# Patient Record
Sex: Female | Born: 1950 | Race: White | Hispanic: No | Marital: Single | Smoking: Never smoker
Health system: Southern US, Community
[De-identification: ages and names within clinical notes are randomized; demographics above are authoritative.]

## PROBLEM LIST (undated history)

## (undated) DIAGNOSIS — I519 Heart disease, unspecified: Secondary | ICD-10-CM

## (undated) DIAGNOSIS — E1122 Type 2 diabetes mellitus with diabetic chronic kidney disease: Secondary | ICD-10-CM

## (undated) DIAGNOSIS — H35039 Hypertensive retinopathy, unspecified eye: Secondary | ICD-10-CM

## (undated) DIAGNOSIS — E1140 Type 2 diabetes mellitus with diabetic neuropathy, unspecified: Secondary | ICD-10-CM

## (undated) DIAGNOSIS — E119 Type 2 diabetes mellitus without complications: Secondary | ICD-10-CM

## (undated) DIAGNOSIS — J45909 Unspecified asthma, uncomplicated: Secondary | ICD-10-CM

## (undated) DIAGNOSIS — I10 Essential (primary) hypertension: Secondary | ICD-10-CM

## (undated) DIAGNOSIS — M1990 Unspecified osteoarthritis, unspecified site: Secondary | ICD-10-CM

## (undated) DIAGNOSIS — N2889 Other specified disorders of kidney and ureter: Secondary | ICD-10-CM

## (undated) DIAGNOSIS — L97519 Non-pressure chronic ulcer of other part of right foot with unspecified severity: Secondary | ICD-10-CM

## (undated) DIAGNOSIS — E785 Hyperlipidemia, unspecified: Secondary | ICD-10-CM

## (undated) DIAGNOSIS — E1165 Type 2 diabetes mellitus with hyperglycemia: Secondary | ICD-10-CM

## (undated) DIAGNOSIS — L97509 Non-pressure chronic ulcer of other part of unspecified foot with unspecified severity: Secondary | ICD-10-CM

## (undated) DIAGNOSIS — E1129 Type 2 diabetes mellitus with other diabetic kidney complication: Secondary | ICD-10-CM

## (undated) DIAGNOSIS — E11319 Type 2 diabetes mellitus with unspecified diabetic retinopathy without macular edema: Secondary | ICD-10-CM

## (undated) DIAGNOSIS — G709 Myoneural disorder, unspecified: Secondary | ICD-10-CM

## (undated) DIAGNOSIS — IMO0002 Reserved for concepts with insufficient information to code with codable children: Secondary | ICD-10-CM

## (undated) DIAGNOSIS — I472 Ventricular tachycardia: Secondary | ICD-10-CM

## (undated) DIAGNOSIS — E1121 Type 2 diabetes mellitus with diabetic nephropathy: Secondary | ICD-10-CM

## (undated) DIAGNOSIS — K219 Gastro-esophageal reflux disease without esophagitis: Secondary | ICD-10-CM

## (undated) DIAGNOSIS — J189 Pneumonia, unspecified organism: Secondary | ICD-10-CM

## (undated) DIAGNOSIS — E11621 Type 2 diabetes mellitus with foot ulcer: Secondary | ICD-10-CM

## (undated) DIAGNOSIS — N183 Chronic kidney disease, stage 3 (moderate): Secondary | ICD-10-CM

## (undated) DIAGNOSIS — C801 Malignant (primary) neoplasm, unspecified: Secondary | ICD-10-CM

## (undated) DIAGNOSIS — D631 Anemia in chronic kidney disease: Secondary | ICD-10-CM

## (undated) DIAGNOSIS — Z992 Dependence on renal dialysis: Secondary | ICD-10-CM

## (undated) DIAGNOSIS — H269 Unspecified cataract: Secondary | ICD-10-CM

## (undated) DIAGNOSIS — N189 Chronic kidney disease, unspecified: Secondary | ICD-10-CM

## (undated) DIAGNOSIS — D649 Anemia, unspecified: Secondary | ICD-10-CM

## (undated) DIAGNOSIS — R809 Proteinuria, unspecified: Secondary | ICD-10-CM

## (undated) DIAGNOSIS — I2510 Atherosclerotic heart disease of native coronary artery without angina pectoris: Secondary | ICD-10-CM

## (undated) DIAGNOSIS — N289 Disorder of kidney and ureter, unspecified: Secondary | ICD-10-CM

## (undated) DIAGNOSIS — E133299 Other specified diabetes mellitus with mild nonproliferative diabetic retinopathy without macular edema, unspecified eye: Secondary | ICD-10-CM

## (undated) DIAGNOSIS — Z5189 Encounter for other specified aftercare: Secondary | ICD-10-CM

## (undated) DIAGNOSIS — F329 Major depressive disorder, single episode, unspecified: Secondary | ICD-10-CM

## (undated) DIAGNOSIS — C649 Malignant neoplasm of unspecified kidney, except renal pelvis: Secondary | ICD-10-CM

## (undated) DIAGNOSIS — F32A Depression, unspecified: Secondary | ICD-10-CM

## (undated) DIAGNOSIS — N186 End stage renal disease: Secondary | ICD-10-CM

## (undated) HISTORY — DX: End stage renal disease: N18.6

## (undated) HISTORY — DX: Type 2 diabetes mellitus with foot ulcer: E11.621

## (undated) HISTORY — DX: Type 2 diabetes mellitus with diabetic neuropathy, unspecified: E11.40

## (undated) HISTORY — DX: Type 2 diabetes mellitus with unspecified diabetic retinopathy without macular edema: E11.319

## (undated) HISTORY — DX: Anemia in chronic kidney disease: D63.1

## (undated) HISTORY — DX: Reserved for concepts with insufficient information to code with codable children: IMO0002

## (undated) HISTORY — DX: Chronic kidney disease, unspecified: N18.9

## (undated) HISTORY — PX: TOE AMPUTATION: SHX809

## (undated) HISTORY — DX: Hyperlipidemia, unspecified: E78.5

## (undated) HISTORY — PX: EYE SURGERY: SHX253

## (undated) HISTORY — DX: Type 2 diabetes mellitus with other diabetic kidney complication: E11.29

## (undated) HISTORY — DX: End stage renal disease: Z99.2

## (undated) HISTORY — DX: Chronic kidney disease, stage 3 (moderate): N18.3

## (undated) HISTORY — DX: Encounter for other specified aftercare: Z51.89

## (undated) HISTORY — DX: Atherosclerotic heart disease of native coronary artery without angina pectoris: I25.10

## (undated) HISTORY — DX: Essential (primary) hypertension: I10

## (undated) HISTORY — DX: Type 2 diabetes mellitus with hyperglycemia: E11.21

## (undated) HISTORY — DX: Malignant neoplasm of unspecified kidney, except renal pelvis: C64.9

## (undated) HISTORY — DX: Unspecified cataract: H26.9

## (undated) HISTORY — DX: Type 2 diabetes mellitus with hyperglycemia: E11.65

## (undated) HISTORY — DX: Hypertensive retinopathy, unspecified eye: H35.039

## (undated) HISTORY — DX: Malignant (primary) neoplasm, unspecified: C80.1

## (undated) HISTORY — DX: Type 2 diabetes mellitus with foot ulcer: L97.519

## (undated) HISTORY — DX: Non-pressure chronic ulcer of other part of unspecified foot with unspecified severity: L97.509

## (undated) HISTORY — PX: CATARACT EXTRACTION: SUR2

## (undated) HISTORY — DX: Other specified diabetes mellitus with mild nonproliferative diabetic retinopathy without macular edema, unspecified eye: E13.3299

## (undated) HISTORY — DX: Ventricular tachycardia: I47.2

## (undated) HISTORY — DX: Heart disease, unspecified: I51.9

## (undated) HISTORY — DX: Other specified disorders of kidney and ureter: N28.89

## (undated) HISTORY — DX: Proteinuria, unspecified: R80.9

## (undated) HISTORY — DX: Type 2 diabetes mellitus with diabetic chronic kidney disease: E11.22

---

## 1998-10-28 ENCOUNTER — Emergency Department (HOSPITAL_COMMUNITY): Admission: EM | Admit: 1998-10-28 | Discharge: 1998-10-28 | Payer: Self-pay | Admitting: Emergency Medicine

## 1998-10-28 ENCOUNTER — Encounter: Payer: Self-pay | Admitting: *Deleted

## 2000-06-24 ENCOUNTER — Encounter: Admission: RE | Admit: 2000-06-24 | Discharge: 2000-09-22 | Payer: Self-pay | Admitting: Family Medicine

## 2001-03-03 ENCOUNTER — Encounter (INDEPENDENT_AMBULATORY_CARE_PROVIDER_SITE_OTHER): Payer: Self-pay | Admitting: Specialist

## 2001-03-03 ENCOUNTER — Inpatient Hospital Stay (HOSPITAL_COMMUNITY): Admit: 2001-03-03 | Discharge: 2001-03-07 | Payer: Self-pay | Admitting: Family Medicine

## 2001-05-19 ENCOUNTER — Encounter (INDEPENDENT_AMBULATORY_CARE_PROVIDER_SITE_OTHER): Payer: Self-pay

## 2001-05-19 ENCOUNTER — Ambulatory Visit (HOSPITAL_COMMUNITY): Admission: RE | Admit: 2001-05-19 | Discharge: 2001-05-19 | Payer: Self-pay | Admitting: General Surgery

## 2001-07-23 ENCOUNTER — Encounter: Admission: RE | Admit: 2001-07-23 | Discharge: 2001-10-05 | Payer: Self-pay | Admitting: Internal Medicine

## 2001-10-14 ENCOUNTER — Encounter: Admission: RE | Admit: 2001-10-14 | Discharge: 2001-10-23 | Payer: Self-pay | Admitting: Internal Medicine

## 2001-11-04 ENCOUNTER — Encounter (HOSPITAL_BASED_OUTPATIENT_CLINIC_OR_DEPARTMENT_OTHER): Admission: RE | Admit: 2001-11-04 | Discharge: 2002-02-02 | Payer: Self-pay | Admitting: Orthopedic Surgery

## 2002-02-03 ENCOUNTER — Encounter (HOSPITAL_BASED_OUTPATIENT_CLINIC_OR_DEPARTMENT_OTHER): Admission: RE | Admit: 2002-02-03 | Discharge: 2002-02-05 | Payer: Self-pay | Admitting: Internal Medicine

## 2005-10-15 ENCOUNTER — Emergency Department (HOSPITAL_COMMUNITY): Admission: EM | Admit: 2005-10-15 | Discharge: 2005-10-15 | Payer: Self-pay | Admitting: Emergency Medicine

## 2006-12-12 ENCOUNTER — Inpatient Hospital Stay (HOSPITAL_COMMUNITY): Admission: EM | Admit: 2006-12-12 | Discharge: 2006-12-17 | Payer: Self-pay | Admitting: Emergency Medicine

## 2010-12-04 NOTE — H&P (Signed)
NAMEELLANORA, TANIS                 ACCOUNT NO.:  [id number]   MEDICAL RECORD NO.:  [id number]          PATIENT TYPE:  EMS   LOCATION:  MAJO                         FACILITY:  [facility]   PHYSICIAN:  Corinna L. Conley Canal, MDDATE OF BIRTH:  [birth year]   DATE OF ADMISSION:  12/12/2006  DATE OF DISCHARGE:                              HISTORY & PHYSICAL   Please note this is a STAT H&P, not a discharge summary.   CHIEF COMPLAINT:  Right foot redness and swelling.   HISTORY OF PRESENT ILLNESS:  Ms. Sollazzo is a pleasant 60 year old  diabetic patient of Dr. Harlan Stains, who also sees Dr. Blenda Mounts for a  foot ulcer.  She has had a right foot plantar ulcer for the past three  years.  She saw Dr. Blenda Mounts in his office two days ago.  X-rays were done  and a deep culture was taken.  I have a call into [facility] for the  identification and sensitivity.  They are to call me back with the  result.  The patient noticed that her entire right foot became more  swollen.  It also started involving her leg.  She has also had chills.  Her sugars have been higher than usual.  Apparently, she called Dr.  Erasmo Downer office and was told to present to the emergency room for IV  antibiotics.  I discussed the case with Dr. Blenda Mounts and he does not  recall that she had a fracture on x-ray that was taken in the office but  he does not have her chart in front of him currently.   PAST MEDICAL HISTORY:  1. Type 2 diabetes.  2. Asthma.  3. Obesity.  4. 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   MEDICATIONS:  1. Actos 30 mg a day.  2. Amaryl dose unknown.  3. Metformin 1,000 mg p.o. b.i.d.  4. Keflex and Cipro for three days.  5. She takes two inhalers one is a maintenance inhaler.  One is a      rescue inhaler but she does not know their names.   ALLERGIES:  PENICILLIN.   FAMILY HISTORY:  Her mother had diabetes.  Her father had heart disease.  Her brother has skin cancer.   And her other brother has a tumor behind  his eye.   SOCIAL HISTORY:  The patient does not smoke.  She drinks occasionally.  No drug abuse.  She is single and lives alone.  She is a Air traffic controller and  delivers newspapers.   PAST SURGICAL HISTORY:  As above.   REVIEW OF SYSTEMS:  As above otherwise negative.   PHYSICAL EXAMINATION:  VITAL SIGNS:  Her temperature is 98, blood  pressure 125/96, pulse 107, respiratory rate 18, oxygen saturation 93%  on room air.  GENERAL:  The patient is an obese, white female in no acute distress.  She is currently in the hallway and therefore, it is difficult to get a  full examination on her.  HEENT:  Normocephalic atraumatic.  Pupils are equal, round, and  reactive  to light.  She has moist mucous membranes.  NECK:  Supple.  She has a thick neck.  She has no carotid bruits.  No  thyromegaly.  No lymphadenopathy.  LUNGS:  Clear to auscultation bilaterally without wheezes, rhonchi, or  rales.  CARDIOVASCULAR:  Regular rate and rhythm without murmurs, gallops, or  rubs.  ABDOMEN:  Obese, soft, nontender.  GU:  Deferred.  RECTAL:  Deferred.  EXTREMITIES:  Her right foot has an ulcer at the plantar surface just  proximal to the great toe which appears clean and looks to go down to  bone.  There is no drainage.  She has swelling and redness and warmth  involving her entire foot as well as her distal leg.  There is also  erythema that tracks up medially to near the groin.  SKIN:  See above.  No rash.  PSYCHIATRIC:  Normal affect.  NEUROLOGIC:  Alert and oriented.  Cranial nerve and sensory motor exams  are intact.   LABORATORY:  CBC is unremarkable.  Basic metabolic panel is significant  for a glucose of 264, otherwise unremarkable.  X-ray of the right foot,  wet reading shows an acute fracture of the great toe at the proximal  phalanx extending to the articular surface.  Official report is pending.   ASSESSMENT/PLAN:  1. Right diabetic foot ulcer  with fracture of the great toe and      cellulitis of the foot and leg.  The patient is penicillin allergic      but will need broad spectrum antibiotics including coverage of      MRSA.  I will start vancomycin, ciprofloxacin, and Flagyl.  I have      discussed the case with Dr. Blenda Mounts who agrees to consult this      weekend.  I will get an MRI.  I suspect she has osteomyelitis.  She      will need to be strict non-weightbearing to that foot and it will      need to be elevated.  I will await a call back from [facility]      with respect to the wound culture that was done in Dr. Erasmo Downer      office.  2. Type 2 diabetes.  I will continue Actos and Metformin.  She does      not know her Amaryl dose.  I will start with 4 mg.  I will also      check a hemoglobin A1c.  3. Asthma.  She does not recall her inhaler but I will give Advair and      albuterol.  4. Obesity.  5. She will also get deep vein thrombosis prophylaxis.      Corinna L. Conley Canal, MD  Electronically Signed     CLS/MEDQ  D:  12/12/2006  T:  12/12/2006  Job:  [id number]   cc:   Emeline General. Dema Severin, M.D.  Harriet Masson, D.P.M.

## 2010-12-04 NOTE — Op Note (Signed)
NAME:  [name], [name] NO.:  [id number]   MEDICAL RECORD NO.:  [id number]          PATIENT TYPE:  INP   LOCATION:  6706                         FACILITY:  [facility]   PHYSICIAN:  Harriet Masson, D.P.M. DATE OF BIRTH:  March 08, 1951   DATE OF PROCEDURE:  12/16/2006  DATE OF DISCHARGE:                               OPERATIVE REPORT   SURGEON:  Harriet Masson, D.P.M.   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   PROCEDURE PERFORMED:  Plan for amputation, right great toe at the level  of the metatarsophalangeal joint.   INDICATIONS FOR PROCEDURE:  The patient has had a greater than 3-year  history of non resolving or recalcitrant ulceration of her right great  toe.  Most recently has had a flare-up with cellulitis extending up to  the foot and leg.  Has responded to the antibiotic; however, continues  to have severe edema and erythema with local calor to the right hallux  still being present.  There is still open ulceration approximately a  centimeter in diameter on the plantar aspect of the hallux under the IP  joint.  X-rays have confirmed pathologic fracturing of the IP joint, and  no history of trauma noted.  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  Surgery to proceed as scheduled.   ANESTHESIA:  Managed anesthesia care with local anesthetic and ankle  block provided by anesthesia.   HEMOSTASIS:  Use of right ankle tourniquet 250 mm Hg.   FINDINGS AND DESCRIPTION OF PROCEDURE:  The patient was brought into the  operating room and placed on the table in the supine  position.  IV  sedation was established.  A local anesthetic block had been given in  preop.  The foot was prepped and draped in the usual aseptic manner.  The ankle tourniquet was placed above the level of the malleoli and  padded well to prevent contusion.  The foot was exsanguinated via an  Esmarch wrap and the ankle tourniquet was inflated to 250 mm Hg.  The  following procedure was then carried down.  Again, the foot was  thoroughly prepped.   AMPUTATION OF THE FIRST TOE, RIGHT FOOT:  Attention was now directed to  the dorsomedial aspect of the right foot overlying the first MTP joint.  It showed no plantar ulceration.  The hallux was identified.  At this  time utilizing a tennis racquet circumferential incision, some of the  incisions were made from medial to lateral across the dorsum and across  the plantar aspect of the base of the hallux just proximal to the  ulcerative site.  At this time the incisions were deepened via sharp and  blunt dissection to the level of the underlying capsular structures and  neurovascular  bundles which were identified, incised and ligated  utilizing Bovie for cautery.  At this time a capsular incision was made  over the dorsal and medial aspects of the first MTP joint.  The joint  was entered and disarticulated and at this time the entire toe with  ulcer left intact was disarticulated and submitted in formalin for  pathology analysis.  The articular surface of the first metatarsal was  identified at this time.  Utilizing prior instrumentation, the articular  surface was resected perpendicular to its shaft.  All the rough edges  were smoothed and rongeured.  Should note the sesamoids and plantar  capsule were left intact and noted not to be excessively hypertrophied.   At this time the site was cultured, both aerobic and anaerobic cultures  of the operative site were carried out and to be compared with  preoperative culturing.  At this time the site  was thoroughly lavaged  utilizing the pulse lavage system.  Capsular tissues were then  reapproximated overlying the distal stump of the first metatarsal  utilizing 3-0 Vicryl and the skin reapproximated with 3-0 nylon in a  simple interrupted fashion.  Closure was done over a quarter-inch  Penrose drain which did exit medially along the incision area.  At this  time the site was thoroughly cleansed with Betadine.  A saline-soaked  sponge and a dry sterile compressive dressing were applied to the right  foot with the Penrose drain intact.  The right ankle tourniquet was  deflated with immediate return of perfusion to all the remaining lesser  digits.   The patient was returned from the operating room to recovery in  satisfactory condition and discharged back to her 6th floor on the  service of Dr. Dillard Essex for medical management.  The patient will  hopefully be able to be discharged with either IV or p.o. antibiotics  within the next day or two if recovery proceeds as expected.  Again, the  patient is stable, returning to recovery following the successful  surgery, amputation of the right hallux.           ______________________________  Harriet Masson, D.P.M.     RS/MEDQ  D:  12/16/2006  T:  12/16/2006  Job:  [id number]

## 2010-12-07 NOTE — Op Note (Signed)
[facility]  Patient:    [name], [name]                     MRN: [id number] Proc. Date: 03/04/01 Adm. Date:  [id number] Disc. Date: [id number] Attending:  Gelene Mink                           Operative Report  PREOPERATIVE DIAGNOSES:  Diabetes and gangrene of left great toe.  SURGICAL PROCEDURE:  Transphalangeal amputation of left great toe.  SURGEON:  Darene Lamer. Hoxworth, M.D.  ANESTHESIA:  General.  BRIEF HISTORY:  [name] is a 60 year old diabetic female, who presents with a several week history of ulceration on her left great toe and then foul-smelling drainage, redness, and swelling of her foot.  Examination revealed ulceration of essentially the entire pad of the left great toe with wet, gangrenous changes of proximal pad, extending down to the bone. Amputation of the toe with leaving the wound open has been recommended and accepted.  The nature of the procedure, its indications, and risks of nonhealing and further amputation have been discussed and understood with the patient and her family.  She is brought to the operating room for this procedure.  DESCRIPTION OF PROCEDURE:  The patient was brought to the operating room, placed in the supine position on the operating table, and general endotracheal anesthesia was induced.  The left foot was sterilely prepped and draped.  She was already on broad-spectrum antibiotics.  A fishmouth type of incision was made proximal to all of the gangrenous skin changes which was essentially the IP joint, and dissection was carried down sharply to the bone in all directions.  The distal phalanx was initially removed at the joint line and the specimen removed.  The soft tissue was sharply elevated up off the proximal phalanx which was then divided at about its mid portion with the bone cutter.  There were some further necrotic changes of the soft tissue beneath viable skin, extending along the  tendon sheath which was sharply debrided back to apparently viable tissue at approximately the MP joint.  The skin appeared healthy at this point.  The wound was copiously irrigated with warm saline. It was then packed open with moist saline gauze and dry sterile dressing applied.  Sponge, needle, and instrument counts were correct.  The patient was taken to recovery room in good condition. DD:  03/04/01 TD:  03/04/01 Job: [id number]

## 2010-12-07 NOTE — Consult Note (Signed)
[facility]  Patient:    [name], [name] Visit Number: [id number] MRN: [id number]          Service Type: FTC Location: FOOT Attending Physician:  Danny Lawless Dictated by:   Orlando Penner London Pepper, M.D. Proc. Date: 07/23/01 Admit Date:  07/23/2001   CC:         Marland Kitchen T. Hoxworth, M.D.  Emeline General Dema Severin, M.D., [facility]   Consultation Report  HISTORY:  This 60 year old white female is referred by Dr. Marland Kitchen T. Hoxworth for assistance with the management of a right foot ulcer.  The patient has had type 2 diabetes for a number of years and has had numerous foot problems related to that.  Although she is not aware of any time of active disease, there are deformities in both feet that suggest she has had some hindfoot Charcot-type process bilaterally.  In addition, she developed an ulceration and eventual osteomyelitis of the hallux on the left side and underwent amputation of the hallux and the distal phalanx of the second toe of that foot in August of this year.  Since November of this year, she had developed an ulcerated callus on the plantar aspect of the first metatarsal head of the right foot and this has not healed, despite topical treatments and also some oral antibiotics.  Recently, over the proceeding weekend while wearing some nondiabetic shoes, she developed a blister in the area of the arch of the right foot which has since unroofed itself.  She presents here now for management of these various problems.  PAST MEDICAL HISTORY:  Past medical history includes hypercholesterolemia, asthma, hypertension as well as those problems mentioned in the present illness.  ALLERGIES:  She is allergic to PENICILLIN.  REGULAR MEDICATIONS: 1. Glucophage XR 500 mg b.i.d. 2. Glucotrol XL 5 mg daily. 3. Lotensin 10 mg daily. 4. Lasix 20 mg daily. 5. Niacin uncertain dose, one daily. 6. Multivitamin one  daily.  EXAMINATION:  EXTREMITIES:  Examination today is limited to the distal lower extremities. As previously indicated, both feet show medial overriding in the talonavicular areas suggesting some previous hindfoot Charcot process, although there is no apparent activity to that at this time.  The feet are deformed by virtue of surgical amputation of the hallux and distal phalanx of the second toe of the left foot, now well-healed; in addition, there is some clawing of the toes. The feet are mildly edematous, more so on the right than on the left.  Pedal pulses are adequate.  Skin temperatures are normal and essentially symmetrical with exception of increased temperature on the right foot adjacent to the plantar ulcer and also to the superficial blistered area in the instep area. Both heels are involved by diffuse callus formation and there is a significant callus on the tip of the clawed left third toe as well.  Monofilament testing shows that she lacks protective sensation in the right foot and has marginally protective sensation on the left.  DISPOSITION: 1. The patient is given general instruction regarding foot care by video    instruction with nurse and physician reinforcement.  She seems relatively    naive about the significance of foot problems in the face of her diabetes    and does not appear eager to learn. 2. The callus on the tip of the left third toe is sharply pared away and in    fact, an underlying ulcer is revealed which measures 2 x 3 x 1 mm after  debridement. 3. The primary ulcer underlying the first metatarsal head on the right foot is    widely debrided, resulting in an ulcer 5.0 x 8.0 x 0.5 mm after    debridement. 4. The blistered first degree ulceration in the instep area of the right foot    does not require any immediate attention from the point of view of    debridement. 5. The heels bilaterally are dremeled by the nurse to deal with the diffuse     callus formation there. 6. Neosporin and a soft dressing are used to the ulcer on the tip of the left    third toe.  The ulcer and blistered areas on the right foot are both    treated with Bactroban, then covered with Allevyn pads and the right lower    extremity is placed into a total contact cast. 7. It is discussed with the patient that once we can get these lesions healed,    she will need custom-molded shoes bilaterally. 8. Followup visit will be to this clinic in six days. Dictated by:   Orlando Penner London Pepper, M.D. Attending Physician:  Danny Lawless DD:  07/23/01 TD:  07/23/01 Job: [id number] [id number]

## 2010-12-07 NOTE — Discharge Summary (Signed)
[name], [name]                 ACCOUNT NO.:  [id number]   MEDICAL RECORD NO.:  [id number]          PATIENT TYPE:  INP   LOCATION:  6706                         FACILITY:  [facility]   PHYSICIAN:  Sheila Oats, M.D.DATE OF BIRTH:  17-Apr-1951   DATE OF ADMISSION:  12/12/2006  DATE OF DISCHARGE:  12/17/2006                               DISCHARGE SUMMARY   DISCHARGE DIAGNOSES:  1. Diabetic foot ulcer with fractures, cellulitis, and ? osteomyelitis      -- status post amputation of right great toe at the metatarsal      phalangeal joint.  2. Diabetes mellitus.  3. History of asthma.  4. Obesity.  5. History of left great toe amputation and partial amputation of the      left second toe.   CONSULTATION:  Podiatry -- Harriet Masson, D.P.M.   PROCEDURES AND STUDIES:  1. MRI of the right foot -- fractures of the proximal and distal      phalanges of the great toe with fractures extending into the      interphalangeal joint and within the interphalangeal joint      effusion.  The diffuse edema signaled an enhancement within the      phalanges could be due to fractures or due to osteomyelitis,      surrounding soft tissue edema noted.  Extensive hallucis longus      tenosynovitis and old metatarsal fracture is noted.  2. Amputation of the right great toe at the metatarsal phalangeal      joint per Harriet Masson, D.P.M.   BRIEF HISTORY:  The patient is a 60 year old diabetic female who  presented with complaints of right foot, redness and swelling.  She  reported that she had had a right foot plantar ulcer for the past 3  years; and last saw Dr. Blenda Mounts in his office, 2 days prior to her  admission.  She reports that at that time, she had x-rays done and  cultures done as well.  The patient stated that she noticed that her  entire right foot became more swollen, and began to involve her leg as  well.  Also, she developed chills, and her sugars had been higher than  normal.  She  called Dr. Erasmo Downer office and was told to go to the ER for  IV antibiotics.   Please see the admission history and physical dictated on Dec 12, 2006  by Dr. Conley Canal for the full details of the admission physical, as well  as the laboratory data.   HOSPITAL COURSE BY PROBLEM LIST:  Problem #1:  RIGHT DIABETIC FOOT ULCER  WITH CELLULITIS AND PROBABLE OSTEOMYELITIS.  Upon admission, the patient  was started on empiric antibiotics including coverage for MRSA.  Dr.  Blenda Mounts was consulted and he saw the patient.  An MRI was also obtained  and the results are as stated above.  The patient was afebrile on  antibiotics in the hospital; and the erythema and swelling were  improving.  Dr. Blenda Mounts saw the patient and recommended amputation of the  right great toe; and after the  discussion with the patient, she agreed  to this procedure; and it was done on Dec 16, 2006.  The patient  tolerated the procedure well.  Cultures obtained at the office on Dec 10, 2006, showed Staphylococcus aureus that was sensitive to all the  antibiotics, except penicillin.  The IV antibiotics were discontinued;  and the patient discharged home on Keflex.  She was to follow up with  Dr. Blenda Mounts and also her primary care physician, Dr. Harlan Stains.   Problem #2:  DIABETES MELLITUS.  The patient had a hemoglobin A1c done;  and it was 10.5.  She was on a diabetic diet and also on Amaryl and  Actos as well as sliding-scale coverage and her Glucophage during her  hospital stay.  She is to continue this with close monitoring of her  blood sugars upon discharge.   Problem #3:  HISTORY OF ASTHMA.  Remained stable throughout the hospital  stay.  She was maintained on her bronchodilators.   DISCHARGE MEDICATIONS:  Patient to continue her Keflex, Actos, Amaryl,  and metformin, as previously, as well as her bronchodilators.   FOLLOWUP CARE:  1. Dr. Blenda Mounts as scheduled.  2. Dr. Harlan Stains in 2-4 weeks.   DISCHARGE  CONDITION:  Improved and stable.   DISCHARGE DIET:  Diabetic diet.   SPECIAL INSTRUCTIONS:  Nonweightbearing to the right foot.      Sheila Oats, M.D.  Electronically Signed     ACV/MEDQ  D:  03/05/2007  T:  03/05/2007  Job:  [id number]   cc:   [facility] General. Dema Severin, M.D.  Harriet Masson, D.P.M.

## 2010-12-07 NOTE — Discharge Summary (Signed)
[facility]  Patient:    [name], [name] Visit Number: [id number] MRN: [id number]          Service Type: MED Location: [phone] Attending Physician:  Joette Catching T Adm. Date:  [id number] Disc. Date: [id number]   CC:         [place] T. Hoxworth, M.D.   Discharge Summary  DISCHARGE DIAGNOSES: 1. Left great toe ulceration leading to ray amputation. 2. Type 2 diabetes mellitus, uncontrolled. 3. Allergy to penicillin. 4. Early osteomyelitis of the left great toe secondary to #1.  DISCHARGE MEDICATIONS: 1. Flagyl 500 mg one t.i.d. for a minimum of 14 days. 2. Tequin 400 mg one a day for a minimum of 14 days. 3. Glucophage 1000 mg one tablet b.i.d. 4. Glucotrol XL 10 mg q.d. 5. Lasix 20 mg q.d. 6. Lotensin 10 mg q.d. 7. Darvocet-N one every four hours as needed for pain.  FOLLOWUP:  The patient will follow up with Dr. Excell Seltzer in his office on Thursday or Friday following discharge.  [facility] will call the patient for followup in the office for determination of ultimate antibiotic course to be coordinated with the patients surgical team.  CONSULTATION:  Dr. Excell Seltzer, general surgical service.  PROCEDURE:  Amputation of left great toe on March 04, 2001, per Dr. Excell Seltzer.  HISTORY OF PRESENT ILLNESS:  [name] is a pleasant, 60 year old female with type 2 diabetes who presented to the hospital on the date of admission with ulceration of her left great toe.  She had been following with a podiatrist who had been aggressively debriding the ulcer and treating her with p.o. Keflex, but she continued to note marked swelling about the joint with redness of the lower part of her leg and first and second toes.  When she began to develop fevers and chills, she presented to the ER for further evaluation.  X-rays in the emergency room revealed evidence of early osteomyelitis of the left great toe and  she was therefore admitted for evaluation.  HOSPITAL COURSE:  #1 - LEFT GREAT TOE DIABETIC ULCER WITH OSTEOMYELITIS:  The patient was admitted to the hospital for treatment of early osteomyelitis. Due to her penicillin allergy, she was treated initially with IV clindamycin as well as IV Tequin.  General surgery was consulted.  Wound cultures were obtained which were positive for pansensitive Serratia marcescens.  The patient tolerated her IV antibiotics without difficulty.  Due to the extent of the infection as well as the early osteomyelitis, surgery service felt that it was most appropriate to proceed with amputation of the toe.  This was accomplished without difficulty on March 04, 2001.  Arterial duplex studies from July 2002, were obtained from [facility] [address] which revealed ABIs of 1.1 at the right posterior tibial measurement and 1.0 at the dorsalis pedis on the right.  Digital pressures on the right were measured at 76 mmHg with an index of 0.59. ABIs on the left were 1.1 at the posterior tibial and 1.0 at dorsalis pedis with digital pressure on the left at 90 mmHg with an index of 0.70. This was deemed to be adequate profusion for appropriate healing.  As a result, no further vascular studies were felt to be necessary.  In the postoperative phase, the patient was transitioned to p.o. antibiotics after informal consultation with the infectious disease doctor on call.  The patient tolerated p.o. antibiotics without difficulty.  Her postoperative period was without significant complication.  She rapidly began to notice decreased edema, erythema and complete resolution of fevers after amputation of her toe. The patient was discharged home on March 07, 2001, to complete a minimum of two weeks of p.o. Flagyl and Tequin therapy for her diabetic ulcer and early osteomyelitis.  Extension of antibiotic course will be based upon wound evaluation and progression of healing.  The patient is to  follow up in the short term with Dr. Excell Seltzer with wound reevaluation.  If he feels that it is necessary to continue her antibiotics, it is recommended that these two antibiotics continue up to completion of possibly two months as needed.  If the patients wound is healing well, however, and the patient is having no systemic symptoms then two weeks could very well be adequate therapy.  The patient will also follow up with her primary care physician at [facility] for reevaluation of diabetes and to also help in determination of extent of antibiotic course.  #2 - DIABETES MELLITUS:  During hospitalization, the patients diabetes was well-controlled on Glucotrol and Glucophage alone.  No titration of her antidiabetic medications was necessary.  The patient was counseled extensively as to the relation to strict blood sugar control and appropriate wound healing.  She was instructed that she should check her blood sugars at least twice a day for the four to three days immediately following her discharge and that should her blood sugars continually run greater than 150, she should contact her primary care physician for adjustment in diabetic medications. The patient was initiated on an ACE inhibitor for renal protective effect during this hospitalization.  She tolerated Altace well, but was titrated with Lotensin by the time of discharge for financial reasons.  She is initiated on a low dose of Lotensin at 10 mg q.d. and it would be desirable to titrate this upward as the patient is able to tolerate it.  #3 - WOUND CARE:  Arrangements were made through case management for the patient to have home health assistance in dressing changes every day.  This will be continued until such time the surgical team feels it is appropriate to discontinue this. DD:  03/07/01 TD:  03/09/01 Job: [id number]

## 2010-12-07 NOTE — Op Note (Signed)
[facility]  Patient:    [name], [name] Visit Number: [id number] MRN: [id number]          Service Type: DSU Location: DAY Attending Physician:  Excell Seltzer [place] Proc. Date: 05/19/01 Admit Date:  05/19/2001                             Operative Report  PREOPERATIVE DIAGNOSIS:  Osteomyelitis left second toe.  PREOPERATIVE DIAGNOSIS:  Osteomyelitis left second toe.  SURGICAL PROCEDURE:  Interphalangeal amputation left second toe.  SURGEON:  Darene Lamer. Hoxworth, M.D.  ANESTHESIA:  Laryngeal mask.  BRIEF HISTORY:  [name] is a 60 year old white female with diabetes, who presents with osteomyelitis and worsening drainage and tissue destruction, infection of the distal phalanx of the left second great toe.  Distal toe amputation has been recommended and accepted.  The nature of the procedure, indications, risks of bleeding, infection, and nonhealing were discussed and understood.  She is now brought to the operating room for this procedure.  DESCRIPTION OF OPERATION:  The patient brought to the operating room and placed in the supine position on the operating table.  Laryngeal mask general anesthesia was induced.  She had been given antibiotics intravenously.  The left foot was sterilely prepped and draped.  A fishmouth incision at the IP joint was made and dissection carried down through the soft tissue with cautery and the toe divided at the IP joint and the specimen removed. Elevator was used to expose the proximal phalanx, and the phalangeal head was removed with the bone cutter.  The edges were rounded and smoothed with a rongeur and file.  The tissue appeared quite healthy with good blood supply. The wound was irrigated and then closed with interrupted 4-0 nylon.  Sponge, needle, and instrument counts correct.  The patient taken to recovery in good condition. Attending Physician:  Marlan Palau DD:  05/19/01 TD:   05/20/01 Job: [id number]

## 2012-11-28 ENCOUNTER — Encounter (HOSPITAL_COMMUNITY): Payer: Self-pay | Admitting: *Deleted

## 2012-11-28 ENCOUNTER — Emergency Department (HOSPITAL_COMMUNITY)
Admission: EM | Admit: 2012-11-28 | Discharge: 2012-11-29 | Disposition: A | Discharge status: Home / Self Care | Payer: Self-pay | Attending: Emergency Medicine | Admitting: Emergency Medicine

## 2012-11-28 ENCOUNTER — Emergency Department (HOSPITAL_COMMUNITY): Payer: Self-pay

## 2012-11-28 DIAGNOSIS — J40 Bronchitis, not specified as acute or chronic: Secondary | ICD-10-CM

## 2012-11-28 DIAGNOSIS — R062 Wheezing: Secondary | ICD-10-CM | POA: Insufficient documentation

## 2012-11-28 DIAGNOSIS — E119 Type 2 diabetes mellitus without complications: Secondary | ICD-10-CM | POA: Insufficient documentation

## 2012-11-28 DIAGNOSIS — Z79899 Other long term (current) drug therapy: Secondary | ICD-10-CM | POA: Insufficient documentation

## 2012-11-28 DIAGNOSIS — Z880 Allergy status to penicillin: Secondary | ICD-10-CM | POA: Insufficient documentation

## 2012-11-28 DIAGNOSIS — Z794 Long term (current) use of insulin: Secondary | ICD-10-CM | POA: Insufficient documentation

## 2012-11-28 DIAGNOSIS — J209 Acute bronchitis, unspecified: Secondary | ICD-10-CM | POA: Insufficient documentation

## 2012-11-28 DIAGNOSIS — J45909 Unspecified asthma, uncomplicated: Secondary | ICD-10-CM | POA: Insufficient documentation

## 2012-11-28 DIAGNOSIS — I10 Essential (primary) hypertension: Secondary | ICD-10-CM | POA: Insufficient documentation

## 2012-11-28 HISTORY — DX: Type 2 diabetes mellitus without complications: E11.9

## 2012-11-28 HISTORY — DX: Essential (primary) hypertension: I10

## 2012-11-28 HISTORY — DX: Unspecified asthma, uncomplicated: J45.909

## 2012-11-28 LAB — CBC WITH DIFFERENTIAL/PLATELET
Basophils Absolute: 0 10*3/uL (ref 0.0–0.1)
Basophils Relative: 0 % (ref 0–1)
Eosinophils Absolute: 0.2 10*3/uL (ref 0.0–0.7)
Eosinophils Relative: 3 % (ref 0–5)
HCT: 37.2 % (ref 36.0–46.0)
Hemoglobin: 12.7 g/dL (ref 12.0–15.0)
Lymphocytes Relative: 22 % (ref 12–46)
Lymphs Abs: 1.5 10*3/uL (ref 0.7–4.0)
MCH: 31 pg (ref 26.0–34.0)
MCHC: 34.1 g/dL (ref 30.0–36.0)
MCV: 90.7 fL (ref 78.0–100.0)
Monocytes Absolute: 0.3 10*3/uL (ref 0.1–1.0)
Monocytes Relative: 5 % (ref 3–12)
Neutro Abs: 4.8 10*3/uL (ref 1.7–7.7)
Neutrophils Relative %: 71 % (ref 43–77)
Platelets: 183 10*3/uL (ref 150–400)
RBC: 4.1 MIL/uL (ref 3.87–5.11)
RDW: 13.2 % (ref 11.5–15.5)
WBC: 6.9 10*3/uL (ref 4.0–10.5)

## 2012-11-28 LAB — COMPREHENSIVE METABOLIC PANEL
ALT: 12 U/L (ref 0–35)
AST: 9 U/L (ref 0–37)
Albumin: 3.8 g/dL (ref 3.5–5.2)
Alkaline Phosphatase: 84 U/L (ref 39–117)
BUN: 30 mg/dL — ABNORMAL HIGH (ref 6–23)
CO2: 26 mEq/L (ref 19–32)
Calcium: 9.1 mg/dL (ref 8.4–10.5)
Chloride: 107 mEq/L (ref 96–112)
Creatinine, Ser: 1.46 mg/dL — ABNORMAL HIGH (ref 0.50–1.10)
GFR calc Af Amer: 43 mL/min — ABNORMAL LOW (ref >90)
GFR calc non Af Amer: 37 mL/min — ABNORMAL LOW (ref >90)
Glucose, Bld: 246 mg/dL — ABNORMAL HIGH (ref 70–99)
Potassium: 4.2 mEq/L (ref 3.5–5.1)
Sodium: 145 mEq/L (ref 135–145)
Total Bilirubin: 0.2 mg/dL — ABNORMAL LOW (ref 0.3–1.2)
Total Protein: 7.1 g/dL (ref 6.0–8.3)

## 2012-11-28 LAB — POCT I-STAT TROPONIN I: Troponin i, poc: 0 ng/mL (ref 0.00–0.08)

## 2012-11-28 MED ORDER — ALBUTEROL SULFATE (5 MG/ML) 0.5% IN NEBU
5.0000 mg | INHALATION_SOLUTION | Freq: Once | RESPIRATORY_TRACT | Status: AC
  Administered 2012-11-28 – 2012-11-29 (×2): 5 mg via RESPIRATORY_TRACT

## 2012-11-28 NOTE — ED Notes (Signed)
Intermittent sob for x 1. Been taking Proventil inhaler "but it seems to not be helping." Ran out of Proventil today. Audible wheezing. Productive cough - yellowish/brown. No fevers, chills.

## 2012-11-29 MED ORDER — IPRATROPIUM BROMIDE 0.02 % IN SOLN
RESPIRATORY_TRACT | Status: AC
  Administered 2012-11-29: 0.5 mg via RESPIRATORY_TRACT
  Filled 2012-11-29: qty 2.5

## 2012-11-29 MED ORDER — ALBUTEROL SULFATE (5 MG/ML) 0.5% IN NEBU
INHALATION_SOLUTION | RESPIRATORY_TRACT | Status: AC
  Administered 2012-11-29: 5 mg via RESPIRATORY_TRACT
  Filled 2012-11-29: qty 1

## 2012-11-29 MED ORDER — ALBUTEROL SULFATE HFA 108 (90 BASE) MCG/ACT IN AERS: 1.0000 | INHALATION_SPRAY | Freq: Four times a day (QID) | RESPIRATORY_TRACT | Status: DC | PRN

## 2012-11-29 MED ORDER — ALBUTEROL SULFATE HFA 108 (90 BASE) MCG/ACT IN AERS
2.0000 | INHALATION_SPRAY | Freq: Four times a day (QID) | RESPIRATORY_TRACT | Status: DC
  Administered 2012-11-29: 2 via RESPIRATORY_TRACT
  Filled 2012-11-29: qty 6.7

## 2012-11-29 MED ORDER — PREDNISONE 10 MG PO TABS: 40.0000 mg | ORAL_TABLET | Freq: Every day | ORAL | Status: DC

## 2012-11-29 MED ORDER — PREDNISONE 20 MG PO TABS
60.0000 mg | ORAL_TABLET | Freq: Once | ORAL | Status: AC
  Administered 2012-11-29: 60 mg via ORAL
  Filled 2012-11-29: qty 3

## 2012-11-29 MED ORDER — ALBUTEROL SULFATE (5 MG/ML) 0.5% IN NEBU: 5.0000 mg | INHALATION_SOLUTION | Freq: Once | RESPIRATORY_TRACT | Status: DC

## 2012-11-29 MED ORDER — IPRATROPIUM BROMIDE 0.02 % IN SOLN
0.5000 mg | Freq: Once | RESPIRATORY_TRACT | Status: AC
  Administered 2012-11-29: 0.5 mg via RESPIRATORY_TRACT

## 2012-11-29 NOTE — ED Notes (Signed)
The patient is AOx4 and comfortable with her discharge instructions. 

## 2012-11-29 NOTE — ED Provider Notes (Signed)
History     CSN: [id number]  Arrival date & time 11/28/12  2237   First MD Initiated Contact with Patient 11/28/12 2347      Chief Complaint  Patient presents with  . Shortness of Breath    (Consider location/radiation/quality/duration/timing/severity/associated sxs/prior treatment) Patient is a 62 y.o. female presenting with shortness of breath. The history is provided by the patient.  Shortness of Breath Associated symptoms: wheezing   Associated symptoms: no abdominal pain, no chest pain, no fever, no headaches and no rash    Patient with intermittent shortness of breath for about a month. Gotten worse here in the past few days. Patient been using her Proventil inhaler quite frequently and then ran out today breathing got significantly worse had audible wheezing in the waiting room productive cough yellowish brown no fever no chills. Patient's oxygen saturation out in triage was 88% on room air. Patient does not use oxygen at home.  Past Medical History  Diagnosis Date  . Asthma   . Hypertension   . Diabetes mellitus without complication     Past Surgical History  Procedure Laterality Date  . Toe amputation      No family history on file.  History  Substance Use Topics  . Smoking status: Never Smoker   . Smokeless tobacco: Not on file  . Alcohol Use: No    OB History   Grav Para Term Preterm Abortions TAB SAB Ect Mult Living                  Review of Systems  Constitutional: Negative for fever.  HENT: Negative for congestion.   Eyes: Negative for visual disturbance.  Respiratory: Positive for shortness of breath and wheezing.   Cardiovascular: Negative for chest pain.  Gastrointestinal: Negative for abdominal pain.  Genitourinary: Negative for dysuria.  Musculoskeletal: Negative for back pain.  Skin: Negative for rash.  Neurological: Negative for headaches.  Hematological: Does not bruise/bleed easily.  Psychiatric/Behavioral: Negative for confusion.     Allergies  Penicillins  Home Medications   Current Outpatient Rx  Name  Route  Sig  Dispense  Refill  . glimepiride (AMARYL) 4 MG tablet   Oral   Take 4 mg by mouth 2 (two) times daily before a meal.         . insulin NPH-regular (NOVOLIN 70/30) (70-30) 100 UNIT/ML injection   Subcutaneous   Inject 50 Units into the skin 2 (two) times daily with a meal.         . lisinopril (PRINIVIL,ZESTRIL) 5 MG tablet   Oral   Take 5 mg by mouth daily.         . metFORMIN (GLUCOPHAGE) 1000 MG tablet   Oral   Take 1,000 mg by mouth 2 (two) times daily with a meal.         . STUDY MEDICATION   Subcutaneous   Inject 18 Units into the skin daily. Victoza versus placebo study         . albuterol (PROVENTIL HFA;VENTOLIN HFA) 108 (90 BASE) MCG/ACT inhaler   Inhalation   Inhale 1-2 puffs into the lungs every 6 (six) hours as needed for wheezing.   1 Inhaler   2   . predniSONE (DELTASONE) 10 MG tablet   Oral   Take 4 tablets (40 mg total) by mouth daily.   20 tablet   0     BP 208/105  Pulse 97  Temp(Src) 98.5 F (36.9 C) (Oral)  Resp 22  Ht 5\' 7"  (1.702 m)  Wt 250 lb (113.399 kg)  BMI 39.15 kg/m2  SpO2 94%  Physical Exam  Nursing note and vitals reviewed. Constitutional: She is oriented to person, place, and time. She appears well-developed and well-nourished. No distress.  HENT:  Head: Normocephalic and atraumatic.  Mouth/Throat: Oropharynx is clear and moist.  Eyes: Conjunctivae and EOM are normal. Pupils are equal, round, and reactive to light.  Neck: Normal range of motion. Neck supple.  Cardiovascular: Normal rate, regular rhythm and normal heart sounds.   No murmur heard. Pulmonary/Chest: Effort normal. She has wheezes.  Abdominal: Soft. Bowel sounds are normal. There is no tenderness.  Neurological: She is alert and oriented to person, place, and time. No cranial nerve deficit. She exhibits normal muscle tone. Coordination normal.  Skin: Skin is warm.  No rash noted.    ED Course  Procedures (including critical care time)  Labs Reviewed  COMPREHENSIVE METABOLIC PANEL - Abnormal; Notable for the following:    Glucose, Bld 246 (*)    BUN 30 (*)    Creatinine, Ser 1.46 (*)    Total Bilirubin 0.2 (*)    GFR calc non Af Amer 37 (*)    GFR calc Af Amer 43 (*)    All other components within normal limits  CBC WITH DIFFERENTIAL  POCT I-STAT TROPONIN I   Dg Chest 2 View  11/28/2012  *RADIOLOGY REPORT*  Clinical Data: Intermittent shortness of breath.  CHEST - 2 VIEW  Comparison: None.  Findings: Lungs are clear.  Heart size is normal.  No pneumothorax or pleural effusion.  IMPRESSION: No acute disease.   Original Report Authenticated By: Orlean Patten, M.D.      1. Bronchitis   2. Asthma       MDM  Patient's the breathing much improved in the emergency department after having albuterol nebulizer x2 patient also received IV Medrol 125 mg. Patient was in the some respiratory distress and low oxygen saturations upon arrival that improved significantly after the first nebulizer patient is still having some expiratory wheezing after the second nebulizer inside Medrol patient even feels better still has some bilateral expiratory wheezing but overall marked improved oxygen saturation on room air are now of 90-93%. Patient was satting 88% out in the waiting room prior treatment.  Chest x-ray was negative for pneumonia pulmonary edema or pneumothorax.        Mervin Kung, MD 11/29/12 [phone]

## 2013-05-05 ENCOUNTER — Ambulatory Visit (INDEPENDENT_AMBULATORY_CARE_PROVIDER_SITE_OTHER): Payer: Self-pay

## 2013-05-05 VITALS — BP 164/92 | HR 86 | Temp 97.6°F | Resp 12 | Ht 67.0 in | Wt 250.0 lb

## 2013-05-05 DIAGNOSIS — E1149 Type 2 diabetes mellitus with other diabetic neurological complication: Secondary | ICD-10-CM

## 2013-05-05 DIAGNOSIS — E1140 Type 2 diabetes mellitus with diabetic neuropathy, unspecified: Secondary | ICD-10-CM

## 2013-05-05 DIAGNOSIS — E1142 Type 2 diabetes mellitus with diabetic polyneuropathy: Secondary | ICD-10-CM

## 2013-05-05 DIAGNOSIS — L97509 Non-pressure chronic ulcer of other part of unspecified foot with unspecified severity: Secondary | ICD-10-CM

## 2013-05-05 MED ORDER — SILVER SULFADIAZINE 1 % EX CREA: TOPICAL_CREAM | Freq: Every day | CUTANEOUS | Status: DC

## 2013-05-05 NOTE — Progress Notes (Signed)
  Subjective:    Patient ID: [name], female    DOB: [birth year], 62 y.o.   MRN: [id number]  HPI Comments: ''THE RT FOOT IS DOING OK, BUT HAVE A LITTLE DRAINIGE''     Review of Systems  Cardiovascular: Positive for leg swelling.  Neurological: Positive for numbness.  All other systems reviewed and are negative.       Objective:   Physical Exam  Constitutional: She appears well-developed and well-nourished.  Cardiovascular:  Pulses:      Dorsalis pedis pulses are 2+ on the right side, and 2+ on the left side.       Posterior tibial pulses are 0 on the right side, and 1+ on the left side.  Capillary refill timed 3-4 seconds all digits bilateral. skin temperature warm. There is +1 posterior edema right ankle assessment in the left mild varicosities noted bilateral.  Musculoskeletal:  Promontory changes of both feet noted bilateral with Charcot-type changes of the right ankle more significant than left. Right great toe has been amputated. The left hallux and second digit had also been amputated. Remaining digits have semirigid digital contractures begin with significant arthritic changes of both feet. Ambulates and crocs most the time.  Neurological: She is alert. She has normal strength.  Grossly diminished epicritic and proprioceptive sensations bilateral in her feet also and numbness in her hands. DTRs not elicited  Skin: Skin is warm and dry. No cyanosis. Nails show no clubbing.  Skin color and pigment are normal bilateral. There is absent hair growth. There is no active ulcer sub-first MTP right foot. 1.5 x 1.0 cm with 0.5 cm there is a good pink granular base to this ulceration. They're surrounding macerated hyperkeratoses noted. No purulent discharge or drainage no ascending cellulitis or lymphangitis. No malodor. No increased temperature to  Psychiatric: Her behavior is normal.          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 The ulcer is decreased in size since last visit. Appears to be stable no active infection at this time. Has completed her antibiotic regimen. The ulcer is debrided of necrotic and hyperkeratotic tissue. Silvadene and gauze dressing applied. New prescription for Silvadene is as prescribed. followup in 2-3 weeks for ulcer care. Next  Harriet Masson DPM

## 2013-05-05 NOTE — Patient Instructions (Signed)

## 2013-05-14 ENCOUNTER — Other Ambulatory Visit: Payer: Self-pay

## 2013-05-14 ENCOUNTER — Telehealth: Payer: Self-pay | Admitting: *Deleted

## 2013-05-14 DIAGNOSIS — L02619 Cutaneous abscess of unspecified foot: Secondary | ICD-10-CM

## 2013-05-14 MED ORDER — CLINDAMYCIN HCL 150 MG PO CAPS: 150.0000 mg | ORAL_CAPSULE | Freq: Three times a day (TID) | ORAL | Status: DC

## 2013-05-14 NOTE — Telephone Encounter (Signed)
Dr Blenda Mounts ordered Clindamycin electronically sent to [facility].  Informed pt of orders to [facility] and pt states she price checks and that medication cheaper at [facility].  Cancelled Clindamycin to [facility] and reordered with [facility] electronically.

## 2013-05-14 NOTE — Telephone Encounter (Signed)
Pt complains of murky drainage from foot, request antibiotic.

## 2013-05-25 ENCOUNTER — Ambulatory Visit (INDEPENDENT_AMBULATORY_CARE_PROVIDER_SITE_OTHER): Payer: Self-pay

## 2013-05-25 VITALS — BP 165/95 | HR 89 | Temp 98.8°F | Resp 24 | Ht 67.0 in | Wt 250.0 lb

## 2013-05-25 DIAGNOSIS — L97509 Non-pressure chronic ulcer of other part of unspecified foot with unspecified severity: Secondary | ICD-10-CM

## 2013-05-25 DIAGNOSIS — E1140 Type 2 diabetes mellitus with diabetic neuropathy, unspecified: Secondary | ICD-10-CM

## 2013-05-25 DIAGNOSIS — E1149 Type 2 diabetes mellitus with other diabetic neurological complication: Secondary | ICD-10-CM

## 2013-05-25 DIAGNOSIS — L02619 Cutaneous abscess of unspecified foot: Secondary | ICD-10-CM

## 2013-05-25 DIAGNOSIS — E1142 Type 2 diabetes mellitus with diabetic polyneuropathy: Secondary | ICD-10-CM

## 2013-05-25 MED ORDER — CLINDAMYCIN HCL 150 MG PO CAPS: 150.0000 mg | ORAL_CAPSULE | Freq: Three times a day (TID) | ORAL | Status: DC

## 2013-05-25 NOTE — Progress Notes (Signed)
  Subjective:    Patient ID: [name], female    DOB: 03/20/1951, 62 y.o.   MRN: [id number] "It's not as good as it was previous time.  It hurt the last time after I left here.  Don't know if it was trimmed too much or what."   HPI there was some initial brown drainage is now gone back to a pink yellow drainage. Patient had some increased pain in the last week. That has since improved there is some slight increased temperature in the foot as well.    Review of Systems different at this     Objective:   Physical Exam No changes in history or medications neurovascular status unchanged. Good pink granular base to the ulcer however they're surrounding hemorrhagic keratoses and macerated tissue the ulcer measures 1.5 x 1.2 cm overall diameter with a half centimeter depth there is some slight warmth to the right foot as compared to the left no ascending cellulitis or lymphangitis is noted. No malodor is noted. Patient indicates at the start improvement she started on the clindamycin she is down to the last couple of pills.       Assessment & Plan:  Stable and improving ulceration with localized cellulitis. At this time a new prescription for clindamycin is dispensed will continue clindamycin for another 10 days. Recheck in 2 weeks for long-term followup. The ulcer is debrided down to subcutaneous tissue level Iodosorb and gauze dressing are applied at this time patient will continue with Silvadene and gauze dressings at home. Patient did demonstrate a sample of medication she got from her brother patient has a santyl ointment enzymatic topical. I suggest she may keep it for future use however does not needed at this time. Followup in 2 weeks as scheduled continue with local wound care Silvadene and gauze dressings  Harriet Masson DPM

## 2013-05-25 NOTE — Patient Instructions (Signed)

## 2013-06-09 ENCOUNTER — Ambulatory Visit: Payer: Self-pay

## 2013-06-22 ENCOUNTER — Ambulatory Visit (INDEPENDENT_AMBULATORY_CARE_PROVIDER_SITE_OTHER): Payer: Self-pay

## 2013-06-22 VITALS — BP 160/96 | HR 97 | Temp 99.1°F | Resp 12

## 2013-06-22 DIAGNOSIS — E1140 Type 2 diabetes mellitus with diabetic neuropathy, unspecified: Secondary | ICD-10-CM

## 2013-06-22 DIAGNOSIS — E1142 Type 2 diabetes mellitus with diabetic polyneuropathy: Secondary | ICD-10-CM

## 2013-06-22 DIAGNOSIS — L97509 Non-pressure chronic ulcer of other part of unspecified foot with unspecified severity: Secondary | ICD-10-CM

## 2013-06-22 DIAGNOSIS — E1149 Type 2 diabetes mellitus with other diabetic neurological complication: Secondary | ICD-10-CM

## 2013-06-22 HISTORY — DX: Type 2 diabetes mellitus with diabetic neuropathy, unspecified: E11.40

## 2013-06-22 HISTORY — DX: Non-pressure chronic ulcer of other part of unspecified foot with unspecified severity: L97.509

## 2013-06-22 NOTE — Progress Notes (Signed)
   Subjective:    Patient ID: [name], female    DOB: 28-Jan-1951, 62 y.o.   MRN: [id number]  HPI Comments: '' THE RT FOOT IS STILL HAVE DRAINIGE''     Review of Systems no changes     Objective:   Physical Exam Neurovascular status is intact as follows patient does have DP +2/4 PT pulse one over 4+ one edema noted bilateral. Epicritic and proprioceptive sensations grossly diminished on Semmes Weinstein testing. There appears to be pink base to it necrotic hemorrhage a keratotic ulceration the ulcer is out 1.5 x 1.4 cm overall diameter with about half centimeter depth. No ascending cellulitis lymphangitis. Patient has been on clindamycin although missed a couple weeks has not resume using clindamycin again. No malodor noted no ascending cellulitis no fever. Left foot is otherwise unremarkable at this time no new changes noted.       Assessment & Plan:  Assessment this time is diabetes with ulceration sub-first metatarsal area right foot patient advised to continue with the clindamycin at this time the ulcer debrided down to pinpoint bleeding to subcutaneous tissue level I does or gauze dressing are applied and will continue with Silvadene and gauze dressings at home daily advised to contact us is any change difficulties. Reappointed 3 weeks for followup and ulcer check  Harriet Masson DPM

## 2013-06-22 NOTE — Patient Instructions (Signed)
Instructions for Wound Care  The most important step to healing a foot wound is to reduce the pressure on your foot - it is extremely important to stay off your foot as much as possible and wear the shoe/boot as instructed.  Cleanse your foot with saline wash or warm soapy water (dial antibacterial soap or similar).  Blot dry.  Apply prescribed medication to your wound and cover with gauze and a bandage.  May hold bandage in place with Coban (self sticky wrap), Ace bandage or tape.  You may find dressing supplies at your local [employer], [employer], drug store or medical supply store.  Your prescribed topical medication is :  Silvadene Cream (twice daily) apply to affected area ulcer once daily with dressing change  [facility] [facility] [employer] is a mail order medical supply company that we use to provide some of our would care products.  If we use their service of you, you will receive the product by mail.  If you have not received the medication in 3 business days, please call our office.  If you notice any foul odor, increase in pain, pus, increased swelling, red streaks or generalized redness occurring in your foot or leg-Call our office immediately to be seen.  This may be a sign of a limb or life threatening infection that will need prompt attention.  Harriet Masson, [place]  [phone] [facility]  Diabetes and Exercise Exercising regularly is important. It is not just about losing weight. It has many health benefits, such as:  Improving your overall fitness, flexibility, and endurance.  Increasing your bone density.  Helping with weight control.  Decreasing your body fat.  Increasing your muscle strength.  Reducing stress and tension.  Improving your overall health. People with diabetes who exercise gain additional benefits because exercise:  Reduces appetite.  Improves the body's use of blood sugar (glucose).  Helps lower or control  blood glucose.  Decreases blood pressure.  Helps control blood lipids (such as cholesterol and triglycerides).  Improves the body's use of the hormone insulin by:  Increasing the body's insulin sensitivity.  Reducing the body's insulin needs.  Decreases the risk for heart disease because exercising:  Lowers cholesterol and triglycerides levels.  Increases the levels of good cholesterol (such as high-density lipoproteins [HDL]) in the body.  Lowers blood glucose levels. YOUR ACTIVITY PLAN  Choose an activity that you enjoy and set realistic goals. Your health care provider or diabetes educator can help you make an activity plan that works for you. You can break activities into 2 or 3 sessions throughout the day. Doing so is as good as one long session. Exercise ideas include:  Taking the dog for a walk.  Taking the stairs instead of the elevator.  Dancing to your favorite song.  Doing your favorite exercise with a friend. RECOMMENDATIONS FOR EXERCISING WITH TYPE 1 OR TYPE 2 DIABETES   Check your blood glucose before exercising. If blood glucose levels are greater than 240 mg/dL, check for urine ketones. Do not exercise if ketones are present.  Avoid injecting insulin into areas of the body that are going to be exercised. For example, avoid injecting insulin into:  The arms when playing tennis.  The legs when jogging.  Keep a record of:  Food intake before and after you exercise.  Expected peak times of insulin action.  Blood glucose levels before and after you exercise.  The type and amount of exercise you have done.  Review your records with your health care provider. Your health care provider will help you to develop guidelines for adjusting food intake and insulin amounts before and after exercising.  If you take insulin or oral hypoglycemic agents, watch for signs and symptoms of hypoglycemia. They  include:  Dizziness.  Shaking.  Sweating.  Chills.  Confusion.  Drink plenty of water while you exercise to prevent dehydration or heat stroke. Body water is lost during exercise and must be replaced.  Talk to your health care provider before starting an exercise program to make sure it is safe for you. Remember, almost any type of activity is better than none. Document Released: 09/28/2003 Document Revised: 03/10/2013 Document Reviewed: 12/15/2012 [facility] Patient Information 2014 [place].

## 2013-07-13 ENCOUNTER — Ambulatory Visit: Payer: Self-pay

## 2013-07-13 VITALS — BP 124/76 | HR 87 | Temp 98.7°F | Resp 12

## 2013-07-13 DIAGNOSIS — E1140 Type 2 diabetes mellitus with diabetic neuropathy, unspecified: Secondary | ICD-10-CM

## 2013-07-13 DIAGNOSIS — L97509 Non-pressure chronic ulcer of other part of unspecified foot with unspecified severity: Secondary | ICD-10-CM

## 2013-07-13 DIAGNOSIS — E1149 Type 2 diabetes mellitus with other diabetic neurological complication: Secondary | ICD-10-CM

## 2013-07-13 DIAGNOSIS — E1142 Type 2 diabetes mellitus with diabetic polyneuropathy: Secondary | ICD-10-CM

## 2013-07-13 MED ORDER — SILVER SULFADIAZINE 1 % EX CREA: TOPICAL_CREAM | Freq: Every day | CUTANEOUS | Status: DC

## 2013-07-13 NOTE — Patient Instructions (Signed)

## 2013-07-13 NOTE — Progress Notes (Signed)
   Subjective:    Patient ID: [name], female    DOB: Aug 21, 1950, 63 y.o.   MRN: [id number]  HPI Comments: ''RT FOOT STILL LOOKS THE SAME AND STILL DRAINING.''     Review of Systems no changes     Objective:   Physical Exam Vascular status is intact pedal pulses palpable DP postal for PT one over 4 bilateral there is some slight edema the right foot there is still ulcer proximally 1.8 x 1.5 cm overall size with hemorrhage a keratoses and maceration of the periphery moderate serous drainage is noted some increased range occurred this past week. 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 Knee Silvadene prescription is forward to pharmacy at this time. Followup in 2-3 weeks for continued ulcer care and ulcer check. Patient contact us immediately if there is any increase in symptoms fever chills or cellulitis for increased drainage were to occur.  Harriet Masson DPM

## 2013-07-27 ENCOUNTER — Ambulatory Visit: Payer: Self-pay

## 2013-08-03 ENCOUNTER — Ambulatory Visit: Payer: Self-pay

## 2013-08-17 ENCOUNTER — Ambulatory Visit (INDEPENDENT_AMBULATORY_CARE_PROVIDER_SITE_OTHER): Payer: Self-pay

## 2013-08-17 VITALS — BP 166/97 | HR 89 | Resp 16

## 2013-08-17 DIAGNOSIS — E1142 Type 2 diabetes mellitus with diabetic polyneuropathy: Secondary | ICD-10-CM

## 2013-08-17 DIAGNOSIS — E1149 Type 2 diabetes mellitus with other diabetic neurological complication: Secondary | ICD-10-CM

## 2013-08-17 DIAGNOSIS — L97509 Non-pressure chronic ulcer of other part of unspecified foot with unspecified severity: Secondary | ICD-10-CM

## 2013-08-17 DIAGNOSIS — E1140 Type 2 diabetes mellitus with diabetic neuropathy, unspecified: Secondary | ICD-10-CM

## 2013-08-17 MED ORDER — SILVER SULFADIAZINE 1 % EX CREA: TOPICAL_CREAM | Freq: Every day | CUTANEOUS | Status: DC

## 2013-08-17 NOTE — Patient Instructions (Signed)
ANTIBACTERIAL SOAP INSTRUCTIONS  THE DAY AFTER PROCEDURE  Please follow the instructions your doctor has marked.   Shower as usual. Before getting out, place a drop of antibacterial liquid soap (Dial) on a wet, clean washcloth.  Gently wipe washcloth over affected area.  Afterward, rinse the area with warm water.  Blot the area dry with a soft cloth and cover with antibiotic ointment (neosporin, polysporin, bacitracin) and band aid or gauze and tape  Place 3-4 drops of antibacterial liquid soap in a quart of warm tap water.  Submerge foot into water for 20 minutes.  If bandage was applied after your procedure, leave on to allow for easy lift off, then remove and continue with soak for the remaining time.  Next, blot area dry with a soft cloth and cover with a bandage.  Apply other medications as directed by your doctor, such as cortisporin otic solution (eardrops) or neosporin antibiotic ointment  Apply Silvadene cream and gauze dressing as instructed daily

## 2013-08-17 NOTE — Progress Notes (Signed)
   Subjective:    Patient ID: [name], female    DOB: 29-Mar-1951, 63 y.o.   MRN: [id number]  HPI Comments: "It looks the same, maybe a little bigger"     Review of Systems no new changes or findings noted patient did have a little or cold over the last couple weeks and is delayed her visit     Objective:   Physical Exam Neurovascular status is intact and unchanged pedal pulses palpable DP postal for PT plus one over 4 bilateral there continues to be ulceration of 1.5 x 1.2 cm sub-first MTP area right foot. There is hemorrhage and macerated keratoses around the periphery. No malodor no ascending cellulitis or lymphangitis is noted no secondary infection noted there is a pink granular base the central portion of the ulcer. Patient has profound neuropathy with absence of epicritic and proprioceptive sensations bilateral feet.       Assessment & Plan:  Diabetic neuropathic ulcer right foot sub-first MTP area stable does any changes posse somewhat larger. The ulcer site is debrided Silvadene gauze dressing applied patient lost her last prescription for Silvadene and use of the prescription is forwarded to the [facility] at this time with 2 refills. Recheck in 2-3 weeks for further followup and continued palliative care in the future as needed the ulcer is debrided down to subcutaneous tissue level at this time.  Harriet Masson DPM

## 2013-09-02 ENCOUNTER — Telehealth: Payer: Self-pay | Admitting: *Deleted

## 2013-09-02 DIAGNOSIS — L03119 Cellulitis of unspecified part of limb: Principal | ICD-10-CM

## 2013-09-02 DIAGNOSIS — L02619 Cutaneous abscess of unspecified foot: Secondary | ICD-10-CM

## 2013-09-02 MED ORDER — CLINDAMYCIN HCL 150 MG PO CAPS: 150.0000 mg | ORAL_CAPSULE | Freq: Three times a day (TID) | ORAL | Status: DC

## 2013-09-02 NOTE — Telephone Encounter (Signed)
Pt states the right foot is getting infected again, Dr Blenda Mounts said he'd call in an antibiotic.  Dr Noralyn Pick Clindamycin as ordered before.  Pt states call to [facility] Electronically.

## 2013-09-03 ENCOUNTER — Other Ambulatory Visit: Payer: Self-pay | Admitting: *Deleted

## 2013-09-03 DIAGNOSIS — L02619 Cutaneous abscess of unspecified foot: Secondary | ICD-10-CM

## 2013-09-03 DIAGNOSIS — L03119 Cellulitis of unspecified part of limb: Principal | ICD-10-CM

## 2013-09-03 MED ORDER — CLINDAMYCIN HCL 150 MG PO CAPS: 150.0000 mg | ORAL_CAPSULE | Freq: Three times a day (TID) | ORAL | Status: DC

## 2013-09-14 ENCOUNTER — Ambulatory Visit: Payer: Self-pay

## 2013-09-21 ENCOUNTER — Ambulatory Visit (INDEPENDENT_AMBULATORY_CARE_PROVIDER_SITE_OTHER): Payer: Self-pay

## 2013-09-21 VITALS — BP 164/79 | HR 92 | Resp 16

## 2013-09-21 DIAGNOSIS — E1142 Type 2 diabetes mellitus with diabetic polyneuropathy: Secondary | ICD-10-CM

## 2013-09-21 DIAGNOSIS — E1149 Type 2 diabetes mellitus with other diabetic neurological complication: Secondary | ICD-10-CM

## 2013-09-21 DIAGNOSIS — E1140 Type 2 diabetes mellitus with diabetic neuropathy, unspecified: Secondary | ICD-10-CM

## 2013-09-21 DIAGNOSIS — L97509 Non-pressure chronic ulcer of other part of unspecified foot with unspecified severity: Secondary | ICD-10-CM

## 2013-09-21 NOTE — Progress Notes (Signed)
   Subjective:    Patient ID: [name], female    DOB: 06-Mar-1951, 63 y.o.   MRN: [id number]  HPI Comments: "Its the usual, still there"  Follow up sub 1st MPJ right     Review of Systems no new changes or findings     Objective:   Physical Exam Neurovascular status is intact with pedal pulses palpable DP +2/4 PT plus one over 4 bilateral Refill timed 3-4 seconds or so by 1.5 x 1.4 cm ulceration sub-first MTP area right foot with macerated and hemorrhage keratoses were in the periphery this is debrided and then treated with lumicain and Silvadene and gauze dressing maintain Silvadene and gauze dressing we'll switch from soap and water to Epson salts or Betadine soaked help dry the area as there is increased maceration recently. Patient still taking antibiotic that was called in for her the other week advised to complete her antibiotic regimen there is no ascending cellulitis lymphangitis no increased temperature no fever chills.       Assessment & Plan:  Continued ulceration sub-1 right secondary to plantar flexed metatarsal diabetic neuropathy. The ulcer appears stable although somewhat macerated with continuous Epson salts and Silvadene dressings recheck in 3 weeks for followup  Harriet Masson DPM

## 2013-09-21 NOTE — Patient Instructions (Signed)
Betadine Soak Instructions  Purchase an 8 oz. bottle of BETADINE solution (Povidone)  THE DAY AFTER THE PROCEDURE  Place 1 tablespoon of betadine solution in a quart of warm tap water.  Submerge your foot or feet with outer bandage intact for the initial soak; this will allow the bandage to become moist and wet for easy lift off.  Once you remove your bandage, continue to soak in the solution for 20 minutes.  This soak should be done twice a day.  Next, remove your foot or feet from solution, blot dry the affected area and cover.  You may use a band aid large enough to cover the area or use gauze and tape.  Apply other medications to the area as directed by the doctor such as cortisporin otic solution (ear drops) or neosporin.  IF YOUR SKIN BECOMES IRRITATED WHILE USING THESE INSTRUCTIONS, IT IS OKAY TO SWITCH TO EPSOM SALTS AND WATER OR WHITE VINEGAR AND WATER.  Consider Betadine or Epsom salts soaks to dry the area more thoroughly do this for couple days and may resume soap and water cleansing as alternative

## 2013-10-12 ENCOUNTER — Ambulatory Visit: Payer: Self-pay

## 2013-10-20 ENCOUNTER — Ambulatory Visit: Payer: Self-pay

## 2013-11-01 ENCOUNTER — Ambulatory Visit (INDEPENDENT_AMBULATORY_CARE_PROVIDER_SITE_OTHER): Payer: Self-pay

## 2013-11-01 VITALS — BP 129/84 | HR 93 | Resp 18

## 2013-11-01 DIAGNOSIS — L03119 Cellulitis of unspecified part of limb: Secondary | ICD-10-CM

## 2013-11-01 DIAGNOSIS — E1142 Type 2 diabetes mellitus with diabetic polyneuropathy: Secondary | ICD-10-CM

## 2013-11-01 DIAGNOSIS — E1149 Type 2 diabetes mellitus with other diabetic neurological complication: Secondary | ICD-10-CM

## 2013-11-01 DIAGNOSIS — E1140 Type 2 diabetes mellitus with diabetic neuropathy, unspecified: Secondary | ICD-10-CM

## 2013-11-01 DIAGNOSIS — L02619 Cutaneous abscess of unspecified foot: Secondary | ICD-10-CM

## 2013-11-01 DIAGNOSIS — L97509 Non-pressure chronic ulcer of other part of unspecified foot with unspecified severity: Secondary | ICD-10-CM

## 2013-11-01 MED ORDER — CLINDAMYCIN HCL 150 MG PO CAPS: 150.0000 mg | ORAL_CAPSULE | Freq: Three times a day (TID) | ORAL | Status: DC

## 2013-11-01 NOTE — Patient Instructions (Signed)

## 2013-11-01 NOTE — Progress Notes (Signed)
   Subjective:    Patient ID: [name], female    DOB: [birth year], 63 y.o.   MRN: [id number]  HPI It does look better on my right foot and I did soak it in epsom salt     Review of Systems no new systemic changes or findings noted      Objective:   Physical Exam Neurovascular status intact DP postal for PT plus one over 4 bilateral no changes patient is status post amputation right great toe continues have ulceration of 1.5 x 2.0 cm sub-first right are some increased maceration and malodor and discharge drainage and last week or 2 patient is been doing Epson salt soaks with soap and water and Silvadene cream. Patient indications not on her feet a lot however may be having some sign of infection due to malodor notice at this time on palpation or some slight warmth to the right forefoot no deep sinus tract is identified does not probe down to bone or capsule there is a good pink granular base with hemorrhagic and macerated keratoses on the periphery this 2 cm ulcer.       Assessment & Plan:  Assessment diabetes with peripheral neuropathy and angiopathy patient does have ulceration with possible apply cellulitis right first MTP area. At this time the ulcer is debrided down to subcutaneous tissue level Iodosorb and gauze dressing are applied and will continue with Silvadene gauze dressing at home do Epson salt soaks warm soapy water soaks as instructed read issued a prescription for clindamycin 100 mg 3 times a day recheck in 2 weeks for followup contact us in changes or exacerbations were to occur  Harriet Masson DPM

## 2013-11-16 ENCOUNTER — Ambulatory Visit: Payer: Self-pay

## 2013-11-16 ENCOUNTER — Ambulatory Visit (INDEPENDENT_AMBULATORY_CARE_PROVIDER_SITE_OTHER): Payer: Self-pay

## 2013-11-16 VITALS — BP 176/104 | HR 97 | Resp 16

## 2013-11-16 DIAGNOSIS — E1140 Type 2 diabetes mellitus with diabetic neuropathy, unspecified: Secondary | ICD-10-CM

## 2013-11-16 DIAGNOSIS — E1142 Type 2 diabetes mellitus with diabetic polyneuropathy: Secondary | ICD-10-CM

## 2013-11-16 DIAGNOSIS — E1149 Type 2 diabetes mellitus with other diabetic neurological complication: Secondary | ICD-10-CM

## 2013-11-16 DIAGNOSIS — L97509 Non-pressure chronic ulcer of other part of unspecified foot with unspecified severity: Secondary | ICD-10-CM

## 2013-11-16 NOTE — Progress Notes (Signed)
   Subjective:    Patient ID: [name], female    DOB: Feb 24, 1951, 63 y.o.   MRN: [id number]  HPI Comments: "Its still there"  Foot Ulcer - Follow up sub 1st MPJ right foot     Review of Systems no systemic changes or findings noted     Objective:   Physical Exam Neurovascular status is intact pedal pulses DP +2/4 bilateral PT one over 4 bilateral no other changes in status post amputation both great toes over the ulcer site sub-1 right than on the metatarsal area continues to be drainage with mild maceration of the periphery is measured 1.5 x 2.3 cm in diameter down to subcutaneous tissue level serous drainage there is maceration and odor is improved patient still in her antibiotic regimen of clindamycin just a refill and will continue his clindamycin as instructed twice daily or 3 times daily. Patient will continue with changes and Silvadene gauze dressing daily       Assessment & Plan:  Assessment diabetes with peripheral neuropathy ulceration sub-1 right ulcer is debrided down to subcutaneous tissue level Iodosorb and gauze dressings applied and will maintain Silvadene and gauze dressings at home maintain antibiotic regimen refill just obtained recheck in 2 weeks for followup may be candidate for alternative dressings such as program are some skin substitute possibility or collagen dressing to help stimulate the healing process. Continue offloading is much as possible. Should note patient also indicates that she's got caught in the rain in her foot and hasn't gone away if you times since she was last seen this is certainly aggravating the situation patient advised to continue daily every protect as much as possible next  Harriet Masson DPM

## 2013-11-16 NOTE — Patient Instructions (Signed)
ANTIBACTERIAL SOAP INSTRUCTIONS  THE DAY AFTER PROCEDURE  Please follow the instructions your doctor has marked.   Shower as usual. Before getting out, place a drop of antibacterial liquid soap (Dial) on a wet, clean washcloth.  Gently wipe washcloth over affected area.  Afterward, rinse the area with warm water.  Blot the area dry with a soft cloth and cover with antibiotic ointment (neosporin, polysporin, bacitracin) and band aid or gauze and tape  Place 3-4 drops of antibacterial liquid soap in a quart of warm tap water.  Submerge foot into water for 20 minutes.  If bandage was applied after your procedure, leave on to allow for easy lift off, then remove and continue with soak for the remaining time.  Next, blot area dry with a soft cloth and cover with a bandage.  Apply other medications as directed by your doctor, such as cortisporin otic solution (eardrops) or neosporin antibiotic ointment  Cleanse daily maintain Silvadene and gauze dressing as instructed

## 2013-12-03 ENCOUNTER — Ambulatory Visit (INDEPENDENT_AMBULATORY_CARE_PROVIDER_SITE_OTHER): Payer: Self-pay

## 2013-12-03 VITALS — BP 150/89 | HR 84 | Resp 18

## 2013-12-03 DIAGNOSIS — L03119 Cellulitis of unspecified part of limb: Secondary | ICD-10-CM

## 2013-12-03 DIAGNOSIS — E1140 Type 2 diabetes mellitus with diabetic neuropathy, unspecified: Secondary | ICD-10-CM

## 2013-12-03 DIAGNOSIS — L97509 Non-pressure chronic ulcer of other part of unspecified foot with unspecified severity: Secondary | ICD-10-CM

## 2013-12-03 DIAGNOSIS — L02619 Cutaneous abscess of unspecified foot: Secondary | ICD-10-CM

## 2013-12-03 DIAGNOSIS — E1142 Type 2 diabetes mellitus with diabetic polyneuropathy: Secondary | ICD-10-CM

## 2013-12-03 DIAGNOSIS — E1149 Type 2 diabetes mellitus with other diabetic neurological complication: Secondary | ICD-10-CM

## 2013-12-03 MED ORDER — CLINDAMYCIN HCL 150 MG PO CAPS: 150.0000 mg | ORAL_CAPSULE | Freq: Four times a day (QID) | ORAL | Status: DC

## 2013-12-03 NOTE — Patient Instructions (Signed)
ANTIBACTERIAL SOAP INSTRUCTIONS  THE DAY AFTER PROCEDURE  Please follow the instructions your doctor has marked.   Shower as usual. Before getting out, place a drop of antibacterial liquid soap (Dial) on a wet, clean washcloth.  Gently wipe washcloth over affected area.  Afterward, rinse the area with warm water.  Blot the area dry with a soft cloth and cover with antibiotic ointment (neosporin, polysporin, bacitracin) and band aid or gauze and tape  Place 3-4 drops of antibacterial liquid soap in a quart of warm tap water.  Submerge foot into water for 20 minutes.  If bandage was applied after your procedure, leave on to allow for easy lift off, then remove and continue with soak for the remaining time.  Next, blot area dry with a soft cloth and cover with a bandage.  Apply other medications as directed by your doctor, such as cortisporin otic solution (eardrops) or neosporin antibiotic ointment  After washing and drying thoroughly apply Silvadene and gauze dressing as instructed

## 2013-12-03 NOTE — Progress Notes (Signed)
   Subjective:    Patient ID: [name], female    DOB: Mar 13, 1951, 63 y.o.   MRN: [id number]  HPI Comments: Same ole same ole, as long as i keep the bandage off it seems ok to me      Review of Systems no new systemic changes or findings noted     Objective:   Physical Exam Neurovascular status is intact and unchanged DP +2/4 PT plus one over 4 patient profound neuropathy there's been Willis drainage however exam the ulcer was still is about 2.4 x 1.6 cm nail has a central area of blistering down to the capsule with exposure the first metatarsal bone or capsule. The series debrided and a deep culture is taken down to the capsule or bone no definitive bone exposure is noted however that doesn't wound is to deeper and central portion of the ulcer with some necrotic tissue being debrided away at this time. Again patient profoundly neuropathic pedis pulse run out of her antibiotic needs refill at this time and will maintain her antibiotic and possibly just 2 months based on culture results if needed patient is been soaking in Epson salts and applying some Silvadene also sometimes using some Betadine.       Assessment & Plan:  Assessment this time is neuropathic diabetic ulceration sub-first metatarsal right patient's ulcer is blistered deeper to bone and first MTP joint capsule the cultures the capsule taken at this time will maintain antibiotic regimen recheck in 2 weeks for followup monitor for any fever chills or any exacerbations no ascending psoas lymphangitis is noted at this time. Recheck in 2 weeks for followup ulcers debrided aggressively at this time Iodosorb and gauze dressing is applied will maintain Silvadene gauze dressing. You Epson salts her Betadine soaks at home  [name] DPM

## 2013-12-06 LAB — WOUND CULTURE
Gram Stain: NONE SEEN
Gram Stain: NONE SEEN

## 2013-12-17 ENCOUNTER — Ambulatory Visit (INDEPENDENT_AMBULATORY_CARE_PROVIDER_SITE_OTHER): Payer: Self-pay

## 2013-12-17 VITALS — Temp 98.8°F | Resp 17 | Ht 68.0 in

## 2013-12-17 DIAGNOSIS — E1140 Type 2 diabetes mellitus with diabetic neuropathy, unspecified: Secondary | ICD-10-CM

## 2013-12-17 DIAGNOSIS — E1149 Type 2 diabetes mellitus with other diabetic neurological complication: Secondary | ICD-10-CM

## 2013-12-17 DIAGNOSIS — E1142 Type 2 diabetes mellitus with diabetic polyneuropathy: Secondary | ICD-10-CM

## 2013-12-17 DIAGNOSIS — L97509 Non-pressure chronic ulcer of other part of unspecified foot with unspecified severity: Secondary | ICD-10-CM

## 2013-12-17 NOTE — Patient Instructions (Signed)
ANTIBACTERIAL SOAP INSTRUCTIONS  THE DAY AFTER PROCEDURE  Please follow the instructions your doctor has marked.   Shower as usual. Before getting out, place a drop of antibacterial liquid soap (Dial) on a wet, clean washcloth.  Gently wipe washcloth over affected area.  Afterward, rinse the area with warm water.  Blot the area dry with a soft cloth and cover with antibiotic ointment (neosporin, polysporin, bacitracin) and band aid or gauze and tape  Place 3-4 drops of antibacterial liquid soap in a quart of warm tap water.  Submerge foot into water for 20 minutes.  If bandage was applied after your procedure, leave on to allow for easy lift off, then remove and continue with soak for the remaining time.  Next, blot area dry with a soft cloth and cover with a bandage.  Apply other medications as directed by your doctor, such as cortisporin otic solution (eardrops) or neosporin antibiotic ointment  Apply Silvadene and gauze dressing daily after washing soap and water thoroughly and drying maintain Silvadene and also as much as possible.

## 2013-12-17 NOTE — Progress Notes (Signed)
   Subjective:    Patient ID: [name], female    DOB: 08/26/1950, 63 y.o.   MRN: [id number]  HPI Comments: Pt presents for follow up of diabetic ulcer on right 1st MPJ.  Pt states had no drainage until today.     Review of Systems no new findings or systemic changes noted     Objective:   Physical Exam Neurovascular status is intact and unchanged DP +2/4 PT plus one over 4 bilateral capillary refill time 3 seconds dislocated the ulcer is almost: Then incised about 1.5 x 1.4 cm overall diameter section shrunk significantly was doing well however today his draining a little more than usual slight macerated hemorrhage a keratosis around the periphery. There's been no malodor no loosening side effects we increased her clindamycin to 4 times a day for 3 times a day seems to be doing better however there is also small blistered distal tuft of the amputated set hallux on the left foot which is also cleansed with all cleansed Silvadene and Band-Aid dressing is applied may have bumped are lacerated that toe in slight maceration or blister distal tuft of left hallux as well as ulcer right hallux been debrided and dressed with Silvadene and gauze dressing reappointed in 3-3 weeks       Assessment & Plan:  Assessment diabetes with peripheral neuropathy ulceration the ulcerations improve: Down in size detaining a bike regimen as indicated the ulcer is going on in size however still has been 3 keratosis from the periphery which is debrided followup in 2-3 weeks may be candidate for other alternative treatments consider possibly epi-fix if available. Again discussed alternatives a wound VAC would be a great alternative however without insurance patient cannot afford that modality.  Harriet Masson DPM

## 2014-01-05 ENCOUNTER — Ambulatory Visit: Payer: Self-pay

## 2014-01-18 ENCOUNTER — Ambulatory Visit (INDEPENDENT_AMBULATORY_CARE_PROVIDER_SITE_OTHER): Payer: Self-pay

## 2014-01-18 VITALS — BP 170/94 | HR 80 | Resp 12

## 2014-01-18 DIAGNOSIS — E1140 Type 2 diabetes mellitus with diabetic neuropathy, unspecified: Secondary | ICD-10-CM

## 2014-01-18 DIAGNOSIS — E1149 Type 2 diabetes mellitus with other diabetic neurological complication: Secondary | ICD-10-CM

## 2014-01-18 DIAGNOSIS — E1142 Type 2 diabetes mellitus with diabetic polyneuropathy: Secondary | ICD-10-CM

## 2014-01-18 DIAGNOSIS — L03119 Cellulitis of unspecified part of limb: Secondary | ICD-10-CM

## 2014-01-18 DIAGNOSIS — L97509 Non-pressure chronic ulcer of other part of unspecified foot with unspecified severity: Secondary | ICD-10-CM

## 2014-01-18 DIAGNOSIS — L02619 Cutaneous abscess of unspecified foot: Secondary | ICD-10-CM

## 2014-01-18 MED ORDER — CLINDAMYCIN HCL 150 MG PO CAPS: 150.0000 mg | ORAL_CAPSULE | Freq: Four times a day (QID) | ORAL | Status: DC

## 2014-01-18 NOTE — Progress Notes (Signed)
   Subjective:    Patient ID: [name], female    DOB: November 06, 1950, 63 y.o.   MRN: [id number]  HPI ''RT FOOT IS LOOKING A LITTLE BETTER.''   Review of Systems no new findings or systemic changes noted     Objective:   Physical Exam Neurovascular status is unchanged patient had amputated toe continues to have ulceration proximally 1.8 x 1.6 cm sub-first MTP area right foot. There is concern about hemorrhagic and macerated keratoses in nearly 4 weeks as patient had is debrided some increased drainage the last couple of days noted to no ascending cellulitis lymphangitis local erythema noted no fever chills noted no significant malodor noted however patient indicates she is out of her antibiotic and requesting a refill at this time. I concur that she would benefit from maintain its been a minus in for another 10 days . Remainder of exam unremarkable he did discuss the possible use of a total contact cast patient read about this in a magazine. I advised is a possibility however she would not be able to drive or do the activities that she is doing now since his her right leg and needs to be immobilized. Patient declined that option since she needs to maintain activities and take care of her animals.       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

## 2014-01-18 NOTE — Patient Instructions (Signed)

## 2014-02-01 ENCOUNTER — Ambulatory Visit: Payer: Self-pay

## 2014-02-15 ENCOUNTER — Ambulatory Visit (INDEPENDENT_AMBULATORY_CARE_PROVIDER_SITE_OTHER): Payer: Self-pay

## 2014-02-15 VITALS — BP 167/96 | HR 86 | Resp 12

## 2014-02-15 DIAGNOSIS — E1140 Type 2 diabetes mellitus with diabetic neuropathy, unspecified: Secondary | ICD-10-CM

## 2014-02-15 DIAGNOSIS — L97509 Non-pressure chronic ulcer of other part of unspecified foot with unspecified severity: Secondary | ICD-10-CM

## 2014-02-15 DIAGNOSIS — E1142 Type 2 diabetes mellitus with diabetic polyneuropathy: Secondary | ICD-10-CM

## 2014-02-15 DIAGNOSIS — E1149 Type 2 diabetes mellitus with other diabetic neurological complication: Secondary | ICD-10-CM

## 2014-02-15 NOTE — Patient Instructions (Signed)
ANTIBACTERIAL SOAP INSTRUCTIONS  THE DAY AFTER PROCEDURE  Please follow the instructions your doctor has marked.   Shower as usual. Before getting out, place a drop of antibacterial liquid soap (Dial) on a wet, clean washcloth.  Gently wipe washcloth over affected area.  Afterward, rinse the area with warm water.  Blot the area dry with a soft cloth and cover with antibiotic ointment (neosporin, polysporin, bacitracin) and band aid or gauze and tape  Place 3-4 drops of antibacterial liquid soap in a quart of warm tap water.  Submerge foot into water for 20 minutes.  If bandage was applied after your procedure, leave on to allow for easy lift off, then remove and continue with soak for the remaining time.  Next, blot area dry with a soft cloth and cover with a bandage.  Apply other medications as directed by your doctor, such as cortisporin otic solution (eardrops) or neosporin antibiotic ointment  After soaking drying thoroughly applying Silvadene and gauze dressing as instructed

## 2014-02-15 NOTE — Progress Notes (Signed)
   Subjective:    Patient ID: [name], female    DOB: [birth year], 63 y.o.   MRN: [id number]  HPI ''RT FOOT HAVE A LITTLE DRAINIGE AND STILL LOOK THE SAME.''   Review of Systems no new findings or systemic changes noted patient did have a slight interruption in her antibiotic however is been off it for a for 5 days will resume starting today.     Objective:   Physical Exam Neurovascular status is intact and unchanged pedal pulses palpable DP postal for PT one over 4 bilateral Refill time 3 seconds patient is status post amputation right hallux there is ulcer sub-first metatarsal head area on the right foot still measures about 1.8 x 1.5 cm with hemorrhagic and macerated keratoses around the periphery is some dried blood or discharge drainage noted within her dressings to no odor no lymphangitis no increased temperature no localized cellulitis is noted does not probe down to bone or capsule. Patient does have profound neuropathy hardness being up on her foot more recently he shouldn't also boarding dogs and working again stressed to offload her foot as much as possible       Assessment & Plan:  Assessment this time is diabetic neuropathic ulceration sub-first metatarsal right foot the ulcer is debrided down to subcutaneous tissue level sub-Iodosorb and gauze dressing are applied and will resume Silvadene and gauze dressings daily patient was also resume her clindamycin antibiotic the was discontinued she failed to pick up to prescription both because of cost and inability to get there has her car is no longer working. We'll reevaluate in 2-3 weeks for further followup and 18 daily dressing changes as instructed not as much as possible  Harriet Masson DPM

## 2014-03-01 ENCOUNTER — Ambulatory Visit: Payer: Self-pay | Admitting: Podiatry

## 2014-03-02 ENCOUNTER — Ambulatory Visit: Payer: Self-pay

## 2014-03-02 ENCOUNTER — Ambulatory Visit (INDEPENDENT_AMBULATORY_CARE_PROVIDER_SITE_OTHER): Payer: Self-pay

## 2014-03-02 VITALS — BP 184/92 | HR 86 | Resp 16

## 2014-03-02 DIAGNOSIS — L97509 Non-pressure chronic ulcer of other part of unspecified foot with unspecified severity: Secondary | ICD-10-CM

## 2014-03-02 DIAGNOSIS — E1140 Type 2 diabetes mellitus with diabetic neuropathy, unspecified: Secondary | ICD-10-CM

## 2014-03-02 DIAGNOSIS — E1149 Type 2 diabetes mellitus with other diabetic neurological complication: Secondary | ICD-10-CM

## 2014-03-02 DIAGNOSIS — E1142 Type 2 diabetes mellitus with diabetic polyneuropathy: Secondary | ICD-10-CM

## 2014-03-02 NOTE — Patient Instructions (Signed)
ANTIBACTERIAL SOAP INSTRUCTIONS  THE DAY AFTER PROCEDURE  Please follow the instructions your doctor has marked.   Shower as usual. Before getting out, place a drop of antibacterial liquid soap (Dial) on a wet, clean washcloth.  Gently wipe washcloth over affected area.  Afterward, rinse the area with warm water.  Blot the area dry with a soft cloth and cover with antibiotic ointment (neosporin, polysporin, bacitracin) and band aid or gauze and tape  Place 3-4 drops of antibacterial liquid soap in a quart of warm tap water.  Submerge foot into water for 20 minutes.  If bandage was applied after your procedure, leave on to allow for easy lift off, then remove and continue with soak for the remaining time.  Next, blot area dry with a soft cloth and cover with a bandage.  Apply other medications as directed by your doctor, such as cortisporin otic solution (eardrops) or neosporin antibiotic ointment  Wash and dry thoroughly apply Silvadene and gauze dressing as instructed

## 2014-03-02 NOTE — Progress Notes (Signed)
   Subjective:    Patient ID: [name], female    DOB: 02-Jun-1951, 63 y.o.   MRN: [id number]  HPI Comments: "It hasn't been draining as much"  Foot Ulcer - Follow up sub 1st MPJ right       Review of Systems no new findings or systemic changes noted    Objective:   Physical Exam Neurovascular status intact and unchanged pedal pulses are palpable DP and PT plus one over 4 bilateral edema is going down slightly there is minimal drainage and swelling the ulcer is down to a 1.3 x 1.5 cm with some hemorrhage a keratosis on the periphery middle maceration at this time no odor no lymphangitis no ascending cellulitis has one more week of antibiotic clindamycin advised to continue until gone again ulcer nonhealing are slow to heal patient has been off her foot more this week and it shows the foot appears to be better and less angry      Assessment & Plan:  Assessment this time diabetic neuropathic ulcer sub-first metatarsal right foot ulcer debrided down to subcutaneous tissue level Silvadene and gauze dressing reapplied continue with Silvadene gauze dressing daily recheck in 2-3 weeks for further followup advised to contact us with any changes or difficulties occur in the interim. Continue all antibiotics until completed  Harriet Masson DPM

## 2014-03-15 ENCOUNTER — Ambulatory Visit: Payer: Self-pay

## 2014-03-16 ENCOUNTER — Ambulatory Visit: Payer: Self-pay

## 2014-03-18 ENCOUNTER — Ambulatory Visit (INDEPENDENT_AMBULATORY_CARE_PROVIDER_SITE_OTHER): Payer: Self-pay

## 2014-03-18 VITALS — BP 173/92 | HR 86 | Resp 12

## 2014-03-18 DIAGNOSIS — L97509 Non-pressure chronic ulcer of other part of unspecified foot with unspecified severity: Secondary | ICD-10-CM

## 2014-03-18 DIAGNOSIS — E1140 Type 2 diabetes mellitus with diabetic neuropathy, unspecified: Secondary | ICD-10-CM

## 2014-03-18 DIAGNOSIS — E1142 Type 2 diabetes mellitus with diabetic polyneuropathy: Secondary | ICD-10-CM

## 2014-03-18 DIAGNOSIS — E1149 Type 2 diabetes mellitus with other diabetic neurological complication: Secondary | ICD-10-CM

## 2014-03-18 NOTE — Progress Notes (Signed)
   Subjective:    Patient ID: [name], female    DOB: 07-08-1951, 63 y.o.   MRN: [id number]  HPI ''rt foot have a little drainige.''   Review of Systems no new findings or systemic changes noted     Objective:   Physical Exam Neurovascular status intact and unchanged pedal pulses palpable DP and PT one over 4 mild edema noted ulcer still approximately 1.3 x 1.5 1.6 cm with about 2-3 mm depth mild hemorrhage a keratosis around the periphery slight maceration. There's been some bleeding dried blood or serous completing within the dressing no purulence no malodor no ascending cellulitis or lymphangitis no secondary infections again status post amputation of the right hallux the diabetes complications.       Assessment & Plan:  Assessment diabetes with neuropathic ulceration sub-first right the ulcer is debrided Silvadene gauze dressing reapplied recheck in 2-3 weeks for further followup continue offloading as much as possible moderate activity for possible  Harriet Masson DPM

## 2014-03-18 NOTE — Patient Instructions (Signed)
ANTIBACTERIAL SOAP INSTRUCTIONS  THE DAY AFTER PROCEDURE  Please follow the instructions your doctor has marked.   Shower as usual. Before getting out, place a drop of antibacterial liquid soap (Dial) on a wet, clean washcloth.  Gently wipe washcloth over affected area.  Afterward, rinse the area with warm water.  Blot the area dry with a soft cloth and cover with antibiotic ointment (neosporin, polysporin, bacitracin) and band aid or gauze and tape  Place 3-4 drops of antibacterial liquid soap in a quart of warm tap water.  Submerge foot into water for 20 minutes.  If bandage was applied after your procedure, leave on to allow for easy lift off, then remove and continue with soak for the remaining time.  Next, blot area dry with a soft cloth and cover with a bandage.  Apply other medications as directed by your doctor, such as cortisporin otic solution (eardrops) or neosporin antibiotic ointment  Wash and dry the foot ulcer site thoroughly apply Silvadene and gauze dressing daily

## 2014-04-05 ENCOUNTER — Ambulatory Visit: Payer: Self-pay

## 2014-04-12 ENCOUNTER — Ambulatory Visit (INDEPENDENT_AMBULATORY_CARE_PROVIDER_SITE_OTHER): Payer: Self-pay

## 2014-04-12 VITALS — BP 154/86 | HR 92 | Resp 12

## 2014-04-12 DIAGNOSIS — E1140 Type 2 diabetes mellitus with diabetic neuropathy, unspecified: Secondary | ICD-10-CM

## 2014-04-12 DIAGNOSIS — E1149 Type 2 diabetes mellitus with other diabetic neurological complication: Secondary | ICD-10-CM

## 2014-04-12 DIAGNOSIS — L97509 Non-pressure chronic ulcer of other part of unspecified foot with unspecified severity: Secondary | ICD-10-CM

## 2014-04-12 DIAGNOSIS — E1142 Type 2 diabetes mellitus with diabetic polyneuropathy: Secondary | ICD-10-CM

## 2014-04-12 NOTE — Progress Notes (Signed)
   Subjective:    Patient ID: [name], female    DOB: [birth year], 63 y.o.   MRN: [id number]  HPI patient presents for diabetic ulcer care followup    Review of Systems no new findings or systemic changes still ulcerated mild drainage is noted no secondary infection noted     Objective:   Physical Exam Neurovascular status is intact and unchanged pedal pulses DP +2 PT plus one over 4 capillary refill timed 3-4 seconds epicritic sensations grossly absent bilateral status post amputation right great toe still has a 1.5 x 1.2 cm ulceration sub-first MTP area right there is mild serous drainage no purulence some surrounding hemorrhagic and macerated keratoses which is debrided away this time. Left foot is otherwise unremarkable rigid digital contractures are identified no other open wounds no secondary infections no ascending cellulitis or lymphangitis noted       Assessment & Plan:  Assessment diabetic neuropathic ulcer sub-first right ulcer is debrided down to subcutaneous tissue level Silvadene and gauze dressing reapplied continue to offload and do daily dressing changes as instructed reappointed 2 weeks for further follow contact us in changes or exacerbations maintain Silvadene dressing change has not been about for about a month soak for no signs of infection or recurrence of infection noted next  Harriet Masson DPM

## 2014-04-12 NOTE — Patient Instructions (Signed)
ANTIBACTERIAL SOAP INSTRUCTIONS  THE DAY AFTER PROCEDURE  Please follow the instructions your doctor has marked.   Shower as usual. Before getting out, place a drop of antibacterial liquid soap (Dial) on a wet, clean washcloth.  Gently wipe washcloth over affected area.  Afterward, rinse the area with warm water.  Blot the area dry with a soft cloth and cover with antibiotic ointment (neosporin, polysporin, bacitracin) and band aid or gauze and tape  Place 3-4 drops of antibacterial liquid soap in a quart of warm tap water.  Submerge foot into water for 20 minutes.  If bandage was applied after your procedure, leave on to allow for easy lift off, then remove and continue with soak for the remaining time.  Next, blot area dry with a soft cloth and cover with a bandage.  Apply other medications as directed by your doctor, such as cortisporin otic solution (eardrops) or neosporin antibiotic ointment  Continue with daily cleansing soap and water dried thoroughly apply Silvadene and gauze

## 2014-04-26 ENCOUNTER — Ambulatory Visit (INDEPENDENT_AMBULATORY_CARE_PROVIDER_SITE_OTHER): Payer: Self-pay

## 2014-04-26 VITALS — BP 169/89 | HR 82 | Temp 98.3°F | Resp 15

## 2014-04-26 DIAGNOSIS — L97512 Non-pressure chronic ulcer of other part of right foot with fat layer exposed: Secondary | ICD-10-CM

## 2014-04-26 DIAGNOSIS — E1140 Type 2 diabetes mellitus with diabetic neuropathy, unspecified: Secondary | ICD-10-CM

## 2014-04-26 NOTE — Progress Notes (Signed)
   Subjective:    Patient ID: [name], female    DOB: 01-23-1951, 63 y.o.   MRN: [id number]  HPI Comments: Pt states she feels the right 1st MPJ is doing well.     Review of Systems no new findings or systemic changes noted     Objective:   Physical Exam Neurovascular status is unchanged pedal pulses DP +2 PT plus one over 4 bilateral capillary refill time 4 seconds. The ulcer appears stable mild serous bloody drainage is noted no purulence no malodor noted no ascending cellulitis noted the ulcer sub-1 right is still proxy 1.2 x 1.3 cm slightly decreased in size to the 5 mm or half centimeter in depth. Pink granular base with hemorrhage a keratoses around the periphery was some maceration around the periphery. No other changes noted history of previous dictation of toes diabetes complications with neuropathy and angiopathy.       Assessment & Plan:  Assessment diabetes with history peripheral neuropathy ulcer sub-1 right is debrided down to subcutaneous tissue level Silvadene and gauze dressing was reapplied recheck in 3 weeks for followup continued palliative care continue off as much as possible patient is been on her feet more other times advised to contact me change or exacerbations occur at  Harriet Masson DPM

## 2014-04-26 NOTE — Patient Instructions (Signed)
ANTIBACTERIAL SOAP INSTRUCTIONS  THE DAY AFTER PROCEDURE  Please follow the instructions your doctor has marked.   Shower as usual. Before getting out, place a drop of antibacterial liquid soap (Dial) on a wet, clean washcloth.  Gently wipe washcloth over affected area.  Afterward, rinse the area with warm water.  Blot the area dry with a soft cloth and cover with antibiotic ointment (neosporin, polysporin, bacitracin) and band aid or gauze and tape  Place 3-4 drops of antibacterial liquid soap in a quart of warm tap water.  Submerge foot into water for 20 minutes.  If bandage was applied after your procedure, leave on to allow for easy lift off, then remove and continue with soak for the remaining time.  Next, blot area dry with a soft cloth and cover with a bandage.  Apply other medications as directed by your doctor, such as cortisporin otic solution (eardrops) or neosporin antibiotic ointment  Continue with daily cleansing of wound an ulcer site apply Silvadene and gauze dressing as instructed

## 2014-05-17 ENCOUNTER — Ambulatory Visit (INDEPENDENT_AMBULATORY_CARE_PROVIDER_SITE_OTHER): Payer: Self-pay

## 2014-05-17 VITALS — BP 177/99 | HR 88 | Temp 98.5°F | Resp 15

## 2014-05-17 DIAGNOSIS — E1140 Type 2 diabetes mellitus with diabetic neuropathy, unspecified: Secondary | ICD-10-CM

## 2014-05-17 DIAGNOSIS — L97512 Non-pressure chronic ulcer of other part of right foot with fat layer exposed: Secondary | ICD-10-CM

## 2014-05-17 NOTE — Progress Notes (Signed)
   Subjective:    Patient ID: [name], female    DOB: [birth year], 63 y.o.   MRN: [id number]  HPI Comments: Pt states she has seen a little improvement in the ulcer on the right 1st MPJ plantar area.     Review of Systems no new findings or systemic changes noted     Objective:   Physical Exam Neurovascular status unchanged DP +2 PT welder for swelling gone down considerably on the right foot ulcers down to 1 cm in diameter down from 1.2 x 1.3 reduced in size about 3-4 mm in depth there is no purulence mild bloody serous drainage is noted no ascending psoas or lymphangitis noted changes history of appendectomy dictation right great toe history of diabetes with neuropathy       Assessment & Plan:  Assessment diabetic neuropathic ulcer sub-1 right debrided down to subcutaneous little tissue level sodium dressing applied and recheck in 2-3 weeks for follow-up continue to offload as much as possible infection noted at this time next  Harriet Masson DPM

## 2014-06-07 ENCOUNTER — Ambulatory Visit: Payer: Self-pay

## 2014-06-14 ENCOUNTER — Ambulatory Visit (INDEPENDENT_AMBULATORY_CARE_PROVIDER_SITE_OTHER): Payer: Self-pay

## 2014-06-14 VITALS — BP 177/104 | HR 80 | Resp 12

## 2014-06-14 DIAGNOSIS — L97512 Non-pressure chronic ulcer of other part of right foot with fat layer exposed: Secondary | ICD-10-CM

## 2014-06-14 DIAGNOSIS — E1140 Type 2 diabetes mellitus with diabetic neuropathy, unspecified: Secondary | ICD-10-CM

## 2014-06-14 NOTE — Patient Instructions (Signed)
ANTIBACTERIAL SOAP INSTRUCTIONS  THE DAY AFTER PROCEDURE  Please follow the instructions your doctor has marked.   Shower as usual. Before getting out, place a drop of antibacterial liquid soap (Dial) on a wet, clean washcloth.  Gently wipe washcloth over affected area.  Afterward, rinse the area with warm water.  Blot the area dry with a soft cloth and cover with antibiotic ointment (neosporin, polysporin, bacitracin) and band aid or gauze and tape  Place 3-4 drops of antibacterial liquid soap in a quart of warm tap water.  Submerge foot into water for 20 minutes.  If bandage was applied after your procedure, leave on to allow for easy lift off, then remove and continue with soak for the remaining time.  Next, blot area dry with a soft cloth and cover with a bandage.  Apply other medications as directed by your doctor, such as cortisporin otic solution (eardrops) or neosporin antibiotic ointment  Cleanse wound daily apply Silvadene and gauze dressing as instructed

## 2014-06-14 NOTE — Progress Notes (Signed)
   Subjective:    Patient ID: [name], female    DOB: [birth year], 63 y.o.   MRN: [id number]  HPI ''RT FOOT HAVE A LITTLE DRAINIGE AND THE SKIN IS GETTING THICKER.''   Review of Systems no new findings or systemic changes noted     Objective:   Physical Exam Neurovascular status is intact and unchanged DP +2 PT plus one over 4 capillary fill time 3 seconds there is ulcer about 1.0 x 1.3 cm diameter ulcer down to subcutaneous tissue and fat level. There is no purulence mild serous bloody drainage is noted no malodor noted. Remainder the same unremarkable rectus foot history of previous amputation right great toe does have diabetes from neuropathy and angiopathy history.       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

## 2014-07-05 ENCOUNTER — Ambulatory Visit (INDEPENDENT_AMBULATORY_CARE_PROVIDER_SITE_OTHER): Payer: Self-pay

## 2014-07-05 VITALS — BP 126/74 | HR 105 | Resp 18

## 2014-07-05 DIAGNOSIS — L97512 Non-pressure chronic ulcer of other part of right foot with fat layer exposed: Secondary | ICD-10-CM

## 2014-07-05 DIAGNOSIS — E1140 Type 2 diabetes mellitus with diabetic neuropathy, unspecified: Secondary | ICD-10-CM

## 2014-07-05 NOTE — Patient Instructions (Signed)
ANTIBACTERIAL SOAP INSTRUCTIONS  THE DAY AFTER PROCEDURE  Please follow the instructions your doctor has marked.   Shower as usual. Before getting out, place a drop of antibacterial liquid soap (Dial) on a wet, clean washcloth.  Gently wipe washcloth over affected area.  Afterward, rinse the area with warm water.  Blot the area dry with a soft cloth and cover with antibiotic ointment (neosporin, polysporin, bacitracin) and band aid or gauze and tape  Place 3-4 drops of antibacterial liquid soap in a quart of warm tap water.  Submerge foot into water for 20 minutes.  If bandage was applied after your procedure, leave on to allow for easy lift off, then remove and continue with soak for the remaining time.  Next, blot area dry with a soft cloth and cover with a bandage.  Apply other medications as directed by your doctor, such as cortisporin otic solution (eardrops) or neosporin antibiotic ointment  Cleanse the foot daily dry and then apply Silvadene and gauze dressing as instructed

## 2014-07-05 NOTE — Progress Notes (Signed)
   Subjective:    Patient ID: [name], female    DOB: 01-25-1951, 63 y.o.   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       Assessment & Plan:  Assessment this time his diabetes with history of ulceration due to profound neuropathy the ulcer is debrided down to subcutaneous tissue level Silvadene and gauze dressing reapplied continue with daily dressing changes and Silvadene and gauze dressings recheck in 3 weeks for further follow-up contact us immediately if there's any exacerbations or changes  Harriet Masson DPM

## 2014-07-26 ENCOUNTER — Ambulatory Visit (INDEPENDENT_AMBULATORY_CARE_PROVIDER_SITE_OTHER): Payer: Self-pay

## 2014-07-26 VITALS — BP 150/88 | HR 86 | Resp 12

## 2014-07-26 DIAGNOSIS — E1140 Type 2 diabetes mellitus with diabetic neuropathy, unspecified: Secondary | ICD-10-CM

## 2014-07-26 DIAGNOSIS — L97512 Non-pressure chronic ulcer of other part of right foot with fat layer exposed: Secondary | ICD-10-CM

## 2014-07-26 MED ORDER — SILVER SULFADIAZINE 1 % EX CREA: TOPICAL_CREAM | Freq: Every day | CUTANEOUS | Status: DC

## 2014-07-26 NOTE — Addendum Note (Signed)
Addended by: Clovis Riley E on: 07/26/2014 12:27 PM   Modules accepted: Orders

## 2014-07-26 NOTE — Progress Notes (Signed)
   Subjective:    Patient ID: [name], female    DOB: [birth year], 64 y.o.   MRN: [id number]  HPI  ''RT FOOT HAVE A LITTLE DRAINIGE BUT LOOKING MUCH BETTER.''  Review of Systems no new findings or systemic changes     Objective:   Physical Exam neurovascular status is intact pedal pulses DP +2 and PT plus one over 4 bilateral patient is status post amputation right toe ulcer sub-first right proximally 1 cm x 0.8 cm in overall diameter down to subcutaneous tissue level there was no mild serous drainage is noted no malodor no ascending cellulitis or lymphangitis noted.      Assessment & Plan:  Assessment resolving ulcer sub-first right stable at this time the ulcers debrided down to subcutaneous tissue level Silvadene and gauze dressing applied recheck in 3 weeks for follow-up  Harriet Masson DPM

## 2014-08-16 ENCOUNTER — Ambulatory Visit (INDEPENDENT_AMBULATORY_CARE_PROVIDER_SITE_OTHER): Payer: Self-pay

## 2014-08-16 VITALS — BP 151/74 | HR 79 | Resp 18

## 2014-08-16 DIAGNOSIS — L97512 Non-pressure chronic ulcer of other part of right foot with fat layer exposed: Secondary | ICD-10-CM

## 2014-08-16 DIAGNOSIS — E1140 Type 2 diabetes mellitus with diabetic neuropathy, unspecified: Secondary | ICD-10-CM

## 2014-08-16 NOTE — Progress Notes (Signed)
   Subjective:    Patient ID: [name], female    DOB: [birth year], 64 y.o.   MRN: [id number]  HPI THE PLACE ON THE BALL OF MY RIGHT FOOT IS ABOUT THE SAME AND HAS NO ODOR TO IT AND LOOKS SMALLER    Review of Systems no new findings or systemic changes noted     Objective:   Physical Exam Neurovascular status unchanged DP +2 PT 1 over 4 bilateral the ulcer is still 2 proxy 1 cm x 1.2 cm she stinging smaller no additional shrinking however this is after debridement there was full smaller prior to debridement however there is hemorrhage a keratosis around the periphery female maceration no malodor mild bloody serous drainage is noted. No ascending size lymphangitis noted       Assessment & Plan:  Assessment continued chronic diabetic neuropathic ulcer sub-first metatarsal right foot the ulcer is debrided down to subcutaneous tissue level Silvadene and gauze dressing applied continue to offload as much as possible recheck in 2-3 weeks for follow-up do daily or twice daily dressing changes as instructed next  Harriet Masson DPM

## 2014-09-06 ENCOUNTER — Ambulatory Visit (INDEPENDENT_AMBULATORY_CARE_PROVIDER_SITE_OTHER): Payer: Self-pay

## 2014-09-06 VITALS — BP 170/84 | HR 49 | Temp 98.4°F | Resp 14

## 2014-09-06 DIAGNOSIS — E1140 Type 2 diabetes mellitus with diabetic neuropathy, unspecified: Secondary | ICD-10-CM

## 2014-09-06 DIAGNOSIS — L97512 Non-pressure chronic ulcer of other part of right foot with fat layer exposed: Secondary | ICD-10-CM

## 2014-09-06 NOTE — Patient Instructions (Signed)
ANTIBACTERIAL SOAP INSTRUCTIONS  THE DAY AFTER PROCEDURE  Please follow the instructions your doctor has marked.   Shower as usual. Before getting out, place a drop of antibacterial liquid soap (Dial) on a wet, clean washcloth.  Gently wipe washcloth over affected area.  Afterward, rinse the area with warm water.  Blot the area dry with a soft cloth and cover with antibiotic ointment (neosporin, polysporin, bacitracin) and band aid or gauze and tape  Place 3-4 drops of antibacterial liquid soap in a quart of warm tap water.  Submerge foot into water for 20 minutes.  If bandage was applied after your procedure, leave on to allow for easy lift off, then remove and continue with soak for the remaining time.  Next, blot area dry with a soft cloth and cover with a bandage.  Apply other medications as directed by your doctor, such as cortisporin otic solution (eardrops) or neosporin antibiotic ointment  Cleanse wound daily and redressed with Silvadene and gauze dressing as instructed

## 2014-09-06 NOTE — Progress Notes (Signed)
   Subjective:    Patient ID: [name], female    DOB: 09-19-1950, 64 y.o.   MRN: [id number]  HPI Comments: Pt states she has had some improvement, but still draining with malodor.     Review of Systems no new findings or systemic changes noted     Objective:   Physical Exam  Neurovascular status is intact and unchanged DP +2 PT 1 over 4 bilateral the ulcer is approximately 1 cm in diameter overall with about a half centimeter depth there is some hemorrhage a keratosis surrounding the periphery the appears to be shrinking around the periphery getting smaller this is small study consisted decrease in size. Patient is been decreasing amount of walking or standing she's been doing and walking her dogs etc. No other wounds no malodor no discharge drainage mild bloody drainage reported by the patient      Assessment & Plan:  Assessment diabetic neuropathic chronic ulceration sub-first right. The ulcers debrided Iodosorb and gauze dressing applied however will maintain Silvadene and gauze dressings daily with daily cleansing and with soap and water reappointed 3 weeks for long-term follow-up continue to offload when possible  Harriet Masson DPM

## 2014-09-27 ENCOUNTER — Ambulatory Visit (INDEPENDENT_AMBULATORY_CARE_PROVIDER_SITE_OTHER): Payer: Self-pay

## 2014-09-27 VITALS — BP 158/91 | HR 84 | Resp 12

## 2014-09-27 DIAGNOSIS — L89891 Pressure ulcer of other site, stage 1: Secondary | ICD-10-CM

## 2014-09-27 DIAGNOSIS — L97512 Non-pressure chronic ulcer of other part of right foot with fat layer exposed: Secondary | ICD-10-CM

## 2014-09-27 DIAGNOSIS — E1140 Type 2 diabetes mellitus with diabetic neuropathy, unspecified: Secondary | ICD-10-CM

## 2014-09-27 NOTE — Progress Notes (Signed)
   Subjective:    Patient ID: [name], female    DOB: Sep 13, 1950, 64 y.o.   MRN: [id number]  HPI  ''RT FOOT STILL HAVE A LITTLE DRAINAGE BUT DOING MUCH BETTER.''  Review of Systems no new findings or systemic changes noted     Objective:   Physical Exam Neurovascular status is intact pedal pulses DP +2 PT 1 over 4 there is still about 1 x 1.3 cm ulceration sub-first MTP area right there is increasing hemorrhage a keratoses and patient admits she has not been taking her insulin only taking the pills and she has not been watching her diet her sugars gone up a little bit recently. 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 Reappointed 3 weeks for further follow-up the ulcers debrided down to subcutaneous tissue level Silvadene and gauze dressing reapplied at this time continue to offload as much as possible patient is encouraged to a better job watching her diabetes her her sugars and take medications as instructed. Recheck in 3 weeks  Harriet Masson DPM

## 2014-10-18 ENCOUNTER — Ambulatory Visit: Payer: Self-pay

## 2014-11-11 ENCOUNTER — Telehealth: Payer: Self-pay | Admitting: *Deleted

## 2014-11-11 NOTE — Telephone Encounter (Signed)
Dr. Blenda Mounts called stating he wanted to make certain that pt continued her foot care with one of the other doctors.  Left message encouraging pt to call with questions and concern and to keep up with her appts.

## 2014-12-20 ENCOUNTER — Ambulatory Visit (INDEPENDENT_AMBULATORY_CARE_PROVIDER_SITE_OTHER): Payer: Self-pay | Admitting: Podiatry

## 2014-12-20 ENCOUNTER — Encounter: Payer: Self-pay | Admitting: Podiatry

## 2014-12-20 VITALS — BP 161/77 | HR 96 | Temp 99.0°F | Resp 16

## 2014-12-20 DIAGNOSIS — E1140 Type 2 diabetes mellitus with diabetic neuropathy, unspecified: Secondary | ICD-10-CM

## 2014-12-20 DIAGNOSIS — E11621 Type 2 diabetes mellitus with foot ulcer: Secondary | ICD-10-CM | POA: Insufficient documentation

## 2014-12-20 DIAGNOSIS — L97512 Non-pressure chronic ulcer of other part of right foot with fat layer exposed: Secondary | ICD-10-CM

## 2014-12-20 DIAGNOSIS — L89891 Pressure ulcer of other site, stage 1: Secondary | ICD-10-CM

## 2014-12-20 DIAGNOSIS — L97519 Non-pressure chronic ulcer of other part of right foot with unspecified severity: Principal | ICD-10-CM

## 2014-12-20 NOTE — Progress Notes (Signed)
This patient presents to the office with diabetic ulcer under her big toe right foot.  She says her ulcer is developing thickened area around the edge of the ulcer.  She says the ulcer has increased in size but she  believes there is no infection at this time.  She has been bandaging and soaking her foot at home but she knew she needed to be evaluated for her ulcer.  Objective  Amputation of first and second digits B/L.   Remaining digits are all contracted.  Her diabetic ulcer has a hyperkeratotic edge around her 10 x 15 mm. Ulcer.  No redness or swelling or drainage or pus noted from her ulcer site.  Assessment  Diabetic   ulcer right foot.  2. Diabetes with neuropathy.  Plan.  Debridement of ulcer right foot revealing a clean granular base without evidence of infection.  Told her we need to off weight bear so we get the ulcer to close.  She has orthowedge shoe at home to wear.  She was told to soak her foot and rebandage.  RTC 2 weeks for reevaluation.

## 2015-01-05 ENCOUNTER — Ambulatory Visit: Payer: Self-pay | Admitting: Podiatry

## 2015-01-19 ENCOUNTER — Ambulatory Visit: Payer: Self-pay | Admitting: Podiatry

## 2015-02-07 ENCOUNTER — Ambulatory Visit (INDEPENDENT_AMBULATORY_CARE_PROVIDER_SITE_OTHER): Payer: Self-pay | Admitting: Podiatry

## 2015-02-07 ENCOUNTER — Encounter: Payer: Self-pay | Admitting: Podiatry

## 2015-02-07 VITALS — BP 137/79 | HR 90 | Temp 98.8°F | Resp 16

## 2015-02-07 DIAGNOSIS — L97519 Non-pressure chronic ulcer of other part of right foot with unspecified severity: Secondary | ICD-10-CM

## 2015-02-07 DIAGNOSIS — E11621 Type 2 diabetes mellitus with foot ulcer: Secondary | ICD-10-CM

## 2015-02-07 DIAGNOSIS — E1140 Type 2 diabetes mellitus with diabetic neuropathy, unspecified: Secondary | ICD-10-CM

## 2015-02-07 NOTE — Progress Notes (Signed)
Subjective:     Patient ID: [name], female   DOB: 05-Apr-1951, 64 y.o.   MRN: [id number]  HPIpatient presents with diabetic ulcer under ball of right foot.  She says she has been unable to follow up for her ulcer.  She says the ulcer has developed and reoccured .  She has no drainage from the ulcer.  She desires to continue with treatment to help heal her ulcer.   Review of Systems     Objective:   Physical Exam GENERAL APPEARANCE: Alert, conversant. Appropriately groomed. No acute distress.  VASCULAR: Pedal pulses palpable at 2/4 DP and PT bilateral.  Capillary refill time is immediate to all digits,  Proximal to distal cooling it warm to warm.  Digital hair growth is present bilateral  NEUROLOGIC: Diminished LOPS.    MUSCULOSKELETAL: acceptable muscle strength, tone and stability bilateral.  Intrinsic muscluature intact bilateral.  Rectus appearance of foot and digits noted bilateral.   DERMATOLOGIC: skin color, texture, and turgor are within normal limits.    Digital nails are asymptomatic. No drainage noted. ULCER  There is 10 x 15 mm diabetic ulcer with necrotic ring of tissue.  No redness or swelling noted. No malodor or drainage noted.  Granulation tissue at base of ulcer. No infection noted.     Assessment:     Diabetic Ulcer right foot     Plan:     Debride necrotic tissue down to fat.  Neosporin/DSD  Home instructions given.  RTR 2 weeks.

## 2015-02-28 ENCOUNTER — Encounter: Payer: Self-pay | Admitting: Podiatry

## 2015-02-28 ENCOUNTER — Ambulatory Visit (INDEPENDENT_AMBULATORY_CARE_PROVIDER_SITE_OTHER): Payer: Self-pay | Admitting: Podiatry

## 2015-02-28 VITALS — BP 140/65 | HR 42 | Temp 99.1°F | Resp 14

## 2015-02-28 DIAGNOSIS — L89891 Pressure ulcer of other site, stage 1: Secondary | ICD-10-CM

## 2015-02-28 DIAGNOSIS — E11621 Type 2 diabetes mellitus with foot ulcer: Secondary | ICD-10-CM

## 2015-02-28 DIAGNOSIS — L97519 Non-pressure chronic ulcer of other part of right foot with unspecified severity: Principal | ICD-10-CM

## 2015-02-28 NOTE — Progress Notes (Signed)
Subjective:     Patient ID: [name], female   DOB: 1951-02-24, 64 y.o.   MRN: [id number]  HPIpatient presents with diabetic ulcer under ball of right foot.  .  She says the ulcer has developed and reoccured . She says she occasionally soaks her foot but always rebandages her foot.  She has no drainage from the ulcer.  She desires to continue with treatment to help heal her ulcer.   Review of Systems     Objective:   Physical Exam GENERAL APPEARANCE: Alert, conversant. Appropriately groomed. No acute distress.  VASCULAR: Pedal pulses palpable at 2/4 DP and PT bilateral.  Capillary refill time is immediate to all digits,  Proximal to distal cooling it warm to warm.  Digital hair growth is present bilateral  NEUROLOGIC: Diminished LOPS.    MUSCULOSKELETAL: acceptable muscle strength, tone and stability bilateral.  Intrinsic muscluature intact bilateral.  Rectus appearance of foot and digits noted bilateral.   DERMATOLOGIC: skin color, texture, and turgor are within normal limits.    Digital nails are asymptomatic. No drainage noted. ULCER  There is 10 x 15 mm diabetic ulcer with necrotic ring of tissue.  No redness or swelling noted. No malodor or drainage noted.  Granulation tissue at base of ulcer. No infection noted.     Assessment:     Diabetic Ulcer right foot     Plan:     Debride necrotic tissue down to fat.  Neosporin/DSD  Home instructions given.  RTR 2 weeks.

## 2015-03-13 ENCOUNTER — Telehealth: Payer: Self-pay | Admitting: *Deleted

## 2015-03-13 NOTE — Telephone Encounter (Addendum)
Fax request for Silver Sulfa 1 % Cream.  Reviewed pt's last OV 02/28/2015, Neosporin/DSD ordered by Dr. Prudence Davidson.  Left message informing pt of Dr. Burnell Blanks last orders.

## 2015-03-17 ENCOUNTER — Telehealth: Payer: Self-pay | Admitting: *Deleted

## 2015-03-17 MED ORDER — SILVER SULFADIAZINE 1 % EX CREA: 1.0000 "application " | TOPICAL_CREAM | Freq: Every day | CUTANEOUS | Status: DC

## 2015-03-17 NOTE — Telephone Encounter (Signed)
Dr. Denyse Dago refill of generic Silvadene Cream with 2 refills.

## 2015-03-21 ENCOUNTER — Ambulatory Visit: Payer: Self-pay | Admitting: Podiatry

## 2015-03-30 ENCOUNTER — Ambulatory Visit: Payer: Self-pay | Admitting: Podiatry

## 2015-04-04 ENCOUNTER — Encounter: Payer: Self-pay | Admitting: Podiatry

## 2015-04-04 ENCOUNTER — Ambulatory Visit (INDEPENDENT_AMBULATORY_CARE_PROVIDER_SITE_OTHER): Payer: Self-pay | Admitting: Podiatry

## 2015-04-04 VITALS — BP 155/79 | HR 46 | Temp 99.4°F | Resp 14

## 2015-04-04 DIAGNOSIS — L97519 Non-pressure chronic ulcer of other part of right foot with unspecified severity: Principal | ICD-10-CM

## 2015-04-04 DIAGNOSIS — E1140 Type 2 diabetes mellitus with diabetic neuropathy, unspecified: Secondary | ICD-10-CM

## 2015-04-04 DIAGNOSIS — L89891 Pressure ulcer of other site, stage 1: Secondary | ICD-10-CM

## 2015-04-04 DIAGNOSIS — E11621 Type 2 diabetes mellitus with foot ulcer: Secondary | ICD-10-CM

## 2015-04-04 MED ORDER — SILVER SULFADIAZINE 1 % EX CREA: 1.0000 "application " | TOPICAL_CREAM | Freq: Every day | CUTANEOUS | Status: DC

## 2015-04-04 NOTE — Progress Notes (Signed)
Subjective:     Patient ID: [name], female   DOB: 1951/06/19, 64 y.o.   MRN: [id number]  HPIpatient presents with diabetic ulcer under ball of right foot.  .  She says the ulcer has reopened after looking good this Saturday. . She says she occasionally soaks her foot but always rebandages her foot.  She has no drainage from the ulcer.  She desires to continue with treatment to help heal her ulcer.   Review of Systems     Objective:   Physical Exam GENERAL APPEARANCE: Alert, conversant. Appropriately groomed. No acute distress.  VASCULAR: Pedal pulses palpable at 2/4 DP and PT bilateral.  Capillary refill time is immediate to all digits,  Proximal to distal cooling it warm to warm.  Digital hair growth is present bilateral  NEUROLOGIC: Diminished LOPS.    MUSCULOSKELETAL: acceptable muscle strength, tone and stability bilateral.  Intrinsic muscluature intact bilateral.  Rectus appearance of foot and digits noted bilateral.   DERMATOLOGIC: skin color, texture, and turgor are within normal limits.    Digital nails are asymptomatic. No drainage noted. ULCER  There is 10 x 15 mm diabetic ulcer with necrotic ring of tissue.  No redness or swelling noted. No malodor or drainage noted.  Granulation tissue at base of ulcer. No infection noted.     Assessment:     Diabetic Ulcer right foot     Plan:     Debride necrotic tissue down to fat.  Neosporin/DSD  Home instructions given.  RTR 2 weeks. Prescribed silvedene.

## 2015-04-25 ENCOUNTER — Encounter: Payer: Self-pay | Admitting: Podiatry

## 2015-04-25 ENCOUNTER — Ambulatory Visit (INDEPENDENT_AMBULATORY_CARE_PROVIDER_SITE_OTHER): Payer: Self-pay | Admitting: Podiatry

## 2015-04-25 DIAGNOSIS — E11621 Type 2 diabetes mellitus with foot ulcer: Secondary | ICD-10-CM

## 2015-04-25 DIAGNOSIS — L97519 Non-pressure chronic ulcer of other part of right foot with unspecified severity: Secondary | ICD-10-CM

## 2015-04-25 DIAGNOSIS — E1140 Type 2 diabetes mellitus with diabetic neuropathy, unspecified: Secondary | ICD-10-CM

## 2015-04-25 NOTE — Progress Notes (Signed)
Subjective:     Patient ID: [name], female   DOB: [birth year], 64 y.o.   MRN: [id number]  HPIpatient presents with diabetic ulcer under ball of right foot.  .  She says the ulcer has reopened after looking good this Saturday. . She says she occasionally soaks her foot but always rebandages her foot.  She has no drainage from the ulcer.  She desires to continue with treatment to help heal her ulcer.   Review of Systems     Objective:   Physical Exam GENERAL APPEARANCE: Alert, conversant. Appropriately groomed. No acute distress.  VASCULAR: Pedal pulses palpable at 2/4 DP and PT bilateral.  Capillary refill time is immediate to all digits,  Proximal to distal cooling it warm to warm.  Digital hair growth is present bilateral  NEUROLOGIC: Diminished LOPS.    MUSCULOSKELETAL: acceptable muscle strength, tone and stability bilateral.  Intrinsic muscluature intact bilateral.  Rectus appearance of foot and digits noted bilateral.   DERMATOLOGIC: skin color, texture, and turgor are within normal limits.    Digital nails are asymptomatic. No drainage noted. ULCER  There is 10 x 15 mm diabetic ulcer with necrotic ring of tissue.  No redness or swelling noted. No malodor or drainage noted.  Granulation tissue at base of ulcer. No infection noted.     Assessment:     Diabetic Ulcer right foot     Plan:     Debride necrotic tissue down to fat.  Neosporin/DSD  Home instructions given.  RTR 4 weeks.

## 2015-05-23 ENCOUNTER — Ambulatory Visit (INDEPENDENT_AMBULATORY_CARE_PROVIDER_SITE_OTHER): Payer: Self-pay | Admitting: Podiatry

## 2015-05-23 ENCOUNTER — Encounter: Payer: Self-pay | Admitting: Podiatry

## 2015-05-23 VITALS — BP 148/76 | HR 76 | Resp 45

## 2015-05-23 DIAGNOSIS — L89891 Pressure ulcer of other site, stage 1: Secondary | ICD-10-CM

## 2015-05-23 DIAGNOSIS — E1140 Type 2 diabetes mellitus with diabetic neuropathy, unspecified: Secondary | ICD-10-CM

## 2015-05-23 DIAGNOSIS — L97519 Non-pressure chronic ulcer of other part of right foot with unspecified severity: Principal | ICD-10-CM

## 2015-05-23 DIAGNOSIS — E11621 Type 2 diabetes mellitus with foot ulcer: Secondary | ICD-10-CM

## 2015-05-23 NOTE — Progress Notes (Signed)
Subjective:     Patient ID: [name], female   DOB: 11-May-1951, 64 y.o.   MRN: [id number]  HPIpatient presents with diabetic ulcer under ball of right foot.  .  She says the ulcer was doing well and decreasing in size.  She then says the ulcer worsened this weekend. . . She says she occasionally soaks her foot but always rebandages her foot.  She has no drainage from the ulcer.  She desires to continue with treatment to help heal her ulcer.   Review of Systems     Objective:   Physical Exam GENERAL APPEARANCE: Alert, conversant. Appropriately groomed. No acute distress.  VASCULAR: Pedal pulses palpable at 2/4 DP and PT bilateral.  Capillary refill time is immediate to all digits,  Proximal to distal cooling it warm to warm.  Digital hair growth is present bilateral  NEUROLOGIC: Diminished LOPS.    MUSCULOSKELETAL: acceptable muscle strength, tone and stability bilateral.  Intrinsic muscluature intact bilateral.  Rectus appearance of foot and digits noted bilateral.   DERMATOLOGIC: skin color, texture, and turgor are within normal limits.    Digital nails are asymptomatic. No drainage noted. ULCER  There is 10 x 1o mm diabetic ulcer with necrotic ring of tissue.  No redness or swelling noted. No malodor or drainage noted.  Granulation tissue at base of ulcer. No infection noted.     Assessment:     Diabetic Ulcer right foot     Plan:     Debride necrotic tissue down to fat.  Neosporin/DSD  Home instructions given.  RTR 2 weeks. Prescribed silvedene.

## 2015-06-20 ENCOUNTER — Encounter: Payer: Self-pay | Admitting: Podiatry

## 2015-06-20 ENCOUNTER — Ambulatory Visit (INDEPENDENT_AMBULATORY_CARE_PROVIDER_SITE_OTHER): Payer: Self-pay | Admitting: Podiatry

## 2015-06-20 VITALS — BP 164/101 | HR 85 | Resp 14

## 2015-06-20 DIAGNOSIS — L89891 Pressure ulcer of other site, stage 1: Secondary | ICD-10-CM

## 2015-06-20 DIAGNOSIS — L97519 Non-pressure chronic ulcer of other part of right foot with unspecified severity: Principal | ICD-10-CM

## 2015-06-20 DIAGNOSIS — E11621 Type 2 diabetes mellitus with foot ulcer: Secondary | ICD-10-CM

## 2015-06-20 DIAGNOSIS — E1140 Type 2 diabetes mellitus with diabetic neuropathy, unspecified: Secondary | ICD-10-CM

## 2015-06-20 NOTE — Progress Notes (Signed)
Subjective:     Patient ID: [name], female   DOB: 1951/05/19, 64 y.o.   MRN: [id number]  HPIpatient presents with diabetic ulcer under ball of right foot.  .  She says the ulcer was doing well and decreasing in size.  She then says the ulcer worsened this weekend. . . She says she occasionally soaks her foot but always rebandages her foot.  She has no drainage from the ulcer.  She desires to continue with treatment to help heal her ulcer.   Review of Systems     Objective:   Physical Exam GENERAL APPEARANCE: Alert, conversant. Appropriately groomed. No acute distress.  VASCULAR: Pedal pulses palpable at 2/4 DP and PT bilateral.  Capillary refill time is immediate to all digits,  Proximal to distal cooling it warm to warm.  Digital hair growth is present bilateral  NEUROLOGIC: Diminished LOPS.    MUSCULOSKELETAL: acceptable muscle strength, tone and stability bilateral.  Intrinsic muscluature intact bilateral.  Rectus appearance of foot and digits noted bilateral.   DERMATOLOGIC: skin color, texture, and turgor are within normal limits.    Digital nails are asymptomatic. No drainage noted. ULCER  There is 10 x 20  mm diabetic ulcer with necrotic ring of tissue.  No redness or swelling noted. No malodor or drainage noted.  Granulation tissue at base of ulcer. No infection noted.     Assessment:     Diabetic Ulcer right foot     Plan:     Debride necrotic tissue down to fat.  Neosporin/DSD  Home instructions given.  RTR 2 week.

## 2015-07-11 ENCOUNTER — Encounter: Payer: Self-pay | Admitting: Podiatry

## 2015-07-11 ENCOUNTER — Ambulatory Visit: Payer: Self-pay | Admitting: Podiatry

## 2015-07-11 ENCOUNTER — Ambulatory Visit (INDEPENDENT_AMBULATORY_CARE_PROVIDER_SITE_OTHER): Payer: Self-pay | Admitting: Podiatry

## 2015-07-11 VITALS — Temp 97.6°F

## 2015-07-11 DIAGNOSIS — L97519 Non-pressure chronic ulcer of other part of right foot with unspecified severity: Principal | ICD-10-CM

## 2015-07-11 DIAGNOSIS — E11621 Type 2 diabetes mellitus with foot ulcer: Secondary | ICD-10-CM

## 2015-07-11 DIAGNOSIS — E1140 Type 2 diabetes mellitus with diabetic neuropathy, unspecified: Secondary | ICD-10-CM

## 2015-07-11 DIAGNOSIS — L89891 Pressure ulcer of other site, stage 1: Secondary | ICD-10-CM

## 2015-07-11 NOTE — Progress Notes (Signed)
Subjective:     Patient ID: [name], female   DOB: [birth year], 64 y.o.   MRN: [id number]  HPIpatient presents with diabetic ulcer under ball of right foot.  .  She says the ulcer was doing well and decreasing in size.  She then says the ulcer worsened this weekend. . . She says she occasionally soaks her foot but always rebandages her foot.  She has no drainage from the ulcer.  She desires to continue with treatment to help heal her ulcer.   Review of Systems     Objective:   Physical Exam GENERAL APPEARANCE: Alert, conversant. Appropriately groomed. No acute distress.  VASCULAR: Pedal pulses palpable at 2/4 DP and PT bilateral.  Capillary refill time is immediate to all digits,  Proximal to distal cooling it warm to warm.  Digital hair growth is present bilateral  NEUROLOGIC: Diminished LOPS.    MUSCULOSKELETAL: acceptable muscle strength, tone and stability bilateral.  Intrinsic muscluature intact bilateral.  Rectus appearance of foot and digits noted bilateral.   DERMATOLOGIC: skin color, texture, and turgor are within normal limits.    Digital nails are asymptomatic. No drainage noted. ULCER  There is 10 x 15 mm. mm diabetic ulcer with necrotic ring of tissue.  No redness or swelling noted. No malodor or drainage noted.  Granulation tissue at base of ulcer. No infection noted.     Assessment:     Diabetic Ulcer right foot     Plan:     Debride necrotic tissue down to fat.  Neosporin/DSD  Home instructions given.  RTC 2 week. The ulcer has decreased in size with no necrotic tissue around the ulcer. Pleased with improvement.  If this condition worsens or becomes very painful, the patient was told to contact this office or go to the Emergency Department at the hospital.  Gardiner Barefoot DPM

## 2015-08-11 ENCOUNTER — Ambulatory Visit: Payer: Self-pay | Admitting: Podiatry

## 2015-08-15 ENCOUNTER — Telehealth: Payer: Self-pay | Admitting: *Deleted

## 2015-08-15 MED ORDER — SILVER SULFADIAZINE 1 % EX CREA: 1.0000 "application " | TOPICAL_CREAM | Freq: Every day | CUTANEOUS | Status: DC

## 2015-08-15 NOTE — Telephone Encounter (Addendum)
Pt request refill of Silvadene cream.  Dr. Prudence Davidson ordered refill +2 additional.  Informed pt.

## 2015-08-25 ENCOUNTER — Ambulatory Visit: Payer: Self-pay | Admitting: Podiatry

## 2015-09-08 ENCOUNTER — Ambulatory Visit (INDEPENDENT_AMBULATORY_CARE_PROVIDER_SITE_OTHER): Payer: Self-pay | Admitting: Podiatry

## 2015-09-08 ENCOUNTER — Encounter: Payer: Self-pay | Admitting: Podiatry

## 2015-09-08 VITALS — BP 192/103 | HR 83 | Resp 14

## 2015-09-08 DIAGNOSIS — L89891 Pressure ulcer of other site, stage 1: Secondary | ICD-10-CM

## 2015-09-08 DIAGNOSIS — L97519 Non-pressure chronic ulcer of other part of right foot with unspecified severity: Principal | ICD-10-CM

## 2015-09-08 DIAGNOSIS — E11621 Type 2 diabetes mellitus with foot ulcer: Secondary | ICD-10-CM

## 2015-09-08 DIAGNOSIS — E1140 Type 2 diabetes mellitus with diabetic neuropathy, unspecified: Secondary | ICD-10-CM

## 2015-09-08 NOTE — Progress Notes (Signed)
Subjective:     Patient ID: [name], female   DOB: 20-Oct-1950, 65 y.o.   MRN: [id number]  HPIpatient presents with diabetic ulcer under ball of right foot.  .  She says the ulcer was doing well and decreasing in size.  She then says the ulcer worsened this weekend. . . She say she has not done a good job on her ulcer and believes it has increased in size.Marland Kitchen  She has no drainage from the ulcer.  She desires to continue with treatment to help heal her ulcer.   Review of Systems     Objective:   Physical Exam GENERAL APPEARANCE: Alert, conversant. Appropriately groomed. No acute distress.  VASCULAR: Pedal pulses palpable at 2/4 DP and PT bilateral.  Capillary refill time is immediate to all digits,  Proximal to distal cooling it warm to warm.  Digital hair growth is present bilateral  NEUROLOGIC: Diminished LOPS.    MUSCULOSKELETAL: acceptable muscle strength, tone and stability bilateral.  Intrinsic muscluature intact bilateral.  Rectus appearance of foot and digits noted bilateral.   DERMATOLOGIC: skin color, texture, and turgor are within normal limits.    Digital nails are asymptomatic. No drainage noted. ULCER  There is 20 mm x 15 mm. mm diabetic ulcer with necrotic ring of tissue.  No redness or swelling noted. No malodor or drainage noted.  Granulation tissue at base of ulcer. No infection noted.     Assessment:     Diabetic Ulcer right foot     Plan:     Debride necrotic tissue down to fat.  Neosporin/DSD  Home instructions given.  RTC 2 week. The ulcer has decreased in size with no necrotic tissue around the ulcer.   Dispensed helix 3 to add for treating ulcer at home.  Gardiner Barefoot DPM

## 2015-09-22 ENCOUNTER — Ambulatory Visit (INDEPENDENT_AMBULATORY_CARE_PROVIDER_SITE_OTHER): Payer: Self-pay | Admitting: Podiatry

## 2015-09-22 ENCOUNTER — Encounter: Payer: Self-pay | Admitting: Podiatry

## 2015-09-22 VITALS — BP 169/98 | HR 96 | Resp 16

## 2015-09-22 DIAGNOSIS — L89891 Pressure ulcer of other site, stage 1: Secondary | ICD-10-CM

## 2015-09-22 DIAGNOSIS — E1140 Type 2 diabetes mellitus with diabetic neuropathy, unspecified: Secondary | ICD-10-CM

## 2015-09-22 DIAGNOSIS — L97519 Non-pressure chronic ulcer of other part of right foot with unspecified severity: Principal | ICD-10-CM

## 2015-09-22 DIAGNOSIS — L97512 Non-pressure chronic ulcer of other part of right foot with fat layer exposed: Secondary | ICD-10-CM

## 2015-09-22 DIAGNOSIS — E11621 Type 2 diabetes mellitus with foot ulcer: Secondary | ICD-10-CM

## 2015-09-22 NOTE — Progress Notes (Signed)
Subjective:     Patient ID: [name], female   DOB: 1951/05/13, 65 y.o.   MRN: [id number]  HPIpatient presents with diabetic ulcer under ball of right foot.  .  She says the ulcer was doing well and decreasing in size.  Marland Kitchen  She has minimal  drainage from the ulcer.  She desires to continue with treatment to help heal her ulcer.   Review of Systems     Objective:   Physical Exam GENERAL APPEARANCE: Alert, conversant. Appropriately groomed. No acute distress.  VASCULAR: Pedal pulses palpable at 2/4 DP and PT bilateral.  Capillary refill time is immediate to all digits,  Proximal to distal cooling it warm to warm.  Digital hair growth is present bilateral  NEUROLOGIC: Diminished LOPS.    MUSCULOSKELETAL: acceptable muscle strength, tone and stability bilateral.  Intrinsic muscluature intact bilateral.  Rectus appearance of foot and digits noted bilateral.   DERMATOLOGIC: skin color, texture, and turgor are within normal limits.    Digital nails are asymptomatic. No drainage noted. ULCER  There is 15 x 10 mm. mm. mm diabetic ulcer with necrotic ring of tissue.  No redness or swelling noted. No malodor or drainage noted.  Granulation tissue at base of ulcer. No infection noted.     Assessment:     Diabetic Ulcer right foot     Plan:     Debride necrotic tissue down to fat.  Neosporin/DSD  Home instructions given.  RTC 3 week. The ulcer has decreased in size with no necrotic tissue around the ulcer.    Silvadene/DSD.  Gardiner Barefoot DPM

## 2015-10-13 ENCOUNTER — Ambulatory Visit: Payer: Self-pay | Admitting: Podiatry

## 2015-10-31 ENCOUNTER — Encounter: Payer: Self-pay | Admitting: Podiatry

## 2015-10-31 ENCOUNTER — Ambulatory Visit (INDEPENDENT_AMBULATORY_CARE_PROVIDER_SITE_OTHER): Payer: Medicare Other | Admitting: Podiatry

## 2015-10-31 DIAGNOSIS — L97519 Non-pressure chronic ulcer of other part of right foot with unspecified severity: Secondary | ICD-10-CM | POA: Diagnosis not present

## 2015-10-31 DIAGNOSIS — E1140 Type 2 diabetes mellitus with diabetic neuropathy, unspecified: Secondary | ICD-10-CM

## 2015-10-31 DIAGNOSIS — E11621 Type 2 diabetes mellitus with foot ulcer: Secondary | ICD-10-CM | POA: Diagnosis not present

## 2015-10-31 NOTE — Progress Notes (Signed)
Subjective:     Patient ID: [name], female   DOB: 02-07-1951, 65 y.o.   MRN: [id number]  HPIpatient presents with diabetic ulcer under ball of right foot.  .  She says the ulcer was doing well and decreasing in size.  Marland Kitchen  She has drainage from the ulcer on the bandage that she wore to the office.  She desires to continue with treatment to help heal her ulcer.   Review of Systems     Objective:   Physical Exam GENERAL APPEARANCE: Alert, conversant. Appropriately groomed. No acute distress.  VASCULAR: Pedal pulses palpable at 2/4 DP and PT bilateral.  Capillary refill time is immediate to all digits,  Proximal to distal cooling it warm to warm.  Digital hair growth is present bilateral  NEUROLOGIC: Diminished LOPS.    MUSCULOSKELETAL: acceptable muscle strength, tone and stability bilateral.  Intrinsic muscluature intact bilateral.  Rectus appearance of foot and digits noted bilateral.   DERMATOLOGIC: skin color, texture, and turgor are within normal limits.    Digital nails are asymptomatic. No drainage noted. ULCER  There is 15 x 10 mm. mm. mm diabetic ulcer with necrotic ring of tissue.  No redness or swelling noted. No malodor or drainage noted.  Granulation tissue at base of ulcer. No infection noted.     Assessment:     Diabetic Ulcer right foot     Plan:     Debride necrotic tissue down to fat.  Neosporin/DSD  Home instructions given.  RTC 3 weeks. The ulcer has ceased improving at this time.  I am concerned the ulcer is not healing due to the bone under her ulcer or her first metatarsal.  I recommended she be examined by Dr. Jacqualyn Posey who might make recommendations on future treatment.  Gardiner Barefoot DPM

## 2015-11-24 ENCOUNTER — Encounter: Payer: Self-pay | Admitting: Podiatry

## 2015-11-24 ENCOUNTER — Ambulatory Visit (INDEPENDENT_AMBULATORY_CARE_PROVIDER_SITE_OTHER): Payer: Medicare Other | Admitting: Podiatry

## 2015-11-24 VITALS — BP 156/86 | HR 84 | Resp 14

## 2015-11-24 DIAGNOSIS — L89891 Pressure ulcer of other site, stage 1: Secondary | ICD-10-CM | POA: Diagnosis not present

## 2015-11-24 DIAGNOSIS — E1140 Type 2 diabetes mellitus with diabetic neuropathy, unspecified: Secondary | ICD-10-CM

## 2015-11-24 DIAGNOSIS — L97519 Non-pressure chronic ulcer of other part of right foot with unspecified severity: Principal | ICD-10-CM

## 2015-11-24 DIAGNOSIS — E11621 Type 2 diabetes mellitus with foot ulcer: Secondary | ICD-10-CM

## 2015-11-24 NOTE — Progress Notes (Signed)
Subjective:     Patient ID: [name], female   DOB: Dec 17, 1950, 65 y.o.   MRN: [id number]  HPIpatient presents with diabetic ulcer under ball of right foot.  .  She says the ulcer was doing well and decreasing in size.  .  .  She desires to continue with treatment to help heal her ulcer. She says she soaks and bandages her foot as described.   Review of Systems     Objective:   Physical Exam GENERAL APPEARANCE: Alert, conversant. Appropriately groomed. No acute distress.  VASCULAR: Pedal pulses palpable at 2/4 DP and PT bilateral.  Capillary refill time is immediate to all digits,  Proximal to distal cooling it warm to warm.  Digital hair growth is present bilateral  NEUROLOGIC: Diminished LOPS.    MUSCULOSKELETAL: acceptable muscle strength, tone and stability bilateral.  Intrinsic muscluature intact bilateral.  Rectus appearance of foot and digits noted bilateral.   DERMATOLOGIC: skin color, texture, and turgor are within normal limits.    Digital nails are asymptomatic. No drainage noted. ULCER  There is 10 x 10 mm.  diabetic ulcer with necrotic ring of tissue.  No redness or swelling noted. No malodor or drainage noted.  Granulation tissue at base of ulcer. No infection noted.     Assessment:     Diabetic Ulcer right foot     Plan:     Debride necrotic tissue down to fat.  Neosporin/DSD  Home instructions given.  RTC 3 weeks. The ulcer has ceased improving at this time.  I am pleased to see improvement.  Gardiner Barefoot DPM

## 2015-11-24 NOTE — Addendum Note (Signed)
Addended by: Ezzard Flax, Earnie Bechard L on: 11/24/2015 12:49 PM   Modules accepted: Medications

## 2015-12-11 ENCOUNTER — Encounter: Payer: Self-pay | Admitting: Family Medicine

## 2015-12-11 ENCOUNTER — Ambulatory Visit (INDEPENDENT_AMBULATORY_CARE_PROVIDER_SITE_OTHER): Payer: Medicare Other | Admitting: Family Medicine

## 2015-12-11 VITALS — BP 170/100 | HR 86 | Temp 98.2°F | Resp 18 | Wt 251.0 lb

## 2015-12-11 DIAGNOSIS — I10 Essential (primary) hypertension: Secondary | ICD-10-CM | POA: Diagnosis not present

## 2015-12-11 DIAGNOSIS — L97509 Non-pressure chronic ulcer of other part of unspecified foot with unspecified severity: Secondary | ICD-10-CM | POA: Diagnosis not present

## 2015-12-11 DIAGNOSIS — Z91199 Patient's noncompliance with other medical treatment and regimen due to unspecified reason: Secondary | ICD-10-CM

## 2015-12-11 DIAGNOSIS — Z7189 Other specified counseling: Secondary | ICD-10-CM

## 2015-12-11 DIAGNOSIS — Z9119 Patient's noncompliance with other medical treatment and regimen: Secondary | ICD-10-CM | POA: Diagnosis not present

## 2015-12-11 DIAGNOSIS — E11621 Type 2 diabetes mellitus with foot ulcer: Secondary | ICD-10-CM

## 2015-12-11 DIAGNOSIS — Z1159 Encounter for screening for other viral diseases: Secondary | ICD-10-CM

## 2015-12-11 DIAGNOSIS — Z7689 Persons encountering health services in other specified circumstances: Secondary | ICD-10-CM

## 2015-12-11 DIAGNOSIS — J4540 Moderate persistent asthma, uncomplicated: Secondary | ICD-10-CM

## 2015-12-11 MED ORDER — LOSARTAN POTASSIUM-HCTZ 100-25 MG PO TABS: 1.0000 | ORAL_TABLET | Freq: Every day | ORAL | Status: DC

## 2015-12-11 MED ORDER — FLUTICASONE FUROATE-VILANTEROL 100-25 MCG/INH IN AEPB: 1.0000 | INHALATION_SPRAY | Freq: Every day | RESPIRATORY_TRACT | Status: DC

## 2015-12-11 NOTE — Progress Notes (Signed)
Subjective:    Patient ID: [name], female    DOB: 1950-12-29, 65 y.o.   MRN: [id number]  HPI Patient is here today to establish care. She jokingly says that "she will be my worst nightmare."  Her sister is my patient. Patient has a long-standing history of insulin-dependent diabetes mellitus type 2. She is poorly compliant with medical therapy due to a combination of factors. In the past she has not had health insurance and therefore she has been unable to afford many of her medications. At the present time she is not taking many of her medications. At present she is taking metformin 1000 mg by mouth twice a day and Novolin 70/3070 units twice a day. However she states that more than half the time, she forgets to take the second dose of insulin in the afternoons. 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 They're performing debridements of necrotic tissue and bandaging the foot and plan to follow-up with her in 3 weeks. Her blood sugars, when checked, are all over the map and typically higher than 250. She also reports polyuria and some blurry vision. She denies any polydipsia. She denies any chest pain shortness of breath or dyspnea on exertion. She also has a long-standing history of hypertension. She's currently not taking any blood pressure medication. She is overdue for hepatitis C screening. She is due for her pneumonia vaccine. She's had Pneumovax 23 in the past per her report. She has not had Prevnar 13. She is long overdue for a colonoscopy, Pap smear, and mammogram. She refuses all these today and states that there is no way she will consent to them. She is also due for a bone density that again declines this as well. Past Medical History  Diagnosis Date  . Asthma   . Hypertension   . Diabetes mellitus without complication [facility])    Past Surgical History  Procedure Laterality Date  . Toe  amputation     Current Outpatient Prescriptions on File Prior to Visit  Medication Sig Dispense Refill  . insulin NPH-regular (NOVOLIN 70/30) (70-30) 100 UNIT/ML injection Inject 70 Units into the skin 2 (two) times daily with a meal.     . metFORMIN (GLUCOPHAGE) 1000 MG tablet Take 1,000 mg by mouth 2 (two) times daily with a meal.     No current facility-administered medications on file prior to visit.   Allergies  Allergen Reactions  . Penicillins Other (See Comments)    From childhood   Social History   Social History  . Marital Status: Single    Spouse Name: N/A  . Number of Children: N/A  . Years of Education: N/A   Occupational History  . Not on file.   Social History Main Topics  . Smoking status: Never Smoker   . Smokeless tobacco: Not on file  . Alcohol Use: Yes     Comment: once in a blue moon  . Drug Use: No  . Sexual Activity: Not on file   Other Topics Concern  . Not on file   Social History Narrative   Family History  Problem Relation Age of Onset  . Heart disease Mother   . Diabetes Sister       Review of Systems  All other systems reviewed and are negative.      Objective:   Physical Exam  Constitutional: She appears well-developed and well-nourished.  Neck: Neck supple. No JVD present. No thyromegaly present.  Cardiovascular: Normal rate, regular rhythm and normal heart sounds.  Exam reveals no gallop and no friction rub.   No murmur heard. Pulmonary/Chest: Effort normal and breath sounds normal. No respiratory distress. She has no wheezes. She has no rales.  Abdominal: Soft. Bowel sounds are normal. She exhibits no distension.  Musculoskeletal: She exhibits no edema.  Vitals reviewed.         Assessment & Plan:  Type 2 diabetes mellitus with foot ulcer, unspecified long term insulin use status ([place]) - Plan: COMPLETE METABOLIC PANEL WITH GFR, CBC with Differential/Platelet, Hemoglobin A1c, Lipid panel, Microalbumin, urine  Benign  essential HTN - Plan: losartan-hydrochlorothiazide (HYZAAR) 100-25 MG tablet  Need for hepatitis C screening test - Plan: Hepatitis C Ab Reflex HCV RNA, QUANT  Non compliance with medical treatment  Establishing care with new doctor, encounter for  Exam was limited today is more than 30 minutes was spent in discussion of her medical problems and making medical recommendations. First, I emphasized to the patient, that she will not be a nightmare for me. I also emphasized that I am here to help her. At first we need to focus on getting glycemic control. The patient drinks Pepsi and eats a very high carbohydrate diet. I recommended drastic reductions in her daily intake of carbs to less than 45 g per meal, discontinuation of all sodas and processed sugar and junk food. I recommended that she more vegetables and try to start exercising on a regular basis. I will check a hemoglobin A1c. If, as anticipated, her A1c is greater than 10, I will recommend switching Novolin 70/30 to [place]. Longer half-life would help due to her noncompliance. I would uptitrate her basal insulin until her fasting blood sugars are less than 130. I would then turned my attention to her postprandial sugars. Her blood pressures extremely high today, start Hyzaar 100/25 one by mouth daily. Check a urine microalbumin. Check a fasting lipid panel. Screen the patient for hepatitis C. She received Prevnar 13. Follow-up in 2 weeks to review her fasting blood sugars and two-hour postprandial sugars and recheck her blood pressure. Recommend a mammogram, colonoscopy, and Pap smear but she refused.

## 2015-12-12 ENCOUNTER — Encounter: Payer: Self-pay | Admitting: Family Medicine

## 2015-12-12 LAB — CBC WITH DIFFERENTIAL/PLATELET
Basophils Absolute: 0 cells/uL (ref 0–200)
Basophils Relative: 0 %
Eosinophils Absolute: 138 cells/uL (ref 15–500)
Eosinophils Relative: 2 %
HCT: 35.3 % (ref 35.0–45.0)
Hemoglobin: 11.9 g/dL — ABNORMAL LOW (ref 12.0–15.0)
Lymphocytes Relative: 25 %
Lymphs Abs: 1725 cells/uL (ref 850–3900)
MCH: 30.4 pg (ref 27.0–33.0)
MCHC: 33.7 g/dL (ref 32.0–36.0)
MCV: 90.3 fL (ref 80.0–100.0)
MPV: 9.7 fL (ref 7.5–12.5)
Monocytes Absolute: 414 cells/uL (ref 200–950)
Monocytes Relative: 6 %
Neutro Abs: 4623 cells/uL (ref 1500–7800)
Neutrophils Relative %: 67 %
Platelets: 180 10*3/uL (ref 140–400)
RBC: 3.91 MIL/uL (ref 3.80–5.10)
RDW: 14.1 % (ref 11.0–15.0)
WBC: 6.9 10*3/uL (ref 3.8–10.8)

## 2015-12-12 LAB — COMPLETE METABOLIC PANEL WITH GFR
ALT: 9 U/L (ref 6–29)
AST: 10 U/L (ref 10–35)
Albumin: 3.9 g/dL (ref 3.6–5.1)
Alkaline Phosphatase: 85 U/L (ref 33–130)
BUN: 30 mg/dL — ABNORMAL HIGH (ref 7–25)
CO2: 25 mmol/L (ref 20–31)
Calcium: 9.2 mg/dL (ref 8.6–10.4)
Chloride: 104 mmol/L (ref 98–110)
Creat: 1.53 mg/dL — ABNORMAL HIGH (ref 0.50–0.99)
GFR, Est African American: 41 mL/min — ABNORMAL LOW (ref >=60)
GFR, Est Non African American: 35 mL/min — ABNORMAL LOW (ref >=60)
Glucose, Bld: 121 mg/dL — ABNORMAL HIGH (ref 70–99)
Potassium: 4.4 mmol/L (ref 3.5–5.3)
Sodium: 141 mmol/L (ref 135–146)
Total Bilirubin: 0.4 mg/dL (ref 0.2–1.2)
Total Protein: 6.4 g/dL (ref 6.1–8.1)

## 2015-12-12 LAB — HEMOGLOBIN A1C
Hgb A1c MFr Bld: 9.8 % — ABNORMAL HIGH (ref <5.7)
Mean Plasma Glucose: 235 mg/dL

## 2015-12-12 LAB — LIPID PANEL
Cholesterol: 244 mg/dL — ABNORMAL HIGH (ref 125–200)
HDL: 42 mg/dL — ABNORMAL LOW (ref >=46)
LDL Cholesterol: 135 mg/dL — ABNORMAL HIGH (ref <130)
Total CHOL/HDL Ratio: 5.8 Ratio — ABNORMAL HIGH (ref <=5.0)
Triglycerides: 336 mg/dL — ABNORMAL HIGH (ref <150)
VLDL: 67 mg/dL — ABNORMAL HIGH (ref <30)

## 2015-12-12 LAB — HEPATITIS C ANTIBODY: HCV Ab: NEGATIVE

## 2015-12-15 ENCOUNTER — Encounter: Payer: Self-pay | Admitting: Podiatry

## 2015-12-15 ENCOUNTER — Ambulatory Visit (INDEPENDENT_AMBULATORY_CARE_PROVIDER_SITE_OTHER): Payer: Medicare Other | Admitting: Podiatry

## 2015-12-15 VITALS — BP 139/79 | HR 91 | Resp 14

## 2015-12-15 DIAGNOSIS — E1140 Type 2 diabetes mellitus with diabetic neuropathy, unspecified: Secondary | ICD-10-CM | POA: Diagnosis not present

## 2015-12-15 DIAGNOSIS — L97519 Non-pressure chronic ulcer of other part of right foot with unspecified severity: Secondary | ICD-10-CM | POA: Diagnosis not present

## 2015-12-15 DIAGNOSIS — E11621 Type 2 diabetes mellitus with foot ulcer: Secondary | ICD-10-CM | POA: Diagnosis not present

## 2015-12-15 NOTE — Progress Notes (Signed)
Subjective:     Patient ID: [name], female   DOB: [birth year], 65 y.o.   MRN: [id number]  HPIpatient presents with diabetic ulcer under ball of right foot.  .  She says the ulcer was doing well and decreasing in size.  .  .  She desires to continue with treatment to help heal her ulcer. She says she soaks and bandages her foot as described.   Review of Systems     Objective:   Physical Exam GENERAL APPEARANCE: Alert, conversant. Appropriately groomed. No acute distress.  VASCULAR: Pedal pulses palpable at 2/4 DP and PT bilateral.  Capillary refill time is immediate to all digits,  Proximal to distal cooling it warm to warm.  Digital hair growth is present bilateral  NEUROLOGIC: Diminished LOPS.    MUSCULOSKELETAL: acceptable muscle strength, tone and stability bilateral.  Intrinsic muscluature intact bilateral.  Rectus appearance of foot and digits noted bilateral.   DERMATOLOGIC: skin color, texture, and turgor are within normal limits.    Digital nails are asymptomatic. No drainage noted. ULCER  There is 12  x 10 mm.  diabetic ulcer with necrotic ring of tissue.  No redness or swelling noted. No malodor or drainage noted.  Granulation tissue at base of ulcer. No infection noted.     Assessment:     Diabetic Ulcer right foot     Plan:     Debride necrotic tissue down to fat.  Neosporin/DSD  Home instructions given.  RTC 3 weeks. The ulcer limited improvement since her last visit.Arne Cleveland DPM

## 2015-12-28 ENCOUNTER — Ambulatory Visit (INDEPENDENT_AMBULATORY_CARE_PROVIDER_SITE_OTHER): Payer: Medicare Other | Admitting: Family Medicine

## 2015-12-28 ENCOUNTER — Encounter: Payer: Self-pay | Admitting: Family Medicine

## 2015-12-28 VITALS — BP 160/90 | HR 86 | Temp 98.2°F | Resp 15 | Wt 250.0 lb

## 2015-12-28 DIAGNOSIS — L97509 Non-pressure chronic ulcer of other part of unspecified foot with unspecified severity: Secondary | ICD-10-CM | POA: Diagnosis not present

## 2015-12-28 DIAGNOSIS — E11621 Type 2 diabetes mellitus with foot ulcer: Secondary | ICD-10-CM | POA: Diagnosis not present

## 2015-12-28 DIAGNOSIS — Z9119 Patient's noncompliance with other medical treatment and regimen: Secondary | ICD-10-CM

## 2015-12-28 DIAGNOSIS — N183 Chronic kidney disease, stage 3 (moderate): Secondary | ICD-10-CM | POA: Diagnosis not present

## 2015-12-28 DIAGNOSIS — I10 Essential (primary) hypertension: Secondary | ICD-10-CM | POA: Diagnosis not present

## 2015-12-28 DIAGNOSIS — Z91199 Patient's noncompliance with other medical treatment and regimen due to unspecified reason: Secondary | ICD-10-CM

## 2015-12-28 MED ORDER — METFORMIN HCL 1000 MG PO TABS: 1000.0000 mg | ORAL_TABLET | Freq: Two times a day (BID) | ORAL | Status: DC

## 2015-12-28 MED ORDER — AMLODIPINE BESYLATE 10 MG PO TABS: 10.0000 mg | ORAL_TABLET | Freq: Every day | ORAL | Status: DC

## 2015-12-28 NOTE — Progress Notes (Signed)
Subjective:    Patient ID: [name], female    DOB: March 30, 1951, 65 y.o.   MRN: [id number]  HPI 12/11/15 Patient is here today to establish care. She jokingly says that "she will be my worst nightmare."  Her sister is my patient. Patient has a long-standing history of insulin-dependent diabetes mellitus type 2. She is poorly compliant with medical therapy due to a combination of factors. In the past she has not had health insurance and therefore she has been unable to afford many of her medications. At the present time she is not taking many of her medications. At present she is taking metformin 1000 mg by mouth twice a day and Novolin 70/30 70 units twice a day. However she states that more than half the time, she forgets to take the second dose of insulin in the afternoons. 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 They're performing debridements of necrotic tissue and bandaging the foot and plan to follow-up with her in 3 weeks. Her blood sugars, when checked, are all over the map and typically higher than 250. She also reports polyuria and some blurry vision. She denies any polydipsia. She denies any chest pain shortness of breath or dyspnea on exertion. She also has a long-standing history of hypertension. She's currently not taking any blood pressure medication. She is overdue for hepatitis C screening. She is due for her pneumonia vaccine. She's had Pneumovax 23 in the past per her report. She has not had Prevnar 13. She is long overdue for a colonoscopy, Pap smear, and mammogram. She refuses all these today and states that there is no way she will consent to them. She is also due for a bone density that again declines this as well.  At that time, my plan was: Exam was limited today as more than 30 minutes was spent in discussion of her medical problems and making medical recommendations. First, I emphasized  to the patient, that she will not be a nightmare for me. I also emphasized that I am here to help her. At first we need to focus on getting glycemic control. The patient drinks Pepsi and eats a very high carbohydrate diet. I recommended drastic reductions in her daily intake of carbs to less than 45 g per meal, discontinuation of all sodas and processed sugar and junk food. I recommended that she eat more vegetables and try to start exercising on a regular basis. I will check a hemoglobin A1c. If, as anticipated, her A1c is greater than 10, I will recommend switching Novolin 70/30 to [place]. Longer half-life would help due to her noncompliance. I would uptitrate her basal insulin until her fasting blood sugars are less than 130. I would then turn my attention to her postprandial sugars. Her blood pressure is extremely high today, start Hyzaar 100/25 one by mouth daily. Check a urine microalbumin. Check a fasting lipid panel. Screen the patient for hepatitis C. She received Prevnar 13. Follow-up in 2 weeks to review her fasting blood sugars and two-hour postprandial sugars and recheck her blood pressure. Recommend a mammogram, colonoscopy, and Pap smear but she refused.  12/28/15 Office Visit on 12/11/2015  Component Date Value Ref Range Status  . Sodium 12/11/2015 141  135 - 146 mmol/L Final  . Potassium 12/11/2015 4.4  3.5 - 5.3 mmol/L Final  . Chloride 12/11/2015 104  98 - 110 mmol/L Final  .  CO2 12/11/2015 25  20 - 31 mmol/L Final  . Glucose, Bld 12/11/2015 121* 70 - 99 mg/dL Final  . BUN 12/11/2015 30* 7 - 25 mg/dL Final  . Creat 12/11/2015 1.53* 0.50 - 0.99 mg/dL Final  . Total Bilirubin 12/11/2015 0.4  0.2 - 1.2 mg/dL Final  . Alkaline Phosphatase 12/11/2015 85  33 - 130 U/L Final  . AST 12/11/2015 10  10 - 35 U/L Final  . ALT 12/11/2015 9  6 - 29 U/L Final  . Total Protein 12/11/2015 6.4  6.1 - 8.1 g/dL Final  . Albumin 12/11/2015 3.9  3.6 - 5.1 g/dL Final  . Calcium 12/11/2015 9.2  8.6 -  10.4 mg/dL Final  . GFR, Est African American 12/11/2015 41* >=60 mL/min Final  . GFR, Est Non African American 12/11/2015 35* >=60 mL/min Final   Comment:   The estimated GFR is a calculation valid for adults (>=29 years old) that uses the CKD-EPI algorithm to adjust for age and sex. It is   not to be used for children, pregnant women, hospitalized patients,    patients on dialysis, or with rapidly changing kidney function. According to the NKDEP, eGFR >89 is normal, 60-89 shows mild impairment, 30-59 shows moderate impairment, 15-29 shows severe impairment and <15 is ESRD.     . WBC 12/11/2015 6.9  3.8 - 10.8 K/uL Final  . RBC 12/11/2015 3.91  3.80 - 5.10 MIL/uL Final  . Hemoglobin 12/11/2015 11.9* 12.0 - 15.0 g/dL Final  . HCT 12/11/2015 35.3  35.0 - 45.0 % Final  . MCV 12/11/2015 90.3  80.0 - 100.0 fL Final  . MCH 12/11/2015 30.4  27.0 - 33.0 pg Final  . MCHC 12/11/2015 33.7  32.0 - 36.0 g/dL Final  . RDW 12/11/2015 14.1  11.0 - 15.0 % Final  . Platelets 12/11/2015 180  140 - 400 K/uL Final  . MPV 12/11/2015 9.7  7.5 - 12.5 fL Final  . Neutro Abs 12/11/2015 4623  1500 - 7800 cells/uL Final  . Lymphs Abs 12/11/2015 1725  850 - 3900 cells/uL Final  . Monocytes Absolute 12/11/2015 414  200 - 950 cells/uL Final  . Eosinophils Absolute 12/11/2015 138  15 - 500 cells/uL Final  . Basophils Absolute 12/11/2015 0  0 - 200 cells/uL Final  . Neutrophils Relative % 12/11/2015 67   Final  . Lymphocytes Relative 12/11/2015 25   Final  . Monocytes Relative 12/11/2015 6   Final  . Eosinophils Relative 12/11/2015 2   Final  . Basophils Relative 12/11/2015 0   Final  . Smear Review 12/11/2015 Criteria for review not met   Final   ** Please note change in unit of measure and reference range(s). **  . Hgb A1c MFr Bld 12/11/2015 9.8* <5.7 % Final   Comment:   For someone without known diabetes, a hemoglobin A1c value of 6.5% or greater indicates that they may have diabetes and this should  be confirmed with a follow-up test.   For someone with known diabetes, a value <7% indicates that their diabetes is well controlled and a value greater than or equal to 7% indicates suboptimal control. A1c targets should be individualized based on duration of diabetes, age, comorbid conditions, and other considerations.   Currently, no consensus exists for use of hemoglobin A1c for diagnosis of diabetes for children.     . Mean Plasma Glucose 12/11/2015 235   Final  . Cholesterol 12/11/2015 244* 125 - 200 mg/dL Final  . Triglycerides  12/11/2015 336* <150 mg/dL Final  . HDL 12/11/2015 42* >=46 mg/dL Final  . Total CHOL/HDL Ratio 12/11/2015 5.8* <=5.0 Ratio Final  . VLDL 12/11/2015 67* <30 mg/dL Final  . LDL Cholesterol 12/11/2015 135* <130 mg/dL Final   Comment:   Total Cholesterol/HDL Ratio:CHD Risk                        Coronary Heart Disease Risk Table                                        Men       Women          1/2 Average Risk              3.4        3.3              Average Risk              5.0        4.4           2X Average Risk              9.6        7.1           3X Average Risk             23.4       11.0 Use the calculated Patient Ratio above and the CHD Risk table  to determine the patient's CHD Risk.   Marland Kitchen HCV Ab 12/11/2015 NEGATIVE  NEGATIVE Final   As anticipated, labs confirm that sugars were out of control. I recommended switching her 70/30 to [place] 120 units daily and then rechecking here today. Labs also revealed mild kidney disease as well as dyslipidemia. I recommended getting her blood pressures under control prior to addressing her cholesterol further. She is here today for follow-up.  She has not yet started [place].   Past Medical History  Diagnosis Date  . Asthma   . Hypertension   . Diabetes mellitus without complication [facility])    Past Surgical History  Procedure Laterality Date  . Toe amputation     Current Outpatient Prescriptions on File  Prior to Visit  Medication Sig Dispense Refill  . fluticasone furoate-vilanterol (BREO ELLIPTA) 100-25 MCG/INH AEPB Inhale 1 puff into the lungs daily. 1 each 11  . gabapentin (NEURONTIN) 300 MG capsule Take 300 mg by mouth at bedtime.    . insulin NPH-regular (NOVOLIN 70/30) (70-30) 100 UNIT/ML injection Inject 70 Units into the skin 2 (two) times daily with a meal.     . losartan-hydrochlorothiazide (HYZAAR) 100-25 MG tablet Take 1 tablet by mouth daily. 30 tablet 3  . metFORMIN (GLUCOPHAGE) 1000 MG tablet Take 1,000 mg by mouth 2 (two) times daily with a meal.     No current facility-administered medications on file prior to visit.   Allergies  Allergen Reactions  . Penicillins Other (See Comments)    From childhood   Social History   Social History  . Marital Status: Single    Spouse Name: N/A  . Number of Children: N/A  . Years of Education: N/A   Occupational History  . Not on file.   Social History Main Topics  . Smoking status: Never Smoker   . Smokeless tobacco: Not on file  . Alcohol Use:  Yes     Comment: once in a blue moon  . Drug Use: No  . Sexual Activity: Not on file   Other Topics Concern  . Not on file   Social History Narrative   Family History  Problem Relation Age of Onset  . Heart disease Mother   . Diabetes Sister       Review of Systems  All other systems reviewed and are negative.      Objective:   Physical Exam  Constitutional: She appears well-developed and well-nourished.  Neck: Neck supple. No JVD present. No thyromegaly present.  Cardiovascular: Normal rate, regular rhythm and normal heart sounds.  Exam reveals no gallop and no friction rub.   No murmur heard. Pulmonary/Chest: Effort normal and breath sounds normal. No respiratory distress. She has no wheezes. She has no rales.  Abdominal: Soft. Bowel sounds are normal. She exhibits no distension.  Musculoskeletal: She exhibits no edema.  Vitals reviewed.           Assessment & Plan:  Benign essential HTN - Plan: amLODipine (NORVASC) 10 MG tablet  Non compliance with medical treatment  Type 2 diabetes mellitus with foot ulcer, unspecified long term insulin use status (HCC)  CKD (chronic kidney disease), stage 3 (moderate)  Discontinue 70/30. Begin Triseba 120 units once daily. Recheck fasting blood sugars and two-hour postprandial sugars in 2 weeks. Add amlodipine 10 mg by mouth daily to her medications to better control her blood pressure. Kidney disease is borderline. We may need to discontinue metformin in the future should her creatinine worsened. However the present time I will continue the medication with close clinical monitoring

## 2015-12-29 ENCOUNTER — Encounter: Payer: Self-pay | Admitting: Family Medicine

## 2015-12-29 LAB — MICROALBUMIN, URINE: Microalb, Ur: 62.2 mg/dL

## 2016-01-05 ENCOUNTER — Ambulatory Visit (INDEPENDENT_AMBULATORY_CARE_PROVIDER_SITE_OTHER): Payer: Medicare Other | Admitting: Podiatry

## 2016-01-05 ENCOUNTER — Encounter: Payer: Self-pay | Admitting: Podiatry

## 2016-01-05 VITALS — BP 144/77 | HR 74 | Resp 16

## 2016-01-05 DIAGNOSIS — L89891 Pressure ulcer of other site, stage 1: Secondary | ICD-10-CM | POA: Diagnosis not present

## 2016-01-05 DIAGNOSIS — E1140 Type 2 diabetes mellitus with diabetic neuropathy, unspecified: Secondary | ICD-10-CM

## 2016-01-05 DIAGNOSIS — L97519 Non-pressure chronic ulcer of other part of right foot with unspecified severity: Principal | ICD-10-CM

## 2016-01-05 DIAGNOSIS — E11621 Type 2 diabetes mellitus with foot ulcer: Secondary | ICD-10-CM

## 2016-01-05 NOTE — Progress Notes (Signed)
Subjective:     Patient ID: [name], female   DOB: 01-04-1951, 65 y.o.   MRN: [id number]  HPIpatient presents with diabetic ulcer under ball of right foot.  .  She says the ulcer was doing well and decreasing in size.  .  .  She desires to continue with treatment to help heal her ulcer. She says she soaks and bandages her foot as described. She desires to continue with new topical which her sister makes.  Review of Systems     Objective:   Physical Exam GENERAL APPEARANCE: Alert, conversant. Appropriately groomed. No acute distress.  VASCULAR: Pedal pulses palpable at 2/4 DP and PT bilateral.  Capillary refill time is immediate to all digits,  Proximal to distal cooling it warm to warm.  Digital hair growth is present bilateral  NEUROLOGIC: Diminished LOPS.    MUSCULOSKELETAL: acceptable muscle strength, tone and stability bilateral.  Intrinsic muscluature intact bilateral.  Rectus appearance of foot and digits noted bilateral.   DERMATOLOGIC: skin color, texture, and turgor are within normal limits.    Digital nails are asymptomatic. No drainage noted. ULCER  There is 12  x 10 mm.  diabetic ulcer with absence  necrotic ring of tissue.  No redness or swelling noted. No malodor or drainage noted.  Granulation tissue at base of ulcer. No infection noted.     Assessment:     Diabetic Ulcer right foot     Plan:     Debride necrotic tissue down to fat.  Neosporin/DSD  Home instructions given.  RTC 3 weeks. The ulcer is looking better.  The callus ring is minimal and granulation tissue in base has thickened.  I  Gardiner Barefoot DPM

## 2016-01-19 ENCOUNTER — Encounter: Payer: Self-pay | Admitting: Podiatry

## 2016-01-19 ENCOUNTER — Ambulatory Visit (INDEPENDENT_AMBULATORY_CARE_PROVIDER_SITE_OTHER): Payer: Medicare Other | Admitting: Podiatry

## 2016-01-19 VITALS — BP 151/85 | HR 88 | Resp 16

## 2016-01-19 DIAGNOSIS — L89891 Pressure ulcer of other site, stage 1: Secondary | ICD-10-CM | POA: Diagnosis not present

## 2016-01-19 DIAGNOSIS — E11621 Type 2 diabetes mellitus with foot ulcer: Secondary | ICD-10-CM | POA: Diagnosis not present

## 2016-01-19 DIAGNOSIS — E1140 Type 2 diabetes mellitus with diabetic neuropathy, unspecified: Secondary | ICD-10-CM

## 2016-01-19 DIAGNOSIS — L97519 Non-pressure chronic ulcer of other part of right foot with unspecified severity: Principal | ICD-10-CM

## 2016-01-19 NOTE — Progress Notes (Signed)
Subjective:     Patient ID: [name], female   DOB: 1950/07/25, 65 y.o.   MRN: [id number]  HPIpatient presents with diabetic ulcer under ball of right foot.  .  She says the ulcer was doing well and decreasing in size.  .  .  She desires to continue with treatment to help heal her ulcer. She says she soaks and bandages her foot as described.   Review of Systems     Objective:   Physical Exam GENERAL APPEARANCE: Alert, conversant. Appropriately groomed. No acute distress.  VASCULAR: Pedal pulses palpable at 2/4 DP and PT bilateral.  Capillary refill time is immediate to all digits,  Proximal to distal cooling it warm to warm.  Digital hair growth is present bilateral  NEUROLOGIC: Diminished LOPS.    MUSCULOSKELETAL: acceptable muscle strength, tone and stability bilateral.  Intrinsic muscluature intact bilateral.  Rectus appearance of foot and digits noted bilateral.   DERMATOLOGIC: skin color, texture, and turgor are within normal limits.    Digital nails are asymptomatic. No drainage noted. ULCER  There is 9 x 10 mm.mm.  diabetic ulcer with absence  necrotic ring of tissue.  No redness or swelling noted. No malodor or drainage noted.  Granulation tissue at base of ulcer. No infection noted.     Assessment:     Diabetic Ulcer right foot     Plan:     Debride necrotic tissue down to fat.  Neosporin/DSD  Home instructions given.  RTC 3 weeks. The ulcer is looking better.  The callus ring is minimal and granulation tissue in base has thickened.  I  Gardiner Barefoot DPM

## 2016-02-06 ENCOUNTER — Encounter: Payer: Self-pay | Admitting: Podiatry

## 2016-02-06 ENCOUNTER — Ambulatory Visit (INDEPENDENT_AMBULATORY_CARE_PROVIDER_SITE_OTHER): Payer: Medicare Other | Admitting: Podiatry

## 2016-02-06 VITALS — BP 130/69 | HR 84 | Resp 16

## 2016-02-06 DIAGNOSIS — E1140 Type 2 diabetes mellitus with diabetic neuropathy, unspecified: Secondary | ICD-10-CM

## 2016-02-06 DIAGNOSIS — L89891 Pressure ulcer of other site, stage 1: Secondary | ICD-10-CM

## 2016-02-06 DIAGNOSIS — L97519 Non-pressure chronic ulcer of other part of right foot with unspecified severity: Principal | ICD-10-CM

## 2016-02-06 DIAGNOSIS — E11621 Type 2 diabetes mellitus with foot ulcer: Secondary | ICD-10-CM

## 2016-02-06 NOTE — Progress Notes (Signed)
Subjective:     Patient ID: [name], female   DOB: 05-14-1951, 65 y.o.   MRN: [id number]  HPIpatient presents with diabetic ulcer under ball of right foot.  .  She says the ulcer was doing well and decreasing in size.  .  .  She desires to continue with treatment to help heal her ulcer. She says she soaks and bandages her foot as described.   Review of Systems     Objective:   Physical Exam GENERAL APPEARANCE: Alert, conversant. Appropriately groomed. No acute distress.  VASCULAR: Pedal pulses palpable at 2/4 DP and PT bilateral.  Capillary refill time is immediate to all digits,  Proximal to distal cooling it warm to warm.  Digital hair growth is present bilateral  NEUROLOGIC: Diminished LOPS.    MUSCULOSKELETAL: acceptable muscle strength, tone and stability bilateral.  Intrinsic muscluature intact bilateral.  Rectus appearance of foot and digits noted bilateral.   DERMATOLOGIC: skin color, texture, and turgor are within normal limits.    Digital nails are asymptomatic. No drainage noted. ULCER  There is 5 mm. X 5 mm.  diabetic ulcer with absence  necrotic ring of tissue.  No redness or swelling noted. No malodor or drainage noted.  Granulation tissue at base of ulcer. No infection noted.     Assessment:     Diabetic Ulcer right foot     Plan:     Debride necrotic tissue down to fat.  Neosporin/DSD  Home instructions given.  RTC 3 weeks. The ulcer is looking better.  The callus ring is minimal and granulation tissue in base has thickened.  I  Gardiner Barefoot DPM

## 2016-02-26 ENCOUNTER — Ambulatory Visit (INDEPENDENT_AMBULATORY_CARE_PROVIDER_SITE_OTHER): Payer: Medicare Other | Admitting: Family Medicine

## 2016-02-26 ENCOUNTER — Encounter: Payer: Self-pay | Admitting: Family Medicine

## 2016-02-26 VITALS — BP 138/80 | HR 82 | Temp 98.2°F | Resp 18 | Ht 67.0 in | Wt 256.0 lb

## 2016-02-26 DIAGNOSIS — Z794 Long term (current) use of insulin: Secondary | ICD-10-CM | POA: Diagnosis not present

## 2016-02-26 DIAGNOSIS — E119 Type 2 diabetes mellitus without complications: Secondary | ICD-10-CM

## 2016-02-26 DIAGNOSIS — Z9119 Patient's noncompliance with other medical treatment and regimen: Secondary | ICD-10-CM | POA: Diagnosis not present

## 2016-02-26 DIAGNOSIS — N183 Chronic kidney disease, stage 3 (moderate): Secondary | ICD-10-CM | POA: Diagnosis not present

## 2016-02-26 DIAGNOSIS — I10 Essential (primary) hypertension: Secondary | ICD-10-CM | POA: Diagnosis not present

## 2016-02-26 DIAGNOSIS — Z91199 Patient's noncompliance with other medical treatment and regimen due to unspecified reason: Secondary | ICD-10-CM

## 2016-02-26 LAB — COMPLETE METABOLIC PANEL WITH GFR
ALT: 8 U/L (ref 6–29)
AST: 9 U/L — ABNORMAL LOW (ref 10–35)
Albumin: 3.9 g/dL (ref 3.6–5.1)
Alkaline Phosphatase: 72 U/L (ref 33–130)
BUN: 28 mg/dL — ABNORMAL HIGH (ref 7–25)
CO2: 23 mmol/L (ref 20–31)
Calcium: 8.9 mg/dL (ref 8.6–10.4)
Chloride: 107 mmol/L (ref 98–110)
Creat: 1.48 mg/dL — ABNORMAL HIGH (ref 0.50–0.99)
GFR, Est African American: 43 mL/min — ABNORMAL LOW (ref >=60)
GFR, Est Non African American: 37 mL/min — ABNORMAL LOW (ref >=60)
Glucose, Bld: 149 mg/dL — ABNORMAL HIGH (ref 70–99)
Potassium: 4.5 mmol/L (ref 3.5–5.3)
Sodium: 142 mmol/L (ref 135–146)
Total Bilirubin: 0.4 mg/dL (ref 0.2–1.2)
Total Protein: 6.4 g/dL (ref 6.1–8.1)

## 2016-02-26 LAB — CBC WITH DIFFERENTIAL/PLATELET
Basophils Absolute: 66 cells/uL (ref 0–200)
Basophils Relative: 1 %
Eosinophils Absolute: 132 cells/uL (ref 15–500)
Eosinophils Relative: 2 %
HCT: 34.6 % — ABNORMAL LOW (ref 35.0–45.0)
Hemoglobin: 11.5 g/dL — ABNORMAL LOW (ref 12.0–15.0)
Lymphocytes Relative: 21 %
Lymphs Abs: 1386 cells/uL (ref 850–3900)
MCH: 30.6 pg (ref 27.0–33.0)
MCHC: 33.2 g/dL (ref 32.0–36.0)
MCV: 92 fL (ref 80.0–100.0)
MPV: 10 fL (ref 7.5–12.5)
Monocytes Absolute: 396 cells/uL (ref 200–950)
Monocytes Relative: 6 %
Neutro Abs: 4620 cells/uL (ref 1500–7800)
Neutrophils Relative %: 70 %
Platelets: 195 10*3/uL (ref 140–400)
RBC: 3.76 MIL/uL — ABNORMAL LOW (ref 3.80–5.10)
RDW: 13.6 % (ref 11.0–15.0)
WBC: 6.6 10*3/uL (ref 3.8–10.8)

## 2016-02-26 LAB — LIPID PANEL
Cholesterol: 212 mg/dL — ABNORMAL HIGH (ref 125–200)
HDL: 41 mg/dL — ABNORMAL LOW (ref >=46)
LDL Cholesterol: 131 mg/dL — ABNORMAL HIGH (ref <130)
Total CHOL/HDL Ratio: 5.2 Ratio — ABNORMAL HIGH (ref <=5.0)
Triglycerides: 201 mg/dL — ABNORMAL HIGH (ref <150)
VLDL: 40 mg/dL — ABNORMAL HIGH (ref <30)

## 2016-02-26 NOTE — Progress Notes (Signed)
Subjective:    Patient ID: [name], female    DOB: 1951/02/05, 64 y.o.   MRN: [id number]  HPI5/22/17 Patient is here today to establish care. She jokingly says that "she will be my worst nightmare."  Her sister is my patient. Patient has a long-standing history of insulin-dependent diabetes mellitus type 2. She is poorly compliant with medical therapy due to a combination of factors. In the past she has not had health insurance and therefore she has been unable to afford many of her medications. At the present time she is not taking many of her medications. At present she is taking metformin 1000 mg by mouth twice a day and Novolin 70/30 70 units twice a day. However she states that more than half the time, she forgets to take the second dose of insulin in the afternoons. 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 They're performing debridements of necrotic tissue and bandaging the foot and plan to follow-up with her in 3 weeks. Her blood sugars, when checked, are all over the map and typically higher than 250. She also reports polyuria and some blurry vision. She denies any polydipsia. She denies any chest pain shortness of breath or dyspnea on exertion. She also has a long-standing history of hypertension. She's currently not taking any blood pressure medication. She is overdue for hepatitis C screening. She is due for her pneumonia vaccine. She's had Pneumovax 23 in the past per her report. She has not had Prevnar 13. She is long overdue for a colonoscopy, Pap smear, and mammogram. She refuses all these today and states that there is no way she will consent to them. She is also due for a bone density that again declines this as well.  At that time, my plan was: Exam was limited today as more than 30 minutes was spent in discussion of her medical problems and making medical recommendations. First, I emphasized  to the patient, that she will not be a nightmare for me. I also emphasized that I am here to help her. At first we need to focus on getting glycemic control. The patient drinks Pepsi and eats a very high carbohydrate diet. I recommended drastic reductions in her daily intake of carbs to less than 45 g per meal, discontinuation of all sodas and processed sugar and junk food. I recommended that she eat more vegetables and try to start exercising on a regular basis. I will check a hemoglobin A1c. If, as anticipated, her A1c is greater than 10, I will recommend switching Novolin 70/30 to [place]. Longer half-life would help due to her noncompliance. I would uptitrate her basal insulin until her fasting blood sugars are less than 130. I would then turn my attention to her postprandial sugars. Her blood pressure is extremely high today, start Hyzaar 100/25 one by mouth daily. Check a urine microalbumin. Check a fasting lipid panel. Screen the patient for hepatitis C. She received Prevnar 13. Follow-up in 2 weeks to review her fasting blood sugars and two-hour postprandial sugars and recheck her blood pressure. Recommend a mammogram, colonoscopy, and Pap smear but she refused.  12/28/15 As anticipated, labs confirm that sugars were out of control. I recommended switching her 70/30 to [place] 120 units daily and then rechecking here today. Labs also revealed mild kidney disease as well as dyslipidemia. I recommended getting her blood pressures under control prior to  addressing her cholesterol further. She is here today for follow-up.  She has not yet started [place].  At that time, my plan was: Discontinue 70/30. Begin Triseba 120 units once daily. Recheck fasting blood sugars and two-hour postprandial sugars in 2 weeks. Add amlodipine 10 mg by mouth daily to her medications to better control her blood pressure. Kidney disease is borderline. We may need to discontinue metformin in the future should her creatinine  worsened. However the present time I will continue the medication with close clinical monitoring  02/26/16 I have not heard from her since.  We have received no sugar readings with which to titrate triseba. Patient states that [place] didn't work.  [name] as were too high on it. Therefore she went back to taking Lantus 70 units twice a day. Her blood pressure however is better.  Past Medical History:  Diagnosis Date  . Asthma   . CKD stage 3 due to type 2 diabetes mellitus ([place])   . Hypertension   . Microalbuminuria due to type 2 diabetes mellitus ([place])   . 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 TOE AMPUTATION     Current Outpatient Prescriptions on File Prior to Visit  Medication Sig Dispense Refill  . amLODipine (NORVASC) 10 MG tablet Take 1 tablet (10 mg total) by mouth daily. 90 tablet 3  . fluticasone furoate-vilanterol (BREO ELLIPTA) 100-25 MCG/INH AEPB Inhale 1 puff into the lungs daily. 1 each 11  . gabapentin (NEURONTIN) 300 MG capsule Take 300 mg by mouth at bedtime.    . insulin NPH-regular (NOVOLIN 70/30) (70-30) 100 UNIT/ML injection Inject 70 Units into the skin 2 (two) times daily with a meal.     . losartan-hydrochlorothiazide (HYZAAR) 100-25 MG tablet Take 1 tablet by mouth daily. 30 tablet 3  . metFORMIN (GLUCOPHAGE) 1000 MG tablet Take 1 tablet (1,000 mg total) by mouth 2 (two) times daily with a meal. Reported on 12/28/2015 180 tablet 3   No current facility-administered medications on file prior to visit.    Allergies  Allergen Reactions  . Penicillins Other (See Comments)    From childhood   Social History   Social History  . Marital status: Single    Spouse name: N/A  . Number of children: N/A  . Years of education: N/A   Occupational History  . Not on file.   Social History Main Topics  . Smoking status: Never Smoker  . Smokeless tobacco: Never Used  . Alcohol use 0.0  oz/week     Comment: once in a blue moon  . Drug use: No  . Sexual activity: Not Currently    Partners: Male   Other Topics Concern  . Not on file   Social History Narrative  . No narrative on file   Family History  Problem Relation Age of Onset  . Heart disease Mother   . Diabetes Sister       Review of Systems  All other systems reviewed and are negative.      Objective:   Physical Exam  Constitutional: She appears well-developed and well-nourished.  Neck: Neck supple. No JVD present. No thyromegaly present.  Cardiovascular: Normal rate, regular rhythm and normal heart sounds.  Exam reveals no gallop and no friction rub.   No murmur heard. Pulmonary/Chest: Effort normal and breath sounds normal. No respiratory distress. She has no wheezes. She has no rales.  Abdominal: Soft. Bowel sounds are  normal. She exhibits no distension.  Musculoskeletal: She exhibits no edema.  Vitals reviewed.         Assessment & Plan:  Non compliance with medical treatment  Benign essential HTN  CKD (chronic kidney disease), stage 3 (moderate)  Diabetes mellitus, type II, insulin dependent ([place]) - Plan: CBC with Differential/Platelet, COMPLETE METABOLIC PANEL WITH GFR, Hemoglobin A1c, Lipid panel, Microalbumin, urine  I had a 15 minute discussion with the patient discussing her noncompliance. I explained to the patient that we underdosed her insulin and she was supposed to call me back with sugar values so that we can titrate it safely. The reason it did not help with her blood sugars because the dose was ineffective into low. Therefore I recommended we discontinue Lantus and replace it with Triseba 150 units daily.  She will call me with fasting blood sugars and two-hour postprandial sugars in one week so that we can titrate further I strongly encouraged the patient become compliant to prevent future complications. 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

## 2016-02-27 ENCOUNTER — Encounter: Payer: Self-pay | Admitting: Family Medicine

## 2016-02-27 LAB — HEMOGLOBIN A1C
Hgb A1c MFr Bld: 8.9 % — ABNORMAL HIGH (ref <5.7)
Mean Plasma Glucose: 209 mg/dL

## 2016-02-27 LAB — MICROALBUMIN, URINE: Microalb, Ur: 114.4 mg/dL

## 2016-02-28 ENCOUNTER — Telehealth: Payer: Self-pay | Admitting: *Deleted

## 2016-02-28 ENCOUNTER — Other Ambulatory Visit: Payer: Self-pay | Admitting: Family Medicine

## 2016-02-28 MED ORDER — PRAVASTATIN SODIUM 40 MG PO TABS: 40.0000 mg | ORAL_TABLET | Freq: Every day | ORAL | 3 refills | Status: DC

## 2016-02-28 MED ORDER — INSULIN DEGLUDEC 100 UNIT/ML ~~LOC~~ SOPN: 150.0000 [IU] | PEN_INJECTOR | Freq: Every day | SUBCUTANEOUS | 2 refills | Status: DC

## 2016-02-28 NOTE — Telephone Encounter (Signed)
Received request from pharmacy for [place] on [place].   PA submitted.   Dx: E11.9.  Received PA determination.   PA approved.

## 2016-03-01 ENCOUNTER — Telehealth: Payer: Self-pay | Admitting: Family Medicine

## 2016-03-01 ENCOUNTER — Encounter: Payer: Self-pay | Admitting: Podiatry

## 2016-03-01 ENCOUNTER — Ambulatory Visit (INDEPENDENT_AMBULATORY_CARE_PROVIDER_SITE_OTHER): Payer: Medicare Other | Admitting: Podiatry

## 2016-03-01 VITALS — BP 137/78 | HR 83 | Resp 16

## 2016-03-01 DIAGNOSIS — L97519 Non-pressure chronic ulcer of other part of right foot with unspecified severity: Principal | ICD-10-CM

## 2016-03-01 DIAGNOSIS — E11621 Type 2 diabetes mellitus with foot ulcer: Secondary | ICD-10-CM | POA: Diagnosis not present

## 2016-03-01 DIAGNOSIS — L89891 Pressure ulcer of other site, stage 1: Secondary | ICD-10-CM

## 2016-03-01 DIAGNOSIS — E1140 Type 2 diabetes mellitus with diabetic neuropathy, unspecified: Secondary | ICD-10-CM

## 2016-03-01 NOTE — Telephone Encounter (Signed)
Samples up front and pt's sister aware.

## 2016-03-01 NOTE — Progress Notes (Signed)
Subjective:     Patient ID: [name], female   DOB: [birth year], 65 y.o.   MRN: [id number]  HPIpatient presents with diabetic ulcer under ball of right foot.  .  She says the ulcer was doing well and decreasing in size.  .  .  She desires to continue with treatment to help heal her ulcer. She says she soaks and bandages her foot as described.   Review of Systems     Objective:   Physical Exam GENERAL APPEARANCE: Alert, conversant. Appropriately groomed. No acute distress.  VASCULAR: Pedal pulses palpable at 2/4 DP and PT bilateral.  Capillary refill time is immediate to all digits,  Proximal to distal cooling it warm to warm.  Digital hair growth is present bilateral  NEUROLOGIC: Diminished LOPS.    MUSCULOSKELETAL: acceptable muscle strength, tone and stability bilateral.  Intrinsic muscluature intact bilateral.  Rectus appearance of foot and digits noted bilateral.   DERMATOLOGIC: skin color, texture, and turgor are within normal limits.    Digital nails are asymptomatic. No drainage noted. ULCER  There is 5 mm. X 5 mm.  diabetic ulcer with absence  necrotic ring of tissue.  No redness or swelling noted. No malodor or drainage noted.  Granulation tissue at base of ulcer. No infection noted.     Assessment:     Diabetic Ulcer right foot     Plan:     Debride necrotic tissue down to fat.  Neosporin/DSD  Home instructions given.  RTC 3 weeks. The ulcer is looking better.  The callus ring is minimal and granulation tissue in base has thickened.  Dispense dispersion padding.I  Gardiner Barefoot DPM

## 2016-03-01 NOTE — Telephone Encounter (Signed)
Called and spoke to pharm and she will call insurance to see why they will not cover until the 17th. Informed them that this was a new medication and was not sent anywhere else.

## 2016-03-01 NOTE — Telephone Encounter (Signed)
Sister is calling patient is at another doctor's office and that is why she is calling. Would like to know if we have more samples of Insulin Degludec (TRESIBA FLEXTOUCH) 100 UNIT/ML SOPN She states that when her sister went to pick up prescription was told that it couldn't be filled until 03/07/16. Her Blood Sugar this morning was 127  [name] CB# [phone]

## 2016-03-26 ENCOUNTER — Encounter: Payer: Self-pay | Admitting: Podiatry

## 2016-03-26 ENCOUNTER — Ambulatory Visit (INDEPENDENT_AMBULATORY_CARE_PROVIDER_SITE_OTHER): Payer: Medicare Other | Admitting: Podiatry

## 2016-03-26 VITALS — BP 139/84 | HR 93 | Resp 14

## 2016-03-26 DIAGNOSIS — E11621 Type 2 diabetes mellitus with foot ulcer: Secondary | ICD-10-CM

## 2016-03-26 DIAGNOSIS — L89891 Pressure ulcer of other site, stage 1: Secondary | ICD-10-CM | POA: Diagnosis not present

## 2016-03-26 DIAGNOSIS — L97519 Non-pressure chronic ulcer of other part of right foot with unspecified severity: Principal | ICD-10-CM

## 2016-03-26 DIAGNOSIS — E1140 Type 2 diabetes mellitus with diabetic neuropathy, unspecified: Secondary | ICD-10-CM

## 2016-03-26 MED ORDER — SILVER SULFADIAZINE 1 % EX CREA: 1.0000 "application " | TOPICAL_CREAM | Freq: Every day | CUTANEOUS | 0 refills | Status: DC

## 2016-03-26 NOTE — Progress Notes (Signed)
Subjective:     Patient ID: [name], female   DOB: Sep 04, 1950, 65 y.o.   MRN: [id number]  HPIpatient presents with diabetic ulcer under ball of right foot.  .  She says the ulcer was doing well and decreasing in size.  .  .  She desires to continue with treatment to help heal her ulcer. She says she soaks and bandages her foot as described.   Review of Systems     Objective:   Physical Exam GENERAL APPEARANCE: Alert, conversant. Appropriately groomed. No acute distress.  VASCULAR: Pedal pulses palpable at 2/4 DP and PT bilateral.  Capillary refill time is immediate to all digits,  Proximal to distal cooling it warm to warm.  Digital hair growth is present bilateral  NEUROLOGIC: Diminished LOPS.    MUSCULOSKELETAL: acceptable muscle strength, tone and stability bilateral.  Intrinsic muscluature intact bilateral.  Rectus appearance of foot and digits noted bilateral.   DERMATOLOGIC: skin color, texture, and turgor are within normal limits.    Digital nails are asymptomatic. No drainage noted. ULCER  There is 5 mm. X 5 mm.  diabetic ulcer with absence  necrotic ring of tissue.  No redness or swelling noted. No malodor or drainage noted.  Granulation tissue at base of ulcer. No infection noted.     Assessment:     Diabetic Ulcer right foot     Plan:     Debride necrotic tissue down to fat.  Neosporin/DSD  Home instructions given.  RTC 3 weeks. The ulcer is looking better.  The callus ring is minimal and granulation tissue in base has thickened.    Gardiner Barefoot DPM

## 2016-03-26 NOTE — Addendum Note (Signed)
Addended by: Roney Jaffe on: 03/26/2016 03:10 PM   Modules accepted: Orders

## 2016-04-16 ENCOUNTER — Ambulatory Visit (INDEPENDENT_AMBULATORY_CARE_PROVIDER_SITE_OTHER): Payer: Medicare Other | Admitting: Podiatry

## 2016-04-16 ENCOUNTER — Encounter: Payer: Self-pay | Admitting: Podiatry

## 2016-04-16 VITALS — BP 164/94 | HR 90 | Resp 14

## 2016-04-16 DIAGNOSIS — E11621 Type 2 diabetes mellitus with foot ulcer: Secondary | ICD-10-CM

## 2016-04-16 DIAGNOSIS — L89891 Pressure ulcer of other site, stage 1: Secondary | ICD-10-CM

## 2016-04-16 DIAGNOSIS — E1140 Type 2 diabetes mellitus with diabetic neuropathy, unspecified: Secondary | ICD-10-CM

## 2016-04-16 DIAGNOSIS — L97519 Non-pressure chronic ulcer of other part of right foot with unspecified severity: Principal | ICD-10-CM

## 2016-04-16 NOTE — Progress Notes (Signed)
Subjective:     Patient ID: [name], female   DOB: [birth year], 65 y.o.   MRN: [id number]  HPIpatient presents with diabetic ulcer under ball of right foot.  .  She says the ulcer was doing well and decreasing in size.  .  .  . She says she soaks and bandages her foot as described.   Review of Systems     Objective:   Physical Exam GENERAL APPEARANCE: Alert, conversant. Appropriately groomed. No acute distress.  VASCULAR: Pedal pulses palpable at 2/4 DP and PT bilateral.  Capillary refill time is immediate to all digits,  Proximal to distal cooling it warm to warm.  Digital hair growth is present bilateral  NEUROLOGIC: Diminished LOPS.    MUSCULOSKELETAL: acceptable muscle strength, tone and stability bilateral.  Intrinsic muscluature intact bilateral.  Rectus appearance of foot and digits noted bilateral.   DERMATOLOGIC: skin color, texture, and turgor are within normal limits.    Digital nails are asymptomatic. No drainage noted. ULCER  There is 5 mm. X 5 mm.  diabetic ulcer with absence  necrotic ring of tissue.  No redness or swelling noted. No malodor or drainage noted.  Granulation tissue at base of ulcer. No infection noted.     Assessment:     Diabetic Ulcer right foot     Plan:     Debride necrotic tissue down to fat.  Neosporin/DSD  Home instructions given.  RTC 4  weeks. The ulcer is looking better.  The callus ring is minimal .    Gardiner Barefoot DPM

## 2016-04-23 ENCOUNTER — Other Ambulatory Visit: Payer: Self-pay | Admitting: Family Medicine

## 2016-04-23 DIAGNOSIS — I10 Essential (primary) hypertension: Secondary | ICD-10-CM

## 2016-04-29 ENCOUNTER — Other Ambulatory Visit: Payer: Self-pay | Admitting: Family Medicine

## 2016-05-17 ENCOUNTER — Ambulatory Visit (INDEPENDENT_AMBULATORY_CARE_PROVIDER_SITE_OTHER): Payer: Medicare Other | Admitting: Podiatry

## 2016-05-17 ENCOUNTER — Encounter: Payer: Self-pay | Admitting: Podiatry

## 2016-05-17 VITALS — BP 132/79 | HR 79 | Resp 14

## 2016-05-17 DIAGNOSIS — L97512 Non-pressure chronic ulcer of other part of right foot with fat layer exposed: Secondary | ICD-10-CM

## 2016-05-17 DIAGNOSIS — E1140 Type 2 diabetes mellitus with diabetic neuropathy, unspecified: Secondary | ICD-10-CM

## 2016-05-17 DIAGNOSIS — L89891 Pressure ulcer of other site, stage 1: Secondary | ICD-10-CM

## 2016-05-17 NOTE — Progress Notes (Signed)
Subjective:     Patient ID: [name], female   DOB: 1951/03/29, 65 y.o.   MRN: [id number]  HPIpatient presents with diabetic ulcer under ball of right foot.  .  She says the ulcer was doing well and decreasing in size.  .  .  . She says she soaks and bandages her foot as described.   Review of Systems     Objective:   Physical Exam GENERAL APPEARANCE: Alert, conversant. Appropriately groomed. No acute distress.  VASCULAR: Pedal pulses palpable at 2/4 DP and PT bilateral.  Capillary refill time is immediate to all digits,  Proximal to distal cooling it warm to warm.  Digital hair growth is present bilateral  NEUROLOGIC: Diminished LOPS.    MUSCULOSKELETAL: acceptable muscle strength, tone and stability bilateral.  Intrinsic muscluature intact bilateral.  Rectus appearance of foot and digits noted bilateral.   DERMATOLOGIC: skin color, texture, and turgor are within normal limits.    Digital nails are asymptomatic. No drainage noted. ULCER  There is 3 mm. X 3 mm.  diabetic ulcer with absence  necrotic ring of tissue.  No redness or swelling noted. No malodor or drainage noted.  Granulation tissue at base of ulcer. No infection noted.     Assessment:     Diabetic Ulcer right foot     Plan:     Debride necrotic tissue down to fat.  Neosporin/DSD  Home instructions given.  RTC 4  weeks. The ulcer is looking better.  The callus ring is minimal .    Gardiner Barefoot DPM

## 2016-06-10 ENCOUNTER — Ambulatory Visit (INDEPENDENT_AMBULATORY_CARE_PROVIDER_SITE_OTHER): Payer: Medicare Other | Admitting: Family Medicine

## 2016-06-10 ENCOUNTER — Encounter: Payer: Self-pay | Admitting: Family Medicine

## 2016-06-10 VITALS — BP 144/82 | HR 72 | Temp 98.0°F | Resp 18 | Ht 67.0 in | Wt 260.0 lb

## 2016-06-10 DIAGNOSIS — E119 Type 2 diabetes mellitus without complications: Secondary | ICD-10-CM | POA: Diagnosis not present

## 2016-06-10 DIAGNOSIS — I10 Essential (primary) hypertension: Secondary | ICD-10-CM

## 2016-06-10 DIAGNOSIS — Z794 Long term (current) use of insulin: Secondary | ICD-10-CM

## 2016-06-10 LAB — COMPLETE METABOLIC PANEL WITH GFR
ALT: 9 U/L (ref 6–29)
AST: 9 U/L — ABNORMAL LOW (ref 10–35)
Albumin: 3.9 g/dL (ref 3.6–5.1)
Alkaline Phosphatase: 77 U/L (ref 33–130)
BUN: 41 mg/dL — ABNORMAL HIGH (ref 7–25)
CO2: 26 mmol/L (ref 20–31)
Calcium: 9 mg/dL (ref 8.6–10.4)
Chloride: 107 mmol/L (ref 98–110)
Creat: 1.8 mg/dL — ABNORMAL HIGH (ref 0.50–0.99)
GFR, Est African American: 34 mL/min — ABNORMAL LOW (ref >=60)
GFR, Est Non African American: 29 mL/min — ABNORMAL LOW (ref >=60)
Glucose, Bld: 160 mg/dL — ABNORMAL HIGH (ref 70–99)
Potassium: 4.8 mmol/L (ref 3.5–5.3)
Sodium: 141 mmol/L (ref 135–146)
Total Bilirubin: 0.4 mg/dL (ref 0.2–1.2)
Total Protein: 6.5 g/dL (ref 6.1–8.1)

## 2016-06-10 LAB — LIPID PANEL
Cholesterol: 177 mg/dL (ref <200)
HDL: 39 mg/dL — ABNORMAL LOW (ref >50)
LDL Cholesterol: 98 mg/dL (ref <100)
Total CHOL/HDL Ratio: 4.5 Ratio (ref <5.0)
Triglycerides: 200 mg/dL — ABNORMAL HIGH (ref <150)
VLDL: 40 mg/dL — ABNORMAL HIGH (ref <30)

## 2016-06-10 LAB — CBC WITH DIFFERENTIAL/PLATELET
Basophils Absolute: 0 cells/uL (ref 0–200)
Basophils Relative: 0 %
Eosinophils Absolute: 308 cells/uL (ref 15–500)
Eosinophils Relative: 4 %
HCT: 33.8 % — ABNORMAL LOW (ref 35.0–45.0)
Hemoglobin: 11.4 g/dL — ABNORMAL LOW (ref 12.0–15.0)
Lymphocytes Relative: 20 %
Lymphs Abs: 1540 cells/uL (ref 850–3900)
MCH: 30.6 pg (ref 27.0–33.0)
MCHC: 33.7 g/dL (ref 32.0–36.0)
MCV: 90.6 fL (ref 80.0–100.0)
MPV: 9.8 fL (ref 7.5–12.5)
Monocytes Absolute: 308 cells/uL (ref 200–950)
Monocytes Relative: 4 %
Neutro Abs: 5544 cells/uL (ref 1500–7800)
Neutrophils Relative %: 72 %
Platelets: 197 10*3/uL (ref 140–400)
RBC: 3.73 MIL/uL — ABNORMAL LOW (ref 3.80–5.10)
RDW: 13.5 % (ref 11.0–15.0)
WBC: 7.7 10*3/uL (ref 3.8–10.8)

## 2016-06-10 MED ORDER — ALBUTEROL SULFATE HFA 108 (90 BASE) MCG/ACT IN AERS: 2.0000 | INHALATION_SPRAY | Freq: Four times a day (QID) | RESPIRATORY_TRACT | 4 refills | Status: DC | PRN

## 2016-06-10 NOTE — Progress Notes (Signed)
Subjective:    Patient ID: [name], female    DOB: 1951/02/05, 65 y.o.   MRN: [id number]  HPI5/22/17 Patient is here today to establish care. She jokingly says that "she will be my worst nightmare."  Her sister is my patient. Patient has a long-standing history of insulin-dependent diabetes mellitus type 2. She is poorly compliant with medical therapy due to a combination of factors. In the past she has not had health insurance and therefore she has been unable to afford many of her medications. At the present time she is not taking many of her medications. At present she is taking metformin 1000 mg by mouth twice a day and Novolin 70/30 70 units twice a day. However she states that more than half the time, she forgets to take the second dose of insulin in the afternoons. 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 They're performing debridements of necrotic tissue and bandaging the foot and plan to follow-up with her in 3 weeks. Her blood sugars, when checked, are all over the map and typically higher than 250. She also reports polyuria and some blurry vision. She denies any polydipsia. She denies any chest pain shortness of breath or dyspnea on exertion. She also has a long-standing history of hypertension. She's currently not taking any blood pressure medication. She is overdue for hepatitis C screening. She is due for her pneumonia vaccine. She's had Pneumovax 23 in the past per her report. She has not had Prevnar 13. She is long overdue for a colonoscopy, Pap smear, and mammogram. She refuses all these today and states that there is no way she will consent to them. She is also due for a bone density that again declines this as well.  At that time, my plan was: Exam was limited today as more than 30 minutes was spent in discussion of her medical problems and making medical recommendations. First, I emphasized  to the patient, that she will not be a nightmare for me. I also emphasized that I am here to help her. At first we need to focus on getting glycemic control. The patient drinks Pepsi and eats a very high carbohydrate diet. I recommended drastic reductions in her daily intake of carbs to less than 45 g per meal, discontinuation of all sodas and processed sugar and junk food. I recommended that she eat more vegetables and try to start exercising on a regular basis. I will check a hemoglobin A1c. If, as anticipated, her A1c is greater than 10, I will recommend switching Novolin 70/30 to [place]. Longer half-life would help due to her noncompliance. I would uptitrate her basal insulin until her fasting blood sugars are less than 130. I would then turn my attention to her postprandial sugars. Her blood pressure is extremely high today, start Hyzaar 100/25 one by mouth daily. Check a urine microalbumin. Check a fasting lipid panel. Screen the patient for hepatitis C. She received Prevnar 13. Follow-up in 2 weeks to review her fasting blood sugars and two-hour postprandial sugars and recheck her blood pressure. Recommend a mammogram, colonoscopy, and Pap smear but she refused.  12/28/15 As anticipated, labs confirm that sugars were out of control. I recommended switching her 70/30 to [place] 120 units daily and then rechecking here today. Labs also revealed mild kidney disease as well as dyslipidemia. I recommended getting her blood pressures under control prior to  addressing her cholesterol further. She is here today for follow-up.  She has not yet started [place].  At that time, my plan was: Discontinue 70/30. Begin Triseba 120 units once daily. Recheck fasting blood sugars and two-hour postprandial sugars in 2 weeks. Add amlodipine 10 mg by mouth daily to her medications to better control her blood pressure. Kidney disease is borderline. We may need to discontinue metformin in the future should her creatinine  worsened. However the present time I will continue the medication with close clinical monitoring  02/26/16 I have not heard from her since.  We have received no sugar readings with which to titrate triseba. Patient states that [place] didn't work.  Luiz Ochoa as were too high on it. Therefore she went back to taking Lantus 70 units twice a day. Her blood pressure however is better. At that time, my plan was: I had a 15 minute discussion with the patient discussing her noncompliance. I explained to the patient that we underdosed her insulin and she was supposed to call me back with sugar values so that we can titrate it safely. The reason it did not help with her blood sugars because the dose was ineffective into low. Therefore I recommended we discontinue Lantus and replace it with Triseba 150 units daily.  She will call me with fasting blood sugars and two-hour postprandial sugars in one week so that we can titrate further I strongly encouraged the patient become compliant to prevent future complications. 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  06/10/16 Since I last saw the patient, she has made a concerted effort to change her diet. She is now drinking less soft drinks. She is also trying to avoid doughnuts cakes and cookies although she admits that she still does indulge. She states that her fasting blood sugars are typically 150-200. She is not checking her sugars later in the day. She does report 6 month history of polyuria and increased urinary frequency. She denies dry mouth or thirst or blurry vision. She reports urge incontinence. She also gets the sudden urge to urinate and frequently cannot make it to the bathroom in time. She denies any chest pain shortness of breath or dyspnea on exertion. Past Medical History:  Diagnosis Date  . Asthma   . CKD stage 3 due to type 2 diabetes mellitus ([place])   . HLD (hyperlipidemia)   .  Hypertension   . Microalbuminuria due to type 2 diabetes mellitus ([place])   . 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 TOE AMPUTATION     Current Outpatient Prescriptions on File Prior to Visit  Medication Sig Dispense Refill  . amLODipine (NORVASC) 10 MG tablet Take 1 tablet (10 mg total) by mouth daily. 90 tablet 3  . fluticasone furoate-vilanterol (BREO ELLIPTA) 100-25 MCG/INH AEPB Inhale 1 puff into the lungs daily. 1 each 11  . gabapentin (NEURONTIN) 300 MG capsule Take 300 mg by mouth at bedtime.    Marland Kitchen losartan-hydrochlorothiazide (HYZAAR) 100-25 MG tablet TAKE ONE TABLET BY MOUTH ONCE DAILY 30 tablet 3  . metFORMIN (GLUCOPHAGE) 1000 MG tablet Take 1 tablet (1,000 mg total) by mouth 2 (two) times daily with a meal. Reported on 12/28/2015 180 tablet 3  . pravastatin (PRAVACHOL) 40 MG tablet Take 1 tablet (40 mg total) by mouth daily. 90 tablet 3  . silver sulfADIAZINE (SILVADENE)  1 % cream Apply 1 application topically daily. 50 g 0  . TRESIBA FLEXTOUCH 100 UNIT/ML SOPN FlexTouch Pen INJECT 150 UNITS INTO THE SKIN DAILY 15 pen 2  . insulin glargine (LANTUS) 100 UNIT/ML injection Inject 70 Units into the skin 2 (two) times daily.    . insulin NPH-regular (NOVOLIN 70/30) (70-30) 100 UNIT/ML injection Inject 70 Units into the skin 2 (two) times daily with a meal.      No current facility-administered medications on file prior to visit.    Allergies  Allergen Reactions  . Penicillins Other (See Comments)    From childhood   Social History   Social History  . Marital status: Single    Spouse name: N/A  . Number of children: N/A  . Years of education: N/A   Occupational History  . Not on file.   Social History Main Topics  . Smoking status: Never Smoker  . Smokeless tobacco: Never Used  . Alcohol use 0.0 oz/week     Comment: once in a blue moon  . Drug use: No  . Sexual activity: Not Currently     Partners: Male   Other Topics Concern  . Not on file   Social History Narrative  . No narrative on file   Family History  Problem Relation Age of Onset  . Heart disease Mother   . Diabetes Sister       Review of Systems  All other systems reviewed and are negative.      Objective:   Physical Exam  Constitutional: She appears well-developed and well-nourished.  Neck: Neck supple. No JVD present. No thyromegaly present.  Cardiovascular: Normal rate, regular rhythm and normal heart sounds.  Exam reveals no gallop and no friction rub.   No murmur heard. Pulmonary/Chest: Effort normal and breath sounds normal. No respiratory distress. She has no wheezes. She has no rales.  Abdominal: Soft. Bowel sounds are normal. She exhibits no distension.  Musculoskeletal: She exhibits no edema.  Vitals reviewed.         Assessment & Plan:  Diabetes mellitus, type II, insulin dependent ([place]) - Plan: CBC with Differential/Platelet, COMPLETE METABOLIC PANEL WITH GFR, Lipid panel, Hemoglobin A1c, Microalbumin, urine  Benign essential HTN  I am provided the patient for trying to check her diet. I will check a hemoglobin A1c. Goal hemoglobin A1c will be less than 7. The patient is already on 150 units of insulin a day. She is noncompliant with mealtime insulin. I would consider putting her on an insulin sensitizer such as Actos if A1c is greater than 7. I will also check a fasting lipid panel. Goal LDL cholesterol is less than 100. Blood pressure is  borderline but acceptable. Patient will try Myrbetriq 25 mg a day for overactive bladder

## 2016-06-11 LAB — HEMOGLOBIN A1C
Hgb A1c MFr Bld: 8.2 % — ABNORMAL HIGH (ref <5.7)
Mean Plasma Glucose: 189 mg/dL

## 2016-06-11 LAB — MICROALBUMIN, URINE: Microalb, Ur: 92 mg/dL

## 2016-06-17 ENCOUNTER — Other Ambulatory Visit: Payer: Self-pay | Admitting: Family Medicine

## 2016-06-20 ENCOUNTER — Other Ambulatory Visit: Payer: Self-pay | Admitting: Family Medicine

## 2016-06-21 ENCOUNTER — Ambulatory Visit (INDEPENDENT_AMBULATORY_CARE_PROVIDER_SITE_OTHER): Payer: Medicare Other | Admitting: Podiatry

## 2016-06-21 ENCOUNTER — Other Ambulatory Visit: Payer: Self-pay | Admitting: Family Medicine

## 2016-06-21 ENCOUNTER — Encounter: Payer: Self-pay | Admitting: Podiatry

## 2016-06-21 VITALS — BP 134/74 | HR 85

## 2016-06-21 DIAGNOSIS — L97512 Non-pressure chronic ulcer of other part of right foot with fat layer exposed: Secondary | ICD-10-CM

## 2016-06-21 DIAGNOSIS — E1140 Type 2 diabetes mellitus with diabetic neuropathy, unspecified: Secondary | ICD-10-CM | POA: Diagnosis not present

## 2016-06-21 MED ORDER — LIRAGLUTIDE 18 MG/3ML ~~LOC~~ SOPN: PEN_INJECTOR | SUBCUTANEOUS | 3 refills | Status: DC

## 2016-06-21 NOTE — Progress Notes (Signed)
Subjective:     Patient ID: [name], female   DOB: 1950/12/19, 65 y.o.   MRN: [id number]  HPIpatient presents with diabetic ulcer under ball of right foot.  .  She says the ulcer was doing well and decreasing in size.  .  .  . She says she soaks and bandages her foot as described.   Review of Systems     Objective:   Physical Exam GENERAL APPEARANCE: Alert, conversant. Appropriately groomed. No acute distress.  VASCULAR: Pedal pulses palpable at 2/4 DP and PT bilateral.  Capillary refill time is immediate to all digits,  Proximal to distal cooling it warm to warm.  Digital hair growth is present bilateral  NEUROLOGIC: Diminished LOPS.    MUSCULOSKELETAL: acceptable muscle strength, tone and stability bilateral.  Intrinsic muscluature intact bilateral.  Rectus appearance of foot and digits noted bilateral.   DERMATOLOGIC: skin color, texture, and turgor are within normal limits.    Digital nails are asymptomatic. No drainage noted. ULCER  There is pinhole opening   diabetic ulcer with absence  necrotic ring of tissue.  No redness or swelling noted. No malodor or drainage noted. No infection noted.     Assessment:     Diabetic Ulcer right foot     Plan:     Debride necrotic tissue   Neosporin/DSD  Home instructions given.  RTC 4  weeks. The ulcer is looking better.  The ulcer is closing nicely at this visit.    Gardiner Barefoot DPM

## 2016-06-26 ENCOUNTER — Telehealth: Payer: Self-pay | Admitting: Family Medicine

## 2016-06-26 NOTE — Telephone Encounter (Signed)
Submitted PA through [email or web address]   [facility] is reviewing your PA request. Typically an electronic response will be received within 72 hours.

## 2016-06-27 MED ORDER — LIRAGLUTIDE 18 MG/3ML ~~LOC~~ SOPN: PEN_INJECTOR | SUBCUTANEOUS | 3 refills | Status: DC

## 2016-06-27 NOTE — Telephone Encounter (Signed)
This is the response from PA on [email or web address] - Pharmacy aware.  This medication is on your plan's list of covered drugs. Prior authorization is not required at this time. If your pharmacy has questions regarding the processing of your prescription, please have them call the [facility] pharmacy help desk at ([phone]. For additional information, the member can contact Member Services by calling the number on the back of their ID Card.

## 2016-08-21 ENCOUNTER — Other Ambulatory Visit: Payer: Self-pay | Admitting: Family Medicine

## 2016-09-16 ENCOUNTER — Ambulatory Visit: Payer: Medicare Other | Admitting: Family Medicine

## 2016-09-24 ENCOUNTER — Ambulatory Visit (INDEPENDENT_AMBULATORY_CARE_PROVIDER_SITE_OTHER): Payer: Medicare Other | Admitting: Family Medicine

## 2016-09-24 ENCOUNTER — Encounter: Payer: Self-pay | Admitting: Family Medicine

## 2016-09-24 VITALS — BP 110/68 | HR 60 | Temp 98.0°F | Resp 18 | Ht 67.0 in | Wt 256.0 lb

## 2016-09-24 DIAGNOSIS — E7800 Pure hypercholesterolemia, unspecified: Secondary | ICD-10-CM

## 2016-09-24 DIAGNOSIS — Z9119 Patient's noncompliance with other medical treatment and regimen: Secondary | ICD-10-CM | POA: Diagnosis not present

## 2016-09-24 DIAGNOSIS — I10 Essential (primary) hypertension: Secondary | ICD-10-CM

## 2016-09-24 DIAGNOSIS — Z794 Long term (current) use of insulin: Secondary | ICD-10-CM

## 2016-09-24 DIAGNOSIS — E119 Type 2 diabetes mellitus without complications: Secondary | ICD-10-CM

## 2016-09-24 DIAGNOSIS — Z91199 Patient's noncompliance with other medical treatment and regimen due to unspecified reason: Secondary | ICD-10-CM

## 2016-09-24 LAB — COMPLETE METABOLIC PANEL WITH GFR
ALT: 8 U/L (ref 6–29)
AST: 9 U/L — ABNORMAL LOW (ref 10–35)
Albumin: 3.9 g/dL (ref 3.6–5.1)
Alkaline Phosphatase: 96 U/L (ref 33–130)
BUN: 37 mg/dL — ABNORMAL HIGH (ref 7–25)
CO2: 24 mmol/L (ref 20–31)
Calcium: 9 mg/dL (ref 8.6–10.4)
Chloride: 109 mmol/L (ref 98–110)
Creat: 2.12 mg/dL — ABNORMAL HIGH (ref 0.50–0.99)
GFR, Est African American: 28 mL/min — ABNORMAL LOW (ref >=60)
GFR, Est Non African American: 24 mL/min — ABNORMAL LOW (ref >=60)
Glucose, Bld: 83 mg/dL (ref 70–99)
Potassium: 4.3 mmol/L (ref 3.5–5.3)
Sodium: 144 mmol/L (ref 135–146)
Total Bilirubin: 0.4 mg/dL (ref 0.2–1.2)
Total Protein: 6.6 g/dL (ref 6.1–8.1)

## 2016-09-24 LAB — LIPID PANEL
Cholesterol: 168 mg/dL (ref <200)
HDL: 43 mg/dL — ABNORMAL LOW (ref >50)
LDL Cholesterol: 93 mg/dL (ref <100)
Total CHOL/HDL Ratio: 3.9 Ratio (ref <5.0)
Triglycerides: 158 mg/dL — ABNORMAL HIGH (ref <150)
VLDL: 32 mg/dL — ABNORMAL HIGH (ref <30)

## 2016-09-24 LAB — CBC WITH DIFFERENTIAL/PLATELET
Basophils Absolute: 0 cells/uL (ref 0–200)
Basophils Relative: 0 %
Eosinophils Absolute: 144 cells/uL (ref 15–500)
Eosinophils Relative: 2 %
HCT: 34.1 % — ABNORMAL LOW (ref 35.0–45.0)
Hemoglobin: 11.2 g/dL — ABNORMAL LOW (ref 12.0–15.0)
Lymphocytes Relative: 22 %
Lymphs Abs: 1584 cells/uL (ref 850–3900)
MCH: 29.6 pg (ref 27.0–33.0)
MCHC: 32.8 g/dL (ref 32.0–36.0)
MCV: 90 fL (ref 80.0–100.0)
MPV: 10.1 fL (ref 7.5–12.5)
Monocytes Absolute: 360 cells/uL (ref 200–950)
Monocytes Relative: 5 %
Neutro Abs: 5112 cells/uL (ref 1500–7800)
Neutrophils Relative %: 71 %
Platelets: 212 10*3/uL (ref 140–400)
RBC: 3.79 MIL/uL — ABNORMAL LOW (ref 3.80–5.10)
RDW: 13.8 % (ref 11.0–15.0)
WBC: 7.2 10*3/uL (ref 3.8–10.8)

## 2016-09-24 NOTE — Progress Notes (Signed)
Subjective:    Patient ID: [name], female    DOB: Mar 03, 1951, 66 y.o.   MRN: [id number]  Medication Refill   12/11/15 Patient is here today to establish care. She jokingly says that "she will be my worst nightmare."  Her sister is my patient. Patient has a long-standing history of insulin-dependent diabetes mellitus type 2. She is poorly compliant with medical therapy due to a combination of factors. In the past she has not had health insurance and therefore she has been unable to afford many of her medications. At the present time she is not taking many of her medications. At present she is taking metformin 1000 mg by mouth twice a day and Novolin 70/30 70 units twice a day. However she states that more than half the time, she forgets to take the second dose of insulin in the afternoons. 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 They're performing debridements of necrotic tissue and bandaging the foot and plan to follow-up with her in 3 weeks. Her blood sugars, when checked, are all over the map and typically higher than 250. She also reports polyuria and some blurry vision. She denies any polydipsia. She denies any chest pain shortness of breath or dyspnea on exertion. She also has a long-standing history of hypertension. She's currently not taking any blood pressure medication. She is overdue for hepatitis C screening. She is due for her pneumonia vaccine. She's had Pneumovax 23 in the past per her report. She has not had Prevnar 13. She is long overdue for a colonoscopy, Pap smear, and mammogram. She refuses all these today and states that there is no way she will consent to them. She is also due for a bone density that again declines this as well.  At that time, my plan was: Exam was limited today as more than 30 minutes was spent in discussion of her medical problems and making medical recommendations.  First, I emphasized to the patient, that she will not be a nightmare for me. I also emphasized that I am here to help her. At first we need to focus on getting glycemic control. The patient drinks Pepsi and eats a very high carbohydrate diet. I recommended drastic reductions in her daily intake of carbs to less than 45 g per meal, discontinuation of all sodas and processed sugar and junk food. I recommended that she eat more vegetables and try to start exercising on a regular basis. I will check a hemoglobin A1c. If, as anticipated, her A1c is greater than 10, I will recommend switching Novolin 70/30 to [place]. Longer half-life would help due to her noncompliance. I would uptitrate her basal insulin until her fasting blood sugars are less than 130. I would then turn my attention to her postprandial sugars. Her blood pressure is extremely high today, start Hyzaar 100/25 one by mouth daily. Check a urine microalbumin. Check a fasting lipid panel. Screen the patient for hepatitis C. She received Prevnar 13. Follow-up in 2 weeks to review her fasting blood sugars and two-hour postprandial sugars and recheck her blood pressure. Recommend a mammogram, colonoscopy, and Pap smear but she refused.  12/28/15 As anticipated, labs confirm that sugars were out of control. I recommended switching her 70/30 to [place] 120 units daily and then rechecking here today. Labs also revealed mild kidney disease as well as dyslipidemia. I recommended getting her blood pressures  under control prior to addressing her cholesterol further. She is here today for follow-up.  She has not yet started [place].  At that time, my plan was: Discontinue 70/30. Begin Triseba 120 units once daily. Recheck fasting blood sugars and two-hour postprandial sugars in 2 weeks. Add amlodipine 10 mg by mouth daily to her medications to better control her blood pressure. Kidney disease is borderline. We may need to discontinue metformin in the future should her  creatinine worsened. However the present time I will continue the medication with close clinical monitoring  02/26/16 I have not heard from her since.  We have received no sugar readings with which to titrate triseba. Patient states that [place] didn't work.  Luiz Ochoa as were too high on it. Therefore she went back to taking Lantus 70 units twice a day. Her blood pressure however is better. At that time, my plan was: I had a 15 minute discussion with the patient discussing her noncompliance. I explained to the patient that we underdosed her insulin and she was supposed to call me back with sugar values so that we can titrate it safely. The reason it did not help with her blood sugars because the dose was ineffective into low. Therefore I recommended we discontinue Lantus and replace it with Triseba 150 units daily.  She will call me with fasting blood sugars and two-hour postprandial sugars in one week so that we can titrate further I strongly encouraged the patient become compliant to prevent future complications. 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  06/10/16 Since I last saw the patient, she has made a concerted effort to change her diet. She is now drinking less soft drinks. She is also trying to avoid doughnuts cakes and cookies although she admits that she still does indulge. She states that her fasting blood sugars are typically 150-200. She is not checking her sugars later in the day. She does report 6 month history of polyuria and increased urinary frequency. She denies dry mouth or thirst or blurry vision. She reports urge incontinence. She also gets the sudden urge to urinate and frequently cannot make it to the bathroom in time. She denies any chest pain shortness of breath or dyspnea on exertion.  AT that time, my plan was: I am provided the patient for trying to check her diet. I will check a hemoglobin A1c. Goal  hemoglobin A1c will be less than 7. The patient is already on 150 units of insulin a day. She is noncompliant with mealtime insulin. I would consider putting her on an insulin sensitizer such as Actos if A1c is greater than 7. I will also check a fasting lipid panel. Goal LDL cholesterol is less than 100. Blood pressure is  borderline but acceptable. Patient will try Myrbetriq 25 mg a day for overactive bladder  09/24/16 Ultimately, the patient started Zeb Comfort is now on 135 units a day. She's also taking Victoza 1.2 mg a day in addition to her metformin. On this regimen, her fasting blood sugars between 101 130 5 in the morning which is outstanding for this individual. She seldom status hypoglycemia. She is not checking her sugar later in the day. Her blood pressure today is excellent 110/68. She denies any chest pain shortness of breath or dyspnea on exertion. Recommended annual diabetic eye exam. Diabetic foot exam is significant for severe neuropathy. She is missing the great toe on her right foot.  She is missing the great toe and second toe on her left foot. She has pre-ulcerative callus on the plantar aspect of the right first MTP joint. Past Medical History:  Diagnosis Date  . Asthma   . CKD stage 3 due to type 2 diabetes mellitus ([place])   . HLD (hyperlipidemia)   . Hypertension   . Microalbuminuria due to type 2 diabetes mellitus ([place])   . 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 TOE AMPUTATION     Current Outpatient Prescriptions on File Prior to Visit  Medication Sig Dispense Refill  . albuterol (PROVENTIL HFA;VENTOLIN HFA) 108 (90 Base) MCG/ACT inhaler Inhale 2 puffs into the lungs every 6 (six) hours as needed for wheezing or shortness of breath. 1 Inhaler 4  . amLODipine (NORVASC) 10 MG tablet Take 1 tablet (10 mg total) by mouth daily. 90 tablet 3  . fluticasone furoate-vilanterol (BREO ELLIPTA) 100-25 MCG/INH  AEPB Inhale 1 puff into the lungs daily. 1 each 11  . gabapentin (NEURONTIN) 300 MG capsule Take 300 mg by mouth at bedtime.    . liraglutide 18 MG/3ML SOPN 0.6 mg sq qd x 1 week then 1.2mg  sq qd 6 pen 3  . losartan-hydrochlorothiazide (HYZAAR) 100-25 MG tablet TAKE ONE TABLET BY MOUTH ONCE DAILY 30 tablet 3  . metFORMIN (GLUCOPHAGE) 1000 MG tablet Take 1 tablet (1,000 mg total) by mouth 2 (two) times daily with a meal. Reported on 12/28/2015 180 tablet 3  . pravastatin (PRAVACHOL) 40 MG tablet Take 1 tablet (40 mg total) by mouth daily. 90 tablet 3  . TRESIBA FLEXTOUCH 100 UNIT/ML SOPN FlexTouch Pen INJECT 150 UNITS SUBCUTANEOUSLY ONCE DAILY (Patient taking differently: INJECT 135 UNITS SUBCUTANEOUSLY ONCE DAILY) 15 pen 2   No current facility-administered medications on file prior to visit.    Allergies  Allergen Reactions  . Penicillins Other (See Comments)    From childhood   Social History   Social History  . Marital status: Single    Spouse name: N/A  . Number of children: N/A  . Years of education: N/A   Occupational History  . Not on file.   Social History Main Topics  . Smoking status: Never Smoker  . Smokeless tobacco: Never Used  . Alcohol use 0.0 oz/week     Comment: once in a blue moon  . Drug use: No  . Sexual activity: Not Currently    Partners: Male   Other Topics Concern  . Not on file   Social History Narrative  . No narrative on file   Family History  Problem Relation Age of Onset  . Heart disease Mother   . Diabetes Sister       Review of Systems  All other systems reviewed and are negative.      Objective:   Physical Exam  Constitutional: She appears well-developed and well-nourished.  Neck: Neck supple. No JVD present. No thyromegaly present.  Cardiovascular: Normal rate, regular rhythm and normal heart sounds.  Exam reveals no gallop and no friction rub.   No murmur heard. Pulmonary/Chest: Effort normal and breath sounds normal. No  respiratory distress. She has no wheezes. She has no rales.  Abdominal: Soft. Bowel sounds are normal. She exhibits no distension.  Musculoskeletal: She exhibits no edema.  Vitals reviewed.         Assessment & Plan:  Controlled type 2 diabetes mellitus without complication, with long-term current use of  insulin ([place]) - Plan: CBC with Differential/Platelet, COMPLETE METABOLIC PANEL WITH GFR, Lipid panel, Microalbumin, urine, Hemoglobin A1c  Benign essential HTN  Non compliance with medical treatment HLD  Blood pressure is excellent. We will make no changes in her blood pressure medication. I will check a hemoglobin A1c however her fasting blood sugar suggest excellent control her blood sugar. Continue to strongly encourage exercise and weight loss. She is extremely sedentary and we must remedied this to improve her life expectancy. Diabetic foot exam is up-to-date. Recommended annual diabetic eye exam. We will also check a fasting lipid panel. Goal LDL cholesterol is less than 100. We'll also check a urine microalbumin.  9 any myalgias or right upper quadrant pain on her pravastatin. She states that she is compliant with therapy.

## 2016-09-25 LAB — HEMOGLOBIN A1C
Hgb A1c MFr Bld: 7.8 % — ABNORMAL HIGH (ref <5.7)
Mean Plasma Glucose: 177 mg/dL

## 2016-09-25 LAB — MICROALBUMIN, URINE: Microalb, Ur: 64.5 mg/dL

## 2016-09-30 ENCOUNTER — Telehealth: Payer: Self-pay | Admitting: Family Medicine

## 2016-09-30 MED ORDER — LINAGLIPTIN 5 MG PO TABS: 5.0000 mg | ORAL_TABLET | Freq: Every day | ORAL | 2 refills | Status: DC

## 2016-09-30 NOTE — Telephone Encounter (Signed)
-----   Message from [name], MD sent at 09/26/2016  7:19 AM EST ----- Renal fcn is worse.  Please consult nephrology.  Stop metformin and replace with Tradjenta 5 mg poqday.

## 2016-09-30 NOTE — Telephone Encounter (Signed)
Pt aware of lab results and provider recommendations.  New Rx to pharmacy

## 2016-10-12 ENCOUNTER — Other Ambulatory Visit: Payer: Self-pay | Admitting: Family Medicine

## 2016-10-12 DIAGNOSIS — I10 Essential (primary) hypertension: Secondary | ICD-10-CM

## 2016-10-18 ENCOUNTER — Other Ambulatory Visit: Payer: Self-pay | Admitting: Family Medicine

## 2016-10-22 ENCOUNTER — Other Ambulatory Visit: Payer: Self-pay | Admitting: Family Medicine

## 2016-10-22 DIAGNOSIS — N289 Disorder of kidney and ureter, unspecified: Secondary | ICD-10-CM

## 2016-10-22 DIAGNOSIS — Z841 Family history of disorders of kidney and ureter: Secondary | ICD-10-CM

## 2016-11-07 LAB — HM DIABETES EYE EXAM

## 2016-11-18 ENCOUNTER — Other Ambulatory Visit: Payer: Self-pay | Admitting: Podiatry

## 2016-11-22 DIAGNOSIS — E1122 Type 2 diabetes mellitus with diabetic chronic kidney disease: Secondary | ICD-10-CM | POA: Diagnosis not present

## 2016-11-22 DIAGNOSIS — N2581 Secondary hyperparathyroidism of renal origin: Secondary | ICD-10-CM | POA: Diagnosis not present

## 2016-11-22 DIAGNOSIS — N183 Chronic kidney disease, stage 3 (moderate): Secondary | ICD-10-CM | POA: Diagnosis not present

## 2016-11-22 DIAGNOSIS — D631 Anemia in chronic kidney disease: Secondary | ICD-10-CM | POA: Diagnosis not present

## 2016-11-28 ENCOUNTER — Other Ambulatory Visit: Payer: Self-pay | Admitting: Family Medicine

## 2016-11-28 DIAGNOSIS — N183 Chronic kidney disease, stage 3 unspecified: Secondary | ICD-10-CM

## 2016-12-04 ENCOUNTER — Ambulatory Visit
Admission: RE | Admit: 2016-12-04 | Discharge: 2016-12-04 | Disposition: A | Discharge status: Home / Self Care | Payer: Medicare Other | Source: Ambulatory Visit | Attending: Family Medicine | Admitting: Family Medicine

## 2016-12-04 DIAGNOSIS — N183 Chronic kidney disease, stage 3 unspecified: Secondary | ICD-10-CM

## 2016-12-04 DIAGNOSIS — N200 Calculus of kidney: Secondary | ICD-10-CM | POA: Diagnosis not present

## 2016-12-04 DIAGNOSIS — N2889 Other specified disorders of kidney and ureter: Secondary | ICD-10-CM | POA: Diagnosis not present

## 2016-12-11 ENCOUNTER — Other Ambulatory Visit: Payer: Self-pay | Admitting: Nephrology

## 2016-12-11 DIAGNOSIS — N183 Chronic kidney disease, stage 3 unspecified: Secondary | ICD-10-CM

## 2016-12-13 ENCOUNTER — Encounter: Payer: Self-pay | Admitting: Family Medicine

## 2016-12-20 DIAGNOSIS — D49511 Neoplasm of unspecified behavior of right kidney: Secondary | ICD-10-CM | POA: Diagnosis not present

## 2016-12-24 ENCOUNTER — Other Ambulatory Visit: Payer: Self-pay | Admitting: Urology

## 2016-12-24 DIAGNOSIS — D49511 Neoplasm of unspecified behavior of right kidney: Secondary | ICD-10-CM

## 2016-12-30 ENCOUNTER — Ambulatory Visit (INDEPENDENT_AMBULATORY_CARE_PROVIDER_SITE_OTHER): Payer: Medicare Other | Admitting: Family Medicine

## 2016-12-30 VITALS — BP 138/64 | HR 78 | Temp 98.0°F | Resp 20 | Ht 67.0 in | Wt 255.0 lb

## 2016-12-30 DIAGNOSIS — Z23 Encounter for immunization: Secondary | ICD-10-CM | POA: Diagnosis not present

## 2016-12-30 DIAGNOSIS — E119 Type 2 diabetes mellitus without complications: Secondary | ICD-10-CM

## 2016-12-30 DIAGNOSIS — Z9119 Patient's noncompliance with other medical treatment and regimen: Secondary | ICD-10-CM

## 2016-12-30 DIAGNOSIS — I10 Essential (primary) hypertension: Secondary | ICD-10-CM

## 2016-12-30 DIAGNOSIS — Z794 Long term (current) use of insulin: Secondary | ICD-10-CM

## 2016-12-30 DIAGNOSIS — Z91199 Patient's noncompliance with other medical treatment and regimen due to unspecified reason: Secondary | ICD-10-CM

## 2016-12-30 LAB — COMPLETE METABOLIC PANEL WITH GFR
ALT: 9 U/L (ref 6–29)
AST: 10 U/L (ref 10–35)
Albumin: 4.1 g/dL (ref 3.6–5.1)
Alkaline Phosphatase: 98 U/L (ref 33–130)
BUN: 34 mg/dL — ABNORMAL HIGH (ref 7–25)
CO2: 23 mmol/L (ref 20–31)
Calcium: 8.7 mg/dL (ref 8.6–10.4)
Chloride: 106 mmol/L (ref 98–110)
Creat: 2.12 mg/dL — ABNORMAL HIGH (ref 0.50–0.99)
GFR, Est African American: 27 mL/min — ABNORMAL LOW (ref >=60)
GFR, Est Non African American: 24 mL/min — ABNORMAL LOW (ref >=60)
Glucose, Bld: 111 mg/dL — ABNORMAL HIGH (ref 70–99)
Potassium: 4.2 mmol/L (ref 3.5–5.3)
Sodium: 143 mmol/L (ref 135–146)
Total Bilirubin: 0.4 mg/dL (ref 0.2–1.2)
Total Protein: 7.1 g/dL (ref 6.1–8.1)

## 2016-12-30 LAB — LIPID PANEL
Cholesterol: 174 mg/dL (ref <200)
HDL: 35 mg/dL — ABNORMAL LOW (ref >50)
LDL Cholesterol: 97 mg/dL (ref <100)
Total CHOL/HDL Ratio: 5 Ratio — ABNORMAL HIGH (ref <5.0)
Triglycerides: 210 mg/dL — ABNORMAL HIGH (ref <150)
VLDL: 42 mg/dL — ABNORMAL HIGH (ref <30)

## 2016-12-30 MED ORDER — GABAPENTIN 300 MG PO CAPS: 300.0000 mg | ORAL_CAPSULE | Freq: Every day | ORAL | 2 refills | Status: DC

## 2016-12-30 NOTE — Progress Notes (Signed)
Subjective:    Patient ID: [name], female    DOB: [birth year], 66 y.o.   MRN: [id number]  Medication Refill   12/11/15 Patient is here today to establish care. She jokingly says that "she will be my worst nightmare."  Her sister is my patient. Patient has a long-standing history of insulin-dependent diabetes mellitus type 2. She is poorly compliant with medical therapy due to a combination of factors. In the past she has not had health insurance and therefore she has been unable to afford many of her medications. At the present time she is not taking many of her medications. At present she is taking metformin 1000 mg by mouth twice a day and Novolin 70/30 70 units twice a day. However she states that more than half the time, she forgets to take the second dose of insulin in the afternoons. 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 They're performing debridements of necrotic tissue and bandaging the foot and plan to follow-up with her in 3 weeks. Her blood sugars, when checked, are all over the map and typically higher than 250. She also reports polyuria and some blurry vision. She denies any polydipsia. She denies any chest pain shortness of breath or dyspnea on exertion. She also has a long-standing history of hypertension. She's currently not taking any blood pressure medication. She is overdue for hepatitis C screening. She is due for her pneumonia vaccine. She's had Pneumovax 23 in the past per her report. She has not had Prevnar 13. She is long overdue for a colonoscopy, Pap smear, and mammogram. She refuses all these today and states that there is no way she will consent to them. She is also due for a bone density that again declines this as well.  At that time, my plan was: Exam was limited today as more than 30 minutes was spent in discussion of her medical problems and making medical recommendations.  First, I emphasized to the patient, that she will not be a nightmare for me. I also emphasized that I am here to help her. At first we need to focus on getting glycemic control. The patient drinks Pepsi and eats a very high carbohydrate diet. I recommended drastic reductions in her daily intake of carbs to less than 45 g per meal, discontinuation of all sodas and processed sugar and junk food. I recommended that she eat more vegetables and try to start exercising on a regular basis. I will check a hemoglobin A1c. If, as anticipated, her A1c is greater than 10, I will recommend switching Novolin 70/30 to [place]. Longer half-life would help due to her noncompliance. I would uptitrate her basal insulin until her fasting blood sugars are less than 130. I would then turn my attention to her postprandial sugars. Her blood pressure is extremely high today, start Hyzaar 100/25 one by mouth daily. Check a urine microalbumin. Check a fasting lipid panel. Screen the patient for hepatitis C. She received Prevnar 13. Follow-up in 2 weeks to review her fasting blood sugars and two-hour postprandial sugars and recheck her blood pressure. Recommend a mammogram, colonoscopy, and Pap smear but she refused.  12/28/15 As anticipated, labs confirm that sugars were out of control. I recommended switching her 70/30 to [place] 120 units daily and then rechecking here today. Labs also revealed mild kidney disease as well as dyslipidemia. I recommended getting her blood pressures  under control prior to addressing her cholesterol further. She is here today for follow-up.  She has not yet started [place].  At that time, my plan was: Discontinue 70/30. Begin Triseba 120 units once daily. Recheck fasting blood sugars and two-hour postprandial sugars in 2 weeks. Add amlodipine 10 mg by mouth daily to her medications to better control her blood pressure. Kidney disease is borderline. We may need to discontinue metformin in the future should her  creatinine worsened. However the present time I will continue the medication with close clinical monitoring  02/26/16 I have not heard from her since.  We have received no sugar readings with which to titrate triseba. Patient states that [place] didn't work.  Luiz Ochoa as were too high on it. Therefore she went back to taking Lantus 70 units twice a day. Her blood pressure however is better. At that time, my plan was: I had a 15 minute discussion with the patient discussing her noncompliance. I explained to the patient that we underdosed her insulin and she was supposed to call me back with sugar values so that we can titrate it safely. The reason it did not help with her blood sugars because the dose was ineffective into low. Therefore I recommended we discontinue Lantus and replace it with Triseba 150 units daily.  She will call me with fasting blood sugars and two-hour postprandial sugars in one week so that we can titrate further I strongly encouraged the patient become compliant to prevent future complications. 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  06/10/16 Since I last saw the patient, she has made a concerted effort to change her diet. She is now drinking less soft drinks. She is also trying to avoid doughnuts cakes and cookies although she admits that she still does indulge. She states that her fasting blood sugars are typically 150-200. She is not checking her sugars later in the day. She does report 6 month history of polyuria and increased urinary frequency. She denies dry mouth or thirst or blurry vision. She reports urge incontinence. She also gets the sudden urge to urinate and frequently cannot make it to the bathroom in time. She denies any chest pain shortness of breath or dyspnea on exertion.  AT that time, my plan was: I am provided the patient for trying to check her diet. I will check a hemoglobin A1c. Goal  hemoglobin A1c will be less than 7. The patient is already on 150 units of insulin a day. She is noncompliant with mealtime insulin. I would consider putting her on an insulin sensitizer such as Actos if A1c is greater than 7. I will also check a fasting lipid panel. Goal LDL cholesterol is less than 100. Blood pressure is  borderline but acceptable. Patient will try Myrbetriq 25 mg a day for overactive bladder  09/24/16 Ultimately, the patient started Zeb Comfort is now on 135 units a day. She's also taking Victoza 1.2 mg a day in addition to her metformin. On this regimen, her fasting blood sugars between 101 130 5 in the morning which is outstanding for this individual. She seldom status hypoglycemia. She is not checking her sugar later in the day. Her blood pressure today is excellent 110/68. She denies any chest pain shortness of breath or dyspnea on exertion. Recommended annual diabetic eye exam. Diabetic foot exam is significant for severe neuropathy. She is missing the great toe on her right foot.  She is missing the great toe and second toe on her left foot. She has pre-ulcerative callus on the plantar aspect of the right first MTP joint.  At that time, my plan was:  Blood pressure is excellent. We will make no changes in her blood pressure medication. I will check a hemoglobin A1c however her fasting blood sugar suggest excellent control her blood sugar. Continue to strongly encourage exercise and weight loss. She is extremely sedentary and we must remedied this to improve her life expectancy. Diabetic foot exam is up-to-date. Recommended annual diabetic eye exam. We will also check a fasting lipid panel. Goal LDL cholesterol is less than 100. We'll also check a urine microalbumin.  She denies any myalgias or right upper quadrant pain on her pravastatin. She states that she is compliant with therapy.  12/30/16 Since the last time I saw the patient, she was found to have a mass on her right kidney. She is  scheduled to have an MRI later this month and follow-up with the urologist to determine if his cancerous. Obviously this has her worried. She has never had the pneumonia shot. I explained the patient that if there is the chance that she is going require hospitalization and surgery, she should really reconsider receiving this vaccine to reduce her likelihood of acquiring pneumonia postoperatively. Grudgingly, the patient consents. Her sugars have been elevated recently. Frequently she will go all day long without eating. She will then have hypoglycemic episodes. She will treat these with orange juice and peanut butter. She will also wake to take her medicine and then the following day her blood sugars will be high. I spent 15 minutes with the patient explaining the need for her to be consistent with her medications as well as her diet. She denies any myalgias or right upper quadrant pain. She is not exercising. She denies any chest pain and shortness of breath. Past Medical History:  Diagnosis Date  . Asthma   . CKD stage 3 due to type 2 diabetes mellitus ([place])   . HLD (hyperlipidemia)   . Hypertension   . Microalbuminuria due to type 2 diabetes mellitus ([place])   . 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 TOE AMPUTATION     Current Outpatient Prescriptions on File Prior to Visit  Medication Sig Dispense Refill  . albuterol (PROVENTIL HFA;VENTOLIN HFA) 108 (90 Base) MCG/ACT inhaler Inhale 2 puffs into the lungs every 6 (six) hours as needed for wheezing or shortness of breath. 1 Inhaler 4  . amLODipine (NORVASC) 10 MG tablet Take 1 tablet (10 mg total) by mouth daily. 90 tablet 3  . fluticasone furoate-vilanterol (BREO ELLIPTA) 100-25 MCG/INH AEPB Inhale 1 puff into the lungs daily. 1 each 11  . linagliptin (TRADJENTA) 5 MG TABS tablet Take 1 tablet (5 mg total) by mouth daily. 30 tablet 2  . liraglutide 18 MG/3ML SOPN 0.6  mg sq qd x 1 week then 1.2mg  sq qd 6 pen 3  . losartan-hydrochlorothiazide (HYZAAR) 100-25 MG tablet TAKE ONE TABLET BY MOUTH ONCE DAILY 90 tablet 3  . pravastatin (PRAVACHOL) 40 MG tablet Take 1 tablet (40 mg total) by mouth daily. 90 tablet 3  . SSD 1 % cream APPLY ONE APPLICATION TOPICALLY DAILY 50 g 0  . TRESIBA FLEXTOUCH 100 UNIT/ML SOPN FlexTouch Pen INJECT 150 UNITS SUBCUTANEOUSLY ONCE DAILY 15 pen 2  . vitamin A 10000 UNIT capsule Take 10,000 Units by  mouth daily.     No current facility-administered medications on file prior to visit.    Allergies  Allergen Reactions  . Penicillins Other (See Comments)    From childhood   Social History   Social History  . Marital status: Single    Spouse name: N/A  . Number of children: N/A  . Years of education: N/A   Occupational History  . Not on file.   Social History Main Topics  . Smoking status: Never Smoker  . Smokeless tobacco: Never Used  . Alcohol use 0.0 oz/week     Comment: once in a blue moon  . Drug use: No  . Sexual activity: Not Currently    Partners: Male   Other Topics Concern  . Not on file   Social History Narrative  . No narrative on file   Family History  Problem Relation Age of Onset  . Heart disease Mother   . Diabetes Sister       Review of Systems  All other systems reviewed and are negative.      Objective:   Physical Exam  Constitutional: She appears well-developed and well-nourished.  Neck: Neck supple. No JVD present. No thyromegaly present.  Cardiovascular: Normal rate, regular rhythm and normal heart sounds.  Exam reveals no gallop and no friction rub.   No murmur heard. Pulmonary/Chest: Effort normal and breath sounds normal. No respiratory distress. She has no wheezes. She has no rales.  Abdominal: Soft. Bowel sounds are normal. She exhibits no distension.  Musculoskeletal: She exhibits no edema.  Vitals reviewed.         Assessment & Plan:  Non compliance with medical  treatment  Diabetes mellitus, type II, insulin dependent ([facility]) - Plan: COMPLETE METABOLIC PANEL WITH GFR, Hemoglobin A1c, Microalbumin, urine, Lipid panel  Benign essential HTN  Need for prophylactic vaccination against Streptococcus pneumoniae (pneumococcus) - Plan: Pneumococcal polysaccharide vaccine 23-valent greater than or equal to 2yo subcutaneous/IM Patient received Pneumovax 23 today in clinic. Her blood pressure is acceptable. If she is going require partial nephrectomy, I would want to decrease or eliminate her use of hydrochlorothiazide to avoid nephrotoxic agents given the fact that she is already on losartan. She will call me back when she hears more from the urologist about her treatment options. Check a fasting lipid panel. Goal LDL cholesterol is less than 100. Also check hemoglobin A1c. Goal hemoglobin A1c is less than 7. I cautioned the patient about noncompliance. She must be consistent with her diet and not skip meals to avoid the risk of hypoglycemia.

## 2016-12-31 ENCOUNTER — Other Ambulatory Visit: Payer: Self-pay | Admitting: Family Medicine

## 2016-12-31 LAB — HEMOGLOBIN A1C
Hgb A1c MFr Bld: 7.8 % — ABNORMAL HIGH (ref <5.7)
Mean Plasma Glucose: 177 mg/dL

## 2016-12-31 LAB — MICROALBUMIN, URINE: Microalb, Ur: 47.9 mg/dL

## 2016-12-31 MED ORDER — LOSARTAN POTASSIUM 100 MG PO TABS: 100.0000 mg | ORAL_TABLET | Freq: Every day | ORAL | 3 refills | Status: DC

## 2017-01-01 ENCOUNTER — Ambulatory Visit (HOSPITAL_COMMUNITY)
Admission: RE | Admit: 2017-01-01 | Discharge: 2017-01-01 | Disposition: A | Discharge status: Home / Self Care | Payer: Medicare Other | Source: Ambulatory Visit | Attending: Urology | Admitting: Urology

## 2017-01-01 DIAGNOSIS — D49511 Neoplasm of unspecified behavior of right kidney: Secondary | ICD-10-CM | POA: Diagnosis present

## 2017-01-01 DIAGNOSIS — N2889 Other specified disorders of kidney and ureter: Secondary | ICD-10-CM | POA: Insufficient documentation

## 2017-01-01 DIAGNOSIS — K760 Fatty (change of) liver, not elsewhere classified: Secondary | ICD-10-CM | POA: Diagnosis not present

## 2017-01-01 DIAGNOSIS — E279 Disorder of adrenal gland, unspecified: Secondary | ICD-10-CM | POA: Diagnosis not present

## 2017-01-03 DIAGNOSIS — N183 Chronic kidney disease, stage 3 (moderate): Secondary | ICD-10-CM | POA: Diagnosis not present

## 2017-01-03 DIAGNOSIS — N2581 Secondary hyperparathyroidism of renal origin: Secondary | ICD-10-CM | POA: Diagnosis not present

## 2017-01-03 DIAGNOSIS — E1122 Type 2 diabetes mellitus with diabetic chronic kidney disease: Secondary | ICD-10-CM | POA: Diagnosis not present

## 2017-01-03 DIAGNOSIS — D631 Anemia in chronic kidney disease: Secondary | ICD-10-CM | POA: Diagnosis not present

## 2017-01-05 ENCOUNTER — Other Ambulatory Visit: Payer: Self-pay | Admitting: Family Medicine

## 2017-01-06 DIAGNOSIS — D49511 Neoplasm of unspecified behavior of right kidney: Secondary | ICD-10-CM | POA: Diagnosis not present

## 2017-01-06 DIAGNOSIS — Z0389 Encounter for observation for other suspected diseases and conditions ruled out: Secondary | ICD-10-CM | POA: Diagnosis not present

## 2017-01-10 ENCOUNTER — Encounter: Payer: Self-pay | Admitting: Family Medicine

## 2017-01-13 ENCOUNTER — Other Ambulatory Visit: Payer: Self-pay | Admitting: Urology

## 2017-01-20 ENCOUNTER — Ambulatory Visit (INDEPENDENT_AMBULATORY_CARE_PROVIDER_SITE_OTHER): Payer: Medicare Other | Admitting: Family Medicine

## 2017-01-20 ENCOUNTER — Encounter: Payer: Self-pay | Admitting: Family Medicine

## 2017-01-20 VITALS — BP 130/76 | HR 74 | Temp 98.9°F | Resp 20 | Ht 67.0 in | Wt 256.0 lb

## 2017-01-20 DIAGNOSIS — C641 Malignant neoplasm of right kidney, except renal pelvis: Secondary | ICD-10-CM | POA: Diagnosis not present

## 2017-01-20 NOTE — Progress Notes (Signed)
Subjective:    Patient ID: [name], female    DOB: 28-Dec-1950, 66 y.o.   MRN: [id number]  Medication Refill   12/11/15 Patient is here today to establish care. She jokingly says that "she will be my worst nightmare."  Her sister is my patient. Patient has a long-standing history of insulin-dependent diabetes mellitus type 2. She is poorly compliant with medical therapy due to a combination of factors. In the past she has not had health insurance and therefore she has been unable to afford many of her medications. At the present time she is not taking many of her medications. At present she is taking metformin 1000 mg by mouth twice a day and Novolin 70/30 70 units twice a day. However she states that more than half the time, she forgets to take the second dose of insulin in the afternoons. 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 They're performing debridements of necrotic tissue and bandaging the foot and plan to follow-up with her in 3 weeks. Her blood sugars, when checked, are all over the map and typically higher than 250. She also reports polyuria and some blurry vision. She denies any polydipsia. She denies any chest pain shortness of breath or dyspnea on exertion. She also has a long-standing history of hypertension. She's currently not taking any blood pressure medication. She is overdue for hepatitis C screening. She is due for her pneumonia vaccine. She's had Pneumovax 23 in the past per her report. She has not had Prevnar 13. She is long overdue for a colonoscopy, Pap smear, and mammogram. She refuses all these today and states that there is no way she will consent to them. She is also due for a bone density that again declines this as well.  At that time, my plan was: Exam was limited today as more than 30 minutes was spent in discussion of her medical problems and making medical recommendations.  First, I emphasized to the patient, that she will not be a nightmare for me. I also emphasized that I am here to help her. At first we need to focus on getting glycemic control. The patient drinks Pepsi and eats a very high carbohydrate diet. I recommended drastic reductions in her daily intake of carbs to less than 45 g per meal, discontinuation of all sodas and processed sugar and junk food. I recommended that she eat more vegetables and try to start exercising on a regular basis. I will check a hemoglobin A1c. If, as anticipated, her A1c is greater than 10, I will recommend switching Novolin 70/30 to [place]. Longer half-life would help due to her noncompliance. I would uptitrate her basal insulin until her fasting blood sugars are less than 130. I would then turn my attention to her postprandial sugars. Her blood pressure is extremely high today, start Hyzaar 100/25 one by mouth daily. Check a urine microalbumin. Check a fasting lipid panel. Screen the patient for hepatitis C. She received Prevnar 13. Follow-up in 2 weeks to review her fasting blood sugars and two-hour postprandial sugars and recheck her blood pressure. Recommend a mammogram, colonoscopy, and Pap smear but she refused.  12/28/15 As anticipated, labs confirm that sugars were out of control. I recommended switching her 70/30 to [place] 120 units daily and then rechecking here today. Labs also revealed mild kidney disease as well as dyslipidemia. I recommended getting her blood pressures  under control prior to addressing her cholesterol further. She is here today for follow-up.  She has not yet started [place].  At that time, my plan was: Discontinue 70/30. Begin Triseba 120 units once daily. Recheck fasting blood sugars and two-hour postprandial sugars in 2 weeks. Add amlodipine 10 mg by mouth daily to her medications to better control her blood pressure. Kidney disease is borderline. We may need to discontinue metformin in the future should her  creatinine worsened. However the present time I will continue the medication with close clinical monitoring  02/26/16 I have not heard from her since.  We have received no sugar readings with which to titrate triseba. Patient states that [place] didn't work.  Luiz Ochoa as were too high on it. Therefore she went back to taking Lantus 70 units twice a day. Her blood pressure however is better. At that time, my plan was: I had a 15 minute discussion with the patient discussing her noncompliance. I explained to the patient that we underdosed her insulin and she was supposed to call me back with sugar values so that we can titrate it safely. The reason it did not help with her blood sugars because the dose was ineffective into low. Therefore I recommended we discontinue Lantus and replace it with Triseba 150 units daily.  She will call me with fasting blood sugars and two-hour postprandial sugars in one week so that we can titrate further I strongly encouraged the patient become compliant to prevent future complications. 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  06/10/16 Since I last saw the patient, she has made a concerted effort to change her diet. She is now drinking less soft drinks. She is also trying to avoid doughnuts cakes and cookies although she admits that she still does indulge. She states that her fasting blood sugars are typically 150-200. She is not checking her sugars later in the day. She does report 6 month history of polyuria and increased urinary frequency. She denies dry mouth or thirst or blurry vision. She reports urge incontinence. She also gets the sudden urge to urinate and frequently cannot make it to the bathroom in time. She denies any chest pain shortness of breath or dyspnea on exertion.  AT that time, my plan was: I am provided the patient for trying to check her diet. I will check a hemoglobin A1c. Goal  hemoglobin A1c will be less than 7. The patient is already on 150 units of insulin a day. She is noncompliant with mealtime insulin. I would consider putting her on an insulin sensitizer such as Actos if A1c is greater than 7. I will also check a fasting lipid panel. Goal LDL cholesterol is less than 100. Blood pressure is  borderline but acceptable. Patient will try Myrbetriq 25 mg a day for overactive bladder  09/24/16 Ultimately, the patient started Zeb Comfort is now on 135 units a day. She's also taking Victoza 1.2 mg a day in addition to her metformin. On this regimen, her fasting blood sugars between 101 130 5 in the morning which is outstanding for this individual. She seldom status hypoglycemia. She is not checking her sugar later in the day. Her blood pressure today is excellent 110/68. She denies any chest pain shortness of breath or dyspnea on exertion. Recommended annual diabetic eye exam. Diabetic foot exam is significant for severe neuropathy. She is missing the great toe on her right foot.  She is missing the great toe and second toe on her left foot. She has pre-ulcerative callus on the plantar aspect of the right first MTP joint.  At that time, my plan was:  Blood pressure is excellent. We will make no changes in her blood pressure medication. I will check a hemoglobin A1c however her fasting blood sugar suggest excellent control her blood sugar. Continue to strongly encourage exercise and weight loss. She is extremely sedentary and we must remedied this to improve her life expectancy. Diabetic foot exam is up-to-date. Recommended annual diabetic eye exam. We will also check a fasting lipid panel. Goal LDL cholesterol is less than 100. We'll also check a urine microalbumin.  She denies any myalgias or right upper quadrant pain on her pravastatin. She states that she is compliant with therapy.  12/30/16 Since the last time I saw the patient, she was found to have a mass on her right kidney. She is  scheduled to have an MRI later this month and follow-up with the urologist to determine if his cancerous. Obviously this has her worried. She has never had the pneumonia shot. I explained the patient that if there is the chance that she is going require hospitalization and surgery, she should really reconsider receiving this vaccine to reduce her likelihood of acquiring pneumonia postoperatively. Grudgingly, the patient consents. Her sugars have been elevated recently. Frequently she will go all day long without eating. She will then have hypoglycemic episodes. She will treat these with orange juice and peanut butter. She will also wake to take her medicine and then the following day her blood sugars will be high. I spent 15 minutes with the patient explaining the need for her to be consistent with her medications as well as her diet. She denies any myalgias or right upper quadrant pain. She is not exercising. She denies any chest pain and shortness of breath.  AT that time, my plan was: Patient received Pneumovax 23 today in clinic. Her blood pressure is acceptable. If she is going require partial nephrectomy, I would want to decrease or eliminate her use of hydrochlorothiazide to avoid nephrotoxic agents given the fact that she is already on losartan. She will call me back when she hears more from the urologist about her treatment options. Check a fasting lipid panel. Goal LDL cholesterol is less than 100. Also check hemoglobin A1c. Goal hemoglobin A1c is less than 7. I cautioned the patient about noncompliance. She must be consistent with her diet and not skip meals to avoid the risk of hypoglycemia.   01/20/17 Patient recently had an MRI that confirmed a mass on her right kidney appears to be renal cell carcinoma. Her urologist has recommended a right total nephrectomy. However there was also the coincidental finding of an approximately 3 cm mass on her left adrenal gland. Metastasis cannot be ruled out. They  recommended removal of the adrenal gland and biopsy of this lesion. She is here today to discuss further. Since these findings, we have discontinued hydrochlorothiazide and started her on amlodipine. Her blood pressure today is much better controlled. Most recent lab work as listed below: Office Visit on 12/30/2016  Component Date Value Ref Range Status  . Sodium 12/30/2016 143  135 - 146 mmol/L Final  . Potassium 12/30/2016 4.2  3.5 - 5.3 mmol/L Final  . Chloride 12/30/2016 106  98 - 110 mmol/L Final  . CO2 12/30/2016 23  20 - 31 mmol/L Final  . Glucose, Bld 12/30/2016 111* 70 -  99 mg/dL Final  . BUN 12/30/2016 34* 7 - 25 mg/dL Final  . Creat 12/30/2016 2.12* 0.50 - 0.99 mg/dL Final   Comment:   For patients > or = 66 years of age: The upper reference limit for Creatinine is approximately 13% higher for people identified as African-American.     . Total Bilirubin 12/30/2016 0.4  0.2 - 1.2 mg/dL Final  . Alkaline Phosphatase 12/30/2016 98  33 - 130 U/L Final  . AST 12/30/2016 10  10 - 35 U/L Final  . ALT 12/30/2016 9  6 - 29 U/L Final  . Total Protein 12/30/2016 7.1  6.1 - 8.1 g/dL Final  . Albumin 12/30/2016 4.1  3.6 - 5.1 g/dL Final  . Calcium 12/30/2016 8.7  8.6 - 10.4 mg/dL Final  . GFR, Est African American 12/30/2016 27* >=60 mL/min Final  . GFR, Est Non African American 12/30/2016 24* >=60 mL/min Final  . Hgb A1c MFr Bld 12/30/2016 7.8* <5.7 % Final   Comment:   For someone without known diabetes, a hemoglobin A1c value of 6.5% or greater indicates that they may have diabetes and this should be confirmed with a follow-up test.   For someone with known diabetes, a value <7% indicates that their diabetes is well controlled and a value greater than or equal to 7% indicates suboptimal control. A1c targets should be individualized based on duration of diabetes, age, comorbid conditions, and other considerations.   Currently, no consensus exists for use of hemoglobin A1c for  diagnosis of diabetes for children.     . Mean Plasma Glucose 12/30/2016 177  mg/dL Final  . Microalb, Ur 12/30/2016 47.9  Not estab mg/dL Final   Comment: Result confirmed by automatic dilution. Result repeated and verified. The ADA has defined abnormalities in albumin excretion as follows:           Category           Result                            (mcg/mg creatinine)                 Normal:    <30       Microalbuminuria:    30 - 299   Clinical albuminuria:    > or = 300   The ADA recommends that at least two of three specimens collected within a 3 - 6 month period be abnormal before considering a patient to be within a diagnostic category.     . Cholesterol 12/30/2016 174  <200 mg/dL Final  . Triglycerides 12/30/2016 210* <150 mg/dL Final  . HDL 12/30/2016 35* >50 mg/dL Final  . Total CHOL/HDL Ratio 12/30/2016 5.0* <5.0 Ratio Final  . VLDL 12/30/2016 42* <30 mg/dL Final  . LDL Cholesterol 12/30/2016 97  <100 mg/dL Final    Past Medical History:  Diagnosis Date  . Asthma   . CKD stage 3 due to type 2 diabetes mellitus ([place])   . HLD (hyperlipidemia)   . Hypertension   . Microalbuminuria due to type 2 diabetes mellitus ([place])   . Renal cell cancer ([place])   . 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 TOE AMPUTATION     Current Outpatient Prescriptions on File Prior to Visit  Medication Sig Dispense Refill  . albuterol (PROVENTIL HFA;VENTOLIN HFA) 108 (90 Base) MCG/ACT inhaler  Inhale 2 puffs into the lungs every 6 (six) hours as needed for wheezing or shortness of breath. 1 Inhaler 4  . amLODipine (NORVASC) 10 MG tablet Take 1 tablet (10 mg total) by mouth daily. 90 tablet 3  . fluticasone furoate-vilanterol (BREO ELLIPTA) 100-25 MCG/INH AEPB Inhale 1 puff into the lungs daily. 1 each 11  . gabapentin (NEURONTIN) 300 MG capsule Take 1 capsule (300 mg total) by mouth at bedtime. 30 capsule 2  .  liraglutide 18 MG/3ML SOPN 0.6 mg sq qd x 1 week then 1.2mg  sq qd 6 pen 3  . losartan (COZAAR) 100 MG tablet Take 1 tablet (100 mg total) by mouth daily. DC Hyzaar 90 tablet 3  . pravastatin (PRAVACHOL) 40 MG tablet Take 1 tablet (40 mg total) by mouth daily. 90 tablet 3  . SSD 1 % cream APPLY ONE APPLICATION TOPICALLY DAILY 50 g 0  . TRADJENTA 5 MG TABS tablet TAKE 1 TABLET BY MOUTH ONCE DAILY 30 tablet 2  . TRESIBA FLEXTOUCH 100 UNIT/ML SOPN FlexTouch Pen INJECT 150 UNITS SUBCUTANEOUSLY ONCE DAILY 15 pen 2  . vitamin A 10000 UNIT capsule Take 10,000 Units by mouth daily.     No current facility-administered medications on file prior to visit.    Allergies  Allergen Reactions  . Penicillins Other (See Comments)    From childhood   Social History   Social History  . Marital status: Single    Spouse name: N/A  . Number of children: N/A  . Years of education: N/A   Occupational History  . Not on file.   Social History Main Topics  . Smoking status: Never Smoker  . Smokeless tobacco: Never Used  . Alcohol use 0.0 oz/week     Comment: once in a blue moon  . Drug use: No  . Sexual activity: Not Currently    Partners: Male   Other Topics Concern  . Not on file   Social History Narrative  . No narrative on file   Family History  Problem Relation Age of Onset  . Heart disease Mother   . Diabetes Sister       Review of Systems  All other systems reviewed and are negative.      Objective:   Physical Exam  Constitutional: She appears well-developed and well-nourished.  Neck: Neck supple. No JVD present. No thyromegaly present.  Cardiovascular: Normal rate, regular rhythm and normal heart sounds.  Exam reveals no gallop and no friction rub.   No murmur heard. Pulmonary/Chest: Effort normal and breath sounds normal. No respiratory distress. She has no wheezes. She has no rales.  Abdominal: Soft. Bowel sounds are normal. She exhibits no distension.  Musculoskeletal:  She exhibits no edema.  Vitals reviewed.         Assessment & Plan:  Renal cell cancer, right ([place]) I recommended the patient proceed with her surgery. Without surgery, the cancer on her right kidney was certainly prove fatal. The question is the mass on the adrenal gland and whether this represents metastasis. There are no this would be to biopsy this mass which can be biopsied simultaneously as the tumor removed from the right kidney. Patient has also been removed from all nephrotoxic medications.  Her renal function is stage IV. She will likely require dialysis afterwards. There are no dosage adjustments necessary regarding trulicity and tradjenta for renal impairment.  If she does require dialysis after surgery, we may need to discontinue these medications and up titrate her insulin  recheck the patient after discharge from the hospital

## 2017-02-25 ENCOUNTER — Other Ambulatory Visit: Payer: Self-pay | Admitting: Family Medicine

## 2017-02-25 DIAGNOSIS — J4540 Moderate persistent asthma, uncomplicated: Secondary | ICD-10-CM

## 2017-02-28 NOTE — Progress Notes (Signed)
Please look at consent order - reason states right renal mass and left adrenal mass but procedure reads right radical nephrectomy then robotic adrenalectomy.   Preop on 8/14.

## 2017-02-28 NOTE — Patient Instructions (Signed)
[name]  02/28/2017   Your procedure is scheduled on: 03/10/17   Report to [facility] Main  Entrance Take [place]  elevators to 3rd floor to  [place] at   [phone] AM.     Call this number if you have problems the morning of surgery [phone]    Remember: ONLY 1 PERSON MAY GO WITH YOU TO SHORT STAY TO GET  READY MORNING OF [place].  Do not eat food or drink liquids :After Midnight.     Take these medicines the morning of surgery with A SIP OF WATER:  DO NOT TAKE ANY DIABETIC MEDICATIONS DAY OF YOUR SURGERY                               You may not have any metal on your body including hair pins and              piercings  Do not wear jewelry, make-up, lotions, powders or perfumes, deodorant             Do not wear nail polish.  Do not shave  48 hours prior to surgery.                Do not bring valuables to the hospital. [place].  Contacts, dentures or bridgework may not be worn into surgery.  Leave suitcase in the car. After surgery it may be brought to your room.                     Please read over the following fact sheets you were given: _____________________________________________________________________             [facility] - Preparing for Surgery Before surgery, you can play an important role.  Because skin is not sterile, your skin needs to be as free of germs as possible.  You can reduce the number of germs on your skin by washing with CHG (chlorahexidine gluconate) soap before surgery.  CHG is an antiseptic cleaner which kills germs and bonds with the skin to continue killing germs even after washing. Please DO NOT use if you have an allergy to CHG or antibacterial soaps.  If your skin becomes reddened/irritated stop using the CHG and inform your nurse when you arrive at Short Stay. Do not shave (including legs and underarms) for at least 48 hours prior to the first CHG  shower.  You may shave your face/neck. Please follow these instructions carefully:  1.  Shower with CHG Soap the night before surgery and the  morning of Surgery.  2.  If you choose to wash your hair, wash your hair first as usual with your  normal  shampoo.  3.  After you shampoo, rinse your hair and body thoroughly to remove the  shampoo.                           4.  Use CHG as you would any other liquid soap.  You can apply chg directly  to the skin and wash                       Gently  with a scrungie or clean washcloth.  5.  Apply the CHG Soap to your body ONLY FROM THE NECK DOWN.   Do not use on face/ open                           Wound or open sores. Avoid contact with eyes, ears mouth and genitals (private parts).                       Wash face,  Genitals (private parts) with your normal soap.             6.  Wash thoroughly, paying special attention to the area where your surgery  will be performed.  7.  Thoroughly rinse your body with warm water from the neck down.  8.  DO NOT shower/wash with your normal soap after using and rinsing off  the CHG Soap.                9.  Pat yourself dry with a clean towel.            10.  Wear clean pajamas.            11.  Place clean sheets on your bed the night of your first shower and do not  sleep with pets. Day of Surgery : Do not apply any lotions/deodorants the morning of surgery.  Please wear clean clothes to the hospital/surgery center.  FAILURE TO FOLLOW THESE INSTRUCTIONS MAY RESULT IN THE CANCELLATION OF YOUR SURGERY PATIENT SIGNATURE_________________________________  NURSE SIGNATURE__________________________________  ________________________________________________________________________  WHAT IS A BLOOD TRANSFUSION? Blood Transfusion Information  A transfusion is the replacement of blood or some of its parts. Blood is made up of multiple cells which provide different functions.  Red blood cells carry oxygen and are used for  blood loss replacement.  White blood cells fight against infection.  Platelets control bleeding.  Plasma helps clot blood.  Other blood products are available for specialized needs, such as hemophilia or other clotting disorders. BEFORE THE TRANSFUSION  Who gives blood for transfusions?   Healthy volunteers who are fully evaluated to make sure their blood is safe. This is blood bank blood. Transfusion therapy is the safest it has ever been in the practice of medicine. Before blood is taken from a donor, a complete history is taken to make sure that person has no history of diseases nor engages in risky social behavior (examples are intravenous drug use or sexual activity with multiple partners). The donor's travel history is screened to minimize risk of transmitting infections, such as malaria. The donated blood is tested for signs of infectious diseases, such as HIV and hepatitis. The blood is then tested to be sure it is compatible with you in order to minimize the chance of a transfusion reaction. If you or a relative donates blood, this is often done in anticipation of surgery and is not appropriate for emergency situations. It takes many days to process the donated blood. RISKS AND COMPLICATIONS Although transfusion therapy is very safe and saves many lives, the main dangers of transfusion include:   Getting an infectious disease.  Developing a transfusion reaction. This is an allergic reaction to something in the blood you were given. Every precaution is taken to prevent this. The decision to have a blood transfusion has been considered carefully by your caregiver before blood is given. Blood is not given unless the benefits outweigh the  risks. AFTER THE TRANSFUSION  Right after receiving a blood transfusion, you will usually feel much better and more energetic. This is especially true if your red blood cells have gotten low (anemic). The transfusion raises the level of the red blood  cells which carry oxygen, and this usually causes an energy increase.  The nurse administering the transfusion will monitor you carefully for complications. HOME CARE INSTRUCTIONS  No special instructions are needed after a transfusion. You may find your energy is better. Speak with your caregiver about any limitations on activity for underlying diseases you may have. SEEK MEDICAL CARE IF:   Your condition is not improving after your transfusion.  You develop redness or irritation at the intravenous (IV) site. SEEK IMMEDIATE MEDICAL CARE IF:  Any of the following symptoms occur over the next 12 hours:  Shaking chills.  You have a temperature by mouth above 102 F (38.9 C), not controlled by medicine.  Chest, back, or muscle pain.  People around you feel you are not acting correctly or are confused.  Shortness of breath or difficulty breathing.  Dizziness and fainting.  You get a rash or develop hives.  You have a decrease in urine output.  Your urine turns a dark color or changes to pink, red, or brown. Any of the following symptoms occur over the next 10 days:  You have a temperature by mouth above 102 F (38.9 C), not controlled by medicine.  Shortness of breath.  Weakness after normal activity.  The white part of the eye turns yellow (jaundice).  You have a decrease in the amount of urine or are urinating less often.  Your urine turns a dark color or changes to pink, red, or brown. Document Released: 07/05/2000 Document Revised: 09/30/2011 Document Reviewed: 02/22/2008 [facility] Patient Information 2014 [place], [place].  _______________________________________________________________________

## 2017-03-04 ENCOUNTER — Encounter (INDEPENDENT_AMBULATORY_CARE_PROVIDER_SITE_OTHER): Payer: Self-pay

## 2017-03-04 ENCOUNTER — Other Ambulatory Visit: Payer: Self-pay | Admitting: Urology

## 2017-03-04 ENCOUNTER — Other Ambulatory Visit: Payer: Self-pay | Admitting: Family Medicine

## 2017-03-04 ENCOUNTER — Encounter (HOSPITAL_COMMUNITY): Payer: Self-pay

## 2017-03-04 ENCOUNTER — Encounter (HOSPITAL_COMMUNITY)
Admission: RE | Admit: 2017-03-04 | Discharge: 2017-03-04 | Disposition: A | Discharge status: Home / Self Care | Payer: Medicare Other | Source: Ambulatory Visit | Attending: Urology | Admitting: Urology

## 2017-03-04 DIAGNOSIS — Z01812 Encounter for preprocedural laboratory examination: Secondary | ICD-10-CM | POA: Insufficient documentation

## 2017-03-04 DIAGNOSIS — Z01810 Encounter for preprocedural cardiovascular examination: Secondary | ICD-10-CM | POA: Insufficient documentation

## 2017-03-04 HISTORY — DX: Pneumonia, unspecified organism: J18.9

## 2017-03-04 HISTORY — DX: Unspecified osteoarthritis, unspecified site: M19.90

## 2017-03-04 HISTORY — DX: Myoneural disorder, unspecified: G70.9

## 2017-03-04 HISTORY — DX: Gastro-esophageal reflux disease without esophagitis: K21.9

## 2017-03-04 LAB — URINALYSIS, COMPLETE (UACMP) WITH MICROSCOPIC
Bilirubin Urine: NEGATIVE
Glucose, UA: NEGATIVE mg/dL
Ketones, ur: NEGATIVE mg/dL
Nitrite: NEGATIVE
Protein, ur: 100 mg/dL — AB
Specific Gravity, Urine: 1.013 (ref 1.005–1.030)
pH: 6 (ref 5.0–8.0)

## 2017-03-04 LAB — CBC
HCT: 34.7 % — ABNORMAL LOW (ref 36.0–46.0)
Hemoglobin: 11.3 g/dL — ABNORMAL LOW (ref 12.0–15.0)
MCH: 29.4 pg (ref 26.0–34.0)
MCHC: 32.6 g/dL (ref 30.0–36.0)
MCV: 90.1 fL (ref 78.0–100.0)
Platelets: 182 10*3/uL (ref 150–400)
RBC: 3.85 MIL/uL — ABNORMAL LOW (ref 3.87–5.11)
RDW: 13.2 % (ref 11.5–15.5)
WBC: 8 10*3/uL (ref 4.0–10.5)

## 2017-03-04 LAB — BASIC METABOLIC PANEL
Anion gap: 10 (ref 5–15)
BUN: 30 mg/dL — ABNORMAL HIGH (ref 6–20)
CO2: 25 mmol/L (ref 22–32)
Calcium: 8.8 mg/dL — ABNORMAL LOW (ref 8.9–10.3)
Chloride: 108 mmol/L (ref 101–111)
Creatinine, Ser: 1.99 mg/dL — ABNORMAL HIGH (ref 0.44–1.00)
GFR calc Af Amer: 29 mL/min — ABNORMAL LOW (ref >60)
GFR calc non Af Amer: 25 mL/min — ABNORMAL LOW (ref >60)
Glucose, Bld: 147 mg/dL — ABNORMAL HIGH (ref 65–99)
Potassium: 4.5 mmol/L (ref 3.5–5.1)
Sodium: 143 mmol/L (ref 135–145)

## 2017-03-04 LAB — ABO/RH: ABO/RH(D): O POS

## 2017-03-04 LAB — GLUCOSE, CAPILLARY: Glucose-Capillary: 154 mg/dL — ABNORMAL HIGH (ref 65–99)

## 2017-03-04 NOTE — Progress Notes (Signed)
Spoke with [employer] at [employer] and she will correct consent form.

## 2017-03-04 NOTE — Progress Notes (Signed)
BMP and U/A done 03/04/17 faxed via epic to dr Pilar Jarvis.

## 2017-03-05 LAB — URINE CULTURE

## 2017-03-05 LAB — HEMOGLOBIN A1C
Hgb A1c MFr Bld: 8.3 % — ABNORMAL HIGH (ref 4.8–5.6)
Mean Plasma Glucose: 192 mg/dL

## 2017-03-05 NOTE — Progress Notes (Signed)
Urinei culture done on 03/04/17 routed via epic to Dr Pilar Jarvis on 03/05/17 at 1420pm.

## 2017-03-05 NOTE — Progress Notes (Signed)
Final EKG done 03/04/17 on chart and in epic and on chart ekg from 2014 which is also in epic.  HGBA1C done 03/04/17 routed via epic to Dr Pilar Jarvis.

## 2017-03-07 ENCOUNTER — Other Ambulatory Visit: Payer: Self-pay | Admitting: Urology

## 2017-03-07 NOTE — Progress Notes (Signed)
Called and LVMM for Coni Mabe at [facility] requesting new consent form order to be placed in EPIC.

## 2017-03-09 NOTE — Anesthesia Preprocedure Evaluation (Addendum)
Anesthesia Evaluation  Patient identified by MRN, date of birth, ID band Patient awake    Reviewed: Allergy & Precautions, NPO status , Patient's Chart, lab work & pertinent test results  History of Anesthesia Complications Negative for: history of anesthetic complications  Airway Mallampati: III  TM Distance: >3 FB Neck ROM: Full    Dental  (+) Missing, Dental Advisory Given   Pulmonary asthma ,    Pulmonary exam normal        Cardiovascular hypertension, Normal cardiovascular exam     Neuro/Psych negative neurological ROS  negative psych ROS   GI/Hepatic negative GI ROS, Neg liver ROS,   Endo/Other  negative endocrine ROSdiabetesMorbid obesity  Renal/GU Renal InsufficiencyRenal disease     Musculoskeletal negative musculoskeletal ROS (+)   Abdominal   Peds  Hematology negative hematology ROS (+)   Anesthesia Other Findings Day of surgery medications reviewed with the patient.  Reproductive/Obstetrics                            Anesthesia Physical Anesthesia Plan  ASA: III  Anesthesia Plan: General   Post-op Pain Management:    Induction: Intravenous  PONV Risk Score and Plan: 4 or greater and Ondansetron, Dexamethasone, Scopolamine patch - Pre-op and Diphenhydramine  Airway Management Planned: Oral ETT  Additional Equipment:   Intra-op Plan:   Post-operative Plan: Extubation in OR  Informed Consent: I have reviewed the patients History and Physical, chart, labs and discussed the procedure including the risks, benefits and alternatives for the proposed anesthesia with the patient or authorized representative who has indicated his/her understanding and acceptance.   Dental advisory given  Plan Discussed with: CRNA, Anesthesiologist and Surgeon  Anesthesia Plan Comments:        Anesthesia Quick Evaluation

## 2017-03-09 NOTE — H&P (Signed)
CC: I have a renal mass.  HPI: [name] is a 66 year-old female established patient who is here for a renal mass.  The problem is on the right side. She has had no symptoms. She had the following x-rays done: Renal Ultrasound and MRI Scan. She has not had kidney surgery.   She has not seen blood in her urine. She does not have a good appetite. She is not having pain in new locations. She has not recently had unwanted weight loss.   The patient has CKD stage III with a creatinine of approximately 2 and a GFR of approximately 30 who was found to have a 6.7 cm right lower pole mass on routine ultrasound performed by her nephrologist due to her chronic kidney disease. Her renal disease as a result of her diabetes. Other complications were diabetes including peripheral neuropathy and toe amputation   She does have a family history of renal failure requiring dialysis in her uncle.   She underwent an MRI of her abdomen without contrast which showed a 9.7 cm renal mass highly suspicious for RCC. It did appear encapsulated and Gerota's fascia did not follow-up right vein. This does not appear amenable to primary nephrectomy due to his size. She also had a large 3.2 cm left adrenal mass that is concerning for a possible left adrenal metastasis.     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   Tobacco Use Assessment Completed: Used Tobacco in last 30 days? Has never  drank.  Drinks 2 caffeinated drinks per day. 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    REVIEW OF SYSTEMS:    GU Review Female:   Patient reports frequent urination, get up at night to urinate, and leakage of urine. Patient denies hard to postpone urination, burning /pain with urination, stream starts and stops, trouble starting your stream, have to strain to urinate, and being pregnant.  Gastrointestinal (Upper):   Patient denies nausea, vomiting, and indigestion/ heartburn.  Gastrointestinal (Lower):   Patient reports diarrhea and constipation.   Constitutional:   Patient reports night sweats and fatigue. Patient denies fever and weight loss.  Skin:   Patient denies skin rash/ lesion and itching.  Eyes:   Patient reports blurred vision. Patient denies double vision.  Ears/ Nose/ Throat:   Patient denies sore throat and sinus problems.  Hematologic/Lymphatic:   Patient denies swollen glands and easy bruising.  Cardiovascular:   Patient reports leg swelling. Patient denies chest pains.  Respiratory:   Patient reports shortness of breath. Patient denies cough.  Endocrine:   Patient denies excessive thirst.  Musculoskeletal:   Patient reports back pain and joint pain.   Neurological:   Patient denies headaches and dizziness.  Psychologic:   Patient denies depression and anxiety.   VITAL SIGNS: None   MULTI-SYSTEM PHYSICAL EXAMINATION:    Constitutional: Well-nourished. No physical deformities. Normally developed. Good grooming.  Neck: Neck symmetrical, not swollen. Normal tracheal position.  Respiratory:  No labored breathing, no use of accessory muscles.   Cardiovascular: Normal temperature, normal extremity pulses, no swelling, no varicosities.  Skin: No paleness, no jaundice, no cyanosis. No lesion, no ulcer, no rash.  Gastrointestinal: No mass, no tenderness, no rigidity, non obese abdomen.  Eyes: Normal conjunctivae. Normal eyelids.  Ears, Nose, Mouth, and Throat: Left ear no scars,  no lesions, no masses. Right ear no scars, no lesions, no masses. Nose no scars, no lesions, no masses. Normal hearing. Normal lips.  Musculoskeletal: Normal gait and station of head and neck.     PAST DATA REVIEWED:  Source Of History:  Patient  X-Ray Review: MRI Abdomen: Reviewed Films. Reviewed Report. Discussed With Patient.     PROCEDURES:         C.T. Chest w/o Contrast - 71250               Urinalysis w/Scope Dipstick Dipstick Cont'd Micro  Color: Yellow Bilirubin: Neg WBC/hpf: 40 - 60/hpf  Appearance: Cloudy Ketones: Neg RBC/hpf: 10 - 20/hpf  Specific Gravity: 1.015 Blood: Trace Bacteria: Many (>50/hpf)  pH: 5.5 Protein: 3+ Cystals: NS (Not Seen)  Glucose: Neg Urobilinogen: 0.2 Casts: Hyaline    Nitrites: Neg Trichomonas: Not Present    Leukocyte Esterase: 1+ Mucous: Not Present      Epithelial Cells: NS (Not Seen)      Yeast: NS (Not Seen)      Sperm: Not Present    ASSESSMENT:      ICD-10 Details  1 GU:   Right renal neoplasm - D49.511 Stable  2   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 Chest Without Contrast  Return Visit/Planned Activity: Next Available Appointment - C.T. Chest  Procedure: Unspecified Date - Laparoscopy Adrenalectomy - [phone], left  Procedure: Unspecified Date - Radical nephrectomy (laparoscopic) - 44315, right          Document Letter(s):  Created for [employer]. Dennard Schaumann, MD   Created for Patient: Clinical Summary   Created for Sherril Croon, MD    The patient and I talked at length about treatment options of kidney cancer. Etiologies of kidney cancer were discussed with the patient.   Treatment options were discussed with the patient including radical nephrectomy, partial nephrectomy, radical nephroureterectomy, and ablative procedures. The possible need for postoperative chemotherapy, radiation therapy, and immunotherapy were discussed. The pathological  grades of kidney cancer, stages of kidney cancer including possible local nodal spread, local extension, renal vein involvement, and IVC involvement were discussed as well. The risks, benefits, and some of the possible complications of all of the above treatment options were discussed with the patient as well.   We discussed the possible future recurrence of kidney cancer. Alternative treatment options were discussed with the patient in detail. All questions were answered.   The patient gave informed consent to proceed with a laparoscopic radical nephrectomy for treatment of the clinically localized kidney cancer. They understands that the surgery may need to be done through and open incision if the laparoscopic approach is not possible.        Notes:  Plan for right robotic nephrectomy/left robotic adrenalectomy.

## 2017-03-10 ENCOUNTER — Encounter (HOSPITAL_COMMUNITY)
Admission: RE | Disposition: A | Discharge status: Skilled Nursing Facility | Payer: Self-pay | Source: Ambulatory Visit | Attending: Urology

## 2017-03-10 ENCOUNTER — Encounter (HOSPITAL_COMMUNITY): Payer: Self-pay | Admitting: Anesthesiology

## 2017-03-10 ENCOUNTER — Inpatient Hospital Stay (HOSPITAL_COMMUNITY): Payer: Medicare Other | Admitting: Anesthesiology

## 2017-03-10 ENCOUNTER — Inpatient Hospital Stay (HOSPITAL_COMMUNITY)
Admission: RE | Admit: 2017-03-10 | Discharge: 2017-03-14 | DRG: 657 | Disposition: A | Discharge status: Skilled Nursing Facility | Payer: Medicare Other | Source: Ambulatory Visit | Attending: Urology | Admitting: Urology

## 2017-03-10 DIAGNOSIS — E1122 Type 2 diabetes mellitus with diabetic chronic kidney disease: Secondary | ICD-10-CM | POA: Diagnosis present

## 2017-03-10 DIAGNOSIS — M6281 Muscle weakness (generalized): Secondary | ICD-10-CM | POA: Diagnosis not present

## 2017-03-10 DIAGNOSIS — C641 Malignant neoplasm of right kidney, except renal pelvis: Secondary | ICD-10-CM | POA: Diagnosis not present

## 2017-03-10 DIAGNOSIS — E119 Type 2 diabetes mellitus without complications: Secondary | ICD-10-CM | POA: Diagnosis not present

## 2017-03-10 DIAGNOSIS — C7972 Secondary malignant neoplasm of left adrenal gland: Secondary | ICD-10-CM | POA: Diagnosis not present

## 2017-03-10 DIAGNOSIS — Z7984 Long term (current) use of oral hypoglycemic drugs: Secondary | ICD-10-CM | POA: Diagnosis not present

## 2017-03-10 DIAGNOSIS — Z880 Allergy status to penicillin: Secondary | ICD-10-CM

## 2017-03-10 DIAGNOSIS — E1340 Other specified diabetes mellitus with diabetic neuropathy, unspecified: Secondary | ICD-10-CM | POA: Diagnosis not present

## 2017-03-10 DIAGNOSIS — Z89429 Acquired absence of other toe(s), unspecified side: Secondary | ICD-10-CM | POA: Diagnosis not present

## 2017-03-10 DIAGNOSIS — I129 Hypertensive chronic kidney disease with stage 1 through stage 4 chronic kidney disease, or unspecified chronic kidney disease: Secondary | ICD-10-CM | POA: Diagnosis present

## 2017-03-10 DIAGNOSIS — R262 Difficulty in walking, not elsewhere classified: Secondary | ICD-10-CM | POA: Diagnosis not present

## 2017-03-10 DIAGNOSIS — Z6841 Body Mass Index (BMI) 40.0 and over, adult: Secondary | ICD-10-CM | POA: Diagnosis not present

## 2017-03-10 DIAGNOSIS — Z8249 Family history of ischemic heart disease and other diseases of the circulatory system: Secondary | ICD-10-CM

## 2017-03-10 DIAGNOSIS — J45909 Unspecified asthma, uncomplicated: Secondary | ICD-10-CM | POA: Diagnosis present

## 2017-03-10 DIAGNOSIS — Z842 Family history of other diseases of the genitourinary system: Secondary | ICD-10-CM

## 2017-03-10 DIAGNOSIS — E7800 Pure hypercholesterolemia, unspecified: Secondary | ICD-10-CM | POA: Diagnosis not present

## 2017-03-10 DIAGNOSIS — N183 Chronic kidney disease, stage 3 (moderate): Secondary | ICD-10-CM | POA: Diagnosis present

## 2017-03-10 DIAGNOSIS — C7490 Malignant neoplasm of unspecified part of unspecified adrenal gland: Secondary | ICD-10-CM | POA: Diagnosis not present

## 2017-03-10 DIAGNOSIS — E278 Other specified disorders of adrenal gland: Secondary | ICD-10-CM | POA: Diagnosis not present

## 2017-03-10 DIAGNOSIS — N2889 Other specified disorders of kidney and ureter: Secondary | ICD-10-CM

## 2017-03-10 DIAGNOSIS — E785 Hyperlipidemia, unspecified: Secondary | ICD-10-CM | POA: Diagnosis not present

## 2017-03-10 DIAGNOSIS — E1142 Type 2 diabetes mellitus with diabetic polyneuropathy: Secondary | ICD-10-CM | POA: Diagnosis present

## 2017-03-10 DIAGNOSIS — I10 Essential (primary) hypertension: Secondary | ICD-10-CM | POA: Diagnosis not present

## 2017-03-10 DIAGNOSIS — Z8631 Personal history of diabetic foot ulcer: Secondary | ICD-10-CM | POA: Diagnosis not present

## 2017-03-10 DIAGNOSIS — C649 Malignant neoplasm of unspecified kidney, except renal pelvis: Secondary | ICD-10-CM | POA: Diagnosis not present

## 2017-03-10 HISTORY — PX: ROBOTIC ASSITED PARTIAL NEPHRECTOMY: SHX6087

## 2017-03-10 HISTORY — PX: ROBOTIC ADRENALECTOMY: SHX6407

## 2017-03-10 HISTORY — DX: Other specified disorders of kidney and ureter: N28.89

## 2017-03-10 LAB — TYPE AND SCREEN
ABO/RH(D): O POS
Antibody Screen: NEGATIVE

## 2017-03-10 LAB — GLUCOSE, CAPILLARY
Glucose-Capillary: 155 mg/dL — ABNORMAL HIGH (ref 65–99)
Glucose-Capillary: 157 mg/dL — ABNORMAL HIGH (ref 65–99)
Glucose-Capillary: 267 mg/dL — ABNORMAL HIGH (ref 65–99)
Glucose-Capillary: 267 mg/dL — ABNORMAL HIGH (ref 65–99)
Glucose-Capillary: 283 mg/dL — ABNORMAL HIGH (ref 65–99)
Glucose-Capillary: 286 mg/dL — ABNORMAL HIGH (ref 65–99)
Glucose-Capillary: 297 mg/dL — ABNORMAL HIGH (ref 65–99)
Glucose-Capillary: 318 mg/dL — ABNORMAL HIGH (ref 65–99)

## 2017-03-10 SURGERY — ADRENALECTOMY, ROBOT-ASSISTED
Anesthesia: General | Laterality: Right

## 2017-03-10 MED ORDER — BUPIVACAINE-EPINEPHRINE (PF) 0.25% -1:200000 IJ SOLN
INTRAMUSCULAR | Status: AC
  Filled 2017-03-10: qty 30

## 2017-03-10 MED ORDER — FENTANYL CITRATE (PF) 100 MCG/2ML IJ SOLN
INTRAMUSCULAR | Status: DC | PRN
  Administered 2017-03-10 (×3): 50 ug via INTRAVENOUS
  Administered 2017-03-10: 100 ug via INTRAVENOUS

## 2017-03-10 MED ORDER — SODIUM CHLORIDE 0.9 % IJ SOLN
INTRAMUSCULAR | Status: AC
  Filled 2017-03-10: qty 50

## 2017-03-10 MED ORDER — SODIUM CHLORIDE 0.9 % IV SOLN
INTRAVENOUS | Status: DC
  Administered 2017-03-11 – 2017-03-12 (×3): via INTRAVENOUS

## 2017-03-10 MED ORDER — EPHEDRINE SULFATE-NACL 50-0.9 MG/10ML-% IV SOSY
PREFILLED_SYRINGE | INTRAVENOUS | Status: DC | PRN
  Administered 2017-03-10 (×2): 5 mg via INTRAVENOUS

## 2017-03-10 MED ORDER — PROPOFOL 10 MG/ML IV BOLUS
INTRAVENOUS | Status: AC
  Filled 2017-03-10: qty 40

## 2017-03-10 MED ORDER — LOSARTAN POTASSIUM 50 MG PO TABS
100.0000 mg | ORAL_TABLET | Freq: Every day | ORAL | Status: DC
  Administered 2017-03-10: 100 mg via ORAL
  Filled 2017-03-10 (×2): qty 2

## 2017-03-10 MED ORDER — LACTATED RINGERS IV SOLN: INTRAVENOUS | Status: DC | PRN

## 2017-03-10 MED ORDER — ALBUMIN HUMAN 5 % IV SOLN
INTRAVENOUS | Status: DC | PRN
  Administered 2017-03-10: 11:00:00 via INTRAVENOUS

## 2017-03-10 MED ORDER — CLINDAMYCIN PHOSPHATE 900 MG/50ML IV SOLN
900.0000 mg | INTRAVENOUS | Status: AC
  Administered 2017-03-10: 900 mg via INTRAVENOUS

## 2017-03-10 MED ORDER — CLINDAMYCIN PHOSPHATE 900 MG/50ML IV SOLN
900.0000 mg | Freq: Four times a day (QID) | INTRAVENOUS | Status: DC
  Administered 2017-03-10: 900 mg via INTRAVENOUS
  Filled 2017-03-10 (×2): qty 50

## 2017-03-10 MED ORDER — SUCCINYLCHOLINE CHLORIDE 200 MG/10ML IV SOSY
PREFILLED_SYRINGE | INTRAVENOUS | Status: AC
Start: 2017-03-10 — End: 2017-03-10
  Filled 2017-03-10: qty 10

## 2017-03-10 MED ORDER — HEPARIN SODIUM (PORCINE) 5000 UNIT/ML IJ SOLN
5000.0000 [IU] | Freq: Three times a day (TID) | INTRAMUSCULAR | Status: DC
  Administered 2017-03-10 – 2017-03-14 (×11): 5000 [IU] via SUBCUTANEOUS
  Filled 2017-03-10 (×12): qty 1

## 2017-03-10 MED ORDER — FENTANYL CITRATE (PF) 250 MCG/5ML IJ SOLN
INTRAMUSCULAR | Status: AC
  Filled 2017-03-10: qty 5

## 2017-03-10 MED ORDER — ALBUMIN HUMAN 5 % IV SOLN
INTRAVENOUS | Status: AC
  Filled 2017-03-10: qty 500

## 2017-03-10 MED ORDER — INSULIN ASPART 100 UNIT/ML ~~LOC~~ SOLN
12.0000 [IU] | Freq: Once | SUBCUTANEOUS | Status: AC
  Administered 2017-03-10: 12 [IU] via SUBCUTANEOUS

## 2017-03-10 MED ORDER — ONDANSETRON HCL 4 MG/2ML IJ SOLN
INTRAMUSCULAR | Status: AC
  Filled 2017-03-10: qty 2

## 2017-03-10 MED ORDER — SODIUM CHLORIDE 0.9 % IV SOLN
INTRAVENOUS | Status: DC | PRN
  Administered 2017-03-10: 07:00:00 via INTRAVENOUS

## 2017-03-10 MED ORDER — LIDOCAINE 2% (20 MG/ML) 5 ML SYRINGE
INTRAMUSCULAR | Status: AC
  Filled 2017-03-10: qty 5

## 2017-03-10 MED ORDER — MIDAZOLAM HCL 2 MG/2ML IJ SOLN
INTRAMUSCULAR | Status: AC
  Filled 2017-03-10: qty 2

## 2017-03-10 MED ORDER — PROPOFOL 10 MG/ML IV BOLUS
INTRAVENOUS | Status: DC | PRN
  Administered 2017-03-10: 170 mg via INTRAVENOUS

## 2017-03-10 MED ORDER — EPHEDRINE 5 MG/ML INJ
INTRAVENOUS | Status: AC
  Filled 2017-03-10: qty 10

## 2017-03-10 MED ORDER — SUGAMMADEX SODIUM 500 MG/5ML IV SOLN
INTRAVENOUS | Status: AC
  Filled 2017-03-10: qty 5

## 2017-03-10 MED ORDER — ROCURONIUM BROMIDE 50 MG/5ML IV SOSY
PREFILLED_SYRINGE | INTRAVENOUS | Status: AC
  Filled 2017-03-10: qty 5

## 2017-03-10 MED ORDER — MORPHINE SULFATE (PF) 2 MG/ML IV SOLN
2.0000 mg | INTRAVENOUS | Status: DC | PRN
  Administered 2017-03-10: 4 mg via INTRAVENOUS
  Filled 2017-03-10: qty 2

## 2017-03-10 MED ORDER — PRAVASTATIN SODIUM 40 MG PO TABS
40.0000 mg | ORAL_TABLET | Freq: Every evening | ORAL | Status: DC
  Administered 2017-03-10 – 2017-03-13 (×4): 40 mg via ORAL
  Filled 2017-03-10 (×4): qty 1

## 2017-03-10 MED ORDER — FLUTICASONE FUROATE-VILANTEROL 100-25 MCG/INH IN AEPB
1.0000 | INHALATION_SPRAY | Freq: Every day | RESPIRATORY_TRACT | Status: DC
  Administered 2017-03-11 – 2017-03-14 (×4): 1 via RESPIRATORY_TRACT
  Filled 2017-03-10: qty 28

## 2017-03-10 MED ORDER — ROCURONIUM BROMIDE 50 MG/5ML IV SOSY
PREFILLED_SYRINGE | INTRAVENOUS | Status: AC
  Filled 2017-03-10: qty 10

## 2017-03-10 MED ORDER — HYDROMORPHONE HCL-NACL 0.5-0.9 MG/ML-% IV SOSY: 0.2500 mg | PREFILLED_SYRINGE | INTRAVENOUS | Status: DC | PRN

## 2017-03-10 MED ORDER — ALBUTEROL SULFATE (2.5 MG/3ML) 0.083% IN NEBU: 2.5000 mg | INHALATION_SOLUTION | Freq: Four times a day (QID) | RESPIRATORY_TRACT | Status: DC | PRN

## 2017-03-10 MED ORDER — BUPIVACAINE LIPOSOME 1.3 % IJ SUSP
20.0000 mL | Freq: Once | INTRAMUSCULAR | Status: AC
Start: 2017-03-10 — End: 2017-03-10
  Administered 2017-03-10: 20 mL
  Filled 2017-03-10: qty 20

## 2017-03-10 MED ORDER — ORAL CARE MOUTH RINSE
15.0000 mL | Freq: Two times a day (BID) | OROMUCOSAL | Status: DC
  Administered 2017-03-11 – 2017-03-13 (×2): 15 mL via OROMUCOSAL

## 2017-03-10 MED ORDER — LIDOCAINE 2% (20 MG/ML) 5 ML SYRINGE
INTRAMUSCULAR | Status: DC | PRN
  Administered 2017-03-10: 80 mg via INTRAVENOUS

## 2017-03-10 MED ORDER — ROCURONIUM BROMIDE 10 MG/ML (PF) SYRINGE
PREFILLED_SYRINGE | INTRAVENOUS | Status: DC | PRN
  Administered 2017-03-10: 20 mg via INTRAVENOUS
  Administered 2017-03-10: 15 mg via INTRAVENOUS
  Administered 2017-03-10 (×2): 20 mg via INTRAVENOUS
  Administered 2017-03-10: 30 mg via INTRAVENOUS
  Administered 2017-03-10: 20 mg via INTRAVENOUS
  Administered 2017-03-10: 50 mg via INTRAVENOUS
  Administered 2017-03-10: 10 mg via INTRAVENOUS

## 2017-03-10 MED ORDER — MIDAZOLAM HCL 5 MG/5ML IJ SOLN
INTRAMUSCULAR | Status: DC | PRN
  Administered 2017-03-10: 1 mg via INTRAVENOUS

## 2017-03-10 MED ORDER — HYDROMORPHONE HCL-NACL 0.5-0.9 MG/ML-% IV SOSY
PREFILLED_SYRINGE | INTRAVENOUS | Status: AC
  Filled 2017-03-10: qty 3

## 2017-03-10 MED ORDER — BUPIVACAINE HCL (PF) 0.25 % IJ SOLN
INTRAMUSCULAR | Status: AC
  Filled 2017-03-10: qty 30

## 2017-03-10 MED ORDER — INSULIN ASPART 100 UNIT/ML ~~LOC~~ SOLN
0.0000 [IU] | SUBCUTANEOUS | Status: DC
  Administered 2017-03-10: 2 [IU] via SUBCUTANEOUS
  Administered 2017-03-10: 12 [IU] via SUBCUTANEOUS
  Administered 2017-03-10: 2 [IU] via SUBCUTANEOUS
  Administered 2017-03-11: 4 [IU] via SUBCUTANEOUS
  Administered 2017-03-11: 2 [IU] via SUBCUTANEOUS
  Administered 2017-03-11: 8 [IU] via SUBCUTANEOUS
  Administered 2017-03-11 – 2017-03-12 (×3): 2 [IU] via SUBCUTANEOUS
  Administered 2017-03-12: 4 [IU] via SUBCUTANEOUS
  Administered 2017-03-12: 2 [IU] via SUBCUTANEOUS
  Administered 2017-03-12: 8 [IU] via SUBCUTANEOUS
  Administered 2017-03-12: 4 [IU] via SUBCUTANEOUS
  Administered 2017-03-13 (×3): 2 [IU] via SUBCUTANEOUS
  Administered 2017-03-13 – 2017-03-14 (×2): 4 [IU] via SUBCUTANEOUS
  Administered 2017-03-14 (×2): 2 [IU] via SUBCUTANEOUS
  Administered 2017-03-14: 4 [IU] via SUBCUTANEOUS

## 2017-03-10 MED ORDER — LACTATED RINGERS IR SOLN
Status: DC | PRN
  Administered 2017-03-10: 1

## 2017-03-10 MED ORDER — AMLODIPINE BESYLATE 10 MG PO TABS
10.0000 mg | ORAL_TABLET | Freq: Every day | ORAL | Status: DC
  Administered 2017-03-10 – 2017-03-14 (×5): 10 mg via ORAL
  Filled 2017-03-10: qty 1
  Filled 2017-03-10: qty 2
  Filled 2017-03-10 (×3): qty 1

## 2017-03-10 MED ORDER — ALBUTEROL SULFATE HFA 108 (90 BASE) MCG/ACT IN AERS: 2.0000 | INHALATION_SPRAY | Freq: Four times a day (QID) | RESPIRATORY_TRACT | Status: DC | PRN

## 2017-03-10 MED ORDER — ONDANSETRON HCL 4 MG/2ML IJ SOLN
4.0000 mg | INTRAMUSCULAR | Status: DC | PRN
Start: 2017-03-10 — End: 2017-03-14

## 2017-03-10 MED ORDER — SUGAMMADEX SODIUM 500 MG/5ML IV SOLN
INTRAVENOUS | Status: DC | PRN
  Administered 2017-03-10: 400 mg via INTRAVENOUS

## 2017-03-10 MED ORDER — HYDROCODONE-ACETAMINOPHEN 5-325 MG PO TABS
1.0000 | ORAL_TABLET | ORAL | Status: DC | PRN
  Administered 2017-03-11 – 2017-03-14 (×5): 2 via ORAL
  Filled 2017-03-10 (×5): qty 2

## 2017-03-10 MED ORDER — CLINDAMYCIN PHOSPHATE 900 MG/50ML IV SOLN
INTRAVENOUS | Status: AC
  Filled 2017-03-10: qty 50

## 2017-03-10 MED ORDER — CLINDAMYCIN PHOSPHATE 900 MG/50ML IV SOLN
900.0000 mg | Freq: Four times a day (QID) | INTRAVENOUS | Status: AC
  Administered 2017-03-10 – 2017-03-11 (×2): 900 mg via INTRAVENOUS
  Filled 2017-03-10: qty 50

## 2017-03-10 MED ORDER — STERILE WATER FOR IRRIGATION IR SOLN
Status: DC | PRN
  Administered 2017-03-10: 1000 mL

## 2017-03-10 MED ORDER — ACETAMINOPHEN 10 MG/ML IV SOLN
INTRAVENOUS | Status: DC | PRN
  Administered 2017-03-10: 1000 mg via INTRAVENOUS

## 2017-03-10 MED ORDER — SUGAMMADEX SODIUM 200 MG/2ML IV SOLN
INTRAVENOUS | Status: AC
  Filled 2017-03-10: qty 4

## 2017-03-10 MED ORDER — DEXAMETHASONE SODIUM PHOSPHATE 10 MG/ML IJ SOLN
INTRAMUSCULAR | Status: AC
  Filled 2017-03-10: qty 1

## 2017-03-10 MED ORDER — ACETAMINOPHEN 10 MG/ML IV SOLN
INTRAVENOUS | Status: AC
  Filled 2017-03-10: qty 100

## 2017-03-10 MED ORDER — SCOPOLAMINE 1 MG/3DAYS TD PT72
MEDICATED_PATCH | TRANSDERMAL | Status: AC
  Filled 2017-03-10: qty 1

## 2017-03-10 MED ORDER — SCOPOLAMINE 1 MG/3DAYS TD PT72
1.0000 | MEDICATED_PATCH | TRANSDERMAL | Status: DC
  Administered 2017-03-10: 1 via TRANSDERMAL

## 2017-03-10 MED ORDER — DEXAMETHASONE SODIUM PHOSPHATE 10 MG/ML IJ SOLN
INTRAMUSCULAR | Status: DC | PRN
  Administered 2017-03-10: 4 mg via INTRAVENOUS

## 2017-03-10 MED ORDER — ONDANSETRON HCL 4 MG/2ML IJ SOLN
INTRAMUSCULAR | Status: DC | PRN
  Administered 2017-03-10: 4 mg via INTRAVENOUS

## 2017-03-10 MED ORDER — PROMETHAZINE HCL 25 MG/ML IJ SOLN: 6.2500 mg | INTRAMUSCULAR | Status: DC | PRN

## 2017-03-10 MED ORDER — DOCUSATE SODIUM 100 MG PO CAPS
100.0000 mg | ORAL_CAPSULE | Freq: Two times a day (BID) | ORAL | Status: DC
  Administered 2017-03-10 – 2017-03-14 (×8): 100 mg via ORAL
  Filled 2017-03-10 (×8): qty 1

## 2017-03-10 MED ORDER — GABAPENTIN 300 MG PO CAPS
300.0000 mg | ORAL_CAPSULE | Freq: Every day | ORAL | Status: DC
  Administered 2017-03-10 – 2017-03-13 (×4): 300 mg via ORAL
  Filled 2017-03-10 (×4): qty 1

## 2017-03-10 MED ORDER — SUCCINYLCHOLINE CHLORIDE 200 MG/10ML IV SOSY
PREFILLED_SYRINGE | INTRAVENOUS | Status: DC | PRN
  Administered 2017-03-10: 140 mg via INTRAVENOUS

## 2017-03-10 MED ORDER — SODIUM CHLORIDE 0.9 % IV SOLN
INTRAVENOUS | Status: DC | PRN
  Administered 2017-03-10 (×2): via INTRAVENOUS

## 2017-03-10 SURGICAL SUPPLY — 76 items
ADH SKN CLS APL DERMABOND .7 (GAUZE/BANDAGES/DRESSINGS) ×2
AGENT HMST KT MTR STRL THRMB (HEMOSTASIS) ×4
AGENT HMST MTR 8 SURGIFLO (HEMOSTASIS)
APL ESCP 34 STRL LF DISP (HEMOSTASIS) ×2
APPLICATOR SURGIFLO ENDO (HEMOSTASIS) ×3 IMPLANT
BAG LAPAROSCOPIC 12 15 PORT 16 (BASKET) IMPLANT
BAG RETRIEVAL 12/15 (BASKET) ×3
BAG SPEC RTRVL LRG 6X4 10 (ENDOMECHANICALS) ×2
CHLORAPREP W/TINT 26ML (MISCELLANEOUS) ×3 IMPLANT
CLIP LIGATING HEMO LOK XL GOLD (MISCELLANEOUS) ×2 IMPLANT
CLIP VESOLOCK LG 6/CT PURPLE (CLIP) ×3 IMPLANT
CLIP VESOLOCK MED LG 6/CT (CLIP) ×6 IMPLANT
CLIP VESOLOCK XL 6/CT (CLIP) IMPLANT
COVER SURGICAL LIGHT HANDLE (MISCELLANEOUS) ×3 IMPLANT
COVER TIP SHEARS 8 DVNC (MISCELLANEOUS) ×2 IMPLANT
COVER TIP SHEARS 8MM DA VINCI (MISCELLANEOUS) ×2
DECANTER SPIKE VIAL GLASS SM (MISCELLANEOUS) ×3 IMPLANT
DERMABOND ADVANCED (GAUZE/BANDAGES/DRESSINGS) ×1
DERMABOND ADVANCED .7 DNX12 (GAUZE/BANDAGES/DRESSINGS) ×2 IMPLANT
DRAIN CHANNEL 15F RND FF 3/16 (WOUND CARE) ×3 IMPLANT
DRAPE ARM DVNC X/XI (DISPOSABLE) ×8 IMPLANT
DRAPE COLUMN DVNC XI (DISPOSABLE) ×2 IMPLANT
DRAPE DA VINCI XI ARM (DISPOSABLE) ×4
DRAPE DA VINCI XI COLUMN (DISPOSABLE) ×1
DRAPE INCISE IOBAN 66X45 STRL (DRAPES) ×3 IMPLANT
DRAPE LAPAROSCOPIC ABDOMINAL (DRAPES) ×3 IMPLANT
DRAPE SHEET LG 3/4 BI-LAMINATE (DRAPES) ×4 IMPLANT
DRAPE UTILITY XL STRL (DRAPES) ×1 IMPLANT
ELECT PENCIL ROCKER SW 15FT (MISCELLANEOUS) ×3 IMPLANT
ELECT REM PT RETURN 15FT ADLT (MISCELLANEOUS) ×6 IMPLANT
EVACUATOR SILICONE 100CC (DRAIN) ×3 IMPLANT
GLOVE BIO SURGEON STRL SZ 6.5 (GLOVE) ×3 IMPLANT
GLOVE BIOGEL M 7.0 STRL (GLOVE) ×6 IMPLANT
GLOVE BIOGEL PI IND STRL 7.5 (GLOVE) ×2 IMPLANT
GLOVE BIOGEL PI INDICATOR 7.5 (GLOVE) ×1
GOWN STRL REUS W/TWL LRG LVL3 (GOWN DISPOSABLE) ×9 IMPLANT
IRRIG SUCT STRYKERFLOW 2 WTIP (MISCELLANEOUS)
IRRIGATION SUCT STRKRFLW 2 WTP (MISCELLANEOUS) IMPLANT
KIT BASIN OR (CUSTOM PROCEDURE TRAY) ×3 IMPLANT
LOOP VESSEL MAXI BLUE (MISCELLANEOUS) IMPLANT
MARKER SKIN DUAL TIP RULER LAB (MISCELLANEOUS) ×3 IMPLANT
NDL INSUFFLATION 14GA 120MM (NEEDLE) IMPLANT
NEEDLE INSUFFLATION 14GA 120MM (NEEDLE) IMPLANT
NS IRRIG 1000ML POUR BTL (IV SOLUTION) ×3 IMPLANT
PORT ACCESS TROCAR AIRSEAL 12 (TROCAR) IMPLANT
PORT ACCESS TROCAR AIRSEAL 5M (TROCAR)
POSITIONER SURGICAL ARM (MISCELLANEOUS) ×6 IMPLANT
POUCH SPECIMEN RETRIEVAL 10MM (ENDOMECHANICALS) ×3 IMPLANT
RELOAD STAPLE 60 2.6 WHT THN (STAPLE) IMPLANT
RELOAD STAPLER WHITE 60MM (STAPLE) ×14 IMPLANT
SEAL CANN UNIV 5-8 DVNC XI (MISCELLANEOUS) ×8 IMPLANT
SEAL XI 5MM-8MM UNIVERSAL (MISCELLANEOUS) ×4
SET TRI-LUMEN FLTR TB AIRSEAL (TUBING) IMPLANT
SOLUTION ELECTROLUBE (MISCELLANEOUS) ×3 IMPLANT
SPOGE SURGIFLO 8M (HEMOSTASIS)
SPONGE LAP 4X18 X RAY DECT (DISPOSABLE) ×1 IMPLANT
SPONGE SURGIFLO 8M (HEMOSTASIS) IMPLANT
STAPLER ECHELON LONG 60 440 (INSTRUMENTS) ×1 IMPLANT
STAPLER RELOAD WHITE 60MM (STAPLE) ×21
SURGIFLO W/THROMBIN 8M KIT (HEMOSTASIS) ×4 IMPLANT
SUT ETHILON 3 0 PS 1 (SUTURE) ×3 IMPLANT
SUT MNCRL AB 4-0 PS2 18 (SUTURE) ×8 IMPLANT
SUT PDS AB 0 CT1 36 (SUTURE) ×3 IMPLANT
SUT V-LOC BARB 180 2/0GR6 GS22 (SUTURE) ×3
SUT VICRYL 0 UR6 27IN ABS (SUTURE) ×1 IMPLANT
SUT VLOC BARB 180 ABS3/0GR12 (SUTURE)
SUTURE V-LC BRB 180 2/0GR6GS22 (SUTURE) ×2 IMPLANT
SUTURE VLOC BRB 180 ABS3/0GR12 (SUTURE) IMPLANT
TAPE STRIPS DRAPE STRL (GAUZE/BANDAGES/DRESSINGS) IMPLANT
TOWEL OR 17X26 10 PK STRL BLUE (TOWEL DISPOSABLE) ×6 IMPLANT
TOWEL OR NON WOVEN STRL DISP B (DISPOSABLE) ×3 IMPLANT
TRAY FOLEY W/METER SILVER 16FR (SET/KITS/TRAYS/PACK) ×3 IMPLANT
TRAY LAPAROSCOPIC (CUSTOM PROCEDURE TRAY) ×3 IMPLANT
TROCAR BLADELESS OPT 5 100 (ENDOMECHANICALS) IMPLANT
TROCAR XCEL 12X100 BLDLESS (ENDOMECHANICALS) ×1 IMPLANT
WATER STERILE IRR 1000ML POUR (IV SOLUTION) ×3 IMPLANT

## 2017-03-10 NOTE — Interval H&P Note (Signed)
History and Physical Interval Note:  03/10/2017 7:22 AM  [name]  has presented today for surgery, with the diagnosis of RIGHT RENAL MASS, LEFT ADRENAL MASS  The various methods of treatment have been discussed with the patient and family. After consideration of risks, benefits and other options for treatment, the patient has consented to  Procedure(s): XI ROBOTIC ADRENALECTOMY (Left) XI ROBOTIC ASSITED RADICAL NEPHRECTOMY (Right) as a surgical intervention .  The patient's history has been reviewed, patient examined, no change in status, stable for surgery.  I have reviewed the patient's chart and labs.  Questions were answered to the patient's satisfaction.     Nickie Retort

## 2017-03-10 NOTE — Anesthesia Postprocedure Evaluation (Signed)
Anesthesia Post Note  Patient: [name]  Procedure(s) Performed: Procedure(s) (LRB): XI ROBOTIC ADRENALECTOMY (Left) XI ROBOTIC ASSITED RADICAL NEPHRECTOMY (Right)     Patient location during evaluation: PACU Anesthesia Type: General Level of consciousness: sedated Pain management: pain level controlled Vital Signs Assessment: post-procedure vital signs reviewed and stable Respiratory status: spontaneous breathing and respiratory function stable Cardiovascular status: stable Anesthetic complications: no    Last Vitals:  Vitals:   03/10/17 1400 03/10/17 1430  BP: 134/71 140/70  Pulse: 75 75  Resp: 18 18  Temp: 36.4 C 36.7 C  SpO2: 98% 100%    Last Pain:  Vitals:   03/10/17 1436  TempSrc:   PainSc: Asleep                 Haeleigh Streiff DANIEL

## 2017-03-10 NOTE — Transfer of Care (Signed)
Immediate Anesthesia Transfer of Care Note  Patient: [name]  Procedure(s) Performed: Procedure(s): XI ROBOTIC ADRENALECTOMY (Left) XI ROBOTIC ASSITED RADICAL NEPHRECTOMY (Right)  Patient Location: PACU  Anesthesia Type:General  Level of Consciousness:  sedated, patient cooperative and responds to stimulation  Airway & Oxygen Therapy:Patient Spontanous Breathing and Patient connected to face mask oxgen  Post-op Assessment:  Report given to PACU RN and Post -op Vital signs reviewed and stable  Post vital signs:  Reviewed and stable  Last Vitals:  Vitals:   03/10/17 0541  BP: (!) 179/84  Pulse: 93  Resp: 18  Temp: 36.9 C  SpO2: 68%    Complications: No apparent anesthesia complications

## 2017-03-10 NOTE — Anesthesia Procedure Notes (Signed)
Procedure Name: Intubation Date/Time: 03/10/2017 7:54 AM Performed by: Darleth Eustache, Virgel Gess Pre-anesthesia Checklist: Patient identified, Emergency Drugs available, Suction available, Patient being monitored and Timeout performed Patient Re-evaluated:Patient Re-evaluated prior to induction Oxygen Delivery Method: Circle system utilized Preoxygenation: Pre-oxygenation with 100% oxygen Induction Type: IV induction Ventilation: Mask ventilation without difficulty Laryngoscope Size: Mac and 4 Grade View: Grade I Tube type: Subglottic suction tube Tube size: 7.5 mm Number of attempts: 1 Airway Equipment and Method: Stylet Placement Confirmation: ETT inserted through vocal cords under direct vision,  positive ETCO2,  CO2 detector and breath sounds checked- equal and bilateral Secured at: 21 cm Tube secured with: Tape Dental Injury: Teeth and Oropharynx as per pre-operative assessment

## 2017-03-10 NOTE — Op Note (Signed)
Date of procedure: 03/10/17  Preoperative diagnosis:  1. Right renal mass 2. Left adrenal mass   Postoperative diagnosis:  1. same   Procedure: 1. Right robotic radical nephrectomy with sparing of right adrenal gland 2. Left robotic adrenalectomy  Surgeon: [name], MD  Assistant: Alexis Frock, MD  Anesthesia: General  Complications: None  Intraoperative findings: The patient underwent an unremarkable right radical nephrectomy with sparing of the right adrenal gland and an uneventful left robotic nephrectomy.  EBL: 200 cc  Specimens: Right kidney, left adrenal gland sent separately to pathology  Drains:  14 French Foley catheter  Disposition: Stable to the postanesthesia care unit  Indication for procedure: The patient is a 66 y.o. female with history of chronic kidney disease and a 10 cm right renal mass andfor similar left adrenal mass concerning for an isolated metastasis presents today for robotic right nephrectomy with left adrenalectomy. She is aware that this procedure may result in her requiring dialysis due to her baseline GFR ranging from 25-30.  After reviewing the management options for treatment, the patient elected to proceed with the above surgical procedure(s). We have discussed the potential benefits and risks of the procedure, side effects of the proposed treatment, the likelihood of the patient achieving the goals of the procedure, and any potential problems that might occur during the procedure or recuperation. Informed consent has been obtained.  Description of procedure: The patient was met in the preoperative area. All risks, benefits, and indications of the procedure were described in great detail. The patient consented to the procedure. Preoperative antibiotics were given. The patient was taken to the operative theater. General anesthesia was induced per the anesthesia service. A Foley catheter was placed.The patient was then placed in the left  lateral decubitus (right side up) position and prepped with care taken to pad all pressure points. She was then preppedand draped in the usual sterile fashion. A preoperative timeout was called.   Veress needle was then placed lateral to midline. The drop test was then performed which was normal. The abdomen was then insufflated to 15 mmH20. An 8 mm robotic port was then placed lateral to the rectus muscle. The abdomen was then inspected laparoscopically insuring no damage from port placement and Veress needle placement. 3 more 8 mm robotic ports were then placed along with 212 mm assistant ports in the standard locations for a right robotic nephrectomy. The robot was then docked. The right colon was then mobilized along the white line of [place] until the kidney was encountered. The liver was retracted through a new 5 mm port. The retroperitoneal space was then dissected into the ureter was encountered. It was clipped both proximally and medially. It was then incised. The ureter was then followed proximally to identify the hilum. There was an accessory right renal vein which was noted and clipped with wet clips and incised. The main renal vein was then encountered. Posterior to the main renal vein to separate renal arteries were identified. The renal arteries were separated from surrounding tissue. What clip was placed proximally and then each right renal artery was taken separately with a white load vascular stapler. The right renal vein was then also stapled. Further dissection revealed no further vessels. Another staple load was then used 2 separate the upper pole from the right adrenal gland. This preserved the right adrenal gland. At this point, remaining fibrofatty attachments to the kidney were dissected superiorly and laterally into the kidney was completely freed. It was then placed  in a specimen bag. FloSeal was then placed in the dissection bed. Hemostasis was excellent. The specimen bag was then passed  through the skin and secured near midline for retrieval later in the procedure. The robot was then undocked. The two12 mm ports were closed over with the aid of a [place] with an 0 Vicryl. The remaining incisions were reapproximated with subcuticular closure with a 4-0 Monocryl. Glue was placed on the skin incisions. The string from the specimen bag was then secured to the skin with a Tegaderm.  The patient was then undrapped. She was then transferred to the right lateral decubitus position (left side up). Pressure points were then again adequately padded. She was then prepped and draped in the usual sterile fashion. Access was gained to the abdomen in similar fashion as on the contralateral side. The ports are placed identical fashion as on the contralateral side except for place and being a Personal assistant. The robotic and was docked. The left colon again was mobilized along the white line of [place] a similar fashion as the contralateral side until the kidney was visualized. This point dissection took place around the superior aspect of the kidney until it contralateral thought to be the adrenal vein originating from the left renal vein. This was confirmed by further dissection. Posterior this the aorta was identified as well. Medial vasculature and periadrenal fat was ligated with bipolar instrument and incised. Once we assured that the adrenal gland was isolated and anatomy was confirmed after finding the aforementioned landmarks the left adrenal vein was clipped twice proximally and once distally. It was incised. The adrenal gland was carefully dissected off the aorta ligating any small feeding vessels as needed with the bipolar instrument and incising. Attention was then turned to dissecting along the superior aspect the right kidney to develop the plane between the right kidney and the left adrenal gland. Bipolar his mentation was used as needed for quite relation. Eventually the left adrenal gland was  isolated to a pedicle which was taken with a white vascular load stapler. At this point the left adrenal gland was free and placed in a specimen bag. The left kidney appeared healthy and pink with good blood flow. Hemostasis was excellent. Surgical was placed in the surgical bed. The robot was then undocked. The 212 mm ports were then connected resulting approximate 5 cm paramedian vertical incision. Both the right kidney specimen bag and left adrenal specimen bag and extracted through this incision. The fascia was then closed with interrupted figure-of-eight 0 PDS suture. Subcuticular closure of the large incision took place with 3-0 Vicryl. The skin incisions were then closed with 4-0 Monocryl subcuticularly. Glue was placed in incisions. However, before closing the incisions Exparel was placed both in the fascial muscle layers as well as around the skin edges. The patient was then awoken from anesthesia and transferred in stable condition to the postanesthesia care unit.  Dr. Tresa Moore provided necessary bedside assistance throughout the procedure well and was under about a council. Without his assistance, the procedure would not have been possible.  Plan: The patient will be admitted for routine postoperative care. We will need to keep a close eye on her renal function urine output is she may necessitate dialysis due to her low preoperative GFR. We will also have to monitor for signs and symptoms of adrenal crisis though this is not expected as her right adrenal gland was not resected during this procedure.  [name], M.D.

## 2017-03-11 LAB — BASIC METABOLIC PANEL
Anion gap: 9 (ref 5–15)
BUN: 45 mg/dL — ABNORMAL HIGH (ref 6–20)
CO2: 20 mmol/L — ABNORMAL LOW (ref 22–32)
Calcium: 7.4 mg/dL — ABNORMAL LOW (ref 8.9–10.3)
Chloride: 112 mmol/L — ABNORMAL HIGH (ref 101–111)
Creatinine, Ser: 3.14 mg/dL — ABNORMAL HIGH (ref 0.44–1.00)
GFR calc Af Amer: 17 mL/min — ABNORMAL LOW (ref >60)
GFR calc non Af Amer: 14 mL/min — ABNORMAL LOW (ref >60)
Glucose, Bld: 144 mg/dL — ABNORMAL HIGH (ref 65–99)
Potassium: 5.6 mmol/L — ABNORMAL HIGH (ref 3.5–5.1)
Sodium: 141 mmol/L (ref 135–145)

## 2017-03-11 LAB — GLUCOSE, CAPILLARY
Glucose-Capillary: 137 mg/dL — ABNORMAL HIGH (ref 65–99)
Glucose-Capillary: 144 mg/dL — ABNORMAL HIGH (ref 65–99)
Glucose-Capillary: 155 mg/dL — ABNORMAL HIGH (ref 65–99)
Glucose-Capillary: 171 mg/dL — ABNORMAL HIGH (ref 65–99)
Glucose-Capillary: 194 mg/dL — ABNORMAL HIGH (ref 65–99)
Glucose-Capillary: 226 mg/dL — ABNORMAL HIGH (ref 65–99)

## 2017-03-11 LAB — CBC
HCT: 25.9 % — ABNORMAL LOW (ref 36.0–46.0)
Hemoglobin: 8.6 g/dL — ABNORMAL LOW (ref 12.0–15.0)
MCH: 30.4 pg (ref 26.0–34.0)
MCHC: 33.2 g/dL (ref 30.0–36.0)
MCV: 91.5 fL (ref 78.0–100.0)
Platelets: 148 10*3/uL — ABNORMAL LOW (ref 150–400)
RBC: 2.83 MIL/uL — ABNORMAL LOW (ref 3.87–5.11)
RDW: 14.1 % (ref 11.5–15.5)
WBC: 7.3 10*3/uL (ref 4.0–10.5)

## 2017-03-11 MED ORDER — ACETAMINOPHEN 325 MG PO TABS
650.0000 mg | ORAL_TABLET | ORAL | Status: DC | PRN
  Administered 2017-03-11 (×2): 650 mg via ORAL
  Filled 2017-03-11 (×3): qty 2

## 2017-03-11 MED ORDER — SODIUM CHLORIDE 0.9 % IV BOLUS (SEPSIS)
500.0000 mL | Freq: Once | INTRAVENOUS | Status: AC
  Administered 2017-03-11: 500 mL via INTRAVENOUS

## 2017-03-11 NOTE — Progress Notes (Signed)
No events overnight Pain controlled No n/v/f/c/flatus/BM Cr. 3.1, K 5.6, 880 UOP  Vitals:   03/10/17 1430 03/10/17 2030 03/11/17 0408 03/11/17 0722  BP: 140/70 137/68 119/60   Pulse: 75 82 85   Resp: 18 16 18    Temp: 98 F (36.7 C) 97.6 F (36.4 C) 99 F (37.2 C)   TempSrc:  Oral Oral   SpO2: 100% 99% 93% 92%  Weight:      Height:       I/O last 3 completed shifts: In: 2913.8 [P.O.:120; I.V.:2443.8; IV Piggyback:350] Out: 1085 [Urine:835; Blood:250] No intake/output data recorded.   NAD Soft approp t, mild-mod D. Inc c/d/i with mild bruising Foley clear yellow  CBC    Component Value Date/Time   WBC 7.3 03/11/2017 0459   RBC 2.83 (L) 03/11/2017 0459   HGB 8.6 (L) 03/11/2017 0459   HCT 25.9 (L) 03/11/2017 0459   PLT 148 (L) 03/11/2017 0459   MCV 91.5 03/11/2017 0459   MCH 30.4 03/11/2017 0459   MCHC 33.2 03/11/2017 0459   RDW 14.1 03/11/2017 0459   LYMPHSABS 1,584 09/24/2016 1205   MONOABS 360 09/24/2016 1205   EOSABS 144 09/24/2016 1205   BASOSABS 0 09/24/2016 1205   BMP Latest Ref Rng & Units 03/11/2017 03/04/2017 12/30/2016  Glucose 65 - 99 mg/dL 144(H) 147(H) 111(H)  BUN 6 - 20 mg/dL 45(H) 30(H) 34(H)  Creatinine 0.44 - 1.00 mg/dL 3.14(H) 1.99(H) 2.12(H)  Sodium 135 - 145 mmol/L 141 143 143  Potassium 3.5 - 5.1 mmol/L 5.6(H) 4.5 4.2  Chloride 101 - 111 mmol/L 112(H) 108 106  CO2 22 - 32 mmol/L 20(L) 25 23  Calcium 8.9 - 10.3 mg/dL 7.4(L) 8.8(L) 8.7   POD 1 robo R nx, L adrenalectomy -adv diet to fulls -monitor renal function/UOP closely. Bolus 500 cc now. Recheck labs in AM. -continue foley for now for accurate UOP -dicussed importance of ambulation with patient -pain control -hopefully home tomorrow pending renal status

## 2017-03-11 NOTE — Progress Notes (Signed)
Inpatient Diabetes Program Recommendations  AACE/ADA: New Consensus Statement on Inpatient Glycemic Control (2015)  Target Ranges:  Prepandial:   less than 140 mg/dL      Peak postprandial:   less than 180 mg/dL (1-2 hours)      Critically ill patients:  140 - 180 mg/dL   Lab Results  Component Value Date   GLUCAP 137 (H) 03/11/2017   HGBA1C 8.3 (H) 03/04/2017    Review of Glycemic ControlResults for KIRBIE, STODGHILL (MRN [id number]) as of 03/11/2017 10:33  Ref. Range 03/10/2017 16:11 03/10/2017 20:24 03/10/2017 23:45 03/11/2017 04:06 03/11/2017 07:52  Glucose-Capillary Latest Ref Range: 65 - 99 mg/dL 267 (H) 155 (H) 157 (H) 144 (H) 137 (H)   Diabetes history: Type 2  Outpatient Diabetes medications: Tresiba 150 units daily- note that patient took [place] 75 units yesterday morning prior to surgery, Victoza 1.2 mg daily  Current orders for Inpatient glycemic control:  Novolog TCTS scale q 4 hours  Inpatient Diabetes Program Recommendations:   Note that patient was on large dose of Tresiba at home prior to surgery.  While in the hospital, consider adding Lantus 25 units daily.  Also consider changing Novolog correction scale to moderate q 4 hours.   Thanks, Adah Perl, RN, BC-ADM Inpatient Diabetes Coordinator Pager [phone] (8a-5p)

## 2017-03-11 NOTE — Care Management Note (Signed)
Case Management Note  Patient Details  Name: [name] MRN: [id number] Date of Birth: [birth year]  Subjective/Objective: 66 y/o f admitted w/R renal mass. POD#1 R radical nephrectomy. From home.                   Action/Plan:d/c plan home.   Expected Discharge Date:                  Expected Discharge Plan:  Home/Self Care  In-House Referral:     Discharge planning Services  CM Consult  Post Acute Care Choice:    Choice offered to:     DME Arranged:    DME Agency:     HH Arranged:    HH Agency:     Status of Service:  In process, will continue to follow  If discussed at Long Length of Stay Meetings, dates discussed:    Additional Comments:  Dessa Phi, RN 03/11/2017, 12:51 PM

## 2017-03-11 NOTE — Progress Notes (Signed)
When discussing ambulation with patient and informing her she needs to walk tonight, she stated "I just don't think I can, I just finally got some pain relief." Explained importance of early ambulation, but patient still refused. Instructed her to let me know if she changes her mind. Will continue to encourage patient to ambulate.

## 2017-03-12 LAB — CBC
HCT: 26.9 % — ABNORMAL LOW (ref 36.0–46.0)
Hemoglobin: 8.6 g/dL — ABNORMAL LOW (ref 12.0–15.0)
MCH: 29.1 pg (ref 26.0–34.0)
MCHC: 32 g/dL (ref 30.0–36.0)
MCV: 90.9 fL (ref 78.0–100.0)
Platelets: 147 10*3/uL — ABNORMAL LOW (ref 150–400)
RBC: 2.96 MIL/uL — ABNORMAL LOW (ref 3.87–5.11)
RDW: 13.7 % (ref 11.5–15.5)
WBC: 9.3 10*3/uL (ref 4.0–10.5)

## 2017-03-12 LAB — GLUCOSE, CAPILLARY
Glucose-Capillary: 112 mg/dL — ABNORMAL HIGH (ref 65–99)
Glucose-Capillary: 116 mg/dL — ABNORMAL HIGH (ref 65–99)
Glucose-Capillary: 153 mg/dL — ABNORMAL HIGH (ref 65–99)
Glucose-Capillary: 159 mg/dL — ABNORMAL HIGH (ref 65–99)
Glucose-Capillary: 164 mg/dL — ABNORMAL HIGH (ref 65–99)
Glucose-Capillary: 211 mg/dL — ABNORMAL HIGH (ref 65–99)

## 2017-03-12 LAB — BASIC METABOLIC PANEL
Anion gap: 9 (ref 5–15)
BUN: 45 mg/dL — ABNORMAL HIGH (ref 6–20)
CO2: 19 mmol/L — ABNORMAL LOW (ref 22–32)
Calcium: 7.7 mg/dL — ABNORMAL LOW (ref 8.9–10.3)
Chloride: 110 mmol/L (ref 101–111)
Creatinine, Ser: 3.61 mg/dL — ABNORMAL HIGH (ref 0.44–1.00)
GFR calc Af Amer: 14 mL/min — ABNORMAL LOW (ref >60)
GFR calc non Af Amer: 12 mL/min — ABNORMAL LOW (ref >60)
Glucose, Bld: 110 mg/dL — ABNORMAL HIGH (ref 65–99)
Potassium: 5.5 mmol/L — ABNORMAL HIGH (ref 3.5–5.1)
Sodium: 138 mmol/L (ref 135–145)

## 2017-03-12 NOTE — Evaluation (Signed)
Physical Therapy Evaluation Patient Details Name: [name] MRN: [id number] DOB: Aug 23, 1950 Today's Date: 03/12/2017   History of Present Illness  65 y.o. female admitted with renal mass, s/p R nephrectomy and L adrenalectomy on 03/10/2017. PMH: DM, HTN, OA, asthma.  Clinical Impression  Pt admitted with above diagnosis. Pt currently with functional limitations due to the deficits listed below (see PT Problem List). Max assist for transfers, pt ambulated 20' with RW, distance limited by dizziness. SNF recommended.  Pt will benefit from skilled PT to increase their independence and safety with mobility to allow discharge to the venue listed below.       Follow Up Recommendations SNF;Supervision/Assistance - 24 hour    Equipment Recommendations  Rolling walker with 5" wheels;3in1 (PT)    Recommendations for Other Services       Precautions / Restrictions Precautions Precautions: Fall Restrictions Weight Bearing Restrictions: No      Mobility  Bed Mobility               General bed mobility comments: up in recliner, RN stated bed to chair transfer took very long time, was very labored  Transfers Overall transfer level: Needs assistance Equipment used: Rolling walker (2 wheeled) Transfers: Sit to/from Stand Sit to Stand: Max assist         General transfer comment: Max A to rise, took 3 attempts, VCs to use momentum, VCs hand placement  Ambulation/Gait Ambulation/Gait assistance: Min assist Ambulation Distance (Feet): 20 Feet Assistive device: Rolling walker (2 wheeled) Gait Pattern/deviations: Step-to pattern;Decreased step length - right;Decreased step length - left;Trunk flexed;Shuffle   Gait velocity interpretation: Below normal speed for age/gender General Gait Details: distance limited by dizziness while walking (room spinning, stated this isn't baseline), VCs for proximity to [employer]            Wheelchair Mobility    Modified Rankin (Stroke  Patients Only)       Balance Overall balance assessment: Needs assistance Sitting-balance support: Bilateral upper extremity supported;Feet supported Sitting balance-Leahy Scale: Fair       Standing balance-Leahy Scale: Poor                               Pertinent Vitals/Pain Pain Assessment: 0-10 Pain Score: 3  Pain Location: surgical site Pain Descriptors / Indicators: Sore Pain Intervention(s): Limited activity within patient's tolerance;Monitored during session;Premedicated before session    Home Living Family/patient expects to be discharged to:: Private residence Living Arrangements: Alone Available Help at Discharge:  (none)   Home Access: Level entry     Home Layout: One level Home Equipment: None      Prior Function Level of Independence: Independent         Comments: furniture walked prior to admission, used scooter at grocery store, no family available to help     Hand Dominance        Extremity/Trunk Assessment   Upper Extremity Assessment Upper Extremity Assessment: Generalized weakness    Lower Extremity Assessment Lower Extremity Assessment: Generalized weakness (B knee ext -4/5)    Cervical / Trunk Assessment Cervical / Trunk Assessment: Kyphotic  Communication   Communication: No difficulties  Cognition Arousal/Alertness: Awake/alert Behavior During Therapy: WFL for tasks assessed/performed Overall Cognitive Status: No family/caregiver present to determine baseline cognitive functioning  General Comments: disoriented to time      General Comments      Exercises     Assessment/Plan    PT Assessment Patient needs continued PT services  PT Problem List Decreased strength;Decreased mobility;Decreased balance;Pain;Decreased activity tolerance       PT Treatment Interventions DME instruction;Gait training;Functional mobility training;Balance training;Therapeutic  exercise;Therapeutic activities;Patient/family education    PT Goals (Current goals can be found in the Care Plan section)  Acute Rehab PT Goals Patient Stated Goal: get stronger PT Goal Formulation: With patient Time For Goal Achievement: 03/26/17 Potential to Achieve Goals: Fair    Frequency Min 3X/week   Barriers to discharge Decreased caregiver support      Co-evaluation               AM-PAC PT "6 Clicks" Daily Activity  Outcome Measure Difficulty turning over in bed (including adjusting bedclothes, sheets and blankets)?: Unable Difficulty moving from lying on back to sitting on the side of the bed? : Unable Difficulty sitting down on and standing up from a chair with arms (e.g., wheelchair, bedside commode, etc,.)?: Unable Help needed moving to and from a bed to chair (including a wheelchair)?: A Lot Help needed walking in hospital room?: A Lot Help needed climbing 3-5 steps with a railing? : Total 6 Click Score: 8    End of Session Equipment Utilized During Treatment: Gait belt Activity Tolerance: Patient limited by fatigue;Treatment limited secondary to medical complications (Comment) (dizziness) Patient left: in chair;with call bell/phone within reach;with chair alarm set Nurse Communication: Mobility status PT Visit Diagnosis: Unsteadiness on feet (R26.81);Difficulty in walking, not elsewhere classified (R26.2);Dizziness and giddiness (R42)    Time: 3825-0539 PT Time Calculation (min) (ACUTE ONLY): 18 min   Charges:   PT Evaluation $PT Eval Moderate Complexity: 1 Mod     PT G Codes:       Philomena Doheny 03/12/2017, 11:24 AM [phone]

## 2017-03-12 NOTE — Clinical Social Work Note (Signed)
Clinical Social Work Assessment  Patient Details  Name: [name] MRN: [id number] Date of Birth: 1950-12-20  Date of referral:  03/12/17               Reason for consult:  Discharge Planning, Facility Placement                Permission sought to share information with:  Chartered certified accountant granted to share information::  Yes, Verbal Permission Granted  Name::        Agency::     Relationship::     Contact Information:     Housing/Transportation Living arrangements for the past 2 months:  Single Family Home Source of Information:  Patient, Adult Children Patient Interpreter Needed:  None Criminal Activity/Legal Involvement Pertinent to Current Situation/Hospitalization:  No - Comment as needed Significant Relationships:  Adult Children Lives with:  Self Do you feel safe going back to the place where you live?   (SNF) Need for family participation in patient care:  Yes (Comment)  Care giving concerns:  Pt needs more assistance than is available at home following hospital dc.   Social Worker assessment / plan: Pt hospitalized from home on 03/10/17 with  right renal neoplasm and adrenal mass. PT has recommended SNF at dc. CSW met with pt / spoke with son, Holland Kotter [phone] ( with pt's permission ) to review recommendations. Pt / son are in agreement with [facility] at dc. SNF search has been initiated and bed offers pending. CSW will continue to follow to assist with dc planning needs.  Employment status:  Retired Nurse, adult PT Recommendations:  [place] / Referral to community resources:  [place]  Patient/Family's Response to care:  Pt / son are in agreement with SNF placement.  Patient/Family's Understanding of and Emotional Response to Diagnosis, Current Treatment, and Prognosis:  Pt reports that she feels weak / tired. " I think I need rehab. " Support / reassurance  provided.  Emotional Assessment Appearance:  Appears stated age Attitude/Demeanor/Rapport:  Other (cooperative) Affect (typically observed):  Appropriate, Calm Orientation:  Oriented to Self, Oriented to Place, Oriented to  Time, Oriented to Situation Alcohol / Substance use:  Not Applicable Psych involvement (Current and /or in the community):  No (Comment)  Discharge Needs  Concerns to be addressed:  Discharge Planning Concerns Readmission within the last 30 days:  No Current discharge risk:  None Barriers to Discharge:  No Barriers Identified   Loraine Maple  [phone] 03/12/2017, 3:34 PM

## 2017-03-12 NOTE — Clinical Social Work Placement (Signed)
   CLINICAL SOCIAL WORK PLACEMENT  NOTE  Date:  03/12/2017  Patient Details  Name: [name] MRN: [id number] Date of Birth: [birth year]  Clinical Social Work is seeking post-discharge placement for this patient at the [place] level of care (*CSW will initial, date and re-position this form in  chart as items are completed):  Yes   Patient/family provided with [facility] Work Department's list of facilities offering this level of care within the geographic area requested by the patient (or if unable, by the patient's family).  Yes   Patient/family informed of their freedom to choose among providers that offer the needed level of care, that participate in Medicare, Medicaid or managed care program needed by the patient, have an available bed and are willing to accept the patient.  Yes   Patient/family informed of [facility]'s ownership interest in [facility] and [facility], as well as of the fact that they are under no obligation to receive care at these facilities.  PASRR submitted to EDS on 03/05/17     PASRR number received on 03/12/17     Existing PASRR number confirmed on       FL2 transmitted to all facilities in geographic area requested by pt/family on 03/12/17     FL2 transmitted to all facilities within larger geographic area on       Patient informed that his/her managed care company has contracts with or will negotiate with certain facilities, including the following:            Patient/family informed of bed offers received.  Patient chooses bed at       Physician recommends and patient chooses bed at      Patient to be transferred to   on  .  Patient to be transferred to facility by       Patient family notified on   of transfer.  Name of family member notified:        PHYSICIAN       Additional Comment:    _______________________________________________ Luretha Rued, [place] 03/12/2017, 3:46  PM

## 2017-03-12 NOTE — Progress Notes (Addendum)
Low grade fever overnight Mild nausea overnight Pain controlled No n/f/c/flatus/BM Cr. 3.6, K 5.5, 550 UOP  Patient was in chair yesterday. Patient continues to refuse ambulation.  Vitals:   03/11/17 2009 03/11/17 2223 03/11/17 2345 03/12/17 0532  BP: (!) 122/47   (!) 157/67  Pulse: 94   88  Resp: 18   19  Temp: (!) 100.9 F (38.3 C) 100 F (37.8 C) 98.2 F (36.8 C) (!) 97.5 F (36.4 C)  TempSrc: Oral  Axillary Axillary  SpO2: 92%   95%  Weight:      Height:       I/O last 3 completed shifts: In: 4548.8 [P.O.:1080; I.V.:3118.8; IV Piggyback:350] Out: 1505 [Urine:1035; Blood:250] Total I/O In: 6979 [P.O.:60; I.V.:1125] Out: 325 [Urine:325]   NAD Warm to touch Soft approp t, mild-mod D. Inc c/d/i with mild bruising Foley clear yellow  CBC    Component Value Date/Time   WBC 7.3 03/11/2017 0459   RBC 2.83 (L) 03/11/2017 0459   HGB 8.6 (L) 03/11/2017 0459   HCT 25.9 (L) 03/11/2017 0459   PLT 148 (L) 03/11/2017 0459   MCV 91.5 03/11/2017 0459   MCH 30.4 03/11/2017 0459   MCHC 33.2 03/11/2017 0459   RDW 14.1 03/11/2017 0459   LYMPHSABS 1,584 09/24/2016 1205   MONOABS 360 09/24/2016 1205   EOSABS 144 09/24/2016 1205   BASOSABS 0 09/24/2016 1205   BMP Latest Ref Rng & Units 03/12/2017 03/11/2017 03/04/2017  Glucose 65 - 99 mg/dL 110(H) 144(H) 147(H)  BUN 6 - 20 mg/dL 45(H) 45(H) 30(H)  Creatinine 0.44 - 1.00 mg/dL 3.61(H) 3.14(H) 1.99(H)  Sodium 135 - 145 mmol/L 138 141 143  Potassium 3.5 - 5.1 mmol/L 5.5(H) 5.6(H) 4.5  Chloride 101 - 111 mmol/L 110 112(H) 108  CO2 22 - 32 mmol/L 19(L) 20(L) 25  Calcium 8.9 - 10.3 mg/dL 7.7(L) 7.4(L) 8.8(L)   POD 2 robo R nx, L adrenalectomy. Low grade fever overnight. Low uop with slight rise in Cr. K is stable -adv diet to diabetic -monitor renal function/UOP closely. Low uop w/ slight right in Cr. K is stable. If UOP/renal function worsens tomorrow, will likely need to consult nephrology -continue foley for now for accurate  UOP -d/c IVF - patient is +4L over last 2 days -again dicussed importance of ambulation with patient. Especially as related to her low grade fever and her risk for PNA/DVT increasing by lack of ambulation. Also discussed using IS -PT/OT eval -pain control

## 2017-03-12 NOTE — NC FL2 (Signed)
[place] LEVEL OF CARE SCREENING TOOL     IDENTIFICATION  Patient Name: [name] Birthdate: [birth year] Sex: female Admission Date (Current Location): 03/10/2017  [facility] and [place] Number:  Herbalist and Address:  [facility],  [address], [place]      Provider Number: [id number]  Attending Physician Name and Address:  Nickie Retort, MD  Relative Name and Phone Number:       Current Level of Care: Hospital Recommended Level of Care: [place] Prior Approval Number:    Date Approved/Denied:   PASRR Number: Penicillins  Discharge Plan: SNF    Current Diagnoses: Patient Active Problem List   Diagnosis Date Noted  . Right renal mass 03/10/2017  . Diabetic ulcer of right foot associated with type 2 diabetes mellitus ([place])   . Type 2 diabetes, controlled, with neuropathy ([place]) 06/22/2013  . Ulcer of other part of foot 06/22/2013    Orientation RESPIRATION BLADDER Height & Weight     Self, Time, Situation, Place  Normal Indwelling catheter Weight: 116.6 kg (257 lb) Height:  5\' 7"  (170.2 cm)  BEHAVIORAL SYMPTOMS/MOOD NEUROLOGICAL BOWEL NUTRITION STATUS  Other (Comment) (no behaviors)   Continent Diet  AMBULATORY STATUS COMMUNICATION OF NEEDS Skin   Limited Assist Verbally Surgical wounds                       Personal Care Assistance Level of Assistance  Bathing, Feeding, Dressing Bathing Assistance: Limited assistance Feeding assistance: Independent Dressing Assistance: Limited assistance     Functional Limitations Info  Sight, Hearing, Speech Sight Info: Adequate Hearing Info: Adequate Speech Info: Adequate    SPECIAL CARE FACTORS FREQUENCY  PT (By licensed PT), OT (By licensed OT)     PT Frequency: 5x wk OT Frequency: 5x wk            Contractures Contractures Info: Not present    Additional Factors Info  Code Status, Allergies Code Status Info: Full  Code Allergies Info: Penicillins           Current Medications (03/12/2017):  This is the current hospital active medication list Current Facility-Administered Medications  Medication Dose Route Frequency Provider Last Rate Last Dose  . acetaminophen (TYLENOL) tablet 650 mg  650 mg Oral Q4H PRN Nickie Retort, MD   650 mg at 03/11/17 2240  . albuterol (PROVENTIL) (2.5 MG/3ML) 0.083% nebulizer solution 2.5 mg  2.5 mg Nebulization Q6H PRN Nickie Retort, MD      . amLODipine (NORVASC) tablet 10 mg  10 mg Oral Daily Nickie Retort, MD   10 mg at 03/12/17 0816  . docusate sodium (COLACE) capsule 100 mg  100 mg Oral BID Nickie Retort, MD   100 mg at 03/12/17 0816  . fluticasone furoate-vilanterol (BREO ELLIPTA) 100-25 MCG/INH 1 puff  1 puff Inhalation Daily Nickie Retort, MD   1 puff at 03/12/17 0744  . gabapentin (NEURONTIN) capsule 300 mg  300 mg Oral QHS Nickie Retort, MD   300 mg at 03/11/17 2115  . heparin injection 5,000 Units  5,000 Units Subcutaneous Q8H Nickie Retort, MD   5,000 Units at 03/12/17 0518  . HYDROcodone-acetaminophen (NORCO/VICODIN) 5-325 MG per tablet 1-2 tablet  1-2 tablet Oral Q4H PRN Nickie Retort, MD   2 tablet at 03/12/17 0744  . insulin aspart (novoLOG) injection 0-24 Units  0-24 Units Subcutaneous Q4H Nickie Retort, MD  2 Units at 03/12/17 1146  . MEDLINE mouth rinse  15 mL Mouth Rinse BID Nickie Retort, MD   15 mL at 03/11/17 1116  . morphine 2 MG/ML injection 2-4 mg  2-4 mg Intravenous Q2H PRN Nickie Retort, MD   4 mg at 03/10/17 1851  . ondansetron (ZOFRAN) injection 4 mg  4 mg Intravenous Q4H PRN Nickie Retort, MD      . pravastatin (PRAVACHOL) tablet 40 mg  40 mg Oral QPM Nickie Retort, MD   40 mg at 03/11/17 1733     Discharge Medications: Please see discharge summary for a list of discharge medications.  Relevant Imaging Results:  Relevant Lab Results:   Additional  Information ss # [id number]  Yamile Roedl, Randall An, LCSW

## 2017-03-13 LAB — BASIC METABOLIC PANEL WITH GFR
Anion gap: 9 (ref 5–15)
BUN: 52 mg/dL — ABNORMAL HIGH (ref 6–20)
CO2: 19 mmol/L — ABNORMAL LOW (ref 22–32)
Calcium: 7.8 mg/dL — ABNORMAL LOW (ref 8.9–10.3)
Chloride: 111 mmol/L (ref 101–111)
Creatinine, Ser: 3.92 mg/dL — ABNORMAL HIGH (ref 0.44–1.00)
GFR calc Af Amer: 13 mL/min — ABNORMAL LOW
GFR calc non Af Amer: 11 mL/min — ABNORMAL LOW
Glucose, Bld: 126 mg/dL — ABNORMAL HIGH (ref 65–99)
Potassium: 4.6 mmol/L (ref 3.5–5.1)
Sodium: 139 mmol/L (ref 135–145)

## 2017-03-13 LAB — CBC
HCT: 24.2 % — ABNORMAL LOW (ref 36.0–46.0)
Hemoglobin: 8.1 g/dL — ABNORMAL LOW (ref 12.0–15.0)
MCH: 30.6 pg (ref 26.0–34.0)
MCHC: 33.5 g/dL (ref 30.0–36.0)
MCV: 91.3 fL (ref 78.0–100.0)
Platelets: 141 K/uL — ABNORMAL LOW (ref 150–400)
RBC: 2.65 MIL/uL — ABNORMAL LOW (ref 3.87–5.11)
RDW: 13.6 % (ref 11.5–15.5)
WBC: 7.9 K/uL (ref 4.0–10.5)

## 2017-03-13 LAB — GLUCOSE, CAPILLARY: Glucose-Capillary: 124 mg/dL — ABNORMAL HIGH (ref 65–99)

## 2017-03-13 MED ORDER — HYDROCODONE-ACETAMINOPHEN 5-325 MG PO TABS: 1.0000 | ORAL_TABLET | ORAL | 0 refills | Status: DC | PRN

## 2017-03-13 NOTE — Progress Notes (Signed)
Physical Therapy Treatment Patient Details Name: [name] MRN: [id number] DOB: [birth year] Today's Date: 03/13/2017    History of Present Illness 66 y.o. female admitted with renal mass, s/p R nephrectomy and L adrenalectomy on 03/10/2017. PMH: DM, HTN, OA, asthma.    PT Comments    Pt tolerated increased ambulation distance of 35' with RW today. SNF recommended as pt requires assist for bed mobility and transfers.   Follow Up Recommendations  SNF;Supervision/Assistance - 24 hour     Equipment Recommendations  Rolling walker with 5" wheels;3in1 (PT)    Recommendations for Other Services       Precautions / Restrictions Precautions Precautions: Fall    Mobility  Bed Mobility Overal bed mobility: Needs Assistance Bed Mobility: Sit to Supine Rolling: Min assist Sidelying to sit: Max assist   Sit to supine: Mod assist   General bed mobility comments: mod A for LEs into bed  Transfers Overall transfer level: Needs assistance Equipment used: Rolling walker (2 wheeled) Transfers: Sit to/from Stand Sit to Stand: Mod assist Stand pivot transfers: Mod assist       General transfer comment: VCs hand placement, increased time but improved from yesterday, mod A to power up  Ambulation/Gait Ambulation/Gait assistance: Min guard Ambulation Distance (Feet): 48 Feet Assistive device: Rolling walker (2 wheeled) Gait Pattern/deviations: Step-to pattern;Decreased step length - right;Decreased step length - left;Trunk flexed;Shuffle   Gait velocity interpretation: Below normal speed for age/gender General Gait Details: RLE externally rotated (pt stated this is baseline), no LOB   Stairs            Wheelchair Mobility    Modified Rankin (Stroke Patients Only)       Balance Overall balance assessment: Needs assistance Sitting-balance support: Bilateral upper extremity supported;Feet supported Sitting balance-Leahy Scale: Fair       Standing balance-Leahy  Scale: Poor                              Cognition Arousal/Alertness: Awake/alert Behavior During Therapy: WFL for tasks assessed/performed Overall Cognitive Status: Within Functional Limits for tasks assessed                                        Exercises      General Comments        Pertinent Vitals/Pain Pain Assessment: No/denies pain Pain Score: 2  Pain Location: surgical site Pain Descriptors / Indicators: Sore Pain Intervention(s): Limited activity within patient's tolerance;Repositioned    Home Living Family/patient expects to be discharged to:: Private residence Living Arrangements: Alone Available Help at Discharge:  (none) Type of Home: House Home Access: Level entry   Home Layout: One level Home Equipment: None      Prior Function Level of Independence: Independent      Comments: furniture walked prior to admission, used scooter at grocery store, no family available to help   PT Goals (current goals can now be found in the care plan section) Acute Rehab PT Goals Patient Stated Goal: get home to her hamsters and other pets PT Goal Formulation: With patient Time For Goal Achievement: 03/26/17 Potential to Achieve Goals: Fair Progress towards PT goals: Progressing toward goals    Frequency    Min 3X/week      PT Plan Current plan remains appropriate    Co-evaluation  AM-PAC PT "6 Clicks" Daily Activity  Outcome Measure  Difficulty turning over in bed (including adjusting bedclothes, sheets and blankets)?: Unable Difficulty moving from lying on back to sitting on the side of the bed? : Unable Difficulty sitting down on and standing up from a chair with arms (e.g., wheelchair, bedside commode, etc,.)?: Unable Help needed moving to and from a bed to chair (including a wheelchair)?: A Lot Help needed walking in hospital room?: A Little Help needed climbing 3-5 steps with a railing? : Total 6 Click  Score: 9    End of Session Equipment Utilized During Treatment: Gait belt Activity Tolerance: Patient tolerated treatment well (dizziness) Patient left: with call bell/phone within reach;in bed;with bed alarm set Nurse Communication: Mobility status PT Visit Diagnosis: Unsteadiness on feet (R26.81);Difficulty in walking, not elsewhere classified (R26.2);Dizziness and giddiness (R42)     Time: 4163-8453 PT Time Calculation (min) (ACUTE ONLY): 16 min  Charges:  $Gait Training: 8-22 mins                    G Codes:          Philomena Doheny 03/13/2017, 1:54 PM [phone]

## 2017-03-13 NOTE — Evaluation (Signed)
Occupational Therapy Evaluation Patient Details Name: [name] MRN: [id number] DOB: 1950-10-06 Today's Date: 03/13/2017    History of Present Illness 66 y.o. female admitted with renal mass, s/p R nephrectomy and L adrenalectomy on 03/10/2017. PMH: DM, HTN, OA, asthma.   Clinical Impression   Pt admitted with renal mass. Pt currently with functional limitations due to the deficits listed below (see OT Problem List).  Pt will benefit from skilled OT to increase their safety and independence with ADL and functional mobility for ADL to facilitate discharge to venue listed below.      Follow Up Recommendations  SNF    Equipment Recommendations  None recommended by OT       Precautions / Restrictions Precautions Precautions: Fall      Mobility Bed Mobility Overal bed mobility: Needs Assistance Bed Mobility: Sidelying to Sit;Rolling Rolling: Min assist Sidelying to sit: Max assist       General bed mobility comments: increased time  Transfers Overall transfer level: Needs assistance Equipment used: Rolling walker (2 wheeled) Transfers: Sit to/from [facility] Sit to Stand: Mod assist Stand pivot transfers: Mod assist       General transfer comment: increased time but did much better than last OT visit        ADL either performed or assessed with clinical judgement   ADL Overall ADL's : Needs assistance/impaired Eating/Feeding: Set up;Sitting   Grooming: Sitting;Cueing for safety;Cueing for sequencing;Minimal assistance Grooming Details (indicate cue type and reason): VC for completeness of brushing hair Upper Body Bathing: Minimal assistance;Sitting   Lower Body Bathing: Maximal assistance;Sit to/from stand;Cueing for safety;Cueing for sequencing   Upper Body Dressing : Minimal assistance;Sitting   Lower Body Dressing: Maximal assistance;Sit to/from stand;Cueing for safety;Cueing for sequencing   Toilet Transfer: RW;Moderate  assistance Toilet Transfer Details (indicate cue type and reason): bed to chair Toileting- Clothing Manipulation and Hygiene: Moderate assistance;Sit to/from stand;Cueing for safety;Cueing for sequencing         General ADL Comments: pt does not have A at home.  Pt will need St SNF.     Vision Patient Visual Report: No change from baseline              Pertinent Vitals/Pain Pain Score: 2  Pain Location: surgical site Pain Descriptors / Indicators: Sore Pain Intervention(s): Limited activity within patient's tolerance;Repositioned        Extremity/Trunk Assessment Upper Extremity Assessment Upper Extremity Assessment: Generalized weakness           Communication Communication Communication: No difficulties   Cognition Arousal/Alertness: Awake/alert Behavior During Therapy: WFL for tasks assessed/performed Overall Cognitive Status: Within Functional Limits for tasks assessed                                                Home Living Family/patient expects to be discharged to:: Private residence Living Arrangements: Alone Available Help at Discharge:  (none) Type of Home: House Home Access: Level entry     Home Layout: One level     Bathroom Shower/Tub: Tub/shower unit         Home Equipment: None          Prior Functioning/Environment Level of Independence: Independent        Comments: furniture walked prior to admission, used scooter at grocery store, no family available to help  OT Problem List: Decreased strength;Decreased activity tolerance;Impaired balance (sitting and/or standing);Decreased knowledge of use of DME or AE      OT Treatment/Interventions: Self-care/ADL training;Patient/family education;DME and/or AE instruction    OT Goals(Current goals can be found in the care plan section) Acute Rehab OT Goals Patient Stated Goal: get stronger OT Goal Formulation: With patient Time For Goal Achievement:  03/20/17 Potential to Achieve Goals: Good  OT Frequency: Min 2X/week   Barriers to D/C: Decreased caregiver support          Co-evaluation              AM-PAC PT "6 Clicks" Daily Activity     Outcome Measure Help from another person eating meals?: None Help from another person taking care of personal grooming?: A Little Help from another person toileting, which includes using toliet, bedpan, or urinal?: A Lot Help from another person bathing (including washing, rinsing, drying)?: A Lot Help from another person to put on and taking off regular upper body clothing?: A Little Help from another person to put on and taking off regular lower body clothing?: A Lot 6 Click Score: 16   End of Session Equipment Utilized During Treatment: Rolling walker Nurse Communication: Mobility status  Activity Tolerance: Patient tolerated treatment well Patient left: in chair;with call bell/phone within reach  OT Visit Diagnosis: Unsteadiness on feet (R26.81);Muscle weakness (generalized) (M62.81)                Time: 9643-8381 OT Time Calculation (min): 24 min Charges:  OT General Charges $OT Visit: 1 Procedure OT Evaluation $OT Eval Moderate Complexity: 1 Procedure OT Treatments $Self Care/Home Management : 8-22 mins G-Codes:     Kari Baars, [place]  Payton Mccallum D 03/13/2017, 11:53 AM

## 2017-03-13 NOTE — Progress Notes (Signed)
Patient with difficulty ambulating. PT recommends SNF Tolerating diet Pain controlled No n/f/c/flatus/BM  Cr. 3.9 , K 4.6,1375 UOP   Vitals:   03/12/17 0816 03/12/17 1255 03/12/17 2032 03/13/17 0408  BP: (!) 123/56 132/85 (!) 144/60 (!) 124/50  Pulse: 93 87 (!) 102 85  Resp:  18 18 19   Temp: 99.9 F (37.7 C) 97.8 F (36.6 C) 98.1 F (36.7 C) 98.7 F (37.1 C)  TempSrc: Oral Oral Oral Axillary  SpO2: 92% 95% 94% 92%  Weight:      Height:       I/O last 3 completed shifts: In: 3832 [P.O.:410; I.V.:1125] Out: 1700 [Urine:1700] No intake/output data recorded.   NAD Warm to touch Soft approp t, mild-mod D. Inc c/d/i with mild bruising Foley clear yellow No LE edema  CBC    Component Value Date/Time   WBC 7.9 03/13/2017 0519   RBC 2.65 (L) 03/13/2017 0519   HGB 8.1 (L) 03/13/2017 0519   HCT 24.2 (L) 03/13/2017 0519   PLT 141 (L) 03/13/2017 0519   MCV 91.3 03/13/2017 0519   MCH 30.6 03/13/2017 0519   MCHC 33.5 03/13/2017 0519   RDW 13.6 03/13/2017 0519   LYMPHSABS 1,584 09/24/2016 1205   MONOABS 360 09/24/2016 1205   EOSABS 144 09/24/2016 1205   BASOSABS 0 09/24/2016 1205   BMP Latest Ref Rng & Units 03/13/2017 03/12/2017 03/11/2017  Glucose 65 - 99 mg/dL 126(H) 110(H) 144(H)  BUN 6 - 20 mg/dL 52(H) 45(H) 45(H)  Creatinine 0.44 - 1.00 mg/dL 3.92(H) 3.61(H) 3.14(H)  Sodium 135 - 145 mmol/L 139 138 141  Potassium 3.5 - 5.1 mmol/L 4.6 5.5(H) 5.6(H)  Chloride 101 - 111 mmol/L 111 110 112(H)  CO2 22 - 32 mmol/L 19(L) 19(L) 20(L)  Calcium 8.9 - 10.3 mg/dL 7.8(L) 7.7(L) 7.4(L)   POD 3 robo R nx, L adrenalectomy.  UOP doubled over last 24 hrs. K decreased. However there was another slight rise in Cr.  -continue diet -monitor renal function/UOP closely. UOP doubled and K decreased despite slight rise in Cr. Will hold off on nephrology consult for now. Recheck labs in AM. -continue foley for now for accurate UOP -ambulation/IS -appreciate PT eval -pain control -If Cr  stabilizes tomorrow, will plan for d/c to SNF. Will need very close nephrology follow up as outpatient.

## 2017-03-14 DIAGNOSIS — Z8631 Personal history of diabetic foot ulcer: Secondary | ICD-10-CM | POA: Diagnosis not present

## 2017-03-14 DIAGNOSIS — N183 Chronic kidney disease, stage 3 (moderate): Secondary | ICD-10-CM | POA: Diagnosis not present

## 2017-03-14 DIAGNOSIS — E119 Type 2 diabetes mellitus without complications: Secondary | ICD-10-CM | POA: Diagnosis not present

## 2017-03-14 DIAGNOSIS — D631 Anemia in chronic kidney disease: Secondary | ICD-10-CM | POA: Diagnosis not present

## 2017-03-14 DIAGNOSIS — K5900 Constipation, unspecified: Secondary | ICD-10-CM | POA: Diagnosis not present

## 2017-03-14 DIAGNOSIS — E1140 Type 2 diabetes mellitus with diabetic neuropathy, unspecified: Secondary | ICD-10-CM | POA: Diagnosis not present

## 2017-03-14 DIAGNOSIS — J45909 Unspecified asthma, uncomplicated: Secondary | ICD-10-CM | POA: Diagnosis not present

## 2017-03-14 DIAGNOSIS — E785 Hyperlipidemia, unspecified: Secondary | ICD-10-CM | POA: Diagnosis not present

## 2017-03-14 DIAGNOSIS — C7490 Malignant neoplasm of unspecified part of unspecified adrenal gland: Secondary | ICD-10-CM | POA: Diagnosis not present

## 2017-03-14 DIAGNOSIS — M6281 Muscle weakness (generalized): Secondary | ICD-10-CM | POA: Diagnosis not present

## 2017-03-14 DIAGNOSIS — R194 Change in bowel habit: Secondary | ICD-10-CM | POA: Diagnosis not present

## 2017-03-14 DIAGNOSIS — G4733 Obstructive sleep apnea (adult) (pediatric): Secondary | ICD-10-CM | POA: Diagnosis not present

## 2017-03-14 DIAGNOSIS — N2581 Secondary hyperparathyroidism of renal origin: Secondary | ICD-10-CM | POA: Diagnosis not present

## 2017-03-14 DIAGNOSIS — E1340 Other specified diabetes mellitus with diabetic neuropathy, unspecified: Secondary | ICD-10-CM | POA: Diagnosis not present

## 2017-03-14 DIAGNOSIS — I10 Essential (primary) hypertension: Secondary | ICD-10-CM | POA: Diagnosis not present

## 2017-03-14 DIAGNOSIS — E1122 Type 2 diabetes mellitus with diabetic chronic kidney disease: Secondary | ICD-10-CM | POA: Diagnosis not present

## 2017-03-14 DIAGNOSIS — R262 Difficulty in walking, not elsewhere classified: Secondary | ICD-10-CM | POA: Diagnosis not present

## 2017-03-14 DIAGNOSIS — C649 Malignant neoplasm of unspecified kidney, except renal pelvis: Secondary | ICD-10-CM | POA: Diagnosis not present

## 2017-03-14 LAB — GLUCOSE, CAPILLARY
Glucose-Capillary: 124 mg/dL — ABNORMAL HIGH (ref 65–99)
Glucose-Capillary: 147 mg/dL — ABNORMAL HIGH (ref 65–99)
Glucose-Capillary: 152 mg/dL — ABNORMAL HIGH (ref 65–99)
Glucose-Capillary: 154 mg/dL — ABNORMAL HIGH (ref 65–99)
Glucose-Capillary: 164 mg/dL — ABNORMAL HIGH (ref 65–99)
Glucose-Capillary: 180 mg/dL — ABNORMAL HIGH (ref 65–99)
Glucose-Capillary: 190 mg/dL — ABNORMAL HIGH (ref 65–99)
Glucose-Capillary: 198 mg/dL — ABNORMAL HIGH (ref 65–99)
Glucose-Capillary: 241 mg/dL — ABNORMAL HIGH (ref 65–99)

## 2017-03-14 LAB — BASIC METABOLIC PANEL
Anion gap: 5 (ref 5–15)
BUN: 56 mg/dL — ABNORMAL HIGH (ref 6–20)
CO2: 21 mmol/L — ABNORMAL LOW (ref 22–32)
Calcium: 8.2 mg/dL — ABNORMAL LOW (ref 8.9–10.3)
Chloride: 111 mmol/L (ref 101–111)
Creatinine, Ser: 3.83 mg/dL — ABNORMAL HIGH (ref 0.44–1.00)
GFR calc Af Amer: 13 mL/min — ABNORMAL LOW (ref >60)
GFR calc non Af Amer: 11 mL/min — ABNORMAL LOW (ref >60)
Glucose, Bld: 171 mg/dL — ABNORMAL HIGH (ref 65–99)
Potassium: 5 mmol/L (ref 3.5–5.1)
Sodium: 137 mmol/L (ref 135–145)

## 2017-03-14 MED ORDER — BISACODYL 10 MG RE SUPP
10.0000 mg | Freq: Once | RECTAL | Status: AC
  Administered 2017-03-14: 10 mg via RECTAL
  Filled 2017-03-14: qty 1

## 2017-03-14 NOTE — Progress Notes (Signed)
Report called to [facility] spoke to [facility] LPN

## 2017-03-14 NOTE — Discharge Summary (Signed)
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 Right renal mass 03/10/2017  . Diabetic ulcer of right foot associated with type 2 diabetes mellitus ([place])   . Type 2 diabetes, controlled, with neuropathy ([place]) 06/22/2013  . Ulcer of other part of foot 06/22/2013    History and Physical: For full details, please see admission history and physical. Briefly, [name] is a 66 y.o. year old patient with History of chronic kidney disease with a preoperative creatinine of approximately 2 was found have a 9-1/2 cm right renal mass and left adrenal mass concerning for a isolated solitary metastasis. She underwent a right radical adrenal sparing robotic nephrectomy and a robotic left adrenalectomy. She is midline hospital for routine postoperative care.   Hospital Course: Patient tolerated the procedure well.  She was then transferred to the floor after an uneventful PACU stay.  Her hospital course was uncomplicated.  On POD#4 she had met discharge criteria: was eating a regular diet, was up and ambulating independently,  pain was well controlled, was voiding without a catheter, and was ready to for discharge.   In regards to her renal function, her creatinine did slowly rise to a maximal level of 3.9. On the date of discharge it did decrease to 3.8. Potassium is stable at 5.0. She made over 1 L of urine output in 24 hours preceding discharge. She will be discharged and has been instructed to follow up with her nephrologist early next week as she may need modifications to her medications for the long-term. At this time, does not appear that she needs any urgent or emergent dialysis due to her urine output, renal function, and stable  potassium.  The patient did have issues with ambulation and was not felt to be strong enough to go directly home. She will be discharged to subacute nursing facility for rehabilitation. The ultimate goal will be discharged home from that facility once her strength has been recovered.  Physical exam on day of discharge: NAD Unlabored respirations Soft nontender nondistended. Incisions clean dry and intact with mild bruising Foley clear which was then removed No lower extremity edema   Laboratory values:   Recent Labs  03/12/17 1030 03/13/17 0519  WBC 9.3 7.9  HGB 8.6* 8.1*  HCT 26.9* 24.2*    Recent Labs  03/12/17 0538 03/13/17 0519 03/14/17 0452  NA 138 139 137  K 5.5* 4.6 5.0  CL 110 111 111  CO2 19* 19* 21*  GLUCOSE 110* 126* 171*  BUN 45* 52* 56*  CREATININE 3.61* 3.92* 3.83*  CALCIUM 7.7* 7.8* 8.2*   No results for input(s): LABPT, INR in the last 72 hours. No results for input(s): LABURIN in the last 72 hours. Results for orders placed or performed during the hospital encounter of 03/04/17  Urine culture     Status: Abnormal   Collection Time: 03/04/17 10:25 AM  Result Value Ref Range Status   Specimen Description URINE, RANDOM  Final   Special Requests NONE  Final   Culture MULTIPLE SPECIES PRESENT, SUGGEST RECOLLECTION (A)  Final   Report Status 03/05/2017 FINAL  Final    Disposition: Home  Discharge instruction: The patient was instructed to be ambulatory but told to refrain from heavy lifting, strenuous activity, or  driving.   Discharge medications: Allergies as of 03/14/2017      Reactions   Penicillins Other (See Comments)   From childhood      Medication List    STOP taking these medications   losartan 100 MG tablet Commonly known as:  COZAAR     TAKE these medications   albuterol 108 (90 Base) MCG/ACT inhaler Commonly known as:  PROVENTIL HFA;VENTOLIN HFA Inhale 2 puffs into the lungs every 6 (six) hours as needed for wheezing or  shortness of breath.   amLODipine 10 MG tablet Commonly known as:  NORVASC Take 1 tablet (10 mg total) by mouth daily. What changed:  when to take this   BREO ELLIPTA 100-25 MCG/INH Aepb Generic drug:  fluticasone furoate-vilanterol INHALE ONE PUFF BY MOUTH ONCE DAILY   gabapentin 300 MG capsule Commonly known as:  NEURONTIN Take 1 capsule (300 mg total) by mouth at bedtime. What changed:  how much to take  when to take this  additional instructions   HYDROcodone-acetaminophen 5-325 MG tablet Commonly known as:  NORCO/VICODIN Take 1-2 tablets by mouth every 4 (four) hours as needed for moderate pain.   liraglutide 18 MG/3ML Sopn 0.6 mg sq qd x 1 week then 1.16m sq qd What changed:  how much to take  how to take this  when to take this  additional instructions   PHILLIPS COLON HEALTH PO Take 1 capsule by mouth every evening.   pravastatin 40 MG tablet Commonly known as:  PRAVACHOL Take 1 tablet (40 mg total) by mouth daily. What changed:  when to take this   TRADJENTA 5 MG Tabs tablet Generic drug:  linagliptin TAKE 1 TABLET BY MOUTH ONCE DAILY   TRESIBA FLEXTOUCH 100 UNIT/ML Sopn FlexTouch Pen Generic drug:  insulin degludec INJECT 150 UNITS SUBCUTANEOUSLY ONCE DAILY   vitamin A 10000 UNIT capsule Take 10,000 Units by mouth daily.            Discharge Care Instructions        Start     Ordered   03/13/17 0000  HYDROcodone-acetaminophen (NORCO/VICODIN) 5-325 MG tablet  Every 4 hours PRN     03/13/17 0748      Followup:  Follow-up Information    BNickie Retort MD Follow up.   Specialty:  Urology Why:  As scheduled Contact information: [phone]        WEdrick Oh MD. Schedule an appointment as soon as possible for a visit.   Specialty:  Nephrology Why:  Next week Contact information: 3[place] [phone]

## 2017-03-14 NOTE — Progress Notes (Signed)
D/c to SNF via son .Voices no C/O.transferred to w/c w/ one assist.

## 2017-03-14 NOTE — Care Management Important Message (Signed)
Important Message  Patient Details  Name: [name] MRN: [id number] Date of Birth: 1951/01/06   Medicare Important Message Given:  Yes    Kerin Salen 03/14/2017, 11:33 AMImportant Message  Patient Details  Name: [name] MRN: [id number] Date of Birth: Feb 03, 1951   Medicare Important Message Given:  Yes    Kerin Salen 03/14/2017, 11:32 AM

## 2017-03-14 NOTE — Progress Notes (Signed)
D/cing to [facility].Son to pick up will not be here till around 1630.

## 2017-03-18 DIAGNOSIS — R194 Change in bowel habit: Secondary | ICD-10-CM | POA: Diagnosis not present

## 2017-03-18 DIAGNOSIS — K5900 Constipation, unspecified: Secondary | ICD-10-CM | POA: Diagnosis not present

## 2017-03-18 DIAGNOSIS — E1140 Type 2 diabetes mellitus with diabetic neuropathy, unspecified: Secondary | ICD-10-CM | POA: Diagnosis not present

## 2017-03-18 DIAGNOSIS — G4733 Obstructive sleep apnea (adult) (pediatric): Secondary | ICD-10-CM | POA: Diagnosis not present

## 2017-03-19 DIAGNOSIS — D631 Anemia in chronic kidney disease: Secondary | ICD-10-CM | POA: Diagnosis not present

## 2017-03-19 DIAGNOSIS — N183 Chronic kidney disease, stage 3 (moderate): Secondary | ICD-10-CM | POA: Diagnosis not present

## 2017-03-19 DIAGNOSIS — E1122 Type 2 diabetes mellitus with diabetic chronic kidney disease: Secondary | ICD-10-CM | POA: Diagnosis not present

## 2017-03-19 DIAGNOSIS — N2581 Secondary hyperparathyroidism of renal origin: Secondary | ICD-10-CM | POA: Diagnosis not present

## 2017-03-21 ENCOUNTER — Other Ambulatory Visit: Payer: Self-pay

## 2017-03-21 NOTE — Patient Outreach (Signed)
[facility]) Care Management  03/21/2017  [name] [birth year] [id number]   Medication Adherence call to [name] the reason for this call is because [name] is showing past due under [facility] Ins.on her pravastatin 40 mg spoke to patient  she is at a rehab home and  they are providing her medication there.    [place] Management Direct Dial [phone]  Fax [phone] [email or web address]

## 2017-03-26 DIAGNOSIS — C649 Malignant neoplasm of unspecified kidney, except renal pelvis: Secondary | ICD-10-CM | POA: Diagnosis not present

## 2017-03-26 DIAGNOSIS — M6281 Muscle weakness (generalized): Secondary | ICD-10-CM | POA: Diagnosis not present

## 2017-03-26 DIAGNOSIS — E119 Type 2 diabetes mellitus without complications: Secondary | ICD-10-CM | POA: Diagnosis not present

## 2017-03-26 DIAGNOSIS — Z8631 Personal history of diabetic foot ulcer: Secondary | ICD-10-CM | POA: Diagnosis not present

## 2017-04-07 ENCOUNTER — Ambulatory Visit (INDEPENDENT_AMBULATORY_CARE_PROVIDER_SITE_OTHER): Payer: Medicare Other | Admitting: Family Medicine

## 2017-04-07 ENCOUNTER — Encounter: Payer: Self-pay | Admitting: Family Medicine

## 2017-04-07 ENCOUNTER — Ambulatory Visit: Payer: Medicare Other | Admitting: Family Medicine

## 2017-04-07 VITALS — BP 152/64 | HR 62 | Temp 98.2°F | Resp 16 | Ht 67.0 in | Wt 254.0 lb

## 2017-04-07 DIAGNOSIS — C641 Malignant neoplasm of right kidney, except renal pelvis: Secondary | ICD-10-CM | POA: Diagnosis not present

## 2017-04-07 DIAGNOSIS — Z794 Long term (current) use of insulin: Secondary | ICD-10-CM

## 2017-04-07 DIAGNOSIS — Z23 Encounter for immunization: Secondary | ICD-10-CM | POA: Diagnosis not present

## 2017-04-07 DIAGNOSIS — Z09 Encounter for follow-up examination after completed treatment for conditions other than malignant neoplasm: Secondary | ICD-10-CM

## 2017-04-07 DIAGNOSIS — C649 Malignant neoplasm of unspecified kidney, except renal pelvis: Secondary | ICD-10-CM | POA: Diagnosis not present

## 2017-04-07 DIAGNOSIS — D631 Anemia in chronic kidney disease: Secondary | ICD-10-CM

## 2017-04-07 DIAGNOSIS — C7989 Secondary malignant neoplasm of other specified sites: Secondary | ICD-10-CM

## 2017-04-07 DIAGNOSIS — E119 Type 2 diabetes mellitus without complications: Secondary | ICD-10-CM

## 2017-04-07 DIAGNOSIS — N185 Chronic kidney disease, stage 5: Secondary | ICD-10-CM | POA: Diagnosis not present

## 2017-04-07 LAB — COMPLETE METABOLIC PANEL WITH GFR
AG Ratio: 1.5 (calc) (ref 1.0–2.5)
ALT: 7 U/L (ref 6–29)
AST: 8 U/L — ABNORMAL LOW (ref 10–35)
Albumin: 3.8 g/dL (ref 3.6–5.1)
Alkaline phosphatase (APISO): 85 U/L (ref 33–130)
BUN/Creatinine Ratio: 15 (calc) (ref 6–22)
BUN: 44 mg/dL — ABNORMAL HIGH (ref 7–25)
CO2: 22 mmol/L (ref 20–32)
Calcium: 8.9 mg/dL (ref 8.6–10.4)
Chloride: 111 mmol/L — ABNORMAL HIGH (ref 98–110)
Creat: 2.89 mg/dL — ABNORMAL HIGH (ref 0.50–0.99)
GFR, Est African American: 19 mL/min/{1.73_m2} — ABNORMAL LOW (ref >=60)
GFR, Est Non African American: 16 mL/min/{1.73_m2} — ABNORMAL LOW (ref >=60)
Globulin: 2.6 g/dL (calc) (ref 1.9–3.7)
Glucose, Bld: 125 mg/dL — ABNORMAL HIGH (ref 65–99)
Potassium: 5.5 mmol/L — ABNORMAL HIGH (ref 3.5–5.3)
Sodium: 142 mmol/L (ref 135–146)
Total Bilirubin: 0.3 mg/dL (ref 0.2–1.2)
Total Protein: 6.4 g/dL (ref 6.1–8.1)

## 2017-04-07 LAB — CBC WITH DIFFERENTIAL/PLATELET
Basophils Absolute: 22 {cells}/uL (ref 0–200)
Basophils Relative: 0.3 %
Eosinophils Absolute: 190 {cells}/uL (ref 15–500)
Eosinophils Relative: 2.6 %
HCT: 27.6 % — ABNORMAL LOW (ref 35.0–45.0)
Hemoglobin: 9.2 g/dL — ABNORMAL LOW (ref 11.7–15.5)
Lymphs Abs: 1212 {cells}/uL (ref 850–3900)
MCH: 29.9 pg (ref 27.0–33.0)
MCHC: 33.3 g/dL (ref 32.0–36.0)
MCV: 89.6 fL (ref 80.0–100.0)
MPV: 10.9 fL (ref 7.5–12.5)
Monocytes Relative: 6.6 %
Neutro Abs: 5395 {cells}/uL (ref 1500–7800)
Neutrophils Relative %: 73.9 %
Platelets: 169 Thousand/uL (ref 140–400)
RBC: 3.08 Million/uL — ABNORMAL LOW (ref 3.80–5.10)
RDW: 13.1 % (ref 11.0–15.0)
Total Lymphocyte: 16.6 %
WBC mixed population: 482 {cells}/uL (ref 200–950)
WBC: 7.3 Thousand/uL (ref 3.8–10.8)

## 2017-04-07 NOTE — Progress Notes (Signed)
Subjective:    Patient ID: [name], female    DOB: October 09, 1950, 66 y.o.   MRN: [id number]  HPI  Patient was recently admitted to the hospital under the care of her urologist for resection of renal cell carcinoma. 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 Right renal mass 03/10/2017  . Diabetic ulcer of right foot associated with type 2 diabetes mellitus ([place])   . Type 2 diabetes, controlled, with neuropathy ([place]) 06/22/2013  . Ulcer of other part of foot 06/22/2013    History and Physical: For full details, please see admission history and physical. Briefly, MAELY CLEMENTS is a 66 y.o. year old patient with History of chronic kidney disease with a preoperative creatinine of approximately 2 was found have a 9-1/2 cm right renal mass and left adrenal mass concerning for a isolated solitary metastasis. She underwent a right radical adrenal sparing robotic nephrectomy and a robotic left adrenalectomy. She is midline hospital for routine postoperative care.   Hospital Course: Patient tolerated the procedure well.  She was then transferred to the floor after an uneventful PACU stay.  Her hospital course was uncomplicated.  On POD#4 she had met discharge criteria: was eating a regular diet, was up and ambulating independently,  pain was well controlled, was voiding without a catheter, and was ready to for discharge.   In regards to her renal function, her creatinine did slowly rise to a maximal level of 3.9. On the date of discharge it did decrease to 3.8. Potassium is stable at 5.0. She made over 1 L of urine  output in 24 hours preceding discharge. She will be discharged and has been instructed to follow up with her nephrologist early next week as she may need modifications to her medications for the long-term. At this time, does not appear that she needs any urgent or emergent dialysis due to her urine output, renal function, and stable potassium.  The patient did have issues with ambulation and was not felt to be strong enough to go directly home. She will be discharged to subacute nursing facility for rehabilitation. The ultimate goal will be discharged home from that facility once her strength has been recovered.  Laboratory values:   Recent Labs (last 2 labs)    Recent Labs  03/12/17 1030 03/13/17 0519  WBC 9.3 7.9  HGB 8.6* 8.1*  HCT 26.9* 24.2*      Recent Labs (last 2 labs)    Recent Labs  03/12/17 0538 03/13/17 0519 03/14/17 0452  NA 138 139 137  K 5.5* 4.6 5.0  CL 110 111 111  CO2 19* 19* 21*  GLUCOSE 110* 126* 171*  BUN 45* 52* 56*  CREATININE 3.61* 3.92* 3.83*  CALCIUM 7.7* 7.8* 8.2*             STOP taking these medications            losartan 100 MG tablet Commonly known as:  COZAAR               TAKE these medications            albuterol 108 (  90 Base) MCG/ACT inhaler Commonly known as:  PROVENTIL HFA;VENTOLIN HFA Inhale 2 puffs into the lungs every 6 (six) hours as needed for wheezing or shortness of breath.    amLODipine 10 MG tablet Commonly known as:  NORVASC Take 1 tablet (10 mg total) by mouth daily. What changed:  when to take this    BREO ELLIPTA 100-25 MCG/INH Aepb Generic drug:  fluticasone furoate-vilanterol INHALE ONE PUFF BY MOUTH ONCE DAILY    gabapentin 300 MG capsule Commonly known as:  NEURONTIN Take 1 capsule (300 mg total) by mouth at bedtime. What changed:  how much to take  when to take this  additional instructions    HYDROcodone-acetaminophen 5-325 MG tablet Commonly known as:  NORCO/VICODIN Take 1-2  tablets by mouth every 4 (four) hours as needed for moderate pain.    liraglutide 18 MG/3ML Sopn 0.6 mg sq qd x 1 week then 1.'2mg'$  sq qd What changed:  how much to take  how to take this  when to take this  additional instructions    PHILLIPS COLON HEALTH PO Take 1 capsule by mouth every evening.    pravastatin 40 MG tablet Commonly known as:  PRAVACHOL Take 1 tablet (40 mg total) by mouth daily. What changed:  when to take this    TRADJENTA 5 MG Tabs tablet Generic drug:  linagliptin TAKE 1 TABLET BY MOUTH ONCE DAILY    TRESIBA FLEXTOUCH 100 UNIT/ML Sopn FlexTouch Pen Generic drug:  insulin degludec INJECT 150 UNITS SUBCUTANEOUSLY ONCE DAILY    vitamin A 10000 UNIT capsule Take 10,000 Units by mouth daily.     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  Preoperative hemoglobin was 11.3 and was down to 8.1 at discharge.  Patient denies any bleeding or bruising. She is uncertain of whether or not anyone has repeated her hemoglobin since discharge. She is not currently taking any iron supplements.  HgA1c was 8.3 in 8/18. Patient continues to take victoza.  However, she independently decrease [place] to 125 units a day.  She denies any hypoglycemic spells. She did this she unilaterally without symptoms. She randomly checks her sugars. She has not seen a single sugar below 100 or a single sugar above 200. She is scheduled to follow-up with her urologist in October. I imagine, will need repeat imaging every 6 months initially to monitor for recurrence.  She has stage V chronic kidney disease but at the present time is not hemodialysis dependent. She denies any uremia. She still reports normal urine output. She is due to recheck her CO2 as well as her potassium level. At the present time there are no clinical indications for dialysis. There is no  evidence of fluid overload on exam. She is already met with a nephrologist who recommended seeing her back again in 3 months. We may need to consider having to arrange for erythropoietin if the patient remains anemic despite adequate repletion of iron deficiency.   Past Medical History:  Diagnosis Date  . Arthritis   . Asthma   . CKD stage 3 due to type 2 diabetes mellitus ([place])   . GERD (gastroesophageal reflux disease)   . HLD (hyperlipidemia)   . Hypertension   . Microalbuminuria due to type 2 diabetes mellitus ([place])   . Neuromuscular disorder ([place])    diabetic neuropathy  . Pneumonia    hx of   .  Renal cell cancer ([place])   . 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 ROBOTIC ADRENALECTOMY Left 03/10/2017   Procedure: XI ROBOTIC ADRENALECTOMY;  Surgeon: Nickie Retort, MD;  Location: [facility];  Service: Urology;  Laterality: Left;  . ROBOTIC ASSITED PARTIAL NEPHRECTOMY Right 03/10/2017   Procedure: XI ROBOTIC ASSITED RADICAL NEPHRECTOMY;  Surgeon: Nickie Retort, MD;  Location: [facility];  Service: Urology;  Laterality: Right;  . TOE AMPUTATION     x 2.5   Current Outpatient Prescriptions on File Prior to Visit  Medication Sig Dispense Refill  . albuterol (PROVENTIL HFA;VENTOLIN HFA) 108 (90 Base) MCG/ACT inhaler Inhale 2 puffs into the lungs every 6 (six) hours as needed for wheezing or shortness of breath. 1 Inhaler 4  . amLODipine (NORVASC) 10 MG tablet Take 1 tablet (10 mg total) by mouth daily. (Patient taking differently: Take 10 mg by mouth every evening. ) 90 tablet 3  . BREO ELLIPTA 100-25 MCG/INH AEPB INHALE ONE PUFF BY MOUTH ONCE DAILY 60 each 11  . gabapentin (NEURONTIN) 300 MG capsule Take 1 capsule (300 mg total) by mouth at bedtime. (Patient taking differently: Take 300-600 mg by mouth See admin instructions. AT BEDTIME AS NEEDED FOR HAND PAIN) 30 capsule 2  . liraglutide 18 MG/3ML SOPN 0.6  mg sq qd x 1 week then 1.27m sq qd (Patient taking differently: Inject 1.2 mg into the skin every evening. 0.6 mg sq qd x 1 week then 1.223msq qd) 6 pen 3  . pravastatin (PRAVACHOL) 40 MG tablet Take 1 tablet (40 mg total) by mouth daily. (Patient taking differently: Take 40 mg by mouth every evening. ) 90 tablet 3  . Probiotic Product (PHILLIPS COLON HEALTH PO) Take 1 capsule by mouth every evening.    . TRADJENTA 5 MG TABS tablet TAKE 1 TABLET BY MOUTH ONCE DAILY 30 tablet 2  . TRESIBA FLEXTOUCH 100 UNIT/ML SOPN FlexTouch Pen INJECT 150 UNITS SUBCUTANEOUSLY ONCE DAILY 45 mL 2  . vitamin A 10000 UNIT capsule Take 10,000 Units by mouth daily.     No current facility-administered medications on file prior to visit.    Allergies  Allergen Reactions  . Penicillins Other (See Comments)    From childhood   Social History   Social History  . Marital status: Single    Spouse name: N/A  . Number of children: N/A  . Years of education: N/A   Occupational History  . Not on file.   Social History Main Topics  . Smoking status: Never Smoker  . Smokeless tobacco: Never Used  . Alcohol use 0.0 oz/week     Comment: occ   . Drug use: No  . Sexual activity: Not Currently    Partners: Male   Other Topics Concern  . Not on file   Social History Narrative  . No narrative on file     Review of Systems  All other systems reviewed and are negative.      Objective:   Physical Exam  Constitutional: She appears well-developed and well-nourished. No distress.  Cardiovascular: Normal rate, regular rhythm and normal heart sounds.   Pulmonary/Chest: Effort normal and breath sounds normal. No respiratory distress. She has no wheezes. She has no rales.  Abdominal: Soft. Bowel sounds are normal. She exhibits no distension. There is no tenderness. There is no rebound and no guarding.  Musculoskeletal: She exhibits edema.  Skin: She is not  diaphoretic.  Vitals reviewed.         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, not on chronic dialysis ([place]) - Plan: COMPLETE METABOLIC PANEL WITH GFR, CBC with Differential/Platelet, CANCELED: Anemia panel  Hospital discharge follow-up - Plan: COMPLETE METABOLIC PANEL WITH GFR, CBC with Differential/Platelet  Diabetes mellitus, type II, insulin dependent ([place]) - Plan: COMPLETE METABOLIC PANEL WITH GFR, CBC with Differential/Platelet  Need for immunization against influenza - Plan: Flu Vaccine QUAD 36+ mos IM  Problem #1, regarding her clear cell carcinoma of the kidney with isolated adrenal metastasis, the patient has successfully undergone surgical resection with clear margins. Clinically she is "cured" at the present time. She follows up with her urologist in October. I anticipate that she will require CT scans without contrast given her precarious renal function every 6 months to monitor for recurrence. At the present time there is no indication for chemotherapy.  Problem #2 chronic kidney disease. Patient is already established with a nephrologist. He will be seeing her every 3 months. At the present time there is no indication for dialysis although I will repeat a BMP to monitor her bicarbonate, potassium. I will also recheck her hemoglobin to determine if she requires iron replacement versus erythropoietin for her anemia.   Problem #3 postoperative anemia. Hemoglobin declined 3. soft or surgery. Repeat CBC today. If hemoglobin is back to baseline, we'll monitor along every 3 months. If the patient is still anemic, I will check  an iron level as well as a percent saturation. If iron level is normal and percent saturation is elevated, the patient may require EPO from renal.   Problem #4, insulin-dependent diabetes. I explained to the patient that she cannot skip meals and insulin. She runs the risk of hypoglycemia. I have asked her to check her fasting blood sugar and two-hour postprandial sugars and record the values and report them to me in one week. I will tailor her dose of Triseba to try to achieve blood sugars between 102 100. I believe we can maintain this range, this will provide a margin of safety for the patient and also prevent worsening of her renal function and long-term morbidity mortality.

## 2017-04-09 ENCOUNTER — Telehealth: Payer: Self-pay

## 2017-04-09 NOTE — Telephone Encounter (Signed)
Spoke with [name] with [facility]. Patient was discharged from hospital and orders are needed for patient. Orders for Home health nursing, PT, OT, and Home health Aid  Please [name]

## 2017-04-10 NOTE — Telephone Encounter (Signed)
Ok to order.  Can give V.O.

## 2017-04-10 NOTE — Telephone Encounter (Signed)
Tomasita Crumble nurse manager will call back so that we may give V.O.

## 2017-04-16 ENCOUNTER — Other Ambulatory Visit: Payer: Self-pay | Admitting: Family Medicine

## 2017-04-16 DIAGNOSIS — E119 Type 2 diabetes mellitus without complications: Secondary | ICD-10-CM

## 2017-04-16 DIAGNOSIS — D631 Anemia in chronic kidney disease: Secondary | ICD-10-CM

## 2017-04-16 DIAGNOSIS — Z794 Long term (current) use of insulin: Secondary | ICD-10-CM

## 2017-04-16 DIAGNOSIS — N185 Chronic kidney disease, stage 5: Secondary | ICD-10-CM

## 2017-04-21 DIAGNOSIS — C641 Malignant neoplasm of right kidney, except renal pelvis: Secondary | ICD-10-CM | POA: Diagnosis not present

## 2017-05-05 ENCOUNTER — Other Ambulatory Visit: Payer: Self-pay | Admitting: Family Medicine

## 2017-05-12 ENCOUNTER — Other Ambulatory Visit: Payer: Self-pay | Admitting: Family Medicine

## 2017-05-12 DIAGNOSIS — I10 Essential (primary) hypertension: Secondary | ICD-10-CM

## 2017-05-19 ENCOUNTER — Other Ambulatory Visit: Payer: Self-pay | Admitting: Family Medicine

## 2017-05-19 MED ORDER — ONETOUCH ULTRA 2 W/DEVICE KIT: PACK | 1 refills | Status: AC

## 2017-05-19 MED ORDER — ONETOUCH ULTRASOFT LANCETS MISC: 3 refills | Status: AC

## 2017-05-19 MED ORDER — GLUCOSE BLOOD VI STRP: ORAL_STRIP | 3 refills | Status: DC

## 2017-05-21 ENCOUNTER — Other Ambulatory Visit: Payer: Medicare Other

## 2017-05-21 DIAGNOSIS — N185 Chronic kidney disease, stage 5: Secondary | ICD-10-CM | POA: Diagnosis not present

## 2017-05-21 DIAGNOSIS — Z794 Long term (current) use of insulin: Secondary | ICD-10-CM | POA: Diagnosis not present

## 2017-05-21 DIAGNOSIS — D631 Anemia in chronic kidney disease: Secondary | ICD-10-CM | POA: Diagnosis not present

## 2017-05-21 DIAGNOSIS — E119 Type 2 diabetes mellitus without complications: Secondary | ICD-10-CM

## 2017-05-21 LAB — COMPREHENSIVE METABOLIC PANEL
AG Ratio: 1.3 (calc) (ref 1.0–2.5)
ALT: 7 U/L (ref 6–29)
AST: 8 U/L — ABNORMAL LOW (ref 10–35)
Albumin: 3.7 g/dL (ref 3.6–5.1)
Alkaline phosphatase (APISO): 88 U/L (ref 33–130)
BUN/Creatinine Ratio: 14 (calc) (ref 6–22)
BUN: 46 mg/dL — ABNORMAL HIGH (ref 7–25)
CO2: 22 mmol/L (ref 20–32)
Calcium: 8.3 mg/dL — ABNORMAL LOW (ref 8.6–10.4)
Chloride: 109 mmol/L (ref 98–110)
Creat: 3.3 mg/dL — ABNORMAL HIGH (ref 0.50–0.99)
Globulin: 2.8 g/dL (calc) (ref 1.9–3.7)
Glucose, Bld: 153 mg/dL — ABNORMAL HIGH (ref 65–99)
Potassium: 5.7 mmol/L — ABNORMAL HIGH (ref 3.5–5.3)
Sodium: 141 mmol/L (ref 135–146)
Total Bilirubin: 0.3 mg/dL (ref 0.2–1.2)
Total Protein: 6.5 g/dL (ref 6.1–8.1)

## 2017-05-21 LAB — CBC WITH DIFFERENTIAL/PLATELET
Basophils Absolute: 19 cells/uL (ref 0–200)
Basophils Relative: 0.3 %
Eosinophils Absolute: 179 cells/uL (ref 15–500)
Eosinophils Relative: 2.8 %
HCT: 29.6 % — ABNORMAL LOW (ref 35.0–45.0)
Hemoglobin: 10 g/dL — ABNORMAL LOW (ref 11.7–15.5)
Lymphs Abs: 1530 cells/uL (ref 850–3900)
MCH: 29.2 pg (ref 27.0–33.0)
MCHC: 33.8 g/dL (ref 32.0–36.0)
MCV: 86.5 fL (ref 80.0–100.0)
MPV: 10.1 fL (ref 7.5–12.5)
Monocytes Relative: 7.2 %
Neutro Abs: 4211 cells/uL (ref 1500–7800)
Neutrophils Relative %: 65.8 %
Platelets: 189 10*3/uL (ref 140–400)
RBC: 3.42 10*6/uL — ABNORMAL LOW (ref 3.80–5.10)
RDW: 13 % (ref 11.0–15.0)
Total Lymphocyte: 23.9 %
WBC mixed population: 461 cells/uL (ref 200–950)
WBC: 6.4 10*3/uL (ref 3.8–10.8)

## 2017-05-28 ENCOUNTER — Other Ambulatory Visit: Payer: Self-pay | Admitting: Family Medicine

## 2017-05-28 ENCOUNTER — Telehealth: Payer: Self-pay | Admitting: Family Medicine

## 2017-05-28 DIAGNOSIS — E875 Hyperkalemia: Secondary | ICD-10-CM

## 2017-05-28 MED ORDER — FUROSEMIDE 20 MG PO TABS: 20.0000 mg | ORAL_TABLET | Freq: Every day | ORAL | 3 refills | Status: DC

## 2017-05-28 NOTE — Telephone Encounter (Signed)
Lattie Haw from [facility] calling to report swelling in patients legs and also to ask what her creatin level was on 05/21/2017 Please call her at [phone] ext [phone]

## 2017-05-29 NOTE — Telephone Encounter (Signed)
Spoke to [place] and informed of information and we put her on Lasix with a recheck in 1 week and pt is aware

## 2017-06-04 ENCOUNTER — Other Ambulatory Visit: Payer: Medicare Other

## 2017-06-04 DIAGNOSIS — E875 Hyperkalemia: Secondary | ICD-10-CM | POA: Diagnosis not present

## 2017-06-04 LAB — BASIC METABOLIC PANEL
BUN/Creatinine Ratio: 16 (calc) (ref 6–22)
BUN: 52 mg/dL — ABNORMAL HIGH (ref 7–25)
CO2: 25 mmol/L (ref 20–32)
Calcium: 8.7 mg/dL (ref 8.6–10.4)
Chloride: 107 mmol/L (ref 98–110)
Creat: 3.2 mg/dL — ABNORMAL HIGH (ref 0.50–0.99)
Glucose, Bld: 204 mg/dL — ABNORMAL HIGH (ref 65–99)
Potassium: 5.8 mmol/L — ABNORMAL HIGH (ref 3.5–5.3)
Sodium: 141 mmol/L (ref 135–146)

## 2017-06-10 ENCOUNTER — Other Ambulatory Visit: Payer: Self-pay

## 2017-06-10 DIAGNOSIS — N185 Chronic kidney disease, stage 5: Secondary | ICD-10-CM

## 2017-06-10 DIAGNOSIS — E0822 Diabetes mellitus due to underlying condition with diabetic chronic kidney disease: Secondary | ICD-10-CM

## 2017-06-13 DIAGNOSIS — N183 Chronic kidney disease, stage 3 (moderate): Secondary | ICD-10-CM | POA: Diagnosis not present

## 2017-07-06 ENCOUNTER — Other Ambulatory Visit: Payer: Self-pay | Admitting: Family Medicine

## 2017-07-07 ENCOUNTER — Ambulatory Visit (INDEPENDENT_AMBULATORY_CARE_PROVIDER_SITE_OTHER): Payer: Medicare Other | Admitting: Family Medicine

## 2017-07-07 ENCOUNTER — Encounter: Payer: Self-pay | Admitting: Family Medicine

## 2017-07-07 VITALS — BP 118/74 | HR 82 | Temp 98.2°F | Resp 18 | Ht 67.0 in | Wt 248.0 lb

## 2017-07-07 DIAGNOSIS — Z23 Encounter for immunization: Secondary | ICD-10-CM

## 2017-07-07 DIAGNOSIS — Z794 Long term (current) use of insulin: Secondary | ICD-10-CM | POA: Diagnosis not present

## 2017-07-07 DIAGNOSIS — C641 Malignant neoplasm of right kidney, except renal pelvis: Secondary | ICD-10-CM

## 2017-07-07 DIAGNOSIS — E119 Type 2 diabetes mellitus without complications: Secondary | ICD-10-CM | POA: Diagnosis not present

## 2017-07-07 DIAGNOSIS — D631 Anemia in chronic kidney disease: Secondary | ICD-10-CM

## 2017-07-07 DIAGNOSIS — N185 Chronic kidney disease, stage 5: Secondary | ICD-10-CM | POA: Diagnosis not present

## 2017-07-07 MED ORDER — GABAPENTIN 300 MG PO CAPS: 300.0000 mg | ORAL_CAPSULE | Freq: Three times a day (TID) | ORAL | 2 refills | Status: DC

## 2017-07-07 NOTE — Progress Notes (Signed)
Subjective:    Patient ID: [name], female    DOB: 11-Mar-1951, 66 y.o.   MRN: [id number]  HPI  Patient was recently admitted to the hospital under the care of her urologist for resection of renal cell carcinoma. 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 Right renal mass 03/10/2017  . Diabetic ulcer of right foot associated with type 2 diabetes mellitus ([place])   . Type 2 diabetes, controlled, with neuropathy ([place]) 06/22/2013  . Ulcer of other part of foot 06/22/2013    History and Physical: For full details, please see admission history and physical. Briefly, [name] is a 66 y.o. year old patient with History of chronic kidney disease with a preoperative creatinine of approximately 2 was found have a 9-1/2 cm right renal mass and left adrenal mass concerning for a isolated solitary metastasis. She underwent a right radical adrenal sparing robotic nephrectomy and a robotic left adrenalectomy. She is midline hospital for routine postoperative care.   Hospital Course: Patient tolerated the procedure well.  She was then transferred to the floor after an uneventful PACU stay.  Her hospital course was uncomplicated.  On POD#4 she had met discharge criteria: was eating a regular diet, was up and ambulating independently,  pain was well controlled, was voiding without a catheter, and was ready to for discharge.   In regards to her renal function, her creatinine did slowly rise to a maximal level of 3.9. On the date of discharge it did decrease to 3.8. Potassium is stable at 5.0. She made over 1 L of urine  output in 24 hours preceding discharge. She will be discharged and has been instructed to follow up with her nephrologist early next week as she may need modifications to her medications for the long-term. At this time, does not appear that she needs any urgent or emergent dialysis due to her urine output, renal function, and stable potassium.  The patient did have issues with ambulation and was not felt to be strong enough to go directly home. She will be discharged to subacute nursing facility for rehabilitation. The ultimate goal will be discharged home from that facility once her strength has been recovered.  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  Preoperative hemoglobin was 11.3 and was down to 8.1 at discharge.  Patient denies any bleeding or bruising. She is uncertain of whether or not anyone has repeated her hemoglobin since discharge. She is not currently taking any iron supplements.  HgA1c was 8.3 in 8/18. Patient continues to take victoza.  However, she independently decrease [place] to 125 units a day.  She denies any hypoglycemic spells. She did this she unilaterally without symptoms. She randomly checks her sugars. She has not seen a single sugar below 100 or a single sugar above 200. She is scheduled to follow-up with her urologist in October. I imagine, will need repeat imaging every 6 months initially to monitor for recurrence.  She has stage V chronic kidney disease but  at the present time is not hemodialysis dependent. She denies any uremia. She still reports normal urine output. She is due to recheck her CO2 as well as her potassium level. At the present time there are no clinical indications for dialysis. There is no evidence of fluid overload on exam. She is already met with a nephrologist who recommended seeing her back again in 3 months. We  may need to consider having to arrange for erythropoietin if the patient remains anemic despite adequate repletion of iron deficiency.  At that time, my plan was: Problem #1, regarding her clear cell carcinoma of the kidney with isolated adrenal metastasis, the patient has successfully undergone surgical resection with clear margins. Clinically she is "cured" at the present time. She follows up with her urologist in October. I anticipate that she will require CT scans without contrast given her precarious renal function every 6 months to monitor for recurrence. At the present time there is no indication for chemotherapy.  Problem #2 chronic kidney disease. Patient is already established with a nephrologist. He will be seeing her every 3 months. At the present time there is no indication for dialysis although I will repeat a BMP to monitor her bicarbonate, potassium. I will also recheck her hemoglobin to determine if she requires iron replacement versus erythropoietin for her anemia.   Problem #3 postoperative anemia. Hemoglobin declined 3. soft or surgery. Repeat CBC today. If hemoglobin is back to baseline, we'll monitor along every 3 months. If the patient is still anemic, I will check an iron level as well as a percent saturation. If iron level is normal and percent saturation is elevated, the patient may require EPO from renal.   Problem #4, insulin-dependent diabetes. I explained to the patient that she cannot skip meals and insulin. She runs the risk of hypoglycemia. I have asked her to check her fasting blood sugar and two-hour postprandial sugars and record the values and report them to me in one week. I will tailor her dose of Triseba to try to achieve blood sugars between 100-200. I believe we can maintain this range, this will provide a margin of safety for the patient and also prevent worsening of her renal function and long-term morbidity mortality.  07/07/17 Here for follow up.  Potassium  remained persistently elevated 5.5-5.8 despite trying lasix.    Hgb remained between 9.2 and 10.  She has an appointment to see Dr. Justin Mend December 28.  However she has been eating potassium containing foods such as oranges and bananas recently.  She is also been eating raisins.  She had a do this because she was snowed in her home and had limited food options.  Regarding her blood sugars, they sound relatively well controlled although she is not checking them frequently.  Her fasting blood sugars are ranging between 90 and 150 with the vast majority between 90 and 110.  Her 2-hour postprandial sugars are averaging between 150 and 200 with the majority being less than 180.  She seldom sees anything greater than 200 according to the patient.  She has a markedly abnormal diabetic foot exam and she is not wearing diabetic shoes.  She has not followed up with her podiatrist in some time.  Today she is wearing slip on shoes with wet socks.  She has had Pneumovax 23.  She is due for Prevnar 13.  Her flu shot is up-to-date.  She denies any chest pain shortness of breath or dyspnea on exertion.  She denies  any hypoglycemic episodes.  She denies any polyuria, polydipsia, or blurry vision.  She is scheduled to follow-up with her urologist in February for repeat to assess for recurrence of her cancer Past Medical History:  Diagnosis Date  . Arthritis   . Asthma   . CKD stage 3 due to type 2 diabetes mellitus ([place])   . GERD (gastroesophageal reflux disease)   . HLD (hyperlipidemia)   . Hypertension   . Microalbuminuria due to type 2 diabetes mellitus ([place])   . Neuromuscular disorder ([place])    diabetic neuropathy  . Pneumonia    hx of   . Renal cell cancer ([place])   . 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 ROBOTIC ADRENALECTOMY Left 03/10/2017   Procedure: XI ROBOTIC ADRENALECTOMY;  Surgeon: Nickie Retort, MD;  Location: [facility];   Service: Urology;  Laterality: Left;  . ROBOTIC ASSITED PARTIAL NEPHRECTOMY Right 03/10/2017   Procedure: XI ROBOTIC ASSITED RADICAL NEPHRECTOMY;  Surgeon: Nickie Retort, MD;  Location: [facility];  Service: Urology;  Laterality: Right;  . TOE AMPUTATION     x 2.5   Current Outpatient Medications on File Prior to Visit  Medication Sig Dispense Refill  . amLODipine (NORVASC) 10 MG tablet TAKE ONE TABLET BY MOUTH ONCE DAILY 90 tablet 3  . Blood Glucose Monitoring Suppl (ONE TOUCH ULTRA 2) w/Device KIT Use to check BS BID-QID Dx:E11.9 1 each 1  . BREO ELLIPTA 100-25 MCG/INH AEPB INHALE ONE PUFF BY MOUTH ONCE DAILY 60 each 11  . furosemide (LASIX) 20 MG tablet Take 1 tablet (20 mg total) daily by mouth. 30 tablet 3  . gabapentin (NEURONTIN) 300 MG capsule TAKE 1 CAPSULE BY MOUTH AT BEDTIME 30 capsule 2  . glucose blood (ONE TOUCH ULTRA TEST) test strip Use as instructed 300 each 3  . Lancets (ONETOUCH ULTRASOFT) lancets Use as instructed 300 each 3  . liraglutide (VICTOZA) 18 MG/3ML SOPN Inject SQ 1.2 MG ONCE A DAY 6 pen 1  . liraglutide 18 MG/3ML SOPN 0.6 mg sq qd x 1 week then 1.48m sq qd (Patient taking differently: Inject 1.2 mg into the skin every evening. 0.6 mg sq qd x 1 week then 1.232msq qd) 6 pen 3  . pravastatin (PRAVACHOL) 40 MG tablet TAKE ONE TABLET BY MOUTH ONCE DAILY 90 tablet 3  . PROAIR HFA 108 (90 Base) MCG/ACT inhaler INHALE TWO PUFFS INTO LUNGS EVERY 6 HOURS AS NEEDED FOR WHEEZING AND FOR SHORTNESS OF BREATH 9 each 4  . Probiotic Product (PHILLIPS COLON HEALTH PO) Take 1 capsule by mouth every evening.    . TRADJENTA 5 MG TABS tablet TAKE 1 TABLET BY MOUTH ONCE DAILY 90 tablet 2  . TRESIBA FLEXTOUCH 100 UNIT/ML SOPN FlexTouch Pen INJECT 150 UNITS SUBCUTANEOUSLY ONCE DAILY 45 mL 2  . vitamin A 10000 UNIT capsule Take 10,000 Units by mouth daily.     No current facility-administered medications on file prior to visit.    Allergies  Allergen Reactions  . Penicillins Other  (See Comments)    From childhood   Social History   Socioeconomic History  . Marital status: Single    Spouse name: Not on file  . Number of children: Not on file  . Years of education: Not on file  . Highest education level: Not on file  Social Needs  . Financial resource strain: Not on file  . Food insecurity -  worry: Not on file  . Food insecurity - inability: Not on file  . Transportation needs - medical: Not on file  . Transportation needs - non-medical: Not on file  Occupational History  . Not on file  Tobacco Use  . Smoking status: Never Smoker  . Smokeless tobacco: Never Used  Substance and Sexual Activity  . Alcohol use: Yes    Alcohol/week: 0.0 oz    Comment: occ   . Drug use: No  . Sexual activity: Not Currently    Partners: Male  Other Topics Concern  . Not on file  Social History Narrative  . Not on file     Review of Systems  All other systems reviewed and are negative.      Objective:   Physical Exam  Constitutional: She appears well-developed and well-nourished. No distress.  Cardiovascular: Normal rate, regular rhythm and normal heart sounds.  Pulmonary/Chest: Effort normal and breath sounds normal. No respiratory distress. She has no wheezes. She has no rales.  Abdominal: Soft. Bowel sounds are normal. She exhibits no distension. There is no tenderness. There is no rebound and no guarding.  Musculoskeletal: She exhibits edema.  Skin: She is not diaphoretic.  Vitals reviewed.         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  I will make no changes in her antihypertensive medication.  Regarding her diabetes, her reported sugars sound adequately controlled.  I will check a hemoglobin A1c.  I would be willing to accept a hemoglobin A1c 7.5 or less for this patient.  I discouraged the patient from wearing her slip on shoes.  I recommended wearing properly fitting diabetic shoes to offload pressure from her toes to prevent future pressure sores.  Her flu shot is up-to-date.  She has had Pneumovax 23.  I will give her Prevnar 13 today.  I will check a fasting lipid panel.  Her goal LDL cholesterol is less than 100.  Regarding her chronic kidney disease, I will monitor her creatinine along with her potassium and bicarbonate.  If her potassium is greater than 6, she will require Kayexalate.  She has planned follow-up with her nephrologist later this month.  Regarding her anemia, I will recheck a CBC.  If less than 10, she may need to discuss erythropoietin with her nephrologist as well.

## 2017-07-08 LAB — LIPID PANEL
Cholesterol: 182 mg/dL (ref <200)
HDL: 37 mg/dL — ABNORMAL LOW (ref >50)
LDL Cholesterol (Calc): 109 mg/dL (calc) — ABNORMAL HIGH
Non-HDL Cholesterol (Calc): 145 mg/dL (calc) — ABNORMAL HIGH (ref <130)
Total CHOL/HDL Ratio: 4.9 (calc) (ref <5.0)
Triglycerides: 238 mg/dL — ABNORMAL HIGH (ref <150)

## 2017-07-08 LAB — COMPLETE METABOLIC PANEL WITH GFR
AG Ratio: 1.3 (calc) (ref 1.0–2.5)
ALT: 9 U/L (ref 6–29)
AST: 11 U/L (ref 10–35)
Albumin: 3.8 g/dL (ref 3.6–5.1)
Alkaline phosphatase (APISO): 101 U/L (ref 33–130)
BUN/Creatinine Ratio: 15 (calc) (ref 6–22)
BUN: 50 mg/dL — ABNORMAL HIGH (ref 7–25)
CO2: 27 mmol/L (ref 20–32)
Calcium: 9.3 mg/dL (ref 8.6–10.4)
Chloride: 103 mmol/L (ref 98–110)
Creat: 3.28 mg/dL — ABNORMAL HIGH (ref 0.50–0.99)
GFR, Est African American: 16 mL/min/{1.73_m2} — ABNORMAL LOW (ref >=60)
GFR, Est Non African American: 14 mL/min/{1.73_m2} — ABNORMAL LOW (ref >=60)
Globulin: 2.9 g/dL (calc) (ref 1.9–3.7)
Glucose, Bld: 85 mg/dL (ref 65–99)
Potassium: 5.1 mmol/L (ref 3.5–5.3)
Sodium: 140 mmol/L (ref 135–146)
Total Bilirubin: 0.4 mg/dL (ref 0.2–1.2)
Total Protein: 6.7 g/dL (ref 6.1–8.1)

## 2017-07-08 LAB — CBC WITH DIFFERENTIAL/PLATELET
Basophils Absolute: 41 cells/uL (ref 0–200)
Basophils Relative: 0.6 %
Eosinophils Absolute: 88 cells/uL (ref 15–500)
Eosinophils Relative: 1.3 %
HCT: 31.2 % — ABNORMAL LOW (ref 35.0–45.0)
Hemoglobin: 10.6 g/dL — ABNORMAL LOW (ref 11.7–15.5)
Lymphs Abs: 1639 cells/uL (ref 850–3900)
MCH: 29.4 pg (ref 27.0–33.0)
MCHC: 34 g/dL (ref 32.0–36.0)
MCV: 86.7 fL (ref 80.0–100.0)
MPV: 10.8 fL (ref 7.5–12.5)
Monocytes Relative: 7 %
Neutro Abs: 4556 cells/uL (ref 1500–7800)
Neutrophils Relative %: 67 %
Platelets: 195 10*3/uL (ref 140–400)
RBC: 3.6 10*6/uL — ABNORMAL LOW (ref 3.80–5.10)
RDW: 13.4 % (ref 11.0–15.0)
Total Lymphocyte: 24.1 %
WBC mixed population: 476 cells/uL (ref 200–950)
WBC: 6.8 10*3/uL (ref 3.8–10.8)

## 2017-07-08 LAB — HEMOGLOBIN A1C
Hgb A1c MFr Bld: 7.5 % of total Hgb — ABNORMAL HIGH (ref <5.7)
Mean Plasma Glucose: 169 (calc)
eAG (mmol/L): 9.3 (calc)

## 2017-07-08 LAB — MICROALBUMIN, URINE: Microalb, Ur: 97.9 mg/dL

## 2017-07-08 NOTE — Addendum Note (Signed)
Addended by: Shary Decamp B on: 07/08/2017 08:48 AM   Modules accepted: Orders

## 2017-07-09 ENCOUNTER — Other Ambulatory Visit: Payer: Self-pay | Admitting: Family Medicine

## 2017-07-09 MED ORDER — ATORVASTATIN CALCIUM 40 MG PO TABS: 40.0000 mg | ORAL_TABLET | Freq: Every day | ORAL | 1 refills | Status: DC

## 2017-07-18 DIAGNOSIS — N2581 Secondary hyperparathyroidism of renal origin: Secondary | ICD-10-CM | POA: Diagnosis not present

## 2017-07-18 DIAGNOSIS — E1122 Type 2 diabetes mellitus with diabetic chronic kidney disease: Secondary | ICD-10-CM | POA: Diagnosis not present

## 2017-07-18 DIAGNOSIS — D631 Anemia in chronic kidney disease: Secondary | ICD-10-CM | POA: Diagnosis not present

## 2017-07-18 DIAGNOSIS — N183 Chronic kidney disease, stage 3 (moderate): Secondary | ICD-10-CM | POA: Diagnosis not present

## 2017-07-28 ENCOUNTER — Encounter: Payer: Self-pay | Admitting: Surgery

## 2017-07-28 ENCOUNTER — Encounter: Payer: Self-pay | Admitting: *Deleted

## 2017-07-28 ENCOUNTER — Ambulatory Visit (HOSPITAL_COMMUNITY)
Admission: RE | Admit: 2017-07-28 | Discharge: 2017-07-28 | Disposition: A | Discharge status: Home / Self Care | Payer: Medicare Other | Source: Ambulatory Visit | Attending: Surgery | Admitting: Surgery

## 2017-07-28 ENCOUNTER — Other Ambulatory Visit: Payer: Self-pay | Admitting: *Deleted

## 2017-07-28 ENCOUNTER — Other Ambulatory Visit: Payer: Self-pay

## 2017-07-28 ENCOUNTER — Ambulatory Visit (INDEPENDENT_AMBULATORY_CARE_PROVIDER_SITE_OTHER)
Admission: RE | Admit: 2017-07-28 | Discharge: 2017-07-28 | Disposition: A | Discharge status: Home / Self Care | Payer: Medicare Other | Source: Ambulatory Visit | Attending: Surgery | Admitting: Surgery

## 2017-07-28 ENCOUNTER — Ambulatory Visit (INDEPENDENT_AMBULATORY_CARE_PROVIDER_SITE_OTHER): Payer: Medicare Other | Admitting: Surgery

## 2017-07-28 VITALS — BP 143/83 | HR 72 | Temp 96.9°F | Resp 16 | Ht 67.0 in | Wt 250.0 lb

## 2017-07-28 DIAGNOSIS — N185 Chronic kidney disease, stage 5: Secondary | ICD-10-CM

## 2017-07-28 DIAGNOSIS — E0822 Diabetes mellitus due to underlying condition with diabetic chronic kidney disease: Secondary | ICD-10-CM | POA: Diagnosis not present

## 2017-07-28 DIAGNOSIS — E1122 Type 2 diabetes mellitus with diabetic chronic kidney disease: Secondary | ICD-10-CM | POA: Diagnosis not present

## 2017-07-28 DIAGNOSIS — Z01810 Encounter for preprocedural cardiovascular examination: Secondary | ICD-10-CM | POA: Insufficient documentation

## 2017-07-28 DIAGNOSIS — N184 Chronic kidney disease, stage 4 (severe): Secondary | ICD-10-CM

## 2017-07-28 NOTE — Progress Notes (Signed)
[facility] [place]  Patient name: [name] MRN: [id number] DOB: 07-16-1951 Sex: female   REQUESTING PROVIDER:    Dr. Justin Mend    REASON FOR CONSULT:    Dialysis access  HISTORY OF PRESENT ILLNESS:   [name] is a 67 y.o. female, who is referred today for evaluation of dialysis access.  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  She has undergone nephrectomy for renal cell cancer.  She suffers from hyperlipidemia for which she takes a statin.  She is a diabetic.  She has a remote history of smoking.  She suffers from peripheral neuropathy  PAST MEDICAL HISTORY    Past Medical History:  Diagnosis Date  . Arthritis   . Asthma   . CKD stage 3 due to type 2 diabetes mellitus ([name])   . GERD (gastroesophageal reflux disease)   . HLD (hyperlipidemia)   . Hypertension   . Microalbuminuria due to type 2 diabetes mellitus ([place])   . Neuromuscular disorder ([place])    diabetic neuropathy  . Pneumonia    hx of   . Renal cell cancer ([place])   . 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 Heart disease Mother   . Diabetes Sister     SOCIAL HISTORY:   Social History   Socioeconomic History  . Marital status: Single    Spouse name: Not on file  . Number of children: Not on file  . Years of education: Not on file  . Highest education level: Not on file  Social Needs  . Financial resource strain: Not on file  . Food insecurity - worry: Not on file  . Food insecurity - inability: Not on file  . Transportation needs - medical: Not on file  . Transportation needs - non-medical: Not on file  Occupational History  . Not on file  Tobacco Use  . Smoking status: Never Smoker  . Smokeless tobacco: Never Used  Substance and Sexual Activity  . Alcohol use: Yes    Alcohol/week: 0.0 oz    Comment:  occ   . Drug use: No  . Sexual activity: Not Currently    Partners: Male  Other Topics Concern  . Not on file  Social History Narrative  . Not on file    ALLERGIES:    Allergies  Allergen Reactions  . Penicillins Other (See Comments)    From childhood    CURRENT MEDICATIONS:    Current Outpatient Medications  Medication Sig Dispense Refill  . amLODipine (NORVASC) 10 MG tablet TAKE ONE TABLET BY MOUTH ONCE DAILY 90 tablet 3  . atorvastatin (LIPITOR) 40 MG tablet Take 1 tablet (40 mg total) by mouth daily. 90 tablet 1  . Blood Glucose Monitoring Suppl (ONE TOUCH ULTRA 2) w/Device KIT Use to check BS BID-QID Dx:E11.9 1 each 1  . BREO ELLIPTA 100-25 MCG/INH AEPB INHALE ONE PUFF BY MOUTH ONCE DAILY 60 each 11  . furosemide (LASIX) 20 MG tablet Take 1 tablet (20 mg total) daily by mouth. 30 tablet 3  . gabapentin (NEURONTIN) 300 MG capsule Take 1 capsule (300 mg total) by mouth 3 (three) times daily. 90 capsule 2  . glucose blood (ONE TOUCH ULTRA TEST) test strip Use as instructed 300 each 3  . Lancets (ONETOUCH ULTRASOFT) lancets Use as instructed 300 each 3  . liraglutide (VICTOZA)  18 MG/3ML SOPN Inject SQ 1.2 MG ONCE A DAY 6 pen 1  . liraglutide 18 MG/3ML SOPN 0.6 mg sq qd x 1 week then 1.'2mg'$  sq qd (Patient taking differently: Inject 1.2 mg into the skin every evening. 0.6 mg sq qd x 1 week then 1.'2mg'$  sq qd) 6 pen 3  . PROAIR HFA 108 (90 Base) MCG/ACT inhaler INHALE TWO PUFFS INTO LUNGS EVERY 6 HOURS AS NEEDED FOR WHEEZING AND FOR SHORTNESS OF BREATH 9 each 4  . TRADJENTA 5 MG TABS tablet TAKE 1 TABLET BY MOUTH ONCE DAILY 90 tablet 2  . TRESIBA FLEXTOUCH 100 UNIT/ML SOPN FlexTouch Pen INJECT 150 UNITS SUBCUTANEOUSLY ONCE DAILY (Patient taking differently: INJECT 125 UNITS SUBCUTANEOUSLY ONCE DAILY) 45 mL 2  . vitamin A 10000 UNIT capsule Take 10,000 Units by mouth daily.    Marland Kitchen glimepiride (AMARYL) 4 MG tablet Take 4 mg by mouth.    . insulin NPH-regular Human (NOVOLIN 70/30)  (70-30) 100 UNIT/ML injection Inject into the skin.    . metFORMIN (GLUCOPHAGE) 1000 MG tablet Take 1,000 mg by mouth.    . pravastatin (PRAVACHOL) 40 MG tablet     . Probiotic Product (PHILLIPS COLON HEALTH PO) Take 1 capsule by mouth every evening.     No current facility-administered medications for this visit.     REVIEW OF SYSTEMS:   '[X]'$  denotes positive finding, '[ ]'$  denotes negative finding Cardiac  Comments:  Chest pain or chest pressure:    Shortness of breath upon exertion: x   Short of breath when lying flat:    Irregular heart rhythm:        Vascular    Pain in calf, thigh, or hip brought on by ambulation: x   Pain in feet at night that wakes you up from your sleep:  x   Blood clot in your veins:    Leg swelling:  x       Pulmonary    Oxygen at home:    Productive cough:     Wheezing:         Neurologic    Sudden weakness in arms or legs:     Sudden numbness in arms or legs:     Sudden onset of difficulty speaking or slurred speech:    Temporary loss of vision in one eye:     Problems with dizziness:         Gastrointestinal    Blood in stool:      Vomited blood:         Genitourinary    Burning when urinating:     Blood in urine:        Psychiatric    Major depression:         Hematologic    Bleeding problems:    Problems with blood clotting too easily:        Skin    Rashes or ulcers:        Constitutional    Fever or chills:     PHYSICAL EXAM:   Vitals:   07/28/17 0901 07/28/17 0902  BP: (!) 146/81 (!) 143/83  Pulse: 74 72  Resp: 16   Temp: (!) 96.9 F (36.1 C)   TempSrc: Oral   SpO2: 99%   Weight: 250 lb (113.4 kg)   Height: '5\' 7"'$  (1.702 m)     GENERAL: The patient is a well-nourished female, in no acute distress. The vital signs are documented above. CARDIAC: There is a regular rate  and rhythm.  VASCULAR: Palpable radial pulse bilaterally PULMONARY: Nonlabored respirations ABDOMEN: Soft and non-tender with normal pitched  bowel sounds.  MUSCULOSKELETAL: There are no major deformities or cyanosis. NEUROLOGIC: No focal weakness or paresthesias are detected. SKIN: There are no ulcers or rashes noted. PSYCHIATRIC: The patient has a normal affect.  STUDIES:   Have reviewed her vein mapping.  She appears to have an adequate basilic vein beginning just proximal to the antecubital crease on the left.  The left cephalic vein is small.  The right cephalic vein is adequate.  She has normal arterial bifurcation at the antecubital crease  ASSESSMENT and PLAN   Chronic renal insufficiency (stage IV): We discussed proceeding with fistula creation.  Because she is right-handed, I will try to do this in her left arm.  Based on her vein mapping, I feel like she would be a candidate for a stage basilic vein fistula creation.  I will try to get her first stage done in January 2019.  We discussed the risks and benefits of the procedure including the risk of non-maturity and the need for additional operations.  All of her questions were answered.   Annamarie Major, MD [facility] [phone] Pager [phone]

## 2017-07-28 NOTE — H&P (View-Only) (Signed)
[facility] [place]  Patient name: [name] MRN: [id number] DOB: 1950/08/19 Sex: female   REQUESTING PROVIDER:    Dr. Justin Mend    REASON FOR CONSULT:    Dialysis access  HISTORY OF PRESENT ILLNESS:   [name] is a 67 y.o. female, who is referred today for evaluation of dialysis access.  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  She has undergone nephrectomy for renal cell cancer.  She suffers from hyperlipidemia for which she takes a statin.  She is a diabetic.  She has a remote history of smoking.  She suffers from peripheral neuropathy  PAST MEDICAL HISTORY    Past Medical History:  Diagnosis Date  . Arthritis   . Asthma   . CKD stage 3 due to type 2 diabetes mellitus ([place])   . GERD (gastroesophageal reflux disease)   . HLD (hyperlipidemia)   . Hypertension   . Microalbuminuria due to type 2 diabetes mellitus ([place])   . Neuromuscular disorder ([place])    diabetic neuropathy  . Pneumonia    hx of   . Renal cell cancer ([place])   . 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 Heart disease Mother   . Diabetes Sister     SOCIAL HISTORY:   Social History   Socioeconomic History  . Marital status: Single    Spouse name: Not on file  . Number of children: Not on file  . Years of education: Not on file  . Highest education level: Not on file  Social Needs  . Financial resource strain: Not on file  . Food insecurity - worry: Not on file  . Food insecurity - inability: Not on file  . Transportation needs - medical: Not on file  . Transportation needs - non-medical: Not on file  Occupational History  . Not on file  Tobacco Use  . Smoking status: Never Smoker  . Smokeless tobacco: Never Used  Substance and Sexual Activity  . Alcohol use: Yes    Alcohol/week: 0.0 oz    Comment:  occ   . Drug use: No  . Sexual activity: Not Currently    Partners: Male  Other Topics Concern  . Not on file  Social History Narrative  . Not on file    ALLERGIES:    Allergies  Allergen Reactions  . Penicillins Other (See Comments)    From childhood    CURRENT MEDICATIONS:    Current Outpatient Medications  Medication Sig Dispense Refill  . amLODipine (NORVASC) 10 MG tablet TAKE ONE TABLET BY MOUTH ONCE DAILY 90 tablet 3  . atorvastatin (LIPITOR) 40 MG tablet Take 1 tablet (40 mg total) by mouth daily. 90 tablet 1  . Blood Glucose Monitoring Suppl (ONE TOUCH ULTRA 2) w/Device KIT Use to check BS BID-QID Dx:E11.9 1 each 1  . BREO ELLIPTA 100-25 MCG/INH AEPB INHALE ONE PUFF BY MOUTH ONCE DAILY 60 each 11  . furosemide (LASIX) 20 MG tablet Take 1 tablet (20 mg total) daily by mouth. 30 tablet 3  . gabapentin (NEURONTIN) 300 MG capsule Take 1 capsule (300 mg total) by mouth 3 (three) times daily. 90 capsule 2  . glucose blood (ONE TOUCH ULTRA TEST) test strip Use as instructed 300 each 3  . Lancets (ONETOUCH ULTRASOFT) lancets Use as instructed 300 each 3  . liraglutide (VICTOZA)  18 MG/3ML SOPN Inject SQ 1.2 MG ONCE A DAY 6 pen 1  . liraglutide 18 MG/3ML SOPN 0.6 mg sq qd x 1 week then 1.'2mg'$  sq qd (Patient taking differently: Inject 1.2 mg into the skin every evening. 0.6 mg sq qd x 1 week then 1.'2mg'$  sq qd) 6 pen 3  . PROAIR HFA 108 (90 Base) MCG/ACT inhaler INHALE TWO PUFFS INTO LUNGS EVERY 6 HOURS AS NEEDED FOR WHEEZING AND FOR SHORTNESS OF BREATH 9 each 4  . TRADJENTA 5 MG TABS tablet TAKE 1 TABLET BY MOUTH ONCE DAILY 90 tablet 2  . TRESIBA FLEXTOUCH 100 UNIT/ML SOPN FlexTouch Pen INJECT 150 UNITS SUBCUTANEOUSLY ONCE DAILY (Patient taking differently: INJECT 125 UNITS SUBCUTANEOUSLY ONCE DAILY) 45 mL 2  . vitamin A 10000 UNIT capsule Take 10,000 Units by mouth daily.    Marland Kitchen glimepiride (AMARYL) 4 MG tablet Take 4 mg by mouth.    . insulin NPH-regular Human (NOVOLIN 70/30)  (70-30) 100 UNIT/ML injection Inject into the skin.    . metFORMIN (GLUCOPHAGE) 1000 MG tablet Take 1,000 mg by mouth.    . pravastatin (PRAVACHOL) 40 MG tablet     . Probiotic Product (PHILLIPS COLON HEALTH PO) Take 1 capsule by mouth every evening.     No current facility-administered medications for this visit.     REVIEW OF SYSTEMS:   '[X]'$  denotes positive finding, '[ ]'$  denotes negative finding Cardiac  Comments:  Chest pain or chest pressure:    Shortness of breath upon exertion: x   Short of breath when lying flat:    Irregular heart rhythm:        Vascular    Pain in calf, thigh, or hip brought on by ambulation: x   Pain in feet at night that wakes you up from your sleep:  x   Blood clot in your veins:    Leg swelling:  x       Pulmonary    Oxygen at home:    Productive cough:     Wheezing:         Neurologic    Sudden weakness in arms or legs:     Sudden numbness in arms or legs:     Sudden onset of difficulty speaking or slurred speech:    Temporary loss of vision in one eye:     Problems with dizziness:         Gastrointestinal    Blood in stool:      Vomited blood:         Genitourinary    Burning when urinating:     Blood in urine:        Psychiatric    Major depression:         Hematologic    Bleeding problems:    Problems with blood clotting too easily:        Skin    Rashes or ulcers:        Constitutional    Fever or chills:     PHYSICAL EXAM:   Vitals:   07/28/17 0901 07/28/17 0902  BP: (!) 146/81 (!) 143/83  Pulse: 74 72  Resp: 16   Temp: (!) 96.9 F (36.1 C)   TempSrc: Oral   SpO2: 99%   Weight: 250 lb (113.4 kg)   Height: '5\' 7"'$  (1.702 m)     GENERAL: The patient is a well-nourished female, in no acute distress. The vital signs are documented above. CARDIAC: There is a regular rate  and rhythm.  VASCULAR: Palpable radial pulse bilaterally PULMONARY: Nonlabored respirations ABDOMEN: Soft and non-tender with normal pitched  bowel sounds.  MUSCULOSKELETAL: There are no major deformities or cyanosis. NEUROLOGIC: No focal weakness or paresthesias are detected. SKIN: There are no ulcers or rashes noted. PSYCHIATRIC: The patient has a normal affect.  STUDIES:   Have reviewed her vein mapping.  She appears to have an adequate basilic vein beginning just proximal to the antecubital crease on the left.  The left cephalic vein is small.  The right cephalic vein is adequate.  She has normal arterial bifurcation at the antecubital crease  ASSESSMENT and PLAN   Chronic renal insufficiency (stage IV): We discussed proceeding with fistula creation.  Because she is right-handed, I will try to do this in her left arm.  Based on her vein mapping, I feel like she would be a candidate for a stage basilic vein fistula creation.  I will try to get her first stage done in January 2019.  We discussed the risks and benefits of the procedure including the risk of non-maturity and the need for additional operations.  All of her questions were answered.   Annamarie Major, MD [facility] [phone] Pager [phone]

## 2017-08-07 ENCOUNTER — Encounter (HOSPITAL_COMMUNITY): Payer: Self-pay | Admitting: *Deleted

## 2017-08-07 ENCOUNTER — Other Ambulatory Visit: Payer: Self-pay

## 2017-08-08 ENCOUNTER — Encounter (HOSPITAL_COMMUNITY)
Admission: RE | Disposition: A | Discharge status: Home / Self Care | Payer: Self-pay | Source: Ambulatory Visit | Attending: Surgery

## 2017-08-08 ENCOUNTER — Ambulatory Visit (HOSPITAL_COMMUNITY): Payer: Medicare Other | Admitting: Anesthesiology

## 2017-08-08 ENCOUNTER — Ambulatory Visit (HOSPITAL_COMMUNITY)
Admission: RE | Admit: 2017-08-08 | Discharge: 2017-08-08 | Disposition: A | Discharge status: Home / Self Care | Payer: Medicare Other | Source: Ambulatory Visit | Attending: Surgery | Admitting: Surgery

## 2017-08-08 ENCOUNTER — Encounter (HOSPITAL_COMMUNITY): Payer: Self-pay | Admitting: *Deleted

## 2017-08-08 DIAGNOSIS — I129 Hypertensive chronic kidney disease with stage 1 through stage 4 chronic kidney disease, or unspecified chronic kidney disease: Secondary | ICD-10-CM | POA: Insufficient documentation

## 2017-08-08 DIAGNOSIS — J45909 Unspecified asthma, uncomplicated: Secondary | ICD-10-CM | POA: Diagnosis not present

## 2017-08-08 DIAGNOSIS — E1121 Type 2 diabetes mellitus with diabetic nephropathy: Secondary | ICD-10-CM | POA: Diagnosis not present

## 2017-08-08 DIAGNOSIS — Z905 Acquired absence of kidney: Secondary | ICD-10-CM | POA: Insufficient documentation

## 2017-08-08 DIAGNOSIS — Z87891 Personal history of nicotine dependence: Secondary | ICD-10-CM | POA: Insufficient documentation

## 2017-08-08 DIAGNOSIS — Z85528 Personal history of other malignant neoplasm of kidney: Secondary | ICD-10-CM | POA: Insufficient documentation

## 2017-08-08 DIAGNOSIS — N189 Chronic kidney disease, unspecified: Secondary | ICD-10-CM | POA: Diagnosis not present

## 2017-08-08 DIAGNOSIS — E1142 Type 2 diabetes mellitus with diabetic polyneuropathy: Secondary | ICD-10-CM | POA: Diagnosis not present

## 2017-08-08 DIAGNOSIS — Z79899 Other long term (current) drug therapy: Secondary | ICD-10-CM | POA: Diagnosis not present

## 2017-08-08 DIAGNOSIS — D631 Anemia in chronic kidney disease: Secondary | ICD-10-CM | POA: Insufficient documentation

## 2017-08-08 DIAGNOSIS — N2889 Other specified disorders of kidney and ureter: Secondary | ICD-10-CM | POA: Diagnosis not present

## 2017-08-08 DIAGNOSIS — E785 Hyperlipidemia, unspecified: Secondary | ICD-10-CM | POA: Diagnosis not present

## 2017-08-08 DIAGNOSIS — N185 Chronic kidney disease, stage 5: Secondary | ICD-10-CM

## 2017-08-08 DIAGNOSIS — Z794 Long term (current) use of insulin: Secondary | ICD-10-CM | POA: Diagnosis not present

## 2017-08-08 DIAGNOSIS — N184 Chronic kidney disease, stage 4 (severe): Secondary | ICD-10-CM | POA: Insufficient documentation

## 2017-08-08 DIAGNOSIS — E1122 Type 2 diabetes mellitus with diabetic chronic kidney disease: Secondary | ICD-10-CM | POA: Diagnosis not present

## 2017-08-08 HISTORY — DX: Major depressive disorder, single episode, unspecified: F32.9

## 2017-08-08 HISTORY — DX: Depression, unspecified: F32.A

## 2017-08-08 HISTORY — PX: BASCILIC VEIN TRANSPOSITION: SHX5742

## 2017-08-08 HISTORY — DX: Anemia, unspecified: D64.9

## 2017-08-08 LAB — POCT I-STAT 4, (NA,K, GLUC, HGB,HCT)
Glucose, Bld: 182 mg/dL — ABNORMAL HIGH (ref 65–99)
HCT: 27 % — ABNORMAL LOW (ref 36.0–46.0)
Hemoglobin: 9.2 g/dL — ABNORMAL LOW (ref 12.0–15.0)
Potassium: 4.3 mmol/L (ref 3.5–5.1)
Sodium: 143 mmol/L (ref 135–145)

## 2017-08-08 LAB — GLUCOSE, CAPILLARY
Glucose-Capillary: 176 mg/dL — ABNORMAL HIGH (ref 65–99)
Glucose-Capillary: 171 mg/dL — ABNORMAL HIGH (ref 65–99)

## 2017-08-08 SURGERY — TRANSPOSITION, VEIN, BASILIC
Anesthesia: Monitor Anesthesia Care | Site: Arm Upper | Laterality: Left

## 2017-08-08 MED ORDER — LIDOCAINE HCL (CARDIAC) 20 MG/ML IV SOLN
INTRAVENOUS | Status: DC | PRN
  Administered 2017-08-08: 100 mg via INTRAVENOUS

## 2017-08-08 MED ORDER — PROPOFOL 500 MG/50ML IV EMUL
INTRAVENOUS | Status: DC | PRN
  Administered 2017-08-08: 100 ug/kg/min via INTRAVENOUS

## 2017-08-08 MED ORDER — VANCOMYCIN HCL IN DEXTROSE 1-5 GM/200ML-% IV SOLN
1000.0000 mg | INTRAVENOUS | Status: AC
  Administered 2017-08-08: 1000 mg via INTRAVENOUS
  Filled 2017-08-08: qty 200

## 2017-08-08 MED ORDER — 0.9 % SODIUM CHLORIDE (POUR BTL) OPTIME
TOPICAL | Status: DC | PRN
  Administered 2017-08-08: 1000 mL

## 2017-08-08 MED ORDER — FENTANYL CITRATE (PF) 100 MCG/2ML IJ SOLN
INTRAMUSCULAR | Status: DC | PRN
  Administered 2017-08-08: 25 ug via INTRAVENOUS
  Administered 2017-08-08: 50 ug via INTRAVENOUS

## 2017-08-08 MED ORDER — LIDOCAINE-EPINEPHRINE (PF) 1 %-1:200000 IJ SOLN
INTRAMUSCULAR | Status: DC | PRN
  Administered 2017-08-08: 7 mL

## 2017-08-08 MED ORDER — MIDAZOLAM HCL 2 MG/2ML IJ SOLN
INTRAMUSCULAR | Status: AC
  Filled 2017-08-08: qty 2

## 2017-08-08 MED ORDER — LIDOCAINE-EPINEPHRINE (PF) 1 %-1:200000 IJ SOLN
INTRAMUSCULAR | Status: AC
  Filled 2017-08-08: qty 30

## 2017-08-08 MED ORDER — PROMETHAZINE HCL 25 MG/ML IJ SOLN: 6.2500 mg | INTRAMUSCULAR | Status: DC | PRN

## 2017-08-08 MED ORDER — FENTANYL CITRATE (PF) 100 MCG/2ML IJ SOLN: 25.0000 ug | INTRAMUSCULAR | Status: DC | PRN

## 2017-08-08 MED ORDER — SODIUM CHLORIDE 0.9 % IV SOLN: INTRAVENOUS | Status: DC

## 2017-08-08 MED ORDER — MIDAZOLAM HCL 2 MG/2ML IJ SOLN
INTRAMUSCULAR | Status: DC | PRN
  Administered 2017-08-08: 2 mg via INTRAVENOUS

## 2017-08-08 MED ORDER — PROPOFOL 10 MG/ML IV BOLUS
INTRAVENOUS | Status: DC | PRN
  Administered 2017-08-08: 30 mg via INTRAVENOUS

## 2017-08-08 MED ORDER — SODIUM CHLORIDE 0.9 % IV SOLN
INTRAVENOUS | Status: DC
  Administered 2017-08-08: 07:00:00 via INTRAVENOUS

## 2017-08-08 MED ORDER — PROPOFOL 10 MG/ML IV BOLUS
INTRAVENOUS | Status: AC
  Filled 2017-08-08: qty 20

## 2017-08-08 MED ORDER — HEPARIN SODIUM (PORCINE) 1000 UNIT/ML IJ SOLN
INTRAMUSCULAR | Status: DC | PRN
  Administered 2017-08-08: 3000 [IU] via INTRAVENOUS

## 2017-08-08 MED ORDER — HYDROCODONE-ACETAMINOPHEN 5-325 MG PO TABS: 1.0000 | ORAL_TABLET | ORAL | 0 refills | Status: DC | PRN

## 2017-08-08 MED ORDER — PROTAMINE SULFATE 10 MG/ML IV SOLN
INTRAVENOUS | Status: DC | PRN
  Administered 2017-08-08: 25 mg via INTRAVENOUS

## 2017-08-08 MED ORDER — FENTANYL CITRATE (PF) 250 MCG/5ML IJ SOLN
INTRAMUSCULAR | Status: AC
  Filled 2017-08-08: qty 5

## 2017-08-08 MED ORDER — SODIUM CHLORIDE 0.9 % IV SOLN
INTRAVENOUS | Status: DC | PRN
  Administered 2017-08-08: 08:00:00

## 2017-08-08 MED ORDER — PHENYLEPHRINE HCL 10 MG/ML IJ SOLN
INTRAMUSCULAR | Status: DC | PRN
  Administered 2017-08-08: 40 ug via INTRAVENOUS

## 2017-08-08 SURGICAL SUPPLY — 34 items
ADH SKN CLS APL DERMABOND .7 (GAUZE/BANDAGES/DRESSINGS) ×1
ARMBAND PINK RESTRICT EXTREMIT (MISCELLANEOUS) ×3 IMPLANT
CANISTER SUCT 3000ML PPV (MISCELLANEOUS) ×3 IMPLANT
CLIP VESOCCLUDE MED 24/CT (CLIP) IMPLANT
CLIP VESOCCLUDE MED 6/CT (CLIP) IMPLANT
CLIP VESOCCLUDE SM WIDE 24/CT (CLIP) IMPLANT
CLIP VESOCCLUDE SM WIDE 6/CT (CLIP) IMPLANT
COVER PROBE W GEL 5X96 (DRAPES) ×3 IMPLANT
DERMABOND ADVANCED (GAUZE/BANDAGES/DRESSINGS) ×2
DERMABOND ADVANCED .7 DNX12 (GAUZE/BANDAGES/DRESSINGS) ×1 IMPLANT
ELECT CAUTERY BLADE 6.4 (BLADE) ×2 IMPLANT
ELECT REM PT RETURN 9FT ADLT (ELECTROSURGICAL) ×3
ELECTRODE REM PT RTRN 9FT ADLT (ELECTROSURGICAL) ×1 IMPLANT
GLOVE BIOGEL PI IND STRL 7.5 (GLOVE) ×1 IMPLANT
GLOVE BIOGEL PI INDICATOR 7.5 (GLOVE) ×2
GLOVE SURG SS PI 7.5 STRL IVOR (GLOVE) ×3 IMPLANT
GOWN STRL REUS W/ TWL LRG LVL3 (GOWN DISPOSABLE) ×2 IMPLANT
GOWN STRL REUS W/ TWL XL LVL3 (GOWN DISPOSABLE) ×1 IMPLANT
GOWN STRL REUS W/TWL LRG LVL3 (GOWN DISPOSABLE) ×6
GOWN STRL REUS W/TWL XL LVL3 (GOWN DISPOSABLE) ×3
HEMOSTAT SNOW SURGICEL 2X4 (HEMOSTASIS) IMPLANT
KIT BASIN OR (CUSTOM PROCEDURE TRAY) ×3 IMPLANT
KIT ROOM TURNOVER OR (KITS) ×3 IMPLANT
NS IRRIG 1000ML POUR BTL (IV SOLUTION) ×3 IMPLANT
PACK CV ACCESS (CUSTOM PROCEDURE TRAY) ×3 IMPLANT
PAD ARMBOARD 7.5X6 YLW CONV (MISCELLANEOUS) ×6 IMPLANT
SUT PROLENE 6 0 CC (SUTURE) ×3 IMPLANT
SUT SILK 2 0 SH (SUTURE) IMPLANT
SUT VIC AB 3-0 SH 27 (SUTURE) ×3
SUT VIC AB 3-0 SH 27X BRD (SUTURE) ×1 IMPLANT
SUT VICRYL 4-0 PS2 18IN ABS (SUTURE) ×3 IMPLANT
TOWEL GREEN STERILE (TOWEL DISPOSABLE) ×3 IMPLANT
UNDERPAD 30X30 (UNDERPADS AND DIAPERS) ×3 IMPLANT
WATER STERILE IRR 1000ML POUR (IV SOLUTION) ×3 IMPLANT

## 2017-08-08 NOTE — Interval H&P Note (Signed)
History and Physical Interval Note:  08/08/2017 7:29 AM  [name]  has presented today for surgery, with the diagnosis of CHRONIC KIDNEY DISEASE STAGE IV  The various methods of treatment have been discussed with the patient and family. After consideration of risks, benefits and other options for treatment, the patient has consented to  Procedure(s): BASILIC VEIN TRANSPOSITION FIRST STAGE LEFT ARM (Left) as a surgical intervention .  The patient's history has been reviewed, patient examined, no change in status, stable for surgery.  I have reviewed the patient's chart and labs.  Questions were answered to the patient's satisfaction.     Annamarie Major

## 2017-08-08 NOTE — Anesthesia Postprocedure Evaluation (Signed)
Anesthesia Post Note  Patient: [name]  Procedure(s) Performed: BASILIC VEIN TRANSPOSITION FIRST STAGE LEFT ARM (Left Arm Upper)     Patient location during evaluation: PACU Anesthesia Type: MAC Level of consciousness: awake and alert Pain management: pain level controlled Vital Signs Assessment: post-procedure vital signs reviewed and stable Respiratory status: spontaneous breathing, nonlabored ventilation, respiratory function stable and patient connected to nasal cannula oxygen Cardiovascular status: stable and blood pressure returned to baseline Postop Assessment: no apparent nausea or vomiting Anesthetic complications: no    Last Vitals:  Vitals:   08/08/17 0609 08/08/17 0854  BP: (!) 150/91 126/68  Pulse: 83 77  Resp: 16 12  Temp: 36.7 C 36.6 C  SpO2: 98% 98%    Last Pain:  Vitals:   08/08/17 0609  TempSrc: Oral                 Pierce Biagini S

## 2017-08-08 NOTE — Transfer of Care (Signed)
Immediate Anesthesia Transfer of Care Note  Patient: [name]  Procedure(s) Performed: BASILIC VEIN TRANSPOSITION FIRST STAGE LEFT ARM (Left Arm Upper)  Patient Location: PACU  Anesthesia Type:MAC  Level of Consciousness: awake, alert  and oriented  Airway & Oxygen Therapy: Patient Spontanous Breathing and Patient connected to face mask oxygen  Post-op Assessment: Report given to RN and Post -op Vital signs reviewed and stable  Post vital signs: Reviewed and stable  Last Vitals:  Vitals:   08/08/17 0609 08/08/17 0854  BP: (!) 150/91 126/68  Pulse: 83 77  Resp: 16 12  Temp: 36.7 C (P) 36.6 C  SpO2: 98% 98%    Last Pain:  Vitals:   08/08/17 0609  TempSrc: Oral         Complications: No apparent anesthesia complications

## 2017-08-08 NOTE — Op Note (Signed)
    Patient name: [name] MRN: [id number] DOB: 12-Jul-1951 Sex: female  08/08/2017 Pre-operative Diagnosis: CKD Post-operative diagnosis:  Same Surgeon:  Annamarie Major Assistants:  Arlee Muslim Procedure:   First stage left basilic vein fistula creation Anesthesia: MAC Blood Loss: Minimal Specimens: None  Findings: 3 mm basilic vein  Indications: The patient comes in for fistula creation.  Preoperative vein mapping indicated she has an adequate basilic vein on the left  Procedure:  The patient was identified in the holding area and taken to [place] 16  The patient was then placed supine on the table. MAC anesthesia was administered.  The patient was prepped and draped in the usual sterile fashion.  A time out was called and antibiotics were administered.  Ultrasound was used to map the course of the basilic vein in the upper arm.  It appeared to be adequate for fistula creation.  1% lidocaine was used for local anesthesia.  A transverse incision was made just proximal to the antecubital crease.  I first dissected out the brachial artery which was a 3 mm disease-free artery.  I then identified the basilic vein and mobilized this ligating side branches with silk ties.  3000 units of heparin were given.  After the heparin circulated the brachial artery was occluded with vascular clamps.  A #11 blade was used to make an arteriotomy which was extended longitudinally with Potts scissors.  The vein was cut to the appropriate length and a running end to side anastomosis was created with 6-0 Prolene.  Prior to completion the appropriate flushing maneuvers were performed and the anastomosis was completed.  Hand-held Doppler was used to evaluate the basilic vein which had a good venous signal in it.  There was also a brisk radial artery Doppler signal.  25 mg of protamine was given.  The incision was closed with 2 layers of 3-0 Vicryl followed by Dermabond.  There were no immediate  complications.   Disposition: To PACU stable   V. Annamarie Major, M.D. [facility] [place] Office: [phone] Pager:  [phone]

## 2017-08-08 NOTE — Anesthesia Preprocedure Evaluation (Signed)
Anesthesia Evaluation  Patient identified by MRN, date of birth, ID band Patient awake    Reviewed: Allergy & Precautions, NPO status , Patient's Chart, lab work & pertinent test results  Airway Mallampati: II  TM Distance: >3 FB Neck ROM: Full    Dental no notable dental hx.    Pulmonary neg pulmonary ROS,    Pulmonary exam normal breath sounds clear to auscultation       Cardiovascular hypertension, Normal cardiovascular exam Rhythm:Regular Rate:Normal     Neuro/Psych negative neurological ROS  negative psych ROS   GI/Hepatic negative GI ROS, Neg liver ROS,   Endo/Other  diabetes  Renal/GU DialysisRenal disease  negative genitourinary   Musculoskeletal negative musculoskeletal ROS (+)   Abdominal   Peds negative pediatric ROS (+)  Hematology  (+) anemia ,   Anesthesia Other Findings   Reproductive/Obstetrics negative OB ROS                             Anesthesia Physical Anesthesia Plan  ASA: III  Anesthesia Plan: MAC   Post-op Pain Management:    Induction: Intravenous  PONV Risk Score and Plan:   Airway Management Planned: Simple Face Mask  Additional Equipment:   Intra-op Plan:   Post-operative Plan:   Informed Consent: I have reviewed the patients History and Physical, chart, labs and discussed the procedure including the risks, benefits and alternatives for the proposed anesthesia with the patient or authorized representative who has indicated his/her understanding and acceptance.   Dental advisory given  Plan Discussed with: CRNA and Surgeon  Anesthesia Plan Comments:         Anesthesia Quick Evaluation

## 2017-08-09 ENCOUNTER — Encounter (HOSPITAL_COMMUNITY): Payer: Self-pay | Admitting: Surgery

## 2017-08-13 ENCOUNTER — Telehealth: Payer: Self-pay | Admitting: Surgery

## 2017-08-13 NOTE — Telephone Encounter (Signed)
-----   Message from Mena Goes, RN sent at 08/08/2017 12:46 PM EST ----- Regarding: 6 weeks w/ duplex   ----- Message ----- From: Iline Oven Sent: 08/08/2017   8:44 AM To: Vvs Charge Pool  Can you schedule an appt with Dr. Trula Slade in about 6 weeks with fistula duplex.  PO L brachiobasilic fistula. Thanks, MAtt

## 2017-08-13 NOTE — Telephone Encounter (Signed)
Sched lab 09/12/17 at 1:00 and MD 09/15/17 at 11:15. Spoke to pt, pt said to just give her date and time of first appt and she will get the rest of the info when she comes into the office.

## 2017-08-18 ENCOUNTER — Other Ambulatory Visit: Payer: Self-pay

## 2017-08-18 DIAGNOSIS — Z992 Dependence on renal dialysis: Principal | ICD-10-CM

## 2017-08-18 DIAGNOSIS — N186 End stage renal disease: Secondary | ICD-10-CM

## 2017-08-21 ENCOUNTER — Other Ambulatory Visit: Payer: Self-pay | Admitting: Family Medicine

## 2017-09-12 ENCOUNTER — Ambulatory Visit (HOSPITAL_COMMUNITY)
Admission: RE | Admit: 2017-09-12 | Discharge: 2017-09-12 | Disposition: A | Discharge status: Home / Self Care | Payer: Medicare Other | Source: Ambulatory Visit | Attending: Surgery | Admitting: Surgery

## 2017-09-12 DIAGNOSIS — N186 End stage renal disease: Secondary | ICD-10-CM | POA: Diagnosis not present

## 2017-09-12 DIAGNOSIS — Z992 Dependence on renal dialysis: Secondary | ICD-10-CM | POA: Insufficient documentation

## 2017-09-15 ENCOUNTER — Encounter: Payer: Medicare Other | Admitting: Surgery

## 2017-10-01 ENCOUNTER — Other Ambulatory Visit: Payer: Medicare Other

## 2017-10-01 DIAGNOSIS — Z79899 Other long term (current) drug therapy: Secondary | ICD-10-CM | POA: Insufficient documentation

## 2017-10-01 DIAGNOSIS — E1140 Type 2 diabetes mellitus with diabetic neuropathy, unspecified: Secondary | ICD-10-CM

## 2017-10-01 DIAGNOSIS — E785 Hyperlipidemia, unspecified: Secondary | ICD-10-CM | POA: Diagnosis not present

## 2017-10-02 LAB — CBC WITH DIFFERENTIAL/PLATELET
Basophils Absolute: 39 cells/uL (ref 0–200)
Basophils Relative: 0.5 %
Eosinophils Absolute: 139 cells/uL (ref 15–500)
Eosinophils Relative: 1.8 %
HCT: 31.1 % — ABNORMAL LOW (ref 35.0–45.0)
Hemoglobin: 10.6 g/dL — ABNORMAL LOW (ref 11.7–15.5)
Lymphs Abs: 1394 cells/uL (ref 850–3900)
MCH: 29.5 pg (ref 27.0–33.0)
MCHC: 34.1 g/dL (ref 32.0–36.0)
MCV: 86.6 fL (ref 80.0–100.0)
MPV: 10.5 fL (ref 7.5–12.5)
Monocytes Relative: 5.7 %
Neutro Abs: 5690 cells/uL (ref 1500–7800)
Neutrophils Relative %: 73.9 %
Platelets: 187 10*3/uL (ref 140–400)
RBC: 3.59 10*6/uL — ABNORMAL LOW (ref 3.80–5.10)
RDW: 13.1 % (ref 11.0–15.0)
Total Lymphocyte: 18.1 %
WBC mixed population: 439 cells/uL (ref 200–950)
WBC: 7.7 10*3/uL (ref 3.8–10.8)

## 2017-10-02 LAB — COMPREHENSIVE METABOLIC PANEL
AG Ratio: 1.3 (calc) (ref 1.0–2.5)
ALT: 9 U/L (ref 6–29)
AST: 9 U/L — ABNORMAL LOW (ref 10–35)
Albumin: 3.8 g/dL (ref 3.6–5.1)
Alkaline phosphatase (APISO): 112 U/L (ref 33–130)
BUN/Creatinine Ratio: 15 (calc) (ref 6–22)
BUN: 48 mg/dL — ABNORMAL HIGH (ref 7–25)
CO2: 27 mmol/L (ref 20–32)
Calcium: 8.7 mg/dL (ref 8.6–10.4)
Chloride: 109 mmol/L (ref 98–110)
Creat: 3.23 mg/dL — ABNORMAL HIGH (ref 0.50–0.99)
Globulin: 2.9 g/dL (calc) (ref 1.9–3.7)
Glucose, Bld: 67 mg/dL (ref 65–99)
Potassium: 5.4 mmol/L — ABNORMAL HIGH (ref 3.5–5.3)
Sodium: 146 mmol/L (ref 135–146)
Total Bilirubin: 0.5 mg/dL (ref 0.2–1.2)
Total Protein: 6.7 g/dL (ref 6.1–8.1)

## 2017-10-02 LAB — HEMOGLOBIN A1C
Hgb A1c MFr Bld: 8.2 % of total Hgb — ABNORMAL HIGH (ref <5.7)
Mean Plasma Glucose: 189 (calc)
eAG (mmol/L): 10.4 (calc)

## 2017-10-02 LAB — LIPID PANEL
Cholesterol: 164 mg/dL (ref <200)
HDL: 33 mg/dL — ABNORMAL LOW (ref >50)
LDL Cholesterol (Calc): 101 mg/dL (calc) — ABNORMAL HIGH
Non-HDL Cholesterol (Calc): 131 mg/dL (calc) — ABNORMAL HIGH (ref <130)
Total CHOL/HDL Ratio: 5 (calc) — ABNORMAL HIGH (ref <5.0)
Triglycerides: 186 mg/dL — ABNORMAL HIGH (ref <150)

## 2017-10-06 ENCOUNTER — Ambulatory Visit (INDEPENDENT_AMBULATORY_CARE_PROVIDER_SITE_OTHER): Payer: Medicare Other | Admitting: Family Medicine

## 2017-10-06 ENCOUNTER — Encounter: Payer: Self-pay | Admitting: Family Medicine

## 2017-10-06 VITALS — BP 140/60 | HR 72 | Temp 97.6°F | Resp 20 | Ht 67.0 in | Wt 255.0 lb

## 2017-10-06 DIAGNOSIS — E119 Type 2 diabetes mellitus without complications: Secondary | ICD-10-CM | POA: Diagnosis not present

## 2017-10-06 DIAGNOSIS — Z9119 Patient's noncompliance with other medical treatment and regimen: Secondary | ICD-10-CM

## 2017-10-06 DIAGNOSIS — I10 Essential (primary) hypertension: Secondary | ICD-10-CM | POA: Diagnosis not present

## 2017-10-06 DIAGNOSIS — N185 Chronic kidney disease, stage 5: Secondary | ICD-10-CM

## 2017-10-06 DIAGNOSIS — C641 Malignant neoplasm of right kidney, except renal pelvis: Secondary | ICD-10-CM

## 2017-10-06 DIAGNOSIS — D631 Anemia in chronic kidney disease: Secondary | ICD-10-CM | POA: Diagnosis not present

## 2017-10-06 DIAGNOSIS — E785 Hyperlipidemia, unspecified: Secondary | ICD-10-CM

## 2017-10-06 DIAGNOSIS — Z91199 Patient's noncompliance with other medical treatment and regimen due to unspecified reason: Secondary | ICD-10-CM

## 2017-10-06 DIAGNOSIS — Z794 Long term (current) use of insulin: Secondary | ICD-10-CM

## 2017-10-06 MED ORDER — DULAGLUTIDE 0.75 MG/0.5ML ~~LOC~~ SOAJ: 0.7500 mg | SUBCUTANEOUS | 5 refills | Status: DC

## 2017-10-06 NOTE — Progress Notes (Signed)
Subjective:    Patient ID: [name], female    DOB: 20-Oct-1950, 67 y.o.   MRN: [id number]  HPI  Patient was recently admitted to the hospital under the care of her urologist for resection of renal cell carcinoma. 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 Right renal mass 03/10/2017  . Diabetic ulcer of right foot associated with type 2 diabetes mellitus ([place])   . Type 2 diabetes, controlled, with neuropathy ([place]) 06/22/2013  . Ulcer of other part of foot 06/22/2013    History and Physical: For full details, please see admission history and physical. Briefly, [name] is a 67 y.o. year old patient with History of chronic kidney disease with a preoperative creatinine of approximately 2 was found have a 9-1/2 cm right renal mass and left adrenal mass concerning for a isolated solitary metastasis. She underwent a right radical adrenal sparing robotic nephrectomy and a robotic left adrenalectomy. She is midline hospital for routine postoperative care.   Hospital Course: Patient tolerated the procedure well.  She was then transferred to the floor after an uneventful PACU stay.  Her hospital course was uncomplicated.  On POD#4 she had met discharge criteria: was eating a regular diet, was up and ambulating independently,  pain was well controlled, was voiding without a catheter, and was ready to for discharge.   In regards to her renal function, her creatinine did slowly rise to a maximal level of 3.9. On the date of discharge it did decrease to 3.8. Potassium is stable at 5.0. She made over 1 L of urine  output in 24 hours preceding discharge. She will be discharged and has been instructed to follow up with her nephrologist early next week as she may need modifications to her medications for the long-term. At this time, does not appear that she needs any urgent or emergent dialysis due to her urine output, renal function, and stable potassium.  The patient did have issues with ambulation and was not felt to be strong enough to go directly home. She will be discharged to subacute nursing facility for rehabilitation. The ultimate goal will be discharged home from that facility once her strength has been recovered.  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  Preoperative hemoglobin was 11.3 and was down to 8.1 at discharge.  Patient denies any bleeding or bruising. She is uncertain of whether or not anyone has repeated her hemoglobin since discharge. She is not currently taking any iron supplements.  HgA1c was 8.3 in 8/18. Patient continues to take victoza.  However, she independently decrease [place] to 125 units a day.  She denies any hypoglycemic spells. She did this she unilaterally without symptoms. She randomly checks her sugars. She has not seen a single sugar below 100 or a single sugar above 200. She is scheduled to follow-up with her urologist in October. I imagine, will need repeat imaging every 6 months initially to monitor for recurrence.  She has stage V chronic kidney disease but  at the present time is not hemodialysis dependent. She denies any uremia. She still reports normal urine output. She is due to recheck her CO2 as well as her potassium level. At the present time there are no clinical indications for dialysis. There is no evidence of fluid overload on exam. She is already met with a nephrologist who recommended seeing her back again in 3 months. We  may need to consider having to arrange for erythropoietin if the patient remains anemic despite adequate repletion of iron deficiency.  At that time, my plan was: Problem #1, regarding her clear cell carcinoma of the kidney with isolated adrenal metastasis, the patient has successfully undergone surgical resection with clear margins. Clinically she is "cured" at the present time. She follows up with her urologist in October. I anticipate that she will require CT scans without contrast given her precarious renal function every 6 months to monitor for recurrence. At the present time there is no indication for chemotherapy.  Problem #2 chronic kidney disease. Patient is already established with a nephrologist. He will be seeing her every 3 months. At the present time there is no indication for dialysis although I will repeat a BMP to monitor her bicarbonate, potassium. I will also recheck her hemoglobin to determine if she requires iron replacement versus erythropoietin for her anemia.   Problem #3 postoperative anemia. Hemoglobin declined 3. soft or surgery. Repeat CBC today. If hemoglobin is back to baseline, we'll monitor along every 3 months. If the patient is still anemic, I will check an iron level as well as a percent saturation. If iron level is normal and percent saturation is elevated, the patient may require EPO from renal.   Problem #4, insulin-dependent diabetes. I explained to the patient that she cannot skip meals and insulin. She runs the risk of hypoglycemia. I have asked her to check her fasting blood sugar and two-hour postprandial sugars and record the values and report them to me in one week. I will tailor her dose of Triseba to try to achieve blood sugars between 100-200. I believe we can maintain this range, this will provide a margin of safety for the patient and also prevent worsening of her renal function and long-term morbidity mortality.  07/07/17 Here for follow up.  Potassium  remained persistently elevated 5.5-5.8 despite trying lasix.    Hgb remained between 9.2 and 10.  She has an appointment to see Dr. Justin Mend December 28.  However she has been eating potassium containing foods such as oranges and bananas recently.  She is also been eating raisins.  She had a do this because she was snowed in her home and had limited food options.  Regarding her blood sugars, they sound relatively well controlled although she is not checking them frequently.  Her fasting blood sugars are ranging between 90 and 150 with the vast majority between 90 and 110.  Her 2-hour postprandial sugars are averaging between 150 and 200 with the majority being less than 180.  She seldom sees anything greater than 200 according to the patient.  She has a markedly abnormal diabetic foot exam and she is not wearing diabetic shoes.  She has not followed up with her podiatrist in some time.  Today she is wearing slip on shoes with wet socks.  She has had Pneumovax 23.  She is due for Prevnar 13.  Her flu shot is up-to-date.  She denies any chest pain shortness of breath or dyspnea on exertion.  She denies  any hypoglycemic episodes.  She denies any polyuria, polydipsia, or blurry vision.  She is scheduled to follow-up with her urologist in February for repeat to assess for recurrence of her cancer.  At that time, my plan was: I am very happy with her blood pressure today.  I will make no changes in her antihypertensive medication.  Regarding her diabetes, her reported sugars sound adequately controlled.  I will check a hemoglobin A1c.  I would be willing to accept a hemoglobin A1c 7.5 or less for this patient.  I discouraged the patient from wearing her slip on shoes.  I recommended wearing properly fitting diabetic shoes to offload pressure from her toes to prevent future pressure sores.  Her flu shot is up-to-date.  She has had Pneumovax 23.  I will give her Prevnar 13 today.  I will check a fasting lipid panel.  Her goal  LDL cholesterol is less than 100.  Regarding her chronic kidney disease, I will monitor her creatinine along with her potassium and bicarbonate.  If her potassium is greater than 6, she will require Kayexalate.  She has planned follow-up with her nephrologist later this month.  Regarding her anemia, I will recheck a CBC.  If less than 10, she may need to discuss erythropoietin with her nephrologist as well.  10/06/17 Most recent labs are listed below: Lab on 10/01/2017  Component Date Value Ref Range Status  . WBC 10/01/2017 7.7  3.8 - 10.8 Thousand/uL Final  . RBC 10/01/2017 3.59* 3.80 - 5.10 Million/uL Final  . Hemoglobin 10/01/2017 10.6* 11.7 - 15.5 g/dL Final  . HCT 10/01/2017 31.1* 35.0 - 45.0 % Final  . MCV 10/01/2017 86.6  80.0 - 100.0 fL Final  . MCH 10/01/2017 29.5  27.0 - 33.0 pg Final  . MCHC 10/01/2017 34.1  32.0 - 36.0 g/dL Final  . RDW 10/01/2017 13.1  11.0 - 15.0 % Final  . Platelets 10/01/2017 187  140 - 400 Thousand/uL Final  . MPV 10/01/2017 10.5  7.5 - 12.5 fL Final  . Neutro Abs 10/01/2017 5,690  1,500 - 7,800 cells/uL Final  . Lymphs Abs 10/01/2017 1,394  850 - 3,900 cells/uL Final  . WBC mixed population 10/01/2017 439  200 - 950 cells/uL Final  . Eosinophils Absolute 10/01/2017 139  15 - 500 cells/uL Final  . Basophils Absolute 10/01/2017 39  0 - 200 cells/uL Final  . Neutrophils Relative % 10/01/2017 73.9  % Final  . Total Lymphocyte 10/01/2017 18.1  % Final  . Monocytes Relative 10/01/2017 5.7  % Final  . Eosinophils Relative 10/01/2017 1.8  % Final  . Basophils Relative 10/01/2017 0.5  % Final  . Glucose, Bld 10/01/2017 67  65 - 99 mg/dL Final   Comment: .            Fasting reference interval .   . BUN 10/01/2017 48* 7 - 25 mg/dL Final  . Creat 10/01/2017 3.23* 0.50 - 0.99 mg/dL Final   Comment: For patients >87 years of age, the reference limit for Creatinine is approximately 13% higher for people identified as African-American. .   Havery Moros  Ratio 10/01/2017 15  6 - 22 (calc) Final  . Sodium 10/01/2017 146  135 - 146 mmol/L Final  . Potassium 10/01/2017 5.4* 3.5 - 5.3 mmol/L Final  . Chloride 10/01/2017 109  98 - 110 mmol/L Final  . CO2 10/01/2017 27  20 - 32 mmol/L Final  . Calcium 10/01/2017 8.7  8.6 - 10.4 mg/dL Final  .  Total Protein 10/01/2017 6.7  6.1 - 8.1 g/dL Final  . Albumin 10/01/2017 3.8  3.6 - 5.1 g/dL Final  . Globulin 10/01/2017 2.9  1.9 - 3.7 g/dL (calc) Final  . AG Ratio 10/01/2017 1.3  1.0 - 2.5 (calc) Final  . Total Bilirubin 10/01/2017 0.5  0.2 - 1.2 mg/dL Final  . Alkaline phosphatase (APISO) 10/01/2017 112  33 - 130 U/L Final  . AST 10/01/2017 9* 10 - 35 U/L Final  . ALT 10/01/2017 9  6 - 29 U/L Final  . Hgb A1c MFr Bld 10/01/2017 8.2* <5.7 % of total Hgb Final   Comment: For someone without known diabetes, a hemoglobin A1c value of 6.5% or greater indicates that they may have  diabetes and this should be confirmed with a follow-up  test. . For someone with known diabetes, a value <7% indicates  that their diabetes is well controlled and a value  greater than or equal to 7% indicates suboptimal  control. A1c targets should be individualized based on  duration of diabetes, age, comorbid conditions, and  other considerations. . Currently, no consensus exists regarding use of hemoglobin A1c for diagnosis of diabetes for children. .   . Mean Plasma Glucose 10/01/2017 189  (calc) Final  . eAG (mmol/L) 10/01/2017 10.4  (calc) Final  . Cholesterol 10/01/2017 164  <200 mg/dL Final  . HDL 10/01/2017 33* >50 mg/dL Final  . Triglycerides 10/01/2017 186* <150 mg/dL Final  . LDL Cholesterol (Calc) 10/01/2017 101* mg/dL (calc) Final   Comment: Reference range: <100 . Desirable range <100 mg/dL for primary prevention;   <70 mg/dL for patients with CHD or diabetic patients  with > or = 2 CHD risk factors. Marland Kitchen LDL-C is now calculated using the Martin-Hopkins  calculation, which is a validated novel method  providing  better accuracy than the Friedewald equation in the  estimation of LDL-C.  Cresenciano Genre et al. Annamaria Helling. 3154;008(67): 2061-2068  (http://education.[employer].com/faq/FAQ164)   . Total CHOL/HDL Ratio 10/01/2017 5.0* <5.0 (calc) Final  . Non-HDL Cholesterol (Calc) 10/01/2017 131* <130 mg/dL (calc) Final   Comment: For patients with diabetes plus 1 major ASCVD risk  factor, treating to a non-HDL-C goal of <100 mg/dL  (LDL-C of <70 mg/dL) is considered a therapeutic  option.     Past Medical History:  Diagnosis Date  . Anemia   . Arthritis   . Asthma   . CKD stage 3 due to type 2 diabetes mellitus ([place])   . Depression   . GERD (gastroesophageal reflux disease)   . HLD (hyperlipidemia)   . Hypertension   . Microalbuminuria due to type 2 diabetes mellitus ([place])   . Neuromuscular disorder ([place])    diabetic neuropathy  . Pneumonia    hx of   . Renal cell cancer ([place])   . 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 BASCILIC VEIN TRANSPOSITION Left 08/08/2017   Procedure: BASILIC VEIN TRANSPOSITION FIRST STAGE LEFT ARM;  Surgeon: Serafina Mitchell, MD;  Location: [place];  Service: Vascular;  Laterality: Left;  . ROBOTIC ADRENALECTOMY Left 03/10/2017   Procedure: XI ROBOTIC ADRENALECTOMY;  Surgeon: Nickie Retort, MD;  Location: [facility];  Service: Urology;  Laterality: Left;  . ROBOTIC ASSITED PARTIAL NEPHRECTOMY Right 03/10/2017   Procedure: XI ROBOTIC ASSITED RADICAL NEPHRECTOMY;  Surgeon: Nickie Retort, MD;  Location: [facility];  Service: Urology;  Laterality: Right;  . TOE AMPUTATION Bilateral  both great toe ,, left foot 2nd toe 1/2   Current Outpatient Medications on File Prior to Visit  Medication Sig Dispense Refill  . amLODipine (NORVASC) 10 MG tablet TAKE ONE TABLET BY MOUTH ONCE DAILY (Patient taking differently: TAKE ONE TABLET BY MOUTH ONCE IN THE EVENING) 90 tablet 3  .  atorvastatin (LIPITOR) 40 MG tablet Take 1 tablet (40 mg total) by mouth daily. 90 tablet 1  . Blood Glucose Monitoring Suppl (ONE TOUCH ULTRA 2) w/Device KIT Use to check BS BID-QID Dx:E11.9 1 each 1  . BREO ELLIPTA 100-25 MCG/INH AEPB INHALE ONE PUFF BY MOUTH ONCE DAILY 60 each 11  . ferrous gluconate (IRON 27) 240 (27 FE) MG tablet Take 240 mg by mouth daily.    . furosemide (LASIX) 20 MG tablet Take 1 tablet (20 mg total) daily by mouth. 30 tablet 3  . gabapentin (NEURONTIN) 300 MG capsule Take 1 capsule (300 mg total) by mouth 3 (three) times daily. (Patient taking differently: Take 300 mg by mouth 3 (three) times daily as needed (pain). ) 90 capsule 2  . glucose blood (ONE TOUCH ULTRA TEST) test strip Use as instructed 300 each 3  . HYDROcodone-acetaminophen (NORCO) 5-325 MG tablet Take 1 tablet by mouth every 4 (four) hours as needed for moderate pain. 12 tablet 0  . Lancets (ONETOUCH ULTRASOFT) lancets Use as instructed 300 each 3  . liraglutide (VICTOZA) 18 MG/3ML SOPN Inject SQ 1.2 MG ONCE A DAY (Patient not taking: Reported on 07/30/2017) 6 pen 1  . liraglutide 18 MG/3ML SOPN 0.6 mg sq qd x 1 week then 1.'2mg'$  sq qd (Patient taking differently: Inject 1.2 mg into the skin every evening. ) 6 pen 3  . polyethylene glycol (MIRALAX / GLYCOLAX) packet Take 17 g by mouth daily as needed for mild constipation.    Marland Kitchen PROAIR HFA 108 (90 Base) MCG/ACT inhaler INHALE TWO PUFFS INTO LUNGS EVERY 6 HOURS AS NEEDED FOR WHEEZING AND FOR SHORTNESS OF BREATH 9 each 4  . SODIUM BICARBONATE PO Take 1 tablet by mouth 2 (two) times daily.    . TRADJENTA 5 MG TABS tablet TAKE 1 TABLET BY MOUTH ONCE DAILY 90 tablet 2  . TRESIBA FLEXTOUCH 100 UNIT/ML SOPN FlexTouch Pen INJECT 150 UNITS SUBCUTANEOUSLY ONCE DAILY 45 mL 2  . vitamin A 10000 UNIT capsule Take 10,000 Units by mouth daily.    . vitamin A 8000 UNIT capsule Take 8,000 Units by mouth daily.     No current facility-administered medications on file prior to  visit.    Allergies  Allergen Reactions  . Penicillins Other (See Comments)    From childhood Has patient had a PCN reaction causing immediate rash, facial/tongue/throat swelling, SOB or lightheadedness with hypotension: Unknown Has patient had a PCN reaction causing severe rash involving mucus membranes or skin necrosis: Unknown Has patient had a PCN reaction that required hospitalization: Unknown Has patient had a PCN reaction occurring within the last 10 years: Unknown If all of the above answers are "NO", then may proceed with Cephalosporin use.   . Adhesive [Tape] Rash   Social History   Socioeconomic History  . Marital status: Single    Spouse name: Not on file  . Number of children: Not on file  . Years of education: Not on file  . Highest education level: Not on file  Social Needs  . Financial resource strain: Not on file  . Food insecurity - worry: Not on file  . Food insecurity -  inability: Not on file  . Transportation needs - medical: Not on file  . Transportation needs - non-medical: Not on file  Occupational History  . Not on file  Tobacco Use  . Smoking status: Never Smoker  . Smokeless tobacco: Never Used  Substance and Sexual Activity  . Alcohol use: Yes    Alcohol/week: 0.0 oz    Comment: occ   . Drug use: No  . Sexual activity: Not Currently    Partners: Male  Other Topics Concern  . Not on file  Social History Narrative  . Not on file     Review of Systems  All other systems reviewed and are negative.      Objective:   Physical Exam  Constitutional: She appears well-developed and well-nourished. No distress.  Cardiovascular: Normal rate, regular rhythm and normal heart sounds.  Pulmonary/Chest: Effort normal and breath sounds normal. No respiratory distress. She has no wheezes. She has no rales.  Abdominal: Soft. Bowel sounds are normal. She exhibits no distension. There is no tenderness. There is no rebound and no guarding.    Musculoskeletal: She exhibits edema.  Skin: She is not diaphoretic.  Vitals reviewed.         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  Because of her chronic kidney disease, her options for oral medications are limited.  Her options include adding mealtime insulin to her standing dose of Triseba.  Her other option would be adding Trulicity.  I am in favor of this because this patient at times is noncompliant.  She frequently forgets to take medication.  Once a week dosing would ensure compliance.  It also would help with her appetite and possibly help contribute to weight loss.  After a long discussion, the patient agrees to try Trulicity 1.82 mg sq weekly and recheck in 3 months.  Her blood pressure is adequately controlled.  Her LDL cholesterol is borderline.  Renal function remains stable.  Anemia is acceptable with a hemoglobin greater than 10.  She is scheduled to follow-up with her urologist to discuss her CAT scan within the next month.

## 2017-10-16 DIAGNOSIS — H25013 Cortical age-related cataract, bilateral: Secondary | ICD-10-CM | POA: Diagnosis not present

## 2017-10-16 DIAGNOSIS — H35033 Hypertensive retinopathy, bilateral: Secondary | ICD-10-CM | POA: Diagnosis not present

## 2017-10-16 DIAGNOSIS — H2513 Age-related nuclear cataract, bilateral: Secondary | ICD-10-CM | POA: Diagnosis not present

## 2017-10-16 DIAGNOSIS — E119 Type 2 diabetes mellitus without complications: Secondary | ICD-10-CM | POA: Diagnosis not present

## 2017-10-16 DIAGNOSIS — E113393 Type 2 diabetes mellitus with moderate nonproliferative diabetic retinopathy without macular edema, bilateral: Secondary | ICD-10-CM | POA: Diagnosis not present

## 2017-10-16 LAB — HM DIABETES EYE EXAM

## 2017-10-20 ENCOUNTER — Other Ambulatory Visit: Payer: Self-pay | Admitting: Family Medicine

## 2017-10-28 ENCOUNTER — Encounter: Payer: Self-pay | Admitting: Family Medicine

## 2017-10-28 DIAGNOSIS — E11319 Type 2 diabetes mellitus with unspecified diabetic retinopathy without macular edema: Secondary | ICD-10-CM | POA: Insufficient documentation

## 2017-10-30 ENCOUNTER — Encounter: Payer: Self-pay | Admitting: *Deleted

## 2017-10-31 DIAGNOSIS — D49511 Neoplasm of unspecified behavior of right kidney: Secondary | ICD-10-CM | POA: Diagnosis not present

## 2017-10-31 DIAGNOSIS — R109 Unspecified abdominal pain: Secondary | ICD-10-CM | POA: Diagnosis not present

## 2017-10-31 DIAGNOSIS — R0602 Shortness of breath: Secondary | ICD-10-CM | POA: Diagnosis not present

## 2017-11-05 ENCOUNTER — Encounter: Payer: Self-pay | Admitting: Family Medicine

## 2017-11-18 DIAGNOSIS — D49511 Neoplasm of unspecified behavior of right kidney: Secondary | ICD-10-CM | POA: Diagnosis not present

## 2017-11-18 DIAGNOSIS — D3500 Benign neoplasm of unspecified adrenal gland: Secondary | ICD-10-CM | POA: Diagnosis not present

## 2017-12-22 ENCOUNTER — Telehealth: Payer: Self-pay

## 2017-12-22 NOTE — Telephone Encounter (Signed)
[name] called and states [name] is having problems with her allergies her eyes are itching and wanted to know what [employer] could take.I informed [name] that patient could take was instructed to take otc claritin or zyrtec to help with her  Allergy symptoms

## 2018-01-01 ENCOUNTER — Other Ambulatory Visit: Payer: Medicare Other

## 2018-01-01 DIAGNOSIS — Z794 Long term (current) use of insulin: Secondary | ICD-10-CM | POA: Diagnosis not present

## 2018-01-01 DIAGNOSIS — E785 Hyperlipidemia, unspecified: Secondary | ICD-10-CM | POA: Diagnosis not present

## 2018-01-01 DIAGNOSIS — E119 Type 2 diabetes mellitus without complications: Secondary | ICD-10-CM

## 2018-01-01 DIAGNOSIS — N185 Chronic kidney disease, stage 5: Secondary | ICD-10-CM

## 2018-01-02 LAB — CBC WITH DIFFERENTIAL/PLATELET
Basophils Absolute: 53 cells/uL (ref 0–200)
Basophils Relative: 0.7 %
Eosinophils Absolute: 372 cells/uL (ref 15–500)
Eosinophils Relative: 4.9 %
HCT: 31.1 % — ABNORMAL LOW (ref 35.0–45.0)
Hemoglobin: 10.6 g/dL — ABNORMAL LOW (ref 11.7–15.5)
Lymphs Abs: 1604 cells/uL (ref 850–3900)
MCH: 29.4 pg (ref 27.0–33.0)
MCHC: 34.1 g/dL (ref 32.0–36.0)
MCV: 86.4 fL (ref 80.0–100.0)
MPV: 10.8 fL (ref 7.5–12.5)
Monocytes Relative: 6.2 %
Neutro Abs: 5100 cells/uL (ref 1500–7800)
Neutrophils Relative %: 67.1 %
Platelets: 177 10*3/uL (ref 140–400)
RBC: 3.6 10*6/uL — ABNORMAL LOW (ref 3.80–5.10)
RDW: 13.7 % (ref 11.0–15.0)
Total Lymphocyte: 21.1 %
WBC mixed population: 471 cells/uL (ref 200–950)
WBC: 7.6 10*3/uL (ref 3.8–10.8)

## 2018-01-02 LAB — LIPID PANEL
Cholesterol: 179 mg/dL (ref <200)
HDL: 34 mg/dL — ABNORMAL LOW (ref >50)
LDL Cholesterol (Calc): 114 mg/dL (calc) — ABNORMAL HIGH
Non-HDL Cholesterol (Calc): 145 mg/dL (calc) — ABNORMAL HIGH (ref <130)
Total CHOL/HDL Ratio: 5.3 (calc) — ABNORMAL HIGH (ref <5.0)
Triglycerides: 193 mg/dL — ABNORMAL HIGH (ref <150)

## 2018-01-02 LAB — COMPREHENSIVE METABOLIC PANEL
AG Ratio: 1.3 (calc) (ref 1.0–2.5)
ALT: 7 U/L (ref 6–29)
AST: 9 U/L — ABNORMAL LOW (ref 10–35)
Albumin: 3.9 g/dL (ref 3.6–5.1)
Alkaline phosphatase (APISO): 117 U/L (ref 33–130)
BUN/Creatinine Ratio: 15 (calc) (ref 6–22)
BUN: 58 mg/dL — ABNORMAL HIGH (ref 7–25)
CO2: 27 mmol/L (ref 20–32)
Calcium: 8.7 mg/dL (ref 8.6–10.4)
Chloride: 107 mmol/L (ref 98–110)
Creat: 3.88 mg/dL — ABNORMAL HIGH (ref 0.50–0.99)
Globulin: 2.9 g/dL (calc) (ref 1.9–3.7)
Glucose, Bld: 74 mg/dL (ref 65–99)
Potassium: 5.3 mmol/L (ref 3.5–5.3)
Sodium: 143 mmol/L (ref 135–146)
Total Bilirubin: 0.4 mg/dL (ref 0.2–1.2)
Total Protein: 6.8 g/dL (ref 6.1–8.1)

## 2018-01-02 LAB — HEMOGLOBIN A1C
Hgb A1c MFr Bld: 7.3 % of total Hgb — ABNORMAL HIGH (ref <5.7)
Mean Plasma Glucose: 163 (calc)
eAG (mmol/L): 9 (calc)

## 2018-01-06 ENCOUNTER — Other Ambulatory Visit: Payer: Self-pay | Admitting: Family Medicine

## 2018-01-08 ENCOUNTER — Ambulatory Visit (INDEPENDENT_AMBULATORY_CARE_PROVIDER_SITE_OTHER): Payer: Medicare Other | Admitting: Family Medicine

## 2018-01-08 ENCOUNTER — Encounter: Payer: Self-pay | Admitting: Family Medicine

## 2018-01-08 VITALS — BP 132/60 | HR 88 | Temp 98.2°F | Resp 20 | Ht 67.0 in | Wt 260.0 lb

## 2018-01-08 DIAGNOSIS — N949 Unspecified condition associated with female genital organs and menstrual cycle: Secondary | ICD-10-CM | POA: Diagnosis not present

## 2018-01-08 DIAGNOSIS — I10 Essential (primary) hypertension: Secondary | ICD-10-CM | POA: Diagnosis not present

## 2018-01-08 DIAGNOSIS — D631 Anemia in chronic kidney disease: Secondary | ICD-10-CM | POA: Diagnosis not present

## 2018-01-08 DIAGNOSIS — E785 Hyperlipidemia, unspecified: Secondary | ICD-10-CM | POA: Diagnosis not present

## 2018-01-08 DIAGNOSIS — I2510 Atherosclerotic heart disease of native coronary artery without angina pectoris: Secondary | ICD-10-CM | POA: Diagnosis not present

## 2018-01-08 DIAGNOSIS — E119 Type 2 diabetes mellitus without complications: Secondary | ICD-10-CM | POA: Diagnosis not present

## 2018-01-08 DIAGNOSIS — N185 Chronic kidney disease, stage 5: Secondary | ICD-10-CM

## 2018-01-08 DIAGNOSIS — Z794 Long term (current) use of insulin: Secondary | ICD-10-CM

## 2018-01-08 MED ORDER — ROSUVASTATIN CALCIUM 20 MG PO TABS: 20.0000 mg | ORAL_TABLET | Freq: Every day | ORAL | 3 refills | Status: DC

## 2018-01-08 NOTE — Progress Notes (Signed)
Subjective:    Patient ID: [name], female    DOB: 1950/09/17, 67 y.o.   MRN: [id number]  HPI  Patient is here today for follow up.  Her most recent labs are listed below: Lab on 01/01/2018  Component Date Value Ref Range Status  . WBC 01/01/2018 7.6  3.8 - 10.8 Thousand/uL Final  . RBC 01/01/2018 3.60* 3.80 - 5.10 Million/uL Final  . Hemoglobin 01/01/2018 10.6* 11.7 - 15.5 g/dL Final  . HCT 01/01/2018 31.1* 35.0 - 45.0 % Final  . MCV 01/01/2018 86.4  80.0 - 100.0 fL Final  . MCH 01/01/2018 29.4  27.0 - 33.0 pg Final  . MCHC 01/01/2018 34.1  32.0 - 36.0 g/dL Final  . RDW 01/01/2018 13.7  11.0 - 15.0 % Final  . Platelets 01/01/2018 177  140 - 400 Thousand/uL Final  . MPV 01/01/2018 10.8  7.5 - 12.5 fL Final  . Neutro Abs 01/01/2018 5,100  1,500 - 7,800 cells/uL Final  . Lymphs Abs 01/01/2018 1,604  850 - 3,900 cells/uL Final  . WBC mixed population 01/01/2018 471  200 - 950 cells/uL Final  . Eosinophils Absolute 01/01/2018 372  15 - 500 cells/uL Final  . Basophils Absolute 01/01/2018 53  0 - 200 cells/uL Final  . Neutrophils Relative % 01/01/2018 67.1  % Final  . Total Lymphocyte 01/01/2018 21.1  % Final  . Monocytes Relative 01/01/2018 6.2  % Final  . Eosinophils Relative 01/01/2018 4.9  % Final  . Basophils Relative 01/01/2018 0.7  % Final  . Cholesterol 01/01/2018 179  <200 mg/dL Final  . HDL 01/01/2018 34* >50 mg/dL Final  . Triglycerides 01/01/2018 193* <150 mg/dL Final  . LDL Cholesterol (Calc) 01/01/2018 114* mg/dL (calc) Final   Comment: Reference range: <100 . Desirable range <100 mg/dL for primary prevention;   <70 mg/dL for patients with CHD or diabetic patients  with > or = 2 CHD risk factors. Marland Kitchen LDL-C is now calculated using the Martin-Hopkins  calculation, which is a validated novel method providing  better accuracy than the Friedewald equation in the  estimation of LDL-C.  Cresenciano Genre et al. Annamaria Helling. 2330;076(22): 2061-2068    (http://education.[facility].com/faq/FAQ164)   . Total CHOL/HDL Ratio 01/01/2018 5.3* <5.0 (calc) Final  . Non-HDL Cholesterol (Calc) 01/01/2018 145* <130 mg/dL (calc) Final   Comment: For patients with diabetes plus 1 major ASCVD risk  factor, treating to a non-HDL-C goal of <100 mg/dL  (LDL-C of <70 mg/dL) is considered a therapeutic  option.   . Hgb A1c MFr Bld 01/01/2018 7.3* <5.7 % of total Hgb Final   Comment: For someone without known diabetes, a hemoglobin A1c value of 6.5% or greater indicates that they may have  diabetes and this should be confirmed with a follow-up  test. . For someone with known diabetes, a value <7% indicates  that their diabetes is well controlled and a value  greater than or equal to 7% indicates suboptimal  control. A1c targets should be individualized based on  duration of diabetes, age, comorbid conditions, and  other considerations. . Currently, no consensus exists regarding use of hemoglobin A1c for diagnosis of diabetes for children. .   . Mean Plasma Glucose 01/01/2018 163  (calc) Final  . eAG (mmol/L) 01/01/2018 9.0  (calc) Final  . Glucose, Bld 01/01/2018 74  65 - 99 mg/dL Final   Comment: .            Fasting reference interval .   . BUN 01/01/2018  58* 7 - 25 mg/dL Final  . Creat 01/01/2018 3.88* 0.50 - 0.99 mg/dL Final   Comment: For patients >99 years of age, the reference limit for Creatinine is approximately 13% higher for people identified as African-American. .   Havery Moros Ratio 01/01/2018 15  6 - 22 (calc) Final  . Sodium 01/01/2018 143  135 - 146 mmol/L Final  . Potassium 01/01/2018 5.3  3.5 - 5.3 mmol/L Final  . Chloride 01/01/2018 107  98 - 110 mmol/L Final  . CO2 01/01/2018 27  20 - 32 mmol/L Final  . Calcium 01/01/2018 8.7  8.6 - 10.4 mg/dL Final  . Total Protein 01/01/2018 6.8  6.1 - 8.1 g/dL Final  . Albumin 01/01/2018 3.9  3.6 - 5.1 g/dL Final  . Globulin 01/01/2018 2.9  1.9 - 3.7 g/dL (calc) Final   . AG Ratio 01/01/2018 1.3  1.0 - 2.5 (calc) Final  . Total Bilirubin 01/01/2018 0.4  0.2 - 1.2 mg/dL Final  . Alkaline phosphatase (APISO) 01/01/2018 117  33 - 130 U/L Final  . AST 01/01/2018 9* 10 - 35 U/L Final  . ALT 01/01/2018 7  6 - 29 U/L Final   Patient is here today for follow-up.  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  They obtained a CT scan to monitor for recurrence.  Although this was negative for any evidence of local recurrent disease or metastatic disease there was several coincidental findings.  First there was a 3.3 x 3 x 3.2 cm low-attenuation lesion in the left adnexal region.  They were uncertain of what this would be.  It was felt most likely to be an ovarian cyst however a pelvic ultrasound was recommended for further evaluation.  They also found atherosclerosis of the coronary arteries including calcified atherosclerotic plaque in the left main, left anterior descending, left circumflex, and right coronary arteries.  Patient previously has been asymptomatic.  She denies any chest pain.  She does have dyspnea on exertion.  She is extremely sedentary.  She states that the most activity she does on a daily basis is to walk approximately 6200 feet.  With this she does get occasionally short of breath.  Again she denies angina or any chest pain.  I reviewed her lab work with her.  Her hemoglobin A1c was outstanding for this patient given her history of noncompliance at 7.3.  However her LDL cholesterol was elevated particularly when one considers the findings recently discovered on her CT scan.  She is also not tolerating Lipitor as she reports problems with short-term memory loss on the medication.  Her sister states that she had similar symptoms on Lipitor previously that resolved when she stopped the medication.  The mild memory impairment did not begin until we switched her from pravastatin  to Lipitor. Past Medical History:  Diagnosis Date  . Anemia   . Arthritis   . Asthma   . CKD stage 3 due to type 2 diabetes mellitus ([place])   . Depression   . Diabetic retinopathy of both eyes ([place])   . GERD (gastroesophageal reflux disease)   . HLD (hyperlipidemia)   . Hypertension   . Microalbuminuria due to type 2 diabetes mellitus ([place])   . Neuromuscular disorder ([place])    diabetic neuropathy  . Pneumonia    hx of   . Renal cell cancer ([place])   . 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 BASCILIC VEIN TRANSPOSITION Left 08/08/2017   Procedure: BASILIC VEIN TRANSPOSITION FIRST STAGE LEFT ARM;  Surgeon: Serafina Mitchell, MD;  Location: [facility];  Service: Vascular;  Laterality: Left;  . ROBOTIC ADRENALECTOMY Left 03/10/2017   Procedure: XI ROBOTIC ADRENALECTOMY;  Surgeon: Nickie Retort, MD;  Location: [facility];  Service: Urology;  Laterality: Left;  . ROBOTIC ASSITED PARTIAL NEPHRECTOMY Right 03/10/2017   Procedure: XI ROBOTIC ASSITED RADICAL NEPHRECTOMY;  Surgeon: Nickie Retort, MD;  Location: [facility];  Service: Urology;  Laterality: Right;  . TOE AMPUTATION Bilateral    both great toe ,, left foot 2nd toe 1/2   Current Outpatient Medications on File Prior to Visit  Medication Sig Dispense Refill  . amLODipine (NORVASC) 10 MG tablet TAKE ONE TABLET BY MOUTH ONCE DAILY (Patient taking differently: TAKE ONE TABLET BY MOUTH ONCE IN THE EVENING) 90 tablet 3  . atorvastatin (LIPITOR) 40 MG tablet Take 1 tablet (40 mg total) by mouth daily. 90 tablet 1  . Blood Glucose Monitoring Suppl (ONE TOUCH ULTRA 2) w/Device KIT Use to check BS BID-QID Dx:E11.9 1 each 1  . BREO ELLIPTA 100-25 MCG/INH AEPB INHALE ONE PUFF BY MOUTH ONCE DAILY 60 each 11  . Dulaglutide (TRULICITY) 0.53 ZJ/6.7HA SOPN Inject 0.75 mg into the skin once a week. 5 pen 5  . ferrous gluconate (IRON 27) 240 (27 FE) MG tablet Take 240 mg by mouth  daily.    . furosemide (LASIX) 20 MG tablet TAKE 1 TABLET BY MOUTH ONCE DAILY 30 tablet 3  . gabapentin (NEURONTIN) 300 MG capsule Take 1 capsule (300 mg total) by mouth 3 (three) times daily. (Patient taking differently: Take 300 mg by mouth 3 (three) times daily as needed (pain). ) 90 capsule 2  . glucose blood (ONE TOUCH ULTRA TEST) test strip Use as instructed 300 each 3  . HYDROcodone-acetaminophen (NORCO) 5-325 MG tablet Take 1 tablet by mouth every 4 (four) hours as needed for moderate pain. 12 tablet 0  . Lancets (ONETOUCH ULTRASOFT) lancets Use as instructed 300 each 3  . liraglutide (VICTOZA) 18 MG/3ML SOPN Inject SQ 1.2 MG ONCE A DAY 6 pen 1  . polyethylene glycol (MIRALAX / GLYCOLAX) packet Take 17 g by mouth daily as needed for mild constipation.    Marland Kitchen PROAIR HFA 108 (90 Base) MCG/ACT inhaler INHALE TWO PUFFS INTO LUNGS EVERY 6 HOURS AS NEEDED FOR WHEEZING AND FOR SHORTNESS OF BREATH 9 each 4  . SODIUM BICARBONATE PO Take 1 tablet by mouth 2 (two) times daily.    . TRADJENTA 5 MG TABS tablet TAKE 1 TABLET BY MOUTH ONCE DAILY 90 tablet 2  . TRESIBA FLEXTOUCH 100 UNIT/ML SOPN FlexTouch Pen INJECT 150 UNITS SUBCUTANEOUSLY ONCE DAILY 45 mL 2  . vitamin A 10000 UNIT capsule Take 10,000 Units by mouth daily.    . vitamin A 8000 UNIT capsule Take 8,000 Units by mouth daily.     No current facility-administered medications on file prior to visit.    Allergies  Allergen Reactions  . Penicillins Other (See Comments)    From childhood Has patient had a PCN reaction causing immediate rash, facial/tongue/throat swelling, SOB or lightheadedness with hypotension: Unknown Has patient had a PCN reaction causing severe rash involving mucus membranes or skin necrosis: Unknown Has patient had a PCN reaction that required hospitalization: Unknown Has patient had a PCN reaction occurring within the last 10 years: Unknown If all of  the above answers are "NO", then may proceed with Cephalosporin use.     . Adhesive [Tape] Rash   Social History   Socioeconomic History  . Marital status: Single    Spouse name: Not on file  . Number of children: Not on file  . Years of education: Not on file  . Highest education level: Not on file  Occupational History  . Not on file  Social Needs  . Financial resource strain: Not on file  . Food insecurity:    Worry: Not on file    Inability: Not on file  . Transportation needs:    Medical: Not on file    Non-medical: Not on file  Tobacco Use  . Smoking status: Never Smoker  . Smokeless tobacco: Never Used  Substance and Sexual Activity  . Alcohol use: Yes    Alcohol/week: 0.0 oz    Comment: occ   . Drug use: No  . Sexual activity: Not Currently    Partners: Male  Lifestyle  . Physical activity:    Days per week: Not on file    Minutes per session: Not on file  . Stress: Not on file  Relationships  . Social connections:    Talks on phone: Not on file    Gets together: Not on file    Attends religious service: Not on file    Active member of club or organization: Not on file    Attends meetings of clubs or organizations: Not on file    Relationship status: Not on file  . Intimate partner violence:    Fear of current or ex partner: Not on file    Emotionally abused: Not on file    Physically abused: Not on file    Forced sexual activity: Not on file  Other Topics Concern  . Not on file  Social History Narrative  . Not on file     Review of Systems  All other systems reviewed and are negative.      Objective:   Physical Exam  Constitutional: She appears well-developed and well-nourished. No distress.  Cardiovascular: Normal rate, regular rhythm and normal heart sounds.  Pulmonary/Chest: Effort normal and breath sounds normal. No respiratory distress. She has no wheezes. She has no rales.  Abdominal: Soft. Bowel sounds are normal. She exhibits no distension. There is no tenderness. There is no rebound and no guarding.   Musculoskeletal: She exhibits edema.  Skin: She is not diaphoretic.  Vitals reviewed.         Assessment & Plan:   Diabetes mellitus, type II, insulin dependent (HCC)  CKD (chronic kidney disease) stage 5, GFR less than 15 ml/min (HCC)  Benign essential HTN  Anemia in stage 5 chronic kidney disease, not on chronic dialysis (HCC)  Hyperlipidemia, unspecified hyperlipidemia type  Coronary artery calcification seen on CAT scan  Adnexal cyst  I am very happy with the patient's hemoglobin A1c given her past noncompliance.  I will make no changes in her medication at this time regarding her diabetes.  However I do feel that there is room for improvement with regards to her hyperlipidemia.  I recommended discontinuation of Lipitor and switching the patient to Crestor 20 mg a day and rechecking a fasting lipid panel in 3 months.  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  Her anemia remains stable at greater than 10.  Her potassium remains stable.  Her blood pressure is acceptable.  I will consult cardiology regarding the findings  on the CT scan for possible stress test given her age, and her significant risk factors for coronary artery disease.  Furthermore given her sedentary nature, it is difficult to evaluate the patient for dyspnea on exertion and angina she performs very little physical activity.  I will also obtain an ultrasound of the pelvis to evaluate the left adnexal mass to determine if it is in fact an ovarian cyst.  If it is a simple cyst, we we will monitor it again in 3 months to ensure resolution.  If there are findings concerning for a complicated cyst, we may consult gynecology.

## 2018-01-09 ENCOUNTER — Other Ambulatory Visit: Payer: Self-pay | Admitting: Family Medicine

## 2018-01-09 DIAGNOSIS — N949 Unspecified condition associated with female genital organs and menstrual cycle: Secondary | ICD-10-CM

## 2018-01-19 ENCOUNTER — Other Ambulatory Visit: Payer: Self-pay | Admitting: Family Medicine

## 2018-01-19 ENCOUNTER — Ambulatory Visit
Admission: RE | Admit: 2018-01-19 | Discharge: 2018-01-19 | Disposition: A | Discharge status: Home / Self Care | Payer: Medicare Other | Source: Ambulatory Visit | Attending: Family Medicine | Admitting: Family Medicine

## 2018-01-19 DIAGNOSIS — N949 Unspecified condition associated with female genital organs and menstrual cycle: Secondary | ICD-10-CM

## 2018-01-27 ENCOUNTER — Other Ambulatory Visit: Payer: Self-pay | Admitting: Family Medicine

## 2018-01-27 DIAGNOSIS — N949 Unspecified condition associated with female genital organs and menstrual cycle: Secondary | ICD-10-CM

## 2018-02-17 ENCOUNTER — Encounter: Payer: Self-pay | Admitting: Cardiovascular Disease

## 2018-02-17 ENCOUNTER — Ambulatory Visit: Payer: Medicare Other | Admitting: Cardiovascular Disease

## 2018-02-17 DIAGNOSIS — I10 Essential (primary) hypertension: Secondary | ICD-10-CM | POA: Diagnosis not present

## 2018-02-17 DIAGNOSIS — I2510 Atherosclerotic heart disease of native coronary artery without angina pectoris: Secondary | ICD-10-CM

## 2018-02-17 DIAGNOSIS — E7800 Pure hypercholesterolemia, unspecified: Secondary | ICD-10-CM | POA: Diagnosis not present

## 2018-02-17 HISTORY — DX: Atherosclerotic heart disease of native coronary artery without angina pectoris: I25.10

## 2018-02-17 HISTORY — DX: Essential (primary) hypertension: I10

## 2018-02-17 NOTE — Assessment & Plan Note (Signed)
History of essential hypertension her blood pressure measured today 134/64.  She is on amlodipine.  Continue current meds at current dosing.

## 2018-02-17 NOTE — Patient Instructions (Signed)
Medication Instructions:  Your physician recommends that you continue on your current medications as directed. Please refer to the Current Medication list given to you today.   Labwork: none  Testing/Procedures: Your physician has requested that you have a lexiscan myoview. For further information please visit [email or web address]. Please follow instruction sheet, as given.  Your physician has requested that you have an echocardiogram. Echocardiography is a painless test that uses sound waves to create images of your heart. It provides your doctor with information about the size and shape of your heart and how well your heart's chambers and valves are working. This procedure takes approximately one hour. There are no restrictions for this procedure.    Follow-Up: Your physician recommends that you schedule a follow-up appointment in: 3 months with Dr. Gwenlyn Found.   Any Other Special Instructions Will Be Listed Below (If Applicable).     If you need a refill on your cardiac medications before your next appointment, please call your pharmacy.

## 2018-02-17 NOTE — Progress Notes (Signed)
02/17/2018 [name]   1951-01-04  [id number]  Primary Physician Pickard, Cammie Mcgee, MD Primary Cardiologist: Lorretta Harp MD [name], [place]  HPI:  [name] is a 67 y.o. moderately overweight single Caucasian female mother of 2, grandmother of 3 grandchildren company by her Sister [name] today.  She was referred by Dr. Dennard Schaumann for cardiovascular evaluation because of coronary calcification seen on CT scan performed 10/31/2017.  She does have history of treated diabetes, hypertension and hyperlipidemia.  She does have a strong family history with 2 brothers and her father all of whom had ischemic heart disease.  He is never had a heart attack or stroke.  He does complain of some back pain on exertion with dyspnea.  She had a nephrectomy in the past currently has CKD 4 with a dialysis fistula placed by Dr. Trula Slade recently in anticipation of hemodialysis.   Current Meds  Medication Sig  . amLODipine (NORVASC) 10 MG tablet TAKE ONE TABLET BY MOUTH ONCE DAILY (Patient taking differently: TAKE ONE TABLET BY MOUTH ONCE IN THE EVENING)  . Blood Glucose Monitoring Suppl (ONE TOUCH ULTRA 2) w/Device KIT Use to check BS BID-QID Dx:E11.9  . BREO ELLIPTA 100-25 MCG/INH AEPB INHALE ONE PUFF BY MOUTH ONCE DAILY  . Dulaglutide (TRULICITY) 4.35 WY/6.1UO SOPN Inject 0.75 mg into the skin once a week.  . furosemide (LASIX) 20 MG tablet TAKE 1 TABLET BY MOUTH ONCE DAILY  . gabapentin (NEURONTIN) 300 MG capsule Take 1 capsule (300 mg total) by mouth 3 (three) times daily. (Patient taking differently: Take 300 mg by mouth 3 (three) times daily as needed (pain). )  . glucose blood (ONE TOUCH ULTRA TEST) test strip Use as instructed  . Lancets (ONETOUCH ULTRASOFT) lancets Use as instructed  . polyethylene glycol (MIRALAX / GLYCOLAX) packet Take 17 g by mouth daily as needed for mild constipation.  Marland Kitchen PROAIR HFA 108 (90 Base) MCG/ACT inhaler INHALE TWO PUFFS INTO LUNGS EVERY 6 HOURS AS NEEDED  FOR WHEEZING AND FOR SHORTNESS OF BREATH  . rosuvastatin (CRESTOR) 20 MG tablet Take 1 tablet (20 mg total) by mouth daily.  . SODIUM BICARBONATE PO Take 1 tablet by mouth 2 (two) times daily.  . TRADJENTA 5 MG TABS tablet TAKE 1 TABLET BY MOUTH ONCE DAILY  . TRESIBA FLEXTOUCH 100 UNIT/ML SOPN FlexTouch Pen INJECT 150 UNITS SUBCUTANEOUSLY ONCE DAILY  . vitamin A 10000 UNIT capsule Take 10,000 Units by mouth daily.  . vitamin A 8000 UNIT capsule Take 8,000 Units by mouth daily.     Allergies  Allergen Reactions  . Penicillins Other (See Comments)    From childhood Has patient had a PCN reaction causing immediate rash, facial/tongue/throat swelling, SOB or lightheadedness with hypotension: Unknown Has patient had a PCN reaction causing severe rash involving mucus membranes or skin necrosis: Unknown Has patient had a PCN reaction that required hospitalization: Unknown Has patient had a PCN reaction occurring within the last 10 years: Unknown If all of the above answers are "NO", then may proceed with Cephalosporin use.   . Adhesive [Tape] Rash    Social History   Socioeconomic History  . Marital status: Single    Spouse name: Not on file  . Number of children: Not on file  . Years of education: Not on file  . Highest education level: Not on file  Occupational History  . Not on file  Social Needs  . Financial resource strain: Not on file  .  Food insecurity:    Worry: Not on file    Inability: Not on file  . Transportation needs:    Medical: Not on file    Non-medical: Not on file  Tobacco Use  . Smoking status: Never Smoker  . Smokeless tobacco: Never Used  Substance and Sexual Activity  . Alcohol use: Yes    Alcohol/week: 0.0 oz    Comment: occ   . Drug use: No  . Sexual activity: Not Currently    Partners: Male  Lifestyle  . Physical activity:    Days per week: Not on file    Minutes per session: Not on file  . Stress: Not on file  Relationships  . Social  connections:    Talks on phone: Not on file    Gets together: Not on file    Attends religious service: Not on file    Active member of club or organization: Not on file    Attends meetings of clubs or organizations: Not on file    Relationship status: Not on file  . Intimate partner violence:    Fear of current or ex partner: Not on file    Emotionally abused: Not on file    Physically abused: Not on file    Forced sexual activity: Not on file  Other Topics Concern  . Not on file  Social History Narrative  . Not on file     Review of Systems: General: negative for chills, fever, night sweats or weight changes.  Cardiovascular: negative for chest pain, dyspnea on exertion, edema, orthopnea, palpitations, paroxysmal nocturnal dyspnea or shortness of breath Dermatological: negative for rash Respiratory: negative for cough or wheezing Urologic: negative for hematuria Abdominal: negative for nausea, vomiting, diarrhea, bright red blood per rectum, melena, or hematemesis Neurologic: negative for visual changes, syncope, or dizziness All other systems reviewed and are otherwise negative except as noted above.    Blood pressure 134/64, pulse 85, height '5\' 7"'$  (1.702 m), weight 255 lb (115.7 kg).  General appearance: alert and no distress Neck: no adenopathy, no carotid bruit, no JVD, supple, symmetrical, trachea midline and thyroid not enlarged, symmetric, no tenderness/mass/nodules Lungs: clear to auscultation bilaterally Heart: regular rate and rhythm, S1, S2 normal, no murmur, click, rub or gallop Extremities: extremities normal, atraumatic, no cyanosis or edema Pulses: Diminished pedal pulses Skin: Skin color, texture, turgor normal. No rashes or lesions Neurologic: Alert and oriented X 3, normal strength and tone. Normal symmetric reflexes. Normal coordination and gait  EKG sinus rhythm at 85 ST and T wave changes, left axis deviation with poor R wave R wave progression and  nonspecific IVCD.  I personally reviewed this EKG.  ASSESSMENT AND PLAN:   Hyperlipidemia History of hyperlipidemia on statin therapy with lipid profile performed 01/01/2018 revealing a total cholesterol 179 level , triglyceride level of 193, and HDL of 34.  Essential hypertension History of essential hypertension her blood pressure measured today 134/64.  She is on amlodipine.  Continue current meds at current dosing.  Coronary artery calcification seen on CAT scan Ms. Broberg was referred by Dr. Dennard Schaumann because of coronary calcification seen on CT scan performed 10/31/2017.  There is calcification in the left main and all 3 coronary arteries.  She does have multiple risk factors with symptoms of dyspnea and chest pain.  She also has chronic renal insufficiency and has an AV fistula in place in preparation for hemodialysis.  I am going to get a 2D echo and a formic logic  Myoview stress test that she is unable to exercise to further evaluate her.      Lorretta Harp MD FACP,FACC,FAHA, [facility] 02/17/2018 11:13 AM

## 2018-02-17 NOTE — Assessment & Plan Note (Signed)
[name] was referred by Dr. Dennard Schaumann because of coronary calcification seen on CT scan performed 10/31/2017.  There is calcification in the left main and all 3 coronary arteries.  She does have multiple risk factors with symptoms of dyspnea and chest pain.  She also has chronic renal insufficiency and has an AV fistula in place in preparation for hemodialysis.  I am going to get a 2D echo and a formic logic Myoview stress test that she is unable to exercise to further evaluate her.

## 2018-02-17 NOTE — Assessment & Plan Note (Addendum)
History of hyperlipidemia on statin therapy with lipid profile performed 01/01/2018 revealing a total cholesterol 179 level , triglyceride level of 193, and HDL of 34.

## 2018-02-19 ENCOUNTER — Telehealth (HOSPITAL_COMMUNITY): Payer: Self-pay

## 2018-02-19 NOTE — Telephone Encounter (Signed)
Encounter complete. 

## 2018-02-24 ENCOUNTER — Ambulatory Visit (HOSPITAL_COMMUNITY)
Admission: RE | Admit: 2018-02-24 | Discharge: 2018-02-24 | Disposition: A | Discharge status: Home / Self Care | Payer: Medicare Other | Source: Ambulatory Visit | Attending: Cardiology | Admitting: Cardiology

## 2018-02-24 DIAGNOSIS — E7800 Pure hypercholesterolemia, unspecified: Secondary | ICD-10-CM

## 2018-02-24 DIAGNOSIS — I10 Essential (primary) hypertension: Secondary | ICD-10-CM | POA: Diagnosis not present

## 2018-02-24 DIAGNOSIS — I2510 Atherosclerotic heart disease of native coronary artery without angina pectoris: Secondary | ICD-10-CM | POA: Diagnosis not present

## 2018-02-24 MED ORDER — TECHNETIUM TC 99M TETROFOSMIN IV KIT
31.2000 | PACK | Freq: Once | INTRAVENOUS | Status: AC | PRN
  Administered 2018-02-24: 31.2 via INTRAVENOUS
  Filled 2018-02-24: qty 32

## 2018-02-24 MED ORDER — REGADENOSON 0.4 MG/5ML IV SOLN
0.4000 mg | Freq: Once | INTRAVENOUS | Status: AC
  Administered 2018-02-24: 0.4 mg via INTRAVENOUS

## 2018-02-25 ENCOUNTER — Ambulatory Visit (HOSPITAL_COMMUNITY)
Admission: RE | Admit: 2018-02-25 | Discharge: 2018-02-25 | Disposition: A | Discharge status: Home / Self Care | Payer: Medicare Other | Source: Ambulatory Visit | Attending: Internal Medicine | Admitting: Internal Medicine

## 2018-02-25 LAB — MYOCARDIAL PERFUSION IMAGING
LV dias vol: 183 mL (ref 46–106)
LV sys vol: 105 mL
Peak HR: 91 {beats}/min
Rest HR: 81 {beats}/min
SDS: 2
SRS: 4
SSS: 6
TID: 0.89

## 2018-02-25 MED ORDER — TECHNETIUM TC 99M TETROFOSMIN IV KIT
30.9000 | PACK | Freq: Once | INTRAVENOUS | Status: AC | PRN
  Administered 2018-02-25: 30.9 via INTRAVENOUS

## 2018-02-26 ENCOUNTER — Other Ambulatory Visit: Payer: Self-pay | Admitting: Family Medicine

## 2018-03-02 ENCOUNTER — Ambulatory Visit (HOSPITAL_COMMUNITY): Payer: Medicare Other | Attending: Cardiology

## 2018-03-02 ENCOUNTER — Other Ambulatory Visit: Payer: Self-pay

## 2018-03-02 DIAGNOSIS — I10 Essential (primary) hypertension: Secondary | ICD-10-CM | POA: Diagnosis not present

## 2018-03-02 DIAGNOSIS — E785 Hyperlipidemia, unspecified: Secondary | ICD-10-CM | POA: Diagnosis not present

## 2018-03-02 DIAGNOSIS — E119 Type 2 diabetes mellitus without complications: Secondary | ICD-10-CM | POA: Insufficient documentation

## 2018-03-02 DIAGNOSIS — I2510 Atherosclerotic heart disease of native coronary artery without angina pectoris: Secondary | ICD-10-CM | POA: Insufficient documentation

## 2018-03-02 DIAGNOSIS — I2584 Coronary atherosclerosis due to calcified coronary lesion: Secondary | ICD-10-CM | POA: Diagnosis not present

## 2018-03-02 DIAGNOSIS — E7800 Pure hypercholesterolemia, unspecified: Secondary | ICD-10-CM

## 2018-03-02 MED ORDER — PERFLUTREN LIPID MICROSPHERE
1.0000 mL | INTRAVENOUS | Status: AC | PRN
  Administered 2018-03-02: 2 mL via INTRAVENOUS

## 2018-03-03 ENCOUNTER — Encounter: Payer: Self-pay | Admitting: Cardiovascular Disease

## 2018-03-03 NOTE — Telephone Encounter (Signed)
New Message ° ° ° °Patient is returning call in reference to echocardiogram results. Please call.  °

## 2018-03-03 NOTE — Telephone Encounter (Signed)
This encounter was created in error - please disregard.

## 2018-03-04 ENCOUNTER — Other Ambulatory Visit: Payer: Self-pay | Admitting: Family Medicine

## 2018-03-04 ENCOUNTER — Other Ambulatory Visit: Payer: Self-pay | Admitting: *Deleted

## 2018-03-04 DIAGNOSIS — N184 Chronic kidney disease, stage 4 (severe): Secondary | ICD-10-CM | POA: Diagnosis not present

## 2018-03-04 DIAGNOSIS — D631 Anemia in chronic kidney disease: Secondary | ICD-10-CM | POA: Diagnosis not present

## 2018-03-04 DIAGNOSIS — C649 Malignant neoplasm of unspecified kidney, except renal pelvis: Secondary | ICD-10-CM | POA: Diagnosis not present

## 2018-03-04 DIAGNOSIS — E1122 Type 2 diabetes mellitus with diabetic chronic kidney disease: Secondary | ICD-10-CM | POA: Diagnosis not present

## 2018-03-04 DIAGNOSIS — N2581 Secondary hyperparathyroidism of renal origin: Secondary | ICD-10-CM | POA: Diagnosis not present

## 2018-03-04 DIAGNOSIS — I10 Essential (primary) hypertension: Secondary | ICD-10-CM

## 2018-03-04 MED ORDER — DULAGLUTIDE 0.75 MG/0.5ML ~~LOC~~ SOAJ: 0.7500 mg | SUBCUTANEOUS | 5 refills | Status: DC

## 2018-03-25 ENCOUNTER — Other Ambulatory Visit: Payer: Self-pay | Admitting: Family Medicine

## 2018-04-02 ENCOUNTER — Other Ambulatory Visit: Payer: Medicare Other

## 2018-04-02 DIAGNOSIS — E119 Type 2 diabetes mellitus without complications: Secondary | ICD-10-CM | POA: Diagnosis not present

## 2018-04-02 DIAGNOSIS — E785 Hyperlipidemia, unspecified: Secondary | ICD-10-CM

## 2018-04-02 DIAGNOSIS — Z794 Long term (current) use of insulin: Secondary | ICD-10-CM

## 2018-04-03 LAB — COMPREHENSIVE METABOLIC PANEL
AG Ratio: 1.5 (calc) (ref 1.0–2.5)
ALT: 7 U/L (ref 6–29)
AST: 10 U/L (ref 10–35)
Albumin: 3.9 g/dL (ref 3.6–5.1)
Alkaline phosphatase (APISO): 110 U/L (ref 33–130)
BUN/Creatinine Ratio: 15 (calc) (ref 6–22)
BUN: 57 mg/dL — ABNORMAL HIGH (ref 7–25)
CO2: 24 mmol/L (ref 20–32)
Calcium: 8.4 mg/dL — ABNORMAL LOW (ref 8.6–10.4)
Chloride: 108 mmol/L (ref 98–110)
Creat: 3.75 mg/dL — ABNORMAL HIGH (ref 0.50–0.99)
Globulin: 2.6 g/dL (calc) (ref 1.9–3.7)
Glucose, Bld: 113 mg/dL — ABNORMAL HIGH (ref 65–99)
Potassium: 4.7 mmol/L (ref 3.5–5.3)
Sodium: 143 mmol/L (ref 135–146)
Total Bilirubin: 0.4 mg/dL (ref 0.2–1.2)
Total Protein: 6.5 g/dL (ref 6.1–8.1)

## 2018-04-03 LAB — LIPID PANEL
Cholesterol: 115 mg/dL (ref <200)
HDL: 30 mg/dL — ABNORMAL LOW (ref >50)
LDL Cholesterol (Calc): 64 mg/dL (calc)
Non-HDL Cholesterol (Calc): 85 mg/dL (calc) (ref <130)
Total CHOL/HDL Ratio: 3.8 (calc) (ref <5.0)
Triglycerides: 133 mg/dL (ref <150)

## 2018-04-03 LAB — HEMOGLOBIN A1C
Hgb A1c MFr Bld: 7.3 % of total Hgb — ABNORMAL HIGH (ref <5.7)
Mean Plasma Glucose: 163 (calc)
eAG (mmol/L): 9 (calc)

## 2018-04-07 ENCOUNTER — Encounter: Payer: Self-pay | Admitting: Cardiovascular Disease

## 2018-04-07 ENCOUNTER — Ambulatory Visit: Payer: Medicare Other | Admitting: Cardiovascular Disease

## 2018-04-07 DIAGNOSIS — I519 Heart disease, unspecified: Secondary | ICD-10-CM

## 2018-04-07 DIAGNOSIS — I2510 Atherosclerotic heart disease of native coronary artery without angina pectoris: Secondary | ICD-10-CM | POA: Diagnosis not present

## 2018-04-07 HISTORY — DX: Heart disease, unspecified: I51.9

## 2018-04-07 MED ORDER — CARVEDILOL 3.125 MG PO TABS: 3.1250 mg | ORAL_TABLET | Freq: Two times a day (BID) | ORAL | 3 refills | Status: DC

## 2018-04-07 NOTE — Progress Notes (Signed)
[name] returns today for follow-up of her 2D echo Myoview stress test which revealed EF of 40% without significant valvular abnormalities and mild LVH with grade 1 diastolic dysfunction.  Myoview stress test showed no ischemia.  She does complain of occasional heart flutters.  I go to start her on low-dose carvedilol will have Kristen titrate this up blood pressure permitting up.  I will see her back in 3 months.  Lorretta Harp, M.D., [place], [facility], [place] [address]. [place], [place]  [id number]  [phone] 04/07/2018 12:00 PM

## 2018-04-07 NOTE — Patient Instructions (Signed)
Medication Instructions:  Your physician has recommended you make the following change in your medication:  1) START Coreg 3.125 by mouth TWICE daily   Labwork: none  Testing/Procedures: none  Follow-Up: Your physician recommends that you schedule a follow-up appointment in: 1 month in [facility]  Your physician recommends that you schedule a follow-up appointment in: 3 months with Dr. Gwenlyn Found.   Any Other Special Instructions Will Be Listed Below (If Applicable).     If you need a refill on your cardiac medications before your next appointment, please call your pharmacy.

## 2018-04-07 NOTE — Assessment & Plan Note (Signed)
Ms. Kuehnel returns today for follow-up of her Myoview stress test performed in evaluation of coronary calcification on chest CT 02/24/2018.  There were no ischemic abnormalities noted.

## 2018-04-07 NOTE — Assessment & Plan Note (Signed)
[name] had a Myoview and 2D echo which both showed an EF in the 40% range for unclear reasons.  I suspect this is multifactorial related to hypertension and diabetes.  I am going to begin her low-dose carvedilol and have Kristen titrate this up of the next 3 months.  This will edition treat her palpitations.  I will see her back after that for follow-up.

## 2018-04-10 ENCOUNTER — Encounter: Payer: Self-pay | Admitting: Family Medicine

## 2018-04-10 ENCOUNTER — Ambulatory Visit (INDEPENDENT_AMBULATORY_CARE_PROVIDER_SITE_OTHER): Payer: Medicare Other | Admitting: Family Medicine

## 2018-04-10 ENCOUNTER — Ambulatory Visit: Payer: Medicare Other | Admitting: Family Medicine

## 2018-04-10 VITALS — BP 122/60 | HR 72 | Temp 97.6°F | Resp 18 | Ht 67.0 in | Wt 253.0 lb

## 2018-04-10 DIAGNOSIS — Z23 Encounter for immunization: Secondary | ICD-10-CM

## 2018-04-10 DIAGNOSIS — N185 Chronic kidney disease, stage 5: Secondary | ICD-10-CM

## 2018-04-10 DIAGNOSIS — E119 Type 2 diabetes mellitus without complications: Secondary | ICD-10-CM

## 2018-04-10 DIAGNOSIS — I10 Essential (primary) hypertension: Secondary | ICD-10-CM

## 2018-04-10 DIAGNOSIS — E785 Hyperlipidemia, unspecified: Secondary | ICD-10-CM

## 2018-04-10 DIAGNOSIS — D631 Anemia in chronic kidney disease: Secondary | ICD-10-CM

## 2018-04-10 DIAGNOSIS — Z794 Long term (current) use of insulin: Secondary | ICD-10-CM

## 2018-04-10 NOTE — Progress Notes (Signed)
Subjective:    Patient ID: [name], female    DOB: Jul 13, 1951, 67 y.o.   MRN: [id number]  HPI  Patient is here today for follow up. Patient is here today for follow-up.  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  They obtained a CT scan to monitor for recurrence.  Although this was negative for any evidence of local recurrent disease or metastatic disease there was several coincidental findings.  First there was a 3.3 x 3 x 3.2 cm low-attenuation lesion in the left adnexal region.  They were uncertain of what this would be.  It was felt most likely to be an ovarian cyst however a pelvic ultrasound was recommended for further evaluation.  They also found atherosclerosis of the coronary arteries including calcified atherosclerotic plaque in the left main, left anterior descending, left circumflex, and right coronary arteries.  Patient previously has been asymptomatic.  She denies any chest pain.  She does have dyspnea on exertion.  She is extremely sedentary.  She states that the most activity she does on a daily basis is to walk approximately 6200 feet.  With this she does get occasionally short of breath.  Again she denies angina or any chest pain.  I reviewed her lab work with her.  Her hemoglobin A1c was outstanding for this patient given her history of noncompliance at 7.3.  However her LDL cholesterol was elevated particularly when one considers the findings recently discovered on her CT scan.  She is also not tolerating Lipitor as she reports problems with short-term memory loss on the medication.  Her sister states that she had similar symptoms on Lipitor previously that resolved when she stopped the medication.  The mild memory impairment did not begin until we switched her from pravastatin to Lipitor.  At that time, my plan was: I am very happy with the patient's hemoglobin A1c given her past  noncompliance.  I will make no changes in her medication at this time regarding her diabetes.  However I do feel that there is room for improvement with regards to her hyperlipidemia.  I recommended discontinuation of Lipitor and switching the patient to Crestor 20 mg a day and rechecking a fasting lipid panel in 3 months.  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  Her anemia remains stable at greater than 10.  Her potassium remains stable.  Her blood pressure is acceptable.  I will consult cardiology regarding the findings on the CT scan for possible stress test given her age, and her significant risk factors for coronary artery disease.  Furthermore given her sedentary nature, it is difficult to evaluate the patient for dyspnea on exertion and angina she performs very little physical activity.  I will also obtain an ultrasound of the pelvis to evaluate the left adnexal mass to determine if it is in fact an ovarian cyst.  If it is a simple cyst, we we will monitor it again in 3 months to ensure resolution.  If there are findings concerning for a complicated cyst, we may consult gynecology.  04/10/18 [place] was benign- 3.2 cm left adnexal cyst. No concerning features by ultrasound. While this is almost certainly benign, a follow up ultrasound is recommended in 1 year  Patient had a normal stress test however echocardiogram revealed an ejection fraction of 40%.  Cardiology has started the patient on carvedilol.  She is not currently on an ACE inhibitor or  an angiotensin receptor blocker due to the severity of her chronic kidney disease.  She is being followed every 3 to 4 months by her nephrologist Dr. Justin Mend.  Her blood pressure today is well controlled at 122/60.  She denies any chest pain shortness of breath or dyspnea on exertion.  On her recent lab work, her cholesterol is excellent.  She continues to maintain a GFR between 10 and 15 mL of blood per minute.  She has anemia associated with her  chronic kidney disease.  She denies any polyuria, polydipsia, or blurry vision.  She is not regularly checking her blood sugars.  She is taking [place] as directed in addition to Trulicity and tradjenta.  Her hemoglobin A1c is much improved from her previous baseline at 7.3 however it still not ideal.  The problem is the patient's lack of eating a consistent diet.  She frequently skips meals.  When she does eat she tends to make poor dietary choices.  For instance, she often drinks sweet tea or Sprite.  She will buy cookies.  She will eat [place] fries.  She will eat mashed potatoes.  We spent more than 20 minutes today discussing her options.  Her options include continuing on her current path and excepting this is the best control of her diabetes we can achieve, adding mealtime insulin that would require her to monitor her sugars 3 times a day and take correction dosing of insulin such as Humalog which I believe she would be a poor candidate for given her noncompliance, or having her eat a more consistent low-carb diet.  I tried to encourage the patient to discontinue sweet tea and soda altogether.  Also recommended that she give up junk food and high carbohydrate containing foods such as mashed potatoes and [place] fries.   Past Medical History:  Diagnosis Date  . Anemia   . Arthritis   . Asthma   . CKD stage 3 due to type 2 diabetes mellitus ([place])   . Depression   . Diabetic retinopathy of both eyes ([place])   . GERD (gastroesophageal reflux disease)   . HLD (hyperlipidemia)   . Hypertension   . Microalbuminuria due to type 2 diabetes mellitus ([place])   . Neuromuscular disorder ([place])    diabetic neuropathy  . Pneumonia    hx of   . Renal cell cancer ([place])   . 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 BASCILIC VEIN TRANSPOSITION Left 08/08/2017   Procedure: BASILIC VEIN TRANSPOSITION FIRST STAGE LEFT ARM;  Surgeon: Serafina Mitchell, MD;  Location: [facility];  Service: Vascular;  Laterality: Left;  . ROBOTIC ADRENALECTOMY Left 03/10/2017   Procedure: XI ROBOTIC ADRENALECTOMY;  Surgeon: Nickie Retort, MD;  Location: [facility];  Service: Urology;  Laterality: Left;  . ROBOTIC ASSITED PARTIAL NEPHRECTOMY Right 03/10/2017   Procedure: XI ROBOTIC ASSITED RADICAL NEPHRECTOMY;  Surgeon: Nickie Retort, MD;  Location: [facility];  Service: Urology;  Laterality: Right;  . TOE AMPUTATION Bilateral    both great toe ,, left foot 2nd toe 1/2   Current Outpatient Medications on File Prior to Visit  Medication Sig Dispense Refill  . amLODipine (NORVASC) 10 MG tablet TAKE ONE TABLET BY MOUTH ONCE DAILY (Patient taking differently: TAKE ONE TABLET BY MOUTH ONCE IN THE EVENING) 90 tablet 3  . Blood Glucose Monitoring Suppl (ONE TOUCH ULTRA 2) w/Device KIT Use to check BS BID-QID Dx:E11.9 1  each 1  . BREO ELLIPTA 100-25 MCG/INH AEPB INHALE ONE PUFF BY MOUTH ONCE DAILY 60 each 11  . calcitRIOL (ROCALTROL) 0.25 MCG capsule Take 0.25 mcg by mouth daily.    . carvedilol (COREG) 3.125 MG tablet Take 1 tablet (3.125 mg total) by mouth 2 (two) times daily. 180 tablet 3  . Dulaglutide (TRULICITY) 4.40 HK/7.4QV SOPN Inject 0.75 mg into the skin once a week. 5 pen 5  . furosemide (LASIX) 20 MG tablet TAKE 1 TABLET BY MOUTH ONCE DAILY 90 tablet 1  . gabapentin (NEURONTIN) 300 MG capsule TAKE 1 CAPSULE BY MOUTH THREE TIMES DAILY 90 capsule 2  . glucose blood (ONE TOUCH ULTRA TEST) test strip Use as instructed 300 each 3  . Lancets (ONETOUCH ULTRASOFT) lancets Use as instructed 300 each 3  . polyethylene glycol (MIRALAX / GLYCOLAX) packet Take 17 g by mouth daily as needed for mild constipation.    Marland Kitchen PROAIR HFA 108 (90 Base) MCG/ACT inhaler INHALE TWO PUFFS INTO LUNGS EVERY 6 HOURS AS NEEDED FOR WHEEZING AND FOR SHORTNESS OF BREATH 9 each 4  . rosuvastatin (CRESTOR) 20 MG tablet Take 1 tablet (20 mg total) by mouth daily. 90 tablet 3  . SODIUM  BICARBONATE PO Take 1 tablet by mouth 2 (two) times daily.    . TRADJENTA 5 MG TABS tablet TAKE 1 TABLET BY MOUTH ONCE DAILY 90 tablet 2  . TRESIBA FLEXTOUCH 100 UNIT/ML SOPN FlexTouch Pen INJECT 150 UNITS SUBCUTANEOUSLY ONCE DAILY (Patient taking differently: Inject 140 Units into the skin daily. ) 45 mL 2  . VITAMIN A PO Take 2,400 Units by mouth daily.     No current facility-administered medications on file prior to visit.    Allergies  Allergen Reactions  . Penicillins Other (See Comments)    From childhood Has patient had a PCN reaction causing immediate rash, facial/tongue/throat swelling, SOB or lightheadedness with hypotension: Unknown Has patient had a PCN reaction causing severe rash involving mucus membranes or skin necrosis: Unknown Has patient had a PCN reaction that required hospitalization: Unknown Has patient had a PCN reaction occurring within the last 10 years: Unknown If all of the above answers are "NO", then may proceed with Cephalosporin use.   . Adhesive [Tape] Rash   Social History   Socioeconomic History  . Marital status: Single    Spouse name: Not on file  . Number of children: Not on file  . Years of education: Not on file  . Highest education level: Not on file  Occupational History  . Not on file  Social Needs  . Financial resource strain: Not on file  . Food insecurity:    Worry: Not on file    Inability: Not on file  . Transportation needs:    Medical: Not on file    Non-medical: Not on file  Tobacco Use  . Smoking status: Never Smoker  . Smokeless tobacco: Never Used  Substance and Sexual Activity  . Alcohol use: Yes    Alcohol/week: 0.0 standard drinks    Comment: occ   . Drug use: No  . Sexual activity: Not Currently    Partners: Male  Lifestyle  . Physical activity:    Days per week: Not on file    Minutes per session: Not on file  . Stress: Not on file  Relationships  . Social connections:    Talks on phone: Not on file     Gets together: Not on file    Attends  religious service: Not on file    Active member of club or organization: Not on file    Attends meetings of clubs or organizations: Not on file    Relationship status: Not on file  . Intimate partner violence:    Fear of current or ex partner: Not on file    Emotionally abused: Not on file    Physically abused: Not on file    Forced sexual activity: Not on file  Other Topics Concern  . Not on file  Social History Narrative  . Not on file     Review of Systems  All other systems reviewed and are negative.      Objective:   Physical Exam  Constitutional: She appears well-developed and well-nourished. No distress.  Cardiovascular: Normal rate, regular rhythm and normal heart sounds.  Pulmonary/Chest: Effort normal and breath sounds normal. No respiratory distress. She has no wheezes. She has no rales.  Abdominal: Soft. Bowel sounds are normal. She exhibits no distension. There is no tenderness. There is no rebound and no guarding.  Musculoskeletal: She exhibits edema.  Skin: She is not diaphoretic.  Vitals reviewed.         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  She has a new diagnosis of congestive heart failure and she is currently on a beta-blocker.  She is unable to tolerate an ACE inhibitor or an angiotensin receptor blocker due to the precarious nature of her chronic kidney disease.  She may be a candidate for BiDil in the future however at the present time I will make no changes in her antihypertensives.  She is following up every 3 to 4  months with her nephrologist who is monitoring her chronic kidney disease and the associated anemia and hyperparathyroidism.  Her cholesterol is well controlled.  Her diabetes is fairly well controlled.  I strongly encouraged the patient to give up junk food, not skip meals, eat consistently a low carbohydrate diet and I believe if she does that we can achieve a goal hemoglobin A1c less than 7.  I recommended a flu shot but she declined

## 2018-04-20 ENCOUNTER — Other Ambulatory Visit: Payer: Self-pay | Admitting: *Deleted

## 2018-04-20 ENCOUNTER — Ambulatory Visit (INDEPENDENT_AMBULATORY_CARE_PROVIDER_SITE_OTHER): Payer: Medicare Other | Admitting: Surgery

## 2018-04-20 ENCOUNTER — Encounter: Payer: Self-pay | Admitting: *Deleted

## 2018-04-20 ENCOUNTER — Other Ambulatory Visit: Payer: Self-pay

## 2018-04-20 ENCOUNTER — Encounter: Payer: Self-pay | Admitting: Surgery

## 2018-04-20 VITALS — BP 131/75 | HR 74 | Temp 97.4°F | Resp 16 | Ht 67.0 in | Wt 252.0 lb

## 2018-04-20 DIAGNOSIS — N184 Chronic kidney disease, stage 4 (severe): Secondary | ICD-10-CM

## 2018-04-20 NOTE — H&P (View-Only) (Signed)
[facility]ist of   Patient name: [name] MRN: [id number] DOB: Apr 21, 1951 Sex: female   REASON FOR VISIT:    Follow up  HISOTRY OF PRESENT ILLNESS:    [name] is a 67 y.o. female who returns for follow-up.  She is status post left first stage basilic vein fistula creation on 08/08/2017.  She has missed her postoperative appointment.  She is not yet on dialysis.  She denies any pain in her left arm or hand   PAST MEDICAL HISTORY:   Past Medical History:  Diagnosis Date  . Anemia   . Arthritis   . Asthma   . CKD stage 3 due to type 2 diabetes mellitus ([place])   . Depression   . Diabetic retinopathy of both eyes ([place])   . GERD (gastroesophageal reflux disease)   . HLD (hyperlipidemia)   . Hypertension   . Microalbuminuria due to type 2 diabetes mellitus ([place])   . Neuromuscular disorder ([place])    diabetic neuropathy  . Pneumonia    hx of   . Renal cell cancer ([place])   . 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 Heart disease Mother   . Diabetes Sister     SOCIAL HISTORY:   Social History   Tobacco Use  . Smoking status: Never Smoker  . Smokeless tobacco: Never Used  Substance Use Topics  . Alcohol use: Yes    Alcohol/week: 0.0 standard drinks    Comment: occ      ALLERGIES:   Allergies  Allergen Reactions  . Penicillins Other (See Comments)    From childhood Has patient had a PCN reaction causing immediate rash, facial/tongue/throat swelling, SOB or lightheadedness with hypotension: Unknown Has patient had a PCN reaction causing severe rash involving mucus membranes or skin necrosis: Unknown Has patient had a PCN reaction that required hospitalization: Unknown Has patient had a PCN reaction occurring within the last 10 years: Unknown If all of the above answers are "NO", then may proceed with  Cephalosporin use.   . Adhesive [Tape] Rash     CURRENT MEDICATIONS:   Current Outpatient Medications  Medication Sig Dispense Refill  . amLODipine (NORVASC) 10 MG tablet TAKE ONE TABLET BY MOUTH ONCE DAILY (Patient taking differently: TAKE ONE TABLET BY MOUTH ONCE IN THE EVENING) 90 tablet 3  . Blood Glucose Monitoring Suppl (ONE TOUCH ULTRA 2) w/Device KIT Use to check BS BID-QID Dx:E11.9 1 each 1  . BREO ELLIPTA 100-25 MCG/INH AEPB INHALE ONE PUFF BY MOUTH ONCE DAILY 60 each 11  . calcitRIOL (ROCALTROL) 0.25 MCG capsule Take 0.25 mcg by mouth daily.    . carvedilol (COREG) 3.125 MG tablet Take 1 tablet (3.125 mg total) by mouth 2 (two) times daily. 180 tablet 3  . CINNA[place] PO Take by mouth daily.    . Dulaglutide (TRULICITY) 0.10 XN/2.3FT SOPN Inject 0.75 mg into the skin once a week. 5 pen 5  . furosemide (LASIX) 20 MG tablet TAKE 1 TABLET BY MOUTH ONCE DAILY 90 tablet 1  . gabapentin (NEURONTIN) 300 MG capsule TAKE 1 CAPSULE BY MOUTH THREE TIMES DAILY 90 capsule 2  . glucose blood (ONE TOUCH ULTRA TEST) test strip Use as instructed 300 each 3  . Lancets (ONETOUCH ULTRASOFT) lancets Use as instructed 300 each 3  . polyethylene glycol (MIRALAX / GLYCOLAX) packet Take 17 g by  mouth daily as needed for mild constipation.    Marland Kitchen PROAIR HFA 108 (90 Base) MCG/ACT inhaler INHALE TWO PUFFS INTO LUNGS EVERY 6 HOURS AS NEEDED FOR WHEEZING AND FOR SHORTNESS OF BREATH 9 each 4  . rosuvastatin (CRESTOR) 20 MG tablet Take 1 tablet (20 mg total) by mouth daily. 90 tablet 3  . SODIUM BICARBONATE PO Take 1 tablet by mouth 2 (two) times daily. Only taking once a day    . TRADJENTA 5 MG TABS tablet TAKE 1 TABLET BY MOUTH ONCE DAILY 90 tablet 2  . TRESIBA FLEXTOUCH 100 UNIT/ML SOPN FlexTouch Pen INJECT 150 UNITS SUBCUTANEOUSLY ONCE DAILY (Patient taking differently: Inject 140 Units into the skin daily. ) 45 mL 2  . VITAMIN A PO Take 2,400 Units by mouth daily.     No current facility-administered  medications for this visit.     REVIEW OF SYSTEMS:   _0  denotes positive finding, _1  denotes negative finding Cardiac  Comments:  Chest pain or chest pressure:    Shortness of breath upon exertion:    Short of breath when lying flat:    Irregular heart rhythm:        Vascular    Pain in calf, thigh, or hip brought on by ambulation:    Pain in feet at night that wakes you up from your sleep:     Blood clot in your veins:    Leg swelling:         Pulmonary    Oxygen at home:    Productive cough:     Wheezing:         Neurologic    Sudden weakness in arms or legs:     Sudden numbness in arms or legs:     Sudden onset of difficulty speaking or slurred speech:    Temporary loss of vision in one eye:     Problems with dizziness:         Gastrointestinal    Blood in stool:     Vomited blood:         Genitourinary    Burning when urinating:     Blood in urine:        Psychiatric    Major depression:         Hematologic    Bleeding problems:    Problems with blood clotting too easily:        Skin    Rashes or ulcers:        Constitutional    Fever or chills:      PHYSICAL EXAM:   Vitals:   04/20/18 1052 04/20/18 1057  BP: (!) 142/79 131/75  Pulse: 74 74  Resp: 16   Temp: (!) 97.4 F (36.3 C)   TempSrc: Oral   SpO2: 99%   Weight: 252 lb (114.3 kg)   Height: _2  (1.702 m)     GENERAL: The patient is a well-nourished female, in no acute distress. The vital signs are documented above. CARDIAC: There is a regular rate and rhythm.  VASCULAR: Palpable thrill within fistula in the left PULMONARY: Non-labored respirations MUSCULOSKELETAL: There are no major deformities or cyanosis. NEUROLOGIC: No focal weakness or paresthesias are detected. SKIN: There are no ulcers or rashes noted. PSYCHIATRIC: The patient has a normal affect.  STUDIES:   I have reviewed her vascular lab studies with the following  findings:  +------------+----------+-------------+----------+--------+ OUTFLOW VEINPSV (cm/s)Diameter (mm)Depth (mm)Describe +------------+----------+-------------+----------+--------+ Prox UA     137  0.7     0.4       +------------+----------+-------------+----------+--------+ Mid UA     134     0.7     0.4       +------------+----------+-------------+----------+--------+ Dist UA     236     0.7     0.7       +------------+----------+-------------+----------+--------+ AC Fossa    525     0.4     0.7       +------------+----------+-------------+----------+--------+  MEDICAL ISSUES:   The patient will be scheduled for a second stage left basilic vein fistula.  I discussed the details of the operation with her.  She is been scheduled for Thursday, October 24    Annamarie Major, MD [facility] [phone] Pager [phone]

## 2018-04-20 NOTE — Progress Notes (Signed)
 [facility] [place]  Patient name: [name] MRN: [id number] DOB: 02/25/1951 Sex: female   REASON FOR VISIT:    Follow up  HISOTRY OF PRESENT ILLNESS:    [name] is a 67 y.o. female who returns for follow-up.  She is status post left first stage basilic vein fistula creation on 08/08/2017.  She has missed her postoperative appointment.  She is not yet on dialysis.  She denies any pain in her left arm or hand   PAST MEDICAL HISTORY:   Past Medical History:  Diagnosis Date  . Anemia   . Arthritis   . Asthma   . CKD stage 3 due to type 2 diabetes mellitus (HCC)   . Depression   . Diabetic retinopathy of both eyes (HCC)   . GERD (gastroesophageal reflux disease)   . HLD (hyperlipidemia)   . Hypertension   . Microalbuminuria due to type 2 diabetes mellitus (HCC)   . Neuromuscular disorder (HCC)    diabetic neuropathy  . Pneumonia    hx of   . Renal cell cancer (HCC)   . 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 Heart disease Mother   . Diabetes Sister     SOCIAL HISTORY:   Social History   Tobacco Use  . Smoking status: Never Smoker  . Smokeless tobacco: Never Used  Substance Use Topics  . Alcohol use: Yes    Alcohol/week: 0.0 standard drinks    Comment: occ      ALLERGIES:   Allergies  Allergen Reactions  . Penicillins Other (See Comments)    From childhood Has patient had a PCN reaction causing immediate rash, facial/tongue/throat swelling, SOB or lightheadedness with hypotension: Unknown Has patient had a PCN reaction causing severe rash involving mucus membranes or skin necrosis: Unknown Has patient had a PCN reaction that required hospitalization: Unknown Has patient had a PCN reaction occurring within the last 10 years: Unknown If all of the above answers are "NO", then may proceed with  Cephalosporin use.   . Adhesive [Tape] Rash     CURRENT MEDICATIONS:   Current Outpatient Medications  Medication Sig Dispense Refill  . amLODipine (NORVASC) 10 MG tablet TAKE ONE TABLET BY MOUTH ONCE DAILY (Patient taking differently: TAKE ONE TABLET BY MOUTH ONCE IN THE EVENING) 90 tablet 3  . Blood Glucose Monitoring Suppl (ONE TOUCH ULTRA 2) w/Device KIT Use to check BS BID-QID Dx:E11.9 1 each 1  . BREO ELLIPTA 100-25 MCG/INH AEPB INHALE ONE PUFF BY MOUTH ONCE DAILY 60 each 11  . calcitRIOL (ROCALTROL) 0.25 MCG capsule Take 0.25 mcg by mouth daily.    . carvedilol (COREG) 3.125 MG tablet Take 1 tablet (3.125 mg total) by mouth 2 (two) times daily. 180 tablet 3  . CINNAMON PO Take by mouth daily.    . Dulaglutide (TRULICITY) 0.75 MG/0.5ML SOPN Inject 0.75 mg into the skin once a week. 5 pen 5  . furosemide (LASIX) 20 MG tablet TAKE 1 TABLET BY MOUTH ONCE DAILY 90 tablet 1  . gabapentin (NEURONTIN) 300 MG capsule TAKE 1 CAPSULE BY MOUTH THREE TIMES DAILY 90 capsule 2  . glucose blood (ONE TOUCH ULTRA TEST) test strip Use as instructed 300 each 3  . Lancets (ONETOUCH ULTRASOFT) lancets Use as instructed 300 each 3  . polyethylene glycol (MIRALAX / GLYCOLAX) packet Take 17 g by   mouth daily as needed for mild constipation.    . PROAIR HFA 108 (90 Base) MCG/ACT inhaler INHALE TWO PUFFS INTO LUNGS EVERY 6 HOURS AS NEEDED FOR WHEEZING AND FOR SHORTNESS OF BREATH 9 each 4  . rosuvastatin (CRESTOR) 20 MG tablet Take 1 tablet (20 mg total) by mouth daily. 90 tablet 3  . SODIUM BICARBONATE PO Take 1 tablet by mouth 2 (two) times daily. Only taking once a day    . TRADJENTA 5 MG TABS tablet TAKE 1 TABLET BY MOUTH ONCE DAILY 90 tablet 2  . TRESIBA FLEXTOUCH 100 UNIT/ML SOPN FlexTouch Pen INJECT 150 UNITS SUBCUTANEOUSLY ONCE DAILY (Patient taking differently: Inject 140 Units into the skin daily. ) 45 mL 2  . VITAMIN A PO Take 2,400 Units by mouth daily.     No current facility-administered  medications for this visit.     REVIEW OF SYSTEMS:   [X] denotes positive finding, [ ] denotes negative finding Cardiac  Comments:  Chest pain or chest pressure:    Shortness of breath upon exertion:    Short of breath when lying flat:    Irregular heart rhythm:        Vascular    Pain in calf, thigh, or hip brought on by ambulation:    Pain in feet at night that wakes you up from your sleep:     Blood clot in your veins:    Leg swelling:         Pulmonary    Oxygen at home:    Productive cough:     Wheezing:         Neurologic    Sudden weakness in arms or legs:     Sudden numbness in arms or legs:     Sudden onset of difficulty speaking or slurred speech:    Temporary loss of vision in one eye:     Problems with dizziness:         Gastrointestinal    Blood in stool:     Vomited blood:         Genitourinary    Burning when urinating:     Blood in urine:        Psychiatric    Major depression:         Hematologic    Bleeding problems:    Problems with blood clotting too easily:        Skin    Rashes or ulcers:        Constitutional    Fever or chills:      PHYSICAL EXAM:   Vitals:   04/20/18 1052 04/20/18 1057  BP: (!) 142/79 131/75  Pulse: 74 74  Resp: 16   Temp: (!) 97.4 F (36.3 C)   TempSrc: Oral   SpO2: 99%   Weight: 252 lb (114.3 kg)   Height: 5' 7" (1.702 m)     GENERAL: The patient is a well-nourished female, in no acute distress. The vital signs are documented above. CARDIAC: There is a regular rate and rhythm.  VASCULAR: Palpable thrill within fistula in the left PULMONARY: Non-labored respirations MUSCULOSKELETAL: There are no major deformities or cyanosis. NEUROLOGIC: No focal weakness or paresthesias are detected. SKIN: There are no ulcers or rashes noted. PSYCHIATRIC: The patient has a normal affect.  STUDIES:   I have reviewed her vascular lab studies with the following  findings:  +------------+----------+-------------+----------+--------+ OUTFLOW VEINPSV (cm/s)Diameter (mm)Depth (mm)Describe +------------+----------+-------------+----------+--------+ Prox UA     137       0.7     0.4       +------------+----------+-------------+----------+--------+ Mid UA     134     0.7     0.4       +------------+----------+-------------+----------+--------+ Dist UA     236     0.7     0.7       +------------+----------+-------------+----------+--------+ AC Fossa    525     0.4     0.7       +------------+----------+-------------+----------+--------+  MEDICAL ISSUES:   The patient will be scheduled for a second stage left basilic vein fistula.  I discussed the details of the operation with her.  She is been scheduled for Thursday, October 24    Wells Brabham, MD [facility] [place] Tel [phone] Pager [phone] 

## 2018-04-20 NOTE — Progress Notes (Signed)
Vitals:   04/20/18 1052  BP: (!) 142/79  Pulse: 74  Resp: 16  Temp: (!) 97.4 F (36.3 C)  TempSrc: Oral  SpO2: 99%  Weight: 252 lb (114.3 kg)  Height: 5\' 7"  (1.702 m)

## 2018-04-23 DIAGNOSIS — H2513 Age-related nuclear cataract, bilateral: Secondary | ICD-10-CM | POA: Diagnosis not present

## 2018-04-23 DIAGNOSIS — H35033 Hypertensive retinopathy, bilateral: Secondary | ICD-10-CM | POA: Diagnosis not present

## 2018-04-23 DIAGNOSIS — E113391 Type 2 diabetes mellitus with moderate nonproliferative diabetic retinopathy without macular edema, right eye: Secondary | ICD-10-CM | POA: Diagnosis not present

## 2018-04-23 DIAGNOSIS — H34832 Tributary (branch) retinal vein occlusion, left eye, with macular edema: Secondary | ICD-10-CM | POA: Diagnosis not present

## 2018-04-23 LAB — HM DIABETES EYE EXAM

## 2018-04-26 ENCOUNTER — Other Ambulatory Visit: Payer: Self-pay | Admitting: Family Medicine

## 2018-04-26 DIAGNOSIS — J4540 Moderate persistent asthma, uncomplicated: Secondary | ICD-10-CM

## 2018-04-27 ENCOUNTER — Encounter: Payer: Self-pay | Admitting: *Deleted

## 2018-04-27 ENCOUNTER — Encounter: Payer: Self-pay | Admitting: Family Medicine

## 2018-04-27 DIAGNOSIS — E133299 Other specified diabetes mellitus with mild nonproliferative diabetic retinopathy without macular edema, unspecified eye: Secondary | ICD-10-CM | POA: Insufficient documentation

## 2018-04-28 ENCOUNTER — Telehealth: Payer: Self-pay | Admitting: *Deleted

## 2018-04-28 NOTE — Telephone Encounter (Signed)
Call to patient with time change for 05/14/18 surgery. To arrive at [facility] admitting at 7:30 am.

## 2018-05-04 ENCOUNTER — Encounter: Payer: Self-pay | Admitting: *Deleted

## 2018-05-06 DIAGNOSIS — D49511 Neoplasm of unspecified behavior of right kidney: Secondary | ICD-10-CM | POA: Diagnosis not present

## 2018-05-06 DIAGNOSIS — C641 Malignant neoplasm of right kidney, except renal pelvis: Secondary | ICD-10-CM | POA: Diagnosis not present

## 2018-05-06 DIAGNOSIS — C7972 Secondary malignant neoplasm of left adrenal gland: Secondary | ICD-10-CM | POA: Diagnosis not present

## 2018-05-06 DIAGNOSIS — I700 Atherosclerosis of aorta: Secondary | ICD-10-CM | POA: Diagnosis not present

## 2018-05-07 ENCOUNTER — Ambulatory Visit (INDEPENDENT_AMBULATORY_CARE_PROVIDER_SITE_OTHER): Payer: Medicare Other | Admitting: Pharmacist Clinician (PhC)/ Clinical Pharmacy Specialist

## 2018-05-07 ENCOUNTER — Encounter: Payer: Self-pay | Admitting: Pharmacist Clinician (PhC)/ Clinical Pharmacy Specialist

## 2018-05-07 DIAGNOSIS — I519 Heart disease, unspecified: Secondary | ICD-10-CM | POA: Diagnosis not present

## 2018-05-07 MED ORDER — CARVEDILOL 6.25 MG PO TABS: 6.2500 mg | ORAL_TABLET | Freq: Two times a day (BID) | ORAL | 1 refills | Status: DC

## 2018-05-07 NOTE — Progress Notes (Signed)
05/08/2018 [name] 12-Apr-1951 [id number]   HPI:  [name] is a 67 y.o. female patient of Dr Gwenlyn Found, with a PMH below who presents today for heart failure medication titration.  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  She saw Dr. Gwenlyn Found last month and was started on carvedilol 3.125 mg bid.  Since then she has noticed some LEE in past week or two.  Of note she is also on amlodipine 10 mg, but has been on this dose for several years.  She notes that her life has been somewhat stressful in the past week as she had a CT scan yesterday and she is waiting for results.  And next week she is having second stage basilic vein transposition for possible upcoming dialysis.    Blood Pressure Goal:  130/80  Current Medications:  Carvedilol 3.125 mg bid  Amlodipine 10 mg qd  Family Hx:  Father had CAD, heart disease    Brother died from aneurysm, stroke, had MI, stents, DM  Mother - DM  2 sons, oldest has already had stents around age 75  Social Hx:  To tobacco, rare alcohol, drinks hot tea and only occasional coffee  Diet:  Eats out regularly; mix of fast food, sit down, admits to fast food at least once daily.  Tried to work out with sister to buy groceries and cook at home, but they did not do well with this plan  Exercise:  Too many leg issues, loses balance easily  Home BP readings:  Wrist cuff < 6 months old; did not bring it with her today.   This am 151/76, has been 120-130's but now stressed about surgery and yesterday scan.  Was as high as 170  Intolerances:   No cardiac medication intolerances  Labs:  04/02/18:  Na 143, K 4.7, Glu 113, BUN 57, SCr 3.75 (CrCl 26.3) Wt Readings from Last 3 Encounters:  04/20/18 252 lb (114.3 kg)  04/10/18 253 lb (114.8 kg)  04/07/18 252 lb (114.3 kg)   BP Readings from Last 3  Encounters:  05/07/18 (!) 150/72  04/20/18 131/75  04/10/18 122/60   Pulse Readings from Last 3 Encounters:  05/07/18 76  04/20/18 74  04/10/18 72    Current Outpatient Medications  Medication Sig Dispense Refill  . amLODipine (NORVASC) 10 MG tablet TAKE ONE TABLET BY MOUTH ONCE DAILY (Patient taking differently: TAKE ONE TABLET BY MOUTH ONCE IN THE EVENING) 90 tablet 3  . Blood Glucose Monitoring Suppl (ONE TOUCH ULTRA 2) w/Device KIT Use to check BS BID-QID Dx:E11.9 1 each 1  . BREO ELLIPTA 100-25 MCG/INH AEPB INHALE 1 PUFF BY MOUTH ONCE DAILY 60 each 11  . calcitRIOL (ROCALTROL) 0.25 MCG capsule Take 0.25 mcg by mouth daily.    . carvedilol (COREG) 6.25 MG tablet Take 1 tablet (6.25 mg total) by mouth 2 (two) times daily. 60 tablet 1  . CINNAMON PO Take by mouth daily.    . Dulaglutide (TRULICITY) 4.12 IN/8.6VE SOPN Inject 0.75 mg into the skin once a week. 5 pen 5  . furosemide (LASIX) 20 MG tablet TAKE 1 TABLET BY MOUTH ONCE DAILY 90 tablet 1  . gabapentin (NEURONTIN) 300 MG capsule TAKE 1 CAPSULE BY MOUTH THREE TIMES DAILY 90 capsule 2  . glucose blood (ONE TOUCH ULTRA TEST) test strip Use as instructed  300 each 3  . Lancets (ONETOUCH ULTRASOFT) lancets Use as instructed 300 each 3  . polyethylene glycol (MIRALAX / GLYCOLAX) packet Take 17 g by mouth daily as needed for mild constipation.    Marland Kitchen PROAIR HFA 108 (90 Base) MCG/ACT inhaler INHALE TWO PUFFS INTO LUNGS EVERY 6 HOURS AS NEEDED FOR WHEEZING AND FOR SHORTNESS OF BREATH 9 each 4  . rosuvastatin (CRESTOR) 20 MG tablet Take 1 tablet (20 mg total) by mouth daily. 90 tablet 3  . SODIUM BICARBONATE PO Take 1 tablet by mouth 2 (two) times daily. Only taking once a day    . TRADJENTA 5 MG TABS tablet TAKE 1 TABLET BY MOUTH ONCE DAILY 90 tablet 2  . TRESIBA FLEXTOUCH 100 UNIT/ML SOPN FlexTouch Pen INJECT 150 UNITS SUBCUTANEOUSLY ONCE DAILY (Patient taking differently: Inject 140 Units into the skin daily. ) 45 mL 2  . VITAMIN A PO  Take 2,400 Units by mouth daily.     No current facility-administered medications for this visit.     Allergies  Allergen Reactions  . Penicillins Other (See Comments)    From childhood Has patient had a PCN reaction causing immediate rash, facial/tongue/throat swelling, SOB or lightheadedness with hypotension: Unknown Has patient had a PCN reaction causing severe rash involving mucus membranes or skin necrosis: Unknown Has patient had a PCN reaction that required hospitalization: Unknown Has patient had a PCN reaction occurring within the last 10 years: Unknown If all of the above answers are "NO", then may proceed with Cephalosporin use.   . Adhesive [Tape] Rash    Past Medical History:  Diagnosis Date  . Anemia   . Arthritis   . Asthma   . CKD stage 3 due to type 2 diabetes mellitus ([place])   . Depression   . Diabetic retinopathy of both eyes ([place])   . GERD (gastroesophageal reflux disease)   . HLD (hyperlipidemia)   . Hypertension   . Microalbuminuria due to type 2 diabetes mellitus ([place])   . Neuromuscular disorder ([place])    diabetic neuropathy  . 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 Pneumonia    hx of   . Renal cell cancer ([place])   . 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  Left ventricular dysfunction Patient with LV dysfunction, EF at 40% and hypertension.  Blood pressure elevated today at 150/72.  Because she is having some increased edema in her lower extremities, I will have her decrease the amlodipine to 5 mg daily and increase the carvedilol to 6.25 mg twice daily.  The edema could also be related to her diet, which has to be high in sodium, since she is eating fast food at least once daily.  We will work on some suggestions for improved diet at her next visit.  She would do well to be on a low dose ACEI/ARB, although I am not sure how her nephrologist would feel about that.   Will keep it in consideration, and although I don't think we could get her to a high enough dose to be effective in treating her hypertension, it would give her some renal protection.  We will see her back in a month for follow up.  She was encouraged to bring her home BP cuff and log of readings.   Tommy Medal PharmD CPP [facility] [address] [place], [place] [address] [phone]

## 2018-05-07 NOTE — Patient Instructions (Addendum)
Return for a a follow up appointment in one month  Your blood pressure today is 150/72  Check your blood pressure at home daily and keep record of the readings.  Take your BP meds as follows:  Cut amlodipine to 5 mg once daly (cut 10 mg tablets in half)  Increase carvedilol to 6.25 mg twice daily (take 2 of the 3.125 mg tablets until they are gone)  Bring all of your meds, your BP cuff and your record of home blood pressures to your next appointment.  Exercise as you're able, try to walk approximately 30 minutes per day.  Keep salt intake to a minimum, especially watch canned and prepared boxed foods.  Eat more fresh fruits and vegetables and fewer canned items.  Avoid eating in fast food restaurants.    HOW TO TAKE YOUR BLOOD PRESSURE: . Rest 5 minutes before taking your blood pressure. .  Don't smoke or drink caffeinated beverages for at least 30 minutes before. . Take your blood pressure before (not after) you eat. . Sit comfortably with your back supported and both feet on the floor (don't cross your legs). . Elevate your arm to heart level on a table or a desk. . Use the proper sized cuff. It should fit smoothly and snugly around your bare upper arm. There should be enough room to slip a fingertip under the cuff. The bottom edge of the cuff should be 1 inch above the crease of the elbow. . Ideally, take 3 measurements at one sitting and record the average.

## 2018-05-08 NOTE — Assessment & Plan Note (Signed)
Patient with LV dysfunction, EF at 40% and hypertension.  Blood pressure elevated today at 150/72.  Because she is having some increased edema in her lower extremities, I will have her decrease the amlodipine to 5 mg daily and increase the carvedilol to 6.25 mg twice daily.  The edema could also be related to her diet, which has to be high in sodium, since she is eating fast food at least once daily.  We will work on some suggestions for improved diet at her next visit.  She would do well to be on a low dose ACEI/ARB, although I am not sure how her nephrologist would feel about that.  Will keep it in consideration, and although I don't think we could get her to a high enough dose to be effective in treating her hypertension, it would give her some renal protection.  We will see her back in a month for follow up.  She was encouraged to bring her home BP cuff and log of readings.

## 2018-05-11 DIAGNOSIS — Z85528 Personal history of other malignant neoplasm of kidney: Secondary | ICD-10-CM | POA: Diagnosis not present

## 2018-05-11 NOTE — Progress Notes (Signed)
[facility] Note  05/12/2018     CHIEF COMPLAINT Patient presents for Retina Evaluation and Diabetic Eye Exam   HISTORY OF PRESENT ILLNESS: [name] is a 67 y.o. female who presents to the clinic today for:   HPI    Retina Evaluation    In both eyes.  This started 1 year ago.  Associated Symptoms Negative for Flashes, Blind Spot, Photophobia, Scalp Tenderness, Fever, Floaters, Pain, Glare, Jaw Claudication, Weight Loss, Distortion, Redness, Trauma, Shoulder/Hip pain and Fatigue.  Context:  distance vision, mid-range vision and near vision.  Treatments tried include artificial tears.  Response to treatment was no improvement.  I, the attending physician,  performed the HPI with the patient and updated documentation appropriately.          Diabetic Eye Exam    Vision is stable.  Associated Symptoms Negative for Floaters, Pain, Glare, Jaw Claudication, Weight Loss, Fever, Scalp Tenderness, Photophobia, Blind Spot, Flashes, Distortion, Redness, Trauma, Shoulder/Hip pain and Fatigue.  Diabetes characteristics include Type 2.  This started 15.  Blood sugar level is controlled.  Last Blood Glucose 61.  Last A1C 7.3.  Associated Diagnosis Kidney Disease and Neuropathy.  I, the attending physician,  performed the HPI with the patient and updated documentation appropriately.          Comments    Referral of Dr. Kathlen [name] for retina eval. [name]. Patient states for appx a yr., she has had watery eyes and dry eyes she also feels there is "something blocking" her vision in the inner lower corner of her left eye. Pt is using Dry eye Gtt's PRN. Pt is DM2 x 15 yrs BS 61(05/11/18) in the am, A1C 7.3(03/2018), BS are usually WINL per patient. Pt is on [place] ,trulicity and [place].Pt reports she has kidney DZ, she is scheduled to have fistula relocated in left arm Thursday   (05/14/18) .       Last edited by [name] Caffey, MD on 05/12/2018  1:02 PM. (History)    pt  states she started noticing a problem in her left eye a couple months ago, she states it seemed to happen quickly, she states her health problems started with diabetes, then went to her kidneys and she found a mass on her ovaries, but was told yesterday that is going down by itself, she states she also has a weak heart muscle, pt states this is the second time she has seen Dr. Kathlen [name], she states her first visit was for a diabetic check, she states she sees a spot down low that seems like something is in her eye, pts last A1C was 7.3 in September, pt states blood pressure has been under control recently, she states she has a meter at home so she checks it regularly, she states it fluctuates depending on what kind of stress she's under  Referring physician: [name], MD [place], [place] [phone]  HISTORICAL INFORMATION:   Selected notes from the [facility] Referred by Dr. Quentin [name] for concern of BRVO OS LEE: 10.03.19 ([name]) [BCVA: OD: 20/30 OS: 20/50] Ocular Hx-cataracts OU, HTR OU, NPDR OU, DES PMH-DM (W4O: 7.3, takes Trulicity and [place]), HTN, HLD     CURRENT MEDICATIONS: No current outpatient medications on file. (Ophthalmic Drugs)   No current facility-administered medications for this visit.  (Ophthalmic Drugs)   Current Outpatient Medications (Other)  Medication Sig  . amLODipine (NORVASC) 10 MG tablet TAKE ONE  TABLET BY MOUTH ONCE DAILY (Patient taking differently: Take 5 mg by mouth daily. )  . Blood Glucose Monitoring Suppl (ONE TOUCH ULTRA 2) w/Device KIT Use to check BS BID-QID Dx:E11.9  . BREO ELLIPTA 100-25 MCG/INH AEPB INHALE 1 PUFF BY MOUTH ONCE DAILY (Patient taking differently: Inhale 1 puff into the lungs daily. )  . calcitRIOL (ROCALTROL) 0.25 MCG capsule Take 0.25 mcg by mouth daily.  . carvedilol (COREG) 3.125 MG tablet Take 6.25 mg by mouth 2 (two) times daily.  . carvedilol (COREG) 6.25 MG tablet Take 1 tablet (6.25  mg total) by mouth 2 (two) times daily.  Marland Kitchen CINNAMON PO Take 350 mg by mouth daily.   . Dulaglutide (TRULICITY) 4.40 HK/7.4QV SOPN Inject 0.75 mg into the skin once a week. (Patient taking differently: Inject 0.75 mg into the skin every Wednesday. )  . furosemide (LASIX) 20 MG tablet TAKE 1 TABLET BY MOUTH ONCE DAILY  . gabapentin (NEURONTIN) 300 MG capsule TAKE 1 CAPSULE BY MOUTH THREE TIMES DAILY (Patient taking differently: Take 300 mg by mouth 3 (three) times daily as needed (for pain/neuropathy in feet/hands.). )  . glucose blood (ONE TOUCH ULTRA TEST) test strip Use as instructed  . Lancets (ONETOUCH ULTRASOFT) lancets Use as instructed  . polyethylene glycol (MIRALAX / GLYCOLAX) packet Take 17 g by mouth daily as needed for mild constipation.  Marland Kitchen PROAIR HFA 108 (90 Base) MCG/ACT inhaler INHALE TWO PUFFS INTO LUNGS EVERY 6 HOURS AS NEEDED FOR WHEEZING AND FOR SHORTNESS OF BREATH (Patient taking differently: Inhale 2 puffs into the lungs every 6 (six) hours as needed for wheezing or shortness of breath. )  . rosuvastatin (CRESTOR) 20 MG tablet Take 1 tablet (20 mg total) by mouth daily. (Patient taking differently: Take 20 mg by mouth every evening. )  . sodium bicarbonate 650 MG tablet Take 650 mg by mouth 2 (two) times daily.  . TRADJENTA 5 MG TABS tablet TAKE 1 TABLET BY MOUTH ONCE DAILY (Patient taking differently: Take 5 mg by mouth daily. )  . TRESIBA FLEXTOUCH 100 UNIT/ML SOPN FlexTouch Pen INJECT 150 UNITS SUBCUTANEOUSLY ONCE DAILY (Patient taking differently: Inject 140 Units into the skin daily. )  . VITAMIN A PO Take 2,400 Units by mouth daily.   No current facility-administered medications for this visit.  (Other)      REVIEW OF SYSTEMS: ROS    Positive for: Endocrine, Eyes   Negative for: Constitutional, Gastrointestinal, Neurological, Skin, Genitourinary, Musculoskeletal, HENT, Cardiovascular, Respiratory, Psychiatric, Allergic/Imm, Heme/Lymph   Last edited by [name] Jordan, LPN on 95/63/8756  4:33 AM. (History)       ALLERGIES Allergies  Allergen Reactions  . Penicillins Other (See Comments)    From childhood Has patient had a PCN reaction causing immediate rash, facial/tongue/throat swelling, SOB or lightheadedness with hypotension: Unknown Has patient had a PCN reaction causing severe rash involving mucus membranes or skin necrosis: Unknown Has patient had a PCN reaction that required hospitalization: Unknown Has patient had a PCN reaction occurring within the last 10 years: Unknown If all of the above answers are "NO", then may proceed with Cephalosporin use.   . Adhesive [Tape] Rash    PAST MEDICAL HISTORY Past Medical History:  Diagnosis Date  . Anemia   . Arthritis   . Asthma   . CKD stage 3 due to type 2 diabetes mellitus ([place])   . Depression   . Diabetic retinopathy of both eyes ([place])   . GERD (gastroesophageal reflux disease)   .  HLD (hyperlipidemia)   . Hypertension   . Microalbuminuria due to type 2 diabetes mellitus ([place])   . Neuromuscular disorder ([place])    diabetic neuropathy  . Nonproliferative retinopathy due to secondary diabetes ([place])   . Pneumonia    hx of   . Renal cell cancer ([place])   . 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 BASCILIC VEIN TRANSPOSITION Left 08/08/2017   Procedure: BASILIC VEIN TRANSPOSITION FIRST STAGE LEFT ARM;  Surgeon: Serafina Mitchell, MD;  Location: [facility];  Service: Vascular;  Laterality: Left;  . ROBOTIC ADRENALECTOMY Left 03/10/2017   Procedure: XI ROBOTIC ADRENALECTOMY;  Surgeon: Nickie Retort, MD;  Location: [facility];  Service: Urology;  Laterality: Left;  . ROBOTIC ASSITED PARTIAL NEPHRECTOMY Right 03/10/2017   Procedure: XI ROBOTIC ASSITED RADICAL NEPHRECTOMY;  Surgeon: Nickie Retort, MD;  Location: [facility];  Service: Urology;  Laterality: Right;  . TOE AMPUTATION Bilateral    both great toe ,, left  foot 2nd toe 1/2    FAMILY HISTORY Family History  Problem Relation Age of Onset  . Heart disease Mother   . Diabetes Sister     SOCIAL HISTORY Social History   Tobacco Use  . Smoking status: Never Smoker  . Smokeless tobacco: Never Used  Substance Use Topics  . Alcohol use: Yes    Alcohol/week: 0.0 standard drinks    Comment: occ   . Drug use: No         OPHTHALMIC EXAM:  Base Eye Exam    Visual Acuity (Snellen - Linear)      Right Left   Dist [place] 20/30 -1 20/150 -1   Dist ph  [place] 20/25 -1 20/50 -1       Tonometry (Tonopen, 9:07 AM)      Right Left   Pressure 15 15       Pupils      Dark Light Shape React APD   Right 3 2 Round Brisk None   Left 3 2 Round Brisk None       Visual Fields (Counting fingers)      Left Right    Full Full       Extraocular Movement      Right Left    Full, Ortho Full, Ortho       Neuro/Psych    Oriented x3:  Yes       Dilation    Both eyes:  1.0% Mydriacyl, 2.5% Phenylephrine @ 9:07 AM        Slit Lamp and Fundus Exam    Slit Lamp Exam      Right Left   Lids/Lashes Dermatochalasis - upper lid, mild MGD Dermatochalasis - upper lid, Telangiectasia, mild MGD   Conjunctiva/Sclera White and quiet White and quiet   Cornea 1+ Punctate epithelial erosions, mild Arcus 1+ Punctate epithelial erosions   Anterior Chamber Deep and quiet Deep and quiet   Iris Round and dilated, No NVI Round and well dilated   Lens 2+ Nuclear sclerosis, 2-3+ Cortical cataract 2-3+ Nuclear sclerosis, early brunescense, 2-3+ Cortical cataract, 1-2+ Posterior subcapsular cataract   Vitreous Vitreous syneresis Vitreous syneresis       Fundus Exam      Right Left   Disc Pink and Sharp, punctate disc heme at 1100 Pink and Sharp   C/D Ratio 0.3 0.25   Macula Blunted foveal reflex, scattered MA and IRH Blunted foveal reflex, scattered  DBH, small cluster of DBH and CWS IN macula   Vessels Vascular attenuation, Tortuous, AV crossing changes Tortuous,  Vascular attenuation, AV crossing changes   Periphery Attached, scattered DBH posteriorly, small CWS nasal to disc Attached, scattered MA/DBH        Refraction    Manifest Refraction (Retinoscopy)      Sphere Cylinder Axis Dist VA   Right -0.75 +1.75 175 20/30-2   Left -5.75 +1.25 180 20/70-2          IMAGING AND PROCEDURES  Imaging and Procedures for '@TODAY'$ @  OCT, Retina - OU - Both Eyes       Right Eye Quality was good. Central Foveal Thickness: 258. Progression has no prior data. Findings include normal foveal contour, intraretinal fluid, no SRF (Scattered cystic changes).   Left Eye Quality was good. Central Foveal Thickness: 267. Progression has no prior data. Findings include normal foveal contour, intraretinal fluid, no SRF (+IRF IN macula).   Notes *Images captured and stored on drive  Diagnosis / Impression:  NFP; +IRF; no SRF OU noncentral DME OU (OS > OD) OD - scattered cystic changes OS - focal DME inf nasal macula  Clinical management:  See below  Abbreviations: NFP - Normal foveal profile. CME - cystoid macular edema. PED - pigment epithelial detachment. IRF - intraretinal fluid. SRF - subretinal fluid. EZ - ellipsoid zone. ERM - epiretinal membrane. ORA - outer retinal atrophy. ORT - outer retinal tubulation. SRHM - subretinal hyper-reflective material        Fluorescein Angiography [place] (Transit OS)       Right Eye   Progression has no prior data. Early phase findings include microaneurysm, blockage. Mid/Late phase findings include blockage, leakage, microaneurysm.   Left Eye   Progression has no prior data. Early phase findings include microaneurysm, blockage. Mid/Late phase findings include leakage, blockage, microaneurysm.   Notes Images stored on drive;   Impression: NPDR OU No NV OU         Intravitreal Injection, Pharmacologic Agent - OS - Left Eye       Time Out 05/12/2018. 10:54 AM. Confirmed correct patient,  procedure, site, and patient consented.   Anesthesia Topical anesthesia was used. Anesthetic medications included Tetracaine 0.5%, Lidocaine 2%.   Procedure Preparation included 5% betadine to ocular surface, eyelid speculum. A 30 gauge needle was used.   Injection:  1.25 mg Bevacizumab 1.'25mg'$ /0.59m   NDC: 513244-010-27 Lot: [id number]'@32'$ , Expiration date: 07/02/2018   Route: Intravitreal, Site: Left Eye, Waste: 0 mg  Post-op Post injection exam found visual acuity of at least counting fingers. The patient tolerated the procedure well. There were no complications. The patient received written and verbal post procedure care education.                 ASSESSMENT/PLAN:    ICD-10-CM   1. Moderate nonproliferative diabetic retinopathy of both eyes with macular edema associated with type 2 diabetes mellitus ([place] [address]  2. Retinal edema H35.81 OCT, Retina - OU - Both Eyes    Intravitreal Injection, Pharmacologic Agent - OS - Left Eye  3. Essential hypertension I10   4. Hypertensive retinopathy of both eyes H35.033 Fluorescein Angiography [place] (Transit OS)  5. Combined forms of age-related cataract of both eyes H25.813     1,2 Moderate non-proliferative diabetic retinopathy w/ DME OU (OS > OD) - The incidence, risk factors for progression, natural history and treatment options for diabetic retinopathy  were discussed with patient.   - The need  for close monitoring of blood glucose, blood pressure, and serum lipids, avoiding cigarette or any type of tobacco, and the need for long term follow up was also discussed with patient. - exam shows scattered MA, BCVA 20/50 OS - FA shows leaking MA, no NV OU - OCT shows diabetic macular edema OU (OS > OD)  The natural history, pathology, and characteristics of diabetic macular edema discussed with patient.  A generalized discussion of the major clinical trials concerning treatment of diabetic macular edema (ETDRS, DCT, SCORE, RISE /  RIDE, and ongoing DRCR net studies) was completed.  This discussion included mention of the various approaches to treating diabetic macular edema (observation, laser photocoagulation, anti-VEGF injections with lucentis / Avastin / Eylea, steroid injections with Kenalog / Ozurdex, and intraocular surgery with vitrectomy).  The goal hemoglobin A1C of 6-7 was discussed, as well as importance of smoking cessation and hypertension control.  Need for ongoing treatment and monitoring were specifically discussed with reference to chronic nature of diabetic macular edema. - recommend IVA #1 OS today, 10.22.19, but discussed visual improvement may be limited by visually significant cataract OS - pt wishes to proceed - RBA of procedure discussed, questions answered - informed consent obtained and signed - see procedure note - f/u in 4 wks  3,4. Hypertensive retinopathy OU - discussed importance of tight BP control - monitor  5. Combined Form Cataracts OU (OS > OD) - The symptoms of cataract, surgical options, and treatments and risks were discussed with patient. - discussed diagnosis and progression - cataract OS likely visually significant, OD borderline - under the expert management of Dr. Kathlen [name]   Ophthalmic Meds Ordered this visit:  No orders of the defined types were placed in this encounter.      Return in about 4 weeks (around 06/09/2018) for F/U, NPDR OU, OCT, DFE, injxn.  There are no Patient Instructions on file for this visit.   Explained the diagnoses, plan, and follow up with the patient and they expressed understanding.  Patient expressed understanding of the importance of proper follow up care.   This document serves as a record of services personally performed by Gardiner Sleeper, MD, PhD. It was created on their behalf by Ernest Mallick, OA, an ophthalmic assistant. The creation of this record is the provider's dictation and/or activities during the visit.    Electronically  signed by: Ernest Mallick, OA  10.21.19 1:15 PM    Gardiner Sleeper, M.D., Ph.D. Diseases & Surgery of the Retina and Vitreous [facility]    I have reviewed the above documentation for accuracy and completeness, and I agree with the above. Gardiner Sleeper, M.D., Ph.D. 05/12/18 1:15 PM    Abbreviations: M myopia (nearsighted); A astigmatism; H hyperopia (farsighted); P presbyopia; Mrx spectacle prescription;  CTL contact lenses; OD right eye; OS left eye; OU both eyes  XT exotropia; ET esotropia; PEK punctate epithelial keratitis; PEE punctate epithelial erosions; DES dry eye syndrome; MGD meibomian gland dysfunction; ATs artificial tears; PFAT's preservative free artificial tears; [place] nuclear sclerotic cataract; PSC posterior subcapsular cataract; ERM epi-retinal membrane; PVD posterior vitreous detachment; RD retinal detachment; DM diabetes mellitus; DR diabetic retinopathy; NPDR non-proliferative diabetic retinopathy; PDR proliferative diabetic retinopathy; CSME clinically significant macular edema; DME diabetic macular edema; dbh dot blot hemorrhages; CWS cotton wool spot; POAG primary open angle glaucoma; C/D cup-to-disc ratio; HVF humphrey visual field; GVF goldmann visual field; OCT optical coherence tomography; IOP intraocular pressure; BRVO Branch retinal vein occlusion;  CRVO central retinal vein occlusion; CRAO central retinal artery occlusion; BRAO branch retinal artery occlusion; RT retinal tear; SB scleral buckle; PPV pars plana vitrectomy; VH Vitreous hemorrhage; PRP panretinal laser photocoagulation; IVK intravitreal kenalog; VMT vitreomacular traction; MH Macular hole;  NVD neovascularization of the disc; NVE neovascularization elsewhere; AREDS age related eye disease study; ARMD age related macular degeneration; POAG primary open angle glaucoma; EBMD epithelial/anterior basement membrane dystrophy; ACIOL anterior chamber intraocular lens; IOL intraocular lens; PCIOL  posterior chamber intraocular lens; Phaco/IOL phacoemulsification with intraocular lens placement; [place] photorefractive keratectomy; LASIK laser assisted in situ keratomileusis; HTN hypertension; DM diabetes mellitus; COPD chronic obstructive pulmonary disease

## 2018-05-12 ENCOUNTER — Ambulatory Visit (INDEPENDENT_AMBULATORY_CARE_PROVIDER_SITE_OTHER): Payer: Medicare Other | Admitting: Ophthalmology

## 2018-05-12 ENCOUNTER — Encounter (INDEPENDENT_AMBULATORY_CARE_PROVIDER_SITE_OTHER): Payer: Self-pay | Admitting: Ophthalmology

## 2018-05-12 DIAGNOSIS — I10 Essential (primary) hypertension: Secondary | ICD-10-CM

## 2018-05-12 DIAGNOSIS — E113313 Type 2 diabetes mellitus with moderate nonproliferative diabetic retinopathy with macular edema, bilateral: Secondary | ICD-10-CM

## 2018-05-12 DIAGNOSIS — H35033 Hypertensive retinopathy, bilateral: Secondary | ICD-10-CM

## 2018-05-12 DIAGNOSIS — H3581 Retinal edema: Secondary | ICD-10-CM | POA: Diagnosis not present

## 2018-05-12 DIAGNOSIS — H25813 Combined forms of age-related cataract, bilateral: Secondary | ICD-10-CM

## 2018-05-12 MED ORDER — BEVACIZUMAB CHEMO INJECTION 1.25MG/0.05ML SYRINGE FOR KALEIDOSCOPE
1.2500 mg | INTRAVITREAL | Status: DC
  Administered 2018-05-12: 1.25 mg via INTRAVITREAL

## 2018-05-13 ENCOUNTER — Encounter (HOSPITAL_COMMUNITY): Payer: Self-pay | Admitting: Urology

## 2018-05-13 ENCOUNTER — Other Ambulatory Visit: Payer: Self-pay

## 2018-05-13 MED ORDER — VANCOMYCIN HCL 10 G IV SOLR
1500.0000 mg | INTRAVENOUS | Status: AC
  Administered 2018-05-14: 1500 mg via INTRAVENOUS
  Filled 2018-05-13: qty 1500

## 2018-05-13 NOTE — Anesthesia Preprocedure Evaluation (Addendum)
Anesthesia Evaluation  Patient identified by MRN, date of birth, ID band Patient awake    Reviewed: Allergy & Precautions, NPO status , Patient's Chart, lab work & pertinent test results, reviewed documented beta blocker date and time   Airway Mallampati: II  TM Distance: >3 FB Neck ROM: Full    Dental  (+) Teeth Intact, Dental Advisory Given, Missing   Pulmonary asthma ,    Pulmonary exam normal breath sounds clear to auscultation       Cardiovascular hypertension, Pt. on home beta blockers and Pt. on medications (-) angina+ CAD  (-) Past MI Normal cardiovascular exam Rhythm:Regular Rate:Normal  LV dysfunction (EF 40%) 02/2018  Non-ischemic stress test 02/2018   Neuro/Psych PSYCHIATRIC DISORDERS Depression  Neuromuscular disease    GI/Hepatic Neg liver ROS, GERD  ,  Endo/Other  diabetes, Type 2, Insulin DependentMorbid obesity  Renal/GU Renal InsufficiencyRenal disease (RCC; K+ 4.3)     Musculoskeletal  (+) Arthritis ,   Abdominal   Peds  Hematology  (+) Blood dyscrasia, anemia ,   Anesthesia Other Findings Day of surgery medications reviewed with the patient.  Reproductive/Obstetrics                          Anesthesia Physical Anesthesia Plan  ASA: III  Anesthesia Plan: General   Post-op Pain Management:    Induction:   PONV Risk Score and Plan: 3 and Midazolam, Dexamethasone and Ondansetron  Airway Management Planned: LMA  Additional Equipment:   Intra-op Plan:   Post-operative Plan: Extubation in OR  Informed Consent: I have reviewed the patients History and Physical, chart, labs and discussed the procedure including the risks, benefits and alternatives for the proposed anesthesia with the patient or authorized representative who has indicated his/her understanding and acceptance.   Dental advisory given  Plan Discussed with: CRNA  Anesthesia Plan Comments:        Anesthesia Quick Evaluation

## 2018-05-13 NOTE — Progress Notes (Addendum)
Anesthesia Chart Review: [name]  Case:  [id number] Date/Time:  05/14/18 0915   Procedure:  SECOND STAGE BASILIC VEIN TRANSPOSITION LEFT ARM (Left )   Anesthesia type:  Monitor Anesthesia Care   Pre-op diagnosis:  CHRONIC KIDNEY DISEASE FOR HEMODIALYSIS ACCESS   Location:  [facility] ROOM 11 / [facility]   Surgeon:  Serafina Mitchell, MD      DISCUSSION: Patient is a 67 year old female scheduled for the above procedure. 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 Stress test non-ischemic 02/2018).  She recently had a Myoview due to coronary calcifications on chest CT. Stress test was non-ischemic, but EF was 30-44%. Echo showed EF 40% with diffuse LV hypokinesis. Cardiologist Dr. Gwenlyn Found is uncertain of the etiology. She did report some palpitations. He started her on b-block medication and referred her to the HTN clinic. He is scheduled to see her in follow-up on 07/08/18.   She will get labs on arrival. If results acceptable and no acute symptoms then I would anticiapte that she can proceed as planned.   VS: Wt 114.3 kg Comment: 04/20/18  BMI 39.47 kg/m   PROVIDERS: [name], MD is PCP Quay Burow, MD is cardiologist. Last visit 04/07/18.  Selinda Orion, MD is urologist  LABS: For day of surgery.   EKG: 02/17/18: SR with occasional PVCs. LAD. Anterolateral infarct, age undetermined.   CV: Echo 03/02/18: Study Conclusions - Procedure narrative: Transthoracic echocardiography. Image   quality was poor. The study was technically difficult.   Intravenous contrast (Definity) was administered to opacify the   LV. - Left ventricle: The cavity size was normal. Wall thickness was   increased in a  pattern of mild LVH. The estimated ejection   fraction was 40%. Diffuse hypokinesis. Doppler parameters are   consistent with abnormal left ventricular relaxation (grade 1   diastolic dysfunction). - Aortic valve: There was no stenosis. - Aorta: Ascending aortic diameter: 37 mm (S). - Ascending aorta: The ascending aorta was borderline dilated. - Mitral valve: Mildly calcified annulus. There was no significant   regurgitation. - Left atrium: The atrium was mildly dilated. - Right ventricle: The cavity size was normal. Systolic function   was normal. - Right atrium: The atrium was mildly dilated. - Pulmonary arteries: No complete TR doppler jet so unable to   estimate PA systolic pressure. - Inferior vena cava: The vessel was normal in size. The   respirophasic diameter changes were in the normal range (>= 50%),   consistent with normal central venous pressure. - Pericardium, extracardiac: A trivial pericardial effusion was   identified posterior to the heart. Impressions: - Normal LV size with mild LV hypertrophy. EF 40%, diffuse   hypokinesis. Normal RV size and systolic function. No significant   valvular abnormalities.  Nuclear stress test 02/25/18:  The left ventricular ejection fraction is moderately decreased (30-44%).  Nuclear stress EF: 43%.  There was no ST segment deviation noted during stress.  This is a low risk study. Abnormal, low risk stress nuclear study with no ischemia or infarction.  Gated ejection fraction 43% with diffuse hypokinesis.  However LV function appears better than calculated ejection fraction.  Moderate left ventricular enlargement.  Suggest echocardiogram to further assess.   Past  Medical History:  Diagnosis Date  . Anemia   . Arthritis   . Asthma   . CKD stage 3 due to type 2 diabetes mellitus ([place])   . Depression   . Diabetic retinopathy of both eyes ([place])   . GERD (gastroesophageal reflux disease)    pt denies  . HLD (hyperlipidemia)    . Hypertension   . Microalbuminuria due to type 2 diabetes mellitus ([place])   . Neuromuscular disorder ([place])    diabetic neuropathy  . Nonproliferative retinopathy due to secondary diabetes ([place])   . Pneumonia    hx of   . Renal cell cancer ([place])   . 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 [facility])     Past Surgical History:  Procedure Laterality Date  . BASCILIC VEIN TRANSPOSITION Left 08/08/2017   Procedure: BASILIC VEIN TRANSPOSITION FIRST STAGE LEFT ARM;  Surgeon: Serafina Mitchell, MD;  Location: [facility];  Service: Vascular;  Laterality: Left;  . ROBOTIC ADRENALECTOMY Left 03/10/2017   Procedure: XI ROBOTIC ADRENALECTOMY;  Surgeon: Nickie Retort, MD;  Location: [facility];  Service: Urology;  Laterality: Left;  . ROBOTIC ASSITED PARTIAL NEPHRECTOMY Right 03/10/2017   Procedure: XI ROBOTIC ASSITED RADICAL NEPHRECTOMY;  Surgeon: Nickie Retort, MD;  Location: [facility];  Service: Urology;  Laterality: Right;  . TOE AMPUTATION Bilateral    both great toe ,, left foot 2nd toe 1/2    MEDICATIONS: . Bevacizumab (AVASTIN) SOLN 1.25 mg  . [START ON 05/14/2018] vancomycin (VANCOCIN) 1,500 mg in sodium chloride 0.9 % 500 mL IVPB   . amLODipine (NORVASC) 10 MG tablet  . BREO ELLIPTA 100-25 MCG/INH AEPB  . calcitRIOL (ROCALTROL) 0.25 MCG capsule  . carvedilol (COREG) 3.125 MG tablet  . CINNAMON PO  . Dulaglutide (TRULICITY) 4.45 HQ/6.0QN SOPN  . furosemide (LASIX) 20 MG tablet  . gabapentin (NEURONTIN) 300 MG capsule  . polyethylene glycol (MIRALAX / GLYCOLAX) packet  . PROAIR HFA 108 (90 Base) MCG/ACT inhaler  . rosuvastatin (CRESTOR) 20 MG tablet  . sodium bicarbonate 650 MG tablet  . TRADJENTA 5 MG TABS tablet  . TRESIBA FLEXTOUCH 100 UNIT/ML SOPN FlexTouch Pen  . VITAMIN A PO  . Blood Glucose Monitoring Suppl (ONE TOUCH ULTRA 2) w/Device KIT  . carvedilol (COREG) 6.25 MG tablet  . glucose blood (ONE TOUCH ULTRA TEST) test strip  . Lancets [facility] ULTRASOFT)  lancets    [name] [facility] Short Stay Center/Anesthesiology Phone [phone] 05/13/2018 3:23 PM

## 2018-05-13 NOTE — Progress Notes (Addendum)
Pt with no symptoms chest pain, SOB other than what is normal with her Asthma, denies cardiac history other than a "weak heart" which she was recently told by Dr. Gwenlyn Found. Pt is Diabetic. Pt instructed not to take Tradjenta in the AM, and to take half dose of Tresiba (70 units) unless blood sugar is too low.  Instructed to check CBG in AM, if CBG less than 70 than have 1/2 cup apple juice and re-check in 15 minutes. If not over 70 then call short stay. Pt verbalized understanding.   Notified Ebony Hail PA with anesthesia to review chart d/t abnormal EKG and EF 40%

## 2018-05-14 ENCOUNTER — Encounter (HOSPITAL_COMMUNITY): Payer: Self-pay

## 2018-05-14 ENCOUNTER — Ambulatory Visit (HOSPITAL_COMMUNITY): Payer: Medicare Other | Admitting: Vascular Surgery

## 2018-05-14 ENCOUNTER — Encounter (HOSPITAL_COMMUNITY)
Admission: RE | Disposition: A | Discharge status: Home / Self Care | Payer: Self-pay | Source: Ambulatory Visit | Attending: Surgery

## 2018-05-14 ENCOUNTER — Ambulatory Visit (HOSPITAL_COMMUNITY)
Admission: RE | Admit: 2018-05-14 | Discharge: 2018-05-14 | Disposition: A | Discharge status: Home / Self Care | Payer: Medicare Other | Source: Ambulatory Visit | Attending: Surgery | Admitting: Surgery

## 2018-05-14 DIAGNOSIS — Z89411 Acquired absence of right great toe: Secondary | ICD-10-CM | POA: Insufficient documentation

## 2018-05-14 DIAGNOSIS — J45909 Unspecified asthma, uncomplicated: Secondary | ICD-10-CM | POA: Diagnosis not present

## 2018-05-14 DIAGNOSIS — Z6839 Body mass index (BMI) 39.0-39.9, adult: Secondary | ICD-10-CM | POA: Insufficient documentation

## 2018-05-14 DIAGNOSIS — Z905 Acquired absence of kidney: Secondary | ICD-10-CM | POA: Insufficient documentation

## 2018-05-14 DIAGNOSIS — Z794 Long term (current) use of insulin: Secondary | ICD-10-CM | POA: Diagnosis not present

## 2018-05-14 DIAGNOSIS — N185 Chronic kidney disease, stage 5: Secondary | ICD-10-CM

## 2018-05-14 DIAGNOSIS — E11319 Type 2 diabetes mellitus with unspecified diabetic retinopathy without macular edema: Secondary | ICD-10-CM | POA: Insufficient documentation

## 2018-05-14 DIAGNOSIS — Z7951 Long term (current) use of inhaled steroids: Secondary | ICD-10-CM | POA: Diagnosis not present

## 2018-05-14 DIAGNOSIS — I129 Hypertensive chronic kidney disease with stage 1 through stage 4 chronic kidney disease, or unspecified chronic kidney disease: Secondary | ICD-10-CM | POA: Diagnosis not present

## 2018-05-14 DIAGNOSIS — Z89422 Acquired absence of other left toe(s): Secondary | ICD-10-CM | POA: Insufficient documentation

## 2018-05-14 DIAGNOSIS — E785 Hyperlipidemia, unspecified: Secondary | ICD-10-CM | POA: Diagnosis not present

## 2018-05-14 DIAGNOSIS — Z89412 Acquired absence of left great toe: Secondary | ICD-10-CM | POA: Diagnosis not present

## 2018-05-14 DIAGNOSIS — E1122 Type 2 diabetes mellitus with diabetic chronic kidney disease: Secondary | ICD-10-CM | POA: Insufficient documentation

## 2018-05-14 DIAGNOSIS — Z85528 Personal history of other malignant neoplasm of kidney: Secondary | ICD-10-CM | POA: Insufficient documentation

## 2018-05-14 DIAGNOSIS — E1140 Type 2 diabetes mellitus with diabetic neuropathy, unspecified: Secondary | ICD-10-CM | POA: Insufficient documentation

## 2018-05-14 DIAGNOSIS — Z79899 Other long term (current) drug therapy: Secondary | ICD-10-CM | POA: Insufficient documentation

## 2018-05-14 DIAGNOSIS — N184 Chronic kidney disease, stage 4 (severe): Secondary | ICD-10-CM | POA: Diagnosis not present

## 2018-05-14 DIAGNOSIS — D631 Anemia in chronic kidney disease: Secondary | ICD-10-CM | POA: Diagnosis not present

## 2018-05-14 DIAGNOSIS — N189 Chronic kidney disease, unspecified: Secondary | ICD-10-CM | POA: Diagnosis not present

## 2018-05-14 HISTORY — PX: BASCILIC VEIN TRANSPOSITION: SHX5742

## 2018-05-14 LAB — GLUCOSE, CAPILLARY
Glucose-Capillary: 89 mg/dL (ref 70–99)
Glucose-Capillary: 52 mg/dL — ABNORMAL LOW (ref 70–99)
Glucose-Capillary: 62 mg/dL — ABNORMAL LOW (ref 70–99)
Glucose-Capillary: 109 mg/dL — ABNORMAL HIGH (ref 70–99)

## 2018-05-14 LAB — POCT I-STAT 4, (NA,K, GLUC, HGB,HCT)
Glucose, Bld: 114 mg/dL — ABNORMAL HIGH (ref 70–99)
HCT: 30 % — ABNORMAL LOW (ref 36.0–46.0)
Hemoglobin: 10.2 g/dL — ABNORMAL LOW (ref 12.0–15.0)
Potassium: 4.3 mmol/L (ref 3.5–5.1)
Sodium: 142 mmol/L (ref 135–145)

## 2018-05-14 SURGERY — TRANSPOSITION, VEIN, BASILIC
Anesthesia: General | Site: Arm Upper | Laterality: Left

## 2018-05-14 MED ORDER — SODIUM CHLORIDE 0.9 % IV SOLN
INTRAVENOUS | Status: AC
  Filled 2018-05-14: qty 1.2

## 2018-05-14 MED ORDER — DEXTROSE 50 % IV SOLN
25.0000 mL | Freq: Once | INTRAVENOUS | Status: AC
  Administered 2018-05-14: 25 mL via INTRAVENOUS

## 2018-05-14 MED ORDER — ONDANSETRON HCL 4 MG/2ML IJ SOLN
INTRAMUSCULAR | Status: DC | PRN
  Administered 2018-05-14: 4 mg via INTRAVENOUS

## 2018-05-14 MED ORDER — FENTANYL CITRATE (PF) 100 MCG/2ML IJ SOLN: 25.0000 ug | INTRAMUSCULAR | Status: DC | PRN

## 2018-05-14 MED ORDER — SODIUM CHLORIDE 0.9 % IV SOLN
INTRAVENOUS | Status: DC | PRN
  Administered 2018-05-14: 500 mL

## 2018-05-14 MED ORDER — FENTANYL CITRATE (PF) 250 MCG/5ML IJ SOLN
INTRAMUSCULAR | Status: AC
  Filled 2018-05-14: qty 5

## 2018-05-14 MED ORDER — PROPOFOL 10 MG/ML IV BOLUS
INTRAVENOUS | Status: DC | PRN
  Administered 2018-05-14: 100 mg via INTRAVENOUS

## 2018-05-14 MED ORDER — FENTANYL CITRATE (PF) 100 MCG/2ML IJ SOLN
INTRAMUSCULAR | Status: DC | PRN
  Administered 2018-05-14: 50 ug via INTRAVENOUS

## 2018-05-14 MED ORDER — LIDOCAINE-EPINEPHRINE (PF) 1 %-1:200000 IJ SOLN
INTRAMUSCULAR | Status: AC
  Filled 2018-05-14: qty 30

## 2018-05-14 MED ORDER — ONDANSETRON HCL 4 MG/2ML IJ SOLN
INTRAMUSCULAR | Status: AC
  Filled 2018-05-14: qty 2

## 2018-05-14 MED ORDER — ONDANSETRON HCL 4 MG/2ML IJ SOLN: 4.0000 mg | Freq: Once | INTRAMUSCULAR | Status: DC | PRN

## 2018-05-14 MED ORDER — SODIUM CHLORIDE 0.9 % IV SOLN
INTRAVENOUS | Status: DC
  Administered 2018-05-14: 10:00:00 via INTRAVENOUS

## 2018-05-14 MED ORDER — OXYCODONE-ACETAMINOPHEN 5-325 MG PO TABS: 1.0000 | ORAL_TABLET | Freq: Four times a day (QID) | ORAL | 0 refills | Status: DC | PRN

## 2018-05-14 MED ORDER — EPHEDRINE 5 MG/ML INJ
INTRAVENOUS | Status: AC
  Filled 2018-05-14: qty 10

## 2018-05-14 MED ORDER — PHENYLEPHRINE HCL 10 MG/ML IJ SOLN
INTRAMUSCULAR | Status: DC | PRN
  Administered 2018-05-14 (×2): 80 ug via INTRAVENOUS

## 2018-05-14 MED ORDER — HEMOSTATIC AGENTS (NO CHARGE) OPTIME
TOPICAL | Status: DC | PRN
  Administered 2018-05-14: 1 via TOPICAL

## 2018-05-14 MED ORDER — DEXTROSE 50 % IV SOLN
INTRAVENOUS | Status: AC
  Filled 2018-05-14: qty 50

## 2018-05-14 MED ORDER — 0.9 % SODIUM CHLORIDE (POUR BTL) OPTIME
TOPICAL | Status: DC | PRN
  Administered 2018-05-14: 1000 mL

## 2018-05-14 MED ORDER — MIDAZOLAM HCL 5 MG/5ML IJ SOLN
INTRAMUSCULAR | Status: DC | PRN
  Administered 2018-05-14 (×2): 1 mg via INTRAVENOUS

## 2018-05-14 MED ORDER — CHLORHEXIDINE GLUCONATE 4 % EX LIQD: 60.0000 mL | Freq: Once | CUTANEOUS | Status: DC

## 2018-05-14 MED ORDER — LIDOCAINE 2% (20 MG/ML) 5 ML SYRINGE
INTRAMUSCULAR | Status: AC
  Filled 2018-05-14: qty 5

## 2018-05-14 MED ORDER — MIDAZOLAM HCL 2 MG/2ML IJ SOLN
INTRAMUSCULAR | Status: AC
  Filled 2018-05-14: qty 2

## 2018-05-14 MED ORDER — PROPOFOL 10 MG/ML IV BOLUS
INTRAVENOUS | Status: AC
  Filled 2018-05-14: qty 40

## 2018-05-14 MED ORDER — PHENYLEPHRINE 40 MCG/ML (10ML) SYRINGE FOR IV PUSH (FOR BLOOD PRESSURE SUPPORT)
PREFILLED_SYRINGE | INTRAVENOUS | Status: AC
  Filled 2018-05-14: qty 10

## 2018-05-14 MED ORDER — EPHEDRINE SULFATE 50 MG/ML IJ SOLN
INTRAMUSCULAR | Status: DC | PRN
  Administered 2018-05-14 (×2): 10 mg via INTRAVENOUS

## 2018-05-14 MED ORDER — PROPOFOL 10 MG/ML IV BOLUS
INTRAVENOUS | Status: AC
  Filled 2018-05-14: qty 20

## 2018-05-14 MED ORDER — LIDOCAINE 2% (20 MG/ML) 5 ML SYRINGE
INTRAMUSCULAR | Status: DC | PRN
  Administered 2018-05-14: 80 mg via INTRAVENOUS

## 2018-05-14 SURGICAL SUPPLY — 42 items
ADH SKN CLS APL DERMABOND .7 (GAUZE/BANDAGES/DRESSINGS) ×1
ADH SKN CLS LQ APL DERMABOND (GAUZE/BANDAGES/DRESSINGS) ×1
ARMBAND PINK RESTRICT EXTREMIT (MISCELLANEOUS) ×3 IMPLANT
CANISTER SUCT 3000ML PPV (MISCELLANEOUS) ×3 IMPLANT
CLIP VESOCCLUDE MED 24/CT (CLIP) IMPLANT
CLIP VESOCCLUDE MED 6/CT (CLIP) IMPLANT
CLIP VESOCCLUDE SM WIDE 24/CT (CLIP) ×2 IMPLANT
CLIP VESOCCLUDE SM WIDE 6/CT (CLIP) ×2 IMPLANT
COVER PROBE W GEL 5X96 (DRAPES) ×3 IMPLANT
COVER WAND RF STERILE (DRAPES) ×3 IMPLANT
DERMABOND ADHESIVE PROPEN (GAUZE/BANDAGES/DRESSINGS) ×2
DERMABOND ADVANCED (GAUZE/BANDAGES/DRESSINGS) ×2
DERMABOND ADVANCED .7 DNX12 (GAUZE/BANDAGES/DRESSINGS) ×1 IMPLANT
DERMABOND ADVANCED .7 DNX6 (GAUZE/BANDAGES/DRESSINGS) IMPLANT
ELECT REM PT RETURN 9FT ADLT (ELECTROSURGICAL) ×3
ELECTRODE REM PT RTRN 9FT ADLT (ELECTROSURGICAL) ×1 IMPLANT
GLOVE BIOGEL PI IND STRL 6.5 (GLOVE) IMPLANT
GLOVE BIOGEL PI IND STRL 7.5 (GLOVE) ×1 IMPLANT
GLOVE BIOGEL PI INDICATOR 6.5 (GLOVE) ×2
GLOVE BIOGEL PI INDICATOR 7.5 (GLOVE) ×2
GLOVE SURG SS PI 6.5 STRL IVOR (GLOVE) ×2 IMPLANT
GLOVE SURG SS PI 7.5 STRL IVOR (GLOVE) ×3 IMPLANT
GOWN STRL NON-REIN LRG LVL3 (GOWN DISPOSABLE) ×2 IMPLANT
GOWN STRL REUS W/ TWL LRG LVL3 (GOWN DISPOSABLE) ×2 IMPLANT
GOWN STRL REUS W/ TWL XL LVL3 (GOWN DISPOSABLE) ×1 IMPLANT
GOWN STRL REUS W/TWL LRG LVL3 (GOWN DISPOSABLE) ×6
GOWN STRL REUS W/TWL XL LVL3 (GOWN DISPOSABLE) ×3
HEMOSTAT SNOW SURGICEL 2X4 (HEMOSTASIS) ×2 IMPLANT
KIT BASIN OR (CUSTOM PROCEDURE TRAY) ×3 IMPLANT
KIT TURNOVER KIT B (KITS) ×3 IMPLANT
NS IRRIG 1000ML POUR BTL (IV SOLUTION) ×3 IMPLANT
PACK CV ACCESS (CUSTOM PROCEDURE TRAY) ×3 IMPLANT
PAD ARMBOARD 7.5X6 YLW CONV (MISCELLANEOUS) ×6 IMPLANT
SUT MNCRL AB 4-0 PS2 18 (SUTURE) ×2 IMPLANT
SUT PROLENE 6 0 CC (SUTURE) ×5 IMPLANT
SUT SILK 2 0 SH (SUTURE) IMPLANT
SUT VIC AB 3-0 SH 27 (SUTURE) ×6
SUT VIC AB 3-0 SH 27X BRD (SUTURE) ×1 IMPLANT
SUT VICRYL 4-0 PS2 18IN ABS (SUTURE) ×3 IMPLANT
TOWEL GREEN STERILE (TOWEL DISPOSABLE) ×3 IMPLANT
UNDERPAD 30X30 (UNDERPADS AND DIAPERS) ×3 IMPLANT
WATER STERILE IRR 1000ML POUR (IV SOLUTION) ×3 IMPLANT

## 2018-05-14 NOTE — Anesthesia Procedure Notes (Addendum)
Procedure Name: LMA Insertion Date/Time: 05/14/2018 10:58 AM Performed by: Scheryl Darter, CRNA Pre-anesthesia Checklist: Patient identified, Emergency Drugs available, Suction available and Patient being monitored Patient Re-evaluated:Patient Re-evaluated prior to induction Oxygen Delivery Method: Circle System Utilized Preoxygenation: Pre-oxygenation with 100% oxygen Induction Type: IV induction Ventilation: Mask ventilation without difficulty LMA: LMA inserted LMA Size: 4.0 Number of attempts: 1 Airway Equipment and Method: Bite block Placement Confirmation: positive ETCO2 Tube secured with: Tape Dental Injury: Teeth and Oropharynx as per pre-operative assessment

## 2018-05-14 NOTE — Interval H&P Note (Signed)
History and Physical Interval Note:  05/14/2018 2:20 PM  [name]  has presented today for surgery, with the diagnosis of CHRONIC KIDNEY DISEASE FOR HEMODIALYSIS ACCESS  The various methods of treatment have been discussed with the patient and family. After consideration of risks, benefits and other options for treatment, the patient has consented to  Procedure(s): SECOND STAGE BASILIC VEIN TRANSPOSITION LEFT ARM (Left) as a surgical intervention .  The patient's history has been reviewed, patient examined, no change in status, stable for surgery.  I have reviewed the patient's chart and labs.  Questions were answered to the patient's satisfaction.     Annamarie Major

## 2018-05-14 NOTE — Transfer of Care (Signed)
Immediate Anesthesia Transfer of Care Note  Patient: [name]  Procedure(s) Performed: SECOND STAGE BASILIC VEIN TRANSPOSITION LEFT ARM (Left Arm Upper)  Patient Location: PACU  Anesthesia Type:General  Level of Consciousness: awake, alert , oriented and sedated  Airway & Oxygen Therapy: Patient Spontanous Breathing and Patient connected to nasal cannula oxygen  Post-op Assessment: Report given to RN, Post -op Vital signs reviewed and stable and Patient moving all extremities  Post vital signs: Reviewed and stable  Last Vitals:  Vitals Value Taken Time  BP 139/61 05/14/2018 12:28 PM  Temp    Pulse 71 05/14/2018 12:30 PM  Resp 19 05/14/2018 12:30 PM  SpO2 99 % 05/14/2018 12:30 PM  Vitals shown include unvalidated device data.  Last Pain:  Vitals:   05/14/18 0740  TempSrc: Oral  PainSc: 0-No pain      Patients Stated Pain Goal: 0 (75/91/63 8466)  Complications: No apparent anesthesia complications

## 2018-05-14 NOTE — Anesthesia Procedure Notes (Signed)
Performed by: Ashritha Desrosiers E, CRNA       

## 2018-05-14 NOTE — Op Note (Signed)
    Patient name: [name] MRN: [id number] DOB: 01/20/1951 Sex: female  05/14/2018 Pre-operative Diagnosis: Chronic renal insufficiency Post-operative diagnosis:  Same Surgeon:  Annamarie Major Assistants: [place] Cellar Procedure:   Second stage left basilic vein fistula Anesthesia: General Blood Loss: 50 cc Specimens: None  Findings:  ] The patient's basilic vein was bifid in the midportion.  The vein was of adequate caliber throughout.  It was transected near the arterial anastomosis, re-tunneled, and primary anastomosis was performed  Indications: The patient comes in today for second stage basilic vein fistula  Procedure:  The patient was identified in the holding area and taken to [place] 11  The patient was then placed supine on the table. general anesthesia was administered.  The patient was prepped and draped in the usual sterile fashion.  A time out was called and antibiotics were administered.  2 longitudinal incisions were made in the left upper arm.  Through these incisions I dissected out the basilic vein.  Sensory nerve wrapped around the vein was protected throughout.  The patient had a bifid basilic vein in the midportion.  Once I had adequate exposure, the vein was marked for orientation and then transected near the arterial anastomosis.  I then created a subcutaneous tunnel anteriorly with a curved tunneler.  The vein was brought through the tunnel and a end to end anastomosis was created.  Once the anastomosis was completed, the clamps were released.  The vein distended nicely.  There was an excellent thrill within the fistula.  The wound was then irrigated and hemostasis was achieved.  The fascia was then reapproximated with 3-0 Vicryl and skin was closed with 4-0 Vicryl and Dermabond.  There were no immediate complications.   Disposition: To PACU stable   V. Annamarie Major, M.D. [facility] Office: [phone] Pager:  [phone]

## 2018-05-14 NOTE — Discharge Instructions (Signed)
° °  [facility]  Discharge Instructions  AV Fistula or Graft Surgery for Dialysis Access  Please refer to the following instructions for your post-procedure care. Your surgeon or physician assistant will discuss any changes with you.  Activity  You may drive the day following your surgery, if you are comfortable and no longer taking prescription pain medication. Resume full activity as the soreness in your incision resolves.  Bathing/Showering  You may shower after you go home. Keep your incision dry for 48 hours. Do not soak in a bathtub, hot tub, or swim until the incision heals completely. You may not shower if you have a hemodialysis catheter.  Incision Care  Clean your incision with mild soap and water after 48 hours. Pat the area dry with a clean towel. You do not need a bandage unless otherwise instructed. Do not apply any ointments or creams to your incision. You may have skin glue on your incision. Do not peel it off. It will come off on its own in about one week. Your arm may swell a bit after surgery. To reduce swelling use pillows to elevate your arm so it is above your heart. Your doctor will tell you if you need to lightly wrap your arm with an ACE bandage.  Diet  Resume your normal diet. There are not special food restrictions following this procedure. In order to heal from your surgery, it is CRITICAL to get adequate nutrition. Your body requires vitamins, minerals, and protein. Vegetables are the best source of vitamins and minerals. Vegetables also provide the perfect balance of protein. Processed food has little nutritional value, so try to avoid this.  Medications  Resume taking all of your medications. If your incision is causing pain, you may take over-the counter pain relievers such as acetaminophen (Tylenol). If you were prescribed a stronger pain medication, please be aware these medications can cause nausea and constipation. Prevent  nausea by taking the medication with a snack or meal. Avoid constipation by drinking plenty of fluids and eating foods with high amount of fiber, such as fruits, vegetables, and grains.  Do not take Tylenol if you are taking prescription pain medications.  Follow up Your surgeon may want to see you in the office following your access surgery. If so, this will be arranged at the time of your surgery.  Please call us immediately for any of the following conditions:  Increased pain, redness, drainage (pus) from your incision site Fever of 101 degrees or higher Severe or worsening pain at your incision site Hand pain or numbness.  Reduce your risk of vascular disease:  Stop smoking. If you would like help, call [facility] at 1-800-QUIT-NOW 5188778471) or [place] at [place] your cholesterol Maintain a desired weight Control your diabetes Keep your blood pressure down  Dialysis  It will take several weeks to several months for your new dialysis access to be ready for use. Your surgeon will determine when it is okay to use it. Your nephrologist will continue to direct your dialysis. You can continue to use your Permcath until your new access is ready for use.   05/14/2018 [name] [id number] 1951-07-03  Surgeon(s): Serafina Mitchell, MD  Procedure(s): SECOND STAGE BASILIC VEIN TRANSPOSITION LEFT ARM  x Do not stick fistula for 4 weeks    If you have any questions, please call the office at [phone].

## 2018-05-15 ENCOUNTER — Encounter (HOSPITAL_COMMUNITY): Payer: Self-pay | Admitting: Surgery

## 2018-05-17 ENCOUNTER — Other Ambulatory Visit: Payer: Self-pay | Admitting: Family Medicine

## 2018-05-20 ENCOUNTER — Ambulatory Visit: Payer: Medicare Other | Admitting: Cardiovascular Disease

## 2018-05-22 NOTE — Anesthesia Postprocedure Evaluation (Signed)
Anesthesia Post Note  Patient: [name]  Procedure(s) Performed: SECOND STAGE BASILIC VEIN TRANSPOSITION LEFT ARM (Left Arm Upper)     Patient location during evaluation: PACU Anesthesia Type: General Level of consciousness: awake and alert Pain management: pain level controlled Vital Signs Assessment: post-procedure vital signs reviewed and stable Respiratory status: spontaneous breathing, nonlabored ventilation, respiratory function stable and patient connected to nasal cannula oxygen Cardiovascular status: blood pressure returned to baseline and stable Postop Assessment: no apparent nausea or vomiting Anesthetic complications: no    Last Vitals:  Vitals:   05/14/18 1400 05/14/18 1415  BP: 130/77 132/73  Pulse: 69 70  Resp:    Temp:  36.6 C  SpO2: 95% 96%    Last Pain:  Vitals:   05/14/18 1415  TempSrc:   PainSc: 0-No pain                 Catalina Gravel

## 2018-06-01 ENCOUNTER — Ambulatory Visit (INDEPENDENT_AMBULATORY_CARE_PROVIDER_SITE_OTHER): Payer: Medicare Other | Admitting: Physician Assistant

## 2018-06-01 ENCOUNTER — Other Ambulatory Visit: Payer: Self-pay

## 2018-06-01 VITALS — BP 169/84 | HR 72 | Temp 97.3°F | Resp 18 | Ht 67.0 in | Wt 249.0 lb

## 2018-06-01 DIAGNOSIS — N185 Chronic kidney disease, stage 5: Secondary | ICD-10-CM

## 2018-06-01 NOTE — Progress Notes (Signed)
POST OPERATIVE OFFICE NOTE    CC:  F/u for surgery  HPI:  [name] is a 67 y.o. female who returns for follow-up.  She is status post left second stage basilic fistula creation.  She denise pain, numbness or loss of motor in the left UE.  She does have chronic left UE edema since Jan 2019 and B peripheral neuropathy secondary to DM.    Allergies  Allergen Reactions  . Penicillins Other (See Comments)    UNSPECIFIED CHILDHOOD REACTION  Has patient had a PCN reaction causing immediate rash, facial/tongue/throat swelling, SOB or lightheadedness with hypotension: Unknown Has patient had a PCN reaction causing severe rash involving mucus membranes or skin necrosis: Unknown Has patient had a PCN reaction that required hospitalization: Unknown Has patient had a PCN reaction occurring within the last 10 years: Unknown If all of the above answers are "NO", then may proceed with Cephalosporin use.   . Adhesive [Tape] Rash    Current Outpatient Medications  Medication Sig Dispense Refill  . amLODipine (NORVASC) 10 MG tablet TAKE ONE TABLET BY MOUTH ONCE DAILY (Patient taking differently: Take 5 mg by mouth daily. ) 90 tablet 3  . Blood Glucose Monitoring Suppl (ONE TOUCH ULTRA 2) w/Device KIT Use to check BS BID-QID Dx:E11.9 1 each 1  . BREO ELLIPTA 100-25 MCG/INH AEPB INHALE 1 PUFF BY MOUTH ONCE DAILY (Patient taking differently: Inhale 1 puff into the lungs daily. ) 60 each 11  . calcitRIOL (ROCALTROL) 0.25 MCG capsule Take 0.25 mcg by mouth daily.    . carvedilol (COREG) 3.125 MG tablet Take 6.25 mg by mouth 2 (two) times daily.    . carvedilol (COREG) 6.25 MG tablet Take 1 tablet (6.25 mg total) by mouth 2 (two) times daily. 60 tablet 1  . CINNAMON PO Take 350 mg by mouth daily.     . Dulaglutide (TRULICITY) 0.25 [place]/7.0WC SOPN Inject 0.75 mg into the skin once a week. (Patient taking differently: Inject 0.75 mg into the skin every Wednesday. ) 5 pen 5  . furosemide (LASIX) 20 MG tablet  TAKE 1 TABLET BY MOUTH ONCE DAILY 90 tablet 1  . gabapentin (NEURONTIN) 300 MG capsule TAKE 1 CAPSULE BY MOUTH THREE TIMES DAILY (Patient taking differently: Take 300 mg by mouth 3 (three) times daily as needed (for pain/neuropathy in feet/hands.). ) 90 capsule 2  . glucose blood (ONE TOUCH ULTRA TEST) test strip Use as instructed 300 each 3  . Lancets (ONETOUCH ULTRASOFT) lancets Use as instructed 300 each 3  . polyethylene glycol (MIRALAX / GLYCOLAX) packet Take 17 g by mouth daily as needed for mild constipation.    Marland Kitchen PROAIR HFA 108 (90 Base) MCG/ACT inhaler INHALE TWO PUFFS INTO LUNGS EVERY 6 HOURS AS NEEDED FOR WHEEZING AND FOR SHORTNESS OF BREATH (Patient taking differently: Inhale 2 puffs into the lungs every 6 (six) hours as needed for wheezing or shortness of breath. ) 9 each 4  . rosuvastatin (CRESTOR) 20 MG tablet Take 1 tablet (20 mg total) by mouth daily. (Patient taking differently: Take 20 mg by mouth every evening. ) 90 tablet 3  . sodium bicarbonate 650 MG tablet Take 650 mg by mouth 2 (two) times daily.  12  . TRADJENTA 5 MG TABS tablet TAKE 1 TABLET BY MOUTH ONCE DAILY (Patient taking differently: Take 5 mg by mouth daily. ) 90 tablet 2  . TRESIBA FLEXTOUCH 100 UNIT/ML SOPN FlexTouch Pen INJECT 150 UNITS SUBCUTANEOUSLY ONCE DAILY 15 mL 2  .  VITAMIN A PO Take 2,400 Units by mouth daily.    Marland Kitchen oxyCODONE-acetaminophen (PERCOCET) 5-325 MG tablet Take 1 tablet by mouth every 6 (six) hours as needed for severe pain. (Patient not taking: Reported on 06/01/2018) 20 tablet 0   Current Facility-Administered Medications  Medication Dose Route Frequency Provider Last Rate Last Dose  . Bevacizumab (AVASTIN) SOLN 1.25 mg  1.25 mg Intravitreal  [name], MD   1.25 mg at 05/12/18 1319     ROS:  See HPI  Physical Exam:  Vitals:   06/01/18 1402  BP: (!) 169/84  Pulse: 72  Resp: 18  Temp: (!) 97.3 F (36.3 C)  SpO2: 98%    Incision:  Well healed Extremities:   Palpable distal  radial pulse, palpable thrill throughout the fistula.  Left UE general edema without pain to palpation.  Full range of motion in left UE.  Grip 5/5 and equal B.   Assessment/Plan:  This is a 67 y.o. female who is s/p: Second stage basilic fistula creation 05/14/2018   She is currently not on HD.  If she does have to start HD the fistula may be accessed as needed.  She will continue to elevate her left UE as needed.   Roxy Horseman PA-C [facility] [phone]

## 2018-06-03 DIAGNOSIS — D631 Anemia in chronic kidney disease: Secondary | ICD-10-CM | POA: Diagnosis not present

## 2018-06-03 DIAGNOSIS — N2581 Secondary hyperparathyroidism of renal origin: Secondary | ICD-10-CM | POA: Diagnosis not present

## 2018-06-03 DIAGNOSIS — D509 Iron deficiency anemia, unspecified: Secondary | ICD-10-CM | POA: Diagnosis not present

## 2018-06-03 DIAGNOSIS — N184 Chronic kidney disease, stage 4 (severe): Secondary | ICD-10-CM | POA: Diagnosis not present

## 2018-06-03 DIAGNOSIS — E1122 Type 2 diabetes mellitus with diabetic chronic kidney disease: Secondary | ICD-10-CM | POA: Diagnosis not present

## 2018-06-04 ENCOUNTER — Ambulatory Visit (INDEPENDENT_AMBULATORY_CARE_PROVIDER_SITE_OTHER): Payer: Medicare Other | Admitting: Pharmacist Clinician (PhC)/ Clinical Pharmacy Specialist

## 2018-06-04 DIAGNOSIS — I519 Heart disease, unspecified: Secondary | ICD-10-CM | POA: Diagnosis not present

## 2018-06-04 NOTE — Progress Notes (Signed)
06/05/2018 [name] [birth year] [id number]   HPI:  [name] is a 67 y.o. female patient of Dr Gwenlyn Found, with a PMH below who presents today for heart failure medication titration.  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  At her last visit with me she was noted to have an elevated blood pressure, but she admitted to being stressed, as she was due to have surgery for her dialysis shunt within a few days.  She also noted some swelling in her legs, although not daily.  At that time we decreased the amlodipine from 10 to 5 mg daily and increased the carvedilol to 12.5 mg bid.  Today she returns for follow up.  She is feeling much better, the surgical procedure is past and she was told their was slight improvement in her SCr at the most recent follow up with nephrology.   There has been no more swelling in her legs.    Blood Pressure Goal:  130/80  Current Medications:  Carvedilol 12.5 mg bid  Amlodipine 5 mg mg qd   Family Hx:  Father had CAD, heart disease    Brother died from aneurysm, stroke, had MI, stents, DM  Mother - DM  2 sons, oldest has already had stents around age 99  Social Hx:  To tobacco, rare alcohol, drinks hot tea and only occasional coffee  Diet:  Eats out regularly; mix of fast food, sit down, admits to fast food at least once daily.  Tried to work out with sister to buy groceries and cook at home, but they did not do well with this plan  Exercise:  Too many leg issues, loses balance easily  Home BP readings:  No home readings with her today, notes she checked a few times, but always <629 systolic.    Intolerances:   No cardiac medication intolerances  Labs:  04/02/18:  Na 143, K 4.7, Glu 113, BUN 57, SCr 3.75 (CrCl 26.3) Wt Readings from Last 3 Encounters:  06/01/18 249 lb (112.9 kg)  05/14/18  251 lb 15.8 oz (114.3 kg)  04/20/18 252 lb (114.3 kg)   BP Readings from Last 3 Encounters:  06/04/18 128/62  06/01/18 (!) 169/84  05/14/18 132/73   Pulse Readings from Last 3 Encounters:  06/04/18 70  06/01/18 72  05/14/18 70    Current Outpatient Medications  Medication Sig Dispense Refill  . amLODipine (NORVASC) 10 MG tablet TAKE ONE TABLET BY MOUTH ONCE DAILY (Patient taking differently: Take 5 mg by mouth daily. ) 90 tablet 3  . Blood Glucose Monitoring Suppl (ONE TOUCH ULTRA 2) w/Device KIT Use to check BS BID-QID Dx:E11.9 1 each 1  . BREO ELLIPTA 100-25 MCG/INH AEPB INHALE 1 PUFF BY MOUTH ONCE DAILY (Patient taking differently: Inhale 1 puff into the lungs daily. ) 60 each 11  . calcitRIOL (ROCALTROL) 0.25 MCG capsule Take 0.25 mcg by mouth daily.    . carvedilol (COREG) 3.125 MG tablet Take 6.25 mg by mouth 2 (two) times daily.    . carvedilol (COREG) 6.25 MG tablet Take 1 tablet (6.25 mg total) by mouth 2 (two) times daily. 60 tablet 1  . CINNAMON PO Take 350 mg by mouth daily.     . Dulaglutide (TRULICITY) 5.28 UX/3.2GM SOPN Inject 0.75 mg into the skin once a week. (Patient taking differently: Inject 0.75  mg into the skin every Wednesday. ) 5 pen 5  . furosemide (LASIX) 20 MG tablet TAKE 1 TABLET BY MOUTH ONCE DAILY 90 tablet 1  . gabapentin (NEURONTIN) 300 MG capsule TAKE 1 CAPSULE BY MOUTH THREE TIMES DAILY (Patient taking differently: Take 300 mg by mouth 3 (three) times daily as needed (for pain/neuropathy in feet/hands.). ) 90 capsule 2  . glucose blood (ONE TOUCH ULTRA TEST) test strip Use as instructed 300 each 3  . Lancets (ONETOUCH ULTRASOFT) lancets Use as instructed 300 each 3  . oxyCODONE-acetaminophen (PERCOCET) 5-325 MG tablet Take 1 tablet by mouth every 6 (six) hours as needed for severe pain. (Patient not taking: Reported on 06/01/2018) 20 tablet 0  . polyethylene glycol (MIRALAX / GLYCOLAX) packet Take 17 g by mouth daily as needed for mild constipation.      Marland Kitchen PROAIR HFA 108 (90 Base) MCG/ACT inhaler INHALE TWO PUFFS INTO LUNGS EVERY 6 HOURS AS NEEDED FOR WHEEZING AND FOR SHORTNESS OF BREATH (Patient taking differently: Inhale 2 puffs into the lungs every 6 (six) hours as needed for wheezing or shortness of breath. ) 9 each 4  . rosuvastatin (CRESTOR) 20 MG tablet Take 1 tablet (20 mg total) by mouth daily. (Patient taking differently: Take 20 mg by mouth every evening. ) 90 tablet 3  . sodium bicarbonate 650 MG tablet Take 650 mg by mouth 2 (two) times daily.  12  . TRADJENTA 5 MG TABS tablet TAKE 1 TABLET BY MOUTH ONCE DAILY (Patient taking differently: Take 5 mg by mouth daily. ) 90 tablet 2  . TRESIBA FLEXTOUCH 100 UNIT/ML SOPN FlexTouch Pen INJECT 150 UNITS SUBCUTANEOUSLY ONCE DAILY 15 mL 2  . VITAMIN A PO Take 2,400 Units by mouth daily.     Current Facility-Administered Medications  Medication Dose Route Frequency Provider Last Rate Last Dose  . Bevacizumab (AVASTIN) SOLN 1.25 mg  1.25 mg Intravitreal  [name], MD   1.25 mg at 05/12/18 1319    Allergies  Allergen Reactions  . Penicillins Other (See Comments)    UNSPECIFIED CHILDHOOD REACTION  Has patient had a PCN reaction causing immediate rash, facial/tongue/throat swelling, SOB or lightheadedness with hypotension: Unknown Has patient had a PCN reaction causing severe rash involving mucus membranes or skin necrosis: Unknown Has patient had a PCN reaction that required hospitalization: Unknown Has patient had a PCN reaction occurring within the last 10 years: Unknown If all of the above answers are "NO", then may proceed with Cephalosporin use.   . Adhesive [Tape] Rash    Past Medical History:  Diagnosis Date  . Anemia   . Arthritis   . Asthma   . CKD stage 3 due to type 2 diabetes mellitus ([place])   . Depression   . Diabetic retinopathy of both eyes ([place])   . GERD (gastroesophageal reflux disease)    pt denies  . HLD (hyperlipidemia)   . Hypertension   .  Microalbuminuria due to type 2 diabetes mellitus ([place])   . Neuromuscular disorder ([place])    diabetic neuropathy  . Nonproliferative retinopathy due to secondary diabetes ([place])   . Pneumonia    hx of   . Renal cell cancer ([place])   . 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  Left ventricular dysfunction Patient is doing well today and tolerating her medications without problem.  Her pressure is good today, so I will not make any additional changes to her  regimen.  Because she will be followed closely by nephrology in the future, I have advised her to let them continue to make any changes in her blood pressure medications as they see fit.  She knows she can call should she have any concerns.     Tommy Medal PharmD CPP [facility] [address] [place], [place] [address] [phone]

## 2018-06-04 NOTE — Patient Instructions (Signed)
Your blood pressure today is 128/62  Check your blood pressure at home daily and keep record of the readings.  Take your BP meds as follows:  Continue with your current medication  Bring all of your meds, your BP cuff and your record of home blood pressures to your next appointment.  Exercise as you're able, try to walk approximately 30 minutes per day.  Keep salt intake to a minimum, especially watch canned and prepared boxed foods.  Eat more fresh fruits and vegetables and fewer canned items.  Avoid eating in fast food restaurants.    HOW TO TAKE YOUR BLOOD PRESSURE: . Rest 5 minutes before taking your blood pressure. .  Don't smoke or drink caffeinated beverages for at least 30 minutes before. . Take your blood pressure before (not after) you eat. . Sit comfortably with your back supported and both feet on the floor (don't cross your legs). . Elevate your arm to heart level on a table or a desk. . Use the proper sized cuff. It should fit smoothly and snugly around your bare upper arm. There should be enough room to slip a fingertip under the cuff. The bottom edge of the cuff should be 1 inch above the crease of the elbow. . Ideally, take 3 measurements at one sitting and record the average.   Dialysis Diet Dialysis is a treatment that you may undergo if you have significant damage to your kidneys. Dialysis replaces some of the work that the kidneys do. One of the jobs that it takes over is removing wastes, salt, and extra water from your blood. This helps to keep the amount of potassium and other nutrients in your blood at healthy levels. When you need dialysis, it is important to pay careful attention to your diet. Between dialysis sessions, certain nutrients can build up in your blood and cause you to get sick. Vitamins and minerals are an important part of a healthy diet and should not be avoided entirely. However, it is commonly recommended that you limit your intake of potassium,  phosphorus, and sodium. It may also be necessary to restrict other nutrients, such as carbohydrates or fat, if you have other health conditions. Your health care provider or dietitian can help you to determine the amount of these nutrients that is right for you. What is my plan? Your dietitian will help you to design a meal plan that is specific to your needs. Generally, meal plans include:  Grains, 6-11 servings per day. One serving is equal to 1 slice of bread or  cup of cooked rice or pasta.  Low-potassium vegetables, 2-3 servings per day. One serving is equal to  cup.  Low-potassium fruits, 2-3 servings per day. One serving is equal to  cup.  Meats and other protein sources, 8-11 oz per day.  Dairy,  cup per day.  Your dietitian will provide you with specific instructions about the amount of fluids you can have each day. What do I need to know about a dialysis diet?  Limit your intake of potassium. Potassium is found in milk, fruits, and vegetables.  Limit your intake of phosphorus. Phosphorus is found in milk, cheese, beans, nuts, and carbonated beverages. Avoid whole-grain and high-fiber foods because they contain high amounts of phosphorus.  Limit your intake of sodium. Foods that are high in sodium include processed and cured meats, ready-made frozen meals, canned vegetables, and salty snack foods. Do not use salt substitutes because they contain potassium.  If you were instructed  to restrict your fluid intake, follow your health care provider's specific instructions. You may be told to: ? Write down what you drink and any foods you eat that are made mostly from water, such as gelatin and soups. ? Drink from small cups to help control how much you drink.  Ask your health care provider if you should regularly take an over-the-counter medicine that binds phosphorus, such as antacid products that contain calcium carbonate.  Take vitamin and mineral supplements only as directed  by your health care provider.  Eat high-quality proteins, such as meat, poultry, fish, and eggs. Limit low-quality plant-based proteins, such as nuts and beans.  Cut potatoes into small pieces and boil them in unsalted water before you eat them. This can help to remove some potassium from the potato.  Drain all fluid from cooked vegetables and canned fruits before eating them. What foods can I eat? Grains White bread. White rice. Cooked cereal. Unsalted popcorn. Tortillas. Pasta. Vegetables Fresh or frozen broccoli, carrots, and green beans. Cabbage. Cauliflower. Celery. Cucumbers. Eggplant. Radishes. Zucchini. Fruits Apples. Fresh or frozen berries. Fresh or canned pears, peaches, and pineapple. Grapes. Plums. Meats and Other Protein Sources Fresh or frozen beef, pork, chicken, and fish. Eggs. Low-sodium canned tuna or salmon. Dairy Cream cheese. Heavy cream. Ricotta cheese. Beverages Apple cider. Cranberry juice. Grape juice. Lemonade. Black coffee. Condiments Herbs. Spices. Jam and jelly. Honey. Sweets and Desserts Sherbet. Cakes. Cookies. Fats and Oils Olive oil, canola oil, and safflower oil. Other Non-dairy creamer. Non-dairy whipped topping. Homemade broth without salt. The items listed above may not be a complete list of recommended foods or beverages. Contact your dietitian for more options. What foods are not recommended? Grains Whole-grain bread. Whole-grain pasta. High-fiber cereal. Vegetables Potatoes. Beets. Tomatoes. Winter squash and pumpkin. Asparagus. Spinach. Parsnips. Fruits Star fruit. Bananas. Oranges. Kiwi. Nectarines. Prunes. Melon. Dried fruit. Avocado. Meats and Other Protein Sources Canned, smoked, and cured meats. Soil scientist. Sardines. Nuts and seeds. Peanut butter. Beans and legumes. Dairy Milk. Buttermilk. Yogurt. Cheese and cottage cheese. Processed cheese spreads. Beverages Orange juice. Prune juice. Carbonated soft  drinks. Condiments Salt. Salt substitutes. Soy sauce. Sweets and Desserts Ice cream. Chocolate. Candied nuts. Fats and Oils Butter. Margarine. Other Ready-made frozen meals. Canned soups. The items listed above may not be a complete list of foods and beverages to avoid. Contact your dietitian for more information. This information is not intended to replace advice given to you by your health care provider. Make sure you discuss any questions you have with your health care provider. Document Released: 04/04/2004 Document Revised: 11/16/2015 Document Reviewed: 02/08/2014 Elsevier Interactive Patient Education  [employer].

## 2018-06-05 NOTE — Assessment & Plan Note (Signed)
Patient is doing well today and tolerating her medications without problem.  Her pressure is good today, so I will not make any additional changes to her regimen.  Because she will be followed closely by nephrology in the future, I have advised her to let them continue to make any changes in her blood pressure medications as they see fit.  She knows she can call should she have any concerns.

## 2018-06-09 NOTE — Progress Notes (Signed)
[facility] Note  06/10/2018     CHIEF COMPLAINT Patient presents for Retina Follow Up   HISTORY OF PRESENT ILLNESS: [name] is a 67 y.o. female who presents to the clinic today for:   HPI    Retina Follow Up    Patient presents with  Diabetic Retinopathy.  In both eyes.  This started weeks ago.  Severity is moderate.  Duration of weeks.  Since onset it is gradually worsening.  I, the attending physician,  performed the HPI with the patient and updated documentation appropriately.          Comments    67 y/o female pt here for 4 wk f/u for NPDR OU.  VA had been good OU for a while after last visit, but has since been gradually getting worse.  Denies pain, flashes, floaters, but eyes water a lot.  Notes irritation in the nasal corner of her left eye.  Systane prn OU.  BS 86 this a.m. A1C  7.3 6 mos ago.  Just had blood work done, but not sure of the new #.       Last edited by [name], MD on 06/10/2018 11:10 AM. (History)      Referring physician: [name], MD [address] [place], [place] [address]  HISTORICAL INFORMATION:   Selected notes from the MEDICAL RECORD NUMBER Referred by Dr. [name] for concern of BRVO OS LEE: 10.03.19 (C. Weaver) [BCVA: OD: 20/30 OS: 20/50] Ocular Hx-cataracts OU, HTR OU, NPDR OU, DES PMH-DM (X5T: 7.3, takes Trulicity and [place]), HTN, HLD     CURRENT MEDICATIONS: No current outpatient medications on file. (Ophthalmic Drugs)   No current facility-administered medications for this visit.  (Ophthalmic Drugs)   Current Outpatient Medications (Other)  Medication Sig  . amLODipine (NORVASC) 10 MG tablet TAKE ONE TABLET BY MOUTH ONCE DAILY (Patient taking differently: Take 5 mg by mouth daily. )  . Blood Glucose Monitoring Suppl (ONE TOUCH ULTRA 2) w/Device KIT Use to check BS BID-QID Dx:E11.9  . BREO ELLIPTA 100-25 MCG/INH AEPB INHALE 1 PUFF BY MOUTH ONCE DAILY (Patient taking  differently: Inhale 1 puff into the lungs daily. )  . calcitRIOL (ROCALTROL) 0.25 MCG capsule Take 0.25 mcg by mouth daily.  . carvedilol (COREG) 3.125 MG tablet Take 6.25 mg by mouth 2 (two) times daily.  . carvedilol (COREG) 6.25 MG tablet Take 1 tablet (6.25 mg total) by mouth 2 (two) times daily.  Marland Kitchen CINNAMON PO Take 350 mg by mouth daily.   . Dulaglutide (TRULICITY) 7.00 FV/4.9SW SOPN Inject 0.75 mg into the skin once a week. (Patient taking differently: Inject 0.75 mg into the skin every Wednesday. )  . furosemide (LASIX) 20 MG tablet TAKE 1 TABLET BY MOUTH ONCE DAILY  . gabapentin (NEURONTIN) 300 MG capsule TAKE 1 CAPSULE BY MOUTH THREE TIMES DAILY (Patient taking differently: Take 300 mg by mouth 3 (three) times daily as needed (for pain/neuropathy in feet/hands.). )  . glucose blood (ONE TOUCH ULTRA TEST) test strip Use as instructed  . Lancets (ONETOUCH ULTRASOFT) lancets Use as instructed  . oxyCODONE-acetaminophen (PERCOCET) 5-325 MG tablet Take 1 tablet by mouth every 6 (six) hours as needed for severe pain. (Patient not taking: Reported on 06/01/2018)  . polyethylene glycol (MIRALAX / GLYCOLAX) packet Take 17 g by mouth daily as needed for mild constipation.  Marland Kitchen PROAIR HFA 108 (90 Base) MCG/ACT inhaler INHALE TWO PUFFS INTO LUNGS  EVERY 6 HOURS AS NEEDED FOR WHEEZING AND FOR SHORTNESS OF BREATH (Patient taking differently: Inhale 2 puffs into the lungs every 6 (six) hours as needed for wheezing or shortness of breath. )  . rosuvastatin (CRESTOR) 20 MG tablet Take 1 tablet (20 mg total) by mouth daily. (Patient taking differently: Take 20 mg by mouth every evening. )  . sodium bicarbonate 650 MG tablet Take 650 mg by mouth 2 (two) times daily.  . TRADJENTA 5 MG TABS tablet TAKE 1 TABLET BY MOUTH ONCE DAILY (Patient taking differently: Take 5 mg by mouth daily. )  . TRESIBA FLEXTOUCH 100 UNIT/ML SOPN FlexTouch Pen INJECT 150 UNITS SUBCUTANEOUSLY ONCE DAILY  . VITAMIN A PO Take 2,400 Units  by mouth daily.   Current Facility-Administered Medications (Other)  Medication Route  . Bevacizumab (AVASTIN) SOLN 1.25 mg Intravitreal  . Bevacizumab (AVASTIN) SOLN 1.25 mg Intravitreal      REVIEW OF SYSTEMS: ROS    Positive for: Genitourinary, Endocrine, Eyes   Negative for: Constitutional, Gastrointestinal, Neurological, Skin, Musculoskeletal, HENT, Cardiovascular, Respiratory, Psychiatric, Allergic/Imm, Heme/Lymph   Last edited by Matthew Folks, COA on 06/10/2018  9:42 AM. (History)       ALLERGIES Allergies  Allergen Reactions  . Penicillins Other (See Comments)    UNSPECIFIED CHILDHOOD REACTION  Has patient had a PCN reaction causing immediate rash, facial/tongue/throat swelling, SOB or lightheadedness with hypotension: Unknown Has patient had a PCN reaction causing severe rash involving mucus membranes or skin necrosis: Unknown Has patient had a PCN reaction that required hospitalization: Unknown Has patient had a PCN reaction occurring within the last 10 years: Unknown If all of the above answers are "NO", then may proceed with Cephalosporin use.   . Adhesive [Tape] Rash    PAST MEDICAL HISTORY Past Medical History:  Diagnosis Date  . Anemia   . Arthritis   . Asthma   . CKD stage 3 due to type 2 diabetes mellitus ([place])   . Depression   . Diabetic retinopathy of both eyes ([place])   . GERD (gastroesophageal reflux disease)    pt denies  . HLD (hyperlipidemia)   . Hypertension   . Microalbuminuria due to type 2 diabetes mellitus ([place])   . Neuromuscular disorder ([place])    diabetic neuropathy  . Nonproliferative retinopathy due to secondary diabetes ([place])   . Pneumonia    hx of   . Renal cell cancer ([place])   . 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 BASCILIC VEIN TRANSPOSITION Left 08/08/2017   Procedure: BASILIC VEIN TRANSPOSITION FIRST STAGE LEFT ARM;  Surgeon: Serafina Mitchell, MD;  Location: [facility];  Service: Vascular;  Laterality: Left;  . BASCILIC VEIN TRANSPOSITION Left 05/14/2018   Procedure: SECOND STAGE BASILIC VEIN TRANSPOSITION LEFT ARM;  Surgeon: Serafina Mitchell, MD;  Location: [facility];  Service: Vascular;  Laterality: Left;  . ROBOTIC ADRENALECTOMY Left 03/10/2017   Procedure: XI ROBOTIC ADRENALECTOMY;  Surgeon: Nickie Retort, MD;  Location: [facility];  Service: Urology;  Laterality: Left;  . ROBOTIC ASSITED PARTIAL NEPHRECTOMY Right 03/10/2017   Procedure: XI ROBOTIC ASSITED RADICAL NEPHRECTOMY;  Surgeon: Nickie Retort, MD;  Location: [facility];  Service: Urology;  Laterality: Right;  . TOE AMPUTATION Bilateral    both great toe ,, left foot 2nd toe 1/2    FAMILY HISTORY Family History  Problem Relation Age of Onset  . Heart disease Mother   .  Diabetes Sister     SOCIAL HISTORY Social History   Tobacco Use  . Smoking status: Never Smoker  . Smokeless tobacco: Never Used  Substance Use Topics  . Alcohol use: Yes    Alcohol/week: 0.0 standard drinks    Comment: occ  . Drug use: No         OPHTHALMIC EXAM:  Base Eye Exam    Visual Acuity (Snellen - Linear)      Right Left   Dist cc 20/30 20/150 -2   Dist ph cc 20/25 - 20/50       Tonometry (Tonopen, 9:46 AM)      Right Left   Pressure 14 15       Pupils      Dark Light Shape React APD   Right 3 2 Round Brisk None   Left 3 2 Round Brisk None       Visual Fields (Counting fingers)      Left Right    Full Full       Extraocular Movement      Right Left    Full, Ortho Full, Ortho       Neuro/Psych    Oriented x3:  Yes   Mood/Affect:  Normal       Dilation    Both eyes:  1.0% Mydriacyl, 2.5% Phenylephrine @ 9:46 AM        Slit Lamp and Fundus Exam    Slit Lamp Exam      Right Left   Lids/Lashes Dermatochalasis - upper lid, mild MGD Dermatochalasis - upper lid, Telangiectasia, mild MGD   Conjunctiva/Sclera White and quiet White and quiet   Cornea 1+  Punctate epithelial erosions, mild Arcus 1+ Punctate epithelial erosions   Anterior Chamber Deep and quiet Deep and quiet   Iris Round and dilated, No NVI Round and well dilated   Lens 2+ Nuclear sclerosis, 2-3+ Cortical cataract 2-3+ Nuclear sclerosis, early brunescense, 2-3+ Cortical cataract, 1-2+ Posterior subcapsular cataract   Vitreous Vitreous syneresis Vitreous syneresis       Fundus Exam      Right Left   Disc Pink and Sharp, punctate disc heme at 1100 Pink and Sharp   C/D Ratio 0.3 0.25   Macula Blunted foveal reflex, scattered MA and IRH, cotton wool spot IN macula w/surrounding blot heme. Blunted foveal reflex, scattered DBH, small cluster of DBH and CWS IN macula - mildly improved.   Vessels Vascular attenuation, Tortuous, AV crossing changes Tortuous, Vascular attenuation, AV crossing changes   Periphery Attached, scattered DBH posteriorly, small CWS nasal to disc Attached, scattered MA/DBH          IMAGING AND PROCEDURES  Imaging and Procedures for '@TODAY'$ @  OCT, Retina - OU - Both Eyes       Right Eye Quality was good. Central Foveal Thickness: 250. Progression has improved. Findings include normal foveal contour, no SRF (Interval improvement in IRF, inner-retinal thickening along proximal IT arcades.).   Left Eye Quality was good. Central Foveal Thickness: 262. Progression has improved. Findings include normal foveal contour, intraretinal fluid, no SRF, vitreomacular adhesion  (Peristent IRF).   Notes *Images captured and stored on drive  Diagnosis / Impression:  NFP; +IRF; no SRF OU noncentral DME OU (OS > OD) OD - Interval improvement in IRF, inner-retinal thickening along proximal IT arcades. OS - Persistent IRF  Clinical management:  See below  Abbreviations: NFP - Normal foveal profile. CME - cystoid macular edema. PED - pigment epithelial detachment.  IRF - intraretinal fluid. SRF - subretinal fluid. EZ - ellipsoid zone. ERM - epiretinal membrane. ORA  - outer retinal atrophy. ORT - outer retinal tubulation. SRHM - subretinal hyper-reflective material        Intravitreal Injection, Pharmacologic Agent - OS - Left Eye       Time Out 06/10/2018. 10:15 AM. Confirmed correct patient, procedure, site, and patient consented.   Anesthesia Topical anesthesia was used. Anesthetic medications included Lidocaine 2%, Proparacaine 0.5%.   Procedure Preparation included 5% betadine to ocular surface, eyelid speculum. A supplied needle was used.   Injection:  1.25 mg Bevacizumab 1.'25mg'$ /0.58m   NDC: 50242-060-01, Lot: [id number]'@13'$ , Expiration date: 08/20/2018   Route: Intravitreal, Site: Left Eye, Waste: 0 mL  Post-op Post injection exam found visual acuity of at least counting fingers. The patient tolerated the procedure well. There were no complications. The patient received written and verbal post procedure care education.                 ASSESSMENT/PLAN:    ICD-10-CM   1. Moderate nonproliferative diabetic retinopathy of both eyes with macular edema associated with type 2 diabetes mellitus (HCC) E2/12/2020Intravitreal Injection, Pharmacologic Agent - OS - Left Eye    Bevacizumab (AVASTIN) SOLN 1.25 mg  2. Retinal edema H35.81 OCT, Retina - OU - Both Eyes  3. Essential hypertension I10   4. Hypertensive retinopathy of both eyes H35.033   5. Combined forms of age-related cataract of both eyes H25.813     1,2 Moderate non-proliferative diabetic retinopathy w/ DME OU (OS > OD) - The incidence, risk factors for progression, natural history and treatment options for diabetic retinopathy  were discussed with patient.   - The need for close monitoring of blood glucose, blood pressure, and serum lipids, avoiding cigarette or any type of tobacco, and the need for long term follow up was also discussed with patient. - Initial exam shows scattered MA, BCVA 20/50 OS - Initial FA (05/12/18) shows leaking MA, no NV OS - Initial OCT showed  diabetic macular edema OU (OS > OD)  - s/p IVA #1 OS (10.22.19) - Today BCVA stable at 20/50 OS.  OCT w/ persisent IRF OS. - recommend IVA #2 OS today, 11.20.19, but discussed visual improvement may be limited by visually significant cataract OS - pt wishes to proceed - RBA of procedure discussed, questions answered - informed consent obtained and signed - see procedure note - f/u in 4 wks  3,4. Hypertensive retinopathy OU - discussed importance of tight BP control - monitor  5. Combined Form Cataracts OU (OS > OD) - The symptoms of cataract, surgical options, and treatments and risks were discussed with patient. - discussed diagnosis and progression - cataract OS likely visually significant, OD borderline - under the expert management of Dr. [id number]- clear from a retina standpoint to proceed with cataract surgery when pt and surgeon are ready - recommend coordinating care so that pt receives anti-VEGF 1-2 wks prior to cataract surgery    Ophthalmic Meds Ordered this visit:  Meds ordered this encounter  Medications  . Bevacizumab (AVASTIN) SOLN 1.25 mg       Return in about 4 weeks (around 07/08/2018) for 4 wk f/u for NPDR OU.  There are no Patient Instructions on file for this visit.   Explained the diagnoses, plan, and follow up with the patient and they expressed understanding.  Patient expressed understanding of the importance of proper follow up care.   This document serves  as a record of services personally performed by Gardiner Sleeper, MD, PhD. It was created on their behalf by Ernest Mallick, OA, an ophthalmic assistant. The creation of this record is the provider's dictation and/or activities during the visit.    Electronically signed by: Ernest Mallick, OA  11.19.19 12:56 AM   Gardiner Sleeper, M.D., Ph.D. Diseases & Surgery of the Retina and Vitreous [facility]    I have reviewed the above documentation for accuracy and completeness, and I  agree with the above. Gardiner Sleeper, M.D., Ph.D. 06/13/18 12:59 AM     Abbreviations: M myopia (nearsighted); A astigmatism; H hyperopia (farsighted); P presbyopia; Mrx spectacle prescription;  CTL contact lenses; OD right eye; OS left eye; OU both eyes  XT exotropia; ET esotropia; PEK punctate epithelial keratitis; PEE punctate epithelial erosions; DES dry eye syndrome; MGD meibomian gland dysfunction; ATs artificial tears; PFAT's preservative free artificial tears; [place] nuclear sclerotic cataract; PSC posterior subcapsular cataract; ERM epi-retinal membrane; PVD posterior vitreous detachment; RD retinal detachment; DM diabetes mellitus; DR diabetic retinopathy; NPDR non-proliferative diabetic retinopathy; PDR proliferative diabetic retinopathy; CSME clinically significant macular edema; DME diabetic macular edema; dbh dot blot hemorrhages; CWS cotton wool spot; POAG primary open angle glaucoma; C/D cup-to-disc ratio; HVF humphrey visual field; GVF goldmann visual field; OCT optical coherence tomography; IOP intraocular pressure; BRVO Branch retinal vein occlusion; CRVO central retinal vein occlusion; CRAO central retinal artery occlusion; BRAO branch retinal artery occlusion; RT retinal tear; SB scleral buckle; PPV pars plana vitrectomy; VH Vitreous hemorrhage; PRP panretinal laser photocoagulation; IVK intravitreal kenalog; VMT vitreomacular traction; MH Macular hole;  NVD neovascularization of the disc; NVE neovascularization elsewhere; AREDS age related eye disease study; ARMD age related macular degeneration; POAG primary open angle glaucoma; EBMD epithelial/anterior basement membrane dystrophy; ACIOL anterior chamber intraocular lens; IOL intraocular lens; PCIOL posterior chamber intraocular lens; Phaco/IOL phacoemulsification with intraocular lens placement; [place] photorefractive keratectomy; LASIK laser assisted in situ keratomileusis; HTN hypertension; DM diabetes mellitus; COPD chronic obstructive  pulmonary disease

## 2018-06-10 ENCOUNTER — Ambulatory Visit (INDEPENDENT_AMBULATORY_CARE_PROVIDER_SITE_OTHER): Payer: Medicare Other | Admitting: Ophthalmology

## 2018-06-10 ENCOUNTER — Encounter (INDEPENDENT_AMBULATORY_CARE_PROVIDER_SITE_OTHER): Payer: Self-pay | Admitting: Ophthalmology

## 2018-06-10 DIAGNOSIS — H35033 Hypertensive retinopathy, bilateral: Secondary | ICD-10-CM

## 2018-06-10 DIAGNOSIS — E113313 Type 2 diabetes mellitus with moderate nonproliferative diabetic retinopathy with macular edema, bilateral: Secondary | ICD-10-CM

## 2018-06-10 DIAGNOSIS — H3581 Retinal edema: Secondary | ICD-10-CM

## 2018-06-10 DIAGNOSIS — I10 Essential (primary) hypertension: Secondary | ICD-10-CM

## 2018-06-10 DIAGNOSIS — H25813 Combined forms of age-related cataract, bilateral: Secondary | ICD-10-CM

## 2018-06-13 ENCOUNTER — Encounter (INDEPENDENT_AMBULATORY_CARE_PROVIDER_SITE_OTHER): Payer: Self-pay | Admitting: Ophthalmology

## 2018-06-13 DIAGNOSIS — H3581 Retinal edema: Secondary | ICD-10-CM | POA: Diagnosis not present

## 2018-06-13 DIAGNOSIS — E113313 Type 2 diabetes mellitus with moderate nonproliferative diabetic retinopathy with macular edema, bilateral: Secondary | ICD-10-CM | POA: Diagnosis not present

## 2018-06-13 MED ORDER — BEVACIZUMAB CHEMO INJECTION 1.25MG/0.05ML SYRINGE FOR KALEIDOSCOPE
1.2500 mg | INTRAVITREAL | Status: DC
  Administered 2018-06-13: 1.25 mg via INTRAVITREAL

## 2018-07-07 ENCOUNTER — Ambulatory Visit: Payer: Medicare Other | Admitting: Cardiovascular Disease

## 2018-07-08 ENCOUNTER — Encounter: Payer: Self-pay | Admitting: Cardiovascular Disease

## 2018-07-08 ENCOUNTER — Ambulatory Visit (INDEPENDENT_AMBULATORY_CARE_PROVIDER_SITE_OTHER): Payer: Medicare Other | Admitting: Cardiovascular Disease

## 2018-07-08 ENCOUNTER — Encounter (INDEPENDENT_AMBULATORY_CARE_PROVIDER_SITE_OTHER): Payer: Self-pay

## 2018-07-08 ENCOUNTER — Encounter (INDEPENDENT_AMBULATORY_CARE_PROVIDER_SITE_OTHER): Payer: Medicare Other | Admitting: Ophthalmology

## 2018-07-08 VITALS — BP 132/64 | HR 74 | Ht 67.0 in | Wt 246.0 lb

## 2018-07-08 DIAGNOSIS — I2510 Atherosclerotic heart disease of native coronary artery without angina pectoris: Secondary | ICD-10-CM

## 2018-07-08 DIAGNOSIS — I10 Essential (primary) hypertension: Secondary | ICD-10-CM | POA: Diagnosis not present

## 2018-07-08 DIAGNOSIS — R002 Palpitations: Secondary | ICD-10-CM

## 2018-07-08 DIAGNOSIS — R0989 Other specified symptoms and signs involving the circulatory and respiratory systems: Secondary | ICD-10-CM

## 2018-07-08 DIAGNOSIS — E7800 Pure hypercholesterolemia, unspecified: Secondary | ICD-10-CM | POA: Diagnosis not present

## 2018-07-08 NOTE — Progress Notes (Signed)
07/08/2018 [name]   Jan 18, 1951  [id number]  Primary Physician Pickard, Cammie Mcgee, MD Primary Cardiologist: Lorretta Harp MD [name], [place]  HPI:  [name] is a 67 y.o.  moderately overweight single Caucasian female mother of 2, grandmother of 3 grandchildren company by her Sister [name] today.  She was referred by Dr. Dennard Schaumann for cardiovascular evaluation because of coronary calcification seen on CT scan performed 10/31/2017.    I last saw her in the office 02/17/2018.  She does have history of treated diabetes, hypertension and hyperlipidemia.  She does have a strong family history with 2 brothers and her father all of whom had ischemic heart disease.  He is never had a heart attack or stroke.  He does complain of some back pain on exertion with dyspnea.  She had a nephrectomy in the past currently has CKD 4 with a dialysis fistula placed by Dr. Trula Slade recently in anticipation of hemodialysis.  She recently had an AV fistula placed by Dr. Trula Slade successfully without complication.  She does have reactive airways disease and does complain of some shortness of breath.  He does not smoke.   Current Meds  Medication Sig  . amLODipine (NORVASC) 10 MG tablet TAKE ONE TABLET BY MOUTH ONCE DAILY (Patient taking differently: Take 5 mg by mouth daily. )  . Blood Glucose Monitoring Suppl (ONE TOUCH ULTRA 2) w/Device KIT Use to check BS BID-QID Dx:E11.9  . BREO ELLIPTA 100-25 MCG/INH AEPB INHALE 1 PUFF BY MOUTH ONCE DAILY (Patient taking differently: Inhale 1 puff into the lungs daily. )  . calcitRIOL (ROCALTROL) 0.25 MCG capsule Take 0.25 mcg by mouth daily.  . carvedilol (COREG) 3.125 MG tablet Take 6.25 mg by mouth 2 (two) times daily.  . carvedilol (COREG) 6.25 MG tablet Take 1 tablet (6.25 mg total) by mouth 2 (two) times daily.  Marland Kitchen CINNAMON PO Take 350 mg by mouth daily.   . Dulaglutide (TRULICITY) 3.41 PF/7.9KW SOPN Inject 0.75 mg into the skin once a week. (Patient taking  differently: Inject 0.75 mg into the skin every Wednesday. )  . furosemide (LASIX) 20 MG tablet TAKE 1 TABLET BY MOUTH ONCE DAILY  . gabapentin (NEURONTIN) 300 MG capsule TAKE 1 CAPSULE BY MOUTH THREE TIMES DAILY (Patient taking differently: Take 300 mg by mouth 3 (three) times daily as needed (for pain/neuropathy in feet/hands.). )  . glucose blood (ONE TOUCH ULTRA TEST) test strip Use as instructed  . Lancets (ONETOUCH ULTRASOFT) lancets Use as instructed  . oxyCODONE-acetaminophen (PERCOCET) 5-325 MG tablet Take 1 tablet by mouth every 6 (six) hours as needed for severe pain.  . polyethylene glycol (MIRALAX / GLYCOLAX) packet Take 17 g by mouth daily as needed for mild constipation.  Marland Kitchen PROAIR HFA 108 (90 Base) MCG/ACT inhaler INHALE TWO PUFFS INTO LUNGS EVERY 6 HOURS AS NEEDED FOR WHEEZING AND FOR SHORTNESS OF BREATH (Patient taking differently: Inhale 2 puffs into the lungs every 6 (six) hours as needed for wheezing or shortness of breath. )  . rosuvastatin (CRESTOR) 20 MG tablet Take 1 tablet (20 mg total) by mouth daily. (Patient taking differently: Take 20 mg by mouth every evening. )  . sodium bicarbonate 650 MG tablet Take 650 mg by mouth 2 (two) times daily.  . TRADJENTA 5 MG TABS tablet TAKE 1 TABLET BY MOUTH ONCE DAILY (Patient taking differently: Take 5 mg by mouth daily. )  . TRESIBA FLEXTOUCH 100 UNIT/ML SOPN FlexTouch Pen INJECT 150  UNITS SUBCUTANEOUSLY ONCE DAILY  . VITAMIN A PO Take 2,400 Units by mouth daily.   Current Facility-Administered Medications for the 07/08/18 encounter (Office Visit) with Lorretta Harp, MD  Medication  . Bevacizumab (AVASTIN) SOLN 1.25 mg  . Bevacizumab (AVASTIN) SOLN 1.25 mg     Allergies  Allergen Reactions  . Penicillins Other (See Comments)    UNSPECIFIED CHILDHOOD REACTION  Has patient had a PCN reaction causing immediate rash, facial/tongue/throat swelling, SOB or lightheadedness with hypotension: Unknown Has patient had a PCN reaction  causing severe rash involving mucus membranes or skin necrosis: Unknown Has patient had a PCN reaction that required hospitalization: Unknown Has patient had a PCN reaction occurring within the last 10 years: Unknown If all of the above answers are "NO", then may proceed with Cephalosporin use.   . Adhesive [Tape] Rash    Social History   Socioeconomic History  . Marital status: Single    Spouse name: Not on file  . Number of children: Not on file  . Years of education: Not on file  . Highest education level: Not on file  Occupational History  . Not on file  Social Needs  . Financial resource strain: Not on file  . Food insecurity:    Worry: Not on file    Inability: Not on file  . Transportation needs:    Medical: Not on file    Non-medical: Not on file  Tobacco Use  . Smoking status: Never Smoker  . Smokeless tobacco: Never Used  Substance and Sexual Activity  . Alcohol use: Yes    Alcohol/week: 0.0 standard drinks    Comment: occ  . Drug use: No  . Sexual activity: Not Currently    Partners: Male  Lifestyle  . Physical activity:    Days per week: Not on file    Minutes per session: Not on file  . Stress: Not on file  Relationships  . Social connections:    Talks on phone: Not on file    Gets together: Not on file    Attends religious service: Not on file    Active member of club or organization: Not on file    Attends meetings of clubs or organizations: Not on file    Relationship status: Not on file  . Intimate partner violence:    Fear of current or ex partner: Not on file    Emotionally abused: Not on file    Physically abused: Not on file    Forced sexual activity: Not on file  Other Topics Concern  . Not on file  Social History Narrative  . Not on file     Review of Systems: General: negative for chills, fever, night sweats or weight changes.  Cardiovascular: negative for chest pain, dyspnea on exertion, edema, orthopnea, palpitations, paroxysmal  nocturnal dyspnea or shortness of breath Dermatological: negative for rash Respiratory: negative for cough or wheezing Urologic: negative for hematuria Abdominal: negative for nausea, vomiting, diarrhea, bright red blood per rectum, melena, or hematemesis Neurologic: negative for visual changes, syncope, or dizziness All other systems reviewed and are otherwise negative except as noted above.    Blood pressure 132/64, pulse 74, height _0  (1.702 m), weight 246 lb (111.6 kg).  General appearance: alert and no distress Neck: no adenopathy, no JVD, supple, symmetrical, trachea midline, thyroid not enlarged, symmetric, no tenderness/mass/nodules and Right carotid bruit Lungs: clear to auscultation bilaterally Heart: regular rate and rhythm, S1, S2 normal, no murmur, click, rub or  gallop Extremities: extremities normal, atraumatic, no cyanosis or edema Pulses: 2+ and symmetric Skin: Skin color, texture, turgor normal. No rashes or lesions Neurologic: Alert and oriented X 3, normal strength and tone. Normal symmetric reflexes. Normal coordination and gait  EKG not performed today  ASSESSMENT AND PLAN:   Hyperlipidemia History of hyperlipidemia on statin therapy with lipid profile performed 04/02/2018 revealing total cholesterol 115, LDL 64 and HDL of 30.  Essential hypertension History of essential hypertension blood pressure measured today 132/64.  She is on amlodipine and carvedilol.  Continue current meds at current dosing.  Coronary artery calcification seen on CAT scan History of coronary calcification on chest CT with risk factors and a subsequent negative Myoview stress test.  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  She really has no symptoms of heart failure.      Lorretta Harp MD FACP,FACC,FAHA, [facility] 07/08/2018 12:28 PM

## 2018-07-08 NOTE — Patient Instructions (Addendum)
Medication Instructions:  .Your physician recommends that you continue on your current medications as directed. Please refer to the Current Medication list given to you today.  If you need a refill on your cardiac medications before your next appointment, please call your pharmacy.   Lab work: NONE If you have labs (blood work) drawn today and your tests are completely normal, you will receive your results only by: Marland Kitchen MyChart Message (if you have MyChart) OR . A paper copy in the mail If you have any lab test that is abnormal or we need to change your treatment, we will call you to review the results.  Testing/Procedures: Your physician has recommended that you wear a holter monitor (ZIO PATCH). Holter monitors are medical devices that record the heart's electrical activity. Doctors most often use these monitors to diagnose arrhythmias. Arrhythmias are problems with the speed or rhythm of the heartbeat. The monitor is a small, portable device. You can wear one while you do your normal daily activities. This is usually used to diagnose what is causing palpitations/syncope (passing out).  Your physician has requested that you have a carotid duplex. This test is an ultrasound of the carotid arteries in your neck. It looks at blood flow through these arteries that supply the brain with blood. Allow one hour for this exam. There are no restrictions or special instructions.     Follow-Up: At [facility], you and your health needs are our priority.  As part of our continuing mission to provide you with exceptional heart care, we have created designated Provider Care Teams.  These Care Teams include your primary Cardiologist (physician) and Advanced Practice Providers (APPs -  Physician Assistants and Nurse Practitioners) who all work together to provide you with the care you need, when you need it. You will need a follow up appointment in 12 months.  Please call our office 2 months in advance to  schedule this appointment.  You may see DR. BERRY or one of the following Advanced Practice Providers on your designated Care Team:   Kerin Ransom, PA-C [place], [place] . Sande Rives, PA-C

## 2018-07-08 NOTE — Assessment & Plan Note (Signed)
History of hyperlipidemia on statin therapy with lipid profile performed 04/02/2018 revealing total cholesterol 115, LDL 64 and HDL of 30.

## 2018-07-08 NOTE — Assessment & Plan Note (Signed)
History of coronary calcification on chest CT with risk factors and a subsequent negative Myoview stress test.

## 2018-07-08 NOTE — Assessment & Plan Note (Signed)
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  She really has no symptoms of heart failure.

## 2018-07-08 NOTE — Progress Notes (Signed)
[facility] Note  07/10/2018     CHIEF COMPLAINT Patient presents for Retina Follow Up   HISTORY OF PRESENT ILLNESS: [name] is a 67 y.o. female who presents to the clinic today for:   HPI    Retina Follow Up    Patient presents with  Diabetic Retinopathy.  In both eyes.  This started months ago.  Severity is moderate.  Duration of months.  Since onset it is stable.  I, the attending physician,  performed the HPI with the patient and updated documentation appropriately.          Comments    67 y/o female pt here for 4 wk f/u for NPDR w/mac edema OU.  No change in [name] OU.  Denies pain, flashes, floaters.  OS has a little bit of FBS today.  AT prn OU.  BS was 90 this morning.  A1C unknown.       Last edited by [name], MD on 07/10/2018  1:06 PM. (History)    pt states she is not sure when her next appt with [name] is, she states she has kidney and heart problems that she is dealing with right now  Referring physician: [name], MD [place],  [id number]  HISTORICAL INFORMATION:   Selected notes from the MEDICAL RECORD NUMBER Referred by Dr. [name] for concern of BRVO OS LEE: 10.03.19 (C. Weaver) [BCVA: OD: 20/30 OS: 20/50] Ocular Hx-cataracts OU, HTR OU, NPDR OU, DES PMH-DM (V0J: 7.3, takes Trulicity and [place]), HTN, HLD     CURRENT MEDICATIONS: No current outpatient medications on file. (Ophthalmic Drugs)   No current facility-administered medications for this visit.  (Ophthalmic Drugs)   Current Outpatient Medications (Other)  Medication Sig  . amLODipine (NORVASC) 10 MG tablet TAKE ONE TABLET BY MOUTH ONCE DAILY (Patient taking differently: Take 5 mg by mouth daily. )  . Blood Glucose Monitoring Suppl (ONE TOUCH ULTRA 2) w/Device KIT Use to check BS BID-QID Dx:E11.9  . BREO ELLIPTA 100-25 MCG/INH AEPB INHALE 1 PUFF BY MOUTH ONCE DAILY (Patient taking differently: Inhale 1 puff into  the lungs daily. )  . calcitRIOL (ROCALTROL) 0.25 MCG capsule Take 0.25 mcg by mouth daily.  . carvedilol (COREG) 3.125 MG tablet Take 6.25 mg by mouth 2 (two) times daily.  . carvedilol (COREG) 6.25 MG tablet Take 1 tablet (6.25 mg total) by mouth 2 (two) times daily.  Marland Kitchen CINNAMON PO Take 350 mg by mouth daily.   . Dulaglutide (TRULICITY) 5.00 XF/8.1WE SOPN Inject 0.75 mg into the skin once a week. (Patient taking differently: Inject 0.75 mg into the skin every Wednesday. )  . furosemide (LASIX) 20 MG tablet TAKE 1 TABLET BY MOUTH ONCE DAILY  . gabapentin (NEURONTIN) 300 MG capsule TAKE 1 CAPSULE BY MOUTH THREE TIMES DAILY (Patient taking differently: Take 300 mg by mouth 3 (three) times daily as needed (for pain/neuropathy in feet/hands.). )  . glucose blood (ONE TOUCH ULTRA TEST) test strip Use as instructed  . Lancets (ONETOUCH ULTRASOFT) lancets Use as instructed  . oxyCODONE-acetaminophen (PERCOCET) 5-325 MG tablet Take 1 tablet by mouth every 6 (six) hours as needed for severe pain.  . polyethylene glycol (MIRALAX / GLYCOLAX) packet Take 17 g by mouth daily as needed for mild constipation.  Marland Kitchen PROAIR HFA 108 (90 Base) MCG/ACT inhaler INHALE TWO PUFFS INTO LUNGS EVERY 6 HOURS AS NEEDED FOR WHEEZING AND FOR SHORTNESS OF BREATH (Patient  taking differently: Inhale 2 puffs into the lungs every 6 (six) hours as needed for wheezing or shortness of breath. )  . rosuvastatin (CRESTOR) 20 MG tablet Take 1 tablet (20 mg total) by mouth daily. (Patient taking differently: Take 20 mg by mouth every evening. )  . sodium bicarbonate 650 MG tablet Take 650 mg by mouth 2 (two) times daily.  . TRADJENTA 5 MG TABS tablet TAKE 1 TABLET BY MOUTH ONCE DAILY (Patient taking differently: Take 5 mg by mouth daily. )  . TRESIBA FLEXTOUCH 100 UNIT/ML SOPN FlexTouch Pen INJECT 150 UNITS SUBCUTANEOUSLY ONCE DAILY  . VITAMIN A PO Take 2,400 Units by mouth daily.   Current Facility-Administered Medications (Other)   Medication Route  . Bevacizumab (AVASTIN) SOLN 1.25 mg Intravitreal  . Bevacizumab (AVASTIN) SOLN 1.25 mg Intravitreal      REVIEW OF SYSTEMS: ROS    Positive for: Endocrine, Eyes   Negative for: Constitutional, Gastrointestinal, Neurological, Skin, Genitourinary, Musculoskeletal, HENT, Cardiovascular, Respiratory, Psychiatric, Allergic/Imm, Heme/Lymph   Last edited by Matthew Folks, COA on 07/10/2018 10:22 AM. (History)       ALLERGIES Allergies  Allergen Reactions  . Penicillins Other (See Comments)    UNSPECIFIED CHILDHOOD REACTION  Has patient had a PCN reaction causing immediate rash, facial/tongue/throat swelling, SOB or lightheadedness with hypotension: Unknown Has patient had a PCN reaction causing severe rash involving mucus membranes or skin necrosis: Unknown Has patient had a PCN reaction that required hospitalization: Unknown Has patient had a PCN reaction occurring within the last 10 years: Unknown If all of the above answers are "NO", then may proceed with Cephalosporin use.   . Adhesive [Tape] Rash    PAST MEDICAL HISTORY Past Medical History:  Diagnosis Date  . Anemia   . Arthritis   . Asthma   . CKD stage 3 due to type 2 diabetes mellitus ([place])   . Depression   . Diabetic retinopathy of both eyes ([place])   . GERD (gastroesophageal reflux disease)    pt denies  . HLD (hyperlipidemia)   . Hypertension   . Microalbuminuria due to type 2 diabetes mellitus ([place])   . Neuromuscular disorder ([place])    diabetic neuropathy  . Nonproliferative retinopathy due to secondary diabetes ([place])   . Pneumonia    hx of   . Renal cell cancer ([place])   . 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 BASCILIC VEIN TRANSPOSITION Left 08/08/2017   Procedure: BASILIC VEIN TRANSPOSITION FIRST STAGE LEFT ARM;  Surgeon: Serafina Mitchell, MD;  Location: [facility];  Service: Vascular;  Laterality: Left;  .  BASCILIC VEIN TRANSPOSITION Left 05/14/2018   Procedure: SECOND STAGE BASILIC VEIN TRANSPOSITION LEFT ARM;  Surgeon: Serafina Mitchell, MD;  Location: [facility];  Service: Vascular;  Laterality: Left;  . ROBOTIC ADRENALECTOMY Left 03/10/2017   Procedure: XI ROBOTIC ADRENALECTOMY;  Surgeon: Nickie Retort, MD;  Location: [facility];  Service: Urology;  Laterality: Left;  . ROBOTIC ASSITED PARTIAL NEPHRECTOMY Right 03/10/2017   Procedure: XI ROBOTIC ASSITED RADICAL NEPHRECTOMY;  Surgeon: Nickie Retort, MD;  Location: [facility];  Service: Urology;  Laterality: Right;  . TOE AMPUTATION Bilateral    both great toe ,, left foot 2nd toe 1/2    FAMILY HISTORY Family History  Problem Relation Age of Onset  . Heart disease Mother   . Diabetes Sister     SOCIAL HISTORY Social History  Tobacco Use  . Smoking status: Never Smoker  . Smokeless tobacco: Never Used  Substance Use Topics  . Alcohol use: Yes    Alcohol/week: 0.0 standard drinks    Comment: occ  . Drug use: No         OPHTHALMIC EXAM:  Base Eye Exam    Visual Acuity (Snellen - Linear)      Right Left   Dist [place] 20/20 -2 20/150 -2   Dist ph   20/50       Tonometry (Tonopen, 10:20 AM)      Right Left   Pressure 10 10       Pupils      Dark Light Shape React APD   Right 3 2 Round Brisk None   Left 3 2 Round Brisk None       Visual Fields (Counting fingers)      Left Right    Full Full       Extraocular Movement      Right Left    Full, Ortho Full, Ortho       Neuro/Psych    Oriented x3:  Yes   Mood/Affect:  Normal       Dilation    Both eyes:  1.0% Mydriacyl, 2.5% Phenylephrine @ 10:20 AM        Slit Lamp and Fundus Exam    Slit Lamp Exam      Right Left   Lids/Lashes Dermatochalasis - upper lid, mild MGD Dermatochalasis - upper lid, Telangiectasia, mild MGD   Conjunctiva/Sclera White and quiet White and quiet   Cornea 1+ Punctate epithelial erosions, mild Arcus 1+ Punctate epithelial erosions    Anterior Chamber Deep and quiet Deep and quiet   Iris Round and dilated, No NVI Round and well dilated   Lens 2+ Nuclear sclerosis, 2-3+ Cortical cataract 2-3+ Nuclear sclerosis, early brunescense, 2-3+ Cortical cataract, 1-2+ Posterior subcapsular cataract   Vitreous Vitreous syneresis, Posterior vitreous detachment Vitreous syneresis       Fundus Exam      Right Left   Disc Pink and Sharp Pink and Sharp   C/D Ratio 0.3 0.25   Macula Blunted foveal reflex, scattered MA and IRH, cotton wool spot IN macula w/surrounding blot heme. Blunted foveal reflex, scattered DBH, small cluster of DBH and CWS IN macula - mildly improved.   Vessels Vascular attenuation, Tortuous, AV crossing changes Tortuous, Vascular attenuation, AV crossing changes   Periphery Attached, scattered DBH and small CWS nasal to disc Attached, scattered MA/DBH          IMAGING AND PROCEDURES  Imaging and Procedures for _0 @  OCT, Retina - OU - Both Eyes       Right Eye Quality was good. Central Foveal Thickness: 252. Progression has been stable. Findings include normal foveal contour, no SRF, no IRF (Interval improvement in IRF, inner-retinal thickening along proximal IT arcades.).   Left Eye Quality was good. Central Foveal Thickness: 256. Progression has improved. Findings include normal foveal contour, intraretinal fluid, no SRF, vitreomacular adhesion  (Peristent IRF).   Notes *Images captured and stored on drive  Diagnosis / Impression:  NFP; +IRF; no SRF OU noncentral DME OU (OS > OD) OD - Interval improvement in IRF, inner-retinal thickening along proximal IT arcades. OS - Persistent IRF  Clinical management:  See below  Abbreviations: NFP - Normal foveal profile. CME - cystoid macular edema. PED - pigment epithelial detachment. IRF - intraretinal fluid. SRF - subretinal fluid. EZ - ellipsoid  zone. ERM - epiretinal membrane. ORA - outer retinal atrophy. ORT - outer retinal tubulation. SRHM -  subretinal hyper-reflective material        Intravitreal Injection, Pharmacologic Agent - OS - Left Eye       Time Out 07/10/2018. 11:43 AM. Confirmed correct patient, procedure, site, and patient consented.   Anesthesia Topical anesthesia was used. Anesthetic medications included Lidocaine 2%, Proparacaine 0.5%.   Procedure Preparation included 5% betadine to ocular surface, eyelid speculum. A 30 gauge needle was used.   Injection:  1.25 mg Bevacizumab (AVASTIN) SOLN   NDC: 77414-239-53, Lot: [id number] , Expiration date: 09/11/2018   Route: Intravitreal, Site: Left Eye, Waste: 0 mL  Post-op Post injection exam found visual acuity of at least counting fingers. The patient tolerated the procedure well. There were no complications. The patient received written and verbal post procedure care education.                 ASSESSMENT/PLAN:    ICD-10-CM   1. Moderate nonproliferative diabetic retinopathy of both eyes with macular edema associated with type 2 diabetes mellitus (HCC) U02.3343 Intravitreal Injection, Pharmacologic Agent - OS - Left Eye  2. Retinal edema H35.81 OCT, Retina - OU - Both Eyes  3. Essential hypertension I10   4. Hypertensive retinopathy of both eyes H35.033   5. Combined forms of age-related cataract of both eyes H25.813     1,2 Moderate non-proliferative diabetic retinopathy w/ DME OU (OS > OD) - The incidence, risk factors for progression, natural history and treatment options for diabetic retinopathy  were discussed with patient.   - The need for close monitoring of blood glucose, blood pressure, and serum lipids, avoiding cigarette or any type of tobacco, and the need for long term follow up was also discussed with patient. - Initial exam shows scattered MA, BCVA 20/50 OS - Initial FA (05/12/18) shows leaking MA, no NV OS - Initial OCT showed diabetic macular edema OU (OS > OD)  - s/p IVA #1 OS (10.22.19), #2 (11.20.19) - Today BCVA stable at  20/50 OS.  OCT w/ persisent IRF OS. - recommend IVA #3 OS today, 12.20.19 - pt wishes to proceed - RBA of procedure discussed, questions answered - informed consent obtained and signed - see procedure note - f/u in 6 wks  3,4. Hypertensive retinopathy OU - discussed importance of tight BP control - monitor  5. Combined Form Cataracts OU (OS > OD) - The symptoms of cataract, surgical options, and treatments and risks were discussed with patient. - discussed diagnosis and progression - cataract OS likely visually significant, OD borderline - under the expert management of Dr. [name] - clear from a retina standpoint to proceed with cataract surgery when pt and surgeon are ready - recommend coordinating care so that pt receives anti-VEGF 1-2 wks prior to cataract surgery    Ophthalmic Meds Ordered this visit:  No orders of the defined types were placed in this encounter.      Return in about 6 weeks (around 08/21/2018) for F/U NPDR OU, DFE, OCT.  There are no Patient Instructions on file for this visit.   Explained the diagnoses, plan, and follow up with the patient and they expressed understanding.  Patient expressed understanding of the importance of proper follow up care.   This document serves as a record of services personally performed by Gardiner Sleeper, MD, PhD. It was created on their behalf by Ernest Mallick, OA, an ophthalmic assistant. The creation of this record  is the provider's dictation and/or activities during the visit.    Electronically signed by: Ernest Mallick, OA  12.18.19 1:06 PM   Gardiner Sleeper, M.D., Ph.D. Diseases & Surgery of the Retina and Vitreous [facility]   I have reviewed the above documentation for accuracy and completeness, and I agree with the above. Gardiner Sleeper, M.D., Ph.D. 07/10/18 1:08 PM     Abbreviations: M myopia (nearsighted); A astigmatism; H hyperopia (farsighted); P presbyopia; Mrx spectacle  prescription;  CTL contact lenses; OD right eye; OS left eye; OU both eyes  XT exotropia; ET esotropia; PEK punctate epithelial keratitis; PEE punctate epithelial erosions; DES dry eye syndrome; MGD meibomian gland dysfunction; ATs artificial tears; PFAT's preservative free artificial tears; [place] nuclear sclerotic cataract; PSC posterior subcapsular cataract; ERM epi-retinal membrane; PVD posterior vitreous detachment; RD retinal detachment; DM diabetes mellitus; DR diabetic retinopathy; NPDR non-proliferative diabetic retinopathy; PDR proliferative diabetic retinopathy; CSME clinically significant macular edema; DME diabetic macular edema; dbh dot blot hemorrhages; CWS cotton wool spot; POAG primary open angle glaucoma; C/D cup-to-disc ratio; HVF humphrey visual field; GVF goldmann visual field; OCT optical coherence tomography; IOP intraocular pressure; BRVO Branch retinal vein occlusion; CRVO central retinal vein occlusion; CRAO central retinal artery occlusion; BRAO branch retinal artery occlusion; RT retinal tear; SB scleral buckle; PPV pars plana vitrectomy; VH Vitreous hemorrhage; PRP panretinal laser photocoagulation; IVK intravitreal kenalog; VMT vitreomacular traction; MH Macular hole;  NVD neovascularization of the disc; NVE neovascularization elsewhere; AREDS age related eye disease study; ARMD age related macular degeneration; POAG primary open angle glaucoma; EBMD epithelial/anterior basement membrane dystrophy; ACIOL anterior chamber intraocular lens; IOL intraocular lens; PCIOL posterior chamber intraocular lens; Phaco/IOL phacoemulsification with intraocular lens placement; [place] photorefractive keratectomy; LASIK laser assisted in situ keratomileusis; HTN hypertension; DM diabetes mellitus; COPD chronic obstructive pulmonary disease

## 2018-07-08 NOTE — Assessment & Plan Note (Signed)
History of essential hypertension blood pressure measured today 132/64.  She is on amlodipine and carvedilol.  Continue current meds at current dosing.

## 2018-07-10 ENCOUNTER — Encounter (INDEPENDENT_AMBULATORY_CARE_PROVIDER_SITE_OTHER): Payer: Self-pay | Admitting: Ophthalmology

## 2018-07-10 ENCOUNTER — Ambulatory Visit (INDEPENDENT_AMBULATORY_CARE_PROVIDER_SITE_OTHER): Payer: Medicare Other | Admitting: Ophthalmology

## 2018-07-10 DIAGNOSIS — H35033 Hypertensive retinopathy, bilateral: Secondary | ICD-10-CM | POA: Diagnosis not present

## 2018-07-10 DIAGNOSIS — I10 Essential (primary) hypertension: Secondary | ICD-10-CM

## 2018-07-10 DIAGNOSIS — H3581 Retinal edema: Secondary | ICD-10-CM | POA: Diagnosis not present

## 2018-07-10 DIAGNOSIS — H25813 Combined forms of age-related cataract, bilateral: Secondary | ICD-10-CM

## 2018-07-10 DIAGNOSIS — E113313 Type 2 diabetes mellitus with moderate nonproliferative diabetic retinopathy with macular edema, bilateral: Secondary | ICD-10-CM | POA: Diagnosis not present

## 2018-07-10 DIAGNOSIS — N2581 Secondary hyperparathyroidism of renal origin: Secondary | ICD-10-CM | POA: Diagnosis not present

## 2018-07-10 MED ORDER — BEVACIZUMAB CHEMO INJECTION 1.25MG/0.05ML SYRINGE FOR KALEIDOSCOPE
1.2500 mg | INTRAVITREAL | Status: DC
  Administered 2018-07-10: 1.25 mg via INTRAVITREAL

## 2018-07-13 ENCOUNTER — Ambulatory Visit (HOSPITAL_COMMUNITY)
Admission: RE | Admit: 2018-07-13 | Discharge: 2018-07-13 | Disposition: A | Discharge status: Home / Self Care | Payer: Medicare Other | Source: Ambulatory Visit | Attending: Internal Medicine | Admitting: Internal Medicine

## 2018-07-13 DIAGNOSIS — R0989 Other specified symptoms and signs involving the circulatory and respiratory systems: Secondary | ICD-10-CM | POA: Diagnosis not present

## 2018-07-14 ENCOUNTER — Other Ambulatory Visit: Payer: Self-pay | Admitting: Family Medicine

## 2018-07-16 ENCOUNTER — Other Ambulatory Visit: Payer: Self-pay | Admitting: *Deleted

## 2018-07-16 MED ORDER — INSULIN DEGLUDEC 100 UNIT/ML ~~LOC~~ SOPN: PEN_INJECTOR | SUBCUTANEOUS | 2 refills | Status: DC

## 2018-07-23 ENCOUNTER — Ambulatory Visit (INDEPENDENT_AMBULATORY_CARE_PROVIDER_SITE_OTHER): Payer: Medicare Other

## 2018-07-23 DIAGNOSIS — R002 Palpitations: Secondary | ICD-10-CM | POA: Diagnosis not present

## 2018-08-13 DIAGNOSIS — R002 Palpitations: Secondary | ICD-10-CM | POA: Diagnosis not present

## 2018-08-14 DIAGNOSIS — N184 Chronic kidney disease, stage 4 (severe): Secondary | ICD-10-CM | POA: Diagnosis not present

## 2018-08-18 NOTE — Progress Notes (Signed)
[facility] Note  08/21/2018     CHIEF COMPLAINT Patient presents for Retina Follow Up   HISTORY OF PRESENT ILLNESS: [name] is a 68 y.o. female who presents to the clinic today for:   HPI    Retina Follow Up    Patient presents with  Diabetic Retinopathy.  In both eyes.  Severity: Mild OD, severe OS.  Duration of 6 weeks.  Since onset it is gradually improving.  I, the attending physician,  performed the HPI with the patient and updated documentation appropriately.          Comments    Patient states vision improved some OU. BS was 164 this am. Last a1c unknown.        Last edited by [name], MD on 08/21/2018 11:23 AM. (History)    pt states her vision is doing well, she states she has a hard time driving at night, pt states she sees Dr. [name] in March  Referring physician: [name], MD [id number] [facility] [place], [place] [address]  HISTORICAL INFORMATION:   Selected notes from the MEDICAL RECORD NUMBER Referred by Dr. [name] for concern of BRVO OS LEE: 10.03.19 (C. Weaver) [BCVA: OD: 20/30 OS: 20/50] Ocular Hx-cataracts OU, HTR OU, NPDR OU, DES PMH-DM (Y7W: 7.3, takes Trulicity and [place]), HTN, HLD     CURRENT MEDICATIONS: No current outpatient medications on file. (Ophthalmic Drugs)   No current facility-administered medications for this visit.  (Ophthalmic Drugs)   Current Outpatient Medications (Other)  Medication Sig  . amLODipine (NORVASC) 10 MG tablet TAKE ONE TABLET BY MOUTH ONCE DAILY (Patient taking differently: Take 5 mg by mouth daily. )  . Blood Glucose Monitoring Suppl (ONE TOUCH ULTRA 2) w/Device KIT Use to check BS BID-QID Dx:E11.9  . BREO ELLIPTA 100-25 MCG/INH AEPB INHALE 1 PUFF BY MOUTH ONCE DAILY (Patient taking differently: Inhale 1 puff into the lungs daily. )  . calcitRIOL (ROCALTROL) 0.5 MCG capsule Take 0.5 mcg by mouth daily.  . carvedilol (COREG) 3.125 MG tablet Take 6.25 mg by  mouth 2 (two) times daily.  . carvedilol (COREG) 6.25 MG tablet Take 1 tablet (6.25 mg total) by mouth 2 (two) times daily.  Marland Kitchen CINNAMON PO Take 350 mg by mouth daily.   . Dulaglutide (TRULICITY) 2.95 AO/1.3YQ SOPN Inject 0.75 mg into the skin every Wednesday.  . furosemide (LASIX) 20 MG tablet TAKE 1 TABLET BY MOUTH ONCE DAILY  . gabapentin (NEURONTIN) 300 MG capsule TAKE 1 CAPSULE BY MOUTH THREE TIMES DAILY (Patient taking differently: Take 300 mg by mouth 3 (three) times daily as needed (for pain/neuropathy in feet/hands.). )  . glucose blood (ONE TOUCH ULTRA TEST) test strip Use as instructed  . insulin degludec (TRESIBA FLEXTOUCH) 100 UNIT/ML SOPN FlexTouch Pen INJECT 150 UNITS SUBCUTANEOUSLY ONCE DAILY  . Lancets (ONETOUCH ULTRASOFT) lancets Use as instructed  . oxyCODONE-acetaminophen (PERCOCET) 5-325 MG tablet Take 1 tablet by mouth every 6 (six) hours as needed for severe pain.  . polyethylene glycol (MIRALAX / GLYCOLAX) packet Take 17 g by mouth daily as needed for mild constipation.  Marland Kitchen PROAIR HFA 108 (90 Base) MCG/ACT inhaler INHALE TWO PUFFS INTO LUNGS EVERY 6 HOURS AS NEEDED FOR WHEEZING AND FOR SHORTNESS OF BREATH (Patient taking differently: Inhale 2 puffs into the lungs every 6 (six) hours as needed for wheezing or shortness of breath. )  . rosuvastatin (CRESTOR) 20 MG tablet Take 1 tablet (20  mg total) by mouth daily. (Patient taking differently: Take 20 mg by mouth every evening. )  . sodium bicarbonate 650 MG tablet Take 650 mg by mouth 2 (two) times daily.  . TRADJENTA 5 MG TABS tablet TAKE 1 TABLET BY MOUTH ONCE DAILY (Patient taking differently: Take 5 mg by mouth daily. )  . VITAMIN A PO Take 2,400 Units by mouth daily.  . calcitRIOL (ROCALTROL) 0.25 MCG capsule Take 0.25 mcg by mouth daily.   Current Facility-Administered Medications (Other)  Medication Route  . Bevacizumab (AVASTIN) SOLN 1.25 mg Intravitreal  . Bevacizumab (AVASTIN) SOLN 1.25 mg Intravitreal  .  Bevacizumab (AVASTIN) SOLN 1.25 mg Intravitreal  . Bevacizumab (AVASTIN) SOLN 1.25 mg Intravitreal      REVIEW OF SYSTEMS: ROS    Positive for: Endocrine, Eyes   Negative for: Constitutional, Gastrointestinal, Neurological, Skin, Genitourinary, Musculoskeletal, HENT, Cardiovascular, Respiratory, Psychiatric, Allergic/Imm, Heme/Lymph   Last edited by Roselee Nova D on 08/21/2018 10:20 AM. (History)       ALLERGIES Allergies  Allergen Reactions  . Penicillins Other (See Comments)    UNSPECIFIED CHILDHOOD REACTION  Has patient had a PCN reaction causing immediate rash, facial/tongue/throat swelling, SOB or lightheadedness with hypotension: Unknown Has patient had a PCN reaction causing severe rash involving mucus membranes or skin necrosis: Unknown Has patient had a PCN reaction that required hospitalization: Unknown Has patient had a PCN reaction occurring within the last 10 years: Unknown If all of the above answers are "NO", then may proceed with Cephalosporin use.   . Adhesive [Tape] Rash    PAST MEDICAL HISTORY Past Medical History:  Diagnosis Date  . Anemia   . Arthritis   . Asthma   . CKD stage 3 due to type 2 diabetes mellitus ([place])   . Depression   . Diabetic retinopathy of both eyes ([place])   . GERD (gastroesophageal reflux disease)    pt denies  . HLD (hyperlipidemia)   . Hypertension   . Microalbuminuria due to type 2 diabetes mellitus ([place])   . Neuromuscular disorder ([place])    diabetic neuropathy  . Nonproliferative retinopathy due to secondary diabetes ([place])   . Pneumonia    hx of   . Renal cell cancer ([place])   . 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 BASCILIC VEIN TRANSPOSITION Left 08/08/2017   Procedure: BASILIC VEIN TRANSPOSITION FIRST STAGE LEFT ARM;  Surgeon: Serafina Mitchell, MD;  Location: [facility];  Service: Vascular;  Laterality: Left;  . BASCILIC VEIN TRANSPOSITION Left  05/14/2018   Procedure: SECOND STAGE BASILIC VEIN TRANSPOSITION LEFT ARM;  Surgeon: Serafina Mitchell, MD;  Location: [facility];  Service: Vascular;  Laterality: Left;  . ROBOTIC ADRENALECTOMY Left 03/10/2017   Procedure: XI ROBOTIC ADRENALECTOMY;  Surgeon: Nickie Retort, MD;  Location: [facility];  Service: Urology;  Laterality: Left;  . ROBOTIC ASSITED PARTIAL NEPHRECTOMY Right 03/10/2017   Procedure: XI ROBOTIC ASSITED RADICAL NEPHRECTOMY;  Surgeon: Nickie Retort, MD;  Location: [facility];  Service: Urology;  Laterality: Right;  . TOE AMPUTATION Bilateral    both great toe ,, left foot 2nd toe 1/2    FAMILY HISTORY Family History  Problem Relation Age of Onset  . Heart disease Mother   . Diabetes Sister     SOCIAL HISTORY Social History   Tobacco Use  . Smoking status: Never Smoker  . Smokeless tobacco: Never Used  Substance Use Topics  .  Alcohol use: Yes    Alcohol/week: 0.0 standard drinks    Comment: occ  . Drug use: No         OPHTHALMIC EXAM:  Base Eye Exam    Visual Acuity (Snellen - Linear)      Right Left   Dist [place] 20/30 -2 20/70 -2   Dist ph [place] 20/25 -2 20/40 -1       Tonometry (Tonopen, 10:28 AM)      Right Left   Pressure 15 14       Pupils      Dark Light Shape React APD   Right 3 2 Round Brisk None   Left 3 2 Round Brisk None       Visual Fields (Counting fingers)      Left Right    Full Full       Extraocular Movement      Right Left    Full, Ortho Full, Ortho       Neuro/Psych    Oriented x3:  Yes   Mood/Affect:  Normal       Dilation    Both eyes:  1.0% Mydriacyl, 2.5% Phenylephrine @ 10:28 AM        Slit Lamp and Fundus Exam    Slit Lamp Exam      Right Left   Lids/Lashes Dermatochalasis - upper lid, mild MGD Dermatochalasis - upper lid, Telangiectasia, mild MGD   Conjunctiva/Sclera White and quiet White and quiet   Cornea 1+ Punctate epithelial erosions, mild Arcus 1+ Punctate epithelial erosions   Anterior Chamber Deep  and quiet Deep and quiet   Iris Round and dilated, No NVI Round and well dilated   Lens 2+ Nuclear sclerosis, 2-3+ Cortical cataract 2-3+ Nuclear sclerosis, early brunescense, 2-3+ Cortical cataract, 1-2+ Posterior subcapsular cataract   Vitreous Vitreous syneresis, Posterior vitreous detachment Vitreous syneresis       Fundus Exam      Right Left   Disc Pink and Sharp Pink and Sharp   C/D Ratio 0.3 0.25   Macula Blunted foveal reflex, scattered MA and IRH, cotton wool spot IN macula w/surrounding blot heme - improved Blunted foveal reflex, scattered DBH, small cluster of DBH and CWS IN macula - mildly improved.   Vessels Vascular attenuation, Tortuous, AV crossing changes Tortuous, Vascular attenuation, AV crossing changes   Periphery Attached, scattered DBH and small CWS nasal to disc Attached, scattered MA/DBH          IMAGING AND PROCEDURES  Imaging and Procedures for @TODAY @  OCT, Retina - OU - Both Eyes       Right Eye Quality was good. Central Foveal Thickness: 252. Progression has been stable. Findings include normal foveal contour, no SRF, no IRF (Interval improvement in IRF, inner-retinal thickening along proximal IT arcades.).   Left Eye Quality was borderline. Central Foveal Thickness: 260. Progression has been stable. Findings include normal foveal contour, intraretinal fluid, no SRF, vitreomacular adhesion , intraretinal hyper-reflective material (Peristent IRF/IRHM IN to fovea).   Notes *Images captured and stored on drive  Diagnosis / Impression:  NFP; +IRF; no SRF OU noncentral DME OU (OS > OD) OD - Interval improvement in IRF, inner-retinal thickening along proximal IT arcades. OS - Persistent IRF/IRHM inf nasal fovea  Clinical management:  See below  Abbreviations: NFP - Normal foveal profile. CME - cystoid macular edema. PED - pigment epithelial detachment. IRF - intraretinal fluid. SRF - subretinal fluid. EZ - ellipsoid zone. ERM - epiretinal membrane.  ORA - outer retinal atrophy. ORT - outer retinal tubulation. SRHM - subretinal hyper-reflective material        Intravitreal Injection, Pharmacologic Agent - OS - Left Eye       Time Out 08/21/2018. 11:40 AM. Confirmed correct patient, procedure, site, and patient consented.   Anesthesia Topical anesthesia was used. Anesthetic medications included Lidocaine 2%, Proparacaine 0.5%.   Procedure Preparation included 5% betadine to ocular surface, eyelid speculum. A 30 gauge needle was used.   Injection:  1.25 mg Bevacizumab (AVASTIN) SOLN   NDC: 45364-680-32, Lot: [id number]'$ , Expiration date: 09/01/2018   Route: Intravitreal, Site: Left Eye, Waste: 0 mL  Post-op Post injection exam found visual acuity of at least counting fingers. The patient tolerated the procedure well. There were no complications. The patient received written and verbal post procedure care education.                 ASSESSMENT/PLAN:    ICD-10-CM   1. Moderate nonproliferative diabetic retinopathy of both eyes with macular edema associated with type 2 diabetes mellitus (HCC) 15/05/2019 Intravitreal Injection, Pharmacologic Agent - OS - Left Eye    Bevacizumab (AVASTIN) SOLN 1.25 mg  2. Retinal edema H35.81 OCT, Retina - OU - Both Eyes  3. Essential hypertension I10   4. Hypertensive retinopathy of both eyes H35.033   5. Combined forms of age-related cataract of both eyes H25.813     1,2 Moderate non-proliferative diabetic retinopathy w/ DME OU (OS > OD) - Initial exam shows scattered MA, BCVA 20/50 OS - Initial FA (05/12/18) shows leaking MA, no NV OS - Initial OCT showed diabetic macular edema OU (OS > OD)  - s/p IVA #1 OS (10.22.19), #2 (11.20.19), #3 (12.20.19) - Today BCVA improved to 20/40-1 OS.   - OCT w/ improved IRF/IRHM OS. - recommend IVA #4 OS today, 01.31.20 - pt wishes to proceed - RBA of procedure discussed, questions answered - informed consent obtained and signed - see procedure  note - f/u in 6 wks  3,4. Hypertensive retinopathy OU - discussed importance of tight BP control - monitor  5. Combined Form Cataracts OU (OS > OD) - The symptoms of cataract, surgical options, and treatments and risks were discussed with patient. - discussed diagnosis and progression - cataract OS likely visually significant, OD borderline - under the expert management of Dr. 02.13.20 - clear from a retina standpoint to proceed with cataract surgery when pt and surgeon are ready - recommend coordinating care so that pt receives anti-VEGF 1-2 wks prior to cataract surgery    Ophthalmic Meds Ordered this visit:  Meds ordered this encounter  Medications  . Bevacizumab (AVASTIN) SOLN 1.25 mg       Return in about 6 weeks (around 10/02/2018) for f/u NPDR, DFE, OCT.  There are no Patient Instructions on file for this visit.   Explained the diagnoses, plan, and follow up with the patient and they expressed understanding.  Patient expressed understanding of the importance of proper follow up care.   This document serves as a record of services personally performed by 10/15/2018, MD, PhD. It was created on their behalf by Gardiner Sleeper, OA, an ophthalmic assistant. The creation of this record is the provider's dictation and/or activities during the visit.    Electronically signed by: Ernest Mallick, OA  01.28.2020 11:37 PM    02.10.2020, M.D., Ph.D. Diseases & Surgery of the Retina and Vitreous [facility]   I have  reviewed the above documentation for accuracy and completeness, and I agree with the above. Gardiner Sleeper, M.D., Ph.D. 08/23/18 11:42 PM    Abbreviations: M myopia (nearsighted); A astigmatism; H hyperopia (farsighted); P presbyopia; Mrx spectacle prescription;  CTL contact lenses; OD right eye; OS left eye; OU both eyes  XT exotropia; ET esotropia; PEK punctate epithelial keratitis; PEE punctate epithelial erosions; DES dry eye syndrome;  MGD meibomian gland dysfunction; ATs artificial tears; PFAT's preservative free artificial tears; [place] nuclear sclerotic cataract; PSC posterior subcapsular cataract; ERM epi-retinal membrane; PVD posterior vitreous detachment; RD retinal detachment; DM diabetes mellitus; DR diabetic retinopathy; NPDR non-proliferative diabetic retinopathy; PDR proliferative diabetic retinopathy; CSME clinically significant macular edema; DME diabetic macular edema; dbh dot blot hemorrhages; CWS cotton wool spot; POAG primary open angle glaucoma; C/D cup-to-disc ratio; HVF humphrey visual field; GVF goldmann visual field; OCT optical coherence tomography; IOP intraocular pressure; BRVO Branch retinal vein occlusion; CRVO central retinal vein occlusion; CRAO central retinal artery occlusion; BRAO branch retinal artery occlusion; RT retinal tear; SB scleral buckle; PPV pars plana vitrectomy; VH Vitreous hemorrhage; PRP panretinal laser photocoagulation; IVK intravitreal kenalog; VMT vitreomacular traction; MH Macular hole;  NVD neovascularization of the disc; NVE neovascularization elsewhere; AREDS age related eye disease study; ARMD age related macular degeneration; POAG primary open angle glaucoma; EBMD epithelial/anterior basement membrane dystrophy; ACIOL anterior chamber intraocular lens; IOL intraocular lens; PCIOL posterior chamber intraocular lens; Phaco/IOL phacoemulsification with intraocular lens placement; [place] photorefractive keratectomy; LASIK laser assisted in situ keratomileusis; HTN hypertension; DM diabetes mellitus; COPD chronic obstructive pulmonary disease

## 2018-08-21 ENCOUNTER — Ambulatory Visit (INDEPENDENT_AMBULATORY_CARE_PROVIDER_SITE_OTHER): Payer: Medicare Other | Admitting: Ophthalmology

## 2018-08-21 ENCOUNTER — Encounter (INDEPENDENT_AMBULATORY_CARE_PROVIDER_SITE_OTHER): Payer: Self-pay | Admitting: Ophthalmology

## 2018-08-21 DIAGNOSIS — H25813 Combined forms of age-related cataract, bilateral: Secondary | ICD-10-CM

## 2018-08-21 DIAGNOSIS — I10 Essential (primary) hypertension: Secondary | ICD-10-CM

## 2018-08-21 DIAGNOSIS — E113313 Type 2 diabetes mellitus with moderate nonproliferative diabetic retinopathy with macular edema, bilateral: Secondary | ICD-10-CM | POA: Diagnosis not present

## 2018-08-21 DIAGNOSIS — H35033 Hypertensive retinopathy, bilateral: Secondary | ICD-10-CM | POA: Diagnosis not present

## 2018-08-21 DIAGNOSIS — H3581 Retinal edema: Secondary | ICD-10-CM

## 2018-08-21 MED ORDER — BEVACIZUMAB CHEMO INJECTION 1.25MG/0.05ML SYRINGE FOR KALEIDOSCOPE
1.2500 mg | INTRAVITREAL | Status: DC
  Administered 2018-08-21: 1.25 mg via INTRAVITREAL

## 2018-08-23 ENCOUNTER — Encounter (INDEPENDENT_AMBULATORY_CARE_PROVIDER_SITE_OTHER): Payer: Self-pay | Admitting: Ophthalmology

## 2018-08-26 ENCOUNTER — Other Ambulatory Visit: Payer: Self-pay | Admitting: Family Medicine

## 2018-08-28 ENCOUNTER — Encounter: Payer: Self-pay | Admitting: Cardiovascular Disease

## 2018-08-28 ENCOUNTER — Ambulatory Visit (INDEPENDENT_AMBULATORY_CARE_PROVIDER_SITE_OTHER): Payer: Medicare Other | Admitting: Cardiovascular Disease

## 2018-08-28 VITALS — BP 138/90 | HR 79 | Ht 68.0 in | Wt 242.4 lb

## 2018-08-28 DIAGNOSIS — I472 Ventricular tachycardia: Secondary | ICD-10-CM | POA: Insufficient documentation

## 2018-08-28 DIAGNOSIS — I10 Essential (primary) hypertension: Secondary | ICD-10-CM | POA: Diagnosis not present

## 2018-08-28 DIAGNOSIS — I4729 Other ventricular tachycardia: Secondary | ICD-10-CM

## 2018-08-28 HISTORY — DX: Ventricular tachycardia: I47.2

## 2018-08-28 HISTORY — DX: Other ventricular tachycardia: I47.29

## 2018-08-28 MED ORDER — CARVEDILOL 12.5 MG PO TABS: 6.2500 mg | ORAL_TABLET | Freq: Two times a day (BID) | ORAL | 3 refills | Status: DC

## 2018-08-28 NOTE — Progress Notes (Signed)
Ms. Montejano was initially referred to me by Dr. Dennard Schaumann because of coronary calcification on chest CT.  A subsequent Myoview stress test was nonischemic.  2D echo revealed moderate LV dysfunction with an EF in the 40% range.  She was experiencing palpitations and a 30-day event monitor showed runs of nonsustained VT.  She occasionally feels dizzy but is unaware of whether these are associated.  I am going to increase her carvedilol from 6.25 mg to 12.5 mg twice daily and will refer her to 1 of our electrophysiologist for further evaluation.  Her twelve-lead EKG today revealed sinus rhythm at 79 with septal Q waves and left axis deviation.  I personally reviewed this EKG.  Lorretta Harp, M.D., [place], [facility], [place] [address] [place], [place]  [address]  [phone] 08/28/2018 11:40 AM

## 2018-08-28 NOTE — Patient Instructions (Signed)
Medication Instructions:  Your physician has recommended you make the following change in your medication:  START CARVEDILOL 12.5 MG, ONE TABLET BY MOUTH TWICE A DAY  If you need a refill on your cardiac medications before your next appointment, please call your pharmacy.   Lab work: NONE If you have labs (blood work) drawn today and your tests are completely normal, you will receive your results only by: Marland Kitchen MyChart Message (if you have MyChart) OR . A paper copy in the mail If you have any lab test that is abnormal or we need to change your treatment, we will call you to review the results.  Testing/Procedures: NONE  Follow-Up: At [facility], you and your health needs are our priority.  As part of our continuing mission to provide you with exceptional heart care, we have created designated Provider Care Teams.  These Care Teams include your primary Cardiologist (physician) and Advanced Practice Providers (APPs -  Physician Assistants and Nurse Practitioners) who all work together to provide you with the care you need, when you need it. . You will need a follow up appointment in 3 months. You may see Dr. Gwenlyn Found or one of the following Advanced Practice Providers on your designated Care Team:   . Kerin Ransom, [place] . Almyra Deforest, PA-C . Fabian Sharp, PA-C . Jory Sims, DNP . Rosaria Ferries, PA-C . Roby Lofts, PA-C . Sande Rives, PA-C  Any Other Special Instructions Will Be Listed Below (If Applicable). REFERRAL TO ELECTROPHYSIOLOGY

## 2018-08-28 NOTE — Assessment & Plan Note (Signed)
[name] was initially referred to me by Dr. Dennard Schaumann because of coronary calcification on chest CT.  A subsequent Myoview stress test was nonischemic.  2D echo revealed moderate LV dysfunction with an EF in the 40% range.  She was experiencing palpitations and a 30-day event monitor showed runs of nonsustained VT.  She occasionally feels dizzy but is unaware of whether these are associated.  I am going to increase her carvedilol from 6.25 mg to 12.5 mg twice daily and will refer her to 1 of our electrophysiologist for further evaluation.

## 2018-09-04 ENCOUNTER — Telehealth: Payer: Self-pay | Admitting: Cardiovascular Disease

## 2018-09-04 MED ORDER — CARVEDILOL 12.5 MG PO TABS: 12.5000 mg | ORAL_TABLET | Freq: Two times a day (BID) | ORAL | 3 refills | Status: DC

## 2018-09-04 NOTE — Telephone Encounter (Signed)
Returned pt call. Pt was seen by Dr.Berry on 08/28/18 and Carvedilol was increased to 12.5mg  bid. Adv pt that the Carvedilol was not refilled for the increased dose of 12.5mg  bid. Pt sts that she did pick up the new Rx, it is 12.5mg  tabs but the instructions are 1/2 tab bid. Adv pt to take 1 tab (12.5mg ) bid as instructed by Dr.Berry. I have sent in the correct dosage for 12.5mg  bid with a message to the pharmacy that it is a dosage increase. I apologized to the pt for the in convenience. Pt was very understanding and verbalized understanding to the instruction given.

## 2018-09-04 NOTE — Telephone Encounter (Signed)
New Message:     Please call, her Carvedilol was called in wrong.

## 2018-09-08 DIAGNOSIS — E1122 Type 2 diabetes mellitus with diabetic chronic kidney disease: Secondary | ICD-10-CM | POA: Diagnosis not present

## 2018-09-08 DIAGNOSIS — N2581 Secondary hyperparathyroidism of renal origin: Secondary | ICD-10-CM | POA: Diagnosis not present

## 2018-09-08 DIAGNOSIS — D631 Anemia in chronic kidney disease: Secondary | ICD-10-CM | POA: Diagnosis not present

## 2018-09-08 DIAGNOSIS — D509 Iron deficiency anemia, unspecified: Secondary | ICD-10-CM | POA: Diagnosis not present

## 2018-09-08 DIAGNOSIS — N184 Chronic kidney disease, stage 4 (severe): Secondary | ICD-10-CM | POA: Diagnosis not present

## 2018-09-10 ENCOUNTER — Telehealth: Payer: Self-pay | Admitting: Family Medicine

## 2018-09-10 NOTE — Telephone Encounter (Signed)
Patient is having a hard time going to the bathroom, would like some advice of something she can take for this  [phone]

## 2018-09-11 NOTE — Telephone Encounter (Signed)
Informed pt she could take fiber otc and ducolax and if no better she can call us back.

## 2018-09-16 ENCOUNTER — Encounter (INDEPENDENT_AMBULATORY_CARE_PROVIDER_SITE_OTHER): Payer: Self-pay

## 2018-09-16 ENCOUNTER — Encounter: Payer: Self-pay | Admitting: Cardiology

## 2018-09-16 ENCOUNTER — Other Ambulatory Visit: Payer: Self-pay | Admitting: Family Medicine

## 2018-09-16 ENCOUNTER — Ambulatory Visit: Payer: Medicare Other | Admitting: Cardiology

## 2018-09-16 VITALS — BP 118/62 | HR 76 | Ht 68.0 in | Wt 244.0 lb

## 2018-09-16 DIAGNOSIS — I472 Ventricular tachycardia, unspecified: Secondary | ICD-10-CM

## 2018-09-16 MED ORDER — ISOSORBIDE MONONITRATE ER 30 MG PO TB24: 30.0000 mg | ORAL_TABLET | Freq: Every day | ORAL | 3 refills | Status: DC

## 2018-09-16 MED ORDER — HYDRALAZINE HCL 25 MG PO TABS: 25.0000 mg | ORAL_TABLET | Freq: Three times a day (TID) | ORAL | 3 refills | Status: DC

## 2018-09-16 MED ORDER — METOPROLOL SUCCINATE ER 100 MG PO TB24: 100.0000 mg | ORAL_TABLET | Freq: Every day | ORAL | 3 refills | Status: DC

## 2018-09-16 NOTE — Patient Instructions (Addendum)
Medication Instructions:  Your physician has recommended you make the following change in your medication:  1. STOP Carvedilol (Coreg) 2. START Toprol XL 100 mg daily at bedtime 3. STOP Amlodipine (Norvasc) 4. START Hydralazine 25 mg three times daily 5. START Imdur 30 mg daily   * If you need a refill on your cardiac medications before your next appointment, please call your pharmacy.   Labwork: None ordered  Testing/Procedures: None ordered  Follow-Up: Your physician recommends that you schedule a follow-up appointment in: 3 months with Dr. Curt Bears.   Thank you for choosing [facility]!!   Trinidad Curet, RN [phone]  Any Other Special Instructions Will Be Listed Below (If Applicable).

## 2018-09-16 NOTE — Progress Notes (Signed)
Electrophysiology Office Note   Date:  09/16/2018   ID:  Betul, Brisky 1951-05-28, MRN [id number]  PCP:  [name], MD  Cardiologist:  Gwenlyn Found Primary Electrophysiologist:  Constance Haw, MD    No chief complaint on file.    History of Present Illness: [name] is a 68 y.o. female who is being seen today for the evaluation of NSVT at the request of Lorretta Harp, MD. Presenting today for electrophysiology evaluation.  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  She recently had a dialysis fistula placed.  She was having palpitations and wore a 30-day monitor that showed runs of nonsustained VT.  Her carvedilol was increased.  She has been feeling poorly over the last few weeks.  She says that she just simply feels weak and fatigued.  This is been worse since her carvedilol dose was increased.  Due to that, she has not been fully compliant with her medications.  She is taking carvedilol at times once a day.  She has continued to have palpitations.  Her palpitations last a few minutes at a time.  She says that when she wore her cardiac monitor she did not have the same palpitations.  She did have nonsustained VT on her monitor, which lasted at most 13 beats.  Today, she denies symptoms of palpitations, chest pain, shortness of breath, orthopnea, PND, lower extremity edema, claudication, dizziness, presyncope, syncope, bleeding, or neurologic sequela. The patient is tolerating medications without difficulties.    Past Medical History:  Diagnosis Date  . Anemia   . Arthritis   . Asthma   . CKD stage 3 due to type 2 diabetes mellitus ([place])   . Coronary artery calcification seen on CAT scan 02/17/2018   Coronary calcification on CT  . Depression   . Diabetic retinopathy of both eyes ([place])   . Diabetic ulcer of right foot associated with type 2 diabetes  mellitus ([place])   . Essential hypertension 02/17/2018   Essential hypertension  . GERD (gastroesophageal reflux disease)    pt denies  . HLD (hyperlipidemia)   . Hypertension   . Left ventricular dysfunction 04/07/2018   Left ventricle dysfunction  . Microalbuminuria due to type 2 diabetes mellitus ([place])   . Neuromuscular disorder ([place])    diabetic neuropathy  . Nonproliferative retinopathy due to secondary diabetes ([place])   . Nonsustained ventricular tachycardia ([place]) 08/28/2018   Nonsustained ventricular tachycardia  . Pneumonia    hx of   . Renal cell cancer ([place])   . Right renal mass 03/10/2017  . Type 2 diabetes, controlled, with neuropathy ([place]) 06/22/2013  . Ulcer of other part of foot 06/22/2013  . 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 BASCILIC VEIN TRANSPOSITION Left 08/08/2017   Procedure: BASILIC VEIN TRANSPOSITION FIRST STAGE LEFT ARM;  Surgeon: Serafina Mitchell, MD;  Location: [place];  Service: Vascular;  Laterality: Left;  . BASCILIC VEIN TRANSPOSITION Left 05/14/2018   Procedure: SECOND STAGE BASILIC VEIN TRANSPOSITION LEFT ARM;  Surgeon: Serafina Mitchell, MD;  Location: [place];  Service: Vascular;  Laterality: Left;  . ROBOTIC ADRENALECTOMY Left 03/10/2017   Procedure: XI ROBOTIC ADRENALECTOMY;  Surgeon: Nickie Retort, MD;  Location: [facility];  Service: Urology;  Laterality: Left;  . ROBOTIC ASSITED PARTIAL NEPHRECTOMY Right 03/10/2017   Procedure:  XI ROBOTIC ASSITED RADICAL NEPHRECTOMY;  Surgeon: Nickie Retort, MD;  Location: [facility];  Service: Urology;  Laterality: Right;  . TOE AMPUTATION Bilateral    both great toe ,, left foot 2nd toe 1/2     Current Outpatient Medications  Medication Sig Dispense Refill  . amLODipine (NORVASC) 10 MG tablet TAKE ONE TABLET BY MOUTH ONCE DAILY (Patient taking differently: Take 5 mg by mouth daily. ) 90 tablet 3  . Blood Glucose Monitoring Suppl (ONE  TOUCH ULTRA 2) w/Device KIT Use to check BS BID-QID Dx:E11.9 1 each 1  . BREO ELLIPTA 100-25 MCG/INH AEPB INHALE 1 PUFF BY MOUTH ONCE DAILY (Patient taking differently: Inhale 1 puff into the lungs daily. ) 60 each 11  . calcitRIOL (ROCALTROL) 0.25 MCG capsule Take 0.25 mcg by mouth daily.    . calcitRIOL (ROCALTROL) 0.5 MCG capsule Take 0.5 mcg by mouth daily.    . carvedilol (COREG) 12.5 MG tablet Take 1 tablet (12.5 mg total) by mouth 2 (two) times daily. 90 tablet 3  . CINNAMON PO Take 350 mg by mouth daily.     . Dulaglutide (TRULICITY) 1.44 YJ/8.5UD SOPN Inject 0.75 mg into the skin every Wednesday. 12 mL 0  . furosemide (LASIX) 20 MG tablet TAKE 1 TABLET BY MOUTH ONCE DAILY 90 tablet 1  . gabapentin (NEURONTIN) 300 MG capsule TAKE 1 CAPSULE BY MOUTH THREE TIMES DAILY (Patient taking differently: Take 300 mg by mouth 3 (three) times daily as needed (for pain/neuropathy in feet/hands.). ) 90 capsule 2  . glucose blood test strip USE AS DIRECTED TO MONITOR FSBS 3X DAILY. DX:E11.65. 200 each 0  . insulin degludec (TRESIBA FLEXTOUCH) 100 UNIT/ML SOPN FlexTouch Pen INJECT 150 UNITS SUBCUTANEOUSLY ONCE DAILY 15 mL 2  . Lancets (ONETOUCH ULTRASOFT) lancets Use as instructed 300 each 3  . polyethylene glycol (MIRALAX / GLYCOLAX) packet Take 17 g by mouth daily as needed for mild constipation.    Marland Kitchen PROAIR HFA 108 (90 Base) MCG/ACT inhaler INHALE TWO PUFFS INTO LUNGS EVERY 6 HOURS AS NEEDED FOR WHEEZING AND FOR SHORTNESS OF BREATH (Patient taking differently: Inhale 2 puffs into the lungs every 6 (six) hours as needed for wheezing or shortness of breath. ) 9 each 4  . rosuvastatin (CRESTOR) 20 MG tablet Take 1 tablet (20 mg total) by mouth daily. (Patient taking differently: Take 20 mg by mouth every evening. ) 90 tablet 3  . sodium bicarbonate 650 MG tablet Take 650 mg by mouth 2 (two) times daily.  12  . TRADJENTA 5 MG TABS tablet TAKE 1 TABLET BY MOUTH ONCE DAILY (Patient taking differently: Take 5 mg  by mouth daily. ) 90 tablet 2  . VITAMIN A PO Take 2,400 Units by mouth daily.     Current Facility-Administered Medications  Medication Dose Route Frequency Provider Last Rate Last Dose  . Bevacizumab (AVASTIN) SOLN 1.25 mg  1.25 mg Intravitreal  [name], MD   1.25 mg at 05/12/18 1319  . Bevacizumab (AVASTIN) SOLN 1.25 mg  1.25 mg Intravitreal  [name], MD   1.25 mg at 06/13/18 0055  . Bevacizumab (AVASTIN) SOLN 1.25 mg  1.25 mg Intravitreal  [name], MD   1.25 mg at 07/10/18 1308  . Bevacizumab (AVASTIN) SOLN 1.25 mg  1.25 mg Intravitreal  [name], MD   1.25 mg at 08/21/18 1312    Allergies:   Penicillins and Adhesive [tape]   Social History:  The patient  reports that she has  never smoked. She has never used smokeless tobacco. She reports current alcohol use. She reports that she does not use drugs.   Family History:  The patient's family history includes Diabetes in her sister; Heart disease in her mother.    ROS:  Please see the history of present illness.   Otherwise, review of systems is positive for chills, fatigue, shortness of breath, palpitations, visual changes, abdominal pain, constipation, nausea, anxiety, back pain, balance problems, dizziness, headaches, easy bruising.   All other systems are reviewed and negative.    PHYSICAL EXAM: VS:  Ht 5' 8"  (1.727 m)   Wt 244 lb (110.7 kg)   BMI 37.10 kg/m  , BMI Body mass index is 37.1 kg/m. GEN: Well nourished, well developed, in no acute distress  HEENT: normal  Neck: no JVD, carotid bruits, or masses Cardiac: RRR; no murmurs, rubs, or gallops,no edema  Respiratory:  clear to auscultation bilaterally, normal work of breathing GI: soft, nontender, nondistended, + BS MS: no deformity or atrophy  Skin: warm and dry Neuro:  Strength and sensation are intact Psych: euthymic mood, full affect  EKG:  EKG is not ordered today. Personal review of the ekg ordered 08/28/18 shows sinus rhythm, septal Q waves,  left anterior fascicular block  Recent Labs: 01/01/2018: Platelets 177 04/02/2018: ALT 7; BUN 57; Creat 3.75 05/14/2018: Hemoglobin 10.2; Potassium 4.3; Sodium 142    Lipid Panel     Component Value Date/Time   CHOL 115 04/02/2018 1229   TRIG 133 04/02/2018 1229   HDL 30 (L) 04/02/2018 1229   CHOLHDL 3.8 04/02/2018 1229   VLDL 42 (H) 12/30/2016 1153   LDLCALC 64 04/02/2018 1229     Wt Readings from Last 3 Encounters:  09/16/18 244 lb (110.7 kg)  08/28/18 242 lb 6.4 oz (110 kg)  07/08/18 246 lb (111.6 kg)      Other studies Reviewed: Additional studies/ records that were reviewed today include: TTE 03/02/18  Review of the above records today demonstrates:  - Left ventricle: The cavity size was normal. Wall thickness was   increased in a pattern of mild LVH. The estimated ejection   fraction was 40%. Diffuse hypokinesis. Doppler parameters are   consistent with abnormal left ventricular relaxation (grade 1   diastolic dysfunction). - Aortic valve: There was no stenosis. - Aorta: Ascending aortic diameter: 37 mm (S). - Ascending aorta: The ascending aorta was borderline dilated. - Mitral valve: Mildly calcified annulus. There was no significant   regurgitation. - Left atrium: The atrium was mildly dilated. - Right ventricle: The cavity size was normal. Systolic function   was normal. - Right atrium: The atrium was mildly dilated. - Pulmonary arteries: No complete TR doppler jet so unable to   estimate PA systolic pressure. - Inferior vena cava: The vessel was normal in size. The   respirophasic diameter changes were in the normal range (>= 50%),   consistent with normal central venous pressure. - Pericardium, extracardiac: A trivial pericardial effusion was   identified posterior to the heart.  SPECT 02/25/18  The left ventricular ejection fraction is moderately decreased (30-44%).  Nuclear stress EF: 43%.  There was no ST segment deviation noted during  stress.  This is a low risk study.   Abnormal, low risk stress nuclear study with no ischemia or infarction.  Gated ejection fraction 43% with diffuse hypokinesis.  However LV function appears better than calculated ejection fraction.  Moderate left ventricular enlargement.  Suggest echocardiogram to further assess.  Monitor 08/20/18 - personally reviewed 1. Sinus rhythm with PVCs 2. Runs of nonsustained ventricular tachycardia  ASSESSMENT AND PLAN:  1.  Nonsustained ventricular tachycardia: It is unclear to me whether or not the patient is symptomatic from this.  She says that her palpitations occur mainly at night and last for minutes.  Her nonsustained VT at the longest was 13 seconds.  Either way, I do feel that she needs to be compliant with her medications.  She has not been compliant with her carvedilol.  We [name] thus stop carvedilol today and start her on Toprol-XL 100 mg.  [name] have her take it at night as she says that it makes her feel weak and fatigued.  Should she continue to have further palpitations, would likely fit her with another monitor.  2.  Chronic systolic heart failure: Patient is currently not on optimal medical therapy and takes Norvasc for her blood pressure.  Going to switch her from carvedilol to Toprol-XL.  [name] am going to start her on Imdur 30 mg and hydralazine 25 mg 3 times a day.  She does have chronic kidney disease and is nearing dialysis.  We [name] avoid ACE inhibitor's and ARB's.    Current medicines are reviewed at length with the patient today.   The patient does not have concerns regarding her medicines.  The following changes were made today:  none  Labs/ tests ordered today include:  No orders of the defined types were placed in this encounter.  Case discussed with primary cardiology  Disposition:   FU with [name] 3 months  Signed, [name] Morss Meredith Leeds, MD  09/16/2018 10:03 AM     [facility] [address] [place] [address] [phone] (office) [phone] (fax)

## 2018-09-23 ENCOUNTER — Encounter: Payer: Self-pay | Admitting: Nephrology

## 2018-09-23 ENCOUNTER — Other Ambulatory Visit (HOSPITAL_COMMUNITY): Payer: Self-pay | Admitting: *Deleted

## 2018-09-23 NOTE — Discharge Instructions (Signed)
Epoetin Alfa injection °What is this medicine? °EPOETIN ALFA (e POE e tin AL fa) helps your body make more red blood cells. This medicine is used to treat anemia caused by chronic kidney disease, cancer chemotherapy, or HIV-therapy. It may also be used before surgery if you have anemia. °This medicine may be used for other purposes; ask your health care provider or pharmacist if you have questions. °COMMON BRAND NAME(S): Epogen, Procrit, Retacrit °What should I tell my health care provider before I take this medicine? °They need to know if you have any of these conditions: °-cancer °-heart disease °-high blood pressure °-history of blood clots °-history of stroke °-low levels of folate, iron, or vitamin B12 in the blood °-seizures °-an unusual or allergic reaction to erythropoietin, albumin, benzyl alcohol, hamster proteins, other medicines, foods, dyes, or preservatives °-pregnant or trying to get pregnant °-breast-feeding °How should I use this medicine? °This medicine is for injection into a vein or under the skin. It is usually given by a health care professional in a hospital or clinic setting. °If you get this medicine at home, you will be taught how to prepare and give this medicine. Use exactly as directed. Take your medicine at regular intervals. Do not take your medicine more often than directed. °It is important that you put your used needles and syringes in a special sharps container. Do not put them in a trash can. If you do not have a sharps container, call your pharmacist or healthcare provider to get one. °A special MedGuide will be given to you by the pharmacist with each prescription and refill. Be sure to read this information carefully each time. °Talk to your pediatrician regarding the use of this medicine in children. While this drug may be prescribed for selected conditions, precautions do apply. °Overdosage: If you think you have taken too much of this medicine contact a poison control center  or emergency room at once. °NOTE: This medicine is only for you. Do not share this medicine with others. °What if I miss a dose? °If you miss a dose, take it as soon as you can. If it is almost time for your next dose, take only that dose. Do not take double or extra doses. °What may interact with this medicine? °Interactions have not been studied. °This list may not describe all possible interactions. Give your health care provider a list of all the medicines, herbs, non-prescription drugs, or dietary supplements you use. Also tell them if you smoke, drink alcohol, or use illegal drugs. Some items may interact with your medicine. °What should I watch for while using this medicine? °Your condition will be monitored carefully while you are receiving this medicine. °You may need blood work done while you are taking this medicine. °This medicine may cause a decrease in vitamin B6. You should make sure that you get enough vitamin B6 while you are taking this medicine. Discuss the foods you eat and the vitamins you take with your health care professional. °What side effects may I notice from receiving this medicine? °Side effects that you should report to your doctor or health care professional as soon as possible: °-allergic reactions like skin rash, itching or hives, swelling of the face, lips, or tongue °-seizures °-signs and symptoms of a blood clot such as breathing problems; changes in vision; chest pain; severe, sudden headache; pain, swelling, warmth in the leg; trouble speaking; sudden numbness or weakness of the face, arm or leg °-signs and symptoms of a stroke   like changes in vision; confusion; trouble speaking or understanding; severe headaches; sudden numbness or weakness of the face, arm or leg; trouble walking; dizziness; loss of balance or coordination °Side effects that usually do not require medical attention (report to your doctor or health care professional if they continue or are  bothersome): °-chills °-cough °-dizziness °-fever °-headaches °-joint pain °-muscle cramps °-muscle pain °-nausea, vomiting °-pain, redness, or irritation at site where injected °This list may not describe all possible side effects. Call your doctor for medical advice about side effects. You may report side effects to FDA at 1-800-FDA-1088. °Where should I keep my medicine? °Keep out of the reach of children. °Store in a refrigerator between 2 and 8 degrees C (36 and 46 degrees F). Do not freeze or shake. Throw away any unused portion if using a single-dose vial. Multi-dose vials can be kept in the refrigerator for up to 21 days after the initial dose. Throw away unused medicine. °NOTE: This sheet is a summary. It may not cover all possible information. If you have questions about this medicine, talk to your doctor, pharmacist, or health care provider. °© 2019 [employer] (2017-02-14 08:35:19) ° °

## 2018-09-24 ENCOUNTER — Encounter (HOSPITAL_COMMUNITY)
Admission: RE | Admit: 2018-09-24 | Discharge: 2018-09-24 | Disposition: A | Discharge status: Home / Self Care | Payer: Medicare Other | Source: Ambulatory Visit | Attending: Nephrology | Admitting: Nephrology

## 2018-09-24 DIAGNOSIS — D631 Anemia in chronic kidney disease: Secondary | ICD-10-CM | POA: Diagnosis not present

## 2018-09-24 DIAGNOSIS — N184 Chronic kidney disease, stage 4 (severe): Secondary | ICD-10-CM | POA: Diagnosis not present

## 2018-09-24 LAB — POCT HEMOGLOBIN-HEMACUE: Hemoglobin: 9.7 g/dL — ABNORMAL LOW (ref 12.0–15.0)

## 2018-09-24 MED ORDER — EPOETIN ALFA-EPBX 3000 UNIT/ML IJ SOLN
3000.0000 [IU] | Freq: Once | INTRAMUSCULAR | Status: DC
  Administered 2018-09-24: 3000 [IU] via SUBCUTANEOUS
  Filled 2018-09-24: qty 1

## 2018-09-24 MED ORDER — EPOETIN ALFA-EPBX 2000 UNIT/ML IJ SOLN
2000.0000 [IU] | Freq: Once | INTRAMUSCULAR | Status: DC
  Administered 2018-09-24: 2000 [IU] via SUBCUTANEOUS
  Filled 2018-09-24: qty 1

## 2018-09-29 ENCOUNTER — Encounter: Payer: Self-pay | Admitting: Nephrology

## 2018-09-29 DIAGNOSIS — N189 Chronic kidney disease, unspecified: Secondary | ICD-10-CM

## 2018-09-29 DIAGNOSIS — D631 Anemia in chronic kidney disease: Secondary | ICD-10-CM | POA: Insufficient documentation

## 2018-09-29 HISTORY — DX: Chronic kidney disease, unspecified: D63.1

## 2018-09-30 ENCOUNTER — Ambulatory Visit (HOSPITAL_COMMUNITY)
Admission: RE | Admit: 2018-09-30 | Discharge: 2018-09-30 | Disposition: A | Discharge status: Home / Self Care | Payer: Medicare Other | Source: Ambulatory Visit | Attending: Nephrology | Admitting: Nephrology

## 2018-09-30 ENCOUNTER — Other Ambulatory Visit: Payer: Self-pay

## 2018-09-30 DIAGNOSIS — D631 Anemia in chronic kidney disease: Secondary | ICD-10-CM | POA: Diagnosis not present

## 2018-09-30 DIAGNOSIS — N184 Chronic kidney disease, stage 4 (severe): Secondary | ICD-10-CM | POA: Insufficient documentation

## 2018-09-30 LAB — POCT HEMOGLOBIN-HEMACUE: Hemoglobin: 9.1 g/dL — ABNORMAL LOW (ref 12.0–15.0)

## 2018-09-30 MED ORDER — EPOETIN ALFA-EPBX 2000 UNIT/ML IJ SOLN
2000.0000 [IU] | Freq: Once | INTRAMUSCULAR | Status: AC
  Administered 2018-09-30: 2000 [IU] via SUBCUTANEOUS
  Filled 2018-09-30: qty 1

## 2018-09-30 MED ORDER — EPOETIN ALFA-EPBX 3000 UNIT/ML IJ SOLN
3000.0000 [IU] | Freq: Once | INTRAMUSCULAR | Status: AC
  Administered 2018-09-30: 3000 [IU] via SUBCUTANEOUS
  Filled 2018-09-30: qty 1

## 2018-09-30 NOTE — Progress Notes (Signed)
[facility] Note  10/02/2018     CHIEF COMPLAINT Patient presents for Retina Evaluation   HISTORY OF PRESENT ILLNESS: [name] is a 68 y.o. female who presents to the clinic today for:   HPI    Retina Evaluation    In both eyes.  This started months ago.  Duration of months.  Associated Symptoms Floaters.  Response to treatment was no improvement.  I, the attending physician,  performed the HPI with the patient and updated documentation appropriately.          Comments    6 week NPDR eval OU.  Patient states her vision is the same.  Patient denies eye pain or discomfort.  Patient states she is noticing floater in the left eye more.  Denies any change OD.       Last edited by [name], MD on 10/02/2018  3:03 PM. (History)    pt states she's doing okay, she states she thought the injections were helping her, but that spot in left eye has come back, she states she also has really bad allergies right now, she states her eyes itch a lot, pts next appt with Dr. [name] is April 9th  Referring physician: [name], MD [address] [facility] [place], [place] [address]  HISTORICAL INFORMATION:   Selected notes from the MEDICAL RECORD NUMBER Referred by Dr. [name] for concern of BRVO OS LEE: 10.03.19 (C. Weaver) [BCVA: OD: 20/30 OS: 20/50] Ocular Hx-cataracts OU, HTR OU, NPDR OU, DES PMH-DM (U0A: 7.3, takes Trulicity and [place]), HTN, HLD     CURRENT MEDICATIONS: No current outpatient medications on file. (Ophthalmic Drugs)   No current facility-administered medications for this visit.  (Ophthalmic Drugs)   Current Outpatient Medications (Other)  Medication Sig  . Blood Glucose Monitoring Suppl (ONE TOUCH ULTRA 2) w/Device KIT Use to check BS BID-QID Dx:E11.9  . BREO ELLIPTA 100-25 MCG/INH AEPB INHALE 1 PUFF BY MOUTH ONCE DAILY (Patient taking differently: Inhale 1 puff into the lungs daily. )  . calcitRIOL (ROCALTROL) 0.25 MCG  capsule Take 0.25 mcg by mouth daily.  . calcitRIOL (ROCALTROL) 0.5 MCG capsule Take 0.5 mcg by mouth daily.  Marland Kitchen CINNAMON PO Take 350 mg by mouth daily.   . Dulaglutide (TRULICITY) 5.40 JW/1.1BJ SOPN Inject 0.75 mg into the skin every Wednesday.  . furosemide (LASIX) 20 MG tablet TAKE 1 TABLET BY MOUTH ONCE DAILY  . gabapentin (NEURONTIN) 300 MG capsule TAKE 1 CAPSULE BY MOUTH THREE TIMES DAILY (Patient taking differently: Take 300 mg by mouth 3 (three) times daily as needed (for pain/neuropathy in feet/hands.). )  . glucose blood test strip USE AS DIRECTED TO MONITOR FSBS 3X DAILY. DX:E11.65.  . hydrALAZINE (APRESOLINE) 25 MG tablet Take 1 tablet (25 mg total) by mouth 3 (three) times daily.  . isosorbide mononitrate (IMDUR) 30 MG 24 hr tablet Take 1 tablet (30 mg total) by mouth daily.  . Lancets (ONETOUCH ULTRASOFT) lancets Use as instructed  . metoprolol succinate (TOPROL-XL) 100 MG 24 hr tablet Take 1 tablet (100 mg total) by mouth at bedtime. Take with or immediately following a meal.  . polyethylene glycol (MIRALAX / GLYCOLAX) packet Take 17 g by mouth daily as needed for mild constipation.  Marland Kitchen PROAIR HFA 108 (90 Base) MCG/ACT inhaler INHALE TWO PUFFS INTO LUNGS EVERY 6 HOURS AS NEEDED FOR WHEEZING AND FOR SHORTNESS OF BREATH (Patient taking differently: Inhale 2 puffs into the lungs every  6 (six) hours as needed for wheezing or shortness of breath. )  . rosuvastatin (CRESTOR) 20 MG tablet Take 1 tablet (20 mg total) by mouth daily. (Patient taking differently: Take 20 mg by mouth every evening. )  . sodium bicarbonate 650 MG tablet Take 650 mg by mouth 2 (two) times daily.  . TRADJENTA 5 MG TABS tablet TAKE 1 TABLET BY MOUTH ONCE DAILY (Patient taking differently: Take 5 mg by mouth daily. )  . TRESIBA FLEXTOUCH 100 UNIT/ML SOPN FlexTouch Pen INJECT 150 UNITS SUBCUTANEOUSLY ONCE DAILY  . VITAMIN A PO Take 2,400 Units by mouth daily.   Current Facility-Administered Medications (Other)   Medication Route  . Bevacizumab (AVASTIN) SOLN 1.25 mg Intravitreal  . Bevacizumab (AVASTIN) SOLN 1.25 mg Intravitreal  . Bevacizumab (AVASTIN) SOLN 1.25 mg Intravitreal  . Bevacizumab (AVASTIN) SOLN 1.25 mg Intravitreal  . Bevacizumab (AVASTIN) SOLN 1.25 mg Intravitreal      REVIEW OF SYSTEMS: ROS    Positive for: Endocrine, Eyes   Negative for: Constitutional, Gastrointestinal, Neurological, Skin, Genitourinary, Musculoskeletal, HENT, Cardiovascular, Respiratory, Psychiatric, Allergic/Imm, Heme/Lymph   Last edited by Doneen Poisson on 10/02/2018  1:51 PM. (History)       ALLERGIES Allergies  Allergen Reactions  . Penicillins Other (See Comments)    UNSPECIFIED CHILDHOOD REACTION  Has patient had a PCN reaction causing immediate rash, facial/tongue/throat swelling, SOB or lightheadedness with hypotension: Unknown Has patient had a PCN reaction causing severe rash involving mucus membranes or skin necrosis: Unknown Has patient had a PCN reaction that required hospitalization: Unknown Has patient had a PCN reaction occurring within the last 10 years: Unknown If all of the above answers are "NO", then may proceed with Cephalosporin use.   . Adhesive [Tape] Rash    PAST MEDICAL HISTORY Past Medical History:  Diagnosis Date  . Anemia   . Anemia associated with chronic renal failure   . Anemia in chronic kidney disease 09/29/2018  . Arthritis   . Asthma   . CKD stage 3 due to type 2 diabetes mellitus ([place])   . Coronary artery calcification seen on CAT scan 02/17/2018   Coronary calcification on CT  . Depression   . Diabetic retinopathy of both eyes ([place])   . Diabetic ulcer of right foot associated with type 2 diabetes mellitus ([place])   . Essential hypertension 02/17/2018   Essential hypertension  . GERD (gastroesophageal reflux disease)    pt denies  . HLD (hyperlipidemia)   . Hypertension   . Left ventricular dysfunction 04/07/2018   Left ventricle dysfunction  .  Microalbuminuria due to type 2 diabetes mellitus ([place])   . Neuromuscular disorder ([place])    diabetic neuropathy  . Nonproliferative retinopathy due to secondary diabetes ([place])   . Nonsustained ventricular tachycardia ([place]) 08/28/2018   Nonsustained ventricular tachycardia  . Pneumonia    hx of   . Renal cell cancer ([place])   . Right renal mass 03/10/2017  . Type 2 diabetes, controlled, with neuropathy ([place]) 06/22/2013  . Ulcer of other part of foot 06/22/2013  . 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 BASCILIC VEIN TRANSPOSITION Left 08/08/2017   Procedure: BASILIC VEIN TRANSPOSITION FIRST STAGE LEFT ARM;  Surgeon: Serafina Mitchell, MD;  Location: [facility];  Service: Vascular;  Laterality: Left;  . BASCILIC VEIN TRANSPOSITION Left 05/14/2018   Procedure: SECOND STAGE BASILIC VEIN TRANSPOSITION LEFT ARM;  Surgeon: Serafina Mitchell, MD;  Location: [facility];  Service: Vascular;  Laterality: Left;  . ROBOTIC ADRENALECTOMY Left 03/10/2017   Procedure: XI ROBOTIC ADRENALECTOMY;  Surgeon: Nickie Retort, MD;  Location: [facility];  Service: Urology;  Laterality: Left;  . ROBOTIC ASSITED PARTIAL NEPHRECTOMY Right 03/10/2017   Procedure: XI ROBOTIC ASSITED RADICAL NEPHRECTOMY;  Surgeon: Nickie Retort, MD;  Location: [facility];  Service: Urology;  Laterality: Right;  . TOE AMPUTATION Bilateral    both great toe ,, left foot 2nd toe 1/2    FAMILY HISTORY Family History  Problem Relation Age of Onset  . Heart disease Mother   . Diabetes Sister     SOCIAL HISTORY Social History   Tobacco Use  . Smoking status: Never Smoker  . Smokeless tobacco: Never Used  Substance Use Topics  . Alcohol use: Yes    Alcohol/week: 0.0 standard drinks    Comment: occ  . Drug use: No         OPHTHALMIC EXAM:  Base Eye Exam    Visual Acuity (Snellen - Linear)      Right Left   Dist [place] 20/40 -1 20/150 -2   Dist ph [place] NI 20/50    Correction:  Glasses       Tonometry (Tonopen, 1:53 PM)      Right Left   Pressure 12 13       Pupils      Pupils Dark Light Shape React APD   Right PERRL 3 2 Round Minimal 0   Left PERRL 3 2 Round Minimal 0       Visual Fields      Left Right    Full Full       Extraocular Movement      Right Left    Full Full       Neuro/Psych    Oriented x3:  Yes   Mood/Affect:  Normal       Dilation    Both eyes:  1.0% Mydriacyl, 2.5% Phenylephrine @ 1:53 PM        Slit Lamp and Fundus Exam    Slit Lamp Exam      Right Left   Lids/Lashes Dermatochalasis - upper lid, mild MGD Dermatochalasis - upper lid, Telangiectasia, mild MGD   Conjunctiva/Sclera White and quiet White and quiet   Cornea 1+ Punctate epithelial erosions, mild Arcus 1+ Punctate epithelial erosions   Anterior Chamber Deep and quiet Deep and quiet   Iris Round and dilated, No NVI Round and well dilated   Lens 2+ Nuclear sclerosis, 2-3+ Cortical cataract 2-3+ Nuclear sclerosis, early brunescense, 2-3+ Cortical cataract, 1-2+ Posterior subcapsular cataract   Vitreous Vitreous syneresis, Posterior vitreous detachment Vitreous syneresis       Fundus Exam      Right Left   Disc Pink and Sharp Pink and Sharp   C/D Ratio 0.3 0.2   Macula Blunted foveal reflex, scattered MA and IRH, cotton wool spot IN macula w/surrounding blot heme - improved Blunted foveal reflex, scattered DBH, small cluster of DBH and CWS IN macula - mildly improved.   Vessels Vascular attenuation, Tortuous Tortuous, Vascular attenuation   Periphery Attached, scattered DBH and small CWS nasal to disc Attached, scattered MA/DBH          IMAGING AND PROCEDURES  Imaging and Procedures for _0 @  OCT, Retina - OU - Both Eyes       Right Eye Quality was good. Central Foveal Thickness: 249. Progression has been stable. Findings include normal  foveal contour, no SRF, no IRF (Interval improvement in IRF, inner-retinal thickening along proximal  IT arcades.).   Left Eye Quality was borderline. Central Foveal Thickness: 252. Progression has been stable. Findings include normal foveal contour, intraretinal fluid, no SRF, vitreomacular adhesion , intraretinal hyper-reflective material (Peristent IRF/IRHM IN macula).   Notes *Images captured and stored on drive  Diagnosis / Impression:  NFP; +IRF; no SRF OU noncentral DME OU (OS > OD) OD - Interval improvement in IRF, inner-retinal thickening along proximal IT arcades. OS - Persistent IRF/IRHM inf nasal macula  Clinical management:  See below  Abbreviations: NFP - Normal foveal profile. CME - cystoid macular edema. PED - pigment epithelial detachment. IRF - intraretinal fluid. SRF - subretinal fluid. EZ - ellipsoid zone. ERM - epiretinal membrane. ORA - outer retinal atrophy. ORT - outer retinal tubulation. SRHM - subretinal hyper-reflective material        Intravitreal Injection, Pharmacologic Agent - OS - Left Eye       Time Out 10/02/2018. 3:00 PM. Confirmed correct patient, procedure, site, and patient consented.   Anesthesia Topical anesthesia was used. Anesthetic medications included Lidocaine 2%, Proparacaine 0.5%.   Procedure Preparation included 5% betadine to ocular surface, eyelid speculum. A supplied needle was used.   Injection:  1.25 mg Bevacizumab (AVASTIN) SOLN   NDC: 62831-517-61, Lot: [id number] , Expiration date: 11/18/2018   Route: Intravitreal, Site: Left Eye, Waste: 0 mL  Post-op Post injection exam found visual acuity of at least counting fingers. The patient tolerated the procedure well. There were no complications. The patient received written and verbal post procedure care education.                 ASSESSMENT/PLAN:    ICD-10-CM   1. Moderate nonproliferative diabetic retinopathy of both eyes with macular edema associated with type 2 diabetes mellitus (HCC) Y07.3710 Intravitreal Injection, Pharmacologic Agent - OS - Left Eye     Bevacizumab (AVASTIN) SOLN 1.25 mg  2. Retinal edema H35.81 OCT, Retina - OU - Both Eyes  3. Essential hypertension I10   4. Hypertensive retinopathy of both eyes H35.033   5. Combined forms of age-related cataract of both eyes H25.813     1,2 Moderate non-proliferative diabetic retinopathy w/ DME OU (OS > OD) - Initial exam shows scattered MA, BCVA 20/50 OS - Initial FA (05/12/18) shows leaking MA, no NV OS - Initial OCT showed diabetic macular edema OU (OS > OD)  - s/p IVA #1 OS (10.22.19), #2 (11.20.19), #3 (12.20.19), #4 (01.31.20) - Today BCVA 20/50 OS.   - OCT w/ persistent IRF/IRHM OS. - recommend IVA #5 OS today, 03.13.20 - pt wishes to proceed - RBA of procedure discussed, questions answered - informed consent obtained and signed - see procedure note - f/u in 6 wks  3,4. Hypertensive retinopathy OU - discussed importance of tight BP control - monitor  5. Combined Form Cataracts OU (OS > OD) - The symptoms of cataract, surgical options, and treatments and risks were discussed with patient. - discussed diagnosis and progression - cataract OS likely visually significant, OD borderline - under the expert management of Dr. [name] - clear from a retina standpoint to proceed with cataract surgery when pt and surgeon are ready - recommend coordinating care so that pt receives anti-VEGF 1-2 wks prior to cataract surgery    Ophthalmic Meds Ordered this visit:  Meds ordered this encounter  Medications  . Bevacizumab (AVASTIN) SOLN 1.25 mg       Return in  about 6 weeks (around 11/13/2018) for f/u NPDR OU, DFE, OCT.  There are no Patient Instructions on file for this visit.   Explained the diagnoses, plan, and follow up with the patient and they expressed understanding.  Patient expressed understanding of the importance of proper follow up care.   This document serves as a record of services personally performed by Gardiner Sleeper, MD, PhD. It was created on their behalf  by Ernest Mallick, OA, an ophthalmic assistant. The creation of this record is the provider's dictation and/or activities during the visit.    Electronically signed by: Ernest Mallick, OA  03.11.2020 3:11 PM    Gardiner Sleeper, M.D., Ph.D. Diseases & Surgery of the Retina and Vitreous [facility]   I have reviewed the above documentation for accuracy and completeness, and I agree with the above. Gardiner Sleeper, M.D., Ph.D. 10/02/18 3:12 PM    Abbreviations: M myopia (nearsighted); A astigmatism; H hyperopia (farsighted); P presbyopia; Mrx spectacle prescription;  CTL contact lenses; OD right eye; OS left eye; OU both eyes  XT exotropia; ET esotropia; PEK punctate epithelial keratitis; PEE punctate epithelial erosions; DES dry eye syndrome; MGD meibomian gland dysfunction; ATs artificial tears; PFAT's preservative free artificial tears; [place] nuclear sclerotic cataract; PSC posterior subcapsular cataract; ERM epi-retinal membrane; PVD posterior vitreous detachment; RD retinal detachment; DM diabetes mellitus; DR diabetic retinopathy; NPDR non-proliferative diabetic retinopathy; PDR proliferative diabetic retinopathy; CSME clinically significant macular edema; DME diabetic macular edema; dbh dot blot hemorrhages; CWS cotton wool spot; POAG primary open angle glaucoma; C/D cup-to-disc ratio; HVF humphrey visual field; GVF goldmann visual field; OCT optical coherence tomography; IOP intraocular pressure; BRVO Branch retinal vein occlusion; CRVO central retinal vein occlusion; CRAO central retinal artery occlusion; BRAO branch retinal artery occlusion; RT retinal tear; SB scleral buckle; PPV pars plana vitrectomy; VH Vitreous hemorrhage; PRP panretinal laser photocoagulation; IVK intravitreal kenalog; VMT vitreomacular traction; MH Macular hole;  NVD neovascularization of the disc; NVE neovascularization elsewhere; AREDS age related eye disease study; ARMD age related macular degeneration;  POAG primary open angle glaucoma; EBMD epithelial/anterior basement membrane dystrophy; ACIOL anterior chamber intraocular lens; IOL intraocular lens; PCIOL posterior chamber intraocular lens; Phaco/IOL phacoemulsification with intraocular lens placement; [place] photorefractive keratectomy; LASIK laser assisted in situ keratomileusis; HTN hypertension; DM diabetes mellitus; COPD chronic obstructive pulmonary disease

## 2018-10-01 ENCOUNTER — Other Ambulatory Visit: Payer: Self-pay

## 2018-10-01 ENCOUNTER — Other Ambulatory Visit: Payer: Medicare Other

## 2018-10-01 ENCOUNTER — Encounter (HOSPITAL_COMMUNITY): Payer: Medicare Other

## 2018-10-01 DIAGNOSIS — N185 Chronic kidney disease, stage 5: Secondary | ICD-10-CM | POA: Diagnosis not present

## 2018-10-01 DIAGNOSIS — E119 Type 2 diabetes mellitus without complications: Secondary | ICD-10-CM

## 2018-10-01 DIAGNOSIS — I10 Essential (primary) hypertension: Secondary | ICD-10-CM | POA: Diagnosis not present

## 2018-10-01 DIAGNOSIS — E785 Hyperlipidemia, unspecified: Secondary | ICD-10-CM

## 2018-10-01 DIAGNOSIS — Z794 Long term (current) use of insulin: Principal | ICD-10-CM

## 2018-10-02 ENCOUNTER — Encounter (INDEPENDENT_AMBULATORY_CARE_PROVIDER_SITE_OTHER): Payer: Self-pay | Admitting: Ophthalmology

## 2018-10-02 ENCOUNTER — Ambulatory Visit (INDEPENDENT_AMBULATORY_CARE_PROVIDER_SITE_OTHER): Payer: Medicare Other | Admitting: Ophthalmology

## 2018-10-02 DIAGNOSIS — H35033 Hypertensive retinopathy, bilateral: Secondary | ICD-10-CM

## 2018-10-02 DIAGNOSIS — E113313 Type 2 diabetes mellitus with moderate nonproliferative diabetic retinopathy with macular edema, bilateral: Secondary | ICD-10-CM

## 2018-10-02 DIAGNOSIS — I10 Essential (primary) hypertension: Secondary | ICD-10-CM

## 2018-10-02 DIAGNOSIS — H25813 Combined forms of age-related cataract, bilateral: Secondary | ICD-10-CM

## 2018-10-02 DIAGNOSIS — H3581 Retinal edema: Secondary | ICD-10-CM | POA: Diagnosis not present

## 2018-10-02 LAB — COMPREHENSIVE METABOLIC PANEL
AG Ratio: 1.4 (calc) (ref 1.0–2.5)
ALT: 7 U/L (ref 6–29)
AST: 11 U/L (ref 10–35)
Albumin: 3.6 g/dL (ref 3.6–5.1)
Alkaline phosphatase (APISO): 71 U/L (ref 37–153)
BUN/Creatinine Ratio: 14 (calc) (ref 6–22)
BUN: 62 mg/dL — ABNORMAL HIGH (ref 7–25)
CO2: 23 mmol/L (ref 20–32)
Calcium: 8.2 mg/dL — ABNORMAL LOW (ref 8.6–10.4)
Chloride: 111 mmol/L — ABNORMAL HIGH (ref 98–110)
Creat: 4.4 mg/dL — ABNORMAL HIGH (ref 0.50–0.99)
Globulin: 2.6 g/dL (calc) (ref 1.9–3.7)
Glucose, Bld: 60 mg/dL — ABNORMAL LOW (ref 65–99)
Potassium: 5.4 mmol/L — ABNORMAL HIGH (ref 3.5–5.3)
Sodium: 145 mmol/L (ref 135–146)
Total Bilirubin: 0.3 mg/dL (ref 0.2–1.2)
Total Protein: 6.2 g/dL (ref 6.1–8.1)

## 2018-10-02 LAB — CBC WITH DIFFERENTIAL/PLATELET
Absolute Monocytes: 397 cells/uL (ref 200–950)
Basophils Absolute: 38 cells/uL (ref 0–200)
Basophils Relative: 0.6 %
Eosinophils Absolute: 147 cells/uL (ref 15–500)
Eosinophils Relative: 2.3 %
HCT: 28.7 % — ABNORMAL LOW (ref 35.0–45.0)
Hemoglobin: 9.5 g/dL — ABNORMAL LOW (ref 11.7–15.5)
Lymphs Abs: 1235 cells/uL (ref 850–3900)
MCH: 29.7 pg (ref 27.0–33.0)
MCHC: 33.1 g/dL (ref 32.0–36.0)
MCV: 89.7 fL (ref 80.0–100.0)
MPV: 10.8 fL (ref 7.5–12.5)
Monocytes Relative: 6.2 %
Neutro Abs: 4582 cells/uL (ref 1500–7800)
Neutrophils Relative %: 71.6 %
Platelets: 170 10*3/uL (ref 140–400)
RBC: 3.2 10*6/uL — ABNORMAL LOW (ref 3.80–5.10)
RDW: 13.3 % (ref 11.0–15.0)
Total Lymphocyte: 19.3 %
WBC: 6.4 10*3/uL (ref 3.8–10.8)

## 2018-10-02 LAB — HEMOGLOBIN A1C
Hgb A1c MFr Bld: 7.1 % of total Hgb — ABNORMAL HIGH (ref <5.7)
Mean Plasma Glucose: 157 (calc)
eAG (mmol/L): 8.7 (calc)

## 2018-10-02 LAB — LIPID PANEL
Cholesterol: 133 mg/dL (ref <200)
HDL: 33 mg/dL — ABNORMAL LOW (ref >=50)
LDL Cholesterol (Calc): 77 mg/dL (calc)
Non-HDL Cholesterol (Calc): 100 mg/dL (calc) (ref <130)
Total CHOL/HDL Ratio: 4 (calc) (ref <5.0)
Triglycerides: 133 mg/dL (ref <150)

## 2018-10-02 MED ORDER — BEVACIZUMAB CHEMO INJECTION 1.25MG/0.05ML SYRINGE FOR KALEIDOSCOPE
1.2500 mg | INTRAVITREAL | Status: DC
  Administered 2018-10-02: 1.25 mg via INTRAVITREAL

## 2018-10-05 ENCOUNTER — Encounter (INDEPENDENT_AMBULATORY_CARE_PROVIDER_SITE_OTHER): Payer: Medicare Other | Admitting: Ophthalmology

## 2018-10-06 ENCOUNTER — Other Ambulatory Visit: Payer: Self-pay

## 2018-10-07 ENCOUNTER — Inpatient Hospital Stay (HOSPITAL_COMMUNITY): Admission: RE | Admit: 2018-10-07 | Payer: Medicare Other | Source: Ambulatory Visit

## 2018-10-09 ENCOUNTER — Other Ambulatory Visit: Payer: Self-pay

## 2018-10-09 ENCOUNTER — Ambulatory Visit (HOSPITAL_COMMUNITY)
Admission: RE | Admit: 2018-10-09 | Discharge: 2018-10-09 | Disposition: A | Discharge status: Home / Self Care | Payer: Medicare Other | Source: Ambulatory Visit | Attending: Nephrology | Admitting: Nephrology

## 2018-10-09 ENCOUNTER — Ambulatory Visit: Payer: Medicare Other | Admitting: Family Medicine

## 2018-10-09 VITALS — BP 120/58 | HR 68 | Temp 98.3°F | Resp 20

## 2018-10-09 DIAGNOSIS — D631 Anemia in chronic kidney disease: Secondary | ICD-10-CM | POA: Diagnosis not present

## 2018-10-09 DIAGNOSIS — N184 Chronic kidney disease, stage 4 (severe): Secondary | ICD-10-CM | POA: Diagnosis not present

## 2018-10-09 LAB — POCT HEMOGLOBIN-HEMACUE: Hemoglobin: 9.4 g/dL — ABNORMAL LOW (ref 12.0–15.0)

## 2018-10-09 MED ORDER — EPOETIN ALFA-EPBX 2000 UNIT/ML IJ SOLN
2000.0000 [IU] | INTRAMUSCULAR | Status: DC
  Administered 2018-10-09: 2000 [IU] via SUBCUTANEOUS
  Filled 2018-10-09: qty 1

## 2018-10-09 MED ORDER — EPOETIN ALFA-EPBX 3000 UNIT/ML IJ SOLN
3000.0000 [IU] | INTRAMUSCULAR | Status: DC
  Administered 2018-10-09: 3000 [IU] via SUBCUTANEOUS
  Filled 2018-10-09: qty 1

## 2018-10-14 ENCOUNTER — Encounter (HOSPITAL_COMMUNITY): Payer: Medicare Other

## 2018-10-16 ENCOUNTER — Other Ambulatory Visit: Payer: Self-pay

## 2018-10-16 ENCOUNTER — Encounter (HOSPITAL_COMMUNITY)
Admission: RE | Admit: 2018-10-16 | Discharge: 2018-10-16 | Disposition: A | Discharge status: Home / Self Care | Payer: Medicare Other | Source: Ambulatory Visit | Attending: Nephrology | Admitting: Nephrology

## 2018-10-16 VITALS — BP 121/64 | HR 70 | Temp 98.3°F | Resp 18

## 2018-10-16 DIAGNOSIS — N184 Chronic kidney disease, stage 4 (severe): Secondary | ICD-10-CM | POA: Diagnosis not present

## 2018-10-16 DIAGNOSIS — D631 Anemia in chronic kidney disease: Secondary | ICD-10-CM

## 2018-10-16 MED ORDER — EPOETIN ALFA-EPBX 2000 UNIT/ML IJ SOLN
2000.0000 [IU] | INTRAMUSCULAR | Status: DC
  Administered 2018-10-16: 2000 [IU] via SUBCUTANEOUS
  Filled 2018-10-16: qty 1

## 2018-10-16 MED ORDER — EPOETIN ALFA-EPBX 3000 UNIT/ML IJ SOLN
3000.0000 [IU] | INTRAMUSCULAR | Status: DC
  Administered 2018-10-16: 3000 [IU] via SUBCUTANEOUS
  Filled 2018-10-16: qty 1

## 2018-10-18 ENCOUNTER — Other Ambulatory Visit: Payer: Self-pay | Admitting: Family Medicine

## 2018-10-19 LAB — POCT HEMOGLOBIN-HEMACUE: Hemoglobin: 9.5 g/dL — ABNORMAL LOW (ref 12.0–15.0)

## 2018-10-22 ENCOUNTER — Ambulatory Visit: Payer: Medicare Other | Admitting: Family Medicine

## 2018-10-22 ENCOUNTER — Other Ambulatory Visit: Payer: Self-pay

## 2018-10-23 ENCOUNTER — Ambulatory Visit (HOSPITAL_COMMUNITY)
Admission: RE | Admit: 2018-10-23 | Discharge: 2018-10-23 | Disposition: A | Discharge status: Home / Self Care | Payer: Medicare Other | Source: Ambulatory Visit | Attending: Nephrology | Admitting: Nephrology

## 2018-10-23 VITALS — BP 125/65 | HR 63 | Temp 97.8°F

## 2018-10-23 DIAGNOSIS — D631 Anemia in chronic kidney disease: Secondary | ICD-10-CM | POA: Diagnosis not present

## 2018-10-23 DIAGNOSIS — N184 Chronic kidney disease, stage 4 (severe): Secondary | ICD-10-CM | POA: Diagnosis not present

## 2018-10-23 LAB — FERRITIN: Ferritin: 53 ng/mL (ref 11–307)

## 2018-10-23 LAB — IRON AND TIBC
Iron: 55 ug/dL (ref 28–170)
Saturation Ratios: 22 % (ref 10.4–31.8)
TIBC: 246 ug/dL — ABNORMAL LOW (ref 250–450)
UIBC: 191 ug/dL

## 2018-10-23 LAB — POCT HEMOGLOBIN-HEMACUE: Hemoglobin: 9.7 g/dL — ABNORMAL LOW (ref 12.0–15.0)

## 2018-10-23 MED ORDER — EPOETIN ALFA-EPBX 10000 UNIT/ML IJ SOLN
10000.0000 [IU] | Freq: Once | INTRAMUSCULAR | Status: AC
  Administered 2018-10-23: 10000 [IU] via SUBCUTANEOUS
  Filled 2018-10-23: qty 1

## 2018-10-23 MED ORDER — EPOETIN ALFA-EPBX 2000 UNIT/ML IJ SOLN: 2000.0000 [IU] | INTRAMUSCULAR | Status: DC

## 2018-10-23 MED ORDER — EPOETIN ALFA-EPBX 3000 UNIT/ML IJ SOLN: 3000.0000 [IU] | INTRAMUSCULAR | Status: DC

## 2018-10-29 ENCOUNTER — Other Ambulatory Visit: Payer: Self-pay | Admitting: Family Medicine

## 2018-10-29 NOTE — Telephone Encounter (Signed)
Requested Prescriptions   Pending Prescriptions Disp Refills  . gabapentin (NEURONTIN) 300 MG capsule [Pharmacy Med Name: Gabapentin 300 MG Oral Capsule] 90 capsule 0    Sig: TAKE 1 CAPSULE BY MOUTH THREE TIMES DAILY   Last OV 04/10/2018  Last Rx written 03/05/2018

## 2018-11-02 ENCOUNTER — Ambulatory Visit (INDEPENDENT_AMBULATORY_CARE_PROVIDER_SITE_OTHER): Payer: Medicare Other | Admitting: Family Medicine

## 2018-11-02 ENCOUNTER — Other Ambulatory Visit: Payer: Self-pay

## 2018-11-02 DIAGNOSIS — I120 Hypertensive chronic kidney disease with stage 5 chronic kidney disease or end stage renal disease: Secondary | ICD-10-CM | POA: Diagnosis not present

## 2018-11-02 DIAGNOSIS — I10 Essential (primary) hypertension: Secondary | ICD-10-CM

## 2018-11-02 DIAGNOSIS — E1122 Type 2 diabetes mellitus with diabetic chronic kidney disease: Secondary | ICD-10-CM | POA: Diagnosis not present

## 2018-11-02 DIAGNOSIS — Z794 Long term (current) use of insulin: Secondary | ICD-10-CM | POA: Diagnosis not present

## 2018-11-02 DIAGNOSIS — N185 Chronic kidney disease, stage 5: Secondary | ICD-10-CM

## 2018-11-02 DIAGNOSIS — D631 Anemia in chronic kidney disease: Secondary | ICD-10-CM

## 2018-11-02 DIAGNOSIS — E119 Type 2 diabetes mellitus without complications: Secondary | ICD-10-CM

## 2018-11-02 MED ORDER — SODIUM BICARBONATE 650 MG PO TABS: 650.0000 mg | ORAL_TABLET | Freq: Two times a day (BID) | ORAL | 12 refills | Status: DC

## 2018-11-02 NOTE — Progress Notes (Signed)
Subjective:    Patient ID: [name], female    DOB: 1951-03-27, 68 y.o.   MRN: [id number]  HPI 12/2017 Patient is here today for follow-up.  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  They obtained a CT scan to monitor for recurrence.  Although this was negative for any evidence of local recurrent disease or metastatic disease there was several coincidental findings.  First there was a 3.3 x 3 x 3.2 cm low-attenuation lesion in the left adnexal region.  They were uncertain of what this would be.  It was felt most likely to be an ovarian cyst however a pelvic ultrasound was recommended for further evaluation.  They also found atherosclerosis of the coronary arteries including calcified atherosclerotic plaque in the left main, left anterior descending, left circumflex, and right coronary arteries.  Patient previously has been asymptomatic.  She denies any chest pain.  She does have dyspnea on exertion.  She is extremely sedentary.  She states that the most activity she does on a daily basis is to walk approximately 6200 feet.  With this she does get occasionally short of breath.  Again she denies angina or any chest pain.  I reviewed her lab work with her.  Her hemoglobin A1c was outstanding for this patient given her history of noncompliance at 7.3.  However her LDL cholesterol was elevated particularly when one considers the findings recently discovered on her CT scan.  She is also not tolerating Lipitor as she reports problems with short-term memory loss on the medication.  Her sister states that she had similar symptoms on Lipitor previously that resolved when she stopped the medication.  The mild memory impairment did not begin until we switched her from pravastatin to Lipitor.  At that time, my plan was: I am very happy with the patient's hemoglobin A1c given her past noncompliance.  I will make no changes  in her medication at this time regarding her diabetes.  However I do feel that there is room for improvement with regards to her hyperlipidemia.  I recommended discontinuation of Lipitor and switching the patient to Crestor 20 mg a day and rechecking a fasting lipid panel in 3 months.  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  Her anemia remains stable at greater than 10.  Her potassium remains stable.  Her blood pressure is acceptable.  I will consult cardiology regarding the findings on the CT scan for possible stress test given her age, and her significant risk factors for coronary artery disease.  Furthermore given her sedentary nature, it is difficult to evaluate the patient for dyspnea on exertion and angina she performs very little physical activity.  I will also obtain an ultrasound of the pelvis to evaluate the left adnexal mass to determine if it is in fact an ovarian cyst.  If it is a simple cyst, we we will monitor it again in 3 months to ensure resolution.  If there are findings concerning for a complicated cyst, we may consult gynecology.   11/02/18 Patient is being seen today for follow-up of her chronic medical conditions.  She is being seen today over the telephone.  She consents to be seen by telephone.  She is currently at home.  I am currently in my office.  Phone call began at 1235.  Phone call concluded at 1:00. Ultrasound of the pelvis revealed:  3.2 cm left adnexal cyst. No concerning  features by ultrasound. While this is almost certainly benign, a follow up ultrasound is recommended in 1 year according to the Society of Radiologists   Patient has been seeing Dr. Justin Mend with nephrology who is monitoring her stage V chronic kidney disease.  She is borderline requiring dialysis.  She is on sodium bicarbonate and has persistently elevated potassiums.  Most recent potassium was 5.4 in March.  She also reports fatigue.  She denies any shortness of breath or evidence of  fluid overload.  She is currently on injections for her anemia.  Most recent lab work revealed normal iron levels.  I assume that she is receiving erythropoietin injections although I am not certain of that.  She follows up monthly with the renal nurse via telephone.  She is here today discussing her other medical conditions including her blood pressure and diabetes.  Her most recent lab work is listed below: Hospital Outpatient Visit on 10/23/2018  Component Date Value Ref Range Status   Iron 10/23/2018 55  28 - 170 ug/dL Final   TIBC 10/23/2018 246* 250 - 450 ug/dL Final   Saturation Ratios 10/23/2018 22  10.4 - 31.8 % Final   UIBC 10/23/2018 191  ug/dL Final   Performed at [facility], [place] [address], [place], [place] [address]   Ferritin 10/23/2018 53  11 - 307 ng/mL Final   Performed at [facility] [address] [place], [place] [address]   Hemoglobin 10/23/2018 9.7* 12.0 - 15.0 g/dL Final  Hospital Outpatient Visit on 10/16/2018  Component Date Value Ref Range Status   Hemoglobin 10/16/2018 9.5* 12.0 - 15.0 g/dL Final  Hospital Outpatient Visit on 10/09/2018  Component Date Value Ref Range Status   Hemoglobin 10/09/2018 9.4* 12.0 - 15.0 g/dL Final  Lab on 10/01/2018  Component Date Value Ref Range Status   Cholesterol 10/01/2018 133  <200 mg/dL Final   HDL 10/01/2018 33* > OR = 50 mg/dL Final   Triglycerides 10/01/2018 133  <150 mg/dL Final   LDL Cholesterol (Calc) 10/01/2018 77  mg/dL (calc) Final   Comment: Reference range: <100 . Desirable range <100 mg/dL for primary prevention;   <70 mg/dL for patients with CHD or diabetic patients  with > or = 2 CHD risk factors. Marland Kitchen LDL-C is now calculated using the Martin-Hopkins  calculation, which is a validated novel method providing  better accuracy than the Friedewald equation in the  estimation of LDL-C.  Cresenciano Genre et al. Annamaria Helling. 3875;643(32): 2061-2068   (http://education.[facility].com/faq/FAQ164)    Total CHOL/HDL Ratio 10/01/2018 4.0  <5.0 (calc) Final   Non-HDL Cholesterol (Calc) 10/01/2018 100  <130 mg/dL (calc) Final   Comment: For patients with diabetes plus 1 major ASCVD risk  factor, treating to a non-HDL-C goal of <100 mg/dL  (LDL-C of <70 mg/dL) is considered a therapeutic  option.    Hgb A1c MFr Bld 10/01/2018 7.1* <5.7 % of total Hgb Final   Comment: For someone without known diabetes, a hemoglobin A1c value of 6.5% or greater indicates that they may have  diabetes and this should be confirmed with a follow-up  test. . For someone with known diabetes, a value <7% indicates  that their diabetes is well controlled and a value  greater than or equal to 7% indicates suboptimal  control. A1c targets should be individualized based on  duration of diabetes, age, comorbid conditions, and  other considerations. . Currently, no consensus exists regarding use of hemoglobin A1c for diagnosis of diabetes  for children. .    Mean Plasma Glucose 10/01/2018 157  (calc) Final   eAG (mmol/L) 10/01/2018 8.7  (calc) Final   Glucose, Bld 10/01/2018 60* 65 - 99 mg/dL Final   Comment: .            Fasting reference interval .    BUN 10/01/2018 62* 7 - 25 mg/dL Final   Creat 10/01/2018 4.40* 0.50 - 0.99 mg/dL Final   Comment: For patients >43 years of age, the reference limit for Creatinine is approximately 13% higher for people identified as African-American. .    BUN/Creatinine Ratio 10/01/2018 14  6 - 22 (calc) Final   Sodium 10/01/2018 145  135 - 146 mmol/L Final   Potassium 10/01/2018 5.4* 3.5 - 5.3 mmol/L Final   Chloride 10/01/2018 111* 98 - 110 mmol/L Final   CO2 10/01/2018 23  20 - 32 mmol/L Final   Calcium 10/01/2018 8.2* 8.6 - 10.4 mg/dL Final   Total Protein 10/01/2018 6.2  6.1 - 8.1 g/dL Final   Albumin 10/01/2018 3.6  3.6 - 5.1 g/dL Final   Globulin 10/01/2018 2.6  1.9 - 3.7 g/dL (calc) Final    AG Ratio 10/01/2018 1.4  1.0 - 2.5 (calc) Final   Total Bilirubin 10/01/2018 0.3  0.2 - 1.2 mg/dL Final   Alkaline phosphatase (APISO) 10/01/2018 71  37 - 153 U/L Final   AST 10/01/2018 11  10 - 35 U/L Final   ALT 10/01/2018 7  6 - 29 U/L Final   WBC 10/01/2018 6.4  3.8 - 10.8 Thousand/uL Final   RBC 10/01/2018 3.20* 3.80 - 5.10 Million/uL Final   Hemoglobin 10/01/2018 9.5* 11.7 - 15.5 g/dL Final   HCT 10/01/2018 28.7* 35.0 - 45.0 % Final   MCV 10/01/2018 89.7  80.0 - 100.0 fL Final   MCH 10/01/2018 29.7  27.0 - 33.0 pg Final   MCHC 10/01/2018 33.1  32.0 - 36.0 g/dL Final   RDW 10/01/2018 13.3  11.0 - 15.0 % Final   Platelets 10/01/2018 170  140 - 400 Thousand/uL Final   MPV 10/01/2018 10.8  7.5 - 12.5 fL Final   Neutro Abs 10/01/2018 4,582  1,500 - 7,800 cells/uL Final   Lymphs Abs 10/01/2018 1,235  850 - 3,900 cells/uL Final   Absolute Monocytes 10/01/2018 397  200 - 950 cells/uL Final   Eosinophils Absolute 10/01/2018 147  15 - 500 cells/uL Final   Basophils Absolute 10/01/2018 38  0 - 200 cells/uL Final   Neutrophils Relative % 10/01/2018 71.6  % Final   Total Lymphocyte 10/01/2018 19.3  % Final   Monocytes Relative 10/01/2018 6.2  % Final   Eosinophils Relative 10/01/2018 2.3  % Final   Basophils Relative 10/01/2018 0.6  % Final  Hospital Outpatient Visit on 09/30/2018  Component Date Value Ref Range Status   Hemoglobin 09/30/2018 9.1* 12.0 - 15.0 g/dL Final  Hospital Outpatient Visit on 09/24/2018  Component Date Value Ref Range Status   Hemoglobin 09/24/2018 9.7* 12.0 - 15.0 g/dL Final   Hemoglobin A1c is acceptable at 7.1.  LDL cholesterol is excellent at 77.  Patient denies any chest pain or shortness of breath.  She does report occasional irregular heartbeats on her monitor however she states that this occurs when she does not take the metoprolol.  If she is consistent in taking the metoprolol she denies any palpitations or irregular heartbeats.   She is followed up with the cardiologist as planned and is seeing Dr. Lennie Odor.  She denies  any hypoglycemia.  She denies any blurry vision.  She does complain of neuropathy in both of her legs for which she takes gabapentin.  She has been out of her sodium bicarbonate now for almost a week.  I explained to the patient that she is taking this due to metabolic acidosis as well as electrolyte abnormalities due to her underlying chronic kidney disease.  I explained to her the importance of taking this medication and refilled for her.  She does complain of constipation.  She is not taking her MiraLAX anymore.  She is also continuing to showed noncompliance with her diet.  She eats potatoes with every meal.  She still eats out because she hates to cook.  I explained to her the potatoes are high in potassium and hyperkalemia has been a constant issue for her and will likely be more so due to her failing kidneys.  I strongly encouraged the patient to adhere to her low potassium diet as recommended by her nephrologist. Past Medical History:  Diagnosis Date   Anemia    Anemia associated with chronic renal failure    Anemia in chronic kidney disease 09/29/2018   Arthritis    Asthma    CKD stage 3 due to type 2 diabetes mellitus ([place])    Coronary artery calcification seen on CAT scan 02/17/2018   Coronary calcification on CT   Depression    Diabetic retinopathy of both eyes (HCC)    Diabetic ulcer of right foot associated with type 2 diabetes mellitus ([place])    Essential hypertension 02/17/2018   Essential hypertension   GERD (gastroesophageal reflux disease)    pt denies   HLD (hyperlipidemia)    Hypertension    Left ventricular dysfunction 04/07/2018   Left ventricle dysfunction   Microalbuminuria due to type 2 diabetes mellitus (HCC)    Neuromuscular disorder ([place])    diabetic neuropathy   Nonproliferative retinopathy due to secondary diabetes ([place])    Nonsustained ventricular  tachycardia ([place]) 08/28/2018   Nonsustained ventricular tachycardia   Pneumonia    hx of    Renal cell cancer ([place])    Right renal mass 03/10/2017   Type 2 diabetes, controlled, with neuropathy ([place]) 06/22/2013   Ulcer of other part of foot 06/22/2013   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 [facility])    Past Surgical History:  Procedure Laterality Date   BASCILIC VEIN TRANSPOSITION Left 08/08/2017   Procedure: BASILIC VEIN TRANSPOSITION FIRST STAGE LEFT ARM;  Surgeon: Serafina Mitchell, MD;  Location: [facility];  Service: Vascular;  Laterality: Left;   [place] Left 05/14/2018   Procedure: SECOND STAGE BASILIC VEIN TRANSPOSITION LEFT ARM;  Surgeon: Serafina Mitchell, MD;  Location: [facility];  Service: Vascular;  Laterality: Left;   ROBOTIC ADRENALECTOMY Left 03/10/2017   Procedure: XI ROBOTIC ADRENALECTOMY;  Surgeon: Nickie Retort, MD;  Location: [facility];  Service: Urology;  Laterality: Left;   ROBOTIC ASSITED PARTIAL NEPHRECTOMY Right 03/10/2017   Procedure: XI ROBOTIC ASSITED RADICAL NEPHRECTOMY;  Surgeon: Nickie Retort, MD;  Location: [facility];  Service: Urology;  Laterality: Right;   TOE AMPUTATION Bilateral    both great toe ,, left foot 2nd toe 1/2   Current Outpatient Medications on File Prior to Visit  Medication Sig Dispense Refill   Blood Glucose Monitoring Suppl (ONE TOUCH ULTRA 2) w/Device KIT Use to check BS BID-QID Dx:E11.9 1 each 1   BREO ELLIPTA 100-25 MCG/INH AEPB INHALE 1 PUFF BY MOUTH ONCE  DAILY (Patient taking differently: Inhale 1 puff into the lungs daily. ) 60 each 11   calcitRIOL (ROCALTROL) 0.25 MCG capsule Take 0.25 mcg by mouth daily.     calcitRIOL (ROCALTROL) 0.5 MCG capsule Take 0.5 mcg by mouth daily.     CINNAMON PO Take 350 mg by mouth daily.      Dulaglutide (TRULICITY) 1.79 XT/0.5WP SOPN Inject 0.75 mg into the skin every Wednesday. 12 mL 0   furosemide (LASIX) 20 MG tablet Take 1 tablet by mouth once daily  90 tablet 2   gabapentin (NEURONTIN) 300 MG capsule TAKE 1 CAPSULE BY MOUTH THREE TIMES DAILY 90 capsule 0   glucose blood test strip USE AS DIRECTED TO MONITOR FSBS 3X DAILY. DX:E11.65. 200 each 0   hydrALAZINE (APRESOLINE) 25 MG tablet Take 1 tablet (25 mg total) by mouth 3 (three) times daily. 90 tablet 3   isosorbide mononitrate (IMDUR) 30 MG 24 hr tablet Take 1 tablet (30 mg total) by mouth daily. 30 tablet 3   Lancets (ONETOUCH ULTRASOFT) lancets Use as instructed 300 each 3   metoprolol succinate (TOPROL-XL) 100 MG 24 hr tablet Take 1 tablet (100 mg total) by mouth at bedtime. Take with or immediately following a meal. 30 tablet 3   polyethylene glycol (MIRALAX / GLYCOLAX) packet Take 17 g by mouth daily as needed for mild constipation.     PROAIR HFA 108 (90 Base) MCG/ACT inhaler INHALE TWO PUFFS INTO LUNGS EVERY 6 HOURS AS NEEDED FOR WHEEZING AND FOR SHORTNESS OF BREATH (Patient taking differently: Inhale 2 puffs into the lungs every 6 (six) hours as needed for wheezing or shortness of breath. ) 9 each 4   rosuvastatin (CRESTOR) 20 MG tablet Take 1 tablet (20 mg total) by mouth daily. (Patient taking differently: Take 20 mg by mouth every evening. ) 90 tablet 3   TRADJENTA 5 MG TABS tablet TAKE 1 TABLET BY MOUTH ONCE DAILY (Patient taking differently: Take 5 mg by mouth daily. ) 90 tablet 2   TRESIBA FLEXTOUCH 100 UNIT/ML SOPN FlexTouch Pen INJECT 150 UNITS SUBCUTANEOUSLY ONCE DAILY 45 mL 0   VITAMIN A PO Take 2,400 Units by mouth daily.     Current Facility-Administered Medications on File Prior to Visit  Medication Dose Route Frequency Provider Last Rate Last Dose   Bevacizumab (AVASTIN) SOLN 1.25 mg  1.25 mg Intravitreal  [name], MD   1.25 mg at 05/12/18 1319   Bevacizumab (AVASTIN) SOLN 1.25 mg  1.25 mg Intravitreal  [name], MD   1.25 mg at 06/13/18 0055   Bevacizumab (AVASTIN) SOLN 1.25 mg  1.25 mg Intravitreal  [name], MD   1.25 mg at 07/10/18 1308    Bevacizumab (AVASTIN) SOLN 1.25 mg  1.25 mg Intravitreal  [name], MD   1.25 mg at 08/21/18 1312   Bevacizumab (AVASTIN) SOLN 1.25 mg  1.25 mg Intravitreal  [name], MD   1.25 mg at 10/02/18 1510   Allergies  Allergen Reactions   Penicillins Other (See Comments)    UNSPECIFIED CHILDHOOD REACTION  Has patient had a PCN reaction causing immediate rash, facial/tongue/throat swelling, SOB or lightheadedness with hypotension: Unknown Has patient had a PCN reaction causing severe rash involving mucus membranes or skin necrosis: Unknown Has patient had a PCN reaction that required hospitalization: Unknown Has patient had a PCN reaction occurring within the last 10 years: Unknown If all of the above answers are "NO", then may proceed with Cephalosporin use.    Adhesive [  Tape] Rash   Social History   Socioeconomic History   Marital status: Single    Spouse name: Not on file   Number of children: Not on file   Years of education: Not on file   Highest education level: Not on file  Occupational History   Not on file  Social Needs   Financial resource strain: Not on file   Food insecurity:    Worry: Not on file    Inability: Not on file   Transportation needs:    Medical: Not on file    Non-medical: Not on file  Tobacco Use   Smoking status: Never Smoker   Smokeless tobacco: Never Used  Substance and Sexual Activity   Alcohol use: Yes    Alcohol/week: 0.0 standard drinks    Comment: occ   Drug use: No   Sexual activity: Not Currently    Partners: Male  Lifestyle   Physical activity:    Days per week: Not on file    Minutes per session: Not on file   Stress: Not on file  Relationships   Social connections:    Talks on phone: Not on file    Gets together: Not on file    Attends religious service: Not on file    Active member of club or organization: Not on file    Attends meetings of clubs or organizations: Not on file    Relationship  status: Not on file   Intimate partner violence:    Fear of current or ex partner: Not on file    Emotionally abused: Not on file    Physically abused: Not on file    Forced sexual activity: Not on file  Other Topics Concern   Not on file  Social History Narrative   Not on file     Review of Systems  All other systems reviewed and are negative.      Objective:  No exam was performed today as this was a telephone visit       Assessment & Plan:   Benign essential HTN  Diabetes mellitus, type II, insulin dependent (HCC)  Anemia in stage 5 chronic kidney disease, not on chronic dialysis (HCC)  CKD (chronic kidney disease) stage 5, GFR less than 15 ml/min [facility])  Patient states that she is checking her blood pressure at home.  She has now taking hydralazine 25 mg 3 times a day and she states that since she has been taking that medication her blood pressure has been "normal".  However she is unable to provide me any numbers to evaluate myself.  Her diabetes is acceptable given her chronic kidney disease with a hemoglobin A1c of 7.1.  She is currently receiving erythropoietin injections I believe under the care of her nephrologist for her anemia of chronic kidney disease.  She is also following closely with her nephrologist for her stage V chronic kidney disease.  Her most recent lab work does not show any underlying acidosis however I encouraged the patient to resume her sodium bicarbonate and I refill this for her.  Her potassium remains slightly elevated at 5.4.  I encouraged the patient to stay away from potassium rich foods such as potatoes and bananas and follow the diet as outlined for her.  Her cholesterol is now acceptable with an LDL cholesterol well below 100.  Recommended that she take MiraLAX 1-2 times a day for her diarrhea.  She could occasionally supplement with Dulcolax.  Phone call began at 1235.  Phone call concluded at 1:00.

## 2018-11-04 DIAGNOSIS — C641 Malignant neoplasm of right kidney, except renal pelvis: Secondary | ICD-10-CM | POA: Diagnosis not present

## 2018-11-04 DIAGNOSIS — Z85528 Personal history of other malignant neoplasm of kidney: Secondary | ICD-10-CM | POA: Diagnosis not present

## 2018-11-05 ENCOUNTER — Other Ambulatory Visit: Payer: Self-pay

## 2018-11-06 ENCOUNTER — Other Ambulatory Visit: Payer: Self-pay | Admitting: Family Medicine

## 2018-11-06 ENCOUNTER — Ambulatory Visit (HOSPITAL_COMMUNITY)
Admission: RE | Admit: 2018-11-06 | Discharge: 2018-11-06 | Disposition: A | Discharge status: Home / Self Care | Payer: Medicare Other | Source: Ambulatory Visit | Attending: Nephrology | Admitting: Nephrology

## 2018-11-06 ENCOUNTER — Other Ambulatory Visit: Payer: Self-pay

## 2018-11-06 VITALS — BP 125/85 | HR 79 | Temp 97.2°F | Resp 20

## 2018-11-06 DIAGNOSIS — N184 Chronic kidney disease, stage 4 (severe): Secondary | ICD-10-CM | POA: Insufficient documentation

## 2018-11-06 DIAGNOSIS — D631 Anemia in chronic kidney disease: Secondary | ICD-10-CM | POA: Insufficient documentation

## 2018-11-06 LAB — POCT HEMOGLOBIN-HEMACUE: Hemoglobin: 10.5 g/dL — ABNORMAL LOW (ref 12.0–15.0)

## 2018-11-06 MED ORDER — EPOETIN ALFA-EPBX 10000 UNIT/ML IJ SOLN
10000.0000 [IU] | INTRAMUSCULAR | Status: DC
  Administered 2018-11-06: 10000 [IU] via SUBCUTANEOUS
  Filled 2018-11-06: qty 1

## 2018-11-09 DIAGNOSIS — Z85528 Personal history of other malignant neoplasm of kidney: Secondary | ICD-10-CM | POA: Diagnosis not present

## 2018-11-10 NOTE — Progress Notes (Signed)
[facility] Note  11/12/2018     CHIEF COMPLAINT Patient presents for Retina Follow Up   HISTORY OF PRESENT ILLNESS: [name] is a 68 y.o. female who presents to the clinic today for:   HPI    Retina Follow Up    Patient presents with  Diabetic Retinopathy.  In both eyes.  This started 4 months ago.  Severity is mild.  Since onset it is stable.  I, the attending physician,  performed the HPI with the patient and updated documentation appropriately.          Comments    F/U PDR OU. Patient states her allergy's has had her itchy and watery otherwise good. Bs 264,this am (pt ate desserts yesterday) Bs have been [place] per patient       Last edited by [name], MD on 11/12/2018 11:39 AM. (History)    pt states she was supposed to see Dr. [name] for a cataract consult, but because of COVID-19, it was rescheduled to July 21  Referring physician: [name], MD [address] [facility] [place], [place] [address]  HISTORICAL INFORMATION:   Selected notes from the MEDICAL RECORD NUMBER Referred by Dr. [name] for concern of BRVO OS LEE: 10.03.19 (C. Weaver) [BCVA: OD: 20/30 OS: 20/50] Ocular Hx-cataracts OU, HTR OU, NPDR OU, DES PMH-DM (E7N: 7.3, takes Trulicity and [place]), HTN, HLD     CURRENT MEDICATIONS: No current outpatient medications on file. (Ophthalmic Drugs)   No current facility-administered medications for this visit.  (Ophthalmic Drugs)   Current Outpatient Medications (Other)  Medication Sig  . Blood Glucose Monitoring Suppl (ONE TOUCH ULTRA 2) w/Device KIT Use to check BS BID-QID Dx:E11.9  . BREO ELLIPTA 100-25 MCG/INH AEPB INHALE 1 PUFF BY MOUTH ONCE DAILY (Patient taking differently: Inhale 1 puff into the lungs daily. )  . calcitRIOL (ROCALTROL) 0.25 MCG capsule Take 0.25 mcg by mouth daily.  . calcitRIOL (ROCALTROL) 0.5 MCG capsule Take 0.5 mcg by mouth daily.  Marland Kitchen CINNAMON PO Take 350 mg by mouth daily.   .  Dulaglutide (TRULICITY) 1.70 YF/7.4BS SOPN Inject 0.75 mg into the skin every Wednesday.  . furosemide (LASIX) 20 MG tablet Take 1 tablet by mouth once daily  . gabapentin (NEURONTIN) 300 MG capsule TAKE 1 CAPSULE BY MOUTH THREE TIMES DAILY  . glucose blood test strip USE AS DIRECTED TO MONITOR FSBS 3X DAILY. DX:E11.65.  . hydrALAZINE (APRESOLINE) 25 MG tablet Take 1 tablet (25 mg total) by mouth 3 (three) times daily.  . isosorbide mononitrate (IMDUR) 30 MG 24 hr tablet Take 1 tablet (30 mg total) by mouth daily.  . Lancets (ONETOUCH ULTRASOFT) lancets Use as instructed  . metoprolol succinate (TOPROL-XL) 100 MG 24 hr tablet Take 1 tablet (100 mg total) by mouth at bedtime. Take with or immediately following a meal.  . polyethylene glycol (MIRALAX / GLYCOLAX) packet Take 17 g by mouth daily as needed for mild constipation.  Marland Kitchen PROAIR HFA 108 (90 Base) MCG/ACT inhaler INHALE TWO PUFFS INTO LUNGS EVERY 6 HOURS AS NEEDED FOR WHEEZING AND FOR SHORTNESS OF BREATH (Patient taking differently: Inhale 2 puffs into the lungs every 6 (six) hours as needed for wheezing or shortness of breath. )  . rosuvastatin (CRESTOR) 20 MG tablet Take 1 tablet (20 mg total) by mouth daily. (Patient taking differently: Take 20 mg by mouth every evening. )  . sodium bicarbonate 650 MG tablet Take 1 tablet (650  mg total) by mouth 2 (two) times daily.  . TRESIBA FLEXTOUCH 100 UNIT/ML SOPN FlexTouch Pen INJECT 150 UNITS SUBCUTANEOUSLY ONCE DAILY  . VITAMIN A PO Take 2,400 Units by mouth daily.  Marland Kitchen linagliptin (TRADJENTA) 5 MG TABS tablet Take 1 tablet (5 mg total) by mouth daily.   Current Facility-Administered Medications (Other)  Medication Route  . Bevacizumab (AVASTIN) SOLN 1.25 mg Intravitreal  . Bevacizumab (AVASTIN) SOLN 1.25 mg Intravitreal  . Bevacizumab (AVASTIN) SOLN 1.25 mg Intravitreal  . Bevacizumab (AVASTIN) SOLN 1.25 mg Intravitreal  . Bevacizumab (AVASTIN) SOLN 1.25 mg Intravitreal      REVIEW OF  SYSTEMS: ROS    Positive for: Endocrine, Eyes   Negative for: Constitutional, Gastrointestinal, Neurological, Skin, Genitourinary, Musculoskeletal, HENT, Cardiovascular, Respiratory, Psychiatric, Allergic/Imm, Heme/Lymph   Last edited by Zenovia Jordan, LPN on 08/09/1476  2:95 AM. (History)       ALLERGIES Allergies  Allergen Reactions  . Penicillins Other (See Comments)    UNSPECIFIED CHILDHOOD REACTION  Has patient had a PCN reaction causing immediate rash, facial/tongue/throat swelling, SOB or lightheadedness with hypotension: Unknown Has patient had a PCN reaction causing severe rash involving mucus membranes or skin necrosis: Unknown Has patient had a PCN reaction that required hospitalization: Unknown Has patient had a PCN reaction occurring within the last 10 years: Unknown If all of the above answers are "NO", then may proceed with Cephalosporin use.   . Adhesive [Tape] Rash    PAST MEDICAL HISTORY Past Medical History:  Diagnosis Date  . Anemia   . Anemia associated with chronic renal failure   . Anemia in chronic kidney disease 09/29/2018  . Arthritis   . Asthma   . CKD stage 3 due to type 2 diabetes mellitus ([place])   . Coronary artery calcification seen on CAT scan 02/17/2018   Coronary calcification on CT  . Depression   . Diabetic retinopathy of both eyes ([place])   . Diabetic ulcer of right foot associated with type 2 diabetes mellitus ([place])   . Essential hypertension 02/17/2018   Essential hypertension  . GERD (gastroesophageal reflux disease)    pt denies  . HLD (hyperlipidemia)   . Hypertension   . Left ventricular dysfunction 04/07/2018   Left ventricle dysfunction  . Microalbuminuria due to type 2 diabetes mellitus ([place])   . Neuromuscular disorder ([place])    diabetic neuropathy  . Nonproliferative retinopathy due to secondary diabetes ([place])   . Nonsustained ventricular tachycardia ([place]) 08/28/2018   Nonsustained ventricular tachycardia  . Pneumonia    hx of    . Renal cell cancer ([place])   . Right renal mass 03/10/2017  . Type 2 diabetes, controlled, with neuropathy ([place]) 06/22/2013  . Ulcer of other part of foot 06/22/2013  . 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 BASCILIC VEIN TRANSPOSITION Left 08/08/2017   Procedure: BASILIC VEIN TRANSPOSITION FIRST STAGE LEFT ARM;  Surgeon: Serafina Mitchell, MD;  Location: [facility];  Service: Vascular;  Laterality: Left;  . BASCILIC VEIN TRANSPOSITION Left 05/14/2018   Procedure: SECOND STAGE BASILIC VEIN TRANSPOSITION LEFT ARM;  Surgeon: Serafina Mitchell, MD;  Location: [facility];  Service: Vascular;  Laterality: Left;  . ROBOTIC ADRENALECTOMY Left 03/10/2017   Procedure: XI ROBOTIC ADRENALECTOMY;  Surgeon: Nickie Retort, MD;  Location: [facility];  Service: Urology;  Laterality: Left;  . ROBOTIC ASSITED PARTIAL NEPHRECTOMY Right 03/10/2017   Procedure: XI ROBOTIC ASSITED RADICAL NEPHRECTOMY;  Surgeon: Nickie Retort, MD;  Location: [facility];  Service: Urology;  Laterality: Right;  . TOE AMPUTATION Bilateral    both great toe ,, left foot 2nd toe 1/2    FAMILY HISTORY Family History  Problem Relation Age of Onset  . Heart disease Mother   . Diabetes Sister     SOCIAL HISTORY Social History   Tobacco Use  . Smoking status: Never Smoker  . Smokeless tobacco: Never Used  Substance Use Topics  . Alcohol use: Yes    Alcohol/week: 0.0 standard drinks    Comment: occ  . Drug use: No         OPHTHALMIC EXAM:  Base Eye Exam    Visual Acuity (Snellen - Linear)      Right Left   Dist [place] 20/50 -1 20/150 -1   Dist ph [place] 20/40 +2 20/50 +1       Tonometry (Tonopen, 9:50 AM)      Right Left   Pressure 15 17       Pupils      Dark Light Shape React APD   Right 3 2 Round Brisk None   Left 3 2 Round Brisk None       Visual Fields (Counting fingers)      Left Right    Full Full       Extraocular Movement       Right Left    Full, Ortho Full, Ortho       Neuro/Psych    Oriented x3:  Yes   Mood/Affect:  Normal       Dilation    Both eyes:  1.0% Mydriacyl, 2.5% Phenylephrine @ 9:47 AM        Slit Lamp and Fundus Exam    Slit Lamp Exam      Right Left   Lids/Lashes Dermatochalasis - upper lid, mild MGD Dermatochalasis - upper lid, Telangiectasia, mild MGD   Conjunctiva/Sclera White and quiet White and quiet   Cornea 1+ Punctate epithelial erosions, mild Arcus 1+ Punctate epithelial erosions   Anterior Chamber Deep and quiet Deep and quiet   Iris Round and dilated, No NVI Round and well dilated   Lens 2-3+ Nuclear sclerosis with early brunescence, 3+ Cortical cataract 3+ Nuclear sclerosis, early brunescense, 2-3+ Cortical cataract, 1-2+ Posterior subcapsular cataract   Vitreous Vitreous syneresis, Posterior vitreous detachment Vitreous syneresis       Fundus Exam      Right Left   Disc Pink and Sharp Pink and Sharp   C/D Ratio 0.3 0.2   Macula Blunted foveal reflex, scattered MA and IRH, +cystic changes nasal macula Blunted foveal reflex, scattered DBH, small cluster of DBH and CWS IN macula - mildly improved   Vessels Vascular attenuation, Tortuous Tortuous, Vascular attenuation   Periphery Attached, scattered DBH and small CWS nasal to disc Attached, scattered MA/DBH          IMAGING AND PROCEDURES  Imaging and Procedures for '@TODAY'$ @  OCT, Retina - OU - Both Eyes       Right Eye Quality was good. Central Foveal Thickness: 249. Progression has worsened. Findings include normal foveal contour, no SRF, intraretinal fluid (Mild Interval increase in non-central IRF nasal macula).   Left Eye Quality was borderline. Central Foveal Thickness: 240. Progression has improved. Findings include normal foveal contour, intraretinal fluid, no SRF, vitreomacular adhesion , intraretinal hyper-reflective material, no IRF (Mild interval improvement in nasal IRF).   Notes *Images captured and  stored on  drive  Diagnosis / Impression:  NFP; +IRF; no SRF OU noncentral DME OU (OS > OD) OD - Mild Interval increase in non-central IRF nasal macula OS - Mild interval improvement in nasal IRF  Clinical management:  See below  Abbreviations: NFP - Normal foveal profile. CME - cystoid macular edema. PED - pigment epithelial detachment. IRF - intraretinal fluid. SRF - subretinal fluid. EZ - ellipsoid zone. ERM - epiretinal membrane. ORA - outer retinal atrophy. ORT - outer retinal tubulation. SRHM - subretinal hyper-reflective material        Intravitreal Injection, Pharmacologic Agent - OS - Left Eye       Time Out 11/12/2018. 10:29 AM. Confirmed correct patient, procedure, site, and patient consented.   Anesthesia Topical anesthesia was used. Anesthetic medications included Lidocaine 2%, Proparacaine 0.5%.   Procedure Preparation included 5% betadine to ocular surface, eyelid speculum. A supplied needle was used.   Injection:  1.25 mg Bevacizumab (AVASTIN) SOLN   NDC: 43154-008-67, Lot: [id number] , Expiration date: 12/09/2018   Route: Intravitreal, Site: Left Eye, Waste: 0 mL  Post-op Post injection exam found visual acuity of at least counting fingers. The patient tolerated the procedure well. There were no complications. The patient received written and verbal post procedure care education.                 ASSESSMENT/PLAN:    ICD-10-CM   1. Moderate nonproliferative diabetic retinopathy of both eyes with macular edema associated with type 2 diabetes mellitus (HCC) 12/22/2018 Intravitreal Injection, Pharmacologic Agent - OS - Left Eye    Bevacizumab (AVASTIN) SOLN 1.25 mg  2. Retinal edema H35.81 OCT, Retina - OU - Both Eyes  3. Essential hypertension I10   4. Hypertensive retinopathy of both eyes H35.033   5. Combined forms of age-related cataract of both eyes H25.813     1,2 Moderate non-proliferative diabetic retinopathy w/ DME OU (OS > OD) - Initial exam  shows scattered MA, BCVA 20/50 OS - Initial FA (05/12/18) shows leaking MA, no NV OS - Initial OCT showed diabetic macular edema OU (OS > OD)  - s/p IVA #1 OS (10.22.19), #2 (11.20.19), #3 (12.20.19), #4 (01.31.20), #5 (03.13.20) - Today BCVA 20/50 OS, 20/40 OD -- trending down mostly from cataract progression - OCT w/ improved but persistent IRF/IRHM OS. OD shows mild interval increase in noncentral edema - recommend IVA #6 OS today, 04.22.20 - discussed possible need for IVA OD in the future - pt wishes to proceed - RBA of procedure discussed, questions answered - informed consent obtained and signed - see procedure note - f/u in 6 wks, DFE, OCT, possible injxn OU  3,4. Hypertensive retinopathy OU - discussed importance of tight BP control - monitor  5. Combined Form Cataracts OU (OS > OD) - The symptoms of cataract, surgical options, and treatments and risks were discussed with patient. - discussed diagnosis and progression - cataracts now visually significant OU - under the expert management of Dr. 05.05.20 -- COVID-19 restrictions preventing surgery scheduling - clear from a retina standpoint to proceed with cataract surgery when pt and surgeon are ready - recommend coordinating care so that pt receives anti-VEGF 1-2 wks prior to cataract surgery    Ophthalmic Meds Ordered this visit:  Meds ordered this encounter  Medications  . Bevacizumab (AVASTIN) SOLN 1.25 mg       Return in about 6 weeks (around 12/24/2018) for Dilated Exam, OCT.  There are no Patient Instructions on file for this visit.  Explained the diagnoses, plan, and follow up with the patient and they expressed understanding.  Patient expressed understanding of the importance of proper follow up care.   This document serves as a record of services personally performed by Gardiner Sleeper, MD, PhD. It was created on their behalf by Ernest Mallick, OA, an ophthalmic assistant. The creation of this record is the  provider's dictation and/or activities during the visit.    Electronically signed by: Ernest Mallick, OA  04.21.2020 11:43 AM     Gardiner Sleeper, M.D., Ph.D. Diseases & Surgery of the Retina and Vitreous [facility]   I have reviewed the above documentation for accuracy and completeness, and I agree with the above. Gardiner Sleeper, M.D., Ph.D. 11/12/18 11:43 AM     Abbreviations: M myopia (nearsighted); A astigmatism; H hyperopia (farsighted); P presbyopia; Mrx spectacle prescription;  CTL contact lenses; OD right eye; OS left eye; OU both eyes  XT exotropia; ET esotropia; PEK punctate epithelial keratitis; PEE punctate epithelial erosions; DES dry eye syndrome; MGD meibomian gland dysfunction; ATs artificial tears; PFAT's preservative free artificial tears; [place] nuclear sclerotic cataract; PSC posterior subcapsular cataract; ERM epi-retinal membrane; PVD posterior vitreous detachment; RD retinal detachment; DM diabetes mellitus; DR diabetic retinopathy; NPDR non-proliferative diabetic retinopathy; PDR proliferative diabetic retinopathy; CSME clinically significant macular edema; DME diabetic macular edema; dbh dot blot hemorrhages; CWS cotton wool spot; POAG primary open angle glaucoma; C/D cup-to-disc ratio; HVF humphrey visual field; GVF goldmann visual field; OCT optical coherence tomography; IOP intraocular pressure; BRVO Branch retinal vein occlusion; CRVO central retinal vein occlusion; CRAO central retinal artery occlusion; BRAO branch retinal artery occlusion; RT retinal tear; SB scleral buckle; PPV pars plana vitrectomy; VH Vitreous hemorrhage; PRP panretinal laser photocoagulation; IVK intravitreal kenalog; VMT vitreomacular traction; MH Macular hole;  NVD neovascularization of the disc; NVE neovascularization elsewhere; AREDS age related eye disease study; ARMD age related macular degeneration; POAG primary open angle glaucoma; EBMD epithelial/anterior basement  membrane dystrophy; ACIOL anterior chamber intraocular lens; IOL intraocular lens; PCIOL posterior chamber intraocular lens; Phaco/IOL phacoemulsification with intraocular lens placement; [place] photorefractive keratectomy; LASIK laser assisted in situ keratomileusis; HTN hypertension; DM diabetes mellitus; COPD chronic obstructive pulmonary disease

## 2018-11-12 ENCOUNTER — Other Ambulatory Visit: Payer: Self-pay | Admitting: Family Medicine

## 2018-11-12 ENCOUNTER — Encounter (INDEPENDENT_AMBULATORY_CARE_PROVIDER_SITE_OTHER): Payer: Self-pay | Admitting: Ophthalmology

## 2018-11-12 ENCOUNTER — Other Ambulatory Visit: Payer: Self-pay

## 2018-11-12 ENCOUNTER — Ambulatory Visit (INDEPENDENT_AMBULATORY_CARE_PROVIDER_SITE_OTHER): Payer: Medicare Other | Admitting: Ophthalmology

## 2018-11-12 DIAGNOSIS — I10 Essential (primary) hypertension: Secondary | ICD-10-CM | POA: Diagnosis not present

## 2018-11-12 DIAGNOSIS — E113313 Type 2 diabetes mellitus with moderate nonproliferative diabetic retinopathy with macular edema, bilateral: Secondary | ICD-10-CM

## 2018-11-12 DIAGNOSIS — H35033 Hypertensive retinopathy, bilateral: Secondary | ICD-10-CM | POA: Diagnosis not present

## 2018-11-12 DIAGNOSIS — H3581 Retinal edema: Secondary | ICD-10-CM

## 2018-11-12 DIAGNOSIS — H25813 Combined forms of age-related cataract, bilateral: Secondary | ICD-10-CM

## 2018-11-12 MED ORDER — BEVACIZUMAB CHEMO INJECTION 1.25MG/0.05ML SYRINGE FOR KALEIDOSCOPE
1.2500 mg | INTRAVITREAL | Status: AC | PRN
  Administered 2018-11-12: 12:00:00 1.25 mg via INTRAVITREAL

## 2018-11-13 ENCOUNTER — Encounter (INDEPENDENT_AMBULATORY_CARE_PROVIDER_SITE_OTHER): Payer: Medicare Other | Admitting: Ophthalmology

## 2018-11-16 ENCOUNTER — Other Ambulatory Visit: Payer: Self-pay | Admitting: Family Medicine

## 2018-11-19 ENCOUNTER — Other Ambulatory Visit: Payer: Self-pay

## 2018-11-20 ENCOUNTER — Ambulatory Visit (HOSPITAL_COMMUNITY)
Admission: RE | Admit: 2018-11-20 | Discharge: 2018-11-20 | Disposition: A | Discharge status: Home / Self Care | Payer: Medicare Other | Source: Ambulatory Visit | Attending: Nephrology | Admitting: Nephrology

## 2018-11-20 VITALS — BP 127/75 | HR 88 | Temp 97.6°F | Resp 20

## 2018-11-20 DIAGNOSIS — N184 Chronic kidney disease, stage 4 (severe): Secondary | ICD-10-CM | POA: Insufficient documentation

## 2018-11-20 DIAGNOSIS — D631 Anemia in chronic kidney disease: Secondary | ICD-10-CM | POA: Diagnosis not present

## 2018-11-20 LAB — POCT HEMOGLOBIN-HEMACUE: Hemoglobin: 10.2 g/dL — ABNORMAL LOW (ref 12.0–15.0)

## 2018-11-20 LAB — IRON AND TIBC
Iron: 36 ug/dL (ref 28–170)
Saturation Ratios: 12 % (ref 10.4–31.8)
TIBC: 291 ug/dL (ref 250–450)
UIBC: 255 ug/dL

## 2018-11-20 LAB — FERRITIN: Ferritin: 45 ng/mL (ref 11–307)

## 2018-11-20 MED ORDER — EPOETIN ALFA-EPBX 10000 UNIT/ML IJ SOLN
10000.0000 [IU] | INTRAMUSCULAR | Status: DC
  Administered 2018-11-20: 10000 [IU] via SUBCUTANEOUS
  Filled 2018-11-20: qty 1

## 2018-11-29 ENCOUNTER — Other Ambulatory Visit: Payer: Self-pay | Admitting: Family Medicine

## 2018-12-01 ENCOUNTER — Telehealth (INDEPENDENT_AMBULATORY_CARE_PROVIDER_SITE_OTHER): Payer: Medicare Other | Admitting: Cardiovascular Disease

## 2018-12-01 ENCOUNTER — Telehealth: Payer: Self-pay

## 2018-12-01 ENCOUNTER — Encounter: Payer: Self-pay | Admitting: Cardiovascular Disease

## 2018-12-01 DIAGNOSIS — I472 Ventricular tachycardia: Secondary | ICD-10-CM | POA: Diagnosis not present

## 2018-12-01 DIAGNOSIS — I519 Heart disease, unspecified: Secondary | ICD-10-CM

## 2018-12-01 DIAGNOSIS — I10 Essential (primary) hypertension: Secondary | ICD-10-CM

## 2018-12-01 DIAGNOSIS — I2510 Atherosclerotic heart disease of native coronary artery without angina pectoris: Secondary | ICD-10-CM | POA: Diagnosis not present

## 2018-12-01 DIAGNOSIS — E782 Mixed hyperlipidemia: Secondary | ICD-10-CM

## 2018-12-01 DIAGNOSIS — I4729 Other ventricular tachycardia: Secondary | ICD-10-CM

## 2018-12-01 NOTE — Progress Notes (Signed)
Virtual Visit via Telephone Note   This visit type was conducted due to national recommendations for restrictions regarding the COVID-19 Pandemic (e.g. social distancing) in an effort to limit this patient's exposure and mitigate transmission in our community.  Due to her co-morbid illnesses, this patient is at least at moderate risk for complications without adequate follow up.  This format is felt to be most appropriate for this patient at this time.  The patient did not have access to video technology/had technical difficulties with video requiring transitioning to audio format only (telephone).  All issues noted in this document were discussed and addressed.  No physical exam could be performed with this format.  Please refer to the patient's chart for her  consent to telehealth for [facility].   Date:  12/01/2018   ID:  [name], DOB [birth year], MRN [id number]  Patient Location: Home Provider Location: Home  PCP:  [name], MD  Cardiologist: Dr. Quay Burow Electrophysiologist:  Will Meredith Leeds, MD   Evaluation Performed:  Follow-Up Visit  Chief Complaint:  NSVT, Systolic HF  History of Present Illness:    [name] is a 68 y.o.  moderately overweight single Caucasian female mother of 2, grandmother of 3 grandchildren company by her Sister [name] today. She was referred by Dr. Dennard Schaumann for cardiovascular evaluation because of coronary calcification seen on CT scan performed 10/31/2017.   I last saw her in the office 08/28/2018.  She does have history of treated diabetes, hypertension and hyperlipidemia. She does have a strong family history with 2 brothers and her father all of whom had ischemic heart disease. He is never had a heart attack or stroke. He does complain of some back pain on exertion with dyspnea. She had a nephrectomy in the past currently has CKD 4 with a dialysis fistula placed by Dr. Trula Slade recently in anticipation of hemodialysis.  She  had an AV fistula placed by Dr. Trula Slade successfully without complication.  She does have reactive airways disease and does complain of some shortness of breath.  He does not smoke.  Since I saw her in the office on 08/28/2018 I referred her to Dr. Curt Bears for EP eval.  She did have a monitor that showed episodes of nonsustained ventricular tachycardia.  He changed her carvedilol to Toprol and added hydralazine and Imdur.  She is not a candidate for an ACE/ARB because of her chronic renal insufficiency.   The patient does not have symptoms concerning for COVID-19 infection (fever, chills, cough, or new shortness of breath).    Past Medical History:  Diagnosis Date  . Anemia   . Anemia associated with chronic renal failure   . Anemia in chronic kidney disease 09/29/2018  . Arthritis   . Asthma   . CKD stage 3 due to type 2 diabetes mellitus ([place])   . Coronary artery calcification seen on CAT scan 02/17/2018   Coronary calcification on CT  . Depression   . Diabetic retinopathy of both eyes ([place])   . Diabetic ulcer of right foot associated with type 2 diabetes mellitus ([place])   . Essential hypertension 02/17/2018   Essential hypertension  . GERD (gastroesophageal reflux disease)    pt denies  . HLD (hyperlipidemia)   . Hypertension   . Left ventricular dysfunction 04/07/2018   Left ventricle dysfunction  . Microalbuminuria due to type 2 diabetes mellitus ([place])   . Neuromuscular disorder ([place])    diabetic neuropathy  . Nonproliferative retinopathy due  to secondary diabetes ([place])   . Nonsustained ventricular tachycardia ([place]) 08/28/2018   Nonsustained ventricular tachycardia  . Pneumonia    hx of   . Renal cell cancer ([place])   . Right renal mass 03/10/2017  . Type 2 diabetes, controlled, with neuropathy ([place]) 06/22/2013  . Ulcer of other part of foot 06/22/2013  . 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 BASCILIC  VEIN TRANSPOSITION Left 08/08/2017   Procedure: BASILIC VEIN TRANSPOSITION FIRST STAGE LEFT ARM;  Surgeon: Serafina Mitchell, MD;  Location: [place];  Service: Vascular;  Laterality: Left;  . BASCILIC VEIN TRANSPOSITION Left 05/14/2018   Procedure: SECOND STAGE BASILIC VEIN TRANSPOSITION LEFT ARM;  Surgeon: Serafina Mitchell, MD;  Location: [place];  Service: Vascular;  Laterality: Left;  . ROBOTIC ADRENALECTOMY Left 03/10/2017   Procedure: XI ROBOTIC ADRENALECTOMY;  Surgeon: Nickie Retort, MD;  Location: [facility];  Service: Urology;  Laterality: Left;  . ROBOTIC ASSITED PARTIAL NEPHRECTOMY Right 03/10/2017   Procedure: XI ROBOTIC ASSITED RADICAL NEPHRECTOMY;  Surgeon: Nickie Retort, MD;  Location: [facility];  Service: Urology;  Laterality: Right;  . TOE AMPUTATION Bilateral    both great toe ,, left foot 2nd toe 1/2     Current Meds  Medication Sig  . Blood Glucose Monitoring Suppl (ONE TOUCH ULTRA 2) w/Device KIT Use to check BS BID-QID Dx:E11.9  . BREO ELLIPTA 100-25 MCG/INH AEPB INHALE 1 PUFF BY MOUTH ONCE DAILY (Patient taking differently: Inhale 1 puff into the lungs daily. )  . calcitRIOL (ROCALTROL) 0.5 MCG capsule Take 0.5 mcg by mouth daily.  Marland Kitchen CINNAMON PO Take 350 mg by mouth daily.   . Dulaglutide (TRULICITY) 6.96 EX/5.2WU SOPN Inject 0.75 mg into the skin every Wednesday.  . furosemide (LASIX) 20 MG tablet Take 1 tablet by mouth once daily  . gabapentin (NEURONTIN) 300 MG capsule TAKE 1 CAPSULE BY MOUTH THREE TIMES DAILY  . glucose blood (ONE TOUCH ULTRA TEST) test strip USE AS DIRECTED TO MONITOR  FSBS 3 TIMES DAILY  . hydrALAZINE (APRESOLINE) 25 MG tablet Take 1 tablet (25 mg total) by mouth 3 (three) times daily.  . isosorbide mononitrate (IMDUR) 30 MG 24 hr tablet Take 1 tablet (30 mg total) by mouth daily.  . Lancets (ONETOUCH ULTRASOFT) lancets Use as instructed  . linagliptin (TRADJENTA) 5 MG TABS tablet Take 1 tablet (5 mg total) by mouth daily.  . metoprolol succinate  (TOPROL-XL) 100 MG 24 hr tablet Take 1 tablet (100 mg total) by mouth at bedtime. Take with or immediately following a meal.  . PROAIR HFA 108 (90 Base) MCG/ACT inhaler Inhale 2 puffs into the lungs every 6 (six) hours as needed for wheezing or shortness of breath.  . rosuvastatin (CRESTOR) 20 MG tablet Take 1 tablet (20 mg total) by mouth daily. (Patient taking differently: Take 20 mg by mouth every evening. )  . sodium bicarbonate 650 MG tablet Take 1 tablet (650 mg total) by mouth 2 (two) times daily.  . solifenacin (VESICARE) 5 MG tablet Take 5 mg by mouth daily.  . TRESIBA FLEXTOUCH 100 UNIT/ML SOPN FlexTouch Pen INJECT 150 UNITS SUBCUTANEOUSLY ONCE DAILY  . VITAMIN A PO Take 2,400 Units by mouth daily.  . [DISCONTINUED] calcitRIOL (ROCALTROL) 0.25 MCG capsule Take 0.25 mcg by mouth daily.  . [DISCONTINUED] polyethylene glycol (MIRALAX / GLYCOLAX) packet Take 17 g by mouth daily as needed for mild constipation.  Current Facility-Administered Medications for the 12/01/18 encounter (Telemedicine) with Lorretta Harp, MD  Medication  . Bevacizumab (AVASTIN) SOLN 1.25 mg  . Bevacizumab (AVASTIN) SOLN 1.25 mg  . Bevacizumab (AVASTIN) SOLN 1.25 mg  . Bevacizumab (AVASTIN) SOLN 1.25 mg  . Bevacizumab (AVASTIN) SOLN 1.25 mg     Allergies:   Penicillins and Adhesive [tape]   Social History   Tobacco Use  . Smoking status: Never Smoker  . Smokeless tobacco: Never Used  Substance Use Topics  . Alcohol use: Yes    Alcohol/week: 0.0 standard drinks    Comment: occ  . Drug use: No     Family Hx: The patient's family history includes Diabetes in her sister; Heart disease in her mother.  ROS:   Please see the history of present illness.     All other systems reviewed and are negative.   Prior CV studies:   The following studies were reviewed today:  None  Labs/Other Tests and Data Reviewed:    EKG:  No ECG reviewed.  Recent Labs: 10/01/2018: ALT 7; BUN 62; Creat 4.40;  Platelets 170; Potassium 5.4; Sodium 145 11/20/2018: Hemoglobin 10.2   Recent Lipid Panel Lab Results  Component Value Date/Time   CHOL 133 10/01/2018 11:06 AM   TRIG 133 10/01/2018 11:06 AM   HDL 33 (L) 10/01/2018 11:06 AM   CHOLHDL 4.0 10/01/2018 11:06 AM   LDLCALC 77 10/01/2018 11:06 AM    Wt Readings from Last 3 Encounters:  12/01/18 240 lb (108.9 kg)  09/16/18 244 lb (110.7 kg)  08/28/18 242 lb 6.4 oz (110 kg)     Objective:    Vital Signs:  BP (!) 142/72   Pulse 67   Ht 5' 8"  (1.727 m)   Wt 240 lb (108.9 kg)   BMI 36.49 kg/m    VITAL SIGNS:  reviewed the patient did not check her vital signs today.  A physical exam was not performed since this was a virtual telemedicine phone visit.  ASSESSMENT & PLAN:    1. Essential hypertension- her blood pressure was not checked today at home.  She is on hydralazine and Toprol 2. Hyperlipidemia- history of hyperlipidemia on Crestor with lipid profile performed 10/01/2018 revealing total cholesterol 133, LDL 77 and HDL 33. 3. Left ventricular dysfunction- history of moderate LV dysfunction with an EF in the 40% range 4. Nonsustained ventricular tachycardia- medications changed from carvedilol to Toprol by Dr. Curt Bears 5. Chronic renal insufficiency- AV fistula in place but not yet at the point where she needs dialysis.  COVID-19 Education: The signs and symptoms of COVID-19 were discussed with the patient and how to seek care for testing (follow up with PCP or arrange E-visit).  The importance of social distancing was discussed today.  Time:   Today, I have spent 7 minutes with the patient with telehealth technology discussing the above problems.     Medication Adjustments/Labs and Tests Ordered: Current medicines are reviewed at length with the patient today.  Concerns regarding medicines are outlined above.   Tests Ordered: No orders of the defined types were placed in this encounter.   Medication Changes: No orders of the  defined types were placed in this encounter.   Disposition:  Follow up in 3 month(s)  Signed, Quay Burow, MD  12/01/2018 12:31 PM    [facility]

## 2018-12-01 NOTE — Telephone Encounter (Signed)
LEFT MESSAGE OF AVS INSTRUCTIONS ON PT VOICEMAIL PER DPR. Letter including After Visit Summary and any other necessary documents to be mailed to the patient's address on file.

## 2018-12-01 NOTE — Patient Instructions (Signed)
Medication Instructions:  Your physician recommends that you continue on your current medications as directed. Please refer to the Current Medication list given to you today. If you need a refill on your cardiac medications before your next appointment, please call your pharmacy.   Lab work: NONE If you have labs (blood work) drawn today and your tests are completely normal, you will receive your results only by: Marland Kitchen MyChart Message (if you have MyChart) OR . A paper copy in the mail If you have any lab test that is abnormal or we need to change your treatment, we will call you to review the results.  Testing/Procedures: NONE  Follow-Up: At [facility], you and your health needs are our priority.  As part of our continuing mission to provide you with exceptional heart care, we have created designated Provider Care Teams.  These Care Teams include your primary Cardiologist (physician) and Advanced Practice Providers (APPs -  Physician Assistants and Nurse Practitioners) who all work together to provide you with the care you need, when you need it. . You will need a follow up appointment in 3 months with an APP and in 6 months with Dr. Gwenlyn Found.  Please call our office 2 months in advance to schedule this appointment.  You may see one of the following Advanced Practice Providers on your designated Care Team:   . Kerin Ransom, [place] . Almyra Deforest, PA-C . Fabian Sharp, PA-C . Jory Sims, DNP . Rosaria Ferries, PA-C . Roby Lofts, PA-C . Sande Rives, PA-C

## 2018-12-03 ENCOUNTER — Other Ambulatory Visit: Payer: Self-pay

## 2018-12-04 ENCOUNTER — Ambulatory Visit (HOSPITAL_COMMUNITY)
Admission: RE | Admit: 2018-12-04 | Discharge: 2018-12-04 | Disposition: A | Discharge status: Home / Self Care | Payer: Medicare Other | Source: Ambulatory Visit | Attending: Nephrology | Admitting: Nephrology

## 2018-12-04 VITALS — BP 114/58 | HR 82 | Temp 97.9°F | Resp 20

## 2018-12-04 DIAGNOSIS — D631 Anemia in chronic kidney disease: Secondary | ICD-10-CM | POA: Insufficient documentation

## 2018-12-04 DIAGNOSIS — N184 Chronic kidney disease, stage 4 (severe): Secondary | ICD-10-CM | POA: Insufficient documentation

## 2018-12-04 LAB — POCT HEMOGLOBIN-HEMACUE: Hemoglobin: 10.5 g/dL — ABNORMAL LOW (ref 12.0–15.0)

## 2018-12-04 MED ORDER — EPOETIN ALFA-EPBX 10000 UNIT/ML IJ SOLN
10000.0000 [IU] | INTRAMUSCULAR | Status: DC
  Administered 2018-12-04: 10000 [IU] via SUBCUTANEOUS
  Filled 2018-12-04: qty 1

## 2018-12-17 ENCOUNTER — Other Ambulatory Visit: Payer: Self-pay

## 2018-12-18 ENCOUNTER — Ambulatory Visit (HOSPITAL_COMMUNITY)
Admission: RE | Admit: 2018-12-18 | Discharge: 2018-12-18 | Disposition: A | Discharge status: Home / Self Care | Payer: Medicare Other | Source: Ambulatory Visit | Attending: Nephrology | Admitting: Nephrology

## 2018-12-18 VITALS — BP 126/62 | HR 68 | Temp 97.9°F | Resp 20

## 2018-12-18 DIAGNOSIS — N184 Chronic kidney disease, stage 4 (severe): Secondary | ICD-10-CM | POA: Diagnosis not present

## 2018-12-18 DIAGNOSIS — D631 Anemia in chronic kidney disease: Secondary | ICD-10-CM | POA: Diagnosis not present

## 2018-12-18 LAB — POCT HEMOGLOBIN-HEMACUE: Hemoglobin: 9.9 g/dL — ABNORMAL LOW (ref 12.0–15.0)

## 2018-12-18 MED ORDER — EPOETIN ALFA-EPBX 10000 UNIT/ML IJ SOLN
10000.0000 [IU] | INTRAMUSCULAR | Status: DC
  Administered 2018-12-18: 10000 [IU] via SUBCUTANEOUS
  Filled 2018-12-18: qty 1

## 2018-12-22 ENCOUNTER — Telehealth: Payer: Self-pay | Admitting: *Deleted

## 2018-12-22 NOTE — Telephone Encounter (Signed)
[facility] Calling patient to verify appointment on 6\9\2020, it will be an in office OV.

## 2018-12-23 NOTE — Progress Notes (Addendum)
[facility] Note  12/25/2018     CHIEF COMPLAINT Patient presents for Retina Follow Up   HISTORY OF PRESENT ILLNESS: [name] is a 68 y.o. female who presents to the clinic today for:   HPI    Retina Follow Up    Patient presents with  Diabetic Retinopathy.  In both eyes.  Severity is moderate.  Duration of 6 weeks.  Since onset it is stable.  I, the attending physician,  performed the HPI with the patient and updated documentation appropriately.          Comments    6 week retinal eval-Patient states vision the same OU. BS was 224 this am. Last a1c was 7.1 in March 2020.        Last edited by [name], MD on 12/25/2018 10:28 AM. (History)    pt states she is doing well, she states her blood pressure has been under control   Referring physician: [name], MD [place], [place] [id number]  HISTORICAL INFORMATION:   Selected notes from the MEDICAL RECORD NUMBER Referred by Dr. [name] for concern of BRVO OS LEE: 10.03.19 (C. Weaver) [BCVA: OD: 20/30 OS: 20/50] Ocular Hx-cataracts OU, HTR OU, NPDR OU, DES PMH-DM (F7T: 7.3, takes Trulicity and [place]), HTN, HLD     CURRENT MEDICATIONS: No current outpatient medications on file. (Ophthalmic Drugs)   No current facility-administered medications for this visit.  (Ophthalmic Drugs)   Current Outpatient Medications (Other)  Medication Sig  . Blood Glucose Monitoring Suppl (ONE TOUCH ULTRA 2) w/Device KIT Use to check BS BID-QID Dx:E11.9  . BREO ELLIPTA 100-25 MCG/INH AEPB INHALE 1 PUFF BY MOUTH ONCE DAILY (Patient taking differently: Inhale 1 puff into the lungs daily. )  . calcitRIOL (ROCALTROL) 0.5 MCG capsule Take 0.5 mcg by mouth daily.  Marland Kitchen CINNAMON PO Take 350 mg by mouth daily.   . Dulaglutide (TRULICITY) 0.24 OX/7.3ZH SOPN Inject 0.75 mg into the skin every Wednesday.  . furosemide (LASIX) 20 MG tablet Take 1 tablet by mouth once daily  .  gabapentin (NEURONTIN) 300 MG capsule TAKE 1 CAPSULE BY MOUTH THREE TIMES DAILY  . glucose blood (ONE TOUCH ULTRA TEST) test strip USE AS DIRECTED TO MONITOR  FSBS 3 TIMES DAILY  . Lancets (ONETOUCH ULTRASOFT) lancets Use as instructed  . linagliptin (TRADJENTA) 5 MG TABS tablet Take 1 tablet (5 mg total) by mouth daily.  Marland Kitchen PROAIR HFA 108 (90 Base) MCG/ACT inhaler Inhale 2 puffs into the lungs every 6 (six) hours as needed for wheezing or shortness of breath.  . rosuvastatin (CRESTOR) 20 MG tablet Take 1 tablet (20 mg total) by mouth daily. (Patient taking differently: Take 20 mg by mouth every evening. )  . sodium bicarbonate 650 MG tablet Take 1 tablet (650 mg total) by mouth 2 (two) times daily.  . solifenacin (VESICARE) 5 MG tablet Take 5 mg by mouth daily.  . TRESIBA FLEXTOUCH 100 UNIT/ML SOPN FlexTouch Pen INJECT 150 UNITS SUBCUTANEOUSLY ONCE DAILY  . VITAMIN A PO Take 2,400 Units by mouth daily.  . hydrALAZINE (APRESOLINE) 25 MG tablet Take 1 tablet (25 mg total) by mouth 3 (three) times daily.  . isosorbide mononitrate (IMDUR) 30 MG 24 hr tablet Take 1 tablet (30 mg total) by mouth daily.  . metoprolol succinate (TOPROL-XL) 100 MG 24 hr tablet Take 1 tablet (100 mg total) by mouth at bedtime. Take with or immediately following a  meal.   Current Facility-Administered Medications (Other)  Medication Route  . Bevacizumab (AVASTIN) SOLN 1.25 mg Intravitreal  . Bevacizumab (AVASTIN) SOLN 1.25 mg Intravitreal  . Bevacizumab (AVASTIN) SOLN 1.25 mg Intravitreal  . Bevacizumab (AVASTIN) SOLN 1.25 mg Intravitreal  . Bevacizumab (AVASTIN) SOLN 1.25 mg Intravitreal      REVIEW OF SYSTEMS: ROS    Positive for: Endocrine, Eyes   Negative for: Constitutional, Gastrointestinal, Neurological, Skin, Genitourinary, Musculoskeletal, HENT, Cardiovascular, Respiratory, Psychiatric, Allergic/Imm, Heme/Lymph   Last edited by Roselee Nova D on 12/25/2018  9:58 AM. (History)       ALLERGIES Allergies   Allergen Reactions  . Penicillins Other (See Comments)    UNSPECIFIED CHILDHOOD REACTION  Has patient had a PCN reaction causing immediate rash, facial/tongue/throat swelling, SOB or lightheadedness with hypotension: Unknown Has patient had a PCN reaction causing severe rash involving mucus membranes or skin necrosis: Unknown Has patient had a PCN reaction that required hospitalization: Unknown Has patient had a PCN reaction occurring within the last 10 years: Unknown If all of the above answers are "NO", then may proceed with Cephalosporin use.   . Adhesive [Tape] Rash    PAST MEDICAL HISTORY Past Medical History:  Diagnosis Date  . Anemia   . Anemia associated with chronic renal failure   . Anemia in chronic kidney disease 09/29/2018  . Arthritis   . Asthma   . CKD stage 3 due to type 2 diabetes mellitus ([place])   . Coronary artery calcification seen on CAT scan 02/17/2018   Coronary calcification on CT  . Depression   . Diabetic retinopathy of both eyes ([place])   . Diabetic ulcer of right foot associated with type 2 diabetes mellitus ([place])   . Essential hypertension 02/17/2018   Essential hypertension  . GERD (gastroesophageal reflux disease)    pt denies  . HLD (hyperlipidemia)   . Hypertension   . Left ventricular dysfunction 04/07/2018   Left ventricle dysfunction  . Microalbuminuria due to type 2 diabetes mellitus ([place])   . Neuromuscular disorder ([place])    diabetic neuropathy  . Nonproliferative retinopathy due to secondary diabetes ([place])   . Nonsustained ventricular tachycardia ([place]) 08/28/2018   Nonsustained ventricular tachycardia  . Pneumonia    hx of   . Renal cell cancer ([place])   . Right renal mass 03/10/2017  . Type 2 diabetes, controlled, with neuropathy ([place]) 06/22/2013  . Ulcer of other part of foot 06/22/2013  . 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 BASCILIC VEIN TRANSPOSITION Left  08/08/2017   Procedure: BASILIC VEIN TRANSPOSITION FIRST STAGE LEFT ARM;  Surgeon: Serafina Mitchell, MD;  Location: [place];  Service: Vascular;  Laterality: Left;  . BASCILIC VEIN TRANSPOSITION Left 05/14/2018   Procedure: SECOND STAGE BASILIC VEIN TRANSPOSITION LEFT ARM;  Surgeon: Serafina Mitchell, MD;  Location: [place];  Service: Vascular;  Laterality: Left;  . ROBOTIC ADRENALECTOMY Left 03/10/2017   Procedure: XI ROBOTIC ADRENALECTOMY;  Surgeon: Nickie Retort, MD;  Location: [facility];  Service: Urology;  Laterality: Left;  . ROBOTIC ASSITED PARTIAL NEPHRECTOMY Right 03/10/2017   Procedure: XI ROBOTIC ASSITED RADICAL NEPHRECTOMY;  Surgeon: Nickie Retort, MD;  Location: [facility];  Service: Urology;  Laterality: Right;  . TOE AMPUTATION Bilateral    both great toe ,, left foot 2nd toe 1/2    FAMILY HISTORY Family History  Problem Relation Age of Onset  . Heart disease  Mother   . Diabetes Sister     SOCIAL HISTORY Social History   Tobacco Use  . Smoking status: Never Smoker  . Smokeless tobacco: Never Used  Substance Use Topics  . Alcohol use: Yes    Alcohol/week: 0.0 standard drinks    Comment: occ  . Drug use: No         OPHTHALMIC EXAM:  Base Eye Exam    Visual Acuity (Snellen - Linear)      Right Left   Dist [place] 20/25 20/150 -2   Dist ph [place] NI 20/60 +2       Tonometry (Tonopen, 10:06 AM)      Right Left   Pressure 12 13       Pupils      Dark Light Shape React APD   Right 3 2 Round Brisk None   Left 3 2 Round Brisk None       Visual Fields (Counting fingers)      Left Right    Full Full       Extraocular Movement      Right Left    Full, Ortho Full, Ortho       Neuro/Psych    Oriented x3:  Yes   Mood/Affect:  Normal       Dilation    Both eyes:  1.0% Mydriacyl, 2.5% Phenylephrine @ 10:06 AM        Slit Lamp and Fundus Exam    Slit Lamp Exam      Right Left   Lids/Lashes Dermatochalasis - upper lid, mild MGD Dermatochalasis - upper  lid, Telangiectasia, mild MGD   Conjunctiva/Sclera White and quiet White and quiet   Cornea 1+ Punctate epithelial erosions, mild Arcus 1+ Punctate epithelial erosions   Anterior Chamber Deep and quiet Deep and quiet   Iris Round and dilated, No NVI Round and well dilated   Lens 2-3+ Nuclear sclerosis with early brunescence, 3+ Cortical cataract 3+ Nuclear sclerosis, early brunescense, 2-3+ Cortical cataract, 1-2+ Posterior subcapsular cataract   Vitreous Vitreous syneresis, Posterior vitreous detachment Vitreous syneresis       Fundus Exam      Right Left   Disc Pink and Sharp Pink and Sharp   C/D Ratio 0.3 0.2   Macula Blunted foveal reflex, scattered MA and IRH, +cystic changes nasal macula Blunted foveal reflex, scattered DBH, small cluster of DBH and CWS IN macula - mildly improved   Vessels Vascular attenuation, Tortuous Tortuous, Vascular attenuation   Periphery Attached, scattered DBH and small CWS nasal to disc - improving Attached, scattered MA/DBH          IMAGING AND PROCEDURES  Imaging and Procedures for _0 @  OCT, Retina - OU - Both Eyes       Right Eye Quality was good. Central Foveal Thickness: 245. Progression has improved. Findings include normal foveal contour, no SRF, intraretinal fluid (Mild Interval decrease in non-central IRF nasal macula).   Left Eye Quality was borderline. Central Foveal Thickness: 237. Progression has improved. Findings include normal foveal contour, intraretinal fluid, no SRF, vitreomacular adhesion , intraretinal hyper-reflective material (interval improvement in nasal IRF).   Notes *Images captured and stored on drive  Diagnosis / Impression:  NFP; +IRF; no SRF OU noncentral DME OU (OS > OD) OD - Mild Interval decrease in non-central IRF nasal macula OS - Interval improvement in nasal IRF  Clinical management:  See below  Abbreviations: NFP - Normal foveal profile. CME - cystoid macular edema. PED -  pigment epithelial  detachment. IRF - intraretinal fluid. SRF - subretinal fluid. EZ - ellipsoid zone. ERM - epiretinal membrane. ORA - outer retinal atrophy. ORT - outer retinal tubulation. SRHM - subretinal hyper-reflective material        Intravitreal Injection, Pharmacologic Agent - OS - Left Eye       Time Out 12/25/2018. 11:26 AM. Confirmed correct patient, procedure, site, and patient consented.   Anesthesia Topical anesthesia was used. Anesthetic medications included Lidocaine 2%, Proparacaine 0.5%.   Procedure Preparation included 5% betadine to ocular surface, eyelid speculum. A supplied needle was used.   Injection:  1.25 mg Bevacizumab (AVASTIN) SOLN   NDC: 44967-591-63, Lot: [id number] , Expiration date: 02/10/2019   Route: Intravitreal, Site: Left Eye, Waste: 0 mL  Post-op Post injection exam found visual acuity of at least counting fingers. The patient tolerated the procedure well. There were no complications. The patient received written and verbal post procedure care education.                 ASSESSMENT/PLAN:    ICD-10-CM   1. Moderate nonproliferative diabetic retinopathy of both eyes with macular edema associated with type 2 diabetes mellitus (HCC) W46.6599 Intravitreal Injection, Pharmacologic Agent - OS - Left Eye    Bevacizumab (AVASTIN) SOLN 1.25 mg  2. Retinal edema H35.81 OCT, Retina - OU - Both Eyes  3. Essential hypertension I10   4. Hypertensive retinopathy of both eyes H35.033   5. Combined forms of age-related cataract of both eyes H25.813     1,2 Moderate non-proliferative diabetic retinopathy w/ DME OU (OS > OD)  - Initial exam shows scattered MA, BCVA 20/50 OS  - Initial FA (05/12/18) shows leaking MA, no NV OS  - Initial OCT showed diabetic macular edema OU (OS > OD)   - s/p IVA #1 OS (10.22.19), #2 (11.20.19), #3 (12.20.19), #4 (01.31.20), #5 (03.13.20), #6 (04.22.20)  - today BCVA 20/60 OS, 20/25 OD -- trending down mostly from cataract progression  -  OCT w/ interval improvement in IRF OS, OD shows mild interval decrease in noncentral edema  - recommend IVA #7 OS today, 06.05.20  - discussed possible need for IVA OD in the future  - pt wishes to proceed  - RBA of procedure discussed, questions answered  - informed consent obtained and signed  - see procedure note  - f/u in 6 wks, DFE, OCT, possible injxn OU  3,4. Hypertensive retinopathy OU  - discussed importance of tight BP control  - monitor  5. Combined Form Cataracts OU (OS > OD)  - The symptoms of cataract, surgical options, and treatments and risks were discussed with patient.  - discussed diagnosis and progression  - cataracts now visually significant OU  - under the expert management of Dr. [name]  - recommend moving cataract f/u sooner than July -- appt rescheduled for 06.09.20  - clear from a retina standpoint to proceed with cataract surgery when pt and surgeon are ready  - recommend coordinating care so that pt receives anti-VEGF 1-2 wks prior to cataract surgery, if possible    Ophthalmic Meds Ordered this visit:  Meds ordered this encounter  Medications  . Bevacizumab (AVASTIN) SOLN 1.25 mg       Return in about 6 weeks (around 02/05/2019) for f/u NPDR OU, DFE, OCT.  There are no Patient Instructions on file for this visit.   Explained the diagnoses, plan, and follow up with the patient and they expressed understanding.  Patient expressed understanding of  the importance of proper follow up care.   This document serves as a record of services personally performed by Gardiner Sleeper, MD, PhD. It was created on their behalf by Ernest Mallick, OA, an ophthalmic assistant. The creation of this record is the provider's dictation and/or activities during the visit.    Electronically signed by: Ernest Mallick, OA  06.03.2020 11:46 AM     Gardiner Sleeper, M.D., Ph.D. Diseases & Surgery of the Retina and Vitreous [facility]   I have reviewed  the above documentation for accuracy and completeness, and I agree with the above. Gardiner Sleeper, M.D., Ph.D. 12/25/18 11:46 AM    Abbreviations: M myopia (nearsighted); A astigmatism; H hyperopia (farsighted); P presbyopia; Mrx spectacle prescription;  CTL contact lenses; OD right eye; OS left eye; OU both eyes  XT exotropia; ET esotropia; PEK punctate epithelial keratitis; PEE punctate epithelial erosions; DES dry eye syndrome; MGD meibomian gland dysfunction; ATs artificial tears; PFAT's preservative free artificial tears; [place] nuclear sclerotic cataract; PSC posterior subcapsular cataract; ERM epi-retinal membrane; PVD posterior vitreous detachment; RD retinal detachment; DM diabetes mellitus; DR diabetic retinopathy; NPDR non-proliferative diabetic retinopathy; PDR proliferative diabetic retinopathy; CSME clinically significant macular edema; DME diabetic macular edema; dbh dot blot hemorrhages; CWS cotton wool spot; POAG primary open angle glaucoma; C/D cup-to-disc ratio; HVF humphrey visual field; GVF goldmann visual field; OCT optical coherence tomography; IOP intraocular pressure; BRVO Branch retinal vein occlusion; CRVO central retinal vein occlusion; CRAO central retinal artery occlusion; BRAO branch retinal artery occlusion; RT retinal tear; SB scleral buckle; PPV pars plana vitrectomy; VH Vitreous hemorrhage; PRP panretinal laser photocoagulation; IVK intravitreal kenalog; VMT vitreomacular traction; MH Macular hole;  NVD neovascularization of the disc; NVE neovascularization elsewhere; AREDS age related eye disease study; ARMD age related macular degeneration; POAG primary open angle glaucoma; EBMD epithelial/anterior basement membrane dystrophy; ACIOL anterior chamber intraocular lens; IOL intraocular lens; PCIOL posterior chamber intraocular lens; Phaco/IOL phacoemulsification with intraocular lens placement; [place] photorefractive keratectomy; LASIK laser assisted in situ keratomileusis; HTN  hypertension; DM diabetes mellitus; COPD chronic obstructive pulmonary disease

## 2018-12-25 ENCOUNTER — Ambulatory Visit (INDEPENDENT_AMBULATORY_CARE_PROVIDER_SITE_OTHER): Payer: Medicare Other | Admitting: Ophthalmology

## 2018-12-25 ENCOUNTER — Other Ambulatory Visit: Payer: Self-pay

## 2018-12-25 ENCOUNTER — Encounter (INDEPENDENT_AMBULATORY_CARE_PROVIDER_SITE_OTHER): Payer: Self-pay | Admitting: Ophthalmology

## 2018-12-25 DIAGNOSIS — H3581 Retinal edema: Secondary | ICD-10-CM | POA: Diagnosis not present

## 2018-12-25 DIAGNOSIS — H25813 Combined forms of age-related cataract, bilateral: Secondary | ICD-10-CM | POA: Diagnosis not present

## 2018-12-25 DIAGNOSIS — I10 Essential (primary) hypertension: Secondary | ICD-10-CM

## 2018-12-25 DIAGNOSIS — E113313 Type 2 diabetes mellitus with moderate nonproliferative diabetic retinopathy with macular edema, bilateral: Secondary | ICD-10-CM | POA: Diagnosis not present

## 2018-12-25 DIAGNOSIS — H35033 Hypertensive retinopathy, bilateral: Secondary | ICD-10-CM

## 2018-12-25 MED ORDER — BEVACIZUMAB CHEMO INJECTION 1.25MG/0.05ML SYRINGE FOR KALEIDOSCOPE
1.2500 mg | INTRAVITREAL | Status: AC | PRN
  Administered 2018-12-25: 1.25 mg via INTRAVITREAL

## 2018-12-28 NOTE — Telephone Encounter (Signed)
Calling patient to verify in office appointment.      COVID-19 Pre-Screening Questions:  . In the past 7 to 10 days have you had a cough,  shortness of breath, headache, congestion, fever (100 or greater) body aches, chills, sore throat, or sudden loss of taste or sense of smell? no . Have you been around anyone with known Covid 19. no . Have you been around anyone who is awaiting Covid 19 test results in the past 7 to 10 days?  no . Have you been around anyone who has been exposed to Covid 19, or has mentioned symptoms of Covid 19 within the past 7 to 10 days?  No  If you have any concerns/questions about symptoms patients report during screening (either on the phone or at threshold). Contact the provider seeing the patient or DOD for further guidance.  If neither are available contact a member of the leadership team.

## 2018-12-29 ENCOUNTER — Ambulatory Visit (INDEPENDENT_AMBULATORY_CARE_PROVIDER_SITE_OTHER): Payer: Medicare Other | Admitting: Cardiology

## 2018-12-29 ENCOUNTER — Encounter: Payer: Self-pay | Admitting: Cardiology

## 2018-12-29 ENCOUNTER — Other Ambulatory Visit: Payer: Self-pay

## 2018-12-29 VITALS — BP 120/80 | HR 64 | Ht 68.0 in | Wt 248.0 lb

## 2018-12-29 DIAGNOSIS — I472 Ventricular tachycardia, unspecified: Secondary | ICD-10-CM

## 2018-12-29 DIAGNOSIS — H35033 Hypertensive retinopathy, bilateral: Secondary | ICD-10-CM | POA: Diagnosis not present

## 2018-12-29 DIAGNOSIS — H25042 Posterior subcapsular polar age-related cataract, left eye: Secondary | ICD-10-CM | POA: Diagnosis not present

## 2018-12-29 DIAGNOSIS — H2512 Age-related nuclear cataract, left eye: Secondary | ICD-10-CM | POA: Diagnosis not present

## 2018-12-29 DIAGNOSIS — H2513 Age-related nuclear cataract, bilateral: Secondary | ICD-10-CM | POA: Diagnosis not present

## 2018-12-29 DIAGNOSIS — E113313 Type 2 diabetes mellitus with moderate nonproliferative diabetic retinopathy with macular edema, bilateral: Secondary | ICD-10-CM | POA: Diagnosis not present

## 2018-12-29 DIAGNOSIS — H25013 Cortical age-related cataract, bilateral: Secondary | ICD-10-CM | POA: Diagnosis not present

## 2018-12-29 LAB — HM DIABETES EYE EXAM

## 2018-12-29 NOTE — Progress Notes (Signed)
Electrophysiology Office Note   Date:  12/29/2018   ID:  [name], [name] 06-01-1951, MRN [id number]  PCP:  [name] Frizzle, MD  Cardiologist:  Gwenlyn Found Primary Electrophysiologist:  Constance Haw, MD    No chief complaint on file.    History of Present Illness: [name] is a 68 y.o. female who is being seen today for the evaluation of NSVT at the request of [name] Frizzle, MD. Presenting today for electrophysiology evaluation.  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  She recently had a dialysis fistula placed.  She was having palpitations and wore a 30-day monitor that showed runs of nonsustained VT.  Her carvedilol was increased.  She has been feeling poorly over the last few weeks.  She says that she just simply feels weak and fatigued.  This is been worse since her carvedilol dose was increased.  Due to that, she has not been fully compliant with her medications.  She is taking carvedilol at times once a day.  She has continued to have palpitations.  Her palpitations last a few minutes at a time.  She says that when she wore her cardiac monitor she did not have the same palpitations.  She did have nonsustained VT on her monitor, which lasted at most 13 beats.  Today, denies symptoms of palpitations, chest pain, shortness of breath, orthopnea, PND, lower extremity edema, claudication, dizziness, presyncope, syncope, bleeding, or neurologic sequela. The patient is tolerating medications without difficulties.  Overall she is doing well.  Her palpitations are greatly improved with an increased dose of her beta-blocker.  She did have one episode a few weeks ago when she missed her metoprolol.  After she took her medications, palpitations greatly improved.  Past Medical History:  Diagnosis Date  . Anemia   . Anemia associated with chronic renal failure   . Anemia in  chronic kidney disease 09/29/2018  . Arthritis   . Asthma   . CKD stage 3 due to type 2 diabetes mellitus ([place])   . Coronary artery calcification seen on CAT scan 02/17/2018   Coronary calcification on CT  . Depression   . Diabetic retinopathy of both eyes ([place])   . Diabetic ulcer of right foot associated with type 2 diabetes mellitus ([place])   . Essential hypertension 02/17/2018   Essential hypertension  . GERD (gastroesophageal reflux disease)    pt denies  . HLD (hyperlipidemia)   . Hypertension   . Left ventricular dysfunction 04/07/2018   Left ventricle dysfunction  . Microalbuminuria due to type 2 diabetes mellitus ([place])   . Neuromuscular disorder ([place])    diabetic neuropathy  . Nonproliferative retinopathy due to secondary diabetes ([place])   . Nonsustained ventricular tachycardia ([place]) 08/28/2018   Nonsustained ventricular tachycardia  . Pneumonia    hx of   . Renal cell cancer ([place])   . Right renal mass 03/10/2017  . Type 2 diabetes, controlled, with neuropathy ([place]) 06/22/2013  . Ulcer of other part of foot 06/22/2013  . 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 BASCILIC VEIN TRANSPOSITION Left 08/08/2017   Procedure: BASILIC VEIN TRANSPOSITION FIRST STAGE LEFT ARM;  Surgeon: Serafina Mitchell, MD;  Location: [place];  Service: Vascular;  Laterality: Left;  . BASCILIC VEIN TRANSPOSITION Left 05/14/2018   Procedure: SECOND STAGE BASILIC VEIN TRANSPOSITION  LEFT ARM;  Surgeon: Serafina Mitchell, MD;  [facility];  Service: Vascular;  Laterality: Left;  . ROBOTIC ADRENALECTOMY Left 03/10/2017   Procedure: XI ROBOTIC ADRENALECTOMY;  Surgeon: Nickie Retort, MD;  Location: [facility] ORS;  Service: Urology;  Laterality: Left;  . ROBOTIC ASSITED PARTIAL NEPHRECTOMY Right 03/10/2017   Procedure: XI ROBOTIC ASSITED RADICAL NEPHRECTOMY;  Surgeon: Nickie Retort, MD;  Location: [facility];  Service: Urology;  Laterality:  Right;  . TOE AMPUTATION Bilateral    both great toe ,, left foot 2nd toe 1/2     Current Outpatient Medications  Medication Sig Dispense Refill  . Blood Glucose Monitoring Suppl (ONE TOUCH ULTRA 2) w/Device KIT Use to check BS BID-QID Dx:E11.9 1 each 1  . BREO ELLIPTA 100-25 MCG/INH AEPB INHALE 1 PUFF BY MOUTH ONCE DAILY (Patient taking differently: Inhale 1 puff into the lungs daily. ) 60 each 11  . calcitRIOL (ROCALTROL) 0.5 MCG capsule Take 0.5 mcg by mouth daily.    Marland Kitchen CINNAMON PO Take 350 mg by mouth daily.     . Dulaglutide (TRULICITY) 0.62 BJ/6.2GB SOPN Inject 0.75 mg into the skin every Wednesday. 12 mL 0  . furosemide (LASIX) 20 MG tablet Take 1 tablet by mouth once daily 90 tablet 2  . gabapentin (NEURONTIN) 300 MG capsule TAKE 1 CAPSULE BY MOUTH THREE TIMES DAILY 90 capsule 0  . glucose blood (ONE TOUCH ULTRA TEST) test strip USE AS DIRECTED TO MONITOR  FSBS 3 TIMES DAILY 100 each 3  . hydrALAZINE (APRESOLINE) 25 MG tablet Take 1 tablet (25 mg total) by mouth 3 (three) times daily. 90 tablet 3  . isosorbide mononitrate (IMDUR) 30 MG 24 hr tablet Take 1 tablet (30 mg total) by mouth daily. 30 tablet 3  . Lancets (ONETOUCH ULTRASOFT) lancets Use as instructed 300 each 3  . linagliptin (TRADJENTA) 5 MG TABS tablet Take 1 tablet (5 mg total) by mouth daily. 90 tablet 1  . metoprolol succinate (TOPROL-XL) 100 MG 24 hr tablet Take 1 tablet (100 mg total) by mouth at bedtime. Take with or immediately following a meal. 30 tablet 3  . PROAIR HFA 108 (90 Base) MCG/ACT inhaler Inhale 2 puffs into the lungs every 6 (six) hours as needed for wheezing or shortness of breath. 9 g 3  . rosuvastatin (CRESTOR) 20 MG tablet Take 1 tablet (20 mg total) by mouth daily. (Patient taking differently: Take 20 mg by mouth every evening. ) 90 tablet 3  . sodium bicarbonate 650 MG tablet Take 1 tablet (650 mg total) by mouth 2 (two) times daily. 60 tablet 12  . solifenacin (VESICARE) 5 MG tablet Take 5 mg by  mouth daily.    . TRESIBA FLEXTOUCH 100 UNIT/ML SOPN FlexTouch Pen INJECT 150 UNITS SUBCUTANEOUSLY ONCE DAILY 45 mL 0  . VITAMIN A PO Take 2,400 Units by mouth daily.     Current Facility-Administered Medications  Medication Dose Route Frequency Provider Last Rate Last Dose  . Bevacizumab (AVASTIN) SOLN 1.25 mg  1.25 mg Intravitreal  [name], MD   1.25 mg at 05/12/18 1319  . Bevacizumab (AVASTIN) SOLN 1.25 mg  1.25 mg Intravitreal  [name], MD   1.25 mg at 06/13/18 0055  . Bevacizumab (AVASTIN) SOLN 1.25 mg  1.25 mg Intravitreal  [name], MD   1.25 mg at 07/10/18 1308  . Bevacizumab (AVASTIN) SOLN 1.25 mg  1.25 mg Intravitreal  [name], MD   1.25 mg at 08/21/18 1312  .  Bevacizumab (AVASTIN) SOLN 1.25 mg  1.25 mg Intravitreal  [name], MD   1.25 mg at 10/02/18 1510    Allergies:   Penicillins and Adhesive [tape]   Social History:  The patient  reports that she has never smoked. She has never used smokeless tobacco. She reports current alcohol use. She reports that she does not use drugs.   Family History:  The patient's family history includes Diabetes in her sister; Heart disease in her mother.    ROS:  Please see the history of present illness.   Otherwise, review of systems is positive for none.   All other systems are reviewed and negative.   PHYSICAL EXAM: VS:  BP 120/80   Pulse 64   Ht 5' 8"  (1.727 m)   Wt 248 lb (112.5 kg)   BMI 37.71 kg/m  , BMI Body mass index is 37.71 kg/m. GEN: Well nourished, well developed, in no acute distress  HEENT: normal  Neck: no JVD, carotid bruits, or masses Cardiac: RRR; no murmurs, rubs, or gallops,no edema  Respiratory:  clear to auscultation bilaterally, normal work of breathing GI: soft, nontender, nondistended, + BS MS: no deformity or atrophy  Skin: warm and dry Neuro:  Strength and sensation are intact Psych: euthymic mood, full affect  EKG:  EKG is not ordered today. Personal review of the ekg  ordered 08/28/18 shows SR, rate 70, septal Q waves   Recent Labs: 10/01/2018: ALT 7; BUN 62; Creat 4.40; Platelets 170; Potassium 5.4; Sodium 145 12/18/2018: Hemoglobin 9.9    Lipid Panel     Component Value Date/Time   CHOL 133 10/01/2018 1106   TRIG 133 10/01/2018 1106   HDL 33 (L) 10/01/2018 1106   CHOLHDL 4.0 10/01/2018 1106   VLDL 42 (H) 12/30/2016 1153   LDLCALC 77 10/01/2018 1106     Wt Readings from Last 3 Encounters:  12/29/18 248 lb (112.5 kg)  12/01/18 240 lb (108.9 kg)  09/16/18 244 lb (110.7 kg)      Other studies Reviewed: Additional studies/ records that were reviewed today include: TTE 03/02/18  Review of the above records today demonstrates:  - Left ventricle: The cavity size was normal. Wall thickness was   increased in a pattern of mild LVH. The estimated ejection   fraction was 40%. Diffuse hypokinesis. Doppler parameters are   consistent with abnormal left ventricular relaxation (grade 1   diastolic dysfunction). - Aortic valve: There was no stenosis. - Aorta: Ascending aortic diameter: 37 mm (S). - Ascending aorta: The ascending aorta was borderline dilated. - Mitral valve: Mildly calcified annulus. There was no significant   regurgitation. - Left atrium: The atrium was mildly dilated. - Right ventricle: The cavity size was normal. Systolic function   was normal. - Right atrium: The atrium was mildly dilated. - Pulmonary arteries: No complete TR doppler jet so unable to   estimate PA systolic pressure. - Inferior vena cava: The vessel was normal in size. The   respirophasic diameter changes were in the normal range (>= 50%),   consistent with normal central venous pressure. - Pericardium, extracardiac: A trivial pericardial effusion was   identified posterior to the heart.  SPECT 02/25/18  The left ventricular ejection fraction is moderately decreased (30-44%).  Nuclear stress EF: 43%.  There was no ST segment deviation noted during stress.   This is a low risk study.   Abnormal, low risk stress nuclear study with no ischemia or infarction.  Gated ejection fraction 43% with  diffuse hypokinesis.  However LV function appears better than calculated ejection fraction.  Moderate left ventricular enlargement.  Suggest echocardiogram to further assess.  Monitor 08/20/18 - personally reviewed 1. Sinus rhythm with PVCs 2. Runs of nonsustained ventricular tachycardia  ASSESSMENT AND PLAN:  1.  Nonsustained ventricular tachycardia: Patient has been taking her Toprol-XL which is greatly improved her palpitations.  On 1 day that she missed it, she had prolonged palpitations, but they went away after she was compliant with her medications.  No changes.  2.  Chronic systolic heart failure: Avoiding ACE inhibitor's and ARB's due to renal dysfunction.  Currently on hydralazine, Imdur, Toprol-XL.   Current medicines are reviewed at length with the patient today.   The patient does not have concerns regarding her medicines.  The following changes were made today: None  Labs/ tests ordered today include:  No orders of the defined types were placed in this encounter.   Disposition:   FU with Dominion Kathan 6 months  Signed, Connery Shiffler Meredith Leeds, MD  12/29/2018 10:15 AM     [facility] [address] [place] [address] [phone] (office) [phone] (fax)

## 2018-12-30 ENCOUNTER — Encounter: Payer: Self-pay | Admitting: *Deleted

## 2018-12-30 DIAGNOSIS — E1122 Type 2 diabetes mellitus with diabetic chronic kidney disease: Secondary | ICD-10-CM | POA: Diagnosis not present

## 2018-12-30 DIAGNOSIS — D509 Iron deficiency anemia, unspecified: Secondary | ICD-10-CM | POA: Diagnosis not present

## 2018-12-30 DIAGNOSIS — N184 Chronic kidney disease, stage 4 (severe): Secondary | ICD-10-CM | POA: Diagnosis not present

## 2018-12-30 DIAGNOSIS — N2581 Secondary hyperparathyroidism of renal origin: Secondary | ICD-10-CM | POA: Diagnosis not present

## 2018-12-30 DIAGNOSIS — D631 Anemia in chronic kidney disease: Secondary | ICD-10-CM | POA: Diagnosis not present

## 2018-12-31 ENCOUNTER — Other Ambulatory Visit (HOSPITAL_COMMUNITY): Payer: Self-pay | Admitting: *Deleted

## 2019-01-01 ENCOUNTER — Other Ambulatory Visit: Payer: Self-pay

## 2019-01-01 ENCOUNTER — Ambulatory Visit (HOSPITAL_COMMUNITY)
Admission: RE | Admit: 2019-01-01 | Discharge: 2019-01-01 | Disposition: A | Discharge status: Home / Self Care | Payer: Medicare Other | Source: Ambulatory Visit | Attending: Nephrology | Admitting: Nephrology

## 2019-01-01 VITALS — BP 131/75 | HR 78 | Temp 98.6°F | Resp 20

## 2019-01-01 DIAGNOSIS — D631 Anemia in chronic kidney disease: Secondary | ICD-10-CM | POA: Insufficient documentation

## 2019-01-01 DIAGNOSIS — N184 Chronic kidney disease, stage 4 (severe): Secondary | ICD-10-CM | POA: Insufficient documentation

## 2019-01-01 LAB — POCT HEMOGLOBIN-HEMACUE: Hemoglobin: 9.6 g/dL — ABNORMAL LOW (ref 12.0–15.0)

## 2019-01-01 MED ORDER — SODIUM CHLORIDE 0.9 % IV SOLN
510.0000 mg | INTRAVENOUS | Status: DC
  Administered 2019-01-01: 510 mg via INTRAVENOUS
  Filled 2019-01-01: qty 510

## 2019-01-01 MED ORDER — EPOETIN ALFA-EPBX 10000 UNIT/ML IJ SOLN
10000.0000 [IU] | INTRAMUSCULAR | Status: DC
  Administered 2019-01-01: 10000 [IU] via SUBCUTANEOUS
  Filled 2019-01-01: qty 1

## 2019-01-01 NOTE — Discharge Instructions (Signed)

## 2019-01-13 ENCOUNTER — Other Ambulatory Visit: Payer: Self-pay | Admitting: Family Medicine

## 2019-01-14 NOTE — Telephone Encounter (Signed)
Requested Prescriptions   Pending Prescriptions Disp Refills  . gabapentin (NEURONTIN) 300 MG capsule [Pharmacy Med Name: Gabapentin 300 MG Oral Capsule] 90 capsule 0    Sig: TAKE 1 CAPSULE BY MOUTH THREE TIMES DAILY   Last OV 11/02/2018 Last written 10/29/2018

## 2019-01-15 ENCOUNTER — Encounter (HOSPITAL_COMMUNITY)
Admission: RE | Admit: 2019-01-15 | Discharge: 2019-01-15 | Disposition: A | Discharge status: Home / Self Care | Payer: Medicare Other | Source: Ambulatory Visit | Attending: Nephrology | Admitting: Nephrology

## 2019-01-15 ENCOUNTER — Other Ambulatory Visit: Payer: Self-pay

## 2019-01-15 VITALS — BP 105/67 | HR 66 | Temp 97.2°F | Resp 20

## 2019-01-15 DIAGNOSIS — D631 Anemia in chronic kidney disease: Secondary | ICD-10-CM | POA: Diagnosis not present

## 2019-01-15 DIAGNOSIS — N184 Chronic kidney disease, stage 4 (severe): Secondary | ICD-10-CM | POA: Diagnosis not present

## 2019-01-15 LAB — POCT HEMOGLOBIN-HEMACUE: Hemoglobin: 9.9 g/dL — ABNORMAL LOW (ref 12.0–15.0)

## 2019-01-15 MED ORDER — EPOETIN ALFA-EPBX 10000 UNIT/ML IJ SOLN
10000.0000 [IU] | INTRAMUSCULAR | Status: DC
  Administered 2019-01-15: 13:00:00 10000 [IU] via SUBCUTANEOUS
  Filled 2019-01-15: qty 1

## 2019-01-15 MED ORDER — SODIUM CHLORIDE 0.9 % IV SOLN
510.0000 mg | INTRAVENOUS | Status: AC
  Administered 2019-01-15: 510 mg via INTRAVENOUS
  Filled 2019-01-15: qty 17

## 2019-01-20 DIAGNOSIS — H25812 Combined forms of age-related cataract, left eye: Secondary | ICD-10-CM | POA: Diagnosis not present

## 2019-01-20 DIAGNOSIS — H2512 Age-related nuclear cataract, left eye: Secondary | ICD-10-CM | POA: Diagnosis not present

## 2019-01-27 ENCOUNTER — Other Ambulatory Visit: Payer: Medicare Other

## 2019-01-28 ENCOUNTER — Other Ambulatory Visit: Payer: Self-pay

## 2019-01-29 ENCOUNTER — Ambulatory Visit (HOSPITAL_COMMUNITY)
Admission: RE | Admit: 2019-01-29 | Discharge: 2019-01-29 | Disposition: A | Discharge status: Home / Self Care | Payer: Medicare Other | Source: Ambulatory Visit | Attending: Nephrology | Admitting: Nephrology

## 2019-01-29 VITALS — BP 103/61 | HR 64 | Temp 97.1°F | Resp 20

## 2019-01-29 DIAGNOSIS — D631 Anemia in chronic kidney disease: Secondary | ICD-10-CM

## 2019-01-29 DIAGNOSIS — N184 Chronic kidney disease, stage 4 (severe): Secondary | ICD-10-CM | POA: Insufficient documentation

## 2019-01-29 LAB — POCT HEMOGLOBIN-HEMACUE: Hemoglobin: 10.2 g/dL — ABNORMAL LOW (ref 12.0–15.0)

## 2019-01-29 LAB — IRON AND TIBC
Iron: 52 ug/dL (ref 28–170)
Saturation Ratios: 24 % (ref 10.4–31.8)
TIBC: 213 ug/dL — ABNORMAL LOW (ref 250–450)
UIBC: 161 ug/dL

## 2019-01-29 LAB — FERRITIN: Ferritin: 224 ng/mL (ref 11–307)

## 2019-01-29 MED ORDER — EPOETIN ALFA-EPBX 10000 UNIT/ML IJ SOLN
10000.0000 [IU] | INTRAMUSCULAR | Status: DC
  Administered 2019-01-29: 13:00:00 10000 [IU] via SUBCUTANEOUS
  Filled 2019-01-29: qty 1

## 2019-02-03 ENCOUNTER — Telehealth (INDEPENDENT_AMBULATORY_CARE_PROVIDER_SITE_OTHER): Payer: Self-pay

## 2019-02-04 ENCOUNTER — Other Ambulatory Visit: Payer: Self-pay | Admitting: Cardiology

## 2019-02-04 NOTE — Progress Notes (Signed)
[facility] Note  02/05/2019     CHIEF COMPLAINT Patient presents for Retina Follow Up   HISTORY OF PRESENT ILLNESS: [name] is a 68 y.o. female who presents to the clinic today for:   HPI    Retina Follow Up    Patient presents with  Diabetic Retinopathy.  In both eyes.  This started 3 months ago.  Severity is mild.  Since onset it is gradually improving.  I, the attending physician,  performed the HPI with the patient and updated documentation appropriately.          Comments    F/U NPDR OU. Patient states her vision has improved Os since cataract sx 2 weeks ago. BS 176 this am , pt reports eating donuts last night.        Last edited by [name], MD on 02/05/2019 12:41 PM. (History)    pt states she is doing well, she states her blood pressure has been under control. Had CE with IOL OS since last visit   Referring physician: [name], MD [place],  [place] [address]  HISTORICAL INFORMATION:   Selected notes from the MEDICAL RECORD NUMBER Referred by Dr. [name] for concern of BRVO OS LEE: 10.03.19 (C. Weaver) [BCVA: OD: 20/30 OS: 20/50] Ocular Hx-cataracts OU, HTR OU, NPDR OU, DES PMH-DM (N0U: 7.3, takes Trulicity and [place]), HTN, HLD     CURRENT MEDICATIONS: No current outpatient medications on file. (Ophthalmic Drugs)   No current facility-administered medications for this visit.  (Ophthalmic Drugs)   Current Outpatient Medications (Other)  Medication Sig  . Blood Glucose Monitoring Suppl (ONE TOUCH ULTRA 2) w/Device KIT Use to check BS BID-QID Dx:E11.9  . BREO ELLIPTA 100-25 MCG/INH AEPB INHALE 1 PUFF BY MOUTH ONCE DAILY (Patient taking differently: Inhale 1 puff into the lungs daily. )  . calcitRIOL (ROCALTROL) 0.5 MCG capsule Take 0.5 mcg by mouth daily.  Marland Kitchen CINNAMON PO Take 350 mg by mouth daily.   . Dulaglutide (TRULICITY) 7.25 DG/6.4QI SOPN Inject 0.75 mg into the skin every  Wednesday.  . furosemide (LASIX) 20 MG tablet Take 1 tablet by mouth once daily  . gabapentin (NEURONTIN) 300 MG capsule TAKE 1 CAPSULE BY MOUTH THREE TIMES DAILY  . glucose blood (ONE TOUCH ULTRA TEST) test strip USE AS DIRECTED TO MONITOR  FSBS 3 TIMES DAILY  . hydrALAZINE (APRESOLINE) 25 MG tablet Take 1 tablet (25 mg total) by mouth 3 (three) times daily.  . isosorbide mononitrate (IMDUR) 30 MG 24 hr tablet Take 1 tablet by mouth once daily  . Lancets (ONETOUCH ULTRASOFT) lancets Use as instructed  . linagliptin (TRADJENTA) 5 MG TABS tablet Take 1 tablet (5 mg total) by mouth daily.  . metoprolol succinate (TOPROL-XL) 100 MG 24 hr tablet Take 1 tablet (100 mg total) by mouth at bedtime. Take with or immediately following a meal.  . PROAIR HFA 108 (90 Base) MCG/ACT inhaler Inhale 2 puffs into the lungs every 6 (six) hours as needed for wheezing or shortness of breath.  . rosuvastatin (CRESTOR) 20 MG tablet Take 1 tablet (20 mg total) by mouth daily. (Patient taking differently: Take 20 mg by mouth every evening. )  . sodium bicarbonate 650 MG tablet Take 1 tablet (650 mg total) by mouth 2 (two) times daily.  . solifenacin (VESICARE) 5 MG tablet Take 5 mg by mouth daily.  . TRESIBA FLEXTOUCH 100 UNIT/ML SOPN FlexTouch Pen INJECT  150 UNITS SUBCUTANEOUSLY ONCE DAILY  . VITAMIN A PO Take 2,400 Units by mouth daily.  . Insulin Pen Needle (LIVE BETTER PEN NEEDLES) 31G X 6 MM MISC To use with Tresiba pens daily   Current Facility-Administered Medications (Other)  Medication Route  . Bevacizumab (AVASTIN) SOLN 1.25 mg Intravitreal  . Bevacizumab (AVASTIN) SOLN 1.25 mg Intravitreal  . Bevacizumab (AVASTIN) SOLN 1.25 mg Intravitreal  . Bevacizumab (AVASTIN) SOLN 1.25 mg Intravitreal  . Bevacizumab (AVASTIN) SOLN 1.25 mg Intravitreal      REVIEW OF SYSTEMS: ROS    Positive for: Endocrine, Eyes   Negative for: Constitutional, Gastrointestinal, Neurological, Skin, Genitourinary, Musculoskeletal,  HENT, Cardiovascular, Respiratory, Psychiatric, Allergic/Imm, Heme/Lymph   Last edited by Zenovia Jordan, LPN on 7/42/5956 38:75 AM. (History)       ALLERGIES Allergies  Allergen Reactions  . Penicillins Other (See Comments)    UNSPECIFIED CHILDHOOD REACTION  Has patient had a PCN reaction causing immediate rash, facial/tongue/throat swelling, SOB or lightheadedness with hypotension: Unknown Has patient had a PCN reaction causing severe rash involving mucus membranes or skin necrosis: Unknown Has patient had a PCN reaction that required hospitalization: Unknown Has patient had a PCN reaction occurring within the last 10 years: Unknown If all of the above answers are "NO", then may proceed with Cephalosporin use.   . Adhesive [Tape] Rash    PAST MEDICAL HISTORY Past Medical History:  Diagnosis Date  . Anemia   . Anemia associated with chronic renal failure   . Anemia in chronic kidney disease 09/29/2018  . Arthritis   . Asthma   . CKD stage 3 due to type 2 diabetes mellitus ([place])   . Coronary artery calcification seen on CAT scan 02/17/2018   Coronary calcification on CT  . Depression   . Diabetic retinopathy of both eyes ([place])   . Diabetic ulcer of right foot associated with type 2 diabetes mellitus ([place])   . Essential hypertension 02/17/2018   Essential hypertension  . GERD (gastroesophageal reflux disease)    pt denies  . HLD (hyperlipidemia)   . Hypertension   . Left ventricular dysfunction 04/07/2018   Left ventricle dysfunction  . Microalbuminuria due to type 2 diabetes mellitus ([place])   . Neuromuscular disorder ([place])    diabetic neuropathy  . Nonproliferative retinopathy due to secondary diabetes ([place])   . Nonsustained ventricular tachycardia ([place]) 08/28/2018   Nonsustained ventricular tachycardia  . Pneumonia    hx of   . Renal cell cancer ([place])   . Right renal mass 03/10/2017  . Type 2 diabetes, controlled, with neuropathy ([place]) 06/22/2013  . Ulcer of other part  of foot 06/22/2013  . 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)    Past Surgical History:  Procedure Laterality Date  . BASCILIC VEIN TRANSPOSITION Left 08/08/2017   Procedure: BASILIC VEIN TRANSPOSITION FIRST STAGE LEFT ARM;  Surgeon: Serafina Mitchell, MD;  Location: [facility];  Service: Vascular;  Laterality: Left;  . BASCILIC VEIN TRANSPOSITION Left 05/14/2018   Procedure: SECOND STAGE BASILIC VEIN TRANSPOSITION LEFT ARM;  Surgeon: Serafina Mitchell, MD;  Location: [facility];  Service: Vascular;  Laterality: Left;  . ROBOTIC ADRENALECTOMY Left 03/10/2017   Procedure: XI ROBOTIC ADRENALECTOMY;  Surgeon: Nickie Retort, MD;  Location: [facility];  Service: Urology;  Laterality: Left;  . ROBOTIC ASSITED PARTIAL NEPHRECTOMY Right 03/10/2017   Procedure: XI ROBOTIC ASSITED RADICAL NEPHRECTOMY;  Surgeon: Nickie Retort, MD;  Location: [facility];  Service: Urology;  Laterality: Right;  .  TOE AMPUTATION Bilateral    both great toe ,, left foot 2nd toe 1/2    FAMILY HISTORY Family History  Problem Relation Age of Onset  . Heart disease Mother   . Diabetes Sister     SOCIAL HISTORY Social History   Tobacco Use  . Smoking status: Never Smoker  . Smokeless tobacco: Never Used  Substance Use Topics  . Alcohol use: Yes    Alcohol/week: 0.0 standard drinks    Comment: occ  . Drug use: No         OPHTHALMIC EXAM:  Base Eye Exam    Visual Acuity (Snellen - Linear)      Right Left   Dist cc 20/50 +2 20/40   Dist ph cc 20/30 +1 20/25 +2       Tonometry (Tonopen, 10:08 AM)      Right Left   Pressure 15 13       Pupils      Dark Light Shape React APD   Right 3 2 Round Brisk None   Left 3 2 Round Brisk None       Visual Fields (Counting fingers)      Left Right    Full Full       Extraocular Movement      Right Left    Full, Ortho Full, Ortho       Neuro/Psych    Oriented x3: Yes   Mood/Affect: Normal       Dilation    Both eyes: 1.0% Mydriacyl, 2.5%  Phenylephrine @ 10:08 AM        Slit Lamp and Fundus Exam    Slit Lamp Exam      Right Left   Lids/Lashes Dermatochalasis - upper lid, mild MGD Dermatochalasis - upper lid, Telangiectasia, mild MGD   Conjunctiva/Sclera White and quiet White and quiet   Cornea 1+ Punctate epithelial erosions, mild Arcus Well-healed temporal cataract wound   Anterior Chamber Deep and quiet Deep, 1/2+ cell pigment   Iris Round and dilated, No NVI Round and well dilated   Lens 2-3+ Nuclear sclerosis with early brunescence, 3+ Cortical cataract mild PCIOL in good position   Vitreous Vitreous syneresis, Posterior vitreous detachment Vitreous syneresis       Fundus Exam      Right Left   Disc Pink and Sharp Pink and Sharp   C/D Ratio 0.3 0.2   Macula Blunted foveal reflex, scattered MA and IRH, +cystic changes nasal macula Blunted foveal reflex, scattered DBH, small cluster of DBH and CWS IN macula - mildly improved   Vessels Vascular attenuation, Tortuous Tortuous, Vascular attenuation   Periphery Attached, scattered DBH and small CWS nasal to disc - improving Attached, scattered MA/DBH          IMAGING AND PROCEDURES  Imaging and Procedures for _0 @  OCT, Retina - OU - Both Eyes       Right Eye Quality was good. Central Foveal Thickness: 244. Progression has been stable. Findings include normal foveal contour, no SRF, intraretinal fluid (Mild Interval decrease in non-central IRF nasal macula).   Left Eye Quality was good. Central Foveal Thickness: 237. Progression has worsened. Findings include normal foveal contour, intraretinal fluid, no SRF, vitreomacular adhesion , intraretinal hyper-reflective material (Mild interval increase in non-central IRF, inferonasal to fovea).   Notes *Images captured and stored on drive  Diagnosis / Impression:  NFP; +IRF; no SRF OU noncentral DME OU (OS > OD) OD - persistent IRF nasal macula OS -  Mild interval increase in non-central IRF, inferonasal to  fovea  Clinical management:  See below  Abbreviations: NFP - Normal foveal profile. CME - cystoid macular edema. PED - pigment epithelial detachment. IRF - intraretinal fluid. SRF - subretinal fluid. EZ - ellipsoid zone. ERM - epiretinal membrane. ORA - outer retinal atrophy. ORT - outer retinal tubulation. SRHM - subretinal hyper-reflective material        Intravitreal Injection, Pharmacologic Agent - OS - Left Eye       Time Out 02/05/2019. 11:52 AM. Confirmed correct patient, procedure, site, and patient consented.   Anesthesia Topical anesthesia was used. Anesthetic medications included Lidocaine 2%, Proparacaine 0.5%.   Procedure Preparation included 5% betadine to ocular surface, eyelid speculum. A supplied needle was used.   Injection:  1.25 mg Bevacizumab (AVASTIN) SOLN   NDC: 40973-532-99, Lot: [id number] , Expiration date: 03/31/2019   Route: Intravitreal, Site: Left Eye, Waste: 0 mL  Post-op Post injection exam found visual acuity of at least counting fingers. The patient tolerated the procedure well. There were no complications. The patient received written and verbal post procedure care education.                 ASSESSMENT/PLAN:    ICD-10-CM   1. Moderate nonproliferative diabetic retinopathy of both eyes with macular edema associated with type 2 diabetes mellitus (HCC)  M42.6834 Intravitreal Injection, Pharmacologic Agent - OS - Left Eye    Bevacizumab (AVASTIN) SOLN 1.25 mg  2. Retinal edema  H35.81 OCT, Retina - OU - Both Eyes  3. Essential hypertension  I10   4. Hypertensive retinopathy of both eyes  H35.033   5. Combined forms of age-related cataract of right eye  H25.811   6. Pseudophakia  Z96.1     1,2 Moderate non-proliferative diabetic retinopathy w/ DME OU (OS > OD)  - Initial exam shows scattered MA, BCVA 20/50 OS  - Initial FA (05/12/18) shows leaking MA, no NV OS  - Initial OCT showed diabetic macular edema OU (OS > OD)   - s/p IVA #1 OS  (10.22.19), #2 (11.20.19), #3 (12.20.19), #4 (01.31.20), #5 (03.13.20), #6 (04.22.20), #7 (06.05.20)  - today BCVA improved to 20/25 OS, slightly worse at 20/30 OD -- trending down mostly from cataract progression  - OCT w/ mild interval increase in non-central IRF, inferonasal to fovea OS, OD shows persistent IRF nasal macula  - recommend IVA #8 OS today, 07.17.20  - discussed possible need for IVA OD in the future  - pt wishes to proceed  - RBA of procedure discussed, questions answered  - informed consent obtained and signed  - see procedure note  - f/u in 6 wks, DFE, OCT, possible injxn OU  3,4. Hypertensive retinopathy OU  - discussed importance of tight BP control  - monitor  5. Combined Form Cataracts OD  - The symptoms of cataract, surgical options, and treatments and risks were discussed with patient.  - discussed diagnosis and progression  - visually significant OD  - under the expert management of Dr. [name]  - clear from a retina standpoint to proceed with cataract surgery when pt and surgeon are ready  - recommend coordinating care so that pt receives anti-VEGF 1-2 wks prior to cataract surgery, if possible  - patient states she's waiting to schedule surgery based on finances  6. Pseudophakia OS  - s/p CE/IOL OS w/ Dr. [name]  - beautiful surgery, doing well, but pt reports spotty compliance with post op drop regimen  -  discussed importance of compliance with medications for post op recovery  - monitor  Ophthalmic Meds Ordered this visit:  Meds ordered this encounter  Medications  . Bevacizumab (AVASTIN) SOLN 1.25 mg       Return 6 weeks, for DFE, OCT.  There are no Patient Instructions on file for this visit.   Explained the diagnoses, plan, and follow up with the patient and they expressed understanding.  Patient expressed understanding of the importance of proper follow up care.   This document serves as a record of services personally performed by Gardiner Sleeper, MD, PhD. It was created on their behalf by Ernest Mallick, OA, an ophthalmic assistant. The creation of this record is the provider's dictation and/or activities during the visit.    Electronically signed by: Ernest Mallick, OA  07.16.2020 1:11 PM    Gardiner Sleeper, M.D., Ph.D. Diseases & Surgery of the Retina and Vitreous [facility]   I have reviewed the above documentation for accuracy and completeness, and I agree with the above. Gardiner Sleeper, M.D., Ph.D. 02/05/19 1:11 PM    Abbreviations: M myopia (nearsighted); A astigmatism; H hyperopia (farsighted); P presbyopia; Mrx spectacle prescription;  CTL contact lenses; OD right eye; OS left eye; OU both eyes  XT exotropia; ET esotropia; PEK punctate epithelial keratitis; PEE punctate epithelial erosions; DES dry eye syndrome; MGD meibomian gland dysfunction; ATs artificial tears; PFAT's preservative free artificial tears; [place] nuclear sclerotic cataract; PSC posterior subcapsular cataract; ERM epi-retinal membrane; PVD posterior vitreous detachment; RD retinal detachment; DM diabetes mellitus; DR diabetic retinopathy; NPDR non-proliferative diabetic retinopathy; PDR proliferative diabetic retinopathy; CSME clinically significant macular edema; DME diabetic macular edema; dbh dot blot hemorrhages; CWS cotton wool spot; POAG primary open angle glaucoma; C/D cup-to-disc ratio; HVF humphrey visual field; GVF goldmann visual field; OCT optical coherence tomography; IOP intraocular pressure; BRVO Branch retinal vein occlusion; CRVO central retinal vein occlusion; CRAO central retinal artery occlusion; BRAO branch retinal artery occlusion; RT retinal tear; SB scleral buckle; PPV pars plana vitrectomy; VH Vitreous hemorrhage; PRP panretinal laser photocoagulation; IVK intravitreal kenalog; VMT vitreomacular traction; MH Macular hole;  NVD neovascularization of the disc; NVE neovascularization elsewhere; AREDS age related eye  disease study; ARMD age related macular degeneration; POAG primary open angle glaucoma; EBMD epithelial/anterior basement membrane dystrophy; ACIOL anterior chamber intraocular lens; IOL intraocular lens; PCIOL posterior chamber intraocular lens; Phaco/IOL phacoemulsification with intraocular lens placement; [place] photorefractive keratectomy; LASIK laser assisted in situ keratomileusis; HTN hypertension; DM diabetes mellitus; COPD chronic obstructive pulmonary disease

## 2019-02-05 ENCOUNTER — Other Ambulatory Visit: Payer: Self-pay

## 2019-02-05 ENCOUNTER — Telehealth: Payer: Self-pay | Admitting: Family Medicine

## 2019-02-05 ENCOUNTER — Encounter (INDEPENDENT_AMBULATORY_CARE_PROVIDER_SITE_OTHER): Payer: Self-pay | Admitting: Ophthalmology

## 2019-02-05 ENCOUNTER — Ambulatory Visit (INDEPENDENT_AMBULATORY_CARE_PROVIDER_SITE_OTHER): Payer: Medicare Other | Admitting: Ophthalmology

## 2019-02-05 DIAGNOSIS — H25811 Combined forms of age-related cataract, right eye: Secondary | ICD-10-CM | POA: Diagnosis not present

## 2019-02-05 DIAGNOSIS — I10 Essential (primary) hypertension: Secondary | ICD-10-CM

## 2019-02-05 DIAGNOSIS — H35033 Hypertensive retinopathy, bilateral: Secondary | ICD-10-CM

## 2019-02-05 DIAGNOSIS — Z961 Presence of intraocular lens: Secondary | ICD-10-CM

## 2019-02-05 DIAGNOSIS — H3581 Retinal edema: Secondary | ICD-10-CM | POA: Diagnosis not present

## 2019-02-05 DIAGNOSIS — E113313 Type 2 diabetes mellitus with moderate nonproliferative diabetic retinopathy with macular edema, bilateral: Secondary | ICD-10-CM | POA: Diagnosis not present

## 2019-02-05 MED ORDER — BEVACIZUMAB CHEMO INJECTION 1.25MG/0.05ML SYRINGE FOR KALEIDOSCOPE
1.2500 mg | INTRAVITREAL | Status: AC | PRN
  Administered 2019-02-05: 1.25 mg via INTRAVITREAL

## 2019-02-05 MED ORDER — LIVE BETTER PEN NEEDLES 31G X 6 MM MISC: 3 refills | Status: AC

## 2019-02-05 NOTE — Telephone Encounter (Signed)
Pt needs refill on [place] pen needles to [facility] [address]

## 2019-02-05 NOTE — Telephone Encounter (Signed)
Medication called/sent to requested pharmacy  

## 2019-02-11 DIAGNOSIS — I129 Hypertensive chronic kidney disease with stage 1 through stage 4 chronic kidney disease, or unspecified chronic kidney disease: Secondary | ICD-10-CM | POA: Diagnosis not present

## 2019-02-11 DIAGNOSIS — E1122 Type 2 diabetes mellitus with diabetic chronic kidney disease: Secondary | ICD-10-CM | POA: Diagnosis not present

## 2019-02-11 DIAGNOSIS — N184 Chronic kidney disease, stage 4 (severe): Secondary | ICD-10-CM | POA: Diagnosis not present

## 2019-02-11 DIAGNOSIS — N2581 Secondary hyperparathyroidism of renal origin: Secondary | ICD-10-CM | POA: Diagnosis not present

## 2019-02-11 DIAGNOSIS — D509 Iron deficiency anemia, unspecified: Secondary | ICD-10-CM | POA: Diagnosis not present

## 2019-02-11 DIAGNOSIS — D631 Anemia in chronic kidney disease: Secondary | ICD-10-CM | POA: Diagnosis not present

## 2019-02-12 ENCOUNTER — Other Ambulatory Visit: Payer: Self-pay

## 2019-02-12 ENCOUNTER — Encounter (HOSPITAL_COMMUNITY)
Admission: RE | Admit: 2019-02-12 | Discharge: 2019-02-12 | Disposition: A | Discharge status: Home / Self Care | Payer: Medicare Other | Source: Ambulatory Visit | Attending: Nephrology | Admitting: Nephrology

## 2019-02-12 VITALS — BP 114/58 | HR 72 | Temp 97.7°F | Resp 20

## 2019-02-12 DIAGNOSIS — D631 Anemia in chronic kidney disease: Secondary | ICD-10-CM | POA: Insufficient documentation

## 2019-02-12 DIAGNOSIS — N184 Chronic kidney disease, stage 4 (severe): Secondary | ICD-10-CM | POA: Insufficient documentation

## 2019-02-12 LAB — POCT HEMOGLOBIN-HEMACUE: Hemoglobin: 10.4 g/dL — ABNORMAL LOW (ref 12.0–15.0)

## 2019-02-12 MED ORDER — EPOETIN ALFA-EPBX 10000 UNIT/ML IJ SOLN
10000.0000 [IU] | INTRAMUSCULAR | Status: DC
  Administered 2019-02-12: 10000 [IU] via SUBCUTANEOUS
  Filled 2019-02-12: qty 1

## 2019-02-26 ENCOUNTER — Ambulatory Visit (HOSPITAL_COMMUNITY)
Admission: RE | Admit: 2019-02-26 | Discharge: 2019-02-26 | Disposition: A | Discharge status: Home / Self Care | Payer: Medicare Other | Source: Ambulatory Visit | Attending: Nephrology | Admitting: Nephrology

## 2019-02-26 ENCOUNTER — Other Ambulatory Visit: Payer: Self-pay

## 2019-02-26 VITALS — BP 115/64 | HR 67 | Temp 97.7°F | Resp 20

## 2019-02-26 DIAGNOSIS — N184 Chronic kidney disease, stage 4 (severe): Secondary | ICD-10-CM | POA: Diagnosis not present

## 2019-02-26 DIAGNOSIS — D631 Anemia in chronic kidney disease: Secondary | ICD-10-CM | POA: Diagnosis not present

## 2019-02-26 LAB — IRON AND TIBC
Iron: 60 ug/dL (ref 28–170)
Saturation Ratios: 26 % (ref 10.4–31.8)
TIBC: 227 ug/dL — ABNORMAL LOW (ref 250–450)
UIBC: 167 ug/dL

## 2019-02-26 LAB — FERRITIN: Ferritin: 193 ng/mL (ref 11–307)

## 2019-02-26 LAB — POCT HEMOGLOBIN-HEMACUE: Hemoglobin: 10.7 g/dL — ABNORMAL LOW (ref 12.0–15.0)

## 2019-02-26 MED ORDER — EPOETIN ALFA-EPBX 10000 UNIT/ML IJ SOLN
10000.0000 [IU] | INTRAMUSCULAR | Status: DC
  Administered 2019-02-26: 10000 [IU] via SUBCUTANEOUS
  Filled 2019-02-26: qty 1

## 2019-03-10 ENCOUNTER — Other Ambulatory Visit: Payer: Self-pay | Admitting: Family Medicine

## 2019-03-12 ENCOUNTER — Ambulatory Visit (HOSPITAL_COMMUNITY)
Admission: RE | Admit: 2019-03-12 | Discharge: 2019-03-12 | Disposition: A | Discharge status: Home / Self Care | Payer: Medicare Other | Source: Ambulatory Visit | Attending: Nephrology | Admitting: Nephrology

## 2019-03-12 VITALS — BP 124/71 | HR 69 | Temp 96.9°F | Resp 20

## 2019-03-12 DIAGNOSIS — N184 Chronic kidney disease, stage 4 (severe): Secondary | ICD-10-CM | POA: Diagnosis not present

## 2019-03-12 DIAGNOSIS — D631 Anemia in chronic kidney disease: Secondary | ICD-10-CM | POA: Diagnosis not present

## 2019-03-12 LAB — POCT HEMOGLOBIN-HEMACUE: Hemoglobin: 10.6 g/dL — ABNORMAL LOW (ref 12.0–15.0)

## 2019-03-12 MED ORDER — EPOETIN ALFA-EPBX 10000 UNIT/ML IJ SOLN
10000.0000 [IU] | INTRAMUSCULAR | Status: DC
  Administered 2019-03-12: 13:00:00 10000 [IU] via SUBCUTANEOUS
  Filled 2019-03-12: qty 1

## 2019-03-18 NOTE — Progress Notes (Signed)
[facility] Note  03/19/2019     CHIEF COMPLAINT Patient presents for Retina Follow Up   HISTORY OF PRESENT ILLNESS: [name] is a 68 y.o. female who presents to the clinic today for:   HPI    Retina Follow Up    Patient presents with  Diabetic Retinopathy.  In both eyes.  This started months ago.  Severity is moderate.  Duration of 6 weeks.  Since onset it is stable.  I, the attending physician,  performed the HPI with the patient and updated documentation appropriately.          Comments    68 y/o female pt here for 6 wk f/u for NPDR OU.  No change in [name] OU.  Denies pain, flashes, floaters.  AT prn OU.  BS 115 this a.m.  A1C unknown.       Last edited by [name], MD on 03/19/2019 12:57 PM. (History)    pt states she is doing well, no new updates on cataract sx OD, she states she is still paying on the left eye   Referring physician: [name], MD [address] [place],  [place] [id number]  HISTORICAL INFORMATION:   Selected notes from the MEDICAL RECORD NUMBER Referred by Dr. [name] for concern of BRVO OS LEE: 10.03.19 (C. Weaver) [BCVA: OD: 20/30 OS: 20/50] Ocular Hx-cataracts OU, HTR OU, NPDR OU, DES PMH-DM (J4H: 7.3, takes Trulicity and [place]), HTN, HLD     CURRENT MEDICATIONS: No current outpatient medications on file. (Ophthalmic Drugs)   No current facility-administered medications for this visit.  (Ophthalmic Drugs)   Current Outpatient Medications (Other)  Medication Sig  . Blood Glucose Monitoring Suppl (ONE TOUCH ULTRA 2) w/Device KIT Use to check BS BID-QID Dx:E11.9  . BREO ELLIPTA 100-25 MCG/INH AEPB INHALE 1 PUFF BY MOUTH ONCE DAILY (Patient taking differently: Inhale 1 puff into the lungs daily. )  . calcitRIOL (ROCALTROL) 0.5 MCG capsule Take 0.5 mcg by mouth daily.  Marland Kitchen CINNAMON PO Take 350 mg by mouth daily.   . Dulaglutide (TRULICITY) 7.02 OV/7.8HY SOPN Inject 0.75 mg into the skin every  Wednesday.  . furosemide (LASIX) 20 MG tablet Take 1 tablet by mouth once daily  . gabapentin (NEURONTIN) 300 MG capsule TAKE 1 CAPSULE BY MOUTH THREE TIMES DAILY  . glucose blood (ONE TOUCH ULTRA TEST) test strip USE AS DIRECTED TO MONITOR  FSBS 3 TIMES DAILY  . hydrALAZINE (APRESOLINE) 25 MG tablet Take 1 tablet (25 mg total) by mouth 3 (three) times daily.  . Insulin Pen Needle (LIVE BETTER PEN NEEDLES) 31G X 6 MM MISC To use with Tresiba pens daily  . isosorbide mononitrate (IMDUR) 30 MG 24 hr tablet Take 1 tablet by mouth once daily  . Lancets (ONETOUCH ULTRASOFT) lancets Use as instructed  . linagliptin (TRADJENTA) 5 MG TABS tablet Take 1 tablet (5 mg total) by mouth daily.  . metoprolol succinate (TOPROL-XL) 100 MG 24 hr tablet Take 1 tablet (100 mg total) by mouth at bedtime. Take with or immediately following a meal.  . PROAIR HFA 108 (90 Base) MCG/ACT inhaler Inhale 2 puffs into the lungs every 6 (six) hours as needed for wheezing or shortness of breath.  . rosuvastatin (CRESTOR) 20 MG tablet Take 1 tablet (20 mg total) by mouth daily. (Patient taking differently: Take 20 mg by mouth every evening. )  . sodium bicarbonate 650 MG tablet Take 1 tablet (650  mg total) by mouth 2 (two) times daily.  . solifenacin (VESICARE) 5 MG tablet Take 5 mg by mouth daily.  . TRESIBA FLEXTOUCH 100 UNIT/ML SOPN FlexTouch Pen INJECT 150 UNITS SUBCUTANEOUSLY ONCE DAILY  . VITAMIN A PO Take 2,400 Units by mouth daily.   Current Facility-Administered Medications (Other)  Medication Route  . Bevacizumab (AVASTIN) SOLN 1.25 mg Intravitreal  . Bevacizumab (AVASTIN) SOLN 1.25 mg Intravitreal  . Bevacizumab (AVASTIN) SOLN 1.25 mg Intravitreal  . Bevacizumab (AVASTIN) SOLN 1.25 mg Intravitreal  . Bevacizumab (AVASTIN) SOLN 1.25 mg Intravitreal      REVIEW OF SYSTEMS: ROS    Positive for: Genitourinary, Endocrine, Cardiovascular, Eyes   Negative for: Constitutional, Gastrointestinal, Neurological, Skin,  Musculoskeletal, HENT, Respiratory, Psychiatric, Allergic/Imm, Heme/Lymph   Last edited by Matthew Folks, COA on 03/19/2019 10:08 AM. (History)       ALLERGIES Allergies  Allergen Reactions  . Penicillins Other (See Comments)    UNSPECIFIED CHILDHOOD REACTION  Has patient had a PCN reaction causing immediate rash, facial/tongue/throat swelling, SOB or lightheadedness with hypotension: Unknown Has patient had a PCN reaction causing severe rash involving mucus membranes or skin necrosis: Unknown Has patient had a PCN reaction that required hospitalization: Unknown Has patient had a PCN reaction occurring within the last 10 years: Unknown If all of the above answers are "NO", then may proceed with Cephalosporin use.   . Adhesive [Tape] Rash    PAST MEDICAL HISTORY Past Medical History:  Diagnosis Date  . Anemia   . Anemia associated with chronic renal failure   . Anemia in chronic kidney disease 09/29/2018  . Arthritis   . Asthma   . Cataract    OD  . CKD stage 3 due to type 2 diabetes mellitus ([place])   . Coronary artery calcification seen on CAT scan 02/17/2018   Coronary calcification on CT  . Depression   . Diabetic retinopathy of both eyes ([place])   . Diabetic ulcer of right foot associated with type 2 diabetes mellitus ([place])   . Essential hypertension 02/17/2018   Essential hypertension  . GERD (gastroesophageal reflux disease)    pt denies  . HLD (hyperlipidemia)   . Hypertension   . Hypertensive retinopathy    OU  . Left ventricular dysfunction 04/07/2018   Left ventricle dysfunction  . Microalbuminuria due to type 2 diabetes mellitus ([place])   . Neuromuscular disorder ([place])    diabetic neuropathy  . Nonproliferative retinopathy due to secondary diabetes ([place])   . Nonsustained ventricular tachycardia ([place]) 08/28/2018   Nonsustained ventricular tachycardia  . Pneumonia    hx of   . Renal cell cancer ([place])   . Right renal mass 03/10/2017  . Type 2 diabetes, controlled,  with neuropathy ([place]) 06/22/2013  . Ulcer of other part of foot 06/22/2013  . 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)    Past Surgical History:  Procedure Laterality Date  . BASCILIC VEIN TRANSPOSITION Left 08/08/2017   Procedure: BASILIC VEIN TRANSPOSITION FIRST STAGE LEFT ARM;  Surgeon: Serafina Mitchell, MD;  Location: [place];  Service: Vascular;  Laterality: Left;  . BASCILIC VEIN TRANSPOSITION Left 05/14/2018   Procedure: SECOND STAGE BASILIC VEIN TRANSPOSITION LEFT ARM;  Surgeon: Serafina Mitchell, MD;  Location: [place];  Service: Vascular;  Laterality: Left;  . CATARACT EXTRACTION Left   . EYE SURGERY    . ROBOTIC ADRENALECTOMY Left 03/10/2017   Procedure: XI ROBOTIC ADRENALECTOMY;  Surgeon: Nickie Retort, MD;  Location: [facility];  Service: Urology;  Laterality: Left;  . ROBOTIC ASSITED PARTIAL NEPHRECTOMY Right 03/10/2017   Procedure: XI ROBOTIC ASSITED RADICAL NEPHRECTOMY;  Surgeon: Nickie Retort, MD;  Location: [facility];  Service: Urology;  Laterality: Right;  . TOE AMPUTATION Bilateral    both great toe ,, left foot 2nd toe 1/2    FAMILY HISTORY Family History  Problem Relation Age of Onset  . Heart disease Mother   . Diabetes Sister     SOCIAL HISTORY Social History   Tobacco Use  . Smoking status: Never Smoker  . Smokeless tobacco: Never Used  Substance Use Topics  . Alcohol use: Yes    Alcohol/week: 0.0 standard drinks    Comment: occ  . Drug use: No         OPHTHALMIC EXAM:  Base Eye Exam    Visual Acuity (Snellen - Linear)      Right Left   Dist [place] 20/25 -2 20/30   Dist ph [place] NI 20/25 -2       Tonometry (Tonopen, 10:11 AM)      Right Left   Pressure 12 13       Pupils      Dark Light Shape React APD   Right 3 2 Round Brisk None   Left 3 2 Round Brisk None       Visual Fields (Counting fingers)      Left Right    Full Full       Extraocular Movement      Right Left    Full, Ortho Full, Ortho       Neuro/Psych     Oriented x3: Yes   Mood/Affect: Normal       Dilation    Both eyes: 1.0% Mydriacyl, 2.5% Phenylephrine @ 10:11 AM        Slit Lamp and Fundus Exam    Slit Lamp Exam      Right Left   Lids/Lashes Dermatochalasis - upper lid, mild MGD Dermatochalasis - upper lid, Telangiectasia, mild MGD   Conjunctiva/Sclera White and quiet White and quiet   Cornea 1+ Punctate epithelial erosions, mild Arcus Well-healed temporal cataract wound   Anterior Chamber Deep and quiet Deep   Iris Round and dilated, No NVI Round and well dilated   Lens 2-3+ Nuclear sclerosis with early brunescence, 3+ Cortical cataract mild PCIOL in good position   Vitreous Vitreous syneresis, Posterior vitreous detachment Vitreous syneresis       Fundus Exam      Right Left   Disc Pink and Sharp Pink and Sharp   C/D Ratio 0.3 0.2   Macula Blunted foveal reflex, scattered MA and IRH greatest nasally, +cystic changes nasal macula Blunted foveal reflex, scattered DBH, small cluster of DBH IN macula - mildly improved   Vessels Vascular attenuation, Tortuous Tortuous, Vascular attenuation   Periphery Attached, scattered DBH and small CWS nasal to disc - improving Attached, scattered MA/DBH          IMAGING AND PROCEDURES  Imaging and Procedures for _0 @  OCT, Retina - OU - Both Eyes       Right Eye Quality was good. Central Foveal Thickness: 247. Progression has been stable. Findings include normal foveal contour, no SRF, intraretinal fluid (Mild Interval increase in non-central cystic changes nasal macula).   Left Eye Quality was good. Central Foveal Thickness: 256. Progression has improved. Findings include normal foveal contour, intraretinal fluid, no SRF, vitreomacular adhesion , intraretinal hyper-reflective material (Mild interval improvement  in non-central IRF, inferonasal to fovea).   Notes *Images captured and stored on drive  Diagnosis / Impression:  NFP; +IRF; no SRF OU noncentral DME OU (OS >  OD) OD - Mild Interval increase in non-central cystic changes nasal macula OS - Mild interval improvement in non-central IRF, inferonasal to fovea  Clinical management:  See below  Abbreviations: NFP - Normal foveal profile. CME - cystoid macular edema. PED - pigment epithelial detachment. IRF - intraretinal fluid. SRF - subretinal fluid. EZ - ellipsoid zone. ERM - epiretinal membrane. ORA - outer retinal atrophy. ORT - outer retinal tubulation. SRHM - subretinal hyper-reflective material        Intravitreal Injection, Pharmacologic Agent - OS - Left Eye       Time Out 03/19/2019. 10:16 AM. Confirmed correct patient, procedure, site, and patient consented.   Anesthesia Topical anesthesia was used. Anesthetic medications included Lidocaine 2%, Proparacaine 0.5%.   Procedure Preparation included 5% betadine to ocular surface, eyelid speculum. A supplied needle was used.   Injection:  1.25 mg Bevacizumab (AVASTIN) SOLN   NDC: 66599-357-01, Lot: [id number] , Expiration date: 05/05/2019   Route: Intravitreal, Site: Left Eye, Waste: 0 mL  Post-op Post injection exam found visual acuity of at least counting fingers. The patient tolerated the procedure well. There were no complications. The patient received written and verbal post procedure care education.                 ASSESSMENT/PLAN:    ICD-10-CM   1. Moderate nonproliferative diabetic retinopathy of both eyes with macular edema associated with type 2 diabetes mellitus (HCC)  X79.3903 Intravitreal Injection, Pharmacologic Agent - OS - Left Eye    Bevacizumab (AVASTIN) SOLN 1.25 mg  2. Retinal edema  H35.81 OCT, Retina - OU - Both Eyes  3. Essential hypertension  I10   4. Hypertensive retinopathy of both eyes  H35.033   5. Combined forms of age-related cataract of right eye  H25.811   6. Pseudophakia  Z96.1   7. Combined forms of age-related cataract of both eyes  H25.813     1,2 Moderate non-proliferative diabetic  retinopathy w/ DME OU (OS > OD)  - Initial exam shows scattered MA, BCVA 20/50 OS  - Initial FA (05/12/18) shows leaking MA, no NV OS  - Initial OCT showed diabetic macular edema OU (OS > OD)   - s/p IVA #1 OS (10.22.19), #2 (11.20.19), #3 (12.20.19), #4 (01.31.20), #5 (03.13.20), #6 (04.22.20), #7 (06.05.20), #8 (07.17.20)  - today BCVA stable at 20/25 OS, slightly improved to 20/25 from 20/30 OD   - OCT w/ mild interval decrease in non-central IRF, inferonasal to fovea OS, OD shows mild interval increase in non-central cystic changes, inferonasal to fovea  - recommend IVA #9 OS today, 08.28.20  - discussed possible need for IVA OD in the future  - pt wishes to proceed  - RBA of procedure discussed, questions answered  - informed consent obtained and signed  - see procedure note  - f/u in 6 wks, DFE, OCT, possible injxn OU  3,4. Hypertensive retinopathy OU  - discussed importance of tight BP control  - monitor  5. Combined Form Cataracts OD  - The symptoms of cataract, surgical options, and treatments and risks were discussed with patient.  - discussed diagnosis and progression  - visually significant OD  - under the expert management of Dr. [name]  - clear from a retina standpoint to proceed with cataract surgery when pt and surgeon are  ready  - recommend coordinating care so that pt receives anti-VEGF 1-2 wks prior to cataract surgery, if possible  - patient states she's waiting to schedule surgery based on finances  6. Pseudophakia OS  - s/p CE/IOL OS w/ Dr. [name]  - beautiful surgery, doing well, but pt reports spotty compliance with post op drop regimen  - discussed importance of compliance with medications for post op recovery  - monitor   Ophthalmic Meds Ordered this visit:  Meds ordered this encounter  Medications  . Bevacizumab (AVASTIN) SOLN 1.25 mg       Return in about 6 weeks (around 04/30/2019) for DME OS -- Dilated Exam, OCT, Possible Injxn.  There are no  Patient Instructions on file for this visit.   Explained the diagnoses, plan, and follow up with the patient and they expressed understanding.  Patient expressed understanding of the importance of proper follow up care.   This document serves as a record of services personally performed by Gardiner Sleeper, MD, PhD. It was created on their behalf by Ernest Mallick, OA, an ophthalmic assistant. The creation of this record is the provider's dictation and/or activities during the visit.    Electronically signed by: Ernest Mallick, OA  08.27.2020 12:59 PM    Gardiner Sleeper, M.D., Ph.D. Diseases & Surgery of the Retina and Vitreous [facility]   I have reviewed the above documentation for accuracy and completeness, and I agree with the above. Gardiner Sleeper, M.D., Ph.D. 03/19/19 1:00 PM     Abbreviations: M myopia (nearsighted); A astigmatism; H hyperopia (farsighted); P presbyopia; Mrx spectacle prescription;  CTL contact lenses; OD right eye; OS left eye; OU both eyes  XT exotropia; ET esotropia; PEK punctate epithelial keratitis; PEE punctate epithelial erosions; DES dry eye syndrome; MGD meibomian gland dysfunction; ATs artificial tears; PFAT's preservative free artificial tears; [place] nuclear sclerotic cataract; PSC posterior subcapsular cataract; ERM epi-retinal membrane; PVD posterior vitreous detachment; RD retinal detachment; DM diabetes mellitus; DR diabetic retinopathy; NPDR non-proliferative diabetic retinopathy; PDR proliferative diabetic retinopathy; CSME clinically significant macular edema; DME diabetic macular edema; dbh dot blot hemorrhages; CWS cotton wool spot; POAG primary open angle glaucoma; C/D cup-to-disc ratio; HVF humphrey visual field; GVF goldmann visual field; OCT optical coherence tomography; IOP intraocular pressure; BRVO Branch retinal vein occlusion; CRVO central retinal vein occlusion; CRAO central retinal artery occlusion; BRAO branch retinal  artery occlusion; RT retinal tear; SB scleral buckle; PPV pars plana vitrectomy; VH Vitreous hemorrhage; PRP panretinal laser photocoagulation; IVK intravitreal kenalog; VMT vitreomacular traction; MH Macular hole;  NVD neovascularization of the disc; NVE neovascularization elsewhere; AREDS age related eye disease study; ARMD age related macular degeneration; POAG primary open angle glaucoma; EBMD epithelial/anterior basement membrane dystrophy; ACIOL anterior chamber intraocular lens; IOL intraocular lens; PCIOL posterior chamber intraocular lens; Phaco/IOL phacoemulsification with intraocular lens placement; [place] photorefractive keratectomy; LASIK laser assisted in situ keratomileusis; HTN hypertension; DM diabetes mellitus; COPD chronic obstructive pulmonary disease

## 2019-03-19 ENCOUNTER — Encounter (INDEPENDENT_AMBULATORY_CARE_PROVIDER_SITE_OTHER): Payer: Self-pay | Admitting: Ophthalmology

## 2019-03-19 ENCOUNTER — Other Ambulatory Visit: Payer: Self-pay

## 2019-03-19 ENCOUNTER — Ambulatory Visit (INDEPENDENT_AMBULATORY_CARE_PROVIDER_SITE_OTHER): Payer: Medicare Other | Admitting: Ophthalmology

## 2019-03-19 DIAGNOSIS — H35033 Hypertensive retinopathy, bilateral: Secondary | ICD-10-CM | POA: Diagnosis not present

## 2019-03-19 DIAGNOSIS — H3581 Retinal edema: Secondary | ICD-10-CM

## 2019-03-19 DIAGNOSIS — H25811 Combined forms of age-related cataract, right eye: Secondary | ICD-10-CM

## 2019-03-19 DIAGNOSIS — I10 Essential (primary) hypertension: Secondary | ICD-10-CM | POA: Diagnosis not present

## 2019-03-19 DIAGNOSIS — H25813 Combined forms of age-related cataract, bilateral: Secondary | ICD-10-CM

## 2019-03-19 DIAGNOSIS — E113313 Type 2 diabetes mellitus with moderate nonproliferative diabetic retinopathy with macular edema, bilateral: Secondary | ICD-10-CM

## 2019-03-19 DIAGNOSIS — Z961 Presence of intraocular lens: Secondary | ICD-10-CM

## 2019-03-19 MED ORDER — BEVACIZUMAB CHEMO INJECTION 1.25MG/0.05ML SYRINGE FOR KALEIDOSCOPE
1.2500 mg | INTRAVITREAL | Status: AC | PRN
  Administered 2019-03-19: 1.25 mg via INTRAVITREAL

## 2019-03-24 DIAGNOSIS — N2581 Secondary hyperparathyroidism of renal origin: Secondary | ICD-10-CM | POA: Diagnosis not present

## 2019-03-24 DIAGNOSIS — D509 Iron deficiency anemia, unspecified: Secondary | ICD-10-CM | POA: Diagnosis not present

## 2019-03-24 DIAGNOSIS — D631 Anemia in chronic kidney disease: Secondary | ICD-10-CM | POA: Diagnosis not present

## 2019-03-24 DIAGNOSIS — E1122 Type 2 diabetes mellitus with diabetic chronic kidney disease: Secondary | ICD-10-CM | POA: Diagnosis not present

## 2019-03-24 DIAGNOSIS — N184 Chronic kidney disease, stage 4 (severe): Secondary | ICD-10-CM | POA: Diagnosis not present

## 2019-03-26 ENCOUNTER — Encounter (HOSPITAL_COMMUNITY)
Admission: RE | Admit: 2019-03-26 | Discharge: 2019-03-26 | Disposition: A | Discharge status: Home / Self Care | Payer: Medicare Other | Source: Ambulatory Visit | Attending: Nephrology | Admitting: Nephrology

## 2019-03-26 ENCOUNTER — Encounter (HOSPITAL_COMMUNITY): Payer: Medicare Other

## 2019-03-26 ENCOUNTER — Other Ambulatory Visit: Payer: Self-pay

## 2019-03-26 VITALS — BP 121/77 | HR 73 | Temp 97.7°F | Resp 20

## 2019-03-26 DIAGNOSIS — N184 Chronic kidney disease, stage 4 (severe): Secondary | ICD-10-CM | POA: Diagnosis not present

## 2019-03-26 DIAGNOSIS — D631 Anemia in chronic kidney disease: Secondary | ICD-10-CM

## 2019-03-26 LAB — FERRITIN: Ferritin: 162 ng/mL (ref 11–307)

## 2019-03-26 LAB — IRON AND TIBC
Iron: 50 ug/dL (ref 28–170)
Saturation Ratios: 25 % (ref 10.4–31.8)
TIBC: 203 ug/dL — ABNORMAL LOW (ref 250–450)
UIBC: 153 ug/dL

## 2019-03-26 MED ORDER — EPOETIN ALFA-EPBX 10000 UNIT/ML IJ SOLN
10000.0000 [IU] | INTRAMUSCULAR | Status: DC
  Administered 2019-03-26: 10000 [IU] via SUBCUTANEOUS
  Filled 2019-03-26: qty 1

## 2019-03-30 LAB — POCT HEMOGLOBIN-HEMACUE: Hemoglobin: 10.4 g/dL — ABNORMAL LOW (ref 12.0–15.0)

## 2019-04-06 ENCOUNTER — Other Ambulatory Visit: Payer: Self-pay | Admitting: Family Medicine

## 2019-04-09 ENCOUNTER — Encounter (HOSPITAL_COMMUNITY)
Admission: RE | Admit: 2019-04-09 | Discharge: 2019-04-09 | Disposition: A | Discharge status: Home / Self Care | Payer: Medicare Other | Source: Ambulatory Visit | Attending: Nephrology | Admitting: Nephrology

## 2019-04-09 ENCOUNTER — Other Ambulatory Visit: Payer: Self-pay

## 2019-04-09 VITALS — BP 115/77 | HR 71 | Temp 98.0°F

## 2019-04-09 DIAGNOSIS — D631 Anemia in chronic kidney disease: Secondary | ICD-10-CM

## 2019-04-09 DIAGNOSIS — N184 Chronic kidney disease, stage 4 (severe): Secondary | ICD-10-CM | POA: Diagnosis not present

## 2019-04-09 LAB — POCT HEMOGLOBIN-HEMACUE: Hemoglobin: 11.3 g/dL — ABNORMAL LOW (ref 12.0–15.0)

## 2019-04-09 MED ORDER — EPOETIN ALFA-EPBX 10000 UNIT/ML IJ SOLN
10000.0000 [IU] | INTRAMUSCULAR | Status: DC
  Administered 2019-04-09: 10000 [IU] via SUBCUTANEOUS
  Filled 2019-04-09: qty 1

## 2019-04-15 ENCOUNTER — Other Ambulatory Visit: Payer: Self-pay | Admitting: Cardiology

## 2019-04-23 ENCOUNTER — Other Ambulatory Visit: Payer: Self-pay

## 2019-04-23 ENCOUNTER — Ambulatory Visit (HOSPITAL_COMMUNITY)
Admission: RE | Admit: 2019-04-23 | Discharge: 2019-04-23 | Disposition: A | Discharge status: Home / Self Care | Payer: Medicare Other | Source: Ambulatory Visit | Attending: Nephrology | Admitting: Nephrology

## 2019-04-23 VITALS — BP 120/75 | HR 69 | Temp 97.5°F | Resp 18

## 2019-04-23 DIAGNOSIS — D631 Anemia in chronic kidney disease: Secondary | ICD-10-CM | POA: Insufficient documentation

## 2019-04-23 DIAGNOSIS — N184 Chronic kidney disease, stage 4 (severe): Secondary | ICD-10-CM | POA: Insufficient documentation

## 2019-04-23 LAB — IRON AND TIBC
Iron: 54 ug/dL (ref 28–170)
Saturation Ratios: 25 % (ref 10.4–31.8)
TIBC: 214 ug/dL — ABNORMAL LOW (ref 250–450)
UIBC: 160 ug/dL

## 2019-04-23 LAB — POCT HEMOGLOBIN-HEMACUE: Hemoglobin: 10.6 g/dL — ABNORMAL LOW (ref 12.0–15.0)

## 2019-04-23 LAB — FERRITIN: Ferritin: 133 ng/mL (ref 11–307)

## 2019-04-23 MED ORDER — EPOETIN ALFA-EPBX 10000 UNIT/ML IJ SOLN
10000.0000 [IU] | INTRAMUSCULAR | Status: DC
  Administered 2019-04-23: 10000 [IU] via SUBCUTANEOUS
  Filled 2019-04-23: qty 1

## 2019-04-28 NOTE — Progress Notes (Signed)
[facility] Note  04/30/2019     CHIEF COMPLAINT Patient presents for Retina Follow Up   HISTORY OF PRESENT ILLNESS: [name] is a 68 y.o. female who presents to the clinic today for:   HPI    Retina Follow Up    Patient presents with  Diabetic Retinopathy.  In both eyes.  This started 6 weeks ago.  Severity is moderate.  I, the attending physician,  performed the HPI with the patient and updated documentation appropriately.          Comments    Patient here for 6 weeks retina follow up for NPDR with DME OU (OS>OD). Patient states vision doing pretty good. Having stringiness this am. No eye pain.       Last edited by [name], MD on 04/30/2019  4:40 PM. (History)    pt states she cannot tell if her vision is any better since last visit  Referring physician: [name], MD [place],   [id number]  HISTORICAL INFORMATION:   Selected notes from the MEDICAL RECORD NUMBER Referred by Dr. [name] for concern of BRVO OS LEE: 10.03.19 (C. Weaver) [BCVA: OD: 20/30 OS: 20/50] Ocular Hx-cataracts OU, HTR OU, NPDR OU, DES PMH-DM (X1D: 7.3, takes Trulicity and [place]), HTN, HLD     CURRENT MEDICATIONS: No current outpatient medications on file. (Ophthalmic Drugs)   No current facility-administered medications for this visit.  (Ophthalmic Drugs)   Current Outpatient Medications (Other)  Medication Sig  . Blood Glucose Monitoring Suppl (ONE TOUCH ULTRA 2) w/Device KIT Use to check BS BID-QID Dx:E11.9  . BREO ELLIPTA 100-25 MCG/INH AEPB INHALE 1 PUFF BY MOUTH ONCE DAILY (Patient taking differently: Inhale 1 puff into the lungs daily. )  . calcitRIOL (ROCALTROL) 0.5 MCG capsule Take 0.5 mcg by mouth daily.  Marland Kitchen CINNAMON PO Take 350 mg by mouth daily.   . Dulaglutide (TRULICITY) 5.52 CE/0.2MV SOPN Inject 0.75 mg into the skin every Wednesday.  . furosemide (LASIX) 20 MG tablet Take 1 tablet by mouth once  daily  . gabapentin (NEURONTIN) 300 MG capsule TAKE 1 CAPSULE BY MOUTH THREE TIMES DAILY  . glucose blood (ONE TOUCH ULTRA TEST) test strip USE AS DIRECTED TO MONITOR  FSBS 3 TIMES DAILY  . hydrALAZINE (APRESOLINE) 25 MG tablet TAKE 1 TABLET BY MOUTH THREE TIMES DAILY  . Insulin Pen Needle (LIVE BETTER PEN NEEDLES) 31G X 6 MM MISC To use with Tresiba pens daily  . isosorbide mononitrate (IMDUR) 30 MG 24 hr tablet Take 1 tablet by mouth once daily  . Lancets (ONETOUCH ULTRASOFT) lancets Use as instructed  . linagliptin (TRADJENTA) 5 MG TABS tablet Take 1 tablet (5 mg total) by mouth daily.  . metoprolol succinate (TOPROL-XL) 100 MG 24 hr tablet Take 1 tablet (100 mg total) by mouth at bedtime. Take with or immediately following a meal.  . PROAIR HFA 108 (90 Base) MCG/ACT inhaler Inhale 2 puffs into the lungs every 6 (six) hours as needed for wheezing or shortness of breath.  . rosuvastatin (CRESTOR) 20 MG tablet Take 1 tablet by mouth once daily  . sodium bicarbonate 650 MG tablet Take 1 tablet (650 mg total) by mouth 2 (two) times daily.  . solifenacin (VESICARE) 5 MG tablet Take 5 mg by mouth daily.  . TRESIBA FLEXTOUCH 100 UNIT/ML SOPN FlexTouch Pen INJECT 150 UNITS SUBCUTANEOUSLY ONCE DAILY  . VITAMIN A PO Take 2,400 Units by  mouth daily.   Current Facility-Administered Medications (Other)  Medication Route  . Bevacizumab (AVASTIN) SOLN 1.25 mg Intravitreal  . Bevacizumab (AVASTIN) SOLN 1.25 mg Intravitreal  . Bevacizumab (AVASTIN) SOLN 1.25 mg Intravitreal  . Bevacizumab (AVASTIN) SOLN 1.25 mg Intravitreal  . Bevacizumab (AVASTIN) SOLN 1.25 mg Intravitreal      REVIEW OF SYSTEMS: ROS    Positive for: Genitourinary, Endocrine, Cardiovascular, Eyes   Negative for: Constitutional, Gastrointestinal, Neurological, Skin, Musculoskeletal, HENT, Respiratory, Psychiatric, Allergic/Imm, Heme/Lymph   Last edited by Theodore Demark, COA on 04/30/2019 10:21 AM. (History)        ALLERGIES Allergies  Allergen Reactions  . Penicillins Other (See Comments)    UNSPECIFIED CHILDHOOD REACTION  Has patient had a PCN reaction causing immediate rash, facial/tongue/throat swelling, SOB or lightheadedness with hypotension: Unknown Has patient had a PCN reaction causing severe rash involving mucus membranes or skin necrosis: Unknown Has patient had a PCN reaction that required hospitalization: Unknown Has patient had a PCN reaction occurring within the last 10 years: Unknown If all of the above answers are "NO", then may proceed with Cephalosporin use.   . Adhesive [Tape] Rash    PAST MEDICAL HISTORY Past Medical History:  Diagnosis Date  . Anemia   . Anemia associated with chronic renal failure   . Anemia in chronic kidney disease 09/29/2018  . Arthritis   . Asthma   . Cataract    OD  . CKD stage 3 due to type 2 diabetes mellitus ([place])   . Coronary artery calcification seen on CAT scan 02/17/2018   Coronary calcification on CT  . Depression   . Diabetic retinopathy of both eyes ([place])   . Diabetic ulcer of right foot associated with type 2 diabetes mellitus ([place])   . Essential hypertension 02/17/2018   Essential hypertension  . GERD (gastroesophageal reflux disease)    pt denies  . HLD (hyperlipidemia)   . Hypertension   . Hypertensive retinopathy    OU  . Left ventricular dysfunction 04/07/2018   Left ventricle dysfunction  . Microalbuminuria due to type 2 diabetes mellitus ([place])   . Neuromuscular disorder ([place])    diabetic neuropathy  . Nonproliferative retinopathy due to secondary diabetes ([place])   . Nonsustained ventricular tachycardia ([place]) 08/28/2018   Nonsustained ventricular tachycardia  . Pneumonia    hx of   . Renal cell cancer ([place])   . Right renal mass 03/10/2017  . Type 2 diabetes, controlled, with neuropathy ([place]) 06/22/2013  . Ulcer of other part of foot 06/22/2013  . 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 BASCILIC VEIN TRANSPOSITION Left 08/08/2017   Procedure: BASILIC VEIN TRANSPOSITION FIRST STAGE LEFT ARM;  Surgeon: Serafina Mitchell, MD;  Location: [facility];  Service: Vascular;  Laterality: Left;  . BASCILIC VEIN TRANSPOSITION Left 05/14/2018   Procedure: SECOND STAGE BASILIC VEIN TRANSPOSITION LEFT ARM;  Surgeon: Serafina Mitchell, MD;  Location: [facility];  Service: Vascular;  Laterality: Left;  . CATARACT EXTRACTION Left   . EYE SURGERY    . ROBOTIC ADRENALECTOMY Left 03/10/2017   Procedure: XI ROBOTIC ADRENALECTOMY;  Surgeon: Nickie Retort, MD;  Location: [facility];  Service: Urology;  Laterality: Left;  . ROBOTIC ASSITED PARTIAL NEPHRECTOMY Right 03/10/2017   Procedure: XI ROBOTIC ASSITED RADICAL NEPHRECTOMY;  Surgeon: Nickie Retort, MD;  Location: [facility];  Service: Urology;  Laterality: Right;  . TOE AMPUTATION Bilateral  both great toe ,, left foot 2nd toe 1/2    FAMILY HISTORY Family History  Problem Relation Age of Onset  . Heart disease Mother   . Diabetes Sister     SOCIAL HISTORY Social History   Tobacco Use  . Smoking status: Never Smoker  . Smokeless tobacco: Never Used  Substance Use Topics  . Alcohol use: Yes    Alcohol/week: 0.0 standard drinks    Comment: occ  . Drug use: No         OPHTHALMIC EXAM:  Base Eye Exam    Visual Acuity (Snellen - Linear)      Right Left   Dist [place] 20/25 -2 20/25   Dist ph [place] 20/20 -1        Tonometry (Tonopen, 10:18 AM)      Right Left   Pressure 15 13       Pupils      Dark Light Shape React APD   Right 3 2 Round Brisk None   Left 3 2 Round Brisk None       Visual Fields (Counting fingers)      Left Right    Full Full       Extraocular Movement      Right Left    Full Full       Neuro/Psych    Oriented x3: Yes   Mood/Affect: Normal       Dilation    Both eyes:         Slit Lamp and Fundus Exam    Slit Lamp Exam      Right Left   Lids/Lashes  Dermatochalasis - upper lid, mild MGD Dermatochalasis - upper lid, Telangiectasia, mild MGD   Conjunctiva/Sclera White and quiet White and quiet   Cornea 1+ Punctate epithelial erosions, mild Arcus Well-healed temporal cataract wound   Anterior Chamber Deep and quiet Deep   Iris Round and dilated, No NVI Round and well dilated   Lens 2-3+ Nuclear sclerosis with early brunescence, 3+ Cortical cataract mild PCIOL in good position   Vitreous Vitreous syneresis, Posterior vitreous detachment Vitreous syneresis       Fundus Exam      Right Left   Disc Pink and Sharp Pink and Sharp   C/D Ratio 0.3 0.2   Macula good foveal reflex, scattered MA and IRH greatest nasally, +cystic changes nasal macula Blunted foveal reflex, scattered DBH, small cluster of DBH IN macula - mildly improved   Vessels Vascular attenuation, Tortuous, AV crossing changes Tortuous, Vascular attenuation, AV crossing changes   Periphery Attached, scattered peripapillary DBH and small CWS nasal to disc - improving Attached, scattered MA/DBH          IMAGING AND PROCEDURES  Imaging and Procedures for '@TODAY'$ @  OCT, Retina - OU - Both Eyes       Right Eye Quality was good. Central Foveal Thickness: 245. Progression has been stable. Findings include normal foveal contour, no SRF, intraretinal fluid (Persistent peripapillary cystic changes nasal macula).   Left Eye Quality was good. Central Foveal Thickness: 255. Progression has been stable. Findings include normal foveal contour, intraretinal fluid, no SRF, vitreomacular adhesion , intraretinal hyper-reflective material (Persistent non-central IRF, inferonasal to fovea).   Notes *Images captured and stored on drive  Diagnosis / Impression:  NFP; +IRF; no SRF OU noncentral DME OU (OS > OD) OD - Persistent peripapillary cystic changes nasal macula OS - Persistent non-central IRF, inferonasal to fovea  Clinical management:  See below  Abbreviations: NFP - Normal  foveal profile. CME - cystoid macular edema. PED - pigment epithelial detachment. IRF - intraretinal fluid. SRF - subretinal fluid. EZ - ellipsoid zone. ERM - epiretinal membrane. ORA - outer retinal atrophy. ORT - outer retinal tubulation. SRHM - subretinal hyper-reflective material        Intravitreal Injection, Pharmacologic Agent - OS - Left Eye       Time Out 04/30/2019. 11:42 AM. Confirmed correct patient, procedure, site, and patient consented.   Anesthesia Topical anesthesia was used. Anesthetic medications included Lidocaine 2%, Proparacaine 0.5%.   Procedure Preparation included 5% betadine to ocular surface, eyelid speculum. A 30 gauge needle was used.   Injection:  1.25 mg Bevacizumab (AVASTIN) SOLN   NDC: 67619-509-32, Lot: [id number] , Expiration date: 06/02/2019   Route: Intravitreal, Site: Left Eye, Waste: 0 mL  Post-op Post injection exam found visual acuity of at least counting fingers. The patient tolerated the procedure well. There were no complications. The patient received written and verbal post procedure care education.                 ASSESSMENT/PLAN:    ICD-10-CM   1. Moderate nonproliferative diabetic retinopathy of both eyes with macular edema associated with type 2 diabetes mellitus (HCC)  24/05/2019 Intravitreal Injection, Pharmacologic Agent - OS - Left Eye    Bevacizumab (AVASTIN) SOLN 1.25 mg  2. Retinal edema  H35.81 OCT, Retina - OU - Both Eyes  3. Essential hypertension  I10   4. Hypertensive retinopathy of both eyes  H35.033   5. Combined forms of age-related cataract of right eye  H25.811   6. Pseudophakia  Z96.1     1,2 Moderate non-proliferative diabetic retinopathy w/ DME OU (OS > OD)  - Initial exam shows scattered MA, BCVA 20/50 OS  - Initial FA (05/12/18) shows leaking MA, no NV OS  - Initial OCT showed diabetic macular edema OU (OS > OD)   - s/p IVA #1 OS (10.22.19), #2 (11.20.19), #3 (12.20.19), #4 (01.31.20), #5 (03.13.20),  #6 (04.22.20), #7 (06.05.20), #8 (07.17.20), #9 (08.28.20)  - today BCVA stable at 20/25 OS, slightly improved to 20/25 from 20/30 OD   - OCT persistent IRF OU  - recommend IVA #10 OS today, 10.07.20 w/ extension to 8 wks  - discussed possible need for IVA OD in the future  - pt wishes to proceed  - RBA of procedure discussed, questions answered  - informed consent obtained and signed  - see procedure note  - f/u in 8 wks, DFE, OCT, possible injxn OU  3,4. Hypertensive retinopathy OU  - discussed importance of tight BP control  - monitor  5. Combined Form Cataracts OD  - The symptoms of cataract, surgical options, and treatments and risks were discussed with patient.  - discussed diagnosis and progression  - visually significant OD  - under the expert management of Dr. 23.07.20  - clear from a retina standpoint to proceed with cataract surgery when pt and surgeon are ready  - recommend coordinating care so that pt receives anti-VEGF 1-2 wks prior to cataract surgery, if possible  - patient states she's waiting to schedule surgery based on finances  6. Pseudophakia OS  - s/p CE/IOL OS w/ Dr. [name]  - beautiful surgery, doing well  - monitor   Ophthalmic Meds Ordered this visit:  Meds ordered this encounter  Medications  . Bevacizumab (AVASTIN) SOLN 1.25 mg       Return in about 8 weeks (around  06/25/2019) for f/u NPDR OU, DFE, OCT.  There are no Patient Instructions on file for this visit.   Explained the diagnoses, plan, and follow up with the patient and they expressed understanding.  Patient expressed understanding of the importance of proper follow up care.   This document serves as a record of services personally performed by Gardiner Sleeper, MD, PhD. It was created on their behalf by Ernest Mallick, OA, an ophthalmic assistant. The creation of this record is the provider's dictation and/or activities during the visit.    Electronically signed by: Ernest Mallick, OA  10.07.2020 4:43 PM    Gardiner Sleeper, M.D., Ph.D. Diseases & Surgery of the Retina and Vitreous [facility]: M myopia (nearsighted); A astigmatism; H hyperopia (farsighted); P presbyopia; Mrx spectacle prescription;  CTL contact lenses; OD right eye; OS left eye; OU both eyes  XT exotropia; ET esotropia; PEK punctate epithelial keratitis; PEE punctate epithelial erosions; DES dry eye syndrome; MGD meibomian gland dysfunction; ATs artificial tears; PFAT's preservative free artificial tears; [place] nuclear sclerotic cataract; PSC posterior subcapsular cataract; ERM epi-retinal membrane; PVD posterior vitreous detachment; RD retinal detachment; DM diabetes mellitus; DR diabetic retinopathy; NPDR non-proliferative diabetic retinopathy; PDR proliferative diabetic retinopathy; CSME clinically significant macular edema; DME diabetic macular edema; dbh dot blot hemorrhages; CWS cotton wool spot; POAG primary open angle glaucoma; C/D cup-to-disc ratio; HVF humphrey visual field; GVF goldmann visual field; OCT optical coherence tomography; IOP intraocular pressure; BRVO Branch retinal vein occlusion; CRVO central retinal vein occlusion; CRAO central retinal artery occlusion; BRAO branch retinal artery occlusion; RT retinal tear; SB scleral buckle; PPV pars plana vitrectomy; VH Vitreous hemorrhage; PRP panretinal laser photocoagulation; IVK intravitreal kenalog; VMT vitreomacular traction; MH Macular hole;  NVD neovascularization of the disc; NVE neovascularization elsewhere; AREDS age related eye disease study; ARMD age related macular degeneration; POAG primary open angle glaucoma; EBMD epithelial/anterior basement membrane dystrophy; ACIOL anterior chamber intraocular lens; IOL intraocular lens; PCIOL posterior chamber intraocular lens; Phaco/IOL phacoemulsification with intraocular lens placement; [place] photorefractive keratectomy; LASIK laser assisted in situ  keratomileusis; HTN hypertension; DM diabetes mellitus; COPD chronic obstructive pulmonary disease

## 2019-04-30 ENCOUNTER — Ambulatory Visit (INDEPENDENT_AMBULATORY_CARE_PROVIDER_SITE_OTHER): Payer: Medicare Other | Admitting: Ophthalmology

## 2019-04-30 ENCOUNTER — Encounter (INDEPENDENT_AMBULATORY_CARE_PROVIDER_SITE_OTHER): Payer: Self-pay | Admitting: Ophthalmology

## 2019-04-30 ENCOUNTER — Other Ambulatory Visit: Payer: Self-pay

## 2019-04-30 DIAGNOSIS — Z961 Presence of intraocular lens: Secondary | ICD-10-CM

## 2019-04-30 DIAGNOSIS — E113313 Type 2 diabetes mellitus with moderate nonproliferative diabetic retinopathy with macular edema, bilateral: Secondary | ICD-10-CM

## 2019-04-30 DIAGNOSIS — I10 Essential (primary) hypertension: Secondary | ICD-10-CM

## 2019-04-30 DIAGNOSIS — H3581 Retinal edema: Secondary | ICD-10-CM

## 2019-04-30 DIAGNOSIS — H35033 Hypertensive retinopathy, bilateral: Secondary | ICD-10-CM | POA: Diagnosis not present

## 2019-04-30 DIAGNOSIS — H25811 Combined forms of age-related cataract, right eye: Secondary | ICD-10-CM | POA: Diagnosis not present

## 2019-04-30 MED ORDER — BEVACIZUMAB CHEMO INJECTION 1.25MG/0.05ML SYRINGE FOR KALEIDOSCOPE
1.2500 mg | INTRAVITREAL | Status: AC | PRN
  Administered 2019-04-30: 1.25 mg via INTRAVITREAL

## 2019-05-03 DIAGNOSIS — H1131 Conjunctival hemorrhage, right eye: Secondary | ICD-10-CM | POA: Diagnosis not present

## 2019-05-03 DIAGNOSIS — H20041 Secondary noninfectious iridocyclitis, right eye: Secondary | ICD-10-CM | POA: Diagnosis not present

## 2019-05-03 DIAGNOSIS — S098XXA Other specified injuries of head, initial encounter: Secondary | ICD-10-CM | POA: Diagnosis not present

## 2019-05-05 ENCOUNTER — Other Ambulatory Visit: Payer: Self-pay | Admitting: Family Medicine

## 2019-05-06 ENCOUNTER — Other Ambulatory Visit: Payer: Medicare Other

## 2019-05-06 ENCOUNTER — Other Ambulatory Visit: Payer: Self-pay

## 2019-05-06 DIAGNOSIS — D631 Anemia in chronic kidney disease: Secondary | ICD-10-CM

## 2019-05-06 DIAGNOSIS — E119 Type 2 diabetes mellitus without complications: Secondary | ICD-10-CM | POA: Diagnosis not present

## 2019-05-06 DIAGNOSIS — Z794 Long term (current) use of insulin: Secondary | ICD-10-CM | POA: Diagnosis not present

## 2019-05-06 DIAGNOSIS — E785 Hyperlipidemia, unspecified: Secondary | ICD-10-CM | POA: Diagnosis not present

## 2019-05-06 DIAGNOSIS — I10 Essential (primary) hypertension: Secondary | ICD-10-CM

## 2019-05-06 DIAGNOSIS — N185 Chronic kidney disease, stage 5: Secondary | ICD-10-CM

## 2019-05-07 ENCOUNTER — Ambulatory Visit (HOSPITAL_COMMUNITY)
Admission: RE | Admit: 2019-05-07 | Discharge: 2019-05-07 | Disposition: A | Discharge status: Home / Self Care | Payer: Medicare Other | Source: Ambulatory Visit | Attending: Nephrology | Admitting: Nephrology

## 2019-05-07 VITALS — BP 110/61 | HR 63 | Temp 96.5°F | Resp 18

## 2019-05-07 DIAGNOSIS — N184 Chronic kidney disease, stage 4 (severe): Secondary | ICD-10-CM | POA: Insufficient documentation

## 2019-05-07 DIAGNOSIS — D631 Anemia in chronic kidney disease: Secondary | ICD-10-CM | POA: Diagnosis not present

## 2019-05-07 LAB — CBC WITH DIFFERENTIAL/PLATELET
Absolute Monocytes: 475 cells/uL (ref 200–950)
Basophils Absolute: 43 cells/uL (ref 0–200)
Basophils Relative: 0.6 %
Eosinophils Absolute: 202 cells/uL (ref 15–500)
Eosinophils Relative: 2.8 %
HCT: 32.3 % — ABNORMAL LOW (ref 35.0–45.0)
Hemoglobin: 10.8 g/dL — ABNORMAL LOW (ref 11.7–15.5)
Lymphs Abs: 1246 cells/uL (ref 850–3900)
MCH: 30.8 pg (ref 27.0–33.0)
MCHC: 33.4 g/dL (ref 32.0–36.0)
MCV: 92 fL (ref 80.0–100.0)
MPV: 11 fL (ref 7.5–12.5)
Monocytes Relative: 6.6 %
Neutro Abs: 5234 cells/uL (ref 1500–7800)
Neutrophils Relative %: 72.7 %
Platelets: 165 10*3/uL (ref 140–400)
RBC: 3.51 10*6/uL — ABNORMAL LOW (ref 3.80–5.10)
RDW: 13 % (ref 11.0–15.0)
Total Lymphocyte: 17.3 %
WBC: 7.2 10*3/uL (ref 3.8–10.8)

## 2019-05-07 LAB — LIPID PANEL
Cholesterol: 115 mg/dL (ref <200)
HDL: 29 mg/dL — ABNORMAL LOW (ref >=50)
LDL Cholesterol (Calc): 65 mg/dL (calc)
Non-HDL Cholesterol (Calc): 86 mg/dL (calc) (ref <130)
Total CHOL/HDL Ratio: 4 (calc) (ref <5.0)
Triglycerides: 124 mg/dL (ref <150)

## 2019-05-07 LAB — COMPREHENSIVE METABOLIC PANEL
AG Ratio: 1.3 (calc) (ref 1.0–2.5)
ALT: 10 U/L (ref 6–29)
AST: 13 U/L (ref 10–35)
Albumin: 3.6 g/dL (ref 3.6–5.1)
Alkaline phosphatase (APISO): 66 U/L (ref 37–153)
BUN/Creatinine Ratio: 15 (calc) (ref 6–22)
BUN: 84 mg/dL — ABNORMAL HIGH (ref 7–25)
CO2: 23 mmol/L (ref 20–32)
Calcium: 8.4 mg/dL — ABNORMAL LOW (ref 8.6–10.4)
Chloride: 108 mmol/L (ref 98–110)
Creat: 5.56 mg/dL — ABNORMAL HIGH (ref 0.50–0.99)
Globulin: 2.7 g/dL (calc) (ref 1.9–3.7)
Glucose, Bld: 68 mg/dL (ref 65–99)
Potassium: 4.9 mmol/L (ref 3.5–5.3)
Sodium: 143 mmol/L (ref 135–146)
Total Bilirubin: 0.4 mg/dL (ref 0.2–1.2)
Total Protein: 6.3 g/dL (ref 6.1–8.1)

## 2019-05-07 LAB — HEMOGLOBIN A1C
Hgb A1c MFr Bld: 6.3 % of total Hgb — ABNORMAL HIGH (ref <5.7)
Mean Plasma Glucose: 134 (calc)
eAG (mmol/L): 7.4 (calc)

## 2019-05-07 MED ORDER — EPOETIN ALFA-EPBX 10000 UNIT/ML IJ SOLN
10000.0000 [IU] | INTRAMUSCULAR | Status: DC
  Administered 2019-05-07: 13:00:00 10000 [IU] via SUBCUTANEOUS
  Filled 2019-05-07: qty 1

## 2019-05-10 ENCOUNTER — Ambulatory Visit (INDEPENDENT_AMBULATORY_CARE_PROVIDER_SITE_OTHER): Payer: Medicare Other | Admitting: Family Medicine

## 2019-05-10 ENCOUNTER — Encounter: Payer: Self-pay | Admitting: Family Medicine

## 2019-05-10 ENCOUNTER — Other Ambulatory Visit: Payer: Self-pay

## 2019-05-10 VITALS — BP 122/60 | HR 64 | Temp 97.1°F | Resp 16 | Ht 67.0 in | Wt 238.0 lb

## 2019-05-10 DIAGNOSIS — D631 Anemia in chronic kidney disease: Secondary | ICD-10-CM

## 2019-05-10 DIAGNOSIS — E119 Type 2 diabetes mellitus without complications: Secondary | ICD-10-CM

## 2019-05-10 DIAGNOSIS — N189 Chronic kidney disease, unspecified: Secondary | ICD-10-CM | POA: Diagnosis not present

## 2019-05-10 DIAGNOSIS — E785 Hyperlipidemia, unspecified: Secondary | ICD-10-CM | POA: Diagnosis not present

## 2019-05-10 DIAGNOSIS — I10 Essential (primary) hypertension: Secondary | ICD-10-CM | POA: Diagnosis not present

## 2019-05-10 DIAGNOSIS — D509 Iron deficiency anemia, unspecified: Secondary | ICD-10-CM | POA: Diagnosis not present

## 2019-05-10 DIAGNOSIS — Z794 Long term (current) use of insulin: Secondary | ICD-10-CM

## 2019-05-10 DIAGNOSIS — N186 End stage renal disease: Secondary | ICD-10-CM | POA: Diagnosis not present

## 2019-05-10 DIAGNOSIS — N185 Chronic kidney disease, stage 5: Secondary | ICD-10-CM | POA: Diagnosis not present

## 2019-05-10 DIAGNOSIS — C649 Malignant neoplasm of unspecified kidney, except renal pelvis: Secondary | ICD-10-CM | POA: Diagnosis not present

## 2019-05-10 DIAGNOSIS — N2581 Secondary hyperparathyroidism of renal origin: Secondary | ICD-10-CM | POA: Diagnosis not present

## 2019-05-10 DIAGNOSIS — E1122 Type 2 diabetes mellitus with diabetic chronic kidney disease: Secondary | ICD-10-CM | POA: Diagnosis not present

## 2019-05-10 DIAGNOSIS — N184 Chronic kidney disease, stage 4 (severe): Secondary | ICD-10-CM | POA: Diagnosis not present

## 2019-05-10 NOTE — Progress Notes (Signed)
Subjective:    Patient ID: [name], female    DOB: [birth year], 68 y.o.   MRN: [id number]  HPI  Lab on 05/06/2019  Component Date Value Ref Range Status  . Hgb A1c MFr Bld 05/06/2019 6.3* <5.7 % of total Hgb Final   Comment: For someone without known diabetes, a hemoglobin  A1c value between 5.7% and 6.4% is consistent with prediabetes and should be confirmed with a  follow-up test. . For someone with known diabetes, a value <7% indicates that their diabetes is well controlled. A1c targets should be individualized based on duration of diabetes, age, comorbid conditions, and other considerations. . This assay result is consistent with an increased risk of diabetes. . Currently, no consensus exists regarding use of hemoglobin A1c for diagnosis of diabetes for children. .   . Mean Plasma Glucose 05/06/2019 134  (calc) Final  . eAG (mmol/L) 05/06/2019 7.4  (calc) Final  . WBC 05/06/2019 7.2  3.8 - 10.8 Thousand/uL Final  . RBC 05/06/2019 3.51* 3.80 - 5.10 Million/uL Final  . Hemoglobin 05/06/2019 10.8* 11.7 - 15.5 g/dL Final  . HCT 05/06/2019 32.3* 35.0 - 45.0 % Final  . MCV 05/06/2019 92.0  80.0 - 100.0 fL Final  . MCH 05/06/2019 30.8  27.0 - 33.0 pg Final  . MCHC 05/06/2019 33.4  32.0 - 36.0 g/dL Final  . RDW 05/06/2019 13.0  11.0 - 15.0 % Final  . Platelets 05/06/2019 165  140 - 400 Thousand/uL Final  . MPV 05/06/2019 11.0  7.5 - 12.5 fL Final  . Neutro Abs 05/06/2019 5,234  1,500 - 7,800 cells/uL Final  . Lymphs Abs 05/06/2019 1,246  850 - 3,900 cells/uL Final  . Absolute Monocytes 05/06/2019 475  200 - 950 cells/uL Final  . Eosinophils Absolute 05/06/2019 202  15 - 500 cells/uL Final  . Basophils Absolute 05/06/2019 43  0 - 200 cells/uL Final  . Neutrophils Relative % 05/06/2019 72.7  % Final  . Total Lymphocyte 05/06/2019 17.3  % Final  . Monocytes Relative 05/06/2019 6.6  % Final  . Eosinophils Relative 05/06/2019 2.8  % Final  . Basophils Relative 05/06/2019  0.6  % Final  . Cholesterol 05/06/2019 115  <200 mg/dL Final  . HDL 05/06/2019 29* > OR = 50 mg/dL Final  . Triglycerides 05/06/2019 124  <150 mg/dL Final  . LDL Cholesterol (Calc) 05/06/2019 65  mg/dL (calc) Final   Comment: Reference range: <100 . Desirable range <100 mg/dL for primary prevention;   <70 mg/dL for patients with CHD or diabetic patients  with > or = 2 CHD risk factors. Marland Kitchen LDL-C is now calculated using the Martin-Hopkins  calculation, which is a validated novel method providing  better accuracy than the Friedewald equation in the  estimation of LDL-C.  Cresenciano Genre et al. Annamaria Helling. 2992;426(83): 2061-2068  (http://education.[facility].com/faq/FAQ164)   . Total CHOL/HDL Ratio 05/06/2019 4.0  <5.0 (calc) Final  . Non-HDL Cholesterol (Calc) 05/06/2019 86  <130 mg/dL (calc) Final   Comment: For patients with diabetes plus 1 major ASCVD risk  factor, treating to a non-HDL-C goal of <100 mg/dL  (LDL-C of <70 mg/dL) is considered a therapeutic  option.   . Glucose, Bld 05/06/2019 68  65 - 99 mg/dL Final   Comment: .            Fasting reference interval .   . BUN 05/06/2019 84* 7 - 25 mg/dL Final  . Creat 05/06/2019 5.56* 0.50 - 0.99 mg/dL Final  Comment: For patients >29 years of age, the reference limit for Creatinine is approximately 13% higher for people identified as African-American. .   Havery Moros Ratio 05/06/2019 15  6 - 22 (calc) Final  . Sodium 05/06/2019 143  135 - 146 mmol/L Final  . Potassium 05/06/2019 4.9  3.5 - 5.3 mmol/L Final  . Chloride 05/06/2019 108  98 - 110 mmol/L Final  . CO2 05/06/2019 23  20 - 32 mmol/L Final  . Calcium 05/06/2019 8.4* 8.6 - 10.4 mg/dL Final  . Total Protein 05/06/2019 6.3  6.1 - 8.1 g/dL Final  . Albumin 05/06/2019 3.6  3.6 - 5.1 g/dL Final  . Globulin 05/06/2019 2.7  1.9 - 3.7 g/dL (calc) Final  . AG Ratio 05/06/2019 1.3  1.0 - 2.5 (calc) Final  . Total Bilirubin 05/06/2019 0.4  0.2 - 1.2 mg/dL Final  .  Alkaline phosphatase (APISO) 05/06/2019 66  37 - 153 U/L Final  . AST 05/06/2019 13  10 - 35 U/L Final  . ALT 05/06/2019 10  6 - 29 U/L Final  Hospital Outpatient Visit on 04/23/2019  Component Date Value Ref Range Status  . Iron 04/23/2019 54  28 - 170 ug/dL Final  . TIBC 04/23/2019 214* 250 - 450 ug/dL Final  . Saturation Ratios 04/23/2019 25  10.4 - 31.8 % Final  . UIBC 04/23/2019 160  ug/dL Final   Performed at [facility],  [address], [place], [place] [address]  . Ferritin 04/23/2019 133  11 - 307 ng/mL Final   Performed at [facility], [place] [address] [place], [place] [address]  . 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  As result, her chronic kidney disease has steadily worsened.  Her creatinine has steadily risen from just greater than 3 last year to 5.5 today.  She denies any symptoms of uremia.  She denies any confusion.  She denies any nausea or vomiting.  She does report poor appetite.  There is no evidence of fluid overload on today's exam.  There is no pitting edema in her extremities.  She denies any shortness of breath or orthopnea.  She has developed anemia of chronic kidney disease with a hemoglobin of 10.6.  However her iron and ferritin levels are within normal limits.  Recently she has been having occasional episodes of hypoglycemia.  Her hemoglobin A1c has fallen to 6.3.  She is taking [place] as prescribed however she frequently misses Trulicity.  She states that she has not taken any Trulicity in almost 4 weeks.  Still last week she had an episode of hypoglycemia that caused her to fall.  Therefore I believe she is overmedicated.  She is also on Tradjenta.  She denies any chest pain shortness of breath or dyspnea on exertion.  Her blood pressure today is well controlled at 122/60.  Her LDL cholesterol is excellent.  Recent lab work shows no  electrolyte disturbances, acidosis, that would warrant urgent dialysis Past Medical History:  Diagnosis Date  . Anemia   . Anemia associated with chronic renal failure   . Anemia in chronic kidney disease 09/29/2018  . Arthritis   . Asthma   . Cataract    OD  . CKD stage 3 due to type 2 diabetes mellitus ([place])   . Coronary artery calcification seen on CAT scan 02/17/2018   Coronary calcification  on CT  . Depression   . Diabetic retinopathy of both eyes ([place])   . Diabetic ulcer of right foot associated with type 2 diabetes mellitus ([place])   . Essential hypertension 02/17/2018   Essential hypertension  . GERD (gastroesophageal reflux disease)    pt denies  . HLD (hyperlipidemia)   . Hypertension   . Hypertensive retinopathy    OU  . Left ventricular dysfunction 04/07/2018   Left ventricle dysfunction  . Microalbuminuria due to type 2 diabetes mellitus ([place])   . Neuromuscular disorder ([place])    diabetic neuropathy  . Nonproliferative retinopathy due to secondary diabetes ([place])   . Nonsustained ventricular tachycardia ([place]) 08/28/2018   Nonsustained ventricular tachycardia  . Pneumonia    hx of   . Renal cell cancer ([place])   . Right renal mass 03/10/2017  . Type 2 diabetes, controlled, with neuropathy ([place]) 06/22/2013  . Ulcer of other part of foot 06/22/2013  . 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 BASCILIC VEIN TRANSPOSITION Left 08/08/2017   Procedure: BASILIC VEIN TRANSPOSITION FIRST STAGE LEFT ARM;  Surgeon: Serafina Mitchell, MD;  Location: [facility];  Service: Vascular;  Laterality: Left;  . BASCILIC VEIN TRANSPOSITION Left 05/14/2018   Procedure: SECOND STAGE BASILIC VEIN TRANSPOSITION LEFT ARM;  Surgeon: Serafina Mitchell, MD;  Location: [place];  Service: Vascular;  Laterality: Left;  . CATARACT EXTRACTION Left   . EYE SURGERY    . ROBOTIC ADRENALECTOMY Left 03/10/2017   Procedure: XI ROBOTIC  ADRENALECTOMY;  Surgeon: Nickie Retort, MD;  Location: [facility];  Service: Urology;  Laterality: Left;  . ROBOTIC ASSITED PARTIAL NEPHRECTOMY Right 03/10/2017   Procedure: XI ROBOTIC ASSITED RADICAL NEPHRECTOMY;  Surgeon: Nickie Retort, MD;  Location: [facility];  Service: Urology;  Laterality: Right;  . TOE AMPUTATION Bilateral    both great toe ,, left foot 2nd toe 1/2   Current Outpatient Medications on File Prior to Visit  Medication Sig Dispense Refill  . Blood Glucose Monitoring Suppl (ONE TOUCH ULTRA 2) w/Device KIT Use to check BS BID-QID Dx:E11.9 1 each 1  . BREO ELLIPTA 100-25 MCG/INH AEPB INHALE 1 PUFF BY MOUTH ONCE DAILY (Patient taking differently: Inhale 1 puff into the lungs daily. ) 60 each 11  . calcitRIOL (ROCALTROL) 0.5 MCG capsule Take 0.5 mcg by mouth daily.    Marland Kitchen CINNAMON PO Take 350 mg by mouth daily.     . Dulaglutide (TRULICITY) 7.00 FV/4.9SW SOPN Inject 0.75 mg into the skin every Wednesday. 12 mL 0  . furosemide (LASIX) 20 MG tablet Take 1 tablet by mouth once daily 90 tablet 2  . gabapentin (NEURONTIN) 300 MG capsule TAKE 1 CAPSULE BY MOUTH THREE TIMES DAILY 90 capsule 0  . hydrALAZINE (APRESOLINE) 25 MG tablet TAKE 1 TABLET BY MOUTH THREE TIMES DAILY 90 tablet 8  . Insulin Pen Needle (LIVE BETTER PEN NEEDLES) 31G X 6 MM MISC To use with Tresiba pens daily 100 each 3  . isosorbide mononitrate (IMDUR) 30 MG 24 hr tablet Take 1 tablet by mouth once daily 90 tablet 3  . Lancets (ONETOUCH ULTRASOFT) lancets Use as instructed 300 each 3  . linagliptin (TRADJENTA) 5 MG TABS tablet Take 1 tablet (5 mg total) by mouth daily. 90 tablet 1  . metoprolol succinate (TOPROL-XL) 100 MG 24 hr tablet Take 1 tablet (100 mg total) by mouth at bedtime. Take with or immediately  following a meal. 30 tablet 3  . ONETOUCH ULTRA test strip USE AS DIRECTED TO MONITOR  FSBS 3 TIMES DAILY 300 strip 0  . PROAIR HFA 108 (90 Base) MCG/ACT inhaler Inhale 2 puffs into the lungs every 6 (six)  hours as needed for wheezing or shortness of breath. 9 g 3  . rosuvastatin (CRESTOR) 20 MG tablet Take 1 tablet by mouth once daily 90 tablet 0  . sodium bicarbonate 650 MG tablet Take 1 tablet (650 mg total) by mouth 2 (two) times daily. 60 tablet 12  . solifenacin (VESICARE) 5 MG tablet Take 5 mg by mouth daily.    . TRESIBA FLEXTOUCH 100 UNIT/ML SOPN FlexTouch Pen INJECT 150 UNITS SUBCUTANEOUSLY ONCE DAILY 45 mL 0  . VITAMIN A PO Take 2,400 Units by mouth daily.     Current Facility-Administered Medications on File Prior to Visit  Medication Dose Route Frequency Provider Last Rate Last Dose  . Bevacizumab (AVASTIN) SOLN 1.25 mg  1.25 mg Intravitreal  [name], MD   1.25 mg at 05/12/18 1319  . Bevacizumab (AVASTIN) SOLN 1.25 mg  1.25 mg Intravitreal  [name], MD   1.25 mg at 06/13/18 0055  . Bevacizumab (AVASTIN) SOLN 1.25 mg  1.25 mg Intravitreal  [name], MD   1.25 mg at 07/10/18 1308  . Bevacizumab (AVASTIN) SOLN 1.25 mg  1.25 mg Intravitreal  [name], MD   1.25 mg at 08/21/18 1312  . Bevacizumab (AVASTIN) SOLN 1.25 mg  1.25 mg Intravitreal  [name], MD   1.25 mg at 10/02/18 1510   Allergies  Allergen Reactions  . Penicillins Other (See Comments)    UNSPECIFIED CHILDHOOD REACTION  Has patient had a PCN reaction causing immediate rash, facial/tongue/throat swelling, SOB or lightheadedness with hypotension: Unknown Has patient had a PCN reaction causing severe rash involving mucus membranes or skin necrosis: Unknown Has patient had a PCN reaction that required hospitalization: Unknown Has patient had a PCN reaction occurring within the last 10 years: Unknown If all of the above answers are "NO", then may proceed with Cephalosporin use.   . Adhesive [Tape] Rash   Social History   Socioeconomic History  . Marital status: Single    Spouse name: Not on file  . Number of children: Not on file  . Years of education: Not on file  . Highest education level:  Not on file  Occupational History  . Not on file  Social Needs  . Financial resource strain: Not on file  . Food insecurity    Worry: Not on file    Inability: Not on file  . Transportation needs    Medical: Not on file    Non-medical: Not on file  Tobacco Use  . Smoking status: Never Smoker  . Smokeless tobacco: Never Used  Substance and Sexual Activity  . Alcohol use: Yes    Alcohol/week: 0.0 standard drinks    Comment: occ  . Drug use: No  . Sexual activity: Not Currently    Partners: Male  Lifestyle  . Physical activity    Days per week: Not on file    Minutes per session: Not on file  . Stress: Not on file  Relationships  . Social Herbalist on phone: Not on file    Gets together: Not on file    Attends religious service: Not on file    Active member of club or organization: Not on file    Attends meetings of  clubs or organizations: Not on file    Relationship status: Not on file  . Intimate partner violence    Fear of current or ex partner: Not on file    Emotionally abused: Not on file    Physically abused: Not on file    Forced sexual activity: Not on file  Other Topics Concern  . Not on file  Social History Narrative  . Not on file     Review of Systems  All other systems reviewed and are negative.      Objective:   Physical Exam  Constitutional: She appears well-developed and well-nourished. No distress.  Cardiovascular: Normal rate, regular rhythm and normal heart sounds.  Pulmonary/Chest: Effort normal and breath sounds normal. No respiratory distress. She has no wheezes. She has no rales.  Abdominal: Soft. Bowel sounds are normal. She exhibits no distension. There is no abdominal tenderness. There is no rebound and no guarding.  Musculoskeletal:        General: Edema present.  Skin: She is not diaphoretic.  Vitals reviewed.         Assessment & Plan:    Benign essential HTN  Diabetes mellitus, type II, insulin dependent  (HCC)  Anemia in stage 5 chronic kidney disease, not on chronic dialysis (HCC)  CKD (chronic kidney disease) stage 5, GFR less than 15 ml/min (HCC)  Hyperlipidemia, unspecified hyperlipidemia type  At the present time there is no urgent indication for dialysis.  The patient is not uremic, she is not fluid overloaded, she is not acidotic, and there is no hyperkalemia.  Therefore I recommended that she follow-up with her nephrologist.  She states that she has an appointment to see him next week.  She may be nearing time to institute dialysis.  She already has an AV fistula placed in her left arm.  Her blood pressure today is well controlled.  Her cholesterol is outstanding.  I believe she is overmedicated.  Therefore of asked her to discontinue Trulicity which she was noncompliant with anyway.  Of also asked her to stop Tradjenta.  If she continues to have episodes of hypoglycemia, we will need to decrease her dosing of Tresiba.  I anticipate however that if she does start dialysis, we will see fluctuations in her blood sugar as the insulin is dialyzed from her system and will likely need to change her insulin dose as well

## 2019-05-13 DIAGNOSIS — Z85528 Personal history of other malignant neoplasm of kidney: Secondary | ICD-10-CM | POA: Diagnosis not present

## 2019-05-13 DIAGNOSIS — R911 Solitary pulmonary nodule: Secondary | ICD-10-CM | POA: Diagnosis not present

## 2019-05-18 ENCOUNTER — Other Ambulatory Visit: Payer: Self-pay | Admitting: Family Medicine

## 2019-05-20 ENCOUNTER — Other Ambulatory Visit: Payer: Self-pay | Admitting: Family Medicine

## 2019-05-20 DIAGNOSIS — J4540 Moderate persistent asthma, uncomplicated: Secondary | ICD-10-CM

## 2019-05-21 ENCOUNTER — Other Ambulatory Visit: Payer: Self-pay

## 2019-05-21 ENCOUNTER — Encounter (HOSPITAL_COMMUNITY)
Admission: RE | Admit: 2019-05-21 | Discharge: 2019-05-21 | Disposition: A | Discharge status: Home / Self Care | Payer: Medicare Other | Source: Ambulatory Visit | Attending: Nephrology | Admitting: Nephrology

## 2019-05-21 VITALS — BP 102/56 | HR 63 | Temp 96.3°F | Resp 18

## 2019-05-21 DIAGNOSIS — Z85528 Personal history of other malignant neoplasm of kidney: Secondary | ICD-10-CM | POA: Diagnosis not present

## 2019-05-21 DIAGNOSIS — D631 Anemia in chronic kidney disease: Secondary | ICD-10-CM | POA: Diagnosis not present

## 2019-05-21 DIAGNOSIS — N184 Chronic kidney disease, stage 4 (severe): Secondary | ICD-10-CM | POA: Diagnosis not present

## 2019-05-21 LAB — IRON AND TIBC
Iron: 33 ug/dL (ref 28–170)
Saturation Ratios: 16 % (ref 10.4–31.8)
TIBC: 204 ug/dL — ABNORMAL LOW (ref 250–450)
UIBC: 171 ug/dL

## 2019-05-21 LAB — FERRITIN: Ferritin: 161 ng/mL (ref 11–307)

## 2019-05-21 LAB — POCT HEMOGLOBIN-HEMACUE: Hemoglobin: 9.4 g/dL — ABNORMAL LOW (ref 12.0–15.0)

## 2019-05-21 MED ORDER — EPOETIN ALFA-EPBX 10000 UNIT/ML IJ SOLN
10000.0000 [IU] | INTRAMUSCULAR | Status: DC
  Administered 2019-05-21: 10000 [IU] via SUBCUTANEOUS
  Filled 2019-05-21: qty 1

## 2019-05-26 DIAGNOSIS — D689 Coagulation defect, unspecified: Secondary | ICD-10-CM | POA: Insufficient documentation

## 2019-05-26 DIAGNOSIS — R197 Diarrhea, unspecified: Secondary | ICD-10-CM | POA: Insufficient documentation

## 2019-05-26 DIAGNOSIS — R0602 Shortness of breath: Secondary | ICD-10-CM | POA: Insufficient documentation

## 2019-05-26 DIAGNOSIS — L299 Pruritus, unspecified: Secondary | ICD-10-CM | POA: Insufficient documentation

## 2019-05-26 DIAGNOSIS — D509 Iron deficiency anemia, unspecified: Secondary | ICD-10-CM | POA: Insufficient documentation

## 2019-05-26 DIAGNOSIS — I739 Peripheral vascular disease, unspecified: Secondary | ICD-10-CM | POA: Insufficient documentation

## 2019-05-26 DIAGNOSIS — R52 Pain, unspecified: Secondary | ICD-10-CM | POA: Insufficient documentation

## 2019-05-26 DIAGNOSIS — N2581 Secondary hyperparathyroidism of renal origin: Secondary | ICD-10-CM | POA: Insufficient documentation

## 2019-05-26 DIAGNOSIS — Z992 Dependence on renal dialysis: Secondary | ICD-10-CM | POA: Insufficient documentation

## 2019-05-26 DIAGNOSIS — I129 Hypertensive chronic kidney disease with stage 1 through stage 4 chronic kidney disease, or unspecified chronic kidney disease: Secondary | ICD-10-CM | POA: Insufficient documentation

## 2019-05-26 DIAGNOSIS — N289 Disorder of kidney and ureter, unspecified: Secondary | ICD-10-CM | POA: Insufficient documentation

## 2019-05-26 DIAGNOSIS — N186 End stage renal disease: Secondary | ICD-10-CM | POA: Insufficient documentation

## 2019-05-26 DIAGNOSIS — I509 Heart failure, unspecified: Secondary | ICD-10-CM | POA: Insufficient documentation

## 2019-05-26 DIAGNOSIS — C649 Malignant neoplasm of unspecified kidney, except renal pelvis: Secondary | ICD-10-CM | POA: Insufficient documentation

## 2019-05-26 DIAGNOSIS — Z6841 Body Mass Index (BMI) 40.0 and over, adult: Secondary | ICD-10-CM | POA: Insufficient documentation

## 2019-05-26 DIAGNOSIS — G629 Polyneuropathy, unspecified: Secondary | ICD-10-CM | POA: Insufficient documentation

## 2019-05-26 DIAGNOSIS — J45909 Unspecified asthma, uncomplicated: Secondary | ICD-10-CM | POA: Insufficient documentation

## 2019-05-27 DIAGNOSIS — N186 End stage renal disease: Secondary | ICD-10-CM | POA: Diagnosis not present

## 2019-05-27 DIAGNOSIS — D509 Iron deficiency anemia, unspecified: Secondary | ICD-10-CM | POA: Diagnosis not present

## 2019-05-27 DIAGNOSIS — E1129 Type 2 diabetes mellitus with other diabetic kidney complication: Secondary | ICD-10-CM | POA: Diagnosis not present

## 2019-05-27 DIAGNOSIS — Z992 Dependence on renal dialysis: Secondary | ICD-10-CM | POA: Diagnosis not present

## 2019-05-27 DIAGNOSIS — Z23 Encounter for immunization: Secondary | ICD-10-CM | POA: Diagnosis not present

## 2019-05-27 DIAGNOSIS — N2581 Secondary hyperparathyroidism of renal origin: Secondary | ICD-10-CM | POA: Diagnosis not present

## 2019-05-27 DIAGNOSIS — D689 Coagulation defect, unspecified: Secondary | ICD-10-CM | POA: Diagnosis not present

## 2019-05-27 DIAGNOSIS — D631 Anemia in chronic kidney disease: Secondary | ICD-10-CM | POA: Diagnosis not present

## 2019-05-29 DIAGNOSIS — E1129 Type 2 diabetes mellitus with other diabetic kidney complication: Secondary | ICD-10-CM | POA: Diagnosis not present

## 2019-05-29 DIAGNOSIS — D631 Anemia in chronic kidney disease: Secondary | ICD-10-CM | POA: Diagnosis not present

## 2019-05-29 DIAGNOSIS — D509 Iron deficiency anemia, unspecified: Secondary | ICD-10-CM | POA: Diagnosis not present

## 2019-05-29 DIAGNOSIS — N2581 Secondary hyperparathyroidism of renal origin: Secondary | ICD-10-CM | POA: Diagnosis not present

## 2019-05-29 DIAGNOSIS — Z992 Dependence on renal dialysis: Secondary | ICD-10-CM | POA: Diagnosis not present

## 2019-05-29 DIAGNOSIS — N186 End stage renal disease: Secondary | ICD-10-CM | POA: Diagnosis not present

## 2019-05-29 DIAGNOSIS — Z23 Encounter for immunization: Secondary | ICD-10-CM | POA: Diagnosis not present

## 2019-05-29 DIAGNOSIS — D689 Coagulation defect, unspecified: Secondary | ICD-10-CM | POA: Diagnosis not present

## 2019-06-01 DIAGNOSIS — D509 Iron deficiency anemia, unspecified: Secondary | ICD-10-CM | POA: Diagnosis not present

## 2019-06-01 DIAGNOSIS — N186 End stage renal disease: Secondary | ICD-10-CM | POA: Diagnosis not present

## 2019-06-01 DIAGNOSIS — N2581 Secondary hyperparathyroidism of renal origin: Secondary | ICD-10-CM | POA: Diagnosis not present

## 2019-06-01 DIAGNOSIS — E1129 Type 2 diabetes mellitus with other diabetic kidney complication: Secondary | ICD-10-CM | POA: Diagnosis not present

## 2019-06-01 DIAGNOSIS — D689 Coagulation defect, unspecified: Secondary | ICD-10-CM | POA: Diagnosis not present

## 2019-06-01 DIAGNOSIS — Z992 Dependence on renal dialysis: Secondary | ICD-10-CM | POA: Diagnosis not present

## 2019-06-01 DIAGNOSIS — D631 Anemia in chronic kidney disease: Secondary | ICD-10-CM | POA: Diagnosis not present

## 2019-06-01 DIAGNOSIS — Z23 Encounter for immunization: Secondary | ICD-10-CM | POA: Diagnosis not present

## 2019-06-03 DIAGNOSIS — Z23 Encounter for immunization: Secondary | ICD-10-CM | POA: Diagnosis not present

## 2019-06-03 DIAGNOSIS — Z992 Dependence on renal dialysis: Secondary | ICD-10-CM | POA: Diagnosis not present

## 2019-06-03 DIAGNOSIS — D631 Anemia in chronic kidney disease: Secondary | ICD-10-CM | POA: Diagnosis not present

## 2019-06-03 DIAGNOSIS — D509 Iron deficiency anemia, unspecified: Secondary | ICD-10-CM | POA: Diagnosis not present

## 2019-06-03 DIAGNOSIS — N2581 Secondary hyperparathyroidism of renal origin: Secondary | ICD-10-CM | POA: Diagnosis not present

## 2019-06-03 DIAGNOSIS — N186 End stage renal disease: Secondary | ICD-10-CM | POA: Diagnosis not present

## 2019-06-03 DIAGNOSIS — E1129 Type 2 diabetes mellitus with other diabetic kidney complication: Secondary | ICD-10-CM | POA: Diagnosis not present

## 2019-06-03 DIAGNOSIS — D689 Coagulation defect, unspecified: Secondary | ICD-10-CM | POA: Diagnosis not present

## 2019-06-04 ENCOUNTER — Encounter (HOSPITAL_COMMUNITY): Payer: Medicare Other

## 2019-06-05 DIAGNOSIS — D631 Anemia in chronic kidney disease: Secondary | ICD-10-CM | POA: Diagnosis not present

## 2019-06-05 DIAGNOSIS — N2581 Secondary hyperparathyroidism of renal origin: Secondary | ICD-10-CM | POA: Diagnosis not present

## 2019-06-05 DIAGNOSIS — D689 Coagulation defect, unspecified: Secondary | ICD-10-CM | POA: Diagnosis not present

## 2019-06-05 DIAGNOSIS — Z23 Encounter for immunization: Secondary | ICD-10-CM | POA: Diagnosis not present

## 2019-06-05 DIAGNOSIS — D509 Iron deficiency anemia, unspecified: Secondary | ICD-10-CM | POA: Diagnosis not present

## 2019-06-05 DIAGNOSIS — Z992 Dependence on renal dialysis: Secondary | ICD-10-CM | POA: Diagnosis not present

## 2019-06-05 DIAGNOSIS — N186 End stage renal disease: Secondary | ICD-10-CM | POA: Diagnosis not present

## 2019-06-05 DIAGNOSIS — E1129 Type 2 diabetes mellitus with other diabetic kidney complication: Secondary | ICD-10-CM | POA: Diagnosis not present

## 2019-06-08 ENCOUNTER — Other Ambulatory Visit: Payer: Self-pay | Admitting: Family Medicine

## 2019-06-08 DIAGNOSIS — N2581 Secondary hyperparathyroidism of renal origin: Secondary | ICD-10-CM | POA: Diagnosis not present

## 2019-06-08 DIAGNOSIS — Z23 Encounter for immunization: Secondary | ICD-10-CM | POA: Diagnosis not present

## 2019-06-08 DIAGNOSIS — Z2839 Other underimmunization status: Secondary | ICD-10-CM | POA: Insufficient documentation

## 2019-06-08 DIAGNOSIS — D631 Anemia in chronic kidney disease: Secondary | ICD-10-CM | POA: Diagnosis not present

## 2019-06-08 DIAGNOSIS — D509 Iron deficiency anemia, unspecified: Secondary | ICD-10-CM | POA: Diagnosis not present

## 2019-06-08 DIAGNOSIS — Z283 Underimmunization status: Secondary | ICD-10-CM | POA: Insufficient documentation

## 2019-06-08 DIAGNOSIS — D689 Coagulation defect, unspecified: Secondary | ICD-10-CM | POA: Diagnosis not present

## 2019-06-08 DIAGNOSIS — Z992 Dependence on renal dialysis: Secondary | ICD-10-CM | POA: Diagnosis not present

## 2019-06-08 DIAGNOSIS — N186 End stage renal disease: Secondary | ICD-10-CM | POA: Diagnosis not present

## 2019-06-08 DIAGNOSIS — E1129 Type 2 diabetes mellitus with other diabetic kidney complication: Secondary | ICD-10-CM | POA: Diagnosis not present

## 2019-06-10 DIAGNOSIS — D631 Anemia in chronic kidney disease: Secondary | ICD-10-CM | POA: Diagnosis not present

## 2019-06-10 DIAGNOSIS — D509 Iron deficiency anemia, unspecified: Secondary | ICD-10-CM | POA: Diagnosis not present

## 2019-06-10 DIAGNOSIS — Z23 Encounter for immunization: Secondary | ICD-10-CM | POA: Diagnosis not present

## 2019-06-10 DIAGNOSIS — D689 Coagulation defect, unspecified: Secondary | ICD-10-CM | POA: Diagnosis not present

## 2019-06-10 DIAGNOSIS — E1129 Type 2 diabetes mellitus with other diabetic kidney complication: Secondary | ICD-10-CM | POA: Diagnosis not present

## 2019-06-10 DIAGNOSIS — Z992 Dependence on renal dialysis: Secondary | ICD-10-CM | POA: Diagnosis not present

## 2019-06-10 DIAGNOSIS — N186 End stage renal disease: Secondary | ICD-10-CM | POA: Diagnosis not present

## 2019-06-10 DIAGNOSIS — N2581 Secondary hyperparathyroidism of renal origin: Secondary | ICD-10-CM | POA: Diagnosis not present

## 2019-06-12 DIAGNOSIS — N2581 Secondary hyperparathyroidism of renal origin: Secondary | ICD-10-CM | POA: Diagnosis not present

## 2019-06-12 DIAGNOSIS — N186 End stage renal disease: Secondary | ICD-10-CM | POA: Diagnosis not present

## 2019-06-12 DIAGNOSIS — E1129 Type 2 diabetes mellitus with other diabetic kidney complication: Secondary | ICD-10-CM | POA: Diagnosis not present

## 2019-06-12 DIAGNOSIS — Z992 Dependence on renal dialysis: Secondary | ICD-10-CM | POA: Diagnosis not present

## 2019-06-12 DIAGNOSIS — D689 Coagulation defect, unspecified: Secondary | ICD-10-CM | POA: Diagnosis not present

## 2019-06-12 DIAGNOSIS — D631 Anemia in chronic kidney disease: Secondary | ICD-10-CM | POA: Diagnosis not present

## 2019-06-12 DIAGNOSIS — Z23 Encounter for immunization: Secondary | ICD-10-CM | POA: Diagnosis not present

## 2019-06-12 DIAGNOSIS — D509 Iron deficiency anemia, unspecified: Secondary | ICD-10-CM | POA: Diagnosis not present

## 2019-06-14 DIAGNOSIS — D689 Coagulation defect, unspecified: Secondary | ICD-10-CM | POA: Diagnosis not present

## 2019-06-14 DIAGNOSIS — D631 Anemia in chronic kidney disease: Secondary | ICD-10-CM | POA: Diagnosis not present

## 2019-06-14 DIAGNOSIS — N2581 Secondary hyperparathyroidism of renal origin: Secondary | ICD-10-CM | POA: Diagnosis not present

## 2019-06-14 DIAGNOSIS — Z992 Dependence on renal dialysis: Secondary | ICD-10-CM | POA: Diagnosis not present

## 2019-06-14 DIAGNOSIS — Z23 Encounter for immunization: Secondary | ICD-10-CM | POA: Diagnosis not present

## 2019-06-14 DIAGNOSIS — E1129 Type 2 diabetes mellitus with other diabetic kidney complication: Secondary | ICD-10-CM | POA: Diagnosis not present

## 2019-06-14 DIAGNOSIS — N186 End stage renal disease: Secondary | ICD-10-CM | POA: Diagnosis not present

## 2019-06-14 DIAGNOSIS — D509 Iron deficiency anemia, unspecified: Secondary | ICD-10-CM | POA: Diagnosis not present

## 2019-06-15 ENCOUNTER — Telehealth: Payer: Self-pay | Admitting: Cardiology

## 2019-06-15 ENCOUNTER — Encounter: Payer: Self-pay | Admitting: Physician Assistant

## 2019-06-15 ENCOUNTER — Telehealth (INDEPENDENT_AMBULATORY_CARE_PROVIDER_SITE_OTHER): Payer: Medicare Other | Admitting: Physician Assistant

## 2019-06-15 VITALS — Ht 67.0 in | Wt 229.1 lb

## 2019-06-15 DIAGNOSIS — I10 Essential (primary) hypertension: Secondary | ICD-10-CM

## 2019-06-15 DIAGNOSIS — E119 Type 2 diabetes mellitus without complications: Secondary | ICD-10-CM

## 2019-06-15 DIAGNOSIS — N186 End stage renal disease: Secondary | ICD-10-CM

## 2019-06-15 DIAGNOSIS — I5022 Chronic systolic (congestive) heart failure: Secondary | ICD-10-CM

## 2019-06-15 DIAGNOSIS — R55 Syncope and collapse: Secondary | ICD-10-CM

## 2019-06-15 DIAGNOSIS — E782 Mixed hyperlipidemia: Secondary | ICD-10-CM

## 2019-06-15 NOTE — Telephone Encounter (Signed)
Spoke with pt who states she is on her third week of dialysis. She was advised not to take furosemide, Imdur, metoprolol. She states she was taken off of her hydralazine in September 2020. Pt states she has not taken any of her BP meds since she was advised not to take these. She states that her dialysis days are Tues-Thurs-Sat at 12:15 pm. Pt states on dialysis days she is is symptomatic with BP down to 87/60, 'swimmy' feeling to head, weak, lightheaded especially on standing. She states she has needed assistance to the car after dialysis. Pt states she has had two syncopal episodes this month; most recent was last Thursday during dialysis. She also states she woke up on the floor one night about a month ago and was there until her sister came to check on her. She states that her concern is that she has now been having the same symptoms on days when she does not have scheduled dialysis. Pt states fluid restrictions are no more than 40 oz of fluid a day. Pt states she checks her blood glucose regularly and has not really had issues with this. She also states there is a difference in how she feels with low CBG and low BP. She states she does not check her BP regularly. Advised to start checking BP 1-2 times daily so that there is a log of trends in BP. She states there is an MD at her dialysis center who is aware of her symptoms but had no recommendations for her so she wanted to f/u with cardiology. Pt unable to present for OV today. Pt set for virtual appt with Almyra Deforest, PA-C at 3:30 pm. Advised pt to reach out to dialysis center and her PCP to advise. Also advised to call 911 for presyncopal events.

## 2019-06-15 NOTE — Telephone Encounter (Signed)
Pt c/o BP issue: STAT if pt c/o blurred vision, one-sided weakness or slurred speech  1. What are your last 5 BP readings? 87/60, unsure of the rest, states the bottoms numbers have gotten as low as in the 50's.   2. Are you having any other symptoms (ex. Dizziness, headache, blurred vision, passed out)? Dizziness, passed out  3. What is your BP issue? She was taken off all of her bp medications due to dialysis. Head feels like it is spinning and her vision gets blurry and she has passed out twice.   Pt c/o Syncope: STAT if syncope occurred within 30 minutes and pt complains of lightheadedness High Priority if episode of passing out, completely, today or in last 24 hours   1. Did you pass out today? No   2. When is the last time you passed out? Last Thursday at her dialysis treatment   3. Has this occurred multiple times? 2 times in a month  4. Did you have any symptoms prior to passing out? No

## 2019-06-15 NOTE — Progress Notes (Signed)
Virtual Visit via Telephone Note   This visit type was conducted due to national recommendations for restrictions regarding the COVID-19 Pandemic (e.g. social distancing) in an effort to limit this patient's exposure and mitigate transmission in our community.  Due to her co-morbid illnesses, this patient is at least at moderate risk for complications without adequate follow up.  This format is felt to be most appropriate for this patient at this time.  The patient did not have access to video technology/had technical difficulties with video requiring transitioning to audio format only (telephone).  All issues noted in this document were discussed and addressed.  No physical exam could be performed with this format.  Please refer to the patient's chart for her  consent to telehealth for [facility].   Date:  06/17/2019   ID:  [name], DOB 09-01-1950, MRN [id number]  Patient Location: Home Provider Location: Home  PCP:  [name], MD  Cardiologist:  Quay Burow, MD  Electrophysiologist:  Constance Haw, MD  Nephrologist: Dr. Justin Mend  Evaluation Performed:  Follow-Up Visit  Chief Complaint:  followup  History of Present Illness:    [name] is a 68 y.o. female with past medical history of hypertension, hyperlipidemia, DM 2, chronic systolic heart failure, CKD stage IV with dialysis fistula placed by Dr. Trula Slade, and coronary calcification that was seen on previous CT scan in April 2019.  She has very strong family history of CAD. Echocardiogram obtained on 03/02/2018 showed EF 40%, diffuse hypokinesis, grade 1 DD, ascending aorta measuring at 37 mm.  Myoview obtained on 02/25/2018 showed EF 43%, low risk Myoview with no ischemia or infarction.  Previous heart monitor obtained in January 2020 showed runs of nonsustained VT, therefore she was referred to Dr. Curt Bears for further evaluation.  She had significant fatigue on carvedilol after dose increase, this was later  switched to Toprol-XL which greatly improved her overall palpitation.  Patient was contacted today via telephone visit to assess labile blood pressure.  She started on dialysis for the first time 3 weeks ago.  She is currently on Tuesday, Thursday and Saturday schedule.  She is being followed by Dr[facility].  She says her doctor has taken her off of all blood pressure medication recently.  On further investigation she says she passed out a month ago prior to the initiation of dialysis.  She mentions she was sitting in her shop at the time when that happened and that she slipped in her chair.  She denies any chest pain prior to the episode and she did not try to seek medical attention afterward.  She had another syncope last Thursday 2 hours into the dialysis.  She was told her blood pressure was quite low in the 60s.  I suspect she is having presyncope and syncope because of significant drop in the blood pressure.  Unfortunately her blood pressure cuff at home ran out of battery and she is unable to tell me any numbers.  I recommended she keep a blood pressure diary with twice a day reading and do a reassessment in a few weeks.  I plan to call her next week to further investigate her blood pressure issue.  She will need to bring all her blood pressure medication bottles and her blood pressure cuff with her to the next office visit.  If the symptoms occur again, I may consider a repeat 30-day event monitor especially since there is no way to suppress the  nonsustained VT anymore since she was taken off of her beta-blocker.  The patient does not have symptoms concerning for COVID-19 infection (fever, chills, cough, or new shortness of breath).    Past Medical History:  Diagnosis Date   Anemia    Anemia associated with chronic renal failure    Anemia in chronic kidney disease 09/29/2018   Arthritis    Asthma    Cataract    OD   CKD stage 3 due to type 2 diabetes mellitus ([place])     Coronary artery calcification seen on CAT scan 02/17/2018   Coronary calcification on CT   Depression    Diabetic retinopathy of both eyes (HCC)    Diabetic ulcer of right foot associated with type 2 diabetes mellitus ([place])    Essential hypertension 02/17/2018   Essential hypertension   GERD (gastroesophageal reflux disease)    pt denies   HLD (hyperlipidemia)    Hypertension    Hypertensive retinopathy    OU   Left ventricular dysfunction 04/07/2018   Left ventricle dysfunction   Microalbuminuria due to type 2 diabetes mellitus ([place])    Neuromuscular disorder ([place])    diabetic neuropathy   Nonproliferative retinopathy due to secondary diabetes ([place])    Nonsustained ventricular tachycardia ([place]) 08/28/2018   Nonsustained ventricular tachycardia   Pneumonia    hx of    Renal cell cancer ([place])    Right renal mass 03/10/2017   Type 2 diabetes, controlled, with neuropathy ([place]) 06/22/2013   Ulcer of other part of foot 06/22/2013   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 [facility])    Past Surgical History:  Procedure Laterality Date   BASCILIC VEIN TRANSPOSITION Left 08/08/2017   Procedure: BASILIC VEIN TRANSPOSITION FIRST STAGE LEFT ARM;  Surgeon: Serafina Mitchell, MD;  Location: [place];  Service: Vascular;  Laterality: Left;   [place] Left 05/14/2018   Procedure: SECOND STAGE BASILIC VEIN TRANSPOSITION LEFT ARM;  Surgeon: Serafina Mitchell, MD;  Location: [place];  Service: Vascular;  Laterality: Left;   CATARACT EXTRACTION Left    EYE SURGERY     ROBOTIC ADRENALECTOMY Left 03/10/2017   Procedure: XI ROBOTIC ADRENALECTOMY;  Surgeon: Nickie Retort, MD;  Location: [facility];  Service: Urology;  Laterality: Left;   ROBOTIC ASSITED PARTIAL NEPHRECTOMY Right 03/10/2017   Procedure: XI ROBOTIC ASSITED RADICAL NEPHRECTOMY;  Surgeon: Nickie Retort, MD;  Location: [facility];  Service: Urology;  Laterality: Right;   TOE AMPUTATION  Bilateral    both great toe ,, left foot 2nd toe 1/2     Current Meds  Medication Sig   Blood Glucose Monitoring Suppl (ONE TOUCH ULTRA 2) w/Device KIT Use to check BS BID-QID Dx:E11.9   BREO ELLIPTA 100-25 MCG/INH AEPB Inhale 1 puff by mouth once daily   gabapentin (NEURONTIN) 300 MG capsule TAKE 1 CAPSULE BY MOUTH THREE TIMES DAILY (Patient taking differently: Take 300 mg by mouth as needed. )   Insulin Pen Needle (LIVE BETTER PEN NEEDLES) 31G X 6 MM MISC To use with Tresiba pens daily   Lancets (ONETOUCH ULTRASOFT) lancets Use as instructed   ONETOUCH ULTRA test strip USE AS DIRECTED TO MONITOR  FSBS 3 TIMES DAILY   PROAIR HFA 108 (90 Base) MCG/ACT inhaler Inhale 2 puffs into the lungs every 6 (six) hours as needed for wheezing or shortness of breath.   rosuvastatin (CRESTOR) 20 MG tablet Take 1 tablet by mouth once daily   TRESIBA FLEXTOUCH 100 UNIT/ML  SOPN FlexTouch Pen INJECT 150 UNITS INTO THE SKIN ONCE DAILY   Current Facility-Administered Medications for the 06/15/19 encounter (Telemedicine) with Almyra Deforest, PA  Medication   Bevacizumab (AVASTIN) SOLN 1.25 mg   Bevacizumab (AVASTIN) SOLN 1.25 mg   Bevacizumab (AVASTIN) SOLN 1.25 mg   Bevacizumab (AVASTIN) SOLN 1.25 mg   Bevacizumab (AVASTIN) SOLN 1.25 mg     Allergies:   Penicillins and Adhesive [tape]   Social History   Tobacco Use   Smoking status: Never Smoker   Smokeless tobacco: Never Used  Substance Use Topics   Alcohol use: Yes    Alcohol/week: 0.0 standard drinks    Comment: occ   Drug use: No     Family Hx: The patient's family history includes Diabetes in her sister; Heart disease in her mother.  ROS:   Please see the history of present illness.     All other systems reviewed and are negative.   Prior CV studies:   The following studies were reviewed today:  Echo 03/02/2018 LV EF: 40% Study Conclusions  - Procedure narrative: Transthoracic echocardiography. Image   quality  was poor. The study was technically difficult.   Intravenous contrast (Definity) was administered to opacify the   LV. - Left ventricle: The cavity size was normal. Wall thickness was   increased in a pattern of mild LVH. The estimated ejection   fraction was 40%. Diffuse hypokinesis. Doppler parameters are   consistent with abnormal left ventricular relaxation (grade 1   diastolic dysfunction). - Aortic valve: There was no stenosis. - Aorta: Ascending aortic diameter: 37 mm (S). - Ascending aorta: The ascending aorta was borderline dilated. - Mitral valve: Mildly calcified annulus. There was no significant   regurgitation. - Left atrium: The atrium was mildly dilated. - Right ventricle: The cavity size was normal. Systolic function   was normal. - Right atrium: The atrium was mildly dilated. - Pulmonary arteries: No complete TR doppler jet so unable to   estimate PA systolic pressure. - Inferior vena cava: The vessel was normal in size. The   respirophasic diameter changes were in the normal range (>= 50%),   consistent with normal central venous pressure. - Pericardium, extracardiac: A trivial pericardial effusion was   identified posterior to the heart.  Impressions:  - Normal LV size with mild LV hypertrophy. EF 40%, diffuse   hypokinesis. Normal RV size and systolic function. No significant   valvular abnormalities.  Labs/Other Tests and Data Reviewed:    EKG:  An ECG dated 08/28/2018 was personally reviewed today and demonstrated:  Normal sinus rhythm with poor R wave progression anterior leads.  Recent Labs: 05/06/2019: ALT 10; BUN 84; Creat 5.56; Platelets 165; Potassium 4.9; Sodium 143 05/21/2019: Hemoglobin 9.4   Recent Lipid Panel Lab Results  Component Value Date/Time   CHOL 115 05/06/2019 12:29 PM   TRIG 124 05/06/2019 12:29 PM   HDL 29 (L) 05/06/2019 12:29 PM   CHOLHDL 4.0 05/06/2019 12:29 PM   LDLCALC 65 05/06/2019 12:29 PM    Wt Readings from Last 3  Encounters:  06/15/19 229 lb 0.9 oz (103.9 kg)  05/10/19 238 lb (108 kg)  12/29/18 248 lb (112.5 kg)     Objective:    Vital Signs:  Ht _0  (1.702 m)    Wt 229 lb 0.9 oz (103.9 kg)    BMI 35.88 kg/m    VITAL SIGNS:  reviewed  ASSESSMENT & PLAN:    1. Hypertension: She says she has been  having labile blood pressure issues and she was taken off of all of her blood pressure medication by her physician.  I asked her to keep a blood pressure diary and bring her blood pressure cuff and all medication bottles to the next visit.  I plan to call her in 1 week to get some readings off of her blood pressure diary and to make a decision on whether or not we need to treat her hypertension on non-dialysis days.  2. Syncope: She had at least 2 episode of syncope.  Is the first episode was over a month ago and that she was sitting in her shop when that happened.  She does not remember trying to stand up.  Second episode was last Thursday, she says she was 2 hours into her dialysis when her blood pressure bottomed out.  If symptoms does recur, we may need to consider another 30-day event monitor to rule out prolonged VT episode  3. Nonsustained VT: Previously seen on heart monitor in February, unfortunately her beta-blocker was taken off due to low blood pressure recently.  4. Hyperlipidemia continue Crestor  5. DM2: On insulin managed by primary care provider  6. Chronic systolic heart failure: Volume managed by dialysis  7. CKD stage V: She has started on dialysis 3 weeks ago.  COVID-19 Education: The signs and symptoms of COVID-19 were discussed with the patient and how to seek care for testing (follow up with PCP or arrange E-visit).  The importance of social distancing was discussed today.  Time:   Today, I have spent 25 minutes with the patient with telehealth technology discussing the above problems.     Medication Adjustments/Labs and Tests Ordered: Current medicines are reviewed at  length with the patient today.  Concerns regarding medicines are outlined above.   Tests Ordered: No orders of the defined types were placed in this encounter.   Medication Changes: No orders of the defined types were placed in this encounter.   Follow Up:  In Person in 3 week(s)  Signed, Almyra Deforest, [place]  06/17/2019 11:14 AM    [place]

## 2019-06-15 NOTE — Patient Instructions (Addendum)
Medication Instructions:   Your physician recommends that you continue on your current medications as directed. Please refer to the Current Medication list given to you today.  *If you need a refill on your cardiac medications before your next appointment, please call your pharmacy*  Lab Work:  NONE ordered at this time of appointment   If you have labs (blood work) drawn today and your tests are completely normal, you will receive your results only by: Marland Kitchen MyChart Message (if you have MyChart) OR . A paper copy in the mail If you have any lab test that is abnormal or we need to change your treatment, we will call you to review the results.  Testing/Procedures:  NONE ordered at this time of appointment   Follow-Up: At [facility], you and your health needs are our priority.  As part of our continuing mission to provide you with exceptional heart care, we have created designated Provider Care Teams.  These Care Teams include your primary Cardiologist (physician) and Advanced Practice Providers (APPs -  Physician Assistants and Nurse Practitioners) who all work together to provide you with the care you need, when you need it.  Your next appointment:   3 week(s)  The format for your next appointment:   In Person  Provider:   Almyra Deforest, PA-C  Other Instructions  Keep a blood pressure (BP) dairy/log, have 2 readings a day  Bring your BP diary/log to your next visit along with your medication bottles and blood pressure machine

## 2019-06-15 NOTE — Telephone Encounter (Signed)
I will route to NL as Dr. Gwenlyn Found is pt's primary card

## 2019-06-16 ENCOUNTER — Telehealth: Payer: Self-pay

## 2019-06-16 DIAGNOSIS — D689 Coagulation defect, unspecified: Secondary | ICD-10-CM | POA: Diagnosis not present

## 2019-06-16 DIAGNOSIS — E1129 Type 2 diabetes mellitus with other diabetic kidney complication: Secondary | ICD-10-CM | POA: Diagnosis not present

## 2019-06-16 DIAGNOSIS — Z23 Encounter for immunization: Secondary | ICD-10-CM | POA: Diagnosis not present

## 2019-06-16 DIAGNOSIS — Z992 Dependence on renal dialysis: Secondary | ICD-10-CM | POA: Diagnosis not present

## 2019-06-16 DIAGNOSIS — D509 Iron deficiency anemia, unspecified: Secondary | ICD-10-CM | POA: Diagnosis not present

## 2019-06-16 DIAGNOSIS — D631 Anemia in chronic kidney disease: Secondary | ICD-10-CM | POA: Diagnosis not present

## 2019-06-16 DIAGNOSIS — N186 End stage renal disease: Secondary | ICD-10-CM | POA: Diagnosis not present

## 2019-06-16 DIAGNOSIS — N2581 Secondary hyperparathyroidism of renal origin: Secondary | ICD-10-CM | POA: Diagnosis not present

## 2019-06-16 NOTE — Telephone Encounter (Signed)
Left a detailed message for the patient informing that I was calling to get her scheduled for a 3 week in person follow up with Almyra Deforest, PA-C and to go over the recommendations from Almyra Deforest, PA-C from her virtual visit on 06/15/19.

## 2019-06-19 DIAGNOSIS — N186 End stage renal disease: Secondary | ICD-10-CM | POA: Diagnosis not present

## 2019-06-19 DIAGNOSIS — N2581 Secondary hyperparathyroidism of renal origin: Secondary | ICD-10-CM | POA: Diagnosis not present

## 2019-06-19 DIAGNOSIS — E1129 Type 2 diabetes mellitus with other diabetic kidney complication: Secondary | ICD-10-CM | POA: Diagnosis not present

## 2019-06-19 DIAGNOSIS — Z992 Dependence on renal dialysis: Secondary | ICD-10-CM | POA: Diagnosis not present

## 2019-06-19 DIAGNOSIS — D689 Coagulation defect, unspecified: Secondary | ICD-10-CM | POA: Diagnosis not present

## 2019-06-19 DIAGNOSIS — D631 Anemia in chronic kidney disease: Secondary | ICD-10-CM | POA: Diagnosis not present

## 2019-06-19 DIAGNOSIS — D509 Iron deficiency anemia, unspecified: Secondary | ICD-10-CM | POA: Diagnosis not present

## 2019-06-19 DIAGNOSIS — Z23 Encounter for immunization: Secondary | ICD-10-CM | POA: Diagnosis not present

## 2019-06-21 DIAGNOSIS — N186 End stage renal disease: Secondary | ICD-10-CM | POA: Diagnosis not present

## 2019-06-21 DIAGNOSIS — I129 Hypertensive chronic kidney disease with stage 1 through stage 4 chronic kidney disease, or unspecified chronic kidney disease: Secondary | ICD-10-CM | POA: Diagnosis not present

## 2019-06-21 DIAGNOSIS — Z992 Dependence on renal dialysis: Secondary | ICD-10-CM | POA: Diagnosis not present

## 2019-06-22 DIAGNOSIS — N186 End stage renal disease: Secondary | ICD-10-CM | POA: Diagnosis not present

## 2019-06-22 DIAGNOSIS — D509 Iron deficiency anemia, unspecified: Secondary | ICD-10-CM | POA: Diagnosis not present

## 2019-06-22 DIAGNOSIS — D689 Coagulation defect, unspecified: Secondary | ICD-10-CM | POA: Diagnosis not present

## 2019-06-22 DIAGNOSIS — D631 Anemia in chronic kidney disease: Secondary | ICD-10-CM | POA: Diagnosis not present

## 2019-06-22 DIAGNOSIS — Z283 Underimmunization status: Secondary | ICD-10-CM | POA: Diagnosis not present

## 2019-06-22 DIAGNOSIS — N2581 Secondary hyperparathyroidism of renal origin: Secondary | ICD-10-CM | POA: Diagnosis not present

## 2019-06-22 DIAGNOSIS — Z992 Dependence on renal dialysis: Secondary | ICD-10-CM | POA: Diagnosis not present

## 2019-06-23 ENCOUNTER — Telehealth: Payer: Self-pay | Admitting: Physician Assistant

## 2019-06-23 NOTE — Telephone Encounter (Signed)
I spoke with Mrs. Forsee, she continue to have labile BP. Her SBP range between 100-140s. Unfortunately, she was unable to get her BP monitor to work properly last week, therefore, she did not provide me with many BP readings. She did mention she has been noting some irregular heart beat warming on her BP cuff.   I recommend she continue to document her BP. I want to bring her back to the office next Wednesday or Friday for EKG, orthostatic vital signs and BP followup.   Will forward to my staff and scheduler to arrange visit.

## 2019-06-24 DIAGNOSIS — D689 Coagulation defect, unspecified: Secondary | ICD-10-CM | POA: Diagnosis not present

## 2019-06-24 DIAGNOSIS — Z992 Dependence on renal dialysis: Secondary | ICD-10-CM | POA: Diagnosis not present

## 2019-06-24 DIAGNOSIS — N186 End stage renal disease: Secondary | ICD-10-CM | POA: Diagnosis not present

## 2019-06-24 DIAGNOSIS — D509 Iron deficiency anemia, unspecified: Secondary | ICD-10-CM | POA: Diagnosis not present

## 2019-06-24 DIAGNOSIS — D631 Anemia in chronic kidney disease: Secondary | ICD-10-CM | POA: Diagnosis not present

## 2019-06-24 DIAGNOSIS — Z283 Underimmunization status: Secondary | ICD-10-CM | POA: Diagnosis not present

## 2019-06-24 DIAGNOSIS — N2581 Secondary hyperparathyroidism of renal origin: Secondary | ICD-10-CM | POA: Diagnosis not present

## 2019-06-24 NOTE — Progress Notes (Addendum)
[facility] Note  06/25/2019     CHIEF COMPLAINT Patient presents for Retina Follow Up   HISTORY OF PRESENT ILLNESS: [name] is a 68 y.o. female who presents to the clinic today for:   HPI    Retina Follow Up    Patient presents with  Diabetic Retinopathy.  In both eyes.  This started weeks ago.  Severity is moderate.  Duration of weeks.  Since onset it is stable.  I, the attending physician,  performed the HPI with the patient and updated documentation appropriately.          Comments    Pt states she has a spot in her vision in her right eye this morning that she cant get to go away--she states it does not have a color.  Left eye is stable per patient.   Patient denies eye pain or discomfort.        Last edited by [name], MD on 06/25/2019 10:22 AM. (History)    pt states she is doing well, she is having trouble walking today  Referring physician: [name], MD [place],  [place] [address]  HISTORICAL INFORMATION:   Selected notes from the MEDICAL RECORD NUMBER Referred by Dr. [name] for concern of BRVO OS LEE: 10.03.19 (C. Weaver) [BCVA: OD: 20/30 OS: 20/50] Ocular Hx-cataracts OU, HTR OU, NPDR OU, DES PMH-DM (O5D: 7.3, takes Trulicity and [place]), HTN, HLD     CURRENT MEDICATIONS: No current outpatient medications on file. (Ophthalmic Drugs)   No current facility-administered medications for this visit.  (Ophthalmic Drugs)   Current Outpatient Medications (Other)  Medication Sig  . AURYXIA 1 GM 210 MG(Fe) tablet Take 210 mg by mouth 3 (three) times daily.  . Blood Glucose Monitoring Suppl (ONE TOUCH ULTRA 2) w/Device KIT Use to check BS BID-QID Dx:E11.9  . BREO ELLIPTA 100-25 MCG/INH AEPB Inhale 1 puff by mouth once daily  . calcitRIOL (ROCALTROL) 0.5 MCG capsule Take 0.5 mcg by mouth daily.  . carvedilol (COREG) 12.5 MG tablet Take 12.5 mg by mouth 2 (two) times daily.  Marland Kitchen gabapentin  (NEURONTIN) 300 MG capsule TAKE 1 CAPSULE BY MOUTH THREE TIMES DAILY (Patient taking differently: Take 300 mg by mouth as needed. )  . Insulin Pen Needle (LIVE BETTER PEN NEEDLES) 31G X 6 MM MISC To use with Tresiba pens daily  . isosorbide dinitrate (ISORDIL) 30 MG tablet Take 30 mg by mouth daily.  . Lancets (ONETOUCH ULTRASOFT) lancets Use as instructed  . ONETOUCH ULTRA test strip USE AS DIRECTED TO MONITOR  FSBS 3 TIMES DAILY  . PROAIR HFA 108 (90 Base) MCG/ACT inhaler Inhale 2 puffs into the lungs every 6 (six) hours as needed for wheezing or shortness of breath.  . rosuvastatin (CRESTOR) 20 MG tablet Take 1 tablet by mouth once daily  . TRESIBA FLEXTOUCH 100 UNIT/ML SOPN FlexTouch Pen INJECT 150 UNITS INTO THE SKIN ONCE DAILY   Current Facility-Administered Medications (Other)  Medication Route  . Bevacizumab (AVASTIN) SOLN 1.25 mg Intravitreal  . Bevacizumab (AVASTIN) SOLN 1.25 mg Intravitreal  . Bevacizumab (AVASTIN) SOLN 1.25 mg Intravitreal  . Bevacizumab (AVASTIN) SOLN 1.25 mg Intravitreal  . Bevacizumab (AVASTIN) SOLN 1.25 mg Intravitreal      REVIEW OF SYSTEMS: ROS    Positive for: Genitourinary, Endocrine, Cardiovascular, Eyes   Negative for: Constitutional, Gastrointestinal, Neurological, Skin, Musculoskeletal, HENT, Respiratory, Psychiatric, Allergic/Imm, Heme/Lymph   Last edited by Leeann Must  L on 06/25/2019  9:55 AM. (History)       ALLERGIES Allergies  Allergen Reactions  . Penicillins Other (See Comments)    UNSPECIFIED CHILDHOOD REACTION  Has patient had a PCN reaction causing immediate rash, facial/tongue/throat swelling, SOB or lightheadedness with hypotension: Unknown Has patient had a PCN reaction causing severe rash involving mucus membranes or skin necrosis: Unknown Has patient had a PCN reaction that required hospitalization: Unknown Has patient had a PCN reaction occurring within the last 10 years: Unknown If all of the above answers are "NO",  then may proceed with Cephalosporin use.   . Adhesive [Tape] Rash    PAST MEDICAL HISTORY Past Medical History:  Diagnosis Date  . Anemia   . Anemia associated with chronic renal failure   . Anemia in chronic kidney disease 09/29/2018  . Arthritis   . Asthma   . Cataract    OD  . CKD stage 3 due to type 2 diabetes mellitus ([place])   . Coronary artery calcification seen on CAT scan 02/17/2018   Coronary calcification on CT  . Depression   . Diabetic retinopathy of both eyes ([place])   . Diabetic ulcer of right foot associated with type 2 diabetes mellitus ([place])   . Essential hypertension 02/17/2018   Essential hypertension  . GERD (gastroesophageal reflux disease)    pt denies  . HLD (hyperlipidemia)   . Hypertension   . Hypertensive retinopathy    OU  . Left ventricular dysfunction 04/07/2018   Left ventricle dysfunction  . Microalbuminuria due to type 2 diabetes mellitus ([place])   . Neuromuscular disorder ([place])    diabetic neuropathy  . Nonproliferative retinopathy due to secondary diabetes ([place])   . Nonsustained ventricular tachycardia ([place]) 08/28/2018   Nonsustained ventricular tachycardia  . Pneumonia    hx of   . Renal cell cancer ([place])   . Right renal mass 03/10/2017  . Type 2 diabetes, controlled, with neuropathy ([place]) 06/22/2013  . Ulcer of other part of foot 06/22/2013  . 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 BASCILIC VEIN TRANSPOSITION Left 08/08/2017   Procedure: BASILIC VEIN TRANSPOSITION FIRST STAGE LEFT ARM;  Surgeon: Serafina Mitchell, MD;  Location: [facility];  Service: Vascular;  Laterality: Left;  . BASCILIC VEIN TRANSPOSITION Left 05/14/2018   Procedure: SECOND STAGE BASILIC VEIN TRANSPOSITION LEFT ARM;  Surgeon: Serafina Mitchell, MD;  Location: [facility];  Service: Vascular;  Laterality: Left;  . CATARACT EXTRACTION Left   . EYE SURGERY    . ROBOTIC ADRENALECTOMY Left 03/10/2017   Procedure:  XI ROBOTIC ADRENALECTOMY;  Surgeon: Nickie Retort, MD;  Location: [facility];  Service: Urology;  Laterality: Left;  . ROBOTIC ASSITED PARTIAL NEPHRECTOMY Right 03/10/2017   Procedure: XI ROBOTIC ASSITED RADICAL NEPHRECTOMY;  Surgeon: Nickie Retort, MD;  Location: [facility];  Service: Urology;  Laterality: Right;  . TOE AMPUTATION Bilateral    both great toe ,, left foot 2nd toe 1/2    FAMILY HISTORY Family History  Problem Relation Age of Onset  . Heart disease Mother   . Diabetes Sister     SOCIAL HISTORY Social History   Tobacco Use  . Smoking status: Never Smoker  . Smokeless tobacco: Never Used  Substance Use Topics  . Alcohol use: Yes    Alcohol/week: 0.0 standard drinks    Comment: occ  . Drug use: No  OPHTHALMIC EXAM:  Base Eye Exam    Visual Acuity (Snellen - Linear)      Right Left   Dist [place] 20/60 -1 20/25 -1   Dist ph [place] 20/30 -2 20/25       Tonometry (Tonopen, 10:00 AM)      Right Left   Pressure 11 14       Pupils      Dark Light Shape React APD   Right 3 2 Round Minimal 0   Left 3 2 Round Minimal 0       Visual Fields      Left Right    Full Full       Extraocular Movement      Right Left    Full Full       Neuro/Psych    Oriented x3: Yes   Mood/Affect: Normal       Dilation    Both eyes: 2.5% Phenylephrine, 1.0% Mydriacyl @ 10:00 AM        Slit Lamp and Fundus Exam    Slit Lamp Exam      Right Left   Lids/Lashes Dermatochalasis - upper lid, mild MGD Dermatochalasis - upper lid, Telangiectasia, mild MGD   Conjunctiva/Sclera White and quiet White and quiet   Cornea 1+ Punctate epithelial erosions, mild Arcus Well-healed temporal cataract wound   Anterior Chamber Deep and quiet Deep   Iris Round and dilated, No NVI Round and well dilated   Lens 2-3+ Nuclear sclerosis with early brunescence, 3+ Cortical cataract mild PCIOL in good position   Vitreous Vitreous syneresis, Posterior vitreous detachment Vitreous  syneresis       Fundus Exam      Right Left   Disc Pink and Sharp trace pallor, Sharp rim   C/D Ratio 0.3 0.2   Macula good foveal reflex, scattered MA and IRH greatest nasally, +cystic changes nasal macula Blunted foveal reflex, scattered DBH, small cluster of DBH with edema, IN macula - improved   Vessels Vascular attenuation, Tortuous, AV crossing changes Tortuous, Vascular attenuation, AV crossing changes   Periphery Attached, scattered IRH greatest posteriorly and small CWS nasal to disc - fading Attached, scattered MA/DBH          IMAGING AND PROCEDURES  Imaging and Procedures for '@TODAY'$ @  OCT, Retina - OU - Both Eyes       Right Eye Quality was good. Central Foveal Thickness: 246. Progression has been stable. Findings include normal foveal contour, no SRF, no IRF (Persistent peripapillary cystic changes nasal macula).   Left Eye Quality was good. Central Foveal Thickness: 255. Progression has improved. Findings include normal foveal contour, intraretinal fluid, no SRF, vitreomacular adhesion , intraretinal hyper-reflective material (interval improvement in non-central IRF, inferonasal to fovea).   Notes *Images captured and stored on drive  Diagnosis / Impression:  NFP; +IRF; no SRF OU noncentral DME OU (OS > OD) OS - interval improvement in non-central IRF, inferonasal to fovea  Clinical management:  See below  Abbreviations: NFP - Normal foveal profile. CME - cystoid macular edema. PED - pigment epithelial detachment. IRF - intraretinal fluid. SRF - subretinal fluid. EZ - ellipsoid zone. ERM - epiretinal membrane. ORA - outer retinal atrophy. ORT - outer retinal tubulation. SRHM - subretinal hyper-reflective material        Intravitreal Injection, Pharmacologic Agent - OS - Left Eye       Time Out 06/25/2019. 10:59 AM. Confirmed correct patient, procedure, site, and patient consented.   Anesthesia Topical  anesthesia was used. Anesthetic medications included  Lidocaine 2%, Proparacaine 0.5%.   Procedure Preparation included 5% betadine to ocular surface, eyelid speculum. A 30 gauge needle was used.   Injection:  1.25 mg Bevacizumab (AVASTIN) SOLN   NDC: 62130-865-78, Lot: [id number]'$ , Expiration date: 07/28/2019   Route: Intravitreal, Site: Left Eye, Waste: 0 mL  Post-op Post injection exam found visual acuity of at least counting fingers. The patient tolerated the procedure well. There were no complications. The patient received written and verbal post procedure care education.                 ASSESSMENT/PLAN:    ICD-10-CM   1. Moderate nonproliferative diabetic retinopathy of both eyes with macular edema associated with type 2 diabetes mellitus (HCC)  14/12/2019 Intravitreal Injection, Pharmacologic Agent - OS - Left Eye    Bevacizumab (AVASTIN) SOLN 1.25 mg  2. Retinal edema  H35.81 OCT, Retina - OU - Both Eyes  3. Essential hypertension  I10   4. Hypertensive retinopathy of both eyes  H35.033   5. Combined forms of age-related cataract of right eye  H25.811   6. Pseudophakia  Z96.1     1,2 Moderate non-proliferative diabetic retinopathy w/ DME OU (OS > OD)  - Initial exam shows scattered MA, BCVA 20/50 OS  - Initial FA (05/12/18) shows leaking MA, no NV OS  - Initial OCT showed diabetic macular edema OU (OS > OD)   - s/p IVA #1 OS (10.22.19), #2 (11.20.19), #3 (12.20.19), #4 (01.31.20), #5 (03.13.20), #6 (04.22.20), #7 (06.05.20), #8 (07.17.20), #9 (08.28.20),#10 (10.07.20)  - today BCVA 20/30 OD (mostly limited by cataract), 20/25 OS   - OCT - interval improvement in inf nasal IRF OS  - recommend IVA #11 OS today, 12.04.20 w/ extension to 10 wks  - discussed possible need for IVA OD in the future  - pt wishes to proceed  - RBA of procedure discussed, questions answered  - informed consent obtained and signed  - see procedure note  - f/u in 10 wks, DFE, OCT, possible injxn OU  3,4. Hypertensive retinopathy OU  - discussed  importance of tight BP control  - monitor  5. Combined Form Cataracts OD  - The symptoms of cataract, surgical options, and treatments and risks were discussed with patient.  - discussed diagnosis and progression  - visually significant OD  - under the expert management of Dr. 25.04.20  - clear from a retina standpoint to proceed with cataract surgery when pt and surgeon are ready  - recommend coordinating care so that pt receives anti-VEGF 1-2 wks prior to cataract surgery, if possible  - patient states she's waiting to schedule surgery based on finances  6. Pseudophakia OS  - s/p CE/IOL OS w/ Dr. [name]  - beautiful surgery, doing well  - monitor   Ophthalmic Meds Ordered this visit:  Meds ordered this encounter  Medications  . Bevacizumab (AVASTIN) SOLN 1.25 mg       Return in about 10 weeks (around 09/03/2019) for f/u NPDR OU, DFE, OCT.  There are no Patient Instructions on file for this visit.   Explained the diagnoses, plan, and follow up with the patient and they expressed understanding.  Patient expressed understanding of the importance of proper follow up care.   This document serves as a record of services personally performed by 15/06/2020, MD, PhD. It was created on their behalf by Gardiner Sleeper, OA, an ophthalmic assistant. The creation of this record is  the provider's dictation and/or activities during the visit.    Electronically signed by: Ernest Mallick, OA 12.03.2020 11:47 PM    Gardiner Sleeper, M.D., Ph.D. Diseases & Surgery of the Retina and Vitreous [name]   I have reviewed the above documentation for accuracy and completeness, and I agree with the above. Gardiner Sleeper, M.D., Ph.D. 06/25/19 11:47 PM    Abbreviations: M myopia (nearsighted); A astigmatism; H hyperopia (farsighted); P presbyopia; Mrx spectacle prescription;  CTL contact lenses; OD right eye; OS left eye; OU both eyes  XT exotropia; ET esotropia; PEK  punctate epithelial keratitis; PEE punctate epithelial erosions; DES dry eye syndrome; MGD meibomian gland dysfunction; ATs artificial tears; PFAT's preservative free artificial tears; [place] nuclear sclerotic cataract; PSC posterior subcapsular cataract; ERM epi-retinal membrane; PVD posterior vitreous detachment; RD retinal detachment; DM diabetes mellitus; DR diabetic retinopathy; NPDR non-proliferative diabetic retinopathy; PDR proliferative diabetic retinopathy; CSME clinically significant macular edema; DME diabetic macular edema; dbh dot blot hemorrhages; CWS cotton wool spot; POAG primary open angle glaucoma; C/D cup-to-disc ratio; HVF humphrey visual field; GVF goldmann visual field; OCT optical coherence tomography; IOP intraocular pressure; BRVO Branch retinal vein occlusion; CRVO central retinal vein occlusion; CRAO central retinal artery occlusion; BRAO branch retinal artery occlusion; RT retinal tear; SB scleral buckle; PPV pars plana vitrectomy; VH Vitreous hemorrhage; PRP panretinal laser photocoagulation; IVK intravitreal kenalog; VMT vitreomacular traction; MH Macular hole;  NVD neovascularization of the disc; NVE neovascularization elsewhere; AREDS age related eye disease study; ARMD age related macular degeneration; POAG primary open angle glaucoma; EBMD epithelial/anterior basement membrane dystrophy; ACIOL anterior chamber intraocular lens; IOL intraocular lens; PCIOL posterior chamber intraocular lens; Phaco/IOL phacoemulsification with intraocular lens placement; [place] photorefractive keratectomy; LASIK laser assisted in situ keratomileusis; HTN hypertension; DM diabetes mellitus; COPD chronic obstructive pulmonary disease

## 2019-06-25 ENCOUNTER — Encounter (INDEPENDENT_AMBULATORY_CARE_PROVIDER_SITE_OTHER): Payer: Self-pay | Admitting: Ophthalmology

## 2019-06-25 ENCOUNTER — Ambulatory Visit (INDEPENDENT_AMBULATORY_CARE_PROVIDER_SITE_OTHER): Payer: Medicare Other | Admitting: Ophthalmology

## 2019-06-25 DIAGNOSIS — I10 Essential (primary) hypertension: Secondary | ICD-10-CM

## 2019-06-25 DIAGNOSIS — Z961 Presence of intraocular lens: Secondary | ICD-10-CM

## 2019-06-25 DIAGNOSIS — H35033 Hypertensive retinopathy, bilateral: Secondary | ICD-10-CM | POA: Diagnosis not present

## 2019-06-25 DIAGNOSIS — H3581 Retinal edema: Secondary | ICD-10-CM

## 2019-06-25 DIAGNOSIS — H25811 Combined forms of age-related cataract, right eye: Secondary | ICD-10-CM | POA: Diagnosis not present

## 2019-06-25 DIAGNOSIS — E113313 Type 2 diabetes mellitus with moderate nonproliferative diabetic retinopathy with macular edema, bilateral: Secondary | ICD-10-CM | POA: Diagnosis not present

## 2019-06-25 MED ORDER — BEVACIZUMAB CHEMO INJECTION 1.25MG/0.05ML SYRINGE FOR KALEIDOSCOPE
1.2500 mg | INTRAVITREAL | Status: AC | PRN
  Administered 2019-06-25: 1.25 mg via INTRAVITREAL

## 2019-06-26 DIAGNOSIS — D631 Anemia in chronic kidney disease: Secondary | ICD-10-CM | POA: Diagnosis not present

## 2019-06-26 DIAGNOSIS — Z283 Underimmunization status: Secondary | ICD-10-CM | POA: Diagnosis not present

## 2019-06-26 DIAGNOSIS — D689 Coagulation defect, unspecified: Secondary | ICD-10-CM | POA: Diagnosis not present

## 2019-06-26 DIAGNOSIS — Z992 Dependence on renal dialysis: Secondary | ICD-10-CM | POA: Diagnosis not present

## 2019-06-26 DIAGNOSIS — D509 Iron deficiency anemia, unspecified: Secondary | ICD-10-CM | POA: Diagnosis not present

## 2019-06-26 DIAGNOSIS — N186 End stage renal disease: Secondary | ICD-10-CM | POA: Diagnosis not present

## 2019-06-26 DIAGNOSIS — N2581 Secondary hyperparathyroidism of renal origin: Secondary | ICD-10-CM | POA: Diagnosis not present

## 2019-06-29 DIAGNOSIS — D689 Coagulation defect, unspecified: Secondary | ICD-10-CM | POA: Diagnosis not present

## 2019-06-29 DIAGNOSIS — D631 Anemia in chronic kidney disease: Secondary | ICD-10-CM | POA: Diagnosis not present

## 2019-06-29 DIAGNOSIS — Z992 Dependence on renal dialysis: Secondary | ICD-10-CM | POA: Diagnosis not present

## 2019-06-29 DIAGNOSIS — N2581 Secondary hyperparathyroidism of renal origin: Secondary | ICD-10-CM | POA: Diagnosis not present

## 2019-06-29 DIAGNOSIS — Z283 Underimmunization status: Secondary | ICD-10-CM | POA: Diagnosis not present

## 2019-06-29 DIAGNOSIS — D509 Iron deficiency anemia, unspecified: Secondary | ICD-10-CM | POA: Diagnosis not present

## 2019-06-29 DIAGNOSIS — N186 End stage renal disease: Secondary | ICD-10-CM | POA: Diagnosis not present

## 2019-06-30 ENCOUNTER — Other Ambulatory Visit: Payer: Self-pay | Admitting: *Deleted

## 2019-06-30 NOTE — Patient Outreach (Signed)
[facility]) Care Management  06/30/2019  [name] [birth year] [id number]  CSW is covering for [employer], social work Social worker, also with [facility], in her absence, and will report all interventions made with patient directly to [name] for follow-up, upon her return to the office.   CSW made an initial attempt to try and contact patient today to perform the phone assessment, as well as assess and assist with social work needs and services, without success.  A HIPAA compliant message was left for patient on voicemail.  CSW is currently awaiting a return call.  CSW will make a second outreach attempt within the next 3-4 business days, if a return call is not received from patient in the meantime.  CSW will also mail an Outreach Letter to patient's home requesting that patient contact CSW if patient is interested in receiving social work services through [facility] with Scientist, clinical (histocompatibility and immunogenetics).   [name], BSW, MSW, LCSW  Licensed Education officer, [facility]  Mailing [place] [address] [place], [place] [address] Physical Address-[address], [place], [place] [address] Toll Free Main # [phone] Fax # [phone] Cell # [phone]  Office # [phone] [email or web address]

## 2019-07-01 ENCOUNTER — Other Ambulatory Visit: Payer: Self-pay | Admitting: Family Medicine

## 2019-07-01 DIAGNOSIS — D509 Iron deficiency anemia, unspecified: Secondary | ICD-10-CM | POA: Diagnosis not present

## 2019-07-01 DIAGNOSIS — Z283 Underimmunization status: Secondary | ICD-10-CM | POA: Diagnosis not present

## 2019-07-01 DIAGNOSIS — N186 End stage renal disease: Secondary | ICD-10-CM | POA: Diagnosis not present

## 2019-07-01 DIAGNOSIS — D631 Anemia in chronic kidney disease: Secondary | ICD-10-CM | POA: Diagnosis not present

## 2019-07-01 DIAGNOSIS — D689 Coagulation defect, unspecified: Secondary | ICD-10-CM | POA: Diagnosis not present

## 2019-07-01 DIAGNOSIS — Z992 Dependence on renal dialysis: Secondary | ICD-10-CM | POA: Diagnosis not present

## 2019-07-01 DIAGNOSIS — N2581 Secondary hyperparathyroidism of renal origin: Secondary | ICD-10-CM | POA: Diagnosis not present

## 2019-07-02 ENCOUNTER — Telehealth: Payer: Self-pay | Admitting: Radiology

## 2019-07-02 ENCOUNTER — Other Ambulatory Visit: Payer: Self-pay

## 2019-07-02 ENCOUNTER — Encounter: Payer: Self-pay | Admitting: Physician Assistant

## 2019-07-02 ENCOUNTER — Ambulatory Visit (INDEPENDENT_AMBULATORY_CARE_PROVIDER_SITE_OTHER): Payer: Medicare Other | Admitting: Physician Assistant

## 2019-07-02 VITALS — BP 105/99 | HR 50 | Ht 67.0 in | Wt 231.0 lb

## 2019-07-02 DIAGNOSIS — I472 Ventricular tachycardia, unspecified: Secondary | ICD-10-CM

## 2019-07-02 DIAGNOSIS — E119 Type 2 diabetes mellitus without complications: Secondary | ICD-10-CM | POA: Diagnosis not present

## 2019-07-02 DIAGNOSIS — E782 Mixed hyperlipidemia: Secondary | ICD-10-CM

## 2019-07-02 DIAGNOSIS — I10 Essential (primary) hypertension: Secondary | ICD-10-CM | POA: Diagnosis not present

## 2019-07-02 DIAGNOSIS — I2510 Atherosclerotic heart disease of native coronary artery without angina pectoris: Secondary | ICD-10-CM

## 2019-07-02 DIAGNOSIS — N186 End stage renal disease: Secondary | ICD-10-CM

## 2019-07-02 DIAGNOSIS — E46 Unspecified protein-calorie malnutrition: Secondary | ICD-10-CM | POA: Insufficient documentation

## 2019-07-02 DIAGNOSIS — I951 Orthostatic hypotension: Secondary | ICD-10-CM

## 2019-07-02 DIAGNOSIS — R55 Syncope and collapse: Secondary | ICD-10-CM | POA: Diagnosis not present

## 2019-07-02 NOTE — Progress Notes (Signed)
Cardiology Office Note:    Date:  07/05/2019   ID:  [name], DOB [birth year], MRN [id number]  PCP:  [name], MD  Cardiologist:  Quay Burow, MD  Electrophysiologist:  Constance Haw, MD  Nephrologist: Dr. Justin Mend  Referring MD: [name], MD   Chief Complaint  Patient presents with  . Follow-up    seen for Dr. Gwenlyn Found.    History of Present Illness:    [name] is a 68 y.o. female with a hx of hypertension, hyperlipidemia, DM 2, chronic systolic heart failure, CKD stage IV with dialysis fistula placed by Dr. Trula Slade, and coronary calcification that was seen on previous CT scan in April 2019.  She has very strong family history of CAD. Echocardiogram obtained on 03/02/2018 showed EF 40%, diffuse hypokinesis, grade 1 DD, ascending aorta measuring at 37 mm.  Myoview obtained on 02/25/2018 showed EF 43%, low risk Myoview with no ischemia or infarction.  Previous heart monitor obtained in January 2020 showed runs of nonsustained VT, therefore she was referred to Dr. Curt Bears for further evaluation.  She had significant fatigue on carvedilol after dose increase, this was later switched to Toprol-XL which greatly improved her overall palpitation.  I last saw the patient on 06/15/2019.  She informed me that she started her dialysis for the first time 3 weeks prior to that visit.  Her dialysis schedule is Tuesday, Thursday and Saturday.  Her physician has taken her off of all blood pressure medication prior to the office visit.  She also passed out at least twice.  One time was prior to the initiation dialysis she was sitting in her shop when she suddenly lost consciousness and the slipped in her chair.  The second episode occurred the week before our last office visit where she had syncope during dialysis and she was later told her blood pressure was quite low.   Patient presents today for cardiology office visit.  Her blood pressure remains borderline low.  We could not  get her up to the bed.  While sitting down, her blood pressure was 105/69, heart rate 50, with standing, her blood pressure dropped down to 92/60 and her heart rate jumped up to 116.  I suspect she is orthostatic with this drastic change in the heart rate and a drop in the blood pressure.  At this time, I do not recommend any scheduled blood pressure medication.  She is currently taking isosorbide mononitrate 30 mg on as-needed basis, I recommend she only take this medication if her systolic blood pressures greater than 170.  She appears to be euvolemic on physical exam.  I am concerned that she is having more VT at this point since she cannot take her Toprol-XL, I recommend 2-week Zio patch monitor.  Past Medical History:  Diagnosis Date  . Anemia   . Anemia associated with chronic renal failure   . Anemia in chronic kidney disease 09/29/2018  . Arthritis   . Asthma   . Cataract    OD  . CKD stage 3 due to type 2 diabetes mellitus ([place])   . Coronary artery calcification seen on CAT scan 02/17/2018   Coronary calcification on CT  . Depression   . Diabetic retinopathy of both eyes ([place])   . Diabetic ulcer of right foot associated with type 2 diabetes mellitus ([place])   . Essential hypertension 02/17/2018   Essential hypertension  . GERD (gastroesophageal reflux disease)    pt denies  . HLD (  hyperlipidemia)   . Hypertension   . Hypertensive retinopathy    OU  . Left ventricular dysfunction 04/07/2018   Left ventricle dysfunction  . Microalbuminuria due to type 2 diabetes mellitus ([place])   . Neuromuscular disorder ([place])    diabetic neuropathy  . Nonproliferative retinopathy due to secondary diabetes ([place])   . Nonsustained ventricular tachycardia ([place]) 08/28/2018   Nonsustained ventricular tachycardia  . Pneumonia    hx of   . Renal cell cancer ([place])   . Right renal mass 03/10/2017  . Type 2 diabetes, controlled, with neuropathy ([place]) 06/22/2013  . Ulcer of other part of foot 06/22/2013  .  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 BASCILIC VEIN TRANSPOSITION Left 08/08/2017   Procedure: BASILIC VEIN TRANSPOSITION FIRST STAGE LEFT ARM;  Surgeon: Serafina Mitchell, MD;  Location: [facility];  Service: Vascular;  Laterality: Left;  . BASCILIC VEIN TRANSPOSITION Left 05/14/2018   Procedure: SECOND STAGE BASILIC VEIN TRANSPOSITION LEFT ARM;  Surgeon: Serafina Mitchell, MD;  Location: [facility];  Service: Vascular;  Laterality: Left;  . CATARACT EXTRACTION Left   . EYE SURGERY    . ROBOTIC ADRENALECTOMY Left 03/10/2017   Procedure: XI ROBOTIC ADRENALECTOMY;  Surgeon: Nickie Retort, MD;  Location: [facility];  Service: Urology;  Laterality: Left;  . ROBOTIC ASSITED PARTIAL NEPHRECTOMY Right 03/10/2017   Procedure: XI ROBOTIC ASSITED RADICAL NEPHRECTOMY;  Surgeon: Nickie Retort, MD;  Location: [facility];  Service: Urology;  Laterality: Right;  . TOE AMPUTATION Bilateral    both great toe ,, left foot 2nd toe 1/2    Current Medications: Current Meds  Medication Sig  . AURYXIA 1 GM 210 MG(Fe) tablet Take 210 mg by mouth 3 (three) times daily.  . Blood Glucose Monitoring Suppl (ONE TOUCH ULTRA 2) w/Device KIT Use to check BS BID-QID Dx:E11.9  . BREO ELLIPTA 100-25 MCG/INH AEPB Inhale 1 puff by mouth once daily  . gabapentin (NEURONTIN) 300 MG capsule TAKE 1 CAPSULE BY MOUTH THREE TIMES DAILY (Patient taking differently: Take 300 mg by mouth as needed. )  . Insulin Pen Needle (LIVE BETTER PEN NEEDLES) 31G X 6 MM MISC To use with Tresiba pens daily  . isosorbide mononitrate (IMDUR) 30 MG 24 hr tablet Take 30 mg by mouth daily. TAKE 1 TABLET AS NEEDED IF SBP IS GREATER THAN 170  . Lancets (ONETOUCH ULTRASOFT) lancets Use as instructed  . ONETOUCH ULTRA test strip USE AS DIRECTED TO MONITOR  FSBS 3 TIMES DAILY  . PROAIR HFA 108 (90 Base) MCG/ACT inhaler Inhale 2 puffs into the lungs every 6 (six) hours as needed for  wheezing or shortness of breath.  . rosuvastatin (CRESTOR) 20 MG tablet Take 1 tablet by mouth once daily  . TRESIBA FLEXTOUCH 100 UNIT/ML SOPN FlexTouch Pen INJECT 150 UNITS INTO THE SKIN ONCE DAILY  . [DISCONTINUED] isosorbide dinitrate (ISORDIL) 30 MG tablet Take 30 mg by mouth daily.   Current Facility-Administered Medications for the 07/02/19 encounter (Office Visit) with Almyra Deforest, PA  Medication  . Bevacizumab (AVASTIN) SOLN 1.25 mg  . Bevacizumab (AVASTIN) SOLN 1.25 mg  . Bevacizumab (AVASTIN) SOLN 1.25 mg  . Bevacizumab (AVASTIN) SOLN 1.25 mg  . Bevacizumab (AVASTIN) SOLN 1.25 mg     Allergies:   Penicillins and Adhesive [tape]   Social History   Socioeconomic History  . Marital status: Single    Spouse name: Not on  file  . Number of children: Not on file  . Years of education: Not on file  . Highest education level: Not on file  Occupational History  . Not on file  Tobacco Use  . Smoking status: Never Smoker  . Smokeless tobacco: Never Used  Substance and Sexual Activity  . Alcohol use: Yes    Alcohol/week: 0.0 standard drinks    Comment: occ  . Drug use: No  . Sexual activity: Not Currently    Partners: Male  Other Topics Concern  . Not on file  Social History Narrative  . Not on file   Social Determinants of Health   Financial Resource Strain:   . Difficulty of Paying Living Expenses: Not on file  Food Insecurity:   . Worried About Charity fundraiser in the Last Year: Not on file  . Ran Out of Food in the Last Year: Not on file  Transportation Needs:   . Lack of Transportation (Medical): Not on file  . Lack of Transportation (Non-Medical): Not on file  Physical Activity:   . Days of Exercise per Week: Not on file  . Minutes of Exercise per Session: Not on file  Stress:   . Feeling of Stress : Not on file  Social Connections:   . Frequency of Communication with Friends and Family: Not on file  . Frequency of Social Gatherings with Friends and  Family: Not on file  . Attends Religious Services: Not on file  . Active Member of Clubs or Organizations: Not on file  . Attends Archivist Meetings: Not on file  . Marital Status: Not on file     Family History: The patient's family history includes Diabetes in her sister; Heart disease in her mother.  ROS:   Please see the history of present illness.     All other systems reviewed and are negative.  EKGs/Labs/Other Studies Reviewed:    The following studies were reviewed today:  Myoview 02/25/2018  The left ventricular ejection fraction is moderately decreased (30-44%).  Nuclear stress EF: 43%.  There was no ST segment deviation noted during stress.  This is a low risk study.   Abnormal, low risk stress nuclear study with no ischemia or infarction.  Gated ejection fraction 43% with diffuse hypokinesis.  However LV function appears better than calculated ejection fraction.  Moderate left ventricular enlargement.  Suggest echocardiogram to further assess.  EKG:  EKG is ordered today.  The ekg ordered today demonstrates mild atrial tachycardia, poor R wave progression anterior leads, occasional PVC and PAC.  Recent Labs: 05/06/2019: ALT 10; BUN 84; Creat 5.56; Platelets 165; Potassium 4.9; Sodium 143 05/21/2019: Hemoglobin 9.4  Recent Lipid Panel    Component Value Date/Time   CHOL 115 05/06/2019 1229   TRIG 124 05/06/2019 1229   HDL 29 (L) 05/06/2019 1229   CHOLHDL 4.0 05/06/2019 1229   VLDL 42 (H) 12/30/2016 1153   LDLCALC 65 05/06/2019 1229    Physical Exam:    VS:  BP (!) 105/99   Pulse (!) 50   Ht '5\' 7"'$  (1.702 m)   Wt 231 lb (104.8 kg)   BMI 36.18 kg/m     Wt Readings from Last 3 Encounters:  07/02/19 231 lb (104.8 kg)  06/15/19 229 lb 0.9 oz (103.9 kg)  05/10/19 238 lb (108 kg)     GEN:  Well nourished, well developed in no acute distress HEENT: Normal NECK: No JVD; No carotid bruits LYMPHATICS: No lymphadenopathy CARDIAC:  RRR, no  murmurs, rubs, gallops RESPIRATORY:  Clear to auscultation without rales, wheezing or rhonchi  ABDOMEN: Soft, non-tender, non-distended MUSCULOSKELETAL:  No edema; No deformity  SKIN: Warm and dry NEUROLOGIC:  Alert and oriented x 3 PSYCHIATRIC:  Normal affect   ASSESSMENT:    1. Syncope, unspecified syncope type   2. VT (ventricular tachycardia) ([place])   3. Essential hypertension   4. Mixed hyperlipidemia   5. Controlled type 2 diabetes mellitus without complication, without long-term current use of insulin ([place])   6. Coronary artery calcification seen on CAT scan   7. ESRD (end stage renal disease) ([place])   8. Orthostatic hypotension    PLAN:    In order of problems listed above:  1. Syncope: Patient recently had 2 episodes of syncope.  Unclear if her symptom is due to orthostatic hypotension versus recurrent VT.  Previous heart monitor in earlier 2020 did show nonsustained VT, however beta-blocker was taken off recently due to hypotension.  Plan to obtain heart monitor again to look for ventricular burden  2. Recurrent nonsustained VT: Seen on previous heart monitor.  Question if her recent syncope was related to ventricular tachycardia versus hypertension  3. Orthostatic hypotension: I suspect she has significant orthostatic hypotension due to massive fluid shift during dialysis.  We were unable to lay her down in the bed today, however she did have blood pressure drop from sitting position to standing position, although it was not significant enough to meet criteria for orthostasis  4. Coronary artery calcification: Denies any recent angina.  5. Hypertension: Off of all blood pressure medication since she started on dialysis.  Use Imdur on a as needed basis for systolic blood pressure greater than 170.  6. Hyperlipidemia: On Crestor  7. End-stage renal disease: On dialysis Tuesday, Thursday and Saturday   Medication Adjustments/Labs and Tests Ordered: Current medicines are  reviewed at length with the patient today.  Concerns regarding medicines are outlined above.  Orders Placed This Encounter  Procedures  . LONG TERM MONITOR-LIVE TELEMETRY (3-14 DAYS)  . EKG 12-Lead   No orders of the defined types were placed in this encounter.   Patient Instructions  Medication Instructions:   TAKE ISOSORBIDE MONONITRATE (IMDUR) 30 MG AS NEEDED IF SYSTOLIC (TOP NUMBER) BLOOD PRESSURE IS GREATER THAN 170 *If you need a refill on your cardiac medications before your next appointment, please call your pharmacy*  Lab Work: NONE ordered at this time of appointment   If you have labs (blood work) drawn today and your tests are completely normal, you will receive your results only by: Marland Kitchen MyChart Message (if you have MyChart) OR . A paper copy in the mail If you have any lab test that is abnormal or we need to change your treatment, we will call you to review the results.  Testing/Procedures:  This will be mailed to you, please expect 7-10 days to receive.  ZIO XT- Long Term Monitor Instructions   Your physician has requested you wear your ZIO patch monitor 14 days.   This is a single patch monitor.  Ir[employer] supplies one patch monitor per enrollment.  Additional stickers are not available.   Please do not apply patch if you will be having a Nuclear Stress Test, Echocardiogram, Cardiac CT, MRI, or Chest Xray during the time frame you would be wearing the monitor. The patch cannot be worn during these tests.  You cannot remove and re-apply the ZIO XT patch monitor.   Your ZIO patch monitor  will be sent USPS Priority mail from [facility] directly to your home address. The monitor may also be mailed to a PO BOX if home delivery is not available.   It may take 3-5 days to receive your monitor after you have been enrolled.   Once you have received you monitor, please review enclosed instructions.  Your monitor has already been registered assigning a specific monitor  serial # to you.   Applying the monitor   Shave hair from upper left chest.   Hold abrader disc by orange tab.  Rub abrader in 40 strokes over left upper chest as indicated in your monitor instructions.   Clean area with 4 enclosed alcohol pads .  Use all pads to assure are is cleaned thoroughly.  Let dry.   Apply patch as indicated in monitor instructions.  Patch will be place under collarbone on left side of chest with arrow pointing upward.   Rub patch adhesive wings for 2 minutes.Remove white label marked "1".  Remove white label marked "2".  Rub patch adhesive wings for 2 additional minutes.   While looking in a mirror, press and release button in center of patch.  A small green light will flash 3-4 times .  This will be your only indicator the monitor has been turned on.     Do not shower for the first 24 hours.  You may shower after the first 24 hours.   Press button if you feel a symptom. You will hear a small click.  Record Date, Time and Symptom in the Patient Log Book.   When you are ready to remove patch, follow instructions on last 2 pages of Patient Log Book.  Stick patch monitor onto last page of Patient Log Book.   Place Patient Log Book in [place] box.  Use locking tab on box and tape box closed securely.  The [employer] and [employer] has [employer] on it.  Please place in mailbox as soon as possible.  Your physician should have your test results approximately 7 days after the monitor has been mailed back to [facility]   Call [place] at [phone] if you have questions regarding your ZIO XT patch monitor.  Call them immediately if you see an orange light blinking on your monitor.   If your monitor falls off in less than 4 days contact our Monitor department at [phone].  If your monitor becomes loose or falls off after 4 days call [employer] at [phone] for suggestions on securing your monitor.          Follow-Up: At [facility], you and your health needs are our priority.  As part of our continuing mission to provide you with exceptional heart care, we have created designated Provider Care Teams.  These Care Teams include your primary Cardiologist (physician) and Advanced Practice Providers (APPs -  Physician Assistants and Nurse Practitioners) who all work together to provide you with the care you need, when you need it.  Your next appointment:   5 week(s)  The format for your next appointment:   In Person  Provider:   Quay Burow, MD  Other Instructions      Signed, Almyra Deforest, [place]  07/05/2019 12:43 AM    [place]

## 2019-07-02 NOTE — Telephone Encounter (Signed)
Enrolled patient for a 14 day Zio Telemetry monitor to be mailed to patients home.

## 2019-07-02 NOTE — Patient Outreach (Signed)
[facility]) Care Management  07/02/2019  [name] 05/20/1951 [id number]   Social work referral received from [facility] on 06/26/19.  Per referral, patient is needing assistance with living situation as she has no hot water, one burner on stove, no heat/uses space heaters.  CSW, [name], attempted to contact patient on 06/30/19 but had to leave voicemail message.  Unsuccessful outreach letter was also mailed. Second attempt to reach patient today. Went straight to voicemail; left message.  Will attempt to reach again on Monday, 07/05/19.  [name], BSW Social Worker [phone]

## 2019-07-02 NOTE — Patient Instructions (Signed)
Medication Instructions:   TAKE ISOSORBIDE MONONITRATE (IMDUR) 30 MG AS NEEDED IF SYSTOLIC (TOP NUMBER) BLOOD PRESSURE IS GREATER THAN 170 *If you need a refill on your cardiac medications before your next appointment, please call your pharmacy*  Lab Work: NONE ordered at this time of appointment   If you have labs (blood work) drawn today and your tests are completely normal, you will receive your results only by: Marland Kitchen MyChart Message (if you have MyChart) OR . A paper copy in the mail If you have any lab test that is abnormal or we need to change your treatment, we will call you to review the results.  Testing/Procedures:  This will be mailed to you, please expect 7-10 days to receive.  ZIO XT- Long Term Monitor Instructions   Your physician has requested you wear your ZIO patch monitor 14 days.   This is a single patch monitor.  Ir[employer] supplies one patch monitor per enrollment.  Additional stickers are not available.   Please do not apply patch if you will be having a Nuclear Stress Test, Echocardiogram, Cardiac CT, MRI, or Chest Xray during the time frame you would be wearing the monitor. The patch cannot be worn during these tests.  You cannot remove and re-apply the ZIO XT patch monitor.   Your ZIO patch monitor will be sent USPS Priority mail from [facility] directly to your home address. The monitor may also be mailed to a PO BOX if home delivery is not available.   It may take 3-5 days to receive your monitor after you have been enrolled.   Once you have received you monitor, please review enclosed instructions.  Your monitor has already been registered assigning a specific monitor serial # to you.   Applying the monitor   Shave hair from upper left chest.   Hold abrader disc by orange tab.  Rub abrader in 40 strokes over left upper chest as indicated in your monitor instructions.   Clean area with 4 enclosed alcohol pads .  Use all pads to assure are is cleaned  thoroughly.  Let dry.   Apply patch as indicated in monitor instructions.  Patch will be place under collarbone on left side of chest with arrow pointing upward.   Rub patch adhesive wings for 2 minutes.Remove white label marked "1".  Remove white label marked "2".  Rub patch adhesive wings for 2 additional minutes.   While looking in a mirror, press and release button in center of patch.  A small green light will flash 3-4 times .  This will be your only indicator the monitor has been turned on.     Do not shower for the first 24 hours.  You may shower after the first 24 hours.   Press button if you feel a symptom. You will hear a small click.  Record Date, Time and Symptom in the Patient Log Book.   When you are ready to remove patch, follow instructions on last 2 pages of Patient Log Book.  Stick patch monitor onto last page of Patient Log Book.   Place Patient Log Book in [place] box.  Use locking tab on box and tape box closed securely.  The [employer] has [employer] on it.  Please place in mailbox as soon as possible.  Your physician should have your test results approximately 7 days after the monitor has been mailed back to [facility].   Call [place] at [phone]  if you have questions regarding your ZIO XT patch monitor.  Call them immediately if you see an orange light blinking on your monitor.   If your monitor falls off in less than 4 days contact our Monitor department at [phone].  If your monitor becomes loose or falls off after 4 days call [employer] at [phone] for suggestions on securing your monitor.          Follow-Up: At [facility], you and your health needs are our priority.  As part of our continuing mission to provide you with exceptional heart care, we have created designated Provider Care Teams.  These Care Teams include your primary Cardiologist (physician) and Advanced Practice Providers (APPs -   Physician Assistants and Nurse Practitioners) who all work together to provide you with the care you need, when you need it.  Your next appointment:   5 week(s)  The format for your next appointment:   In Person  Provider:   Quay Burow, MD  Other Instructions

## 2019-07-03 DIAGNOSIS — D689 Coagulation defect, unspecified: Secondary | ICD-10-CM | POA: Diagnosis not present

## 2019-07-03 DIAGNOSIS — Z992 Dependence on renal dialysis: Secondary | ICD-10-CM | POA: Diagnosis not present

## 2019-07-03 DIAGNOSIS — Z283 Underimmunization status: Secondary | ICD-10-CM | POA: Diagnosis not present

## 2019-07-03 DIAGNOSIS — N2581 Secondary hyperparathyroidism of renal origin: Secondary | ICD-10-CM | POA: Diagnosis not present

## 2019-07-03 DIAGNOSIS — D631 Anemia in chronic kidney disease: Secondary | ICD-10-CM | POA: Diagnosis not present

## 2019-07-03 DIAGNOSIS — N186 End stage renal disease: Secondary | ICD-10-CM | POA: Diagnosis not present

## 2019-07-03 DIAGNOSIS — D509 Iron deficiency anemia, unspecified: Secondary | ICD-10-CM | POA: Diagnosis not present

## 2019-07-05 ENCOUNTER — Ambulatory Visit: Payer: Self-pay

## 2019-07-05 ENCOUNTER — Encounter: Payer: Self-pay | Admitting: Physician Assistant

## 2019-07-05 ENCOUNTER — Other Ambulatory Visit: Payer: Self-pay

## 2019-07-05 NOTE — Patient Outreach (Signed)
[facility]) Care Management  07/05/2019  [name] [birth year] [id number]   Social work referral received from [facility] on 06/26/19.  Per referral, patient is needing assistance with living situation as she has no hot water, one burner on stove, no heat/uses space heaters. Voicemail messages left for patient on 06/30/19 and 07/02/19.   Successful outreach to patient today.  Introduced self and reason for call.  Patient confirmed multiple issues with living situation; stated that lack of hot water and mouse problem is biggest concern.  Patient stated that wooded area near her home was recently cut down which lead to mouse issue.  Patient is on fixed income and does not have funds to resolve these problems.   Per patient, she has been without heat in her home for 8-10 years and has used space heaters this entire time. Patient stated "I have used space heaters for years and I'm doing fine with it." Per patient, the heat pump has a hole in it so gas leaks into the home if connected.   Patient said that friends and family members have suggested that she move into an apartment so that she would no longer have these issues.  Patient stated "I'm not going to give up my life, my pets, my house, and my privacy."  Talked with patient about community agencies(specifically [employer], [place], and [place]) that MAY be able to assist with water heater, heat pump, and pest problem.  Informed patient that [facility] and Independent Living will require that she complete application/paperwork for assistance.   Patient was provided with contact information for [employer] by social worker at [facility] center on 07/02/19 but has not yet called to submit referral.  Informed patient that I will submit referral to [facility] today but she was encouraged to call as well.  Referral will be submitted to Independent Living.   Will call [employer] and request that application be mailed to patient. Will follow up with patient when responses are received regarding referrals.  [name], BSW Social Worker [phone]

## 2019-07-06 DIAGNOSIS — D689 Coagulation defect, unspecified: Secondary | ICD-10-CM | POA: Diagnosis not present

## 2019-07-06 DIAGNOSIS — D631 Anemia in chronic kidney disease: Secondary | ICD-10-CM | POA: Diagnosis not present

## 2019-07-06 DIAGNOSIS — Z283 Underimmunization status: Secondary | ICD-10-CM | POA: Diagnosis not present

## 2019-07-06 DIAGNOSIS — D509 Iron deficiency anemia, unspecified: Secondary | ICD-10-CM | POA: Diagnosis not present

## 2019-07-06 DIAGNOSIS — N186 End stage renal disease: Secondary | ICD-10-CM | POA: Diagnosis not present

## 2019-07-06 DIAGNOSIS — N2581 Secondary hyperparathyroidism of renal origin: Secondary | ICD-10-CM | POA: Diagnosis not present

## 2019-07-06 DIAGNOSIS — Z992 Dependence on renal dialysis: Secondary | ICD-10-CM | POA: Diagnosis not present

## 2019-07-08 DIAGNOSIS — Z992 Dependence on renal dialysis: Secondary | ICD-10-CM | POA: Diagnosis not present

## 2019-07-08 DIAGNOSIS — N2581 Secondary hyperparathyroidism of renal origin: Secondary | ICD-10-CM | POA: Diagnosis not present

## 2019-07-08 DIAGNOSIS — N186 End stage renal disease: Secondary | ICD-10-CM | POA: Diagnosis not present

## 2019-07-08 DIAGNOSIS — D631 Anemia in chronic kidney disease: Secondary | ICD-10-CM | POA: Diagnosis not present

## 2019-07-08 DIAGNOSIS — D689 Coagulation defect, unspecified: Secondary | ICD-10-CM | POA: Diagnosis not present

## 2019-07-08 DIAGNOSIS — D509 Iron deficiency anemia, unspecified: Secondary | ICD-10-CM | POA: Diagnosis not present

## 2019-07-08 DIAGNOSIS — Z283 Underimmunization status: Secondary | ICD-10-CM | POA: Diagnosis not present

## 2019-07-09 ENCOUNTER — Other Ambulatory Visit: Payer: Self-pay

## 2019-07-09 NOTE — Patient Outreach (Signed)
[facility]) Care Management  07/09/2019  MACKENZE GRANDISON 06/05/1951 [id number]   Talked with Judeth Cornfield from [employer] on 07/05/19 regarding referral submitted.  Referral was received and is being reviewed to determine if agency can provide assistance.  Assessments for new referrals are not taking place until 2021.  Caseworker with [facility], Tor Netters, is collaborating with supervisor to determine if agency can assist with any of patient's needs. Was able to connect with representative at [facility] and they will mail application to patient. Called patient today and provided above update.  Encouraged her to complete PTRC application as [facility] will likely not be able to assist with every issue in the home. Will follow up with patient within the next two weeks to ensure receipt of application from [facility].  [name], BSW Social Worker [phone]

## 2019-07-10 DIAGNOSIS — D689 Coagulation defect, unspecified: Secondary | ICD-10-CM | POA: Diagnosis not present

## 2019-07-10 DIAGNOSIS — Z283 Underimmunization status: Secondary | ICD-10-CM | POA: Diagnosis not present

## 2019-07-10 DIAGNOSIS — N186 End stage renal disease: Secondary | ICD-10-CM | POA: Diagnosis not present

## 2019-07-10 DIAGNOSIS — N2581 Secondary hyperparathyroidism of renal origin: Secondary | ICD-10-CM | POA: Diagnosis not present

## 2019-07-10 DIAGNOSIS — Z992 Dependence on renal dialysis: Secondary | ICD-10-CM | POA: Diagnosis not present

## 2019-07-10 DIAGNOSIS — D631 Anemia in chronic kidney disease: Secondary | ICD-10-CM | POA: Diagnosis not present

## 2019-07-10 DIAGNOSIS — D509 Iron deficiency anemia, unspecified: Secondary | ICD-10-CM | POA: Diagnosis not present

## 2019-07-13 ENCOUNTER — Other Ambulatory Visit: Payer: Self-pay | Admitting: Family Medicine

## 2019-07-13 DIAGNOSIS — D509 Iron deficiency anemia, unspecified: Secondary | ICD-10-CM | POA: Diagnosis not present

## 2019-07-13 DIAGNOSIS — Z283 Underimmunization status: Secondary | ICD-10-CM | POA: Diagnosis not present

## 2019-07-13 DIAGNOSIS — D631 Anemia in chronic kidney disease: Secondary | ICD-10-CM | POA: Diagnosis not present

## 2019-07-13 DIAGNOSIS — N186 End stage renal disease: Secondary | ICD-10-CM | POA: Diagnosis not present

## 2019-07-13 DIAGNOSIS — Z992 Dependence on renal dialysis: Secondary | ICD-10-CM | POA: Diagnosis not present

## 2019-07-13 DIAGNOSIS — D689 Coagulation defect, unspecified: Secondary | ICD-10-CM | POA: Diagnosis not present

## 2019-07-13 DIAGNOSIS — N2581 Secondary hyperparathyroidism of renal origin: Secondary | ICD-10-CM | POA: Diagnosis not present

## 2019-07-15 DIAGNOSIS — D509 Iron deficiency anemia, unspecified: Secondary | ICD-10-CM | POA: Diagnosis not present

## 2019-07-15 DIAGNOSIS — N2581 Secondary hyperparathyroidism of renal origin: Secondary | ICD-10-CM | POA: Diagnosis not present

## 2019-07-15 DIAGNOSIS — N186 End stage renal disease: Secondary | ICD-10-CM | POA: Diagnosis not present

## 2019-07-15 DIAGNOSIS — D689 Coagulation defect, unspecified: Secondary | ICD-10-CM | POA: Diagnosis not present

## 2019-07-15 DIAGNOSIS — Z283 Underimmunization status: Secondary | ICD-10-CM | POA: Diagnosis not present

## 2019-07-15 DIAGNOSIS — D631 Anemia in chronic kidney disease: Secondary | ICD-10-CM | POA: Diagnosis not present

## 2019-07-15 DIAGNOSIS — Z992 Dependence on renal dialysis: Secondary | ICD-10-CM | POA: Diagnosis not present

## 2019-07-18 DIAGNOSIS — Z283 Underimmunization status: Secondary | ICD-10-CM | POA: Diagnosis not present

## 2019-07-18 DIAGNOSIS — N186 End stage renal disease: Secondary | ICD-10-CM | POA: Diagnosis not present

## 2019-07-18 DIAGNOSIS — D689 Coagulation defect, unspecified: Secondary | ICD-10-CM | POA: Diagnosis not present

## 2019-07-18 DIAGNOSIS — D509 Iron deficiency anemia, unspecified: Secondary | ICD-10-CM | POA: Diagnosis not present

## 2019-07-18 DIAGNOSIS — N2581 Secondary hyperparathyroidism of renal origin: Secondary | ICD-10-CM | POA: Diagnosis not present

## 2019-07-18 DIAGNOSIS — D631 Anemia in chronic kidney disease: Secondary | ICD-10-CM | POA: Diagnosis not present

## 2019-07-18 DIAGNOSIS — Z992 Dependence on renal dialysis: Secondary | ICD-10-CM | POA: Diagnosis not present

## 2019-07-20 DIAGNOSIS — Z992 Dependence on renal dialysis: Secondary | ICD-10-CM | POA: Diagnosis not present

## 2019-07-20 DIAGNOSIS — N186 End stage renal disease: Secondary | ICD-10-CM | POA: Diagnosis not present

## 2019-07-20 DIAGNOSIS — N2581 Secondary hyperparathyroidism of renal origin: Secondary | ICD-10-CM | POA: Diagnosis not present

## 2019-07-20 DIAGNOSIS — D509 Iron deficiency anemia, unspecified: Secondary | ICD-10-CM | POA: Diagnosis not present

## 2019-07-20 DIAGNOSIS — D689 Coagulation defect, unspecified: Secondary | ICD-10-CM | POA: Diagnosis not present

## 2019-07-20 DIAGNOSIS — D631 Anemia in chronic kidney disease: Secondary | ICD-10-CM | POA: Diagnosis not present

## 2019-07-20 DIAGNOSIS — Z283 Underimmunization status: Secondary | ICD-10-CM | POA: Diagnosis not present

## 2019-07-21 ENCOUNTER — Other Ambulatory Visit: Payer: Self-pay

## 2019-07-21 ENCOUNTER — Ambulatory Visit: Payer: Self-pay

## 2019-07-21 NOTE — Patient Outreach (Signed)
[facility]) Care Management  07/21/2019  [name] 09/22/1950 [id number]   Received the following communication from [facility] regarding referral to [place] for home repairs.  "Unfortunately if there is not a specific modification need then there is nothing we can do.  PTRC Weatherization Program (http://dawson-may.com/, 445-861-8254) and [place] (FormerBoss.no, (863)748-9270 may be helpful resources for getting home repairs such as a heat pump." Patient has already been in formed of [facility] Program and should receive an application in the mail. She has also been referred to [employer] but assessment are not occurring until the new year. Attempted to follow up with patient today but she did not answer and voicemail box was full.  Will attempt to reach again within four business days.  [name], BSW Social Worker [phone]

## 2019-07-22 DIAGNOSIS — N186 End stage renal disease: Secondary | ICD-10-CM | POA: Diagnosis not present

## 2019-07-22 DIAGNOSIS — D631 Anemia in chronic kidney disease: Secondary | ICD-10-CM | POA: Diagnosis not present

## 2019-07-22 DIAGNOSIS — N2581 Secondary hyperparathyroidism of renal origin: Secondary | ICD-10-CM | POA: Diagnosis not present

## 2019-07-22 DIAGNOSIS — I129 Hypertensive chronic kidney disease with stage 1 through stage 4 chronic kidney disease, or unspecified chronic kidney disease: Secondary | ICD-10-CM | POA: Diagnosis not present

## 2019-07-22 DIAGNOSIS — D509 Iron deficiency anemia, unspecified: Secondary | ICD-10-CM | POA: Diagnosis not present

## 2019-07-22 DIAGNOSIS — Z283 Underimmunization status: Secondary | ICD-10-CM | POA: Diagnosis not present

## 2019-07-22 DIAGNOSIS — Z992 Dependence on renal dialysis: Secondary | ICD-10-CM | POA: Diagnosis not present

## 2019-07-22 DIAGNOSIS — D689 Coagulation defect, unspecified: Secondary | ICD-10-CM | POA: Diagnosis not present

## 2019-07-25 DIAGNOSIS — D631 Anemia in chronic kidney disease: Secondary | ICD-10-CM | POA: Diagnosis not present

## 2019-07-25 DIAGNOSIS — N186 End stage renal disease: Secondary | ICD-10-CM | POA: Diagnosis not present

## 2019-07-25 DIAGNOSIS — E1129 Type 2 diabetes mellitus with other diabetic kidney complication: Secondary | ICD-10-CM | POA: Diagnosis not present

## 2019-07-25 DIAGNOSIS — Z992 Dependence on renal dialysis: Secondary | ICD-10-CM | POA: Diagnosis not present

## 2019-07-25 DIAGNOSIS — D509 Iron deficiency anemia, unspecified: Secondary | ICD-10-CM | POA: Diagnosis not present

## 2019-07-25 DIAGNOSIS — N2581 Secondary hyperparathyroidism of renal origin: Secondary | ICD-10-CM | POA: Diagnosis not present

## 2019-07-25 DIAGNOSIS — D689 Coagulation defect, unspecified: Secondary | ICD-10-CM | POA: Diagnosis not present

## 2019-07-25 DIAGNOSIS — Z283 Underimmunization status: Secondary | ICD-10-CM | POA: Diagnosis not present

## 2019-07-27 ENCOUNTER — Other Ambulatory Visit: Payer: Self-pay

## 2019-07-27 DIAGNOSIS — N186 End stage renal disease: Secondary | ICD-10-CM | POA: Diagnosis not present

## 2019-07-27 DIAGNOSIS — D509 Iron deficiency anemia, unspecified: Secondary | ICD-10-CM | POA: Diagnosis not present

## 2019-07-27 DIAGNOSIS — E1129 Type 2 diabetes mellitus with other diabetic kidney complication: Secondary | ICD-10-CM | POA: Diagnosis not present

## 2019-07-27 DIAGNOSIS — N2581 Secondary hyperparathyroidism of renal origin: Secondary | ICD-10-CM | POA: Diagnosis not present

## 2019-07-27 DIAGNOSIS — D631 Anemia in chronic kidney disease: Secondary | ICD-10-CM | POA: Diagnosis not present

## 2019-07-27 DIAGNOSIS — Z283 Underimmunization status: Secondary | ICD-10-CM | POA: Diagnosis not present

## 2019-07-27 DIAGNOSIS — D689 Coagulation defect, unspecified: Secondary | ICD-10-CM | POA: Diagnosis not present

## 2019-07-27 DIAGNOSIS — Z992 Dependence on renal dialysis: Secondary | ICD-10-CM | POA: Diagnosis not present

## 2019-07-27 NOTE — Patient Outreach (Signed)
[facility]) Care Management  07/27/2019  [name] 08-23-1950 [id number]   Received the following communication on 07/21/19 from [facility] regarding referral to [place] for home repairs.  "Unfortunately if there is not a specific modification need then there is nothing we can do. PTRC Weatherization Program (http://dawson-may.com/, 803-208-3019) and [place] (FormerBoss.no, 530-556-2659 may be helpful resources for getting home repairs such as a heat pump."  Patient has already been in formed of [facility] Program and should receive an application in the mail.  She has also been referred to [employer] but assessment are not occurring until the new year.  Attempted to follow up with patient on 07/21/19 but she did not answer and voicemail box was full.  Successful follow up call to patient today. Informed her that, unfortunately, [facility], is unable to assist with needed repairs.  She did receive the application from [facility] but stated that she has not started working on it due to the holidays.  Provided her with contact information for [place] and encouraged her to call to inquire about possible assistance.    Will follow up with [employer] within the next two months.  Will follow up with patient again regarding other resources provided.    [name], BSW Social Worker [phone]

## 2019-07-29 DIAGNOSIS — Z992 Dependence on renal dialysis: Secondary | ICD-10-CM | POA: Diagnosis not present

## 2019-07-29 DIAGNOSIS — N2581 Secondary hyperparathyroidism of renal origin: Secondary | ICD-10-CM | POA: Diagnosis not present

## 2019-07-29 DIAGNOSIS — D631 Anemia in chronic kidney disease: Secondary | ICD-10-CM | POA: Diagnosis not present

## 2019-07-29 DIAGNOSIS — E1129 Type 2 diabetes mellitus with other diabetic kidney complication: Secondary | ICD-10-CM | POA: Diagnosis not present

## 2019-07-29 DIAGNOSIS — D509 Iron deficiency anemia, unspecified: Secondary | ICD-10-CM | POA: Diagnosis not present

## 2019-07-29 DIAGNOSIS — D689 Coagulation defect, unspecified: Secondary | ICD-10-CM | POA: Diagnosis not present

## 2019-07-29 DIAGNOSIS — Z283 Underimmunization status: Secondary | ICD-10-CM | POA: Diagnosis not present

## 2019-07-29 DIAGNOSIS — N186 End stage renal disease: Secondary | ICD-10-CM | POA: Diagnosis not present

## 2019-07-31 DIAGNOSIS — E1129 Type 2 diabetes mellitus with other diabetic kidney complication: Secondary | ICD-10-CM | POA: Diagnosis not present

## 2019-07-31 DIAGNOSIS — Z283 Underimmunization status: Secondary | ICD-10-CM | POA: Diagnosis not present

## 2019-07-31 DIAGNOSIS — D509 Iron deficiency anemia, unspecified: Secondary | ICD-10-CM | POA: Diagnosis not present

## 2019-07-31 DIAGNOSIS — D631 Anemia in chronic kidney disease: Secondary | ICD-10-CM | POA: Diagnosis not present

## 2019-07-31 DIAGNOSIS — Z992 Dependence on renal dialysis: Secondary | ICD-10-CM | POA: Diagnosis not present

## 2019-07-31 DIAGNOSIS — N186 End stage renal disease: Secondary | ICD-10-CM | POA: Diagnosis not present

## 2019-07-31 DIAGNOSIS — D689 Coagulation defect, unspecified: Secondary | ICD-10-CM | POA: Diagnosis not present

## 2019-07-31 DIAGNOSIS — N2581 Secondary hyperparathyroidism of renal origin: Secondary | ICD-10-CM | POA: Diagnosis not present

## 2019-08-03 DIAGNOSIS — N2581 Secondary hyperparathyroidism of renal origin: Secondary | ICD-10-CM | POA: Diagnosis not present

## 2019-08-03 DIAGNOSIS — Z992 Dependence on renal dialysis: Secondary | ICD-10-CM | POA: Diagnosis not present

## 2019-08-03 DIAGNOSIS — Z283 Underimmunization status: Secondary | ICD-10-CM | POA: Diagnosis not present

## 2019-08-03 DIAGNOSIS — D509 Iron deficiency anemia, unspecified: Secondary | ICD-10-CM | POA: Diagnosis not present

## 2019-08-03 DIAGNOSIS — E1129 Type 2 diabetes mellitus with other diabetic kidney complication: Secondary | ICD-10-CM | POA: Diagnosis not present

## 2019-08-03 DIAGNOSIS — D631 Anemia in chronic kidney disease: Secondary | ICD-10-CM | POA: Diagnosis not present

## 2019-08-03 DIAGNOSIS — N186 End stage renal disease: Secondary | ICD-10-CM | POA: Diagnosis not present

## 2019-08-03 DIAGNOSIS — D689 Coagulation defect, unspecified: Secondary | ICD-10-CM | POA: Diagnosis not present

## 2019-08-05 DIAGNOSIS — N2581 Secondary hyperparathyroidism of renal origin: Secondary | ICD-10-CM | POA: Diagnosis not present

## 2019-08-05 DIAGNOSIS — Z283 Underimmunization status: Secondary | ICD-10-CM | POA: Diagnosis not present

## 2019-08-05 DIAGNOSIS — D509 Iron deficiency anemia, unspecified: Secondary | ICD-10-CM | POA: Diagnosis not present

## 2019-08-05 DIAGNOSIS — N186 End stage renal disease: Secondary | ICD-10-CM | POA: Diagnosis not present

## 2019-08-05 DIAGNOSIS — D689 Coagulation defect, unspecified: Secondary | ICD-10-CM | POA: Diagnosis not present

## 2019-08-05 DIAGNOSIS — Z992 Dependence on renal dialysis: Secondary | ICD-10-CM | POA: Diagnosis not present

## 2019-08-05 DIAGNOSIS — E1129 Type 2 diabetes mellitus with other diabetic kidney complication: Secondary | ICD-10-CM | POA: Diagnosis not present

## 2019-08-05 DIAGNOSIS — D631 Anemia in chronic kidney disease: Secondary | ICD-10-CM | POA: Diagnosis not present

## 2019-08-07 DIAGNOSIS — N186 End stage renal disease: Secondary | ICD-10-CM | POA: Diagnosis not present

## 2019-08-07 DIAGNOSIS — N2581 Secondary hyperparathyroidism of renal origin: Secondary | ICD-10-CM | POA: Diagnosis not present

## 2019-08-07 DIAGNOSIS — D631 Anemia in chronic kidney disease: Secondary | ICD-10-CM | POA: Diagnosis not present

## 2019-08-07 DIAGNOSIS — E1129 Type 2 diabetes mellitus with other diabetic kidney complication: Secondary | ICD-10-CM | POA: Diagnosis not present

## 2019-08-07 DIAGNOSIS — Z283 Underimmunization status: Secondary | ICD-10-CM | POA: Diagnosis not present

## 2019-08-07 DIAGNOSIS — Z992 Dependence on renal dialysis: Secondary | ICD-10-CM | POA: Diagnosis not present

## 2019-08-07 DIAGNOSIS — D689 Coagulation defect, unspecified: Secondary | ICD-10-CM | POA: Diagnosis not present

## 2019-08-07 DIAGNOSIS — D509 Iron deficiency anemia, unspecified: Secondary | ICD-10-CM | POA: Diagnosis not present

## 2019-08-09 ENCOUNTER — Encounter (HOSPITAL_COMMUNITY): Payer: Medicare Other

## 2019-08-09 ENCOUNTER — Ambulatory Visit: Payer: Medicare Other | Admitting: Surgery

## 2019-08-10 DIAGNOSIS — Z283 Underimmunization status: Secondary | ICD-10-CM | POA: Diagnosis not present

## 2019-08-10 DIAGNOSIS — N186 End stage renal disease: Secondary | ICD-10-CM | POA: Diagnosis not present

## 2019-08-10 DIAGNOSIS — N2581 Secondary hyperparathyroidism of renal origin: Secondary | ICD-10-CM | POA: Diagnosis not present

## 2019-08-10 DIAGNOSIS — D631 Anemia in chronic kidney disease: Secondary | ICD-10-CM | POA: Diagnosis not present

## 2019-08-10 DIAGNOSIS — E1129 Type 2 diabetes mellitus with other diabetic kidney complication: Secondary | ICD-10-CM | POA: Diagnosis not present

## 2019-08-10 DIAGNOSIS — D689 Coagulation defect, unspecified: Secondary | ICD-10-CM | POA: Diagnosis not present

## 2019-08-10 DIAGNOSIS — Z992 Dependence on renal dialysis: Secondary | ICD-10-CM | POA: Diagnosis not present

## 2019-08-10 DIAGNOSIS — D509 Iron deficiency anemia, unspecified: Secondary | ICD-10-CM | POA: Diagnosis not present

## 2019-08-11 ENCOUNTER — Telehealth: Payer: Self-pay | Admitting: Cardiovascular Disease

## 2019-08-11 NOTE — Telephone Encounter (Signed)
New Message:    Please call, pt have questions, says she do not know how to put it on.

## 2019-08-12 DIAGNOSIS — N2581 Secondary hyperparathyroidism of renal origin: Secondary | ICD-10-CM | POA: Diagnosis not present

## 2019-08-12 DIAGNOSIS — D509 Iron deficiency anemia, unspecified: Secondary | ICD-10-CM | POA: Diagnosis not present

## 2019-08-12 DIAGNOSIS — N186 End stage renal disease: Secondary | ICD-10-CM | POA: Diagnosis not present

## 2019-08-12 DIAGNOSIS — D631 Anemia in chronic kidney disease: Secondary | ICD-10-CM | POA: Diagnosis not present

## 2019-08-12 DIAGNOSIS — Z283 Underimmunization status: Secondary | ICD-10-CM | POA: Diagnosis not present

## 2019-08-12 DIAGNOSIS — Z992 Dependence on renal dialysis: Secondary | ICD-10-CM | POA: Diagnosis not present

## 2019-08-12 DIAGNOSIS — E1129 Type 2 diabetes mellitus with other diabetic kidney complication: Secondary | ICD-10-CM | POA: Diagnosis not present

## 2019-08-12 DIAGNOSIS — D689 Coagulation defect, unspecified: Secondary | ICD-10-CM | POA: Diagnosis not present

## 2019-08-12 NOTE — Telephone Encounter (Signed)
Reviewed instructions on how to apply ZIO AT long term monitor-Live Telemetry.

## 2019-08-13 ENCOUNTER — Ambulatory Visit: Payer: Medicare Other | Admitting: Cardiovascular Disease

## 2019-08-14 DIAGNOSIS — D631 Anemia in chronic kidney disease: Secondary | ICD-10-CM | POA: Diagnosis not present

## 2019-08-14 DIAGNOSIS — Z283 Underimmunization status: Secondary | ICD-10-CM | POA: Diagnosis not present

## 2019-08-14 DIAGNOSIS — N186 End stage renal disease: Secondary | ICD-10-CM | POA: Diagnosis not present

## 2019-08-14 DIAGNOSIS — N2581 Secondary hyperparathyroidism of renal origin: Secondary | ICD-10-CM | POA: Diagnosis not present

## 2019-08-14 DIAGNOSIS — D509 Iron deficiency anemia, unspecified: Secondary | ICD-10-CM | POA: Diagnosis not present

## 2019-08-14 DIAGNOSIS — D689 Coagulation defect, unspecified: Secondary | ICD-10-CM | POA: Diagnosis not present

## 2019-08-14 DIAGNOSIS — E1129 Type 2 diabetes mellitus with other diabetic kidney complication: Secondary | ICD-10-CM | POA: Diagnosis not present

## 2019-08-14 DIAGNOSIS — Z992 Dependence on renal dialysis: Secondary | ICD-10-CM | POA: Diagnosis not present

## 2019-08-16 DIAGNOSIS — I871 Compression of vein: Secondary | ICD-10-CM | POA: Diagnosis not present

## 2019-08-16 DIAGNOSIS — Z992 Dependence on renal dialysis: Secondary | ICD-10-CM | POA: Diagnosis not present

## 2019-08-16 DIAGNOSIS — T82858A Stenosis of vascular prosthetic devices, implants and grafts, initial encounter: Secondary | ICD-10-CM | POA: Diagnosis not present

## 2019-08-16 DIAGNOSIS — N186 End stage renal disease: Secondary | ICD-10-CM | POA: Diagnosis not present

## 2019-08-17 DIAGNOSIS — E1129 Type 2 diabetes mellitus with other diabetic kidney complication: Secondary | ICD-10-CM | POA: Diagnosis not present

## 2019-08-17 DIAGNOSIS — D509 Iron deficiency anemia, unspecified: Secondary | ICD-10-CM | POA: Diagnosis not present

## 2019-08-17 DIAGNOSIS — Z283 Underimmunization status: Secondary | ICD-10-CM | POA: Diagnosis not present

## 2019-08-17 DIAGNOSIS — D689 Coagulation defect, unspecified: Secondary | ICD-10-CM | POA: Diagnosis not present

## 2019-08-17 DIAGNOSIS — Z992 Dependence on renal dialysis: Secondary | ICD-10-CM | POA: Diagnosis not present

## 2019-08-17 DIAGNOSIS — N186 End stage renal disease: Secondary | ICD-10-CM | POA: Diagnosis not present

## 2019-08-17 DIAGNOSIS — N2581 Secondary hyperparathyroidism of renal origin: Secondary | ICD-10-CM | POA: Diagnosis not present

## 2019-08-17 DIAGNOSIS — D631 Anemia in chronic kidney disease: Secondary | ICD-10-CM | POA: Diagnosis not present

## 2019-08-19 DIAGNOSIS — D689 Coagulation defect, unspecified: Secondary | ICD-10-CM | POA: Diagnosis not present

## 2019-08-19 DIAGNOSIS — D509 Iron deficiency anemia, unspecified: Secondary | ICD-10-CM | POA: Diagnosis not present

## 2019-08-19 DIAGNOSIS — N2581 Secondary hyperparathyroidism of renal origin: Secondary | ICD-10-CM | POA: Diagnosis not present

## 2019-08-19 DIAGNOSIS — Z992 Dependence on renal dialysis: Secondary | ICD-10-CM | POA: Diagnosis not present

## 2019-08-19 DIAGNOSIS — E1129 Type 2 diabetes mellitus with other diabetic kidney complication: Secondary | ICD-10-CM | POA: Diagnosis not present

## 2019-08-19 DIAGNOSIS — N186 End stage renal disease: Secondary | ICD-10-CM | POA: Diagnosis not present

## 2019-08-19 DIAGNOSIS — D631 Anemia in chronic kidney disease: Secondary | ICD-10-CM | POA: Diagnosis not present

## 2019-08-19 DIAGNOSIS — Z283 Underimmunization status: Secondary | ICD-10-CM | POA: Diagnosis not present

## 2019-08-21 DIAGNOSIS — D509 Iron deficiency anemia, unspecified: Secondary | ICD-10-CM | POA: Diagnosis not present

## 2019-08-21 DIAGNOSIS — N186 End stage renal disease: Secondary | ICD-10-CM | POA: Diagnosis not present

## 2019-08-21 DIAGNOSIS — D689 Coagulation defect, unspecified: Secondary | ICD-10-CM | POA: Diagnosis not present

## 2019-08-21 DIAGNOSIS — N2581 Secondary hyperparathyroidism of renal origin: Secondary | ICD-10-CM | POA: Diagnosis not present

## 2019-08-21 DIAGNOSIS — D631 Anemia in chronic kidney disease: Secondary | ICD-10-CM | POA: Diagnosis not present

## 2019-08-21 DIAGNOSIS — Z992 Dependence on renal dialysis: Secondary | ICD-10-CM | POA: Diagnosis not present

## 2019-08-21 DIAGNOSIS — E1129 Type 2 diabetes mellitus with other diabetic kidney complication: Secondary | ICD-10-CM | POA: Diagnosis not present

## 2019-08-21 DIAGNOSIS — Z283 Underimmunization status: Secondary | ICD-10-CM | POA: Diagnosis not present

## 2019-08-22 DIAGNOSIS — N186 End stage renal disease: Secondary | ICD-10-CM | POA: Diagnosis not present

## 2019-08-22 DIAGNOSIS — Z992 Dependence on renal dialysis: Secondary | ICD-10-CM | POA: Diagnosis not present

## 2019-08-22 DIAGNOSIS — I129 Hypertensive chronic kidney disease with stage 1 through stage 4 chronic kidney disease, or unspecified chronic kidney disease: Secondary | ICD-10-CM | POA: Diagnosis not present

## 2019-08-24 DIAGNOSIS — D509 Iron deficiency anemia, unspecified: Secondary | ICD-10-CM | POA: Diagnosis not present

## 2019-08-24 DIAGNOSIS — E1129 Type 2 diabetes mellitus with other diabetic kidney complication: Secondary | ICD-10-CM | POA: Diagnosis not present

## 2019-08-24 DIAGNOSIS — D689 Coagulation defect, unspecified: Secondary | ICD-10-CM | POA: Diagnosis not present

## 2019-08-24 DIAGNOSIS — N2581 Secondary hyperparathyroidism of renal origin: Secondary | ICD-10-CM | POA: Diagnosis not present

## 2019-08-24 DIAGNOSIS — D631 Anemia in chronic kidney disease: Secondary | ICD-10-CM | POA: Diagnosis not present

## 2019-08-24 DIAGNOSIS — Z283 Underimmunization status: Secondary | ICD-10-CM | POA: Diagnosis not present

## 2019-08-24 DIAGNOSIS — Z992 Dependence on renal dialysis: Secondary | ICD-10-CM | POA: Diagnosis not present

## 2019-08-24 DIAGNOSIS — N186 End stage renal disease: Secondary | ICD-10-CM | POA: Diagnosis not present

## 2019-08-26 DIAGNOSIS — D631 Anemia in chronic kidney disease: Secondary | ICD-10-CM | POA: Diagnosis not present

## 2019-08-26 DIAGNOSIS — Z992 Dependence on renal dialysis: Secondary | ICD-10-CM | POA: Diagnosis not present

## 2019-08-26 DIAGNOSIS — D509 Iron deficiency anemia, unspecified: Secondary | ICD-10-CM | POA: Diagnosis not present

## 2019-08-26 DIAGNOSIS — E1129 Type 2 diabetes mellitus with other diabetic kidney complication: Secondary | ICD-10-CM | POA: Diagnosis not present

## 2019-08-26 DIAGNOSIS — N186 End stage renal disease: Secondary | ICD-10-CM | POA: Diagnosis not present

## 2019-08-26 DIAGNOSIS — N2581 Secondary hyperparathyroidism of renal origin: Secondary | ICD-10-CM | POA: Diagnosis not present

## 2019-08-26 DIAGNOSIS — D689 Coagulation defect, unspecified: Secondary | ICD-10-CM | POA: Diagnosis not present

## 2019-08-26 DIAGNOSIS — Z283 Underimmunization status: Secondary | ICD-10-CM | POA: Diagnosis not present

## 2019-08-27 NOTE — Progress Notes (Signed)
[facility] Note  09/03/2019     CHIEF COMPLAINT Patient presents for Retina Follow Up   HISTORY OF PRESENT ILLNESS: [name] is a 69 y.o. female who presents to the clinic today for:   HPI    Retina Follow Up    Patient presents with  Diabetic Retinopathy.  In both eyes.  This started weeks ago.  Severity is moderate.  Duration of weeks.  Since onset it is stable.  I, the attending physician,  performed the HPI with the patient and updated documentation appropriately.          Comments    Pt states vision OS is the same but thinks vision OD is worse due to "stink bug" hitting her in right eye about a month ago.  Patient denies eye pain or discomfort and denies any new or worsening floaters or fol OU.       Last edited by [name], MD on 09/04/2019 12:14 AM. (History)    pt states she just started dialysis, pt states about a month ago she got hit in the eye by a stink bug at her sisters house and she feels like there is a "blur spot" in her vision now  Referring physician: [name], MD [address] [place],  [place] [address]  HISTORICAL INFORMATION:   Selected notes from the MEDICAL RECORD NUMBER Referred by Dr. [name] for concern of BRVO OS LEE: 10.03.19 (C. [facility]) [BCVA: OD: 20/30 OS: 20/50] Ocular Hx-cataracts OU, HTR OU, NPDR OU, DES PMH-DM (J1O: 7.3, takes Trulicity and [place]), HTN, HLD     CURRENT MEDICATIONS: No current outpatient medications on file. (Ophthalmic Drugs)   No current facility-administered medications for this visit. (Ophthalmic Drugs)   Current Outpatient Medications (Other)  Medication Sig  . AURYXIA 1 GM 210 MG(Fe) tablet Take 210 mg by mouth 3 (three) times daily.  . Blood Glucose Monitoring Suppl (ONE TOUCH ULTRA 2) w/Device KIT Use to check BS BID-QID Dx:E11.9  . BREO ELLIPTA 100-25 MCG/INH AEPB Inhale 1 puff by mouth once daily  . gabapentin (NEURONTIN) 300 MG capsule TAKE 1  CAPSULE BY MOUTH THREE TIMES DAILY  . Insulin Pen Needle (LIVE BETTER PEN NEEDLES) 31G X 6 MM MISC To use with Tresiba pens daily  . isosorbide mononitrate (IMDUR) 30 MG 24 hr tablet Take 30 mg by mouth daily. TAKE 1 TABLET AS NEEDED IF SBP IS GREATER THAN 170  . Lancets (ONETOUCH ULTRASOFT) lancets Use as instructed  . ONETOUCH ULTRA test strip USE AS DIRECTED TO MONITOR  FSBS 3 TIMES DAILY  . PROAIR HFA 108 (90 Base) MCG/ACT inhaler Inhale 2 puffs into the lungs every 6 (six) hours as needed for wheezing or shortness of breath.  . rosuvastatin (CRESTOR) 20 MG tablet Take 1 tablet by mouth once daily  . TRESIBA FLEXTOUCH 100 UNIT/ML SOPN FlexTouch Pen INJECT 150 UNITS INTO THE SKIN ONCE DAILY   Current Facility-Administered Medications (Other)  Medication Route  . Bevacizumab (AVASTIN) SOLN 1.25 mg Intravitreal  . Bevacizumab (AVASTIN) SOLN 1.25 mg Intravitreal  . Bevacizumab (AVASTIN) SOLN 1.25 mg Intravitreal  . Bevacizumab (AVASTIN) SOLN 1.25 mg Intravitreal  . Bevacizumab (AVASTIN) SOLN 1.25 mg Intravitreal      REVIEW OF SYSTEMS: ROS    Positive for: Genitourinary, Endocrine, Cardiovascular, Eyes   Negative for: Constitutional, Gastrointestinal, Neurological, Skin, Musculoskeletal, HENT, Respiratory, Psychiatric, Allergic/Imm, Heme/Lymph   Last edited by Doneen Poisson on  09/03/2019 10:04 AM. (History)       ALLERGIES Allergies  Allergen Reactions  . Penicillins Other (See Comments)    UNSPECIFIED CHILDHOOD REACTION  Has patient had a PCN reaction causing immediate rash, facial/tongue/throat swelling, SOB or lightheadedness with hypotension: Unknown Has patient had a PCN reaction causing severe rash involving mucus membranes or skin necrosis: Unknown Has patient had a PCN reaction that required hospitalization: Unknown Has patient had a PCN reaction occurring within the last 10 years: Unknown If all of the above answers are "NO", then may proceed with Cephalosporin  use.   . Adhesive [Tape] Rash    PAST MEDICAL HISTORY Past Medical History:  Diagnosis Date  . Anemia   . Anemia associated with chronic renal failure   . Anemia in chronic kidney disease 09/29/2018  . Arthritis   . Asthma   . Cataract    OD  . CKD stage 3 due to type 2 diabetes mellitus ([place])   . Coronary artery calcification seen on CAT scan 02/17/2018   Coronary calcification on CT  . Depression   . Diabetic retinopathy of both eyes ([place])   . Diabetic ulcer of right foot associated with type 2 diabetes mellitus ([place])   . Essential hypertension 02/17/2018   Essential hypertension  . GERD (gastroesophageal reflux disease)    pt denies  . HLD (hyperlipidemia)   . Hypertension   . Hypertensive retinopathy    OU  . Left ventricular dysfunction 04/07/2018   Left ventricle dysfunction  . Microalbuminuria due to type 2 diabetes mellitus ([place])   . Neuromuscular disorder ([place])    diabetic neuropathy  . Nonproliferative retinopathy due to secondary diabetes ([place])   . Nonsustained ventricular tachycardia ([place]) 08/28/2018   Nonsustained ventricular tachycardia  . Pneumonia    hx of   . Renal cell cancer ([place])   . Right renal mass 03/10/2017  . Type 2 diabetes, controlled, with neuropathy ([place]) 06/22/2013  . Ulcer of other part of foot 06/22/2013  . 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 BASCILIC VEIN TRANSPOSITION Left 08/08/2017   Procedure: BASILIC VEIN TRANSPOSITION FIRST STAGE LEFT ARM;  Surgeon: Serafina Mitchell, MD;  Location: [place];  Service: Vascular;  Laterality: Left;  . BASCILIC VEIN TRANSPOSITION Left 05/14/2018   Procedure: SECOND STAGE BASILIC VEIN TRANSPOSITION LEFT ARM;  Surgeon: Serafina Mitchell, MD;  Location: [place];  Service: Vascular;  Laterality: Left;  . CATARACT EXTRACTION Left   . EYE SURGERY    . ROBOTIC ADRENALECTOMY Left 03/10/2017   Procedure: XI ROBOTIC ADRENALECTOMY;  Surgeon:  Nickie Retort, MD;  Location: [facility];  Service: Urology;  Laterality: Left;  . ROBOTIC ASSITED PARTIAL NEPHRECTOMY Right 03/10/2017   Procedure: XI ROBOTIC ASSITED RADICAL NEPHRECTOMY;  Surgeon: Nickie Retort, MD;  Location: [facility];  Service: Urology;  Laterality: Right;  . TOE AMPUTATION Bilateral    both great toe ,, left foot 2nd toe 1/2    FAMILY HISTORY Family History  Problem Relation Age of Onset  . Heart disease Mother   . Diabetes Sister     SOCIAL HISTORY Social History   Tobacco Use  . Smoking status: Never Smoker  . Smokeless tobacco: Never Used  Substance Use Topics  . Alcohol use: Yes    Alcohol/week: 0.0 standard drinks    Comment: occ  . Drug use: No         OPHTHALMIC  EXAM:  Base Eye Exam    Visual Acuity (Snellen - Linear)      Right Left   Dist [place] 20/60 20/30 +2   Dist ph [place] 20/40 -2 20/25 -1       Tonometry (Tonopen, 10:08 AM)      Right Left   Pressure 14 13       Pupils      Dark Light Shape React APD   Right 2 1 Round Minimal 0   Left 2 1 Round Minimal        Visual Fields      Left Right    Full Full       Extraocular Movement      Right Left    Full Full       Neuro/Psych    Oriented x3: Yes   Mood/Affect: Normal       Dilation    Both eyes: 1.0% Mydriacyl, 2.5% Phenylephrine @ 10:08 AM        Slit Lamp and Fundus Exam    Slit Lamp Exam      Right Left   Lids/Lashes Dermatochalasis - upper lid, mild MGD Dermatochalasis - upper lid, Telangiectasia, mild MGD   Conjunctiva/Sclera White and quiet White and quiet   Cornea 1+ Punctate epithelial erosions, mild Arcus Well-healed temporal cataract wound   Anterior Chamber Deep and quiet Deep   Iris Round and dilated, No NVI Round and well dilated   Lens 2-3+ Nuclear sclerosis with early brunescence, 3+ Cortical cataract, 2-3+ Posterior subcapsular cataract mild PCIOL in good position   Vitreous Vitreous syneresis, Posterior vitreous detachment Vitreous  syneresis       Fundus Exam      Right Left   Disc Pink and Sharp trace pallor, Sharp rim   C/D Ratio 0.3 0.2   Macula good foveal reflex, scattered MA and IRH greatest nasally, +cystic changes nasal macula Flat, Blunted foveal reflex, scattered MA nasal macula, cystic changes IN macula   Vessels Vascular attenuation, Tortuous, AV crossing changes Tortuous, Vascular attenuation, AV crossing changes   Periphery Attached, scattered IRH greatest posteriorly and small CWS nasal to disc - fading Attached, scattered MA/DBH          IMAGING AND PROCEDURES  Imaging and Procedures for '@TODAY'$ @  OCT, Retina - OU - Both Eyes       Right Eye Quality was good. Central Foveal Thickness: 246. Progression has improved. Findings include normal foveal contour, no SRF, no IRF (Persistent peripapillary cystic changes nasal macula -- improved).   Left Eye Quality was good. Central Foveal Thickness: 248. Progression has been stable. Findings include normal foveal contour, intraretinal fluid, no SRF, vitreomacular adhesion , intraretinal hyper-reflective material (Mild peristent IRF inferonasal macula).   Notes *Images captured and stored on drive  Diagnosis / Impression:  NFP; +IRF; no SRF OU noncentral DME OU (OS > OD) OS - Mild peristent IRF inferonasal macula  Clinical management:  See below  Abbreviations: NFP - Normal foveal profile. CME - cystoid macular edema. PED - pigment epithelial detachment. IRF - intraretinal fluid. SRF - subretinal fluid. EZ - ellipsoid zone. ERM - epiretinal membrane. ORA - outer retinal atrophy. ORT - outer retinal tubulation. SRHM - subretinal hyper-reflective material        Intravitreal Injection, Pharmacologic Agent - OS - Left Eye       Time Out 09/03/2019. 11:02 AM. Confirmed correct patient, procedure, site, and patient consented.   Anesthesia Topical anesthesia was used. Anesthetic  medications included Lidocaine 2%, Proparacaine 0.5%.    Procedure Preparation included 5% betadine to ocular surface, eyelid speculum. A 30 gauge needle was used.   Injection:  1.25 mg Bevacizumab (AVASTIN) SOLN   NDC: 97989-211-94, Lot: [id number]'$ , Expiration date: 10/06/2019   Route: Intravitreal, Site: Left Eye, Waste: 0 mL  Post-op Post injection exam found visual acuity of at least counting fingers. The patient tolerated the procedure well. There were no complications. The patient received written and verbal post procedure care education.                 ASSESSMENT/PLAN:    ICD-10-CM   1. Moderate nonproliferative diabetic retinopathy of both eyes with macular edema associated with type 2 diabetes mellitus (HCC)  10/19/2019 Intravitreal Injection, Pharmacologic Agent - OS - Left Eye  2. Retinal edema  H35.81 OCT, Retina - OU - Both Eyes  3. Essential hypertension  I10   4. Hypertensive retinopathy of both eyes  H35.033   5. Combined forms of age-related cataract of right eye  H25.811   6. Pseudophakia  Z96.1     1,2 Moderate non-proliferative diabetic retinopathy w/ DME OU (OS > OD)  - Initial exam shows scattered MA, BCVA 20/50 OS  - Initial FA (05/12/18) shows leaking MA, no NV OS  - Initial OCT showed diabetic macular edema OU (OS > OD)   - s/p IVA #1 OS (10.22.19), #2 (11.20.19), #3 (12.20.19), #4 (01.31.20), #5 (03.13.20), #6 (04.22.20), #7 (06.05.20), #8 (07.17.20), #9 (08.28.20),#10 (10.07.20), #11 (12.04.20)  - today BCVA 20/30 OD (mostly limited by cataract), 20/25 OS   - OCT - mild peristent IRF inferonasal macula   - recommend IVA #12 OS today, 02.12.21 w/ extension to 12 wks  - discussed possible need for IVA OD in the future  - pt wishes to proceed IVA OS  - RBA of procedure discussed, questions answered  - informed consent obtained  - Avastin informed consent form signed and scanned on 12.04.2020  - see procedure note  - f/u in 12 wks, DFE, OCT, possible injxn(s)  3,4. Hypertensive retinopathy OU  -  discussed importance of tight BP control  - monitor  5. Combined Form Cataracts OD  - The symptoms of cataract, surgical options, and treatments and risks were discussed with patient.  - discussed diagnosis and progression  - visually significant OD  - under the expert management of Dr. 25.04.2020  - clear from a retina standpoint to proceed with cataract surgery when pt and surgeon are ready  - recommend coordinating care so that pt receives anti-VEGF 1-2 wks prior to cataract surgery, if possible  - patient states she's waiting to schedule surgery based on finances  6. Pseudophakia OS  - s/p CE/IOL OS w/ Dr. [name]  - beautiful surgery, doing well  - monitor   Ophthalmic Meds Ordered this visit:  No orders of the defined types were placed in this encounter.      Return in about 12 weeks (around 11/26/2019) for f/u NPDR OU, DFE, OCT.  There are no Patient Instructions on file for this visit.   Explained the diagnoses, plan, and follow up with the patient and they expressed understanding.  Patient expressed understanding of the importance of proper follow up care.   This document serves as a record of services personally performed by 18/01/2020, MD, PhD. It was created on their behalf by Gardiner Sleeper, OA, an ophthalmic assistant. The creation of this record is the provider's dictation and/or  activities during the visit.    Electronically signed by: Ernest Mallick, OA 02.05.2021 12:15 AM   Gardiner Sleeper, M.D., Ph.D. Diseases & Surgery of the Retina and Vitreous [facility]   I have reviewed the above documentation for accuracy and completeness, and I agree with the above. Gardiner Sleeper, M.D., Ph.D. 09/04/19 12:20 AM   Abbreviations: M myopia (nearsighted); A astigmatism; H hyperopia (farsighted); P presbyopia; Mrx spectacle prescription;  CTL contact lenses; OD right eye; OS left eye; OU both eyes  XT exotropia; ET esotropia; PEK punctate epithelial  keratitis; PEE punctate epithelial erosions; DES dry eye syndrome; MGD meibomian gland dysfunction; ATs artificial tears; PFAT's preservative free artificial tears; [place] nuclear sclerotic cataract; PSC posterior subcapsular cataract; ERM epi-retinal membrane; PVD posterior vitreous detachment; RD retinal detachment; DM diabetes mellitus; DR diabetic retinopathy; NPDR non-proliferative diabetic retinopathy; PDR proliferative diabetic retinopathy; CSME clinically significant macular edema; DME diabetic macular edema; dbh dot blot hemorrhages; CWS cotton wool spot; POAG primary open angle glaucoma; C/D cup-to-disc ratio; HVF humphrey visual field; GVF goldmann visual field; OCT optical coherence tomography; IOP intraocular pressure; BRVO Branch retinal vein occlusion; CRVO central retinal vein occlusion; CRAO central retinal artery occlusion; BRAO branch retinal artery occlusion; RT retinal tear; SB scleral buckle; PPV pars plana vitrectomy; VH Vitreous hemorrhage; PRP panretinal laser photocoagulation; IVK intravitreal kenalog; VMT vitreomacular traction; MH Macular hole;  NVD neovascularization of the disc; NVE neovascularization elsewhere; AREDS age related eye disease study; ARMD age related macular degeneration; POAG primary open angle glaucoma; EBMD epithelial/anterior basement membrane dystrophy; ACIOL anterior chamber intraocular lens; IOL intraocular lens; PCIOL posterior chamber intraocular lens; Phaco/IOL phacoemulsification with intraocular lens placement; [place] photorefractive keratectomy; LASIK laser assisted in situ keratomileusis; HTN hypertension; DM diabetes mellitus; COPD chronic obstructive pulmonary disease

## 2019-08-28 DIAGNOSIS — D689 Coagulation defect, unspecified: Secondary | ICD-10-CM | POA: Diagnosis not present

## 2019-08-28 DIAGNOSIS — E1129 Type 2 diabetes mellitus with other diabetic kidney complication: Secondary | ICD-10-CM | POA: Diagnosis not present

## 2019-08-28 DIAGNOSIS — Z992 Dependence on renal dialysis: Secondary | ICD-10-CM | POA: Diagnosis not present

## 2019-08-28 DIAGNOSIS — N2581 Secondary hyperparathyroidism of renal origin: Secondary | ICD-10-CM | POA: Diagnosis not present

## 2019-08-28 DIAGNOSIS — N186 End stage renal disease: Secondary | ICD-10-CM | POA: Diagnosis not present

## 2019-08-28 DIAGNOSIS — D631 Anemia in chronic kidney disease: Secondary | ICD-10-CM | POA: Diagnosis not present

## 2019-08-28 DIAGNOSIS — D509 Iron deficiency anemia, unspecified: Secondary | ICD-10-CM | POA: Diagnosis not present

## 2019-08-28 DIAGNOSIS — Z283 Underimmunization status: Secondary | ICD-10-CM | POA: Diagnosis not present

## 2019-08-30 ENCOUNTER — Other Ambulatory Visit: Payer: Self-pay

## 2019-08-30 NOTE — Patient Outreach (Signed)
[facility]) Care Management  08/30/2019  CERIA SUMINSKI [birth year] [id number]   [facility] Social Work case has been transitioned to [place].  [name], BSW Social Worker [phone]

## 2019-08-31 ENCOUNTER — Encounter: Payer: Self-pay | Admitting: *Deleted

## 2019-08-31 ENCOUNTER — Other Ambulatory Visit: Payer: Self-pay | Admitting: *Deleted

## 2019-08-31 DIAGNOSIS — N2581 Secondary hyperparathyroidism of renal origin: Secondary | ICD-10-CM | POA: Diagnosis not present

## 2019-08-31 DIAGNOSIS — D631 Anemia in chronic kidney disease: Secondary | ICD-10-CM | POA: Diagnosis not present

## 2019-08-31 DIAGNOSIS — E1129 Type 2 diabetes mellitus with other diabetic kidney complication: Secondary | ICD-10-CM | POA: Diagnosis not present

## 2019-08-31 DIAGNOSIS — D689 Coagulation defect, unspecified: Secondary | ICD-10-CM | POA: Diagnosis not present

## 2019-08-31 DIAGNOSIS — D509 Iron deficiency anemia, unspecified: Secondary | ICD-10-CM | POA: Diagnosis not present

## 2019-08-31 DIAGNOSIS — N186 End stage renal disease: Secondary | ICD-10-CM | POA: Diagnosis not present

## 2019-08-31 DIAGNOSIS — Z283 Underimmunization status: Secondary | ICD-10-CM | POA: Diagnosis not present

## 2019-08-31 DIAGNOSIS — Z992 Dependence on renal dialysis: Secondary | ICD-10-CM | POA: Diagnosis not present

## 2019-08-31 NOTE — Patient Outreach (Signed)
[place] [facility]) Care Management  08/31/2019  [name] Feb 08, 1951 [id number]   CSW made an initial attempt to try and contact patient today to follow-up regarding home modifications, after receiving this new referral from [employer], social work Social worker, also with [facility]; however, patient was unavailable at the time of CSW's call.  A HIPAA compliant message was left for patient on voicemail and CSW is currently awaiting a return call.  CSW will make a second outreach attempt within the next 3-4 business days, if a return call is not received from patient in the meantime.  CSW will also mail an Outreach Letter to patient's home requesting that patient contact CSW if she is interested in receiving social work services through [place] with Scientist, clinical (histocompatibility and immunogenetics).  [name], BSW, MSW, LCSW  Licensed Education officer, environmental Health System  Mailing [place] [address] [place], [place] [address] Physical Address[address], [place], [place] [address] Toll Free Main # [phone] Fax # [phone] Cell # [phone]  Office # [phone] [email or web address]

## 2019-09-02 DIAGNOSIS — D689 Coagulation defect, unspecified: Secondary | ICD-10-CM | POA: Diagnosis not present

## 2019-09-02 DIAGNOSIS — N186 End stage renal disease: Secondary | ICD-10-CM | POA: Diagnosis not present

## 2019-09-02 DIAGNOSIS — N2581 Secondary hyperparathyroidism of renal origin: Secondary | ICD-10-CM | POA: Diagnosis not present

## 2019-09-02 DIAGNOSIS — Z283 Underimmunization status: Secondary | ICD-10-CM | POA: Diagnosis not present

## 2019-09-02 DIAGNOSIS — Z992 Dependence on renal dialysis: Secondary | ICD-10-CM | POA: Diagnosis not present

## 2019-09-02 DIAGNOSIS — D509 Iron deficiency anemia, unspecified: Secondary | ICD-10-CM | POA: Diagnosis not present

## 2019-09-02 DIAGNOSIS — D631 Anemia in chronic kidney disease: Secondary | ICD-10-CM | POA: Diagnosis not present

## 2019-09-02 DIAGNOSIS — E1129 Type 2 diabetes mellitus with other diabetic kidney complication: Secondary | ICD-10-CM | POA: Diagnosis not present

## 2019-09-03 ENCOUNTER — Other Ambulatory Visit: Payer: Self-pay

## 2019-09-03 ENCOUNTER — Ambulatory Visit (INDEPENDENT_AMBULATORY_CARE_PROVIDER_SITE_OTHER): Payer: Medicare Other | Admitting: Ophthalmology

## 2019-09-03 ENCOUNTER — Other Ambulatory Visit: Payer: Self-pay | Admitting: *Deleted

## 2019-09-03 DIAGNOSIS — H25811 Combined forms of age-related cataract, right eye: Secondary | ICD-10-CM

## 2019-09-03 DIAGNOSIS — H35033 Hypertensive retinopathy, bilateral: Secondary | ICD-10-CM

## 2019-09-03 DIAGNOSIS — E113313 Type 2 diabetes mellitus with moderate nonproliferative diabetic retinopathy with macular edema, bilateral: Secondary | ICD-10-CM

## 2019-09-03 DIAGNOSIS — I10 Essential (primary) hypertension: Secondary | ICD-10-CM

## 2019-09-03 DIAGNOSIS — H3581 Retinal edema: Secondary | ICD-10-CM

## 2019-09-03 DIAGNOSIS — Z961 Presence of intraocular lens: Secondary | ICD-10-CM

## 2019-09-03 NOTE — Patient Outreach (Signed)
[place] [facility]) Care Management  09/03/2019  ZIAN MOHAMED Feb 18, 1951 [id number]   CSW made a second attempt to try and contact patient today to perform phone assessment, as well as assess and assist with social work needs and services, without success.  A HIPAA compliant message was left for patient on voicemail, explaining that patient's case has been transitioned from [employer], social work Social worker, also with Scientist, clinical (histocompatibility and immunogenetics), to [place].  CSW continues to await a return call.  CSW will make a third and final outreach attempt within the next 3-4 business days, if a return call is not received from patient in the meantime.  CSW will then proceed with case closure if a return call is not received from patient with a total of 10 business days, as required number of phone attempts will have been made and outreach letter mailed.   [name], BSW, MSW, LCSW  Licensed Education officer, environmental Health System  Mailing [place] [address], [place], [place] [address] Physical Address[address], [place], [place] [address] Toll Free Main # [phone] Fax # [phone] Cell # [phone]  Office # [phone] [email or web address]

## 2019-09-04 ENCOUNTER — Encounter (INDEPENDENT_AMBULATORY_CARE_PROVIDER_SITE_OTHER): Payer: Self-pay | Admitting: Ophthalmology

## 2019-09-04 DIAGNOSIS — E1129 Type 2 diabetes mellitus with other diabetic kidney complication: Secondary | ICD-10-CM | POA: Diagnosis not present

## 2019-09-04 DIAGNOSIS — Z992 Dependence on renal dialysis: Secondary | ICD-10-CM | POA: Diagnosis not present

## 2019-09-04 DIAGNOSIS — N2581 Secondary hyperparathyroidism of renal origin: Secondary | ICD-10-CM | POA: Diagnosis not present

## 2019-09-04 DIAGNOSIS — N186 End stage renal disease: Secondary | ICD-10-CM | POA: Diagnosis not present

## 2019-09-04 DIAGNOSIS — D631 Anemia in chronic kidney disease: Secondary | ICD-10-CM | POA: Diagnosis not present

## 2019-09-04 DIAGNOSIS — D509 Iron deficiency anemia, unspecified: Secondary | ICD-10-CM | POA: Diagnosis not present

## 2019-09-04 DIAGNOSIS — Z283 Underimmunization status: Secondary | ICD-10-CM | POA: Diagnosis not present

## 2019-09-04 DIAGNOSIS — D689 Coagulation defect, unspecified: Secondary | ICD-10-CM | POA: Diagnosis not present

## 2019-09-04 MED ORDER — BEVACIZUMAB CHEMO INJECTION 1.25MG/0.05ML SYRINGE FOR KALEIDOSCOPE
1.2500 mg | INTRAVITREAL | Status: AC | PRN
  Administered 2019-09-04: 1.25 mg via INTRAVITREAL

## 2019-09-07 DIAGNOSIS — Z992 Dependence on renal dialysis: Secondary | ICD-10-CM | POA: Diagnosis not present

## 2019-09-07 DIAGNOSIS — N186 End stage renal disease: Secondary | ICD-10-CM | POA: Diagnosis not present

## 2019-09-07 DIAGNOSIS — D689 Coagulation defect, unspecified: Secondary | ICD-10-CM | POA: Diagnosis not present

## 2019-09-07 DIAGNOSIS — D631 Anemia in chronic kidney disease: Secondary | ICD-10-CM | POA: Diagnosis not present

## 2019-09-07 DIAGNOSIS — E1129 Type 2 diabetes mellitus with other diabetic kidney complication: Secondary | ICD-10-CM | POA: Diagnosis not present

## 2019-09-07 DIAGNOSIS — N2581 Secondary hyperparathyroidism of renal origin: Secondary | ICD-10-CM | POA: Diagnosis not present

## 2019-09-07 DIAGNOSIS — Z283 Underimmunization status: Secondary | ICD-10-CM | POA: Diagnosis not present

## 2019-09-07 DIAGNOSIS — D509 Iron deficiency anemia, unspecified: Secondary | ICD-10-CM | POA: Diagnosis not present

## 2019-09-09 DIAGNOSIS — E1129 Type 2 diabetes mellitus with other diabetic kidney complication: Secondary | ICD-10-CM | POA: Diagnosis not present

## 2019-09-09 DIAGNOSIS — D631 Anemia in chronic kidney disease: Secondary | ICD-10-CM | POA: Diagnosis not present

## 2019-09-09 DIAGNOSIS — D689 Coagulation defect, unspecified: Secondary | ICD-10-CM | POA: Diagnosis not present

## 2019-09-09 DIAGNOSIS — N2581 Secondary hyperparathyroidism of renal origin: Secondary | ICD-10-CM | POA: Diagnosis not present

## 2019-09-09 DIAGNOSIS — Z992 Dependence on renal dialysis: Secondary | ICD-10-CM | POA: Diagnosis not present

## 2019-09-09 DIAGNOSIS — N186 End stage renal disease: Secondary | ICD-10-CM | POA: Diagnosis not present

## 2019-09-09 DIAGNOSIS — D509 Iron deficiency anemia, unspecified: Secondary | ICD-10-CM | POA: Diagnosis not present

## 2019-09-09 DIAGNOSIS — Z283 Underimmunization status: Secondary | ICD-10-CM | POA: Diagnosis not present

## 2019-09-10 ENCOUNTER — Other Ambulatory Visit: Payer: Self-pay | Admitting: *Deleted

## 2019-09-10 NOTE — Patient Outreach (Signed)
[facility] [facility]) Care Management  09/10/2019  BASSHEVA FLURY [birth year] [id number]    CSW made a third and final attempt to try and contact patient today to perform phone assessment, as well as assess and assist with social work needs and services, without success.  A HIPAA compliant message was left for patient on voicemail.  CSW continues to await a return call.  CSW will proceed with case closure in two business days, if a return call is not received from patient in the meantime, as required number of phone attempts have been made and an outreach letter was mailed to patient's home allowing 10 business days for a response, if patient was interested in receiving social work services through [place] with Scientist, clinical (histocompatibility and immunogenetics).  [name], BSW, MSW, LCSW  Licensed Education officer, environmental Health System  Mailing [place] [address] [place], [place] [address] Physical Address[address], [place], [place] [address] Toll Free Main # [phone] Fax # [phone] Cell # [phone]  Office # [phone] [email or web address]

## 2019-09-11 DIAGNOSIS — D509 Iron deficiency anemia, unspecified: Secondary | ICD-10-CM | POA: Diagnosis not present

## 2019-09-11 DIAGNOSIS — Z992 Dependence on renal dialysis: Secondary | ICD-10-CM | POA: Diagnosis not present

## 2019-09-11 DIAGNOSIS — Z283 Underimmunization status: Secondary | ICD-10-CM | POA: Diagnosis not present

## 2019-09-11 DIAGNOSIS — N186 End stage renal disease: Secondary | ICD-10-CM | POA: Diagnosis not present

## 2019-09-11 DIAGNOSIS — E1129 Type 2 diabetes mellitus with other diabetic kidney complication: Secondary | ICD-10-CM | POA: Diagnosis not present

## 2019-09-11 DIAGNOSIS — D631 Anemia in chronic kidney disease: Secondary | ICD-10-CM | POA: Diagnosis not present

## 2019-09-11 DIAGNOSIS — N2581 Secondary hyperparathyroidism of renal origin: Secondary | ICD-10-CM | POA: Diagnosis not present

## 2019-09-11 DIAGNOSIS — D689 Coagulation defect, unspecified: Secondary | ICD-10-CM | POA: Diagnosis not present

## 2019-09-13 ENCOUNTER — Ambulatory Visit: Payer: Self-pay | Admitting: *Deleted

## 2019-09-14 ENCOUNTER — Other Ambulatory Visit: Payer: Self-pay | Admitting: *Deleted

## 2019-09-14 ENCOUNTER — Encounter: Payer: Self-pay | Admitting: *Deleted

## 2019-09-14 DIAGNOSIS — D509 Iron deficiency anemia, unspecified: Secondary | ICD-10-CM | POA: Diagnosis not present

## 2019-09-14 DIAGNOSIS — E1129 Type 2 diabetes mellitus with other diabetic kidney complication: Secondary | ICD-10-CM | POA: Diagnosis not present

## 2019-09-14 DIAGNOSIS — D631 Anemia in chronic kidney disease: Secondary | ICD-10-CM | POA: Diagnosis not present

## 2019-09-14 DIAGNOSIS — Z992 Dependence on renal dialysis: Secondary | ICD-10-CM | POA: Diagnosis not present

## 2019-09-14 DIAGNOSIS — N2581 Secondary hyperparathyroidism of renal origin: Secondary | ICD-10-CM | POA: Diagnosis not present

## 2019-09-14 DIAGNOSIS — N186 End stage renal disease: Secondary | ICD-10-CM | POA: Diagnosis not present

## 2019-09-14 DIAGNOSIS — Z283 Underimmunization status: Secondary | ICD-10-CM | POA: Diagnosis not present

## 2019-09-14 DIAGNOSIS — D689 Coagulation defect, unspecified: Secondary | ICD-10-CM | POA: Diagnosis not present

## 2019-09-14 NOTE — Patient Outreach (Signed)
[facility]) Care Management  09/14/2019  [name] Sep 01, 1950 [id number]  CSW will perform a case closure on patient, due to inability to establish initial phone contact with patient, despite required number of phone attempts made and outreach letter mailed to patient's home, allowing 10 business days for a response if patient was interested in receiving social work services through [place] with [facility].  CSW will notify social work Social worker, Geographical information systems officer, also with [facility], of CSW's plans to close patient's case.  CSW will fax an update to patient's Primary Care Physician, Dr. [name] to ensure that they are aware of CSW's involvement with patient's plan of care.    [name], BSW, MSW, LCSW  Licensed Education officer, [facility]  Mailing [place] [address] [place], [place] [address] Physical Address-[address], [place], [place] [address] Toll Free Main # [phone] Fax # [phone] Cell # [phone]  Office # [phone] [email or web address]

## 2019-09-16 DIAGNOSIS — N186 End stage renal disease: Secondary | ICD-10-CM | POA: Diagnosis not present

## 2019-09-16 DIAGNOSIS — Z283 Underimmunization status: Secondary | ICD-10-CM | POA: Diagnosis not present

## 2019-09-16 DIAGNOSIS — N2581 Secondary hyperparathyroidism of renal origin: Secondary | ICD-10-CM | POA: Diagnosis not present

## 2019-09-16 DIAGNOSIS — D509 Iron deficiency anemia, unspecified: Secondary | ICD-10-CM | POA: Diagnosis not present

## 2019-09-16 DIAGNOSIS — E1129 Type 2 diabetes mellitus with other diabetic kidney complication: Secondary | ICD-10-CM | POA: Diagnosis not present

## 2019-09-16 DIAGNOSIS — D631 Anemia in chronic kidney disease: Secondary | ICD-10-CM | POA: Diagnosis not present

## 2019-09-16 DIAGNOSIS — Z992 Dependence on renal dialysis: Secondary | ICD-10-CM | POA: Diagnosis not present

## 2019-09-16 DIAGNOSIS — D689 Coagulation defect, unspecified: Secondary | ICD-10-CM | POA: Diagnosis not present

## 2019-09-18 DIAGNOSIS — D631 Anemia in chronic kidney disease: Secondary | ICD-10-CM | POA: Diagnosis not present

## 2019-09-18 DIAGNOSIS — E1129 Type 2 diabetes mellitus with other diabetic kidney complication: Secondary | ICD-10-CM | POA: Diagnosis not present

## 2019-09-18 DIAGNOSIS — Z283 Underimmunization status: Secondary | ICD-10-CM | POA: Diagnosis not present

## 2019-09-18 DIAGNOSIS — D509 Iron deficiency anemia, unspecified: Secondary | ICD-10-CM | POA: Diagnosis not present

## 2019-09-18 DIAGNOSIS — N186 End stage renal disease: Secondary | ICD-10-CM | POA: Diagnosis not present

## 2019-09-18 DIAGNOSIS — N2581 Secondary hyperparathyroidism of renal origin: Secondary | ICD-10-CM | POA: Diagnosis not present

## 2019-09-18 DIAGNOSIS — D689 Coagulation defect, unspecified: Secondary | ICD-10-CM | POA: Diagnosis not present

## 2019-09-18 DIAGNOSIS — Z992 Dependence on renal dialysis: Secondary | ICD-10-CM | POA: Diagnosis not present

## 2019-09-19 DIAGNOSIS — Z992 Dependence on renal dialysis: Secondary | ICD-10-CM | POA: Diagnosis not present

## 2019-09-19 DIAGNOSIS — N186 End stage renal disease: Secondary | ICD-10-CM | POA: Diagnosis not present

## 2019-09-19 DIAGNOSIS — I129 Hypertensive chronic kidney disease with stage 1 through stage 4 chronic kidney disease, or unspecified chronic kidney disease: Secondary | ICD-10-CM | POA: Diagnosis not present

## 2019-09-21 DIAGNOSIS — N186 End stage renal disease: Secondary | ICD-10-CM | POA: Diagnosis not present

## 2019-09-21 DIAGNOSIS — N2581 Secondary hyperparathyroidism of renal origin: Secondary | ICD-10-CM | POA: Diagnosis not present

## 2019-09-21 DIAGNOSIS — D689 Coagulation defect, unspecified: Secondary | ICD-10-CM | POA: Diagnosis not present

## 2019-09-21 DIAGNOSIS — D631 Anemia in chronic kidney disease: Secondary | ICD-10-CM | POA: Diagnosis not present

## 2019-09-21 DIAGNOSIS — D509 Iron deficiency anemia, unspecified: Secondary | ICD-10-CM | POA: Diagnosis not present

## 2019-09-21 DIAGNOSIS — Z992 Dependence on renal dialysis: Secondary | ICD-10-CM | POA: Diagnosis not present

## 2019-09-23 ENCOUNTER — Ambulatory Visit: Payer: Medicare Other

## 2019-09-23 ENCOUNTER — Ambulatory Visit: Payer: Medicare Other | Admitting: *Deleted

## 2019-09-23 DIAGNOSIS — D631 Anemia in chronic kidney disease: Secondary | ICD-10-CM | POA: Diagnosis not present

## 2019-09-23 DIAGNOSIS — N2581 Secondary hyperparathyroidism of renal origin: Secondary | ICD-10-CM | POA: Diagnosis not present

## 2019-09-23 DIAGNOSIS — Z992 Dependence on renal dialysis: Secondary | ICD-10-CM | POA: Diagnosis not present

## 2019-09-23 DIAGNOSIS — N186 End stage renal disease: Secondary | ICD-10-CM | POA: Diagnosis not present

## 2019-09-23 DIAGNOSIS — D509 Iron deficiency anemia, unspecified: Secondary | ICD-10-CM | POA: Diagnosis not present

## 2019-09-23 DIAGNOSIS — D689 Coagulation defect, unspecified: Secondary | ICD-10-CM | POA: Diagnosis not present

## 2019-09-25 DIAGNOSIS — N2581 Secondary hyperparathyroidism of renal origin: Secondary | ICD-10-CM | POA: Diagnosis not present

## 2019-09-25 DIAGNOSIS — Z992 Dependence on renal dialysis: Secondary | ICD-10-CM | POA: Diagnosis not present

## 2019-09-25 DIAGNOSIS — D631 Anemia in chronic kidney disease: Secondary | ICD-10-CM | POA: Diagnosis not present

## 2019-09-25 DIAGNOSIS — D509 Iron deficiency anemia, unspecified: Secondary | ICD-10-CM | POA: Diagnosis not present

## 2019-09-25 DIAGNOSIS — N186 End stage renal disease: Secondary | ICD-10-CM | POA: Diagnosis not present

## 2019-09-25 DIAGNOSIS — D689 Coagulation defect, unspecified: Secondary | ICD-10-CM | POA: Diagnosis not present

## 2019-09-28 DIAGNOSIS — N186 End stage renal disease: Secondary | ICD-10-CM | POA: Diagnosis not present

## 2019-09-28 DIAGNOSIS — D509 Iron deficiency anemia, unspecified: Secondary | ICD-10-CM | POA: Diagnosis not present

## 2019-09-28 DIAGNOSIS — Z992 Dependence on renal dialysis: Secondary | ICD-10-CM | POA: Diagnosis not present

## 2019-09-28 DIAGNOSIS — D631 Anemia in chronic kidney disease: Secondary | ICD-10-CM | POA: Diagnosis not present

## 2019-09-28 DIAGNOSIS — N2581 Secondary hyperparathyroidism of renal origin: Secondary | ICD-10-CM | POA: Diagnosis not present

## 2019-09-28 DIAGNOSIS — D689 Coagulation defect, unspecified: Secondary | ICD-10-CM | POA: Diagnosis not present

## 2019-09-30 ENCOUNTER — Other Ambulatory Visit: Payer: Self-pay | Admitting: Family Medicine

## 2019-09-30 DIAGNOSIS — D509 Iron deficiency anemia, unspecified: Secondary | ICD-10-CM | POA: Diagnosis not present

## 2019-09-30 DIAGNOSIS — D631 Anemia in chronic kidney disease: Secondary | ICD-10-CM | POA: Diagnosis not present

## 2019-09-30 DIAGNOSIS — N2581 Secondary hyperparathyroidism of renal origin: Secondary | ICD-10-CM | POA: Diagnosis not present

## 2019-09-30 DIAGNOSIS — N186 End stage renal disease: Secondary | ICD-10-CM | POA: Diagnosis not present

## 2019-09-30 DIAGNOSIS — D689 Coagulation defect, unspecified: Secondary | ICD-10-CM | POA: Diagnosis not present

## 2019-09-30 DIAGNOSIS — Z992 Dependence on renal dialysis: Secondary | ICD-10-CM | POA: Diagnosis not present

## 2019-10-02 DIAGNOSIS — D689 Coagulation defect, unspecified: Secondary | ICD-10-CM | POA: Diagnosis not present

## 2019-10-02 DIAGNOSIS — Z992 Dependence on renal dialysis: Secondary | ICD-10-CM | POA: Diagnosis not present

## 2019-10-02 DIAGNOSIS — D631 Anemia in chronic kidney disease: Secondary | ICD-10-CM | POA: Diagnosis not present

## 2019-10-02 DIAGNOSIS — D509 Iron deficiency anemia, unspecified: Secondary | ICD-10-CM | POA: Diagnosis not present

## 2019-10-02 DIAGNOSIS — N2581 Secondary hyperparathyroidism of renal origin: Secondary | ICD-10-CM | POA: Diagnosis not present

## 2019-10-02 DIAGNOSIS — N186 End stage renal disease: Secondary | ICD-10-CM | POA: Diagnosis not present

## 2019-10-05 DIAGNOSIS — D509 Iron deficiency anemia, unspecified: Secondary | ICD-10-CM | POA: Diagnosis not present

## 2019-10-05 DIAGNOSIS — Z992 Dependence on renal dialysis: Secondary | ICD-10-CM | POA: Diagnosis not present

## 2019-10-05 DIAGNOSIS — D689 Coagulation defect, unspecified: Secondary | ICD-10-CM | POA: Diagnosis not present

## 2019-10-05 DIAGNOSIS — N186 End stage renal disease: Secondary | ICD-10-CM | POA: Diagnosis not present

## 2019-10-05 DIAGNOSIS — N2581 Secondary hyperparathyroidism of renal origin: Secondary | ICD-10-CM | POA: Diagnosis not present

## 2019-10-05 DIAGNOSIS — D631 Anemia in chronic kidney disease: Secondary | ICD-10-CM | POA: Diagnosis not present

## 2019-10-07 DIAGNOSIS — D509 Iron deficiency anemia, unspecified: Secondary | ICD-10-CM | POA: Diagnosis not present

## 2019-10-07 DIAGNOSIS — D631 Anemia in chronic kidney disease: Secondary | ICD-10-CM | POA: Diagnosis not present

## 2019-10-07 DIAGNOSIS — N186 End stage renal disease: Secondary | ICD-10-CM | POA: Diagnosis not present

## 2019-10-07 DIAGNOSIS — Z992 Dependence on renal dialysis: Secondary | ICD-10-CM | POA: Diagnosis not present

## 2019-10-07 DIAGNOSIS — N2581 Secondary hyperparathyroidism of renal origin: Secondary | ICD-10-CM | POA: Diagnosis not present

## 2019-10-07 DIAGNOSIS — D689 Coagulation defect, unspecified: Secondary | ICD-10-CM | POA: Diagnosis not present

## 2019-10-09 DIAGNOSIS — Z992 Dependence on renal dialysis: Secondary | ICD-10-CM | POA: Diagnosis not present

## 2019-10-09 DIAGNOSIS — N2581 Secondary hyperparathyroidism of renal origin: Secondary | ICD-10-CM | POA: Diagnosis not present

## 2019-10-09 DIAGNOSIS — D631 Anemia in chronic kidney disease: Secondary | ICD-10-CM | POA: Diagnosis not present

## 2019-10-09 DIAGNOSIS — D509 Iron deficiency anemia, unspecified: Secondary | ICD-10-CM | POA: Diagnosis not present

## 2019-10-09 DIAGNOSIS — D689 Coagulation defect, unspecified: Secondary | ICD-10-CM | POA: Diagnosis not present

## 2019-10-09 DIAGNOSIS — N186 End stage renal disease: Secondary | ICD-10-CM | POA: Diagnosis not present

## 2019-10-11 DIAGNOSIS — H25011 Cortical age-related cataract, right eye: Secondary | ICD-10-CM | POA: Diagnosis not present

## 2019-10-11 DIAGNOSIS — H2511 Age-related nuclear cataract, right eye: Secondary | ICD-10-CM | POA: Diagnosis not present

## 2019-10-11 DIAGNOSIS — E113393 Type 2 diabetes mellitus with moderate nonproliferative diabetic retinopathy without macular edema, bilateral: Secondary | ICD-10-CM | POA: Diagnosis not present

## 2019-10-11 DIAGNOSIS — Z961 Presence of intraocular lens: Secondary | ICD-10-CM | POA: Diagnosis not present

## 2019-10-12 DIAGNOSIS — D689 Coagulation defect, unspecified: Secondary | ICD-10-CM | POA: Diagnosis not present

## 2019-10-12 DIAGNOSIS — D631 Anemia in chronic kidney disease: Secondary | ICD-10-CM | POA: Diagnosis not present

## 2019-10-12 DIAGNOSIS — N186 End stage renal disease: Secondary | ICD-10-CM | POA: Diagnosis not present

## 2019-10-12 DIAGNOSIS — Z992 Dependence on renal dialysis: Secondary | ICD-10-CM | POA: Diagnosis not present

## 2019-10-12 DIAGNOSIS — N2581 Secondary hyperparathyroidism of renal origin: Secondary | ICD-10-CM | POA: Diagnosis not present

## 2019-10-12 DIAGNOSIS — D509 Iron deficiency anemia, unspecified: Secondary | ICD-10-CM | POA: Diagnosis not present

## 2019-10-13 ENCOUNTER — Other Ambulatory Visit: Payer: Self-pay | Admitting: Family Medicine

## 2019-10-14 DIAGNOSIS — N2581 Secondary hyperparathyroidism of renal origin: Secondary | ICD-10-CM | POA: Diagnosis not present

## 2019-10-14 DIAGNOSIS — D631 Anemia in chronic kidney disease: Secondary | ICD-10-CM | POA: Diagnosis not present

## 2019-10-14 DIAGNOSIS — D509 Iron deficiency anemia, unspecified: Secondary | ICD-10-CM | POA: Diagnosis not present

## 2019-10-14 DIAGNOSIS — D689 Coagulation defect, unspecified: Secondary | ICD-10-CM | POA: Diagnosis not present

## 2019-10-14 DIAGNOSIS — N186 End stage renal disease: Secondary | ICD-10-CM | POA: Diagnosis not present

## 2019-10-14 DIAGNOSIS — Z992 Dependence on renal dialysis: Secondary | ICD-10-CM | POA: Diagnosis not present

## 2019-10-16 DIAGNOSIS — D631 Anemia in chronic kidney disease: Secondary | ICD-10-CM | POA: Diagnosis not present

## 2019-10-16 DIAGNOSIS — D509 Iron deficiency anemia, unspecified: Secondary | ICD-10-CM | POA: Diagnosis not present

## 2019-10-16 DIAGNOSIS — N2581 Secondary hyperparathyroidism of renal origin: Secondary | ICD-10-CM | POA: Diagnosis not present

## 2019-10-16 DIAGNOSIS — N186 End stage renal disease: Secondary | ICD-10-CM | POA: Diagnosis not present

## 2019-10-16 DIAGNOSIS — Z992 Dependence on renal dialysis: Secondary | ICD-10-CM | POA: Diagnosis not present

## 2019-10-16 DIAGNOSIS — D689 Coagulation defect, unspecified: Secondary | ICD-10-CM | POA: Diagnosis not present

## 2019-10-19 DIAGNOSIS — D689 Coagulation defect, unspecified: Secondary | ICD-10-CM | POA: Diagnosis not present

## 2019-10-19 DIAGNOSIS — N186 End stage renal disease: Secondary | ICD-10-CM | POA: Diagnosis not present

## 2019-10-19 DIAGNOSIS — D509 Iron deficiency anemia, unspecified: Secondary | ICD-10-CM | POA: Diagnosis not present

## 2019-10-19 DIAGNOSIS — N2581 Secondary hyperparathyroidism of renal origin: Secondary | ICD-10-CM | POA: Diagnosis not present

## 2019-10-19 DIAGNOSIS — D631 Anemia in chronic kidney disease: Secondary | ICD-10-CM | POA: Diagnosis not present

## 2019-10-19 DIAGNOSIS — Z992 Dependence on renal dialysis: Secondary | ICD-10-CM | POA: Diagnosis not present

## 2019-10-20 DIAGNOSIS — I129 Hypertensive chronic kidney disease with stage 1 through stage 4 chronic kidney disease, or unspecified chronic kidney disease: Secondary | ICD-10-CM | POA: Diagnosis not present

## 2019-10-20 DIAGNOSIS — Z992 Dependence on renal dialysis: Secondary | ICD-10-CM | POA: Diagnosis not present

## 2019-10-20 DIAGNOSIS — N186 End stage renal disease: Secondary | ICD-10-CM | POA: Diagnosis not present

## 2019-10-21 DIAGNOSIS — D631 Anemia in chronic kidney disease: Secondary | ICD-10-CM | POA: Diagnosis not present

## 2019-10-21 DIAGNOSIS — Z992 Dependence on renal dialysis: Secondary | ICD-10-CM | POA: Diagnosis not present

## 2019-10-21 DIAGNOSIS — N2581 Secondary hyperparathyroidism of renal origin: Secondary | ICD-10-CM | POA: Diagnosis not present

## 2019-10-21 DIAGNOSIS — N186 End stage renal disease: Secondary | ICD-10-CM | POA: Diagnosis not present

## 2019-10-21 DIAGNOSIS — E1129 Type 2 diabetes mellitus with other diabetic kidney complication: Secondary | ICD-10-CM | POA: Diagnosis not present

## 2019-10-21 DIAGNOSIS — D509 Iron deficiency anemia, unspecified: Secondary | ICD-10-CM | POA: Diagnosis not present

## 2019-10-21 DIAGNOSIS — D689 Coagulation defect, unspecified: Secondary | ICD-10-CM | POA: Diagnosis not present

## 2019-10-21 NOTE — Telephone Encounter (Signed)
[facility] has not received monitor back from patient. They have attempted to contact the patient on 3 occasions to request monitor be returned to [facility].  CANCELLED ORDER 

## 2019-10-23 DIAGNOSIS — E1129 Type 2 diabetes mellitus with other diabetic kidney complication: Secondary | ICD-10-CM | POA: Diagnosis not present

## 2019-10-23 DIAGNOSIS — N2581 Secondary hyperparathyroidism of renal origin: Secondary | ICD-10-CM | POA: Diagnosis not present

## 2019-10-23 DIAGNOSIS — Z992 Dependence on renal dialysis: Secondary | ICD-10-CM | POA: Diagnosis not present

## 2019-10-23 DIAGNOSIS — D509 Iron deficiency anemia, unspecified: Secondary | ICD-10-CM | POA: Diagnosis not present

## 2019-10-23 DIAGNOSIS — D631 Anemia in chronic kidney disease: Secondary | ICD-10-CM | POA: Diagnosis not present

## 2019-10-23 DIAGNOSIS — N186 End stage renal disease: Secondary | ICD-10-CM | POA: Diagnosis not present

## 2019-10-23 DIAGNOSIS — D689 Coagulation defect, unspecified: Secondary | ICD-10-CM | POA: Diagnosis not present

## 2019-10-25 ENCOUNTER — Other Ambulatory Visit: Payer: Self-pay

## 2019-10-25 ENCOUNTER — Ambulatory Visit: Payer: Medicare Other | Admitting: Podiatry

## 2019-10-25 ENCOUNTER — Ambulatory Visit (INDEPENDENT_AMBULATORY_CARE_PROVIDER_SITE_OTHER): Payer: Medicare Other

## 2019-10-25 ENCOUNTER — Encounter: Payer: Self-pay | Admitting: Podiatry

## 2019-10-25 VITALS — Temp 98.7°F

## 2019-10-25 DIAGNOSIS — M869 Osteomyelitis, unspecified: Secondary | ICD-10-CM

## 2019-10-25 DIAGNOSIS — L97512 Non-pressure chronic ulcer of other part of right foot with fat layer exposed: Secondary | ICD-10-CM

## 2019-10-25 MED ORDER — SULFAMETHOXAZOLE-TRIMETHOPRIM 800-160 MG PO TABS: 1.0000 | ORAL_TABLET | Freq: Two times a day (BID) | ORAL | 0 refills | Status: DC

## 2019-10-25 NOTE — H&P (View-Only) (Signed)
HPI: 69 y.o. female presenting today as a reestablish patient.  She was last seen here in the office on 06/21/2016.  Patient states that approximately 3 weeks ago she noticed swelling to her right second toe.  She does have a history of bilateral great toe amputations.  She noticed a significant amount of pus and blood with drainage and a wound developing to the right second toe.  She denies trauma.  It was a gradual onset.  She has not done anything for treatment.  Past Medical History:  Diagnosis Date  . Anemia   . Anemia associated with chronic renal failure   . Anemia in chronic kidney disease 09/29/2018  . Arthritis   . Asthma   . Cataract    OD  . CKD stage 3 due to type 2 diabetes mellitus ([place])   . Coronary artery calcification seen on CAT scan 02/17/2018   Coronary calcification on CT  . Depression   . Diabetic retinopathy of both eyes ([place])   . Diabetic ulcer of right foot associated with type 2 diabetes mellitus ([place])   . Essential hypertension 02/17/2018   Essential hypertension  . GERD (gastroesophageal reflux disease)    pt denies  . HLD (hyperlipidemia)   . Hypertension   . Hypertensive retinopathy    OU  . Left ventricular dysfunction 04/07/2018   Left ventricle dysfunction  . Microalbuminuria due to type 2 diabetes mellitus ([place])   . Neuromuscular disorder ([place])    diabetic neuropathy  . Nonproliferative retinopathy due to secondary diabetes ([place])   . Nonsustained ventricular tachycardia ([place]) 08/28/2018   Nonsustained ventricular tachycardia  . Pneumonia    hx of   . Renal cell cancer ([place])   . Right renal mass 03/10/2017  . Type 2 diabetes, controlled, with neuropathy ([place]) 06/22/2013  . Ulcer of other part of foot 06/22/2013  . 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  Dermatology: Sausage digit toe with ulceration noted to the right second digit.   Ulcer noted to the dorsal aspect of the PIPJ with drainage.  Erythema noted localized to the toe and forefoot.  Moderate edema also noted.  Vascular: Palpable pedal pulses bilaterally.  Erythema and edema localized to the forefoot capillary refill within normal limits.  Neurological: Epicritic and protective threshold absent bilaterally.   Musculoskeletal Exam: History of bilateral great toe amputations  Radiographic Exam:  Osseous destruction with cortical irregularity noted throughout this second digit right foot consistent with osteomyelitis.  Absence of the great toe noted at the level of the MTPJ.  Assessment: 1.  Osteomyelitis right second digit 2.  Cellulitis right forefoot 3.  Diabetes mellitus type II   Plan of Care:  1. Patient evaluated. X-Rays reviewed.  2.  Patient understands that she needs surgery as soon as possible.  Patient agrees with surgical amputation of the right second toe.  All possible complications and details the procedure were explained.  No guarantees were expressed or implied. 3.  Authorization for surgery initiated today.  Surgery will consist of right second toe amputation.  Hopefully we will get the surgery scheduled within the week 4.  Cultures were taken and sent to pathology 5.  Prescription for Bactrim DS #20 6.  Postoperative shoe dispensed 7.  Dry sterile dressings applied.  Recommend Betadine daily to the area 8.  Return to clinic 1 week postop  *Goes  by Janann August, DPM [facility]  Dr. Edrick Kins, DPM    [address], [place] [address]                Office [phone]  Fax [phone]

## 2019-10-25 NOTE — Progress Notes (Signed)
HPI: 69 y.o. female presenting today as a reestablish patient.  She was last seen here in the office on 06/21/2016.  Patient states that approximately 3 weeks ago she noticed swelling to her right second toe.  She does have a history of bilateral great toe amputations.  She noticed a significant amount of pus and blood with drainage and a wound developing to the right second toe.  She denies trauma.  It was a gradual onset.  She has not done anything for treatment.  Past Medical History:  Diagnosis Date  . Anemia   . Anemia associated with chronic renal failure   . Anemia in chronic kidney disease 09/29/2018  . Arthritis   . Asthma   . Cataract    OD  . CKD stage 3 due to type 2 diabetes mellitus ([place])   . Coronary artery calcification seen on CAT scan 02/17/2018   Coronary calcification on CT  . Depression   . Diabetic retinopathy of both eyes ([place])   . Diabetic ulcer of right foot associated with type 2 diabetes mellitus ([place])   . Essential hypertension 02/17/2018   Essential hypertension  . GERD (gastroesophageal reflux disease)    pt denies  . HLD (hyperlipidemia)   . Hypertension   . Hypertensive retinopathy    OU  . Left ventricular dysfunction 04/07/2018   Left ventricle dysfunction  . Microalbuminuria due to type 2 diabetes mellitus ([place])   . Neuromuscular disorder ([place])    diabetic neuropathy  . Nonproliferative retinopathy due to secondary diabetes ([place])   . Nonsustained ventricular tachycardia ([place]) 08/28/2018   Nonsustained ventricular tachycardia  . Pneumonia    hx of   . Renal cell cancer ([place])   . Right renal mass 03/10/2017  . Type 2 diabetes, controlled, with neuropathy ([place]) 06/22/2013  . Ulcer of other part of foot 06/22/2013  . 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  Dermatology: Sausage digit toe with ulceration noted to the right second digit.   Ulcer noted to the dorsal aspect of the PIPJ with drainage.  Erythema noted localized to the toe and forefoot.  Moderate edema also noted.  Vascular: Palpable pedal pulses bilaterally.  Erythema and edema localized to the forefoot capillary refill within normal limits.  Neurological: Epicritic and protective threshold absent bilaterally.   Musculoskeletal Exam: History of bilateral great toe amputations  Radiographic Exam:  Osseous destruction with cortical irregularity noted throughout this second digit right foot consistent with osteomyelitis.  Absence of the great toe noted at the level of the MTPJ.  Assessment: 1.  Osteomyelitis right second digit 2.  Cellulitis right forefoot 3.  Diabetes mellitus type II   Plan of Care:  1. Patient evaluated. X-Rays reviewed.  2.  Patient understands that she needs surgery as soon as possible.  Patient agrees with surgical amputation of the right second toe.  All possible complications and details the procedure were explained.  No guarantees were expressed or implied. 3.  Authorization for surgery initiated today.  Surgery will consist of right second toe amputation.  Hopefully we will get the surgery scheduled within the week 4.  Cultures were taken and sent to pathology 5.  Prescription for Bactrim DS #20 6.  Postoperative shoe dispensed 7.  Dry sterile dressings applied.  Recommend Betadine daily to the area 8.  Return to clinic 1 week postop  *Goes  by [name], DPM [facility]  Dr. Edrick Kins, DPM    [address], [place] [address]                Office [phone]  Fax [phone]

## 2019-10-25 NOTE — Patient Instructions (Signed)
Pre-Operative Instructions  Congratulations, you have decided to take an important step towards improving your quality of life.  You can be assured that the doctors and staff at [facility] will be with you every step of the way.  Here are some important things you should know:  1. Plan to be at the surgery center/hospital at least 1 (one) hour prior to your scheduled time, unless otherwise directed by the surgical center/hospital staff.  You must have a responsible adult accompany you, remain during the surgery and drive you home.  Make sure you have directions to the surgical center/hospital to ensure you arrive on time. 2. If you are having surgery at [facility] or [facility] hospitals, you will need a copy of your medical history and physical form from your family physician within one month prior to the date of surgery. We will give you a form for your primary physician to complete.  3. We make every effort to accommodate the date you request for surgery.  However, there are times where surgery dates or times have to be moved.  We will contact you as soon as possible if a change in schedule is required.   4. No aspirin/ibuprofen for one week before surgery.  If you are on aspirin, any non-steroidal anti-inflammatory medications (Mobic, Aleve, Ibuprofen) should not be taken seven (7) days prior to your surgery.  You make take Tylenol for pain prior to surgery.  5. Medications - If you are taking daily heart and blood pressure medications, seizure, reflux, allergy, asthma, anxiety, pain or diabetes medications, make sure you notify the surgery center/hospital before the day of surgery so they can tell you which medications you should take or avoid the day of surgery. 6. No food or drink after midnight the night before surgery unless directed otherwise by surgical center/hospital staff. 7. No alcoholic beverages 24-hours prior to surgery.  No smoking 24-hours prior or 24-hours after  surgery. 8. Wear loose pants or shorts. They should be loose enough to fit over bandages, boots, and casts. 9. Don't wear slip-on shoes. Sneakers are preferred. 10. Bring your boot with you to the surgery center/hospital.  Also bring crutches or a walker if your physician has prescribed it for you.  If you do not have this equipment, it will be provided for you after surgery. 11. If you have not been contacted by the surgery center/hospital by the day before your surgery, call to confirm the date and time of your surgery. 12. Leave-time from work may vary depending on the type of surgery you have.  Appropriate arrangements should be made prior to surgery with your employer. 13. Prescriptions will be provided immediately following surgery by your doctor.  Fill these as soon as possible after surgery and take the medication as directed. Pain medications will not be refilled on weekends and must be approved by the doctor. 14. Remove nail polish on the operative foot and avoid getting pedicures prior to surgery. 15. Wash the night before surgery.  The night before surgery wash the foot and leg well with water and the antibacterial soap provided. Be sure to pay special attention to beneath the toenails and in between the toes.  Wash for at least three (3) minutes. Rinse thoroughly with water and dry well with a towel.  Perform this wash unless told not to do so by your physician.  Enclosed: 1 Ice pack (please put in freezer the night before surgery)   1 Hibiclens skin cleaner     Pre-op instructions  If you have any questions regarding the instructions, please do not hesitate to call our office.  [place]: [address], [place], [place] [address] -- [phone]  [place]: [address] [place], [place] [address] -- [phone]  [place]: [address], [place], [place] [address] -- [phone]   Website: [email or web address] 

## 2019-10-26 ENCOUNTER — Telehealth: Payer: Self-pay

## 2019-10-26 DIAGNOSIS — Z992 Dependence on renal dialysis: Secondary | ICD-10-CM | POA: Diagnosis not present

## 2019-10-26 DIAGNOSIS — E1129 Type 2 diabetes mellitus with other diabetic kidney complication: Secondary | ICD-10-CM | POA: Diagnosis not present

## 2019-10-26 DIAGNOSIS — N186 End stage renal disease: Secondary | ICD-10-CM | POA: Diagnosis not present

## 2019-10-26 DIAGNOSIS — D689 Coagulation defect, unspecified: Secondary | ICD-10-CM | POA: Diagnosis not present

## 2019-10-26 DIAGNOSIS — D631 Anemia in chronic kidney disease: Secondary | ICD-10-CM | POA: Diagnosis not present

## 2019-10-26 DIAGNOSIS — D509 Iron deficiency anemia, unspecified: Secondary | ICD-10-CM | POA: Diagnosis not present

## 2019-10-26 DIAGNOSIS — N2581 Secondary hyperparathyroidism of renal origin: Secondary | ICD-10-CM | POA: Diagnosis not present

## 2019-10-26 NOTE — Telephone Encounter (Signed)
DOS 10/29/2019   AMPUTATION TOE MPJ JOINT 2ND RT - 28820  UHC MEDICARE EFFECTIVE DATE - 07/23/2019  PLAN DEDUCTIBLE - $0.00  OUT OF POCKET - $4500 W/$4320.08 REMAINING COPAY $325 / Day OUTPATIENT SURGERY $325 / [place] 0% / Day OUTPATIENT SURGERY 0% / [place]  Notification or Prior Authorization is not required for the requested services  This [employer] Medicare Advantage members plan does not currently require a prior authorization for these services. If you have general questions about the prior authorization requirements, please call us at [phone] or visit [email or web address] > Clinician Resources > Advance and Admission Notification Requirements. The number above acknowledges your notification. Please write this number down for future reference. Notification is not a guarantee of coverage or payment.  Decision ID #:[id number]

## 2019-10-27 ENCOUNTER — Other Ambulatory Visit: Payer: Self-pay

## 2019-10-27 ENCOUNTER — Encounter (HOSPITAL_COMMUNITY): Payer: Self-pay | Admitting: Podiatry

## 2019-10-27 NOTE — Progress Notes (Signed)
LVMM for [name] ( Scheduling ) at DR Daylene Katayama in regards to [place] has left 3 voice mails for patient in regards to surgery and she has yet to return call.  Left call back number of [phone].

## 2019-10-28 ENCOUNTER — Other Ambulatory Visit (HOSPITAL_COMMUNITY)
Admission: RE | Admit: 2019-10-28 | Discharge: 2019-10-28 | Disposition: A | Discharge status: Home / Self Care | Payer: Medicare Other | Source: Ambulatory Visit | Attending: Podiatry | Admitting: Podiatry

## 2019-10-28 ENCOUNTER — Encounter (HOSPITAL_COMMUNITY): Payer: Self-pay | Admitting: Podiatry

## 2019-10-28 DIAGNOSIS — N2581 Secondary hyperparathyroidism of renal origin: Secondary | ICD-10-CM | POA: Diagnosis not present

## 2019-10-28 DIAGNOSIS — Z992 Dependence on renal dialysis: Secondary | ICD-10-CM | POA: Diagnosis not present

## 2019-10-28 DIAGNOSIS — N186 End stage renal disease: Secondary | ICD-10-CM | POA: Diagnosis not present

## 2019-10-28 DIAGNOSIS — Z20822 Contact with and (suspected) exposure to covid-19: Secondary | ICD-10-CM | POA: Insufficient documentation

## 2019-10-28 DIAGNOSIS — D631 Anemia in chronic kidney disease: Secondary | ICD-10-CM | POA: Diagnosis not present

## 2019-10-28 DIAGNOSIS — E1129 Type 2 diabetes mellitus with other diabetic kidney complication: Secondary | ICD-10-CM | POA: Diagnosis not present

## 2019-10-28 DIAGNOSIS — D689 Coagulation defect, unspecified: Secondary | ICD-10-CM | POA: Diagnosis not present

## 2019-10-28 DIAGNOSIS — D509 Iron deficiency anemia, unspecified: Secondary | ICD-10-CM | POA: Diagnosis not present

## 2019-10-28 DIAGNOSIS — Z01812 Encounter for preprocedural laboratory examination: Secondary | ICD-10-CM | POA: Diagnosis not present

## 2019-10-28 LAB — WOUND CULTURE
MICRO NUMBER:: 10327513
SPECIMEN QUALITY:: ADEQUATE

## 2019-10-28 LAB — SARS CORONAVIRUS 2 (TAT 6-24 HRS): SARS Coronavirus 2: NEGATIVE

## 2019-10-28 NOTE — Progress Notes (Addendum)
Spoke with patient and anesthesia PA, Konrad Felix.  Patient is a diabetic and patient reports glucose being low in the am at times.   .. Patient is not to take any diabetic medicaitons am of procedure.   Patient aware to eat a lgiht breakfast the am of surgery at 0800am per Janett Billow Zanetto,PA Light breakfast consists of piece of toast, egg and fruit.  Patient voiced understanding.  Patient is on a 32 ounce fluid restriction.  Patient instructed to follow renal diet and restrictions but she can have clear liquids until 1430pm day of surgery.  Patient voiced understanding.  .  Reviewed clear liquid diet with patient.  Patient will take no am meds morning of surgery except for Inhalers and she will bring those with her day of surgery.

## 2019-10-28 NOTE — Progress Notes (Signed)
Patient reports at time of preop op call sheis currently not taking any blood pressure medications due to blood pressure has been running low.  Patient stattes she does not take any heart medications.

## 2019-10-28 NOTE — Progress Notes (Signed)
Patient needs current H and P for surgery on 10/29/19.

## 2019-10-29 ENCOUNTER — Encounter (HOSPITAL_COMMUNITY): Payer: Self-pay | Admitting: Podiatry

## 2019-10-29 ENCOUNTER — Other Ambulatory Visit: Payer: Self-pay

## 2019-10-29 ENCOUNTER — Ambulatory Visit (HOSPITAL_COMMUNITY): Payer: Medicare Other | Admitting: Anesthesiology

## 2019-10-29 ENCOUNTER — Encounter (HOSPITAL_COMMUNITY)
Admission: RE | Disposition: A | Discharge status: Home / Self Care | Payer: Self-pay | Source: Ambulatory Visit | Attending: Podiatry

## 2019-10-29 ENCOUNTER — Ambulatory Visit (HOSPITAL_COMMUNITY)
Admission: RE | Admit: 2019-10-29 | Discharge: 2019-10-29 | Disposition: A | Discharge status: Home / Self Care | Payer: Medicare Other | Source: Ambulatory Visit | Attending: Podiatry | Admitting: Podiatry

## 2019-10-29 DIAGNOSIS — J45909 Unspecified asthma, uncomplicated: Secondary | ICD-10-CM | POA: Diagnosis not present

## 2019-10-29 DIAGNOSIS — E785 Hyperlipidemia, unspecified: Secondary | ICD-10-CM | POA: Diagnosis not present

## 2019-10-29 DIAGNOSIS — I2510 Atherosclerotic heart disease of native coronary artery without angina pectoris: Secondary | ICD-10-CM | POA: Insufficient documentation

## 2019-10-29 DIAGNOSIS — D631 Anemia in chronic kidney disease: Secondary | ICD-10-CM | POA: Diagnosis not present

## 2019-10-29 DIAGNOSIS — M86671 Other chronic osteomyelitis, right ankle and foot: Secondary | ICD-10-CM | POA: Diagnosis not present

## 2019-10-29 DIAGNOSIS — E113293 Type 2 diabetes mellitus with mild nonproliferative diabetic retinopathy without macular edema, bilateral: Secondary | ICD-10-CM | POA: Insufficient documentation

## 2019-10-29 DIAGNOSIS — E1122 Type 2 diabetes mellitus with diabetic chronic kidney disease: Secondary | ICD-10-CM | POA: Insufficient documentation

## 2019-10-29 DIAGNOSIS — I10 Essential (primary) hypertension: Secondary | ICD-10-CM | POA: Diagnosis not present

## 2019-10-29 DIAGNOSIS — L03115 Cellulitis of right lower limb: Secondary | ICD-10-CM | POA: Insufficient documentation

## 2019-10-29 DIAGNOSIS — Z85528 Personal history of other malignant neoplasm of kidney: Secondary | ICD-10-CM | POA: Diagnosis not present

## 2019-10-29 DIAGNOSIS — E1169 Type 2 diabetes mellitus with other specified complication: Secondary | ICD-10-CM | POA: Diagnosis not present

## 2019-10-29 DIAGNOSIS — M86171 Other acute osteomyelitis, right ankle and foot: Secondary | ICD-10-CM | POA: Diagnosis not present

## 2019-10-29 DIAGNOSIS — N183 Chronic kidney disease, stage 3 unspecified: Secondary | ICD-10-CM | POA: Insufficient documentation

## 2019-10-29 DIAGNOSIS — M869 Osteomyelitis, unspecified: Secondary | ICD-10-CM | POA: Diagnosis not present

## 2019-10-29 DIAGNOSIS — E1140 Type 2 diabetes mellitus with diabetic neuropathy, unspecified: Secondary | ICD-10-CM | POA: Insufficient documentation

## 2019-10-29 HISTORY — PX: AMPUTATION TOE: SHX6595

## 2019-10-29 LAB — CBC
HCT: 30.9 % — ABNORMAL LOW (ref 36.0–46.0)
Hemoglobin: 9.8 g/dL — ABNORMAL LOW (ref 12.0–15.0)
MCH: 31.9 pg (ref 26.0–34.0)
MCHC: 31.7 g/dL (ref 30.0–36.0)
MCV: 100.7 fL — ABNORMAL HIGH (ref 80.0–100.0)
Platelets: 189 10*3/uL (ref 150–400)
RBC: 3.07 MIL/uL — ABNORMAL LOW (ref 3.87–5.11)
RDW: 14.1 % (ref 11.5–15.5)
WBC: 6.4 10*3/uL (ref 4.0–10.5)
nRBC: 0 % (ref 0.0–0.2)

## 2019-10-29 LAB — BASIC METABOLIC PANEL
Anion gap: 15 (ref 5–15)
BUN: 18 mg/dL (ref 8–23)
CO2: 26 mmol/L (ref 22–32)
Calcium: 8.5 mg/dL — ABNORMAL LOW (ref 8.9–10.3)
Chloride: 97 mmol/L — ABNORMAL LOW (ref 98–111)
Creatinine, Ser: 4.66 mg/dL — ABNORMAL HIGH (ref 0.44–1.00)
GFR calc Af Amer: 10 mL/min — ABNORMAL LOW (ref >60)
GFR calc non Af Amer: 9 mL/min — ABNORMAL LOW (ref >60)
Glucose, Bld: 140 mg/dL — ABNORMAL HIGH (ref 70–99)
Potassium: 3.9 mmol/L (ref 3.5–5.1)
Sodium: 138 mmol/L (ref 135–145)

## 2019-10-29 LAB — GLUCOSE, CAPILLARY
Glucose-Capillary: 128 mg/dL — ABNORMAL HIGH (ref 70–99)
Glucose-Capillary: 115 mg/dL — ABNORMAL HIGH (ref 70–99)

## 2019-10-29 SURGERY — AMPUTATION, TOE
Anesthesia: Monitor Anesthesia Care | Site: Toe | Laterality: Right

## 2019-10-29 MED ORDER — PROPOFOL 500 MG/50ML IV EMUL
INTRAVENOUS | Status: DC | PRN
  Administered 2019-10-29: 100 ug/kg/min via INTRAVENOUS

## 2019-10-29 MED ORDER — PROPOFOL 1000 MG/100ML IV EMUL
INTRAVENOUS | Status: AC
  Filled 2019-10-29: qty 100

## 2019-10-29 MED ORDER — PHENYLEPHRINE HCL-NACL 10-0.9 MG/250ML-% IV SOLN
INTRAVENOUS | Status: DC | PRN
  Administered 2019-10-29: 35 ug/min via INTRAVENOUS

## 2019-10-29 MED ORDER — LACTATED RINGERS IV SOLN: INTRAVENOUS | Status: DC

## 2019-10-29 MED ORDER — MIDAZOLAM HCL 5 MG/5ML IJ SOLN
INTRAMUSCULAR | Status: DC | PRN
  Administered 2019-10-29: 1 mg via INTRAVENOUS

## 2019-10-29 MED ORDER — MIDAZOLAM HCL 2 MG/2ML IJ SOLN
INTRAMUSCULAR | Status: AC
  Filled 2019-10-29: qty 2

## 2019-10-29 MED ORDER — PROPOFOL 10 MG/ML IV BOLUS
INTRAVENOUS | Status: DC | PRN
  Administered 2019-10-29: 20 mg via INTRAVENOUS

## 2019-10-29 MED ORDER — FENTANYL CITRATE (PF) 100 MCG/2ML IJ SOLN
INTRAMUSCULAR | Status: AC
  Filled 2019-10-29: qty 2

## 2019-10-29 MED ORDER — CEFAZOLIN SODIUM-DEXTROSE 2-3 GM-%(50ML) IV SOLR
INTRAVENOUS | Status: DC | PRN
  Administered 2019-10-29: 2 g via INTRAVENOUS

## 2019-10-29 MED ORDER — ONDANSETRON HCL 4 MG/2ML IJ SOLN
INTRAMUSCULAR | Status: DC | PRN
  Administered 2019-10-29: 4 mg via INTRAVENOUS

## 2019-10-29 MED ORDER — FENTANYL CITRATE (PF) 100 MCG/2ML IJ SOLN: 25.0000 ug | INTRAMUSCULAR | Status: DC | PRN

## 2019-10-29 MED ORDER — SODIUM CHLORIDE 0.9 % IV SOLN: INTRAVENOUS | Status: DC

## 2019-10-29 MED ORDER — PROMETHAZINE HCL 25 MG/ML IJ SOLN: 6.2500 mg | INTRAMUSCULAR | Status: DC | PRN

## 2019-10-29 MED ORDER — LIDOCAINE HCL (CARDIAC) PF 100 MG/5ML IV SOSY
PREFILLED_SYRINGE | INTRAVENOUS | Status: DC | PRN
  Administered 2019-10-29: 50 mg via INTRAVENOUS

## 2019-10-29 MED ORDER — CEFAZOLIN SODIUM-DEXTROSE 2-4 GM/100ML-% IV SOLN
INTRAVENOUS | Status: AC
  Filled 2019-10-29: qty 100

## 2019-10-29 MED ORDER — BUPIVACAINE HCL (PF) 0.5 % IJ SOLN
INTRAMUSCULAR | Status: AC
  Filled 2019-10-29: qty 30

## 2019-10-29 MED ORDER — FENTANYL CITRATE (PF) 100 MCG/2ML IJ SOLN
INTRAMUSCULAR | Status: DC | PRN
  Administered 2019-10-29: 50 ug via INTRAVENOUS

## 2019-10-29 MED ORDER — OXYCODONE-ACETAMINOPHEN 5-325 MG PO TABS: 1.0000 | ORAL_TABLET | Freq: Four times a day (QID) | ORAL | 0 refills | Status: DC | PRN

## 2019-10-29 MED ORDER — BUPIVACAINE HCL (PF) 0.5 % IJ SOLN
INTRAMUSCULAR | Status: DC | PRN
  Administered 2019-10-29: 15 mL

## 2019-10-29 MED ORDER — ONDANSETRON HCL 4 MG/2ML IJ SOLN
INTRAMUSCULAR | Status: AC
  Filled 2019-10-29: qty 2

## 2019-10-29 MED ORDER — DEXAMETHASONE SODIUM PHOSPHATE 10 MG/ML IJ SOLN
INTRAMUSCULAR | Status: AC
  Filled 2019-10-29: qty 1

## 2019-10-29 SURGICAL SUPPLY — 30 items
APL PRP STRL LF DISP 70% ISPRP (MISCELLANEOUS) ×1
BAG SPEC THK2 15X12 ZIP CLS (MISCELLANEOUS) ×1
BAG ZIPLOCK 12X15 (MISCELLANEOUS) ×2 IMPLANT
BANDAGE ESMARK 6X9 LF (GAUZE/BANDAGES/DRESSINGS) ×1 IMPLANT
BNDG CMPR 9X6 STRL LF SNTH (GAUZE/BANDAGES/DRESSINGS) ×1
BNDG COHESIVE 4X5 TAN STRL (GAUZE/BANDAGES/DRESSINGS) ×2 IMPLANT
BNDG CONFORM 2 STRL LF (GAUZE/BANDAGES/DRESSINGS) ×2 IMPLANT
BNDG ELASTIC 4X5.8 VLCR STR LF (GAUZE/BANDAGES/DRESSINGS) ×1 IMPLANT
BNDG ELASTIC 6X5.8 VLCR STR LF (GAUZE/BANDAGES/DRESSINGS) ×1 IMPLANT
BNDG ESMARK 6X9 LF (GAUZE/BANDAGES/DRESSINGS) ×2
CHLORAPREP W/TINT 26 (MISCELLANEOUS) ×2 IMPLANT
COVER SURGICAL LIGHT HANDLE (MISCELLANEOUS) ×2 IMPLANT
CUFF TOURN SGL QUICK 18X4 (TOURNIQUET CUFF) ×2 IMPLANT
GAUZE SPONGE 4X4 12PLY STRL (GAUZE/BANDAGES/DRESSINGS) ×2 IMPLANT
GAUZE XEROFORM 1X8 LF (GAUZE/BANDAGES/DRESSINGS) ×2 IMPLANT
GLOVE BIOGEL PI IND STRL 8 (GLOVE) ×1 IMPLANT
GLOVE BIOGEL PI INDICATOR 8 (GLOVE) ×1
GLOVE ECLIPSE 8.0 STRL XLNG CF (GLOVE) ×2 IMPLANT
GOWN STRL REUS W/TWL LRG LVL3 (GOWN DISPOSABLE) ×2 IMPLANT
KIT BASIN OR (CUSTOM PROCEDURE TRAY) ×2 IMPLANT
KIT TURNOVER KIT A (KITS) IMPLANT
NDL HYPO 25X1 1.5 SAFETY (NEEDLE) ×1 IMPLANT
NEEDLE HYPO 25X1 1.5 SAFETY (NEEDLE) ×2 IMPLANT
NS IRRIG 1000ML POUR BTL (IV SOLUTION) ×2 IMPLANT
PACK ORTHO EXTREMITY (CUSTOM PROCEDURE TRAY) ×2 IMPLANT
PENCIL SMOKE EVACUATOR (MISCELLANEOUS) IMPLANT
PROTECTOR NERVE ULNAR (MISCELLANEOUS) ×2 IMPLANT
SUT ETHILON 4 0 PS 2 18 (SUTURE) ×4 IMPLANT
SUT PROLENE 4 0 PS 2 18 (SUTURE) ×1 IMPLANT
TOWEL OR 17X26 10 PK STRL BLUE (TOWEL DISPOSABLE) ×2 IMPLANT

## 2019-10-29 NOTE — Anesthesia Postprocedure Evaluation (Signed)
Anesthesia Post Note  Patient: [name]  Procedure(s) Performed: RIGHT 2ND TOE AMPUTATION (Right Toe)     Patient location during evaluation: PACU Anesthesia Type: MAC Level of consciousness: awake and alert Pain management: pain level controlled Vital Signs Assessment: post-procedure vital signs reviewed and stable Respiratory status: spontaneous breathing and respiratory function stable Cardiovascular status: stable Postop Assessment: no apparent nausea or vomiting Anesthetic complications: no    Last Vitals:  Vitals:   10/29/19 1915 10/29/19 1930  BP: 103/77 105/70  Pulse: 77 74  Resp: (!) 25 16  Temp:    SpO2: 93% (!) 88%    Last Pain:  Vitals:   10/29/19 1900  TempSrc:   PainSc: 0-No pain                 Stony Stegmann DANIEL

## 2019-10-29 NOTE — Anesthesia Procedure Notes (Signed)
Procedure Name: MAC Date/Time: 10/29/2019 5:56 PM Performed by: Lissa Morales, CRNA Pre-anesthesia Checklist: Patient identified, Emergency Drugs available, Patient being monitored, Suction available and Timeout performed Patient Re-evaluated:Patient Re-evaluated prior to induction Oxygen Delivery Method: Simple face mask Placement Confirmation: positive ETCO2

## 2019-10-29 NOTE — Transfer of Care (Signed)
Immediate Anesthesia Transfer of Care Note  Patient: [name]  Procedure(s) Performed: RIGHT 2ND TOE AMPUTATION (Right Toe)  Patient Location: PACU  Anesthesia Type:MAC  Level of Consciousness: awake, alert , oriented and patient cooperative  Airway & Oxygen Therapy: Patient Spontanous Breathing and Patient connected to face mask oxygen  Post-op Assessment: Report given to RN, Post -op Vital signs reviewed and stable and Patient moving all extremities X 4  Post vital signs: stable  Last Vitals:  Vitals Value Taken Time  BP 116/59 10/29/19 1855  Temp    Pulse 72 10/29/19 1858  Resp 22 10/29/19 1858  SpO2 100 % 10/29/19 1858  Vitals shown include unvalidated device data.  Last Pain:  Vitals:   10/29/19 1533  TempSrc:   PainSc: 3       Patients Stated Pain Goal: 3 (84/85/92 7639)  Complications: No apparent anesthesia complications

## 2019-10-29 NOTE — Discharge Instructions (Signed)
General Anesthesia, Adult, Care After This sheet gives you information about how to care for yourself after your procedure. Your health care provider may also give you more specific instructions. If you have problems or questions, contact your health care provider. What can I expect after the procedure? After the procedure, the following side effects are common:  Pain or discomfort at the IV site.  Nausea.  Vomiting.  Sore throat.  Trouble concentrating.  Feeling cold or chills.  Weak or tired.  Sleepiness and fatigue.  Soreness and body aches. These side effects can affect parts of the body that were not involved in surgery. Follow these instructions at home:  For at least 24 hours after the procedure:  Have a responsible adult stay with you. It is important to have someone help care for you until you are awake and alert.  Rest as needed.  Do not: ? Participate in activities in which you could fall or become injured. ? Drive. ? Use heavy machinery. ? Drink alcohol. ? Take sleeping pills or medicines that cause drowsiness. ? Make important decisions or sign legal documents. ? Take care of children on your own. Eating and drinking  Follow any instructions from your health care provider about eating or drinking restrictions.  When you feel hungry, start by eating small amounts of foods that are soft and easy to digest (bland), such as toast. Gradually return to your regular diet.  Drink enough fluid to keep your urine pale yellow.  If you vomit, rehydrate by drinking water, juice, or clear broth. General instructions  If you have sleep apnea, surgery and certain medicines can increase your risk for breathing problems. Follow instructions from your health care provider about wearing your sleep device: ? Anytime you are sleeping, including during daytime naps. ? While taking prescription pain medicines, sleeping medicines, or medicines that make you drowsy.  Return to  your normal activities as told by your health care provider. Ask your health care provider what activities are safe for you.  Take over-the-counter and prescription medicines only as told by your health care provider.  If you smoke, do not smoke without supervision.  Keep all follow-up visits as told by your health care provider. This is important. Contact a health care provider if:  You have nausea or vomiting that does not get better with medicine.  You cannot eat or drink without vomiting.  You have pain that does not get better with medicine.  You are unable to pass urine.  You develop a skin rash.  You have a fever.  You have redness around your IV site that gets worse. Get help right away if:  You have difficulty breathing.  You have chest pain.  You have blood in your urine or stool, or you vomit blood. Summary  After the procedure, it is common to have a sore throat or nausea. It is also common to feel tired.  Have a responsible adult stay with you for the first 24 hours after general anesthesia. It is important to have someone help care for you until you are awake and alert.  When you feel hungry, start by eating small amounts of foods that are soft and easy to digest (bland), such as toast. Gradually return to your regular diet.  Drink enough fluid to keep your urine pale yellow.  Return to your normal activities as told by your health care provider. Ask your health care provider what activities are safe for you. This information is not   intended to replace advice given to you by your health care provider. Make sure you discuss any questions you have with your health care provider. Document Revised: 07/11/2017 Document Reviewed: 02/21/2017 Elsevier Patient Education  2020 Elsevier Inc.  

## 2019-10-29 NOTE — Brief Op Note (Signed)
10/29/2019  6:51 PM  PATIENT:  [name]  69 y.o. female  PRE-OPERATIVE DIAGNOSIS:  OSTEOMYLITIS OF 2ND TOE RIGHT FOOT  POST-OPERATIVE DIAGNOSIS:  OSTEOMYLITIS OF 2ND TOE RIGHT FOOT  PROCEDURE:  Procedure(s): RIGHT 2ND TOE AMPUTATION (Right)  SURGEON:  Surgeon(s) and Role:    Edrick Kins, DPM - Primary  PHYSICIAN ASSISTANT:   ASSISTANTS: none   ANESTHESIA:   local and MAC  EBL:  5 mL   BLOOD ADMINISTERED:none  DRAINS: none   LOCAL MEDICATIONS USED:  MARCAINE     SPECIMEN:  Source of Specimen:  cultures  DISPOSITION OF SPECIMEN:  PATHOLOGY  COUNTS:  YES  TOURNIQUET:  * Missing tourniquet times found for documented tourniquets in log: [id number] *  DICTATION: .Viviann Spare Dictation  PLAN OF CARE: Discharge to home after PACU  PATIENT DISPOSITION:  PACU - hemodynamically stable.   Delay start of Pharmacological VTE agent (>24hrs) due to surgical blood loss or risk of bleeding: not applicable

## 2019-10-29 NOTE — Anesthesia Preprocedure Evaluation (Addendum)
Anesthesia Evaluation  Patient identified by MRN, date of birth, ID band Patient awake    Reviewed: Allergy & Precautions, NPO status , Patient's Chart, lab work & pertinent test results  History of Anesthesia Complications Negative for: history of anesthetic complications  Airway Mallampati: I  TM Distance: >3 FB     Dental  (+) Missing, Dental Advisory Given   Pulmonary asthma ,    Pulmonary exam normal        Cardiovascular hypertension, + CAD  Normal cardiovascular exam  Study Conclusions 02/2018  - Procedure narrative: Transthoracic echocardiography. Image  quality was poor. The study was technically difficult.  Intravenous contrast (Definity) was administered to opacify the  LV.  - Left ventricle: The cavity size was normal. Wall thickness was  increased in a pattern of mild LVH. The estimated ejection  fraction was 40%. Diffuse hypokinesis. Doppler parameters are  consistent with abnormal left ventricular relaxation (grade 1  diastolic dysfunction).  - Aortic valve: There was no stenosis.  - Aorta: Ascending aortic diameter: 37 mm (S).  - Ascending aorta: The ascending aorta was borderline dilated.  - Mitral valve: Mildly calcified annulus. There was no significant  regurgitation.  - Left atrium: The atrium was mildly dilated.  - Right ventricle: The cavity size was normal. Systolic function  was normal.  - Right atrium: The atrium was mildly dilated.  - Pulmonary arteries: No complete TR doppler jet so unable to  estimate PA systolic pressure.  - Inferior vena cava: The vessel was normal in size. The  respirophasic diameter changes were in the normal range (>= 50%),  consistent with normal central venous pressure.  - Pericardium, extracardiac: A trivial pericardial effusion was  identified posterior to the heart.   Study Highlights 02/2018    The left ventricular ejection fraction  is moderately decreased (30-44%).  Nuclear stress EF: 43%.  There was no ST segment deviation noted during stress.  This is a low risk study.   Abnormal, low risk stress nuclear study with no ischemia or infarction.  Gated ejection fraction 43% with diffuse hypokinesis.  However LV function appears better than calculated ejection fraction.  Moderate left ventricular enlargement.  Suggest echocardiogram to further assess.      Neuro/Psych PSYCHIATRIC DISORDERS Depression negative neurological ROS     GI/Hepatic Neg liver ROS, GERD  ,  Endo/Other  diabetes  Renal/GU Renal disease     Musculoskeletal negative musculoskeletal ROS (+)   Abdominal   Peds  Hematology negative hematology ROS (+)   Anesthesia Other Findings Day of surgery medications reviewed with the patient.  Reproductive/Obstetrics                           Anesthesia Physical Anesthesia Plan  ASA: III  Anesthesia Plan: MAC   Post-op Pain Management:    Induction: Intravenous  PONV Risk Score and Plan: 2 and Ondansetron and Propofol infusion  Airway Management Planned: Natural Airway  Additional Equipment:   Intra-op Plan:   Post-operative Plan:   Informed Consent: I have reviewed the patients History and Physical, chart, labs and discussed the procedure including the risks, benefits and alternatives for the proposed anesthesia with the patient or authorized representative who has indicated his/her understanding and acceptance.     Dental advisory given  Plan Discussed with: Anesthesiologist and CRNA  Anesthesia Plan Comments:        Anesthesia Quick Evaluation

## 2019-10-29 NOTE — Interval H&P Note (Signed)
History and Physical Interval Note:  10/29/2019 5:51 PM  [name]  has presented today for surgery, with the diagnosis of OSTEOMYLITIS OF 2ND TOE RIGHT FOOT.  The various methods of treatment have been discussed with the patient and family. After consideration of risks, benefits and other options for treatment, the patient has consented to  Procedure(s): RIGHT 2ND TOE AMPUTATION (Right) as a surgical intervention.  The patient's history has been reviewed, patient examined, no change in status, stable for surgery.  I have reviewed the patient's chart and labs.  Questions were answered to the patient's satisfaction.     Edrick Kins

## 2019-10-29 NOTE — Op Note (Signed)
OPERATIVE REPORT Patient name: [name] MRN: [id number] DOB: 1951-06-01  DOS: 10/29/2019  Preop Dx: Osteomyelitis right second toe Postop Dx: same  Procedure:  1.  Right second toe amputation  Surgeon: Edrick Kins DPM  Anesthesia: 0.5% Marcaine plain totaling 15 mL infiltrated in the patient's right lower extremity  Hemostasis: None  EBL: 5 mL Materials: None Injectables: None Pathology: Deep wound cultures taken and sent to pathology for culture and sensitivity  Condition: The patient tolerated the procedure and anesthesia well. No complications noted or reported   Justification for procedure: The patient is a 69 y.o. female with history of diabetes mellitus type 2-uncontrolled and history of right great toe amputation who presents today for surgical correction of acute osteomyelitis of the right second toe. All conservative modalities of been unsuccessful in providing any sort of satisfactory alleviation of symptoms with the patient. The patient was told benefits as well as possible side effects of the surgery. The patient consented for surgical correction. The patient consent form was reviewed. All patient questions were answered. No guarantees were expressed or implied. The patient and the surgeon boson the patient consent form with the witness present and placed in the patient's chart.   Procedure in Detail: The patient was brought to the operating room, placed in the operating table in the supine position at which time an aseptic scrub and drape were performed about the patient's respective lower extremity after anesthesia was induced as described above. Attention was then directed to the surgical area where procedure number one commenced.  Procedure #1: Right second toe amputation A racquet type incision was planned and made about the second MTPJ of the right foot.  Incision was carried down to the level of joint capsule with care taken to cut clamp ligate and retract  well small neurovascular structures traversing the incision site.  The second toe was grasped with a perforating towel clamp and distracted and surgical scalpel was utilized to free the surrounding capsular tissue.  The toe was removed in toto.  Deep wound cultures were taken and sent to pathology.  Irrigation was utilized in preparation for primary closure.  3-0 Prolene suture was utilized to reapproximate superficial skin edges for primary closure.  Dry sterile compressive dressings were then applied to all previously mentioned incision sites about the patient's lower extremity. The tourniquet which was used for hemostasis was deflated. All normal neurovascular responses including pink color and warmth returned all the digits of patient's lower extremity.  The patient was then transferred from the operating room to the recovery room having tolerated the procedure and anesthesia well. All vital signs are stable. After a brief stay in the recovery room the patient was discharged.  The patient is neuropathic and does not need pain medication.  The patient is also currently on oral doxycycline 2 times daily.  Continue.  Verbal as well as written instructions were provided for the patient regarding wound care. The patient is to keep the dressings clean dry and intact until they are to follow surgeon Dr. Daylene Katayama in the office upon discharge.   Edrick Kins, DPM [facility]  Dr. Edrick Kins, [address]                                        [place], [place] [address]  Office [phone]  Fax [phone]

## 2019-10-30 DIAGNOSIS — E1129 Type 2 diabetes mellitus with other diabetic kidney complication: Secondary | ICD-10-CM | POA: Diagnosis not present

## 2019-10-30 DIAGNOSIS — Z992 Dependence on renal dialysis: Secondary | ICD-10-CM | POA: Diagnosis not present

## 2019-10-30 DIAGNOSIS — D631 Anemia in chronic kidney disease: Secondary | ICD-10-CM | POA: Diagnosis not present

## 2019-10-30 DIAGNOSIS — D689 Coagulation defect, unspecified: Secondary | ICD-10-CM | POA: Diagnosis not present

## 2019-10-30 DIAGNOSIS — N2581 Secondary hyperparathyroidism of renal origin: Secondary | ICD-10-CM | POA: Diagnosis not present

## 2019-10-30 DIAGNOSIS — D509 Iron deficiency anemia, unspecified: Secondary | ICD-10-CM | POA: Diagnosis not present

## 2019-10-30 DIAGNOSIS — N186 End stage renal disease: Secondary | ICD-10-CM | POA: Diagnosis not present

## 2019-10-30 LAB — HEMOGLOBIN A1C
Hgb A1c MFr Bld: 6.9 % — ABNORMAL HIGH (ref 4.8–5.6)
Mean Plasma Glucose: 151 mg/dL

## 2019-11-01 DIAGNOSIS — H25011 Cortical age-related cataract, right eye: Secondary | ICD-10-CM | POA: Diagnosis not present

## 2019-11-01 DIAGNOSIS — Z961 Presence of intraocular lens: Secondary | ICD-10-CM | POA: Diagnosis not present

## 2019-11-01 DIAGNOSIS — H25041 Posterior subcapsular polar age-related cataract, right eye: Secondary | ICD-10-CM | POA: Diagnosis not present

## 2019-11-01 DIAGNOSIS — H2511 Age-related nuclear cataract, right eye: Secondary | ICD-10-CM | POA: Diagnosis not present

## 2019-11-02 ENCOUNTER — Telehealth: Payer: Self-pay | Admitting: Podiatry

## 2019-11-02 DIAGNOSIS — Z992 Dependence on renal dialysis: Secondary | ICD-10-CM | POA: Diagnosis not present

## 2019-11-02 DIAGNOSIS — N186 End stage renal disease: Secondary | ICD-10-CM | POA: Diagnosis not present

## 2019-11-02 DIAGNOSIS — D631 Anemia in chronic kidney disease: Secondary | ICD-10-CM | POA: Diagnosis not present

## 2019-11-02 DIAGNOSIS — D689 Coagulation defect, unspecified: Secondary | ICD-10-CM | POA: Diagnosis not present

## 2019-11-02 DIAGNOSIS — N2581 Secondary hyperparathyroidism of renal origin: Secondary | ICD-10-CM | POA: Diagnosis not present

## 2019-11-02 DIAGNOSIS — E1129 Type 2 diabetes mellitus with other diabetic kidney complication: Secondary | ICD-10-CM | POA: Diagnosis not present

## 2019-11-02 DIAGNOSIS — D509 Iron deficiency anemia, unspecified: Secondary | ICD-10-CM | POA: Diagnosis not present

## 2019-11-02 NOTE — Telephone Encounter (Signed)
Pt to come to office surgical shoe.

## 2019-11-02 NOTE — Telephone Encounter (Signed)
pts sister left message on my voicemail stating pt had surgery on Friday with Dr Amalia Hailey and the hospital could not find a shoe for pt. They did find one in the ed but it was not pts size and it trips pt. Can they send someone in to pick up a shoe for the pt. She is currently on wearing the bandage and a blue slip on  Surgical sock it sounded like.   Spoke to pts sister and pt wears a size 10 shoe and will pick up tomorrow.Marland KitchenSummer Ronnald Ramp)

## 2019-11-03 ENCOUNTER — Other Ambulatory Visit: Payer: Medicare Other

## 2019-11-03 ENCOUNTER — Other Ambulatory Visit: Payer: Self-pay

## 2019-11-03 DIAGNOSIS — I10 Essential (primary) hypertension: Secondary | ICD-10-CM

## 2019-11-03 DIAGNOSIS — E119 Type 2 diabetes mellitus without complications: Secondary | ICD-10-CM | POA: Diagnosis not present

## 2019-11-03 DIAGNOSIS — E785 Hyperlipidemia, unspecified: Secondary | ICD-10-CM

## 2019-11-03 DIAGNOSIS — N185 Chronic kidney disease, stage 5: Secondary | ICD-10-CM

## 2019-11-03 DIAGNOSIS — Z794 Long term (current) use of insulin: Secondary | ICD-10-CM | POA: Diagnosis not present

## 2019-11-03 DIAGNOSIS — D631 Anemia in chronic kidney disease: Secondary | ICD-10-CM

## 2019-11-04 DIAGNOSIS — Z992 Dependence on renal dialysis: Secondary | ICD-10-CM | POA: Diagnosis not present

## 2019-11-04 DIAGNOSIS — N186 End stage renal disease: Secondary | ICD-10-CM | POA: Diagnosis not present

## 2019-11-04 DIAGNOSIS — D689 Coagulation defect, unspecified: Secondary | ICD-10-CM | POA: Diagnosis not present

## 2019-11-04 DIAGNOSIS — D631 Anemia in chronic kidney disease: Secondary | ICD-10-CM | POA: Diagnosis not present

## 2019-11-04 DIAGNOSIS — N2581 Secondary hyperparathyroidism of renal origin: Secondary | ICD-10-CM | POA: Diagnosis not present

## 2019-11-04 DIAGNOSIS — E1129 Type 2 diabetes mellitus with other diabetic kidney complication: Secondary | ICD-10-CM | POA: Diagnosis not present

## 2019-11-04 DIAGNOSIS — D509 Iron deficiency anemia, unspecified: Secondary | ICD-10-CM | POA: Diagnosis not present

## 2019-11-04 LAB — CBC WITH DIFFERENTIAL/PLATELET
Absolute Monocytes: 281 cells/uL (ref 200–950)
Basophils Absolute: 31 cells/uL (ref 0–200)
Basophils Relative: 0.6 %
Eosinophils Absolute: 109 cells/uL (ref 15–500)
Eosinophils Relative: 2.1 %
HCT: 27.6 % — ABNORMAL LOW (ref 35.0–45.0)
Hemoglobin: 9 g/dL — ABNORMAL LOW (ref 11.7–15.5)
Lymphs Abs: 1030 cells/uL (ref 850–3900)
MCH: 31.4 pg (ref 27.0–33.0)
MCHC: 32.6 g/dL (ref 32.0–36.0)
MCV: 96.2 fL (ref 80.0–100.0)
MPV: 10.4 fL (ref 7.5–12.5)
Monocytes Relative: 5.4 %
Neutro Abs: 3749 cells/uL (ref 1500–7800)
Neutrophils Relative %: 72.1 %
Platelets: 205 10*3/uL (ref 140–400)
RBC: 2.87 10*6/uL — ABNORMAL LOW (ref 3.80–5.10)
RDW: 14.3 % (ref 11.0–15.0)
Total Lymphocyte: 19.8 %
WBC: 5.2 10*3/uL (ref 3.8–10.8)

## 2019-11-04 LAB — COMPREHENSIVE METABOLIC PANEL
AG Ratio: 1.1 (calc) (ref 1.0–2.5)
ALT: 3 U/L — ABNORMAL LOW (ref 6–29)
AST: 14 U/L (ref 10–35)
Albumin: 3.3 g/dL — ABNORMAL LOW (ref 3.6–5.1)
Alkaline phosphatase (APISO): 55 U/L (ref 37–153)
BUN/Creatinine Ratio: 4 (calc) — ABNORMAL LOW (ref 6–22)
BUN: 16 mg/dL (ref 7–25)
CO2: 31 mmol/L (ref 20–32)
Calcium: 8.1 mg/dL — ABNORMAL LOW (ref 8.6–10.4)
Chloride: 98 mmol/L (ref 98–110)
Creat: 4 mg/dL — ABNORMAL HIGH (ref 0.50–0.99)
Globulin: 2.9 g/dL (calc) (ref 1.9–3.7)
Glucose, Bld: 126 mg/dL — ABNORMAL HIGH (ref 65–99)
Potassium: 3.8 mmol/L (ref 3.5–5.3)
Sodium: 141 mmol/L (ref 135–146)
Total Bilirubin: 0.5 mg/dL (ref 0.2–1.2)
Total Protein: 6.2 g/dL (ref 6.1–8.1)

## 2019-11-04 LAB — AEROBIC/ANAEROBIC CULTURE W GRAM STAIN (SURGICAL/DEEP WOUND): Gram Stain: NONE SEEN

## 2019-11-04 LAB — LIPID PANEL
Cholesterol: 92 mg/dL (ref <200)
HDL: 27 mg/dL — ABNORMAL LOW (ref >=50)
LDL Cholesterol (Calc): 43 mg/dL (calc)
Non-HDL Cholesterol (Calc): 65 mg/dL (calc) (ref <130)
Total CHOL/HDL Ratio: 3.4 (calc) (ref <5.0)
Triglycerides: 134 mg/dL (ref <150)

## 2019-11-08 ENCOUNTER — Emergency Department (HOSPITAL_COMMUNITY): Payer: Medicare Other

## 2019-11-08 ENCOUNTER — Other Ambulatory Visit: Payer: Self-pay

## 2019-11-08 ENCOUNTER — Encounter (HOSPITAL_COMMUNITY): Payer: Self-pay

## 2019-11-08 ENCOUNTER — Encounter: Payer: Medicare Other | Admitting: Podiatry

## 2019-11-08 ENCOUNTER — Ambulatory Visit: Payer: Medicare Other | Admitting: Family Medicine

## 2019-11-08 ENCOUNTER — Emergency Department (HOSPITAL_COMMUNITY)
Admission: EM | Admit: 2019-11-08 | Discharge: 2019-11-08 | Disposition: A | Discharge status: Home / Self Care | Payer: Medicare Other | Attending: Emergency Medicine | Admitting: Emergency Medicine

## 2019-11-08 DIAGNOSIS — N183 Chronic kidney disease, stage 3 unspecified: Secondary | ICD-10-CM | POA: Diagnosis not present

## 2019-11-08 DIAGNOSIS — Z794 Long term (current) use of insulin: Secondary | ICD-10-CM | POA: Diagnosis not present

## 2019-11-08 DIAGNOSIS — M545 Low back pain: Secondary | ICD-10-CM | POA: Insufficient documentation

## 2019-11-08 DIAGNOSIS — K5641 Fecal impaction: Secondary | ICD-10-CM | POA: Diagnosis not present

## 2019-11-08 DIAGNOSIS — Z79899 Other long term (current) drug therapy: Secondary | ICD-10-CM | POA: Diagnosis not present

## 2019-11-08 DIAGNOSIS — R1032 Left lower quadrant pain: Secondary | ICD-10-CM | POA: Diagnosis not present

## 2019-11-08 DIAGNOSIS — E1122 Type 2 diabetes mellitus with diabetic chronic kidney disease: Secondary | ICD-10-CM | POA: Diagnosis not present

## 2019-11-08 DIAGNOSIS — J45909 Unspecified asthma, uncomplicated: Secondary | ICD-10-CM | POA: Diagnosis not present

## 2019-11-08 DIAGNOSIS — E1140 Type 2 diabetes mellitus with diabetic neuropathy, unspecified: Secondary | ICD-10-CM | POA: Insufficient documentation

## 2019-11-08 DIAGNOSIS — I129 Hypertensive chronic kidney disease with stage 1 through stage 4 chronic kidney disease, or unspecified chronic kidney disease: Secondary | ICD-10-CM | POA: Insufficient documentation

## 2019-11-08 DIAGNOSIS — R000 Tachycardia, unspecified: Secondary | ICD-10-CM | POA: Diagnosis not present

## 2019-11-08 DIAGNOSIS — R1031 Right lower quadrant pain: Secondary | ICD-10-CM | POA: Diagnosis present

## 2019-11-08 DIAGNOSIS — Z85528 Personal history of other malignant neoplasm of kidney: Secondary | ICD-10-CM | POA: Diagnosis not present

## 2019-11-08 DIAGNOSIS — K5900 Constipation, unspecified: Secondary | ICD-10-CM | POA: Diagnosis not present

## 2019-11-08 LAB — CBC WITH DIFFERENTIAL/PLATELET
Abs Immature Granulocytes: 0.05 10*3/uL (ref 0.00–0.07)
Basophils Absolute: 0 10*3/uL (ref 0.0–0.1)
Basophils Relative: 0 %
Eosinophils Absolute: 0 10*3/uL (ref 0.0–0.5)
Eosinophils Relative: 1 %
HCT: 28 % — ABNORMAL LOW (ref 36.0–46.0)
Hemoglobin: 8.9 g/dL — ABNORMAL LOW (ref 12.0–15.0)
Immature Granulocytes: 1 %
Lymphocytes Relative: 10 %
Lymphs Abs: 0.9 10*3/uL (ref 0.7–4.0)
MCH: 32.2 pg (ref 26.0–34.0)
MCHC: 31.8 g/dL (ref 30.0–36.0)
MCV: 101.4 fL — ABNORMAL HIGH (ref 80.0–100.0)
Monocytes Absolute: 0.5 10*3/uL (ref 0.1–1.0)
Monocytes Relative: 6 %
Neutro Abs: 7.2 10*3/uL (ref 1.7–7.7)
Neutrophils Relative %: 82 %
Platelets: 179 10*3/uL (ref 150–400)
RBC: 2.76 MIL/uL — ABNORMAL LOW (ref 3.87–5.11)
RDW: 15.5 % (ref 11.5–15.5)
WBC: 8.6 10*3/uL (ref 4.0–10.5)
nRBC: 0 % (ref 0.0–0.2)

## 2019-11-08 LAB — COMPREHENSIVE METABOLIC PANEL
ALT: 9 U/L (ref 0–44)
AST: 15 U/L (ref 15–41)
Albumin: 3 g/dL — ABNORMAL LOW (ref 3.5–5.0)
Alkaline Phosphatase: 59 U/L (ref 38–126)
Anion gap: 16 — ABNORMAL HIGH (ref 5–15)
BUN: 46 mg/dL — ABNORMAL HIGH (ref 8–23)
CO2: 26 mmol/L (ref 22–32)
Calcium: 8.2 mg/dL — ABNORMAL LOW (ref 8.9–10.3)
Chloride: 98 mmol/L (ref 98–111)
Creatinine, Ser: 7.38 mg/dL — ABNORMAL HIGH (ref 0.44–1.00)
GFR calc Af Amer: 6 mL/min — ABNORMAL LOW (ref >60)
GFR calc non Af Amer: 5 mL/min — ABNORMAL LOW (ref >60)
Glucose, Bld: 192 mg/dL — ABNORMAL HIGH (ref 70–99)
Potassium: 3.8 mmol/L (ref 3.5–5.1)
Sodium: 140 mmol/L (ref 135–145)
Total Bilirubin: 0.9 mg/dL (ref 0.3–1.2)
Total Protein: 7.1 g/dL (ref 6.5–8.1)

## 2019-11-08 LAB — MAGNESIUM: Magnesium: 1.9 mg/dL (ref 1.7–2.4)

## 2019-11-08 LAB — PHOSPHORUS: Phosphorus: 4.4 mg/dL (ref 2.5–4.6)

## 2019-11-08 MED ORDER — BISACODYL 10 MG RE SUPP
10.0000 mg | Freq: Once | RECTAL | Status: AC
  Administered 2019-11-08: 10 mg via RECTAL
  Filled 2019-11-08: qty 1

## 2019-11-08 MED ORDER — MINERAL OIL RE ENEM
1.0000 | ENEMA | Freq: Once | RECTAL | Status: AC
  Administered 2019-11-08: 1 via RECTAL
  Filled 2019-11-08: qty 1

## 2019-11-08 MED ORDER — MAGNESIUM CITRATE PO SOLN: 1.0000 | Freq: Once | ORAL | 0 refills | Status: AC

## 2019-11-08 MED ORDER — IOHEXOL 9 MG/ML PO SOLN
500.0000 mL | ORAL | Status: AC
  Administered 2019-11-08 (×2): 500 mL via ORAL

## 2019-11-08 NOTE — Discharge Instructions (Addendum)
You are seen in the ER for abdominal discomfort.  You are noted to have fecal impaction. We had manually disimpacted you in the ER.  Please start taking the medication prescribed and follow-up with your primary care doctor.  Continue taking Dulcolax.  Return to the ER if your symptoms get worse, you are unable to pass stool and start having nausea, vomiting.  Also, he will be started noticing liquid stools coming out.

## 2019-11-08 NOTE — ED Provider Notes (Addendum)
[facility] DEPT Provider Note   CSN: [id number] Arrival date & time: 11/08/19  3267     History Chief Complaint  Patient presents with  . Constipation    [name] is a 69 y.o. female.  HPI     69 year old female with history of ESRD on HD, diabetes, hypertension, hyperlipidemia and status post nephrectomy for cancer comes in a chief complaint of abdominal pain.  Patient reports that [name] has been having issues with constipation for the last several weeks.  However, over the last week her symptoms have worsened.  [name] has not had a good bowel movement in more than a week now and [name] is having worsening abdominal pain.  The abdominal pain is located primarily in the lower quadrants and also in the lower back.  [name] denies any vomiting but is nauseated.  No fevers, chills.  No history of similar symptoms in the past.  Past Medical History:  Diagnosis Date  . Anemia   . Anemia associated with chronic renal failure   . Anemia in chronic kidney disease 09/29/2018  . Arthritis   . Asthma   . Cataract    OD  . CKD stage 3 due to type 2 diabetes mellitus ([place])   . Coronary artery calcification seen on CAT scan 02/17/2018   Coronary calcification on CT  . Depression   . Diabetic retinopathy of both eyes ([place])   . Diabetic ulcer of right foot associated with type 2 diabetes mellitus ([place])   . Essential hypertension 02/17/2018   Essential hypertension  . GERD (gastroesophageal reflux disease)    pt denies  . HLD (hyperlipidemia)   . Hypertension   . Hypertensive retinopathy    OU  . Left ventricular dysfunction 04/07/2018   Left ventricle dysfunction  . Microalbuminuria due to type 2 diabetes mellitus ([place])   . Neuromuscular disorder ([place])    diabetic neuropathy  . Nonproliferative retinopathy due to secondary diabetes ([place])   . Nonsustained ventricular tachycardia ([place]) 08/28/2018   Nonsustained ventricular tachycardia  . Pneumonia    hx of   .  Renal cell cancer ([place])   . Right renal mass 03/10/2017  . Type 2 diabetes, controlled, with neuropathy ([place]) 06/22/2013  . Ulcer of other part of foot 06/22/2013  . 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 [facility])     Patient Active Problem List   Diagnosis Date Noted  . Anemia in chronic kidney disease 09/29/2018  . Nonsustained ventricular tachycardia ([place]) 08/28/2018  . Nonproliferative retinopathy due to secondary diabetes ([place])   . Left ventricular dysfunction 04/07/2018  . Essential hypertension 02/17/2018  . Coronary artery calcification seen on CAT scan 02/17/2018  . Diabetic retinopathy of both eyes ([place])   . Hyperlipidemia 10/01/2017  . Encounter for long-term (current) use of medications 10/01/2017  . Right renal mass 03/10/2017  . Diabetic ulcer of right foot associated with type 2 diabetes mellitus ([place])   . Type 2 diabetes, controlled, with neuropathy ([place]) 06/22/2013  . Ulcer of other part of foot 06/22/2013    Past Surgical History:  Procedure Laterality Date  . AMPUTATION TOE Right 10/29/2019   Procedure: RIGHT 2ND TOE AMPUTATION;  Surgeon: Edrick Kins, DPM;  Location: [facility];  Service: Podiatry;  Laterality: Right;  . BASCILIC VEIN TRANSPOSITION Left 08/08/2017   Procedure: BASILIC VEIN TRANSPOSITION FIRST STAGE LEFT ARM;  Surgeon: Serafina Mitchell, MD;  Location: [facility];  Service: Vascular;  Laterality: Left;  . BASCILIC  VEIN TRANSPOSITION Left 05/14/2018   Procedure: SECOND STAGE BASILIC VEIN TRANSPOSITION LEFT ARM;  Surgeon: Serafina Mitchell, MD;  Location: [facility];  Service: Vascular;  Laterality: Left;  . CATARACT EXTRACTION Left   . EYE SURGERY    . ROBOTIC ADRENALECTOMY Left 03/10/2017   Procedure: XI ROBOTIC ADRENALECTOMY;  Surgeon: Nickie Retort, MD;  Location: [facility];  Service: Urology;  Laterality: Left;  . ROBOTIC ASSITED PARTIAL NEPHRECTOMY Right 03/10/2017   Procedure: XI ROBOTIC ASSITED RADICAL NEPHRECTOMY;  Surgeon: Nickie Retort, MD;  Location: [facility];  Service: Urology;  Laterality: Right;  . TOE AMPUTATION Bilateral    both great toe ,, left foot 2nd toe 1/2     OB History   No obstetric history on file.     Family History  Problem Relation Age of Onset  . Heart disease Mother   . Diabetes Sister     Social History   Tobacco Use  . Smoking status: Never Smoker  . Smokeless tobacco: Never Used  Substance Use Topics  . Alcohol use: Yes    Alcohol/week: 0.0 standard drinks    Comment: occ  . Drug use: No    Home Medications Prior to Admission medications   Medication Sig Start Date End Date Taking? Authorizing Provider  bisacodyl (BISACODYL) 5 MG EC tablet Take 10 mg by mouth daily as needed for moderate constipation.    [provider]  Blood Glucose Monitoring Suppl (ONE TOUCH ULTRA 2) w/Device KIT Use to check BS BID-QID Dx:E11.9 05/19/17   [name], MD  BREO ELLIPTA 100-25 MCG/INH AEPB Inhale 1 puff by mouth once daily Patient taking differently: Inhale 1 puff into the lungs daily.  05/21/19   [name], MD  Carboxymethylcellulose Sodium (THERATEARS OP) Place 1 drop into both eyes daily as needed (dry eyes).    [provider]  diphenhydrAMINE (BENADRYL) 25 MG tablet Take 25 mg by mouth daily as needed for allergies.    [provider]  gabapentin (NEURONTIN) 300 MG capsule TAKE 1 CAPSULE BY MOUTH THREE TIMES DAILY Patient taking differently: Take 300 mg by mouth 3 (three) times daily as needed (leg pain).  09/30/19   [name], MD  Insulin Pen Needle (LIVE BETTER PEN NEEDLES) 31G X 6 MM MISC To use with Tresiba pens daily 02/05/19   [name], MD  Lancets [facility] ULTRASOFT) lancets Use as instructed 05/19/17   [name], MD  lidocaine-prilocaine (EMLA) cream Apply 1 application topically as needed (port access).    [provider]  magnesium citrate SOLN Take 296 mLs (1 Bottle total) by mouth once for 1 dose.  11/08/19 11/08/19  Varney Biles, MD  multivitamin (RENA-VIT) TABS tablet Take 1 tablet by mouth daily.    [provider]  [facility] ULTRA test strip USE AS DIRECTED TO MONITOR  FSBS 3 TIMES DAILY 05/05/19   [name], MD  oxyCODONE-acetaminophen (PERCOCET) 5-325 MG tablet Take 1 tablet by mouth every 6 (six) hours as needed for severe pain. 10/29/19   Edrick Kins, DPM  PROAIR HFA 108 [id number] Base) MCG/ACT inhaler Inhale 2 puffs into the lungs every 6 (six) hours as needed for wheezing or shortness of breath. 11/30/18   [name], MD  rosuvastatin (CRESTOR) 20 MG tablet Take 1 tablet by mouth once daily Patient taking differently: Take 20 mg by mouth daily.  07/13/19   [name], MD  sevelamer carbonate (RENVELA) 800  MG tablet Take 800 mg by mouth 2 (two) times daily with a meal.    [provider]  sulfamethoxazole-trimethoprim (BACTRIM DS) 800-160 MG tablet Take 1 tablet by mouth 2 (two) times daily. 10/25/19   Amalia Hailey, Dorathy Daft, DPM  TRESIBA FLEXTOUCH 100 UNIT/ML FlexTouch Pen INJECT 150 UNITS SUBCUTANEOUSLY ONCE DAILY Patient taking differently: Inject 50-150 Units into the skin daily as needed (high blood over 150).  10/13/19   [name], MD    Allergies    Penicillins and Adhesive [tape]  Review of Systems   Review of Systems  Constitutional: Positive for activity change.  Respiratory: Negative for shortness of breath.   Cardiovascular: Negative for chest pain.  Gastrointestinal: Positive for abdominal pain and nausea. Negative for vomiting.  All other systems reviewed and are negative.   Physical Exam Updated Vital Signs BP (!) 133/59   Pulse 75   Temp 98.7 F (37.1 C) (Oral)   Resp 16   Ht 5' 7"  (1.702 m)   Wt 96.9 kg   SpO2 100%   BMI 33.45 kg/m   Physical Exam Vitals and nursing note reviewed.  Constitutional:      Appearance: [name] is well-developed.  HENT:     Head: Normocephalic and atraumatic.  Eyes:     Pupils: Pupils  are equal, round, and reactive to light.  Cardiovascular:     Rate and Rhythm: Normal rate and regular rhythm.     Heart sounds: Normal heart sounds. No murmur.  Pulmonary:     Effort: Pulmonary effort is normal. No respiratory distress.  Abdominal:     General: There is no distension.     Palpations: Abdomen is soft.     Tenderness: There is abdominal tenderness. There is no guarding or rebound.     Comments: Lower quadrant abdominal tenderness without rebound or guarding  Musculoskeletal:     Cervical back: Neck supple.  Skin:    General: Skin is warm and dry.  Neurological:     Mental Status: [name] is alert and oriented to person, place, and time.     ED Results / Procedures / Treatments   Labs (all labs ordered are listed, but only abnormal results are displayed) Labs Reviewed  COMPREHENSIVE METABOLIC PANEL - Abnormal; Notable for the following components:      Result Value   Glucose, Bld 192 (*)    BUN 46 (*)    Creatinine, Ser 7.38 (*)    Calcium 8.2 (*)    Albumin 3.0 (*)    GFR calc non Af Amer 5 (*)    GFR calc Af Amer 6 (*)    Anion gap 16 (*)    All other components within normal limits  CBC WITH DIFFERENTIAL/PLATELET - Abnormal; Notable for the following components:   RBC 2.76 (*)    Hemoglobin 8.9 (*)    HCT 28.0 (*)    MCV 101.4 (*)    All other components within normal limits  MAGNESIUM  PHOSPHORUS  URINALYSIS, ROUTINE W REFLEX MICROSCOPIC    EKG None   Date: 11/08/2019  Rate: 78  Rhythm: normal sinus rhythm  QRS Axis: normal  Intervals: normal  ST/T Wave abnormalities: normal  Conduction Disutrbances: none  Narrative Interpretation: unremarkable   Radiology CT ABDOMEN PELVIS WO CONTRAST  Result Date: 11/08/2019 CLINICAL DATA:  Rectal pain, constipation EXAM: CT ABDOMEN AND PELVIS WITHOUT CONTRAST TECHNIQUE: Multidetector CT imaging of the abdomen and pelvis was performed following the standard protocol without IV contrast.  COMPARISON:   05/13/2019 FINDINGS: Lower chest: No acute abnormality. Hepatobiliary: No focal liver abnormality is seen. No gallstones, gallbladder wall thickening, or biliary dilatation. Pancreas: Unremarkable. No pancreatic ductal dilatation or surrounding inflammatory changes. Spleen: Normal in size without focal abnormality. Adrenals/Urinary Tract: Normal adrenal glands. Normal left kidney. Prior right nephrectomy. Mild left perinephric stranding. Normal bladder. Stomach/Bowel: Stomach is within normal limits. Appendix appears normal. No evidence of bowel wall thickening, distention, or inflammatory changes. Large amount of stool within the rectum with mild rectal wall thickening and mild presacral edema. Vascular/Lymphatic: Normal caliber abdominal aorta with mild atherosclerosis. Extensive atherosclerotic changes. No lymphadenopathy. Reproductive: Normal uterus. 2.7 cm hypodense, fluid attenuating left adnexal mass likely reflecting an ovarian cyst. Other: No abdominal wall hernia or abnormality. No abdominopelvic ascites. Musculoskeletal: No acute osseous abnormality. No aggressive osseous lesion. Generalized osteopenia. Facet arthropathy throughout the thoracolumbar spine. Osteoarthritis of bilateral SI joints. IMPRESSION: 1. Rectal fecal impaction. 2. 2.7 cm hypodense left adnexal mass likely reflecting a left ovarian cyst. Appearance is similar to the prior examination of 10/31/2017. 3. Aortic Atherosclerosis (ICD10-I70.0). Electronically Signed   By: Kathreen Devoid   On: 11/08/2019 11:25    Procedures Fecal disimpaction  Date/Time: 11/08/2019 1:38 PM Performed by: Varney Biles, MD Authorized by: Varney Biles, MD  Consent: Verbal consent obtained. Consent given by: patient Patient understanding: patient states understanding of the procedure being performed Patient identity confirmed: arm band Time out: Immediately prior to procedure a "time out" was called to verify the correct patient, procedure,  equipment, support staff and site/side marked as required. Local anesthesia used: no  Anesthesia: Local anesthesia used: no  Sedation: Patient sedated: no     (including critical care time)  Medications Ordered in ED Medications  bisacodyl (DULCOLAX) suppository 10 mg (has no administration in time range)  iohexol (OMNIPAQUE) 9 MG/ML oral solution 500 mL (500 mLs Oral Contrast Given 11/08/19 1030)  mineral oil enema 1 enema (1 enema Rectal Given 11/08/19 1358)    ED Course  I have reviewed the triage vital signs and the nursing notes.  Pertinent labs & imaging results that were available during my care of the patient were reviewed by me and considered in my medical decision making (see chart for details).  Clinical Course as of Nov 07 1456  Mon Nov 08, 2019  1438 Impaction has been removed manually.  Enema ordered   [AN]  1457 patient had slight stool production with enema. [name] feels a lot better though.  [name] is comfortable going home.  We will add mag citrate to her regimen and have her follow-up with PCP. Strict ER return precautions have been discussed with the patient.   [AN]    Clinical Course User Index [AN] Varney Biles, MD   MDM Rules/Calculators/A&P                      69 year old female comes in a chief complaint of abdominal pain.  [name] is having abdominal pain that has gotten more constant over the last few weeks.  [name] is also having severe constipation which is new.  [name] suspects that some of the constipation could be attributed to her starting binders because of her renal failure back in November.  [name] is status post nephrectomy, no other abdominal surgeries.  Patient has not had any colonoscopies.  [name] also reports missed dialysis x2.  On exam there is no peritoneal findings.  Differential diagnosis includes worsening constipation, ileus.  Tumors and infection such  as diverticulitis also possible as the pain is worse in the left lower quadrant.  We will  initiate basic lab work and get a CT scan without contrast. [name] has missed 2 sessions of dialysis so we will also evaluate for emergent dialysis.  Final Clinical Impression(s) / ED Diagnoses Final diagnoses:  Fecal impaction in rectum [facility])    Rx / DC Orders ED Discharge Orders         Ordered    magnesium citrate SOLN   Once     11/08/19 1455              Varney Biles, MD 11/08/19 1458

## 2019-11-08 NOTE — ED Notes (Signed)
Patient done with oral contrast.

## 2019-11-08 NOTE — ED Triage Notes (Signed)
Patient states she has not had a "complete BM" in a week. Patient states she has been taking Dulcolax.  Patient states she has been having small amounts of liquid, but no BM.

## 2019-11-08 NOTE — ED Notes (Signed)
Patient transported to CT 

## 2019-11-08 NOTE — ED Notes (Signed)
Pt. is unable to void; will continue to monitor

## 2019-11-09 DIAGNOSIS — N186 End stage renal disease: Secondary | ICD-10-CM | POA: Diagnosis not present

## 2019-11-09 DIAGNOSIS — E1129 Type 2 diabetes mellitus with other diabetic kidney complication: Secondary | ICD-10-CM | POA: Diagnosis not present

## 2019-11-09 DIAGNOSIS — D509 Iron deficiency anemia, unspecified: Secondary | ICD-10-CM | POA: Diagnosis not present

## 2019-11-09 DIAGNOSIS — N2581 Secondary hyperparathyroidism of renal origin: Secondary | ICD-10-CM | POA: Diagnosis not present

## 2019-11-09 DIAGNOSIS — D631 Anemia in chronic kidney disease: Secondary | ICD-10-CM | POA: Diagnosis not present

## 2019-11-09 DIAGNOSIS — Z992 Dependence on renal dialysis: Secondary | ICD-10-CM | POA: Diagnosis not present

## 2019-11-09 DIAGNOSIS — D689 Coagulation defect, unspecified: Secondary | ICD-10-CM | POA: Diagnosis not present

## 2019-11-11 DIAGNOSIS — D509 Iron deficiency anemia, unspecified: Secondary | ICD-10-CM | POA: Diagnosis not present

## 2019-11-11 DIAGNOSIS — E1129 Type 2 diabetes mellitus with other diabetic kidney complication: Secondary | ICD-10-CM | POA: Diagnosis not present

## 2019-11-11 DIAGNOSIS — N2581 Secondary hyperparathyroidism of renal origin: Secondary | ICD-10-CM | POA: Diagnosis not present

## 2019-11-11 DIAGNOSIS — Z992 Dependence on renal dialysis: Secondary | ICD-10-CM | POA: Diagnosis not present

## 2019-11-11 DIAGNOSIS — D631 Anemia in chronic kidney disease: Secondary | ICD-10-CM | POA: Diagnosis not present

## 2019-11-11 DIAGNOSIS — D689 Coagulation defect, unspecified: Secondary | ICD-10-CM | POA: Diagnosis not present

## 2019-11-11 DIAGNOSIS — N186 End stage renal disease: Secondary | ICD-10-CM | POA: Diagnosis not present

## 2019-11-13 DIAGNOSIS — N186 End stage renal disease: Secondary | ICD-10-CM | POA: Diagnosis not present

## 2019-11-13 DIAGNOSIS — D689 Coagulation defect, unspecified: Secondary | ICD-10-CM | POA: Diagnosis not present

## 2019-11-13 DIAGNOSIS — D509 Iron deficiency anemia, unspecified: Secondary | ICD-10-CM | POA: Diagnosis not present

## 2019-11-13 DIAGNOSIS — D631 Anemia in chronic kidney disease: Secondary | ICD-10-CM | POA: Diagnosis not present

## 2019-11-13 DIAGNOSIS — E1129 Type 2 diabetes mellitus with other diabetic kidney complication: Secondary | ICD-10-CM | POA: Diagnosis not present

## 2019-11-13 DIAGNOSIS — Z992 Dependence on renal dialysis: Secondary | ICD-10-CM | POA: Diagnosis not present

## 2019-11-13 DIAGNOSIS — N2581 Secondary hyperparathyroidism of renal origin: Secondary | ICD-10-CM | POA: Diagnosis not present

## 2019-11-15 ENCOUNTER — Ambulatory Visit (INDEPENDENT_AMBULATORY_CARE_PROVIDER_SITE_OTHER): Payer: Medicare Other

## 2019-11-15 ENCOUNTER — Telehealth: Payer: Self-pay | Admitting: *Deleted

## 2019-11-15 ENCOUNTER — Other Ambulatory Visit: Payer: Self-pay

## 2019-11-15 ENCOUNTER — Ambulatory Visit (INDEPENDENT_AMBULATORY_CARE_PROVIDER_SITE_OTHER): Payer: Medicare Other | Admitting: Podiatry

## 2019-11-15 DIAGNOSIS — M869 Osteomyelitis, unspecified: Secondary | ICD-10-CM

## 2019-11-15 DIAGNOSIS — Z9889 Other specified postprocedural states: Secondary | ICD-10-CM

## 2019-11-15 DIAGNOSIS — R0989 Other specified symptoms and signs involving the circulatory and respiratory systems: Secondary | ICD-10-CM

## 2019-11-15 MED ORDER — SULFAMETHOXAZOLE-TRIMETHOPRIM 800-160 MG PO TABS: 1.0000 | ORAL_TABLET | Freq: Two times a day (BID) | ORAL | 0 refills | Status: DC

## 2019-11-15 NOTE — Telephone Encounter (Signed)
-----   Message from Edrick Kins, DPM sent at 11/15/2019  1:20 PM EDT ----- Regarding: arterial doppler RLE Please order arterial Doppler right lower extremity.  Not urgent but hopefully we can get it done within the next week or so.   Dx: PVD RLE.   Thanks, Dr. Amalia Hailey  #Note dictated

## 2019-11-15 NOTE — Progress Notes (Signed)
Subjective:  Patient presents today status post right second toe amputation. DOS: 10/29/2019.  Patient states that she thinks she is doing pretty good.  She has no new complaints at this time.  Patient denies pain.  She goes to dialysis three times per week.  Past Medical History:  Diagnosis Date  . Anemia   . Anemia associated with chronic renal failure   . Anemia in chronic kidney disease 09/29/2018  . Arthritis   . Asthma   . Cataract    OD  . CKD stage 3 due to type 2 diabetes mellitus ([place])   . Coronary artery calcification seen on CAT scan 02/17/2018   Coronary calcification on CT  . Depression   . Diabetic retinopathy of both eyes ([place])   . Diabetic ulcer of right foot associated with type 2 diabetes mellitus ([place])   . Essential hypertension 02/17/2018   Essential hypertension  . GERD (gastroesophageal reflux disease)    pt denies  . HLD (hyperlipidemia)   . Hypertension   . Hypertensive retinopathy    OU  . Left ventricular dysfunction 04/07/2018   Left ventricle dysfunction  . Microalbuminuria due to type 2 diabetes mellitus ([place])   . Neuromuscular disorder ([place])    diabetic neuropathy  . Nonproliferative retinopathy due to secondary diabetes ([place])   . Nonsustained ventricular tachycardia ([place]) 08/28/2018   Nonsustained ventricular tachycardia  . Pneumonia    hx of   . Renal cell cancer ([place])   . Right renal mass 03/10/2017  . Type 2 diabetes, controlled, with neuropathy ([place]) 06/22/2013  . Ulcer of other part of foot 06/22/2013  . 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 (HCC)       Objective/Physical Exam Derm: There is some dehiscence along the amputation site as evidenced on the picture above.  Periwound appears stable.  Amputation site is granular with minimal serosanguineous drainage.  There is no malodor noted.  Moderate erythema and edema around the surgical forefoot  Neurovascular: Epicritic and protective threshold absent bilateral lower  extremities  Vascular: Today the pedal pulses are diminished and I am unable to palpate.  Capillary refill is normal within digits 3-5.  We will order arterial Doppler to make sure the patient is getting blood flow for healing.  Musculoskeletal: Absence of the first and second digits right foot.  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  Radiographic Exam:  Absence of the first and second digits right foot at the level of the MTPJ.  There is degenerative bone changes to the first and second metatarsals.  These appear unchanged from last visit and do not appear to be associated with osteomyelitis.  We will do routine x-rays to ensure stability of the metatarsals  Assessment: 1. s/p second toe amputation right foot. DOS: 10/29/2019 2.  Diabetes mellitus type 2-uncontrolled 3.  CKD stage III secondary to type 2 diabetes mellitus  Plan of Care:  1. Patient was evaluated. X-rays reviewed 2.  Sutures were removed today and debridement of the amputation site was performed using a tissue nipper.  Dry sterile dressings were applied. 3.  Today we will place orders for an arterial Doppler to ensure the patient has adequate blood flow 4.  Refill prescription for Bactrim DS #20  5.  Return to clinic in 10 days 6.  Continue dialysis 3x/week.    Edrick Kins, DPM [facility]  Dr. Edrick Kins, DPM    [address]  [place], [place] [address]                Office [phone]  Fax [phone]

## 2019-11-15 NOTE — Telephone Encounter (Signed)
Orders faxed to [facility].

## 2019-11-16 DIAGNOSIS — Z992 Dependence on renal dialysis: Secondary | ICD-10-CM | POA: Diagnosis not present

## 2019-11-16 DIAGNOSIS — E1129 Type 2 diabetes mellitus with other diabetic kidney complication: Secondary | ICD-10-CM | POA: Diagnosis not present

## 2019-11-16 DIAGNOSIS — D689 Coagulation defect, unspecified: Secondary | ICD-10-CM | POA: Diagnosis not present

## 2019-11-16 DIAGNOSIS — D509 Iron deficiency anemia, unspecified: Secondary | ICD-10-CM | POA: Diagnosis not present

## 2019-11-16 DIAGNOSIS — N186 End stage renal disease: Secondary | ICD-10-CM | POA: Diagnosis not present

## 2019-11-16 DIAGNOSIS — N2581 Secondary hyperparathyroidism of renal origin: Secondary | ICD-10-CM | POA: Diagnosis not present

## 2019-11-16 DIAGNOSIS — D631 Anemia in chronic kidney disease: Secondary | ICD-10-CM | POA: Diagnosis not present

## 2019-11-18 DIAGNOSIS — N2581 Secondary hyperparathyroidism of renal origin: Secondary | ICD-10-CM | POA: Diagnosis not present

## 2019-11-18 DIAGNOSIS — D631 Anemia in chronic kidney disease: Secondary | ICD-10-CM | POA: Diagnosis not present

## 2019-11-18 DIAGNOSIS — Z992 Dependence on renal dialysis: Secondary | ICD-10-CM | POA: Diagnosis not present

## 2019-11-18 DIAGNOSIS — D689 Coagulation defect, unspecified: Secondary | ICD-10-CM | POA: Diagnosis not present

## 2019-11-18 DIAGNOSIS — E1129 Type 2 diabetes mellitus with other diabetic kidney complication: Secondary | ICD-10-CM | POA: Diagnosis not present

## 2019-11-18 DIAGNOSIS — D509 Iron deficiency anemia, unspecified: Secondary | ICD-10-CM | POA: Diagnosis not present

## 2019-11-18 DIAGNOSIS — N186 End stage renal disease: Secondary | ICD-10-CM | POA: Diagnosis not present

## 2019-11-19 DIAGNOSIS — N186 End stage renal disease: Secondary | ICD-10-CM | POA: Diagnosis not present

## 2019-11-19 DIAGNOSIS — I129 Hypertensive chronic kidney disease with stage 1 through stage 4 chronic kidney disease, or unspecified chronic kidney disease: Secondary | ICD-10-CM | POA: Diagnosis not present

## 2019-11-19 DIAGNOSIS — Z992 Dependence on renal dialysis: Secondary | ICD-10-CM | POA: Diagnosis not present

## 2019-11-20 DIAGNOSIS — D509 Iron deficiency anemia, unspecified: Secondary | ICD-10-CM | POA: Diagnosis not present

## 2019-11-20 DIAGNOSIS — N186 End stage renal disease: Secondary | ICD-10-CM | POA: Diagnosis not present

## 2019-11-20 DIAGNOSIS — N2581 Secondary hyperparathyroidism of renal origin: Secondary | ICD-10-CM | POA: Diagnosis not present

## 2019-11-20 DIAGNOSIS — D689 Coagulation defect, unspecified: Secondary | ICD-10-CM | POA: Diagnosis not present

## 2019-11-20 DIAGNOSIS — Z992 Dependence on renal dialysis: Secondary | ICD-10-CM | POA: Diagnosis not present

## 2019-11-20 DIAGNOSIS — D631 Anemia in chronic kidney disease: Secondary | ICD-10-CM | POA: Diagnosis not present

## 2019-11-23 ENCOUNTER — Other Ambulatory Visit: Payer: Self-pay | Admitting: Family Medicine

## 2019-11-23 DIAGNOSIS — D509 Iron deficiency anemia, unspecified: Secondary | ICD-10-CM | POA: Diagnosis not present

## 2019-11-23 DIAGNOSIS — Z992 Dependence on renal dialysis: Secondary | ICD-10-CM | POA: Diagnosis not present

## 2019-11-23 DIAGNOSIS — N186 End stage renal disease: Secondary | ICD-10-CM | POA: Diagnosis not present

## 2019-11-23 DIAGNOSIS — N2581 Secondary hyperparathyroidism of renal origin: Secondary | ICD-10-CM | POA: Diagnosis not present

## 2019-11-23 DIAGNOSIS — D689 Coagulation defect, unspecified: Secondary | ICD-10-CM | POA: Diagnosis not present

## 2019-11-23 DIAGNOSIS — D631 Anemia in chronic kidney disease: Secondary | ICD-10-CM | POA: Diagnosis not present

## 2019-11-24 ENCOUNTER — Other Ambulatory Visit: Payer: Self-pay

## 2019-11-24 ENCOUNTER — Ambulatory Visit (INDEPENDENT_AMBULATORY_CARE_PROVIDER_SITE_OTHER): Payer: Medicare Other | Admitting: Podiatry

## 2019-11-24 DIAGNOSIS — R0989 Other specified symptoms and signs involving the circulatory and respiratory systems: Secondary | ICD-10-CM

## 2019-11-24 DIAGNOSIS — Z9889 Other specified postprocedural states: Secondary | ICD-10-CM

## 2019-11-25 ENCOUNTER — Encounter (HOSPITAL_COMMUNITY): Payer: Medicare Other

## 2019-11-25 DIAGNOSIS — D689 Coagulation defect, unspecified: Secondary | ICD-10-CM | POA: Diagnosis not present

## 2019-11-25 DIAGNOSIS — D509 Iron deficiency anemia, unspecified: Secondary | ICD-10-CM | POA: Diagnosis not present

## 2019-11-25 DIAGNOSIS — N186 End stage renal disease: Secondary | ICD-10-CM | POA: Diagnosis not present

## 2019-11-25 DIAGNOSIS — D631 Anemia in chronic kidney disease: Secondary | ICD-10-CM | POA: Diagnosis not present

## 2019-11-25 DIAGNOSIS — Z992 Dependence on renal dialysis: Secondary | ICD-10-CM | POA: Diagnosis not present

## 2019-11-25 DIAGNOSIS — N2581 Secondary hyperparathyroidism of renal origin: Secondary | ICD-10-CM | POA: Diagnosis not present

## 2019-11-26 ENCOUNTER — Encounter (INDEPENDENT_AMBULATORY_CARE_PROVIDER_SITE_OTHER): Payer: Medicare Other | Admitting: Ophthalmology

## 2019-11-27 DIAGNOSIS — N2581 Secondary hyperparathyroidism of renal origin: Secondary | ICD-10-CM | POA: Diagnosis not present

## 2019-11-27 DIAGNOSIS — D509 Iron deficiency anemia, unspecified: Secondary | ICD-10-CM | POA: Diagnosis not present

## 2019-11-27 DIAGNOSIS — D689 Coagulation defect, unspecified: Secondary | ICD-10-CM | POA: Diagnosis not present

## 2019-11-27 DIAGNOSIS — N186 End stage renal disease: Secondary | ICD-10-CM | POA: Diagnosis not present

## 2019-11-27 DIAGNOSIS — Z992 Dependence on renal dialysis: Secondary | ICD-10-CM | POA: Diagnosis not present

## 2019-11-27 DIAGNOSIS — D631 Anemia in chronic kidney disease: Secondary | ICD-10-CM | POA: Diagnosis not present

## 2019-11-29 ENCOUNTER — Encounter: Payer: Medicare Other | Admitting: Podiatry

## 2019-11-29 NOTE — Progress Notes (Addendum)
[facility] Note  12/01/2019     CHIEF COMPLAINT Patient presents for Retina Follow Up   HISTORY OF PRESENT ILLNESS: [name] is a 69 y.o. female who presents to the clinic today for:   HPI    Retina Follow Up    Patient presents with  Diabetic Retinopathy.  In both eyes.  This started months ago.  Severity is moderate.  Duration of 12.5 weeks.  Since onset it is stable.  I, the attending physician,  performed the HPI with the patient and updated documentation appropriately.          Comments    69 y/o female pt here for 12.5 wk f/u for mod NPDR w/mac edema OU.  No change in [name] OU.  Denies pain, FOL, floaters.  TheraTears and Pataday prn OU.  BS 105 this a.m.  A1C 6.5.       Last edited by [name], MD on 12/01/2019 11:27 AM. (History)    pt states her vision is "pretty much the same", she has her right eye schedule for cataract sx next Wednesday  Referring physician: [name], MD [place],  [place] [address]  HISTORICAL INFORMATION:   Selected notes from the MEDICAL RECORD NUMBER Referred by Dr. [name] for concern of BRVO OS LEE: 10.03.19 (C. Weaver) [BCVA: OD: 20/30 OS: 20/50] Ocular Hx-cataracts OU, HTR OU, NPDR OU, DES PMH-DM (O9G: 7.3, takes Trulicity and [place]), HTN, HLD     CURRENT MEDICATIONS: Current Outpatient Medications (Ophthalmic Drugs)  Medication Sig  . Carboxymethylcellulose Sodium (THERATEARS OP) Place 1 drop into both eyes daily as needed (dry eyes).   No current facility-administered medications for this visit. (Ophthalmic Drugs)   Current Outpatient Medications (Other)  Medication Sig  . bisacodyl (BISACODYL) 5 MG EC tablet Take 10 mg by mouth daily as needed for moderate constipation.  . Blood Glucose Monitoring Suppl (ONE TOUCH ULTRA 2) w/Device KIT Use to check BS BID-QID Dx:E11.9  . BREO ELLIPTA 100-25 MCG/INH AEPB Inhale 1 puff by mouth once daily (Patient taking  differently: Inhale 1 puff into the lungs daily. )  . diphenhydrAMINE (BENADRYL) 25 MG tablet Take 25 mg by mouth daily as needed for allergies.  Marland Kitchen gabapentin (NEURONTIN) 300 MG capsule TAKE 1 CAPSULE BY MOUTH THREE TIMES DAILY (Patient taking differently: Take 300 mg by mouth 3 (three) times daily as needed (leg pain). )  . Insulin Pen Needle (LIVE BETTER PEN NEEDLES) 31G X 6 MM MISC To use with Tresiba pens daily  . Lancets (ONETOUCH ULTRASOFT) lancets Use as instructed  . lidocaine-prilocaine (EMLA) cream Apply 1 application topically as needed (port access).  . Methoxy PEG-Epoetin Beta (MIRCERA IJ) Mircera  . multivitamin (RENA-VIT) TABS tablet Take 1 tablet by mouth daily.  Glory Rosebush ULTRA test strip USE AS DIRECTED TO MONITOR  FSBS 3 TIMES DAILY  . oxyCODONE-acetaminophen (PERCOCET) 5-325 MG tablet Take 1 tablet by mouth every 6 (six) hours as needed for severe pain.  Marland Kitchen PROAIR HFA 108 (90 Base) MCG/ACT inhaler Inhale 2 puffs into the lungs every 6 (six) hours as needed for wheezing or shortness of breath.  . rosuvastatin (CRESTOR) 20 MG tablet Take 1 tablet (20 mg total) by mouth daily.  . sevelamer carbonate (RENVELA) 800 MG tablet Take 800 mg by mouth 2 (two) times daily with a meal.  . sulfamethoxazole-trimethoprim (BACTRIM DS) 800-160 MG tablet Take 1 tablet by mouth 2 (two) times  daily.  . TRESIBA FLEXTOUCH 100 UNIT/ML FlexTouch Pen INJECT 150 UNITS SUBCUTANEOUSLY ONCE DAILY (Patient taking differently: Inject 50-150 Units into the skin daily as needed (high blood over 150). )   Current Facility-Administered Medications (Other)  Medication Route  . Bevacizumab (AVASTIN) SOLN 1.25 mg Intravitreal  . Bevacizumab (AVASTIN) SOLN 1.25 mg Intravitreal  . Bevacizumab (AVASTIN) SOLN 1.25 mg Intravitreal  . Bevacizumab (AVASTIN) SOLN 1.25 mg Intravitreal  . Bevacizumab (AVASTIN) SOLN 1.25 mg Intravitreal      REVIEW OF SYSTEMS: ROS    Positive for: Genitourinary, Endocrine,  Cardiovascular, Eyes   Negative for: Constitutional, Gastrointestinal, Neurological, Skin, Musculoskeletal, HENT, Respiratory, Psychiatric, Allergic/Imm, Heme/Lymph   Last edited by Matthew Folks, COA on 12/01/2019  9:21 AM. (History)       ALLERGIES Allergies  Allergen Reactions  . Penicillins Other (See Comments)    UNSPECIFIED CHILDHOOD REACTION  Has patient had a PCN reaction causing immediate rash, facial/tongue/throat swelling, SOB or lightheadedness with hypotension: Unknown Has patient had a PCN reaction causing severe rash involving mucus membranes or skin necrosis: Unknown Has patient had a PCN reaction that required hospitalization: Unknown Has patient had a PCN reaction occurring within the last 10 years: Unknown If all of the above answers are "NO", then may proceed with Cephalosporin use.   . Adhesive [Tape] Rash    PAST MEDICAL HISTORY Past Medical History:  Diagnosis Date  . Anemia   . Anemia associated with chronic renal failure   . Anemia in chronic kidney disease 09/29/2018  . Arthritis   . Asthma   . Cataract    OD  . CKD stage 3 due to type 2 diabetes mellitus ([place])   . Coronary artery calcification seen on CAT scan 02/17/2018   Coronary calcification on CT  . Depression   . Diabetic retinopathy of both eyes ([place])   . Diabetic ulcer of right foot associated with type 2 diabetes mellitus ([place])   . Essential hypertension 02/17/2018   Essential hypertension  . GERD (gastroesophageal reflux disease)    pt denies  . HLD (hyperlipidemia)   . Hypertension   . Hypertensive retinopathy    OU  . Left ventricular dysfunction 04/07/2018   Left ventricle dysfunction  . Microalbuminuria due to type 2 diabetes mellitus ([place])   . Neuromuscular disorder ([place])    diabetic neuropathy  . Nonproliferative retinopathy due to secondary diabetes ([place])   . Nonsustained ventricular tachycardia ([place]) 08/28/2018   Nonsustained ventricular tachycardia  . Pneumonia    hx of    . Renal cell cancer ([place])   . Right renal mass 03/10/2017  . Type 2 diabetes, controlled, with neuropathy ([place]) 06/22/2013  . Ulcer of other part of foot 06/22/2013  . 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 AMPUTATION TOE Right 10/29/2019   Procedure: RIGHT 2ND TOE AMPUTATION;  Surgeon: Edrick Kins, DPM;  Location: [facility];  Service: Podiatry;  Laterality: Right;  . BASCILIC VEIN TRANSPOSITION Left 08/08/2017   Procedure: BASILIC VEIN TRANSPOSITION FIRST STAGE LEFT ARM;  Surgeon: Serafina Mitchell, MD;  Location: [facility];  Service: Vascular;  Laterality: Left;  . BASCILIC VEIN TRANSPOSITION Left 05/14/2018   Procedure: SECOND STAGE BASILIC VEIN TRANSPOSITION LEFT ARM;  Surgeon: Serafina Mitchell, MD;  Location: [facility];  Service: Vascular;  Laterality: Left;  . CATARACT EXTRACTION Left   . EYE SURGERY    . ROBOTIC ADRENALECTOMY Left 03/10/2017  Procedure: XI ROBOTIC ADRENALECTOMY;  Surgeon: Nickie Retort, MD;  Location: [facility];  Service: Urology;  Laterality: Left;  . ROBOTIC ASSITED PARTIAL NEPHRECTOMY Right 03/10/2017   Procedure: XI ROBOTIC ASSITED RADICAL NEPHRECTOMY;  Surgeon: Nickie Retort, MD;  Location: [facility];  Service: Urology;  Laterality: Right;  . TOE AMPUTATION Bilateral    both great toe ,, left foot 2nd toe 1/2    FAMILY HISTORY Family History  Problem Relation Age of Onset  . Heart disease Mother   . Diabetes Sister     SOCIAL HISTORY Social History   Tobacco Use  . Smoking status: Never Smoker  . Smokeless tobacco: Never Used  Substance Use Topics  . Alcohol use: Yes    Alcohol/week: 0.0 standard drinks    Comment: occ  . Drug use: No         OPHTHALMIC EXAM:  Base Eye Exam    Visual Acuity (Snellen - Linear)      Right Left   Dist [place] 20/60 -2 20/30 -2   Dist ph [place] 20/30 -2 20/25       Tonometry (Tonopen, 9:24 AM)      Right Left   Pressure 13 13        Pupils      Dark Light Shape React APD   Right 3 2 Round Minimal None   Left 3 2 Round Minimal None       Visual Fields (Counting fingers)      Left Right    Full Full       Extraocular Movement      Right Left    Full, Ortho Full, Ortho       Neuro/Psych    Oriented x3: Yes   Mood/Affect: Normal       Dilation    Both eyes: 1.0% Mydriacyl, 2.5% Phenylephrine @ 9:24 AM        Slit Lamp and Fundus Exam    Slit Lamp Exam      Right Left   Lids/Lashes Dermatochalasis - upper lid, mild MGD Dermatochalasis - upper lid, Telangiectasia, mild MGD   Conjunctiva/Sclera White and quiet White and quiet   Cornea 1+ Punctate epithelial erosions, mild Arcus Well-healed temporal cataract wound   Anterior Chamber Deep and quiet Deep   Iris Round and dilated, No NVI Round and well dilated   Lens 2-3+ Nuclear sclerosis with early brunescence, 3+ Cortical cataract, 2-3+ Posterior subcapsular cataract mild PCIOL in good position   Vitreous Vitreous syneresis, Posterior vitreous detachment Vitreous syneresis       Fundus Exam      Right Left   Disc Pink and Sharp trace pallor, Sharp rim   C/D Ratio 0.3 0.3   Macula good foveal reflex, scattered MA and IRH greatest nasally, Flat, good foveal reflex, scattered blot hemes greatest inferiorly, +cystic changes nasal macula   Vessels Vascular attenuation, Tortuous, AV crossing changes, blot heme along IT arcades Tortuous, Vascular attenuation, AV crossing changes   Periphery Attached, scattered IRH greatest posteriorly and small CWS nasal to disc - fading Attached, scattered DBH          IMAGING AND PROCEDURES  Imaging and Procedures for _0 @  OCT, Retina - OU - Both Eyes       Right Eye Quality was good. Central Foveal Thickness: 244. Progression has been stable. Findings include normal foveal contour, no SRF, no IRF (Persistent peripapillary cystic changes nasal macula -- stably improved).   Left Eye Quality  was good. Central  Foveal Thickness: 248. Progression has been stable. Findings include normal foveal contour, intraretinal fluid, no SRF, vitreomacular adhesion , intraretinal hyper-reflective material (Mild interval increase in non-central cystic changes).   Notes *Images captured and stored on drive  Diagnosis / Impression:  NFP; +IRF; no SRF OU noncentral DME OU (OS > OD) OS - Mild interval increase in non-central cystic changes  Clinical management:  See below  Abbreviations: NFP - Normal foveal profile. CME - cystoid macular edema. PED - pigment epithelial detachment. IRF - intraretinal fluid. SRF - subretinal fluid. EZ - ellipsoid zone. ERM - epiretinal membrane. ORA - outer retinal atrophy. ORT - outer retinal tubulation. SRHM - subretinal hyper-reflective material        Intravitreal Injection, Pharmacologic Agent - OS - Left Eye       Time Out 12/01/2019. 9:03 AM. Confirmed correct patient, procedure, site, and patient consented.   Anesthesia Topical anesthesia was used. Anesthetic medications included Lidocaine 2%, Proparacaine 0.5%.   Procedure Preparation included 5% betadine to ocular surface, eyelid speculum. A supplied needle was used.   Injection:  1.25 mg Bevacizumab (AVASTIN) SOLN   NDC: 97588-325-49, Lot: [id number] , Expiration date: 02/02/2020   Route: Intravitreal, Site: Left Eye, Waste: 0 mL  Post-op Post injection exam found visual acuity of at least counting fingers. The patient tolerated the procedure well. There were no complications. The patient received written and verbal post procedure care education.                 ASSESSMENT/PLAN:    ICD-10-CM   1. Moderate nonproliferative diabetic retinopathy of both eyes with macular edema associated with type 2 diabetes mellitus (HCC)  I26.4158 Intravitreal Injection, Pharmacologic Agent - OS - Left Eye    Bevacizumab (AVASTIN) SOLN 1.25 mg  2. Essential hypertension  I10   3. Retinal edema  H35.81 OCT, Retina - OU  - Both Eyes  4. Hypertensive retinopathy of both eyes  H35.033   5. Combined forms of age-related cataract of right eye  H25.811   6. Pseudophakia  Z96.1     1,2 Moderate non-proliferative diabetic retinopathy w/ DME OU (OS > OD)  - Initial exam shows scattered MA, BCVA 20/50 OS  - Initial FA (05/12/18) shows leaking MA, no NV OS  - Initial OCT showed diabetic macular edema OU (OS > OD)   - s/p IVA #1 OS (10.22.19), #2 (11.20.19), #3 (12.20.19), #4 (01.31.20), #5 (03.13.20), #6 (04.22.20), #7 (06.05.20), #8 (07.17.20), #9 (08.28.20),#10 (10.07.20), #11 (12.04.20), #12 (02.21.21)  - today BCVA stable OU -- 20/30 OD (mostly limited by cataract), 20/25 OS   - OCT - mild interval increase in non-central cystic changes OS  - recommend IVA #13 OS today, 05.12.21 w/ decrease in interval to 10 wks  - discussed possible need for IVA OD in the future  - pt wishes to proceed IVA OS  - RBA of procedure discussed, questions answered  - informed consent obtained  - Avastin informed consent form signed and scanned on 12.04.2020  - see procedure note  - f/u in 10 wks, DFE, OCT, possible injxn(s)  3,4. Hypertensive retinopathy OU  - discussed importance of tight BP control  - monitor  5. Combined Form Cataracts OD  - The symptoms of cataract, surgical options, and treatments and risks were discussed with patient.  - discussed diagnosis and progression  - visually significant OD  - under the expert management of Dr. [name]  - surgery scheduled for next Wednesday,  Dec 08, 2019  6. Pseudophakia OS  - s/p CE/IOL OS w/ Dr. [name]  - beautiful surgery, doing well  - monitor   Ophthalmic Meds Ordered this visit:  Meds ordered this encounter  Medications  . Bevacizumab (AVASTIN) SOLN 1.25 mg       Return in about 10 weeks (around 02/09/2020) for f/u NPDR OU, DFE, OCT.  There are no Patient Instructions on file for this visit.   Explained the diagnoses, plan, and follow up with the patient  and they expressed understanding.  Patient expressed understanding of the importance of proper follow up care.   This document serves as a record of services personally performed by Gardiner Sleeper, MD, PhD. It was created on their behalf by Ernest Mallick, OA, an ophthalmic assistant. The creation of this record is the provider's dictation and/or activities during the visit.    Electronically signed by: Ernest Mallick, OA 05.12.2021 11:37 AM  Gardiner Sleeper, M.D., Ph.D. Diseases & Surgery of the Retina and [place] 12/01/2019   I have reviewed the above documentation for accuracy and completeness, and I agree with the above. Gardiner Sleeper, M.D., Ph.D. 12/01/19 11:37 AM   Abbreviations: M myopia (nearsighted); A astigmatism; H hyperopia (farsighted); P presbyopia; Mrx spectacle prescription;  CTL contact lenses; OD right eye; OS left eye; OU both eyes  XT exotropia; ET esotropia; PEK punctate epithelial keratitis; PEE punctate epithelial erosions; DES dry eye syndrome; MGD meibomian gland dysfunction; ATs artificial tears; PFAT's preservative free artificial tears; [place] nuclear sclerotic cataract; PSC posterior subcapsular cataract; ERM epi-retinal membrane; PVD posterior vitreous detachment; RD retinal detachment; DM diabetes mellitus; DR diabetic retinopathy; NPDR non-proliferative diabetic retinopathy; PDR proliferative diabetic retinopathy; CSME clinically significant macular edema; DME diabetic macular edema; dbh dot blot hemorrhages; CWS cotton wool spot; POAG primary open angle glaucoma; C/D cup-to-disc ratio; HVF humphrey visual field; GVF goldmann visual field; OCT optical coherence tomography; IOP intraocular pressure; BRVO Branch retinal vein occlusion; CRVO central retinal vein occlusion; CRAO central retinal artery occlusion; BRAO branch retinal artery occlusion; RT retinal tear; SB scleral buckle; PPV pars plana vitrectomy; VH Vitreous hemorrhage; PRP  panretinal laser photocoagulation; IVK intravitreal kenalog; VMT vitreomacular traction; MH Macular hole;  NVD neovascularization of the disc; NVE neovascularization elsewhere; AREDS age related eye disease study; ARMD age related macular degeneration; POAG primary open angle glaucoma; EBMD epithelial/anterior basement membrane dystrophy; ACIOL anterior chamber intraocular lens; IOL intraocular lens; PCIOL posterior chamber intraocular lens; Phaco/IOL phacoemulsification with intraocular lens placement; [place] photorefractive keratectomy; LASIK laser assisted in situ keratomileusis; HTN hypertension; DM diabetes mellitus; COPD chronic obstructive pulmonary disease

## 2019-11-30 DIAGNOSIS — N2581 Secondary hyperparathyroidism of renal origin: Secondary | ICD-10-CM | POA: Diagnosis not present

## 2019-11-30 DIAGNOSIS — D631 Anemia in chronic kidney disease: Secondary | ICD-10-CM | POA: Diagnosis not present

## 2019-11-30 DIAGNOSIS — Z992 Dependence on renal dialysis: Secondary | ICD-10-CM | POA: Diagnosis not present

## 2019-11-30 DIAGNOSIS — N186 End stage renal disease: Secondary | ICD-10-CM | POA: Diagnosis not present

## 2019-11-30 DIAGNOSIS — D689 Coagulation defect, unspecified: Secondary | ICD-10-CM | POA: Diagnosis not present

## 2019-11-30 DIAGNOSIS — D509 Iron deficiency anemia, unspecified: Secondary | ICD-10-CM | POA: Diagnosis not present

## 2019-11-30 NOTE — Progress Notes (Signed)
Subjective:  Patient presents today status post right second toe amputation. DOS: 10/29/2019. She states she is doing well. She reports a decrease in drainage. She has been changing her dressing as directed. There are no modifying factors noted. Patient is here for further evaluation and treatment.   Past Medical History:  Diagnosis Date  . Anemia   . Anemia associated with chronic renal failure   . Anemia in chronic kidney disease 09/29/2018  . Arthritis   . Asthma   . Cataract    OD  . CKD stage 3 due to type 2 diabetes mellitus ([place])   . Coronary artery calcification seen on CAT scan 02/17/2018   Coronary calcification on CT  . Depression   . Diabetic retinopathy of both eyes ([place])   . Diabetic ulcer of right foot associated with type 2 diabetes mellitus ([place])   . Essential hypertension 02/17/2018   Essential hypertension  . GERD (gastroesophageal reflux disease)    pt denies  . HLD (hyperlipidemia)   . Hypertension   . Hypertensive retinopathy    OU  . Left ventricular dysfunction 04/07/2018   Left ventricle dysfunction  . Microalbuminuria due to type 2 diabetes mellitus ([place])   . Neuromuscular disorder ([place])    diabetic neuropathy  . Nonproliferative retinopathy due to secondary diabetes ([place])   . Nonsustained ventricular tachycardia ([place]) 08/28/2018   Nonsustained ventricular tachycardia  . Pneumonia    hx of   . Renal cell cancer ([place])   . Right renal mass 03/10/2017  . Type 2 diabetes, controlled, with neuropathy ([place]) 06/22/2013  . Ulcer of other part of foot 06/22/2013  . 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  Periwound appears stable.  Amputation site is granular with minimal serosanguineous drainage.  There is no malodor noted.  Moderate erythema and edema around the surgical forefoot  Neurovascular: Epicritic and protective  threshold absent bilateral lower extremities  Vascular: Today the pedal pulses are diminished and I am unable to palpate.  Capillary refill is normal within digits 3-5.  We will order arterial Doppler to make sure the patient is getting blood flow for healing.  Musculoskeletal: Absence of the first and second digits right foot.  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  Assessment: 1. s/p second toe amputation right foot. DOS: 10/29/2019 2. Diabetes mellitus type 2-uncontrolled 3. CKD stage III secondary to type 2 diabetes mellitus 4. Dehiscence of amputation stump   Plan of Care:  1. Patient was evaluated. 2. Medically necessary excisional debridement including subcutaneous tissue was performed using a tissue nipper and a chisel blade. Excisional debridement of all the necrotic nonviable tissue down to healthy bleeding viable tissue was performed with post-debridement measurements same as pre-. 3. The wound was cleansed and dry sterile dressing applied. 4. Alginate dressings provided to patient.  5. Arterial dopplers pending.  6. Return to clinic in 3 weeks.    Edrick Kins, DPM [facility]  Dr. Edrick Kins, [address]                                        [place], [place] [address]  Office [phone]  Fax [phone]

## 2019-12-01 ENCOUNTER — Other Ambulatory Visit: Payer: Self-pay

## 2019-12-01 ENCOUNTER — Encounter (INDEPENDENT_AMBULATORY_CARE_PROVIDER_SITE_OTHER): Payer: Self-pay | Admitting: Ophthalmology

## 2019-12-01 ENCOUNTER — Ambulatory Visit (INDEPENDENT_AMBULATORY_CARE_PROVIDER_SITE_OTHER): Payer: Medicare Other | Admitting: Ophthalmology

## 2019-12-01 ENCOUNTER — Ambulatory Visit (HOSPITAL_COMMUNITY)
Admission: RE | Admit: 2019-12-01 | Discharge: 2019-12-01 | Disposition: A | Discharge status: Home / Self Care | Payer: Medicare Other | Source: Ambulatory Visit | Attending: Cardiovascular Disease | Admitting: Cardiovascular Disease

## 2019-12-01 DIAGNOSIS — H3581 Retinal edema: Secondary | ICD-10-CM | POA: Diagnosis not present

## 2019-12-01 DIAGNOSIS — M869 Osteomyelitis, unspecified: Secondary | ICD-10-CM

## 2019-12-01 DIAGNOSIS — H35033 Hypertensive retinopathy, bilateral: Secondary | ICD-10-CM | POA: Diagnosis not present

## 2019-12-01 DIAGNOSIS — Z9889 Other specified postprocedural states: Secondary | ICD-10-CM

## 2019-12-01 DIAGNOSIS — E113313 Type 2 diabetes mellitus with moderate nonproliferative diabetic retinopathy with macular edema, bilateral: Secondary | ICD-10-CM

## 2019-12-01 DIAGNOSIS — Z961 Presence of intraocular lens: Secondary | ICD-10-CM

## 2019-12-01 DIAGNOSIS — R0989 Other specified symptoms and signs involving the circulatory and respiratory systems: Secondary | ICD-10-CM

## 2019-12-01 DIAGNOSIS — C7972 Secondary malignant neoplasm of left adrenal gland: Secondary | ICD-10-CM | POA: Diagnosis not present

## 2019-12-01 DIAGNOSIS — H25813 Combined forms of age-related cataract, bilateral: Secondary | ICD-10-CM

## 2019-12-01 DIAGNOSIS — C642 Malignant neoplasm of left kidney, except renal pelvis: Secondary | ICD-10-CM | POA: Diagnosis not present

## 2019-12-01 DIAGNOSIS — I10 Essential (primary) hypertension: Secondary | ICD-10-CM

## 2019-12-01 DIAGNOSIS — Z85528 Personal history of other malignant neoplasm of kidney: Secondary | ICD-10-CM | POA: Diagnosis not present

## 2019-12-01 DIAGNOSIS — H25811 Combined forms of age-related cataract, right eye: Secondary | ICD-10-CM

## 2019-12-01 MED ORDER — BEVACIZUMAB CHEMO INJECTION 1.25MG/0.05ML SYRINGE FOR KALEIDOSCOPE
1.2500 mg | INTRAVITREAL | Status: AC | PRN
  Administered 2019-12-01: 1.25 mg via INTRAVITREAL

## 2019-12-02 ENCOUNTER — Encounter: Payer: Self-pay | Admitting: General Practice

## 2019-12-02 DIAGNOSIS — N2581 Secondary hyperparathyroidism of renal origin: Secondary | ICD-10-CM | POA: Diagnosis not present

## 2019-12-02 DIAGNOSIS — D631 Anemia in chronic kidney disease: Secondary | ICD-10-CM | POA: Diagnosis not present

## 2019-12-02 DIAGNOSIS — N186 End stage renal disease: Secondary | ICD-10-CM | POA: Diagnosis not present

## 2019-12-02 DIAGNOSIS — Z992 Dependence on renal dialysis: Secondary | ICD-10-CM | POA: Diagnosis not present

## 2019-12-02 DIAGNOSIS — D689 Coagulation defect, unspecified: Secondary | ICD-10-CM | POA: Diagnosis not present

## 2019-12-02 DIAGNOSIS — D509 Iron deficiency anemia, unspecified: Secondary | ICD-10-CM | POA: Diagnosis not present

## 2019-12-04 DIAGNOSIS — N2581 Secondary hyperparathyroidism of renal origin: Secondary | ICD-10-CM | POA: Diagnosis not present

## 2019-12-04 DIAGNOSIS — N186 End stage renal disease: Secondary | ICD-10-CM | POA: Diagnosis not present

## 2019-12-04 DIAGNOSIS — D631 Anemia in chronic kidney disease: Secondary | ICD-10-CM | POA: Diagnosis not present

## 2019-12-04 DIAGNOSIS — D509 Iron deficiency anemia, unspecified: Secondary | ICD-10-CM | POA: Diagnosis not present

## 2019-12-04 DIAGNOSIS — Z992 Dependence on renal dialysis: Secondary | ICD-10-CM | POA: Diagnosis not present

## 2019-12-04 DIAGNOSIS — D689 Coagulation defect, unspecified: Secondary | ICD-10-CM | POA: Diagnosis not present

## 2019-12-07 DIAGNOSIS — Z992 Dependence on renal dialysis: Secondary | ICD-10-CM | POA: Diagnosis not present

## 2019-12-07 DIAGNOSIS — D689 Coagulation defect, unspecified: Secondary | ICD-10-CM | POA: Diagnosis not present

## 2019-12-07 DIAGNOSIS — N2581 Secondary hyperparathyroidism of renal origin: Secondary | ICD-10-CM | POA: Diagnosis not present

## 2019-12-07 DIAGNOSIS — D509 Iron deficiency anemia, unspecified: Secondary | ICD-10-CM | POA: Diagnosis not present

## 2019-12-07 DIAGNOSIS — D631 Anemia in chronic kidney disease: Secondary | ICD-10-CM | POA: Diagnosis not present

## 2019-12-07 DIAGNOSIS — N186 End stage renal disease: Secondary | ICD-10-CM | POA: Diagnosis not present

## 2019-12-08 DIAGNOSIS — H2511 Age-related nuclear cataract, right eye: Secondary | ICD-10-CM | POA: Diagnosis not present

## 2019-12-08 DIAGNOSIS — H25811 Combined forms of age-related cataract, right eye: Secondary | ICD-10-CM | POA: Diagnosis not present

## 2019-12-08 DIAGNOSIS — H25041 Posterior subcapsular polar age-related cataract, right eye: Secondary | ICD-10-CM | POA: Diagnosis not present

## 2019-12-08 DIAGNOSIS — H25011 Cortical age-related cataract, right eye: Secondary | ICD-10-CM | POA: Diagnosis not present

## 2019-12-09 DIAGNOSIS — D689 Coagulation defect, unspecified: Secondary | ICD-10-CM | POA: Diagnosis not present

## 2019-12-09 DIAGNOSIS — Z992 Dependence on renal dialysis: Secondary | ICD-10-CM | POA: Diagnosis not present

## 2019-12-09 DIAGNOSIS — D509 Iron deficiency anemia, unspecified: Secondary | ICD-10-CM | POA: Diagnosis not present

## 2019-12-09 DIAGNOSIS — N2581 Secondary hyperparathyroidism of renal origin: Secondary | ICD-10-CM | POA: Diagnosis not present

## 2019-12-09 DIAGNOSIS — N186 End stage renal disease: Secondary | ICD-10-CM | POA: Diagnosis not present

## 2019-12-09 DIAGNOSIS — D631 Anemia in chronic kidney disease: Secondary | ICD-10-CM | POA: Diagnosis not present

## 2019-12-11 DIAGNOSIS — Z992 Dependence on renal dialysis: Secondary | ICD-10-CM | POA: Diagnosis not present

## 2019-12-11 DIAGNOSIS — N186 End stage renal disease: Secondary | ICD-10-CM | POA: Diagnosis not present

## 2019-12-11 DIAGNOSIS — D509 Iron deficiency anemia, unspecified: Secondary | ICD-10-CM | POA: Diagnosis not present

## 2019-12-11 DIAGNOSIS — N2581 Secondary hyperparathyroidism of renal origin: Secondary | ICD-10-CM | POA: Diagnosis not present

## 2019-12-11 DIAGNOSIS — D631 Anemia in chronic kidney disease: Secondary | ICD-10-CM | POA: Diagnosis not present

## 2019-12-11 DIAGNOSIS — D689 Coagulation defect, unspecified: Secondary | ICD-10-CM | POA: Diagnosis not present

## 2019-12-13 ENCOUNTER — Other Ambulatory Visit: Payer: Self-pay

## 2019-12-13 ENCOUNTER — Ambulatory Visit (INDEPENDENT_AMBULATORY_CARE_PROVIDER_SITE_OTHER): Payer: Medicare Other | Admitting: Podiatry

## 2019-12-13 DIAGNOSIS — R0989 Other specified symptoms and signs involving the circulatory and respiratory systems: Secondary | ICD-10-CM

## 2019-12-13 DIAGNOSIS — M869 Osteomyelitis, unspecified: Secondary | ICD-10-CM

## 2019-12-13 DIAGNOSIS — Z9889 Other specified postprocedural states: Secondary | ICD-10-CM

## 2019-12-13 NOTE — Progress Notes (Signed)
Subjective:  Patient presents today status post right second toe amputation. DOS: 10/29/2019. She states she is doing well. She reports a decrease in drainage. She has been changing her dressing as directed. There are no modifying factors noted. Patient is here for further evaluation and treatment.   Past Medical History:  Diagnosis Date  . Anemia   . Anemia associated with chronic renal failure   . Anemia in chronic kidney disease 09/29/2018  . Arthritis   . Asthma   . Cataract    OD  . CKD stage 3 due to type 2 diabetes mellitus ([place])   . Coronary artery calcification seen on CAT scan 02/17/2018   Coronary calcification on CT  . Depression   . Diabetic retinopathy of both eyes ([place])   . Diabetic ulcer of right foot associated with type 2 diabetes mellitus ([place])   . Essential hypertension 02/17/2018   Essential hypertension  . GERD (gastroesophageal reflux disease)    pt denies  . HLD (hyperlipidemia)   . Hypertension   . Hypertensive retinopathy    OU  . Left ventricular dysfunction 04/07/2018   Left ventricle dysfunction  . Microalbuminuria due to type 2 diabetes mellitus ([place])   . Neuromuscular disorder ([place])    diabetic neuropathy  . Nonproliferative retinopathy due to secondary diabetes ([place])   . Nonsustained ventricular tachycardia ([place]) 08/28/2018   Nonsustained ventricular tachycardia  . Pneumonia    hx of   . Renal cell cancer ([place])   . Right renal mass 03/10/2017  . Type 2 diabetes, controlled, with neuropathy ([place]) 06/22/2013  . Ulcer of other part of foot 06/22/2013  . 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  Periwound appears stable.  Amputation site is granular with minimal serosanguineous drainage.  There is no malodor noted.  Moderate erythema and edema around the surgical forefoot  Neurovascular: Epicritic and protective  threshold absent bilateral lower extremities  Vascular: Today the pedal pulses are diminished and I am unable to palpate.  Capillary refill is normal within digits 3-5.  We will order arterial Doppler to make sure the patient is getting blood flow for healing.  Musculoskeletal: Absence of the first and second digits right foot.  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  Assessment: 1. s/p second toe amputation right foot. DOS: 10/29/2019 2. Diabetes mellitus type 2-uncontrolled 3. CKD stage III secondary to type 2 diabetes mellitus 4. Dehiscence of amputation stump   Plan of Care:  1. Patient was evaluated. 2. Medically necessary excisional debridement including subcutaneous tissue was performed using a tissue nipper and a chisel blade. Excisional debridement of all the necrotic nonviable tissue down to healthy bleeding viable tissue was performed with post-debridement measurements same as pre-. 3. The wound was cleansed and dry sterile dressing applied. 4.  Continue alginate dressings daily 5. Arterial dopplers were reviewed today and within normal limits.  Based on the findings the patient has adequate flow.  6. Return to clinic in 4 weeks.    Edrick Kins, DPM [facility]  Dr. Edrick Kins, DPM    [address]  [place], [place] [address]                Office [phone]  Fax [phone]

## 2019-12-14 DIAGNOSIS — D689 Coagulation defect, unspecified: Secondary | ICD-10-CM | POA: Diagnosis not present

## 2019-12-14 DIAGNOSIS — D631 Anemia in chronic kidney disease: Secondary | ICD-10-CM | POA: Diagnosis not present

## 2019-12-14 DIAGNOSIS — N2581 Secondary hyperparathyroidism of renal origin: Secondary | ICD-10-CM | POA: Diagnosis not present

## 2019-12-14 DIAGNOSIS — N186 End stage renal disease: Secondary | ICD-10-CM | POA: Diagnosis not present

## 2019-12-14 DIAGNOSIS — D509 Iron deficiency anemia, unspecified: Secondary | ICD-10-CM | POA: Diagnosis not present

## 2019-12-14 DIAGNOSIS — Z992 Dependence on renal dialysis: Secondary | ICD-10-CM | POA: Diagnosis not present

## 2019-12-16 DIAGNOSIS — Z992 Dependence on renal dialysis: Secondary | ICD-10-CM | POA: Diagnosis not present

## 2019-12-16 DIAGNOSIS — N2581 Secondary hyperparathyroidism of renal origin: Secondary | ICD-10-CM | POA: Diagnosis not present

## 2019-12-16 DIAGNOSIS — D631 Anemia in chronic kidney disease: Secondary | ICD-10-CM | POA: Diagnosis not present

## 2019-12-16 DIAGNOSIS — D689 Coagulation defect, unspecified: Secondary | ICD-10-CM | POA: Diagnosis not present

## 2019-12-16 DIAGNOSIS — D509 Iron deficiency anemia, unspecified: Secondary | ICD-10-CM | POA: Diagnosis not present

## 2019-12-16 DIAGNOSIS — N186 End stage renal disease: Secondary | ICD-10-CM | POA: Diagnosis not present

## 2019-12-18 DIAGNOSIS — N2581 Secondary hyperparathyroidism of renal origin: Secondary | ICD-10-CM | POA: Diagnosis not present

## 2019-12-18 DIAGNOSIS — N186 End stage renal disease: Secondary | ICD-10-CM | POA: Diagnosis not present

## 2019-12-18 DIAGNOSIS — D689 Coagulation defect, unspecified: Secondary | ICD-10-CM | POA: Diagnosis not present

## 2019-12-18 DIAGNOSIS — D509 Iron deficiency anemia, unspecified: Secondary | ICD-10-CM | POA: Diagnosis not present

## 2019-12-18 DIAGNOSIS — Z992 Dependence on renal dialysis: Secondary | ICD-10-CM | POA: Diagnosis not present

## 2019-12-18 DIAGNOSIS — D631 Anemia in chronic kidney disease: Secondary | ICD-10-CM | POA: Diagnosis not present

## 2019-12-20 DIAGNOSIS — Z992 Dependence on renal dialysis: Secondary | ICD-10-CM | POA: Diagnosis not present

## 2019-12-20 DIAGNOSIS — N186 End stage renal disease: Secondary | ICD-10-CM | POA: Diagnosis not present

## 2019-12-20 DIAGNOSIS — I129 Hypertensive chronic kidney disease with stage 1 through stage 4 chronic kidney disease, or unspecified chronic kidney disease: Secondary | ICD-10-CM | POA: Diagnosis not present

## 2019-12-21 DIAGNOSIS — Z992 Dependence on renal dialysis: Secondary | ICD-10-CM | POA: Diagnosis not present

## 2019-12-21 DIAGNOSIS — D689 Coagulation defect, unspecified: Secondary | ICD-10-CM | POA: Diagnosis not present

## 2019-12-21 DIAGNOSIS — Z283 Underimmunization status: Secondary | ICD-10-CM | POA: Diagnosis not present

## 2019-12-21 DIAGNOSIS — N186 End stage renal disease: Secondary | ICD-10-CM | POA: Diagnosis not present

## 2019-12-21 DIAGNOSIS — N2581 Secondary hyperparathyroidism of renal origin: Secondary | ICD-10-CM | POA: Diagnosis not present

## 2019-12-21 DIAGNOSIS — D509 Iron deficiency anemia, unspecified: Secondary | ICD-10-CM | POA: Diagnosis not present

## 2019-12-22 DIAGNOSIS — Z85528 Personal history of other malignant neoplasm of kidney: Secondary | ICD-10-CM | POA: Diagnosis not present

## 2019-12-23 DIAGNOSIS — D689 Coagulation defect, unspecified: Secondary | ICD-10-CM | POA: Diagnosis not present

## 2019-12-23 DIAGNOSIS — Z992 Dependence on renal dialysis: Secondary | ICD-10-CM | POA: Diagnosis not present

## 2019-12-23 DIAGNOSIS — N2581 Secondary hyperparathyroidism of renal origin: Secondary | ICD-10-CM | POA: Diagnosis not present

## 2019-12-23 DIAGNOSIS — Z283 Underimmunization status: Secondary | ICD-10-CM | POA: Diagnosis not present

## 2019-12-23 DIAGNOSIS — D509 Iron deficiency anemia, unspecified: Secondary | ICD-10-CM | POA: Diagnosis not present

## 2019-12-23 DIAGNOSIS — N186 End stage renal disease: Secondary | ICD-10-CM | POA: Diagnosis not present

## 2019-12-25 DIAGNOSIS — Z283 Underimmunization status: Secondary | ICD-10-CM | POA: Diagnosis not present

## 2019-12-25 DIAGNOSIS — D509 Iron deficiency anemia, unspecified: Secondary | ICD-10-CM | POA: Diagnosis not present

## 2019-12-25 DIAGNOSIS — N2581 Secondary hyperparathyroidism of renal origin: Secondary | ICD-10-CM | POA: Diagnosis not present

## 2019-12-25 DIAGNOSIS — Z992 Dependence on renal dialysis: Secondary | ICD-10-CM | POA: Diagnosis not present

## 2019-12-25 DIAGNOSIS — N186 End stage renal disease: Secondary | ICD-10-CM | POA: Diagnosis not present

## 2019-12-25 DIAGNOSIS — D689 Coagulation defect, unspecified: Secondary | ICD-10-CM | POA: Diagnosis not present

## 2019-12-27 DIAGNOSIS — Z992 Dependence on renal dialysis: Secondary | ICD-10-CM | POA: Diagnosis not present

## 2019-12-27 DIAGNOSIS — N186 End stage renal disease: Secondary | ICD-10-CM | POA: Diagnosis not present

## 2019-12-27 DIAGNOSIS — I871 Compression of vein: Secondary | ICD-10-CM | POA: Diagnosis not present

## 2019-12-27 DIAGNOSIS — T82858A Stenosis of vascular prosthetic devices, implants and grafts, initial encounter: Secondary | ICD-10-CM | POA: Diagnosis not present

## 2019-12-28 DIAGNOSIS — N186 End stage renal disease: Secondary | ICD-10-CM | POA: Diagnosis not present

## 2019-12-28 DIAGNOSIS — Z992 Dependence on renal dialysis: Secondary | ICD-10-CM | POA: Diagnosis not present

## 2019-12-28 DIAGNOSIS — D689 Coagulation defect, unspecified: Secondary | ICD-10-CM | POA: Diagnosis not present

## 2019-12-28 DIAGNOSIS — Z283 Underimmunization status: Secondary | ICD-10-CM | POA: Diagnosis not present

## 2019-12-28 DIAGNOSIS — D509 Iron deficiency anemia, unspecified: Secondary | ICD-10-CM | POA: Diagnosis not present

## 2019-12-28 DIAGNOSIS — N2581 Secondary hyperparathyroidism of renal origin: Secondary | ICD-10-CM | POA: Diagnosis not present

## 2019-12-30 DIAGNOSIS — N2581 Secondary hyperparathyroidism of renal origin: Secondary | ICD-10-CM | POA: Diagnosis not present

## 2019-12-30 DIAGNOSIS — Z992 Dependence on renal dialysis: Secondary | ICD-10-CM | POA: Diagnosis not present

## 2019-12-30 DIAGNOSIS — D509 Iron deficiency anemia, unspecified: Secondary | ICD-10-CM | POA: Diagnosis not present

## 2019-12-30 DIAGNOSIS — D689 Coagulation defect, unspecified: Secondary | ICD-10-CM | POA: Diagnosis not present

## 2019-12-30 DIAGNOSIS — Z283 Underimmunization status: Secondary | ICD-10-CM | POA: Diagnosis not present

## 2019-12-30 DIAGNOSIS — N186 End stage renal disease: Secondary | ICD-10-CM | POA: Diagnosis not present

## 2020-01-01 DIAGNOSIS — Z283 Underimmunization status: Secondary | ICD-10-CM | POA: Diagnosis not present

## 2020-01-01 DIAGNOSIS — D509 Iron deficiency anemia, unspecified: Secondary | ICD-10-CM | POA: Diagnosis not present

## 2020-01-01 DIAGNOSIS — N186 End stage renal disease: Secondary | ICD-10-CM | POA: Diagnosis not present

## 2020-01-01 DIAGNOSIS — Z992 Dependence on renal dialysis: Secondary | ICD-10-CM | POA: Diagnosis not present

## 2020-01-01 DIAGNOSIS — N2581 Secondary hyperparathyroidism of renal origin: Secondary | ICD-10-CM | POA: Diagnosis not present

## 2020-01-01 DIAGNOSIS — D689 Coagulation defect, unspecified: Secondary | ICD-10-CM | POA: Diagnosis not present

## 2020-01-03 ENCOUNTER — Ambulatory Visit: Payer: Medicare Other | Admitting: Surgery

## 2020-01-04 DIAGNOSIS — Z992 Dependence on renal dialysis: Secondary | ICD-10-CM | POA: Diagnosis not present

## 2020-01-04 DIAGNOSIS — Z283 Underimmunization status: Secondary | ICD-10-CM | POA: Diagnosis not present

## 2020-01-04 DIAGNOSIS — N186 End stage renal disease: Secondary | ICD-10-CM | POA: Diagnosis not present

## 2020-01-04 DIAGNOSIS — D689 Coagulation defect, unspecified: Secondary | ICD-10-CM | POA: Diagnosis not present

## 2020-01-04 DIAGNOSIS — N2581 Secondary hyperparathyroidism of renal origin: Secondary | ICD-10-CM | POA: Diagnosis not present

## 2020-01-04 DIAGNOSIS — D509 Iron deficiency anemia, unspecified: Secondary | ICD-10-CM | POA: Diagnosis not present

## 2020-01-06 DIAGNOSIS — Z283 Underimmunization status: Secondary | ICD-10-CM | POA: Diagnosis not present

## 2020-01-06 DIAGNOSIS — N2581 Secondary hyperparathyroidism of renal origin: Secondary | ICD-10-CM | POA: Diagnosis not present

## 2020-01-06 DIAGNOSIS — D689 Coagulation defect, unspecified: Secondary | ICD-10-CM | POA: Diagnosis not present

## 2020-01-06 DIAGNOSIS — Z992 Dependence on renal dialysis: Secondary | ICD-10-CM | POA: Diagnosis not present

## 2020-01-06 DIAGNOSIS — N186 End stage renal disease: Secondary | ICD-10-CM | POA: Diagnosis not present

## 2020-01-06 DIAGNOSIS — D509 Iron deficiency anemia, unspecified: Secondary | ICD-10-CM | POA: Diagnosis not present

## 2020-01-08 DIAGNOSIS — Z992 Dependence on renal dialysis: Secondary | ICD-10-CM | POA: Diagnosis not present

## 2020-01-08 DIAGNOSIS — N2581 Secondary hyperparathyroidism of renal origin: Secondary | ICD-10-CM | POA: Diagnosis not present

## 2020-01-08 DIAGNOSIS — Z283 Underimmunization status: Secondary | ICD-10-CM | POA: Diagnosis not present

## 2020-01-08 DIAGNOSIS — D689 Coagulation defect, unspecified: Secondary | ICD-10-CM | POA: Diagnosis not present

## 2020-01-08 DIAGNOSIS — N186 End stage renal disease: Secondary | ICD-10-CM | POA: Diagnosis not present

## 2020-01-08 DIAGNOSIS — D509 Iron deficiency anemia, unspecified: Secondary | ICD-10-CM | POA: Diagnosis not present

## 2020-01-10 ENCOUNTER — Telehealth: Payer: Self-pay | Admitting: Family Medicine

## 2020-01-10 NOTE — Progress Notes (Signed)
  Chronic Care Management   Note  01/10/2020 Name: [name] MRN: [id number] DOB: 03-Feb-1951  [name] is a 69 y.o. year old female who is a primary care patient of Dennard Schaumann, Cammie Mcgee, MD. I reached out to [name] by phone today in response to a referral sent by Ms. Standley Brooking Stock's PCP, [name], MD.   [name] was given information about Chronic Care Management services today including:  1. CCM service includes personalized support from designated clinical staff supervised by her physician, including individualized plan of care and coordination with other care providers 2. 24/7 contact phone numbers for assistance for urgent and routine care needs. 3. Service will only be billed when office clinical staff spend 20 minutes or more in a month to coordinate care. 4. Only one practitioner may furnish and bill the service in a calendar month. 5. The patient may stop CCM services at any time (effective at the end of the month) by phone call to the office staff.   Patient agreed to services and verbal consent obtained.   Follow up plan:   [place]

## 2020-01-11 ENCOUNTER — Other Ambulatory Visit: Payer: Self-pay | Admitting: Family Medicine

## 2020-01-12 DIAGNOSIS — N186 End stage renal disease: Secondary | ICD-10-CM | POA: Diagnosis not present

## 2020-01-12 DIAGNOSIS — Z992 Dependence on renal dialysis: Secondary | ICD-10-CM | POA: Diagnosis not present

## 2020-01-12 DIAGNOSIS — Z283 Underimmunization status: Secondary | ICD-10-CM | POA: Diagnosis not present

## 2020-01-12 DIAGNOSIS — D509 Iron deficiency anemia, unspecified: Secondary | ICD-10-CM | POA: Diagnosis not present

## 2020-01-12 DIAGNOSIS — N2581 Secondary hyperparathyroidism of renal origin: Secondary | ICD-10-CM | POA: Diagnosis not present

## 2020-01-12 DIAGNOSIS — D689 Coagulation defect, unspecified: Secondary | ICD-10-CM | POA: Diagnosis not present

## 2020-01-13 DIAGNOSIS — N2581 Secondary hyperparathyroidism of renal origin: Secondary | ICD-10-CM | POA: Diagnosis not present

## 2020-01-13 DIAGNOSIS — Z992 Dependence on renal dialysis: Secondary | ICD-10-CM | POA: Diagnosis not present

## 2020-01-13 DIAGNOSIS — D689 Coagulation defect, unspecified: Secondary | ICD-10-CM | POA: Diagnosis not present

## 2020-01-13 DIAGNOSIS — D509 Iron deficiency anemia, unspecified: Secondary | ICD-10-CM | POA: Diagnosis not present

## 2020-01-13 DIAGNOSIS — Z283 Underimmunization status: Secondary | ICD-10-CM | POA: Diagnosis not present

## 2020-01-13 DIAGNOSIS — N186 End stage renal disease: Secondary | ICD-10-CM | POA: Diagnosis not present

## 2020-01-15 DIAGNOSIS — N186 End stage renal disease: Secondary | ICD-10-CM | POA: Diagnosis not present

## 2020-01-15 DIAGNOSIS — N2581 Secondary hyperparathyroidism of renal origin: Secondary | ICD-10-CM | POA: Diagnosis not present

## 2020-01-15 DIAGNOSIS — Z283 Underimmunization status: Secondary | ICD-10-CM | POA: Diagnosis not present

## 2020-01-15 DIAGNOSIS — Z992 Dependence on renal dialysis: Secondary | ICD-10-CM | POA: Diagnosis not present

## 2020-01-15 DIAGNOSIS — D689 Coagulation defect, unspecified: Secondary | ICD-10-CM | POA: Diagnosis not present

## 2020-01-15 DIAGNOSIS — D509 Iron deficiency anemia, unspecified: Secondary | ICD-10-CM | POA: Diagnosis not present

## 2020-01-17 ENCOUNTER — Other Ambulatory Visit: Payer: Self-pay

## 2020-01-17 ENCOUNTER — Ambulatory Visit (INDEPENDENT_AMBULATORY_CARE_PROVIDER_SITE_OTHER): Payer: Medicare Other | Admitting: Podiatry

## 2020-01-17 DIAGNOSIS — E0843 Diabetes mellitus due to underlying condition with diabetic autonomic (poly)neuropathy: Secondary | ICD-10-CM

## 2020-01-17 DIAGNOSIS — Z9889 Other specified postprocedural states: Secondary | ICD-10-CM

## 2020-01-17 DIAGNOSIS — R0989 Other specified symptoms and signs involving the circulatory and respiratory systems: Secondary | ICD-10-CM

## 2020-01-17 NOTE — Progress Notes (Signed)
   Subjective:  Patient PMHx diabetes mellitus, lateral toe amputations presents today status post right second toe amputation. DOS: 10/29/2019. She states she is doing well.  She states that the incision to the amputation site has healed completely.  She does have a new complaint today associated with left foot collapse.  She states when she walks her left foot collapses.  She is currently not wearing diabetic shoes. She also has a new complaint today regarding bilateral leg ulcers that keep recurring.  She states that she is requested to go to the wound care center but she has not received referral yet.   Past Medical History:  Diagnosis Date  . Anemia   . Anemia associated with chronic renal failure   . Anemia in chronic kidney disease 09/29/2018  . Arthritis   . Asthma   . Cataract    OD  . CKD stage 3 due to type 2 diabetes mellitus ([place])   . Coronary artery calcification seen on CAT scan 02/17/2018   Coronary calcification on CT  . Depression   . Diabetic retinopathy of both eyes ([place])   . Diabetic ulcer of right foot associated with type 2 diabetes mellitus ([place])   . Essential hypertension 02/17/2018   Essential hypertension  . GERD (gastroesophageal reflux disease)    pt denies  . HLD (hyperlipidemia)   . Hypertension   . Hypertensive retinopathy    OU  . Left ventricular dysfunction 04/07/2018   Left ventricle dysfunction  . Microalbuminuria due to type 2 diabetes mellitus ([place])   . Neuromuscular disorder ([place])    diabetic neuropathy  . Nonproliferative retinopathy due to secondary diabetes ([place])   . Nonsustained ventricular tachycardia ([place]) 08/28/2018   Nonsustained ventricular tachycardia  . Pneumonia    hx of   . Renal cell cancer ([place])   . Right renal mass 03/10/2017  . Type 2 diabetes, controlled, with neuropathy ([place]) 06/22/2013  . Ulcer of other part of foot 06/22/2013  . 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  Complete reepithelialization has occurred. The patient does have multiple bilateral leg ulcerations.   Neurovascular: Epicritic and protective threshold absent bilateral lower extremities  Vascular: Arterial Dopplers bilateral lower extremities taken 12/01/2019.  Musculoskeletal: Absence of the first and second digits right foot.  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  Assessment: 1. s/p second toe amputation right foot. DOS: 10/29/2019 2. Diabetes mellitus type 2-uncontrolled 3. CKD stage III secondary to type 2 diabetes mellitus  Plan of Care:  1. Patient was evaluated. 2.  Referral placed for the patient to go to the [facility] wound care center for bilateral leg wounds 3.  Appointment with Pedorthist for diabetic shoes and insoles 4.  Return to clinic as needed  Edrick Kins, DPM [facility]  Dr. Edrick Kins, [place]                                        [place], [place] [address]                Office [phone]  Fax [phone]

## 2020-01-18 ENCOUNTER — Telehealth: Payer: Self-pay | Admitting: *Deleted

## 2020-01-18 DIAGNOSIS — M869 Osteomyelitis, unspecified: Secondary | ICD-10-CM

## 2020-01-18 DIAGNOSIS — R0989 Other specified symptoms and signs involving the circulatory and respiratory systems: Secondary | ICD-10-CM

## 2020-01-18 DIAGNOSIS — E0843 Diabetes mellitus due to underlying condition with diabetic autonomic (poly)neuropathy: Secondary | ICD-10-CM

## 2020-01-18 DIAGNOSIS — L97512 Non-pressure chronic ulcer of other part of right foot with fat layer exposed: Secondary | ICD-10-CM

## 2020-01-18 DIAGNOSIS — Z992 Dependence on renal dialysis: Secondary | ICD-10-CM | POA: Diagnosis not present

## 2020-01-18 DIAGNOSIS — N2581 Secondary hyperparathyroidism of renal origin: Secondary | ICD-10-CM | POA: Diagnosis not present

## 2020-01-18 DIAGNOSIS — D509 Iron deficiency anemia, unspecified: Secondary | ICD-10-CM | POA: Diagnosis not present

## 2020-01-18 DIAGNOSIS — N186 End stage renal disease: Secondary | ICD-10-CM | POA: Diagnosis not present

## 2020-01-18 DIAGNOSIS — Z9889 Other specified postprocedural states: Secondary | ICD-10-CM

## 2020-01-18 DIAGNOSIS — D689 Coagulation defect, unspecified: Secondary | ICD-10-CM | POA: Diagnosis not present

## 2020-01-18 DIAGNOSIS — Z283 Underimmunization status: Secondary | ICD-10-CM | POA: Diagnosis not present

## 2020-01-18 NOTE — Telephone Encounter (Signed)
-----   Message from Edrick Kins, DPM sent at 01/17/2020 10:11 AM EDT ----- Regarding: Referral to [facility] wound care center Please refer patient to [facility] wound care center.   Dx: B/L leg wounds secondary to DM  Thanks, Dr. Amalia Hailey

## 2020-01-18 NOTE — Telephone Encounter (Signed)
Faxed rrequired form, clinicals present to 10/25/2019, and demographics to [place].

## 2020-01-19 DIAGNOSIS — I129 Hypertensive chronic kidney disease with stage 1 through stage 4 chronic kidney disease, or unspecified chronic kidney disease: Secondary | ICD-10-CM | POA: Diagnosis not present

## 2020-01-19 DIAGNOSIS — Z992 Dependence on renal dialysis: Secondary | ICD-10-CM | POA: Diagnosis not present

## 2020-01-19 DIAGNOSIS — N186 End stage renal disease: Secondary | ICD-10-CM | POA: Diagnosis not present

## 2020-01-20 DIAGNOSIS — N186 End stage renal disease: Secondary | ICD-10-CM | POA: Diagnosis not present

## 2020-01-20 DIAGNOSIS — D631 Anemia in chronic kidney disease: Secondary | ICD-10-CM | POA: Diagnosis not present

## 2020-01-20 DIAGNOSIS — N2581 Secondary hyperparathyroidism of renal origin: Secondary | ICD-10-CM | POA: Diagnosis not present

## 2020-01-20 DIAGNOSIS — D689 Coagulation defect, unspecified: Secondary | ICD-10-CM | POA: Diagnosis not present

## 2020-01-20 DIAGNOSIS — D509 Iron deficiency anemia, unspecified: Secondary | ICD-10-CM | POA: Diagnosis not present

## 2020-01-20 DIAGNOSIS — E1129 Type 2 diabetes mellitus with other diabetic kidney complication: Secondary | ICD-10-CM | POA: Diagnosis not present

## 2020-01-20 DIAGNOSIS — Z992 Dependence on renal dialysis: Secondary | ICD-10-CM | POA: Diagnosis not present

## 2020-01-22 DIAGNOSIS — D689 Coagulation defect, unspecified: Secondary | ICD-10-CM | POA: Diagnosis not present

## 2020-01-22 DIAGNOSIS — N186 End stage renal disease: Secondary | ICD-10-CM | POA: Diagnosis not present

## 2020-01-22 DIAGNOSIS — E1129 Type 2 diabetes mellitus with other diabetic kidney complication: Secondary | ICD-10-CM | POA: Diagnosis not present

## 2020-01-22 DIAGNOSIS — D509 Iron deficiency anemia, unspecified: Secondary | ICD-10-CM | POA: Diagnosis not present

## 2020-01-22 DIAGNOSIS — Z992 Dependence on renal dialysis: Secondary | ICD-10-CM | POA: Diagnosis not present

## 2020-01-22 DIAGNOSIS — N2581 Secondary hyperparathyroidism of renal origin: Secondary | ICD-10-CM | POA: Diagnosis not present

## 2020-01-22 DIAGNOSIS — D631 Anemia in chronic kidney disease: Secondary | ICD-10-CM | POA: Diagnosis not present

## 2020-01-23 ENCOUNTER — Other Ambulatory Visit: Payer: Self-pay | Admitting: Family Medicine

## 2020-01-25 DIAGNOSIS — E1129 Type 2 diabetes mellitus with other diabetic kidney complication: Secondary | ICD-10-CM | POA: Diagnosis not present

## 2020-01-25 DIAGNOSIS — N186 End stage renal disease: Secondary | ICD-10-CM | POA: Diagnosis not present

## 2020-01-25 DIAGNOSIS — N2581 Secondary hyperparathyroidism of renal origin: Secondary | ICD-10-CM | POA: Diagnosis not present

## 2020-01-25 DIAGNOSIS — Z992 Dependence on renal dialysis: Secondary | ICD-10-CM | POA: Diagnosis not present

## 2020-01-25 DIAGNOSIS — D689 Coagulation defect, unspecified: Secondary | ICD-10-CM | POA: Diagnosis not present

## 2020-01-25 DIAGNOSIS — D631 Anemia in chronic kidney disease: Secondary | ICD-10-CM | POA: Diagnosis not present

## 2020-01-25 DIAGNOSIS — D509 Iron deficiency anemia, unspecified: Secondary | ICD-10-CM | POA: Diagnosis not present

## 2020-01-27 DIAGNOSIS — D689 Coagulation defect, unspecified: Secondary | ICD-10-CM | POA: Diagnosis not present

## 2020-01-27 DIAGNOSIS — D509 Iron deficiency anemia, unspecified: Secondary | ICD-10-CM | POA: Diagnosis not present

## 2020-01-27 DIAGNOSIS — N2581 Secondary hyperparathyroidism of renal origin: Secondary | ICD-10-CM | POA: Diagnosis not present

## 2020-01-27 DIAGNOSIS — D631 Anemia in chronic kidney disease: Secondary | ICD-10-CM | POA: Diagnosis not present

## 2020-01-27 DIAGNOSIS — N186 End stage renal disease: Secondary | ICD-10-CM | POA: Diagnosis not present

## 2020-01-27 DIAGNOSIS — Z992 Dependence on renal dialysis: Secondary | ICD-10-CM | POA: Diagnosis not present

## 2020-01-27 DIAGNOSIS — E1129 Type 2 diabetes mellitus with other diabetic kidney complication: Secondary | ICD-10-CM | POA: Diagnosis not present

## 2020-01-29 DIAGNOSIS — N186 End stage renal disease: Secondary | ICD-10-CM | POA: Diagnosis not present

## 2020-01-29 DIAGNOSIS — N2581 Secondary hyperparathyroidism of renal origin: Secondary | ICD-10-CM | POA: Diagnosis not present

## 2020-01-29 DIAGNOSIS — Z992 Dependence on renal dialysis: Secondary | ICD-10-CM | POA: Diagnosis not present

## 2020-01-29 DIAGNOSIS — D689 Coagulation defect, unspecified: Secondary | ICD-10-CM | POA: Diagnosis not present

## 2020-01-29 DIAGNOSIS — D631 Anemia in chronic kidney disease: Secondary | ICD-10-CM | POA: Diagnosis not present

## 2020-01-29 DIAGNOSIS — E1129 Type 2 diabetes mellitus with other diabetic kidney complication: Secondary | ICD-10-CM | POA: Diagnosis not present

## 2020-01-29 DIAGNOSIS — D509 Iron deficiency anemia, unspecified: Secondary | ICD-10-CM | POA: Diagnosis not present

## 2020-02-01 DIAGNOSIS — D509 Iron deficiency anemia, unspecified: Secondary | ICD-10-CM | POA: Diagnosis not present

## 2020-02-01 DIAGNOSIS — D631 Anemia in chronic kidney disease: Secondary | ICD-10-CM | POA: Diagnosis not present

## 2020-02-01 DIAGNOSIS — E1129 Type 2 diabetes mellitus with other diabetic kidney complication: Secondary | ICD-10-CM | POA: Diagnosis not present

## 2020-02-01 DIAGNOSIS — D689 Coagulation defect, unspecified: Secondary | ICD-10-CM | POA: Diagnosis not present

## 2020-02-01 DIAGNOSIS — N2581 Secondary hyperparathyroidism of renal origin: Secondary | ICD-10-CM | POA: Diagnosis not present

## 2020-02-01 DIAGNOSIS — N186 End stage renal disease: Secondary | ICD-10-CM | POA: Diagnosis not present

## 2020-02-01 DIAGNOSIS — Z992 Dependence on renal dialysis: Secondary | ICD-10-CM | POA: Diagnosis not present

## 2020-02-03 DIAGNOSIS — N2581 Secondary hyperparathyroidism of renal origin: Secondary | ICD-10-CM | POA: Diagnosis not present

## 2020-02-03 DIAGNOSIS — D509 Iron deficiency anemia, unspecified: Secondary | ICD-10-CM | POA: Diagnosis not present

## 2020-02-03 DIAGNOSIS — N186 End stage renal disease: Secondary | ICD-10-CM | POA: Diagnosis not present

## 2020-02-03 DIAGNOSIS — D689 Coagulation defect, unspecified: Secondary | ICD-10-CM | POA: Diagnosis not present

## 2020-02-03 DIAGNOSIS — D631 Anemia in chronic kidney disease: Secondary | ICD-10-CM | POA: Diagnosis not present

## 2020-02-03 DIAGNOSIS — Z992 Dependence on renal dialysis: Secondary | ICD-10-CM | POA: Diagnosis not present

## 2020-02-03 DIAGNOSIS — E1129 Type 2 diabetes mellitus with other diabetic kidney complication: Secondary | ICD-10-CM | POA: Diagnosis not present

## 2020-02-03 NOTE — Progress Notes (Addendum)
[facility] Note  02/09/2020     CHIEF COMPLAINT Patient presents for Retina Follow Up   HISTORY OF PRESENT ILLNESS: [name] is a 69 y.o. female who presents to the clinic today for:   HPI    Retina Follow Up    Patient presents with  Diabetic Retinopathy.  In both eyes.  Duration of 10 weeks.  Since onset it is stable.  I, the attending physician,  performed the HPI with the patient and updated documentation appropriately.          Comments    10 week follow up NPDR OU- Vision appears stable OU.  Eyes itch and tear only when wearing her mask.  Uses Thera Tears but it doesn't help.  Pt had toe amputated since last visit. BS 135 this am A1C: 6.9        Last edited by [name], MD on 02/09/2020 10:18 AM. (History)    Pt states vision is improved since cataract surgery OD.  Patient states colors are brighter.  Referring physician: [name], MD [address] [place],  [place] [address]  HISTORICAL INFORMATION:   Selected notes from the MEDICAL RECORD NUMBER Referred by Dr. [name] for concern of BRVO OS LEE: 10.03.19 (C. Weaver) [BCVA: OD: 20/30 OS: 20/50] Ocular Hx-cataracts OU, HTR OU, NPDR OU, DES PMH-DM (A1P: 7.3, takes Trulicity and [place]), HTN, HLD     CURRENT MEDICATIONS: Current Outpatient Medications (Ophthalmic Drugs)  Medication Sig  . Carboxymethylcellulose Sodium (THERATEARS OP) Place 1 drop into both eyes daily as needed (dry eyes).   No current facility-administered medications for this visit. (Ophthalmic Drugs)   Current Outpatient Medications (Other)  Medication Sig  . bisacodyl (BISACODYL) 5 MG EC tablet Take 10 mg by mouth daily as needed for moderate constipation. (Patient not taking: Reported on 02/07/2020)  . Blood Glucose Monitoring Suppl (ONE TOUCH ULTRA 2) w/Device KIT Use to check BS BID-QID Dx:E11.9  . BREO ELLIPTA 100-25 MCG/INH AEPB Inhale 1 puff by mouth once daily (Patient  taking differently: Inhale 1 puff into the lungs daily. )  . diphenhydrAMINE (BENADRYL) 25 MG tablet Take 25 mg by mouth daily as needed for allergies.  Marland Kitchen gabapentin (NEURONTIN) 300 MG capsule TAKE 1 CAPSULE BY MOUTH THREE TIMES DAILY  . Insulin Pen Needle (LIVE BETTER PEN NEEDLES) 31G X 6 MM MISC To use with Tresiba pens daily  . Lancets (ONETOUCH ULTRASOFT) lancets Use as instructed  . lidocaine-prilocaine (EMLA) cream Apply 1 application topically as needed (port access).  . Methoxy PEG-Epoetin Beta (MIRCERA IJ) Mircera  . multivitamin (RENA-VIT) TABS tablet Take 1 tablet by mouth daily.  Glory Rosebush ULTRA test strip USE AS DIRECTED TO MONITOR  FSBS 3 TIMES DAILY  . oxyCODONE-acetaminophen (PERCOCET) 5-325 MG tablet Take 1 tablet by mouth every 6 (six) hours as needed for severe pain.  Marland Kitchen PROAIR HFA 108 (90 Base) MCG/ACT inhaler Inhale 2 puffs into the lungs every 6 (six) hours as needed for wheezing or shortness of breath.  . rosuvastatin (CRESTOR) 20 MG tablet Take 1 tablet (20 mg total) by mouth daily.  . sevelamer carbonate (RENVELA) 800 MG tablet Take 800 mg by mouth 2 (two) times daily with a meal.  . TRESIBA FLEXTOUCH 100 UNIT/ML FlexTouch Pen INJECT 150 UNITS SUBCUTANEOUSLY ONCE DAILY (Patient taking differently: Inject 50-150 Units into the skin daily as needed (high blood over 150). )   Current Facility-Administered Medications (Other)  Medication Route  . Bevacizumab (AVASTIN) SOLN 1.25 mg Intravitreal  . Bevacizumab (AVASTIN) SOLN 1.25 mg Intravitreal  . Bevacizumab (AVASTIN) SOLN 1.25 mg Intravitreal  . Bevacizumab (AVASTIN) SOLN 1.25 mg Intravitreal  . Bevacizumab (AVASTIN) SOLN 1.25 mg Intravitreal      REVIEW OF SYSTEMS: ROS    Positive for: Genitourinary, Endocrine, Cardiovascular, Eyes   Negative for: Constitutional, Gastrointestinal, Neurological, Skin, Musculoskeletal, HENT, Respiratory, Psychiatric, Allergic/Imm, Heme/Lymph   Last edited by Leonie Douglas, COA on  02/09/2020  9:06 AM. (History)       ALLERGIES Allergies  Allergen Reactions  . Penicillins Other (See Comments)    UNSPECIFIED CHILDHOOD REACTION  Has patient had a PCN reaction causing immediate rash, facial/tongue/throat swelling, SOB or lightheadedness with hypotension: Unknown Has patient had a PCN reaction causing severe rash involving mucus membranes or skin necrosis: Unknown Has patient had a PCN reaction that required hospitalization: Unknown Has patient had a PCN reaction occurring within the last 10 years: Unknown If all of the above answers are "NO", then may proceed with Cephalosporin use.   . Adhesive [Tape] Rash    PAST MEDICAL HISTORY Past Medical History:  Diagnosis Date  . Anemia   . Anemia associated with chronic renal failure   . Anemia in chronic kidney disease 09/29/2018  . Arthritis   . Asthma   . Cataract    OD  . Coronary artery calcification seen on CAT scan 02/17/2018   Coronary calcification on CT  . Depression   . Diabetic retinopathy of both eyes ([place])   . Diabetic ulcer of right foot associated with type 2 diabetes mellitus ([place])   . ESRD (end stage renal disease) on dialysis ([place])   . Essential hypertension 02/17/2018   Essential hypertension  . GERD (gastroesophageal reflux disease)    pt denies  . HLD (hyperlipidemia)   . Hypertension   . Hypertensive retinopathy    OU  . Left ventricular dysfunction 04/07/2018   Left ventricle dysfunction  . Microalbuminuria due to type 2 diabetes mellitus ([place])   . Neuromuscular disorder ([place])    diabetic neuropathy  . Nonproliferative retinopathy due to secondary diabetes ([place])   . Nonsustained ventricular tachycardia ([place]) 08/28/2018   Nonsustained ventricular tachycardia  . Pneumonia    hx of   . Renal cell cancer ([place])   . Right renal mass 03/10/2017  . Type 2 diabetes, controlled, with neuropathy ([place]) 06/22/2013  . Ulcer of other part of foot 06/22/2013  . Uncontrolled type II diabetes mellitus  with nephropathy [facility])    Past Surgical History:  Procedure Laterality Date  . AMPUTATION TOE Right 10/29/2019   Procedure: RIGHT 2ND TOE AMPUTATION;  Surgeon: Edrick Kins, DPM;  Location: [facility];  Service: Podiatry;  Laterality: Right;  . BASCILIC VEIN TRANSPOSITION Left 08/08/2017   Procedure: BASILIC VEIN TRANSPOSITION FIRST STAGE LEFT ARM;  Surgeon: Serafina Mitchell, MD;  Location: [facility];  Service: Vascular;  Laterality: Left;  . BASCILIC VEIN TRANSPOSITION Left 05/14/2018   Procedure: SECOND STAGE BASILIC VEIN TRANSPOSITION LEFT ARM;  Surgeon: Serafina Mitchell, MD;  Location: [facility];  Service: Vascular;  Laterality: Left;  . CATARACT EXTRACTION Left   . EYE SURGERY    . ROBOTIC ADRENALECTOMY Left 03/10/2017   Procedure: XI ROBOTIC ADRENALECTOMY;  Surgeon: Nickie Retort, MD;  Location: [facility];  Service: Urology;  Laterality: Left;  . ROBOTIC ASSITED PARTIAL NEPHRECTOMY Right 03/10/2017   Procedure: XI ROBOTIC ASSITED RADICAL NEPHRECTOMY;  Surgeon: [name]  Jeneen Rinks, MD;  Location: [facility];  Service: Urology;  Laterality: Right;  . TOE AMPUTATION Bilateral    both great toe ,, left foot 2nd toe 1/2    FAMILY HISTORY Family History  Problem Relation Age of Onset  . Heart disease Mother   . Diabetes Sister     SOCIAL HISTORY Social History   Tobacco Use  . Smoking status: Never Smoker  . Smokeless tobacco: Never Used  Vaping Use  . Vaping Use: Never used  Substance Use Topics  . Alcohol use: Yes    Alcohol/week: 0.0 standard drinks    Comment: occ  . Drug use: No         OPHTHALMIC EXAM:  Base Eye Exam    Visual Acuity (Snellen - Linear)      Right Left   Dist [place] 20/40 +1 20/30 -1   Dist ph [place] 20/25 20/25       Tonometry (Tonopen, 9:29 AM)      Right Left   Pressure 16 16       Pupils      Dark Light Shape React APD   Right 3 2 Round Brisk None   Left 3 2 Round Brisk None       Visual Fields (Counting fingers)      Left Right    Full Full        Extraocular Movement      Right Left    Full Full       Neuro/Psych    Oriented x3: Yes   Mood/Affect: Normal       Dilation    Both eyes: 1.0% Mydriacyl, 2.5% Phenylephrine @ 9:15 AM        Slit Lamp and Fundus Exam    Slit Lamp Exam      Right Left   Lids/Lashes Dermatochalasis - upper lid, mild MGD Dermatochalasis - upper lid, Telangiectasia, mild MGD   Conjunctiva/Sclera White and quiet White and quiet   Cornea 1+ Punctate epithelial erosions, mild Arcus Well-healed temporal cataract wound   Anterior Chamber Deep and quiet Deep   Iris Round and moderately dilated to 5.35m Round and well dilated   Lens PCIOL in good position PCIOL in good position   Vitreous Vitreous syneresis, Posterior vitreous detachment Vitreous syneresis       Fundus Exam      Right Left   Disc Pink and Sharp, m trace pallor, Sharp rim   C/D Ratio 0.3 0.3   Macula good foveal reflex, scattered MA and IRH greatest nasally, trace cystic changes inf/nasal macula Flat, good foveal reflex, RPE mottling and clumping scattered blot hemes greatest inferiorly, +cystic changes nasal macula   Vessels Vascular attenuation, Tortuous, AV crossing changes, blot heme along IT arcades Tortuous, Vascular attenuation, AV crossing changes   Periphery Attached, scattered IRH/DBH greatest posteriorly and small CWS nasal to disc - fading Attached, scattered DBH          IMAGING AND PROCEDURES  Imaging and Procedures for _0 @  OCT, Retina - OU - Both Eyes       Right Eye Quality was good. Central Foveal Thickness: 256. Progression has worsened. Findings include normal foveal contour, no SRF, intraretinal fluid (Interval increase in cystic changes INF/NAS macula).   Left Eye Quality was good. Central Foveal Thickness: 247. Progression has improved. Findings include normal foveal contour, intraretinal fluid, no SRF, vitreomacular adhesion , intraretinal hyper-reflective material (Persistent cystic changes  inf/nasal macula).   Notes *Images captured and  stored on drive  Diagnosis / Impression:  NFP; +IRF; no SRF OU noncentral DME OU (OS > OD) OS - Persistent cystic changes inf/nasal macula  Clinical management:  See below  Abbreviations: NFP - Normal foveal profile. CME - cystoid macular edema. PED - pigment epithelial detachment. IRF - intraretinal fluid. SRF - subretinal fluid. EZ - ellipsoid zone. ERM - epiretinal membrane. ORA - outer retinal atrophy. ORT - outer retinal tubulation. SRHM - subretinal hyper-reflective material        Intravitreal Injection, Pharmacologic Agent - OS - Left Eye       Time Out 02/09/2020. 9:49 AM. Confirmed correct patient, procedure, site, and patient consented.   Anesthesia Topical anesthesia was used. Anesthetic medications included Lidocaine 2%, Proparacaine 0.5%.   Procedure Preparation included 5% betadine to ocular surface, eyelid speculum. A supplied needle was used.   Injection:  1.25 mg Bevacizumab (AVASTIN) SOLN   NDC: 92010-071-21, Lot: [id number] , Expiration date: 03/15/2020   Route: Intravitreal, Site: Left Eye, Waste: 0 mg  Post-op Post injection exam found visual acuity of at least counting fingers. The patient tolerated the procedure well. There were no complications. The patient received written and verbal post procedure care education.                 ASSESSMENT/PLAN:    ICD-10-CM   1. Moderate nonproliferative diabetic retinopathy of both eyes with macular edema associated with type 2 diabetes mellitus (HCC)  F75.8832 Intravitreal Injection, Pharmacologic Agent - OS - Left Eye    Bevacizumab (AVASTIN) SOLN 1.25 mg  2. Retinal edema  H35.81 OCT, Retina - OU - Both Eyes  3. Essential hypertension  I10   4. Hypertensive retinopathy of both eyes  H35.033   5. Pseudophakia of both eyes  Z96.1     1,2 Moderate non-proliferative diabetic retinopathy w/ DME OU (OS > OD)  - Initial exam shows scattered MA, BCVA 20/50  OS  - Initial FA (05/12/18) shows leaking MA, no NV OS  - Initial OCT showed diabetic macular edema OU (OS > OD)   - s/p IVA #1 OS (10.22.19), #2 (11.20.19), #3 (12.20.19), #4 (01.31.20), #5 (03.13.20), #6 (04.22.20), #7 (06.05.20), #8 (07.17.20), #9 (08.28.20),#10 (10.07.20), #11 (12.04.20), #12 (02.21.21), #13 (05.12.21)  - today BCVA improved OD to 20/25 secondary to recent CE/IOL OD.  BCVA stable OS at 20/25  - OCT - mild interval increase in non-central cystic changes OS  - recommend IVA #14 OS today, 07.21.21   - discussed possible need for IVA OD in the future  - pt wishes to proceed IVA OS  - RBA of procedure discussed, questions answered  - informed consent obtained  - Avastin informed consent form signed and scanned on 12.04.2020  - see procedure note  - f/u in 8 wks, DFE, OCT, possible injxn(s)  3,4. Hypertensive retinopathy OU  - discussed importance of tight BP control  - monitor  5. Pseudophakia OU  - s/p CE/IOL OU w/ Dr. [name] (OS 2020;  OD 5.19.21)  - beautiful surgeries, doing well  - monitor   Ophthalmic Meds Ordered this visit:  Meds ordered this encounter  Medications  . Bevacizumab (AVASTIN) SOLN 1.25 mg       Return in about 8 weeks (around 04/05/2020) for 8 weeks, NPDR OU, DFE, OCT.  There are no Patient Instructions on file for this visit.   Explained the diagnoses, plan, and follow up with the patient and they expressed understanding.  Patient expressed understanding of the importance  of proper follow up care.   This document serves as a record of services personally performed by Gardiner Sleeper, MD, PhD. It was created on their behalf by Roselee Nova, COMT. The creation of this record is the provider's dictation and/or activities during the visit.  Electronically signed by: Roselee Nova, COMT 02/10/20 1:33 PM   Gardiner Sleeper, M.D., Ph.D. Diseases & Surgery of the Retina and Vitreous [facility]  I have reviewed the  above documentation for accuracy and completeness, and I agree with the above. Gardiner Sleeper, M.D., Ph.D. 02/10/20 1:33 PM   Abbreviations: M myopia (nearsighted); A astigmatism; H hyperopia (farsighted); P presbyopia; Mrx spectacle prescription;  CTL contact lenses; OD right eye; OS left eye; OU both eyes  XT exotropia; ET esotropia; PEK punctate epithelial keratitis; PEE punctate epithelial erosions; DES dry eye syndrome; MGD meibomian gland dysfunction; ATs artificial tears; PFAT's preservative free artificial tears; [place] nuclear sclerotic cataract; PSC posterior subcapsular cataract; ERM epi-retinal membrane; PVD posterior vitreous detachment; RD retinal detachment; DM diabetes mellitus; DR diabetic retinopathy; NPDR non-proliferative diabetic retinopathy; PDR proliferative diabetic retinopathy; CSME clinically significant macular edema; DME diabetic macular edema; dbh dot blot hemorrhages; CWS cotton wool spot; POAG primary open angle glaucoma; C/D cup-to-disc ratio; HVF humphrey visual field; GVF goldmann visual field; OCT optical coherence tomography; IOP intraocular pressure; BRVO Branch retinal vein occlusion; CRVO central retinal vein occlusion; CRAO central retinal artery occlusion; BRAO branch retinal artery occlusion; RT retinal tear; SB scleral buckle; PPV pars plana vitrectomy; VH Vitreous hemorrhage; PRP panretinal laser photocoagulation; IVK intravitreal kenalog; VMT vitreomacular traction; MH Macular hole;  NVD neovascularization of the disc; NVE neovascularization elsewhere; AREDS age related eye disease study; ARMD age related macular degeneration; POAG primary open angle glaucoma; EBMD epithelial/anterior basement membrane dystrophy; ACIOL anterior chamber intraocular lens; IOL intraocular lens; PCIOL posterior chamber intraocular lens; Phaco/IOL phacoemulsification with intraocular lens placement; [place] photorefractive keratectomy; LASIK laser assisted in situ keratomileusis; HTN  hypertension; DM diabetes mellitus; COPD chronic obstructive pulmonary disease

## 2020-02-04 ENCOUNTER — Other Ambulatory Visit: Payer: Self-pay

## 2020-02-04 ENCOUNTER — Other Ambulatory Visit: Payer: Medicare Other | Admitting: Orthotics

## 2020-02-05 DIAGNOSIS — D689 Coagulation defect, unspecified: Secondary | ICD-10-CM | POA: Diagnosis not present

## 2020-02-05 DIAGNOSIS — D509 Iron deficiency anemia, unspecified: Secondary | ICD-10-CM | POA: Diagnosis not present

## 2020-02-05 DIAGNOSIS — N2581 Secondary hyperparathyroidism of renal origin: Secondary | ICD-10-CM | POA: Diagnosis not present

## 2020-02-05 DIAGNOSIS — N186 End stage renal disease: Secondary | ICD-10-CM | POA: Diagnosis not present

## 2020-02-05 DIAGNOSIS — Z992 Dependence on renal dialysis: Secondary | ICD-10-CM | POA: Diagnosis not present

## 2020-02-05 DIAGNOSIS — D631 Anemia in chronic kidney disease: Secondary | ICD-10-CM | POA: Diagnosis not present

## 2020-02-05 DIAGNOSIS — E1129 Type 2 diabetes mellitus with other diabetic kidney complication: Secondary | ICD-10-CM | POA: Diagnosis not present

## 2020-02-07 ENCOUNTER — Ambulatory Visit (INDEPENDENT_AMBULATORY_CARE_PROVIDER_SITE_OTHER): Payer: Medicare Other | Admitting: Family Medicine

## 2020-02-07 ENCOUNTER — Encounter: Payer: Self-pay | Admitting: Family Medicine

## 2020-02-07 ENCOUNTER — Other Ambulatory Visit: Payer: Self-pay

## 2020-02-07 VITALS — BP 102/52 | HR 83 | Temp 97.8°F | Ht 67.0 in | Wt 202.0 lb

## 2020-02-07 DIAGNOSIS — E7800 Pure hypercholesterolemia, unspecified: Secondary | ICD-10-CM | POA: Diagnosis not present

## 2020-02-07 DIAGNOSIS — Z794 Long term (current) use of insulin: Secondary | ICD-10-CM

## 2020-02-07 DIAGNOSIS — E119 Type 2 diabetes mellitus without complications: Secondary | ICD-10-CM | POA: Diagnosis not present

## 2020-02-07 DIAGNOSIS — N185 Chronic kidney disease, stage 5: Secondary | ICD-10-CM | POA: Diagnosis not present

## 2020-02-07 DIAGNOSIS — D631 Anemia in chronic kidney disease: Secondary | ICD-10-CM

## 2020-02-07 DIAGNOSIS — Z992 Dependence on renal dialysis: Secondary | ICD-10-CM

## 2020-02-07 DIAGNOSIS — N186 End stage renal disease: Secondary | ICD-10-CM

## 2020-02-07 DIAGNOSIS — I10 Essential (primary) hypertension: Secondary | ICD-10-CM | POA: Diagnosis not present

## 2020-02-07 NOTE — Progress Notes (Signed)
Subjective:    Patient ID: [name], female    DOB: 03/22/1951, 69 y.o.   MRN: [id number]  Medication Refill  Patient has a history of stage V chronic kidney disease after having a resection of a renal cell carcinoma.  She is now on dialysis.  She also has IDDM.  She is taking [place].  At her visit in October 7824, we stopped trulicity.  Shortly thereafter, the patient started dialysis.  She is now off Tradjenta as well as Trulicity.  She uses [place] occasionally.  She states that she checks her blood sugar every morning.  If her blood sugar is less than 130 she does not administer any insulin.  She will administer different doses of Tresiba based on her blood sugar.  For instance if her blood sugar is greater than 200 she will take 80 units.  If is less than 200 she will take varying amounts of Tresiba depending on the value.  She does not have a consistent pattern for how much she takes.  She "eyeballs it".  She denies hypoglycemic episodes.  She denies any polydipsia or blurry vision.  She denies any chest pain shortness of breath or dyspnea on exertion.  She continues to refuse a Covid vaccination Past Medical History:  Diagnosis Date  . Anemia   . Anemia associated with chronic renal failure   . Anemia in chronic kidney disease 09/29/2018  . Arthritis   . Asthma   . Cataract    OD  . Coronary artery calcification seen on CAT scan 02/17/2018   Coronary calcification on CT  . Depression   . Diabetic retinopathy of both eyes ([place])   . Diabetic ulcer of right foot associated with type 2 diabetes mellitus ([place])   . ESRD (end stage renal disease) on dialysis ([place])   . Essential hypertension 02/17/2018   Essential hypertension  . GERD (gastroesophageal reflux disease)    pt denies  . HLD (hyperlipidemia)   . Hypertension   . Hypertensive retinopathy    OU  . Left ventricular dysfunction 04/07/2018   Left ventricle dysfunction  . Microalbuminuria due to type 2 diabetes mellitus  ([place])   . Neuromuscular disorder ([place])    diabetic neuropathy  . Nonproliferative retinopathy due to secondary diabetes ([place])   . Nonsustained ventricular tachycardia ([place]) 08/28/2018   Nonsustained ventricular tachycardia  . Pneumonia    hx of   . Renal cell cancer ([place])   . Right renal mass 03/10/2017  . Type 2 diabetes, controlled, with neuropathy ([place]) 06/22/2013  . Ulcer of other part of foot 06/22/2013  . 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 AMPUTATION TOE Right 10/29/2019   Procedure: RIGHT 2ND TOE AMPUTATION;  Surgeon: Edrick Kins, DPM;  Location: [facility];  Service: Podiatry;  Laterality: Right;  . BASCILIC VEIN TRANSPOSITION Left 08/08/2017   Procedure: BASILIC VEIN TRANSPOSITION FIRST STAGE LEFT ARM;  Surgeon: Serafina Mitchell, MD;  Location: [place];  Service: Vascular;  Laterality: Left;  . BASCILIC VEIN TRANSPOSITION Left 05/14/2018   Procedure: SECOND STAGE BASILIC VEIN TRANSPOSITION LEFT ARM;  Surgeon: Serafina Mitchell, MD;  Location: [place];  Service: Vascular;  Laterality: Left;  . CATARACT EXTRACTION Left   . EYE SURGERY    . ROBOTIC ADRENALECTOMY Left 03/10/2017   Procedure: XI ROBOTIC ADRENALECTOMY;  Surgeon: Nickie Retort, MD;  Location: [facility];  Service: Urology;  Laterality:  Left;  . ROBOTIC ASSITED PARTIAL NEPHRECTOMY Right 03/10/2017   Procedure: XI ROBOTIC ASSITED RADICAL NEPHRECTOMY;  Surgeon: Nickie Retort, MD;  Location: [facility];  Service: Urology;  Laterality: Right;  . TOE AMPUTATION Bilateral    both great toe ,, left foot 2nd toe 1/2   Current Outpatient Medications on File Prior to Visit  Medication Sig Dispense Refill  . bisacodyl (BISACODYL) 5 MG EC tablet Take 10 mg by mouth daily as needed for moderate constipation.    . Blood Glucose Monitoring Suppl (ONE TOUCH ULTRA 2) w/Device KIT Use to check BS BID-QID Dx:E11.9 1 each 1  . BREO ELLIPTA 100-25 MCG/INH AEPB  Inhale 1 puff by mouth once daily (Patient taking differently: Inhale 1 puff into the lungs daily. ) 180 each 2  . Carboxymethylcellulose Sodium (THERATEARS OP) Place 1 drop into both eyes daily as needed (dry eyes).    . diphenhydrAMINE (BENADRYL) 25 MG tablet Take 25 mg by mouth daily as needed for allergies.    Marland Kitchen gabapentin (NEURONTIN) 300 MG capsule TAKE 1 CAPSULE BY MOUTH THREE TIMES DAILY 90 capsule 0  . Insulin Pen Needle (LIVE BETTER PEN NEEDLES) 31G X 6 MM MISC To use with Tresiba pens daily 100 each 3  . Lancets (ONETOUCH ULTRASOFT) lancets Use as instructed 300 each 3  . lidocaine-prilocaine (EMLA) cream Apply 1 application topically as needed (port access).    . Methoxy PEG-Epoetin Beta (MIRCERA IJ) Mircera    . multivitamin (RENA-VIT) TABS tablet Take 1 tablet by mouth daily.    Glory Rosebush ULTRA test strip USE AS DIRECTED TO MONITOR  FSBS 3 TIMES DAILY 300 strip 3  . oxyCODONE-acetaminophen (PERCOCET) 5-325 MG tablet Take 1 tablet by mouth every 6 (six) hours as needed for severe pain. 30 tablet 0  . PROAIR HFA 108 (90 Base) MCG/ACT inhaler Inhale 2 puffs into the lungs every 6 (six) hours as needed for wheezing or shortness of breath. 9 g 3  . rosuvastatin (CRESTOR) 20 MG tablet Take 1 tablet (20 mg total) by mouth daily. 90 tablet 3  . sevelamer carbonate (RENVELA) 800 MG tablet Take 800 mg by mouth 2 (two) times daily with a meal.    . sulfamethoxazole-trimethoprim (BACTRIM DS) 800-160 MG tablet Take 1 tablet by mouth 2 (two) times daily. 20 tablet 0  . TRESIBA FLEXTOUCH 100 UNIT/ML FlexTouch Pen INJECT 150 UNITS SUBCUTANEOUSLY ONCE DAILY (Patient taking differently: Inject 50-150 Units into the skin daily as needed (high blood over 150). ) 45 mL 0   Current Facility-Administered Medications on File Prior to Visit  Medication Dose Route Frequency Provider Last Rate Last Admin  . Bevacizumab (AVASTIN) SOLN 1.25 mg  1.25 mg Intravitreal  [name], MD   1.25 mg at 05/12/18 1319    . Bevacizumab (AVASTIN) SOLN 1.25 mg  1.25 mg Intravitreal  [name], MD   1.25 mg at 06/13/18 0055  . Bevacizumab (AVASTIN) SOLN 1.25 mg  1.25 mg Intravitreal  [name], MD   1.25 mg at 07/10/18 1308  . Bevacizumab (AVASTIN) SOLN 1.25 mg  1.25 mg Intravitreal  [name], MD   1.25 mg at 08/21/18 1312  . Bevacizumab (AVASTIN) SOLN 1.25 mg  1.25 mg Intravitreal  [name], MD   1.25 mg at 10/02/18 1510   Allergies  Allergen Reactions  . Penicillins Other (See Comments)    UNSPECIFIED CHILDHOOD REACTION  Has patient had a PCN reaction causing immediate rash, facial/tongue/throat swelling, SOB or lightheadedness with  hypotension: Unknown Has patient had a PCN reaction causing severe rash involving mucus membranes or skin necrosis: Unknown Has patient had a PCN reaction that required hospitalization: Unknown Has patient had a PCN reaction occurring within the last 10 years: Unknown If all of the above answers are "NO", then may proceed with Cephalosporin use.   . Adhesive [Tape] Rash   Social History   Socioeconomic History  . Marital status: Single    Spouse name: Not on file  . Number of children: 2  . Years of education: 21  . Highest education level: Not on file  Occupational History  . Not on file  Tobacco Use  . Smoking status: Never Smoker  . Smokeless tobacco: Never Used  Vaping Use  . Vaping Use: Never used  Substance and Sexual Activity  . Alcohol use: Yes    Alcohol/week: 0.0 standard drinks    Comment: occ  . Drug use: No  . Sexual activity: Not Currently    Partners: Male  Other Topics Concern  . Not on file  Social History Narrative  . Not on file   Social Determinants of Health   Financial Resource Strain:   . Difficulty of Paying Living Expenses:   Food Insecurity: No Food Insecurity  . Worried About Charity fundraiser in the Last Year: Never true  . Ran Out of Food in the Last Year: Never true  Transportation Needs: No  Transportation Needs  . Lack of Transportation (Medical): No  . Lack of Transportation (Non-Medical): No  Physical Activity:   . Days of Exercise per Week:   . Minutes of Exercise per Session:   Stress:   . Feeling of Stress :   Social Connections:   . Frequency of Communication with Friends and Family:   . Frequency of Social Gatherings with Friends and Family:   . Attends Religious Services:   . Active Member of Clubs or Organizations:   . Attends Archivist Meetings:   Marland Kitchen Marital Status:   Intimate Partner Violence:   . Fear of Current or Ex-Partner:   . Emotionally Abused:   Marland Kitchen Physically Abused:   . Sexually Abused:      Review of Systems  All other systems reviewed and are negative.      Objective:   Physical Exam Vitals reviewed.  Constitutional:      General: She is not in acute distress.    Appearance: She is well-developed. She is not diaphoretic.  Cardiovascular:     Rate and Rhythm: Normal rate and regular rhythm.     Heart sounds: Normal heart sounds.  Pulmonary:     Effort: Pulmonary effort is normal. No respiratory distress.     Breath sounds: Normal breath sounds. No wheezing or rales.  Abdominal:     General: Bowel sounds are normal. There is no distension.     Palpations: Abdomen is soft.     Tenderness: There is no abdominal tenderness. There is no guarding or rebound.           Assessment & Plan:    Anemia in stage 5 chronic kidney disease, not on chronic dialysis ([place]) - Plan: Hemoglobin A1c, CBC with Differential/Platelet, COMPLETE METABOLIC PANEL WITH GFR, Lipid panel  Essential hypertension - Plan: Hemoglobin A1c, CBC with Differential/Platelet, COMPLETE METABOLIC PANEL WITH GFR, Lipid panel  ESRD (end stage renal disease) on dialysis ([place]) - Plan: Hemoglobin A1c, CBC with Differential/Platelet, COMPLETE METABOLIC PANEL WITH GFR, Lipid panel  Diabetes mellitus,  type II, insulin dependent ([place]) - Plan: Hemoglobin A1c, CBC with  Differential/Platelet, COMPLETE METABOLIC PANEL WITH GFR, Lipid panel  Pure hypercholesterolemia  Diabetic foot exam is markedly abnormal today.  There is a blister forming over the medial aspect of her right first MTP joint where she has an amputation.  I covered this with a bandage.  She has an appointment to see the wound clinic on Wednesday.  She is seeing a foot doctor in a wound clinic regularly.  She is not wearing her diabetic foot wear but insists on wearing crocs.  I will check her hemoglobin A1c today.  I would like to give the patient a sliding scale to administer her insulin by rather than have her give [place] on an amount as I think this puts her at high risk for hypoglycemic episodes.  Await to see the results of her lab work to determine the appropriate sliding scale.  Strongly strongly strongly encouraged the Covid vaccination

## 2020-02-08 DIAGNOSIS — N2581 Secondary hyperparathyroidism of renal origin: Secondary | ICD-10-CM | POA: Diagnosis not present

## 2020-02-08 DIAGNOSIS — N186 End stage renal disease: Secondary | ICD-10-CM | POA: Diagnosis not present

## 2020-02-08 DIAGNOSIS — D509 Iron deficiency anemia, unspecified: Secondary | ICD-10-CM | POA: Diagnosis not present

## 2020-02-08 DIAGNOSIS — D631 Anemia in chronic kidney disease: Secondary | ICD-10-CM | POA: Diagnosis not present

## 2020-02-08 DIAGNOSIS — D689 Coagulation defect, unspecified: Secondary | ICD-10-CM | POA: Diagnosis not present

## 2020-02-08 DIAGNOSIS — Z992 Dependence on renal dialysis: Secondary | ICD-10-CM | POA: Diagnosis not present

## 2020-02-08 DIAGNOSIS — E1129 Type 2 diabetes mellitus with other diabetic kidney complication: Secondary | ICD-10-CM | POA: Diagnosis not present

## 2020-02-08 LAB — CBC WITH DIFFERENTIAL/PLATELET
Absolute Monocytes: 409 cells/uL (ref 200–950)
Basophils Absolute: 37 cells/uL (ref 0–200)
Basophils Relative: 0.5 %
Eosinophils Absolute: 153 cells/uL (ref 15–500)
Eosinophils Relative: 2.1 %
HCT: 29.7 % — ABNORMAL LOW (ref 35.0–45.0)
Hemoglobin: 9.9 g/dL — ABNORMAL LOW (ref 11.7–15.5)
Lymphs Abs: 1562 cells/uL (ref 850–3900)
MCH: 34 pg — ABNORMAL HIGH (ref 27.0–33.0)
MCHC: 33.3 g/dL (ref 32.0–36.0)
MCV: 102.1 fL — ABNORMAL HIGH (ref 80.0–100.0)
MPV: 10.5 fL (ref 7.5–12.5)
Monocytes Relative: 5.6 %
Neutro Abs: 5139 cells/uL (ref 1500–7800)
Neutrophils Relative %: 70.4 %
Platelets: 213 10*3/uL (ref 140–400)
RBC: 2.91 10*6/uL — ABNORMAL LOW (ref 3.80–5.10)
RDW: 13 % (ref 11.0–15.0)
Total Lymphocyte: 21.4 %
WBC: 7.3 10*3/uL (ref 3.8–10.8)

## 2020-02-08 LAB — LIPID PANEL
Cholesterol: 119 mg/dL (ref <200)
HDL: 33 mg/dL — ABNORMAL LOW (ref >=50)
LDL Cholesterol (Calc): 68 mg/dL (calc)
Non-HDL Cholesterol (Calc): 86 mg/dL (calc) (ref <130)
Total CHOL/HDL Ratio: 3.6 (calc) (ref <5.0)
Triglycerides: 101 mg/dL (ref <150)

## 2020-02-08 LAB — COMPLETE METABOLIC PANEL WITH GFR
AG Ratio: 1 (calc) (ref 1.0–2.5)
ALT: 7 U/L (ref 6–29)
AST: 11 U/L (ref 10–35)
Albumin: 3.5 g/dL — ABNORMAL LOW (ref 3.6–5.1)
Alkaline phosphatase (APISO): 59 U/L (ref 37–153)
BUN/Creatinine Ratio: 4 (calc) — ABNORMAL LOW (ref 6–22)
BUN: 27 mg/dL — ABNORMAL HIGH (ref 7–25)
CO2: 32 mmol/L (ref 20–32)
Calcium: 8.7 mg/dL (ref 8.6–10.4)
Chloride: 99 mmol/L (ref 98–110)
Creat: 6.19 mg/dL — ABNORMAL HIGH (ref 0.50–0.99)
GFR, Est African American: 7 mL/min/{1.73_m2} — ABNORMAL LOW (ref >=60)
GFR, Est Non African American: 6 mL/min/{1.73_m2} — ABNORMAL LOW (ref >=60)
Globulin: 3.4 g/dL (calc) (ref 1.9–3.7)
Glucose, Bld: 102 mg/dL — ABNORMAL HIGH (ref 65–99)
Potassium: 5.2 mmol/L (ref 3.5–5.3)
Sodium: 143 mmol/L (ref 135–146)
Total Bilirubin: 0.7 mg/dL (ref 0.2–1.2)
Total Protein: 6.9 g/dL (ref 6.1–8.1)

## 2020-02-08 LAB — HEMOGLOBIN A1C
Hgb A1c MFr Bld: 6.9 % of total Hgb — ABNORMAL HIGH (ref <5.7)
Mean Plasma Glucose: 151 (calc)
eAG (mmol/L): 8.4 (calc)

## 2020-02-09 ENCOUNTER — Other Ambulatory Visit: Payer: Self-pay

## 2020-02-09 ENCOUNTER — Ambulatory Visit (INDEPENDENT_AMBULATORY_CARE_PROVIDER_SITE_OTHER): Payer: Medicare Other | Admitting: Ophthalmology

## 2020-02-09 ENCOUNTER — Encounter (INDEPENDENT_AMBULATORY_CARE_PROVIDER_SITE_OTHER): Payer: Self-pay | Admitting: Ophthalmology

## 2020-02-09 ENCOUNTER — Encounter (HOSPITAL_BASED_OUTPATIENT_CLINIC_OR_DEPARTMENT_OTHER): Payer: Medicare Other | Attending: Physician Assistant | Admitting: Physician Assistant

## 2020-02-09 DIAGNOSIS — E113313 Type 2 diabetes mellitus with moderate nonproliferative diabetic retinopathy with macular edema, bilateral: Secondary | ICD-10-CM | POA: Diagnosis not present

## 2020-02-09 DIAGNOSIS — L97822 Non-pressure chronic ulcer of other part of left lower leg with fat layer exposed: Secondary | ICD-10-CM | POA: Insufficient documentation

## 2020-02-09 DIAGNOSIS — L97312 Non-pressure chronic ulcer of right ankle with fat layer exposed: Secondary | ICD-10-CM | POA: Insufficient documentation

## 2020-02-09 DIAGNOSIS — H35033 Hypertensive retinopathy, bilateral: Secondary | ICD-10-CM | POA: Diagnosis not present

## 2020-02-09 DIAGNOSIS — I120 Hypertensive chronic kidney disease with stage 5 chronic kidney disease or end stage renal disease: Secondary | ICD-10-CM | POA: Insufficient documentation

## 2020-02-09 DIAGNOSIS — E1122 Type 2 diabetes mellitus with diabetic chronic kidney disease: Secondary | ICD-10-CM | POA: Diagnosis not present

## 2020-02-09 DIAGNOSIS — I10 Essential (primary) hypertension: Secondary | ICD-10-CM

## 2020-02-09 DIAGNOSIS — L97812 Non-pressure chronic ulcer of other part of right lower leg with fat layer exposed: Secondary | ICD-10-CM | POA: Diagnosis not present

## 2020-02-09 DIAGNOSIS — Z961 Presence of intraocular lens: Secondary | ICD-10-CM | POA: Diagnosis not present

## 2020-02-09 DIAGNOSIS — Z992 Dependence on renal dialysis: Secondary | ICD-10-CM | POA: Insufficient documentation

## 2020-02-09 DIAGNOSIS — L97512 Non-pressure chronic ulcer of other part of right foot with fat layer exposed: Secondary | ICD-10-CM | POA: Insufficient documentation

## 2020-02-09 DIAGNOSIS — H3581 Retinal edema: Secondary | ICD-10-CM | POA: Diagnosis not present

## 2020-02-09 DIAGNOSIS — E11621 Type 2 diabetes mellitus with foot ulcer: Secondary | ICD-10-CM | POA: Insufficient documentation

## 2020-02-09 DIAGNOSIS — N186 End stage renal disease: Secondary | ICD-10-CM | POA: Insufficient documentation

## 2020-02-09 DIAGNOSIS — I872 Venous insufficiency (chronic) (peripheral): Secondary | ICD-10-CM | POA: Insufficient documentation

## 2020-02-09 MED ORDER — BEVACIZUMAB CHEMO INJECTION 1.25MG/0.05ML SYRINGE FOR KALEIDOSCOPE
1.2500 mg | INTRAVITREAL | Status: AC | PRN
  Administered 2020-02-09: 1.25 mg via INTRAVITREAL

## 2020-02-09 NOTE — Progress Notes (Signed)
ZOLA, RUNION ([id number]) Visit Report for 02/09/2020 Abuse/Suicide Risk Screen Details Patient Name: Date of Service: [name], [name] 02/09/2020 1:15 PM Medical Record Number: [id number] Patient Account Number: [id number] Date of Birth/Sex: Treating RN: 1951/07/11 (69 y.o. Clearnce Sorrel Primary Care Aydeen Blume: [name] RD, [name] Other Clinician: Referring Zona Pedro: Treating Pegah Segel/Extender: [name] RD, [name] Weeks in Treatment: 0 Abuse/Suicide Risk Screen Items Answer ABUSE RISK SCREEN: Has anyone close to you tried to hurt or harm you recentlyo No Do you feel uncomfortable with anyone in your familyo No Has anyone forced you do things that you didnt want to doo No Electronic Signature(s) Signed: 02/09/2020 5:52:13 PM By: Kela Millin Entered By: Kela Millin on 02/09/2020 13:38:36 -------------------------------------------------------------------------------- Activities of Daily Living Details Patient Name: Date of Service: [name], [name] 02/09/2020 1:15 PM Medical Record Number: [id number] Patient Account Number: [id number] Date of Birth/Sex: Treating RN: 1950/11/27 (69 y.o. Clearnce Sorrel Primary Care Shaquel Chavous: [place], [name] Other Clinician: Referring Akeyla Molden: Treating Cordae Mccarey/Extender: [name] RD, [name] Weeks in Treatment: 0 Activities of Daily Living Items Answer Activities of Daily Living (Please select one for each item) Drive Automobile Not Able T Medications ake Completely Able Use T elephone Completely Able Care for Appearance Completely Able Use T oilet Completely Able Bath / Shower Completely Able Dress Self Completely Able Feed Self Completely Able Walk Need Assistance Get In / Out Bed Completely Able Housework Completely Able Prepare Meals Completely [place] for Self Completely Able Electronic Signature(s) Signed: 02/09/2020 5:52:13 PM By: Kela Millin Entered By: Kela Millin on 02/09/2020 13:39:04 -------------------------------------------------------------------------------- Education Screening Details Patient Name: Date of Service: [name], [place] [name] 02/09/2020 1:15 PM Medical Record Number: [id number] Patient Account Number: [id number] Date of Birth/Sex: Treating RN: 08/14/1950 (69 y.o. Clearnce Sorrel Primary Care Enzley Kitchens: [name] RD, [name] Other Clinician: Referring Mabry Santarelli: Treating Samul Mcinroy/Extender: [name] RD, [name] Weeks in Treatment: 0 Primary Learner Assessed: Patient Learning Preferences/Education Level/Primary Language Learning Preference: Explanation Highest Education Level: High School Preferred Language: English Cognitive Barrier Language Barrier: No Translator Needed: No Memory Deficit: No Emotional Barrier: No Cultural/Religious Beliefs Affecting Medical Care: No Physical Barrier Impaired Vision: Yes Impaired Hearing: No Decreased Hand dexterity: No Knowledge/Comprehension Knowledge Level: Medium Comprehension Level: Medium Ability to understand written instructions: Medium Ability to understand verbal instructions: Medium Motivation Anxiety Level: Calm Cooperation: Cooperative Education Importance: Acknowledges Need Interest in Health Problems: Asks Questions Perception: Coherent Willingness to Engage in Self-Management High Activities: Readiness to Engage in Self-Management High Activities: Electronic Signature(s) Signed: 02/09/2020 5:52:13 PM By: Kela Millin Entered By: Kela Millin on 02/09/2020 13:39:29 -------------------------------------------------------------------------------- Fall Risk Assessment Details Patient Name: Date of Service: [name], [place] [name] 02/09/2020 1:15 PM Medical Record Number: [id number] Patient Account Number: [id number] Date of Birth/Sex: Treating RN: 02/20/1951 (69 y.o. Clearnce Sorrel Primary Care Keyara Ent:  [name] RD, [name] Other Clinician: Referring Neil Errickson: Treating Dequincy Born/Extender: [name] RD, [name] Weeks in Treatment: 0 Fall Risk Assessment Items Have you had 2 or more falls in the last 12 monthso 0 Yes Have you had any fall that resulted in injury in the last 12 monthso 0 No FALLS RISK SCREEN History of falling - immediate or within 3 months 25 Yes Secondary diagnosis (Do you have 2 or more medical diagnoseso) 15 Yes Ambulatory aid None/bed rest/wheelchair/nurse 0 No Crutches/cane/walker 15 Yes Furniture 0 No Intravenous therapy  Access/Saline/Heparin Lock 0 No Gait/Transferring Normal/ bed rest/ wheelchair 0 No Weak (short steps with or without shuffle, stooped but able to lift head while walking, may seek 10 Yes support from furniture) Impaired (short steps with shuffle, may have difficulty arising from chair, head down, impaired 0 No balance) Mental Status Oriented to own ability 0 Yes Electronic Signature(s) Signed: 02/09/2020 5:52:13 PM By: Kela Millin Entered By: Kela Millin on 02/09/2020 13:39:56 -------------------------------------------------------------------------------- Foot Assessment Details Patient Name: Date of Service: [name], [place] [name] 02/09/2020 1:15 PM Medical Record Number: [id number] Patient Account Number: [id number] Date of Birth/Sex: Treating RN: [birth year] (69 y.o. Clearnce Sorrel Primary Care Annelyse Rey: [name] RD, [name] Other Clinician: Referring Charletta Voight: Treating Camie Hauss/Extender: [name] RD, [name] Weeks in Treatment: 0 Foot Assessment Items Site Locations + = Sensation present, - = Sensation absent, C = Callus, U = Ulcer R = Redness, W = Warmth, M = Maceration, PU = Pre-ulcerative lesion F = Fissure, S = Swelling, D = Dryness Assessment Right: Left: Other Deformity: No No Prior Foot Ulcer: No No Prior Amputation: Yes Yes Charcot Joint: No No Ambulatory Status: Ambulatory With  Help Assistance Device: Cane Gait: Buyer, retail Signature(s) Signed: 02/09/2020 5:52:13 PM By: Kela Millin Entered By: Kela Millin on 02/09/2020 13:41:28 -------------------------------------------------------------------------------- Nutrition Risk Screening Details Patient Name: Date of Service: [name], [place] [name] 02/09/2020 1:15 PM Medical Record Number: [id number] Patient Account Number: [id number] Date of Birth/Sex: Treating RN: 10/13/1950 (69 y.o. Clearnce Sorrel Primary Care Hung Rhinesmith: [name] RD, [place] Other Clinician: Referring Ailin Rochford: Treating Coleman Kalas/Extender: [name] RD, [name] Weeks in Treatment: 0 Height (in): 67 Weight (lbs): 202 Body Mass Index (BMI): 31.6 Nutrition Risk Screening Items Score Screening NUTRITION RISK SCREEN: I have an illness or condition that made me change the kind and/or amount of food I eat 0 No I eat fewer than two meals per day 0 No I eat few fruits and vegetables, or milk products 0 No I have three or more drinks of beer, liquor or wine almost every day 0 No I have tooth or mouth problems that make it hard for me to eat 0 No I don't always have enough money to buy the food I need 0 No I eat alone most of the time 0 No I take three or more different prescribed or over-the-counter drugs a day 1 Yes Without wanting to, I have lost or gained 10 pounds in the last six months 0 No I am not always physically able to shop, cook and/or feed myself 0 No Nutrition Protocols Good Risk Protocol Moderate Risk Protocol 0 Provide education on nutrition High Risk Proctocol Risk Level: Good Risk Score: 1 Electronic Signature(s) Signed: 02/09/2020 5:52:13 PM By: Kela Millin Entered By: Kela Millin on 02/09/2020 13:40:11

## 2020-02-10 DIAGNOSIS — D509 Iron deficiency anemia, unspecified: Secondary | ICD-10-CM | POA: Diagnosis not present

## 2020-02-10 DIAGNOSIS — D689 Coagulation defect, unspecified: Secondary | ICD-10-CM | POA: Diagnosis not present

## 2020-02-10 DIAGNOSIS — N2581 Secondary hyperparathyroidism of renal origin: Secondary | ICD-10-CM | POA: Diagnosis not present

## 2020-02-10 DIAGNOSIS — N186 End stage renal disease: Secondary | ICD-10-CM | POA: Diagnosis not present

## 2020-02-10 DIAGNOSIS — E1129 Type 2 diabetes mellitus with other diabetic kidney complication: Secondary | ICD-10-CM | POA: Diagnosis not present

## 2020-02-10 DIAGNOSIS — D631 Anemia in chronic kidney disease: Secondary | ICD-10-CM | POA: Diagnosis not present

## 2020-02-10 DIAGNOSIS — Z992 Dependence on renal dialysis: Secondary | ICD-10-CM | POA: Diagnosis not present

## 2020-02-10 NOTE — Progress Notes (Signed)
EMAYA, PRESTON ([id number]) Visit Report for 02/09/2020 Chief Complaint Document Details Patient Name: Date of Service: [name], [name] 02/09/2020 1:15 PM Medical Record Number: [id number] Patient Account Number: [id number] Date of Birth/Sex: Treating RN: 05-20-1951 (69 y.o. [name] Primary Care Provider: [name] RD, [name] Other Clinician: Referring Provider: Treating Provider/Extender: [name] RD, [name] Weeks in Treatment: 0 Information Obtained from: Patient Chief Complaint Multiple LE Ulcers Electronic Signature(s) Signed: 02/09/2020 2:36:37 PM By: [name] PA-C Entered By: [name] on 02/09/2020 14:36:37 -------------------------------------------------------------------------------- HPI Details Patient Name: Date of Service: [name], [place] [name] 02/09/2020 1:15 PM Medical Record Number: [id number] Patient Account Number: [id number] Date of Birth/Sex: Treating RN: 07-11-1951 (69 y.o. [name] Primary Care Provider: [name] RD, [name] Other Clinician: Referring Provider: Treating Provider/Extender: [name] RD, [name] Weeks in Treatment: 0 History of Present Illness HPI Description: Evaluate7/21/2021 today patient presents for evaluation of ulcerations on her legs which she tells me tend to come and go as far as small blistering locations and sometimes are better than other times. Right now she tells me that this is actually doing a little bit better but nonetheless will not completely go away. She has ulcerations bilaterally on her lower extremities. The right is worse than the left. 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 The patient has no evidence of active infection at this time. She however appears to have proficient blood flow into the extremities and I think would tolerate a 3 layer compression wrap  she was noncompressible on arterial studies. Nonetheless there was no obvious signs of occlusion. Electronic Signature(s) Signed: 02/09/2020 6:52:02 PM By: [name] PA-C Entered By: [name] on 02/09/2020 18:52:02 -------------------------------------------------------------------------------- Physical Exam Details Patient Name: Date of Service: [facility] [name], [place] [name] 02/09/2020 1:15 PM Medical Record Number: [id number] Patient Account Number: [id number] Date of Birth/Sex: Treating RN: 10-Jan-1951 (69 y.o. [name] Primary Care Provider: [name] RD, [name] Other Clinician: Referring Provider: Treating Provider/Extender: [name] RD, [name] Weeks in Treatment: 0 Constitutional sitting or standing blood pressure is within target range for patient.. pulse regular and within target range for patient.Marland Kitchen respirations regular, non-labored and within target range for patient.Marland Kitchen temperature within target range for patient.. Well-nourished and well-hydrated in no acute distress. Eyes conjunctiva clear no eyelid edema noted. pupils equal round and reactive to light and accommodation. Ears, Nose, Mouth, and Throat no gross abnormality of ear auricles or external auditory canals. normal hearing noted during conversation. mucus membranes moist. Respiratory normal breathing without difficulty. Cardiovascular 1+ dorsalis pedis/posterior tibialis pulses. 2+ pitting edema of the bilateral lower extremities. Musculoskeletal normal gait and posture. no significant deformity or arthritic changes, no loss or range of motion, no clubbing. Psychiatric this patient is able to make decisions and demonstrates good insight into disease process. Alert and Oriented x 3. pleasant and cooperative. Notes Upon inspection patient's wounds currently again appear to be fairly superficial which is great news I do not see any signs of anything that I think will be a greater issue for her here.  Nonetheless I do believe that she does need some compression to squeeze the fluid out of her legs she has 2+ pitting edema of the bilateral lower extremities right greater than left. She states her legs are really not even that swollen compared  to what it used to be before she was on dialysis. Nonetheless I believe that she is going to need compression wraps to get these areas to heal and then likely ongoing compression following to keep them healed. Electronic Signature(s) Signed: 02/09/2020 6:52:45 PM By: [name] PA-C Entered By: [name] on 02/09/2020 18:52:44 -------------------------------------------------------------------------------- Physician Orders Details Patient Name: Date of Service: [facility] [name], [place] [name] 02/09/2020 1:15 PM Medical Record Number: [id number] Patient Account Number: [id number] Date of Birth/Sex: Treating RN: May 25, 1951 (69 y.o. [name] Primary Care Provider: [name] RD, [name] Other Clinician: Referring Provider: Treating Provider/Extender: [name] RD, [name] Weeks in Treatment: 0 Verbal / Phone Orders: No Diagnosis Coding ICD-10 Coding Code Description E11.621 Type 2 diabetes mellitus with foot ulcer L97.822 Non-pressure chronic ulcer of other part of left lower leg with fat layer exposed L97.312 Non-pressure chronic ulcer of right ankle with fat layer exposed L97.812 Non-pressure chronic ulcer of other part of right lower leg with fat layer exposed L97.512 Non-pressure chronic ulcer of other part of right foot with fat layer exposed N18.6 End stage renal disease Z99.2 Dependence on renal dialysis I10 Essential (primary) hypertension Follow-up Appointments Return Appointment in 1 week. Dressing Change Frequency Do not change entire dressing for one week. - both legs Skin Barriers/Peri-Wound Care Moisturizing lotion - both legs Wound Cleansing May shower with protection. Primary Wound Dressing lginate with Silver -  all wounds Calcium A Secondary Dressing Dry Gauze ABD pad - as needed Edema Control 3 Layer Compression System - Bilateral Avoid standing for long periods of time Elevate legs to the level of the heart or above for 30 minutes daily and/or when sitting, a frequency of: - throughout the day Exercise regularly Electronic Signature(s) Signed: 02/09/2020 6:54:26 PM By: [name] PA-C Signed: 02/10/2020 4:56:58 PM By: [name] RN, BSN Entered By: [name] on 02/09/2020 14:48:24 -------------------------------------------------------------------------------- Problem List Details Patient Name: Date of Service: [name], [place] [name] 02/09/2020 1:15 PM Medical Record Number: [id number] Patient Account Number: [id number] Date of Birth/Sex: Treating RN: 1950/09/23 (69 y.o. [name] Primary Care Provider: [name] RD, [name] Other Clinician: Referring Provider: Treating Provider/Extender: [name] RD, [name] Weeks in Treatment: 0 Active Problems ICD-10 Encounter Code Description Active Date MDM Diagnosis I87.2 Venous insufficiency (chronic) (peripheral) 02/09/2020 No Yes E11.621 Type 2 diabetes mellitus with foot ulcer 02/09/2020 No Yes L97.822 Non-pressure chronic ulcer of other part of left lower leg with fat layer exposed7/21/2021 No Yes L97.312 Non-pressure chronic ulcer of right ankle with fat layer exposed 02/09/2020 No Yes L97.812 Non-pressure chronic ulcer of other part of right lower leg with fat layer 02/09/2020 No Yes exposed L97.512 Non-pressure chronic ulcer of other part of right foot with fat layer exposed 02/09/2020 No Yes N18.6 End stage renal disease 02/09/2020 No Yes Z99.2 Dependence on renal dialysis 02/09/2020 No Yes I10 Essential (primary) hypertension 02/09/2020 No Yes Inactive Problems Resolved Problems Electronic Signature(s) Signed: 02/09/2020 2:51:06 PM By: [name] PA-C Previous Signature: 02/09/2020 2:36:55 PM Version By:  [name] PA-C Previous Signature: 02/09/2020 2:36:13 PM Version By: [name] PA-C Entered By: [name] on 02/09/2020 14:51:05 -------------------------------------------------------------------------------- Progress Note Details Patient Name: Date of Service: [name], [place] [name] 02/09/2020 1:15 PM Medical Record Number: [id number] Patient Account Number: [id number] Date of Birth/Sex: Treating RN: 1951/05/23 (69 y.o. [name] Primary Care Provider: [name] RD, [name] Other Clinician: Referring Provider:  Treating Provider/Extender: [name] RD, [name] Weeks in Treatment: 0 Subjective Chief Complaint Information obtained from Patient Multiple LE Ulcers History of Present Illness (HPI) Evaluate7/21/2021 today patient presents for evaluation of ulcerations on her legs which she tells me tend to come and go as far as small blistering locations and sometimes are better than other times. Right now she tells me that this is actually doing a little bit better but nonetheless will not completely go away. She has ulcerations bilaterally on her lower extremities. The right is worse than the left. 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 The patient has no evidence of active infection at this time. She however appears to have proficient blood flow into the extremities and I think would tolerate a 3 layer compression wrap she was noncompressible on arterial studies. Nonetheless there was no obvious signs of occlusion. Patient History Information obtained from Patient. Allergies penicillin, adhesive tape Family History Cancer - Mother, Diabetes - Mother,Siblings,Father, Heart Disease - Mother,Siblings, Hypertension - Mother,Siblings, Kidney Disease - Mother,Siblings, Stroke - Siblings, No family history of Hereditary Spherocytosis, Lung Disease,  Seizures, Thyroid Problems, Tuberculosis. Social History Never smoker, Marital Status - Single, Alcohol Use - Rarely, Drug Use - No History, Caffeine Use - Rarely. Medical History Eyes Patient has history of Cataracts Ear/Nose/Mouth/Throat Denies history of Chronic sinus problems/congestion, Middle ear problems Hematologic/Lymphatic Patient has history of Anemia Denies history of Hemophilia, Human Immunodeficiency Virus, Lymphedema, Sickle Cell Disease Respiratory Patient has history of Asthma Denies history of Aspiration, Chronic Obstructive Pulmonary Disease (COPD), Pneumothorax, Sleep Apnea, Tuberculosis Cardiovascular Patient has history of Hypertension Gastrointestinal Denies history of Cirrhosis , Colitis, Crohnoos, Hepatitis A, Hepatitis B, Hepatitis C Endocrine Patient has history of Type II Diabetes Genitourinary Patient has history of End Stage Renal Disease Immunological Denies history of Lupus Erythematosus, Raynaudoos, Scleroderma Integumentary (Skin) Denies history of History of Burn Musculoskeletal Patient has history of Osteomyelitis - feet Denies history of Gout, Rheumatoid Arthritis, Osteoarthritis Neurologic Patient has history of Neuropathy Denies history of Dementia, Quadriplegia, Paraplegia, Seizure Disorder Oncologic Denies history of Received Chemotherapy, Received Radiation Patient is treated with Insulin. Blood sugar is tested. 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 - bascilic vein transposition, 2019. - cataract extraction, 2019. - bilateral great toes, left 2nd toe. Medical A Surgical History Notes nd Eyes diabetic retinopathy Cardiovascular left ventricular dysfunction hyperlipidemia Genitourinary CKD, on dialysis Oncologic renal cell carcinoma Review of Systems (ROS) Constitutional Symptoms (General Health) Denies complaints or symptoms of Fatigue, Fever, Chills, Marked Weight Change. Eyes Complains or has  symptoms of Vision Changes. Ear/Nose/Mouth/Throat Denies complaints or symptoms of Chronic sinus problems or rhinitis. Respiratory Denies complaints or symptoms of Chronic or frequent coughs, Shortness of Breath. Cardiovascular Denies complaints or symptoms of Chest pain. Gastrointestinal Denies complaints or symptoms of Frequent diarrhea, Nausea, Vomiting. Genitourinary Denies complaints or symptoms of Frequent urination. Integumentary (Skin) Complains or has symptoms of Wounds, bilateral lower leg and right foot Musculoskeletal Denies complaints or symptoms of Muscle Pain, Muscle Weakness. Neurologic Denies complaints or symptoms of Numbness/parasthesias. Psychiatric Denies complaints or symptoms of Claustrophobia, Suicidal. Objective Constitutional sitting or standing blood pressure is within target range for patient.. pulse regular and within target range for patient.Marland Kitchen respirations regular, non-labored and within target range for patient.Marland Kitchen temperature within target range for patient.. Well-nourished and well-hydrated in no acute distress. Vitals Time Taken: 1:20 PM, Height: 67 in, Source:  Stated, Weight: 202 lbs, Source: Stated, BMI: 31.6, Temperature: 98.1 F, Pulse: 91 bpm, Respiratory Rate: 18 breaths/min, Blood Pressure: 94/61 mmHg, Capillary Blood Glucose: 135 mg/dl. General Notes: patient stated last CBG was 135 Eyes conjunctiva clear no eyelid edema noted. pupils equal round and reactive to light and accommodation. Ears, Nose, Mouth, and Throat no gross abnormality of ear auricles or external auditory canals. normal hearing noted during conversation. mucus membranes moist. Respiratory normal breathing without difficulty. Cardiovascular 1+ dorsalis pedis/posterior tibialis pulses. 2+ pitting edema of the bilateral lower extremities. Musculoskeletal normal gait and posture. no significant deformity or arthritic changes, no loss or range of motion, no  clubbing. Psychiatric this patient is able to make decisions and demonstrates good insight into disease process. Alert and Oriented x 3. pleasant and cooperative. General Notes: Upon inspection patient's wounds currently again appear to be fairly superficial which is great news I do not see any signs of anything that I think will be a greater issue for her here. Nonetheless I do believe that she does need some compression to squeeze the fluid out of her legs she has 2+ pitting edema of the bilateral lower extremities right greater than left. She states her legs are really not even that swollen compared to what it used to be before she was on dialysis. Nonetheless I believe that she is going to need compression wraps to get these areas to heal and then likely ongoing compression following to keep them healed. Integumentary (Hair, Skin) Wound #1 status is Open. Original cause of wound was Gradually Appeared. The wound is located on the Left,Proximal,Anterior Lower Leg. The wound measures 0.5cm length x 0.9cm width x 0.1cm depth; 0.353cm^2 area and 0.035cm^3 volume. There is Fat Layer (Subcutaneous Tissue) Exposed exposed. There is no tunneling or undermining noted. There is a small amount of serous drainage noted. The wound margin is distinct with the outline attached to the wound base. There is large (67-100%) pink granulation within the wound bed. There is no necrotic tissue within the wound bed. Wound #2 status is Open. Original cause of wound was Gradually Appeared. The wound is located on the [facility] Lower Leg. The wound measures 0.9cm length x 0.7cm width x 0.1cm depth; 0.495cm^2 area and 0.049cm^3 volume. There is Fat Layer (Subcutaneous Tissue) Exposed exposed. There is no tunneling or undermining noted. There is a small amount of serous drainage noted. The wound margin is distinct with the outline attached to the wound base. There is large (67-100%) pink granulation within the  wound bed. There is no necrotic tissue within the wound bed. Wound #3 status is Open. Original cause of wound was Gradually Appeared. The wound is located on the Right,Medial Malleolus. The wound measures 2.7cm length x 3cm width x 0.1cm depth; 6.362cm^2 area and 0.636cm^3 volume. There is Fat Layer (Subcutaneous Tissue) Exposed exposed. There is no tunneling or undermining noted. There is a medium amount of serous drainage noted. The wound margin is distinct with the outline attached to the wound base. There is small (1-33%) pink granulation within the wound bed. There is a large (67-100%) amount of necrotic tissue within the wound bed including Eschar and Adherent Slough. Wound #4 status is Open. Original cause of wound was Gradually Appeared. The wound is located on the Right,Medial Lower Leg. The wound measures 4.5cm length x 3cm width x 0.2cm depth; 10.603cm^2 area and 2.121cm^3 volume. There is Fat Layer (Subcutaneous Tissue) Exposed exposed. There is no tunneling or undermining noted. There is a medium  amount of serous drainage noted. The wound margin is distinct with the outline attached to the wound base. There is medium (34-66%) pink granulation within the wound bed. There is a medium (34-66%) amount of necrotic tissue within the wound bed including Adherent Slough. Wound #5 status is Open. Original cause of wound was Gradually Appeared. The wound is located on the Right,Anterior Lower Leg. The wound measures 7.5cm length x 3.5cm width x 0.1cm depth; 20.617cm^2 area and 2.062cm^3 volume. There is Fat Layer (Subcutaneous Tissue) Exposed exposed. There is no tunneling or undermining noted. There is a medium amount of serous drainage noted. The wound margin is distinct with the outline attached to the wound base. There is medium (34-66%) pink granulation within the wound bed. There is a medium (34-66%) amount of necrotic tissue within the wound bed including Eschar and Adherent Slough. Wound #6  status is Open. Original cause of wound was Gradually Appeared. The wound is located on the Right Amputation Site - T The wound measures oe. 0.3cm length x 0.3cm width x 0.1cm depth; 0.071cm^2 area and 0.007cm^3 volume. There is Fat Layer (Subcutaneous Tissue) Exposed exposed. There is no tunneling or undermining noted. There is a small amount of serous drainage noted. The wound margin is distinct with the outline attached to the wound base. There is large (67-100%) pink granulation within the wound bed. There is a small (1-33%) amount of necrotic tissue within the wound bed including Adherent Slough. 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 There was a Three Layer Compression Therapy Procedure by [name], RN. Post procedure Diagnosis Wound #1: Same as Pre-Procedure Wound #4 Pre-procedure diagnosis of Wound #4 is a Diabetic Wound/Ulcer of the Lower Extremity located on the Right,Medial Lower Leg . There was a Three Layer Compression Therapy Procedure by [name], RN. Post procedure Diagnosis Wound #4: Same as Pre-Procedure Plan Follow-up Appointments: Return Appointment in 1 week. Dressing Change Frequency: Do not change entire dressing for one week. - both legs Skin Barriers/Peri-Wound Care: Moisturizing lotion - both legs Wound Cleansing: May shower with  protection. Primary Wound Dressing: Calcium Alginate with Silver - all wounds Secondary Dressing: Dry Gauze ABD pad - as needed Edema Control: 3 Layer Compression System - Bilateral Avoid standing for long periods of time Elevate legs to the level of the heart or above for 30 minutes daily and/or when sitting, a frequency of: - throughout the day Exercise regularly 1. We will initiate treatment with a silver alginate dressing along with a 3 layer compression wrap bilaterally. 2. I am also can recommend the patient needs to elevate her legs much as possible try to keep edema under good control. 3. I would also recommend that she continue to monitor for any signs of infection though I do not see anything right now that seems to be a problem that can obviously change rather rapidly. We will be keeping an eye on her as well as we see her back  for follow-up. 4. I am going to also suggest that she will likely need some kind of Velcro compression wraps that she is not able to use standard compression socks. We will see about measuring her for those next week after we get some of the fluid down. We will see patient back for reevaluation in 1 week here in the clinic. If anything worsens or changes patient will contact our office for additional recommendations. Electronic Signature(s) Signed: 02/09/2020 6:53:30 PM By: [name] PA-C Entered By: [name] on 02/09/2020 18:53:30 -------------------------------------------------------------------------------- HxROS Details Patient Name: Date of Service: [name], [place] [name] 02/09/2020 1:15 PM Medical Record Number: [id number] Patient Account Number: [id number] Date of Birth/Sex: Treating RN: 1951/06/23 (69 y.o. Clearnce Sorrel Primary Care Provider: Diamond, [name] Other Clinician: Referring Provider: Treating Provider/Extender: [name] RD, [name] Weeks in Treatment: 0 Information Obtained From Patient Constitutional  Symptoms (General Health) Complaints and Symptoms: Negative for: Fatigue; Fever; Chills; Marked Weight Change Eyes Complaints and Symptoms: Positive for: Vision Changes Medical History: Positive for: Cataracts Past Medical History Notes: diabetic retinopathy Ear/Nose/Mouth/Throat Complaints and Symptoms: Negative for: Chronic sinus problems or rhinitis Medical History: Negative for: Chronic sinus problems/congestion; Middle ear problems Respiratory Complaints and Symptoms: Negative for: Chronic or frequent coughs; Shortness of Breath Medical History: Positive for: Asthma Negative for: Aspiration; Chronic Obstructive Pulmonary Disease (COPD); Pneumothorax; Sleep Apnea; Tuberculosis Cardiovascular Complaints and Symptoms: Negative for: Chest pain Medical History: Positive for: Hypertension Past Medical History Notes: left ventricular dysfunction hyperlipidemia Gastrointestinal Complaints and Symptoms: Negative for: Frequent diarrhea; Nausea; Vomiting Medical History: Negative for: Cirrhosis ; Colitis; Crohns; Hepatitis A; Hepatitis B; Hepatitis C Genitourinary Complaints and Symptoms: Negative for: Frequent urination Medical History: Positive for: End Stage Renal Disease Past Medical History Notes: CKD, on dialysis Integumentary (Skin) Complaints and Symptoms: Positive for: Wounds Review of System Notes: bilateral lower leg and right foot Medical History: Negative for: History of Burn Musculoskeletal Complaints and Symptoms: Negative for: Muscle Pain; Muscle Weakness Medical History: Positive for: Osteomyelitis - feet Negative for: Gout; Rheumatoid Arthritis; Osteoarthritis Neurologic Complaints and Symptoms: Negative for: Numbness/parasthesias Medical History: Positive for: Neuropathy Negative for: Dementia; Quadriplegia; Paraplegia; Seizure Disorder Psychiatric Complaints and Symptoms: Negative for: Claustrophobia; Suicidal Hematologic/Lymphatic Medical  History: Positive for: Anemia Negative for: Hemophilia; Human Immunodeficiency Virus; Lymphedema; Sickle Cell Disease Endocrine Medical History: Positive for: Type II Diabetes Time with diabetes: 20 years Treated with: Insulin Blood sugar tested every day: Yes Tested : Immunological Medical History: Negative for: Lupus Erythematosus; Raynauds; Scleroderma Oncologic Medical History: Negative for: Received Chemotherapy; Received Radiation Past Medical History Notes: renal cell carcinoma HBO Extended History Items Eyes: Cataracts Immunizations Pneumococcal Vaccine: Received Pneumococcal Vaccination: No Implantable Devices None Hospitalization / Surgery History Type of Hospitalization/Surgery right 2nd toe amputation, 9833 bascilic vein transposition, 2019 cataract extraction, 2019 bilateral great toes, left 2nd toe Family and Social History Cancer: Yes - Mother; Diabetes: Yes - Mother,Siblings,Father; Heart Disease: Yes - Mother,Siblings; Hereditary Spherocytosis: No; Hypertension: Yes - Mother,Siblings; Kidney Disease: Yes - Mother,Siblings; Lung Disease: No; Seizures: No; Stroke: Yes - Siblings; Thyroid Problems: No; Tuberculosis: No; Never smoker; Marital Status - Single; Alcohol Use: Rarely; Drug Use: No History; Caffeine Use: Rarely; Financial Concerns: No; Food, Clothing or Shelter Needs: No; Support System Lacking: No; Transportation Concerns: No Electronic Signature(s) Signed: 02/09/2020 5:52:13 PM By: Kela Millin Signed: 02/09/2020 6:54:26 PM By: [name] PA-C Entered By: Kela Millin on 02/09/2020 14:10:29 -------------------------------------------------------------------------------- SuperBill Details Patient Name: Date of  Service: [employer] [name], [place] [name] 02/09/2020 Medical Record Number: [id number] Patient Account Number: [id number] Date of Birth/Sex: Treating RN: 08-05-1950 (69 y.o. 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 Modifier: 1 Quantity: Physician Procedures : CPT4 Code Description Modifier [id number] WC PHYS LEVEL 3 NEW PT ICD-10 Diagnosis Description E11.621 Type 2 diabetes mellitus with foot ulcer L97.822 Non-pressure chronic ulcer of other part of left lower leg with fat layer exposed L97.812 Non-pressure  chronic ulcer of other part of right lower leg with fat layer exposed L97.312 Non-pressure chronic ulcer of right ankle with fat layer exposed Quantity: 1 Electronic Signature(s) Signed: 02/09/2020 6:53:43 PM By: [name] PA-C Entered By: [name] on 02/09/2020 18:53:43

## 2020-02-11 ENCOUNTER — Other Ambulatory Visit: Payer: Self-pay | Admitting: *Deleted

## 2020-02-11 MED ORDER — TRESIBA FLEXTOUCH 100 UNIT/ML ~~LOC~~ SOPN: 30.0000 [IU] | PEN_INJECTOR | Freq: Every day | SUBCUTANEOUS | 0 refills | Status: DC

## 2020-02-11 NOTE — Progress Notes (Signed)
KAELAN, EMAMI ([id number]) Visit Report for 02/09/2020 Allergy List Details Patient Name: Date of Service: [name], [name] 02/09/2020 1:15 PM Medical Record Number: [id number] Patient Account Number: [id number] Date of Birth/Sex: Treating RN: 1951/07/01 (69 y.o. Clearnce Sorrel Primary Care Kyla Duffy: [name] RD, [name] Other Clinician: Referring Shea Swalley: Treating Siomara Burkel/Extender: [name] RD, [name] Weeks in Treatment: 0 Allergies Active Allergies penicillin adhesive tape Allergy Notes Electronic Signature(s) Signed: 02/09/2020 5:52:13 PM By: Kela Millin Entered By: Kela Millin on 02/09/2020 13:26:32 -------------------------------------------------------------------------------- Arrival Information Details Patient Name: Date of Service: [name], [place] [name] 02/09/2020 1:15 PM Medical Record Number: [id number] Patient Account Number: [id number] Date of Birth/Sex: Treating RN: [birth year] (69 y.o. Clearnce Sorrel Primary Care Sidi Dzikowski: [name] RD, [name] Other Clinician: Referring Coutney Wildermuth: Treating Vinicius Brockman/Extender: [name] RD, [name] Weeks in Treatment: 0 Visit Information Patient Arrived: Wheel Chair Arrival Time: 13:18 Accompanied By: self Transfer Assistance: None Patient Identification Verified: Yes Secondary Verification Process Completed: Yes Patient Has Alerts: Yes Patient Alerts: Right ABI: [place] Left ABI: [place] Electronic Signature(s) Signed: 02/09/2020 5:52:13 PM By: Kela Millin Entered By: Kela Millin on 02/09/2020 13:24:48 -------------------------------------------------------------------------------- Clinic Level of Care Assessment Details Patient Name: Date of Service: [facility] [name], [name] 02/09/2020 1:15 PM Medical Record Number: [id number] Patient Account Number: [id number] Date of Birth/Sex: Treating RN: 12/29/1950 (69 y.o. [name] Primary Care Copper Basnett: [name] RD, [name] Other  Clinician: Referring Hugo Lybrand: Treating Valencia Kassa/Extender: [name] RD, [name] Weeks in Treatment: 0 Clinic Level of Care Assessment Items TOOL 1 Quantity Score []  - 0 Use when EandM and Procedure is performed on INITIAL visit ASSESSMENTS - Nursing Assessment / Reassessment X- 1 20 General Physical Exam (combine w/ comprehensive assessment (listed just below) when performed on new pt. evals) X- 1 25 Comprehensive Assessment (HX, ROS, Risk Assessments, Wounds Hx, etc.) ASSESSMENTS - Wound and Skin Assessment / Reassessment []  - 0 Dermatologic / Skin Assessment (not related to wound area) ASSESSMENTS - Ostomy and/or Continence Assessment and Care []  - 0 Incontinence Assessment and Management []  - 0 Ostomy Care Assessment and Management (repouching, etc.) PROCESS - Coordination of Care X - Simple Patient / Family Education for ongoing care 1 15 []  - 0 Complex (extensive) Patient / Family Education for ongoing care X- 1 10 Staff obtains Programmer, systems, Records, T Results / Process Orders est []  - 0 Staff telephones HHA, Nursing Homes / Clarify orders / etc []  - 0 Routine Transfer to another Facility (non-emergent condition) []  - 0 Routine Hospital Admission (non-emergent condition) X- 1 15 New Admissions / Biomedical engineer / Ordering NPWT Apligraf, etc. , []  - 0 Emergency Hospital Admission (emergent condition) PROCESS - Special Needs []  - 0 Pediatric / Minor Patient Management []  - 0 Isolation Patient Management []  - 0 Hearing / Language / Visual special needs []  - 0 Assessment of Community assistance (transportation, D/C planning, etc.) []  - 0 Additional assistance / Altered mentation []  - 0 Support Surface(s) Assessment (bed, cushion, seat, etc.) INTERVENTIONS - Miscellaneous []  - 0 External ear exam []  - 0 Patient Transfer (multiple staff / Civil Service fast streamer / Similar devices) []  - 0 Simple Staple / Suture removal (25 or less) []  - 0 Complex  Staple / Suture removal (26 or more) []  - 0 Hypo/Hyperglycemic Management (do not check if billed separately) []  - 0 Ankle / Brachial Index (ABI) - do not check if billed separately Has the patient been seen  at the hospital within the last three years: Yes Total Score: 85 Level Of Care: New/Established - Level 3 Electronic Signature(s) Signed: 02/10/2020 4:56:58 PM By: [name] RN, BSN Entered By: [name] on 02/09/2020 14:44:37 -------------------------------------------------------------------------------- Compression Therapy Details Patient Name: Date of Service: [name], [place] [name] 02/09/2020 1:15 PM Medical Record Number: [id number] Patient Account Number: [id number] Date of Birth/Sex: Treating RN: 25-Nov-1950 (69 y.o. [name] Primary Care Ana Liaw: [name] RD, [name] Other Clinician: Referring Ryah Cribb: Treating D'Arcy Abraha/Extender: [name] RD, [name] Weeks in Treatment: 0 Compression Therapy Performed for Wound Assessment: Wound #1 Left,Proximal,Anterior Lower Leg Performed By: Clinician [name], RN Compression Type: Three Layer Post Procedure Diagnosis Same as Pre-procedure Electronic Signature(s) Signed: 02/10/2020 4:56:58 PM By: [name] RN, BSN Entered By: [name] on 02/09/2020 14:45:18 -------------------------------------------------------------------------------- Compression Therapy Details Patient Name: Date of Service: [name], [place] [name] 02/09/2020 1:15 PM Medical Record Number: [id number] Patient Account Number: [id number] Date of Birth/Sex: Treating RN: 1951/01/14 (69 y.o. [name] Primary Care Basir Niven: [name] RD, [name] Other Clinician: Referring Carleton Van[place]ken[place]: Treating Samayra Hebel/Extender: [name] RD, [name] Weeks in Treatment: 0 Compression Therapy Performed for Wound Assessment: Wound #4 Right,Medial Lower Leg Performed By: Clinician [name], RN Compression Type: Three  Layer Post Procedure Diagnosis Same as Pre-procedure Electronic Signature(s) Signed: 02/10/2020 4:56:58 PM By: [name] RN, BSN Entered By: [name] on 02/09/2020 14:45:18 -------------------------------------------------------------------------------- Encounter Discharge Information Details Patient Name: Date of Service: [facility] [name], [place] [name] 02/09/2020 1:15 PM Medical Record Number: [id number] Patient Account Number: [id number] Date of Birth/Sex: Treating RN: Dec 11, 1950 (69 y.o. [name] Primary Care Makaiah Terwilliger: [name] RD, [name] Other Clinician: Referring Niah Heinle: Treating Lithzy Bernard/Extender: [name] RD, [name] Weeks in Treatment: 0 Encounter Discharge Information Items Discharge Condition: Stable Ambulatory Status: Wheelchair Discharge Destination: Home Transportation: Private Auto Accompanied By: self Schedule Follow-up Appointment: Yes Clinical Summary of Care: Patient Declined Electronic Signature(s) Signed: 02/11/2020 5:42:58 PM By: [name] RN Entered By: [name] on 02/09/2020 15:10:27 -------------------------------------------------------------------------------- Lower Extremity Assessment Details Patient Name: Date of Service: [facility] [name], [name] 02/09/2020 1:15 PM Medical Record Number: [id number] Patient Account Number: [id number] Date of Birth/Sex: Treating RN: Sep 06, 1950 (69 y.o. Clearnce Sorrel Primary Care Mose Colaizzi: [name] RD, [name] Other Clinician: Referring Mckena Chern: Treating Lizzeth Meder/Extender: [name] RD, [name] Weeks in Treatment: 0 Edema Assessment Assessed: [Left: No] [Right: No] E[Left: dema] [Right: :] Calf Left: Right: Point of Measurement: 38 cm From Medial Instep 39 cm 37 cm Ankle Left: Right: Point of Measurement: 13 cm From Medial Instep 24.5 cm 27 cm Vascular Assessment Pulses: Dorsalis Pedis Palpable: [Left:Yes] [Right:Yes] Electronic Signature(s) Signed: 02/09/2020 5:52:13  PM By: Kela Millin Entered By: Kela Millin on 02/09/2020 13:44:00 -------------------------------------------------------------------------------- Multi-Disciplinary Care Plan Details Patient Name: Date of Service: [facility] [name], [place] [name] 02/09/2020 1:15 PM Medical Record Number: [id number] Patient Account Number: [id number] Date of Birth/Sex: Treating RN: April 08, 1951 (69 y.o. [name] Primary Care Kashvi Prevette: [name] RD, [name] Other Clinician: Referring Chayce Robbins: Treating Alzena Gerber/Extender: [name] RD, [name] Weeks in Treatment: 0 Active Inactive Abuse / Safety / Falls / Self Care Management Nursing Diagnoses: Potential for falls Goals: Patient/caregiver will verbalize/demonstrate measures taken to prevent injury and/or falls Date Initiated: 02/09/2020 Target Resolution Date: 03/08/2020 Goal Status: Active Interventions: Assess fall risk on admission and as needed Notes: Venous Leg Ulcer Nursing Diagnoses: Knowledge deficit related to disease  process and management Potential for venous Insuffiency (use before diagnosis confirmed) Goals: Patient will maintain optimal edema control Date Initiated: 02/09/2020 Target Resolution Date: 03/08/2020 Goal Status: Active Patient/caregiver will verbalize understanding of disease process and disease management Date Initiated: 02/09/2020 Target Resolution Date: 03/08/2020 Goal Status: Active Interventions: Assess peripheral edema status every visit. Compression as ordered Provide education on venous insufficiency Treatment Activities: Therapeutic compression applied : 02/09/2020 Notes: Wound/Skin Impairment Nursing Diagnoses: Impaired tissue integrity Knowledge deficit related to ulceration/compromised skin integrity Goals: Patient/caregiver will verbalize understanding of skin care regimen Date Initiated: 02/09/2020 Target Resolution Date: 03/08/2020 Goal Status: Active Ulcer/skin breakdown will have a  volume reduction of 30% by week 4 Date Initiated: 02/09/2020 Target Resolution Date: 03/08/2020 Goal Status: Active Interventions: Assess patient/caregiver ability to obtain necessary supplies Assess patient/caregiver ability to perform ulcer/skin care regimen upon admission and as needed Assess ulceration(s) every visit Provide education on ulcer and skin care Treatment Activities: Skin care regimen initiated : 02/09/2020 Topical wound management initiated : 02/09/2020 Notes: Electronic Signature(s) Signed: 02/10/2020 4:56:58 PM By: [name] RN, BSN Entered By: [name] on 02/09/2020 14:42:59 -------------------------------------------------------------------------------- Pain Assessment Details Patient Name: Date of Service: [name], [place] [name] 02/09/2020 1:15 PM Medical Record Number: [id number] Patient Account Number: [id number] Date of Birth/Sex: Treating RN: 1951/02/04 (69 y.o. Clearnce Sorrel Primary Care Geovanna Simko: [name] RD, [name] Other Clinician: Referring Kambria Grima: Treating Aerica Rincon/Extender: [name] RD, [name] Weeks in Treatment: 0 Active Problems Location of Pain Severity and Description of Pain Patient Has Paino No Site Locations Pain Management and Medication Current Pain Management: Electronic Signature(s) Signed: 02/09/2020 5:52:13 PM By: Kela Millin Entered By: Kela Millin on 02/09/2020 14:06:35 -------------------------------------------------------------------------------- Patient/Caregiver Education Details Patient Name: Date of Service: [facility] [name], [place] [name] 7/21/2021andnbsp1:15 PM Medical Record Number: [id number] Patient Account Number: [id number] Date of Birth/Gender: Treating RN: 1950-08-28 (69 y.o. [name] Primary Care Physician: [name] RD, [name] Other Clinician: Referring Physician: Treating Physician/Extender: [name] RD, [name] Weeks in Treatment: 0 Education  Assessment Education Provided To: Patient Education Topics Provided Venous: Handouts: Controlling Swelling with Multilayered Compression Wraps, Managing Venous Disease and Related Ulcers Methods: Explain/Verbal, Printed Responses: Reinforcements needed, State content correctly [place]: o Handouts: Welcome T [facility] o Methods: Explain/Verbal, Printed Responses: Reinforcements needed, State content correctly Wound/Skin Impairment: Electronic Signature(s) Signed: 02/10/2020 4:56:58 PM By: [name] RN, BSN Entered By: [name] on 02/09/2020 14:43:47 -------------------------------------------------------------------------------- Wound Assessment Details Patient Name: Date of Service: [name], [place] [name] 02/09/2020 1:15 PM Medical Record Number: [id number] Patient Account Number: [id number] Date of Birth/Sex: Treating RN: 1951-02-01 (69 y.o. Clearnce Sorrel Primary Care Takisha Pelle: [name] RD, [name] Other Clinician: Referring Donnalynn Wheeless: Treating Tonyia Marschall/Extender: [name] RD, [name] Weeks in Treatment: 0 Wound Status Wound Number: 1 Primary Diabetic Wound/Ulcer of the Lower Extremity Etiology: Wound Location: Left, Proximal, Anterior Lower Leg Wound Open Wounding Event: Gradually Appeared Status: Date Acquired: 01/20/2020 Comorbid Cataracts, Anemia, Asthma, Hypertension, Type II Diabetes, Weeks Of Treatment: 0 History: End Stage Renal Disease, Neuropathy Clustered Wound: No Photos Photo Uploaded By: Mikeal Hawthorne on 02/10/2020 16:14:43 Wound Measurements Length: (cm) 0.5 Width: (cm) 0.9 Depth: (cm) 0.1 Area: (cm) 0.353 Volume: (cm) 0.035 % Reduction in Area: % Reduction in Volume: Epithelialization: None Tunneling: No Undermining: No Wound Description Classification: Grade 2 Wound Margin: Distinct, outline attached Exudate Amount: Small Exudate Type: Serous Exudate Color: amber Foul Odor After  Cleansing: No Slough/Fibrino No Wound Bed Granulation Amount: Large (67-100%) Exposed Structure Granulation Quality: Pink Fascia Exposed: No Necrotic Amount: None Present (0%) Fat Layer (Subcutaneous Tissue) Exposed: Yes Tendon Exposed: No Muscle Exposed: No Joint Exposed: No Bone Exposed: No Treatment Notes Wound #1 (Left, Proximal, Anterior Lower Leg) 1. Cleanse With Wound Cleanser 3. Primary Dressing Applied Calcium Alginate Ag 4. Secondary Dressing Dry Gauze 6. Support Layer Applied 3 layer compression wrap Notes netting Electronic Signature(s) Signed: 02/09/2020 5:52:13 PM By: Kela Millin Entered By: Kela Millin on 02/09/2020 13:57:38 -------------------------------------------------------------------------------- Wound Assessment Details Patient Name: Date of Service: [facility] [name], [place] [name] 02/09/2020 1:15 PM Medical Record Number: [id number] Patient Account Number: [id number] Date of Birth/Sex: Treating RN: [birth year] (69 y.o. Clearnce Sorrel Primary Care Lusine Corlett: [name] RD, [name] Other Clinician: Referring Keimon Basaldua: Treating Loys Hoselton/Extender: [name] RD, [name] Weeks in Treatment: 0 Wound Status Wound Number: 2 Primary Diabetic Wound/Ulcer of the Lower Extremity Etiology: Wound Location: Left, Distal, Anterior Lower Leg Wound Open Wounding Event: Gradually Appeared Status: Date Acquired: 01/20/2020 Comorbid Cataracts, Anemia, Asthma, Hypertension, Type II Diabetes, Weeks Of Treatment: 0 History: End Stage Renal Disease, Neuropathy Clustered Wound: No Photos Photo Uploaded By: Mikeal Hawthorne on 02/10/2020 16:14:57 Wound Measurements Length: (cm) 0.9 Width: (cm) 0.7 Depth: (cm) 0.1 Area: (cm) 0.495 Volume: (cm) 0.049 % Reduction in Area: % Reduction in Volume: Epithelialization: None Tunneling: No Undermining: No Wound Description Classification: Grade 2 Wound Margin: Distinct, outline attached Exudate Amount:  Small Exudate Type: Serous Exudate Color: amber Foul Odor After Cleansing: No Slough/Fibrino No Wound Bed Granulation Amount: Large (67-100%) Exposed Structure Granulation Quality: Pink Fascia Exposed: No Necrotic Amount: None Present (0%) Fat Layer (Subcutaneous Tissue) Exposed: Yes Tendon Exposed: No Muscle Exposed: No Joint Exposed: No Bone Exposed: No Treatment Notes Wound #2 (Left, Distal, Anterior Lower Leg) 1. Cleanse With Wound Cleanser 3. Primary Dressing Applied Calcium Alginate Ag 4. Secondary Dressing Dry Gauze 6. Support Layer Applied 3 layer compression wrap Notes netting Electronic Signature(s) Signed: 02/09/2020 5:52:13 PM By: Kela Millin Entered By: Kela Millin on 02/09/2020 14:00:27 -------------------------------------------------------------------------------- Wound Assessment Details Patient Name: Date of Service: [facility] [name], [place] [name] 02/09/2020 1:15 PM Medical Record Number: [id number] Patient Account Number: [id number] Date of Birth/Sex: Treating RN: [birth year] (69 y.o. Clearnce Sorrel Primary Care Garold Sheeler: [name] RD, [name] Other Clinician: Referring Fotios Amos: Treating Judea Fennimore/Extender: [name] RD, [name] Weeks in Treatment: 0 Wound Status Wound Number: 3 Primary Diabetic Wound/Ulcer of the Lower Extremity Etiology: Wound Location: Right, Medial Malleolus Wound Open Wounding Event: Gradually Appeared Status: Date Acquired: 10/21/2019 Comorbid Cataracts, Anemia, Asthma, Hypertension, Type II Diabetes, Weeks Of Treatment: 0 History: End Stage Renal Disease, Neuropathy Clustered Wound: Yes Photos Photo Uploaded By: Mikeal Hawthorne on 02/11/2020 10:39:15 Wound Measurements Length: (cm) 2.7 Width: (cm) 3 Depth: (cm) 0.1 Clustered Quantity: 3 Area: (cm) 6.362 Volume: (cm) 0.636 % Reduction in Area: % Reduction in Volume: Epithelialization: None Tunneling: No Undermining: No Wound  Description Classification: Grade 2 Wound Margin: Distinct, outline attached Exudate Amount: Medium Exudate Type: Serous Exudate Color: amber Foul Odor After Cleansing: No Slough/Fibrino Yes Wound Bed Granulation Amount: Small (1-33%) Exposed Structure Granulation Quality: Pink Fascia Exposed: No Necrotic Amount: Large (67-100%) Fat Layer (Subcutaneous Tissue) Exposed: Yes Necrotic Quality: Eschar, Adherent Slough Tendon Exposed: No Muscle Exposed: No Joint Exposed: No Bone Exposed: No Treatment Notes Wound #3 (Right, Medial Malleolus) 1. Cleanse With Wound Cleanser 3. Primary Dressing Applied Calcium Alginate Ag 4.  Secondary Dressing Dry Gauze 6. Support Layer Applied 3 layer compression wrap Notes netting Electronic Signature(s) Signed: 02/09/2020 5:52:13 PM By: Kela Millin Entered By: Kela Millin on 02/09/2020 14:02:04 -------------------------------------------------------------------------------- Wound Assessment Details Patient Name: Date of Service: [facility] [name], [place] [name] 02/09/2020 1:15 PM Medical Record Number: [id number] Patient Account Number: [id number] Date of Birth/Sex: Treating RN: 1951-03-31 (69 y.o. Clearnce Sorrel Primary Care Ishika [place]field: [name] RD, [name] Other Clinician: Referring Alanda Colton: Treating Tekeshia Klahr/Extender: [name] RD, [name] Weeks in Treatment: 0 Wound Status Wound Number: 4 Primary Diabetic Wound/Ulcer of the Lower Extremity Etiology: Wound Location: Right, Medial Lower Leg Wound Open Wounding Event: Gradually Appeared Status: Date Acquired: 10/21/2019 Comorbid Cataracts, Anemia, Asthma, Hypertension, Type II Diabetes, Weeks Of Treatment: 0 History: End Stage Renal Disease, Neuropathy Clustered Wound: Yes Photos Photo Uploaded By: Mikeal Hawthorne on 02/11/2020 10:39:29 Wound Measurements Length: (cm) 4.5 Width: (cm) 3 Depth: (cm) 0.2 Clustered Quantity: 3 Area: (cm) 10.603 Volume: (cm) 2.121 %  Reduction in Area: 0% % Reduction in Volume: 0% Epithelialization: None Tunneling: No Undermining: No Wound Description Classification: Grade 2 Wound Margin: Distinct, outline attached Exudate Amount: Medium Exudate Type: Serous Exudate Color: amber Foul Odor After Cleansing: No Slough/Fibrino Yes Wound Bed Granulation Amount: Medium (34-66%) Exposed Structure Granulation Quality: Pink Fascia Exposed: No Necrotic Amount: Medium (34-66%) Fat Layer (Subcutaneous Tissue) Exposed: Yes Necrotic Quality: Adherent Slough Tendon Exposed: No Muscle Exposed: No Joint Exposed: No Bone Exposed: No Treatment Notes Wound #4 (Right, Medial Lower Leg) 1. Cleanse With Wound Cleanser 3. Primary Dressing Applied Calcium Alginate Ag 4. Secondary Dressing Dry Gauze 6. Support Layer Applied 3 layer compression wrap Notes netting Electronic Signature(s) Signed: 02/09/2020 5:52:13 PM By: Kela Millin Entered By: Kela Millin on 02/09/2020 14:04:41 -------------------------------------------------------------------------------- Wound Assessment Details Patient Name: Date of Service: [facility] [name], [place] [name] 02/09/2020 1:15 PM Medical Record Number: [id number] Patient Account Number: [id number] Date of Birth/Sex: Treating RN: October 01, 1950 (69 y.o. Clearnce Sorrel Primary Care Vylette Strubel: [name] RD, [name] Other Clinician: Referring Zion Ta: Treating Gennie Eisinger/Extender: [name] RD, [name] Weeks in Treatment: 0 Wound Status Wound Number: 5 Primary Diabetic Wound/Ulcer of the Lower Extremity Etiology: Wound Location: Right, Anterior Lower Leg Wound Open Wounding Event: Gradually Appeared Status: Date Acquired: 10/21/2019 Comorbid Cataracts, Anemia, Asthma, Hypertension, Type II Diabetes, Weeks Of Treatment: 0 History: End Stage Renal Disease, Neuropathy Clustered Wound: Yes Photos Photo Uploaded By: Mikeal Hawthorne on 02/11/2020 10:39:45 Wound Measurements Length:  (cm) 7.5 Width: (cm) 3.5 Depth: (cm) 0.1 Clustered Quantity: 4 Area: (cm) 20.6 Volume: (cm) 2.06 % Reduction in Area: 0% % Reduction in Volume: 0% Epithelialization: None Tunneling: No 17 Undermining: No 2 Wound Description Classification: Grade 2 Wound Margin: Distinct, outline attached Exudate Amount: Medium Exudate Type: Serous Exudate Color: amber Foul Odor After Cleansing: No Slough/Fibrino Yes Wound Bed Granulation Amount: Medium (34-66%) Exposed Structure Granulation Quality: Pink Fascia Exposed: No Necrotic Amount: Medium (34-66%) Fat Layer (Subcutaneous Tissue) Exposed: Yes Necrotic Quality: Eschar, Adherent Slough Tendon Exposed: No Muscle Exposed: No Joint Exposed: No Bone Exposed: No Treatment Notes Wound #5 (Right, Anterior Lower Leg) 1. Cleanse With Wound Cleanser 3. Primary Dressing Applied Calcium Alginate Ag 4. Secondary Dressing Dry Gauze 6. Support Layer Applied 3 layer compression wrap Notes netting Electronic Signature(s) Signed: 02/09/2020 5:52:13 PM By: Kela Millin Entered By: Kela Millin on 02/09/2020 14:04:21 -------------------------------------------------------------------------------- Wound Assessment Details Patient Name: Date of Service: [name], [place] [name] 02/09/2020 1:15 PM Medical Record Number: [id number]  Patient Account Number: [id number] Date of Birth/Sex: Treating RN: February 21, 1951 (69 y.o. 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 Wound Open Wounding Event: Gradually Appeared Status: Date Acquired: 10/21/2019 Comorbid Cataracts, Anemia, Asthma, Hypertension, Type II Diabetes, Weeks Of Treatment: 0 History: End Stage Renal Disease,  Neuropathy Clustered Wound: No Photos Photo Uploaded By: Mikeal Hawthorne on 02/11/2020 10:40:00 Wound Measurements Length: (cm) 0.3 Width: (cm) 0.3 Depth: (cm) 0.1 Area: (cm) 0.071 Volume: (cm) 0.007 % Reduction in Area: % Reduction in Volume: Epithelialization: None Tunneling: No Undermining: No Wound Description Classification: Grade 2 Wound Margin: Distinct, outline attached Exudate Amount: Small Exudate Type: Serous Exudate Color: amber Foul Odor After Cleansing: No Slough/Fibrino Yes Wound Bed Granulation Amount: Large (67-100%) Exposed Structure Granulation Quality: Pink Fascia Exposed: No Necrotic Amount: Small (1-33%) Fat Layer (Subcutaneous Tissue) Exposed: Yes Necrotic Quality: Adherent Slough Tendon Exposed: No Muscle Exposed: No Joint Exposed: No Bone Exposed: No Treatment Notes Wound #6 (Right Amputation Site - Toe) 1. Cleanse With Wound Cleanser 3. Primary Dressing Applied Calcium Alginate Ag 4. Secondary Dressing Dry Gauze 6. Support Layer Applied 3 layer compression wrap Notes netting Electronic Signature(s) Signed: 02/09/2020 5:52:13 PM By: Kela Millin Entered By: Kela Millin on 02/09/2020 14:05:59 -------------------------------------------------------------------------------- Vitals Details Patient Name: Date of Service: [name], [place] [name] 02/09/2020 1:15 PM Medical Record Number: [id number] Patient Account Number: [id number] Date of Birth/Sex: Treating RN: 10-11-1950 (69 y.o. Clearnce Sorrel Primary Care Costantino Kohlbeck: [place], [place] Other Clinician: Referring Kiandra Sanguinetti: Treating Arlisa Leclere/Extender: [name] RD, [name] Weeks in Treatment: 0 Vital Signs Time Taken: 13:20 Temperature (F): 98.1 Height (in): 67 Pulse (bpm): 91 Source: Stated Respiratory Rate (breaths/min): 18 Weight (lbs): 202 Blood Pressure (mmHg): 94/61 Source: Stated Capillary Blood Glucose (mg/dl): 135 Body Mass Index (BMI):  31.6 Reference Range: 80 - 120 mg / dl Notes patient stated last CBG was 135 Electronic Signature(s) Signed: 02/09/2020 5:52:13 PM By: Kela Millin Entered By: Kela Millin on 02/09/2020 13:25:43

## 2020-02-12 DIAGNOSIS — D631 Anemia in chronic kidney disease: Secondary | ICD-10-CM | POA: Diagnosis not present

## 2020-02-12 DIAGNOSIS — D509 Iron deficiency anemia, unspecified: Secondary | ICD-10-CM | POA: Diagnosis not present

## 2020-02-12 DIAGNOSIS — N2581 Secondary hyperparathyroidism of renal origin: Secondary | ICD-10-CM | POA: Diagnosis not present

## 2020-02-12 DIAGNOSIS — E1129 Type 2 diabetes mellitus with other diabetic kidney complication: Secondary | ICD-10-CM | POA: Diagnosis not present

## 2020-02-12 DIAGNOSIS — N186 End stage renal disease: Secondary | ICD-10-CM | POA: Diagnosis not present

## 2020-02-12 DIAGNOSIS — Z992 Dependence on renal dialysis: Secondary | ICD-10-CM | POA: Diagnosis not present

## 2020-02-12 DIAGNOSIS — D689 Coagulation defect, unspecified: Secondary | ICD-10-CM | POA: Diagnosis not present

## 2020-02-15 DIAGNOSIS — E1129 Type 2 diabetes mellitus with other diabetic kidney complication: Secondary | ICD-10-CM | POA: Diagnosis not present

## 2020-02-15 DIAGNOSIS — D509 Iron deficiency anemia, unspecified: Secondary | ICD-10-CM | POA: Diagnosis not present

## 2020-02-15 DIAGNOSIS — D631 Anemia in chronic kidney disease: Secondary | ICD-10-CM | POA: Diagnosis not present

## 2020-02-15 DIAGNOSIS — D689 Coagulation defect, unspecified: Secondary | ICD-10-CM | POA: Diagnosis not present

## 2020-02-15 DIAGNOSIS — N186 End stage renal disease: Secondary | ICD-10-CM | POA: Diagnosis not present

## 2020-02-15 DIAGNOSIS — Z992 Dependence on renal dialysis: Secondary | ICD-10-CM | POA: Diagnosis not present

## 2020-02-15 DIAGNOSIS — N2581 Secondary hyperparathyroidism of renal origin: Secondary | ICD-10-CM | POA: Diagnosis not present

## 2020-02-16 ENCOUNTER — Encounter (HOSPITAL_BASED_OUTPATIENT_CLINIC_OR_DEPARTMENT_OTHER): Payer: Medicare Other | Admitting: Physician Assistant

## 2020-02-16 DIAGNOSIS — D509 Iron deficiency anemia, unspecified: Secondary | ICD-10-CM | POA: Diagnosis not present

## 2020-02-16 DIAGNOSIS — Z992 Dependence on renal dialysis: Secondary | ICD-10-CM | POA: Diagnosis not present

## 2020-02-16 DIAGNOSIS — N186 End stage renal disease: Secondary | ICD-10-CM | POA: Diagnosis not present

## 2020-02-16 DIAGNOSIS — E1122 Type 2 diabetes mellitus with diabetic chronic kidney disease: Secondary | ICD-10-CM | POA: Diagnosis not present

## 2020-02-16 DIAGNOSIS — L97822 Non-pressure chronic ulcer of other part of left lower leg with fat layer exposed: Secondary | ICD-10-CM | POA: Diagnosis not present

## 2020-02-16 DIAGNOSIS — E1129 Type 2 diabetes mellitus with other diabetic kidney complication: Secondary | ICD-10-CM | POA: Diagnosis not present

## 2020-02-16 DIAGNOSIS — D689 Coagulation defect, unspecified: Secondary | ICD-10-CM | POA: Diagnosis not present

## 2020-02-16 DIAGNOSIS — D631 Anemia in chronic kidney disease: Secondary | ICD-10-CM | POA: Diagnosis not present

## 2020-02-16 DIAGNOSIS — L97512 Non-pressure chronic ulcer of other part of right foot with fat layer exposed: Secondary | ICD-10-CM | POA: Diagnosis not present

## 2020-02-16 DIAGNOSIS — N2581 Secondary hyperparathyroidism of renal origin: Secondary | ICD-10-CM | POA: Diagnosis not present

## 2020-02-16 DIAGNOSIS — E11622 Type 2 diabetes mellitus with other skin ulcer: Secondary | ICD-10-CM | POA: Diagnosis not present

## 2020-02-16 DIAGNOSIS — I120 Hypertensive chronic kidney disease with stage 5 chronic kidney disease or end stage renal disease: Secondary | ICD-10-CM | POA: Diagnosis not present

## 2020-02-16 DIAGNOSIS — E11621 Type 2 diabetes mellitus with foot ulcer: Secondary | ICD-10-CM | POA: Diagnosis not present

## 2020-02-16 DIAGNOSIS — L97812 Non-pressure chronic ulcer of other part of right lower leg with fat layer exposed: Secondary | ICD-10-CM | POA: Diagnosis not present

## 2020-02-16 DIAGNOSIS — L97312 Non-pressure chronic ulcer of right ankle with fat layer exposed: Secondary | ICD-10-CM | POA: Diagnosis not present

## 2020-02-16 DIAGNOSIS — I872 Venous insufficiency (chronic) (peripheral): Secondary | ICD-10-CM | POA: Diagnosis not present

## 2020-02-16 NOTE — Progress Notes (Signed)
[name], [name] ([id number]) Visit Report for 02/16/2020 Arrival Information Details Patient Name: Date of Service: [name] [name], [name] [name] K. 02/16/2020 9:30 A M Medical Record Number: [id number] Patient Account Number: [id number] Date of Birth/Sex: Treating RN: 12-19-1950 (69 y.o. [name] Primary Care [name]: [name] RD, [name] Other Clinician: Referring [name]: Treating [name]/Extender: [name] RD, [name] Weeks in Treatment: 1 Visit Information History Since Last Visit All ordered tests and consults were completed: No Patient Arrived: Wheel Chair Added or deleted any medications: No Arrival Time: 09:40 Any new allergies or adverse reactions: No Accompanied By: self Had a fall or experienced change in No Transfer Assistance: None activities of daily living that may affect Patient Identification Verified: Yes risk of falls: Secondary Verification Process Completed: Yes Signs or symptoms of abuse/neglect since last visito No Patient Has Alerts: Yes Hospitalized since last visit: No Patient Alerts: Right ABI: [name] Implantable device outside of the clinic excluding No Left ABI: [name] cellular tissue based products placed in the [name] since last visit: Has Dressing in Place as Prescribed: Yes Has Compression in Place as Prescribed: Yes Pain Present Now: No Electronic Signature(s) Signed: 02/16/2020 6:04:59 PM By: [name] Coria RN Entered By: [name] on 02/16/2020 09:40:37 -------------------------------------------------------------------------------- Compression Therapy Details Patient Name: Date of Service: [name] LP [name], [place] [name] K. 02/16/2020 9:30 A M Medical Record Number: [id number] Patient Account Number: [id number] Date of Birth/Sex: Treating RN: Jul 04, 1951 (69 y.o. [name] Primary Care Lacresha Fusilier: [name] RD, [name] Other Clinician: Referring Arloa Prak: Treating [name]/Extender: [name] RD, [name] Weeks in Treatment: 1 Compression  Therapy Performed for Wound Assessment: Wound #2 Left,Distal,Anterior Lower Leg Performed By: Clinician [name] Coria, RN Compression Type: Three Layer Post Procedure Diagnosis Same as Pre-procedure Electronic Signature(s) Signed: 02/16/2020 6:38:59 PM By: [name] Gouty RN, BSN Entered By: [name] on 02/16/2020 10:28:27 -------------------------------------------------------------------------------- Compression Therapy Details Patient Name: Date of Service: [name] [name], [place] [name] K. 02/16/2020 9:30 A M Medical Record Number: [id number] Patient Account Number: [id number] Date of Birth/Sex: Treating RN: 1951/02/11 (69 y.o. [name] Primary Care Mang Hazelrigg: [name] RD, [name] Other Clinician: Referring Anvay Tennis: Treating [name]/Extender: [name] RD, [name] Weeks in Treatment: 1 Compression Therapy Performed for Wound Assessment: Wound #3 Right,Medial Malleolus Performed By: [name] Church, RN Compression Type: Three Layer Post Procedure Diagnosis Same as Pre-procedure Electronic Signature(s) Signed: 02/16/2020 6:38:59 PM By: [name] Gouty RN, BSN Entered By: [name] on 02/16/2020 08:67:61 -------------------------------------------------------------------------------- Encounter Discharge Information Details Patient Name: Date of Service: [name] [name], [place] [name] K. 02/16/2020 9:30 A M Medical Record Number: [id number] Patient Account Number: [id number] Date of Birth/Sex: Treating RN: 15-Oct-1950 (69 y.o. [name] Primary Care Garnet Overfield: [name] RD, [name] Other Clinician: Referring [name]: Treating [name]/Extender: [name] RD, [name] Weeks in Treatment: 1 Encounter Discharge Information Items Post Procedure Vitals Discharge Condition: Stable Temperature (F): 98.5 Ambulatory Status: Wheelchair Pulse (bpm): 91 Discharge Destination: Home Respiratory Rate (breaths/min): 18 Transportation: Private Auto Blood Pressure  (mmHg): 114/70 Accompanied By: sister Schedule Follow-up Appointment: Yes Clinical Summary of Care: Patient Declined Electronic Signature(s) Signed: 02/16/2020 6:38:59 PM By: [name] Gouty RN, BSN Entered By: [name] on 02/16/2020 18:32:39 -------------------------------------------------------------------------------- Lower Extremity Assessment Details Patient Name: Date of Service: [name] [name], [place] [name] K. 02/16/2020 9:30 A M Medical Record Number: [id number] Patient Account Number: [id number] Date of Birth/Sex: Treating RN: 1951/01/09 (69 y.o. [name] Primary Care Jacarius Handel: [place], [place] Other  Clinician: Referring Bekka Qian: Treating [name]/Extender: [name] RD, [name] Weeks in Treatment: 1 Edema Assessment Assessed: [Left: No] [Right: No] Edema: [Left: Yes] [Right: Yes] Calf Left: Right: Point of Measurement: 38 cm From Medial Instep 37 cm 36.5 cm Ankle Left: Right: Point of Measurement: 13 cm From Medial Instep 23.3 cm 25 cm Electronic Signature(s) Signed: 02/16/2020 6:38:59 PM By: [name] Gouty RN, BSN Entered By: [name] on 02/16/2020 10:22:24 -------------------------------------------------------------------------------- Multi-Disciplinary Care Plan Details Patient Name: Date of Service: [name], [place] [name], [place] [name] K. 02/16/2020 9:30 A M Medical Record Number: [id number] Patient Account Number: [id number] Date of Birth/Sex: Treating RN: [birth year] (69 y.o. [name] Primary Care Ricci Paff: [name] RD, [name] Other Clinician: Referring Sehar Sedano: Treating Shonice Wrisley/Extender: [name] RD, [name] Weeks in Treatment: 1 Active Inactive Abuse / Safety / Falls / Self Care Management Nursing Diagnoses: Potential for falls Goals: Patient/caregiver will verbalize/demonstrate measures taken to prevent injury and/or falls Date Initiated: 02/09/2020 Target Resolution Date: 03/08/2020 Goal Status:  Active Interventions: Assess fall risk on admission and as needed Notes: Venous Leg Ulcer Nursing Diagnoses: Knowledge deficit related to disease process and management Potential for venous Insuffiency (use before diagnosis confirmed) Goals: Patient will maintain optimal edema control Date Initiated: 02/09/2020 Target Resolution Date: 03/08/2020 Goal Status: Active Patient/caregiver will verbalize understanding of disease process and disease management Date Initiated: 02/09/2020 Target Resolution Date: 03/08/2020 Goal Status: Active Interventions: Assess peripheral edema status every visit. Compression as ordered Provide education on venous insufficiency Treatment Activities: Therapeutic compression applied : 02/09/2020 Notes: Wound/Skin Impairment Nursing Diagnoses: Impaired tissue integrity Knowledge deficit related to ulceration/compromised skin integrity Goals: Patient/caregiver will verbalize understanding of skin care regimen Date Initiated: 02/09/2020 Target Resolution Date: 03/08/2020 Goal Status: Active Ulcer/skin breakdown will have a volume reduction of 30% by week 4 Date Initiated: 02/09/2020 Target Resolution Date: 03/08/2020 Goal Status: Active Interventions: Assess patient/caregiver ability to obtain necessary supplies Assess patient/caregiver ability to perform ulcer/skin care regimen upon admission and as needed Assess ulceration(s) every visit Provide education on ulcer and skin care Treatment Activities: Skin care regimen initiated : 02/09/2020 Topical wound management initiated : 02/09/2020 Notes: Electronic Signature(s) Signed: 02/16/2020 6:38:59 PM By: [name] Gouty RN, BSN Entered By: [name] on 02/16/2020 10:15:37 -------------------------------------------------------------------------------- Pain Assessment Details Patient Name: Date of Service: [name] [name], [place] [name] K. 02/16/2020 9:30 A M Medical Record Number: [id number] Patient Account Number:  [id number] Date of Birth/Sex: Treating RN: [birth year] (70 y.o. [name] Primary Care Mitsue Peery: [name] RD, [name] Other Clinician: Referring Khadeeja Elden: Treating Maliyah Willets/Extender: [name] RD, [name] Weeks in Treatment: 1 Active Problems Location of Pain Severity and Description of Pain Patient Has Paino No Site Locations Pain Management and Medication Current Pain Management: Electronic Signature(s) Signed: 02/16/2020 6:04:59 PM By: [name] Coria RN Entered By: [name] on 02/16/2020 09:41:07 -------------------------------------------------------------------------------- Patient/Caregiver Education Details Patient Name: Date of Service: [facility] [name] [name], [place] [name] K. 7/28/2021andnbsp9:30 A M Medical Record Number: [id number] Patient Account Number: [id number] Date of Birth/Gender: Treating RN: 14-May-1951 (69 y.o. [name] Primary Care Physician: [place], [name] Other Clinician: Referring Physician: Treating Physician/Extender: [name] RD, [name] Weeks in Treatment: 1 Education Assessment Education Provided To: Patient Education Topics Provided Venous: Methods: Explain/Verbal Responses: Reinforcements needed, State content correctly Wound/Skin Impairment: Methods: Explain/Verbal Responses: Reinforcements needed, State content correctly Electronic Signature(s) Signed: 02/16/2020 6:38:59 PM By: [name] Gouty RN, BSN Entered By: [name] on 02/16/2020 10:16:00 -------------------------------------------------------------------------------- Wound Assessment Details Patient  Name: Date of Service: [name] [name], [name] [name] K. 02/16/2020 9:30 A M Medical Record Number: [id number] Patient Account Number: [id number] Date of Birth/Sex: Treating RN: May 31, 1951 (69 y.o. [name] Primary Care Christain Mcraney: [name] RD, [name] Other Clinician: Referring Bryson Palen: Treating Roxie Kreeger/Extender: [name] RD, [name] Weeks in Treatment:  1 Wound Status Wound Number: 1 Primary Diabetic Wound/Ulcer of the Lower Extremity Etiology: Wound Location: Left, Proximal, Anterior Lower Leg Wound Open Wounding Event: Gradually Appeared Status: Date Acquired: 01/20/2020 Comorbid Cataracts, Anemia, Asthma, Hypertension, Type II Diabetes, End Weeks Of Treatment: 1 History: Stage Renal Disease, Osteomyelitis, Neuropathy Clustered Wound: No Wound Measurements Length: (cm) Width: (cm) Depth: (cm) Area: (cm) Volume: (cm) Wound Description Classification: Grade 2 Wound Margin: Distinct, outline attached Exudate Amount: None Present Foul Odor After Cleansing: Slough/Fibrino 0 % Reduction in Area: 100% 0 % Reduction in Volume: 100% 0 Epithelialization: Large (67-100%) 0 Tunneling: No 0 Undermining: No No No Wound Bed Granulation Amount: None Present (0%) Exposed Structure Necrotic Amount: None Present (0%) Fascia Exposed: No Fat Layer (Subcutaneous Tissue) Exposed: No Tendon Exposed: No Muscle Exposed: No Joint Exposed: No Bone Exposed: No Electronic Signature(s) Signed: 02/16/2020 6:04:59 PM By: [name] Coria RN Entered By: [name] on 02/16/2020 10:04:46 -------------------------------------------------------------------------------- Wound Assessment Details Patient Name: Date of Service: [facility] [name], [place] [name] K. 02/16/2020 9:30 A M Medical Record Number: [id number] Patient Account Number: [id number] Date of Birth/Sex: Treating RN: 11-14-1950 (69 y.o. [name] Primary Care Marcelo Ickes: [name] RD, [name] Other Clinician: Referring Trew Sunde: Treating Stacia Feazell/Extender: [name] RD, [name] Weeks in Treatment: 1 Wound Status Wound Number: 2 Primary Diabetic Wound/Ulcer of the Lower Extremity Etiology: Wound Location: Left, Distal, Anterior Lower Leg Wound Open Wounding Event: Gradually Appeared Status: Date Acquired: 01/20/2020 Comorbid Cataracts, Anemia, Asthma, Hypertension, Type II Diabetes,  End Weeks Of Treatment: 1 History: Stage Renal Disease, Osteomyelitis, Neuropathy Clustered Wound: No Wound Measurements Length: (cm) 0.1 Width: (cm) 0.1 Depth: (cm) 0.1 Area: (cm) 0.008 Volume: (cm) 0.001 % Reduction in Area: 98.4% % Reduction in Volume: 98% Epithelialization: Large (67-100%) Tunneling: No Undermining: No Wound Description Classification: Grade 2 Wound Margin: Distinct, outline attached Exudate Amount: None Present Foul Odor After Cleansing: No Slough/Fibrino No Wound Bed Granulation Amount: None Present (0%) Exposed Structure Necrotic Amount: None Present (0%) Fascia Exposed: No Fat Layer (Subcutaneous Tissue) Exposed: No Tendon Exposed: No Muscle Exposed: No Joint Exposed: No Bone Exposed: No Treatment Notes Wound #2 (Left, Distal, Anterior Lower Leg) 2. Periwound Care Moisturizing lotion 3. Primary Dressing Applied Calcium Alginate Ag 4. Secondary Dressing Dry Gauze 6. Support Layer Applied 3 layer compression Water quality scientist) Signed: 02/16/2020 6:38:59 PM By: [name] Gouty RN, BSN Entered By: [name] on 02/16/2020 10:24:11 -------------------------------------------------------------------------------- Wound Assessment Details Patient Name: Date of Service: [facility] [name], [place] [name] K. 02/16/2020 9:30 A M Medical Record Number: [id number] Patient Account Number: [id number] Date of Birth/Sex: Treating RN: 01-30-1951 (69 y.o. [name] Primary Care Austyn Seier: [name] RD, [name] Other Clinician: Referring Cordel Drewes: Treating Lacreshia Bondarenko/Extender: [name] RD, [name] Weeks in Treatment: 1 Wound Status Wound Number: 3 Primary Diabetic Wound/Ulcer of the Lower Extremity Etiology: Wound Location: Right, Medial Malleolus Wound Open Wounding Event: Gradually Appeared Status: Date Acquired: 10/21/2019 Comorbid Cataracts, Anemia, Asthma, Hypertension, Type II Diabetes, End Weeks Of Treatment: 1 History: Stage Renal  Disease, Osteomyelitis, Neuropathy Clustered Wound: Yes Wound Measurements Length: (cm) 1 Width: (cm) 0.7 Depth: (cm) 0.3 Clustered Quantity: 3 Area: (cm)  0.55 Volume: (cm) 0.165 % Reduction in Area: 91.4% % Reduction in Volume: 74.1% Epithelialization: None Tunneling: No Undermining: Yes Starting Position (o'clock): 11 Ending Position (o'clock): 11 Maximum Distance: (cm) 0.3 Wound Description Classification: Grade 2 Wound Margin: Distinct, outline attached Exudate Amount: Medium Exudate Type: Serous Exudate Color: amber Foul Odor After Cleansing: No Slough/Fibrino Yes Wound Bed Granulation Amount: Medium (34-66%) Exposed Structure Granulation Quality: Pink Fascia Exposed: No Necrotic Amount: Medium (34-66%) Fat Layer (Subcutaneous Tissue) Exposed: Yes Necrotic Quality: Eschar, Adherent Slough Tendon Exposed: No Muscle Exposed: No Joint Exposed: No Bone Exposed: No Treatment Notes Wound #3 (Right, Medial Malleolus) 2. Periwound Care Moisturizing lotion 3. Primary Dressing Applied Calcium Alginate Ag 4. Secondary Dressing Dry Gauze 6. Support Layer Applied 3 layer compression wrap Electronic Signature(s) Signed: 02/16/2020 6:04:59 PM By: [name] Coria RN Entered By: [name] on 02/16/2020 10:05:33 -------------------------------------------------------------------------------- Wound Assessment Details Patient Name: Date of Service: [name] [name], [place] [name] K. 02/16/2020 9:30 A M Medical Record Number: [id number] Patient Account Number: [id number] Date of Birth/Sex: Treating RN: 1950/12/14 (69 y.o. [name] Primary Care Erminie Foulks: [name] RD, [name] Other Clinician: Referring Corleen Otwell: Treating Andres Escandon/Extender: [name] RD, [name] Weeks in Treatment: 1 Wound Status Wound Number: 4 Primary Diabetic Wound/Ulcer of the Lower Extremity Etiology: Wound Location: Right, Medial Lower Leg Wound Open Wounding Event: Gradually  Appeared Status: Date Acquired: 10/21/2019 Comorbid Cataracts, Anemia, Asthma, Hypertension, Type II Diabetes, End Weeks Of Treatment: 1 History: Stage Renal Disease, Osteomyelitis, Neuropathy Clustered Wound: Yes Wound Measurements Length: (cm) 1 Width: (cm) 0.6 Depth: (cm) 0.1 Clustered Quantity: 3 Area: (cm) 0.471 Volume: (cm) 0.047 % Reduction in Area: 95.6% % Reduction in Volume: 97.8% Epithelialization: None Tunneling: No Undermining: No Wound Description Classification: Grade 2 Wound Margin: Distinct, outline attached Exudate Amount: Medium Exudate Type: Serous Exudate Color: amber Foul Odor After Cleansing: No Slough/Fibrino No Wound Bed Granulation Amount: Large (67-100%) Exposed Structure Necrotic Amount: None Present (0%) Fascia Exposed: No Fat Layer (Subcutaneous Tissue) Exposed: No Tendon Exposed: No Muscle Exposed: No Joint Exposed: No Bone Exposed: No Treatment Notes Wound #4 (Right, Medial Lower Leg) 2. Periwound Care Moisturizing lotion 3. Primary Dressing Applied Calcium Alginate Ag 4. Secondary Dressing Dry Gauze 6. Support Layer Applied 3 layer compression wrap Electronic Signature(s) Signed: 02/16/2020 6:04:59 PM By: [name] Coria RN Entered By: [name] on 02/16/2020 10:05:52 -------------------------------------------------------------------------------- Wound Assessment Details Patient Name: Date of Service: [name] [name], [place] [name] K. 02/16/2020 9:30 A M Medical Record Number: [id number] Patient Account Number: [id number] Date of Birth/Sex: Treating RN: 03-22-1951 (69 y.o. [name] Primary Care Ramonte Mena: [name] RD, [name] Other Clinician: Referring Kaled Allende: Treating Talah Cookston/Extender: [name] RD, [name] Weeks in Treatment: 1 Wound Status Wound Number: 5 Primary Diabetic Wound/Ulcer of the Lower Extremity Etiology: Wound Location: Right, Anterior Lower Leg Wound Open Wounding Event: Gradually  Appeared Status: Date Acquired: 10/21/2019 Comorbid Cataracts, Anemia, Asthma, Hypertension, Type II Diabetes, End Weeks Of Treatment: 1 History: Stage Renal Disease, Osteomyelitis, Neuropathy Clustered Wound: Yes Wound Measurements Length: (cm) Width: (cm) Depth: (cm) Clustered Quantity: Area: (cm) Volume: (cm) 0 % Reduction in Area: 100% 0 % Reduction in Volume: 100% 0 Epithelialization: Large (67-100%) 4 Tunneling: No 0 Undermining: No 0 Wound Description Classification: Grade 2 Wound Margin: Distinct, outline attached Exudate Amount: None Present Foul Odor After Cleansing: No Slough/Fibrino No Wound Bed Granulation Amount: None Present (0%) Exposed Structure Necrotic Amount: None Present (0%) Fascia Exposed: No Fat Layer (Subcutaneous Tissue) Exposed:  No Tendon Exposed: No Muscle Exposed: No Joint Exposed: No Bone Exposed: No Electronic Signature(s) Signed: 02/16/2020 6:04:59 PM By: [name] Coria RN Entered By: [name] on 02/16/2020 10:06:09 -------------------------------------------------------------------------------- Wound Assessment Details Patient Name: Date of Service: [facility] [name] [name], [place] [name] K. 02/16/2020 9:30 A M Medical Record Number: [id number] Patient Account Number: [id number] Date of Birth/Sex: Treating RN: 1951-03-19 (69 y.o. 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 Wound Open Wounding Event: Gradually Appeared Status: Date Acquired: 10/21/2019 Comorbid Cataracts, Anemia, Asthma, Hypertension, Type II Diabetes, End Weeks Of Treatment: 1 Weeks Of Treatment: 1 History: Stage Renal Disease, Osteomyelitis, Neuropathy Clustered Wound: No Wound Measurements Length: (cm) Width:  (cm) Depth: (cm) Area: (cm) Volume: (cm) 0 % Reduction in Area: 100% 0 % Reduction in Volume: 100% 0 Epithelialization: Large (67-100%) 0 Tunneling: No 0 Undermining: No Wound Description Classification: Grade 2 Wound Margin: Distinct, outline attached Exudate Amount: None Present Foul Odor After Cleansing: No Slough/Fibrino No Wound Bed Granulation Amount: None Present (0%) Exposed Structure Necrotic Amount: None Present (0%) Fascia Exposed: No Fat Layer (Subcutaneous Tissue) Exposed: No Tendon Exposed: No Muscle Exposed: No Joint Exposed: No Bone Exposed: No Electronic Signature(s) Signed: 02/16/2020 6:04:59 PM By: [name] Coria RN Entered By: [name] on 02/16/2020 10:06:23 -------------------------------------------------------------------------------- Wound Assessment Details Patient Name: Date of Service: [name], [place] [name] K. 02/16/2020 9:30 A M Medical Record Number: [id number] Patient Account Number: [id number] Date of Birth/Sex: Treating RN: [birth year] (69 y.o. [name] Primary Care Keyshia Orwick: [name] RD, [name] Other Clinician: Referring Deysha Cartier: Treating Morrigan Wickens/Extender: [name] RD, [name] Weeks in Treatment: 1 Wound Status Wound Number: 7 Primary Diabetic Wound/Ulcer of the Lower Extremity Etiology: Wound Location: Right T Third oe Wound Open Wounding Event: Gradually Appeared Status: Date Acquired: 02/14/2020 Comorbid Cataracts, Anemia, Asthma, Hypertension, Type II Diabetes, End Weeks Of Treatment: 0 History: Stage Renal Disease, Osteomyelitis, Neuropathy Clustered Wound: No Wound Measurements Length: (cm) 1 Width: (cm) 0.7 Depth: (cm) 0.1 Area: (cm) 0.55 Volume: (cm) 0.055 % Reduction in Area: % Reduction in Volume: Epithelialization: None Tunneling: No Undermining: No Wound Description Classification: Grade 2 Wound Margin: Flat and Intact Exudate Amount: None Present Foul Odor After Cleansing:  No Slough/Fibrino Yes Wound Bed Necrotic Amount: Large (67-100%) Exposed Structure Necrotic Quality: Eschar Fascia Exposed: No Fat Layer (Subcutaneous Tissue) Exposed: No Tendon Exposed: No Muscle Exposed: No Joint Exposed: No Bone Exposed: No Treatment Notes Wound #7 (Right Toe Third) 3. Primary Dressing Applied Foam 4. Secondary Dressing Dry Gauze Electronic Signature(s) Signed: 02/16/2020 6:04:59 PM By: [name] Coria RN Entered By: [name] on 02/16/2020 10:03:41 -------------------------------------------------------------------------------- [place] Details Patient Name: Date of Service: [name], [place] [name] K. 02/16/2020 9:30 A M Medical Record Number: [id number] Patient Account Number: [id number] Date of Birth/Sex: Treating RN: 12/29/1950 (69 y.o. [name] Primary Care Gabriell Daigneault: [place], [name] Other Clinician: Referring Liam Bossman: Treating Tamiya Colello/Extender: [name] RD, [name] Weeks in Treatment: 1 Vital Signs Time Taken: 09:40 Temperature (F): 98.5 Height (in): 67 Pulse (bpm): 91 Weight (lbs): 202 Respiratory Rate (breaths/min): 18 Body Mass Index (BMI): 31.6 Blood Pressure (mmHg): 114/70 Reference Range: 80 - 120 mg / dl Electronic Signature(s) Signed: 02/16/2020 6:04:59 PM By: [name] Coria RN Entered By: [name] on 02/16/2020  09:40:59 

## 2020-02-16 NOTE — Progress Notes (Addendum)
[name] ([id number]) Visit Report for 02/16/2020 Chief Complaint Document Details Patient Name: Date of Service: [name] [name], [name] 02/16/2020 9:30 A M Medical Record Number: [id number] Patient Account Number: [id number] Date of Birth/Sex: Treating RN: 02-01-1951 (69 y.o. [name] Primary Care Provider: [place], [name] Other Clinician: Referring Provider: Treating Provider/Extender: [name] RD, [name] Weeks in Treatment: 1 Information Obtained from: Patient Chief Complaint Multiple LE Ulcers Electronic Signature(s) Signed: 02/16/2020 10:11:46 AM By: [name] PA-C Entered By: [name] on 02/16/2020 10:11:46 -------------------------------------------------------------------------------- Debridement Details Patient Name: Date of Service: Procedure [facility] [name], [place] [name] 02/16/2020 9:30 A M Medical Record Number: [id number] Patient Account Number: [id number] Date of Birth/Sex: Treating RN: September 18, 1950 (69 y.o. [name] Primary Care Provider: [name] RD, [name] Other Clinician: Referring Provider: Treating Provider/Extender: [name] RD, [name] Weeks in Treatment: 1 Debridement Performed for Assessment: Wound #3 Right,Medial Malleolus Performed By: Physician [name], PA Debridement Type: Debridement Severity of Tissue Pre Debridement: Fat layer exposed Level of Consciousness (Pre-procedure): Awake and Alert Pre-procedure Verification/Time Out Yes - 10:23 Taken: Start Time: 10:23 Pain Control: Lidocaine 4% T opical Solution T Area Debrided (L x W): otal 1 (cm) x 1.3 (cm) = 1.3 (cm) Tissue and other material debrided: Viable, Non-Viable, Slough, Subcutaneous, Skin: Epidermis, Slough Level: Skin/Subcutaneous Tissue Debridement Description: Excisional Instrument: Curette Bleeding: Minimum Hemostasis Achieved: Pressure End Time: 10:28 Procedural Pain: 0 Post Procedural Pain: 0 Response to Treatment: Procedure was  tolerated well Level of Consciousness (Post- Awake and Alert procedure): Post Debridement Measurements of Total Wound Length: (cm) 1 Width: (cm) 1.3 Depth: (cm) 0.2 Volume: (cm) 0.204 Character of Wound/Ulcer Post Debridement: Improved Severity of Tissue Post Debridement: Fat layer exposed Post Procedure Diagnosis Same as Pre-procedure Electronic Signature(s) Signed: 02/16/2020 6:38:59 PM By: [name] RN, BSN Signed: 02/16/2020 6:41:43 PM By: [name] PA-C Entered By: [name] on 02/16/2020 10:27:23 -------------------------------------------------------------------------------- HPI Details Patient Name: Date of Service: [name], [name] [name] 02/16/2020 9:30 A M Medical Record Number: [id number] Patient Account Number: [id number] Date of Birth/Sex: Treating RN: 1950-11-30 (69 y.o. [name] Primary Care Provider: [name] RD, [name] Other Clinician: Referring Provider: Treating Provider/Extender: [name] RD, [name] Weeks in Treatment: 1 History of Present Illness HPI Description: Evaluate7/21/2021 today patient presents for evaluation of ulcerations on her legs which she tells me tend to come and go as far as small blistering locations and sometimes are better than other times. Right now she tells me that this is actually doing a little bit better but nonetheless will not completely go away. She has ulcerations bilaterally on her lower extremities. The right is worse than the left. 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 The patient has no evidence of active infection at this time. She however appears to have proficient blood flow into the extremities and I think would tolerate a 3 layer compression wrap she was noncompressible on arterial studies. Nonetheless there was no obvious signs of occlusion. 02/16/2020 on evaluation today  patient appears to be doing much better in regard to her wounds in general. On the past week she has made significant improvements which is great news there is no signs of active infection at this time. No fevers, chills, nausea, vomiting, or diarrhea. Electronic Signature(s) Signed: 02/16/2020 10:56:36 AM By: Melburn Hake,  Margarita Grizzle PA-C Entered By: [name] on 02/16/2020 10:56:36 -------------------------------------------------------------------------------- Physical Exam Details Patient Name: Date of Service: [name] [name], [name] 02/16/2020 9:30 A M Medical Record Number: [id number] Patient Account Number: [id number] Date of Birth/Sex: Treating RN: 1950/08/25 (69 y.o. [name] Primary Care Provider: [name] RD, [name] Other Clinician: Referring Provider: Treating Provider/Extender: [name] RD, [name] Weeks in Treatment: 1 Constitutional Well-nourished and well-hydrated in no acute distress. Respiratory normal breathing without difficulty. Psychiatric this patient is able to make decisions and demonstrates good insight into disease process. Alert and Oriented x 3. pleasant and cooperative. Notes Patient's wounds currently are showing signs of excellent improvement and overall I am extremely pleased with where things stand today. I do not see any evidence of worsening overall which is also great news. In general I feel like the patient is definitely headed in the right direction here. The medial ankle on the right is the one spot that seems to be somewhat lagging behind everything else but still appears to be doing excellent. I did not perform some sharp debridement here to clear away some of the where she had new skin growing underneath but nonetheless had a lip. She tolerated that today without complication. Electronic Signature(s) Signed: 02/16/2020 10:57:32 AM By: [name] PA-C Previous Signature: 02/16/2020 10:57:10 AM Version By: [name]  PA-C Entered By: [name] on 02/16/2020 10:57:31 -------------------------------------------------------------------------------- Physician Orders Details Patient Name: Date of Service: Uh [facility] [name], [place] [name] 02/16/2020 9:30 A M Medical Record Number: [id number] Patient Account Number: [id number] Date of Birth/Sex: Treating RN: [birth year] (69 y.o. [name] Primary Care Provider: [name] RD, [name] Other Clinician: Referring Provider: Treating Provider/Extender: [name] RD, [name] Weeks in Treatment: 1 Verbal / Phone Orders: No Diagnosis Coding ICD-10 Coding Code Description I87.2 Venous insufficiency (chronic) (peripheral) E11.621 Type 2 diabetes mellitus with foot ulcer L97.822 Non-pressure chronic ulcer of other part of left lower leg with fat layer exposed L97.312 Non-pressure chronic ulcer of right ankle with fat layer exposed L97.812 Non-pressure chronic ulcer of other part of right lower leg with fat layer exposed L97.512 Non-pressure chronic ulcer of other part of right foot with fat layer exposed N18.6 End stage renal disease Z99.2 Dependence on renal dialysis I10 Essential (primary) hypertension Follow-up Appointments Return Appointment in 1 week. Dressing Change Frequency Do not change entire dressing for one week. - both legs Skin Barriers/Peri-Wound Care Moisturizing lotion - both legs Wound Cleansing May shower with protection. Primary Wound Dressing Wound #2 Left,Distal,Anterior Lower Leg Calcium Alginate with Silver Wound #3 Right,Medial Malleolus Calcium Alginate with Silver Wound #4 Right,Medial Lower Leg Calcium Alginate with Silver Wound #7 Right T Third oe Foam Secondary Dressing Wound #2 Left,Distal,Anterior Lower Leg Dry Gauze Wound #3 Right,Medial Malleolus Dry Gauze Wound #4 Right,Medial Lower Leg Dry Gauze Wound #7 Right T Third oe Dry Gauze Edema Control 3 Layer Compression System - Bilateral Avoid standing  for long periods of time Elevate legs to the level of the heart or above for 30 minutes daily and/or when sitting, a frequency of: - throughout the day Exercise regularly Other: - bring juxtalites to appointment next week Electronic Signature(s) Signed: 02/16/2020 6:38:59 PM By: [name] RN, BSN Signed: 02/16/2020 6:41:43 PM By: [name] PA-C Entered By: [name] on 02/16/2020 10:31:22 -------------------------------------------------------------------------------- Problem List Details Patient Name: Date of Service: [facility] [name], [place] [name] 02/16/2020 9:30 A M Medical Record Number: [id number]  Patient Account Number: [id number] Date of Birth/Sex: Treating RN: 04-12-1951 (69 y.o. [name] Primary Care Provider: [name] RD, [name] Other Clinician: Referring Provider: Treating Provider/Extender: [name] RD, [name] Weeks in Treatment: 1 Active Problems ICD-10 Encounter Code Description Active Date MDM Diagnosis I87.2 Venous insufficiency (chronic) (peripheral) 02/09/2020 No Yes E11.621 Type 2 diabetes mellitus with foot ulcer 02/09/2020 No Yes L97.822 Non-pressure chronic ulcer of other part of left lower leg with fat layer exposed7/21/2021 No Yes L97.312 Non-pressure chronic ulcer of right ankle with fat layer exposed 02/09/2020 No Yes L97.812 Non-pressure chronic ulcer of other part of right lower leg with fat layer 02/09/2020 No Yes exposed L97.512 Non-pressure chronic ulcer of other part of right foot with fat layer exposed 02/09/2020 No Yes N18.6 End stage renal disease 02/09/2020 No Yes Z99.2 Dependence on renal dialysis 02/09/2020 No Yes I10 Essential (primary) hypertension 02/09/2020 No Yes Inactive Problems Resolved Problems Electronic Signature(s) Signed: 02/16/2020 10:11:35 AM By: [name] PA-C Entered By: [name] on 02/16/2020 10:11:34 -------------------------------------------------------------------------------- Progress  Note Details Patient Name: Date of Service: [facility] [name], [place] [name] 02/16/2020 9:30 A M Medical Record Number: [id number] Patient Account Number: [id number] Date of Birth/Sex: Treating RN: Jan 11, 1951 (69 y.o. [name] Primary Care Provider: [name] RD, [name] Other Clinician: Referring Provider: Treating Provider/Extender: [name] RD, [name] Weeks in Treatment: 1 Subjective Chief Complaint Information obtained from Patient Multiple LE Ulcers History of Present Illness (HPI) Evaluate7/21/2021 today patient presents for evaluation of ulcerations on her legs which she tells me tend to come and go as far as small blistering locations and sometimes are better than other times. Right now she tells me that this is actually doing a little bit better but nonetheless will not completely go away. She has ulcerations bilaterally on her lower extremities. The right is worse than the left. 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 The patient has no evidence of active infection at this time. She however appears to have proficient blood flow into the extremities and I think would tolerate a 3 layer compression wrap she was noncompressible on arterial studies. Nonetheless there was no obvious signs of occlusion. 02/16/2020 on evaluation today patient appears to be doing much better in regard to her wounds in general. On the past week she has made significant improvements which is great news there is no signs of active infection at this time. No fevers, chills, nausea, vomiting, or diarrhea. Objective Constitutional Well-nourished and well-hydrated in no acute distress. Vitals Time Taken: 9:40 AM, Height: 67 in, Weight: 202 lbs, BMI: 31.6, Temperature: 98.5 F, Pulse: 91 bpm, Respiratory Rate: 18 breaths/min, Blood Pressure: 114/70 mmHg. Respiratory normal breathing without  difficulty. Psychiatric this patient is able to make decisions and demonstrates good insight into disease process. Alert and Oriented x 3. pleasant and cooperative. General Notes: Patient's wounds currently are showing signs of excellent improvement and overall I am extremely pleased with where things stand today. I do not see any evidence of worsening overall which is also great news. In general I feel like the patient is definitely headed in the right direction here. The medial ankle on the right is the one spot that seems to be somewhat lagging behind everything else but still appears to be doing excellent. I did not perform some sharp debridement here to clear away some of the where  she had new skin growing underneath but nonetheless had a lip. She tolerated that today without complication. Integumentary (Hair, Skin) Wound #1 status is Open. Original cause of wound was Gradually Appeared. The wound is located on the Left,Proximal,Anterior Lower Leg. The wound measures 0cm length x 0cm width x 0cm depth; 0cm^2 area and 0cm^3 volume. There is no tunneling or undermining noted. There is a none present amount of drainage noted. The wound margin is distinct with the outline attached to the wound base. There is no granulation within the wound bed. There is no necrotic tissue within the wound bed. Wound #2 status is Open. Original cause of wound was Gradually Appeared. The wound is located on the [facility] Lower Leg. The wound measures 0.1cm length x 0.1cm width x 0.1cm depth; 0.008cm^2 area and 0.001cm^3 volume. There is no tunneling or undermining noted. There is a none present amount of drainage noted. The wound margin is distinct with the outline attached to the wound base. There is no granulation within the wound bed. There is no necrotic tissue within the wound bed. Wound #3 status is Open. Original cause of wound was Gradually Appeared. The wound is located on the Right,Medial  Malleolus. The wound measures 1cm length x 0.7cm width x 0.3cm depth; 0.55cm^2 area and 0.165cm^3 volume. There is Fat Layer (Subcutaneous Tissue) Exposed exposed. There is no tunneling noted, however, there is undermining starting at 11:00 and ending at 11:00 with a maximum distance of 0.3cm. There is a medium amount of serous drainage noted. The wound margin is distinct with the outline attached to the wound base. There is medium (34-66%) pink granulation within the wound bed. There is a medium (34-66%) amount of necrotic tissue within the wound bed including Eschar and Adherent Slough. Wound #4 status is Open. Original cause of wound was Gradually Appeared. The wound is located on the Right,Medial Lower Leg. The wound measures 1cm length x 0.6cm width x 0.1cm depth; 0.471cm^2 area and 0.047cm^3 volume. There is no tunneling or undermining noted. There is a medium amount of serous drainage noted. The wound margin is distinct with the outline attached to the wound base. There is large (67-100%) granulation within the wound bed. There is no necrotic tissue within the wound bed. Wound #5 status is Open. Original cause of wound was Gradually Appeared. The wound is located on the Right,Anterior Lower Leg. The wound measures 0cm length x 0cm width x 0cm depth; 0cm^2 area and 0cm^3 volume. There is no tunneling or undermining noted. There is a none present amount of drainage noted. The wound margin is distinct with the outline attached to the wound base. There is no granulation within the wound bed. There is no necrotic tissue within the wound bed. Wound #6 status is Open. Original cause of wound was Gradually Appeared. The wound is located on the Right Amputation Site - T The wound measures oe. 0cm length x 0cm width x 0cm depth; 0cm^2 area and 0cm^3 volume. There is no tunneling or undermining noted. There is a none present amount of drainage noted. The wound margin is distinct with the outline attached  to the wound base. There is no granulation within the wound bed. There is no necrotic tissue within the wound bed. Wound #7 status is Open. Original cause of wound was Gradually Appeared. The wound is located on the Right T Third. The wound measures 1cm length x oe 0.7cm width x 0.1cm depth; 0.55cm^2 area and 0.055cm^3 volume. There is no tunneling or  undermining noted. There is a none present amount of drainage noted. The wound margin is flat and intact. There is a large (67-100%) amount of necrotic tissue within the wound bed including Eschar. 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 There was a Excisional Skin/Subcutaneous Tissue Debridement with a total area of 1.3 sq cm performed by [name], PA. With the following instrument(s): Curette to remove Viable and Non-Viable tissue/material. Material removed includes Subcutaneous Tissue, Slough, and Skin: Epidermis after achieving pain control using Lidocaine 4% Topical Solution. No specimens were taken. A time out was conducted at 10:23, prior to the start of the procedure. A Minimum amount of bleeding was controlled with Pressure. The procedure was tolerated well with a pain level of 0 throughout and a pain level of 0 following the procedure. Post Debridement Measurements:  1cm length x 1.3cm width x 0.2cm depth; 0.204cm^3 volume. Character of Wound/Ulcer Post Debridement is improved. Severity of Tissue Post Debridement is: Fat layer exposed. Post procedure Diagnosis Wound #3: Same as Pre-Procedure Pre-procedure diagnosis of Wound #3 is a Diabetic Wound/Ulcer of the Lower Extremity located on the Right,Medial Malleolus . There was a Three Layer Compression Therapy Procedure by [name], RN. Post procedure Diagnosis Wound #3: Same as Pre-Procedure Wound #2 Pre-procedure diagnosis of Wound #2 is a Diabetic Wound/Ulcer of the Lower Extremity located on the Left,Distal,Anterior Lower Leg . There was a Three Layer Compression Therapy Procedure by [name], RN. Post procedure Diagnosis Wound #2: Same as Pre-Procedure Plan Follow-up Appointments: Return Appointment in 1 week. Dressing Change Frequency: Do not change entire dressing for one week. - both legs Skin Barriers/Peri-Wound Care: Moisturizing lotion - both legs Wound Cleansing: May shower with protection. Primary Wound Dressing: Wound #2 Left,Distal,Anterior Lower Leg: Calcium Alginate with Silver Wound #3 Right,Medial Malleolus: Calcium Alginate with Silver Wound #4 Right,Medial Lower Leg: Calcium Alginate with Silver Wound #7 Right T Third: oe Foam Secondary Dressing: Wound #2 Left,Distal,Anterior Lower Leg: Dry Gauze Wound #3 Right,Medial Malleolus: Dry Gauze Wound #4 Right,Medial Lower Leg: Dry Gauze Wound #7 Right T Third: oe Dry Gauze Edema Control: 3 Layer Compression System - Bilateral Avoid standing for long periods of time Elevate legs to the level of the heart or above for 30 minutes daily and/or when sitting, a frequency of: - throughout the day Exercise regularly Other: - bring juxtalites to appointment next week 1. I would recommend currently that we continue with the wound care measures as before utilizing the silver alginate dressing that seems to have  done excellent for her. 2. I am also can recommend we continue with the bilateral 3 layer compression wraps. I believe that this is doing a great job and hopefully she will continue to show signs of improvement. 3. Were also can order juxta lite compression wraps for her for ongoing treatment that way she can transition into those when she heals and the patient is in  agreement with that plan we will work on getting that for her over the next week. 4. She does need to continue to elevate her legs is much as possible try to keep edema under good control. We will see patient back for reevaluation in 1 week here in the clinic. If anything worsens or changes patient will contact our office for additional recommendations. Electronic Signature(s) Signed: 02/16/2020 1:15:25 PM By: [name] PA-C Entered By: [name] on 02/16/2020 13:15:24 -------------------------------------------------------------------------------- SuperBill Details Patient Name: Date of Service: [facility] [name], [place] [name] 02/16/2020 Medical Record Number: [id number] Patient Account Number: [id number] Date of Birth/Sex: Treating RN: 1950-12-23 (69 y.o. [name] Primary Care Provider: [name] RD, [name] Other Clinician: Referring Provider: Treating Provider/Extender: [name] RD, [name] Weeks in Treatment: 1 Diagnosis Coding ICD-10 Codes Code Description I87.2 Venous insufficiency (chronic) (peripheral) E11.621 Type 2 diabetes mellitus with foot ulcer L97.822 Non-pressure chronic ulcer of other part of left lower leg with fat layer exposed L97.312 Non-pressure chronic ulcer of right ankle with fat layer exposed L97.812 Non-pressure chronic ulcer of other part of right lower leg with fat layer exposed L97.512 Non-pressure chronic ulcer of other part of right foot with fat layer exposed N18.6 End stage renal disease Z99.2 Dependence on renal dialysis I10 Essential (primary) hypertension Facility  Procedures CPT4 Code: [id number] (F Description: 88502 - DEB SUBQ TISSUE 20 SQ CM/< ICD-10 Diagnosis Description L97.812 Non-pressure chronic ulcer of other part of right lower leg with fat layer exposed acility Use Only) 29581LT - APPLY MULTLAY COMPRS LWR LT LEG Modifier: 59 1 Quantity: 1 Physician Procedures : CPT4 Code Description Modifier [id number] - WC PHYS LEVEL 3 - EST PT 25 ICD-10 Diagnosis Description E11.621 Type 2 diabetes mellitus with foot ulcer I87.2 Venous insufficiency (chronic) (peripheral) L97.822 Non-pressure chronic ulcer of other part  of left lower leg with fat layer exposed L97.312 Non-pressure chronic ulcer of right ankle with fat layer exposed Quantity: 1 : [id number] 11042 - WC PHYS SUBQ TISS 20 SQ CM ICD-10 Diagnosis Description G28.366 Non-pressure chronic ulcer of other part of right lower leg with fat layer exposed Quantity: 1 Electronic Signature(s) Signed: 02/16/2020 1:15:49 PM By: [name] PA-C Entered By: [name] on 02/16/2020 13:15:49

## 2020-02-19 DIAGNOSIS — N2581 Secondary hyperparathyroidism of renal origin: Secondary | ICD-10-CM | POA: Diagnosis not present

## 2020-02-19 DIAGNOSIS — D631 Anemia in chronic kidney disease: Secondary | ICD-10-CM | POA: Diagnosis not present

## 2020-02-19 DIAGNOSIS — I129 Hypertensive chronic kidney disease with stage 1 through stage 4 chronic kidney disease, or unspecified chronic kidney disease: Secondary | ICD-10-CM | POA: Diagnosis not present

## 2020-02-19 DIAGNOSIS — D689 Coagulation defect, unspecified: Secondary | ICD-10-CM | POA: Diagnosis not present

## 2020-02-19 DIAGNOSIS — D509 Iron deficiency anemia, unspecified: Secondary | ICD-10-CM | POA: Diagnosis not present

## 2020-02-19 DIAGNOSIS — Z992 Dependence on renal dialysis: Secondary | ICD-10-CM | POA: Diagnosis not present

## 2020-02-19 DIAGNOSIS — E1129 Type 2 diabetes mellitus with other diabetic kidney complication: Secondary | ICD-10-CM | POA: Diagnosis not present

## 2020-02-19 DIAGNOSIS — N186 End stage renal disease: Secondary | ICD-10-CM | POA: Diagnosis not present

## 2020-02-22 DIAGNOSIS — D689 Coagulation defect, unspecified: Secondary | ICD-10-CM | POA: Diagnosis not present

## 2020-02-22 DIAGNOSIS — N186 End stage renal disease: Secondary | ICD-10-CM | POA: Diagnosis not present

## 2020-02-22 DIAGNOSIS — D509 Iron deficiency anemia, unspecified: Secondary | ICD-10-CM | POA: Diagnosis not present

## 2020-02-22 DIAGNOSIS — D631 Anemia in chronic kidney disease: Secondary | ICD-10-CM | POA: Diagnosis not present

## 2020-02-22 DIAGNOSIS — N2581 Secondary hyperparathyroidism of renal origin: Secondary | ICD-10-CM | POA: Diagnosis not present

## 2020-02-22 DIAGNOSIS — Z992 Dependence on renal dialysis: Secondary | ICD-10-CM | POA: Diagnosis not present

## 2020-02-23 ENCOUNTER — Encounter (HOSPITAL_BASED_OUTPATIENT_CLINIC_OR_DEPARTMENT_OTHER): Payer: Medicare Other | Attending: Physician Assistant | Admitting: Physician Assistant

## 2020-02-23 ENCOUNTER — Other Ambulatory Visit: Payer: Self-pay

## 2020-02-23 DIAGNOSIS — L97312 Non-pressure chronic ulcer of right ankle with fat layer exposed: Secondary | ICD-10-CM | POA: Insufficient documentation

## 2020-02-23 DIAGNOSIS — I120 Hypertensive chronic kidney disease with stage 5 chronic kidney disease or end stage renal disease: Secondary | ICD-10-CM | POA: Diagnosis not present

## 2020-02-23 DIAGNOSIS — E11621 Type 2 diabetes mellitus with foot ulcer: Secondary | ICD-10-CM | POA: Insufficient documentation

## 2020-02-23 DIAGNOSIS — I872 Venous insufficiency (chronic) (peripheral): Secondary | ICD-10-CM | POA: Insufficient documentation

## 2020-02-23 DIAGNOSIS — E1122 Type 2 diabetes mellitus with diabetic chronic kidney disease: Secondary | ICD-10-CM | POA: Insufficient documentation

## 2020-02-23 DIAGNOSIS — N186 End stage renal disease: Secondary | ICD-10-CM | POA: Insufficient documentation

## 2020-02-23 DIAGNOSIS — L97812 Non-pressure chronic ulcer of other part of right lower leg with fat layer exposed: Secondary | ICD-10-CM | POA: Diagnosis not present

## 2020-02-23 DIAGNOSIS — E11622 Type 2 diabetes mellitus with other skin ulcer: Secondary | ICD-10-CM | POA: Insufficient documentation

## 2020-02-23 DIAGNOSIS — Z992 Dependence on renal dialysis: Secondary | ICD-10-CM | POA: Diagnosis not present

## 2020-02-23 DIAGNOSIS — L97512 Non-pressure chronic ulcer of other part of right foot with fat layer exposed: Secondary | ICD-10-CM | POA: Diagnosis not present

## 2020-02-23 DIAGNOSIS — L97822 Non-pressure chronic ulcer of other part of left lower leg with fat layer exposed: Secondary | ICD-10-CM | POA: Insufficient documentation

## 2020-02-23 NOTE — Progress Notes (Addendum)
[name], [name] ([id number]) Visit Report for 02/23/2020 Chief Complaint Document Details Patient Name: Date of Service: [name] [name], [name] 02/23/2020 10:45 A M Medical Record Number: [id number] Patient Account Number: [id number] Date of Birth/Sex: Treating RN: [birth year] (69 y.o. [name] Primary Care Provider: [place], [name] Other Clinician: Referring Provider: Treating Provider/Extender: [name] RD, [name] Weeks in Treatment: 2 Information Obtained from: Patient Chief Complaint Multiple LE Ulcers Electronic Signature(s) Signed: 02/23/2020 11:31:38 AM By: [name] PA-C Entered By: [name] on 02/23/2020 11:31:38 -------------------------------------------------------------------------------- Debridement Details Patient Name: Date of Service: [name], [place] [name] 02/23/2020 10:45 A M Medical Record Number: [id number] Patient Account Number: [id number] Date of Birth/Sex: Treating RN: [birth year] (69 y.o. [name] Primary Care Provider: [name] RD, [name] Other Clinician: Referring Provider: Treating Provider/Extender: [name] RD, [name] Weeks in Treatment: 2 Debridement Performed for Assessment: Wound #3 Right,Medial Malleolus Performed By: Physician [name], PA Debridement Type: Debridement Severity of Tissue Pre Debridement: Fat layer exposed Level of Consciousness (Pre-procedure): Awake and Alert Pre-procedure Verification/Time Out Yes - 12:30 Taken: Start Time: 12:32 Pain Control: Other : benzocaine 20% spray T Area Debrided (L x W): otal 0.4 (cm) x 0.4 (cm) = 0.16 (cm) Tissue and other material debrided: Non-Viable, Callus, Skin: Epidermis, Fibrin/Exudate Level: Skin/Epidermis Debridement Description: Selective/Open Wound Instrument: Curette Bleeding: None End Time: 12:34 Procedural Pain: 0 Post Procedural Pain: 0 Response to Treatment: Procedure was tolerated well Level of Consciousness (Post- Awake  and Alert procedure): Post Debridement Measurements of Total Wound Length: (cm) 0.2 Width: (cm) 0.2 Depth: (cm) 0.2 Volume: (cm) 0.006 Character of Wound/Ulcer Post Debridement: Improved Severity of Tissue Post Debridement: Fat layer exposed Post Procedure Diagnosis Same as Pre-procedure Electronic Signature(s) Signed: 02/23/2020 4:54:27 PM By: [name] RN, BSN Signed: 02/23/2020 5:10:43 PM By: [name] PA-C Entered By: [name] on 02/23/2020 12:36:17 -------------------------------------------------------------------------------- HPI Details Patient Name: Date of Service: [name], [name] 02/23/2020 10:45 A M Medical Record Number: [id number] Patient Account Number: [id number] Date of Birth/Sex: Treating RN: 1951-06-27 (69 y.o. [name] Primary Care Provider: [name] RD, [name] Other Clinician: Referring Provider: Treating Provider/Extender: [name] RD, [name] Weeks in Treatment: 2 History of Present Illness HPI Description: Evaluate7/21/2021 today patient presents for evaluation of ulcerations on her legs which she tells me tend to come and go as far as small blistering locations and sometimes are better than other times. Right now she tells me that this is actually doing a little bit better but nonetheless will not completely go away. She has ulcerations bilaterally on her lower extremities. The right is worse than the left. 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 The patient has no evidence of active infection at this time. She however appears to have proficient blood flow into the extremities and I think would tolerate a 3 layer compression wrap she was noncompressible on arterial studies. Nonetheless there was no obvious signs of occlusion. 02/16/2020 on evaluation today patient appears to be doing much better in regard to  her wounds in general. On the past week she has made significant improvements which is great news there is no signs of active infection at this time. No fevers, chills, nausea, vomiting, or diarrhea. 02/23/2023 on evaluation today patient appears to be doing well in regard to her  lower extremity ulcers and ankle ulcers. Overall I feel like she is making great progress and in general I am extremely pleased with how things stand. There is no sign of active infection at this time which is also good news. She will require slight debridement around the right ankle region. Electronic Signature(s) Signed: 02/23/2020 4:56:40 PM By: [name] PA-C Entered By: [name] on 02/23/2020 16:56:40 -------------------------------------------------------------------------------- Physical Exam Details Patient Name: Date of Service: [name] [name], [name] 02/23/2020 10:45 A M Medical Record Number: [id number] Patient Account Number: [id number] Date of Birth/Sex: Treating RN: [birth year] (69 y.o. [name] Primary Care Provider: [name] RD, [name] Other Clinician: Referring Provider: Treating Provider/Extender: [name] RD, [name] Weeks in Treatment: 2 Constitutional Well-nourished and well-hydrated in no acute distress. Respiratory normal breathing without difficulty. Psychiatric this patient is able to make decisions and demonstrates good insight into disease process. Alert and Oriented x 3. pleasant and cooperative. Notes Patient's wounds currently appear to be for the most part completely resolved she did have a small area on her left third toe on the dorsal surface and subsequently the right medial malleolus which is actually doing quite a bit better this is great news. Electronic Signature(s) Signed: 02/23/2020 4:57:02 PM By: [name] PA-C Entered By: [name] on 02/23/2020  16:57:02 -------------------------------------------------------------------------------- Physician Orders Details Patient Name: Date of Service: [facility] [name], [place] [name] 02/23/2020 10:45 A M Medical Record Number: [id number] Patient Account Number: [id number] Date of Birth/Sex: Treating RN: [birth year] (69 y.o. [name] Primary Care Provider: [name] RD, [name] Other Clinician: Referring Provider: Treating Provider/Extender: [name] RD, [name] Weeks in Treatment: 2 Verbal / Phone Orders: No Diagnosis Coding ICD-10 Coding Code Description I87.2 Venous insufficiency (chronic) (peripheral) E11.621 Type 2 diabetes mellitus with foot ulcer L97.822 Non-pressure chronic ulcer of other part of left lower leg with fat layer exposed L97.312 Non-pressure chronic ulcer of right ankle with fat layer exposed L97.812 Non-pressure chronic ulcer of other part of right lower leg with fat layer exposed L97.512 Non-pressure chronic ulcer of other part of right foot with fat layer exposed N18.6 End stage renal disease Z99.2 Dependence on renal dialysis I10 Essential (primary) hypertension Follow-up Appointments Return Appointment in 1 week. Dressing Change Frequency Wound #3 Right,Medial Malleolus Do not change entire dressing for one week. Wound #8 Left T Third oe Change Dressing every other day. Skin Barriers/Peri-Wound Care Moisturizing lotion - both legs Wound Cleansing Wound #3 Right,Medial Malleolus May shower with protection. Wound #8 Left T Third oe May shower and wash wound with soap and water. Primary Wound Dressing Wound #3 Right,Medial Malleolus Calcium Alginate with Silver Wound #8 Left T Third oe Calcium Alginate with Silver Secondary Dressing Wound #3 Right,Medial Malleolus Dry Gauze Wound #8 Left T Third oe Other: - bandaid or rolled gauze to secure Edema Control 3 Layer Compression System - Right Lower Extremity Avoid standing for long periods of  time Elevate legs to the level of the heart or above for 30 minutes daily and/or when sitting, a frequency of: - throughout the day Exercise regularly Support Garment 20-30 mm/Hg pressure to: - juxtalite compression garment left leg daily Electronic Signature(s) Signed: 02/23/2020 4:54:27 PM By: [name] RN, BSN Signed: 02/23/2020 5:10:43 PM By: [name] PA-C Entered By: [name] on 02/23/2020 12:38:47 -------------------------------------------------------------------------------- Problem List Details Patient Name: Date of Service: [name], [place] [name] 02/23/2020 10:45 A M Medical Record  Number: [id number] Patient Account Number: [id number] Date of Birth/Sex: Treating RN: 08/09/1950 (69 y.o. [name] Primary Care Provider: [name] RD, [name] Other Clinician: Referring Provider: Treating Provider/Extender: [name] RD, [name] Weeks in Treatment: 2 Active Problems ICD-10 Encounter Code Description Active Date MDM Diagnosis I87.2 Venous insufficiency (chronic) (peripheral) 02/09/2020 No Yes E11.621 Type 2 diabetes mellitus with foot ulcer 02/09/2020 No Yes L97.822 Non-pressure chronic ulcer of other part of left lower leg with fat layer exposed7/21/2021 No Yes L97.312 Non-pressure chronic ulcer of right ankle with fat layer exposed 02/09/2020 No Yes L97.812 Non-pressure chronic ulcer of other part of right lower leg with fat layer 02/09/2020 No Yes exposed L97.512 Non-pressure chronic ulcer of other part of right foot with fat layer exposed 02/09/2020 No Yes N18.6 End stage renal disease 02/09/2020 No Yes Z99.2 Dependence on renal dialysis 02/09/2020 No Yes I10 Essential (primary) hypertension 02/09/2020 No Yes Inactive Problems Resolved Problems Electronic Signature(s) Signed: 02/23/2020 11:31:31 AM By: [name] PA-C Entered By: [name] on 02/23/2020  11:31:31 -------------------------------------------------------------------------------- Progress Note Details Patient Name: Date of Service: [facility] [name], [name] 02/23/2020 10:45 A M Medical Record Number: [id number] Patient Account Number: [id number] Date of Birth/Sex: Treating RN: [birth year] (69 y.o. [name] Primary Care Provider: [name] RD, [name] Other Clinician: Referring Provider: Treating Provider/Extender: [name] RD, [name] Weeks in Treatment: 2 Subjective Chief Complaint Information obtained from Patient Multiple LE Ulcers History of Present Illness (HPI) Evaluate7/21/2021 today patient presents for evaluation of ulcerations on her legs which she tells me tend to come and go as far as small blistering locations and sometimes are better than other times. Right now she tells me that this is actually doing a little bit better but nonetheless will not completely go away. She has ulcerations bilaterally on her lower extremities. The right is worse than the left. 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 The patient has no evidence of active infection at this time. She however appears to have proficient blood flow into the extremities and I think would tolerate a 3 layer compression wrap she was noncompressible on arterial studies. Nonetheless there was no obvious signs of occlusion. 02/16/2020 on evaluation today patient appears to be doing much better in regard to her wounds in general. On the past week she has made significant improvements which is great news there is no signs of active infection at this time. No fevers, chills, nausea, vomiting, or diarrhea. 02/23/2023 on evaluation today patient appears to be doing well in regard to her lower extremity ulcers and ankle ulcers. Overall I feel like she is making great progress and in general I am  extremely pleased with how things stand. There is no sign of active infection at this time which is also good news. She will require slight debridement around the right ankle region. Objective Constitutional Well-nourished and well-hydrated in no acute distress. Vitals Time Taken: 11:35 AM, Height: 67 in, Weight: 202 lbs, BMI: 31.6, Temperature: 98.3 F, Pulse: 87 bpm, Respiratory Rate: 18 breaths/min, Blood Pressure: 112/71 mmHg. Respiratory normal breathing without difficulty. Psychiatric this patient is able to make decisions and demonstrates good insight into disease process. Alert and Oriented x 3. pleasant and cooperative. General Notes: Patient's wounds currently appear to be for the most part completely resolved she did have a small area on her left third toe  on the dorsal surface and subsequently the right medial malleolus which is actually doing quite a bit better this is great news. Integumentary (Hair, Skin) Wound #2 status is Healed - Epithelialized. Original cause of wound was Gradually Appeared. The wound is located on the [facility] Lower Leg. The wound measures 0cm length x 0cm width x 0cm depth; 0cm^2 area and 0cm^3 volume. There is no tunneling or undermining noted. There is a none present amount of drainage noted. The wound margin is distinct with the outline attached to the wound base. There is no granulation within the wound bed. There is no necrotic tissue within the wound bed. Wound #3 status is Open. Original cause of wound was Gradually Appeared. The wound is located on the Right,Medial Malleolus. The wound measures 0.4cm length x 0.4cm width x 0.2cm depth; 0.126cm^2 area and 0.025cm^3 volume. There is Fat Layer (Subcutaneous Tissue) Exposed exposed. There is no tunneling or undermining noted. There is a medium amount of serous drainage noted. The wound margin is distinct with the outline attached to the wound base. There is small (1-33%) pink, pale  granulation within the wound bed. There is a large (67-100%) amount of necrotic tissue within the wound bed including Adherent Slough. Wound #4 status is Healed - Epithelialized. Original cause of wound was Gradually Appeared. The wound is located on the Right,Medial Lower Leg. The wound measures 0cm length x 0cm width x 0cm depth; 0cm^2 area and 0cm^3 volume. There is no tunneling or undermining noted. There is a none present amount of drainage noted. The wound margin is distinct with the outline attached to the wound base. There is no granulation within the wound bed. There is no necrotic tissue within the wound bed. Wound #7 status is Open. Original cause of wound was Gradually Appeared. The wound is located on the Right T Third. The wound measures 0cm length x oe 0cm width x 0cm depth; 0cm^2 area and 0cm^3 volume. There is no tunneling or undermining noted. There is a none present amount of drainage noted. The wound margin is flat and intact. There is no granulation within the wound bed. There is no necrotic tissue within the wound bed. Wound #8 status is Open. Original cause of wound was Blister. The wound is located on the Left T Third. The wound measures 0.3cm length x 0.6cm width x oe 0.1cm depth; 0.141cm^2 area and 0.014cm^3 volume. There is no tunneling or undermining noted. There is a medium amount of serous drainage noted. The wound margin is flat and intact. There is no granulation within the wound bed. There is a large (67-100%) amount of necrotic tissue within the wound bed including Adherent Slough. 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 There was a Selective/Open Wound Skin/Epidermis Debridement with a total area of 0.16 sq cm performed by [name], PA. With the following instrument(s): Curette to remove Non-Viable tissue/material. Material removed includes Callus,  Skin: Epidermis, and Fibrin/Exudate after achieving pain control using Other (benzocaine 20% spray). No specimens were taken. A time out was conducted at 12:30, prior to the start of the procedure. There was no bleeding. The procedure was tolerated well with a pain level of 0 throughout and a pain level of 0 following the procedure. Post Debridement Measurements: 0.2cm length x 0.2cm width x 0.2cm depth; 0.006cm^3 volume. Character of Wound/Ulcer Post Debridement is improved. Severity of Tissue Post Debridement is: Fat layer exposed. Post procedure Diagnosis Wound #3: Same as Pre-Procedure Pre-procedure diagnosis of Wound #3 is a Diabetic Wound/Ulcer of the Lower Extremity located on the Right,Medial Malleolus . There was a Three Layer Compression Therapy Procedure by [name], RN. Post procedure Diagnosis Wound #3: Same as Pre-Procedure Plan Follow-up Appointments: Return Appointment in 1 week. Dressing Change Frequency: Wound #3 Right,Medial Malleolus: Do not change entire dressing for one week. Wound #8 Left T Third: oe Change Dressing every other day. Skin Barriers/Peri-Wound Care: Moisturizing lotion - both legs Wound Cleansing: Wound #3 Right,Medial Malleolus: May shower with protection. Wound #8 Left T Third: oe May shower and wash wound with soap and water. Primary Wound Dressing: Wound #3 Right,Medial  Malleolus: Calcium Alginate with Silver Wound #8 Left T Third: oe Calcium Alginate with Silver Secondary Dressing: Wound #3 Right,Medial Malleolus: Dry Gauze Wound #8 Left T Third: oe Other: - bandaid or rolled gauze to secure Edema Control: 3 Layer Compression System - Right Lower Extremity Avoid standing for long periods of time Elevate legs to the level of the heart or above for 30 minutes daily and/or when sitting, a frequency of: - throughout the day Exercise regularly Support Garment 20-30 mm/Hg pressure to: - juxtalite compression garment left leg daily 1. I would recommend currently that we go and continue with the wound care measures as before utilizing silver alginate and I think that still the best way to go. 2. I am also can recommend the patient continue with the compression wrap on the right lower extremity this is a 3 layer compression wrap. In regard to left lower extremity we will transition to her juxta lite compression stockings at this point. 3. I am also can recommend that the patient continue to elevate her legs as much as possible to try to keep edema under good control I think that still of utmost importance. We will see patient back for reevaluation in 1 week here in the clinic. If anything worsens or changes patient will contact our office for additional recommendations. Electronic Signature(s) Signed: 02/23/2020 4:57:47 PM By: [name] PA-C Entered By: [name] on 02/23/2020 16:57:46 -------------------------------------------------------------------------------- SuperBill Details Patient Name: Date of Service: [facility] [name], [place] [name] 02/23/2020 Medical Record Number: [id number] Patient Account Number: [id number] Date of Birth/Sex: Treating RN: [birth year] (69 y.o. [name] Primary Care Provider: [name] RD, [name] Other Clinician: Referring Provider: Treating Provider/Extender: [name] RD, [name] Weeks in Treatment:  2 Diagnosis Coding ICD-10 Codes Code Description I87.2 Venous insufficiency (chronic) (peripheral) E11.621 Type 2 diabetes mellitus with foot ulcer L97.822 Non-pressure chronic ulcer of other part of left lower leg with fat layer exposed L97.312 Non-pressure chronic ulcer of right ankle with fat layer exposed L97.812 Non-pressure chronic ulcer of other part of right lower leg with fat layer exposed L97.512 Non-pressure chronic ulcer of other part of right foot with fat layer exposed N18.6 End stage renal disease Z99.2 Dependence on renal dialysis I10 Essential (primary) hypertension Facility Procedures CPT4  Code: [id number] Description: 37023 - DEBRIDE WOUND 1ST 20 SQ CM OR < ICD-10 Diagnosis Description W17.209 Non-pressure chronic ulcer of right ankle with fat layer exposed Modifier: Quantity: 1 Physician Procedures : CPT4 Code Description Modifier [id number] - WC PHYS DEBR WO ANESTH 20 SQ CM ICD-10 Diagnosis Description K98.286 Non-pressure chronic ulcer of right ankle with fat layer exposed Quantity: 1 Electronic Signature(s) Signed: 02/23/2020 4:58:01 PM By: [name] PA-C Previous Signature: 02/23/2020 4:54:27 PM Version By: [name] RN, BSN Entered By: [name] on 02/23/2020 16:58:00

## 2020-02-24 DIAGNOSIS — D509 Iron deficiency anemia, unspecified: Secondary | ICD-10-CM | POA: Diagnosis not present

## 2020-02-24 DIAGNOSIS — N2581 Secondary hyperparathyroidism of renal origin: Secondary | ICD-10-CM | POA: Diagnosis not present

## 2020-02-24 DIAGNOSIS — D631 Anemia in chronic kidney disease: Secondary | ICD-10-CM | POA: Diagnosis not present

## 2020-02-24 DIAGNOSIS — N186 End stage renal disease: Secondary | ICD-10-CM | POA: Diagnosis not present

## 2020-02-24 DIAGNOSIS — D689 Coagulation defect, unspecified: Secondary | ICD-10-CM | POA: Diagnosis not present

## 2020-02-24 DIAGNOSIS — Z992 Dependence on renal dialysis: Secondary | ICD-10-CM | POA: Diagnosis not present

## 2020-02-26 DIAGNOSIS — D689 Coagulation defect, unspecified: Secondary | ICD-10-CM | POA: Diagnosis not present

## 2020-02-26 DIAGNOSIS — D509 Iron deficiency anemia, unspecified: Secondary | ICD-10-CM | POA: Diagnosis not present

## 2020-02-26 DIAGNOSIS — N186 End stage renal disease: Secondary | ICD-10-CM | POA: Diagnosis not present

## 2020-02-26 DIAGNOSIS — N2581 Secondary hyperparathyroidism of renal origin: Secondary | ICD-10-CM | POA: Diagnosis not present

## 2020-02-26 DIAGNOSIS — Z992 Dependence on renal dialysis: Secondary | ICD-10-CM | POA: Diagnosis not present

## 2020-02-26 DIAGNOSIS — D631 Anemia in chronic kidney disease: Secondary | ICD-10-CM | POA: Diagnosis not present

## 2020-02-28 NOTE — Chronic Care Management (AMB) (Addendum)
Chronic Care Management Pharmacy  Name: [name]  MRN: [id number] DOB: [birth year]  Chief Complaint/ HPI  [name],  69 y.o. , female presents for their Initial CCM visit with the clinical pharmacist In office.  PCP : [name], MD  Their chronic conditions include: hypertension, Type II DM, hyperlipidemia.   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    Blood Glucose Monitoring Suppl (ONE TOUCH ULTRA 2) w/Device KIT Use to check BS BID-QID Dx:E11.9   BREO ELLIPTA 100-25 MCG/INH AEPB Inhale 1 puff by mouth once daily (Patient taking differently: Inhale 1 puff into the lungs daily. )   Carboxymethylcellulose Sodium (THERATEARS OP) Place 1 drop into both eyes daily as needed (dry eyes).   diphenhydrAMINE (BENADRYL) 25 MG tablet Take 25 mg by mouth daily as needed for allergies.   gabapentin (NEURONTIN) 300 MG capsule TAKE 1 CAPSULE BY MOUTH THREE TIMES DAILY   insulin degludec (TRESIBA FLEXTOUCH) 100 UNIT/ML FlexTouch Pen Inject 0.3 mLs (30 Units total) into the skin daily. Hold if fasting blood sugars are <130.   Insulin Pen Needle (LIVE BETTER PEN NEEDLES) 31G X 6 MM MISC To use with Tresiba pens daily   Lancets (ONETOUCH ULTRASOFT) lancets Use as instructed   lidocaine-prilocaine (EMLA) cream Apply 1 application topically as needed (port access).   Methoxy PEG-Epoetin Beta (MIRCERA IJ) Mircera   multivitamin (RENA-VIT) TABS tablet Take 1 tablet by mouth daily.   ONETOUCH ULTRA test strip USE AS DIRECTED TO MONITOR  FSBS 3 TIMES DAILY   PROAIR HFA 108 (90 Base) MCG/ACT inhaler Inhale 2 puffs into the lungs every 6 (six) hours  as needed for wheezing or shortness of breath.   rosuvastatin (CRESTOR) 20 MG tablet Take 1 tablet (20 mg total) by mouth daily.   sevelamer carbonate (RENVELA) 800 MG tablet Take 800 mg by mouth 2 (two) times daily with a meal.   oxyCODONE-acetaminophen (PERCOCET) 5-325 MG tablet Take 1 tablet by mouth every 6 (six) hours as needed for severe pain. (Patient not taking: Reported on 03/01/2020)   Facility-Administered Encounter Medications as of 03/01/2020  Medication   Bevacizumab (AVASTIN) SOLN 1.25 mg   Bevacizumab (AVASTIN) SOLN 1.25 mg   Bevacizumab (AVASTIN) SOLN 1.25 mg   Bevacizumab (AVASTIN) SOLN 1.25 mg   Bevacizumab (AVASTIN) SOLN 1.25 mg     Current Diagnosis/Assessment:   Emergency planning/management officer Strain: Low Risk    Difficulty of Paying Living Expenses: Not very hard    Goals Addressed             This Visit's Progress    Pharmacy Care Plan:       CARE PLAN ENTRY (see longitudinal plan of care for additional care plan information)  Current Barriers:  Chronic Disease Management support, education, and care coordination needs related to Hypertension, Hyperlipidemia, and Diabetes   Hypertension BP Readings from Last 3 Encounters:  02/07/20 (!) 102/52  11/08/19 131/65  10/29/19 115/67   Pharmacist Clinical Goal(s): Over the next 180 days, patient will work with PharmD and providers to maintain BP goal <130/80 Current regimen:  No medications Interventions: Discussed BP readings at dialysis appointments Counseled on limiting processed  foods in diet Patient self care activities - Over the next 180 days, patient will: Check BP as current, document, and provide at future appointments Ensure daily salt intake < 2300 mg/day  Hyperlipidemia Lab Results  Component Value Date/Time   LDLCALC 68 02/07/2020 11:59 AM   Pharmacist Clinical Goal(s): Over the next 180  days, patient will work with PharmD and providers to maintain LDL goal < 70 Current regimen:    Rosuvastatin 31m Interventions: Reviewed most recent lipid panel Discussed dietary modifications to keep cholesterol WNL Patient self care activities - Over the next 180 days, patient will: Continue to focus on medication adherence by pill count  Diabetes Lab Results  Component Value Date/Time   HGBA1C 6.9 (H) 02/07/2020 11:59 AM   HGBA1C 6.9 (H) 10/29/2019 03:30 PM   Pharmacist Clinical Goal(s): Over the next 180 days, patient will work with PharmD and providers to maintain A1c goal <7% Current regimen:  Tresiba 30 units daily if blood sugar is > 130 fasting. Interventions: Counseled on self dosing of insulin. Discussed importance of eating when taking insulin. Counseled on dietary modifications to keep euglycemia. Patient self care activities - Over the next 180 days, patient will: Check blood sugar once daily, document, and provide at future appointments Contact provider with any episodes of hypoglycemia   Initial goal documentation         Diabetes   Recent Relevant Labs: Lab Results  Component Value Date/Time   HGBA1C 6.9 (H) 02/07/2020 11:59 AM   HGBA1C 6.9 (H) 10/29/2019 03:30 PM   MICROALBUR 97.9 07/07/2017 12:13 PM   MICROALBUR 47.9 12/30/2016 11:53 AM     Checking BG: Daily  Recent FBG Readings: 110-156  Patient is currently controlled on the following medications: Tresiba 100u/mL 30 units daily  Last diabetic Foot exam:  Lab Results  Component Value Date/Time   HMDIABEYEEXA Retinopathy (A) 12/29/2018 12:00 AM    Last diabetic Eye exam: No results found for: HMDIABFOOTEX   Patient has decided to up her Tresiba dose to 40 units daily on her own.  She states that 30 units was not enough.  Counseled on the danger of self dosing her insulin.  She is also not eating enough sometimes after doses of her insulin.  Denies signs of hypoglycemia.  Encouraged her to stick to Dr. PSamella Parrrecommended dosing, however seems unlikely.  She eats lots of processed  foods which is what is leading to these high fasting numbers.  Plan  Continue current medications, follow recommended dosing guidelines, work on limiting carbohydrates and processed foods. Hypertension    Office blood pressures are  BP Readings from Last 3 Encounters:  02/07/20 (!) 102/52  11/08/19 131/65  10/29/19 115/67    Patient has failed these meds in the past: none noted  Patient checks BP at home  at dialysis   Patient home BP readings are ranging: no logs Patient is currently controlled on the following medications:  none  Patient has her BP checked regularly at her dialysis appointments.  She reports it is always "low".  Denies dizziness.  Reports diet high in processed foods and sodium.  Plan  Work on limiting salt intake, report BP > 140/90 consistently to PCP.  Hyperlipidemia   LDL goal < 70  Lipid Panel     Component Value Date/Time   CHOL 119 02/07/2020 1159   TRIG 101 02/07/2020 1159   HDL 33 (L) 02/07/2020 1159   LDLCALC 68 02/07/2020 1159    Hepatic Function Latest Ref  Rng & Units 02/07/2020 11/08/2019 11/03/2019  Total Protein 6.1 - 8.1 g/dL 6.9 7.1 6.2  Albumin 3.5 - 5.0 g/dL - 3.0(L) -  AST 10 - 35 U/L 11 15 14   ALT 6 - 29 U/L 7 9 3(L)  Alk Phosphatase 38 - 126 U/L - 59 -  Total Bilirubin 0.2 - 1.2 mg/dL 0.7 0.9 0.5     The ASCVD Risk score Mikey Bussing DC Jr., et al., 2013) failed to calculate for the following reasons:   The valid total cholesterol range is 130 to 320 mg/dL   Patient has failed these meds in past: none noted Patient is currently controlled on the following medications:  Rosuvastatin 51m  She is adherent to medication. No myalgias.  LDL WNL in July.  Patient working on diet modifications lower in processed foods.  Plan  Continue current medications.   Vaccines   Reviewed and discussed patient's vaccination history.    Immunization History  Administered Date(s) Administered   Influenza,inj,Quad PF,6+ Mos 04/07/2017,  04/10/2018   Pneumococcal Conjugate-13 07/07/2017   Pneumococcal Polysaccharide-23 12/30/2016    Plan  Recommended patient receive COVID-19, shingles vaccine in office.  Medication Management   Miscellaneous medications: Gabapentin 3055mBreo Ellipta Albuterol HFA 9057mOTC's:  Benadryl Rena-vite Patient currently uses [employer]/process development scientistPhone #  [phone] reports using vials method to organize medications and promote adherence. Patient denies missed doses of medication.   ChrBeverly MilchharmD Clinical Pharmacist [phone] have collaborated with the care management provider regarding care management and care coordination activities outlined in this encounter and have reviewed this encounter including documentation in the note and care plan. I am certifying that I agree with the content of this note and encounter as supervising physician.

## 2020-02-29 DIAGNOSIS — N2581 Secondary hyperparathyroidism of renal origin: Secondary | ICD-10-CM | POA: Diagnosis not present

## 2020-02-29 DIAGNOSIS — Z992 Dependence on renal dialysis: Secondary | ICD-10-CM | POA: Diagnosis not present

## 2020-02-29 DIAGNOSIS — N186 End stage renal disease: Secondary | ICD-10-CM | POA: Diagnosis not present

## 2020-02-29 DIAGNOSIS — D509 Iron deficiency anemia, unspecified: Secondary | ICD-10-CM | POA: Diagnosis not present

## 2020-02-29 DIAGNOSIS — D689 Coagulation defect, unspecified: Secondary | ICD-10-CM | POA: Diagnosis not present

## 2020-02-29 DIAGNOSIS — D631 Anemia in chronic kidney disease: Secondary | ICD-10-CM | POA: Diagnosis not present

## 2020-02-29 NOTE — Progress Notes (Signed)
ADRIA, COSTLEY ([id number]) Visit Report for 02/23/2020 Arrival Information Details Patient Name: Date of Service: [place] [name], [name] 02/23/2020 10:45 A M Medical Record Number: [id number] Patient Account Number: [id number] Date of Birth/Sex: Treating RN: [birth year] (69 y.o. [name] Primary Care Faythe Heitzenrater: [name] RD, [name] Other Clinician: Referring Berlinda Farve: Treating Harlo Fabela/Extender: [name] RD, [name] Weeks in Treatment: 2 Visit Information History Since Last Visit Added or deleted any medications: No Patient Arrived: Wheel Chair Any new allergies or adverse reactions: No Arrival Time: 11:33 Had a fall or experienced change in No Accompanied By: self activities of daily living that may affect Transfer Assistance: None risk of falls: Patient Identification Verified: Yes Signs or symptoms of abuse/neglect since last visito No Secondary Verification Process Completed: Yes Hospitalized since last visit: No Patient Has Alerts: Yes Implantable device outside of the clinic excluding No Patient Alerts: Right ABI: [place] cellular tissue based products placed in the center Left ABI: [place] since last visit: Has Dressing in Place as Prescribed: Yes Pain Present Now: No Electronic Signature(s) Signed: 02/29/2020 10:17:44 AM By: Sandre Kitty Entered By: Sandre Kitty on 02/23/2020 11:35:15 -------------------------------------------------------------------------------- Compression Therapy Details Patient Name: Date of Service: [name], [place] [name] 02/23/2020 10:45 A M Medical Record Number: [id number] Patient Account Number: [id number] Date of Birth/Sex: Treating RN: 10-Jul-1951 (69 y.o. [name] Primary Care Bruchy Mikel: [name] RD, [name] Other Clinician: Referring Royelle Hinchman: Treating Felesha Moncrieffe/Extender: [name] RD, [name] Weeks in Treatment: 2 Compression Therapy Performed for Wound Assessment: Wound #3 Right,Medial Malleolus Performed By:  [name], RN Compression Type: Three Layer Post Procedure Diagnosis Same as Pre-procedure Electronic Signature(s) Signed: 02/23/2020 4:54:27 PM By: [name] RN, BSN Entered By: [name] on 02/23/2020 12:34:32 -------------------------------------------------------------------------------- Lower Extremity Assessment Details Patient Name: Date of Service: [facility] [name], [place] [name] 02/23/2020 10:45 A M Medical Record Number: [id number] Patient Account Number: [id number] Date of Birth/Sex: Treating RN: 07/15/1951 (69 y.o. [name] Primary Care Niyati Heinke: [name] RD, [name] Other Clinician: Referring Caetano Oberhaus: Treating Ravon Mortellaro/Extender: [name] RD, [name] Weeks in Treatment: 2 Edema Assessment Assessed: [Left: No] [Right: No] Edema: [Left: Yes] [Right: Yes] Calf Left: Right: Point of Measurement: 38 cm From Medial Instep 34 cm 34.5 cm Ankle Left: Right: Point of Measurement: 13 cm From Medial Instep 23 cm 23.4 cm Vascular Assessment Pulses: Dorsalis Pedis Palpable: [Left:Yes] [Right:Yes] Electronic Signature(s) Signed: 02/24/2020 5:21:32 PM By: Levan Hurst RN, BSN Entered By: Levan Hurst on 02/23/2020 12:09:23 -------------------------------------------------------------------------------- Pioneer Details Patient Name: Date of Service: [facility] [name], [place] [name] 02/23/2020 10:45 A M Medical Record Number: [id number] Patient Account Number: [id number] Date of Birth/Sex: Treating RN: 06/26/1951 (69 y.o. [name] Primary Care Jakki Doughty: [name] RD, [name] Other Clinician: Referring Annie Saephan: Treating Chera Slivka/Extender: [name] RD, [name] Weeks in Treatment: 2 Active Inactive Abuse / Safety / Falls / Self Care Management Nursing Diagnoses: Potential for falls Goals: Patient/caregiver will verbalize/demonstrate measures taken to prevent injury and/or falls Date Initiated: 02/09/2020 Target  Resolution Date: 03/08/2020 Goal Status: Active Interventions: Assess fall risk on admission and as needed Notes: Venous Leg Ulcer Nursing Diagnoses: Knowledge deficit related to disease process and management Potential for venous Insuffiency (use before diagnosis confirmed) Goals: Patient will maintain optimal edema control Date Initiated: 02/09/2020 Target Resolution Date: 03/08/2020 Goal Status: Active Patient/caregiver will verbalize understanding of disease process and disease management Date Initiated: 02/09/2020 Target Resolution Date: 03/08/2020  Goal Status: Active Interventions: Assess peripheral edema status every visit. Compression as ordered Provide education on venous insufficiency Treatment Activities: Therapeutic compression applied : 02/09/2020 Notes: Wound/Skin Impairment Nursing Diagnoses: Impaired tissue integrity Knowledge deficit related to ulceration/compromised skin integrity Goals: Patient/caregiver will verbalize understanding of skin care regimen Date Initiated: 02/09/2020 Target Resolution Date: 03/08/2020 Goal Status: Active Ulcer/skin breakdown will have a volume reduction of 30% by week 4 Date Initiated: 02/09/2020 Target Resolution Date: 03/08/2020 Goal Status: Active Interventions: Assess patient/caregiver ability to obtain necessary supplies Assess patient/caregiver ability to perform ulcer/skin care regimen upon admission and as needed Assess ulceration(s) every visit Provide education on ulcer and skin care Treatment Activities: Skin care regimen initiated : 02/09/2020 Topical wound management initiated : 02/09/2020 Notes: Electronic Signature(s) Signed: 02/23/2020 4:54:27 PM By: [name] RN, BSN Entered By: [name] on 02/23/2020 12:33:50 -------------------------------------------------------------------------------- Pain Assessment Details Patient Name: Date of Service: [name], [place] [name] 02/23/2020 10:45 A M Medical Record  Number: [id number] Patient Account Number: [id number] Date of Birth/Sex: Treating RN: 10-27-1950 (69 y.o. [name] Primary Care Chaske Paskett: [place], [name] Other Clinician: Referring Tyren Dugar: Treating Fontaine Kossman/Extender: [name] RD, [name] Weeks in Treatment: 2 Active Problems Location of Pain Severity and Description of Pain Patient Has Paino No Site Locations Pain Management and Medication Current Pain Management: Electronic Signature(s) Signed: 02/23/2020 4:54:27 PM By: [name] RN, BSN Signed: 02/29/2020 10:17:44 AM By: Sandre Kitty Entered By: Sandre Kitty on 02/23/2020 11:35:39 -------------------------------------------------------------------------------- Patient/Caregiver Education Details Patient Name: Date of Service: [facility] [name], [name] 8/4/2021andnbsp10:45 A M Medical Record Number: [id number] Patient Account Number: [id number] Date of Birth/Gender: Treating RN: 1950-12-19 (69 y.o. [name] Primary Care Physician: [place], [name] Other Clinician: Referring Physician: Treating Physician/Extender: [name] RD, [name] Weeks in Treatment: 2 Education Assessment Education Provided To: Patient Education Topics Provided Venous: Methods: Explain/Verbal Responses: Reinforcements needed, State content correctly Wound/Skin Impairment: Methods: Explain/Verbal Responses: Reinforcements needed, State content correctly Electronic Signature(s) Signed: 02/23/2020 4:54:27 PM By: [name] RN, BSN Entered By: [name] on 02/23/2020 12:34:12 -------------------------------------------------------------------------------- Wound Assessment Details Patient Name: Date of Service: [name], [place] [name] 02/23/2020 10:45 A M Medical Record Number: [id number] Patient Account Number: [id number] Date of Birth/Sex: Treating RN: 18-Mar-1951 (69 y.o. [name] Primary Care Patience Nuzzo: [name] RD, [name] Other  Clinician: Referring Crewe Heathman: Treating Chance Karam/Extender: [name] RD, [name] Weeks in Treatment: 2 Wound Status Wound Number: 2 Primary Diabetic Wound/Ulcer of the Lower Extremity Etiology: Wound Location: Left, Distal, Anterior Lower Leg Wound Healed - Epithelialized Wounding Event: Gradually Appeared Status: Date Acquired: 01/20/2020 Comorbid Cataracts, Anemia, Asthma, Hypertension, Type II Diabetes, End Weeks Of Treatment: 2 History: Stage Renal Disease, Osteomyelitis, Neuropathy Clustered Wound: No Photos Photo Uploaded By: Mikeal Hawthorne on 02/24/2020 13:37:21 Wound Measurements Length: (cm) Width: (cm) Depth: (cm) Area: (cm) Volume: (cm) 0 % Reduction in Area: 100% 0 % Reduction in Volume: 100% 0 Epithelialization: Large (67-100%) 0 Tunneling: No 0 Undermining: No Wound Description Classification: Grade 2 Wound Margin: Distinct, outline attached Exudate Amount: None Present Foul Odor After Cleansing: No Slough/Fibrino No Wound Bed Granulation Amount: None Present (0%) Exposed Structure Necrotic Amount: None Present (0%) Fascia Exposed: No Fat Layer (Subcutaneous Tissue) Exposed: No Tendon Exposed: No Muscle Exposed: No Joint Exposed: No Bone Exposed: No Electronic Signature(s) Signed: 02/23/2020 4:54:27 PM By: [name] RN, BSN Signed: 02/24/2020 5:21:32 PM By: Levan Hurst RN, BSN Entered By: Donnal Debar,  [place] on 02/23/2020 12:09:57 -------------------------------------------------------------------------------- Wound Assessment Details Patient Name: Date of Service: [name], [name] 02/23/2020 10:45 A M Medical Record Number: [id number] Patient Account Number: [id number] Date of Birth/Sex: Treating RN: Dec 16, 1950 (69 y.o. [name] Primary Care Jahmez Bily: [name] RD, [name] Other Clinician: Referring Raylea Adcox: Treating Terrianne Cavness/Extender: [name] RD, [name] Weeks in Treatment: 2 Wound Status Wound Number: 3  Primary Diabetic Wound/Ulcer of the Lower Extremity Etiology: Wound Location: Right, Medial Malleolus Wound Open Wounding Event: Gradually Appeared Status: Date Acquired: 10/21/2019 Comorbid Cataracts, Anemia, Asthma, Hypertension, Type II Diabetes, End Weeks Of Treatment: 2 History: Stage Renal Disease, Osteomyelitis, Neuropathy Clustered Wound: Yes Photos Photo Uploaded By: Mikeal Hawthorne on 02/24/2020 13:37:35 Wound Measurements Length: (cm) 0.4 Width: (cm) 0.4 Depth: (cm) 0.2 Clustered Quantity: 1 Area: (cm) 0.126 Volume: (cm) 0.025 % Reduction in Area: 98% % Reduction in Volume: 96.1% Epithelialization: Medium (34-66%) Tunneling: No Undermining: No Wound Description Classification: Grade 2 Wound Margin: Distinct, outline attached Exudate Amount: Medium Exudate Type: Serous Exudate Color: amber Foul Odor After Cleansing: No Slough/Fibrino Yes Wound Bed Granulation Amount: Small (1-33%) Exposed Structure Granulation Quality: Pink, Pale Fascia Exposed: No Necrotic Amount: Large (67-100%) Fat Layer (Subcutaneous Tissue) Exposed: Yes Necrotic Quality: Adherent Slough Tendon Exposed: No Muscle Exposed: No Joint Exposed: No Bone Exposed: No Electronic Signature(s) Signed: 02/23/2020 4:54:27 PM By: [name] RN, BSN Signed: 02/24/2020 5:21:32 PM By: Levan Hurst RN, BSN Entered By: Levan Hurst on 02/23/2020 12:11:09 -------------------------------------------------------------------------------- Wound Assessment Details Patient Name: Date of Service: [name], [name] 02/23/2020 10:45 A M Medical Record Number: [id number] Patient Account Number: [id number] Date of Birth/Sex: Treating RN: 1950-08-09 (69 y.o. [name] Primary Care Breanda Greenlaw: [name] RD, [name] Other Clinician: Referring Shanikqua Zarzycki: Treating Gordon Vandunk/Extender: [name] RD, [name] Weeks in Treatment: 2 Wound Status Wound Number: 4 Primary Diabetic Wound/Ulcer of the  Lower Extremity Etiology: Wound Location: Right, Medial Lower Leg Wound Healed - Epithelialized Wounding Event: Gradually Appeared Status: Status: Date Acquired: 10/21/2019 Comorbid Cataracts, Anemia, Asthma, Hypertension, Type II Diabetes, End Weeks Of Treatment: 2 History: Stage Renal Disease, Osteomyelitis, Neuropathy Clustered Wound: Yes Photos Photo Uploaded By: Mikeal Hawthorne on 02/24/2020 13:36:55 Wound Measurements Length: (cm) Width: (cm) Depth: (cm) Clustered Quantity: Area: (cm) Volume: (cm) 0 % Reduction in Area: 100% 0 % Reduction in Volume: 100% 0 Epithelialization: Large (67-100%) 3 Tunneling: No 0 Undermining: No 0 Wound Description Classification: Grade 2 Wound Margin: Distinct, outline attached Exudate Amount: None Present Foul Odor After Cleansing: No Slough/Fibrino No Wound Bed Granulation Amount: None Present (0%) Exposed Structure Necrotic Amount: None Present (0%) Fascia Exposed: No Fat Layer (Subcutaneous Tissue) Exposed: No Tendon Exposed: No Muscle Exposed: No Joint Exposed: No Bone Exposed: No Electronic Signature(s) Signed: 02/23/2020 4:54:27 PM By: [name] RN, BSN Signed: 02/24/2020 5:21:32 PM By: Levan Hurst RN, BSN Entered By: Levan Hurst on 02/23/2020 12:11:35 -------------------------------------------------------------------------------- Wound Assessment Details Patient Name: Date of Service: [name], [name] 02/23/2020 10:45 A M Medical Record Number: [id number] Patient Account Number: [id number] Date of Birth/Sex: Treating RN: [birth year] (69 y.o. [name] Primary Care Penelope Fittro: [name] RD, [name] Other Clinician: Referring Ann Groeneveld: Treating Tavi Hoogendoorn/Extender: [name] RD, [name] Weeks in Treatment: 2 Wound Status Wound Number: 7 Primary Diabetic Wound/Ulcer of the Lower Extremity Etiology: Wound Location: Right T Third oe Wound Open Wounding Event: Gradually Appeared Status: Date  Acquired: 02/14/2020 Comorbid Cataracts, Anemia, Asthma, Hypertension, Type II Diabetes,  End Weeks Of Treatment: 1 History: Stage Renal Disease, Osteomyelitis, Neuropathy Clustered Wound: No Photos Photo Uploaded By: Mikeal Hawthorne on 02/24/2020 13:36:55 Wound Measurements Length: (cm) Width: (cm) Depth: (cm) Area: (cm) Volume: (cm) 0 % Reduction in Area: 100% 0 % Reduction in Volume: 100% 0 Epithelialization: Large (67-100%) 0 Tunneling: No 0 Undermining: No Wound Description Classification: Grade 2 Wound Margin: Flat and Intact Exudate Amount: None Present Foul Odor After Cleansing: No Slough/Fibrino No Wound Bed Granulation Amount: None Present (0%) Exposed Structure Necrotic Amount: None Present (0%) Fascia Exposed: No Fat Layer (Subcutaneous Tissue) Exposed: No Tendon Exposed: No Muscle Exposed: No Joint Exposed: No Bone Exposed: No Electronic Signature(s) Signed: 02/23/2020 4:54:27 PM By: [name] RN, BSN Signed: 02/24/2020 5:21:32 PM By: Levan Hurst RN, BSN Entered By: Levan Hurst on 02/23/2020 12:12:25 -------------------------------------------------------------------------------- Wound Assessment Details Patient Name: Date of Service: [name], [place] [name] 02/23/2020 10:45 A M Medical Record Number: [id number] Patient Account Number: [id number] Date of Birth/Sex: Treating RN: Aug 26, 1950 (69 y.o. [name] Primary Care Neddie Steedman: [name] RD, [name] Other Clinician: Referring Hudsyn Barich: Treating Miquela Costabile/Extender: [name] RD, [name] Weeks in Treatment: 2 Wound Status Wound Number: 8 Primary Diabetic Wound/Ulcer of the Lower Extremity Etiology: Wound Location: Left T Third oe Wound Open Wounding Event: Blister Status: Date Acquired: 02/23/2020 Comorbid Cataracts, Anemia, Asthma, Hypertension, Type II Diabetes, End Weeks Of Treatment: 0 History: Stage Renal Disease, Osteomyelitis, Neuropathy Clustered Wound: No Photos Photo  Uploaded By: Mikeal Hawthorne on 02/24/2020 13:37:46 Wound Measurements Length: (cm) 0.3 Width: (cm) 0.6 Depth: (cm) 0.1 Area: (cm) 0.141 Volume: (cm) 0.014 % Reduction in Area: 0% % Reduction in Volume: 0% Epithelialization: None Tunneling: No Undermining: No Wound Description Classification: Grade 2 Wound Margin: Flat and Intact Exudate Amount: Medium Exudate Type: Serous Exudate Color: amber Foul Odor After Cleansing: No Slough/Fibrino Yes Wound Bed Granulation Amount: None Present (0%) Exposed Structure Necrotic Amount: Large (67-100%) Fascia Exposed: No Necrotic Quality: Adherent Slough Fat Layer (Subcutaneous Tissue) Exposed: No Tendon Exposed: No Muscle Exposed: No Joint Exposed: No Bone Exposed: No Electronic Signature(s) Signed: 02/23/2020 4:54:27 PM By: [name] RN, BSN Signed: 02/24/2020 5:21:32 PM By: Levan Hurst RN, BSN Entered By: Levan Hurst on 02/23/2020 12:13:10 -------------------------------------------------------------------------------- Vitals Details Patient Name: Date of Service: [name], [name] 02/23/2020 10:45 A M Medical Record Number: [id number] Patient Account Number: [id number] Date of Birth/Sex: Treating RN: [birth year] (69 y.o. [name] Primary Care Alexina Niccoli: [place], [name] Other Clinician: Referring Nyjai Graff: Treating Nikoli Nasser/Extender: [name] RD, [name] Weeks in Treatment: 2 Vital Signs Time Taken: 11:35 Temperature (F): 98.3 Height (in): 67 Pulse (bpm): 87 Weight (lbs): 202 Respiratory Rate (breaths/min): 18 Body Mass Index (BMI): 31.6 Blood Pressure (mmHg): 112/71 Reference Range: 80 - 120 mg / dl Electronic Signature(s) Signed: 02/29/2020 10:17:44 AM By: Sandre Kitty Entered By: Sandre Kitty on 02/23/2020 11:35:34

## 2020-03-01 ENCOUNTER — Ambulatory Visit: Payer: Medicare Other | Admitting: Pharmacist

## 2020-03-01 ENCOUNTER — Other Ambulatory Visit: Payer: Self-pay

## 2020-03-01 DIAGNOSIS — E1140 Type 2 diabetes mellitus with diabetic neuropathy, unspecified: Secondary | ICD-10-CM

## 2020-03-01 DIAGNOSIS — E7800 Pure hypercholesterolemia, unspecified: Secondary | ICD-10-CM

## 2020-03-01 DIAGNOSIS — I10 Essential (primary) hypertension: Secondary | ICD-10-CM

## 2020-03-01 NOTE — Patient Instructions (Addendum)
Visit Information Thank you for meeting with me today!  I look forward to working with you to help you meet all of your healthcare goals and answer any questions you may have.  Feel free to contact me anytime!  Goals Addressed            This Visit's Progress   . Pharmacy Care Plan:       CARE PLAN ENTRY (see longitudinal plan of care for additional care plan information)  Current Barriers:  . Chronic Disease Management support, education, and care coordination needs related to Hypertension, Hyperlipidemia, and Diabetes   Hypertension BP Readings from Last 3 Encounters:  02/07/20 (!) 102/52  11/08/19 131/65  10/29/19 115/67   . Pharmacist Clinical Goal(s): o Over the next 180 days, patient will work with PharmD and providers to maintain BP goal <130/80 . Current regimen:  o No medications . Interventions: o Discussed BP readings at dialysis appointments o Counseled on limiting processed foods in diet . Patient self care activities - Over the next 180 days, patient will: o Check BP as current, document, and provide at future appointments o Ensure daily salt intake < 2300 mg/day  Hyperlipidemia Lab Results  Component Value Date/Time   LDLCALC 68 02/07/2020 11:59 AM   . Pharmacist Clinical Goal(s): o Over the next 180  days, patient will work with PharmD and providers to maintain LDL goal < 70 . Current regimen:  o Rosuvastatin 20mg  . Interventions: o Reviewed most recent lipid panel o Discussed dietary modifications to keep cholesterol WNL . Patient self care activities - Over the next 180 days, patient will: o Continue to focus on medication adherence by pill count  Diabetes Lab Results  Component Value Date/Time   HGBA1C 6.9 (H) 02/07/2020 11:59 AM   HGBA1C 6.9 (H) 10/29/2019 03:30 PM   . Pharmacist Clinical Goal(s): o Over the next 180 days, patient will work with PharmD and providers to maintain A1c goal <7% . Current regimen:  o Tresiba 30 units daily if  blood sugar is > 130 fasting. . Interventions: o Counseled on self dosing of insulin. o Discussed importance of eating when taking insulin. o Counseled on dietary modifications to keep euglycemia. . Patient self care activities - Over the next 180 days, patient will: o Check blood sugar once daily, document, and provide at future appointments o Contact provider with any episodes of hypoglycemia   Initial goal documentation        [name] was given information about Chronic Care Management services today including:  1. CCM service includes personalized support from designated clinical staff supervised by her physician, including individualized plan of care and coordination with other care providers 2. 24/7 contact phone numbers for assistance for urgent and routine care needs. 3. Standard insurance, coinsurance, copays and deductibles apply for chronic care management only during months in which we provide at least 20 minutes of these services. Most insurances cover these services at 100%, however patients may be responsible for any copay, coinsurance and/or deductible if applicable. This service may help you avoid the need for more expensive face-to-face services. 4. Only one practitioner may furnish and bill the service in a calendar month. 5. The patient may stop CCM services at any time (effective at the end of the month) by phone call to the office staff.  Patient agreed to services and verbal consent obtained.   The patient verbalized understanding of instructions provided today and agreed to receive a mailed copy of patient instruction  and/or Scientist, clinical (histocompatibility and immunogenetics). Telephone follow up appointment with pharmacy team member scheduled for: 6 months  Beverly Milch, PharmD Clinical Pharmacist [facility] [phone]   Tips for Eating Away From Home If You Have Diabetes Controlling your blood sugar (glucose) levels can be challenging when you do not prepare  your own meals. The following tips can help you manage your diabetes when you eat away from home. If you have questions or if you need help, work with your health care provider or diet and nutrition specialist (dietitian). Planning ahead Plan ahead if you know you will be eating away from home:  Try to eat your meals and snacks at about the same time each day. If you know your meal is going to be later than normal, make sure you have a small snack. Being very hungry can cause you to make unhealthy food choices.  Make a list of restaurants near you that offer healthy choices. If a restaurant has a carry-out menu, take the menu home and plan what you will order ahead of time.  Look up the restaurant you want to eat at online. Many chain and fast-food restaurants list nutritional information online. Use this information to choose the healthiest options and to calculate how many carbohydrates will be in your meal.  Use a carbohydrate-counting book or mobile app to look up the carbohydrate content and serving size of the foods you want to eat. Free foods A "free food" is any food or drink that has less than 5 grams of carbohydrates and less than 20 calories per serving. These food are high in fiber and nutrients and low in calories, carbohydrates, and fats. Free foods include:  Non-starchy vegetables, such as carrots, broccoli, celery, lettuce, or green beans.  Non-sugar drinks, such as water, unsweetened coffee, or unsweetened tea.  Low-calorie salad dressings.  Sugar-free gelatin. Starting meals with a salad full of vegetables is a healthy choice that includes a lot of free foods. Avoid high-calorie salad toppings like bacon, cheese, and high-fat dressings. Ask for your salad dressing to be served on the side so that you dip your fork in the dressing and then in the salad. This allows you to control how much dressing you eat and still get the flavor with every bite. Choices to control  carbohydrates   Ask your server to take away the bread basket or chips from your table.  Choose light yogurt or [place] yogurt instead of non-fat sweetened yogurt.  Order fresh fruit. A salad bar often offers fresh fruit choices. Avoid canned fruit because it is usually packed in sugar or syrup.  Order a salad, and ask for dressing on the side.  Ask for substitutes. For example, if your meal comes with french fries, ask for a side salad or steamed veggies instead. If a meal comes with fried chicken, ask for grilled chicken instead. Beverages  Choose drinks that are low in calories and sugar, such as: ? Water. ? Unsweetened tea or coffee. ? Lowfat milk.  Avoid the following drinks: ? Alcoholic beverages. ? Regular (not diet) sodas. Other tips  If you take insulin, wait to take your insulin once your food arrives to your table. This will ensure that your insulin and your food are timed correctly.  Become familiar with serving sizes and learn to recognize how many servings are in a portion. Restaurant portions are typically two to three times larger than what you really need.  Ask your server for a to-go box  at the beginning of the meal. When your food comes, leave the amount you should have on your plate, and put the rest in the to-go box so that you are not tempted to eat too much.  Consider splitting an entree with someone and ordering a side salad.  Avoid buffets. They are typically too tempting and result in overeating. Where to find more information  American Diabetes Association: www.diabetes.org  American Association of Diabetes Educators: www.diabeteseducator.org Summary  Plan ahead when eating away from home.  Try to eat your meals and snacks at about the same time each day. If you know your meal is going to be later than normal, make sure you have a small snack. Being very hungry can cause you to make unhealthy food choices.  Ask for substitutes. For example, if your  meal comes with french fries, ask for a side salad or steamed veggies instead. If a meal comes with fried chicken, ask for grilled chicken instead.  Ask for a to-go box when you order your meal. Divide your meal before you start eating. This information is not intended to replace advice given to you by your health care provider. Make sure you discuss any questions you have with your health care provider. Document Revised: 10/16/2016 Document Reviewed: 10/16/2016 Elsevier Patient Education  [place].

## 2020-03-02 ENCOUNTER — Encounter (HOSPITAL_BASED_OUTPATIENT_CLINIC_OR_DEPARTMENT_OTHER): Payer: Medicare Other | Admitting: Internal Medicine

## 2020-03-02 DIAGNOSIS — I120 Hypertensive chronic kidney disease with stage 5 chronic kidney disease or end stage renal disease: Secondary | ICD-10-CM | POA: Diagnosis not present

## 2020-03-02 DIAGNOSIS — N186 End stage renal disease: Secondary | ICD-10-CM | POA: Diagnosis not present

## 2020-03-02 DIAGNOSIS — E11622 Type 2 diabetes mellitus with other skin ulcer: Secondary | ICD-10-CM | POA: Diagnosis not present

## 2020-03-02 DIAGNOSIS — D631 Anemia in chronic kidney disease: Secondary | ICD-10-CM | POA: Diagnosis not present

## 2020-03-02 DIAGNOSIS — L97312 Non-pressure chronic ulcer of right ankle with fat layer exposed: Secondary | ICD-10-CM | POA: Diagnosis not present

## 2020-03-02 DIAGNOSIS — L97812 Non-pressure chronic ulcer of other part of right lower leg with fat layer exposed: Secondary | ICD-10-CM | POA: Diagnosis not present

## 2020-03-02 DIAGNOSIS — L97512 Non-pressure chronic ulcer of other part of right foot with fat layer exposed: Secondary | ICD-10-CM | POA: Diagnosis not present

## 2020-03-02 DIAGNOSIS — Z992 Dependence on renal dialysis: Secondary | ICD-10-CM | POA: Diagnosis not present

## 2020-03-02 DIAGNOSIS — E11621 Type 2 diabetes mellitus with foot ulcer: Secondary | ICD-10-CM | POA: Diagnosis not present

## 2020-03-02 DIAGNOSIS — N2581 Secondary hyperparathyroidism of renal origin: Secondary | ICD-10-CM | POA: Diagnosis not present

## 2020-03-02 DIAGNOSIS — D689 Coagulation defect, unspecified: Secondary | ICD-10-CM | POA: Diagnosis not present

## 2020-03-02 DIAGNOSIS — I872 Venous insufficiency (chronic) (peripheral): Secondary | ICD-10-CM | POA: Diagnosis not present

## 2020-03-02 DIAGNOSIS — L97822 Non-pressure chronic ulcer of other part of left lower leg with fat layer exposed: Secondary | ICD-10-CM | POA: Diagnosis not present

## 2020-03-02 DIAGNOSIS — E1122 Type 2 diabetes mellitus with diabetic chronic kidney disease: Secondary | ICD-10-CM | POA: Diagnosis not present

## 2020-03-02 DIAGNOSIS — D509 Iron deficiency anemia, unspecified: Secondary | ICD-10-CM | POA: Diagnosis not present

## 2020-03-03 NOTE — Progress Notes (Signed)
NEVADA, KIRCHNER ([id number]) Visit Report for 03/02/2020 Arrival Information Details Patient Name: Date of Service: [name] [name], [name] 03/02/2020 8:30 A M Medical Record Number: [id number] Patient Account Number: [id number] Date of Birth/Sex: Treating RN: [birth year] (69 y.o. Female) [name] Primary Care [name]: [name] RD, [name] Other Clinician: Referring Siriah Treat: Treating [name]/Extender: [name] RD, [name] Weeks in Treatment: 3 Visit Information History Since Last Visit Added or deleted any medications: No Patient Arrived: Wheel Chair Any new allergies or adverse reactions: No Arrival Time: 08:51 Had a fall or experienced change in No Accompanied By: self activities of daily living that may affect Transfer Assistance: None risk of falls: Patient Identification Verified: Yes Signs or symptoms of abuse/neglect since last visito No Secondary Verification Process Completed: Yes Hospitalized since last visit: No Patient Has Alerts: Yes Implantable device outside of the clinic excluding No Patient Alerts: Right ABI: [place] cellular tissue based products placed in the center Left ABI: [place] since last visit: Has Dressing in Place as Prescribed: Yes Pain Present Now: No Electronic Signature(s) Signed: 03/03/2020 1:13:21 PM By: [name] Entered By: [name] on 03/02/2020 08:51:25 -------------------------------------------------------------------------------- Compression Therapy Details Patient Name: Date of Service: [name], [name] [name] 03/02/2020 8:30 A M Medical Record Number: [id number] Patient Account Number: [id number] Date of Birth/Sex: Treating RN: [birth year] (69 y.o. Female) [name] Primary Care Richele Strand: [name] RD, [place] Other Clinician: Referring [name]: Treating [name]/Extender: [name] RD, [place] [name] Weeks in Treatment: 3 Compression Therapy Performed for Wound Assessment: Wound #10 Left,Medial Lower Leg Performed  By: Clinician [name] Hurst, RN Compression Type: Three Layer Post Procedure Diagnosis Same as Pre-procedure Electronic Signature(s) Signed: 03/03/2020 5:19:54 PM By: [name] Hurst RN, BSN Entered By: [name] on 03/02/2020 09:22:38 -------------------------------------------------------------------------------- Compression Therapy Details Patient Name: Date of Service: [name], [place] [name] 03/02/2020 8:30 A M Medical Record Number: [id number] Patient Account Number: [id number] Date of Birth/Sex: Treating RN: [birth year] (69 y.o. Female) [name] Primary Care Alphonzo Devera: [name] RD, [place] Other Clinician: Referring Jashon Ishida: Treating [name]/Extender: [name] RD, [name] Weeks in Treatment: 3 Compression Therapy Performed for Wound Assessment: Wound #9 Left,Anterior Lower Leg Performed By: Clinician [name] Hurst, RN Compression Type: Three Layer Post Procedure Diagnosis Same as Pre-procedure Electronic Signature(s) Signed: 03/03/2020 5:19:54 PM By: [name] Hurst RN, BSN Entered By: [name] on 03/02/2020 09:22:38 -------------------------------------------------------------------------------- Encounter Discharge Information Details Patient Name: Date of Service: [name] [name], [name] [name] 03/02/2020 8:30 A M Medical Record Number: [id number] Patient Account Number: [id number] Date of Birth/Sex: Treating RN: [birth year] (69 y.o. Female) [name] Primary Care Rasa Degrazia: [name], [name] Other Clinician: Referring [name]: Treating [name]/Extender: [name] RD, [name] Weeks in Treatment: 3 Encounter Discharge Information Items Discharge Condition: Stable Ambulatory Status: Wheelchair Discharge Destination: Home Transportation: Private Auto Accompanied By: self Schedule Follow-up Appointment: Yes Clinical Summary of Care: Patient Declined Electronic Signature(s) Signed: 03/03/2020 5:26:58 PM By: [name] Gouty RN, BSN Entered  By: [name] on 03/02/2020 09:41:31 -------------------------------------------------------------------------------- Lower Extremity Assessment Details Patient Name: Date of Service: [name] [name], [name] [name] 03/02/2020 8:30 A M Medical Record Number: [id number] Patient Account Number: [id number] Date of Birth/Sex: Treating RN: [birth year] (69 y.o. Female) [name] Primary Care Darnell Stimson: [name] RD, [name] Other Clinician: Referring [name]: Treating [name]/Extender: [name] RD, [name] Weeks in Treatment: 3 Edema Assessment Assessed: [Left: No] [Right: No] Edema: [Left: Yes] [Right: Yes] Calf Left: Right: Point of Measurement: 38 cm From  Medial Instep 39.5 cm 35 cm Ankle Left: Right: Point of Measurement: 13 cm From Medial Instep 25 cm 24 cm Vascular Assessment Pulses: Dorsalis Pedis Palpable: [Left:No] [Right:No] Electronic Signature(s) Signed: 03/02/2020 5:05:13 PM By: [name] Entered By: [name] on 03/02/2020 08:54:39 -------------------------------------------------------------------------------- Multi-Disciplinary Care Plan Details Patient Name: Date of Service: [name] [name], [name] [name] 03/02/2020 8:30 A M Medical Record Number: [id number] Patient Account Number: [id number] Date of Birth/Sex: Treating RN: 21-Oct-1950 (69 y.o. Female) [name] Primary Care Rakeb Kibble: [name] RD, [place] Other Clinician: Referring Deshanta Lady: Treating Augustus Zurawski/Extender: [name] RD, [place] [name] Weeks in Treatment: 3 Active Inactive Wound/Skin Impairment Nursing Diagnoses: Impaired tissue integrity Knowledge deficit related to ulceration/compromised skin integrity Goals: Patient/caregiver will verbalize understanding of skin care regimen Date Initiated: 02/09/2020 Target Resolution Date: 03/08/2020 Goal Status: Active Ulcer/skin breakdown will have a volume reduction of 30% by week 4 Date Initiated: 02/09/2020 Date Inactivated:  03/02/2020 Target Resolution Date: 03/08/2020 Goal Status: Met Interventions: Assess patient/caregiver ability to obtain necessary supplies Assess patient/caregiver ability to perform ulcer/skin care regimen upon admission and as needed Assess ulceration(s) every visit Provide education on ulcer and skin care Treatment Activities: Skin care regimen initiated : 02/09/2020 Topical wound management initiated : 02/09/2020 Notes: Electronic Signature(s) Signed: 03/03/2020 5:19:54 PM By: [name] Hurst RN, BSN Entered By: [name] on 03/02/2020 09:54:08 -------------------------------------------------------------------------------- Pain Assessment Details Patient Name: Date of Service: [name], [place] [name] 03/02/2020 8:30 A M Medical Record Number: [id number] Patient Account Number: [id number] Date of Birth/Sex: Treating RN: 06-Feb-1951 (69 y.o. Female) [name] Primary Care [place] Luppino: [place], [place] Other Clinician: Referring Abisola Carrero: Treating Aniket Paye/Extender: [name] RD, [place] [name] Weeks in Treatment: 3 Active Problems Location of Pain Severity and Description of Pain Patient Has Paino No Site Locations Pain Management and Medication Current Pain Management: Electronic Signature(s) Signed: 03/03/2020 1:13:21 PM By: [name] Signed: 03/03/2020 5:19:54 PM By: [name] Hurst RN, BSN Entered By: [name] on 03/02/2020 08:51:57 -------------------------------------------------------------------------------- Patient/Caregiver Education Details Patient Name: Date of Service: [facility] [name], [place] [name] 8/12/2021andnbsp8:30 A M Medical Record Number: [id number] Patient Account Number: [id number] Date of Birth/Gender: Treating RN: May 08, 1951 (69 y.o. Female) [name] Primary Care Physician: [place], [place] Other Clinician: Referring Physician: Treating Physician/Extender: [name] RD, [place] Weeks in Treatment: 3 Education  Assessment Education Provided To: Patient Education Topics Provided Wound/Skin Impairment: Methods: Explain/Verbal Responses: State content correctly Electronic Signature(s) Signed: 03/03/2020 5:19:54 PM By: [name] Hurst RN, BSN Entered By: [name] on 03/02/2020 09:54:17 -------------------------------------------------------------------------------- Wound Assessment Details Patient Name: Date of Service: [name], [place] [name] 03/02/2020 8:30 A M Medical Record Number: [id number] Patient Account Number: [id number] Date of Birth/Sex: Treating RN: Jul 16, 1951 (69 y.o. Female) [name] Primary Care Edythe Riches: [name] RD, [name] Other Clinician: Referring Eldora Napp: Treating Stiles Maxcy/Extender: [name] RD, [name] Weeks in Treatment: 3 Wound Status Wound Number: 10 Primary Diabetic Wound/Ulcer of the Lower Extremity Etiology: Wound Location: Left, Medial Lower Leg Wound Open Wounding Event: Gradually Appeared Status: Date Acquired: 03/02/2020 Comorbid Cataracts, Anemia, Asthma, Hypertension, Type II Diabetes, End Weeks Of Treatment: 0 History: Stage Renal Disease, Osteomyelitis, Neuropathy Clustered Wound: No Wound Measurements Length: (cm) 0.3 Width: (cm) 0.6 Depth: (cm) 0.1 Area: (cm) 0.141 Volume: (cm) 0.014 % Reduction in Area: % Reduction in Volume: Epithelialization: None Tunneling: No Undermining: No Wound Description Classification: Grade 2 Wound Margin: Distinct, outline attached Exudate Amount: Small Exudate Type: Serosanguineous Exudate Color: red, brown Foul Odor After  Cleansing: No Slough/Fibrino No Wound Bed Granulation Amount: Large (67-100%) Exposed Structure Granulation Quality: Red, Pink Fascia Exposed: No Necrotic Amount: None Present (0%) Fat Layer (Subcutaneous Tissue) Exposed: Yes Tendon Exposed: No Muscle Exposed: No Joint Exposed: No Bone Exposed: No Treatment Notes Wound #10 (Left, Medial Lower Leg) 2.  Periwound Care Moisturizing lotion 3. Primary Dressing Applied Calcium Alginate Ag 4. Secondary Dressing Dry Gauze 6. Support Layer Applied 3 layer compression wrap Electronic Signature(s) Signed: 03/02/2020 5:05:13 PM By: [name] Entered By: [name] on 03/02/2020 09:00:07 -------------------------------------------------------------------------------- Wound Assessment Details Patient Name: Date of Service: [facility] [name], [place] [name] 03/02/2020 8:30 A M Medical Record Number: [id number] Patient Account Number: [id number] Date of Birth/Sex: Treating RN: 08-Mar-1951 (69 y.o. Female) [name] Primary Care Sherald Balbuena: [name] RD, [name] Other Clinician: Referring Kasandra Fehr: Treating Juley Giovanetti/Extender: [name] RD, [place] [name] Weeks in Treatment: 3 Wound Status Wound Number: 3 Primary Diabetic Wound/Ulcer of the Lower Extremity Etiology: Wound Location: Right, Medial Malleolus Wound Healed - Epithelialized Wounding Event: Gradually Appeared Status: Date Acquired: 10/21/2019 Comorbid Cataracts, Anemia, Asthma, Hypertension, Type II Diabetes, End Weeks Of Treatment: 3 History: Stage Renal Disease, Osteomyelitis, Neuropathy Clustered Wound: Yes Photos Photo Uploaded By: Mikeal Hawthorne on 03/03/2020 11:07:40 Wound Measurements Length: (cm) Width: (cm) Depth: (cm) Area: (cm) Volume: (cm) 0 % Reduction in Area: 100% 0 % Reduction in Volume: 100% 0 Epithelialization: Large (67-100%) 0 Tunneling: No 0 Undermining: No Wound Description Classification: Grade 2 Wound Margin: Distinct, outline attached Exudate Amount: None Present Foul Odor After Cleansing: No Slough/Fibrino No Wound Bed Granulation Amount: None Present (0%) Exposed Structure Necrotic Amount: None Present (0%) Fascia Exposed: No Fat Layer (Subcutaneous Tissue) Exposed: No Tendon Exposed: No Muscle Exposed: No Joint Exposed: No Bone Exposed: No Electronic Signature(s) Signed:  03/02/2020 5:05:13 PM By: [name] Entered By: [name] on 03/02/2020 08:56:24 -------------------------------------------------------------------------------- Wound Assessment Details Patient Name: Date of Service: [name], [place] [name] 03/02/2020 8:30 A M Medical Record Number: [id number] Patient Account Number: [id number] Date of Birth/Sex: Treating RN: 05-Nov-1950 (69 y.o. Female) [name] Primary Care [place] Brick: [name] RD, [name] Other Clinician: Referring Vitaly Wanat: Treating Londyn Wotton/Extender: [name] RD, [place] [name] Weeks in Treatment: 3 Wound Status Wound Number: 8 Primary Diabetic Wound/Ulcer of the Lower Extremity Etiology: Wound Location: Left T Third oe Wound Open Wounding Event: Blister Status: Date Acquired: 02/23/2020 Comorbid Cataracts, Anemia, Asthma, Hypertension, Type II Diabetes, End Weeks Of Treatment: 1 History: Stage Renal Disease, Osteomyelitis, Neuropathy Clustered Wound: No Photos Photo Uploaded By: Mikeal Hawthorne on 03/03/2020 11:07:40 Wound Measurements Length: (cm) 0.1 Width: (cm) 0.1 Depth: (cm) 0.1 Area: (cm) 0.008 Volume: (cm) 0.001 % Reduction in Area: 94.3% % Reduction in Volume: 92.9% Epithelialization: Large (67-100%) Tunneling: No Undermining: No Wound Description Classification: Grade 2 Wound Margin: Flat and Intact Exudate Amount: Small Exudate Type: Serous Exudate Color: amber Foul Odor After Cleansing: No Slough/Fibrino No Wound Bed Granulation Amount: Large (67-100%) Exposed Structure Granulation Quality: Pink Fascia Exposed: No Necrotic Amount: None Present (0%) Fat Layer (Subcutaneous Tissue) Exposed: Yes Tendon Exposed: No Muscle Exposed: No Joint Exposed: No Bone Exposed: No Treatment Notes Wound #8 (Left Toe Third) 3. Primary Dressing Applied Calcium Alginate Ag 4. Secondary Dressing Roll Gauze Electronic Signature(s) Signed: 03/02/2020 5:05:13 PM By: [name] Entered  By: [name] on 03/02/2020 08:57:39 -------------------------------------------------------------------------------- Wound Assessment Details Patient Name: Date of Service: [place] [name], [place] [name] 03/02/2020 8:30 A M Medical Record Number: [id number] Patient Account Number: [id number] Date of  Birth/Sex: Treating RN: 10/24/1950 (69 y.o. Female) [name] Primary Care Aidenjames Heckmann: [name] RD, [name] Other Clinician: Referring Jaelynn Currier: Treating Olean Sangster/Extender: [name] RD, [name] Weeks in Treatment: 3 Wound Status Wound Number: 9 Primary Diabetic Wound/Ulcer of the Lower Extremity Etiology: Wound Location: Left, Anterior Lower Leg Wound Open Wounding Event: Gradually Appeared Status: Date Acquired: 03/02/2020 Comorbid Cataracts, Anemia, Asthma, Hypertension, Type II Diabetes, End Weeks Of Treatment: 0 History: Stage Renal Disease, Osteomyelitis, Neuropathy Clustered Wound: Yes Photos Photo Uploaded By: Mikeal Hawthorne on 03/03/2020 11:07:58 Wound Measurements Length: (cm) 5.1 Width: (cm) 1.5 Depth: (cm) 0.1 Clustered Quantity: 4 Area: (cm) 6.008 Volume: (cm) 0.601 % Reduction in Area: % Reduction in Volume: Epithelialization: None Tunneling: No Undermining: No Wound Description Classification: Grade 2 Wound Margin: Distinct, outline attached Exudate Amount: Small Exudate Type: Serosanguineous Exudate Color: red, brown Foul Odor After Cleansing: No Slough/Fibrino No Wound Bed Granulation Amount: Large (67-100%) Exposed Structure Granulation Quality: Red, Pink Fascia Exposed: No Necrotic Amount: None Present (0%) Fat Layer (Subcutaneous Tissue) Exposed: Yes Tendon Exposed: No Muscle Exposed: No Joint Exposed: No Bone Exposed: No Treatment Notes Wound #9 (Left, Anterior Lower Leg) 2. Periwound Care Moisturizing lotion 3. Primary Dressing Applied Calcium Alginate Ag 4. Secondary Dressing Dry Gauze 6. Support Layer Applied 3 layer  compression wrap Electronic Signature(s) Signed: 03/02/2020 5:05:13 PM By: [name] Entered By: [name] on 03/02/2020 08:59:07 -------------------------------------------------------------------------------- Vitals Details Patient Name: Date of Service: [name], [place] [name] 03/02/2020 8:30 A M Medical Record Number: [id number] Patient Account Number: [id number] Date of Birth/Sex: Treating RN: [birth year] (69 y.o. Female) [name] Primary Care Mackenzie Groom: [place], [place] Other Clinician: Referring Layten Aiken: Treating Aayra Hornbaker/Extender: [name] RD, [name] Weeks in Treatment: 3 Vital Signs Time Taken: 08:51 Temperature (F): 97.9 Height (in): 67 Pulse (bpm): 83 Weight (lbs): 202 Respiratory Rate (breaths/min): 18 Body Mass Index (BMI): 31.6 Blood Pressure (mmHg): 101/67 Reference Range: 80 - 120 mg / dl Electronic Signature(s) Signed: 03/03/2020 1:13:21 PM By: [name] Entered By: [name] on 03/02/2020 08:51:52

## 2020-03-03 NOTE — Progress Notes (Signed)
MAKITA, BLOW ([id number]) Visit Report for 03/02/2020 HPI Details Patient Name: Date of Service: [name], [name] 03/02/2020 8:30 A M Medical Record Number: [id number] Patient Account Number: [id number] Date of Birth/Sex: Treating RN: 1951/03/21 (69 y.o. Female) Levan Hurst Primary Care Provider: [name] RD, [place] Other Clinician: Referring Provider: Treating Provider/Extender: Lyla Son RD, [name] Weeks in Treatment: 3 History of Present Illness HPI Description: Evaluate7/21/2021 today patient presents for evaluation of ulcerations on her legs which she tells me tend to come and go as far as small blistering locations and sometimes are better than other times. Right now she tells me that this is actually doing a little bit better but nonetheless will not completely go away. She has ulcerations bilaterally on her lower extremities. The right is worse than the left. 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 The patient has no evidence of active infection at this time. She however appears to have proficient blood flow into the extremities and I think would tolerate a 3 layer compression wrap she was noncompressible on arterial studies. Nonetheless there was no obvious signs of occlusion. 02/16/2020 on evaluation today patient appears to be doing much better in regard to her wounds in general. On the past week she has made significant improvements which is great news there is no signs of active infection at this time. No fevers, chills, nausea, vomiting, or diarrhea. 02/23/2020 on evaluation today patient appears to be doing well in regard to her lower extremity ulcers and ankle ulcers. Overall I feel like she is making great progress and in general I am extremely pleased with how things stand. There is no sign of active infection at this time which is also good news.  She will require slight debridement around the right ankle region. 03/02/20-Patient returns at 1 week, has few new areas open on the left leg, especially 1 healed area on the medial ankle that has slight small open area now, to anterior leg areas that started out as blisters that are open now. She was using juxta lite compression to the left, she used Band-Aid to cover the vesicle that had ruptured on the left anterior leg, we are using 3 layer compression on the right. We are using silver alginate to the toe wound on the left. Electronic Signature(s) Signed: 03/02/2020 9:20:57 AM By: Tobi Bastos MD, MBA Entered By: Tobi Bastos on 03/02/2020 09:20:57 -------------------------------------------------------------------------------- Physical Exam Details Patient Name: Date of Service: [name], [place] [name] 03/02/2020 8:30 A M Medical Record Number: [id number] Patient Account Number: [id number] Date of Birth/Sex: Treating RN: 24-Jun-1951 (69 y.o. Female) Levan Hurst Primary Care Provider: [place], [place] Other Clinician: Referring Provider: Treating Provider/Extender: Lyla Son RD, [name] Weeks in Treatment: 3 Constitutional alert and oriented x 3. sitting or standing blood pressure is within target range for patient.. supine blood pressure is within target range for patient.. pulse regular and within target range for patient.Marland Kitchen respirations regular, non-labored and within target range for patient.Marland Kitchen temperature within target range for patient.. . . Well- nourished and well-hydrated in no acute distress. Notes Left toe wound on the second toe dorsum looks better, the left medial ankle open area on surface of the heel wound, left anterior leg wounds a small. Electronic Signature(s) Signed: 03/02/2020 9:21:39 AM By: Tobi Bastos MD, MBA Entered By: Tobi Bastos on 03/02/2020 09:21:39 -------------------------------------------------------------------------------- Physician  Orders Details  Patient Name: Date of Service: [name], [name] 03/02/2020 8:30 A M Medical Record Number: [id number] Patient Account Number: [id number] Date of Birth/Sex: Treating RN: 1950-08-28 (69 y.o. Female) Levan Hurst Primary Care Provider: [name] RD, [place] Other Clinician: Referring Provider: Treating Provider/Extender: Lyla Son RD, [name] Weeks in Treatment: 3 Verbal / Phone Orders: No Diagnosis Coding ICD-10 Coding Code Description I87.2 Venous insufficiency (chronic) (peripheral) E11.621 Type 2 diabetes mellitus with foot ulcer L97.822 Non-pressure chronic ulcer of other part of left lower leg with fat layer exposed L97.312 Non-pressure chronic ulcer of right ankle with fat layer exposed L97.812 Non-pressure chronic ulcer of other part of right lower leg with fat layer exposed L97.512 Non-pressure chronic ulcer of other part of right foot with fat layer exposed N18.6 End stage renal disease Z99.2 Dependence on renal dialysis I10 Essential (primary) hypertension Follow-up Appointments Return Appointment in 1 week. Dressing Change Frequency Wound #10 Left,Medial Lower Leg Do not change entire dressing for one week. Wound #8 Left T Third oe Change Dressing every other day. Wound #9 Left,Anterior Lower Leg Do not change entire dressing for one week. Skin Barriers/Peri-Wound Care Moisturizing lotion - both legs Wound Cleansing May shower and wash wound with soap and water. Primary Wound Dressing Wound #10 Left,Medial Lower Leg Calcium Alginate with Silver Wound #8 Left T Third oe Calcium Alginate with Silver Wound #9 Left,Anterior Lower Leg Calcium Alginate with Silver Secondary Dressing Wound #10 Left,Medial Lower Leg Dry Gauze Wound #8 Left T Third oe Other: - bandaid or rolled gauze to secure Wound #9 Left,Anterior Lower Leg Dry Gauze Edema Control 3 Layer Compression System - Left Lower Extremity Avoid standing for long periods of  time Elevate legs to the level of the heart or above for 30 minutes daily and/or when sitting, a frequency of: - throughout the day Exercise regularly Support Garment 20-30 mm/Hg pressure to: - juxtalite compression garment right leg daily Electronic Signature(s) Signed: 03/02/2020 5:03:09 PM By: Tobi Bastos MD, MBA Signed: 03/03/2020 5:19:54 PM By: Levan Hurst RN, BSN Entered By: Levan Hurst on 03/02/2020 09:21:54 -------------------------------------------------------------------------------- Problem List Details Patient Name: Date of Service: [name], [place] [name] 03/02/2020 8:30 A M Medical Record Number: [id number] Patient Account Number: [id number] Date of Birth/Sex: Treating RN: Jul 03, 1951 (69 y.o. Female) Levan Hurst Primary Care Provider: [place], [place] Other Clinician: Referring Provider: Treating Provider/Extender: Lyla Son RD, [name] Weeks in Treatment: 3 Active Problems ICD-10 Encounter Code Description Active Date MDM Diagnosis I87.2 Venous insufficiency (chronic) (peripheral) 02/09/2020 No Yes E11.621 Type 2 diabetes mellitus with foot ulcer 02/09/2020 No Yes L97.822 Non-pressure chronic ulcer of other part of left lower leg with fat layer exposed7/21/2021 No Yes L97.312 Non-pressure chronic ulcer of right ankle with fat layer exposed 02/09/2020 No Yes L97.812 Non-pressure chronic ulcer of other part of right lower leg with fat layer 02/09/2020 No Yes exposed L97.512 Non-pressure chronic ulcer of other part of right foot with fat layer exposed 02/09/2020 No Yes N18.6 End stage renal disease 02/09/2020 No Yes Z99.2 Dependence on renal dialysis 02/09/2020 No Yes I10 Essential (primary) hypertension 02/09/2020 No Yes Inactive Problems Resolved Problems Electronic Signature(s) Signed: 03/02/2020 5:03:09 PM By: Tobi Bastos MD, MBA Signed: 03/03/2020 5:19:54 PM By: Levan Hurst RN, BSN Entered By: Levan Hurst on 03/02/2020  09:18:48 -------------------------------------------------------------------------------- Progress Note Details Patient Name: Date of Service: [name], [place] [name] 03/02/2020 8:30 A M Medical Record Number: [id number] Patient Account Number: [id number] Date of Birth/Sex: Treating  RN: 1950/09/28 (69 y.o. Female) Levan Hurst Primary Care Provider: [place], [place] Other Clinician: Referring Provider: Treating Provider/Extender: Lyla Son RD, [name] Weeks in Treatment: 3 Subjective History of Present Illness (HPI) Evaluate7/21/2021 today patient presents for evaluation of ulcerations on her legs which she tells me tend to come and go as far as small blistering locations and sometimes are better than other times. Right now she tells me that this is actually doing a little bit better but nonetheless will not completely go away. She has ulcerations bilaterally on her lower extremities. The right is worse than the left. 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 The patient has no evidence of active infection at this time. She however appears to have proficient blood flow into the extremities and I think would tolerate a 3 layer compression wrap she was noncompressible on arterial studies. Nonetheless there was no obvious signs of occlusion. 02/16/2020 on evaluation today patient appears to be doing much better in regard to her wounds in general. On the past week she has made significant improvements which is great news there is no signs of active infection at this time. No fevers, chills, nausea, vomiting, or diarrhea. 02/23/2020 on evaluation today patient appears to be doing well in regard to her lower extremity ulcers and ankle ulcers. Overall I feel like she is making great progress and in general I am extremely pleased with how things stand. There is no sign of active  infection at this time which is also good news. She will require slight debridement around the right ankle region. 03/02/20-Patient returns at 1 week, has few new areas open on the left leg, especially 1 healed area on the medial ankle that has slight small open area now, to anterior leg areas that started out as blisters that are open now. She was using juxta lite compression to the left, she used Band-Aid to cover the vesicle that had ruptured on the left anterior leg, we are using 3 layer compression on the right. We are using silver alginate to the toe wound on the left. Objective Constitutional alert and oriented x 3. sitting or standing blood pressure is within target range for patient.. supine blood pressure is within target range for patient.. pulse regular and within target range for patient.Marland Kitchen respirations regular, non-labored and within target range for patient.Marland Kitchen temperature within target range for patient.. Well- nourished and well-hydrated in no acute distress. Vitals Time Taken: 8:51 AM, Height: 67 in, Weight: 202 lbs, BMI: 31.6, Temperature: 97.9 F, Pulse: 83 bpm, Respiratory Rate: 18 breaths/min, Blood Pressure: 101/67 mmHg. General Notes: Left toe wound on the second toe dorsum looks better, the left medial ankle open area on surface of the heel wound, left anterior leg wounds a small. Integumentary (Hair, Skin) Wound #10 status is Open. Original cause of wound was Gradually Appeared. The wound is located on the Left,Medial Lower Leg. The wound measures 0.3cm length x 0.6cm width x 0.1cm depth; 0.141cm^2 area and 0.014cm^3 volume. There is Fat Layer (Subcutaneous Tissue) Exposed exposed. There is no tunneling or undermining noted. There is a small amount of serosanguineous drainage noted. The wound margin is distinct with the outline attached to the wound base. There is large (67-100%) red, pink granulation within the wound bed. There is no necrotic tissue within the wound  bed. Wound #3 status is Healed - Epithelialized. Original cause of wound was  Gradually Appeared. The wound is located on the Right,Medial Malleolus. The wound measures 0cm length x 0cm width x 0cm depth; 0cm^2 area and 0cm^3 volume. There is no tunneling or undermining noted. There is a none present amount of drainage noted. The wound margin is distinct with the outline attached to the wound base. There is no granulation within the wound bed. There is no necrotic tissue within the wound bed. Wound #8 status is Open. Original cause of wound was Blister. The wound is located on the Left T Third. The wound measures 0.1cm length x 0.1cm width x oe 0.1cm depth; 0.008cm^2 area and 0.001cm^3 volume. There is Fat Layer (Subcutaneous Tissue) Exposed exposed. There is no tunneling or undermining noted. There is a small amount of serous drainage noted. The wound margin is flat and intact. There is large (67-100%) pink granulation within the wound bed. There is no necrotic tissue within the wound bed. Wound #9 status is Open. Original cause of wound was Gradually Appeared. The wound is located on the Left,Anterior Lower Leg. The wound measures 5.1cm length x 1.5cm width x 0.1cm depth; 6.008cm^2 area and 0.601cm^3 volume. There is Fat Layer (Subcutaneous Tissue) Exposed exposed. There is no tunneling or undermining noted. There is a small amount of serosanguineous drainage noted. The wound margin is distinct with the outline attached to the wound base. There is large (67-100%) red, pink granulation within the wound bed. There is no necrotic tissue within the wound bed. 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 Dressing Change Frequency: Wound #8 Left T Third: oe Change Dressing every other day. Wound #10 Left,Medial Lower Leg: Do not change entire dressing for one week. Wound #9 Left,Anterior Lower Leg: Do not change entire dressing for one week. Skin Barriers/Peri-Wound Care: Moisturizing lotion - both legs Wound Cleansing: May shower and wash wound with soap and water. Primary Wound Dressing: Wound #10 Left,Medial Lower Leg: Calcium Alginate with Silver Wound #8 Left T Third: oe Calcium Alginate with Silver Wound #9 Left,Anterior Lower Leg: Calcium Alginate with Silver Secondary Dressing: Wound #8 Left T Third: oe Other: - bandaid or rolled gauze to secure Wound #10 Left,Medial Lower Leg: ABD pad Wound #9 Left,Anterior Lower Leg: ABD pad Edema Control: 3 Layer Compression System - Left Lower Extremity Avoid standing for long periods of time Elevate legs to the level of the heart or above for 30 minutes daily and/or when sitting, a frequency of: - throughout the day Exercise regularly Support Garment 20-30 mm/Hg pressure to: - juxtalite compression garment right leg daily >Continue current dressing silver alginate with adding compression to the left leg, Periwound care as before >Off loading by elevation >Elevate Extremity of concern above level of heart >Return to clinic next week Electronic Signature(s) Signed: 03/02/2020 9:22:30 AM By: Tobi Bastos MD, MBA Entered By: Tobi Bastos on 03/02/2020 09:22:30 -------------------------------------------------------------------------------- SuperBill Details Patient Name: Date of Service: [name], [place] [name] 03/02/2020 Medical Record Number: [id number] Patient Account Number: [id number] Date of  Birth/Sex: Treating RN: Sep 30, 1950 (69 y.o. Female) Levan Hurst  Primary Care Provider: [name] RD, [place] Other Clinician: Referring Provider: Treating Provider/Extender: Lyla Son RD, [name] Weeks in Treatment: 3 Diagnosis Coding ICD-10 Codes Code Description I87.2 Venous insufficiency (chronic) (peripheral) E11.621 Type 2 diabetes mellitus with foot ulcer L97.822 Non-pressure chronic ulcer of other part of left lower leg with fat layer exposed L97.312 Non-pressure chronic ulcer of right ankle with fat layer exposed L97.812 Non-pressure chronic ulcer of other part of right lower leg with fat layer exposed L97.512 Non-pressure chronic ulcer of other part of right foot with fat layer exposed N18.6 End stage renal disease Z99.2 Dependence on renal dialysis I10 Essential (primary) hypertension Facility Procedures CPT4 Code: [id number] Description: (Facility Use Only) [facility] LEG Modifier: Quantity: 1 Electronic Signature(s) Signed: 03/02/2020 5:03:09 PM By: Tobi Bastos MD, MBA Signed: 03/03/2020 5:19:54 PM By: Levan Hurst RN, BSN Previous Signature: 03/02/2020 9:22:46 AM Version By: Tobi Bastos MD, MBA Entered By: Levan Hurst on 03/02/2020 09:22:53

## 2020-03-07 DIAGNOSIS — D689 Coagulation defect, unspecified: Secondary | ICD-10-CM | POA: Diagnosis not present

## 2020-03-07 DIAGNOSIS — D631 Anemia in chronic kidney disease: Secondary | ICD-10-CM | POA: Diagnosis not present

## 2020-03-07 DIAGNOSIS — N2581 Secondary hyperparathyroidism of renal origin: Secondary | ICD-10-CM | POA: Diagnosis not present

## 2020-03-07 DIAGNOSIS — D509 Iron deficiency anemia, unspecified: Secondary | ICD-10-CM | POA: Diagnosis not present

## 2020-03-07 DIAGNOSIS — Z992 Dependence on renal dialysis: Secondary | ICD-10-CM | POA: Diagnosis not present

## 2020-03-07 DIAGNOSIS — N186 End stage renal disease: Secondary | ICD-10-CM | POA: Diagnosis not present

## 2020-03-08 ENCOUNTER — Encounter (HOSPITAL_BASED_OUTPATIENT_CLINIC_OR_DEPARTMENT_OTHER): Payer: Medicare Other | Admitting: Physician Assistant

## 2020-03-08 DIAGNOSIS — N186 End stage renal disease: Secondary | ICD-10-CM | POA: Diagnosis not present

## 2020-03-08 DIAGNOSIS — Z992 Dependence on renal dialysis: Secondary | ICD-10-CM | POA: Diagnosis not present

## 2020-03-08 DIAGNOSIS — L97812 Non-pressure chronic ulcer of other part of right lower leg with fat layer exposed: Secondary | ICD-10-CM | POA: Diagnosis not present

## 2020-03-08 DIAGNOSIS — I872 Venous insufficiency (chronic) (peripheral): Secondary | ICD-10-CM | POA: Diagnosis not present

## 2020-03-08 DIAGNOSIS — E1122 Type 2 diabetes mellitus with diabetic chronic kidney disease: Secondary | ICD-10-CM | POA: Diagnosis not present

## 2020-03-08 DIAGNOSIS — L97512 Non-pressure chronic ulcer of other part of right foot with fat layer exposed: Secondary | ICD-10-CM | POA: Diagnosis not present

## 2020-03-08 DIAGNOSIS — E11622 Type 2 diabetes mellitus with other skin ulcer: Secondary | ICD-10-CM | POA: Diagnosis not present

## 2020-03-08 DIAGNOSIS — L97312 Non-pressure chronic ulcer of right ankle with fat layer exposed: Secondary | ICD-10-CM | POA: Diagnosis not present

## 2020-03-08 DIAGNOSIS — L97822 Non-pressure chronic ulcer of other part of left lower leg with fat layer exposed: Secondary | ICD-10-CM | POA: Diagnosis not present

## 2020-03-08 DIAGNOSIS — E11621 Type 2 diabetes mellitus with foot ulcer: Secondary | ICD-10-CM | POA: Diagnosis not present

## 2020-03-08 DIAGNOSIS — I120 Hypertensive chronic kidney disease with stage 5 chronic kidney disease or end stage renal disease: Secondary | ICD-10-CM | POA: Diagnosis not present

## 2020-03-08 NOTE — Progress Notes (Addendum)
[name] ([id number]) Visit Report for 03/08/2020 Chief Complaint Document Details Patient Name: Date of Service: [name] [name], [name] 03/08/2020 3:45 PM Medical Record Number: [id number] Patient Account Number: [id number] Date of Birth/Sex: Treating RN: 03-12-1951 (69 y.o. [name] Primary Care Provider: [place], [name] Other Clinician: Referring Provider: Treating Provider/Extender: [name] RD, [name] Weeks in Treatment: 4 Information Obtained from: Patient Chief Complaint Multiple LE Ulcers Electronic Signature(s) Signed: 03/08/2020 3:29:36 PM By: [name] PA-C Entered By: [name] on 03/08/2020 15:29:36 -------------------------------------------------------------------------------- HPI Details Patient Name: Date of Service: [facility] [name], [name] 03/08/2020 3:45 PM Medical Record Number: [id number] Patient Account Number: [id number] Date of Birth/Sex: Treating RN: Apr 27, 1951 (69 y.o. [name] Primary Care Provider: [name] RD, [name] Other Clinician: Referring Provider: Treating Provider/Extender: [name] RD, [name] Weeks in Treatment: 4 History of Present Illness HPI Description: Evaluate7/21/2021 today patient presents for evaluation of ulcerations on her legs which she tells me tend to come and go as far as small blistering locations and sometimes are better than other times. Right now she tells me that this is actually doing a little bit better but nonetheless will not completely go away. She has ulcerations bilaterally on her lower extremities. The right is worse than the left. 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 The patient has no evidence of active infection at this time. She however appears to have proficient blood flow into the extremities and I think would tolerate a 3 layer compression wrap  she was noncompressible on arterial studies. Nonetheless there was no obvious signs of occlusion. 02/16/2020 on evaluation today patient appears to be doing much better in regard to her wounds in general. On the past week she has made significant improvements which is great news there is no signs of active infection at this time. No fevers, chills, nausea, vomiting, or diarrhea. 02/23/2020 on evaluation today patient appears to be doing well in regard to her lower extremity ulcers and ankle ulcers. Overall I feel like she is making great progress and in general I am extremely pleased with how things stand. There is no sign of active infection at this time which is also good news. She will require slight debridement around the right ankle region. 03/02/20-Patient returns at 1 week, has few new areas open on the left leg, especially 1 healed area on the medial ankle that has slight small open area now, to anterior leg areas that started out as blisters that are open now. She was using juxta lite compression to the left, she used Band-Aid to cover the vesicle that had ruptured on the left anterior leg, we are using 3 layer compression on the right. We are using silver alginate to the toe wound on the left. 03/08/2020 upon evaluation today patient unfortunately is doing much worse in regard to her right leg although her left leg looks like is doing better. Again we have taken her compression wraps off last week since she was doing well and transitioned her to her Velcro compression wrap. However apparently the patient is stating this was not put on here in the clinic and she never put it on at home. With that being said unfortunately she developed swelling and opened up new wounds which are present today she has quite a few of them in fact. Electronic Signature(s) Signed:  03/08/2020 4:58:42 PM By: [name] PA-C Entered By: [name] on 03/08/2020  16:58:42 -------------------------------------------------------------------------------- Physical Exam Details Patient Name: Date of Service: [facility] [name], [place] [name] 03/08/2020 3:45 PM Medical Record Number: [id number] Patient Account Number: [id number] Date of Birth/Sex: Treating RN: 1950-11-27 (70 y.o. [name] Primary Care Provider: [name] RD, [name] Other Clinician: Referring Provider: Treating Provider/Extender: [name] RD, [name] Weeks in Treatment: 4 Constitutional Well-nourished and well-hydrated in no acute distress. Respiratory normal breathing without difficulty. Psychiatric this patient is able to make decisions and demonstrates good insight into disease process. Alert and Oriented x 3. pleasant and cooperative. Notes Upon inspection patient's wounds appear to be somewhat dry because she has been leaving them open she did not know what to put on them she did not have any dressings and she can contact us and let us know. Obviously this is something that I would have wanted her to call about if she developed wound openings after being healed out. Electronic Signature(s) Signed: 03/08/2020 4:59:03 PM By: [name] PA-C Entered By: [name] on 03/08/2020 16:59:03 -------------------------------------------------------------------------------- Physician Orders Details Patient Name: Date of Service: [facility] [name], [place] [name] 03/08/2020 3:45 PM Medical Record Number: [id number] Patient Account Number: [id number] Date of Birth/Sex: Treating RN: 11-10-1950 (69 y.o. [name] Primary Care Provider: [name] RD, [name] Other Clinician: Referring Provider: Treating Provider/Extender: [name] RD, [name] Weeks in Treatment: 4 Verbal / Phone Orders: No Diagnosis Coding ICD-10 Coding Code Description I87.2 Venous insufficiency (chronic) (peripheral) E11.621 Type 2 diabetes mellitus with foot ulcer L97.822 Non-pressure chronic ulcer of  other part of left lower leg with fat layer exposed L97.312 Non-pressure chronic ulcer of right ankle with fat layer exposed L97.812 Non-pressure chronic ulcer of other part of right lower leg with fat layer exposed L97.512 Non-pressure chronic ulcer of other part of right foot with fat layer exposed N18.6 End stage renal disease Z99.2 Dependence on renal dialysis I10 Essential (primary) hypertension Follow-up Appointments Return Appointment in 1 week. Dressing Change Frequency Wound #10 Left,Medial Lower Leg Do not change entire dressing for one week. - both legs Wound #8 Left T Third oe Change Dressing every other day. Skin Barriers/Peri-Wound Care Moisturizing lotion - both legs Wound Cleansing May shower with protection. Primary Wound Dressing Wound #10 Left,Medial Lower Leg Calcium Alginate with Silver Wound #11 Right,Proximal,Medial Lower Leg Calcium Alginate with Silver Wound #12 Right,Distal,Medial Lower Leg Calcium Alginate with Silver Wound #3R Right,Medial Malleolus Calcium Alginate with Silver Wound #4R Right,Medial Lower Leg Calcium Alginate with Silver Wound #5R Right,Anterior Lower Leg Calcium Alginate with Silver Wound #8 Left T Third oe Calcium Alginate with Silver Secondary Dressing Wound #10 Left,Medial Lower Leg Dry Gauze Wound #8 Left T Third oe Other: - bandaid or rolled gauze to secure Wound #12 Right,Distal,Medial Lower Leg Dry Gauze ABD pad - right leg ulcers Edema Control 3 Layer Compression System - Bilateral Avoid standing for long periods of time Elevate legs to the level of the heart or above for 30 minutes daily and/or when sitting, a frequency of: - throughout the day Exercise regularly Electronic Signature(s) Signed: 03/08/2020 5:00:27 PM By: [name] PA-C Signed: 03/08/2020 5:37:29 PM By: [name] RN, BSN Entered By: [name] on 03/08/2020  16:57:33 -------------------------------------------------------------------------------- Problem List Details Patient Name: Date of Service: [facility] [name], [place] [name] 03/08/2020 3:45 PM Medical Record Number: [id number] Patient Account Number: [id number] Date of Birth/Sex: Treating  RN: 1950-10-30 (69 y.o. [name] Primary Care Provider: [name] RD, [name] Other Clinician: Referring Provider: Treating Provider/Extender: [name] RD, [name] Weeks in Treatment: 4 Active Problems ICD-10 Encounter Code Description Active Date MDM Diagnosis I87.2 Venous insufficiency (chronic) (peripheral) 02/09/2020 No Yes E11.621 Type 2 diabetes mellitus with foot ulcer 02/09/2020 No Yes L97.822 Non-pressure chronic ulcer of other part of left lower leg with fat layer exposed7/21/2021 No Yes L97.312 Non-pressure chronic ulcer of right ankle with fat layer exposed 02/09/2020 No Yes L97.812 Non-pressure chronic ulcer of other part of right lower leg with fat layer 02/09/2020 No Yes exposed L97.512 Non-pressure chronic ulcer of other part of right foot with fat layer exposed 02/09/2020 No Yes N18.6 End stage renal disease 02/09/2020 No Yes Z99.2 Dependence on renal dialysis 02/09/2020 No Yes I10 Essential (primary) hypertension 02/09/2020 No Yes Inactive Problems Resolved Problems Electronic Signature(s) Signed: 03/08/2020 3:29:30 PM By: [name] PA-C Entered By: [name] on 03/08/2020 15:29:30 -------------------------------------------------------------------------------- Progress Note Details Patient Name: Date of Service: [facility] [name], [place] [name] 03/08/2020 3:45 PM Medical Record Number: [id number] Patient Account Number: [id number] Date of Birth/Sex: Treating RN: 09-30-1950 (69 y.o. [name] Primary Care Provider: [name] RD, [name] Other Clinician: Referring Provider: Treating Provider/Extender: [name] RD, [name] Weeks in Treatment: 4 Subjective Chief  Complaint Information obtained from Patient Multiple LE Ulcers History of Present Illness (HPI) Evaluate7/21/2021 today patient presents for evaluation of ulcerations on her legs which she tells me tend to come and go as far as small blistering locations and sometimes are better than other times. Right now she tells me that this is actually doing a little bit better but nonetheless will not completely go away. She has ulcerations bilaterally on her lower extremities. The right is worse than the left. 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 The patient has no evidence of active infection at this time. She however appears to have proficient blood flow into the extremities and I think would tolerate a 3 layer compression wrap she was noncompressible on arterial studies. Nonetheless there was no obvious signs of occlusion. 02/16/2020 on evaluation today patient appears to be doing much better in regard to her wounds in general. On the past week she has made significant improvements which is great news there is no signs of active infection at this time. No fevers, chills, nausea, vomiting, or diarrhea. 02/23/2020 on evaluation today patient appears to be doing well in regard to her lower extremity ulcers and ankle ulcers. Overall I feel like she is making great progress and in general I am extremely pleased with how things stand. There is no sign of active infection at this time which is also good news. She will require slight debridement around the right ankle region. 03/02/20-Patient returns at 1 week, has few new areas open on the left leg, especially 1 healed area on the medial ankle that has slight small open area now, to anterior leg areas that started out as blisters that are open now. She was using juxta lite compression to the left, she used Band-Aid to cover the vesicle that had  ruptured on the left anterior leg, we are using 3 layer compression on the right. We are using silver alginate to the toe wound on the left. 03/08/2020 upon evaluation today patient unfortunately is doing much worse in regard to  her right leg although her left leg looks like is doing better. Again we have taken her compression wraps off last week since she was doing well and transitioned her to her Velcro compression wrap. However apparently the patient is stating this was not put on here in the clinic and she never put it on at home. With that being said unfortunately she developed swelling and opened up new wounds which are present today she has quite a few of them in fact. Objective Constitutional Well-nourished and well-hydrated in no acute distress. Vitals Time Taken: 4:10 PM, Height: 67 in, Weight: 202 lbs, BMI: 31.6, Temperature: 98.3 F, Pulse: 86 bpm, Respiratory Rate: 18 breaths/min, Blood Pressure: 108/70 mmHg. Respiratory normal breathing without difficulty. Psychiatric this patient is able to make decisions and demonstrates good insight into disease process. Alert and Oriented x 3. pleasant and cooperative. General Notes: Upon inspection patient's wounds appear to be somewhat dry because she has been leaving them open she did not know what to put on them she did not have any dressings and she can contact us and let us know. Obviously this is something that I would have wanted her to call about if she developed wound openings after being healed out. Integumentary (Hair, Skin) Wound #10 status is Open. Original cause of wound was Gradually Appeared. The wound is located on the Left,Medial Lower Leg. The wound measures 0.1cm length x 0.1cm width x 0.1cm depth; 0.008cm^2 area and 0.001cm^3 volume. There is no tunneling or undermining noted. There is a none present amount of drainage noted. The wound margin is distinct with the outline attached to the wound base. There is no granulation  within the wound bed. There is no necrotic tissue within the wound bed. Wound #11 status is Open. Original cause of wound was Blister. The wound is located on the Right,Proximal,Medial Lower Leg. The wound measures 2.5cm length x 1.8cm width x 0.1cm depth; 3.534cm^2 area and 0.353cm^3 volume. There is Fat Layer (Subcutaneous Tissue) Exposed exposed. There is no tunneling or undermining noted. There is a small amount of serosanguineous drainage noted. The wound margin is distinct with the outline attached to the wound base. There is medium (34-66%) pink granulation within the wound bed. There is a medium (34-66%) amount of necrotic tissue within the wound bed including Eschar and Adherent Slough. Wound #12 status is Open. Original cause of wound was Blister. The wound is located on the Right,Distal,Medial Lower Leg. The wound measures 1.4cm length x 2.3cm width x 0.1cm depth; 2.529cm^2 area and 0.253cm^3 volume. There is Fat Layer (Subcutaneous Tissue) Exposed exposed. There is no tunneling or undermining noted. There is a small amount of serosanguineous drainage noted. The wound margin is distinct with the outline attached to the wound base. There is medium (34-66%) pink granulation within the wound bed. There is a medium (34-66%) amount of necrotic tissue within the wound bed including Eschar and Adherent Slough. Wound #3R status is Open. Original cause of wound was Gradually Appeared. The wound is located on the Right,Medial Malleolus. The wound measures 2.9cm length x 4cm width x 0.1cm depth; 9.111cm^2 area and 0.911cm^3 volume. There is Fat Layer (Subcutaneous Tissue) Exposed exposed. There is no tunneling or undermining noted. There is a small amount of serosanguineous drainage noted. The wound margin is distinct with the outline attached to the wound base. There is medium (34-66%) pink granulation within the wound bed. There is a medium (34-66%) amount of necrotic tissue within the wound bed  including Eschar  and Adherent Slough. Wound #4R status is Open. Original cause of wound was Gradually Appeared. The wound is located on the Right,Medial Lower Leg. The wound measures 0.8cm length x 1.5cm width x 0.1cm depth; 0.942cm^2 area and 0.094cm^3 volume. There is Fat Layer (Subcutaneous Tissue) Exposed exposed. There is no tunneling or undermining noted. There is a small amount of serosanguineous drainage noted. The wound margin is distinct with the outline attached to the wound base. There is medium (34-66%) pink granulation within the wound bed. There is a medium (34-66%) amount of necrotic tissue within the wound bed including Eschar and Adherent Slough. Wound #5R status is Open. Original cause of wound was Gradually Appeared. The wound is located on the Right,Anterior Lower Leg. The wound measures 7.2cm length x 2cm width x 0.1cm depth; 11.31cm^2 area and 1.131cm^3 volume. There is Fat Layer (Subcutaneous Tissue) Exposed exposed. There is no tunneling or undermining noted. There is a small amount of serosanguineous drainage noted. The wound margin is distinct with the outline attached to the wound base. There is medium (34-66%) pink granulation within the wound bed. There is a medium (34-66%) amount of necrotic tissue within the wound bed including Eschar and Adherent Slough. Wound #8 status is Open. Original cause of wound was Blister. The wound is located on the Left T Third. The wound measures 0.1cm length x 0.1cm width x oe 0.1cm depth; 0.008cm^2 area and 0.001cm^3 volume. There is no tunneling or undermining noted. There is a small amount of serous drainage noted. The wound margin is flat and intact. There is small (1-33%) pink granulation within the wound bed. There is a large (67-100%) amount of necrotic tissue within the wound bed including Eschar. Wound #9 status is Healed - Epithelialized. Original cause of wound was Gradually Appeared. The wound is located on the Left,Anterior  Lower Leg. The wound measures 0cm length x 0cm width x 0cm depth; 0cm^2 area and 0cm^3 volume. There is no tunneling or undermining noted. There is a none present amount of drainage noted. The wound margin is distinct with the outline attached to the wound base. There is no granulation within the wound bed. There is no necrotic tissue within the wound bed. 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 There was a Three Layer Compression Therapy Procedure by [name], RN. Post procedure Diagnosis Wound #10: Same as Pre-Procedure Wound #3R Pre-procedure diagnosis of Wound #3R is a Diabetic Wound/Ulcer of the Lower Extremity located on the Right,Medial Malleolus . There was a Three Layer Compression Therapy Procedure by [name], RN. Post procedure Diagnosis Wound #3R: Same as Pre-Procedure Plan Follow-up Appointments: Return Appointment in 1 week. Dressing Change Frequency: Wound #10 Left,Medial Lower Leg: Do not change entire dressing for one week. - both legs Wound #8 Left T Third: oe Change Dressing every other day. Skin Barriers/Peri-Wound Care: Moisturizing lotion - both legs Wound Cleansing: May shower with protection. Primary Wound Dressing: Wound #10 Left,Medial Lower Leg: Calcium  Alginate with Silver Wound #11 Right,Proximal,Medial Lower Leg: Calcium Alginate  with Silver Wound #12 Right,Distal,Medial Lower Leg: Calcium Alginate with Silver Wound #3R Right,Medial Malleolus: Calcium Alginate with Silver Wound #4R Right,Medial Lower Leg: Calcium Alginate with Silver Wound #5R Right,Anterior Lower Leg: Calcium Alginate with Silver Wound #8 Left T Third: oe Calcium Alginate with Silver Secondary Dressing: Wound #10 Left,Medial Lower Leg: Dry Gauze Wound #8 Left T Third: oe Other: - bandaid or rolled gauze to secure Wound #12 Right,Distal,Medial Lower Leg: Dry Gauze ABD pad - right leg ulcers Edema Control: 3 Layer Compression System - Bilateral Avoid standing for long periods of time Elevate legs to the level of the heart or above for 30 minutes daily and/or when sitting, a frequency of: - throughout the day Exercise regularly 1. I am going to suggest that the patient continue to monitor for any signs of infection especially can as far as looking for any increase in pain although we will get me wrapping her so she will be able to look for erythema. 2. I do not see any signs of active infection at this time although again that is definitely a concern and something we will keep an eye out for. 3. I would recommend that we reinitiate the 3 layer compression wrap bilaterally. 4. She should bring her Velcro compression wraps with her next week we may institute that on the left and see how things go again a lot will depend just on how she seems to be progressing. We will see patient back for reevaluation in 1 week here in the clinic. If anything worsens or changes patient will contact our office for additional recommendations. Electronic Signature(s) Signed: 03/08/2020 4:59:54 PM By: [name] PA-C Entered By: [name] on 03/08/2020 16:59:53 -------------------------------------------------------------------------------- SuperBill Details Patient Name: Date of Service: [name], [place] [name] 03/08/2020 Medical Record Number:  [id number] Patient Account Number: [id number] Date of Birth/Sex: Treating RN: 22-Jan-1951 (69 y.o. [name] Primary Care Provider: [name] RD, [name] Other Clinician: Referring Provider: Treating Provider/Extender: [name] RD, [name] Weeks in Treatment: 4 Diagnosis Coding ICD-10 Codes Code Description I87.2 Venous insufficiency (chronic) (peripheral) E11.621 Type 2 diabetes mellitus with foot ulcer L97.822 Non-pressure chronic ulcer of other part of left lower leg with fat layer exposed L97.312 Non-pressure chronic ulcer of right ankle with fat layer exposed L97.812 Non-pressure chronic ulcer of other part of right lower leg with fat layer exposed L97.512 Non-pressure chronic ulcer of other part of right foot with fat layer exposed N18.6 End stage renal disease Z99.2 Dependence on renal dialysis I10 Essential (primary) hypertension Facility Procedures CPT4: Code [id number] foot Description: 1 BILATERAL: Application of multi-layer venous compression system; leg (below knee), including ankle and . Modifier: Quantity: 1 Physician Procedures : CPT4 Code Description Modifier [id number] - WC PHYS LEVEL 3 - EST PT ICD-10 Diagnosis Description I87.2 Venous insufficiency (chronic) (peripheral) E11.621 Type 2 diabetes mellitus with foot ulcer L97.822 Non-pressure chronic ulcer of other part of  left lower leg with fat layer exposed L97.312 Non-pressure chronic ulcer of right ankle with fat layer exposed Quantity: 1 Electronic Signature(s) Signed: 03/08/2020 5:00:07 PM By: [name] PA-C Entered By: [name] on 03/08/2020 17:00:07

## 2020-03-09 ENCOUNTER — Other Ambulatory Visit: Payer: Self-pay | Admitting: *Deleted

## 2020-03-09 DIAGNOSIS — N2581 Secondary hyperparathyroidism of renal origin: Secondary | ICD-10-CM | POA: Diagnosis not present

## 2020-03-09 DIAGNOSIS — Z992 Dependence on renal dialysis: Secondary | ICD-10-CM | POA: Diagnosis not present

## 2020-03-09 DIAGNOSIS — E1140 Type 2 diabetes mellitus with diabetic neuropathy, unspecified: Secondary | ICD-10-CM

## 2020-03-09 DIAGNOSIS — D509 Iron deficiency anemia, unspecified: Secondary | ICD-10-CM | POA: Diagnosis not present

## 2020-03-09 DIAGNOSIS — D631 Anemia in chronic kidney disease: Secondary | ICD-10-CM

## 2020-03-09 DIAGNOSIS — I10 Essential (primary) hypertension: Secondary | ICD-10-CM

## 2020-03-09 DIAGNOSIS — E7800 Pure hypercholesterolemia, unspecified: Secondary | ICD-10-CM

## 2020-03-09 DIAGNOSIS — N186 End stage renal disease: Secondary | ICD-10-CM | POA: Diagnosis not present

## 2020-03-09 DIAGNOSIS — Z79899 Other long term (current) drug therapy: Secondary | ICD-10-CM

## 2020-03-09 DIAGNOSIS — I2510 Atherosclerotic heart disease of native coronary artery without angina pectoris: Secondary | ICD-10-CM

## 2020-03-09 DIAGNOSIS — D689 Coagulation defect, unspecified: Secondary | ICD-10-CM | POA: Diagnosis not present

## 2020-03-09 DIAGNOSIS — N184 Chronic kidney disease, stage 4 (severe): Secondary | ICD-10-CM

## 2020-03-11 DIAGNOSIS — N186 End stage renal disease: Secondary | ICD-10-CM | POA: Diagnosis not present

## 2020-03-11 DIAGNOSIS — D509 Iron deficiency anemia, unspecified: Secondary | ICD-10-CM | POA: Diagnosis not present

## 2020-03-11 DIAGNOSIS — D631 Anemia in chronic kidney disease: Secondary | ICD-10-CM | POA: Diagnosis not present

## 2020-03-11 DIAGNOSIS — Z992 Dependence on renal dialysis: Secondary | ICD-10-CM | POA: Diagnosis not present

## 2020-03-11 DIAGNOSIS — D689 Coagulation defect, unspecified: Secondary | ICD-10-CM | POA: Diagnosis not present

## 2020-03-11 DIAGNOSIS — N2581 Secondary hyperparathyroidism of renal origin: Secondary | ICD-10-CM | POA: Diagnosis not present

## 2020-03-14 DIAGNOSIS — Z992 Dependence on renal dialysis: Secondary | ICD-10-CM | POA: Diagnosis not present

## 2020-03-14 DIAGNOSIS — D631 Anemia in chronic kidney disease: Secondary | ICD-10-CM | POA: Diagnosis not present

## 2020-03-14 DIAGNOSIS — N186 End stage renal disease: Secondary | ICD-10-CM | POA: Diagnosis not present

## 2020-03-14 DIAGNOSIS — D509 Iron deficiency anemia, unspecified: Secondary | ICD-10-CM | POA: Diagnosis not present

## 2020-03-14 DIAGNOSIS — N2581 Secondary hyperparathyroidism of renal origin: Secondary | ICD-10-CM | POA: Diagnosis not present

## 2020-03-14 DIAGNOSIS — D689 Coagulation defect, unspecified: Secondary | ICD-10-CM | POA: Diagnosis not present

## 2020-03-15 ENCOUNTER — Encounter (HOSPITAL_BASED_OUTPATIENT_CLINIC_OR_DEPARTMENT_OTHER): Payer: Medicare Other | Admitting: Physician Assistant

## 2020-03-15 DIAGNOSIS — L97312 Non-pressure chronic ulcer of right ankle with fat layer exposed: Secondary | ICD-10-CM | POA: Diagnosis not present

## 2020-03-15 DIAGNOSIS — N186 End stage renal disease: Secondary | ICD-10-CM | POA: Diagnosis not present

## 2020-03-15 DIAGNOSIS — E11622 Type 2 diabetes mellitus with other skin ulcer: Secondary | ICD-10-CM | POA: Diagnosis not present

## 2020-03-15 DIAGNOSIS — L97512 Non-pressure chronic ulcer of other part of right foot with fat layer exposed: Secondary | ICD-10-CM | POA: Diagnosis not present

## 2020-03-15 DIAGNOSIS — E1122 Type 2 diabetes mellitus with diabetic chronic kidney disease: Secondary | ICD-10-CM | POA: Diagnosis not present

## 2020-03-15 DIAGNOSIS — Z992 Dependence on renal dialysis: Secondary | ICD-10-CM | POA: Diagnosis not present

## 2020-03-15 DIAGNOSIS — L97812 Non-pressure chronic ulcer of other part of right lower leg with fat layer exposed: Secondary | ICD-10-CM | POA: Diagnosis not present

## 2020-03-15 DIAGNOSIS — L97822 Non-pressure chronic ulcer of other part of left lower leg with fat layer exposed: Secondary | ICD-10-CM | POA: Diagnosis not present

## 2020-03-15 DIAGNOSIS — E11621 Type 2 diabetes mellitus with foot ulcer: Secondary | ICD-10-CM | POA: Diagnosis not present

## 2020-03-15 DIAGNOSIS — L97212 Non-pressure chronic ulcer of right calf with fat layer exposed: Secondary | ICD-10-CM | POA: Diagnosis not present

## 2020-03-15 DIAGNOSIS — I120 Hypertensive chronic kidney disease with stage 5 chronic kidney disease or end stage renal disease: Secondary | ICD-10-CM | POA: Diagnosis not present

## 2020-03-15 DIAGNOSIS — I872 Venous insufficiency (chronic) (peripheral): Secondary | ICD-10-CM | POA: Diagnosis not present

## 2020-03-15 NOTE — Progress Notes (Addendum)
[name] ([id number]) Visit Report for 03/15/2020 Chief Complaint Document Details Patient Name: Date of Service: [name] [name], [name] 03/15/2020 9:45 A M Medical Record Number: [id number] Patient Account Number: [id number] Date of Birth/Sex: Treating RN: 1950-11-15 (69 y.o. [name] Primary Care Provider: [place], [name] Other Clinician: Referring Provider: Treating Provider/Extender: [name] RD, [name] Weeks in Treatment: 5 Information Obtained from: Patient Chief Complaint Multiple LE Ulcers Electronic Signature(s) Signed: 03/15/2020 11:01:59 AM By: [name] PA-C Entered By: [name] on 03/15/2020 11:01:59 -------------------------------------------------------------------------------- HPI Details Patient Name: Date of Service: [facility] [name], [place] [name] 03/15/2020 9:45 A M Medical Record Number: [id number] Patient Account Number: [id number] Date of Birth/Sex: Treating RN: [birth year] (69 y.o. [name] Primary Care Provider: [name] RD, [name] Other Clinician: Referring Provider: Treating Provider/Extender: [name] RD, [name] Weeks in Treatment: 5 History of Present Illness HPI Description: Evaluate7/21/2021 today patient presents for evaluation of ulcerations on her legs which she tells me tend to come and go as far as small blistering locations and sometimes are better than other times. Right now she tells me that this is actually doing a little bit better but nonetheless will not completely go away. She has ulcerations bilaterally on her lower extremities. The right is worse than the left. 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 The patient has no evidence of active infection at this time. She however appears to have proficient blood flow into the extremities and I think would tolerate a 3 layer compression  wrap she was noncompressible on arterial studies. Nonetheless there was no obvious signs of occlusion. 02/16/2020 on evaluation today patient appears to be doing much better in regard to her wounds in general. On the past week she has made significant improvements which is great news there is no signs of active infection at this time. No fevers, chills, nausea, vomiting, or diarrhea. 02/23/2020 on evaluation today patient appears to be doing well in regard to her lower extremity ulcers and ankle ulcers. Overall I feel like she is making great progress and in general I am extremely pleased with how things stand. There is no sign of active infection at this time which is also good news. She will require slight debridement around the right ankle region. 03/02/20-Patient returns at 1 week, has few new areas open on the left leg, especially 1 healed area on the medial ankle that has slight small open area now, to anterior leg areas that started out as blisters that are open now. She was using juxta lite compression to the left, she used Band-Aid to cover the vesicle that had ruptured on the left anterior leg, we are using 3 layer compression on the right. We are using silver alginate to the toe wound on the left. 03/08/2020 upon evaluation today patient unfortunately is doing much worse in regard to her right leg although her left leg looks like is doing better. Again we have taken her compression wraps off last week since she was doing well and transitioned her to her Velcro compression wrap. However apparently the patient is stating this was not put on here in the clinic and she never put it on at home. With that being said unfortunately she developed swelling and opened up new wounds which are present today she has quite a few of them in fact. 03/15/2020  on evaluation today patient appears to be doing well with regard to her wounds on the lower extremities bilaterally. Fortunately there is no signs of active  infection at this time. No fever chills noted. Overall I am actually very pleased with where things stand currently. Electronic Signature(s) Signed: 03/15/2020 11:29:36 AM By: [name] PA-C Entered By: [name] on 03/15/2020 11:29:35 -------------------------------------------------------------------------------- Physical Exam Details Patient Name: Date of Service: [name] [name], [name] 03/15/2020 9:45 A M Medical Record Number: [id number] Patient Account Number: [id number] Date of Birth/Sex: Treating RN: 10-Sep-1950 (69 y.o. [name] Primary Care Provider: [name] RD, [name] Other Clinician: Referring Provider: Treating Provider/Extender: [name] RD, [name] Weeks in Treatment: 5 Constitutional Well-nourished and well-hydrated in no acute distress. Respiratory normal breathing without difficulty. Psychiatric this patient is able to make decisions and demonstrates good insight into disease process. Alert and Oriented x 3. pleasant and cooperative. Notes Patient's wounds currently showed signs of good granulation at this time that does not appear to be any evidence of active infection and overall she has a lot of new epithelial growth as well. In general I am very pleased with the progress that she has made. Electronic Signature(s) Signed: 03/15/2020 11:29:53 AM By: [name] PA-C Entered By: [name] on 03/15/2020 11:29:53 -------------------------------------------------------------------------------- Physician Orders Details Patient Name: Date of Service: [name] [name], [place] [name] 03/15/2020 9:45 A M Medical Record Number: [id number] Patient Account Number: [id number] Date of Birth/Sex: Treating RN: Sep 04, 1950 (68 y.o. [name] Primary Care Provider: [name] RD, [name] Other Clinician: Referring Provider: Treating Provider/Extender: [name] RD, [name] Weeks in Treatment: 5 Verbal / Phone Orders: No Diagnosis  Coding ICD-10 Coding Code Description I87.2 Venous insufficiency (chronic) (peripheral) E11.621 Type 2 diabetes mellitus with foot ulcer L97.822 Non-pressure chronic ulcer of other part of left lower leg with fat layer exposed L97.312 Non-pressure chronic ulcer of right ankle with fat layer exposed L97.812 Non-pressure chronic ulcer of other part of right lower leg with fat layer exposed L97.512 Non-pressure chronic ulcer of other part of right foot with fat layer exposed N18.6 End stage renal disease Z99.2 Dependence on renal dialysis I10 Essential (primary) hypertension Follow-up Appointments Return Appointment in 1 week. Dressing Change Frequency Wound #8 Left T Third oe Change Dressing every other day. Wound #12 Right,Distal,Medial Lower Leg Do not change entire dressing for one week. Wound #3R Right,Medial Malleolus Do not change entire dressing for one week. Skin Barriers/Peri-Wound Care Moisturizing lotion - both legs Wound Cleansing Wound #12 Right,Distal,Medial Lower Leg May shower with protection. Wound #3R Right,Medial Malleolus May shower with protection. Wound #8 Left T Third oe May shower and wash wound with soap and water. Primary Wound Dressing Wound #12 Right,Distal,Medial Lower Leg Calcium Alginate with Silver Wound #3R Right,Medial Malleolus Calcium Alginate with Silver Wound #8 Left T Third oe Calcium Alginate with Silver Secondary Dressing Wound #12 Right,Distal,Medial Lower Leg Dry Gauze Wound #3R Right,Medial Malleolus Dry Gauze Wound #8 Left T Third oe Other: - bandaid or rolled gauze to secure Edema Control 3 Layer Compression System - Right Lower Extremity Avoid standing for long periods of time Elevate legs to the level of the heart or above for 30 minutes daily and/or when sitting, a frequency of: - throughout the day Exercise regularly Support Garment 20-30 mm/Hg pressure to: - juxtalite to left leg daily, apply in morning and remove  at night Electronic Signature(s) Signed: 03/15/2020 6:40:41 PM  By: [name] RN, BSN Signed: 03/15/2020 7:06:00 PM By: [name] PA-C Entered By: [name] on 03/15/2020 11:07:45 -------------------------------------------------------------------------------- Problem List Details Patient Name: Date of Service: [facility] [name], [place] [name] 03/15/2020 9:45 A M Medical Record Number: [id number] Patient Account Number: [id number] Date of Birth/Sex: Treating RN: 09/26/1950 (69 y.o. [name] Primary Care Provider: [name] RD, [name] Other Clinician: Referring Provider: Treating Provider/Extender: [name] RD, [name] Weeks in Treatment: 5 Active Problems ICD-10 Encounter Code Description Active Date MDM Diagnosis I87.2 Venous insufficiency (chronic) (peripheral) 02/09/2020 No Yes E11.621 Type 2 diabetes mellitus with foot ulcer 02/09/2020 No Yes L97.822 Non-pressure chronic ulcer of other part of left lower leg with fat layer exposed7/21/2021 No Yes L97.312 Non-pressure chronic ulcer of right ankle with fat layer exposed 02/09/2020 No Yes L97.812 Non-pressure chronic ulcer of other part of right lower leg with fat layer 02/09/2020 No Yes exposed L97.512 Non-pressure chronic ulcer of other part of right foot with fat layer exposed 02/09/2020 No Yes N18.6 End stage renal disease 02/09/2020 No Yes Z99.2 Dependence on renal dialysis 02/09/2020 No Yes I10 Essential (primary) hypertension 02/09/2020 No Yes Inactive Problems Resolved Problems Electronic Signature(s) Signed: 03/15/2020 11:01:50 AM By: [name] PA-C Entered By: [name] on 03/15/2020 11:01:49 -------------------------------------------------------------------------------- Progress Note Details Patient Name: Date of Service: [facility] [name], [name] 03/15/2020 9:45 A M Medical Record Number: [id number] Patient Account Number: [id number] Date of Birth/Sex: Treating RN: [birth year] (69 y.o. [name] Primary Care Provider: [name] RD, [name] Other Clinician: Referring Provider: Treating Provider/Extender: [name] RD, [name] Weeks in Treatment: 5 Subjective Chief Complaint Information obtained from Patient Multiple LE Ulcers History of Present Illness (HPI) Evaluate7/21/2021 today patient presents for evaluation of ulcerations on her legs which she tells me tend to come and go as far as small blistering locations and sometimes are better than other times. Right now she tells me that this is actually doing a little bit better but nonetheless will not completely go away. She has ulcerations bilaterally on her lower extremities. The right is worse than the left. 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 The patient has no evidence of active infection at this time. She however appears to have proficient blood flow into the extremities and I think would tolerate a 3 layer compression wrap she was noncompressible on arterial studies. Nonetheless there was no obvious signs of occlusion. 02/16/2020 on evaluation today patient appears to be doing much better in regard to her wounds in general. On the past week she has made significant improvements which is great news there is no signs of active infection at this time. No fevers, chills, nausea, vomiting, or diarrhea. 02/23/2020 on evaluation today patient appears to be doing well in regard to her lower extremity ulcers and ankle ulcers. Overall I feel like she is making great progress and in general I am extremely pleased with how things stand. There is no sign of active infection at this time which is also good news. She will require slight debridement around the right ankle region. 03/02/20-Patient returns at 1 week, has few new areas open on the left leg, especially 1 healed area on the medial ankle that has slight  small open area now, to anterior leg areas that started out as blisters that are open now. She was using juxta lite compression  to the left, she used Band-Aid to cover the vesicle that had ruptured on the left anterior leg, we are using 3 layer compression on the right. We are using silver alginate to the toe wound on the left. 03/08/2020 upon evaluation today patient unfortunately is doing much worse in regard to her right leg although her left leg looks like is doing better. Again we have taken her compression wraps off last week since she was doing well and transitioned her to her Velcro compression wrap. However apparently the patient is stating this was not put on here in the clinic and she never put it on at home. With that being said unfortunately she developed swelling and opened up new wounds which are present today she has quite a few of them in fact. 03/15/2020 on evaluation today patient appears to be doing well with regard to her wounds on the lower extremities bilaterally. Fortunately there is no signs of active infection at this time. No fever chills noted. Overall I am actually very pleased with where things stand currently. Objective Constitutional Well-nourished and well-hydrated in no acute distress. Vitals Time Taken: 10:38 AM, Height: 67 in, Weight: 202 lbs, BMI: 31.6, Temperature: 98.0 F, Pulse: 77 bpm, Respiratory Rate: 18 breaths/min, Blood Pressure: 99/51 mmHg. Respiratory normal breathing without difficulty. Psychiatric this patient is able to make decisions and demonstrates good insight into disease process. Alert and Oriented x 3. pleasant and cooperative. General Notes: Patient's wounds currently showed signs of good granulation at this time that does not appear to be any evidence of active infection and overall she has a lot of new epithelial growth as well. In general I am very pleased with the progress that she has made. Integumentary (Hair, Skin) Wound #10 status  is Healed - Epithelialized. Original cause of wound was Gradually Appeared. The wound is located on the Left,Medial Lower Leg. The wound measures 0cm length x 0cm width x 0cm depth; 0cm^2 area and 0cm^3 volume. There is no tunneling or undermining noted. There is a none present amount of drainage noted. The wound margin is distinct with the outline attached to the wound base. There is no granulation within the wound bed. There is no necrotic tissue within the wound bed. Wound #11 status is Healed - Epithelialized. Original cause of wound was Blister. The wound is located on the Right,Proximal,Medial Lower Leg. The wound measures 0cm length x 0cm width x 0cm depth; 0cm^2 area and 0cm^3 volume. There is no tunneling or undermining noted. There is a none present amount of drainage noted. The wound margin is distinct with the outline attached to the wound base. There is no granulation within the wound bed. There is no necrotic tissue within the wound bed. Wound #12 status is Open. Original cause of wound was Blister. The wound is located on the Right,Distal,Medial Lower Leg. The wound measures 0.2cm length x 0.2cm width x 0.1cm depth; 0.031cm^2 area and 0.003cm^3 volume. There is Fat Layer (Subcutaneous Tissue) exposed. There is no tunneling or undermining noted. There is a small amount of serous drainage noted. The wound margin is distinct with the outline attached to the wound base. There is large (67-100%) red granulation within the wound bed. There is no necrotic tissue within the wound bed. Wound #3R status is Open. Original cause of wound was Gradually Appeared. The wound is located on the Right,Medial Malleolus. The wound measures 0.3cm length x 0.2cm width x 0.2cm depth; 0.047cm^2 area and 0.009cm^3 volume. There is Fat Layer (Subcutaneous  Tissue) exposed. There is no tunneling or undermining noted. There is a small amount of serosanguineous drainage noted. The wound margin is distinct with the  outline attached to the wound base. There is large (67-100%) red granulation within the wound bed. There is no necrotic tissue within the wound bed. Wound #4R status is Healed - Epithelialized. Original cause of wound was Gradually Appeared. The wound is located on the Right,Medial Lower Leg. The wound measures 0cm length x 0cm width x 0cm depth; 0cm^2 area and 0cm^3 volume. There is no tunneling or undermining noted. There is a none present amount of drainage noted. The wound margin is distinct with the outline attached to the wound base. There is no granulation within the wound bed. There is no necrotic tissue within the wound bed. Wound #5R status is Healed - Epithelialized. Original cause of wound was Gradually Appeared. The wound is located on the Right,Anterior Lower Leg. The wound measures 0cm length x 0cm width x 0cm depth; 0cm^2 area and 0cm^3 volume. There is no tunneling or undermining noted. There is a none present amount of drainage noted. The wound margin is distinct with the outline attached to the wound base. There is no granulation within the wound bed. There is no necrotic tissue within the wound bed. Wound #8 status is Open. Original cause of wound was Blister. The wound is located on the Left T Third. The wound measures 0.4cm length x 0.6cm width x oe 0.1cm depth; 0.188cm^2 area and 0.019cm^3 volume. There is Fat Layer (Subcutaneous Tissue) exposed. There is no tunneling or undermining noted. There is a small amount of serous drainage noted. The wound margin is flat and intact. There is no granulation within the wound bed. There is a large (67-100%) amount of necrotic tissue within the wound bed including Eschar. 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 There was a Three Layer Compression Therapy Procedure by [name], RN. Post procedure Diagnosis Wound #3R: Same as Pre-Procedure Plan Follow-up Appointments: Return Appointment in 1 week. Dressing Change Frequency: Wound #8 Left T Third: oe Change Dressing every other day. Wound #12 Right,Distal,Medial Lower Leg: Do not change entire dressing for one week. Wound #3R Right,Medial Malleolus: Do not change entire dressing for one week. Skin Barriers/Peri-Wound Care: Moisturizing lotion - both legs Wound Cleansing: Wound #12 Right,Distal,Medial Lower Leg: May shower with protection. Wound #3R Right,Medial Malleolus: May shower with protection. Wound #8 Left T Third: oe May shower and wash wound with soap and water. Primary Wound Dressing: Wound #12 Right,Distal,Medial Lower Leg: Calcium Alginate with Silver Wound #3R Right,Medial Malleolus: Calcium Alginate with Silver Wound #8 Left T Third: oe Calcium Alginate with Silver Secondary Dressing: Wound #12 Right,Distal,Medial Lower Leg: Dry Gauze Wound #3R Right,Medial Malleolus: Dry Gauze Wound #8 Left T Third: oe Other: - bandaid or rolled gauze to secure Edema Control: 3 Layer Compression System - Right Lower Extremity Avoid standing for long periods of time Elevate legs to the  level of the heart or above for 30 minutes daily and/or when sitting, a frequency of: - throughout the day Exercise regularly Support Garment 20-30 mm/Hg pressure to: - juxtalite to left leg daily, apply in morning and remove at night 1. I am to  recommend at this point that we go ahead and initiate a continuation of the compression wrap with alginate for the right lower extremity. I think that still the best thing to do. 2. With regard to the toe on the left foot I would recommend Betadine followed by an alginate dressing and a Band-Aid to secure in place. 3. We will initiate the Velcro compression wrap to the left lower extremity hopefully that will do well for her and keep this under control. If not then we will have to discuss where to go next with this. We will see how things look next week. We will see patient back for reevaluation in 1 week here in the clinic. If anything worsens or changes patient will contact our office for additional recommendations. Electronic Signature(s) Signed: 03/15/2020 11:30:37 AM By: [name] PA-C Entered By: [name] on 03/15/2020 11:30:36 -------------------------------------------------------------------------------- SuperBill Details Patient Name: Date of Service: [facility] [name], [place] [name] 03/15/2020 Medical Record Number: [id number] Patient Account Number: [id number] Date of Birth/Sex: Treating RN: [birth year] (69 y.o. [name] Primary Care Provider: [name] RD, [name] Other Clinician: Referring Provider: Treating Provider/Extender: [name] RD, [name] Weeks in Treatment: 5 Diagnosis Coding ICD-10 Codes Code Description I87.2 Venous insufficiency (chronic) (peripheral) E11.621 Type 2 diabetes mellitus with foot ulcer L97.822 Non-pressure chronic ulcer of other part of left lower leg with fat layer exposed L97.312 Non-pressure chronic ulcer of right ankle with fat layer exposed L97.812 Non-pressure chronic ulcer of other part of right lower leg with fat layer exposed L97.512 Non-pressure chronic ulcer of other part of right foot with fat layer exposed N18.6 End stage renal disease Z99.2 Dependence on renal dialysis I10 Essential (primary) hypertension Facility  Procedures CPT4 Code: [id number] Description: (Facility Use Only) [phone] - APPLY MULTLAY COMPRS LWR RT LEG Modifier: Quantity: 1 Physician Procedures : CPT4 Code Description Modifier [id number] - WC PHYS LEVEL 3 - EST PT ICD-10 Diagnosis Description I87.2 Venous insufficiency (chronic) (peripheral) E11.621 Type 2 diabetes mellitus with foot ulcer L97.822 Non-pressure chronic ulcer of other part of  left lower leg with fat layer exposed L97.312 Non-pressure chronic ulcer of right ankle with fat layer exposed Quantity: 1 Electronic Signature(s) Signed: 03/15/2020 11:30:55 AM By: [name] PA-C Entered By: [name] on 03/15/2020 11:30:54

## 2020-03-16 DIAGNOSIS — D509 Iron deficiency anemia, unspecified: Secondary | ICD-10-CM | POA: Diagnosis not present

## 2020-03-16 DIAGNOSIS — Z992 Dependence on renal dialysis: Secondary | ICD-10-CM | POA: Diagnosis not present

## 2020-03-16 DIAGNOSIS — D631 Anemia in chronic kidney disease: Secondary | ICD-10-CM | POA: Diagnosis not present

## 2020-03-16 DIAGNOSIS — D689 Coagulation defect, unspecified: Secondary | ICD-10-CM | POA: Diagnosis not present

## 2020-03-16 DIAGNOSIS — N186 End stage renal disease: Secondary | ICD-10-CM | POA: Diagnosis not present

## 2020-03-16 DIAGNOSIS — N2581 Secondary hyperparathyroidism of renal origin: Secondary | ICD-10-CM | POA: Diagnosis not present

## 2020-03-16 NOTE — Progress Notes (Signed)
JERNEE, MURTAUGH ([id number]) Visit Report for 03/15/2020 Arrival Information Details Patient Name: Date of Service: [name], [name] 03/15/2020 9:45 A M Medical Record Number: [id number] Patient Account Number: [id number] Date of Birth/Sex: Treating RN: 12-19-1950 (69 y.o. [name] Primary Care Tovia Kisner: [name] RD, [name] Other Clinician: Referring Channing Yeager: Treating Cherly Erno/Extender: [name] RD, [name] Weeks in Treatment: 5 Visit Information History Since Last Visit Added or deleted any medications: No Patient Arrived: Wheel Chair Any new allergies or adverse reactions: No Arrival Time: 10:38 Had a fall or experienced change in No Accompanied By: self activities of daily living that may affect Transfer Assistance: None risk of falls: Patient Identification Verified: Yes Signs or symptoms of abuse/neglect since last visito No Secondary Verification Process Completed: Yes Hospitalized since last visit: No Patient Has Alerts: Yes Implantable device outside of the clinic excluding No Patient Alerts: Right ABI: [place] cellular tissue based products placed in the center Left ABI: [place] since last visit: Has Dressing in Place as Prescribed: Yes Pain Present Now: No Electronic Signature(s) Signed: 03/16/2020 10:16:14 AM By: Sandre Kitty Entered By: Sandre Kitty on 03/15/2020 10:38:17 -------------------------------------------------------------------------------- Compression Therapy Details Patient Name: Date of Service: [name], [name] 03/15/2020 9:45 A M Medical Record Number: [id number] Patient Account Number: [id number] Date of Birth/Sex: Treating RN: 11-Jul-1951 (69 y.o. [name] Primary Care Malee Grays: [name] RD, [name] Other Clinician: Referring Drema Eddington: Treating Nitzia Perren/Extender: [name] RD, [name] Weeks in Treatment: 5 Compression Therapy Performed for Wound Assessment: Wound #3R Right,Medial Malleolus Performed By:  [name], RN Compression Type: Three Layer Post Procedure Diagnosis Same as Pre-procedure Electronic Signature(s) Signed: 03/15/2020 6:40:41 PM By: [name] RN, BSN Entered By: [name] on 03/15/2020 11:04:36 -------------------------------------------------------------------------------- Encounter Discharge Information Details Patient Name: Date of Service: [facility] [name], [place] [name] 03/15/2020 9:45 A M Medical Record Number: [id number] Patient Account Number: [id number] Date of Birth/Sex: Treating RN: [birth year] (69 y.o. Clearnce Sorrel Primary Care Issam Carlyon: [name] RD, [name] Other Clinician: Referring Lee-Ann Gal: Treating Milena Liggett/Extender: [name] RD, [name] Weeks in Treatment: 5 Encounter Discharge Information Items Discharge Condition: Stable Ambulatory Status: Wheelchair Discharge Destination: Home Transportation: Private Auto Accompanied By: self Schedule Follow-up Appointment: Yes Clinical Summary of Care: Patient Declined Electronic Signature(s) Signed: 03/15/2020 2:46:37 PM By: Kela Millin Entered By: Kela Millin on 03/15/2020 11:41:12 -------------------------------------------------------------------------------- Lower Extremity Assessment Details Patient Name: Date of Service: [facility] [name], [place] [name] 03/15/2020 9:45 A M Medical Record Number: [id number] Patient Account Number: [id number] Date of Birth/Sex: Treating RN: 03-29-1951 (69 y.o. [name] Primary Care Terryn Rosenkranz: [place] RD, [name] Other Clinician: Referring Deneane Stifter: Treating Kelley Polinsky/Extender: [name] RD, [name] Weeks in Treatment: 5 Edema Assessment Assessed: [Left: No] [Right: No] Edema: [Left: Yes] [Right: Yes] Calf Left: Right: Point of Measurement: 38 cm From Medial Instep 33.3 cm 36.3 cm Ankle Left: Right: Point of Measurement: 13 cm From Medial Instep 23.5 cm 25 cm Vascular Assessment Pulses: Dorsalis Pedis Palpable:  [Left:No] [Right:No] Electronic Signature(s) Signed: 03/15/2020 6:40:41 PM By: [name] RN, BSN Entered By: [name] on 03/15/2020 10:54:16 -------------------------------------------------------------------------------- Multi-Disciplinary Care Plan Details Patient Name: Date of Service: [facility] [name], [place] [name] 03/15/2020 9:45 A M Medical Record Number: [id number] Patient Account Number: [id number] Date of Birth/Sex: Treating RN: Jun 09, 1951 (69 y.o. [name] Primary Care Alayne Estrella: [place], [name] Other Clinician: Referring Deshanta Lady: Treating Adelin Ventrella/Extender: [name] RD, [place]  RREN Weeks in Treatment: 5 Active Inactive Venous Leg Ulcer Nursing Diagnoses: Knowledge deficit related to disease process and management Potential for venous Insuffiency (use before diagnosis confirmed) Goals: Patient will maintain optimal edema control Date Initiated: 02/09/2020 Target Resolution Date: 04/05/2020 Goal Status: Active Patient/caregiver will verbalize understanding of disease process and disease management Date Initiated: 02/09/2020 Date Inactivated: 03/02/2020 Target Resolution Date: 03/08/2020 Goal Status: Met Interventions: Assess peripheral edema status every visit. Compression as ordered Provide education on venous insufficiency Treatment Activities: Therapeutic compression applied : 02/09/2020 Notes: Wound/Skin Impairment Nursing Diagnoses: Impaired tissue integrity Knowledge deficit related to ulceration/compromised skin integrity Goals: Patient/caregiver will verbalize understanding of skin care regimen Date Initiated: 02/09/2020 Target Resolution Date: 04/05/2020 Goal Status: Active Ulcer/skin breakdown will have a volume reduction of 30% by week 4 Date Initiated: 02/09/2020 Date Inactivated: 03/02/2020 Target Resolution Date: 03/08/2020 Goal Status: Met Interventions: Assess patient/caregiver ability to obtain necessary supplies Assess  patient/caregiver ability to perform ulcer/skin care regimen upon admission and as needed Assess ulceration(s) every visit Provide education on ulcer and skin care Treatment Activities: Skin care regimen initiated : 02/09/2020 Topical wound management initiated : 02/09/2020 Notes: Electronic Signature(s) Signed: 03/15/2020 6:40:41 PM By: [name] RN, BSN Entered By: [name] on 03/15/2020 10:59:02 -------------------------------------------------------------------------------- Pain Assessment Details Patient Name: Date of Service: [name], [place] [name] 03/15/2020 9:45 A M Medical Record Number: [id number] Patient Account Number: [id number] Date of Birth/Sex: Treating RN: September 11, 1950 (69 y.o. [name] Primary Care Satvik Parco: [place], [name] Other Clinician: Referring Eden Rho: Treating Kaelan Amble/Extender: [name] RD, [name] Weeks in Treatment: 5 Active Problems Location of Pain Severity and Description of Pain Patient Has Paino No Site Locations Pain Management and Medication Current Pain Management: Electronic Signature(s) Signed: 03/15/2020 6:40:41 PM By: [name] RN, BSN Signed: 03/16/2020 10:16:14 AM By: Sandre Kitty Entered By: Sandre Kitty on 03/15/2020 10:38:47 -------------------------------------------------------------------------------- Patient/Caregiver Education Details Patient Name: Date of Service: [facility] [name], [place] [name] 8/25/2021andnbsp9:45 A M Medical Record Number: [id number] Patient Account Number: [id number] Date of Birth/Gender: Treating RN: 09/19/1950 (69 y.o. [name] Primary Care Physician: [place], [name] Other Clinician: Referring Physician: Treating Physician/Extender: [name] RD, [name] Weeks in Treatment: 5 Education Assessment Education Provided To: Patient Education Topics Provided Venous: Methods: Explain/Verbal Responses: Reinforcements needed, State content  correctly Wound/Skin Impairment: Methods: Explain/Verbal Responses: Reinforcements needed, State content correctly Electronic Signature(s) Signed: 03/15/2020 6:40:41 PM By: [name] RN, BSN Entered By: [name] on 03/15/2020 10:59:26 -------------------------------------------------------------------------------- Wound Assessment Details Patient Name: Date of Service: [facility] [name], [place] [name] 03/15/2020 9:45 A M Medical Record Number: [id number] Patient Account Number: [id number] Date of Birth/Sex: Treating RN: [birth year] (69 y.o. [name] Primary Care Blessing Zaucha: [name] RD, [name] Other Clinician: Referring Elexis Pollak: Treating Amando Ishikawa/Extender: [name] RD, [name] Weeks in Treatment: 5 Wound Status Wound Number: 10 Primary Diabetic Wound/Ulcer of the Lower Extremity Etiology: Wound Location: Left, Medial Lower Leg Wound Healed - Epithelialized Wounding Event: Gradually Appeared Status: Date Acquired: 03/02/2020 Comorbid Cataracts, Anemia, Asthma, Hypertension, Type II Diabetes, End Weeks Of Treatment: 1 History: Stage Renal Disease, Osteomyelitis, Neuropathy Clustered Wound: No Wound Measurements Length: (cm) Width: (cm) Depth: (cm) Area: (cm) Volume: (cm) 0 % Reduction in Area: 100% 0 % Reduction in Volume: 100% 0 Epithelialization: Large (67-100%) 0 Tunneling: No 0 Undermining: No Wound Description Classification: Grade 2 Wound Margin: Distinct, outline attached Exudate Amount: None Present Foul Odor After Cleansing: No Slough/Fibrino No  Wound Bed Granulation Amount: None Present (0%) Exposed Structure Necrotic Amount: None Present (0%) Fascia Exposed: No Fat Layer (Subcutaneous Tissue) Exposed: No Tendon Exposed: No Muscle Exposed: No Joint Exposed: No Bone Exposed: No Electronic Signature(s) Signed: 03/15/2020 6:40:41 PM By: [name] RN, BSN Entered By: [name] on 03/15/2020  10:54:42 -------------------------------------------------------------------------------- Wound Assessment Details Patient Name: Date of Service: [name], [place] [name] 03/15/2020 9:45 A M Medical Record Number: [id number] Patient Account Number: [id number] Date of Birth/Sex: Treating RN: 03-Oct-1950 (69 y.o. [name] Primary Care Clarinda Obi: [name] RD, [name] Other Clinician: Referring Lometa Riggin: Treating Vishnu Moeller/Extender: [name] RD, [name] Weeks in Treatment: 5 Wound Status Wound Number: 11 Primary Diabetic Wound/Ulcer of the Lower Extremity Etiology: Wound Location: Right, Proximal, Medial Lower Leg Wound Healed - Epithelialized Wounding Event: Blister Status: Date Acquired: 03/08/2020 Date Acquired: 03/08/2020 Comorbid Cataracts, Anemia, Asthma, Hypertension, Type II Diabetes, End Weeks Of Treatment: 1 History: Stage Renal Disease, Osteomyelitis, Neuropathy Clustered Wound: Yes Wound Measurements Length: (cm) Width: (cm) Depth: (cm) Clustered Quantity: Area: (cm) Volume: (cm) 0 % Reduction in Area: 100% 0 % Reduction in Volume: 100% 0 Epithelialization: Large (67-100%) 3 Tunneling: No 0 Undermining: No 0 Wound Description Classification: Grade 2 Wound Margin: Distinct, outline attached Exudate Amount: None Present Foul Odor After Cleansing: No Slough/Fibrino No Wound Bed Granulation Amount: None Present (0%) Exposed Structure Necrotic Amount: None Present (0%) Fascia Exposed: No Fat Layer (Subcutaneous Tissue) Exposed: No Tendon Exposed: No Muscle Exposed: No Joint Exposed: No Bone Exposed: No Electronic Signature(s) Signed: 03/15/2020 6:40:41 PM By: [name] RN, BSN Entered By: [name] on 03/15/2020 10:55:33 -------------------------------------------------------------------------------- Wound Assessment Details Patient Name: Date of Service: [name] 03/15/2020 9:45 A M Medical Record Number:  [id number] Patient Account Number: [id number] Date of Birth/Sex: Treating RN: 08-20-1950 (69 y.o. [name] Primary Care Toluwanimi Radebaugh: [name] RD, [name] Other Clinician: Referring Makoto Sellitto: Treating Kariel Skillman/Extender: [name] RD, [name] Weeks in Treatment: 5 Wound Status Wound Number: 12 Primary Diabetic Wound/Ulcer of the Lower Extremity Etiology: Wound Location: Right, Distal, Medial Lower Leg Wound Open Wounding Event: Blister Status: Date Acquired: 03/08/2020 Comorbid Cataracts, Anemia, Asthma, Hypertension, Type II Diabetes, End Weeks Of Treatment: 1 History: Stage Renal Disease, Osteomyelitis, Neuropathy Clustered Wound: No Wound Measurements Length: (cm) 0.2 Width: (cm) 0.2 Depth: (cm) 0.1 Area: (cm) 0.031 Volume: (cm) 0.003 % Reduction in Area: 98.8% % Reduction in Volume: 98.8% Epithelialization: Large (67-100%) Tunneling: No Undermining: No Wound Description Classification: Grade 2 Wound Margin: Distinct, outline attached Exudate Amount: Small Exudate Type: Serous Exudate Color: amber Foul Odor After Cleansing: No Slough/Fibrino No Wound Bed Granulation Amount: Large (67-100%) Exposed Structure Granulation Quality: Red Fascia Exposed: No Necrotic Amount: None Present (0%) Fat Layer (Subcutaneous Tissue) Exposed: Yes Tendon Exposed: No Muscle Exposed: No Joint Exposed: No Bone Exposed: No Treatment Notes Wound #12 (Right, Distal, Medial Lower Leg) 1. Cleanse With Wound Cleanser Soap and water 2. Periwound Care Moisturizing lotion 3. Primary Dressing Applied Calcium Alginate Ag 4. Secondary Dressing Dry Gauze 6. Support Layer Applied 3 layer compression Water quality scientist) Signed: 03/15/2020 6:40:41 PM By: [name] RN, BSN Entered By: [name] on 03/15/2020 10:56:36 -------------------------------------------------------------------------------- Wound Assessment Details Patient Name: Date of  Service: [facility] [name], [place] [name] 03/15/2020 9:45 A M Medical Record Number: [id number] Patient Account Number: [id number] Date of Birth/Sex: Treating RN: August 11, 1950 (69 y.o. [name] Primary Care Kiptyn Rafuse: [name] RD, [name] Other Clinician: Referring  Arlean Thies: Treating Aldine Chakraborty/Extender: [place] RD, [name] Weeks in Treatment: 5 Wound Status Wound Number: 3R Primary Diabetic Wound/Ulcer of the Lower Extremity Etiology: Wound Location: Right, Medial Malleolus Wound Open Wounding Event: Gradually Appeared Status: Date Acquired: 10/21/2019 Comorbid Cataracts, Anemia, Asthma, Hypertension, Type II Diabetes, End Weeks Of Treatment: 5 History: Stage Renal Disease, Osteomyelitis, Neuropathy Clustered Wound: Yes Wound Measurements Length: (cm) 0.3 Width: (cm) 0.2 Depth: (cm) 0.2 Clustered Quantity: 4 Area: (cm) 0.047 Volume: (cm) 0.009 % Reduction in Area: 99.3% % Reduction in Volume: 98.6% Epithelialization: None Tunneling: No Undermining: No Wound Description Classification: Grade 2 Wound Margin: Distinct, outline attached Exudate Amount: Small Exudate Type: Serosanguineous Exudate Color: red, brown Foul Odor After Cleansing: No Slough/Fibrino Yes Wound Bed Granulation Amount: Large (67-100%) Exposed Structure Granulation Quality: Red Fascia Exposed: No Necrotic Amount: None Present (0%) Fat Layer (Subcutaneous Tissue) Exposed: Yes Tendon Exposed: No Muscle Exposed: No Joint Exposed: No Bone Exposed: No Treatment Notes Wound #3R (Right, Medial Malleolus) 1. Cleanse With Wound Cleanser Soap and water 2. Periwound Care Moisturizing lotion 3. Primary Dressing Applied Calcium Alginate Ag 4. Secondary Dressing Dry Gauze 6. Support Layer Applied 3 layer compression Cytogeneticist) Signed: 03/15/2020 6:40:41 PM By: Zenaida Deed RN, BSN Entered By: Zenaida Deed on 03/15/2020  10:57:11 -------------------------------------------------------------------------------- Wound Assessment Details Patient Name: Date of Service: [facility] [name], [name] 03/15/2020 9:45 A M Medical Record Number: [id number] Patient Account Number: [id number] Date of Birth/Sex: Treating RN: 1951/01/06 (69 y.o. [name] Primary Care Jamariah Tony: Bolivar Haw RD, Berneice Gandy Other Clinician: Referring Rukaya Kleinschmidt: Treating Ainsley Deakins/Extender: [place] RD, [name] Weeks in Treatment: 5 Wound Status Wound Number: 4R Primary Diabetic Wound/Ulcer of the Lower Extremity Etiology: Wound Location: Right, Medial Lower Leg Wound Healed - Epithelialized Wounding Event: Gradually Appeared Status: Date Acquired: 10/21/2019 Comorbid Cataracts, Anemia, Asthma, Hypertension, Type II Diabetes, End Weeks Of Treatment: 5 History: Stage Renal Disease, Osteomyelitis, Neuropathy Clustered Wound: Yes Wound Measurements Length: (cm) Width: (cm) Depth: (cm) Clustered Quantity: Area: (cm) Volume: (cm) 0 % Reduction in Area: 100% 0 % Reduction in Volume: 100% 0 Epithelialization: Large (67-100%) 3 Tunneling: No 0 Undermining: No 0 Wound Description Classification: Grade 2 Wound Margin: Distinct, outline attached Exudate Amount: None Present Foul Odor After Cleansing: No Slough/Fibrino No Wound Bed Granulation Amount: None Present (0%) Exposed Structure Necrotic Amount: None Present (0%) Fascia Exposed: No Fat Layer (Subcutaneous Tissue) Exposed: No Tendon Exposed: No Muscle Exposed: No Joint Exposed: No Bone Exposed: No Electronic Signature(s) Signed: 03/15/2020 6:40:41 PM By: Zenaida Deed RN, BSN Entered By: Zenaida Deed on 03/15/2020 10:57:43 -------------------------------------------------------------------------------- Wound Assessment Details Patient Name: Date of Service: [name], [name] 03/15/2020 9:45 A M Medical Record Number: [id number] Patient Account Number:  [id number] Date of Birth/Sex: Treating RN: 06-10-1951 (69 y.o. [name] Primary Care Sirinity Outland: Bolivar Haw RD, Berneice Gandy Other Clinician: Referring Giovanne Nickolson: Treating Kristan Brummitt/Extender: Winn Jock RD, [name] Weeks in Treatment: 5 Wound Status Wound Number: 5R Primary Diabetic Wound/Ulcer of the Lower Extremity Etiology: Wound Location: Right, Anterior Lower Leg Wound Healed - Epithelialized Wounding Event: Gradually Appeared Status: Date Acquired: 10/21/2019 Comorbid Cataracts, Anemia, Asthma, Hypertension, Type II Diabetes, End Weeks Of Treatment: 5 History: Stage Renal Disease, Osteomyelitis, Neuropathy Clustered Wound: Yes Wound Measurements Length: (cm) Width: (cm) Depth: (cm) Clustered Quantity: Area: (cm) Volume: (cm) 0 % Reduction in Area: 100% 0 % Reduction in Volume: 100% 0 Epithelialization: Large (67-100%) 5 Tunneling: No 0 Undermining: No 0  Wound Description Classification: Grade 2 Wound Margin: Distinct, outline attached Exudate Amount: None Present Foul Odor After Cleansing: No Slough/Fibrino No Wound Bed Granulation Amount: None Present (0%) Exposed Structure Necrotic Amount: None Present (0%) Fascia Exposed: No Fat Layer (Subcutaneous Tissue) Exposed: No Tendon Exposed: No Muscle Exposed: No Joint Exposed: No Bone Exposed: No Electronic Signature(s) Signed: 03/15/2020 6:40:41 PM By: [name] RN, BSN Entered By: [name] on 03/15/2020 10:58:13 -------------------------------------------------------------------------------- Wound Assessment Details Patient Name: Date of Service: [name], [name] 03/15/2020 9:45 A M Medical Record Number: [id number] Patient Account Number: [id number] Date of Birth/Sex: Treating RN: Jan 26, 1951 (69 y.o. [name] Primary Care Jurni Cesaro: [name] RD, [name] Other Clinician: Referring Omaira Mellen: Treating Shivan Hodes/Extender: [name] RD, [name] Weeks in Treatment:  5 Wound Status Wound Number: 8 Primary Diabetic Wound/Ulcer of the Lower Extremity Etiology: Wound Location: Left T Third oe Wound Open Wounding Event: Blister Status: Status: Date Acquired: 02/23/2020 Comorbid Cataracts, Anemia, Asthma, Hypertension, Type II Diabetes, End Weeks Of Treatment: 3 History: Stage Renal Disease, Osteomyelitis, Neuropathy Clustered Wound: No Wound Measurements Length: (cm) 0.4 Width: (cm) 0.6 Depth: (cm) 0.1 Area: (cm) 0.188 Volume: (cm) 0.019 % Reduction in Area: -33.3% % Reduction in Volume: -35.7% Epithelialization: Small (1-33%) Tunneling: No Undermining: No Wound Description Classification: Grade 2 Wound Margin: Flat and Intact Exudate Amount: Small Exudate Type: Serous Exudate Color: amber Foul Odor After Cleansing: No Slough/Fibrino No Wound Bed Granulation Amount: None Present (0%) Exposed Structure Necrotic Amount: Large (67-100%) Fascia Exposed: No Necrotic Quality: Eschar Fat Layer (Subcutaneous Tissue) Exposed: Yes Tendon Exposed: No Muscle Exposed: No Joint Exposed: No Bone Exposed: No Treatment Notes Wound #8 (Left Toe Third) 1. Cleanse With Wound Cleanser 3. Primary Dressing Applied Calcium Alginate Ag 4. Secondary Dressing Dry Gauze 5. Secured With Recruitment consultant) Signed: 03/15/2020 6:40:41 PM By: [name] RN, BSN Entered By: [name] on 03/15/2020 10:58:41 -------------------------------------------------------------------------------- [place] Details Patient Name: Date of Service: [name], [place] [name] 03/15/2020 9:45 A M Medical Record Number: [id number] Patient Account Number: [id number] Date of Birth/Sex: Treating RN: 03-07-1951 (69 y.o. [name] Primary Care Glenn Christo: [place], [name] Other Clinician: Referring Serenidy Waltz: Treating Laberta Wilbon/Extender: [name] RD, [name] Weeks in Treatment: 5 Vital Signs Time Taken: 10:38 Temperature (F): 98.0 Height  (in): 67 Pulse (bpm): 77 Weight (lbs): 202 Respiratory Rate (breaths/min): 18 Body Mass Index (BMI): 31.6 Blood Pressure (mmHg): 99/51 Reference Range: 80 - 120 mg / dl Electronic Signature(s) Signed: 03/16/2020 10:16:14 AM By: Sandre Kitty Entered By: Sandre Kitty on 03/15/2020 10:38:40

## 2020-03-18 DIAGNOSIS — N186 End stage renal disease: Secondary | ICD-10-CM | POA: Diagnosis not present

## 2020-03-18 DIAGNOSIS — D689 Coagulation defect, unspecified: Secondary | ICD-10-CM | POA: Diagnosis not present

## 2020-03-18 DIAGNOSIS — Z992 Dependence on renal dialysis: Secondary | ICD-10-CM | POA: Diagnosis not present

## 2020-03-18 DIAGNOSIS — D509 Iron deficiency anemia, unspecified: Secondary | ICD-10-CM | POA: Diagnosis not present

## 2020-03-18 DIAGNOSIS — N2581 Secondary hyperparathyroidism of renal origin: Secondary | ICD-10-CM | POA: Diagnosis not present

## 2020-03-18 DIAGNOSIS — D631 Anemia in chronic kidney disease: Secondary | ICD-10-CM | POA: Diagnosis not present

## 2020-03-18 NOTE — Progress Notes (Signed)
MAEDELL, HEDGER ([id number]) Visit Report for 03/08/2020 Arrival Information Details Patient Name: Date of Service: [name] [name], [name] 03/08/2020 3:45 PM Medical Record Number: [id number] Patient Account Number: [id number] Date of Birth/Sex: Treating RN: 02/23/1951 (69 y.o. [name] Primary Care Provider: Elio [name] RD, [name] Other Clinician: Referring Provider: Treating Provider/Extender: [name] RD, [name] Weeks in Treatment: 4 Visit Information History Since Last Visit Added or deleted any medications: No Patient Arrived: Wheel Chair Any new allergies or adverse reactions: No Arrival Time: 16:09 Had a fall or experienced change in No Accompanied By: self activities of daily living that may affect Transfer Assistance: None risk of falls: Patient Identification Verified: Yes Signs or symptoms of abuse/neglect since last visito No Secondary Verification Process Completed: Yes Hospitalized since last visit: No Patient Has Alerts: Yes Has Dressing in Place as Prescribed: Yes Patient Alerts: Right ABI: [place] Pain Present Now: No Left ABI: [place] Electronic Signature(s) Signed: 03/09/2020 1:30:39 PM By: Sandre Kitty Entered By: Sandre Kitty on 03/08/2020 16:09:59 -------------------------------------------------------------------------------- Compression Therapy Details Patient Name: Date of Service: [name], [name] 03/08/2020 3:45 PM Medical Record Number: [id number] Patient Account Number: [id number] Date of Birth/Sex: Treating RN: 02/19/1951 (70 y.o. [name] Primary Care Provider: Elio [name] RD, [name] Other Clinician: Referring Provider: Treating Provider/Extender: [name] RD, [name] Weeks in Treatment: 4 Compression Therapy Performed for Wound Assessment: Wound #10 Left,Medial Lower Leg Performed By: Clinician [name], RN Compression Type: Three Layer Post Procedure Diagnosis Same as Pre-procedure Electronic  Signature(s) Signed: 03/08/2020 5:37:29 PM By: [name] RN, BSN Entered By: [name] on 03/08/2020 16:55:40 -------------------------------------------------------------------------------- Compression Therapy Details Patient Name: Date of Service: [name] [name], [place] [name] 03/08/2020 3:45 PM Medical Record Number: [id number] Patient Account Number: [id number] Date of Birth/Sex: Treating RN: [birth year] (69 y.o. [name] Primary Care Provider: Elio [name] RD, [name] Other Clinician: Referring Provider: Treating Provider/Extender: [name] RD, [name] Weeks in Treatment: 4 Compression Therapy Performed for Wound Assessment: Wound #3R Right,Medial Malleolus Performed By: [name], RN Compression Type: Three Layer Post Procedure Diagnosis Same as Pre-procedure Electronic Signature(s) Signed: 03/08/2020 5:37:29 PM By: [name] RN, BSN Entered By: [name] on 03/08/2020 16:55:40 -------------------------------------------------------------------------------- Encounter Discharge Information Details Patient Name: Date of Service: [name], [name] 03/08/2020 3:45 PM Medical Record Number: [id number] Patient Account Number: [id number] Date of Birth/Sex: Treating RN: 02-Oct-1950 (69 y.o. [name] Primary Care Provider: Elio [name] RD, [name] Other Clinician: Referring Provider: Treating Provider/Extender: [name] RD, [name] Weeks in Treatment: 4 Encounter Discharge Information Items Discharge Condition: Stable Ambulatory Status: Wheelchair Discharge Destination: Home Transportation: Private Auto Accompanied By: self Schedule Follow-up Appointment: Yes Clinical Summary of Care: Patient Declined Electronic Signature(s) Signed: 03/17/2020 5:50:16 PM By: [name] RN Entered By: [name] on 03/08/2020 17:20:50 -------------------------------------------------------------------------------- Lower Extremity  Assessment Details Patient Name: Date of Service: [name], [name] 03/08/2020 3:45 PM Medical Record Number: [id number] Patient Account Number: [id number] Date of Birth/Sex: Treating RN: Nov 08, 1950 (69 y.o. Clearnce Sorrel Primary Care Provider: Elio [name] RD, [name] Other Clinician: Referring Provider: Treating Provider/Extender: [name] RD, [name] Weeks in Treatment: 4 Edema Assessment Assessed: [Left: No] [Right: No] Edema: [Left: Yes] [Right: Yes] Calf Left: Right: Point of Measurement: 38 cm From Medial Instep 39.5 cm 39.5 cm Ankle Left: Right: Point of Measurement: 13 cm From Medial Instep 25 cm 26 cm  Vascular Assessment Pulses: Dorsalis Pedis Palpable: [Left:No] [Right:No] Electronic Signature(s) Signed: 03/08/2020 5:22:02 PM By: Kela Millin Entered By: Kela Millin on 03/08/2020 16:34:41 -------------------------------------------------------------------------------- Multi-Disciplinary Care Plan Details Patient Name: Date of Service: [name], [name] 03/08/2020 3:45 PM Medical Record Number: [id number] Patient Account Number: [id number] Date of Birth/Sex: Treating RN: January 15, 1951 (69 y.o. [name] Primary Care Provider: Elio [name] RD, [name] Other Clinician: Referring Provider: Treating Provider/Extender: [name] RD, [name] Weeks in Treatment: 4 Active Inactive Venous Leg Ulcer Nursing Diagnoses: Knowledge deficit related to disease process and management Potential for venous Insuffiency (use before diagnosis confirmed) Goals: Patient will maintain optimal edema control Date Initiated: 02/09/2020 Target Resolution Date: 04/05/2020 Goal Status: Active Patient/caregiver will verbalize understanding of disease process and disease management Date Initiated: 02/09/2020 Date Inactivated: 03/02/2020 Target Resolution Date: 03/08/2020 Goal Status: Met Interventions: Assess peripheral edema status every  visit. Compression as ordered Provide education on venous insufficiency Treatment Activities: Therapeutic compression applied : 02/09/2020 Notes: Wound/Skin Impairment Nursing Diagnoses: Impaired tissue integrity Knowledge deficit related to ulceration/compromised skin integrity Goals: Patient/caregiver will verbalize understanding of skin care regimen Date Initiated: 02/09/2020 Target Resolution Date: 04/05/2020 Goal Status: Active Ulcer/skin breakdown will have a volume reduction of 30% by week 4 Date Initiated: 02/09/2020 Date Inactivated: 03/02/2020 Target Resolution Date: 03/08/2020 Goal Status: Met Interventions: Assess patient/caregiver ability to obtain necessary supplies Assess patient/caregiver ability to perform ulcer/skin care regimen upon admission and as needed Assess ulceration(s) every visit Provide education on ulcer and skin care Treatment Activities: Skin care regimen initiated : 02/09/2020 Topical wound management initiated : 02/09/2020 Notes: Electronic Signature(s) Signed: 03/08/2020 5:37:29 PM By: [name] RN, BSN Entered By: [name] on 03/08/2020 16:51:32 -------------------------------------------------------------------------------- Pain Assessment Details Patient Name: Date of Service: [name], [place] [name] 03/08/2020 3:45 PM Medical Record Number: [id number] Patient Account Number: [id number] Date of Birth/Sex: Treating RN: [birth year] (69 y.o. [name] Primary Care Provider: [place], [name] Other Clinician: Referring Provider: Treating Provider/Extender: [name] RD, [name] Weeks in Treatment: 4 Active Problems Location of Pain Severity and Description of Pain Patient Has Paino No Site Locations Pain Management and Medication Current Pain Management: Electronic Signature(s) Signed: 03/08/2020 5:37:29 PM By: [name] RN, BSN Signed: 03/09/2020 1:30:39 PM By: Sandre Kitty Entered By: Sandre Kitty  on 03/08/2020 16:10:23 -------------------------------------------------------------------------------- Patient/Caregiver Education Details Patient Name: Date of Service: [facility] [name], [place] [name] 8/18/2021andnbsp3:45 PM Medical Record Number: [id number] Patient Account Number: [id number] Date of Birth/Gender: Treating RN: 21-Feb-1951 (69 y.o. [name] Primary Care Physician: [place], [name] Other Clinician: Referring Physician: Treating Physician/Extender: [name] RD, [name] Weeks in Treatment: 4 Education Assessment Education Provided To: Patient Education Topics Provided Venous: Methods: Explain/Verbal Responses: Reinforcements needed, State content correctly Wound/Skin Impairment: Methods: Explain/Verbal Responses: Reinforcements needed, State content correctly Electronic Signature(s) Signed: 03/08/2020 5:37:29 PM By: [name] RN, BSN Entered By: [name] on 03/08/2020 16:52:04 -------------------------------------------------------------------------------- Wound Assessment Details Patient Name: Date of Service: [name], [place] [name] 03/08/2020 3:45 PM Medical Record Number: [id number] Patient Account Number: [id number] Date of Birth/Sex: Treating RN: [birth year] (69 y.o. [name] Primary Care Provider: Elio [name] RD, [name] Other Clinician: Referring Provider: Treating Provider/Extender: [name] RD, [name] Weeks in Treatment: 4 Wound Status Wound Number: 10 Primary Diabetic Wound/Ulcer of the Lower Extremity Etiology: Wound Location: Left, Medial Lower Leg Wound Open Wounding Event: Gradually Appeared Status: Date Acquired: 03/02/2020 Comorbid Cataracts,  Anemia, Asthma, Hypertension, Type II Diabetes, End Weeks Of Treatment: 0 History: Stage Renal Disease, Osteomyelitis, Neuropathy Clustered Wound: No Photos Photo Uploaded By: Mikeal Hawthorne on 03/09/2020 11:00:51 Wound Measurements Length: (cm) 0.1 Width: (cm)  0.1 Depth: (cm) 0.1 Area: (cm) 0.008 Volume: (cm) 0.001 Wound Description Classification: Grade 2 Wound Margin: Distinct, outline attached Exudate Amount: None Present Foul Odor After Cleansing: Slough/Fibrino % Reduction in Area: 94.3% % Reduction in Volume: 92.9% Epithelialization: Large (67-100%) Tunneling: No Undermining: No No No Wound Bed Granulation Amount: None Present (0%) Exposed Structure Necrotic Amount: None Present (0%) Fascia Exposed: No Fat Layer (Subcutaneous Tissue) Exposed: No Tendon Exposed: No Muscle Exposed: No Joint Exposed: No Bone Exposed: No Electronic Signature(s) Signed: 03/08/2020 5:37:29 PM By: [name] RN, BSN Entered By: [name] on 03/08/2020 16:55:00 -------------------------------------------------------------------------------- Wound Assessment Details Patient Name: Date of Service: [name], [employer] [name] 03/08/2020 3:45 PM Medical Record Number: [id number] Patient Account Number: [id number] Date of Birth/Sex: Treating RN: 05-Mar-1951 (69 y.o. [name] Primary Care Provider: Elio [name] RD, [name] Other Clinician: Referring Provider: Treating Provider/Extender: [name] RD, [name] Weeks in Treatment: 4 Wound Status Wound Number: 11 Primary Diabetic Wound/Ulcer of the Lower Extremity Etiology: Wound Location: Right, Proximal, Medial Lower Leg Wound Open Wounding Event: Blister Status: Date Acquired: 03/08/2020 Comorbid Cataracts, Anemia, Asthma, Hypertension, Type II Diabetes, End Weeks Of Treatment: 0 History: Stage Renal Disease, Osteomyelitis, Neuropathy Clustered Wound: Yes Photos Photo Uploaded By: Mikeal Hawthorne on 03/09/2020 11:09:31 Wound Measurements Length: (cm) 2.5 Width: (cm) 1.8 Depth: (cm) 0.1 Clustered Quantity: 3 Area: (cm) 3.534 Volume: (cm) 0.353 % Reduction in Area: 0% % Reduction in Volume: 0% Epithelialization: None Tunneling: No Undermining: No Wound  Description Classification: Grade 2 Wound Margin: Distinct, outline attached Exudate Amount: Small Exudate Type: Serosanguineous Exudate Color: red, brown Wound Bed Granulation Amount: Medium (34-66%) Granulation Quality: Pink Necrotic Amount: Medium (34-66%) Necrotic Quality: Eschar, Adherent Slough Foul Odor After Cleansing: No Slough/Fibrino Yes Exposed Structure Fascia Exposed: No Fat Layer (Subcutaneous Tissue) Exposed: Yes Tendon Exposed: No Muscle Exposed: No Joint Exposed: No Bone Exposed: No Electronic Signature(s) Signed: 03/08/2020 5:22:02 PM By: Kela Millin Signed: 03/08/2020 5:37:29 PM By: [name] RN, BSN Entered By: Kela Millin on 03/08/2020 16:44:55 -------------------------------------------------------------------------------- Wound Assessment Details Patient Name: Date of Service: [name], [place] [name] 03/08/2020 3:45 PM Medical Record Number: [id number] Patient Account Number: [id number] Date of Birth/Sex: Treating RN: [birth year] (69 y.o. [name] Primary Care Provider: Elio [name] RD, [name] Other Clinician: Referring Provider: Treating Provider/Extender: [name] RD, [name] Weeks in Treatment: 4 Wound Status Wound Number: 12 Primary Diabetic Wound/Ulcer of the Lower Extremity Etiology: Wound Location: Right, Distal, Medial Lower Leg Wound Open Wounding Event: Blister Status: Date Acquired: 03/08/2020 Comorbid Cataracts, Anemia, Asthma, Hypertension, Type II Diabetes, End Weeks Of Treatment: 0 History: Stage Renal Disease, Osteomyelitis, Neuropathy Clustered Wound: No Photos Photo Uploaded By: Mikeal Hawthorne on 03/09/2020 11:09:31 Wound Measurements Length: (cm) 1.4 Width: (cm) 2.3 Depth: (cm) 0.1 Area: (cm) 2.529 Volume: (cm) 0.253 % Reduction in Area: 0% % Reduction in Volume: 0% Epithelialization: None Tunneling: No Undermining: No Wound Description Classification: Grade 2 Wound Margin: Distinct,  outline attached Exudate Amount: Small Exudate Type: Serosanguineous Exudate Color: red, brown Foul Odor After Cleansing: No Slough/Fibrino Yes Wound Bed Granulation Amount: Medium (34-66%) Exposed Structure Granulation Quality: Pink Fascia Exposed: No Necrotic Amount: Medium (34-66%) Fat Layer (Subcutaneous Tissue) Exposed: Yes Necrotic Quality: Eschar, Adherent Slough Tendon Exposed:  No Muscle Exposed: No Joint Exposed: No Bone Exposed: No Electronic Signature(s) Signed: 03/08/2020 5:22:02 PM By: Kela Millin Signed: 03/08/2020 5:37:29 PM By: [name] RN, BSN Entered By: Kela Millin on 03/08/2020 16:43:41 -------------------------------------------------------------------------------- Wound Assessment Details Patient Name: Date of Service: [name] [name], [place] [name] 03/08/2020 3:45 PM Medical Record Number: [id number] Patient Account Number: [id number] Date of Birth/Sex: Treating RN: 1951/02/03 (69 y.o. [name] Primary Care Provider: Elio [name] RD, [name] Other Clinician: Referring Provider: Treating Provider/Extender: [name] RD, [name] Weeks in Treatment: 4 Wound Status Wound Number: 3R Primary Diabetic Wound/Ulcer of the Lower Extremity Etiology: Wound Location: Right, Medial Malleolus Wound Open Wounding Event: Gradually Appeared Status: Date Acquired: 10/21/2019 Comorbid Cataracts, Anemia, Asthma, Hypertension, Type II Diabetes, End Weeks Of Treatment: 4 History: Stage Renal Disease, Osteomyelitis, Neuropathy Clustered Wound: Yes Photos Photo Uploaded By: Mikeal Hawthorne on 03/09/2020 11:00:51 Wound Measurements Length: (cm) 2.9 Width: (cm) 4 Depth: (cm) 0.1 Clustered Quantity: 4 Area: (cm) 9.111 Volume: (cm) 0.911 % Reduction in Area: -43.2% % Reduction in Volume: -43.2% Epithelialization: None Tunneling: No Undermining: No Wound Description Classification: Grade 2 Wound Margin: Distinct, outline attached Exudate  Amount: Small Exudate Type: Serosanguineous Exudate Color: red, brown Foul Odor After Cleansing: No Slough/Fibrino Yes Wound Bed Granulation Amount: Medium (34-66%) Exposed Structure Granulation Quality: Pink Fascia Exposed: No Necrotic Amount: Medium (34-66%) Fat Layer (Subcutaneous Tissue) Exposed: Yes Necrotic Quality: Eschar, Adherent Slough Tendon Exposed: No Muscle Exposed: No Joint Exposed: No Bone Exposed: No Electronic Signature(s) Signed: 03/08/2020 5:22:02 PM By: Kela Millin Signed: 03/08/2020 5:37:29 PM By: [name] RN, BSN Entered By: Kela Millin on 03/08/2020 16:44:32 -------------------------------------------------------------------------------- Wound Assessment Details Patient Name: Date of Service: [name], [place] [name] 03/08/2020 3:45 PM Medical Record Number: [id number] Patient Account Number: [id number] Date of Birth/Sex: Treating RN: March 07, 1951 (69 y.o. [name] Primary Care Provider: Elio [name] RD, [name] Other Clinician: Referring Provider: Treating Provider/Extender: [name] RD, [name] Weeks in Treatment: 4 Wound Status Wound Number: 4R Primary Diabetic Wound/Ulcer of the Lower Extremity Etiology: Wound Location: Right, Medial Lower Leg Wound Open Wounding Event: Gradually Appeared Status: Date Acquired: 10/21/2019 Comorbid Cataracts, Anemia, Asthma, Hypertension, Type II Diabetes, End Weeks Of Treatment: 4 History: Stage Renal Disease, Osteomyelitis, Neuropathy Clustered Wound: Yes Photos Photo Uploaded By: Mikeal Hawthorne on 03/09/2020 11:09:07 Wound Measurements Length: (cm) 0.8 Width: (cm) 1.5 Depth: (cm) 0.1 Clustered Quantity: 3 Area: (cm) 0.942 Volume: (cm) 0.094 % Reduction in Area: 91.1% % Reduction in Volume: 95.6% Epithelialization: None Tunneling: No Undermining: No Wound Description Classification: Grade 2 Wound Margin: Distinct, outline attached Exudate Amount: Small Exudate Type:  Serosanguineous Exudate Color: red, brown Foul Odor After Cleansing: No Slough/Fibrino Yes Wound Bed Granulation Amount: Medium (34-66%) Exposed Structure Granulation Quality: Pink Fascia Exposed: No Necrotic Amount: Medium (34-66%) Fat Layer (Subcutaneous Tissue) Exposed: Yes Necrotic Quality: Eschar, Adherent Slough Tendon Exposed: No Muscle Exposed: No Joint Exposed: No Bone Exposed: No Electronic Signature(s) Signed: 03/08/2020 5:22:02 PM By: Kela Millin Signed: 03/08/2020 5:37:29 PM By: [name] RN, BSN Entered By: Kela Millin on 03/08/2020 16:43:02 -------------------------------------------------------------------------------- Wound Assessment Details Patient Name: Date of Service: [name] [name], [place] [name] 03/08/2020 3:45 PM Medical Record Number: [id number] Patient Account Number: [id number] Date of Birth/Sex: Treating RN: 1950-11-05 (69 y.o. [name] Primary Care Provider: Elio [name] RD, [name] Other Clinician: Referring Provider: Treating Provider/Extender: [name] RD, [name] Weeks in Treatment: 4 Wound Status Wound Number: 5R Primary  Diabetic Wound/Ulcer of the Lower Extremity Etiology: Wound Location: Right, Anterior Lower Leg Wound Open Wounding Event: Gradually Appeared Status: Date Acquired: 10/21/2019 Comorbid Cataracts, Anemia, Asthma, Hypertension, Type II Diabetes, End Weeks Of Treatment: 4 History: Stage Renal Disease, Osteomyelitis, Neuropathy Clustered Wound: Yes Photos Photo Uploaded By: Mikeal Hawthorne on 03/09/2020 11:09:08 Wound Measurements Length: (cm) 7.2 Width: (cm) 2 Depth: (cm) 0.1 Clustered Quantity: 5 Area: (cm) 11.31 Volume: (cm) 1.131 % Reduction in Area: 45.1% % Reduction in Volume: 45.2% Epithelialization: None Tunneling: No Undermining: No Wound Description Classification: Grade 2 Wound Margin: Distinct, outline attached Exudate Amount: Small Exudate Type: Serosanguineous Exudate Color:  red, brown Foul Odor After Cleansing: No Slough/Fibrino Yes Wound Bed Granulation Amount: Medium (34-66%) Exposed Structure Granulation Quality: Pink Fascia Exposed: No Necrotic Amount: Medium (34-66%) Fat Layer (Subcutaneous Tissue) Exposed: Yes Necrotic Quality: Eschar, Adherent Slough Tendon Exposed: No Muscle Exposed: No Joint Exposed: No Bone Exposed: No Electronic Signature(s) Signed: 03/08/2020 5:22:02 PM By: Kela Millin Signed: 03/08/2020 5:37:29 PM By: [name] RN, BSN Entered By: Kela Millin on 03/08/2020 16:43:53 -------------------------------------------------------------------------------- Wound Assessment Details Patient Name: Date of Service: [name], [place] [name] 03/08/2020 3:45 PM Medical Record Number: [id number] Patient Account Number: [id number] Date of Birth/Sex: Treating RN: 04-09-1951 (69 y.o. [name] Primary Care Provider: Elio [name] RD, [name] Other Clinician: Referring Provider: Treating Provider/Extender: [name] RD, [name] Weeks in Treatment: 4 Wound Status Wound Number: 8 Primary Diabetic Wound/Ulcer of the Lower Extremity Etiology: Wound Location: Left T Third oe Wound Open Wounding Event: Blister Status: Date Acquired: 02/23/2020 Comorbid Cataracts, Anemia, Asthma, Hypertension, Type II Diabetes, End Weeks Of Treatment: 2 History: Stage Renal Disease, Osteomyelitis, Neuropathy Clustered Wound: No Photos Photo Uploaded By: Mikeal Hawthorne on 03/09/2020 11:00:12 Wound Measurements Length: (cm) 0.1 Width: (cm) 0.1 Depth: (cm) 0.1 Area: (cm) 0.008 Volume: (cm) 0.001 % Reduction in Area: 94.3% % Reduction in Volume: 92.9% Epithelialization: Medium (34-66%) Tunneling: No Undermining: No Wound Description Classification: Grade 2 Wound Margin: Flat and Intact Exudate Amount: Small Exudate Type: Serous Exudate Color: amber Foul Odor After Cleansing: No Slough/Fibrino No Wound Bed Granulation  Amount: Small (1-33%) Exposed Structure Granulation Quality: Pink Fascia Exposed: No Necrotic Amount: Large (67-100%) Fat Layer (Subcutaneous Tissue) Exposed: No Necrotic Quality: Eschar Tendon Exposed: No Muscle Exposed: No Joint Exposed: No Bone Exposed: No Electronic Signature(s) Signed: 03/08/2020 5:22:02 PM By: Kela Millin Signed: 03/08/2020 5:37:29 PM By: [name] RN, BSN Entered By: Kela Millin on 03/08/2020 16:37:09 -------------------------------------------------------------------------------- Wound Assessment Details Patient Name: Date of Service: [facility] [name], [place] [name] 03/08/2020 3:45 PM Medical Record Number: [id number] Patient Account Number: [id number] Date of Birth/Sex: Treating RN: May 23, 1951 (69 y.o. [name] Primary Care Provider: Elio [name] RD, [name] Other Clinician: Referring Provider: Treating Provider/Extender: [name] RD, [name] Weeks in Treatment: 4 Wound Status Wound Number: 9 Primary Diabetic Wound/Ulcer of the Lower Extremity Etiology: Wound Location: Left, Anterior Lower Leg Wound Healed - Epithelialized Wounding Event: Gradually Appeared Status: Date Acquired: 03/02/2020 Comorbid Cataracts, Anemia, Asthma, Hypertension, Type II Diabetes, End Weeks Of Treatment: 0 History: Stage Renal Disease, Osteomyelitis, Neuropathy Clustered Wound: Yes Photos Photo Uploaded By: Mikeal Hawthorne on 03/09/2020 11:00:15 Wound Measurements Length: (cm) Width: (cm) Depth: (cm) Area: (cm) Volume: (cm) 0 % Reduction in Area: 100% 0 % Reduction in Volume: 100% 0 Epithelialization: Large (67-100%) 0 Tunneling: No 0 Undermining: No Wound Description Classification: Grade 2 Wound Margin: Distinct, outline attached Exudate Amount: None Present Foul Odor  After Cleansing: No Slough/Fibrino No Wound Bed Granulation Amount: None Present (0%) Exposed Structure Necrotic Amount: None Present (0%) Fascia Exposed: No Fat Layer  (Subcutaneous Tissue) Exposed: No Tendon Exposed: No Muscle Exposed: No Joint Exposed: No Bone Exposed: No Electronic Signature(s) Signed: 03/08/2020 5:22:02 PM By: Kela Millin Signed: 03/08/2020 5:37:29 PM By: [name] RN, BSN Entered By: Kela Millin on 03/08/2020 16:35:35 -------------------------------------------------------------------------------- [place] Details Patient Name: Date of Service: [name], [place] [name] 03/08/2020 3:45 PM Medical Record Number: [id number] Patient Account Number: [id number] Date of Birth/Sex: Treating RN: Nov 13, 1950 (69 y.o. [name] Primary Care Provider: [place], [name] Other Clinician: Referring Provider: Treating Provider/Extender: [name] RD, [name] Weeks in Treatment: 4 Vital Signs Time Taken: 16:10 Temperature (F): 98.3 Height (in): 67 Pulse (bpm): 86 Weight (lbs): 202 Respiratory Rate (breaths/min): 18 Body Mass Index (BMI): 31.6 Blood Pressure (mmHg): 108/70 Reference Range: 80 - 120 mg / dl Electronic Signature(s) Signed: 03/09/2020 1:30:39 PM By: Sandre Kitty Entered By: Sandre Kitty on 03/08/2020 16:10:17

## 2020-03-21 DIAGNOSIS — N2581 Secondary hyperparathyroidism of renal origin: Secondary | ICD-10-CM | POA: Diagnosis not present

## 2020-03-21 DIAGNOSIS — I129 Hypertensive chronic kidney disease with stage 1 through stage 4 chronic kidney disease, or unspecified chronic kidney disease: Secondary | ICD-10-CM | POA: Diagnosis not present

## 2020-03-21 DIAGNOSIS — D509 Iron deficiency anemia, unspecified: Secondary | ICD-10-CM | POA: Diagnosis not present

## 2020-03-21 DIAGNOSIS — N186 End stage renal disease: Secondary | ICD-10-CM | POA: Diagnosis not present

## 2020-03-21 DIAGNOSIS — D631 Anemia in chronic kidney disease: Secondary | ICD-10-CM | POA: Diagnosis not present

## 2020-03-21 DIAGNOSIS — Z992 Dependence on renal dialysis: Secondary | ICD-10-CM | POA: Diagnosis not present

## 2020-03-21 DIAGNOSIS — D689 Coagulation defect, unspecified: Secondary | ICD-10-CM | POA: Diagnosis not present

## 2020-03-22 ENCOUNTER — Other Ambulatory Visit: Payer: Self-pay

## 2020-03-22 ENCOUNTER — Encounter (HOSPITAL_BASED_OUTPATIENT_CLINIC_OR_DEPARTMENT_OTHER): Payer: Medicare Other | Attending: Physician Assistant | Admitting: Physician Assistant

## 2020-03-22 DIAGNOSIS — L97812 Non-pressure chronic ulcer of other part of right lower leg with fat layer exposed: Secondary | ICD-10-CM | POA: Insufficient documentation

## 2020-03-22 DIAGNOSIS — L97312 Non-pressure chronic ulcer of right ankle with fat layer exposed: Secondary | ICD-10-CM | POA: Diagnosis not present

## 2020-03-22 DIAGNOSIS — Z992 Dependence on renal dialysis: Secondary | ICD-10-CM | POA: Diagnosis not present

## 2020-03-22 DIAGNOSIS — L97822 Non-pressure chronic ulcer of other part of left lower leg with fat layer exposed: Secondary | ICD-10-CM | POA: Diagnosis not present

## 2020-03-22 DIAGNOSIS — I120 Hypertensive chronic kidney disease with stage 5 chronic kidney disease or end stage renal disease: Secondary | ICD-10-CM | POA: Diagnosis not present

## 2020-03-22 DIAGNOSIS — I872 Venous insufficiency (chronic) (peripheral): Secondary | ICD-10-CM | POA: Diagnosis not present

## 2020-03-22 DIAGNOSIS — E11622 Type 2 diabetes mellitus with other skin ulcer: Secondary | ICD-10-CM | POA: Diagnosis not present

## 2020-03-22 DIAGNOSIS — E11621 Type 2 diabetes mellitus with foot ulcer: Secondary | ICD-10-CM | POA: Insufficient documentation

## 2020-03-22 DIAGNOSIS — E1122 Type 2 diabetes mellitus with diabetic chronic kidney disease: Secondary | ICD-10-CM | POA: Insufficient documentation

## 2020-03-22 DIAGNOSIS — L97522 Non-pressure chronic ulcer of other part of left foot with fat layer exposed: Secondary | ICD-10-CM | POA: Diagnosis not present

## 2020-03-22 DIAGNOSIS — L97512 Non-pressure chronic ulcer of other part of right foot with fat layer exposed: Secondary | ICD-10-CM | POA: Insufficient documentation

## 2020-03-22 DIAGNOSIS — E1140 Type 2 diabetes mellitus with diabetic neuropathy, unspecified: Secondary | ICD-10-CM | POA: Insufficient documentation

## 2020-03-22 DIAGNOSIS — N186 End stage renal disease: Secondary | ICD-10-CM | POA: Diagnosis not present

## 2020-03-22 NOTE — Progress Notes (Addendum)
ROSHANDA, BALAZS ([id number]) Visit Report for 03/22/2020 Chief Complaint Document Details Patient Name: Date of Service: [name], [name] 03/22/2020 11:00 A M Medical Record Number: [id number] Patient Account Number: [id number] Date of Birth/Sex: Treating RN: 10-11-1950 (69 y.o. [name] Primary Care Provider: [place], [name] Other Clinician: Referring Provider: Treating Provider/Extender: [name] RD, [name] Weeks in Treatment: 6 Information Obtained from: Patient Chief Complaint Multiple LE Ulcers Electronic Signature(s) Signed: 03/22/2020 1:26:09 PM By: [name] PA-C Entered By: [name] on 03/22/2020 13:26:09 -------------------------------------------------------------------------------- HPI Details Patient Name: Date of Service: [facility] [name], [place] [name] 03/22/2020 11:00 A M Medical Record Number: [id number] Patient Account Number: [id number] Date of Birth/Sex: Treating RN: 1951/03/27 (69 y.o. [name] Primary Care Provider: [name] RD, [name] Other Clinician: Referring Provider: Treating Provider/Extender: [name] RD, [name] Weeks in Treatment: 6 History of Present Illness HPI Description: Evaluate7/21/2021 today patient presents for evaluation of ulcerations on her legs which she tells me tend to come and go as far as small blistering locations and sometimes are better than other times. Right now she tells me that this is actually doing a little bit better but nonetheless will not completely go away. She has ulcerations bilaterally on her lower extremities. The right is worse than the left. 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 The patient has no evidence of active infection at this time. She however appears to have proficient blood flow into the extremities and I think would tolerate a 3 layer compression wrap  she was noncompressible on arterial studies. Nonetheless there was no obvious signs of occlusion. 02/16/2020 on evaluation today patient appears to be doing much better in regard to her wounds in general. On the past week she has made significant improvements which is great news there is no signs of active infection at this time. No fevers, chills, nausea, vomiting, or diarrhea. 02/23/2020 on evaluation today patient appears to be doing well in regard to her lower extremity ulcers and ankle ulcers. Overall I feel like she is making great progress and in general I am extremely pleased with how things stand. There is no sign of active infection at this time which is also good news. She will require slight debridement around the right ankle region. 03/02/20-Patient returns at 1 week, has few new areas open on the left leg, especially 1 healed area on the medial ankle that has slight small open area now, to anterior leg areas that started out as blisters that are open now. She was using juxta lite compression to the left, she used Band-Aid to cover the vesicle that had ruptured on the left anterior leg, we are using 3 layer compression on the right. We are using silver alginate to the toe wound on the left. 03/08/2020 upon evaluation today patient unfortunately is doing much worse in regard to her right leg although her left leg looks like is doing better. Again we have taken her compression wraps off last week since she was doing well and transitioned her to her Velcro compression wrap. However apparently the patient is stating this was not put on here in the clinic and she never put it on at home. With that being said unfortunately she developed swelling and opened up new wounds which are present today she has quite a few of them in fact. 03/15/2020  on evaluation today patient appears to be doing well with regard to her wounds on the lower extremities bilaterally. Fortunately there is no signs of active  infection at this time. No fever chills noted. Overall I am actually very pleased with where things stand currently. 03/22/2020 on evaluation today patient appears to be doing fairly well in regard to her wounds in general. She just has really one area left on her toe everything else is healed on her legs. She does have her Velcro compression wraps which I think are appropriate at this point. Fortunately there is no signs of active infection at this time. No fevers, chills, nausea, vomiting, or diarrhea. Electronic Signature(s) Signed: 03/22/2020 1:26:17 PM By: [name] PA-C Entered By: [name] on 03/22/2020 13:26:17 -------------------------------------------------------------------------------- Physical Exam Details Patient Name: Date of Service: [facility] [name], [name] 03/22/2020 11:00 A M Medical Record Number: [id number] Patient Account Number: [id number] Date of Birth/Sex: Treating RN: 01-05-1951 (69 y.o. [name] Primary Care Provider: [name] RD, [name] Other Clinician: Referring Provider: Treating Provider/Extender: [name] RD, [name] Weeks in Treatment: 6 Constitutional Well-nourished and well-hydrated in no acute distress. Respiratory normal breathing without difficulty. Psychiatric this patient is able to make decisions and demonstrates good insight into disease process. Alert and Oriented x 3. pleasant and cooperative. Notes Upon inspection patient actually appears to be doing great except for her left third toe which has an opening from where it rubbed on her shoe. Obviously I think when she gets her diabetic shoes things will improve quite a bit as well she may go try those on later today. The sooner the better in my opinion as far as helping with her issues are concerned. Electronic Signature(s) Signed: 03/22/2020 1:52:39 PM By: [name] PA-C Entered By: [name] on 03/22/2020  13:52:39 -------------------------------------------------------------------------------- Physician Orders Details Patient Name: Date of Service: [facility] [name], [place] [name] 03/22/2020 11:00 A M Medical Record Number: [id number] Patient Account Number: [id number] Date of Birth/Sex: Treating RN: June 16, 1951 (69 y.o. [name] Primary Care Provider: [name] RD, [name] Other Clinician: Referring Provider: Treating Provider/Extender: [name] RD, [name] Weeks in Treatment: 6 Verbal / Phone Orders: No Diagnosis Coding ICD-10 Coding Code Description I87.2 Venous insufficiency (chronic) (peripheral) E11.621 Type 2 diabetes mellitus with foot ulcer L97.822 Non-pressure chronic ulcer of other part of left lower leg with fat layer exposed L97.312 Non-pressure chronic ulcer of right ankle with fat layer exposed L97.812 Non-pressure chronic ulcer of other part of right lower leg with fat layer exposed L97.512 Non-pressure chronic ulcer of other part of right foot with fat layer exposed N18.6 End stage renal disease Z99.2 Dependence on renal dialysis I10 Essential (primary) hypertension Follow-up Appointments Return Appointment in 1 week. Dressing Change Frequency Wound #8 Left T Third oe Change Dressing every other day. Skin Barriers/Peri-Wound Care Moisturizing lotion - both legs nightly Wound Cleansing Wound #8 Left T Third oe May shower and wash wound with soap and water. Primary Wound Dressing Wound #8 Left T Third oe Silver Collagen - moisten with hydrogel or KY gel Secondary Dressing Wound #8 Left T Third oe Other: - bandaid or rolled gauze to secure Edema Control Avoid standing for long periods of time Elevate legs to the level of the heart or above for 30 minutes daily and/or when sitting, a frequency of: - throughout the day Exercise regularly Support Garment 20-30 mm/Hg pressure to: - juxtalite to both legs daily,  apply in morning and remove at night Electronic  Signature(s) Signed: 03/22/2020 6:44:49 PM By: [name] RN, BSN Signed: 03/25/2020 11:53:24 AM By: [name] PA-C Entered By: [name] on 03/22/2020 12:10:48 -------------------------------------------------------------------------------- Problem List Details Patient Name: Date of Service: [facility] [name], [place] [name] 03/22/2020 11:00 A M Medical Record Number: [id number] Patient Account Number: [id number] Date of Birth/Sex: Treating RN: 05-05-1951 (69 y.o. [name] Primary Care Provider: [name] RD, [name] Other Clinician: Referring Provider: Treating Provider/Extender: [name] RD, [name] Weeks in Treatment: 6 Active Problems ICD-10 Encounter Code Description Active Date MDM Diagnosis I87.2 Venous insufficiency (chronic) (peripheral) 02/09/2020 No Yes E11.621 Type 2 diabetes mellitus with foot ulcer 02/09/2020 No Yes L97.822 Non-pressure chronic ulcer of other part of left lower leg with fat layer exposed7/21/2021 No Yes L97.312 Non-pressure chronic ulcer of right ankle with fat layer exposed 02/09/2020 No Yes L97.812 Non-pressure chronic ulcer of other part of right lower leg with fat layer 02/09/2020 No Yes exposed L97.512 Non-pressure chronic ulcer of other part of right foot with fat layer exposed 02/09/2020 No Yes N18.6 End stage renal disease 02/09/2020 No Yes Z99.2 Dependence on renal dialysis 02/09/2020 No Yes I10 Essential (primary) hypertension 02/09/2020 No Yes Inactive Problems Resolved Problems Electronic Signature(s) Signed: 03/22/2020 11:53:19 AM By: [name] PA-C Entered By: [name] on 03/22/2020 11:53:18 -------------------------------------------------------------------------------- Progress Note Details Patient Name: Date of Service: [facility] [name], [place] [name] 03/22/2020 11:00 A M Medical Record Number: [id number] Patient Account Number: [id number] Date of Birth/Sex: Treating RN: [birth year] (69 y.o. [name] Primary Care  Provider: [name] RD, [name] Other Clinician: Referring Provider: Treating Provider/Extender: [name] RD, [name] Weeks in Treatment: 6 Subjective Chief Complaint Information obtained from Patient Multiple LE Ulcers History of Present Illness (HPI) Evaluate7/21/2021 today patient presents for evaluation of ulcerations on her legs which she tells me tend to come and go as far as small blistering locations and sometimes are better than other times. Right now she tells me that this is actually doing a little bit better but nonetheless will not completely go away. She has ulcerations bilaterally on her lower extremities. The right is worse than the left. 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 The patient has no evidence of active infection at this time. She however appears to have proficient blood flow into the extremities and I think would tolerate a 3 layer compression wrap she was noncompressible on arterial studies. Nonetheless there was no obvious signs of occlusion. 02/16/2020 on evaluation today patient appears to be doing much better in regard to her wounds in general. On the past week she has made significant improvements which is great news there is no signs of active infection at this time. No fevers, chills, nausea, vomiting, or diarrhea. 02/23/2020 on evaluation today patient appears to be doing well in regard to her lower extremity ulcers and ankle ulcers. Overall I feel like she is making great progress and in general I am extremely pleased with how things stand. There is no sign of active infection at this time which is also good news. She will require slight debridement around the right ankle region. 03/02/20-Patient returns at 1 week, has few new areas open on the left leg, especially 1 healed area on the medial ankle that has slight small open area now,  to anterior leg areas that  started out as blisters that are open now. She was using juxta lite compression to the left, she used Band-Aid to cover the vesicle that had ruptured on the left anterior leg, we are using 3 layer compression on the right. We are using silver alginate to the toe wound on the left. 03/08/2020 upon evaluation today patient unfortunately is doing much worse in regard to her right leg although her left leg looks like is doing better. Again we have taken her compression wraps off last week since she was doing well and transitioned her to her Velcro compression wrap. However apparently the patient is stating this was not put on here in the clinic and she never put it on at home. With that being said unfortunately she developed swelling and opened up new wounds which are present today she has quite a few of them in fact. 03/15/2020 on evaluation today patient appears to be doing well with regard to her wounds on the lower extremities bilaterally. Fortunately there is no signs of active infection at this time. No fever chills noted. Overall I am actually very pleased with where things stand currently. 03/22/2020 on evaluation today patient appears to be doing fairly well in regard to her wounds in general. She just has really one area left on her toe everything else is healed on her legs. She does have her Velcro compression wraps which I think are appropriate at this point. Fortunately there is no signs of active infection at this time. No fevers, chills, nausea, vomiting, or diarrhea. Objective Constitutional Well-nourished and well-hydrated in no acute distress. Vitals Time Taken: 11:48 AM, Height: 67 in, Weight: 202 lbs, BMI: 31.6, Temperature: 98.3 F, Pulse: 73 bpm, Respiratory Rate: 16 breaths/min, Blood Pressure: 103/70 mmHg. Respiratory normal breathing without difficulty. Psychiatric this patient is able to make decisions and demonstrates good insight into disease  process. Alert and Oriented x 3. pleasant and cooperative. General Notes: Upon inspection patient actually appears to be doing great except for her left third toe which has an opening from where it rubbed on her shoe. Obviously I think when she gets her diabetic shoes things will improve quite a bit as well she may go try those on later today. The sooner the better in my opinion as far as helping with her issues are concerned. Integumentary (Hair, Skin) Wound #12 status is Healed - Epithelialized. Original cause of wound was Blister. The wound is located on the Right,Distal,Medial Lower Leg. The wound measures 0cm length x 0cm width x 0cm depth; 0cm^2 area and 0cm^3 volume. There is Fat Layer (Subcutaneous Tissue) exposed. There is no tunneling or undermining noted. There is a small amount of serous drainage noted. The wound margin is distinct with the outline attached to the wound base. There is large (67-100%) red granulation within the wound bed. There is no necrotic tissue within the wound bed. Wound #3R status is Open. Original cause of wound was Gradually Appeared. The wound is located on the Right,Medial Malleolus. The wound measures 0cm length x 0cm width x 0cm depth; 0cm^2 area and 0cm^3 volume. There is Fat Layer (Subcutaneous Tissue) exposed. There is no tunneling or undermining noted. There is a small amount of serosanguineous drainage noted. The wound margin is distinct with the outline attached to the wound base. There is large (67- 100%) red granulation within the wound bed. There is no necrotic tissue within the wound bed. Wound #8 status is Open. Original cause of wound was Blister. The wound is  located on the Left T Third. The wound measures 1cm length x 0.8cm width x oe 0.1cm depth; 0.628cm^2 area and 0.063cm^3 volume. There is Fat Layer (Subcutaneous Tissue) exposed. There is no tunneling or undermining noted. There is a small amount of serous drainage noted. The wound margin is  flat and intact. There is no granulation within the wound bed. There is a large (67-100%) amount of necrotic tissue within the wound bed including Eschar. 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 Dressing Change Frequency: Wound #8 Left T Third: oe Change Dressing every other day. Skin Barriers/Peri-Wound Care: Moisturizing lotion - both legs nightly Wound Cleansing: Wound #8 Left T Third: oe May shower and wash wound with soap and water. Primary Wound Dressing: Wound #8 Left T Third: oe Silver Collagen - moisten with hydrogel or KY gel Secondary Dressing: Wound #8 Left T Third: oe Other: - bandaid or rolled gauze to secure Edema Control: Avoid standing for long periods of time Elevate legs to the level of the heart or above for 30 minutes daily and/or when sitting, a frequency of: - throughout the day Exercise regularly Support Garment 20-30 mm/Hg pressure to: - juxtalite to both legs daily, apply in morning and remove at night 1. I would recommend at this time that the patient actually use silver collagen to her toe this is really the only open wound remaining at this point. 2. We will also moisten this with hydrogel in order to keep it from drying out to use a Band-Aid over top. 3. I would also recommend that the patient actually go ahead and use her Velcro wraps bilaterally I think this is good to be the best option for her at this time as far as  keeping her edema under control ongoing and long-term. We will see patient back for reevaluation in 1 week here in the clinic. If anything worsens or changes patient will contact our office for additional recommendations. Electronic Signature(s) Signed: 03/22/2020 1:53:12 PM By: [name] PA-C Entered By: [name] on 03/22/2020 13:53:11 -------------------------------------------------------------------------------- SuperBill Details Patient Name: Date of Service: [name], [place] [name] 03/22/2020 Medical Record Number: [id number] Patient Account Number: [id number] Date of Birth/Sex: Treating RN: [birth year] (69 y.o. [name] Primary Care Provider: [name] RD, [name] Other Clinician: Referring Provider: Treating Provider/Extender: [name] RD, [name] Weeks in Treatment: 6 Diagnosis Coding ICD-10 Codes Code Description I87.2 Venous insufficiency (chronic) (peripheral) E11.621 Type 2 diabetes mellitus with foot ulcer L97.822 Non-pressure chronic ulcer of other part of left lower leg with fat layer exposed L97.312 Non-pressure chronic ulcer of right ankle with fat layer exposed L97.812 Non-pressure chronic ulcer of other part of right lower leg with fat layer exposed L97.512 Non-pressure chronic ulcer of other part of right foot with fat layer exposed N18.6 End stage renal disease Z99.2 Dependence on renal dialysis I10 Essential (primary) hypertension Facility Procedures CPT4 Code: [id number] Description: 19509 - WOUND CARE VISIT-LEV 3 EST PT Modifier: Quantity: 1 Physician Procedures : CPT4 Code Description Modifier [id number]  [id number] - WC PHYS LEVEL 3 - EST PT ICD-10 Diagnosis Description I87.2 Venous insufficiency (chronic) (peripheral) E11.621 Type 2 diabetes mellitus with foot ulcer L97.822 Non-pressure chronic ulcer of other part of  left lower leg with fat layer exposed L97.312 Non-pressure chronic ulcer of right ankle with fat layer exposed Quantity:  1 Electronic Signature(s) Signed: 03/22/2020 1:53:28 PM By: [name] PA-C Entered By: [name] on 03/22/2020 13:53:27

## 2020-03-22 NOTE — Progress Notes (Signed)
ROBEN, TATSCH ([id number]) Visit Report for 03/22/2020 Arrival Information Details Patient Name: Date of Service: [name], [name] 03/22/2020 11:00 A M Medical Record Number: [id number] Patient Account Number: [id number] Date of Birth/Sex: Treating RN: [birth year] (69 y.o. [name] Primary Care Jacob Cicero: [name] RD, [name] Other Clinician: Referring Evamaria Detore: Treating Shavonda Wiedman/Extender: [name] RD, [name] Weeks in Treatment: 6 Visit Information History Since Last Visit All ordered tests and consults were completed: No Patient Arrived: Wheel Chair Added or deleted any medications: No Arrival Time: 11:47 Any new allergies or adverse reactions: No Accompanied By: self Had a fall or experienced change in No Transfer Assistance: None activities of daily living that may affect Patient Identification Verified: Yes risk of falls: Secondary Verification Process Completed: Yes Signs or symptoms of abuse/neglect since last visito No Patient Has Alerts: Yes Hospitalized since last visit: No Patient Alerts: Right ABI: [place] Implantable device outside of the clinic excluding No Left ABI: [place] cellular tissue based products placed in the center since last visit: Has Dressing in Place as Prescribed: Yes Has Compression in Place as Prescribed: Yes Pain Present Now: No Electronic Signature(s) Signed: 03/22/2020 5:45:10 PM By: [name] RN Entered By: [name] on 03/22/2020 11:48:16 -------------------------------------------------------------------------------- Clinic Level of Care Assessment Details Patient Name: Date of Service: [facility] [name], [name] 03/22/2020 11:00 A M Medical Record Number: [id number] Patient Account Number: [id number] Date of Birth/Sex: Treating RN: 03-10-1951 (69 y.o. [name] Primary Care Thad Osoria: [name] RD, [name] Other Clinician: Referring Ocean Schildt: Treating Tracey Stewart/Extender: [name] RD, [name] Weeks in Treatment:  6 Clinic Level of Care Assessment Items TOOL 4 Quantity Score []  - 0 Use when only an EandM is performed on FOLLOW-UP visit ASSESSMENTS - Nursing Assessment / Reassessment []  - 0 Reassessment of Co-morbidities (includes updates in patient status) []  - 0 Reassessment of Adherence to Treatment Plan ASSESSMENTS - Wound and Skin A ssessment / Reassessment []  - 0 Simple Wound Assessment / Reassessment - one wound X- 3 5 Complex Wound Assessment / Reassessment - multiple wounds []  - 0 Dermatologic / Skin Assessment (not related to wound area) ASSESSMENTS - Focused Assessment []  - 0 Circumferential Edema Measurements - multi extremities []  - 0 Nutritional Assessment / Counseling / Intervention X- 1 5 Lower Extremity Assessment (monofilament, tuning fork, pulses) []  - 0 Peripheral Arterial Disease Assessment (using hand held doppler) ASSESSMENTS - Ostomy and/or Continence Assessment and Care []  - 0 Incontinence Assessment and Management []  - 0 Ostomy Care Assessment and Management (repouching, etc.) PROCESS - Coordination of Care []  - 0 Simple Patient / Family Education for ongoing care X- 1 20 Complex (extensive) Patient / Family Education for ongoing care []  - 0 Staff obtains Programmer, systems, Records, T Results / Process Orders est []  - 0 Staff telephones HHA, Nursing Homes / Clarify orders / etc []  - 0 Routine Transfer to another Facility (non-emergent condition) []  - 0 Routine Hospital Admission (non-emergent condition) []  - 0 New Admissions / Biomedical engineer / Ordering NPWT Apligraf, etc. , []  - 0 Emergency Hospital Admission (emergent condition) X- 1 10 Simple Discharge Coordination []  - 0 Complex (extensive) Discharge Coordination PROCESS - Special Needs []  - 0 Pediatric / Minor Patient Management []  - 0 Isolation Patient Management []  - 0 Hearing / Language / Visual special needs []  - 0 Assessment of Community assistance (transportation, D/C planning,  etc.) []  - 0 Additional assistance / Altered mentation []  - 0 Support Surface(s)  Assessment (bed, cushion, seat, etc.) INTERVENTIONS - Wound Cleansing / Measurement []  - 0 Simple Wound Cleansing - one wound X- 3 5 Complex Wound Cleansing - multiple wounds X- 1 5 Wound Imaging (photographs - any number of wounds) []  - 0 Wound Tracing (instead of photographs) []  - 0 Simple Wound Measurement - one wound X- 3 5 Complex Wound Measurement - multiple wounds INTERVENTIONS - Wound Dressings X - Small Wound Dressing one or multiple wounds 1 10 []  - 0 Medium Wound Dressing one or multiple wounds []  - 0 Large Wound Dressing one or multiple wounds X- 1 5 Application of Medications - topical []  - 0 Application of Medications - injection INTERVENTIONS - Miscellaneous []  - 0 External ear exam []  - 0 Specimen Collection (cultures, biopsies, blood, body fluids, etc.) []  - 0 Specimen(s) / Culture(s) sent or taken to Lab for analysis []  - 0 Patient Transfer (multiple staff / Civil Service fast streamer / Similar devices) []  - 0 Simple Staple / Suture removal (25 or less) []  - 0 Complex Staple / Suture removal (26 or more) []  - 0 Hypo / Hyperglycemic Management (close monitor of Blood Glucose) []  - 0 Ankle / Brachial Index (ABI) - do not check if billed separately X- 1 5 Vital Signs Has the patient been seen at the hospital within the last three years: Yes Total Score: 105 Level Of Care: New/Established - Level 3 Electronic Signature(s) Signed: 03/22/2020 6:44:49 PM By: [name] RN, BSN Entered By: [name] on 03/22/2020 12:11:47 -------------------------------------------------------------------------------- Lower Extremity Assessment Details Patient Name: Date of Service: [facility] [name], [place] [name] 03/22/2020 11:00 A M Medical Record Number: [id number] Patient Account Number: [id number] Date of Birth/Sex: Treating RN: [birth year] (69 y.o. [name] Primary Care Nancy Manuele: [name] RD, [name]  Other Clinician: Referring Rosetta Rupnow: Treating Teia Freitas/Extender: [name] RD, [name] Weeks in Treatment: 6 Edema Assessment Assessed: [Left: No] [Right: No] Edema: [Left: Yes] [Right: Yes] Calf Left: Right: Point of Measurement: 38 cm From Medial Instep 33.3 cm 36.3 cm Ankle Left: Right: Point of Measurement: 13 cm From Medial Instep 23.5 cm 25 cm Electronic Signature(s) Signed: 03/22/2020 5:45:10 PM By: [name] RN Entered By: [name] on 03/22/2020 11:49:20 -------------------------------------------------------------------------------- [place] Details Patient Name: Date of Service: [facility] [name], [place] [name] 03/22/2020 11:00 A M Medical Record Number: [id number] Patient Account Number: [id number] Date of Birth/Sex: Treating RN: [birth year] (69 y.o. [name] Primary Care Aliyanah Rozas: [name] RD, [name] Other Clinician: Referring Solstice Lastinger: Treating Deontre Allsup/Extender: [name] RD, [name] Weeks in Treatment: 6 Active Inactive Venous Leg Ulcer Nursing Diagnoses: Knowledge deficit related to disease process and management Potential for venous Insuffiency (use before diagnosis confirmed) Goals: Patient will maintain optimal edema control Date Initiated: 02/09/2020 Target Resolution Date: 04/05/2020 Goal Status: Active Patient/caregiver will verbalize understanding of disease process and disease management Date Initiated: 02/09/2020 Date Inactivated: 03/02/2020 Target Resolution Date: 03/08/2020 Goal Status: Met Interventions: Assess peripheral edema status every visit. Compression as ordered Provide education on venous insufficiency Treatment Activities: Therapeutic compression applied : 02/09/2020 Notes: Wound/Skin Impairment Nursing Diagnoses: Impaired tissue integrity Knowledge deficit related to ulceration/compromised skin integrity Goals: Patient/caregiver will verbalize understanding of skin care regimen Date  Initiated: 02/09/2020 Target Resolution Date: 04/05/2020 Goal Status: Active Ulcer/skin breakdown will have a volume reduction of 30% by week 4 Date Initiated: 02/09/2020 Date Inactivated: 03/02/2020 Target Resolution Date: 03/08/2020 Goal Status: Met Interventions: Assess patient/caregiver ability to obtain necessary supplies Assess patient/caregiver ability to  perform ulcer/skin care regimen upon admission and as needed Assess ulceration(s) every visit Provide education on ulcer and skin care Treatment Activities: Skin care regimen initiated : 02/09/2020 Topical wound management initiated : 02/09/2020 Notes: Electronic Signature(s) Signed: 03/22/2020 6:44:49 PM By: [name] RN, BSN Entered By: [name] on 03/22/2020 12:07:42 -------------------------------------------------------------------------------- Pain Assessment Details Patient Name: Date of Service: [name], [place] [name] 03/22/2020 11:00 A M Medical Record Number: [id number] Patient Account Number: [id number] Date of Birth/Sex: Treating RN: Jun 11, 1951 (69 y.o. [name] Primary Care Denasia Venn: [name] RD, [name] Other Clinician: Referring Shawntia Mangal: Treating Masud Holub/Extender: [name] RD, [name] Weeks in Treatment: 6 Active Problems Location of Pain Severity and Description of Pain Patient Has Paino No Site Locations Pain Management and Medication Current Pain Management: Electronic Signature(s) Signed: 03/22/2020 5:45:10 PM By: [name] RN Entered By: [name] on 03/22/2020 11:49:06 -------------------------------------------------------------------------------- Patient/Caregiver Education Details Patient Name: Date of Service: [name], [place] [name] 9/1/2021andnbsp11:00 [place] Record Number: [id number] Patient Account Number: [id number] Date of Birth/Gender: Treating RN: 02-12-1951 (69 y.o. [name] Primary Care Physician: Farr [place], [name] Other Clinician: Referring  Physician: Treating Physician/Extender: [name] RD, [name] Weeks in Treatment: 6 Education Assessment Education Provided To: Patient Education Topics Provided Venous: Methods: Explain/Verbal Responses: Reinforcements needed, State content correctly Wound/Skin Impairment: Methods: Explain/Verbal Responses: Reinforcements needed, State content correctly Electronic Signature(s) Signed: 03/22/2020 6:44:49 PM By: [name] RN, BSN Entered By: [name] on 03/22/2020 12:09:01 -------------------------------------------------------------------------------- Wound Assessment Details Patient Name: Date of Service: [name], [place] [name] 03/22/2020 11:00 A M Medical Record Number: [id number] Patient Account Number: [id number] Date of Birth/Sex: Treating RN: [birth year] (69 y.o. [name] Primary Care Sollie Vultaggio: [name] RD, [name] Other Clinician: Referring Eliberto Sole: Treating Peyson Postema/Extender: [name] RD, [name] Weeks in Treatment: 6 Wound Status Wound Number: 12 Primary Diabetic Wound/Ulcer of the Lower Extremity Etiology: Wound Location: Right, Distal, Medial Lower Leg Wound Healed - Epithelialized Wounding Event: Blister Status: Date Acquired: 03/08/2020 Comorbid Cataracts, Anemia, Asthma, Hypertension, Type II Diabetes, End Weeks Of Treatment: 2 History: Stage Renal Disease, Osteomyelitis, Neuropathy Clustered Wound: No Wound Measurements Length: (cm) Width: (cm) Depth: (cm) Area: (cm) Volume: (cm) 0 % Reduction in Area: 100% 0 % Reduction in Volume: 100% 0 Epithelialization: Large (67-100%) 0 Tunneling: No 0 Undermining: No Wound Description Classification: Grade 2 Wound Margin: Distinct, outline attached Exudate Amount: Small Exudate Type: Serous Exudate Color: amber Foul Odor After Cleansing: No Slough/Fibrino No Wound Bed Granulation Amount: Large (67-100%) Exposed Structure Granulation Quality: Red Fascia Exposed:  No Necrotic Amount: None Present (0%) Fat Layer (Subcutaneous Tissue) Exposed: Yes Tendon Exposed: No Muscle Exposed: No Joint Exposed: No Bone Exposed: No Electronic Signature(s) Signed: 03/22/2020 6:44:49 PM By: [name] RN, BSN Entered By: [name] on 03/22/2020 12:07:19 -------------------------------------------------------------------------------- Wound Assessment Details Patient Name: Date of Service: [name], [place] [name] 03/22/2020 11:00 A M Medical Record Number: [id number] Patient Account Number: [id number] Date of Birth/Sex: Treating RN: Apr 19, 1951 (69 y.o. [name] Primary Care Patricie Geeslin: [name] RD, [name] Other Clinician: Referring Carlito Bogert: Treating Shatika Grinnell/Extender: [name] RD, [name] Weeks in Treatment: 6 Wound Status Wound Number: [id number] Primary Diabetic Wound/Ulcer of the Lower Extremity Etiology: Wound Location: Right, Medial Malleolus Wound Open Wounding Event: Gradually Appeared Status: Date Acquired: 10/21/2019 Comorbid Cataracts, Anemia, Asthma, Hypertension, Type II Diabetes, End Weeks Of Treatment: 6 History: Stage Renal Disease, Osteomyelitis, Neuropathy Clustered  Wound: Yes Wound Measurements Length: (cm) Width: (cm) Depth: (cm) Clustered Quantity: Area: (cm) Volume: (cm) Wound Description Classification: Grade 2 Wound Margin: Distinct, outline attached Exudate Amount: Small Exudate Type: Serosanguineous Exudate Color: red, brown Foul Odor After Cleansing: Slough/Fibrino 0 % Reduction in Area: 100% 0 % Reduction in Volume: 100% 0 Epithelialization: None 4 Tunneling: No 0 Undermining: No 0 No Yes Wound Bed Granulation Amount: Large (67-100%) Exposed Structure Granulation Quality: Red Fascia Exposed: No Necrotic Amount: None Present (0%) Fat Layer (Subcutaneous Tissue) Exposed: Yes Tendon Exposed: No Muscle Exposed: No Joint Exposed: No Bone Exposed: No Electronic Signature(s) Signed: 03/22/2020  5:45:10 PM By: [name] RN Entered By: [name] on 03/22/2020 11:51:44 -------------------------------------------------------------------------------- Wound Assessment Details Patient Name: Date of Service: [name], [place] [name] 03/22/2020 11:00 A M Medical Record Number: [id number] Patient Account Number: [id number] Date of Birth/Sex: Treating RN: Feb 04, 1951 (69 y.o. [name] Primary Care Choua Ikner: [name] RD, [name] Other Clinician: Referring Jhana Giarratano: Treating Macrina Lehnert/Extender: [name] RD, [name] Weeks in Treatment: 6 Wound Status Wound Number: 8 Primary Diabetic Wound/Ulcer of the Lower Extremity Etiology: Wound Location: Left T Third oe Wound Open Wounding Event: Blister Status: Date Acquired: 02/23/2020 Comorbid Cataracts, Anemia, Asthma, Hypertension, Type II Diabetes, End Weeks Of Treatment: 4 History: Stage Renal Disease, Osteomyelitis, Neuropathy Clustered Wound: No Wound Measurements Length: (cm) 1 Width: (cm) 0.8 Depth: (cm) 0.1 Area: (cm) 0.628 Volume: (cm) 0.063 % Reduction in Area: -345.4% % Reduction in Volume: -350% Epithelialization: Small (1-33%) Tunneling: No Undermining: No Wound Description Classification: Grade 2 Wound Margin: Flat and Intact Exudate Amount: Small Exudate Type: Serous Exudate Color: amber Foul Odor After Cleansing: No Slough/Fibrino No Wound Bed Granulation Amount: None Present (0%) Exposed Structure Necrotic Amount: Large (67-100%) Fascia Exposed: No Necrotic Quality: Eschar Fat Layer (Subcutaneous Tissue) Exposed: Yes Tendon Exposed: No Muscle Exposed: No Joint Exposed: No Bone Exposed: No Electronic Signature(s) Signed: 03/22/2020 5:45:10 PM By: [name] RN Entered By: [name] on 03/22/2020 11:52:14 -------------------------------------------------------------------------------- [place] Details Patient Name: Date of Service: [name], [place] [name] 03/22/2020 11:00 A M Medical Record  Number: [id number] Patient Account Number: [id number] Date of Birth/Sex: Treating RN: [birth year] (69 y.o. [name] Primary Care Javonni Macke: [place], [name] Other Clinician: Referring Tanyiah Laurich: Treating Willean Schurman/Extender: [name] RD, [name] Weeks in Treatment: 6 Vital Signs Time Taken: 11:48 Temperature (F): 98.3 Height (in): 67 Pulse (bpm): 73 Weight (lbs): 202 Respiratory Rate (breaths/min): 16 Body Mass Index (BMI): 31.6 Blood Pressure (mmHg): 103/70 Reference Range: 80 - 120 mg / dl Electronic Signature(s) Signed: 03/22/2020 5:45:10 PM By: [name] RN Entered By: [name] on 03/22/2020 11:48:42

## 2020-03-23 DIAGNOSIS — D631 Anemia in chronic kidney disease: Secondary | ICD-10-CM | POA: Diagnosis not present

## 2020-03-23 DIAGNOSIS — N186 End stage renal disease: Secondary | ICD-10-CM | POA: Diagnosis not present

## 2020-03-23 DIAGNOSIS — N2581 Secondary hyperparathyroidism of renal origin: Secondary | ICD-10-CM | POA: Diagnosis not present

## 2020-03-23 DIAGNOSIS — D689 Coagulation defect, unspecified: Secondary | ICD-10-CM | POA: Diagnosis not present

## 2020-03-23 DIAGNOSIS — Z992 Dependence on renal dialysis: Secondary | ICD-10-CM | POA: Diagnosis not present

## 2020-03-25 DIAGNOSIS — N186 End stage renal disease: Secondary | ICD-10-CM | POA: Diagnosis not present

## 2020-03-25 DIAGNOSIS — Z992 Dependence on renal dialysis: Secondary | ICD-10-CM | POA: Diagnosis not present

## 2020-03-25 DIAGNOSIS — D631 Anemia in chronic kidney disease: Secondary | ICD-10-CM | POA: Diagnosis not present

## 2020-03-25 DIAGNOSIS — N2581 Secondary hyperparathyroidism of renal origin: Secondary | ICD-10-CM | POA: Diagnosis not present

## 2020-03-25 DIAGNOSIS — D689 Coagulation defect, unspecified: Secondary | ICD-10-CM | POA: Diagnosis not present

## 2020-03-28 ENCOUNTER — Other Ambulatory Visit: Payer: Self-pay | Admitting: Family Medicine

## 2020-03-28 DIAGNOSIS — D689 Coagulation defect, unspecified: Secondary | ICD-10-CM | POA: Diagnosis not present

## 2020-03-28 DIAGNOSIS — N2581 Secondary hyperparathyroidism of renal origin: Secondary | ICD-10-CM | POA: Diagnosis not present

## 2020-03-28 DIAGNOSIS — N186 End stage renal disease: Secondary | ICD-10-CM | POA: Diagnosis not present

## 2020-03-28 DIAGNOSIS — Z992 Dependence on renal dialysis: Secondary | ICD-10-CM | POA: Diagnosis not present

## 2020-03-28 DIAGNOSIS — D631 Anemia in chronic kidney disease: Secondary | ICD-10-CM | POA: Diagnosis not present

## 2020-03-29 ENCOUNTER — Encounter (HOSPITAL_BASED_OUTPATIENT_CLINIC_OR_DEPARTMENT_OTHER): Payer: Medicare Other | Admitting: Physician Assistant

## 2020-03-29 DIAGNOSIS — E11621 Type 2 diabetes mellitus with foot ulcer: Secondary | ICD-10-CM | POA: Diagnosis not present

## 2020-03-29 DIAGNOSIS — Z992 Dependence on renal dialysis: Secondary | ICD-10-CM | POA: Diagnosis not present

## 2020-03-29 DIAGNOSIS — E1122 Type 2 diabetes mellitus with diabetic chronic kidney disease: Secondary | ICD-10-CM | POA: Diagnosis not present

## 2020-03-29 DIAGNOSIS — L97812 Non-pressure chronic ulcer of other part of right lower leg with fat layer exposed: Secondary | ICD-10-CM | POA: Diagnosis not present

## 2020-03-29 DIAGNOSIS — L97822 Non-pressure chronic ulcer of other part of left lower leg with fat layer exposed: Secondary | ICD-10-CM | POA: Diagnosis not present

## 2020-03-29 DIAGNOSIS — L97312 Non-pressure chronic ulcer of right ankle with fat layer exposed: Secondary | ICD-10-CM | POA: Diagnosis not present

## 2020-03-29 DIAGNOSIS — L97522 Non-pressure chronic ulcer of other part of left foot with fat layer exposed: Secondary | ICD-10-CM | POA: Diagnosis not present

## 2020-03-29 DIAGNOSIS — L97512 Non-pressure chronic ulcer of other part of right foot with fat layer exposed: Secondary | ICD-10-CM | POA: Diagnosis not present

## 2020-03-29 DIAGNOSIS — N186 End stage renal disease: Secondary | ICD-10-CM | POA: Diagnosis not present

## 2020-03-29 DIAGNOSIS — I872 Venous insufficiency (chronic) (peripheral): Secondary | ICD-10-CM | POA: Diagnosis not present

## 2020-03-29 DIAGNOSIS — I120 Hypertensive chronic kidney disease with stage 5 chronic kidney disease or end stage renal disease: Secondary | ICD-10-CM | POA: Diagnosis not present

## 2020-03-29 DIAGNOSIS — E1140 Type 2 diabetes mellitus with diabetic neuropathy, unspecified: Secondary | ICD-10-CM | POA: Diagnosis not present

## 2020-03-29 DIAGNOSIS — E11622 Type 2 diabetes mellitus with other skin ulcer: Secondary | ICD-10-CM | POA: Diagnosis not present

## 2020-03-29 NOTE — Progress Notes (Signed)
SAKAI, HEINLE ([id number]) Visit Report for 03/29/2020 Arrival Information Details Patient Name: Date of Service: [name], [name] 03/29/2020 10:00 A M Medical Record Number: [id number] Patient Account Number: [id number] Date of Birth/Sex: Treating RN: Dec 28, 1950 (69 y.o. [name] Primary Care Norton Bivins: [name] RD, [name] Other Clinician: Referring Joeanne Robicheaux: Treating Joseh Sjogren/Extender: [name] RD, [name] Weeks in Treatment: 7 Visit Information History Since Last Visit Added or deleted any medications: No Patient Arrived: Cane Any new allergies or adverse reactions: No Arrival Time: 10:05 Had a fall or experienced change in No Accompanied By: self activities of daily living that may affect Transfer Assistance: None risk of falls: Patient Identification Verified: Yes Signs or symptoms of abuse/neglect since last visito No Secondary Verification Process Completed: Yes Hospitalized since last visit: No Patient Requires Transmission-Based Precautions: No Implantable device outside of the clinic excluding No Patient Has Alerts: Yes cellular tissue based products placed in the center Patient Alerts: Right ABI: [place] since last visit: Left ABI: [place] Has Dressing in Place as Prescribed: Yes Has Compression in Place as Prescribed: Yes Pain Present Now: No Electronic Signature(s) Signed: 03/29/2020 5:39:04 PM By: [name] Entered By: [name] on 03/29/2020 10:13:28 -------------------------------------------------------------------------------- Compression Therapy Details Patient Name: Date of Service: [name], [place] [name] 03/29/2020 10:00 A M Medical Record Number: [id number] Patient Account Number: [id number] Date of Birth/Sex: Treating RN: 09-Jun-1951 (69 y.o. [name] Primary Care Raymond Azure: [name] RD, [name] Other Clinician: Referring Jaslen Adcox: Treating Elwanda Moger/Extender: [name] RD, [name] Weeks in Treatment: 7 Compression Therapy  Performed for Wound Assessment: Wound #11R Right,Proximal,Medial Lower Leg Performed By: Clinician [name], RN Compression Type: Three Layer Post Procedure Diagnosis Same as Pre-procedure Electronic Signature(s) Signed: 03/29/2020 5:28:47 PM By: [name] RN, BSN Entered By: [name] on 03/29/2020 10:55:33 -------------------------------------------------------------------------------- Compression Therapy Details Patient Name: Date of Service: [name], [place] [name] 03/29/2020 10:00 A M Medical Record Number: [id number] Patient Account Number: [id number] Date of Birth/Sex: Treating RN: 07-Nov-1950 (69 y.o. [name] Primary Care Rindi Beechy: [name] RD, [name] Other Clinician: Referring Kareen Jefferys: Treating Byren Pankow/Extender: [name] RD, [name] Weeks in Treatment: 7 Compression Therapy Performed for Wound Assessment: NonWound Condition Lymphedema - Left Leg Performed By: Clinician [name], RN Compression Type: Three Layer Post Procedure Diagnosis Same as Pre-procedure Electronic Signature(s) Signed: 03/29/2020 5:28:47 PM By: [name] RN, BSN Entered By: [name] on 03/29/2020 11:03:16 -------------------------------------------------------------------------------- Encounter Discharge Information Details Patient Name: Date of Service: [facility] [name], [place] [name] 03/29/2020 10:00 A M Medical Record Number: [id number] Patient Account Number: [id number] Date of Birth/Sex: Treating RN: 01/08/1951 (68 y.o. [name] Primary Care Jaelyne Deeg: [name] RD, [name] Other Clinician: Referring Kamaiya Antilla: Treating Hanz Winterhalter/Extender: [name] RD, [name] Weeks in Treatment: 7 Encounter Discharge Information Items Post Procedure Vitals Discharge Condition: Stable Temperature (F): 97.1 Ambulatory Status: Cane Pulse (bpm): 87 Discharge Destination: Home Respiratory Rate (breaths/min): 18 Transportation: Private Auto Blood Pressure (mmHg):  120/67 Accompanied By: self Schedule Follow-up Appointment: Yes Clinical Summary of Care: Electronic Signature(s) Signed: 03/29/2020 5:39:04 PM By: [name] Entered By: [name] on 03/29/2020 11:29:02 -------------------------------------------------------------------------------- Lower Extremity Assessment Details Patient Name: Date of Service: [facility] [name], [name] 03/29/2020 10:00 A M Medical Record Number: [id number] Patient Account Number: [id number] Date of Birth/Sex: Treating RN: 1950/07/26 (69 y.o. [name] Primary Care Tykeem Lanzer: [place], [name] Other Clinician: Referring Tyquasia Pant: Treating Gaylen Pereira/Extender: Melburn Hake,  Lorenso Courier RD, [name] Weeks in Treatment: 7 Edema Assessment Assessed: [Left: Yes] [Right: Yes] Edema: [Left: Yes] [Right: Yes] Calf Left: Right: Point of Measurement: 38 cm From Medial Instep 36 cm 35 cm Ankle Left: Right: Point of Measurement: 13 cm From Medial Instep 24 cm 25.5 cm Vascular Assessment Pulses: Dorsalis Pedis Palpable: [Left:Yes] [Right:Yes] Electronic Signature(s) Signed: 03/29/2020 5:39:04 PM By: [name] Entered By: [name] on 03/29/2020 10:14:28 -------------------------------------------------------------------------------- [place] Details Patient Name: Date of Service: [facility] [name], [place] [name] 03/29/2020 10:00 A M Medical Record Number: [id number] Patient Account Number: [id number] Date of Birth/Sex: Treating RN: 1951-07-18 (69 y.o. [name] Primary Care Smita Lesh: [name] RD, [name] Other Clinician: Referring Neriah Brott: Treating Verdean Murin/Extender: [name] RD, [name] Weeks in Treatment: 7 Active Inactive Venous Leg Ulcer Nursing Diagnoses: Knowledge deficit related to disease process and management Potential for venous Insuffiency (use before diagnosis confirmed) Goals: Patient will maintain optimal edema control Date Initiated: 02/09/2020 Target Resolution  Date: 04/05/2020 Goal Status: Active Patient/caregiver will verbalize understanding of disease process and disease management Date Initiated: 02/09/2020 Date Inactivated: 03/02/2020 Target Resolution Date: 03/08/2020 Goal Status: Met Interventions: Assess peripheral edema status every visit. Compression as ordered Provide education on venous insufficiency Treatment Activities: Therapeutic compression applied : 02/09/2020 Notes: Wound/Skin Impairment Nursing Diagnoses: Impaired tissue integrity Knowledge deficit related to ulceration/compromised skin integrity Goals: Patient/caregiver will verbalize understanding of skin care regimen Date Initiated: 02/09/2020 Target Resolution Date: 04/05/2020 Goal Status: Active Ulcer/skin breakdown will have a volume reduction of 30% by week 4 Date Initiated: 02/09/2020 Date Inactivated: 03/02/2020 Target Resolution Date: 03/08/2020 Goal Status: Met Interventions: Assess patient/caregiver ability to obtain necessary supplies Assess patient/caregiver ability to perform ulcer/skin care regimen upon admission and as needed Assess ulceration(s) every visit Provide education on ulcer and skin care Treatment Activities: Skin care regimen initiated : 02/09/2020 Topical wound management initiated : 02/09/2020 Notes: Electronic Signature(s) Signed: 03/29/2020 5:28:47 PM By: [name] RN, BSN Entered By: [name] on 03/29/2020 10:54:21 -------------------------------------------------------------------------------- Pain Assessment Details Patient Name: Date of Service: [name], [place] [name] 03/29/2020 10:00 A M Medical Record Number: [id number] Patient Account Number: [id number] Date of Birth/Sex: Treating RN: 08-Dec-1950 (69 y.o. [name] Primary Care Cire Clute: [name] RD, [name] Other Clinician: Referring Prashant Glosser: Treating Andreya Lacks/Extender: [name] RD, [name] Weeks in Treatment: 7 Active Problems Location of Pain  Severity and Description of Pain Patient Has Paino No Site Locations Rate the pain. Current Pain Level: 0 Pain Management and Medication Current Pain Management: Medication: No Cold Application: No Rest: No Massage: No Activity: No T.E.N.S.: No Heat Application: No Leg drop or elevation: No Is the Current Pain Management Adequate: Adequate How does your wound impact your activities of daily livingo Sleep: No Bathing: No Appetite: No Relationship With Others: No Bladder Continence: No Emotions: No Bladder Continence: No Emotions: No Bowel Continence: No Work: No Toileting: No Drive: No Dressing: No Hobbies: No Electronic Signature(s) Signed: 03/29/2020 5:39:04 PM By: [name] Entered By: [name] on 03/29/2020 10:13:54 -------------------------------------------------------------------------------- Patient/Caregiver Education Details Patient Name: Date of Service: [name], [place] [name] 9/8/2021andnbsp10:00 [place] Record Number: [id number] Patient Account Number: [id number] Date of Birth/Gender: Treating RN: [birth year] (69 y.o. [name] Primary Care Physician: [place], [name] Other Clinician: Referring Physician: Treating Physician/Extender: [name] RD, [name] Weeks in Treatment: 7 Education Assessment Education Provided To: Patient Education Topics Provided Venous: Methods: Explain/Verbal Responses: Reinforcements  needed, State content correctly Wound/Skin Impairment: Methods: Explain/Verbal Responses: Reinforcements needed, State content correctly Electronic Signature(s) Signed: 03/29/2020 5:28:47 PM By: [name] RN, BSN Entered By: [name] on 03/29/2020 10:54:42 -------------------------------------------------------------------------------- Wound Assessment Details Patient Name: Date of Service: [facility] [name], [place] [name] 03/29/2020 10:00 A M Medical Record Number: [id number] Patient Account Number:  [id number] Date of Birth/Sex: Treating RN: 1950/08/05 (69 y.o. [name] Primary Care Sherylann Vangorden: [name] RD, [name] Other Clinician: Referring Griffyn Kucinski: Treating Ahmod Gillespie/Extender: [name] RD, [name] Weeks in Treatment: 7 Wound Status Wound Number: [id number] Primary Diabetic Wound/Ulcer of the Lower Extremity Etiology: Wound Location: Right, Proximal, Medial Lower Leg Wound Open Wounding Event: Blister Status: Date Acquired: 03/08/2020 Comorbid Cataracts, Anemia, Asthma, Hypertension, Type II Diabetes, End Weeks Of Treatment: 3 History: Stage Renal Disease, Osteomyelitis, Neuropathy Clustered Wound: Yes Wound Measurements Length: (cm) 0.6 Width: (cm) Depth: (cm) Clustered Quantity: Area: (cm) Volume: (cm) % Reduction in Area: 78.7% 1.6 % Reduction in Volume: 78.8% 0.1 Epithelialization: None 1 Tunneling: No 0.754 Undermining: No 0.075 Wound Description Classification: Grade 2 Wound Margin: Distinct, outline attached Exudate Amount: Medium Exudate Type: Serosanguineous Exudate Color: red, brown Foul Odor After Cleansing: No Slough/Fibrino No Wound Bed Granulation Amount: Large (67-100%) Exposed Structure Granulation Quality: Red, Pink Fascia Exposed: No Necrotic Amount: None Present (0%) Fat Layer (Subcutaneous Tissue) Exposed: No Tendon Exposed: No Muscle Exposed: No Joint Exposed: No Bone Exposed: No Treatment Notes Wound #11R (Right, Proximal, Medial Lower Leg) 1. Cleanse With Wound Cleanser Soap and water 2. Periwound Care Moisturizing lotion 3. Primary Dressing Applied Calcium Alginate Ag 4. Secondary Dressing ABD Pad Dry Gauze 6. Support Layer Applied 3 layer compression wrap Notes stockinette. Electronic Signature(s) Signed: 03/29/2020 5:39:04 PM By: [name] Entered By: [name] on 03/29/2020 10:09:33 -------------------------------------------------------------------------------- Wound Assessment Details Patient  Name: Date of Service: [facility] [name], [place] [name] 03/29/2020 10:00 A M Medical Record Number: [id number] Patient Account Number: [id number] Date of Birth/Sex: Treating RN: 1950-10-28 (69 y.o. [name] Primary Care Melany Wiesman: [name] RD, [name] Other Clinician: Referring Zyairah Wacha: Treating Cherith Tewell/Extender: [name] RD, [name] Weeks in Treatment: 7 Wound Status Wound Number: [id number] Primary Diabetic Wound/Ulcer of the Lower Extremity Etiology: Wound Location: Right, Distal, Medial Lower Leg Wound Open Wounding Event: Blister Status: Date Acquired: 03/08/2020 Comorbid Cataracts, Anemia, Asthma, Hypertension, Type II Diabetes, End Weeks Of Treatment: 3 History: Stage Renal Disease, Osteomyelitis, Neuropathy Clustered Wound: No Wound Measurements Length: (cm) 1.5 Width: (cm) 1.6 Depth: (cm) 0.1 Area: (cm) 1.885 Volume: (cm) 0.188 % Reduction in Area: 25.5% % Reduction in Volume: 25.7% Epithelialization: Medium (34-66%) Tunneling: No Undermining: No Wound Description Classification: Grade 2 Wound Margin: Distinct, outline attached Exudate Amount: Small Exudate Type: Serous Exudate Color: amber Foul Odor After Cleansing: No Slough/Fibrino No Wound Bed Granulation Amount: Large (67-100%) Exposed Structure Granulation Quality: Red Fascia Exposed: No Necrotic Amount: None Present (0%) Fat Layer (Subcutaneous Tissue) Exposed: Yes Tendon Exposed: No Muscle Exposed: No Joint Exposed: No Bone Exposed: No Treatment Notes Wound #12R (Right, Distal, Medial Lower Leg) 1. Cleanse With Wound Cleanser Soap and water 2. Periwound Care Moisturizing lotion 3. Primary Dressing Applied Calcium Alginate Ag 4. Secondary Dressing ABD Pad Dry Gauze 6. Support Layer Applied 3 layer compression wrap Notes stockinette. Electronic Signature(s) Signed: 03/29/2020 5:39:04 PM By: [name] Entered By: [name] on 03/29/2020  10:10:23 -------------------------------------------------------------------------------- Wound Assessment Details Patient Name: Date of Service: [name], [place] [name] 03/29/2020 10:00 [place]  Record Number: [id number] Patient Account Number: [id number] Date of Birth/Sex: Treating RN: [birth year] (69 y.o. [name] Primary Care Rilda Bulls: [name] RD, [name] Other Clinician: Referring Emmanuelle Coxe: Treating Cydne Grahn/Extender: [name] RD, [name] Weeks in Treatment: 7 Wound Status Wound Number: 13 Primary Diabetic Wound/Ulcer of the Lower Extremity Etiology: Wound Location: Right, Anterior Lower Leg Secondary Venous Leg Ulcer Wounding Event: Gradually Appeared Etiology: Date Acquired: 03/25/2020 Wound Open Weeks Of Treatment: 0 Status: Clustered Wound: Yes Comorbid Cataracts, Anemia, Asthma, Hypertension, Type II Diabetes, History: End Stage Renal Disease, Osteomyelitis, Neuropathy Wound Measurements Length: (cm) Width: (cm) Depth: (cm) Clustered Quantity: Area: (cm) Volume: (cm) 7 % Reduction in Area: 1.7 % Reduction in Volume: 0.1 Epithelialization: Small (1-33%) 3 Tunneling: No 9.346 Undermining: No 0.935 Wound Description Classification: Grade 2 Wound Margin: Distinct, outline attached Exudate Amount: Medium Exudate Type: Serosanguineous Exudate Color: red, brown Foul Odor After Cleansing: No Slough/Fibrino No Wound Bed Granulation Amount: Large (67-100%) Exposed Structure Granulation Quality: Pink, Pale Fascia Exposed: No Fat Layer (Subcutaneous Tissue) Exposed: Yes Tendon Exposed: No Muscle Exposed: No Joint Exposed: No Bone Exposed: No Treatment Notes Wound #13 (Right, Anterior Lower Leg) 1. Cleanse With Wound Cleanser Soap and water 2. Periwound Care Moisturizing lotion 3. Primary Dressing Applied Calcium Alginate Ag 4. Secondary Dressing ABD Pad Dry Gauze 6. Support Layer Applied 3 layer compression  wrap Notes stockinette. Electronic Signature(s) Signed: 03/29/2020 5:39:04 PM By: [name] Entered By: [name] on 03/29/2020 10:12:01 -------------------------------------------------------------------------------- Wound Assessment Details Patient Name: Date of Service: [facility] [name], [place] [name] 03/29/2020 10:00 A M Medical Record Number: [id number] Patient Account Number: [id number] Date of Birth/Sex: Treating RN: 1951/01/11 (69 y.o. [name] Primary Care Aleisa Howk: [name] RD, [name] Other Clinician: Referring Nakyla Bracco: Treating Darielle Hancher/Extender: [name] RD, [name] Weeks in Treatment: 7 Wound Status Wound Number: 14 Primary Diabetic Wound/Ulcer of the Lower Extremity Etiology: Wound Location: Right, Medial Ankle Wound Open Wounding Event: Gradually Appeared Status: Date Acquired: 03/25/2020 Comorbid Cataracts, Anemia, Asthma, Hypertension, Type II Diabetes, End Weeks Of Treatment: 0 History: Stage Renal Disease, Osteomyelitis, Neuropathy Clustered Wound: No Wound Measurements Length: (cm) 0.6 Width: (cm) 0.7 Depth: (cm) 0.1 Area: (cm) 0.33 Volume: (cm) 0.033 % Reduction in Area: % Reduction in Volume: Epithelialization: None Tunneling: No Undermining: No Wound Description Classification: Grade 2 Wound Margin: Distinct, outline attached Exudate Amount: Medium Foul Odor After Cleansing: No Slough/Fibrino Yes Wound Bed Granulation Amount: Medium (34-66%) Exposed Structure Granulation Quality: Pink Fascia Exposed: No Necrotic Amount: Medium (34-66%) Fat Layer (Subcutaneous Tissue) Exposed: Yes Necrotic Quality: Adherent Slough Tendon Exposed: No Muscle Exposed: No Joint Exposed: No Bone Exposed: No Treatment Notes Wound #14 (Right, Medial Ankle) 1. Cleanse With Wound Cleanser Soap and water 2. Periwound Care Moisturizing lotion 3. Primary Dressing Applied Calcium Alginate Ag 4. Secondary Dressing ABD Pad Dry Gauze 6. Support Layer  Applied 3 layer compression wrap Notes stockinette. Electronic Signature(s) Signed: 03/29/2020 5:39:04 PM By: [name] Entered By: [name] on 03/29/2020 10:12:56 -------------------------------------------------------------------------------- Wound Assessment Details Patient Name: Date of Service: [facility] [name], [place] [name] 03/29/2020 10:00 A M Medical Record Number: [id number] Patient Account Number: [id number] Date of Birth/Sex: Treating RN: May 07, 1951 (69 y.o. [name] Primary Care Avrohom Mckelvin: [name] RD, [name] Other Clinician: Referring Kaisei Gilbo: Treating Jahmere Bramel/Extender: [name] RD, [name] Weeks in Treatment: 7 Wound Status Wound Number: 8 Primary Diabetic Wound/Ulcer of the Lower Extremity Etiology: Wound Location: Left T Third oe Wound Open  Wounding Event: Blister Status: Date Acquired: 02/23/2020 Comorbid Cataracts, Anemia, Asthma, Hypertension, Type II Diabetes, End Weeks Of Treatment: 5 History: Stage Renal Disease, Osteomyelitis, Neuropathy Clustered Wound: No Wound Measurements Length: (cm) 1 Width: (cm) 1 Depth: (cm) 0.1 Area: (cm) 0.785 Volume: (cm) 0.079 % Reduction in Area: -456.7% % Reduction in Volume: -464.3% Epithelialization: None Tunneling: No Undermining: No Wound Description Classification: Grade 2 Wound Margin: Flat and Intact Exudate Amount: Small Exudate Type: Serous Exudate Color: amber Foul Odor After Cleansing: No Slough/Fibrino Yes Wound Bed Granulation Amount: None Present (0%) Exposed Structure Necrotic Amount: Large (67-100%) Fascia Exposed: No Necrotic Quality: Eschar, Adherent Slough Fat Layer (Subcutaneous Tissue) Exposed: Yes Tendon Exposed: No Muscle Exposed: No Joint Exposed: No Bone Exposed: No Treatment Notes Wound #8 (Left Toe Third) 1. Cleanse With Wound Cleanser Soap and water 3. Primary Dressing Applied Santyl 4. Secondary Dressing Dry Gauze 5. Secured With Tape Notes netting.  explained how to apply santyl, frequency of change, and when to return to wound center. patient in agreement. Electronic Signature(s) Signed: 03/29/2020 5:39:04 PM By: [name] Entered By: [name] on 03/29/2020 10:10:47 -------------------------------------------------------------------------------- Vitals Details Patient Name: Date of Service: [name], [place] [name] 03/29/2020 10:00 A M Medical Record Number: [id number] Patient Account Number: [id number] Date of Birth/Sex: Treating RN: June 23, 1951 (69 y.o. [name] Primary Care Shantasia Hunnell: [name] RD, [name] Other Clinician: Referring Darlette Dubow: Treating Vasilia Dise/Extender: [name] RD, [name] Weeks in Treatment: 7 Vital Signs Time Taken: 10:07 Temperature (F): 97.1 Height (in): 67 Pulse (bpm): 87 Weight (lbs): 202 Respiratory Rate (breaths/min): 18 Body Mass Index (BMI): 31.6 Blood Pressure (mmHg): 120/67 Reference Range: 80 - 120 mg / dl Electronic Signature(s) Signed: 03/29/2020 5:39:04 PM By: [name] Entered By: [name] on 03/29/2020 10:13:44

## 2020-03-29 NOTE — Progress Notes (Addendum)
AUBRINA, NIEMAN ([id number]) Visit Report for 03/29/2020 Chief Complaint Document Details Patient Name: Date of Service: [name], [name] 03/29/2020 10:00 [place] Record Number: [id number] Patient Account Number: [id number] Date of Birth/Sex: Treating RN: 02/19/1951 (69 y.o. [name] Primary Care Provider: [facility], [name] Other Clinician: Referring Provider: Treating Provider/Extender: [name] RD, [name] Weeks in Treatment: 7 Information Obtained from: Patient Chief Complaint Multiple LE Ulcers Electronic Signature(s) Signed: 03/29/2020 9:59:18 AM By: [name] PA-C Entered By: [name] on 03/29/2020 09:59:18 -------------------------------------------------------------------------------- Debridement Details Patient Name: Date of Service: [facility] [name], [place] [name] 03/29/2020 10:00 A M Medical Record Number: [id number] Patient Account Number: [id number] Date of Birth/Sex: Treating RN: 09-19-1950 (69 y.o. [name] Primary Care Provider: [place], [name] Other Clinician: Referring Provider: Treating Provider/Extender: [name] RD, [name] Weeks in Treatment: 7 Debridement Performed for Assessment: Wound #8 Left T Third oe Performed By: Clinician [name], RN Debridement Type: Chemical/Enzymatic/Mechanical Agent Used: Santyl Severity of Tissue Pre Debridement: Fat layer exposed Level of Consciousness (Pre-procedure): Awake and Alert Pre-procedure Verification/Time Out Yes - 10:55 Taken: Bleeding: None Response to Treatment: Procedure was tolerated well Level of Consciousness (Post- Awake and Alert procedure): Post Debridement Measurements of Total Wound Length: (cm) 1 Width: (cm) 1 Depth: (cm) 0.1 Volume: (cm) 0.079 Character of Wound/Ulcer Post Debridement: Requires Further Debridement Severity of Tissue Post Debridement: Fat layer exposed Post Procedure Diagnosis Same as Pre-procedure Electronic  Signature(s) Signed: 03/29/2020 5:28:47 PM By: [name] RN, BSN Signed: 03/29/2020 5:46:37 PM By: [name] PA-C Entered By: [name] on 03/29/2020 10:58:23 -------------------------------------------------------------------------------- HPI Details Patient Name: Date of Service: [name], [place] [name] 03/29/2020 10:00 A M Medical Record Number: [id number] Patient Account Number: [id number] Date of Birth/Sex: Treating RN: 1950/08/23 (70 y.o. [name] Primary Care Provider: [name] RD, [name] Other Clinician: Referring Provider: Treating Provider/Extender: [name] RD, [name] Weeks in Treatment: 7 History of Present Illness HPI Description: Evaluate7/21/2021 today patient presents for evaluation of ulcerations on her legs which she tells me tend to come and go as far as small blistering locations and sometimes are better than other times. Right now she tells me that this is actually doing a little bit better but nonetheless will not completely go away. She has ulcerations bilaterally on her lower extremities. The right is worse than the left. 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 The patient has no evidence of active infection at this time. She however appears to have proficient blood flow into the extremities and I think would tolerate a 3 layer compression wrap she was noncompressible on arterial studies. Nonetheless there was no obvious signs of occlusion. 02/16/2020 on evaluation today patient appears to be doing much better in regard to her wounds in general. On the past week she has made significant improvements which is great news there is no signs of active infection at this time. No fevers, chills, nausea, vomiting, or diarrhea. 02/23/2020 on evaluation today patient appears to be doing well in regard to her lower extremity ulcers and  ankle ulcers. Overall I feel like she is making great progress and in general I am extremely pleased with how things stand. There is no sign of active infection at this time which is also good news. She will require slight debridement around  the right ankle region. 03/02/20-Patient returns at 1 week, has few new areas open on the left leg, especially 1 healed area on the medial ankle that has slight small open area now, to anterior leg areas that started out as blisters that are open now. She was using juxta lite compression to the left, she used Band-Aid to cover the vesicle that had ruptured on the left anterior leg, we are using 3 layer compression on the right. We are using silver alginate to the toe wound on the left. 03/08/2020 upon evaluation today patient unfortunately is doing much worse in regard to her right leg although her left leg looks like is doing better. Again we have taken her compression wraps off last week since she was doing well and transitioned her to her Velcro compression wrap. However apparently the patient is stating this was not put on here in the clinic and she never put it on at home. With that being said unfortunately she developed swelling and opened up new wounds which are present today she has quite a few of them in fact. 03/15/2020 on evaluation today patient appears to be doing well with regard to her wounds on the lower extremities bilaterally. Fortunately there is no signs of active infection at this time. No fever chills noted. Overall I am actually very pleased with where things stand currently. 03/22/2020 on evaluation today patient appears to be doing fairly well in regard to her wounds in general. She just has really one area left on her toe everything else is healed on her legs. She does have her Velcro compression wraps which I think are appropriate at this point. Fortunately there is no signs of active infection at this time. No fevers, chills, nausea,  vomiting, or diarrhea. 03/29/2020 on evaluation today patient appears to be doing poorly in regard to her right leg which is pretty much completely reopened compared to last week. She states it was okay until Saturday when she woke up she had blisters. With that being said it appears that this is likely stemming from the fact that the patient is apparently though I just found this out today going to sleep in her bed with her legs up on the bed with her but throughout the night she tends to end up with her legs hanging off the side or bottom of the bed on the floor. With that being said I think that this likely is happening much too long and then she has significant swelling the builds up as she sleeps. Obviously I think this is the reasoning behind what we are seeing currently. Electronic Signature(s) Signed: 03/29/2020 11:07:45 AM By: [name] PA-C Entered By: [name] on 03/29/2020 11:07:44 -------------------------------------------------------------------------------- Physical Exam Details Patient Name: Date of Service: [facility] [name], [name] 03/29/2020 10:00 A M Medical Record Number: [id number] Patient Account Number: [id number] Date of Birth/Sex: Treating RN: Jul 22, 1951 (69 y.o. [name] Primary Care Provider: [name] RD, [name] Other Clinician: Referring Provider: Treating Provider/Extender: [name] RD, [name] Weeks in Treatment: 7 Constitutional Well-nourished and well-hydrated in no acute distress. Respiratory normal breathing without difficulty. Psychiatric this patient is able to make decisions and demonstrates good insight into disease process. Alert and Oriented x 3. pleasant and cooperative. Notes Upon inspection patient's wounds actually showed signs to be pretty enlarged on the right lower extremity compared to last week. Unfortunately I think that she needs to have something different to sleep on in order to  keep her legs from falling off she  states that she has an access to hospital bed which could potentially be helpful for her. With that being said I do question whether or not she has restless leg syndrome she may or may not I do not really know that maybe some of the discussed with her primary care provider. Electronic Signature(s) Signed: 03/29/2020 11:08:17 AM By: [name] PA-C Entered By: [name] on 03/29/2020 11:08:16 -------------------------------------------------------------------------------- Physician Orders Details Patient Name: Date of Service: [facility] [name], [place] [name] 03/29/2020 10:00 A M Medical Record Number: [id number] Patient Account Number: [id number] Date of Birth/Sex: Treating RN: [birth year] (69 y.o. [name] Primary Care Provider: [name] RD, [name] Other Clinician: Referring Provider: Treating Provider/Extender: [name] RD, [name] Weeks in Treatment: 7 Verbal / Phone Orders: No Diagnosis Coding ICD-10 Coding Code Description I87.2 Venous insufficiency (chronic) (peripheral) E11.621 Type 2 diabetes mellitus with foot ulcer L97.822 Non-pressure chronic ulcer of other part of left lower leg with fat layer exposed L97.312 Non-pressure chronic ulcer of right ankle with fat layer exposed L97.812 Non-pressure chronic ulcer of other part of right lower leg with fat layer exposed L97.512 Non-pressure chronic ulcer of other part of right foot with fat layer exposed N18.6 End stage renal disease Z99.2 Dependence on renal dialysis I10 Essential (primary) hypertension Follow-up Appointments Return Appointment in 1 week. Dressing Change Frequency Do not change entire dressing for one week. - right legs wounds Wound #8 Left T Third oe Change dressing every day. Skin Barriers/Peri-Wound Care Moisturizing lotion - to leg Wound Cleansing Wound #8 Left T Third oe May shower and wash wound with soap and water. Primary Wound Dressing Wound #11R Right,Proximal,Medial Lower  Leg Calcium Alginate with Silver Wound #12R Right,Distal,Medial Lower Leg Calcium Alginate with Silver Wound #13 Right,Anterior Lower Leg Calcium Alginate with Silver Wound #14 Right,Medial Ankle Calcium Alginate with Silver Wound #8 Left T Third oe Santyl Ointment Secondary Dressing Wound #11R Right,Proximal,Medial Lower Leg Dry Gauze ABD pad Wound #12R Right,Distal,Medial Lower Leg Dry Gauze ABD pad Wound #13 Right,Anterior Lower Leg Dry Gauze ABD pad Wound #14 Right,Medial Ankle Dry Gauze ABD pad Wound #8 Left T Third oe Kerlix/Rolled Gauze Dry Gauze Edema Control 3 Layer Compression System - Bilateral Avoid standing for long periods of time Elevate legs to the level of the heart or above for 30 minutes daily and/or when sitting, a frequency of: - throughout the day Exercise regularly Patient Medications llergies: penicillin, adhesive tape A Notifications Medication Indication Start End 03/29/2020 Santyl DOSE topical 250 unit/gram ointment - ointment topical Apply nickel thick daily to the wound bed and then cover with a dressing as directed in clinic Electronic Signature(s) Signed: 03/29/2020 11:10:59 AM By: [name] PA-C Entered By: [name] on 03/29/2020 11:10:58 -------------------------------------------------------------------------------- Problem List Details Patient Name: Date of Service: [facility] [name], [place] [name] 03/29/2020 10:00 A M Medical Record Number: [id number] Patient Account Number: [id number] Date of Birth/Sex: Treating RN: 1950/11/22 (69 y.o. Martyn Malay, [place] Primary Care Provider: [name] RD, [name] Other Clinician: Referring Provider: Treating Provider/Extender: [name] RD, [name] Weeks in Treatment: 7 Active Problems ICD-10 Encounter Code Description Active Date MDM Diagnosis I87.2 Venous insufficiency (chronic) (peripheral) 02/09/2020 No Yes E11.621 Type 2 diabetes mellitus with foot ulcer 02/09/2020 No  Yes L97.822 Non-pressure chronic ulcer of other part of left lower leg with fat layer exposed7/21/2021 No Yes L97.312 Non-pressure chronic ulcer of right ankle with  fat layer exposed 02/09/2020 No Yes L97.812 Non-pressure chronic ulcer of other part of right lower leg with fat layer 02/09/2020 No Yes exposed L97.512 Non-pressure chronic ulcer of other part of right foot with fat layer exposed 02/09/2020 No Yes N18.6 End stage renal disease 02/09/2020 No Yes Z99.2 Dependence on renal dialysis 02/09/2020 No Yes I10 Essential (primary) hypertension 02/09/2020 No Yes Inactive Problems Resolved Problems Electronic Signature(s) Signed: 03/29/2020 9:59:11 AM By: [name] PA-C Entered By: [name] on 03/29/2020 09:59:10 -------------------------------------------------------------------------------- Progress Note Details Patient Name: Date of Service: [facility] [name], [place] [name] 03/29/2020 10:00 A M Medical Record Number: [id number] Patient Account Number: [id number] Date of Birth/Sex: Treating RN: 06/08/1951 (69 y.o. [name] Primary Care Provider: [name] RD, [name] Other Clinician: Referring Provider: Treating Provider/Extender: [name] RD, [name] Weeks in Treatment: 7 Subjective Chief Complaint Information obtained from Patient Multiple LE Ulcers History of Present Illness (HPI) Evaluate7/21/2021 today patient presents for evaluation of ulcerations on her legs which she tells me tend to come and go as far as small blistering locations and sometimes are better than other times. Right now she tells me that this is actually doing a little bit better but nonetheless will not completely go away. She has ulcerations bilaterally on her lower extremities. The right is worse than the left. 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 The patient has no evidence of  active infection at this time. She however appears to have proficient blood flow into the extremities and I think would tolerate a 3 layer compression wrap she was noncompressible on arterial studies. Nonetheless there was no obvious signs of occlusion. 02/16/2020 on evaluation today patient appears to be doing much better in regard to her wounds in general. On the past week she has made significant improvements which is great news there is no signs of active infection at this time. No fevers, chills, nausea, vomiting, or diarrhea. 02/23/2020 on evaluation today patient appears to be doing well in regard to her lower extremity ulcers and ankle ulcers. Overall I feel like she is making great progress and in general I am extremely pleased with how things stand. There is no sign of active infection at this time which is also good news. She will require slight debridement around the right ankle region. 03/02/20-Patient returns at 1 week, has few new areas open on the left leg, especially 1 healed area on the medial ankle that has slight small open area now, to anterior leg areas that started out as blisters that are open now. She was using juxta lite compression to the left, she used Band-Aid to cover the vesicle that had ruptured on the left anterior leg, we are using 3 layer compression on the right. We are using silver alginate to the toe wound on the left. 03/08/2020 upon evaluation today patient unfortunately is doing much worse in regard to her right leg although her left leg looks like is doing better. Again we have taken her compression wraps off last week since she was doing well and transitioned her to her Velcro compression wrap. However apparently the patient is stating this was not put on here in the clinic and she never put it on at home. With that being said unfortunately she developed swelling and opened up new wounds which are present today she has quite a few of them in fact. 03/15/2020  on  evaluation today patient appears to be doing well with regard to her wounds on the lower extremities bilaterally. Fortunately there is no signs of active infection at this time. No fever chills noted. Overall I am actually very pleased with where things stand currently. 03/22/2020 on evaluation today patient appears to be doing fairly well in regard to her wounds in general. She just has really one area left on her toe everything else is healed on her legs. She does have her Velcro compression wraps which I think are appropriate at this point. Fortunately there is no signs of active infection at this time. No fevers, chills, nausea, vomiting, or diarrhea. 03/29/2020 on evaluation today patient appears to be doing poorly in regard to her right leg which is pretty much completely reopened compared to last week. She states it was okay until Saturday when she woke up she had blisters. With that being said it appears that this is likely stemming from the fact that the patient is apparently though I just found this out today going to sleep in her bed with her legs up on the bed with her but throughout the night she tends to end up with her legs hanging off the side or bottom of the bed on the floor. With that being said I think that this likely is happening much too long and then she has significant swelling the builds up as she sleeps. Obviously I think this is the reasoning behind what we are seeing currently. Objective Constitutional Well-nourished and well-hydrated in no acute distress. Vitals Time Taken: 10:07 AM, Height: 67 in, Weight: 202 lbs, BMI: 31.6, Temperature: 97.1 F, Pulse: 87 bpm, Respiratory Rate: 18 breaths/min, Blood Pressure: 120/67 mmHg. Respiratory normal breathing without difficulty. Psychiatric this patient is able to make decisions and demonstrates good insight into disease process. Alert and Oriented x 3. pleasant and cooperative. General Notes: Upon inspection patient's wounds  actually showed signs to be pretty enlarged on the right lower extremity compared to last week. Unfortunately I think that she needs to have something different to sleep on in order to keep her legs from falling off she states that she has an access to hospital bed which could potentially be helpful for her. With that being said I do question whether or not she has restless leg syndrome she may or may not I do not really know that maybe some of the discussed with her primary care provider. Integumentary (Hair, Skin) Wound #11R status is Open. Original cause of wound was Blister. The wound is located on the Right,Proximal,Medial Lower Leg. The wound measures 0.6cm length x 1.6cm width x 0.1cm depth; 0.754cm^2 area and 0.075cm^3 volume. There is no tunneling or undermining noted. There is a medium amount of serosanguineous drainage noted. The wound margin is distinct with the outline attached to the wound base. There is large (67-100%) red, pink granulation within the wound bed. There is no necrotic tissue within the wound bed. Wound #12R status is Open. Original cause of wound was Blister. The wound is located on the Right,Distal,Medial Lower Leg. The wound measures 1.5cm length x 1.6cm width x 0.1cm depth; 1.885cm^2 area and 0.188cm^3 volume. There is Fat Layer (Subcutaneous Tissue) exposed. There is no tunneling or undermining noted. There is a small amount of serous drainage noted. The wound margin is distinct with the outline attached to the wound base. There is large (67-100%) red granulation within the wound bed. There is no necrotic tissue within the wound bed. Wound #  13 status is Open. Original cause of wound was Gradually Appeared. The wound is located on the Right,Anterior Lower Leg. The wound measures 7cm length x 1.7cm width x 0.1cm depth; 9.346cm^2 area and 0.935cm^3 volume. There is Fat Layer (Subcutaneous Tissue) exposed. There is no tunneling or undermining noted. There is a medium  amount of serosanguineous drainage noted. The wound margin is distinct with the outline attached to the wound base. There is large (67-100%) pink, pale granulation within the wound bed. Wound #14 status is Open. Original cause of wound was Gradually Appeared. The wound is located on the Right,Medial Ankle. The wound measures 0.6cm length x 0.7cm width x 0.1cm depth; 0.33cm^2 area and 0.033cm^3 volume. There is Fat Layer (Subcutaneous Tissue) exposed. There is no tunneling or undermining noted. There is a medium amount of drainage noted. The wound margin is distinct with the outline attached to the wound base. There is medium (34-66%) pink granulation within the wound bed. There is a medium (34-66%) amount of necrotic tissue within the wound bed including Adherent Slough. Wound #8 status is Open. Original cause of wound was Blister. The wound is located on the Left T Third. The wound measures 1cm length x 1cm width x oe 0.1cm depth; 0.785cm^2 area and 0.079cm^3 volume. There is Fat Layer (Subcutaneous Tissue) exposed. There is no tunneling or undermining noted. There is a small amount of serous drainage noted. The wound margin is flat and intact. There is no granulation within the wound bed. There is a large (67-100%) amount of necrotic tissue within the wound bed including Eschar and Adherent Slough. 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 There was a Chemical/Enzymatic/Mechanical debridement performed by [name], RN.Marland Kitchen Agent used was [employer]. A time out was conducted at 10:55, prior to the start of the procedure. There was no bleeding. The procedure was tolerated well. Post Debridement Measurements: 1cm length x 1cm width x 0.1cm depth; 0.079cm^3 volume. Character of Wound/Ulcer Post Debridement requires further debridement. Severity of Tissue Post Debridement is: Fat layer exposed. Post procedure Diagnosis Wound #8: Same as Pre-Procedure Wound #11R Pre-procedure diagnosis of Wound #11R is a Diabetic Wound/Ulcer of the Lower Extremity located on the Right,Proximal,Medial Lower Leg . There was a Three Layer Compression Therapy Procedure by [name], RN. Post procedure Diagnosis Wound #11R: Same as Pre-Procedure There was a Three Layer Compression Therapy Procedure by [name], RN. Post procedure Diagnosis Wound #: Same as Pre-Procedure Plan Follow-up Appointments: Return Appointment in 1 week. Dressing Change Frequency: Do not change entire dressing for one week. - right legs wounds Wound #8 Left T Third: oe Change dressing every day. Skin Barriers/Peri-Wound Care: Moisturizing lotion - to leg Wound Cleansing: Wound #8 Left T Third: oe May shower and wash wound with soap and water. Primary Wound Dressing: Wound #11R Right,Proximal,Medial Lower Leg: Calcium Alginate with Silver Wound #12R Right,Distal,Medial Lower Leg: Calcium Alginate with Silver  Wound #13 Right,Anterior Lower Leg: Calcium Alginate with Silver Wound #14 Right,Medial Ankle: Calcium Alginate with Silver Wound #8 Left T Third: oe Santyl Ointment Secondary Dressing: Wound #11R Right,Proximal,Medial Lower Leg: Dry Gauze ABD pad Wound #12R Right,Distal,Medial Lower Leg: Dry Gauze ABD pad Wound #13 Right,Anterior Lower Leg: Dry  Gauze ABD pad Wound #14 Right,Medial Ankle: Dry Gauze ABD pad Wound #8 Left T Third: oe Kerlix/Rolled Gauze Dry Gauze Edema Control: 3 Layer Compression System - Bilateral Avoid standing for long periods of time Elevate legs to the level of the heart or above for 30 minutes daily and/or when sitting, a frequency of: - throughout the day Exercise regularly The following medication(s) was prescribed: Santyl topical 250 unit/gram ointment ointment topical Apply nickel thick daily to the wound bed and then cover with a dressing as directed in clinic starting 03/29/2020 1. I would recommend at this time that we go ahead and continue with the wound care measures as before with regard to the silver alginate to the open wound locations. 2. I am also can recommend that we going to start Santyl for the toe ulcer to try to help out with cleaning this up and hopefully prevent this from getting worse leading to another amputation. 3. I am also going to suggest that the patient should try to elevate her legs much as possible during the day and at nighttime she needs to do what she can to try to keep her legs on the bed otherwise she is good to have significant issues here in my opinion ongoing. We will see patient back for reevaluation in 1 week here in the clinic. If anything worsens or changes patient will contact our office for additional recommendations. Electronic Signature(s) Signed: 03/29/2020 11:11:08 AM By: [name] PA-C Entered By: [name] on 03/29/2020 11:11:08 -------------------------------------------------------------------------------- SuperBill Details Patient Name: Date of Service: [name], [place] [name] 03/29/2020 Medical Record Number: [id number] Patient Account Number: [id number] Date of Birth/Sex: Treating RN: 1950-12-03 (69 y.o. [name] Primary Care Provider: [name] RD, [name] Other Clinician: Referring Provider: Treating Provider/Extender: [name] RD, [name] Weeks in Treatment: 7 Diagnosis Coding ICD-10 Codes Code Description I87.2 Venous insufficiency (chronic) (peripheral) E11.621 Type 2 diabetes mellitus with foot ulcer L97.822 Non-pressure chronic ulcer of other part of left lower leg with fat layer exposed L97.312 Non-pressure chronic ulcer of right ankle with fat layer exposed L97.812 Non-pressure chronic ulcer of other part of right lower leg with fat layer exposed L97.512 Non-pressure chronic ulcer of other part of right foot with fat layer exposed N18.6 End stage renal disease Z99.2 Dependence on renal dialysis I10 Essential (primary) hypertension Facility Procedures CPT4: Code [id number] Description: 2 - [place]BR[place] W/O ANES NON SELECT Modifier: Quantity: 1 CPT4: [id number] foot Description: 1 BILATERAL: Application of multi-layer venous compression system; leg (below knee), including ankle and . Modifier: 59 Quantity: 1 Physician Procedures : CPT4 Code Description Modifier [id number] - WC PHYS LEVEL 4 - EST PT ICD-10 Diagnosis Description I87.2 Venous insufficiency (chronic) (peripheral) E11.621 Type 2 diabetes mellitus with foot ulcer L97.822 Non-pressure chronic ulcer of other part of  left lower leg with fat layer exposed L97.312 Non-pressure chronic ulcer of right ankle with fat layer exposed Quantity: 1 Electronic Signature(s) Signed: 03/29/2020 11:11:28 AM By: [name] PA-C Entered By: [name] on 03/29/2020 11:11:27

## 2020-03-30 DIAGNOSIS — T782XXA Anaphylactic shock, unspecified, initial encounter: Secondary | ICD-10-CM | POA: Insufficient documentation

## 2020-03-30 DIAGNOSIS — T7840XA Allergy, unspecified, initial encounter: Secondary | ICD-10-CM | POA: Insufficient documentation

## 2020-03-30 DIAGNOSIS — N2581 Secondary hyperparathyroidism of renal origin: Secondary | ICD-10-CM | POA: Diagnosis not present

## 2020-03-30 DIAGNOSIS — D689 Coagulation defect, unspecified: Secondary | ICD-10-CM | POA: Diagnosis not present

## 2020-03-30 DIAGNOSIS — D631 Anemia in chronic kidney disease: Secondary | ICD-10-CM | POA: Diagnosis not present

## 2020-03-30 DIAGNOSIS — Z992 Dependence on renal dialysis: Secondary | ICD-10-CM | POA: Diagnosis not present

## 2020-03-30 DIAGNOSIS — N186 End stage renal disease: Secondary | ICD-10-CM | POA: Diagnosis not present

## 2020-04-01 DIAGNOSIS — N186 End stage renal disease: Secondary | ICD-10-CM | POA: Diagnosis not present

## 2020-04-01 DIAGNOSIS — D631 Anemia in chronic kidney disease: Secondary | ICD-10-CM | POA: Diagnosis not present

## 2020-04-01 DIAGNOSIS — Z992 Dependence on renal dialysis: Secondary | ICD-10-CM | POA: Diagnosis not present

## 2020-04-01 DIAGNOSIS — N2581 Secondary hyperparathyroidism of renal origin: Secondary | ICD-10-CM | POA: Diagnosis not present

## 2020-04-01 DIAGNOSIS — D689 Coagulation defect, unspecified: Secondary | ICD-10-CM | POA: Diagnosis not present

## 2020-04-04 DIAGNOSIS — D631 Anemia in chronic kidney disease: Secondary | ICD-10-CM | POA: Diagnosis not present

## 2020-04-04 DIAGNOSIS — N186 End stage renal disease: Secondary | ICD-10-CM | POA: Diagnosis not present

## 2020-04-04 DIAGNOSIS — D689 Coagulation defect, unspecified: Secondary | ICD-10-CM | POA: Diagnosis not present

## 2020-04-04 DIAGNOSIS — N2581 Secondary hyperparathyroidism of renal origin: Secondary | ICD-10-CM | POA: Diagnosis not present

## 2020-04-04 DIAGNOSIS — Z992 Dependence on renal dialysis: Secondary | ICD-10-CM | POA: Diagnosis not present

## 2020-04-04 NOTE — Progress Notes (Signed)
[facility] Note  04/05/2020     CHIEF COMPLAINT Patient presents for Retina Follow Up   HISTORY OF PRESENT ILLNESS: [name] is a 69 y.o. female who presents to the clinic today for:   HPI    Retina Follow Up    Patient presents with  Diabetic Retinopathy.  In both eyes.  Duration of 8 weeks.  Since onset it is stable.  I, the attending physician,  performed the HPI with the patient and updated documentation appropriately.          Comments    Pt states her allergies are bothering her and making her vision worse, she denies fol or floaters, she uses AT's for dryness, but doesn't feel like they help, pt has been to the hospital this morning and has to go back for an x ray on her toe, she is on a new antibiotic for an infection       Last edited by [name], MD on 04/05/2020  1:26 PM. (History)    Pt states vision is improved since cataract surgery OD.  Patient states colors are brighter.  Referring physician: [name], MD [address] [place],  [place] [address]  HISTORICAL INFORMATION:   Selected notes from the MEDICAL RECORD NUMBER Referred by Dr. [name] for concern of BRVO OS LEE: 10.03.19 (C. Weaver) [BCVA: OD: 20/30 OS: 20/50] Ocular Hx-cataracts OU, HTR OU, NPDR OU, DES PMH-DM (M0Q: 7.3, takes Trulicity and [place]), HTN, HLD     CURRENT MEDICATIONS: Current Outpatient Medications (Ophthalmic Drugs)  Medication Sig  . Carboxymethylcellulose Sodium (THERATEARS OP) Place 1 drop into both eyes daily as needed (dry eyes).   No current facility-administered medications for this visit. (Ophthalmic Drugs)   Current Outpatient Medications (Other)  Medication Sig  . bisacodyl (BISACODYL) 5 MG EC tablet Take 10 mg by mouth daily as needed for moderate constipation.   . Blood Glucose Monitoring Suppl (ONE TOUCH ULTRA 2) w/Device KIT Use to check BS BID-QID Dx:E11.9  . BREO ELLIPTA 100-25 MCG/INH AEPB Inhale 1  puff by mouth once daily (Patient taking differently: Inhale 1 puff into the lungs daily. )  . diphenhydrAMINE (BENADRYL) 25 MG tablet Take 25 mg by mouth daily as needed for allergies.  Marland Kitchen gabapentin (NEURONTIN) 300 MG capsule TAKE 1 CAPSULE BY MOUTH THREE TIMES DAILY. REQUIRES OFFICE VISIT BEFORE ANY FURTHER REFILLS CAN BE GIVEN  . insulin degludec (TRESIBA FLEXTOUCH) 100 UNIT/ML FlexTouch Pen Inject 0.3 mLs (30 Units total) into the skin daily. Hold if fasting blood sugars are <130.  Marland Kitchen Insulin Pen Needle (LIVE BETTER PEN NEEDLES) 31G X 6 MM MISC To use with Tresiba pens daily  . Lancets (ONETOUCH ULTRASOFT) lancets Use as instructed  . lidocaine-prilocaine (EMLA) cream Apply 1 application topically as needed (port access).  . Methoxy PEG-Epoetin Beta (MIRCERA IJ) Mircera  . multivitamin (RENA-VIT) TABS tablet Take 1 tablet by mouth daily.  Glory Rosebush ULTRA test strip USE AS DIRECTED TO MONITOR  FSBS 3 TIMES DAILY  . oxyCODONE-acetaminophen (PERCOCET) 5-325 MG tablet Take 1 tablet by mouth every 6 (six) hours as needed for severe pain. (Patient not taking: Reported on 03/01/2020)  . PROAIR HFA 108 (90 Base) MCG/ACT inhaler Inhale 2 puffs into the lungs every 6 (six) hours as needed for wheezing or shortness of breath.  . rosuvastatin (CRESTOR) 20 MG tablet Take 1 tablet (20 mg total) by mouth daily.  . sevelamer carbonate (  RENVELA) 800 MG tablet Take 800 mg by mouth 2 (two) times daily with a meal.   Current Facility-Administered Medications (Other)  Medication Route  . Bevacizumab (AVASTIN) SOLN 1.25 mg Intravitreal  . Bevacizumab (AVASTIN) SOLN 1.25 mg Intravitreal  . Bevacizumab (AVASTIN) SOLN 1.25 mg Intravitreal  . Bevacizumab (AVASTIN) SOLN 1.25 mg Intravitreal  . Bevacizumab (AVASTIN) SOLN 1.25 mg Intravitreal      REVIEW OF SYSTEMS: ROS    Positive for: Endocrine, Cardiovascular, Eyes, Respiratory   Negative for: Constitutional, Gastrointestinal, Neurological, Skin,  Genitourinary, Musculoskeletal, HENT, Psychiatric, Allergic/Imm, Heme/Lymph   Last edited by Debbrah Alar, COT on 04/05/2020  1:23 PM. (History)       ALLERGIES Allergies  Allergen Reactions  . Penicillins Other (See Comments)    UNSPECIFIED CHILDHOOD REACTION  Has patient had a PCN reaction causing immediate rash, facial/tongue/throat swelling, SOB or lightheadedness with hypotension: Unknown Has patient had a PCN reaction causing severe rash involving mucus membranes or skin necrosis: Unknown Has patient had a PCN reaction that required hospitalization: Unknown Has patient had a PCN reaction occurring within the last 10 years: Unknown If all of the above answers are "NO", then may proceed with Cephalosporin use.   . Adhesive [Tape] Rash    PAST MEDICAL HISTORY Past Medical History:  Diagnosis Date  . Anemia   . Anemia associated with chronic renal failure   . Anemia in chronic kidney disease 09/29/2018  . Arthritis   . Asthma   . Blood transfusion without reported diagnosis    Phreesia 02/27/2020  . Cancer ([place])    Phreesia 02/27/2020  . Cataract    OD  . Chronic kidney disease    Phreesia 02/27/2020  . Coronary artery calcification seen on CAT scan 02/17/2018   Coronary calcification on CT  . Depression   . Diabetes mellitus without complication ([place])    Phreesia 02/27/2020  . Diabetic retinopathy of both eyes ([place])   . Diabetic ulcer of right foot associated with type 2 diabetes mellitus ([place])   . ESRD (end stage renal disease) on dialysis ([place])   . Essential hypertension 02/17/2018   Essential hypertension  . GERD (gastroesophageal reflux disease)    pt denies  . HLD (hyperlipidemia)   . Hypertension   . Hypertensive retinopathy    OU  . Left ventricular dysfunction 04/07/2018   Left ventricle dysfunction  . Microalbuminuria due to type 2 diabetes mellitus ([place])   . Neuromuscular disorder ([place])    diabetic neuropathy  . Nonproliferative retinopathy due to  secondary diabetes ([place])   . Nonsustained ventricular tachycardia ([place]) 08/28/2018   Nonsustained ventricular tachycardia  . Pneumonia    hx of   . Renal cell cancer ([place])   . Right renal mass 03/10/2017  . Type 2 diabetes, controlled, with neuropathy ([place]) 06/22/2013  . Ulcer of other part of foot 06/22/2013  . 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 AMPUTATION TOE Right 10/29/2019   Procedure: RIGHT 2ND TOE AMPUTATION;  Surgeon: Edrick Kins, DPM;  Location: [facility];  Service: Podiatry;  Laterality: Right;  . BASCILIC VEIN TRANSPOSITION Left 08/08/2017   Procedure: BASILIC VEIN TRANSPOSITION FIRST STAGE LEFT ARM;  Surgeon: Serafina Mitchell, MD;  Location: [facility];  Service: Vascular;  Laterality: Left;  . BASCILIC VEIN TRANSPOSITION Left 05/14/2018   Procedure: SECOND STAGE BASILIC VEIN TRANSPOSITION LEFT ARM;  Surgeon: Serafina Mitchell, MD;  Location: [facility]  OR;  Service: Vascular;  Laterality: Left;  . CATARACT EXTRACTION Left   . EYE SURGERY    . ROBOTIC ADRENALECTOMY Left 03/10/2017   Procedure: XI ROBOTIC ADRENALECTOMY;  Surgeon: Nickie Retort, MD;  Location: [facility];  Service: Urology;  Laterality: Left;  . ROBOTIC ASSITED PARTIAL NEPHRECTOMY Right 03/10/2017   Procedure: XI ROBOTIC ASSITED RADICAL NEPHRECTOMY;  Surgeon: Nickie Retort, MD;  Location: [facility];  Service: Urology;  Laterality: Right;  . TOE AMPUTATION Bilateral    both great toe ,, left foot 2nd toe 1/2    FAMILY HISTORY Family History  Problem Relation Age of Onset  . Heart disease Mother   . Diabetes Sister     SOCIAL HISTORY Social History   Tobacco Use  . Smoking status: Never Smoker  . Smokeless tobacco: Never Used  Vaping Use  . Vaping Use: Never used  Substance Use Topics  . Alcohol use: Yes    Alcohol/week: 0.0 standard drinks    Comment: occ  . Drug use: No         OPHTHALMIC EXAM:  Base Eye Exam    Visual  Acuity (Snellen - Linear)      Right Left   Dist [place] 20/20 -2 20/20 -2   Dist ph [place] 20/20 20/20       Tonometry (Tonopen, 1:29 PM)      Right Left   Pressure 09 10       Pupils      Dark Light Shape React APD   Right 3 2 Round Brisk None   Left 3 2 Round Brisk None       Visual Fields (Counting fingers)      Left Right    Full Full       Extraocular Movement      Right Left    Full, Ortho Full, Ortho       Neuro/Psych    Oriented x3: Yes   Mood/Affect: Normal       Dilation    Both eyes: 1.0% Mydriacyl, 2.5% Phenylephrine @ 1:30 PM        Slit Lamp and Fundus Exam    Slit Lamp Exam      Right Left   Lids/Lashes Dermatochalasis - upper lid, mild MGD Dermatochalasis - upper lid, Telangiectasia, mild MGD   Conjunctiva/Sclera White and quiet White and quiet   Cornea 1+ Punctate epithelial erosions, mild Arcus Well-healed temporal cataract wound   Anterior Chamber Deep and quiet Deep   Iris Round and moderately dilated to 5.59mm Round and well dilated   Lens PCIOL in good position PCIOL in good position   Vitreous Vitreous syneresis, Posterior vitreous detachment Vitreous syneresis       Fundus Exam      Right Left   Disc Pink and Sharp, m trace pallor, Sharp rim   C/D Ratio 0.3 0.3   Macula good foveal reflex, scattered MA and IRH greatest inferiorly, trace cystic changes inf/nasal macula Flat, good foveal reflex, RPE mottling and clumping; +cystic changes sup nasal macula with focal IRH   Vessels Vascular attenuation, Tortuous, AV crossing changes, blot heme along IT arcades Tortuous, Vascular attenuation, AV crossing changes   Periphery Attached, scattered IRH/DBH greatest posteriorly and small CWS nasal to disc - fading Attached, scattered DBH          IMAGING AND PROCEDURES  Imaging and Procedures for $RemoveBefore'@TODAY'yDbittmIhqpwb$ @  DG Foot Complete Left       CLINICAL DATA:  Diabetic  foot ulcer third toe  EXAM: LEFT FOOT - COMPLETE 3+ VIEW  COMPARISON:   None.  FINDINGS: Amputation of the first and second toes. No evidence of residual osteomyelitis in the first and second toe.  Third toe wound. There is irregularity of the third metatarsal phalangeal joint, with sclerotic margins. No prior studies to determine if this could be chronic.  Degenerative changes in the midfoot with spurring. Calcaneal spurring. Arterial calcification.  IMPRESSION: Irregularity of the third metatarsal phalangeal joint which could be due to acute or chronic septic arthritis or chronic degenerative change. Sclerotic margins are more consistent with a chronic process however the patient does have a wound in this area. No prior studies for comparison.   Electronically Signed   By: Franchot Gallo M.D.   On: 04/06/2020 16:13        OCT, Retina - OU - Both Eyes       Right Eye Quality was good. Central Foveal Thickness: 258. Progression has improved. Findings include normal foveal contour, no SRF, intraretinal fluid (Focal cystic changes inf nasal macula -- improved from prior).   Left Eye Quality was good. Central Foveal Thickness: 247. Progression has improved. Findings include normal foveal contour, intraretinal fluid, no SRF, vitreomacular adhesion , intraretinal hyper-reflective material (Persistent cystic changes sup nasal macula -- improved from prior).   Notes *Images captured and stored on drive  Diagnosis / Impression:  NFP; +IRF; no SRF OU noncentral DME OU (OS > OD) OD - Persistent cystic changes inf/nasal macula -- improved OS - Persistent cystic changes sup/nasal macula - improved  Clinical management:  See below  Abbreviations: NFP - Normal foveal profile. CME - cystoid macular edema. PED - pigment epithelial detachment. IRF - intraretinal fluid. SRF - subretinal fluid. EZ - ellipsoid zone. ERM - epiretinal membrane. ORA - outer retinal atrophy. ORT - outer retinal tubulation. SRHM - subretinal hyper-reflective material         Intravitreal Injection, Pharmacologic Agent - OS - Left Eye       Time Out 04/05/2020. 2:02 PM. Confirmed correct patient, procedure, site, and patient consented.   Anesthesia Topical anesthesia was used. Anesthetic medications included Lidocaine 2%, Proparacaine 0.5%.   Procedure Preparation included 5% betadine to ocular surface, eyelid speculum. A (32g) needle was used.   Injection:  1.25 mg Bevacizumab (AVASTIN) SOLN   NDC: 08676-195-09, Lot: [id number], Expiration date: 05/18/2020   Route: Intravitreal, Site: Left Eye, Waste: 0.05 mL  Post-op Post injection exam found visual acuity of at least counting fingers. The patient tolerated the procedure well. There were no complications. The patient received written and verbal post procedure care education.                 ASSESSMENT/PLAN:    ICD-10-CM   1. Moderate nonproliferative diabetic retinopathy of both eyes with macular edema associated with type 2 diabetes mellitus (HCC)  P80.9983 Intravitreal Injection, Pharmacologic Agent - OS - Left Eye    Bevacizumab (AVASTIN) SOLN 1.25 mg  2. Retinal edema  H35.81 OCT, Retina - OU - Both Eyes  3. Essential hypertension  I10   4. Hypertensive retinopathy of both eyes  H35.033   5. Pseudophakia of both eyes  Z96.1     1,2 Moderate non-proliferative diabetic retinopathy w/ DME OU (OS > OD)  - Initial exam shows scattered MA, BCVA 20/50 OS  - Initial FA (05/12/18) shows leaking MA, no NV OS  - Initial OCT showed diabetic macular edema OU (OS > OD)   -  s/p IVA #1 OS (10.22.19), #2 (11.20.19), #3 (12.20.19), #4 (01.31.20), #5 (03.13.20), #6 (04.22.20), #7 (06.05.20), #8 (07.17.20), #9 (08.28.20),#10 (10.07.20), #11 (12.04.20), #12 (02.21.21), #13 (05.12.21), #14 (07.21.21)  - today BCVA improved to 20/20 OU  - OCT - mild interval decrease in non-central cystic changes OU  - recommend IVA #15 OS today, 09.15.2021  - discussed possible need for IVA OD in the future  - pt wishes to  proceed IVA OS  - RBA of procedure discussed, questions answered  - informed consent obtained  - Avastin informed consent form signed and scanned on 12.04.2020  - see procedure note  - f/u in 8 wks, DFE, OCT, possible injxn(s) vs focal laser  3,4. Hypertensive retinopathy OU  - discussed importance of tight BP control  - monitor  5. Pseudophakia OU  - s/p CE/IOL OU w/ Dr. [name] (OS 2020;  OD 5.19.21)  - beautiful surgeries, doing well  - monitor   Ophthalmic Meds Ordered this visit:  Meds ordered this encounter  Medications  . Bevacizumab (AVASTIN) SOLN 1.25 mg       Return in about 8 weeks (around 05/31/2020) for DME - Dilated Exam, OCT, Possible Injxn.  There are no Patient Instructions on file for this visit.   Explained the diagnoses, plan, and follow up with the patient and they expressed understanding.  Patient expressed understanding of the importance of proper follow up care.   This document serves as a record of services personally performed by Gardiner Sleeper, MD, PhD. It was created on their behalf by Roselee Nova, COMT. The creation of this record is the provider's dictation and/or activities during the visit.  Electronically signed by: Roselee Nova, COMT 04/06/20 7:49 PM  Gardiner Sleeper, M.D., Ph.D. Diseases & Surgery of the Retina and Vitreous [facility]  I have reviewed the above documentation for accuracy and completeness, and I agree with the above. Gardiner Sleeper, M.D., Ph.D. 04/06/20 7:51 PM    Abbreviations: M myopia (nearsighted); A astigmatism; H hyperopia (farsighted); P presbyopia; Mrx spectacle prescription;  CTL contact lenses; OD right eye; OS left eye; OU both eyes  XT exotropia; ET esotropia; PEK punctate epithelial keratitis; PEE punctate epithelial erosions; DES dry eye syndrome; MGD meibomian gland dysfunction; ATs artificial tears; PFAT's preservative free artificial tears; [place] nuclear sclerotic cataract; PSC  posterior subcapsular cataract; ERM epi-retinal membrane; PVD posterior vitreous detachment; RD retinal detachment; DM diabetes mellitus; DR diabetic retinopathy; NPDR non-proliferative diabetic retinopathy; PDR proliferative diabetic retinopathy; CSME clinically significant macular edema; DME diabetic macular edema; dbh dot blot hemorrhages; CWS cotton wool spot; POAG primary open angle glaucoma; C/D cup-to-disc ratio; HVF humphrey visual field; GVF goldmann visual field; OCT optical coherence tomography; IOP intraocular pressure; BRVO Branch retinal vein occlusion; CRVO central retinal vein occlusion; CRAO central retinal artery occlusion; BRAO branch retinal artery occlusion; RT retinal tear; SB scleral buckle; PPV pars plana vitrectomy; VH Vitreous hemorrhage; PRP panretinal laser photocoagulation; IVK intravitreal kenalog; VMT vitreomacular traction; MH Macular hole;  NVD neovascularization of the disc; NVE neovascularization elsewhere; AREDS age related eye disease study; ARMD age related macular degeneration; POAG primary open angle glaucoma; EBMD epithelial/anterior basement membrane dystrophy; ACIOL anterior chamber intraocular lens; IOL intraocular lens; PCIOL posterior chamber intraocular lens; Phaco/IOL phacoemulsification with intraocular lens placement; [place] photorefractive keratectomy; LASIK laser assisted in situ keratomileusis; HTN hypertension; DM diabetes mellitus; COPD chronic obstructive pulmonary disease

## 2020-04-05 ENCOUNTER — Other Ambulatory Visit: Payer: Self-pay

## 2020-04-05 ENCOUNTER — Encounter (INDEPENDENT_AMBULATORY_CARE_PROVIDER_SITE_OTHER): Payer: Self-pay | Admitting: Ophthalmology

## 2020-04-05 ENCOUNTER — Ambulatory Visit (HOSPITAL_COMMUNITY)
Admission: RE | Admit: 2020-04-05 | Discharge: 2020-04-05 | Disposition: A | Discharge status: Home / Self Care | Payer: Medicare Other | Source: Ambulatory Visit | Attending: Family Medicine | Admitting: Family Medicine

## 2020-04-05 ENCOUNTER — Ambulatory Visit (INDEPENDENT_AMBULATORY_CARE_PROVIDER_SITE_OTHER): Payer: Medicare Other | Admitting: Ophthalmology

## 2020-04-05 ENCOUNTER — Encounter (HOSPITAL_BASED_OUTPATIENT_CLINIC_OR_DEPARTMENT_OTHER): Payer: Medicare Other | Admitting: Physician Assistant

## 2020-04-05 ENCOUNTER — Other Ambulatory Visit (HOSPITAL_COMMUNITY): Payer: Self-pay | Admitting: *Deleted

## 2020-04-05 DIAGNOSIS — I708 Atherosclerosis of other arteries: Secondary | ICD-10-CM | POA: Diagnosis not present

## 2020-04-05 DIAGNOSIS — Z992 Dependence on renal dialysis: Secondary | ICD-10-CM | POA: Diagnosis not present

## 2020-04-05 DIAGNOSIS — H3581 Retinal edema: Secondary | ICD-10-CM | POA: Diagnosis not present

## 2020-04-05 DIAGNOSIS — E13621 Other specified diabetes mellitus with foot ulcer: Secondary | ICD-10-CM

## 2020-04-05 DIAGNOSIS — E113313 Type 2 diabetes mellitus with moderate nonproliferative diabetic retinopathy with macular edema, bilateral: Secondary | ICD-10-CM | POA: Diagnosis not present

## 2020-04-05 DIAGNOSIS — E1122 Type 2 diabetes mellitus with diabetic chronic kidney disease: Secondary | ICD-10-CM | POA: Diagnosis not present

## 2020-04-05 DIAGNOSIS — I10 Essential (primary) hypertension: Secondary | ICD-10-CM | POA: Diagnosis not present

## 2020-04-05 DIAGNOSIS — E11622 Type 2 diabetes mellitus with other skin ulcer: Secondary | ICD-10-CM | POA: Diagnosis not present

## 2020-04-05 DIAGNOSIS — Z961 Presence of intraocular lens: Secondary | ICD-10-CM

## 2020-04-05 DIAGNOSIS — L97812 Non-pressure chronic ulcer of other part of right lower leg with fat layer exposed: Secondary | ICD-10-CM | POA: Diagnosis not present

## 2020-04-05 DIAGNOSIS — L97822 Non-pressure chronic ulcer of other part of left lower leg with fat layer exposed: Secondary | ICD-10-CM | POA: Diagnosis not present

## 2020-04-05 DIAGNOSIS — H35033 Hypertensive retinopathy, bilateral: Secondary | ICD-10-CM

## 2020-04-05 DIAGNOSIS — L97512 Non-pressure chronic ulcer of other part of right foot with fat layer exposed: Secondary | ICD-10-CM | POA: Diagnosis not present

## 2020-04-05 DIAGNOSIS — L97529 Non-pressure chronic ulcer of other part of left foot with unspecified severity: Secondary | ICD-10-CM | POA: Diagnosis not present

## 2020-04-05 DIAGNOSIS — I872 Venous insufficiency (chronic) (peripheral): Secondary | ICD-10-CM | POA: Diagnosis not present

## 2020-04-05 DIAGNOSIS — L97312 Non-pressure chronic ulcer of right ankle with fat layer exposed: Secondary | ICD-10-CM | POA: Diagnosis not present

## 2020-04-05 DIAGNOSIS — I120 Hypertensive chronic kidney disease with stage 5 chronic kidney disease or end stage renal disease: Secondary | ICD-10-CM | POA: Diagnosis not present

## 2020-04-05 DIAGNOSIS — N186 End stage renal disease: Secondary | ICD-10-CM | POA: Diagnosis not present

## 2020-04-05 DIAGNOSIS — E1140 Type 2 diabetes mellitus with diabetic neuropathy, unspecified: Secondary | ICD-10-CM | POA: Diagnosis not present

## 2020-04-05 DIAGNOSIS — M19072 Primary osteoarthritis, left ankle and foot: Secondary | ICD-10-CM | POA: Diagnosis not present

## 2020-04-05 DIAGNOSIS — E11621 Type 2 diabetes mellitus with foot ulcer: Secondary | ICD-10-CM | POA: Diagnosis not present

## 2020-04-05 NOTE — Progress Notes (Addendum)
[name], [name] ([id number]) Visit Report for 04/05/2020 Chief Complaint Document Details Patient Name: Date of Service: [name] [name], [name] [name] K. 04/05/2020 10:00 A M Medical Record Number: [id number] Patient Account Number: [id number] Date of Birth/Sex: Treating RN: [birth year] (69 y.o. [name] Primary Care Provider: [place], Hillery [name] Other Clinician: Referring Provider: Treating Provider/Extender: [name], [name] Weeks in Treatment: 8 Information Obtained from: Patient Chief Complaint Multiple LE Ulcers Electronic Signature(s) Signed: 04/05/2020 10:43:58 AM By: [name] PA-C Entered By: Worthy [name] on 04/05/2020 10:43:57 -------------------------------------------------------------------------------- Debridement Details Patient Name: Date of Service: [facility] [name], [place] [name] K. 04/05/2020 10:00 A M Medical Record Number: [id number] Patient Account Number: [id number] Date of Birth/Sex: Treating RN: 1950/11/20 (69 y.o. [name] Primary Care Provider: [place], Hillery [name] Other Clinician: Referring Provider: Treating Provider/Extender: [name], [name] Weeks in Treatment: 8 Debridement Performed for Assessment: Wound #8 Left T Third oe Performed By: Clinician [name] Gouty, RN Debridement Type: Chemical/Enzymatic/Mechanical Agent Used: Santyl Severity of Tissue Pre Debridement: Fat layer exposed Level of Consciousness (Pre-procedure): Awake and Alert Pre-procedure Verification/Time Out Yes - 11:22 Taken: Bleeding: None Response to Treatment: Procedure was tolerated well Level of Consciousness (Post- Awake and Alert procedure): Post Debridement Measurements of Total Wound Length: (cm) 1 Width: (cm) 1 Depth: (cm) 0.1 Volume: (cm) 0.079 Character of Wound/Ulcer Post Debridement: Requires Further Debridement Severity of Tissue Post Debridement: Fat layer exposed Post Procedure Diagnosis Same as Pre-procedure Electronic  Signature(s) Signed: 04/05/2020 6:07:28 PM By: [name] Gouty RN, BSN Signed: 04/05/2020 6:26:36 PM By: [name] PA-C Entered By: [name] on 04/05/2020 11:22:41 -------------------------------------------------------------------------------- HPI Details Patient Name: Date of Service: [name], [name] [name] K. 04/05/2020 10:00 A M Medical Record Number: [id number] Patient Account Number: [id number] Date of Birth/Sex: Treating RN: [birth year] (69 y.o. [name] Primary Care Provider: [name] [name], Hillery [name] Other Clinician: Referring Provider: Treating Provider/Extender: [name], [name] Weeks in Treatment: 8 History of Present Illness HPI Description: Evaluate7/21/2021 today patient presents for evaluation of ulcerations on her legs which she tells me tend to come and go as far as small blistering locations and sometimes are better than other times. Right now she tells me that this is actually doing a little bit better but nonetheless will not completely go away. She has ulcerations bilaterally on her lower extremities. The right is worse than the left. 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 The patient has no evidence of active infection at this time. She however appears to have proficient blood flow into the extremities and I think would tolerate a 3 layer compression wrap she was noncompressible on arterial studies. Nonetheless there was no obvious signs of occlusion. 02/16/2020 on evaluation today patient appears to be doing much better in regard to her wounds in general. On the past week she has made significant improvements which is great news there is no signs of active infection at this time. No fevers, chills, nausea, vomiting, or diarrhea. 02/23/2020 on evaluation today patient appears to be doing well in regard to her lower extremity ulcers and  ankle ulcers. Overall I feel like she is making great progress and in general I am extremely pleased with how things stand. There is no sign of active infection at this time which is also good news. She will require slight debridement around  the right ankle region. 03/02/20-Patient returns at 1 week, has few new areas open on the left leg, especially 1 healed area on the medial ankle that has slight small open area now, to anterior leg areas that started out as blisters that are open now. She was using juxta lite compression to the left, she used Band-Aid to cover the vesicle that had ruptured on the left anterior leg, we are using 3 layer compression on the right. We are using silver alginate to the toe wound on the left. 03/08/2020 upon evaluation today patient unfortunately is doing much worse in regard to her right leg although her left leg looks like is doing better. Again we have taken her compression wraps off last week since she was doing well and transitioned her to her Velcro compression wrap. However apparently the patient is stating this was not put on here in the clinic and she never put it on at home. With that being said unfortunately she developed swelling and opened up new wounds which are present today she has quite a few of them in fact. 03/15/2020 on evaluation today patient appears to be doing well with regard to her wounds on the lower extremities bilaterally. Fortunately there is no signs of active infection at this time. No fever chills noted. Overall I am actually very pleased with where things stand currently. 03/22/2020 on evaluation today patient appears to be doing fairly well in regard to her wounds in general. She just has really one area left on her toe everything else is healed on her legs. She does have her Velcro compression wraps which I think are appropriate at this point. Fortunately there is no signs of active infection at this time. No fevers, chills, nausea,  vomiting, or diarrhea. 03/29/2020 on evaluation today patient appears to be doing poorly in regard to her right leg which is pretty much completely reopened compared to last week. She states it was okay until Saturday when she woke up she had blisters. With that being said it appears that this is likely stemming from the fact that the patient is apparently though I just found this out today going to sleep in her bed with her legs up on the bed with her but throughout the night she tends to end up with her legs hanging off the side or bottom of the bed on the floor. With that being said I think that this likely is happening much too long and then she has significant swelling the builds up as she sleeps. Obviously I think this is the reasoning behind what we are seeing currently. 04/05/2020 on evaluation today patient appears to be doing quite well with regard to her leg ulcers at this point. Fortunately there is no signs of anything significantly open again which is great news nonetheless I am to continue wrapping her for at least a couple of weeks due to the fact that she seems to continually reopen as soon as we unwrapped her. Electronic Signature(s) Signed: 04/05/2020 11:26:41 AM By: [name] PA-C Previous Signature: 04/05/2020 11:16:39 AM Version By: [name] PA-C Previous Signature: 04/05/2020 11:15:41 AM Version By: [name] PA-C Previous Signature: 04/05/2020 10:44:01 AM Version By: [name] PA-C Entered By: Worthy [name] on 04/05/2020 11:26:40 -------------------------------------------------------------------------------- Physical Exam Details Patient Name: Date of Service: [name], [place] [name] K. 04/05/2020 10:00 A M Medical Record Number: [id number] Patient Account Number: [id number] Date of Birth/Sex: Treating RN: Jan 17, 1951 (69 y.o. F) [name]  Primary Care Provider: [name] [name], [name] RREN Other Clinician: Referring Provider: Treating Provider/Extender: [name], [name] Weeks in Treatment: 8 Constitutional Well-nourished and well-hydrated in no acute distress. Respiratory normal breathing without difficulty. Psychiatric this patient is able to make decisions and demonstrates good insight into disease process. Alert and Oriented x 3. pleasant and cooperative. Notes Upon inspection patient's wound again the main thing open at this point is on the left third toe which unfortunately still appears to be very erythematous. I would go ahead and place her on an antibiotic I cannot really culture anything there is nothing that would yield good results. For that reason we will get a try attempt to Bactrim DS to see how this will help. Electronic Signature(s) Signed: 04/05/2020 11:27:08 AM By: [name] PA-C Previous Signature: 04/05/2020 11:17:02 AM Version By: [name] PA-C Previous Signature: 04/05/2020 11:16:04 AM Version By: [name] PA-C Entered By: Worthy [name] on 04/05/2020 11:27:07 -------------------------------------------------------------------------------- Physician Orders Details Patient Name: Date of Service: [facility] [name], [place] [name] K. 04/05/2020 10:00 A M Medical Record Number: [id number] Patient Account Number: [id number] Date of Birth/Sex: Treating RN: 06-05-1951 (69 y.o. [name] Primary Care Provider: [name] [name], Hill[place] [name] Other Clinician: Referring Provider: Treating Provider/Extender: [name], [name] Weeks in Treatment: 8 Verbal / Phone Orders: No Diagnosis Coding ICD-10 Coding Code Description I87.2 Venous insufficiency (chronic) (peripheral) E11.621 Type 2 diabetes mellitus with foot ulcer L97.822 Non-pressure chronic ulcer of other part of left lower leg with fat layer exposed L97.312 Non-pressure chronic ulcer of right ankle with fat layer exposed L97.812 Non-pressure chronic ulcer of other part of right lower leg with fat layer exposed L97.512 Non-pressure chronic ulcer of  other part of right foot with fat layer exposed N18.6 End stage renal disease Z99.2 Dependence on renal dialysis I10 Essential (primary) hypertension Follow-up Appointments Return Appointment in 1 week. Dressing Change Frequency Wound #14 Right,Medial Ankle Do not change entire dressing for one week. Wound #8 Left T Third oe Change dressing every day. Skin Barriers/Peri-Wound Care Moisturizing lotion - to legs Wound Cleansing Wound #8 Left T Third oe Clean wound with Normal Saline. May shower with protection. Primary Wound Dressing Wound #14 Right,Medial Ankle Calcium Alginate with Silver Wound #8 Left T Third oe Santyl Ointment Secondary Dressing Wound #14 Right,Medial Ankle Dry Gauze Wound #8 Left T Third oe Kerlix/Rolled Gauze Dry Gauze Edema Control 3 Layer Compression System - Bilateral Avoid standing for long periods of time Elevate legs to the level of the heart or above for 30 minutes daily and/or when sitting, a frequency of: - throughout the day Exercise regularly Radiology X-ray, foot complete view left - diabetic foot ulcer left 3rd toe CPT 73630 - (ICD10 E11.621 - Type 2 diabetes mellitus with foot ulcer) Patient Medications llergies: penicillin, adhesive tape A Notifications Medication Indication Start End 04/05/2020 Bactrim DS DOSE 1 - oral 800 mg-160 mg tablet - 1 tablet oral taken 2 times per day for 14 days Electronic Signature(s) Signed: 04/05/2020 11:31:48 AM By: [name] PA-C Entered By: Worthy [name] on 04/05/2020 11:31:47 Prescription 04/05/2020 -------------------------------------------------------------------------------- Rolan Lipa K. Worthy [name] [place] Patient Name: Provider: 1951/05/13 [id number] Date of Birth: NPI#: [id number] Sex: DEA #: [phone] Phone #: License #: [place] Patient Address: [address] [place], [place] [address] [place], [place] [address] [phone] Allergies penicillin;  adhesive tape Provider's Orders X-ray, foot complete view left - ICD10: E11.621 - diabetic foot ulcer left 3rd toe CPT 97353 Hand Signature: Date(s): Electronic Signature(s) Signed: 04/05/2020 6:26:36 PM By: [name] PA-C Entered By: Worthy [name] on 04/05/2020 11:31:49 -------------------------------------------------------------------------------- Problem List Details Patient Name: Date of Service: [facility] [name], [place] [name] K. 04/05/2020 10:00 A M Medical Record Number: [id number] Patient Account Number: [id number] Date of Birth/Sex: Treating RN: 18-Jun-1951 (69 y.o. [name] Primary Care Provider: [name] [name], [name] Other Clinician: Referring Provider: Treating Provider/Extender: [name], [name] Weeks in Treatment: 8 Active Problems ICD-10 Encounter Code Description Active Date MDM Diagnosis I87.2 Venous insufficiency (chronic) (peripheral) 02/09/2020 No Yes E11.621 Type 2 diabetes mellitus with foot ulcer 02/09/2020 No Yes L97.822 Non-pressure chronic ulcer of other part of left lower leg with fat layer exposed7/21/2021 No Yes L97.312 Non-pressure chronic ulcer of right ankle with fat layer exposed 02/09/2020 No Yes L97.812 Non-pressure chronic ulcer of other part of right lower leg with fat layer 02/09/2020 No Yes exposed L97.512 Non-pressure chronic ulcer of other part of right foot with fat layer exposed 02/09/2020 No Yes N18.6 End stage renal disease 02/09/2020 No Yes Z99.2 Dependence on renal dialysis 02/09/2020 No Yes I10 Essential (primary) hypertension 02/09/2020 No Yes Inactive Problems Resolved Problems Electronic Signature(s) Signed: 04/05/2020 10:43:52 AM By: [name] PA-C Entered By: Worthy [name] on 04/05/2020 10:43:51 -------------------------------------------------------------------------------- Progress Note Details Patient Name: Date of Service: [facility] [name], [place] [name] K.  04/05/2020 10:00 A M Medical Record Number: [id number] Patient Account Number: [id number] Date of Birth/Sex: Treating RN: [birth year] (69 y.o. [name] Primary Care Provider: [name] [name], Hill[place] [name] Other Clinician: Referring Provider: Treating Provider/Extender: [name], [name] Weeks in Treatment: 8 Subjective Chief Complaint Information obtained from Patient Multiple LE Ulcers History of Present Illness (HPI) Evaluate7/21/2021 today patient presents for evaluation of ulcerations on her legs which she tells me tend to come and go as far as small blistering locations and sometimes are better than other times. Right now she tells me that this is actually doing a little bit better but nonetheless will not completely go away. She has ulcerations bilaterally on her lower extremities. The right is worse than the left. 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 The patient has no evidence of active infection at this time. She however appears to have proficient blood flow into the extremities and I think would tolerate a 3 layer compression wrap she was noncompressible on arterial studies. Nonetheless there was no obvious signs of occlusion. 02/16/2020 on evaluation today patient appears to be doing much better in regard to her wounds in general. On the past week she has made significant improvements which is great news there is no signs of active infection at this time. No fevers, chills, nausea, vomiting, or diarrhea. 02/23/2020 on evaluation today patient appears to be doing well in regard to her lower extremity ulcers and ankle ulcers. Overall I feel like she is making great progress and in general I am extremely pleased with how things stand. There is no sign of active infection at this time which is also good news. She will require slight debridement around the  right ankle region. 03/02/20-Patient returns at 1 week, has few new areas open on the left leg, especially 1 healed area on the medial ankle that has slight small  open area now, to anterior leg areas that started out as blisters that are open now. She was using juxta lite compression to the left, she used Band-Aid to cover the vesicle that had ruptured on the left anterior leg, we are using 3 layer compression on the right. We are using silver alginate to the toe wound on the left. 03/08/2020 upon evaluation today patient unfortunately is doing much worse in regard to her right leg although her left leg looks like is doing better. Again we have taken her compression wraps off last week since she was doing well and transitioned her to her Velcro compression wrap. However apparently the patient is stating this was not put on here in the clinic and she never put it on at home. With that being said unfortunately she developed swelling and opened up new wounds which are present today she has quite a few of them in fact. 03/15/2020 on evaluation today patient appears to be doing well with regard to her wounds on the lower extremities bilaterally. Fortunately there is no signs of active infection at this time. No fever chills noted. Overall I am actually very pleased with where things stand currently. 03/22/2020 on evaluation today patient appears to be doing fairly well in regard to her wounds in general. She just has really one area left on her toe everything else is healed on her legs. She does have her Velcro compression wraps which I think are appropriate at this point. Fortunately there is no signs of active infection at this time. No fevers, chills, nausea, vomiting, or diarrhea. 03/29/2020 on evaluation today patient appears to be doing poorly in regard to her right leg which is pretty much completely reopened compared to last week. She states it was okay until Saturday when she woke up she had blisters.  With that being said it appears that this is likely stemming from the fact that the patient is apparently though I just found this out today going to sleep in her bed with her legs up on the bed with her but throughout the night she tends to end up with her legs hanging off the side or bottom of the bed on the floor. With that being said I think that this likely is happening much too long and then she has significant swelling the builds up as she sleeps. Obviously I think this is the reasoning behind what we are seeing currently. 04/05/2020 on evaluation today patient appears to be doing quite well with regard to her leg ulcers at this point. Fortunately there is no signs of anything significantly open again which is great news nonetheless I am to continue wrapping her for at least a couple of weeks due to the fact that she seems to continually reopen as soon as we unwrapped her. Objective Constitutional Well-nourished and well-hydrated in no acute distress. Vitals Time Taken: 10:47 AM, Height: 67 in, Weight: 202 lbs, BMI: 31.6, Temperature: 98.4 F, Pulse: 85 bpm, Respiratory Rate: 18 breaths/min, Blood Pressure: 128/71 mmHg. Respiratory normal breathing without difficulty. Psychiatric this patient is able to make decisions and demonstrates good insight into disease process. Alert and Oriented x 3. pleasant and cooperative. General Notes: Upon inspection patient's wound again the main thing open at this point is on the left third toe which unfortunately still appears to be very erythematous. I would go ahead and place her on an antibiotic I cannot really culture anything there is nothing that would yield good results. For that reason we  will get a try attempt to Bactrim DS to see how this will help. Integumentary (Hair, Skin) Wound #11R status is Healed - Epithelialized. Original cause of wound was Blister. The wound is located on the Right,Proximal,Medial Lower Leg. The wound measures 0cm  length x 0cm width x 0cm depth; 0cm^2 area and 0cm^3 volume. Wound #12R status is Healed - Epithelialized. Original cause of wound was Blister. The wound is located on the Right,Distal,Medial Lower Leg. The wound measures 0cm length x 0cm width x 0cm depth; 0cm^2 area and 0cm^3 volume. Wound #13 status is Healed - Epithelialized. Original cause of wound was Gradually Appeared. The wound is located on the Right,Anterior Lower Leg. The wound measures 0cm length x 0cm width x 0cm depth; 0cm^2 area and 0cm^3 volume. Wound #14 status is Open. Original cause of wound was Gradually Appeared. The wound is located on the Right,Medial Ankle. The wound measures 0.1cm length x 0.1cm width x 0.1cm depth; 0.008cm^2 area and 0.001cm^3 volume. There is Fat Layer (Subcutaneous Tissue) exposed. There is no tunneling or undermining noted. There is a small amount of serosanguineous drainage noted. The wound margin is distinct with the outline attached to the wound base. There is large (67-100%) red, pink granulation within the wound bed. There is a small (1-33%) amount of necrotic tissue within the wound bed including Adherent Slough. General Notes: scabbed area noted. Wound #8 status is Open. Original cause of wound was Blister. The wound is located on the Left T Third. The wound measures 1cm length x 1cm width x oe 0.1cm depth; 0.785cm^2 area and 0.079cm^3 volume. There is Fat Layer (Subcutaneous Tissue) exposed. There is no tunneling or undermining noted. There is a small amount of serous drainage noted. The wound margin is flat and intact. There is no granulation within the wound bed. There is a large (67-100%) amount of necrotic tissue within the wound bed including Adherent Slough. General Notes: periwound redness. 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 There was a Chemical/Enzymatic/Mechanical debridement performed by [name] Gouty, RN.Marland Kitchen Agent used was [employer]. A time out was conducted at 11:22, prior to the start of the procedure. There was no bleeding. The procedure was tolerated well. Post Debridement Measurements: 1cm length x 1cm width x 0.1cm depth; 0.079cm^3 volume. Character of Wound/Ulcer Post Debridement requires further debridement. Severity of Tissue Post Debridement is: Fat layer exposed. Post procedure Diagnosis Wound #8: Same as Pre-Procedure Wound #14 Pre-procedure diagnosis of Wound #14 is a Diabetic Wound/Ulcer of the Lower Extremity located on the Right,Medial Ankle . There was a Three Layer Compression Therapy Procedure by [name], RN. Post procedure Diagnosis Wound #14: Same as Pre-Procedure There was a Three Layer Compression Therapy Procedure by [name], RN. Post procedure Diagnosis Wound #: Same as Pre-Procedure Plan Follow-up Appointments: Return Appointment in 1 week. Dressing Change Frequency: Wound #14 Right,Medial Ankle: Do not change entire dressing for one  week. Wound #8 Left T Third: oe Change dressing every day. Skin Barriers/Peri-Wound Care: Moisturizing lotion - to legs Wound Cleansing: Wound #8 Left T Third: oe Clean wound with Normal Saline. May shower with protection. Primary Wound Dressing: Wound #14 Right,Medial Ankle: Calcium Alginate with Silver Wound #8 Left T  Third: oe Santyl Ointment Secondary Dressing: Wound #14 Right,Medial Ankle: Dry Gauze Wound #8 Left T Third: oe Kerlix/Rolled Gauze Dry Gauze Edema Control: 3 Layer Compression System - Bilateral Avoid standing for long periods of time Elevate legs to the level of the heart or above for 30 minutes daily and/or when sitting, a frequency of: - throughout the day Exercise regularly Radiology ordered were: X-ray, foot complete view left - diabetic foot ulcer left 3rd toe CPT 73630 The following medication(s) was prescribed: Bactrim DS oral 800 mg-160 mg tablet 1 1 tablet oral taken 2 times per day for 14 days starting 04/05/2020 1. I would recommend currently that we actually go ahead and continue with the Santyl for the toe I think that skin to be the best way to go. 2. I am also can recommend at this time that we continue with the alginate for any open areas that may be draining on the legs in order to keep this dry and were also going to continue with the compression wrap currently we have been utilizing a 3 layer compression wrap. 3. I would recommend the patient continue to elevate her legs much as possible. 4. I would recommend an x-ray of the left foot to look at the third toe specifically to evaluate for any signs of osteomyelitis or otherwise. We will see patient back for reevaluation in 1 week here in the clinic. If anything worsens or changes patient will contact our office for additional recommendations. Electronic Signature(s) Signed: 04/05/2020 11:32:02 AM By: [name] PA-C Entered By: Worthy [name] on 04/05/2020 11:32:01 -------------------------------------------------------------------------------- SuperBill Details Patient Name: Date of Service: [name], [place] [name] K. 04/05/2020 Medical Record Number: [id number] Patient Account Number: [id number] Date of Birth/Sex: Treating RN: 1950/11/22 (69 y.o. 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 Modifier: 59 Quantity: 1 Physician Procedures : CPT4 Code Description Modifier [id number] - WC PHYS LEVEL 4 - EST PT ICD-10 Diagnosis Description I87.2 Venous insufficiency (chronic) (peripheral) E11.621 Type 2 diabetes mellitus with foot ulcer L97.822 Non-pressure chronic ulcer of other part of  left lower leg with fat layer exposed L97.312 Non-pressure chronic ulcer of right ankle with fat layer exposed Quantity: 1 Electronic Signature(s) Signed: 04/05/2020 11:32:29 AM By: [name] PA-C Previous Signature: 04/05/2020 11:32:18 AM Version By: [name] PA-C Entered By: Melburn Hake,  Kailyn Dubie on 04/05/2020 11:32:29

## 2020-04-06 DIAGNOSIS — Z992 Dependence on renal dialysis: Secondary | ICD-10-CM | POA: Diagnosis not present

## 2020-04-06 DIAGNOSIS — E113313 Type 2 diabetes mellitus with moderate nonproliferative diabetic retinopathy with macular edema, bilateral: Secondary | ICD-10-CM

## 2020-04-06 DIAGNOSIS — N2581 Secondary hyperparathyroidism of renal origin: Secondary | ICD-10-CM | POA: Diagnosis not present

## 2020-04-06 DIAGNOSIS — N186 End stage renal disease: Secondary | ICD-10-CM | POA: Diagnosis not present

## 2020-04-06 DIAGNOSIS — D689 Coagulation defect, unspecified: Secondary | ICD-10-CM | POA: Diagnosis not present

## 2020-04-06 DIAGNOSIS — D631 Anemia in chronic kidney disease: Secondary | ICD-10-CM | POA: Diagnosis not present

## 2020-04-06 DIAGNOSIS — Z961 Presence of intraocular lens: Secondary | ICD-10-CM | POA: Diagnosis not present

## 2020-04-06 MED ORDER — BEVACIZUMAB CHEMO INJECTION 1.25MG/0.05ML SYRINGE FOR KALEIDOSCOPE
1.2500 mg | INTRAVITREAL | Status: AC | PRN
  Administered 2020-04-06: 1.25 mg via INTRAVITREAL

## 2020-04-08 DIAGNOSIS — N186 End stage renal disease: Secondary | ICD-10-CM | POA: Diagnosis not present

## 2020-04-08 DIAGNOSIS — D689 Coagulation defect, unspecified: Secondary | ICD-10-CM | POA: Diagnosis not present

## 2020-04-08 DIAGNOSIS — D631 Anemia in chronic kidney disease: Secondary | ICD-10-CM | POA: Diagnosis not present

## 2020-04-08 DIAGNOSIS — N2581 Secondary hyperparathyroidism of renal origin: Secondary | ICD-10-CM | POA: Diagnosis not present

## 2020-04-08 DIAGNOSIS — Z992 Dependence on renal dialysis: Secondary | ICD-10-CM | POA: Diagnosis not present

## 2020-04-10 NOTE — Progress Notes (Signed)
[name], [name] ([id number]) Visit Report for 04/05/2020 Arrival Information Details Patient Name: Date of Service: [name] [name], [name] [place] [name] 04/05/2020 10:00 A M Medical Record Number: [id number] Patient Account Number: [id number] Date of Birth/Sex: Treating RN: 21-Jun-1951 (69 y.o. [name] Primary Care Zoii Florer: [name] RD, [name] Other Clinician: Referring Francella Barnett: Treating [name]/Extender: [name] RD, [name] Weeks in Treatment: 8 Visit Information History Since Last Visit Added or deleted any medications: No Patient Arrived: Wheel Chair Any [name] allergies or adverse reactions: No Arrival Time: 10:45 Had a fall or experienced change in No Accompanied By: self activities of daily living that may affect Transfer Assistance: None risk of falls: Patient Identification Verified: Yes Signs or symptoms of abuse/neglect since last visito No Secondary Verification Process Completed: Yes Hospitalized since last visit: No Patient Requires Transmission-Based Precautions: No Implantable device outside of the clinic excluding No Patient Has Alerts: Yes cellular tissue based products placed in the center Patient Alerts: Right ABI: [name] since last visit: Left ABI: [name] Has Dressing in Place as Prescribed: Yes Pain Present Now: No Electronic Signature(s) Signed: 04/06/2020 11:12:08 AM By: Sandre Kitty Entered By: Sandre Kitty on 04/05/2020 10:46:01 -------------------------------------------------------------------------------- Compression Therapy Details Patient Name: Date of Service: [name], [name] [name] 04/05/2020 10:00 A M Medical Record Number: [id number] Patient Account Number: [id number] Date of Birth/Sex: Treating RN: 01-09-1951 (69 y.o. [name] Primary Care Kushal Saunders: [name] RD, [name] Other Clinician: Referring Cantrell Larouche: Treating Zehava Turski/Extender: [name] RD, [name] Weeks in Treatment: 8 Compression Therapy Performed for Wound  Assessment: Wound #14 Right,Medial Ankle Performed By: [name], RN Compression Type: Three Layer Post Procedure Diagnosis Same as Pre-procedure Electronic Signature(s) Signed: 04/05/2020 6:07:28 PM By: [name] RN, BSN Entered By: [name] on 04/05/2020 11:23:11 -------------------------------------------------------------------------------- Compression Therapy Details Patient Name: Date of Service: [name] [name], [name] [name] 04/05/2020 10:00 A M Medical Record Number: [id number] Patient Account Number: [id number] Date of Birth/Sex: Treating RN: July 07, 1951 (69 y.o. [name] Primary Care Keithon Mccoin: [name] RD, [name] Other Clinician: Referring Sayeed Weatherall: Treating Filimon Miranda/Extender: [name] RD, [name] Weeks in Treatment: 8 Compression Therapy Performed for Wound Assessment: NonWound Condition Lymphedema - Left Leg Performed By: Clinician [name], RN Compression Type: Three Layer Post Procedure Diagnosis Same as Pre-procedure Electronic Signature(s) Signed: 04/05/2020 6:07:28 PM By: [name] RN, BSN Entered By: [name] on 04/05/2020 11:24:13 -------------------------------------------------------------------------------- Encounter Discharge Information Details Patient Name: Date of Service: [name] [name], [name] [name] 04/05/2020 10:00 A M Medical Record Number: [id number] Patient Account Number: [id number] Date of Birth/Sex: Treating RN: 1951/05/26 (69 y.o. [name] Primary Care Edmar Blankenburg: [name] RD, [name] Other Clinician: Referring Rilei Kravitz: Treating [name]/Extender: [name] RD, [name] Weeks in Treatment: 8 Encounter Discharge Information Items Post Procedure Vitals Discharge Condition: Stable Temperature (F): 98.4 Ambulatory Status: Ambulatory Pulse (bpm): 85 Discharge Destination: Home Respiratory Rate (breaths/min): 18 Transportation: Private Auto Blood Pressure (mmHg): 128/71 Accompanied By:  self Schedule Follow-up Appointment: Yes Clinical Summary of Care: Patient Declined Electronic Signature(s) Signed: 04/10/2020 1:15:48 PM By: [name] RN Entered By: [name] on 04/05/2020 11:59:18 -------------------------------------------------------------------------------- Lower Extremity Assessment Details Patient Name: Date of Service: [name] [name], [name] [place] [name] 04/05/2020 10:00 A M Medical Record Number: [id number] Patient Account Number: [id number] Date of Birth/Sex: Treating RN: [birth year] (69 y.o. [name] Primary Care Duquan Gillooly: [place], [name] Other Clinician: Referring Rita Vialpando: Treating [place] Wank/Extender: [name] RD, [place]  RREN Weeks in Treatment: 8 Edema Assessment Assessed: [Left: No] [Right: No] Edema: [Left: Yes] [Right: Yes] Calf Left: Right: Point of Measurement: 38 cm From Medial Instep 35.5 cm 34 cm Ankle Left: Right: Point of Measurement: 13 cm From Medial Instep 23 cm 24 cm Vascular Assessment Pulses: Dorsalis Pedis Palpable: [Left:Yes] [Right:Yes] Electronic Signature(s) Signed: 04/05/2020 5:18:19 PM By: [name] Entered By: [name] on 04/05/2020 10:52:04 -------------------------------------------------------------------------------- [name] Details Patient Name: Date of Service: [name] [name], [name] [name] 04/05/2020 10:00 A M Medical Record Number: [id number] Patient Account Number: [id number] Date of Birth/Sex: Treating RN: [birth year] (69 y.o. [name] Primary Care Brynne Doane: [name] RD, [name] Other Clinician: Referring Delois Tolbert: Treating Veron Senner/Extender: [name] RD, [name] Weeks in Treatment: 8 Active Inactive Venous Leg Ulcer Nursing Diagnoses: Knowledge deficit related to disease process and management Potential for venous Insuffiency (use before diagnosis confirmed) Goals: Patient will maintain optimal edema control Date Initiated: 02/09/2020 Target Resolution Date:  05/03/2020 Goal Status: Active Patient/caregiver will verbalize understanding of disease process and disease management Date Initiated: 02/09/2020 Date Inactivated: 03/02/2020 Target Resolution Date: 03/08/2020 Goal Status: Met Interventions: Assess peripheral edema status every visit. Compression as ordered Provide education on venous insufficiency Treatment Activities: Therapeutic compression applied : 02/09/2020 Notes: Wound/Skin Impairment Nursing Diagnoses: Impaired tissue integrity Knowledge deficit related to ulceration/compromised skin integrity Goals: Patient/caregiver will verbalize understanding of skin care regimen Date Initiated: 02/09/2020 Target Resolution Date: 05/03/2020 Goal Status: Active Ulcer/skin breakdown will have a volume reduction of 30% by week 4 Date Initiated: 02/09/2020 Date Inactivated: 03/02/2020 Target Resolution Date: 03/08/2020 Goal Status: Met Interventions: Assess patient/caregiver ability to obtain necessary supplies Assess patient/caregiver ability to perform ulcer/skin care regimen upon admission and as needed Assess ulceration(s) every visit Provide education on ulcer and skin care Treatment Activities: Skin care regimen initiated : 02/09/2020 Topical wound management initiated : 02/09/2020 Notes: Electronic Signature(s) Signed: 04/05/2020 6:07:28 PM By: [name] RN, BSN Entered By: [name] on 04/05/2020 11:17:18 -------------------------------------------------------------------------------- Pain Assessment Details Patient Name: Date of Service: [name], [place] [name] 04/05/2020 10:00 A M Medical Record Number: [id number] Patient Account Number: [id number] Date of Birth/Sex: Treating RN: [birth year] (69 y.o. [name] Primary Care Oneta Sigman: [place], [name] Other Clinician: Referring Boyd Buffalo: Treating Maezie Justin/Extender: [name] RD, [name] Weeks in Treatment: 8 Active Problems Location of Pain  Severity and Description of Pain Patient Has Paino No Site Locations Pain Management and Medication Current Pain Management: Electronic Signature(s) Signed: 04/05/2020 6:07:28 PM By: [name] RN, BSN Signed: 04/06/2020 11:12:08 AM By: Sandre Kitty Entered By: Sandre Kitty on 04/05/2020 10:47:40 -------------------------------------------------------------------------------- Patient/Caregiver Education Details Patient Name: Date of Service: [facility] [name], [name] [place] [name] 9/15/2021andnbsp10:00 A M Medical Record Number: [id number] Patient Account Number: [id number] Date of Birth/Gender: Treating RN: 1951/04/10 (69 y.o. [name] Primary Care Physician: [place], [name] Other Clinician: Referring Physician: Treating Physician/Extender: [name] RD, [name] Weeks in Treatment: 8 Education Assessment Education Provided To: Patient Education Topics Provided Venous: Methods: Explain/Verbal Responses: Reinforcements needed, State content correctly Wound/Skin Impairment: Methods: Explain/Verbal Responses: Reinforcements needed, State content correctly Electronic Signature(s) Signed: 04/05/2020 6:07:28 PM By: [name] RN, BSN Entered By: [name] on 04/05/2020 11:17:41 -------------------------------------------------------------------------------- Wound Assessment Details Patient Name: Date of Service: [facility] [name], [place] [name] 04/05/2020 10:00 A M Medical Record Number: [id number] Patient Account Number: [id number] Date of Birth/Sex: Treating RN: 04/24/1951 (69 y.o. [name] Primary Care Stephen Baruch: [name]  RD, [name] Other Clinician: Referring Anysha Frappier: Treating Cecilia Nishikawa/Extender: [name] RD, [name] Weeks in Treatment: 8 Wound Status Wound Number: [id number] Primary Etiology: Diabetic Wound/Ulcer of the Lower Extremity Wound Location: Right, Proximal, Medial Lower Leg Wound Status: Healed - Epithelialized Wounding Event:  Blister Date Acquired: 03/08/2020 Weeks Of Treatment: 4 Clustered Wound: Yes Photos Photo Uploaded By: Mikeal Hawthorne on 04/06/2020 14:08:54 Wound Measurements Length: (cm) Width: (cm) Depth: (cm) Area: (cm) Volume: (cm) 0 % Reduction in Area: 100% 0 % Reduction in Volume: 100% 0 0 0 Wound Description Classification: Grade 2 Electronic Signature(s) Signed: 04/05/2020 5:18:19 PM By: [name] Entered By: [name] on 04/05/2020 10:52:29 -------------------------------------------------------------------------------- Wound Assessment Details Patient Name: Date of Service: [name], [place] [name] 04/05/2020 10:00 A M Medical Record Number: [id number] Patient Account Number: [id number] Date of Birth/Sex: Treating RN: 1951-06-29 (69 y.o. [name] Primary Care Mildreth Reek: [name] RD, [name] Other Clinician: Referring Tequia Wolman: Treating Jaymian Bogart/Extender: [name] RD, [name] Weeks in Treatment: 8 Wound Status Wound Number: [id number] Primary Etiology: Diabetic Wound/Ulcer of the Lower Extremity Wound Location: Right, Distal, Medial Lower Leg Wound Status: Healed - Epithelialized Wounding Event: Blister Date Acquired: 03/08/2020 Weeks Of Treatment: 4 Clustered Wound: No Photos Photo Uploaded By: Mikeal Hawthorne on 04/06/2020 14:08:25 Wound Measurements Length: (cm) Width: (cm) Depth: (cm) Area: (cm) Volume: (cm) 0 % Reduction in Area: 100% 0 % Reduction in Volume: 100% 0 0 0 Wound Description Classification: Grade 2 Electronic Signature(s) Signed: 04/05/2020 5:18:19 PM By: [name] Entered By: [name] on 04/05/2020 10:52:29 -------------------------------------------------------------------------------- Wound Assessment Details Patient Name: Date of Service: [name], [place] [name] 04/05/2020 10:00 A M Medical Record Number: [id number] Patient Account Number: [id number] Date of Birth/Sex: Treating RN: Oct 02, 1950 (69 y.o. [name] Primary Care Shriley Joffe: [name] RD, [name] Other Clinician: Referring Eric Morganti: Treating Turkessa Ostrom/Extender: [name] RD, [name] Weeks in Treatment: 8 Wound Status Wound Number: 13 Primary Etiology: Diabetic Wound/Ulcer of the Lower Extremity Wound Location: Right, Anterior Lower Leg Secondary Etiology: Venous Leg Ulcer Wounding Event: Gradually Appeared Wound Status: Healed - Epithelialized Date Acquired: 03/25/2020 Weeks Of Treatment: 1 Clustered Wound: Yes Photos Photo Uploaded By: Mikeal Hawthorne on 04/06/2020 14:08:55 Wound Measurements Length: (cm) Width: (cm) Depth: (cm) Area: (cm) Volume: (cm) 0 % Reduction in Area: 100% 0 % Reduction in Volume: 100% 0 0 0 Wound Description Classification: Grade 2 Electronic Signature(s) Signed: 04/05/2020 5:18:19 PM By: [name] Entered By: [name] on 04/05/2020 10:52:30 -------------------------------------------------------------------------------- Wound Assessment Details Patient Name: Date of Service: [name], [place] [name] 04/05/2020 10:00 A M Medical Record Number: [id number] Patient Account Number: [id number] Date of Birth/Sex: Treating RN: 1950/10/01 (69 y.o. [name] Primary Care Kevante Lunt: [name] RD, [name] Other Clinician: Referring Sanyiah Kanzler: Treating Neetu Carrozza/Extender: [name] RD, [name] Weeks in Treatment: 8 Wound Status Wound Number: 14 Primary Diabetic Wound/Ulcer of the Lower Extremity Etiology: Wound Location: Right, Medial Ankle Wound Open Wounding Event: Gradually Appeared Status: Date Acquired: 03/25/2020 Comorbid Cataracts, Anemia, Asthma, Hypertension, Type II Diabetes, End Weeks Of Treatment: 1 History: Stage Renal Disease, Osteomyelitis, Neuropathy Clustered Wound: No Photos Photo Uploaded By: Mikeal Hawthorne on 04/06/2020 14:08:26 Wound Measurements Length: (cm) 0.1 Width: (cm) 0.1 Depth: (cm) 0.1 Area: (cm) 0.008 Volume: (cm) 0.001 %  Reduction in Area: 97.6% % Reduction in Volume: 97% Epithelialization: Large (67-100%) Tunneling: No Undermining: No Wound Description Classification: Grade 2 Wound Margin: Distinct, outline attached Exudate  Amount: Small Exudate Type: Serosanguineous Exudate Color: red, brown Foul Odor After Cleansing: No Slough/Fibrino Yes Wound Bed Granulation Amount: Large (67-100%) Exposed Structure Granulation Quality: Red, Pink Fascia Exposed: No Necrotic Amount: Small (1-33%) Fat Layer (Subcutaneous Tissue) Exposed: Yes Necrotic Quality: Adherent Slough Tendon Exposed: No Muscle Exposed: No Joint Exposed: No Bone Exposed: No Assessment Notes scabbed area noted. Treatment Notes Wound #14 (Right, Medial Ankle) 1. Cleanse With Wound Cleanser Soap and water 3. Primary Dressing Applied Calcium Alginate Ag 4. Secondary Dressing Dry Gauze 6. Support Layer Applied 3 layer compression wrap Notes stockinette. Electronic Signature(s) Signed: 04/05/2020 5:18:19 PM By: [name] Entered By: [name] on 04/05/2020 10:53:01 -------------------------------------------------------------------------------- Wound Assessment Details Patient Name: Date of Service: [name] [name], [name] [place] [name] 04/05/2020 10:00 A M Medical Record Number: [id number] Patient Account Number: [id number] Date of Birth/Sex: Treating RN: Aug 31, 1950 (69 y.o. [name] Primary Care Delsin Copen: [name] RD, [name] Other Clinician: Referring Reilynn Lauro: Treating Gael Delude/Extender: [name] RD, [name] Weeks in Treatment: 8 Wound Status Wound Number: 8 Primary Diabetic Wound/Ulcer of the Lower Extremity Etiology: Wound Location: Left T Third oe Wound Open Wounding Event: Blister Status: Date Acquired: 02/23/2020 Comorbid Cataracts, Anemia, Asthma, Hypertension, Type II Diabetes, End Weeks Of Treatment: 6 History: Stage Renal Disease, Osteomyelitis, Neuropathy Clustered Wound: No Photos Photo Uploaded  By: Mikeal Hawthorne on 04/06/2020 14:09:29 Wound Measurements Length: (cm) 1 Width: (cm) 1 Depth: (cm) 0.1 Area: (cm) 0.785 Volume: (cm) 0.079 % Reduction in Area: -456.7% % Reduction in Volume: -464.3% Epithelialization: None Tunneling: No Undermining: No Wound Description Classification: Grade 2 Wound Margin: Flat and Intact Exudate Amount: Small Exudate Type: Serous Exudate Color: amber Foul Odor After Cleansing: No Slough/Fibrino Yes Wound Bed Granulation Amount: None Present (0%) Exposed Structure Necrotic Amount: Large (67-100%) Fascia Exposed: No Necrotic Quality: Adherent Slough Fat Layer (Subcutaneous Tissue) Exposed: Yes Tendon Exposed: No Muscle Exposed: No Joint Exposed: No Bone Exposed: No Assessment Notes periwound redness. Treatment Notes Wound #8 (Left Toe Third) 1. Cleanse With Wound Cleanser 3. Primary Dressing Applied Santyl 4. Secondary Dressing Dry Gauze 5. Secured With Tape Notes netting. explained how to apply santyl, frequency of change, and when to return to wound center. patient in agreement. Electronic Signature(s) Signed: 04/05/2020 5:18:19 PM By: [name] Entered By: [name] on 04/05/2020 10:53:28 -------------------------------------------------------------------------------- Vitals Details Patient Name: Date of Service: [name], [place] [name] 04/05/2020 10:00 A M Medical Record Number: [id number] Patient Account Number: [id number] Date of Birth/Sex: Treating RN: 17-May-1951 (69 y.o. [name] Primary Care Sophie Quiles: [place], [name] Other Clinician: Referring Nidya Bouyer: Treating Sherilyn Windhorst/Extender: [name] RD, [name] Weeks in Treatment: 8 Vital Signs Time Taken: 10:47 Temperature (F): 98.4 Height (in): 67 Pulse (bpm): 85 Weight (lbs): 202 Respiratory Rate (breaths/min): 18 Body Mass Index (BMI): 31.6 Blood Pressure (mmHg): 128/71 Reference Range: 80 - 120 mg / dl Electronic  Signature(s) Signed: 04/06/2020 11:12:08 AM By: Sandre Kitty Entered By: Sandre Kitty on 04/05/2020 10:47:34

## 2020-04-11 DIAGNOSIS — D631 Anemia in chronic kidney disease: Secondary | ICD-10-CM | POA: Diagnosis not present

## 2020-04-11 DIAGNOSIS — N186 End stage renal disease: Secondary | ICD-10-CM | POA: Diagnosis not present

## 2020-04-11 DIAGNOSIS — N2581 Secondary hyperparathyroidism of renal origin: Secondary | ICD-10-CM | POA: Diagnosis not present

## 2020-04-11 DIAGNOSIS — D689 Coagulation defect, unspecified: Secondary | ICD-10-CM | POA: Diagnosis not present

## 2020-04-11 DIAGNOSIS — Z992 Dependence on renal dialysis: Secondary | ICD-10-CM | POA: Diagnosis not present

## 2020-04-12 ENCOUNTER — Other Ambulatory Visit: Payer: Self-pay

## 2020-04-12 ENCOUNTER — Encounter (HOSPITAL_BASED_OUTPATIENT_CLINIC_OR_DEPARTMENT_OTHER): Payer: Medicare Other | Admitting: Physician Assistant

## 2020-04-12 DIAGNOSIS — L97812 Non-pressure chronic ulcer of other part of right lower leg with fat layer exposed: Secondary | ICD-10-CM | POA: Diagnosis not present

## 2020-04-12 DIAGNOSIS — E11621 Type 2 diabetes mellitus with foot ulcer: Secondary | ICD-10-CM | POA: Diagnosis not present

## 2020-04-12 DIAGNOSIS — N186 End stage renal disease: Secondary | ICD-10-CM | POA: Diagnosis not present

## 2020-04-12 DIAGNOSIS — L97522 Non-pressure chronic ulcer of other part of left foot with fat layer exposed: Secondary | ICD-10-CM | POA: Diagnosis not present

## 2020-04-12 DIAGNOSIS — L97312 Non-pressure chronic ulcer of right ankle with fat layer exposed: Secondary | ICD-10-CM | POA: Diagnosis not present

## 2020-04-12 DIAGNOSIS — E11622 Type 2 diabetes mellitus with other skin ulcer: Secondary | ICD-10-CM | POA: Diagnosis not present

## 2020-04-12 DIAGNOSIS — I120 Hypertensive chronic kidney disease with stage 5 chronic kidney disease or end stage renal disease: Secondary | ICD-10-CM | POA: Diagnosis not present

## 2020-04-12 DIAGNOSIS — E1140 Type 2 diabetes mellitus with diabetic neuropathy, unspecified: Secondary | ICD-10-CM | POA: Diagnosis not present

## 2020-04-12 DIAGNOSIS — L97512 Non-pressure chronic ulcer of other part of right foot with fat layer exposed: Secondary | ICD-10-CM | POA: Diagnosis not present

## 2020-04-12 DIAGNOSIS — L97822 Non-pressure chronic ulcer of other part of left lower leg with fat layer exposed: Secondary | ICD-10-CM | POA: Diagnosis not present

## 2020-04-12 DIAGNOSIS — I872 Venous insufficiency (chronic) (peripheral): Secondary | ICD-10-CM | POA: Diagnosis not present

## 2020-04-12 DIAGNOSIS — Z992 Dependence on renal dialysis: Secondary | ICD-10-CM | POA: Diagnosis not present

## 2020-04-12 DIAGNOSIS — E1122 Type 2 diabetes mellitus with diabetic chronic kidney disease: Secondary | ICD-10-CM | POA: Diagnosis not present

## 2020-04-12 NOTE — Progress Notes (Signed)
[name] ([id number]) Visit Report for 04/12/2020 Arrival Information Details Patient Name: Date of Service: [name] [name], [name] 04/12/2020 2:15 PM Medical Record Number: [id number] Patient Account Number: [id number] Date of Birth/Sex: Treating RN: [birth year] (69 y.o. [name] Primary Care [name]: [name] Other Clinician: Referring [name]: Treating [name]: [name] RD, [place] [name] [name]: 9 Visit Information History Since Last Visit Added or deleted any medications: No Patient Arrived: Wheel Chair Any new allergies or adverse reactions: No Arrival Time: 15:01 Had a fall or experienced change in No Accompanied By: alone activities of daily living that may affect Transfer Assistance: None risk of falls: Patient Identification Verified: Yes Signs or symptoms of abuse/neglect since last visito No Secondary Verification Process Completed: Yes Hospitalized since last visit: No Patient Requires Transmission-Based Precautions: No Implantable device outside of the clinic excluding No Patient Has Alerts: Yes cellular tissue based products placed in the center Patient Alerts: Right ABI: [name] since last visit: Left ABI: [name] Has Dressing in Place as Prescribed: Yes Has Compression in Place as Prescribed: Yes Pain Present Now: No Electronic Signature(s) Signed: 04/12/2020 4:54:19 PM By: [name] Entered By: [name] on 04/12/2020 15:01:52 -------------------------------------------------------------------------------- Compression Therapy Details Patient Name: Date of Service: [name], [place] [name] 04/12/2020 2:15 PM Medical Record Number: [id number] Patient Account Number: [id number] Date of Birth/Sex: Treating RN: 1951-03-31 (69 y.o. [name] Primary Care [name]: [name], [name] Other Clinician: Referring [name]: Treating [name]/Extender: [name] RD, [place] [name] [name]:  9 Compression Therapy Performed for Wound Assessment: Non-Wound Location Performed By: [name] Church, RN Compression Type: Three Layer Location: Lower Extremity, Bilateral Post Procedure Diagnosis Same as Pre-procedure Electronic Signature(s) Signed: 04/12/2020 5:11:27 PM By: [name] Gouty RN, [name] Entered By: [name] on 04/12/2020 15:30:43 -------------------------------------------------------------------------------- Encounter Discharge Information Details Patient Name: Date of Service: [name], [name] 04/12/2020 2:15 PM Medical Record Number: [id number] Patient Account Number: [id number] Date of Birth/Sex: Treating RN: 1951/06/02 (69 y.o. [name] Primary Care [name]: [name] RD, [name] Other Clinician: Referring [name]: Treating Carlin Attridge/Extender: [name] RD, [place] [name] [name]: 9 Encounter Discharge Information Items Post Procedure Vitals Discharge Condition: Stable Temperature (F): 98.1 Ambulatory Status: Wheelchair Pulse (bpm): 103 Discharge Destination: Home Respiratory Rate (breaths/min): 18 Transportation: Private Auto Blood Pressure (mmHg): 110/73 Accompanied By: self Schedule Follow-up Appointment: Yes Clinical Summary of Care: Electronic Signature(s) Signed: 04/12/2020 5:01:59 PM By: [name] Entered By: [name] on 04/12/2020 16:02:23 -------------------------------------------------------------------------------- Lower Extremity Assessment Details Patient Name: Date of Service: [name], [name] 04/12/2020 2:15 PM Medical Record Number: [id number] Patient Account Number: [id number] Date of Birth/Sex: Treating RN: February 14, 1951 (69 y.o. [name] Primary Care [name]: [name] Other Clinician: Referring [name]: Treating [name]/Extender: [name] RD, [place] [name] [name]: 9 Edema Assessment Assessed: [Left: No] [Right: No] Edema: [Left: Yes] [Right:  Yes] Calf Left: Right: Point of Measurement: 38 cm From Medial Instep 33.2 cm 34 cm Ankle Left: Right: Point of Measurement: 13 cm From Medial Instep 22.5 cm 23 cm Vascular Assessment Pulses: Dorsalis Pedis Palpable: [Left:Yes] [Right:Yes] Electronic Signature(s) Signed: 04/12/2020 4:54:19 PM By: [name] Entered By: [name] on 04/12/2020 15:12:25 -------------------------------------------------------------------------------- [place] Details Patient Name: Date of Service: [name], [place] [name] 04/12/2020 2:15 PM Medical Record Number: [id number] Patient Account Number: [id number] Date of Birth/Sex: Treating  RN: 09/29/1950 (69 y.o. [name] Primary Care [name]: [name] RD, [name] Other Clinician: Referring [name]: Treating [name]/Extender: [name] RD, [place] [name] [name]: 9 Active Inactive Venous Leg Ulcer Nursing Diagnoses: Knowledge deficit related to disease process and management Potential for venous Insuffiency (use before diagnosis confirmed) Goals: Patient will maintain optimal edema control Date Initiated: 02/09/2020 Target Resolution Date: 05/03/2020 Goal Status: Active Patient/caregiver will verbalize understanding of disease process and disease management Date Initiated: 02/09/2020 Date Inactivated: 03/02/2020 Target Resolution Date: 03/08/2020 Goal Status: Met Interventions: Assess peripheral edema status every visit. Compression as ordered Provide education on venous insufficiency [name] Activities: Therapeutic compression applied : 02/09/2020 Notes: Wound/Skin Impairment Nursing Diagnoses: Impaired tissue integrity Knowledge deficit related to ulceration/compromised skin integrity Goals: Patient/caregiver will verbalize understanding of skin care regimen Date Initiated: 02/09/2020 Target Resolution Date: 05/03/2020 Goal Status: Active Ulcer/skin breakdown will have a volume  reduction of 30% by week 4 Date Initiated: 02/09/2020 Date Inactivated: 03/02/2020 Target Resolution Date: 03/08/2020 Goal Status: Met Interventions: Assess patient/caregiver ability to obtain necessary supplies Assess patient/caregiver ability to perform ulcer/skin care regimen upon admission and as needed Assess ulceration(s) every visit Provide education on ulcer and skin care [name] Activities: Skin care regimen initiated : 02/09/2020 Topical wound management initiated : 02/09/2020 Notes: Electronic Signature(s) Signed: 04/12/2020 5:11:27 PM By: [name] Gouty RN, [name] Entered By: [name] on 04/12/2020 15:28:21 -------------------------------------------------------------------------------- Pain Assessment Details Patient Name: Date of Service: [name] [name], [place] [name] 04/12/2020 2:15 PM Medical Record Number: [id number] Patient Account Number: [id number] Date of Birth/Sex: Treating RN: 09-07-1950 (69 y.o. [name] Primary Care Briel Gallicchio: [name] RD, [name] Other Clinician: Referring Kellis Mcadam: Treating Geneveive Furness/Extender: [name] RD, [place] [name] [name]: 9 Active Problems Location of Pain Severity and Description of Pain Patient Has Paino No Site Locations Pain Management and Medication Current Pain Management: Electronic Signature(s) Signed: 04/12/2020 4:54:19 PM By: [name] Entered By: [name] on 04/12/2020 15:02:28 -------------------------------------------------------------------------------- Patient/Caregiver Education Details Patient Name: Date of Service: [facility] [name], [place] [name] 9/22/2021andnbsp2:15 PM Medical Record Number: [id number] Patient Account Number: [id number] Date of Birth/Gender: Treating RN: 25-Nov-1950 (69 y.o. [name] Primary Care Physician: [place], [name] Other Clinician: Referring Physician: Treating Physician/Extender: [name] RD, [place] [name] [name]: 9 Education  Assessment Education Provided To: Patient Education Topics Provided Venous: Methods: Explain/Verbal Responses: Reinforcements needed, State content correctly Wound/Skin Impairment: Methods: Explain/Verbal Responses: Reinforcements needed, State content correctly Electronic Signature(s) Signed: 04/12/2020 5:11:27 PM By: [name] Gouty RN, [name] Entered By: [name] on 04/12/2020 15:28:43 -------------------------------------------------------------------------------- Wound Assessment Details Patient Name: Date of Service: [name], [place] [name] 04/12/2020 2:15 PM Medical Record Number: [id number] Patient Account Number: [id number] Date of Birth/Sex: Treating RN: 10-14-1950 (69 y.o. [name] Primary Care Talayia Hjort: [name] Other Clinician: Referring Esther Broyles: Treating Xandra Laramee/Extender: [name] RD, [place] [name] [name]: 9 Wound Status Wound Number: 14 Primary Diabetic Wound/Ulcer of the Lower Extremity Etiology: Wound Location: Right, Medial Ankle Wound Open Wounding Event: Gradually Appeared Status: Date Acquired: 03/25/2020 Comorbid Cataracts, Anemia, Asthma, Hypertension, Type II Diabetes, End [name] Of [name]: 2 History: Stage Renal Disease, Osteomyelitis, Neuropathy Clustered Wound: No Wound Measurements Length: (cm) Width: (cm) Depth: (cm) Area: (cm) Volume: (cm) 0 % Reduction in Area: 100% 0 % Reduction in Volume: 100% 0 Epithelialization: Large (67-100%) 0 Tunneling: No 0 Undermining: No Wound Description Classification: Grade 2 Wound Margin: Distinct,  outline attached Exudate Amount: None Present Foul Odor After Cleansing: No Slough/Fibrino No Wound Bed Granulation Amount: None Present (0%) Exposed Structure Necrotic Amount: None Present (0%) Fascia Exposed: No Fat Layer (Subcutaneous Tissue) Exposed: No Tendon Exposed: No Muscle Exposed: No Joint Exposed: No Bone Exposed: No Electronic Signature(s) Signed:  04/12/2020 4:54:19 PM By: [name] Entered By: [name] on 04/12/2020 15:12:56 -------------------------------------------------------------------------------- Wound Assessment Details Patient Name: Date of Service: [name], [place] [name] 04/12/2020 2:15 PM Medical Record Number: [id number] Patient Account Number: [id number] Date of Birth/Sex: Treating RN: [birth year] (69 y.o. [name] Primary Care Jaskiran Pata: [name] Other Clinician: Referring Jakobie Henslee: Treating Zelta Enfield/Extender: [name] RD, [place] [name] [name]: 9 Wound Status Wound Number: 8 Primary Diabetic Wound/Ulcer of the Lower Extremity Etiology: Wound Location: Left T Third oe Wound Open Wounding Event: Blister Status: Date Acquired: 02/23/2020 Comorbid Cataracts, Anemia, Asthma, Hypertension, Type II Diabetes, End [name] Of [name]: 7 History: Stage Renal Disease, Osteomyelitis, Neuropathy Clustered Wound: No Wound Measurements Length: (cm) 1 Width: (cm) 1 Depth: (cm) 0.1 Area: (cm) 0.785 Volume: (cm) 0.079 % Reduction in Area: -456.7% % Reduction in Volume: -464.3% Epithelialization: None Tunneling: No Undermining: No Wound Description Classification: Grade 2 Wound Margin: Flat and Intact Exudate Amount: Small Exudate Type: Serous Exudate Color: amber Foul Odor After Cleansing: No Slough/Fibrino Yes Wound Bed Granulation Amount: None Present (0%) Exposed Structure Necrotic Amount: Large (67-100%) Fascia Exposed: No Necrotic Quality: Adherent Slough Fat Layer (Subcutaneous Tissue) Exposed: Yes Tendon Exposed: No Muscle Exposed: No Joint Exposed: No Bone Exposed: No [name] Notes Wound #8 (Left Toe Third) 1. Cleanse With Wound Cleanser Soap and water 3. Primary Dressing Applied Santyl 4. Secondary Dressing Dry Gauze Roll Gauze Notes netting. Electronic Signature(s) Signed: 04/12/2020 4:54:19 PM By: [name] Entered By:  [name] on 04/12/2020 15:13:08 -------------------------------------------------------------------------------- [place] Details Patient Name: Date of Service: [name], [place] [name] 04/12/2020 2:15 PM Medical Record Number: [id number] Patient Account Number: [id number] Date of Birth/Sex: Treating RN: [birth year] (69 y.o. [name] Primary Care Lanisha Stepanian: [place], [place] Other Clinician: Referring Tamina Cyphers: Treating Florencio Hollibaugh/Extender: [name] RD, [place] [name] [name]: 9 Vital Signs Time Taken: 15:02 Temperature (F): 98.1 Height (in): 67 Pulse (bpm): 103 Weight (lbs): 202 Respiratory Rate (breaths/min): 18 Body Mass Index (BMI): 31.6 Blood Pressure (mmHg): 110/73 Capillary Blood Glucose (mg/dl): 105 Reference Range: 80 - 120 mg / dl Notes glucose per pt report Electronic Signature(s) Signed: 04/12/2020 4:54:19 PM By: [name] Entered By: [name] on 04/12/2020 15:02:22

## 2020-04-12 NOTE — Progress Notes (Addendum)
[name], [name] ([id number]) Visit Report for 04/12/2020 Chief Complaint Document Details Patient Name: Date of Service: [name] [name], [name] 04/12/2020 2:15 PM Medical Record Number: [id number] Patient Account Number: [id number] Date of Birth/Sex: Treating RN: 12/16/1950 (69 y.o. [name] Primary Care Provider: Forrest, Hillery [name] Other Clinician: Referring Provider: Treating Provider/Extender: [name] [name] [name] in Treatment: 9 Information Obtained from: Patient Chief Complaint Multiple LE Ulcers Electronic Signature(s) Signed: 04/12/2020 2:16:48 PM By: [name] Entered By: [name] on 04/12/2020 14:16:48 -------------------------------------------------------------------------------- Debridement Details Patient Name: Date of Service: [name] [name], [place] [name] 04/12/2020 2:15 PM Medical Record Number: [id number] Patient Account Number: [id number] Date of Birth/Sex: Treating RN: 11/03/1950 (69 y.o. [name] Primary Care Provider: Alexander, Hillery [name] Other Clinician: Referring Provider: Treating Provider/Extender: [name] [name] [name] in Treatment: 9 Debridement Performed for Assessment: Wound #8 Left T Third oe Performed By: Clinician [name] Gouty, RN Debridement Type: Chemical/Enzymatic/Mechanical Agent Used: Santyl Severity of Tissue Pre Debridement: Fat layer exposed Level of Consciousness (Pre-procedure): Awake and Alert Pre-procedure Verification/Time Out Yes - 15:30 Taken: Bleeding: None Response to Treatment: Procedure was tolerated well Level of Consciousness (Post- Awake and Alert procedure): Post Debridement Measurements of Total Wound Length: (cm) 1 Width: (cm) 1 Depth: (cm) 0.1 Volume: (cm) 0.079 Character of Wound/Ulcer Post Debridement: Requires Further Debridement Severity of Tissue Post Debridement: Fat layer exposed Post Procedure Diagnosis Same as Pre-procedure Electronic  Signature(s) Signed: 04/12/2020 5:11:27 PM By: [name] Signed: 04/12/2020 5:16:27 PM By: [name] Entered By: [name] on 04/12/2020 15:31:59 -------------------------------------------------------------------------------- HPI Details Patient Name: Date of Service: [name], [name] [name] 04/12/2020 2:15 PM Medical Record Number: [id number] Patient Account Number: [id number] Date of Birth/Sex: Treating RN: 08/28/1950 (69 y.o. [name] Primary Care Provider: Elio [name] RD, Hillery [name] Other Clinician: Referring Provider: Treating Provider/Extender: [name] [name] [name] in Treatment: 9 History of Present Illness HPI Description: Evaluate7/21/2021 today patient presents for evaluation of ulcerations on her legs which she tells me tend to come and go as far as small blistering locations and sometimes are better than other times. Right now she tells me that this is actually doing a little bit better but nonetheless will not completely go away. She has ulcerations bilaterally on her lower extremities. The right is worse than the left. 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 The patient has no evidence of active infection at this time. She however appears to have proficient blood flow into the extremities and I think would tolerate a 3 layer compression wrap she was noncompressible on arterial studies. Nonetheless there was no obvious signs of occlusion. 02/16/2020 on evaluation today patient appears to be doing much better in regard to her wounds in general. On the past week she has made significant improvements which is great news there is no signs of active infection at this time. No fevers, chills, nausea, vomiting, or diarrhea. 02/23/2020 on evaluation today patient appears to be doing well in regard to her lower extremity ulcers and  ankle ulcers. Overall I feel like she is making great progress and in general I am extremely pleased with how things stand. There is no sign of active infection at this time which is also good news. She will require slight debridement around the right ankle  region. 03/02/20-Patient returns at 1 week, has few new areas open on the left leg, especially 1 healed area on the medial ankle that has slight small open area now, to anterior leg areas that started out as blisters that are open now. She was using juxta lite compression to the left, she used Band-Aid to cover the vesicle that had ruptured on the left anterior leg, we are using 3 layer compression on the right. We are using silver alginate to the toe wound on the left. 03/08/2020 upon evaluation today patient unfortunately is doing much worse in regard to her right leg although her left leg looks like is doing better. Again we have taken her compression wraps off last week since she was doing well and transitioned her to her Velcro compression wrap. However apparently the patient is stating this was not put on here in the clinic and she never put it on at home. With that being said unfortunately she developed swelling and opened up new wounds which are present today she has quite a few of them in fact. 03/15/2020 on evaluation today patient appears to be doing well with regard to her wounds on the lower extremities bilaterally. Fortunately there is no signs of active infection at this time. No fever chills noted. Overall I am actually very pleased with where things stand currently. 03/22/2020 on evaluation today patient appears to be doing fairly well in regard to her wounds in general. She just has really one area left on her toe everything else is healed on her legs. She does have her Velcro compression wraps which I think are appropriate at this point. Fortunately there is no signs of active infection at this time. No fevers, chills, nausea,  vomiting, or diarrhea. 03/29/2020 on evaluation today patient appears to be doing poorly in regard to her right leg which is pretty much completely reopened compared to last week. She states it was okay until Saturday when she woke up she had blisters. With that being said it appears that this is likely stemming from the fact that the patient is apparently though I just found this out today going to sleep in her bed with her legs up on the bed with her but throughout the night she tends to end up with her legs hanging off the side or bottom of the bed on the floor. With that being said I think that this likely is happening much too long and then she has significant swelling the builds up as she sleeps. Obviously I think this is the reasoning behind what we are seeing currently. 04/05/2020 on evaluation today patient appears to be doing quite well with regard to her leg ulcers at this point. Fortunately there is no signs of anything significantly open again which is great news nonetheless I am to continue wrapping her for at least a couple of [name] due to the fact that she seems to continually reopen as soon as we unwrapped her. 04/12/2020 upon evaluation today patient appears to be doing excellent in regard to her leg ulcers all appear to be improving which is great news. She is also doing better in regard to the toe the infection seems to be under better control and the Santyl is helping to clean this up. Overall very pleased much more so than what I saw last week. Electronic Signature(s) Signed: 04/12/2020 3:34:25 PM By: [name] Entered By: [name] on 04/12/2020 15:34:25 -------------------------------------------------------------------------------- Physical Exam Details Patient Name: Date of Service:  [name], [employer] [name] 04/12/2020 2:15 PM Medical Record Number: [id number] Patient Account Number: [id number] Date of Birth/Sex: Treating RN: 02/18/1951 (69 y.o. [name] Primary Care Provider: Elio [name] RD, Hillery [name] Other Clinician: Referring Provider: Treating Provider/Extender: [name] [name] [name] in Treatment: 9 Constitutional Well-nourished and well-hydrated in no acute distress. Respiratory normal breathing without difficulty. Psychiatric this patient is able to make decisions and demonstrates good insight into disease process. Alert and Oriented x 3. pleasant and cooperative. Notes Upon inspection patient's wound bed actually showed signs of good granulation at this time there does not appear to be any evidence of active infection which is great news overall very pleased with where things stand. Electronic Signature(s) Signed: 04/12/2020 3:34:39 PM By: [name] Entered By: [name] on 04/12/2020 15:34:39 -------------------------------------------------------------------------------- Physician Orders Details Patient Name: Date of Service: [name] [name], [place] [name] 04/12/2020 2:15 PM Medical Record Number: [id number] Patient Account Number: [id number] Date of Birth/Sex: Treating RN: 1951-05-01 (69 y.o. [name] Primary Care Provider: Elio [name] RD, Hillery [name] Other Clinician: Referring Provider: Treating Provider/Extender: [name] [name] [name] in Treatment: 85 Verbal / Phone Orders: No Diagnosis Coding ICD-10 Coding Code Description I87.2 Venous insufficiency (chronic) (peripheral) E11.621 Type 2 diabetes mellitus with foot ulcer L97.822 Non-pressure chronic ulcer of other part of left lower leg with fat layer exposed L97.312 Non-pressure chronic ulcer of right ankle with fat layer exposed L97.812 Non-pressure chronic ulcer of other part of right lower leg with fat layer exposed L97.512 Non-pressure chronic ulcer of other part of right foot with fat layer exposed N18.6 End stage renal disease Z99.2 Dependence on renal dialysis I10 Essential (primary) hypertension Follow-up  Appointments Return Appointment in 1 week. Dressing Change Frequency Wound #8 Left T Third oe Change dressing every day. Skin Barriers/Peri-Wound Care Moisturizing lotion - to both legs Wound Cleansing Wound #8 Left T Third oe Clean wound with Normal Saline. May shower with protection. Primary Wound Dressing Wound #8 Left T Third oe Santyl Ointment Secondary Dressing Wound #8 Left T Third oe Kerlix/Rolled Gauze Dry Gauze Edema Control 3 Layer Compression System - Bilateral Avoid standing for long periods of time Elevate legs to the level of the heart or above for 30 minutes daily and/or when sitting, a frequency of: - throughout the day Exercise regularly Electronic Signature(s) Signed: 04/12/2020 5:11:27 PM By: [name] Signed: 04/12/2020 5:16:27 PM By: [name] Entered By: [name] on 04/12/2020 15:33:09 -------------------------------------------------------------------------------- Problem List Details Patient Name: Date of Service: [name] [name], [place] [name] 04/12/2020 2:15 PM Medical Record Number: [id number] Patient Account Number: [id number] Date of Birth/Sex: Treating RN: 1950-08-06 (69 y.o. [name] Primary Care Provider: Elio [name] [name] Other Clinician: Referring Provider: Treating Provider/Extender: [name] [name] [name] in Treatment: 9 Active Problems ICD-10 Encounter Code Description Active Date MDM Diagnosis I87.2 Venous insufficiency (chronic) (peripheral) 02/09/2020 No Yes E11.621 Type 2 diabetes mellitus with foot ulcer 02/09/2020 No Yes L97.822 Non-pressure chronic ulcer of other part of left lower leg with fat layer exposed7/21/2021 No Yes L97.312 Non-pressure chronic ulcer of right ankle with fat layer exposed 02/09/2020 No Yes L97.812 Non-pressure chronic ulcer of other part of right lower leg with fat layer 02/09/2020 No Yes exposed L97.512 Non-pressure chronic ulcer of other part of right  foot with fat layer exposed 02/09/2020 No Yes N18.6 End stage renal disease 02/09/2020 No Yes Z99.2  Dependence on renal dialysis 02/09/2020 No Yes I10 Essential (primary) hypertension 02/09/2020 No Yes Inactive Problems Resolved Problems Electronic Signature(s) Signed: 04/12/2020 2:16:40 PM By: [name] Entered By: [name] on 04/12/2020 14:16:40 -------------------------------------------------------------------------------- Progress Note Details Patient Name: Date of Service: [facility] [name], [place] [name] 04/12/2020 2:15 PM Medical Record Number: [id number] Patient Account Number: [id number] Date of Birth/Sex: Treating RN: 02/05/1951 (69 y.o. [name] Primary Care Provider: Elio [name] RD, Hillery [name] Other Clinician: Referring Provider: Treating Provider/Extender: [name] [name] [name] in Treatment: 9 Subjective Chief Complaint Information obtained from Patient Multiple LE Ulcers History of Present Illness (HPI) Evaluate7/21/2021 today patient presents for evaluation of ulcerations on her legs which she tells me tend to come and go as far as small blistering locations and sometimes are better than other times. Right now she tells me that this is actually doing a little bit better but nonetheless will not completely go away. She has ulcerations bilaterally on her lower extremities. The right is worse than the left. 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 The patient has no evidence of active infection at this time. She however appears to have proficient blood flow into the extremities and I think would tolerate a 3 layer compression wrap she was noncompressible on arterial studies. Nonetheless there was no obvious signs of occlusion. 02/16/2020 on evaluation today patient appears to be doing much better in regard to her wounds in general. On the past  week she has made significant improvements which is great news there is no signs of active infection at this time. No fevers, chills, nausea, vomiting, or diarrhea. 02/23/2020 on evaluation today patient appears to be doing well in regard to her lower extremity ulcers and ankle ulcers. Overall I feel like she is making great progress and in general I am extremely pleased with how things stand. There is no sign of active infection at this time which is also good news. She will require slight debridement around the right ankle region. 03/02/20-Patient returns at 1 week, has few new areas open on the left leg, especially 1 healed area on the medial ankle that has slight small open area now, to anterior leg areas that started out as blisters that are open now. She was using juxta lite compression to the left, she used Band-Aid to cover the vesicle that had ruptured on the left anterior leg, we are using 3 layer compression on the right. We are using silver alginate to the toe wound on the left. 03/08/2020 upon evaluation today patient unfortunately is doing much worse in regard to her right leg although her left leg looks like is doing better. Again we have taken her compression wraps off last week since she was doing well and transitioned her to her Velcro compression wrap. However apparently the patient is stating this was not put on here in the clinic and she never put it on at home. With that being said unfortunately she developed swelling and opened up new wounds which are present today she has quite a few of them in fact. 03/15/2020 on evaluation today patient appears to be doing well with regard to her wounds on the lower extremities bilaterally. Fortunately there is no signs of active infection at this time. No fever chills noted. Overall I am actually very pleased with where things stand currently. 03/22/2020 on evaluation today patient appears  to be doing fairly well in regard to her wounds in general.  She just has really one area left on her toe everything else is healed on her legs. She does have her Velcro compression wraps which I think are appropriate at this point. Fortunately there is no signs of active infection at this time. No fevers, chills, nausea, vomiting, or diarrhea. 03/29/2020 on evaluation today patient appears to be doing poorly in regard to her right leg which is pretty much completely reopened compared to last week. She states it was okay until Saturday when she woke up she had blisters. With that being said it appears that this is likely stemming from the fact that the patient is apparently though I just found this out today going to sleep in her bed with her legs up on the bed with her but throughout the night she tends to end up with her legs hanging off the side or bottom of the bed on the floor. With that being said I think that this likely is happening much too long and then she has significant swelling the builds up as she sleeps. Obviously I think this is the reasoning behind what we are seeing currently. 04/05/2020 on evaluation today patient appears to be doing quite well with regard to her leg ulcers at this point. Fortunately there is no signs of anything significantly open again which is great news nonetheless I am to continue wrapping her for at least a couple of [name] due to the fact that she seems to continually reopen as soon as we unwrapped her. 04/12/2020 upon evaluation today patient appears to be doing excellent in regard to her leg ulcers all appear to be improving which is great news. She is also doing better in regard to the toe the infection seems to be under better control and the Santyl is helping to clean this up. Overall very pleased much more so than what I saw last week. Objective Constitutional Well-nourished and well-hydrated in no acute distress. Vitals Time Taken: 3:02 PM, Height: 67 in, Weight: 202 lbs, BMI: 31.6, Temperature: 98.1 F, Pulse:  103 bpm, Respiratory Rate: 18 breaths/min, Blood Pressure: 110/73 mmHg, Capillary Blood Glucose: 105 mg/dl. General Notes: glucose per pt report Respiratory normal breathing without difficulty. Psychiatric this patient is able to make decisions and demonstrates good insight into disease process. Alert and Oriented x 3. pleasant and cooperative. General Notes: Upon inspection patient's wound bed actually showed signs of good granulation at this time there does not appear to be any evidence of active infection which is great news overall very pleased with where things stand. Integumentary (Hair, Skin) Wound #14 status is Open. Original cause of wound was Gradually Appeared. The wound is located on the Right,Medial Ankle. The wound measures 0cm length x 0cm width x 0cm depth; 0cm^2 area and 0cm^3 volume. There is no tunneling or undermining noted. There is a none present amount of drainage noted. The wound margin is distinct with the outline attached to the wound base. There is no granulation within the wound bed. There is no necrotic tissue within the wound bed. Wound #8 status is Open. Original cause of wound was Blister. The wound is located on the Left T Third. The wound measures 1cm length x 1cm width x oe 0.1cm depth; 0.785cm^2 area and 0.079cm^3 volume. There is Fat Layer (Subcutaneous Tissue) exposed. There is no tunneling or undermining noted. There is a small amount of serous drainage noted. The wound margin is flat and  intact. There is no granulation within the wound bed. There is a large (67-100%) amount of necrotic tissue within the wound bed including Adherent Slough. 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 There was a Chemical/Enzymatic/Mechanical debridement performed by [name] Gouty, RN.Marland Kitchen Agent used was [employer]. A time out was conducted at 15:30, prior to the start of the procedure. There was no bleeding. The procedure was tolerated well. Post Debridement Measurements: 1cm length x 1cm width x 0.1cm depth; 0.079cm^3 volume. Character of Wound/Ulcer Post Debridement requires further debridement. Severity of Tissue Post Debridement is: Fat layer exposed. Post procedure Diagnosis Wound #8: Same as Pre-Procedure There was a Three Layer Compression Therapy Procedure by [name], RN. Post procedure Diagnosis Wound #: Same as Pre-Procedure Plan Follow-up Appointments: Return Appointment in 1 week. Dressing Change Frequency: Wound #8 Left T Third: oe Change dressing every day. Skin Barriers/Peri-Wound Care: Moisturizing lotion - to both legs Wound Cleansing: Wound #8 Left T Third: oe Clean wound with Normal Saline. May shower with protection. Primary Wound Dressing: Wound #8 Left T Third: oe Santyl Ointment Secondary Dressing: Wound #8 Left T Third: oe Kerlix/Rolled Gauze Dry Gauze Edema Control: 3 Layer Compression System - Bilateral Avoid standing for long periods of time Elevate legs to the level of the heart or above for 30 minutes daily and/or when sitting, a frequency of: - throughout the day Exercise regularly 1. I am going to suggest that we going to continue with the wound care measures specifically with regard to the Santyl to the toe followed by gauze wrapping to secure in  place. 2. She should also continue to take the antibiotics I think that is doing great for her. 3. With regard to the x-ray I did not see any evidence of obvious osteomyelitis I think this is mainly degenerative changes noted on the x-ray that was discussed with the patient today as well. 4. With regard to the compression wrap and recommend we continue with 3 layer compression wraps bilaterally for this week. After that we will go ahead next week and transition her to her Velcro wraps hopefully after several [name] of keeping this under control she will be able to prevent this from breaking back down. We will see patient back for reevaluation in 1 week here in the clinic. If anything worsens or changes patient will contact our office for additional recommendations. Electronic Signature(s) Signed: 04/12/2020 3:35:41 PM By: [name] Entered By: [name] on 04/12/2020 15:35:40 -------------------------------------------------------------------------------- SuperBill Details Patient Name: Date of Service: [facility] [name], [place] [name] 04/12/2020 Medical Record Number: [id number] Patient Account Number: [id number] Date of Birth/Sex: Treating RN: 1950-09-11 (69 y.o. [name] Primary Care Provider: [place], Hillery [name] Other Clinician: Referring Provider: Treating Provider/Extender: [name] [name] [name] in Treatment: 9 Diagnosis Coding ICD-10 Codes Code Description I87.2 Venous insufficiency (chronic) (peripheral) E11.621 Type 2 diabetes  mellitus with foot ulcer L97.822 Non-pressure chronic ulcer of other part of left lower leg with fat layer exposed L97.312 Non-pressure chronic ulcer of right ankle with fat layer exposed L97.812 Non-pressure chronic ulcer of other part of right lower leg with fat layer exposed L97.512 Non-pressure chronic ulcer of other part of right foot with fat layer exposed N18.6 End stage renal disease Z99.2 Dependence on renal  dialysis I10 Essential (primary) hypertension Facility Procedures CPT4: Code [id number] Description: 2 - DEBRIDE W/O ANES NON SELECT Modifier: Quantity: 1 CPT4: [id number] foot Description: 1 BILATERAL: Application of multi-layer venous compression system; leg (below knee), including ankle and . Modifier: 59 Quantity: 1 Physician Procedures : CPT4 Code Description Modifier [id number] - WC PHYS LEVEL 4 - EST PT ICD-10 Diagnosis Description I87.2 Venous insufficiency (chronic) (peripheral) E11.621 Type 2 diabetes mellitus with foot ulcer L97.822 Non-pressure chronic ulcer of other part of  left lower leg with fat layer exposed L97.312 Non-pressure chronic ulcer of right ankle with fat layer exposed Quantity: 1 Electronic Signature(s) Signed: 04/12/2020 3:36:04 PM By: [name] Entered By: [name] on 04/12/2020 15:36:03

## 2020-04-13 DIAGNOSIS — D631 Anemia in chronic kidney disease: Secondary | ICD-10-CM | POA: Diagnosis not present

## 2020-04-13 DIAGNOSIS — N2581 Secondary hyperparathyroidism of renal origin: Secondary | ICD-10-CM | POA: Diagnosis not present

## 2020-04-13 DIAGNOSIS — Z992 Dependence on renal dialysis: Secondary | ICD-10-CM | POA: Diagnosis not present

## 2020-04-13 DIAGNOSIS — D689 Coagulation defect, unspecified: Secondary | ICD-10-CM | POA: Diagnosis not present

## 2020-04-13 DIAGNOSIS — N186 End stage renal disease: Secondary | ICD-10-CM | POA: Diagnosis not present

## 2020-04-15 DIAGNOSIS — Z992 Dependence on renal dialysis: Secondary | ICD-10-CM | POA: Diagnosis not present

## 2020-04-15 DIAGNOSIS — D631 Anemia in chronic kidney disease: Secondary | ICD-10-CM | POA: Diagnosis not present

## 2020-04-15 DIAGNOSIS — N2581 Secondary hyperparathyroidism of renal origin: Secondary | ICD-10-CM | POA: Diagnosis not present

## 2020-04-15 DIAGNOSIS — N186 End stage renal disease: Secondary | ICD-10-CM | POA: Diagnosis not present

## 2020-04-15 DIAGNOSIS — D689 Coagulation defect, unspecified: Secondary | ICD-10-CM | POA: Diagnosis not present

## 2020-04-17 DIAGNOSIS — I871 Compression of vein: Secondary | ICD-10-CM | POA: Diagnosis not present

## 2020-04-17 DIAGNOSIS — T82858A Stenosis of vascular prosthetic devices, implants and grafts, initial encounter: Secondary | ICD-10-CM | POA: Diagnosis not present

## 2020-04-17 DIAGNOSIS — N186 End stage renal disease: Secondary | ICD-10-CM | POA: Diagnosis not present

## 2020-04-18 DIAGNOSIS — D631 Anemia in chronic kidney disease: Secondary | ICD-10-CM | POA: Diagnosis not present

## 2020-04-18 DIAGNOSIS — N2581 Secondary hyperparathyroidism of renal origin: Secondary | ICD-10-CM | POA: Diagnosis not present

## 2020-04-18 DIAGNOSIS — D689 Coagulation defect, unspecified: Secondary | ICD-10-CM | POA: Diagnosis not present

## 2020-04-18 DIAGNOSIS — Z992 Dependence on renal dialysis: Secondary | ICD-10-CM | POA: Diagnosis not present

## 2020-04-18 DIAGNOSIS — N186 End stage renal disease: Secondary | ICD-10-CM | POA: Diagnosis not present

## 2020-04-19 ENCOUNTER — Encounter (HOSPITAL_BASED_OUTPATIENT_CLINIC_OR_DEPARTMENT_OTHER): Payer: Medicare Other | Admitting: Physician Assistant

## 2020-04-19 DIAGNOSIS — E11621 Type 2 diabetes mellitus with foot ulcer: Secondary | ICD-10-CM | POA: Diagnosis not present

## 2020-04-19 DIAGNOSIS — E11622 Type 2 diabetes mellitus with other skin ulcer: Secondary | ICD-10-CM | POA: Diagnosis not present

## 2020-04-19 DIAGNOSIS — Z992 Dependence on renal dialysis: Secondary | ICD-10-CM | POA: Diagnosis not present

## 2020-04-19 DIAGNOSIS — L97822 Non-pressure chronic ulcer of other part of left lower leg with fat layer exposed: Secondary | ICD-10-CM | POA: Diagnosis not present

## 2020-04-19 DIAGNOSIS — I120 Hypertensive chronic kidney disease with stage 5 chronic kidney disease or end stage renal disease: Secondary | ICD-10-CM | POA: Diagnosis not present

## 2020-04-19 DIAGNOSIS — E1140 Type 2 diabetes mellitus with diabetic neuropathy, unspecified: Secondary | ICD-10-CM | POA: Diagnosis not present

## 2020-04-19 DIAGNOSIS — L97812 Non-pressure chronic ulcer of other part of right lower leg with fat layer exposed: Secondary | ICD-10-CM | POA: Diagnosis not present

## 2020-04-19 DIAGNOSIS — E1122 Type 2 diabetes mellitus with diabetic chronic kidney disease: Secondary | ICD-10-CM | POA: Diagnosis not present

## 2020-04-19 DIAGNOSIS — I872 Venous insufficiency (chronic) (peripheral): Secondary | ICD-10-CM | POA: Diagnosis not present

## 2020-04-19 DIAGNOSIS — L97522 Non-pressure chronic ulcer of other part of left foot with fat layer exposed: Secondary | ICD-10-CM | POA: Diagnosis not present

## 2020-04-19 DIAGNOSIS — N186 End stage renal disease: Secondary | ICD-10-CM | POA: Diagnosis not present

## 2020-04-19 DIAGNOSIS — L97512 Non-pressure chronic ulcer of other part of right foot with fat layer exposed: Secondary | ICD-10-CM | POA: Diagnosis not present

## 2020-04-19 DIAGNOSIS — L97312 Non-pressure chronic ulcer of right ankle with fat layer exposed: Secondary | ICD-10-CM | POA: Diagnosis not present

## 2020-04-19 NOTE — Progress Notes (Signed)
KANIAH, RIZZOLO ([id number]) Visit Report for 04/19/2020 Arrival Information Details Patient Name: Date of Service: [place] [name], [name] 04/19/2020 8:45 A M Medical Record Number: [id number] Patient Account Number: [id number] Date of Birth/Sex: Treating RN: 21-Dec-1950 (69 y.o. [name] Primary Care [employer] Null: [name] RD, [name] Other Clinician: Referring Yuliza Cara: Treating Demeco Ducksworth/Extender: [name] RD, [name] Weeks in Treatment: 10 Visit Information History Since Last Visit Added or deleted any medications: No Patient Arrived: Wheel Chair Any new allergies or adverse reactions: No Arrival Time: 08:57 Had a fall or experienced change in No Accompanied By: self activities of daily living that may affect Transfer Assistance: None risk of falls: Patient Identification Verified: Yes Signs or symptoms of abuse/neglect since last visito No Secondary Verification Process Completed: Yes Hospitalized since last visit: No Patient Requires Transmission-Based Precautions: No Implantable device outside of the clinic excluding No Patient Has Alerts: Yes cellular tissue based products placed in the center Patient Alerts: Right ABI: [place] since last visit: Left ABI: [place] Has Dressing in Place as Prescribed: Yes Has Compression in Place as Prescribed: Yes Pain Present Now: No Electronic Signature(s) Signed: 04/19/2020 5:13:43 PM By: [name] Entered By: [name] on 04/19/2020 08:59:42 -------------------------------------------------------------------------------- Encounter Discharge Information Details Patient Name: Date of Service: [name], [place] [name] 04/19/2020 8:45 A M Medical Record Number: [id number] Patient Account Number: [id number] Date of Birth/Sex: Treating RN: [birth year] (69 y.o. [name] Primary Care Naszir Cott: [name] RD, [name] Other Clinician: Referring Rosita Guzzetta: Treating Marshay Slates/Extender: [name] RD, [name] Weeks in Treatment:  10 Encounter Discharge Information Items Post Procedure Vitals Discharge Condition: Stable Temperature (F): 98.9 Ambulatory Status: Ambulatory Pulse (bpm): 84 Discharge Destination: Home Respiratory Rate (breaths/min): 18 Transportation: Private Auto Blood Pressure (mmHg): 91/52 Accompanied By: self Schedule Follow-up Appointment: Yes Clinical Summary of Care: Patient Declined Electronic Signature(s) Signed: 04/19/2020 5:36:27 PM By: [name] RN, BSN Entered By: [name] on 04/19/2020 13:10:38 -------------------------------------------------------------------------------- Lower Extremity Assessment Details Patient Name: Date of Service: [facility] [name], [place] [name] 04/19/2020 8:45 A M Medical Record Number: [id number] Patient Account Number: [id number] Date of Birth/Sex: Treating RN: November 17, 1950 (69 y.o. [name] Primary Care Carrisa Keller: [name] RD, [name] Other Clinician: Referring Adonis Ryther: Treating Brynlynn Walko/Extender: [name] RD, [name] Weeks in Treatment: 10 Edema Assessment Assessed: [Left: Yes] [Right: Yes] Edema: [Left: No] [Right: No] Calf Left: Right: Point of Measurement: 38 cm From Medial Instep 34 cm 34 cm Ankle Left: Right: Point of Measurement: 13 cm From Medial Instep 24 cm 24 cm Electronic Signature(s) Signed: 04/19/2020 5:13:43 PM By: [name] Entered By: [name] on 04/19/2020 09:06:46 -------------------------------------------------------------------------------- [place] Details Patient Name: Date of Service: [facility] [name], [place] [name] 04/19/2020 8:45 A M Medical Record Number: [id number] Patient Account Number: [id number] Date of Birth/Sex: Treating RN: 1950/08/24 (69 y.o. [name] Primary Care Inice Sanluis: [name] RD, [name] Other Clinician: Referring Oniyah Rohe: Treating Terrilyn Tyner/Extender: [name] RD, [name] Weeks in Treatment: 10 Active Inactive Venous Leg Ulcer Nursing  Diagnoses: Knowledge deficit related to disease process and management Potential for venous Insuffiency (use before diagnosis confirmed) Goals: Patient will maintain optimal edema control Date Initiated: 02/09/2020 Target Resolution Date: 05/03/2020 Goal Status: Active Patient/caregiver will verbalize understanding of disease process and disease management Date Initiated: 02/09/2020 Date Inactivated: 03/02/2020 Target Resolution Date: 03/08/2020 Goal Status: Met Interventions: Assess peripheral edema status every visit. Compression as ordered Provide education on venous insufficiency Treatment  Activities: Therapeutic compression applied : 02/09/2020 Notes: Wound/Skin Impairment Nursing Diagnoses: Impaired tissue integrity Knowledge deficit related to ulceration/compromised skin integrity Goals: Patient/caregiver will verbalize understanding of skin care regimen Date Initiated: 02/09/2020 Target Resolution Date: 05/03/2020 Goal Status: Active Ulcer/skin breakdown will have a volume reduction of 30% by week 4 Date Initiated: 02/09/2020 Date Inactivated: 03/02/2020 Target Resolution Date: 03/08/2020 Goal Status: Met Interventions: Assess patient/caregiver ability to obtain necessary supplies Assess patient/caregiver ability to perform ulcer/skin care regimen upon admission and as needed Assess ulceration(s) every visit Provide education on ulcer and skin care Treatment Activities: Skin care regimen initiated : 02/09/2020 Topical wound management initiated : 02/09/2020 Notes: Electronic Signature(s) Signed: 04/19/2020 5:36:27 PM By: [name] RN, BSN Entered By: [name] on 04/19/2020 09:31:57 -------------------------------------------------------------------------------- Pain Assessment Details Patient Name: Date of Service: [name], [place] [name] 04/19/2020 8:45 A M Medical Record Number: [id number] Patient Account Number: [id number] Date of Birth/Sex: Treating  RN: Jun 12, 1951 (69 y.o. [name] Primary Care Ricardo Schubach: [name] RD, [name] Other Clinician: Referring Nakkia Mackiewicz: Treating Joyice Magda/Extender: [name] RD, [name] Weeks in Treatment: 10 Active Problems Location of Pain Severity and Description of Pain Patient Has Paino No Site Locations Rate the pain. Current Pain Level: 0 Pain Management and Medication Current Pain Management: Medication: No Cold Application: No Rest: No Massage: No Activity: No T.E.N.S.: No Heat Application: No Leg drop or elevation: No Is the Current Pain Management Adequate: Adequate How does your wound impact your activities of daily livingo Sleep: No Bathing: No Appetite: No Relationship With Others: No Bladder Continence: No Emotions: No Bowel Continence: No Work: No Toileting: No Drive: No Dressing: No Hobbies: No Electronic Signature(s) Signed: 04/19/2020 5:13:43 PM By: [name] Entered By: [name] on 04/19/2020 09:00:11 -------------------------------------------------------------------------------- Patient/Caregiver Education Details Patient Name: Date of Service: [facility] [name], [place] [name] 9/29/2021andnbsp8:45 A M Medical Record Number: [id number] Patient Account Number: [id number] Date of Birth/Gender: Treating RN: [birth year] (69 y.o. [name] Primary Care Physician: [place], [name] Other Clinician: Referring Physician: Treating Physician/Extender: [name] RD, [name] Weeks in Treatment: 10 Education Assessment Education Provided To: Patient Education Topics Provided Venous: Methods: Explain/Verbal Responses: Reinforcements needed, State content correctly Wound/Skin Impairment: Methods: Explain/Verbal Responses: Reinforcements needed, State content correctly Electronic Signature(s) Signed: 04/19/2020 5:36:27 PM By: [name] RN, BSN Entered By: [name] on 04/19/2020  09:32:16 -------------------------------------------------------------------------------- Wound Assessment Details Patient Name: Date of Service: [facility] [name], [place] [name] 04/19/2020 8:45 A M Medical Record Number: [id number] Patient Account Number: [id number] Date of Birth/Sex: Treating RN: 20-Oct-1950 (69 y.o. [name] Primary Care Shenita Trego: [name] RD, [name] Other Clinician: Referring Keyler Hoge: Treating Emera Bussie/Extender: [name] RD, [name] Weeks in Treatment: 10 Wound Status Wound Number: 8 Primary Diabetic Wound/Ulcer of the Lower Extremity Etiology: Wound Location: Left T Third oe Wound Open Wounding Event: Blister Status: Date Acquired: 02/23/2020 Comorbid Cataracts, Anemia, Asthma, Hypertension, Type II Diabetes, End Weeks Of Treatment: 8 History: Stage Renal Disease, Osteomyelitis, Neuropathy Clustered Wound: No Wound Measurements Length: (cm) 0.9 Width: (cm) 0.9 Depth: (cm) 0.1 Area: (cm) 0.636 Volume: (cm) 0.064 % Reduction in Area: -351.1% % Reduction in Volume: -357.1% Epithelialization: Small (1-33%) Tunneling: No Undermining: No Wound Description Classification: Grade 2 Wound Margin: Flat and Intact Exudate Amount: Small Exudate Type: Serous Exudate Color: amber Foul Odor After Cleansing: No Slough/Fibrino Yes Wound Bed Granulation Amount: Medium (34-66%) Exposed Structure Granulation Quality: Red, Pink Fascia Exposed: No Necrotic Amount: Medium (  34-66%) Fat Layer (Subcutaneous Tissue) Exposed: Yes Necrotic Quality: Adherent Slough Tendon Exposed: No Muscle Exposed: No Joint Exposed: No Bone Exposed: No Treatment Notes Wound #8 (Left Toe Third) 3. Primary Dressing Applied Santyl 4. Secondary Dressing Dry Gauze 5. Secured With Tape Notes netting. Electronic Signature(s) Signed: 04/19/2020 5:13:43 PM By: [name] Entered By: [name] on 04/19/2020  09:07:07 -------------------------------------------------------------------------------- Vitals Details Patient Name: Date of Service: [name], [place] [name] 04/19/2020 8:45 A M Medical Record Number: [id number] Patient Account Number: [id number] Date of Birth/Sex: Treating RN: 02-Apr-1951 (69 y.o. [name] Primary Care Derrian Poli: [name] RD, [name] Other Clinician: Referring Omni Dunsworth: Treating Cambell Stanek/Extender: [name] RD, [name] Weeks in Treatment: 10 Vital Signs Time Taken: 09:00 Temperature (F): 98.9 Height (in): 67 Pulse (bpm): 84 Weight (lbs): 202 Respiratory Rate (breaths/min): 18 Body Mass Index (BMI): 31.6 Blood Pressure (mmHg): 91/52 Capillary Blood Glucose (mg/dl): 141 Reference Range: 80 - 120 mg / dl Notes Patient is asymptomatic. Patient denies any dizziness or lightheadedness related to low BP. PA and case manager made aware. Electronic Signature(s) Signed: 04/19/2020 5:13:43 PM By: [name] Entered By: [name] on 04/19/2020 18:55:01

## 2020-04-19 NOTE — Progress Notes (Addendum)
[name], [name] ([id number]) Visit Report for 04/19/2020 Chief Complaint Document Details Patient Name: Date of Service: [facility] [name], [name] 04/19/2020 8:45 A M Medical Record Number: [id number] Patient Account Number: [id number] Date of Birth/Sex: Treating RN: [birth year] (69 y.o. [name] Primary Care Provider: [place], [name] Other Clinician: Referring Provider: Treating Provider/Extender: [name] RD, [name] Weeks in Treatment: 10 Information Obtained from: Patient Chief Complaint Multiple LE Ulcers Electronic Signature(s) Signed: 04/19/2020 9:04:09 AM By: [name] PA-C Entered By: [name] on 04/19/2020 09:04:09 -------------------------------------------------------------------------------- Debridement Details Patient Name: Date of Service: [name], [place] [name] 04/19/2020 8:45 A M Medical Record Number: [id number] Patient Account Number: [id number] Date of Birth/Sex: Treating RN: 1950/11/11 (69 y.o. [name] Primary Care Provider: [place], [name] Other Clinician: Referring Provider: Treating Provider/Extender: [name] RD, [name] Weeks in Treatment: 10 Debridement Performed for Assessment: Wound #8 Left T Third oe Performed By: Clinician [name], RN Debridement Type: Chemical/Enzymatic/Mechanical Agent Used: Santyl Severity of Tissue Pre Debridement: Fat layer exposed Level of Consciousness (Pre-procedure): Awake and Alert Pre-procedure Verification/Time Out Yes - 09:33 Taken: Bleeding: None Response to Treatment: Procedure was tolerated well Level of Consciousness (Post- Awake and Alert procedure): Post Debridement Measurements of Total Wound Length: (cm) 0.9 Width: (cm) 0.9 Depth: (cm) 0.1 Volume: (cm) 0.064 Character of Wound/Ulcer Post Debridement: Requires Further Debridement Severity of Tissue Post Debridement: Fat layer exposed Post Procedure Diagnosis Same as Pre-procedure Electronic  Signature(s) Signed: 04/19/2020 5:36:27 PM By: [name] RN, BSN Signed: 04/19/2020 5:47:16 PM By: [name] PA-C Entered By: [name] on 04/19/2020 09:34:04 -------------------------------------------------------------------------------- HPI Details Patient Name: Date of Service: [name], [name] [name] 04/19/2020 8:45 A M Medical Record Number: [id number] Patient Account Number: [id number] Date of Birth/Sex: Treating RN: 1951/02/16 (69 y.o. [name] Primary Care Provider: [name] RD, [name] Other Clinician: Referring Provider: Treating Provider/Extender: [name] RD, [name] Weeks in Treatment: 10 History of Present Illness HPI Description: Evaluate7/21/2021 today patient presents for evaluation of ulcerations on her legs which she tells me tend to come and go as far as small blistering locations and sometimes are better than other times. Right now she tells me that this is actually doing a little bit better but nonetheless will not completely go away. She has ulcerations bilaterally on her lower extremities. The right is worse than the left. 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 The patient has no evidence of active infection at this time. She however appears to have proficient blood flow into the extremities and I think would tolerate a 3 layer compression wrap she was noncompressible on arterial studies. Nonetheless there was no obvious signs of occlusion. 02/16/2020 on evaluation today patient appears to be doing much better in regard to her wounds in general. On the past week she has made significant improvements which is great news there is no signs of active infection at this time. No fevers, chills, nausea, vomiting, or diarrhea. 02/23/2020 on evaluation today patient appears to be doing well in regard to her lower extremity ulcers and  ankle ulcers. Overall I feel like she is making great progress and in general I am extremely pleased with how things stand. There is no sign of active infection at this time which is also good news. She will require slight debridement around  the right ankle region. 03/02/20-Patient returns at 1 week, has few new areas open on the left leg, especially 1 healed area on the medial ankle that has slight small open area now, to anterior leg areas that started out as blisters that are open now. She was using juxta lite compression to the left, she used Band-Aid to cover the vesicle that had ruptured on the left anterior leg, we are using 3 layer compression on the right. We are using silver alginate to the toe wound on the left. 03/08/2020 upon evaluation today patient unfortunately is doing much worse in regard to her right leg although her left leg looks like is doing better. Again we have taken her compression wraps off last week since she was doing well and transitioned her to her Velcro compression wrap. However apparently the patient is stating this was not put on here in the clinic and she never put it on at home. With that being said unfortunately she developed swelling and opened up new wounds which are present today she has quite a few of them in fact. 03/15/2020 on evaluation today patient appears to be doing well with regard to her wounds on the lower extremities bilaterally. Fortunately there is no signs of active infection at this time. No fever chills noted. Overall I am actually very pleased with where things stand currently. 03/22/2020 on evaluation today patient appears to be doing fairly well in regard to her wounds in general. She just has really one area left on her toe everything else is healed on her legs. She does have her Velcro compression wraps which I think are appropriate at this point. Fortunately there is no signs of active infection at this time. No fevers, chills, nausea,  vomiting, or diarrhea. 03/29/2020 on evaluation today patient appears to be doing poorly in regard to her right leg which is pretty much completely reopened compared to last week. She states it was okay until Saturday when she woke up she had blisters. With that being said it appears that this is likely stemming from the fact that the patient is apparently though I just found this out today going to sleep in her bed with her legs up on the bed with her but throughout the night she tends to end up with her legs hanging off the side or bottom of the bed on the floor. With that being said I think that this likely is happening much too long and then she has significant swelling the builds up as she sleeps. Obviously I think this is the reasoning behind what we are seeing currently. 04/05/2020 on evaluation today patient appears to be doing quite well with regard to her leg ulcers at this point. Fortunately there is no signs of anything significantly open again which is great news nonetheless I am to continue wrapping her for at least a couple of weeks due to the fact that she seems to continually reopen as soon as we unwrapped her. 04/12/2020 upon evaluation today patient appears to be doing excellent in regard to her leg ulcers all appear to be improving which is great news. She is also doing better in regard to the toe the infection seems to be under better control and the Santyl is helping to clean this up. Overall very pleased much more so than what I saw last week. 04/19/2020 on evaluation today patient appears to be doing well with regard to her leg ulcers which are showing no signs of opening at this  point. Her toe is also showing signs of improvement she is about done with her antibiotic. Overall I feel like things are doing quite well. Electronic Signature(s) Signed: 04/19/2020 9:37:29 AM By: [name] PA-C Entered By: [name] on 04/19/2020  09:37:28 -------------------------------------------------------------------------------- Physical Exam Details Patient Name: Date of Service: [facility] [name], [place] [name] 04/19/2020 8:45 A M Medical Record Number: [id number] Patient Account Number: [id number] Date of Birth/Sex: Treating RN: 1950/08/10 (69 y.o. [name] Primary Care Provider: [name] RD, [name] Other Clinician: Referring Provider: Treating Provider/Extender: [name] RD, [name] Weeks in Treatment: 10 Constitutional Well-nourished and well-hydrated in no acute distress. Respiratory normal breathing without difficulty. Psychiatric this patient is able to make decisions and demonstrates good insight into disease process. Alert and Oriented x 3. pleasant and cooperative. Notes Upon inspection patient's wound bed actually showed signs of good granulation at this time there does not appear to be any evidence of active infection which is great news and overall I am extremely pleased with where we stand. Electronic Signature(s) Signed: 04/19/2020 9:37:42 AM By: [name] PA-C Entered By: [name] on 04/19/2020 09:37:41 -------------------------------------------------------------------------------- Physician Orders Details Patient Name: Date of Service: [facility] [name], [place] [name] 04/19/2020 8:45 A M Medical Record Number: [id number] Patient Account Number: [id number] Date of Birth/Sex: Treating RN: 01/15/1951 (69 y.o. [name] Primary Care Provider: [name] RD, [name] Other Clinician: Referring Provider: Treating Provider/Extender: [name] RD, [name] Weeks in Treatment: 10 Verbal / Phone Orders: No Diagnosis Coding ICD-10 Coding Code Description I87.2 Venous insufficiency (chronic) (peripheral) E11.621 Type 2 diabetes mellitus with foot ulcer L97.822 Non-pressure chronic ulcer of other part of left lower leg with fat layer exposed L97.312 Non-pressure chronic ulcer of right ankle  with fat layer exposed L97.812 Non-pressure chronic ulcer of other part of right lower leg with fat layer exposed L97.512 Non-pressure chronic ulcer of other part of right foot with fat layer exposed N18.6 End stage renal disease Z99.2 Dependence on renal dialysis I10 Essential (primary) hypertension Follow-up Appointments Return Appointment in 1 week. Dressing Change Frequency Wound #8 Left T Third oe Change dressing every day. Skin Barriers/Peri-Wound Care Moisturizing lotion - to both legs daily Wound Cleansing Wound #8 Left T Third oe Clean wound with Normal Saline. May shower and wash wound with soap and water. Primary Wound Dressing Wound #8 Left T Third oe Santyl Ointment Secondary Dressing Wound #8 Left T Third oe Kerlix/Rolled Gauze Dry Gauze Edema Control Avoid standing for long periods of time Elevate legs to the level of the heart or above for 30 minutes daily and/or when sitting, a frequency of: - throughout the day Exercise regularly Support Garment 20-30 mm/Hg pressure to: - juxtalite compression garments both legs daily Electronic Signature(s) Signed: 04/19/2020 5:36:27 PM By: [name] RN, BSN Signed: 04/19/2020 5:47:16 PM By: [name] PA-C Entered By: [name] on 04/19/2020 09:35:35 -------------------------------------------------------------------------------- Problem List Details Patient Name: Date of Service: [facility] [name], [place] [name] 04/19/2020 8:45 A M Medical Record Number: [id number] Patient Account Number: [id number] Date of Birth/Sex: Treating RN: 10/05/1950 (69 y.o. [name] Primary Care Provider: [name] RD, [name] Other Clinician: Referring Provider: Treating Provider/Extender: [name] RD, [name] Weeks in Treatment: 10 Active Problems ICD-10 Encounter Code Description Active Date MDM Diagnosis I87.2 Venous insufficiency (chronic) (peripheral) 02/09/2020 No Yes E11.621 Type 2 diabetes mellitus with  foot ulcer 02/09/2020 No Yes L97.822 Non-pressure chronic  ulcer of other part of left lower leg with fat layer exposed7/21/2021 No Yes L97.312 Non-pressure chronic ulcer of right ankle with fat layer exposed 02/09/2020 No Yes L97.812 Non-pressure chronic ulcer of other part of right lower leg with fat layer 02/09/2020 No Yes exposed L97.512 Non-pressure chronic ulcer of other part of right foot with fat layer exposed 02/09/2020 No Yes N18.6 End stage renal disease 02/09/2020 No Yes Z99.2 Dependence on renal dialysis 02/09/2020 No Yes I10 Essential (primary) hypertension 02/09/2020 No Yes Inactive Problems Resolved Problems Electronic Signature(s) Signed: 04/19/2020 9:04:04 AM By: [name] PA-C Entered By: [name] on 04/19/2020 09:04:04 -------------------------------------------------------------------------------- Progress Note Details Patient Name: Date of Service: [facility] [name], [place] [name] 04/19/2020 8:45 A M Medical Record Number: [id number] Patient Account Number: [id number] Date of Birth/Sex: Treating RN: 1951/07/04 (69 y.o. [name] Primary Care Provider: [name] RD, [name] Other Clinician: Referring Provider: Treating Provider/Extender: [name] RD, [name] Weeks in Treatment: 10 Subjective Chief Complaint Information obtained from Patient Multiple LE Ulcers History of Present Illness (HPI) Evaluate7/21/2021 today patient presents for evaluation of ulcerations on her legs which she tells me tend to come and go as far as small blistering locations and sometimes are better than other times. Right now she tells me that this is actually doing a little bit better but nonetheless will not completely go away. She has ulcerations bilaterally on her lower extremities. The right is worse than the left. 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 The  patient has no evidence of active infection at this time. She however appears to have proficient blood flow into the extremities and I think would tolerate a 3 layer compression wrap she was noncompressible on arterial studies. Nonetheless there was no obvious signs of occlusion. 02/16/2020 on evaluation today patient appears to be doing much better in regard to her wounds in general. On the past week she has made significant improvements which is great news there is no signs of active infection at this time. No fevers, chills, nausea, vomiting, or diarrhea. 02/23/2020 on evaluation today patient appears to be doing well in regard to her lower extremity ulcers and ankle ulcers. Overall I feel like she is making great progress and in general I am extremely pleased with how things stand. There is no sign of active infection at this time which is also good news. She will require slight debridement around the right ankle region. 03/02/20-Patient returns at 1 week, has few new areas open on the left leg, especially 1 healed area on the medial ankle that has slight small open area now, to anterior leg areas that started out as blisters that are open now. She was using juxta lite compression to the left, she used Band-Aid to cover the vesicle that had ruptured on the left anterior leg, we are using 3 layer compression on the right. We are using silver alginate to the toe wound on the left. 03/08/2020 upon evaluation today patient unfortunately is doing much worse in regard to her right leg although her left leg looks like is doing better. Again we have taken her compression wraps off last week since she was doing well and transitioned her to her Velcro compression wrap. However apparently the patient is stating this was not put on here in the clinic and she never put it on at home. With that being said unfortunately  she developed swelling and opened up new wounds which are present today she has quite a few of them  in fact. 03/15/2020 on evaluation today patient appears to be doing well with regard to her wounds on the lower extremities bilaterally. Fortunately there is no signs of active infection at this time. No fever chills noted. Overall I am actually very pleased with where things stand currently. 03/22/2020 on evaluation today patient appears to be doing fairly well in regard to her wounds in general. She just has really one area left on her toe everything else is healed on her legs. She does have her Velcro compression wraps which I think are appropriate at this point. Fortunately there is no signs of active infection at this time. No fevers, chills, nausea, vomiting, or diarrhea. 03/29/2020 on evaluation today patient appears to be doing poorly in regard to her right leg which is pretty much completely reopened compared to last week. She states it was okay until Saturday when she woke up she had blisters. With that being said it appears that this is likely stemming from the fact that the patient is apparently though I just found this out today going to sleep in her bed with her legs up on the bed with her but throughout the night she tends to end up with her legs hanging off the side or bottom of the bed on the floor. With that being said I think that this likely is happening much too long and then she has significant swelling the builds up as she sleeps. Obviously I think this is the reasoning behind what we are seeing currently. 04/05/2020 on evaluation today patient appears to be doing quite well with regard to her leg ulcers at this point. Fortunately there is no signs of anything significantly open again which is great news nonetheless I am to continue wrapping her for at least a couple of weeks due to the fact that she seems to continually reopen as soon as we unwrapped her. 04/12/2020 upon evaluation today patient appears to be doing excellent in regard to her leg ulcers all appear to be improving which  is great news. She is also doing better in regard to the toe the infection seems to be under better control and the Santyl is helping to clean this up. Overall very pleased much more so than what I saw last week. 04/19/2020 on evaluation today patient appears to be doing well with regard to her leg ulcers which are showing no signs of opening at this point. Her toe is also showing signs of improvement she is about done with her antibiotic. Overall I feel like things are doing quite well. Objective Constitutional Well-nourished and well-hydrated in no acute distress. Vitals Time Taken: 9:00 AM, Height: 67 in, Weight: 202 lbs, BMI: 31.6, Temperature: 98.9 F, Pulse: 84 bpm, Respiratory Rate: 18 breaths/min, Blood Pressure: 91/52 mmHg, Capillary Blood Glucose: 141 mg/dl. General Notes: Patient is asymptomatic. Patient denies any dizziness or lightheadedness related to low BP. PA and case manager made aware. Respiratory normal breathing without difficulty. Psychiatric this patient is able to make decisions and demonstrates good insight into disease process. Alert and Oriented x 3. pleasant and cooperative. General Notes: Upon inspection patient's wound bed actually showed signs of good granulation at this time there does not appear to be any evidence of active infection which is great news and overall I am extremely pleased with where we stand. Integumentary (Hair, Skin) Wound #8 status is Open. Original cause  of wound was Blister. The wound is located on the Left T Third. The wound measures 0.9cm length x 0.9cm width x oe 0.1cm depth; 0.636cm^2 area and 0.064cm^3 volume. There is Fat Layer (Subcutaneous Tissue) exposed. There is no tunneling or undermining noted. There is a small amount of serous drainage noted. The wound margin is flat and intact. There is medium (34-66%) red, pink granulation within the wound bed. There is a medium (34-66%) amount of necrotic tissue within the wound bed  including Adherent Slough. 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 There was a Chemical/Enzymatic/Mechanical debridement performed by [name], RN.Marland Kitchen Agent used was [employer]. A time out was conducted at 09:33, prior to the start of the procedure. There was no bleeding. The procedure was tolerated well. Post Debridement Measurements: 0.9cm length x 0.9cm width x 0.1cm depth; 0.064cm^3 volume. Character of Wound/Ulcer Post Debridement requires further debridement. Severity of Tissue Post Debridement is: Fat layer exposed. Post procedure Diagnosis Wound #8: Same as Pre-Procedure Plan Follow-up Appointments: Return Appointment in 1 week. Dressing Change Frequency: Wound #8 Left T Third: oe Change dressing every day. Skin Barriers/Peri-Wound Care: Moisturizing lotion - to both legs daily Wound Cleansing: Wound #8 Left T Third: oe Clean wound with Normal Saline. May shower and wash wound with soap and water. Primary Wound Dressing: Wound #8 Left T Third: oe Santyl Ointment Secondary Dressing: Wound #8 Left T Third: oe Kerlix/Rolled Gauze Dry Gauze Edema Control: Avoid standing for long periods of time Elevate legs to the level of the heart or above for 30 minutes daily and/or when sitting, a frequency of: -  throughout the day Exercise regularly Support Garment 20-30 mm/Hg pressure to: - juxtalite compression garments both legs daily 1. I am going to suggest that we continue with the wound care measures as before patient is in agreement with that plan. This includes using the Santyl over the toe ulcer at this point and then subsequently we will use roll gauze and dry gauze to secure. 2. With regard to her leg she is going to transition down to her Velcro compression wraps as of utmost importance that she keep these on and in place. 3. I am also can recommend she needs to be elevating her legs she should do this anytime she is sitting down. We will see patient back for reevaluation in 1 week here in the clinic. If anything worsens or changes patient will contact our office for additional recommendations. Electronic Signature(s) Signed: 04/19/2020 9:38:27 AM By: [name] PA-C Entered By: [name] on 04/19/2020 09:38:26 -------------------------------------------------------------------------------- SuperBill Details Patient Name: Date of Service: [facility] [name], [place] [name] 04/19/2020 Medical Record Number: [id number] Patient Account Number: [id number] Date of Birth/Sex: Treating RN: Apr 13, 1951 (69 y.o. [name] Primary Care Provider: [place], [name] Other Clinician: Referring Provider: Treating Provider/Extender: [name] RD, [name] Weeks in Treatment: 10 Diagnosis  Coding ICD-10 Codes Code Description I87.2 Venous insufficiency (chronic) (peripheral) E11.621 Type 2 diabetes mellitus with foot ulcer L97.822 Non-pressure chronic ulcer of other part of left lower leg with fat layer exposed L97.312 Non-pressure chronic ulcer of right ankle with fat layer exposed L97.812 Non-pressure chronic ulcer of other part of right lower leg with fat layer exposed L97.512 Non-pressure chronic ulcer of other part of right foot with fat layer exposed N18.6 End stage renal  disease Z99.2 Dependence on renal dialysis I10 Essential (primary) hypertension Facility Procedures CPT4 Code: [id number] Description: 7602 - DEBRIDE W/O ANES NON SELECT Modifier: Quantity: 1 Physician Procedures : CPT4 Code Description Modifier [id number] - WC PHYS LEVEL 3 - EST PT ICD-10 Diagnosis Description I87.2 Venous insufficiency (chronic) (peripheral) E11.621 Type 2 diabetes mellitus with foot ulcer L97.822 Non-pressure chronic ulcer of other part of  left lower leg with fat layer exposed L97.312 Non-pressure chronic ulcer of right ankle with fat layer exposed Quantity: 1 Electronic Signature(s) Signed: 04/19/2020 9:38:37 AM By: [name] PA-C Entered By: [name] on 04/19/2020 09:38:37

## 2020-04-20 DIAGNOSIS — D631 Anemia in chronic kidney disease: Secondary | ICD-10-CM | POA: Diagnosis not present

## 2020-04-20 DIAGNOSIS — Z992 Dependence on renal dialysis: Secondary | ICD-10-CM | POA: Diagnosis not present

## 2020-04-20 DIAGNOSIS — N186 End stage renal disease: Secondary | ICD-10-CM | POA: Diagnosis not present

## 2020-04-20 DIAGNOSIS — I129 Hypertensive chronic kidney disease with stage 1 through stage 4 chronic kidney disease, or unspecified chronic kidney disease: Secondary | ICD-10-CM | POA: Diagnosis not present

## 2020-04-20 DIAGNOSIS — D689 Coagulation defect, unspecified: Secondary | ICD-10-CM | POA: Diagnosis not present

## 2020-04-20 DIAGNOSIS — N2581 Secondary hyperparathyroidism of renal origin: Secondary | ICD-10-CM | POA: Diagnosis not present

## 2020-04-22 DIAGNOSIS — E1129 Type 2 diabetes mellitus with other diabetic kidney complication: Secondary | ICD-10-CM | POA: Diagnosis not present

## 2020-04-22 DIAGNOSIS — D631 Anemia in chronic kidney disease: Secondary | ICD-10-CM | POA: Diagnosis not present

## 2020-04-22 DIAGNOSIS — Z23 Encounter for immunization: Secondary | ICD-10-CM | POA: Diagnosis not present

## 2020-04-22 DIAGNOSIS — D689 Coagulation defect, unspecified: Secondary | ICD-10-CM | POA: Diagnosis not present

## 2020-04-22 DIAGNOSIS — Z992 Dependence on renal dialysis: Secondary | ICD-10-CM | POA: Diagnosis not present

## 2020-04-22 DIAGNOSIS — N2581 Secondary hyperparathyroidism of renal origin: Secondary | ICD-10-CM | POA: Diagnosis not present

## 2020-04-22 DIAGNOSIS — R300 Dysuria: Secondary | ICD-10-CM | POA: Diagnosis not present

## 2020-04-22 DIAGNOSIS — N186 End stage renal disease: Secondary | ICD-10-CM | POA: Diagnosis not present

## 2020-04-25 ENCOUNTER — Other Ambulatory Visit: Payer: Self-pay

## 2020-04-25 ENCOUNTER — Ambulatory Visit (INDEPENDENT_AMBULATORY_CARE_PROVIDER_SITE_OTHER): Payer: Medicare Other | Admitting: Orthotics

## 2020-04-25 DIAGNOSIS — Z23 Encounter for immunization: Secondary | ICD-10-CM | POA: Diagnosis not present

## 2020-04-25 DIAGNOSIS — N186 End stage renal disease: Secondary | ICD-10-CM | POA: Diagnosis not present

## 2020-04-25 DIAGNOSIS — L97512 Non-pressure chronic ulcer of other part of right foot with fat layer exposed: Secondary | ICD-10-CM | POA: Diagnosis not present

## 2020-04-25 DIAGNOSIS — M86171 Other acute osteomyelitis, right ankle and foot: Secondary | ICD-10-CM | POA: Diagnosis not present

## 2020-04-25 DIAGNOSIS — E0843 Diabetes mellitus due to underlying condition with diabetic autonomic (poly)neuropathy: Secondary | ICD-10-CM | POA: Diagnosis not present

## 2020-04-25 DIAGNOSIS — M86172 Other acute osteomyelitis, left ankle and foot: Secondary | ICD-10-CM | POA: Diagnosis not present

## 2020-04-25 DIAGNOSIS — E1129 Type 2 diabetes mellitus with other diabetic kidney complication: Secondary | ICD-10-CM | POA: Diagnosis not present

## 2020-04-25 DIAGNOSIS — R300 Dysuria: Secondary | ICD-10-CM | POA: Diagnosis not present

## 2020-04-25 DIAGNOSIS — D689 Coagulation defect, unspecified: Secondary | ICD-10-CM | POA: Diagnosis not present

## 2020-04-25 DIAGNOSIS — M869 Osteomyelitis, unspecified: Secondary | ICD-10-CM

## 2020-04-25 DIAGNOSIS — D631 Anemia in chronic kidney disease: Secondary | ICD-10-CM | POA: Diagnosis not present

## 2020-04-25 DIAGNOSIS — N2581 Secondary hyperparathyroidism of renal origin: Secondary | ICD-10-CM | POA: Diagnosis not present

## 2020-04-25 DIAGNOSIS — Z992 Dependence on renal dialysis: Secondary | ICD-10-CM | POA: Diagnosis not present

## 2020-04-25 NOTE — Progress Notes (Signed)

## 2020-04-26 ENCOUNTER — Encounter (HOSPITAL_BASED_OUTPATIENT_CLINIC_OR_DEPARTMENT_OTHER): Payer: Medicare Other | Attending: Physician Assistant | Admitting: Physician Assistant

## 2020-04-26 DIAGNOSIS — E1122 Type 2 diabetes mellitus with diabetic chronic kidney disease: Secondary | ICD-10-CM | POA: Diagnosis not present

## 2020-04-26 DIAGNOSIS — Z992 Dependence on renal dialysis: Secondary | ICD-10-CM | POA: Diagnosis not present

## 2020-04-26 DIAGNOSIS — I872 Venous insufficiency (chronic) (peripheral): Secondary | ICD-10-CM | POA: Insufficient documentation

## 2020-04-26 DIAGNOSIS — L97522 Non-pressure chronic ulcer of other part of left foot with fat layer exposed: Secondary | ICD-10-CM | POA: Diagnosis not present

## 2020-04-26 DIAGNOSIS — N186 End stage renal disease: Secondary | ICD-10-CM | POA: Insufficient documentation

## 2020-04-26 DIAGNOSIS — L97812 Non-pressure chronic ulcer of other part of right lower leg with fat layer exposed: Secondary | ICD-10-CM | POA: Insufficient documentation

## 2020-04-26 DIAGNOSIS — L97512 Non-pressure chronic ulcer of other part of right foot with fat layer exposed: Secondary | ICD-10-CM | POA: Diagnosis not present

## 2020-04-26 DIAGNOSIS — I120 Hypertensive chronic kidney disease with stage 5 chronic kidney disease or end stage renal disease: Secondary | ICD-10-CM | POA: Insufficient documentation

## 2020-04-26 DIAGNOSIS — L97822 Non-pressure chronic ulcer of other part of left lower leg with fat layer exposed: Secondary | ICD-10-CM | POA: Diagnosis not present

## 2020-04-26 DIAGNOSIS — E11621 Type 2 diabetes mellitus with foot ulcer: Secondary | ICD-10-CM | POA: Diagnosis not present

## 2020-04-26 DIAGNOSIS — L97312 Non-pressure chronic ulcer of right ankle with fat layer exposed: Secondary | ICD-10-CM | POA: Insufficient documentation

## 2020-04-26 NOTE — Progress Notes (Addendum)
[name], [name] ([id number]) Visit Report for 04/26/2020 Chief Complaint Document Details Patient Name: Date of Service: [name] [name], [name] 04/26/2020 1:00 PM Medical Record Number: [id number] Patient Account Number: [id number] Date of Birth/Sex: Treating RN: Jan 19, 1951 (69 y.o. [name] Primary Care Provider: [name] Other Clinician: Referring Provider: Treating Provider/Extender: Christella Hartigan in Treatment: 11 Information Obtained from: Patient Chief Complaint Multiple LE Ulcers Electronic Signature(s) Signed: 04/26/2020 1:45:37 PM By: [name] PA-C Entered By: [name] on 04/26/2020 13:45:37 -------------------------------------------------------------------------------- HPI Details Patient Name: Date of Service: [name], [place] [name] 04/26/2020 1:00 PM Medical Record Number: [id number] Patient Account Number: [id number] Date of Birth/Sex: Treating RN: July 10, 1951 (69 y.o. [name] Primary Care Provider: [name] Other Clinician: Referring Provider: Treating Provider/Extender: Christella Hartigan in Treatment: 11 History of Present Illness HPI Description: Evaluate7/21/2021 today patient presents for evaluation of ulcerations on her legs which she tells me tend to come and go as far as small blistering locations and sometimes are better than other times. Right now she tells me that this is actually doing a little bit better but nonetheless will not completely go away. She has ulcerations bilaterally on her lower extremities. The right is worse than the left. 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 The patient has no evidence of active infection at this time. She however appears to have proficient blood flow into the extremities and I think would tolerate a 3 layer compression wrap she  was noncompressible on arterial studies. Nonetheless there was no obvious signs of occlusion. 02/16/2020 on evaluation today patient appears to be doing much better in regard to her wounds in general. On the past week she has made significant improvements which is great news there is no signs of active infection at this time. No fevers, chills, nausea, vomiting, or diarrhea. 02/23/2020 on evaluation today patient appears to be doing well in regard to her lower extremity ulcers and ankle ulcers. Overall I feel like she is making great progress and in general I am extremely pleased with how things stand. There is no sign of active infection at this time which is also good news. She will require slight debridement around the right ankle region. 03/02/20-Patient returns at 1 week, has few new areas open on the left leg, especially 1 healed area on the medial ankle that has slight small open area now, to anterior leg areas that started out as blisters that are open now. She was using juxta lite compression to the left, she used Band-Aid to cover the vesicle that had ruptured on the left anterior leg, we are using 3 layer compression on the right. We are using silver alginate to the toe wound on the left. 03/08/2020 upon evaluation today patient unfortunately is doing much worse in regard to her right leg although her left leg looks like is doing better. Again we have taken her compression wraps off last week since she was doing well and transitioned her to her Velcro compression wrap. However apparently the patient is stating this was not put on here in the clinic and she never put it on at home. With that being said unfortunately she developed swelling and opened up new wounds which are present today she has quite a few of them in fact. 03/15/2020 on evaluation today patient appears to be doing well with  regard to her wounds on the lower extremities bilaterally. Fortunately there is no signs of active infection  at this time. No fever chills noted. Overall I am actually very pleased with where things stand currently. 03/22/2020 on evaluation today patient appears to be doing fairly well in regard to her wounds in general. She just has really one area left on her toe everything else is healed on her legs. She does have her Velcro compression wraps which I think are appropriate at this point. Fortunately there is no signs of active infection at this time. No fevers, chills, nausea, vomiting, or diarrhea. 03/29/2020 on evaluation today patient appears to be doing poorly in regard to her right leg which is pretty much completely reopened compared to last week. She states it was okay until Saturday when she woke up she had blisters. With that being said it appears that this is likely stemming from the fact that the patient is apparently though I just found this out today going to sleep in her bed with her legs up on the bed with her but throughout the night she tends to end up with her legs hanging off the side or bottom of the bed on the floor. With that being said I think that this likely is happening much too long and then she has significant swelling the builds up as she sleeps. Obviously I think this is the reasoning behind what we are seeing currently. 04/05/2020 on evaluation today patient appears to be doing quite well with regard to her leg ulcers at this point. Fortunately there is no signs of anything significantly open again which is great news nonetheless I am to continue wrapping her for at least a couple of weeks due to the fact that she seems to continually reopen as soon as we unwrapped her. 04/12/2020 upon evaluation today patient appears to be doing excellent in regard to her leg ulcers all appear to be improving which is great news. She is also doing better in regard to the toe the infection seems to be under better control and the Santyl is helping to clean this up. Overall very pleased much more  so than what I saw last week. 04/19/2020 on evaluation today patient appears to be doing well with regard to her leg ulcers which are showing no signs of opening at this point. Her toe is also showing signs of improvement she is about done with her antibiotic. Overall I feel like things are doing quite well. 04/26/2020 upon evaluation today patient appears to be doing unfortunately a little bit worse compared to previous. She has reopened wounds on the right anterior lower extremity. This is unfortunate as we were hoping that this would really maintain healing. That does not appear to be the case. I think with him the need to see about making a referral for venous studies and provider evaluation with [facility] imaging with the interventional radiologist. Electronic Signature(s) Signed: 04/26/2020 2:11:33 PM By: [name] PA-C Entered By: [name] on 04/26/2020 14:11:33 -------------------------------------------------------------------------------- Physical Exam Details Patient Name: Date of Service: [facility] [name], [place] [name] 04/26/2020 1:00 PM Medical Record Number: [id number] Patient Account Number: [id number] Date of Birth/Sex: Treating RN: 22-Oct-1950 (69 y.o. [name] Primary Care Provider: [name] Other Clinician: Referring Provider: Treating Provider/Extender: Christella Hartigan in Treatment: 98 Constitutional Well-nourished and well-hydrated in no acute distress. Cardiovascular 2+ dorsalis pedis/posterior tibialis pulses. 1+ pitting edema of the bilateral lower extremities. Psychiatric this patient  is able to make decisions and demonstrates good insight into disease process. Alert and Oriented x 3. pleasant and cooperative. Notes Upon inspection patient's wounds again appear to be fairly mild I feel like the toe is doing better and I feel like the leg is actually doing okay at this point. The does not appear to be any signs of active infection  which is great news. The leg on the right unfortunately did reopen despite using her compression she tells me. She says that she has been using the Velcro compression wraps regularly try to elevate her legs and is sleeping in the bed. All this however apparently did not work for her. Electronic Signature(s) Signed: 04/26/2020 2:12:24 PM By: [name] PA-C Entered By: [name] on 04/26/2020 14:12:24 -------------------------------------------------------------------------------- Physician Orders Details Patient Name: Date of Service: [facility] [name], [place] [name] 04/26/2020 1:00 PM Medical Record Number: [id number] Patient Account Number: [id number] Date of Birth/Sex: Treating RN: Dec 05, 1950 (69 y.o. [name] Primary Care Provider: [name] Other Clinician: Referring Provider: Treating Provider/Extender: Christella Hartigan in Treatment: 11 Verbal / Phone Orders: No Diagnosis Coding ICD-10 Coding Code Description I87.2 Venous insufficiency (chronic) (peripheral) E11.621 Type 2 diabetes mellitus with foot ulcer L97.822 Non-pressure chronic ulcer of other part of left lower leg with fat layer exposed L97.312 Non-pressure chronic ulcer of right ankle with fat layer exposed L97.812 Non-pressure chronic ulcer of other part of right lower leg with fat layer exposed L97.512 Non-pressure chronic ulcer of other part of right foot with fat layer exposed N18.6 End stage renal disease Z99.2 Dependence on renal dialysis I10 Essential (primary) hypertension Follow-up Appointments ppointment in 2 weeks. - Wed. with Margarita Grizzle Return A Nurse Visit: - 1 week Dressing Change Frequency Wound #15 Right,Anterior Lower Leg Do not change entire dressing for one week. Wound #8 Left T Third oe Change Dressing every other day. Skin Barriers/Peri-Wound Care Moisturizing lotion - to both legs daily Wound Cleansing Wound #15 Right,Anterior Lower Leg May shower with  protection. Wound #8 Left T Third oe May shower and wash wound with soap and water. Primary Wound Dressing Wound #15 Right,Anterior Lower Leg Silver Collagen Wound #8 Left T Third oe Silver Collagen Secondary Dressing Wound #15 Right,Anterior Lower Leg Dry Gauze Wound #8 Left T Third oe Kerlix/Rolled Gauze Dry Gauze Edema Control Avoid standing for long periods of time Elevate legs to the level of the heart or above for 30 minutes daily and/or when sitting, a frequency of: - throughout the day Exercise regularly Support Garment 20-30 mm/Hg pressure to: - juxtalite compression garments left leg daily Services and Therapies Venous Studies -Bilateral with reflux - [facility] - bilateral lower extremity swelling with recurrent ulcers - (ICD10 D14.970 - Non-pressure chronic ulcer of other part of right lower leg with fat layer exposed) Electronic Signature(s) Signed: 04/26/2020 6:14:29 PM By: [name] RN, BSN Signed: 04/26/2020 6:38:49 PM By: [name] PA-C Entered By: [name] on 04/26/2020 14:11:22 Prescription 04/26/2020 -------------------------------------------------------------------------------- Rolan Lipa K. [name] [place] Patient Name: Provider: 1950/11/25 [id number] Date of Birth: NPI#: [id number] Sex: DEA #: [phone] Phone #: License #: [place] Patient Address: [address], [place] [address] [place], [place] [address] [phone] Allergies penicillin; adhesive tape Provider's Orders Venous Studies -Bilateral with reflux - ICD10: L97.812 - [facility] - bilateral lower extremity swelling with recurrent ulcers Hand Signature: Date(s): Electronic Signature(s)  Signed: 04/26/2020 6:14:29 PM By: [name] RN, BSN Signed: 04/26/2020 6:38:49 PM By: [name] PA-C Entered By: [name] on 04/26/2020  14:11:23 -------------------------------------------------------------------------------- Problem List Details Patient Name: Date of Service: [facility] [name], [place] [name] 04/26/2020 1:00 PM Medical Record Number: [id number] Patient Account Number: [id number] Date of Birth/Sex: Treating RN: August 25, 1950 (69 y.o. [name] Primary Care Provider: [name] Other Clinician: Referring Provider: Treating Provider/Extender: Christella Hartigan in Treatment: 11 Active Problems ICD-10 Encounter Code Description Active Date MDM Diagnosis I87.2 Venous insufficiency (chronic) (peripheral) 02/09/2020 No Yes E11.621 Type 2 diabetes mellitus with foot ulcer 02/09/2020 No Yes L97.822 Non-pressure chronic ulcer of other part of left lower leg with fat layer exposed7/21/2021 No Yes L97.312 Non-pressure chronic ulcer of right ankle with fat layer exposed 02/09/2020 No Yes L97.812 Non-pressure chronic ulcer of other part of right lower leg with fat layer 02/09/2020 No Yes exposed L97.512 Non-pressure chronic ulcer of other part of right foot with fat layer exposed 02/09/2020 No Yes N18.6 End stage renal disease 02/09/2020 No Yes Z99.2 Dependence on renal dialysis 02/09/2020 No Yes I10 Essential (primary) hypertension 02/09/2020 No Yes Inactive Problems Resolved Problems Electronic Signature(s) Signed: 04/26/2020 1:45:21 PM By: [name] PA-C Entered By: [name] on 04/26/2020 13:45:20 -------------------------------------------------------------------------------- Progress Note Details Patient Name: Date of Service: [facility] [name], [place] [name] 04/26/2020 1:00 PM Medical Record Number: [id number] Patient Account Number: [id number] Date of Birth/Sex: Treating RN: [birth year] (69 y.o. [name] Primary Care Provider: [name] Other Clinician: Referring Provider: Treating Provider/Extender: Christella Hartigan in Treatment: 11 Subjective Chief  Complaint Information obtained from Patient Multiple LE Ulcers History of Present Illness (HPI) Evaluate7/21/2021 today patient presents for evaluation of ulcerations on her legs which she tells me tend to come and go as far as small blistering locations and sometimes are better than other times. Right now she tells me that this is actually doing a little bit better but nonetheless will not completely go away. She has ulcerations bilaterally on her lower extremities. The right is worse than the left. 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 The patient has no evidence of active infection at this time. She however appears to have proficient blood flow into the extremities and I think would tolerate a 3 layer compression wrap she was noncompressible on arterial studies. Nonetheless there was no obvious signs of occlusion. 02/16/2020 on evaluation today patient appears to be doing much better in regard to her wounds in general. On the past week she has made significant improvements which is great news there is no signs of active infection at this time. No fevers, chills, nausea, vomiting, or diarrhea. 02/23/2020 on evaluation today patient appears to be doing well in regard to her lower extremity ulcers and ankle ulcers. Overall I feel like she is making great progress and in general I am extremely pleased with how things stand. There is no sign of active infection at this time which is also good news. She will require slight debridement around the right ankle region. 03/02/20-Patient returns at 1 week, has few new areas open on the left leg, especially 1 healed area on the medial ankle that has slight small open area now, to anterior leg areas that started out as blisters that are open now. She was using juxta lite compression to the left, she used Band-Aid  to cover the vesicle that had  ruptured on the left anterior leg, we are using 3 layer compression on the right. We are using silver alginate to the toe wound on the left. 03/08/2020 upon evaluation today patient unfortunately is doing much worse in regard to her right leg although her left leg looks like is doing better. Again we have taken her compression wraps off last week since she was doing well and transitioned her to her Velcro compression wrap. However apparently the patient is stating this was not put on here in the clinic and she never put it on at home. With that being said unfortunately she developed swelling and opened up new wounds which are present today she has quite a few of them in fact. 03/15/2020 on evaluation today patient appears to be doing well with regard to her wounds on the lower extremities bilaterally. Fortunately there is no signs of active infection at this time. No fever chills noted. Overall I am actually very pleased with where things stand currently. 03/22/2020 on evaluation today patient appears to be doing fairly well in regard to her wounds in general. She just has really one area left on her toe everything else is healed on her legs. She does have her Velcro compression wraps which I think are appropriate at this point. Fortunately there is no signs of active infection at this time. No fevers, chills, nausea, vomiting, or diarrhea. 03/29/2020 on evaluation today patient appears to be doing poorly in regard to her right leg which is pretty much completely reopened compared to last week. She states it was okay until Saturday when she woke up she had blisters. With that being said it appears that this is likely stemming from the fact that the patient is apparently though I just found this out today going to sleep in her bed with her legs up on the bed with her but throughout the night she tends to end up with her legs hanging off the side or bottom of the bed on the floor. With that being said I think  that this likely is happening much too long and then she has significant swelling the builds up as she sleeps. Obviously I think this is the reasoning behind what we are seeing currently. 04/05/2020 on evaluation today patient appears to be doing quite well with regard to her leg ulcers at this point. Fortunately there is no signs of anything significantly open again which is great news nonetheless I am to continue wrapping her for at least a couple of weeks due to the fact that she seems to continually reopen as soon as we unwrapped her. 04/12/2020 upon evaluation today patient appears to be doing excellent in regard to her leg ulcers all appear to be improving which is great news. She is also doing better in regard to the toe the infection seems to be under better control and the Santyl is helping to clean this up. Overall very pleased much more so than what I saw last week. 04/19/2020 on evaluation today patient appears to be doing well with regard to her leg ulcers which are showing no signs of opening at this point. Her toe is also showing signs of improvement she is about done with her antibiotic. Overall I feel like things are doing quite well. 04/26/2020 upon evaluation today patient appears to be doing unfortunately a little bit worse compared to previous. She has reopened wounds on the right anterior lower extremity. This is unfortunate as we  were hoping that this would really maintain healing. That does not appear to be the case. I think with him the need to see about making a referral for venous studies and provider evaluation with [facility] imaging with the interventional radiologist. Objective Constitutional Well-nourished and well-hydrated in no acute distress. Vitals Time Taken: 1:17 PM, Height: 67 in, Weight: 202 lbs, BMI: 31.6, Temperature: 98.3 F, Pulse: 80 bpm, Respiratory Rate: 18 breaths/min, Blood Pressure: 100/63 mmHg, Capillary Blood Glucose: 223 mg/dl. Cardiovascular 2+  dorsalis pedis/posterior tibialis pulses. 1+ pitting edema of the bilateral lower extremities. Psychiatric this patient is able to make decisions and demonstrates good insight into disease process. Alert and Oriented x 3. pleasant and cooperative. General Notes: Upon inspection patient's wounds again appear to be fairly mild I feel like the toe is doing better and I feel like the leg is actually doing okay at this point. The does not appear to be any signs of active infection which is great news. The leg on the right unfortunately did reopen despite using her compression she tells me. She says that she has been using the Velcro compression wraps regularly try to elevate her legs and is sleeping in the bed. All this however apparently did not work for her. Integumentary (Hair, Skin) Wound #15 status is Open. Original cause of wound was Gradually Appeared. The wound is located on the Right,Anterior Lower Leg. The wound measures 1.4cm length x 0.6cm width x 0.1cm depth; 0.66cm^2 area and 0.066cm^3 volume. There is Fat Layer (Subcutaneous Tissue) exposed. There is no tunneling or undermining noted. There is a medium amount of serosanguineous drainage noted. There is medium (34-66%) red, pink granulation within the wound bed. There is a medium (34-66%) amount of necrotic tissue within the wound bed including Adherent Slough. Wound #8 status is Open. Original cause of wound was Blister. The wound is located on the Left T Third. The wound measures 0.5cm length x 0.7cm width x oe 0.1cm depth; 0.275cm^2 area and 0.027cm^3 volume. There is Fat Layer (Subcutaneous Tissue) exposed. There is no tunneling or undermining noted. There is a small amount of serous drainage noted. The wound margin is flat and intact. There is medium (34-66%) red, pink granulation within the wound bed. There is a medium (34-66%) amount of necrotic tissue within the wound bed including Adherent Slough. 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 There was a Three Layer Compression Therapy Procedure by [name], RN. Post procedure Diagnosis Wound #15: Same as Pre-Procedure Plan Follow-up Appointments: Return Appointment in 2 weeks. - Wed. with Margarita Grizzle Nurse Visit: - 1 week Dressing Change Frequency: Wound #15 Right,Anterior Lower Leg: Do not change entire dressing for one week. Wound #8 Left T Third: oe Change Dressing every other day. Skin Barriers/Peri-Wound Care: Moisturizing lotion - to both legs daily Wound Cleansing: Wound #15 Right,Anterior Lower Leg: May shower with protection. Wound #8 Left T Third: oe May shower and wash wound with soap and water.  Primary Wound Dressing: Wound #15 Right,Anterior Lower Leg: Silver Collagen Wound #8 Left T Third: oe Silver Collagen Secondary Dressing: Wound #15 Right,Anterior Lower Leg: Dry Gauze Wound #8 Left T Third: oe Kerlix/Rolled Gauze Dry Gauze Edema Control: Avoid standing for long periods of time Elevate legs to the level of the heart or above for 30 minutes daily and/or when sitting, a frequency of: - throughout the day Exercise regularly Support Garment 20-30 mm/Hg pressure to: - juxtalite compression garments left leg daily Services and Therapies ordered were: Venous Studies -Bilateral with reflux - [facility] -  bilateral lower extremity swelling with recurrent ulcers 1. I would recommend at this time that we going continue with the collagen dressing which we can actually start today over the next 2 weeks for her. I think that that skin to be the best way to go at this point. 2. I am also can recommend that the patient continue with a compression wrap for the right leg we will see her for nurse visit next week I will see her in 2 weeks. 3. I am also can recommend that she needs to continue to elevate her legs much as possible try to keep edema under good control. We will see patient back for reevaluation in 2 weeks here in the clinic. If anything worsens or changes patient will contact our office for additional recommendations. We will see her for nurse visit in 1 week. Electronic Signature(s) Signed: 04/26/2020 2:12:46 PM By: [name] PA-C Entered By: [name] on 04/26/2020 14:12:46 -------------------------------------------------------------------------------- SuperBill Details Patient Name: Date of Service: [facility] [name], [place] [name] 04/26/2020 Medical Record Number: [id number] Patient Account Number: [id number] Date of Birth/Sex: Treating RN: [birth year] (70 y.o. 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 exposed L97.812 Non-pressure chronic ulcer of other part of right lower leg with fat layer exposed L97.512 Non-pressure chronic ulcer of other part of right foot with fat layer exposed N18.6 End stage renal disease Z99.2 Dependence on renal dialysis I10  Essential (primary) hypertension Facility Procedures CPT4 Code: 19417408 Description: (Facility Use Only) [phone] - [facility] RT LEG Modifier: Quantity: 1 Physician Procedures : CPT4 Code Description Modifier [id number] - WC PHYS LEVEL 3 - EST PT ICD-10 Diagnosis Description I87.2 Venous insufficiency (chronic) (peripheral) E11.621 Type 2 diabetes mellitus with foot ulcer L97.822 Non-pressure chronic ulcer of other part of  left lower leg with fat layer exposed L97.312 Non-pressure chronic ulcer of right ankle with fat layer exposed Quantity: 1 Electronic Signature(s) Signed: 04/26/2020 2:13:05 PM By: [name] PA-C Entered By: [name] on 04/26/2020 14:13:05

## 2020-04-27 ENCOUNTER — Other Ambulatory Visit: Payer: Self-pay | Admitting: Physician Assistant

## 2020-04-27 DIAGNOSIS — E1129 Type 2 diabetes mellitus with other diabetic kidney complication: Secondary | ICD-10-CM | POA: Diagnosis not present

## 2020-04-27 DIAGNOSIS — N186 End stage renal disease: Secondary | ICD-10-CM | POA: Diagnosis not present

## 2020-04-27 DIAGNOSIS — D631 Anemia in chronic kidney disease: Secondary | ICD-10-CM | POA: Diagnosis not present

## 2020-04-27 DIAGNOSIS — I872 Venous insufficiency (chronic) (peripheral): Secondary | ICD-10-CM

## 2020-04-27 DIAGNOSIS — N2581 Secondary hyperparathyroidism of renal origin: Secondary | ICD-10-CM | POA: Diagnosis not present

## 2020-04-27 DIAGNOSIS — Z992 Dependence on renal dialysis: Secondary | ICD-10-CM | POA: Diagnosis not present

## 2020-04-27 DIAGNOSIS — Z23 Encounter for immunization: Secondary | ICD-10-CM | POA: Diagnosis not present

## 2020-04-27 DIAGNOSIS — D689 Coagulation defect, unspecified: Secondary | ICD-10-CM | POA: Diagnosis not present

## 2020-04-27 DIAGNOSIS — R300 Dysuria: Secondary | ICD-10-CM | POA: Diagnosis not present

## 2020-04-27 NOTE — Progress Notes (Signed)
[name], [name] ([id number]) Visit Report for 04/26/2020 Arrival Information Details Patient Name: Date of Service: [name] [name], [name] 04/26/2020 1:00 PM Medical Record Number: [id number] Patient Account Number: [id number] Date of Birth/Sex: Treating RN: June 17, 1951 (69 y.o. [name] Primary Care Avyanna Spada: [name] Other Clinician: Referring Meli Faley: Treating Hattye Siegfried/Extender: Christella Hartigan in Treatment: 11 Visit Information History Since Last Visit Added or deleted any medications: No Patient Arrived: Wheel Chair Any new allergies or adverse reactions: No Arrival Time: 13:16 Had a fall or experienced change in No Accompanied By: self activities of daily living that may affect Transfer Assistance: None risk of falls: Patient Identification Verified: Yes Signs or symptoms of abuse/neglect since last visito No Secondary Verification Process Completed: Yes Hospitalized since last visit: No Patient Requires Transmission-Based Precautions: No Implantable device outside of the clinic excluding No Patient Has Alerts: Yes cellular tissue based products placed in the [name] Patient Alerts: Right ABI: [place] since last visit: Left ABI: [place] Has Dressing in Place as Prescribed: Yes Pain Present Now: No Electronic Signature(s) Signed: 04/27/2020 1:11:06 PM By: Sandre Kitty Entered By: Sandre Kitty on 04/26/2020 13:17:22 -------------------------------------------------------------------------------- Compression Therapy Details Patient Name: Date of Service: [name], [place] [name] 04/26/2020 1:00 PM Medical Record Number: [id number] Patient Account Number: [id number] Date of Birth/Sex: Treating RN: 09-Jul-1951 (69 y.o. [name] Primary Care Arrie Zuercher: [name] Other Clinician: Referring Lavere Shinsky: Treating Addie Cederberg/Extender: Christella Hartigan in Treatment: 11 Compression Therapy Performed for Wound Assessment: Wound  #15 Right,Anterior Lower Leg Performed By: Clinician [name], RN Compression Type: Three Layer Post Procedure Diagnosis Same as Pre-procedure Electronic Signature(s) Signed: 04/26/2020 6:14:29 PM By: [name] RN, BSN Entered By: [name] on 04/26/2020 14:07:08 -------------------------------------------------------------------------------- Encounter Discharge Information Details Patient Name: Date of Service: [name] [name], [place] [name] 04/26/2020 1:00 PM Medical Record Number: [id number] Patient Account Number: [id number] Date of Birth/Sex: Treating RN: September 02, 1950 (69 y.o. [name] Primary Care Mirel Hundal: [name] Other Clinician: Referring Shenna Brissette: Treating Analyse Angst/Extender: Christella Hartigan in Treatment: 11 Encounter Discharge Information Items Discharge Condition: Stable Ambulatory Status: Wheelchair Discharge Destination: Home Transportation: Private Auto Accompanied By: self Schedule Follow-up Appointment: Yes Clinical Summary of Care: Patient Declined Electronic Signature(s) Signed: 04/26/2020 5:53:32 PM By: [name] RN Entered By: [name] on 04/26/2020 14:31:23 -------------------------------------------------------------------------------- Lower Extremity Assessment Details Patient Name: Date of Service: [name] [name], [name] 04/26/2020 1:00 PM Medical Record Number: [id number] Patient Account Number: [id number] Date of Birth/Sex: Treating RN: Jul 05, 1951 (69 y.o. [name] Primary Care Shakenya Stoneberg: [name] Other Clinician: Referring Marit [name]: Treating Demitrus Francisco/Extender: Christella Hartigan in Treatment: 11 Edema Assessment Assessed: [Left: No] [Right: No] Edema: [Left: No] [Right: No] Calf Left: Right: Point of Measurement: 38 cm From Medial Instep 34 cm 34 cm Ankle Left: Right: Point of Measurement: 13 cm From Medial Instep 24 cm 24 cm Electronic Signature(s) Signed: 04/26/2020  5:53:32 PM By: [name] RN Entered By: [name] on 04/26/2020 13:37:18 -------------------------------------------------------------------------------- [name] Details Patient Name: Date of Service: [facility] [name], [place] [name] 04/26/2020 1:00 PM Medical Record Number: [id number] Patient Account Number: [id number] Date of Birth/Sex: Treating RN: 08-25-1950 (69 y.o. [name] Primary Care Shaunika Italiano: [name] Other Clinician: Referring Deaisha Welborn: Treating Ellawyn Wogan/Extender: Christella Hartigan in Treatment: 11 Active Inactive Venous Leg Ulcer Nursing Diagnoses: Knowledge deficit related to disease process and management Potential for venous Insuffiency (use before diagnosis  confirmed) Goals: Patient will maintain optimal edema control Date Initiated: 02/09/2020 Target Resolution Date: 05/03/2020 Goal Status: Active Patient/caregiver will verbalize understanding of disease process and disease management Date Initiated: 02/09/2020 Date Inactivated: 03/02/2020 Target Resolution Date: 03/08/2020 Goal Status: Met Interventions: Assess peripheral edema status every visit. Compression as ordered Provide education on venous insufficiency Treatment Activities: Therapeutic compression applied : 02/09/2020 Notes: Wound/Skin Impairment Nursing Diagnoses: Impaired tissue integrity Knowledge deficit related to ulceration/compromised skin integrity Goals: Patient/caregiver will verbalize understanding of skin care regimen Date Initiated: 02/09/2020 Target Resolution Date: 05/03/2020 Goal Status: Active Ulcer/skin breakdown will have a volume reduction of 30% by week 4 Date Initiated: 02/09/2020 Date Inactivated: 03/02/2020 Target Resolution Date: 03/08/2020 Goal Status: Met Interventions: Assess patient/caregiver ability to obtain necessary supplies Assess patient/caregiver ability to perform ulcer/skin care regimen upon admission and as  needed Assess ulceration(s) every visit Provide education on ulcer and skin care Treatment Activities: Skin care regimen initiated : 02/09/2020 Topical wound management initiated : 02/09/2020 Notes: Electronic Signature(s) Signed: 04/26/2020 6:14:29 PM By: [name] RN, BSN Entered By: [name] on 04/26/2020 14:03:22 -------------------------------------------------------------------------------- Pain Assessment Details Patient Name: Date of Service: [name], [place] [name] 04/26/2020 1:00 PM Medical Record Number: [id number] Patient Account Number: [id number] Date of Birth/Sex: Treating RN: 04-04-1951 (69 y.o. [name] Primary Care Hadassah Rana: [name] Other Clinician: Referring Shakiyla Kook: Treating Valicia Rief/Extender: Christella Hartigan in Treatment: 11 Active Problems Location of Pain Severity and Description of Pain Patient Has Paino No Site Locations Pain Management and Medication Current Pain Management: Electronic Signature(s) Signed: 04/26/2020 6:14:29 PM By: [name] RN, BSN Signed: 04/27/2020 1:11:06 PM By: Sandre Kitty Entered By: Sandre Kitty on 04/26/2020 13:18:14 -------------------------------------------------------------------------------- Patient/Caregiver Education Details Patient Name: Date of Service: [name] [name], [place] [name] 10/6/2021andnbsp1:00 PM Medical Record Number: [id number] Patient Account Number: [id number] Date of Birth/Gender: Treating RN: 1951/03/12 (69 y.o. [name] Primary Care Physician: [name] Other Clinician: Referring Physician: Treating Physician/Extender: Christella Hartigan in Treatment: 11 Education Assessment Education Provided To: Patient Education Topics Provided Venous: Methods: Explain/Verbal Responses: Reinforcements needed, State content correctly Wound/Skin Impairment: Methods: Explain/Verbal Responses: Reinforcements needed, State  content correctly Electronic Signature(s) Signed: 04/26/2020 6:14:29 PM By: [name] RN, BSN Entered By: [name] on 04/26/2020 14:06:39 -------------------------------------------------------------------------------- Wound Assessment Details Patient Name: Date of Service: [name], [place] [name] 04/26/2020 1:00 PM Medical Record Number: [id number] Patient Account Number: [id number] Date of Birth/Sex: Treating RN: Nov 26, 1950 (69 y.o. [name] Primary Care Morelia Cassells: [name] Other Clinician: Referring Alieah Brinton: Treating Zyire Eidson/Extender: Christella Hartigan in Treatment: 11 Wound Status Wound Number: 15 Primary Diabetic Wound/Ulcer of the Lower Extremity Etiology: Wound Location: Right, Anterior Lower Leg Wound Open Wounding Event: Gradually Appeared Status: Date Acquired: 04/26/2020 Comorbid Cataracts, Anemia, Asthma, Hypertension, Type II Diabetes, End Weeks Of Treatment: 0 History: Stage Renal Disease, Osteomyelitis, Neuropathy Clustered Wound: No Photos Photo Uploaded By: Mikeal Hawthorne on 04/27/2020 12:06:24 Wound Measurements Length: (cm) 1.4 % Width: (cm) 0.6 % Depth: (cm) 0.1 E Area: (cm) 0.66 Volume: (cm) 0.066 Reduction in Area: 0% Reduction in Volume: 0% pithelialization: None Tunneling: No Undermining: No Wound Description Classification: Grade 2 F Exudate Amount: Medium S Exudate Type: Serosanguineous Exudate Color: red, brown oul Odor After Cleansing: No lough/Fibrino Yes Wound Bed Granulation Amount: Medium (34-66%) Exposed Structure Granulation Quality: Red, Pink Fascia Exposed: No Necrotic Amount: Medium (34-66%) Fat Layer (Subcutaneous Tissue) Exposed: Yes Necrotic Quality: Adherent Slough Tendon Exposed: No  Muscle Exposed: No Joint Exposed: No Bone Exposed: No Treatment Notes Wound #15 (Right, Anterior Lower Leg) 1. Cleanse With Wound Cleanser Soap and water 3. Primary Dressing  Applied Collegen AG 4. Secondary Dressing Dry Gauze Roll Gauze Notes JUXT[place] Electronic Signature(s) Signed: 04/26/2020 5:53:32 PM By: [name] RN Signed: 04/26/2020 6:14:29 PM By: [name] RN, BSN Entered By: [name] on 04/26/2020 13:38:30 -------------------------------------------------------------------------------- Wound Assessment Details Patient Name: Date of Service: [name], [place] [name] 04/26/2020 1:00 PM Medical Record Number: [id number] Patient Account Number: [id number] Date of Birth/Sex: Treating RN: 10/22/1950 (69 y.o. [name] Primary Care Vikas Wegmann: [name] Other Clinician: Referring Kamila Broda: Treating Clark Clowdus/Extender: Christella Hartigan in Treatment: 11 Wound Status Wound Number: 8 Primary Diabetic Wound/Ulcer of the Lower Extremity Etiology: Wound Location: Left T Third oe Wound Open Wounding Event: Blister Status: Date Acquired: 02/23/2020 Comorbid Cataracts, Anemia, Asthma, Hypertension, Type II Diabetes, End Weeks Of Treatment: 9 History: Stage Renal Disease, Osteomyelitis, Neuropathy Clustered Wound: No Photos Photo Uploaded By: Mikeal Hawthorne on 04/27/2020 12:06:25 Wound Measurements Length: (cm) 0.5 Width: (cm) 0.7 Depth: (cm) 0.1 Area: (cm) 0.275 Volume: (cm) 0.027 % Reduction in Area: -95% % Reduction in Volume: -92.9% Epithelialization: Small (1-33%) Tunneling: No Undermining: No Wound Description Classification: Grade 2 Wound Margin: Flat and Intact Exudate Amount: Small Exudate Type: Serous Exudate Color: amber Foul Odor After Cleansing: No Slough/Fibrino Yes Wound Bed Granulation Amount: Medium (34-66%) Exposed Structure Granulation Quality: Red, Pink Fascia Exposed: No Necrotic Amount: Medium (34-66%) Fat Layer (Subcutaneous Tissue) Exposed: Yes Necrotic Quality: Adherent Slough Tendon Exposed: No Muscle Exposed: No Joint Exposed: No Bone Exposed: No Treatment  Notes Wound #8 (Left Toe Third) 1. Cleanse With Wound Cleanser Soap and water 3. Primary Dressing Applied Collegen AG 4. Secondary Dressing Dry Gauze Roll Gauze Notes JUXTALITE Electronic Signature(s) Signed: 04/26/2020 5:53:32 PM By: [name] RN Signed: 04/26/2020 6:14:29 PM By: [name] RN, BSN Entered By: [name] on 04/26/2020 13:38:49 -------------------------------------------------------------------------------- [place] Details Patient Name: Date of Service: [name], [place] [name] 04/26/2020 1:00 PM Medical Record Number: [id number] Patient Account Number: [id number] Date of Birth/Sex: Treating RN: 1950/08/13 (69 y.o. [name] Primary Care Tomasz Steeves: [name] Other Clinician: Referring Josejulian Tarango: Treating Lyman Balingit/Extender: Christella Hartigan in Treatment: 11 Vital Signs Time Taken: 13:17 Temperature (F): 98.3 Height (in): 67 Pulse (bpm): 80 Weight (lbs): 202 Respiratory Rate (breaths/min): 18 Body Mass Index (BMI): 31.6 Blood Pressure (mmHg): 100/63 Capillary Blood Glucose (mg/dl): 223 Reference Range: 80 - 120 mg / dl Electronic Signature(s) Signed: 04/27/2020 1:11:06 PM By: Sandre Kitty Entered By: Sandre Kitty on 04/26/2020 13:18:07

## 2020-04-29 DIAGNOSIS — N186 End stage renal disease: Secondary | ICD-10-CM | POA: Diagnosis not present

## 2020-04-29 DIAGNOSIS — N2581 Secondary hyperparathyroidism of renal origin: Secondary | ICD-10-CM | POA: Diagnosis not present

## 2020-04-29 DIAGNOSIS — D689 Coagulation defect, unspecified: Secondary | ICD-10-CM | POA: Diagnosis not present

## 2020-04-29 DIAGNOSIS — R300 Dysuria: Secondary | ICD-10-CM | POA: Diagnosis not present

## 2020-04-29 DIAGNOSIS — E1129 Type 2 diabetes mellitus with other diabetic kidney complication: Secondary | ICD-10-CM | POA: Diagnosis not present

## 2020-04-29 DIAGNOSIS — Z23 Encounter for immunization: Secondary | ICD-10-CM | POA: Diagnosis not present

## 2020-04-29 DIAGNOSIS — Z992 Dependence on renal dialysis: Secondary | ICD-10-CM | POA: Diagnosis not present

## 2020-04-29 DIAGNOSIS — D631 Anemia in chronic kidney disease: Secondary | ICD-10-CM | POA: Diagnosis not present

## 2020-05-02 DIAGNOSIS — Z23 Encounter for immunization: Secondary | ICD-10-CM | POA: Diagnosis not present

## 2020-05-02 DIAGNOSIS — N2581 Secondary hyperparathyroidism of renal origin: Secondary | ICD-10-CM | POA: Diagnosis not present

## 2020-05-02 DIAGNOSIS — E1129 Type 2 diabetes mellitus with other diabetic kidney complication: Secondary | ICD-10-CM | POA: Diagnosis not present

## 2020-05-02 DIAGNOSIS — R300 Dysuria: Secondary | ICD-10-CM | POA: Diagnosis not present

## 2020-05-02 DIAGNOSIS — N186 End stage renal disease: Secondary | ICD-10-CM | POA: Diagnosis not present

## 2020-05-02 DIAGNOSIS — Z992 Dependence on renal dialysis: Secondary | ICD-10-CM | POA: Diagnosis not present

## 2020-05-02 DIAGNOSIS — D631 Anemia in chronic kidney disease: Secondary | ICD-10-CM | POA: Diagnosis not present

## 2020-05-02 DIAGNOSIS — D689 Coagulation defect, unspecified: Secondary | ICD-10-CM | POA: Diagnosis not present

## 2020-05-03 ENCOUNTER — Encounter (HOSPITAL_BASED_OUTPATIENT_CLINIC_OR_DEPARTMENT_OTHER): Payer: Medicare Other | Admitting: Physician Assistant

## 2020-05-03 ENCOUNTER — Other Ambulatory Visit: Payer: Self-pay

## 2020-05-03 DIAGNOSIS — L97512 Non-pressure chronic ulcer of other part of right foot with fat layer exposed: Secondary | ICD-10-CM | POA: Diagnosis not present

## 2020-05-03 DIAGNOSIS — E1122 Type 2 diabetes mellitus with diabetic chronic kidney disease: Secondary | ICD-10-CM | POA: Diagnosis not present

## 2020-05-03 DIAGNOSIS — I120 Hypertensive chronic kidney disease with stage 5 chronic kidney disease or end stage renal disease: Secondary | ICD-10-CM | POA: Diagnosis not present

## 2020-05-03 DIAGNOSIS — L97312 Non-pressure chronic ulcer of right ankle with fat layer exposed: Secondary | ICD-10-CM | POA: Diagnosis not present

## 2020-05-03 DIAGNOSIS — I872 Venous insufficiency (chronic) (peripheral): Secondary | ICD-10-CM | POA: Diagnosis not present

## 2020-05-03 DIAGNOSIS — L97812 Non-pressure chronic ulcer of other part of right lower leg with fat layer exposed: Secondary | ICD-10-CM | POA: Diagnosis not present

## 2020-05-03 DIAGNOSIS — Z992 Dependence on renal dialysis: Secondary | ICD-10-CM | POA: Diagnosis not present

## 2020-05-03 DIAGNOSIS — L97822 Non-pressure chronic ulcer of other part of left lower leg with fat layer exposed: Secondary | ICD-10-CM | POA: Diagnosis not present

## 2020-05-03 DIAGNOSIS — N186 End stage renal disease: Secondary | ICD-10-CM | POA: Diagnosis not present

## 2020-05-03 DIAGNOSIS — E11621 Type 2 diabetes mellitus with foot ulcer: Secondary | ICD-10-CM | POA: Diagnosis not present

## 2020-05-03 NOTE — Progress Notes (Signed)
[name], [name] ([id number]) Visit Report for 05/03/2020 Arrival Information Details Patient Name: Date of Service: [name], [name] 05/03/2020 9:15 A M Medical Record Number: [id number] Patient Account Number: [id number] Date of Birth/Sex: Treating RN: 07-18-1951 (69 y.o. [name], [name] Primary Care Matther Labell: [name] Other Clinician: Referring Timohty Renbarger: Treating Freedom Peddy/Extender: Christella Hartigan in Treatment: 12 Visit Information History Since Last Visit Added or deleted any medications: No Patient Arrived: Wheel Chair Any new allergies or adverse reactions: No Arrival Time: 09:23 Had a fall or experienced change in No Accompanied By: self activities of daily living that may affect Transfer Assistance: None risk of falls: Patient Identification Verified: Yes Signs or symptoms of abuse/neglect since last visito No Secondary Verification Process Completed: Yes Hospitalized since last visit: No Patient Requires Transmission-Based Precautions: No Implantable device outside of the clinic excluding No Patient Has Alerts: Yes cellular tissue based products placed in the center Patient Alerts: Right ABI: [place] since last visit: Left ABI: [place] Has Dressing in Place as Prescribed: Yes Has Compression in Place as Prescribed: Yes Pain Present Now: No Electronic Signature(s) Signed: 05/03/2020 12:31:04 PM By: [name] Entered By: [name] on 05/03/2020 09:35:34 -------------------------------------------------------------------------------- Clinic Level of Care Assessment Details Patient Name: Date of Service: [facility] [name], [name] 05/03/2020 9:15 A M Medical Record Number: [id number] Patient Account Number: [id number] Date of Birth/Sex: Treating RN: Mar 10, 1951 (69 y.o. [name], [place]eding Primary Care [name]: [name] Other Clinician: Referring Breckan [name]: Treating [name]/Extender: Christella Hartigan in Treatment:  12 Clinic Level of Care Assessment Items TOOL 4 Quantity Score X- 1 0 Use when only an EandM is performed on FOLLOW-UP visit ASSESSMENTS - Nursing Assessment / Reassessment X- 1 10 Reassessment of Co-morbidities (includes updates in patient status) X- 1 5 Reassessment of Adherence to Treatment Plan ASSESSMENTS - Wound and Skin A ssessment / Reassessment []  - 0 Simple Wound Assessment / Reassessment - one wound X- 2 5 Complex Wound Assessment / Reassessment - multiple wounds X- 1 10 Dermatologic / Skin Assessment (not related to wound area) ASSESSMENTS - Focused Assessment []  - 0 Circumferential Edema Measurements - multi extremities X- 1 10 Nutritional Assessment / Counseling / Intervention []  - 0 Lower Extremity Assessment (monofilament, tuning fork, pulses) []  - 0 Peripheral Arterial Disease Assessment (using hand held doppler) ASSESSMENTS - Ostomy and/or Continence Assessment and Care []  - 0 Incontinence Assessment and Management []  - 0 Ostomy Care Assessment and Management (repouching, etc.) PROCESS - Coordination of Care []  - 0 Simple Patient / Family Education for ongoing care X- 1 20 Complex (extensive) Patient / Family Education for ongoing care X- 1 10 Staff obtains Programmer, systems, Records, T Results / Process Orders est []  - 0 Staff telephones HHA, Nursing Homes / Clarify orders / etc []  - 0 Routine Transfer to another Facility (non-emergent condition) []  - 0 Routine Hospital Admission (non-emergent condition) []  - 0 New Admissions / Biomedical engineer / Ordering NPWT Apligraf, etc. , []  - 0 Emergency Hospital Admission (emergent condition) []  - 0 Simple Discharge Coordination X- 1 15 Complex (extensive) Discharge Coordination PROCESS - Special Needs []  - 0 Pediatric / Minor Patient Management []  - 0 Isolation Patient Management []  - 0 Hearing / Language / Visual special needs []  - 0 Assessment of Community assistance (transportation, D/C  planning, etc.) []  - 0 Additional assistance / Altered mentation []  - 0 Support Surface(s) Assessment (bed, cushion, seat, etc.) INTERVENTIONS - Wound Cleansing / Measurement []  -  0 Simple Wound Cleansing - one wound X- 2 5 Complex Wound Cleansing - multiple wounds []  - 0 Wound Imaging (photographs - any number of wounds) []  - 0 Wound Tracing (instead of photographs) []  - 0 Simple Wound Measurement - one wound X- 2 5 Complex Wound Measurement - multiple wounds INTERVENTIONS - Wound Dressings X - Small Wound Dressing one or multiple wounds 2 10 []  - 0 Medium Wound Dressing one or multiple wounds []  - 0 Large Wound Dressing one or multiple wounds []  - 0 Application of Medications - topical []  - 0 Application of Medications - injection INTERVENTIONS - Miscellaneous []  - 0 External ear exam []  - 0 Specimen Collection (cultures, biopsies, blood, body fluids, etc.) []  - 0 Specimen(s) / Culture(s) sent or taken to Lab for analysis []  - 0 Patient Transfer (multiple staff / Civil Service fast streamer / Similar devices) []  - 0 Simple Staple / Suture removal (25 or less) []  - 0 Complex Staple / Suture removal (26 or more) []  - 0 Hypo / Hyperglycemic Management (close monitor of Blood Glucose) []  - 0 Ankle / Brachial Index (ABI) - do not check if billed separately X- 1 5 Vital Signs Has the patient been seen at the hospital within the last three years: Yes Total Score: 135 Level Of Care: New/Established - Level 4 Electronic Signature(s) Signed: 05/03/2020 12:31:04 PM By: [name] Entered By: [name] on 05/03/2020 09:47:18 -------------------------------------------------------------------------------- Encounter Discharge Information Details Patient Name: Date of Service: JO [name], [place] [name] 05/03/2020 9:15 A M Medical Record Number: [id number] Patient Account Number: [id number] Date of Birth/Sex: Treating RN: 01/17/1951 (69 y.o. [name] Primary Care Jase Himmelberger: [name] Other Clinician: Referring [name]: Treating Kaleen Rochette/Extender: Christella Hartigan in Treatment: 12 Encounter Discharge Information Items Discharge Condition: Stable Ambulatory Status: Wheelchair Discharge Destination: Home Transportation: Private Auto Accompanied By: self Schedule Follow-up Appointment: Yes Clinical Summary of Care: Electronic Signature(s) Signed: 05/03/2020 12:31:04 PM By: [name] Entered By: [name] on 05/03/2020 09:45:09 -------------------------------------------------------------------------------- Patient/Caregiver Education Details Patient Name: Date of Service: [facility] [name], [place] [name] 10/13/2021andnbsp9:15 A M Medical Record Number: [id number] Patient Account Number: [id number] Date of Birth/Gender: Treating RN: 1950/09/02 (69 y.o. [name] Primary Care Physician: [name] Other Clinician: Referring Physician: Treating Physician/Extender: Christella Hartigan in Treatment: 12 Education Assessment Education Provided To: Patient Education Topics Provided Venous: Handouts: Controlling Swelling with Compression Stockings Methods: Explain/Verbal Responses: Reinforcements needed Electronic Signature(s) Signed: 05/03/2020 12:31:04 PM By: [name] Entered By: [name] on 05/03/2020 09:44:22 -------------------------------------------------------------------------------- Wound Assessment Details Patient Name: Date of Service: [facility] [name], [place] [name] 05/03/2020 9:15 A M Medical Record Number: [id number] Patient Account Number: [id number] Date of Birth/Sex: Treating RN: 1951-02-05 (69 y.o. [name], Meta.Reding Primary Care Kaison Mcparland: [name] Other Clinician: Referring Shadaya Marschner: Treating Laurance Heide/Extender: Christella Hartigan in Treatment: 12 Wound Status Wound Number: 15 Primary Etiology: Diabetic Wound/Ulcer of the Lower Extremity Wound Location: Right,  Anterior Lower Leg Wound Status: Open Wounding Event: Gradually Appeared Date Acquired: 04/26/2020 Weeks Of Treatment: 1 Clustered Wound: No Wound Measurements Length: (cm) 0.5 Width: (cm) 0.5 Depth: (cm) 0.1 Area: (cm) 0.196 Volume: (cm) 0.02 % Reduction in Area: 70.3% % Reduction in Volume: 69.7% Wound Description Classification: Grade 2 Treatment Notes Wound #15 (Right, Anterior Lower Leg) 1. Cleanse With Wound Cleanser 3. Primary Dressing Applied Collegen [employer] 4. Secondary Dressing Dry Gauze 5. Secured With [employer]) Signed: 05/03/2020 12:31:04 PM  By: [name] Entered By: [name] on 05/03/2020 09:41:59 -------------------------------------------------------------------------------- Wound Assessment Details Patient Name: Date of Service: [facility] [name], [name] 05/03/2020 9:15 A M Medical Record Number: [id number] Patient Account Number: [id number] Date of Birth/Sex: Treating RN: 1950-11-01 (69 y.o. [name], Meta.Reding Primary Care Valma Rotenberg: [name] Other Clinician: Referring Harlei Lehrmann: Treating Coady Train/Extender: Christella Hartigan in Treatment: 12 Wound Status Wound Number: 8 Primary Etiology: Diabetic Wound/Ulcer of the Lower Extremity Wound Location: Left T Third oe Wound Status: Open Wounding Event: Blister Date Acquired: 02/23/2020 Weeks Of Treatment: 10 Clustered Wound: No Wound Measurements Length: (cm) 0.5 Width: (cm) 0.7 Depth: (cm) 0.2 Area: (cm) 0.275 Volume: (cm) 0.055 % Reduction in Area: -95% % Reduction in Volume: -292.9% Wound Description Classification: Grade 2 Treatment Notes Wound #8 (Left Toe Third) 1. Cleanse With Wound Cleanser 3. Primary Dressing Applied Collegen AG 4. Secondary Dressing Dry Gauze 5. Secured With [employer]) Signed: 05/03/2020 12:31:04 PM By: [name] Entered By: [name] on 05/03/2020  09:36:12 -------------------------------------------------------------------------------- Vitals Details Patient Name: Date of Service: JO [name], [place] [name] 05/03/2020 9:15 A M Medical Record Number: [id number] Patient Account Number: [id number] Date of Birth/Sex: Treating RN: [birth year] (69 y.o. [name], [name] Primary Care Hakeen Shipes: [name] Other Clinician: Referring Maleeka Sabatino: Treating Bryli Mantey/Extender: Christella Hartigan in Treatment: 12 Vital Signs Time Taken: 09:25 Temperature (F): 98.8 Height (in): 67 Pulse (bpm): 83 Weight (lbs): 202 Respiratory Rate (breaths/min): 20 Body Mass Index (BMI): 31.6 Blood Pressure (mmHg): 106/63 Capillary Blood Glucose (mg/dl): 160 Reference Range: 80 - 120 mg / dl Electronic Signature(s) Signed: 05/03/2020 12:31:04 PM By: [name] Entered By: [name] on 05/03/2020 09:35:59

## 2020-05-06 DIAGNOSIS — Z23 Encounter for immunization: Secondary | ICD-10-CM | POA: Diagnosis not present

## 2020-05-06 DIAGNOSIS — R300 Dysuria: Secondary | ICD-10-CM | POA: Diagnosis not present

## 2020-05-06 DIAGNOSIS — E1129 Type 2 diabetes mellitus with other diabetic kidney complication: Secondary | ICD-10-CM | POA: Diagnosis not present

## 2020-05-06 DIAGNOSIS — D631 Anemia in chronic kidney disease: Secondary | ICD-10-CM | POA: Diagnosis not present

## 2020-05-06 DIAGNOSIS — D689 Coagulation defect, unspecified: Secondary | ICD-10-CM | POA: Diagnosis not present

## 2020-05-06 DIAGNOSIS — N2581 Secondary hyperparathyroidism of renal origin: Secondary | ICD-10-CM | POA: Diagnosis not present

## 2020-05-06 DIAGNOSIS — N186 End stage renal disease: Secondary | ICD-10-CM | POA: Diagnosis not present

## 2020-05-06 DIAGNOSIS — Z992 Dependence on renal dialysis: Secondary | ICD-10-CM | POA: Diagnosis not present

## 2020-05-07 DIAGNOSIS — R404 Transient alteration of awareness: Secondary | ICD-10-CM | POA: Diagnosis not present

## 2020-05-07 DIAGNOSIS — R6889 Other general symptoms and signs: Secondary | ICD-10-CM | POA: Diagnosis not present

## 2020-05-07 DIAGNOSIS — Z743 Need for continuous supervision: Secondary | ICD-10-CM | POA: Diagnosis not present

## 2020-05-08 ENCOUNTER — Other Ambulatory Visit: Payer: Self-pay | Admitting: Family Medicine

## 2020-05-09 DIAGNOSIS — N2581 Secondary hyperparathyroidism of renal origin: Secondary | ICD-10-CM | POA: Diagnosis not present

## 2020-05-09 DIAGNOSIS — Z23 Encounter for immunization: Secondary | ICD-10-CM | POA: Diagnosis not present

## 2020-05-09 DIAGNOSIS — N186 End stage renal disease: Secondary | ICD-10-CM | POA: Diagnosis not present

## 2020-05-09 DIAGNOSIS — Z992 Dependence on renal dialysis: Secondary | ICD-10-CM | POA: Diagnosis not present

## 2020-05-09 DIAGNOSIS — D689 Coagulation defect, unspecified: Secondary | ICD-10-CM | POA: Diagnosis not present

## 2020-05-09 DIAGNOSIS — R300 Dysuria: Secondary | ICD-10-CM | POA: Diagnosis not present

## 2020-05-09 DIAGNOSIS — E1129 Type 2 diabetes mellitus with other diabetic kidney complication: Secondary | ICD-10-CM | POA: Diagnosis not present

## 2020-05-09 DIAGNOSIS — D631 Anemia in chronic kidney disease: Secondary | ICD-10-CM | POA: Diagnosis not present

## 2020-05-10 ENCOUNTER — Encounter (HOSPITAL_BASED_OUTPATIENT_CLINIC_OR_DEPARTMENT_OTHER): Payer: Medicare Other | Admitting: Physician Assistant

## 2020-05-10 ENCOUNTER — Other Ambulatory Visit: Payer: Self-pay

## 2020-05-10 DIAGNOSIS — Z992 Dependence on renal dialysis: Secondary | ICD-10-CM | POA: Diagnosis not present

## 2020-05-10 DIAGNOSIS — E1122 Type 2 diabetes mellitus with diabetic chronic kidney disease: Secondary | ICD-10-CM | POA: Diagnosis not present

## 2020-05-10 DIAGNOSIS — L97312 Non-pressure chronic ulcer of right ankle with fat layer exposed: Secondary | ICD-10-CM | POA: Diagnosis not present

## 2020-05-10 DIAGNOSIS — L97512 Non-pressure chronic ulcer of other part of right foot with fat layer exposed: Secondary | ICD-10-CM | POA: Diagnosis not present

## 2020-05-10 DIAGNOSIS — L97822 Non-pressure chronic ulcer of other part of left lower leg with fat layer exposed: Secondary | ICD-10-CM | POA: Diagnosis not present

## 2020-05-10 DIAGNOSIS — L97812 Non-pressure chronic ulcer of other part of right lower leg with fat layer exposed: Secondary | ICD-10-CM | POA: Diagnosis not present

## 2020-05-10 DIAGNOSIS — I120 Hypertensive chronic kidney disease with stage 5 chronic kidney disease or end stage renal disease: Secondary | ICD-10-CM | POA: Diagnosis not present

## 2020-05-10 DIAGNOSIS — I872 Venous insufficiency (chronic) (peripheral): Secondary | ICD-10-CM | POA: Diagnosis not present

## 2020-05-10 DIAGNOSIS — E11621 Type 2 diabetes mellitus with foot ulcer: Secondary | ICD-10-CM | POA: Diagnosis not present

## 2020-05-10 DIAGNOSIS — N186 End stage renal disease: Secondary | ICD-10-CM | POA: Diagnosis not present

## 2020-05-10 NOTE — Progress Notes (Addendum)
[name] ([id number]) Visit Report for 05/10/2020 Chief Complaint Document Details Patient Name: Date of Service: [name], [name] [name] K. 05/10/2020 12:45 PM Medical Record Number: [id number] Patient Account Number: [id number] Date of Birth/Sex: Treating RN: Jun 24, 1951 (69 y.o. [name] [name] Other Clinician: Referring Provider: Treating Provider/Extender: [name] in Treatment: 13 Information Obtained from: Patient Chief Complaint Multiple LE Ulcers Electronic Signature(s) Signed: 05/10/2020 1:08:08 PM By: [name] Keeler PA-C Entered By: [name] on 05/10/2020 13:08:07 -------------------------------------------------------------------------------- HPI Details Patient Name: Date of Service: [name], [name] [name] K. 05/10/2020 12:45 PM Medical Record Number: [id number] Patient Account Number: [id number] Date of Birth/Sex: Treating RN: 1950/11/28 (69 y.o. [name] [name] Other Clinician: Referring Provider: Treating Provider/Extender: [name] in Treatment: 13 History of Present Illness HPI Description: Evaluate7/21/2021 today patient presents for evaluation of ulcerations on her legs which she tells me tend to come and go as far as small blistering locations and sometimes are better than other times. Right now she tells me that this is actually doing a little bit better but nonetheless will not completely go away. She has ulcerations bilaterally on her lower extremities. The right is worse than the left. 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 The patient has no evidence of active infection at this time. She however appears to have proficient blood flow into the extremities and I think would tolerate a 3 layer compression wrap  she was noncompressible on arterial studies. Nonetheless there was no obvious signs of occlusion. 02/16/2020 on evaluation today patient appears to be doing much better in regard to her wounds in general. On the past week she has made significant improvements which is great news there is no signs of active infection at this time. No fevers, chills, nausea, vomiting, or diarrhea. 02/23/2020 on evaluation today patient appears to be doing well in regard to her lower extremity ulcers and ankle ulcers. Overall I feel like she is making great progress and in general I am extremely pleased with how things stand. There is no sign of active infection at this time which is also good news. She will require slight debridement around the right ankle region. 03/02/20-Patient returns at 1 week, has few new areas open on the left leg, especially 1 healed area on the medial ankle that has slight small open area now, to anterior leg areas that started out as blisters that are open now. She was using juxta lite compression to the left, she used Band-Aid to cover the vesicle that had ruptured on the left anterior leg, we are using 3 layer compression on the right. We are using silver alginate to the toe wound on the left. 03/08/2020 upon evaluation today patient unfortunately is doing much worse in regard to her right leg although her left leg looks like is doing better. Again we have taken her compression wraps off last week since she was doing well and transitioned her to her Velcro compression wrap. However apparently the patient is stating this was not put on here in the clinic and she never put it on at home. With that being said unfortunately she developed swelling and opened up new wounds which are present today she has quite a few of them in fact. 03/15/2020 on evaluation today patient appears to be doing well with  regard to her wounds on the lower extremities bilaterally. Fortunately there is no signs of active  infection at this time. No fever chills noted. Overall I am actually very pleased with where things stand currently. 03/22/2020 on evaluation today patient appears to be doing fairly well in regard to her wounds in general. She just has really one area left on her toe everything else is healed on her legs. She does have her Velcro compression wraps which I think are appropriate at this point. Fortunately there is no signs of active infection at this time. No fevers, chills, nausea, vomiting, or diarrhea. 03/29/2020 on evaluation today patient appears to be doing poorly in regard to her right leg which is pretty much completely reopened compared to last week. She states it was okay until Saturday when she woke up she had blisters. With that being said it appears that this is likely stemming from the fact that the patient is apparently though I just found this out today going to sleep in her bed with her legs up on the bed with her but throughout the night she tends to end up with her legs hanging off the side or bottom of the bed on the floor. With that being said I think that this likely is happening much too long and then she has significant swelling the builds up as she sleeps. Obviously I think this is the reasoning behind what we are seeing currently. 04/05/2020 on evaluation today patient appears to be doing quite well with regard to her leg ulcers at this point. Fortunately there is no signs of anything significantly open again which is great news nonetheless I am to continue wrapping her for at least a couple of weeks due to the fact that she seems to continually reopen as soon as we unwrapped her. 04/12/2020 upon evaluation today patient appears to be doing excellent in regard to her leg ulcers all appear to be improving which is great news. She is also doing better in regard to the toe the infection seems to be under better control and the Santyl is helping to clean this up. Overall very pleased much  more so than what I saw last week. 04/19/2020 on evaluation today patient appears to be doing well with regard to her leg ulcers which are showing no signs of opening at this point. Her toe is also showing signs of improvement she is about done with her antibiotic. Overall I feel like things are doing quite well. 04/26/2020 upon evaluation today patient appears to be doing unfortunately a little bit worse compared to previous. She has reopened wounds on the right anterior lower extremity. This is unfortunate as we were hoping that this would really maintain healing. That does not appear to be the case. I think with him the need to see about making a referral for venous studies and provider evaluation with [facility] imaging with the interventional radiologist. 08/11/2019 upon evaluation today patient appears to be doing well in general in regard to her legs. Her right leg she does have a new wound that opened since last week. On the left leg everything appears to be healed she does still have the opening on her toe which we are managing but again this does not appear to be any worse. She just needs to get this to feeling and cover over. There is no evidence of infection which is at least great news. Electronic Signature(s) Signed: 05/10/2020 1:49:54 PM By: [name] Keeler PA-C Entered By: Joaquim Lai  IIIMargarita Grizzle on 05/10/2020 13:49:53 -------------------------------------------------------------------------------- Physical Exam Details Patient Name: Date of Service: [name], [name] [name] K. 05/10/2020 12:45 PM Medical Record Number: [id number] Patient Account Number: [id number] Date of Birth/Sex: Treating RN: February 18, 1951 (69 y.o. [name] [name] Other Clinician: Referring Provider: Treating Provider/Extender: [name] in Treatment: 9 Constitutional Well-nourished and well-hydrated in no acute distress. Respiratory normal breathing without  difficulty. Psychiatric this patient is able to make decisions and demonstrates good insight into disease process. Alert and Oriented x 3. pleasant and cooperative. Notes Patient's wound beds currently showed signs of good granulation and epithelization in general there does not appear to be any signs of active infection I do believe we want to wrap her right leg today to try to get this to seal up we need to get her to the point that she will be able to go without having to have wraps performed by Ebony Hail just use her Velcro wraps to try to keep things under control here. Electronic Signature(s) Signed: 05/10/2020 1:50:15 PM By: [name] Keeler PA-C Entered By: [name] on 05/10/2020 13:50:15 -------------------------------------------------------------------------------- Physician Orders Details Patient Name: Date of Service: [facility] [name], [place] [name] K. 05/10/2020 12:45 PM Medical Record Number: [id number] Patient Account Number: [id number] Date of Birth/Sex: Treating RN: [birth year] (69 y.o. [name] [name] Other Clinician: Referring Provider: Treating Provider/Extender: [name] in Treatment: [phone] Verbal / Phone Orders: No Diagnosis Coding ICD-10 Coding Code Description I87.2 Venous insufficiency (chronic) (peripheral) E11.621 Type 2 diabetes mellitus with foot ulcer L97.822 Non-pressure chronic ulcer of other part of left lower leg with fat layer exposed L97.312 Non-pressure chronic ulcer of right ankle with fat layer exposed L97.812 Non-pressure chronic ulcer of other part of right lower leg with fat layer exposed L97.512 Non-pressure chronic ulcer of other part of right foot with fat layer exposed N18.6 End stage renal disease Z99.2 Dependence on renal dialysis I10 Essential ([name]) hypertension Follow-up Appointments Return Appointment in 1 week. Dressing Change Frequency Wound #16 Right,Medial Lower  Leg Do not change entire dressing for one week. Wound #8 Left T Third oe Change Dressing every other day. Skin Barriers/Peri-Wound Care Moisturizing lotion - to both legs daily Wound Cleansing Wound #16 Right,Medial Lower Leg May shower with protection. Wound #8 Left T Third oe May shower and wash wound with soap and water. [name] Wound Dressing Wound #16 Right,Medial Lower Leg Silver Collagen Wound #8 Left T Third oe Silver Collagen Secondary Dressing Wound #8 Left T Third oe Kerlix/Rolled Gauze Dry Gauze Edema Control 3 Layer Compression System - Right Lower Extremity - patient to remove compression wrap on right leg prior to venous studies on Tuesday Avoid standing for long periods of time Elevate legs to the level of the heart or above for 30 minutes daily and/or when sitting, a frequency of: - throughout the day Exercise regularly Support Garment 20-30 mm/Hg pressure to: - juxtalite compression garments left leg daily Electronic Signature(s) Signed: 05/10/2020 4:35:17 PM By: [name] Keeler PA-C Signed: 05/10/2020 4:55:29 PM By: [name] RN, BSN Entered By: [name] on 05/10/2020 13:47:50 -------------------------------------------------------------------------------- Problem List Details Patient Name: Date of Service: [facility] [name], [place] [name] K. 05/10/2020 12:45 PM Medical Record Number: [id number] Patient Account Number: [id number] Date of Birth/Sex: Treating RN: 08-03-1950 (69 y.o. [name] [name] Other Clinician: Referring Provider: Treating Provider/Extender: [name] in Treatment:  13 Active Problems ICD-10 Encounter Code Description Active Date MDM Diagnosis I87.2 Venous insufficiency (chronic) (peripheral) 02/09/2020 No Yes E11.621 Type 2 diabetes mellitus with foot ulcer 02/09/2020 No Yes L97.822 Non-pressure chronic ulcer of other part of left lower leg with fat layer  exposed7/21/2021 No Yes L97.312 Non-pressure chronic ulcer of right ankle with fat layer exposed 02/09/2020 No Yes L97.812 Non-pressure chronic ulcer of other part of right lower leg with fat layer 02/09/2020 No Yes exposed L97.512 Non-pressure chronic ulcer of other part of right foot with fat layer exposed 02/09/2020 No Yes N18.6 End stage renal disease 02/09/2020 No Yes Z99.2 Dependence on renal dialysis 02/09/2020 No Yes I10 Essential ([name]) hypertension 02/09/2020 No Yes Inactive Problems Resolved Problems Electronic Signature(s) Signed: 05/10/2020 1:07:57 PM By: [name] Keeler PA-C Entered By: [name] on 05/10/2020 13:07:57 -------------------------------------------------------------------------------- Progress Note Details Patient Name: Date of Service: [facility] [name], [place] [name] K. 05/10/2020 12:45 PM Medical Record Number: [id number] Patient Account Number: [id number] Date of Birth/Sex: Treating RN: 01/24/1951 (69 y.o. [name] [name] Other Clinician: Referring Provider: Treating Provider/Extender: [name] in Treatment: 13 Subjective Chief Complaint Information obtained from Patient Multiple LE Ulcers History of Present Illness (HPI) Evaluate7/21/2021 today patient presents for evaluation of ulcerations on her legs which she tells me tend to come and go as far as small blistering locations and sometimes are better than other times. Right now she tells me that this is actually doing a little bit better but nonetheless will not completely go away. She has ulcerations bilaterally on her lower extremities. The right is worse than the left. 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 The patient has no evidence of active infection at this time. She however appears to have proficient blood flow into  the extremities and I think would tolerate a 3 layer compression wrap she was noncompressible on arterial studies. Nonetheless there was no obvious signs of occlusion. 02/16/2020 on evaluation today patient appears to be doing much better in regard to her wounds in general. On the past week she has made significant improvements which is great news there is no signs of active infection at this time. No fevers, chills, nausea, vomiting, or diarrhea. 02/23/2020 on evaluation today patient appears to be doing well in regard to her lower extremity ulcers and ankle ulcers. Overall I feel like she is making great progress and in general I am extremely pleased with how things stand. There is no sign of active infection at this time which is also good news. She will require slight debridement around the right ankle region. 03/02/20-Patient returns at 1 week, has few new areas open on the left leg, especially 1 healed area on the medial ankle that has slight small open area now, to anterior leg areas that started out as blisters that are open now. She was using juxta lite compression to the left, she used Band-Aid to cover the vesicle that had ruptured on the left anterior leg, we are using 3 layer compression on the right. We are using silver alginate to the toe wound on the left. 03/08/2020 upon evaluation today patient unfortunately is doing much worse in regard to her right leg although her left leg looks like is doing better. Again we have taken her compression wraps off last week since she was doing well and transitioned her to her Velcro compression  wrap. However apparently the patient is stating this was not put on here in the clinic and she never put it on at home. With that being said unfortunately she developed swelling and opened up new wounds which are present today she has quite a few of them in fact. 03/15/2020 on evaluation today patient appears to be doing well with regard to her wounds on the lower  extremities bilaterally. Fortunately there is no signs of active infection at this time. No fever chills noted. Overall I am actually very pleased with where things stand currently. 03/22/2020 on evaluation today patient appears to be doing fairly well in regard to her wounds in general. She just has really one area left on her toe everything else is healed on her legs. She does have her Velcro compression wraps which I think are appropriate at this point. Fortunately there is no signs of active infection at this time. No fevers, chills, nausea, vomiting, or diarrhea. 03/29/2020 on evaluation today patient appears to be doing poorly in regard to her right leg which is pretty much completely reopened compared to last week. She states it was okay until Saturday when she woke up she had blisters. With that being said it appears that this is likely stemming from the fact that the patient is apparently though I just found this out today going to sleep in her bed with her legs up on the bed with her but throughout the night she tends to end up with her legs hanging off the side or bottom of the bed on the floor. With that being said I think that this likely is happening much too long and then she has significant swelling the builds up as she sleeps. Obviously I think this is the reasoning behind what we are seeing currently. 04/05/2020 on evaluation today patient appears to be doing quite well with regard to her leg ulcers at this point. Fortunately there is no signs of anything significantly open again which is great news nonetheless I am to continue wrapping her for at least a couple of weeks due to the fact that she seems to continually reopen as soon as we unwrapped her. 04/12/2020 upon evaluation today patient appears to be doing excellent in regard to her leg ulcers all appear to be improving which is great news. She is also doing better in regard to the toe the infection seems to be under better control and  the Santyl is helping to clean this up. Overall very pleased much more so than what I saw last week. 04/19/2020 on evaluation today patient appears to be doing well with regard to her leg ulcers which are showing no signs of opening at this point. Her toe is also showing signs of improvement she is about done with her antibiotic. Overall I feel like things are doing quite well. 04/26/2020 upon evaluation today patient appears to be doing unfortunately a little bit worse compared to previous. She has reopened wounds on the right anterior lower extremity. This is unfortunate as we were hoping that this would really maintain healing. That does not appear to be the case. I think with him the need to see about making a referral for venous studies and provider evaluation with [facility] imaging with the interventional radiologist. 08/11/2019 upon evaluation today patient appears to be doing well in general in regard to her legs. Her right leg she does have a new wound that opened since last week. On the left leg everything appears to be  healed she does still have the opening on her toe which we are managing but again this does not appear to be any worse. She just needs to get this to feeling and cover over. There is no evidence of infection which is at least great news. Objective Constitutional Well-nourished and well-hydrated in no acute distress. Vitals Time Taken: 1:13 PM, Height: 67 in, Weight: 202 lbs, BMI: 31.6, Temperature: 98.3 F, Pulse: 91 bpm, Respiratory Rate: 18 breaths/min, Blood Pressure: 100/66 mmHg, Capillary Blood Glucose: 89 mg/dl. General Notes: glucose per pt report Respiratory normal breathing without difficulty. Psychiatric this patient is able to make decisions and demonstrates good insight into disease process. Alert and Oriented x 3. pleasant and cooperative. General Notes: Patient's wound beds currently showed signs of good granulation and epithelization in general there does  not appear to be any signs of active infection I do believe we want to wrap her right leg today to try to get this to seal up we need to get her to the point that she will be able to go without having to have wraps performed by Ebony Hail just use her Velcro wraps to try to keep things under control here. Integumentary (Hair, Skin) Wound #15 status is Open. Original cause of wound was Gradually Appeared. The wound is located on the Right,Anterior Lower Leg. The wound measures 0cm length x 0cm width x 0cm depth; 0cm^2 area and 0cm^3 volume. There is no tunneling or undermining noted. There is a none present amount of drainage noted. There is no granulation within the wound bed. There is no necrotic tissue within the wound bed. Wound #16 status is Open. Original cause of wound was Gradually Appeared. The wound is located on the Right,Medial Lower Leg. The wound measures 0.9cm length x 0.8cm width x 0.1cm depth; 0.565cm^2 area and 0.057cm^3 volume. There is Fat Layer (Subcutaneous Tissue) exposed. There is no tunneling or undermining noted. There is a medium amount of serosanguineous drainage noted. The wound margin is flat and intact. There is large (67-100%) pink granulation within the wound bed. There is a small (1-33%) amount of necrotic tissue within the wound bed including Adherent Slough. Wound #8 status is Open. Original cause of wound was Blister. The wound is located on the Left T Third. The wound measures 0.7cm length x 1.1cm width x oe 0.1cm depth; 0.605cm^2 area and 0.06cm^3 volume. There is Fat Layer (Subcutaneous Tissue) exposed. There is no tunneling or undermining noted. There is a medium amount of serosanguineous drainage noted. The wound margin is flat and intact. There is small (1-33%) pink granulation within the wound bed. There is a large (67-100%) amount of necrotic tissue within the wound bed including Adherent Slough. 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 There was a Three Layer Compression Therapy Procedure by Kela Millin, RN. Post procedure Diagnosis Wound #16: Same as Pre-Procedure Plan Follow-up Appointments: Return Appointment in 1 week. Dressing Change Frequency: Wound #16 Right,Medial Lower Leg: Do not change  entire dressing for one week. Wound #8 Left T Third: oe Change Dressing every other day. Skin Barriers/Peri-Wound Care: Moisturizing lotion - to both legs daily Wound Cleansing: Wound #16 Right,Medial Lower Leg: May shower with protection. Wound #8 Left T Third: oe May shower and wash wound with soap and water. [name] Wound Dressing: Wound #16 Right,Medial Lower Leg: Silver Collagen Wound #8 Left T Third: oe Silver Collagen Secondary Dressing: Wound #8 Left T Third: oe Kerlix/Rolled Gauze Dry Gauze Edema Control: 3 Layer Compression System - Right Lower Extremity - patient to remove compression wrap on right leg prior to venous studies on Tuesday Avoid standing for long periods of time Elevate legs to the level of the heart or above for 30 minutes daily and/or when sitting, a frequency of: - throughout the day Exercise regularly Support Garment 20-30 mm/Hg pressure to: - juxtalite compression garments left leg daily 1. I would recommend currently that we continue with the wound care measures as before and the patient is in agreement  with the plan we are using collagen to the toe as well as the right leg. 2. I would recommend that we can continue with the Velcro wrap on the left leg. 3. I am going to recommend that we go ahead and reinitiate a 3 layer compression wrap to the right leg. We will see patient back for reevaluation in 1 week here in the clinic. If anything worsens or changes patient will contact our office for additional recommendations. Electronic Signature(s) Signed: 05/10/2020 1:50:49 PM By: [name] Keeler PA-C Entered By: [name] on 05/10/2020 13:50:49 -------------------------------------------------------------------------------- SuperBill Details Patient Name: Date of Service: [facility] [name], [place] [name] K. 05/10/2020 Medical Record Number: [id number] Patient Account Number: [id number] Date of Birth/Sex: Treating RN: Apr 19, 1951 (69 y.o. 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

## 2020-05-11 DIAGNOSIS — N2581 Secondary hyperparathyroidism of renal origin: Secondary | ICD-10-CM | POA: Diagnosis not present

## 2020-05-11 DIAGNOSIS — R300 Dysuria: Secondary | ICD-10-CM | POA: Diagnosis not present

## 2020-05-11 DIAGNOSIS — D631 Anemia in chronic kidney disease: Secondary | ICD-10-CM | POA: Diagnosis not present

## 2020-05-11 DIAGNOSIS — N186 End stage renal disease: Secondary | ICD-10-CM | POA: Diagnosis not present

## 2020-05-11 DIAGNOSIS — E1129 Type 2 diabetes mellitus with other diabetic kidney complication: Secondary | ICD-10-CM | POA: Diagnosis not present

## 2020-05-11 DIAGNOSIS — Z992 Dependence on renal dialysis: Secondary | ICD-10-CM | POA: Diagnosis not present

## 2020-05-11 DIAGNOSIS — Z23 Encounter for immunization: Secondary | ICD-10-CM | POA: Diagnosis not present

## 2020-05-11 DIAGNOSIS — D689 Coagulation defect, unspecified: Secondary | ICD-10-CM | POA: Diagnosis not present

## 2020-05-11 NOTE — Progress Notes (Signed)
[facility] ([id number]) Visit Report for 05/10/2020 Arrival Information Details Patient Name: Date of Service: [name] [name], [place] [name] 05/10/2020 12:45 PM Medical Record Number: [id number] Patient Account Number: [id number] Date of Birth/Sex: Treating RN: 08-Nov-1950 (69 y.o. [name] Primary Care Naketa Daddario: Lynnea Ferrier Other Clinician: Referring Hether Anselmo: Treating Gurley Climer/Extender: Karl Luke in Treatment: 13 Visit Information History Since Last Visit Added or deleted any medications: No Patient Arrived: Wheel Chair Any new allergies or adverse reactions: No Arrival Time: 13:12 Had a fall or experienced change in No Accompanied By: alone activities of daily living that may affect Transfer Assistance: None risk of falls: Patient Identification Verified: Yes Signs or symptoms of abuse/neglect since last visito No Secondary Verification Process Completed: Yes Hospitalized since last visit: No Patient Requires Transmission-Based Precautions: No Implantable device outside of the clinic excluding No Patient Has Alerts: Yes cellular tissue based products placed in the center Patient Alerts: Right ABI: [place] since last visit: Left ABI: [place] Has Dressing in Place as Prescribed: Yes Has Compression in Place as Prescribed: Yes Pain Present Now: No Electronic Signature(s) Signed: 05/11/2020 5:24:31 PM By: Zandra Abts RN, BSN Entered By: Zandra Abts on 05/10/2020 13:13:28 -------------------------------------------------------------------------------- Compression Therapy Details Patient Name: Date of Service: [name], [name] 05/10/2020 12:45 PM Medical Record Number: [id number] Patient Account Number: [id number] Date of Birth/Sex: Treating RN: 1950/08/19 (69 y.o. [name] Primary Care Kais Monje: Lynnea Ferrier Other Clinician: Referring Keaton Beichner: Treating Jerlyn Pain/Extender: Karl Luke in Treatment:  13 Compression Therapy Performed for Wound Assessment: Wound #16 Right,Medial Lower Leg Performed By: Clinician Cherylin Mylar, RN Compression Type: Three Layer Post Procedure Diagnosis Same as Pre-procedure Electronic Signature(s) Signed: 05/10/2020 4:55:29 PM By: Zenaida Deed RN, BSN Entered By: Zenaida Deed on 05/10/2020 13:44:46 -------------------------------------------------------------------------------- Encounter Discharge Information Details Patient Name: Date of Service: [name], [name] 05/10/2020 12:45 PM Medical Record Number: [id number] Patient Account Number: [id number] Date of Birth/Sex: Treating RN: May 31, 1951 (69 y.o. [name] Primary Care [name]: Lynnea Ferrier Other Clinician: Referring Andreya Lacks: Treating [name]/Extender: Karl Luke in Treatment: 13 Encounter Discharge Information Items Discharge Condition: Stable Ambulatory Status: Wheelchair Discharge Destination: Home Transportation: Private Auto Accompanied By: self Schedule Follow-up Appointment: Yes Clinical Summary of Care: Electronic Signature(s) Signed: 05/10/2020 4:36:52 PM By: Shawn Stall Entered By: Shawn Stall on 05/10/2020 14:02:48 -------------------------------------------------------------------------------- Lower Extremity Assessment Details Patient Name: Date of Service: [name], [place] [name] 05/10/2020 12:45 PM Medical Record Number: [id number] Patient Account Number: [id number] Date of Birth/Sex: Treating RN: 1951/01/03 (69 y.o. [name] Primary Care Danicia Terhaar: Lynnea Ferrier Other Clinician: Referring Wesleigh Markovic: Treating Lamya Lausch/Extender: Karl Luke in Treatment: 13 Edema Assessment Assessed: [Left: No] Franne Forts: No] Edema: [Left: No] [Right: No] Calf Left: Right: Point of Measurement: 38 cm From Medial Instep 34 cm 33 cm Ankle Left: Right: Point of Measurement: 13 cm From Medial Instep  22.5 cm 23 cm Vascular Assessment Pulses: Dorsalis Pedis Palpable: [Left:Yes] [Right:Yes] Electronic Signature(s) Signed: 05/11/2020 5:24:31 PM By: Zandra Abts RN, BSN Entered By: Zandra Abts on 05/10/2020 13:23:58 -------------------------------------------------------------------------------- Multi-Disciplinary Care Plan Details Patient Name: Date of Service: [name], [name] 05/10/2020 12:45 PM Medical Record Number: [id number] Patient Account Number: [id number] Date of Birth/Sex: Treating RN: 17-Dec-1950 (69 y.o. [name] Primary Care Klye Besecker: Lynnea Ferrier Other Clinician: Referring Zacharius Funari: Treating Ettel Albergo/Extender: Karl Luke in Treatment: 13 Active Inactive Venous Leg Ulcer Nursing Diagnoses:  Knowledge deficit related to disease process and management Potential for venous Insuffiency (use before diagnosis confirmed) Goals: Patient will maintain optimal edema control Date Initiated: 02/09/2020 Target Resolution Date: 06/07/2020 Goal Status: Active Patient/caregiver will verbalize understanding of disease process and disease management Date Initiated: 02/09/2020 Date Inactivated: 03/02/2020 Target Resolution Date: 03/08/2020 Goal Status: Met Interventions: Assess peripheral edema status every visit. Compression as ordered Provide education on venous insufficiency Treatment Activities: Therapeutic compression applied : 02/09/2020 Notes: Wound/Skin Impairment Nursing Diagnoses: Impaired tissue integrity Knowledge deficit related to ulceration/compromised skin integrity Goals: Patient/caregiver will verbalize understanding of skin care regimen Date Initiated: 02/09/2020 Target Resolution Date: 06/07/2020 Goal Status: Active Ulcer/skin breakdown will have a volume reduction of 30% by week 4 Date Initiated: 02/09/2020 Date Inactivated: 03/02/2020 Target Resolution Date: 03/08/2020 Goal Status: Met Interventions: Assess  patient/caregiver ability to obtain necessary supplies Assess patient/caregiver ability to perform ulcer/skin care regimen upon admission and as needed Assess ulceration(s) every visit Provide education on ulcer and skin care Treatment Activities: Skin care regimen initiated : 02/09/2020 Topical wound management initiated : 02/09/2020 Notes: Electronic Signature(s) Signed: 05/10/2020 4:55:29 PM By: [name] RN, BSN Entered By: [name] on 05/10/2020 13:26:25 -------------------------------------------------------------------------------- Pain Assessment Details Patient Name: Date of Service: [name], [place] [name] 05/10/2020 12:45 PM Medical Record Number: [id number] Patient Account Number: [id number] Date of Birth/Sex: Treating RN: 04-07-1951 (69 y.o. [name] Primary Care Ewen Varnell: [name] Other Clinician: Referring Telesforo Brosnahan: Treating Linc Renne/Extender: Christella Hartigan in Treatment: 13 Active Problems Location of Pain Severity and Description of Pain Patient Has Paino No Site Locations Pain Management and Medication Current Pain Management: Electronic Signature(s) Signed: 05/11/2020 5:24:31 PM By: Levan Hurst RN, BSN Entered By: Levan Hurst on 05/10/2020 13:15:28 -------------------------------------------------------------------------------- Patient/Caregiver Education Details Patient Name: Date of Service: [facility] [name], [place] [name] 10/20/2021andnbsp12:45 PM Medical Record Number: [id number] Patient Account Number: [id number] Date of Birth/Gender: Treating RN: Jul 12, 1951 (69 y.o. [name] Primary Care Physician: [name] Other Clinician: Referring Physician: Treating Physician/Extender: Christella Hartigan in Treatment: 13 Education Assessment Education Provided To: Patient Education Topics Provided Venous: Methods: Explain/Verbal Responses: Reinforcements needed, State content  correctly Wound/Skin Impairment: Methods: Explain/Verbal Responses: Reinforcements needed, State content correctly Electronic Signature(s) Signed: 05/10/2020 4:55:29 PM By: [name] RN, BSN Entered By: [name] on 05/10/2020 13:26:45 -------------------------------------------------------------------------------- Wound Assessment Details Patient Name: Date of Service: [name], [place] [name] 05/10/2020 12:45 PM Medical Record Number: [id number] Patient Account Number: [id number] Date of Birth/Sex: Treating RN: 02/28/1951 (69 y.o. [name] Primary Care Shahid Flori: [name] Other Clinician: Referring Kalise Fickett: Treating Shandiin Eisenbeis/Extender: Christella Hartigan in Treatment: 13 Wound Status Wound Number: 15 Primary Diabetic Wound/Ulcer of the Lower Extremity Etiology: Wound Location: Right, Anterior Lower Leg Wound Open Wounding Event: Gradually Appeared Status: Date Acquired: 04/26/2020 Comorbid Cataracts, Anemia, Asthma, Hypertension, Type II Diabetes, End Weeks Of Treatment: 2 History: Stage Renal Disease, Osteomyelitis, Neuropathy Clustered Wound: No Wound Measurements Length: (cm) Width: (cm) Depth: (cm) Area: (cm) Volume: (cm) 0 % Reduction in Area: 100% 0 % Reduction in Volume: 100% 0 Epithelialization: Large (67-100%) 0 Tunneling: No 0 Undermining: No Wound Description Classification: Grade 2 Exudate Amount: None Present Foul Odor After Cleansing: No Slough/Fibrino No Wound Bed Granulation Amount: None Present (0%) Exposed Structure Necrotic Amount: None Present (0%) Fascia Exposed: No Fat Layer (Subcutaneous Tissue) Exposed: No Tendon Exposed: No Muscle Exposed: No Joint Exposed: No Bone Exposed: No Electronic Signature(s) Signed: 05/11/2020 5:24:31 PM  By: Levan Hurst RN, BSN Entered By: Levan Hurst on 05/10/2020 13:22:58 -------------------------------------------------------------------------------- Wound  Assessment Details Patient Name: Date of Service: [facility] [name], [place] [name] 05/10/2020 12:45 PM Medical Record Number: [id number] Patient Account Number: [id number] Date of Birth/Sex: Treating RN: 12-27-1950 (69 y.o. [name] Primary Care Tristin Vandeusen: [name] Other Clinician: Referring Archie Atilano: Treating Jenilee Franey/Extender: Christella Hartigan in Treatment: 13 Wound Status Wound Number: 16 Primary Venous Leg Ulcer Etiology: Wound Location: Right, Medial Lower Leg Wound Open Wounding Event: Gradually Appeared Status: Date Acquired: 05/10/2020 Comorbid Cataracts, Anemia, Asthma, Hypertension, Type II Diabetes, End Weeks Of Treatment: 0 History: Stage Renal Disease, Osteomyelitis, Neuropathy Clustered Wound: No Wound Measurements Length: (cm) 0.9 Width: (cm) 0.8 Depth: (cm) 0.1 Area: (cm) 0.565 Volume: (cm) 0.057 % Reduction in Area: % Reduction in Volume: Epithelialization: None Tunneling: No Undermining: No Wound Description Classification: Full Thickness Without Exposed Support Structures Wound Margin: Flat and Intact Exudate Amount: Medium Exudate Type: Serosanguineous Exudate Color: red, brown Foul Odor After Cleansing: No Slough/Fibrino Yes Wound Bed Granulation Amount: Large (67-100%) Exposed Structure Granulation Quality: Pink Fascia Exposed: No Necrotic Amount: Small (1-33%) Fat Layer (Subcutaneous Tissue) Exposed: Yes Necrotic Quality: Adherent Slough Tendon Exposed: No Muscle Exposed: No Joint Exposed: No Bone Exposed: No Treatment Notes Wound #16 (Right, Medial Lower Leg) 1. Cleanse With Wound Cleanser Soap and water 2. Periwound Care Moisturizing lotion 3. Primary Dressing Applied Collegen AG Hydrogel or K-Y Jelly 4. Secondary Dressing ABD Pad Dry Gauze 6. Support Layer Applied 3 layer compression wrap Notes stockinette. Electronic Signature(s) Signed: 05/11/2020 5:24:31 PM By: Levan Hurst RN, BSN Entered  By: Levan Hurst on 05/10/2020 13:22:13 -------------------------------------------------------------------------------- Wound Assessment Details Patient Name: Date of Service: [name], [employer] [name] 05/10/2020 12:45 PM Medical Record Number: [id number] Patient Account Number: [id number] Date of Birth/Sex: Treating RN: [birth year] (69 y.o. [name] Primary Care Daekwon Beswick: [name] Other Clinician: Referring Gabrien Mentink: Treating [place]/Extender: Christella Hartigan in Treatment: 13 Wound Status Wound Number: 8 Primary Diabetic Wound/Ulcer of the Lower Extremity Etiology: Wound Location: Left T Third oe Wound Open Wounding Event: Blister Status: Date Acquired: 02/23/2020 Comorbid Cataracts, Anemia, Asthma, Hypertension, Type II Diabetes, End Weeks Of Treatment: 11 History: Stage Renal Disease, Osteomyelitis, Neuropathy Clustered Wound: No Wound Measurements Length: (cm) 0.7 Width: (cm) 1.1 Depth: (cm) 0.1 Area: (cm) 0.605 Volume: (cm) 0.06 % Reduction in Area: -329.1% % Reduction in Volume: -328.6% Epithelialization: None Tunneling: No Undermining: No Wound Description Classification: Grade 2 Wound Margin: Flat and Intact Exudate Amount: Medium Exudate Type: Serosanguineous Exudate Color: red, brown Foul Odor After Cleansing: No Slough/Fibrino Yes Wound Bed Granulation Amount: Small (1-33%) Exposed Structure Granulation Quality: Pink Fascia Exposed: No Necrotic Amount: Large (67-100%) Fat Layer (Subcutaneous Tissue) Exposed: Yes Necrotic Quality: Adherent Slough Tendon Exposed: No Muscle Exposed: No Joint Exposed: No Bone Exposed: No Treatment Notes Wound #8 (Left Toe Third) 1. Cleanse With Wound Cleanser 3. Primary Dressing Applied Collegen AG Hydrogel or K-Y Jelly 4. Secondary Dressing Dry Gauze Roll Gauze 5. Secured With [employer]) Signed: 05/11/2020 5:24:31 PM By: Levan Hurst RN,  BSN Entered By: Levan Hurst on 05/10/2020 13:23:20 -------------------------------------------------------------------------------- Vitals Details Patient Name: Date of Service: [name], [place] [name] 05/10/2020 12:45 PM Medical Record Number: [id number] Patient Account Number: [id number] Date of Birth/Sex: Treating RN: 1951-06-12 (69 y.o. [name] Primary Care Myliah Medel: [name] Other Clinician: Referring Kem Parcher: Treating Mika Griffitts/Extender: Christella Hartigan in Treatment: [phone]  Vital Signs Time Taken: 13:13 Temperature (F): 98.3 Height (in): 67 Pulse (bpm): 91 Weight (lbs): 202 Respiratory Rate (breaths/min): 18 Body Mass Index (BMI): 31.6 Blood Pressure (mmHg): 100/66 Capillary Blood Glucose (mg/dl): 89 Reference Range: 80 - 120 mg / dl Notes glucose per pt report Electronic Signature(s) Signed: 05/11/2020 5:24:31 PM By: Levan Hurst RN, BSN Entered By: Levan Hurst on 05/10/2020 13:15:22

## 2020-05-13 DIAGNOSIS — R300 Dysuria: Secondary | ICD-10-CM | POA: Diagnosis not present

## 2020-05-13 DIAGNOSIS — D631 Anemia in chronic kidney disease: Secondary | ICD-10-CM | POA: Diagnosis not present

## 2020-05-13 DIAGNOSIS — Z23 Encounter for immunization: Secondary | ICD-10-CM | POA: Diagnosis not present

## 2020-05-13 DIAGNOSIS — Z992 Dependence on renal dialysis: Secondary | ICD-10-CM | POA: Diagnosis not present

## 2020-05-13 DIAGNOSIS — N186 End stage renal disease: Secondary | ICD-10-CM | POA: Diagnosis not present

## 2020-05-13 DIAGNOSIS — N2581 Secondary hyperparathyroidism of renal origin: Secondary | ICD-10-CM | POA: Diagnosis not present

## 2020-05-13 DIAGNOSIS — D689 Coagulation defect, unspecified: Secondary | ICD-10-CM | POA: Diagnosis not present

## 2020-05-13 DIAGNOSIS — E1129 Type 2 diabetes mellitus with other diabetic kidney complication: Secondary | ICD-10-CM | POA: Diagnosis not present

## 2020-05-16 ENCOUNTER — Other Ambulatory Visit: Payer: Self-pay

## 2020-05-16 ENCOUNTER — Ambulatory Visit
Admission: RE | Admit: 2020-05-16 | Discharge: 2020-05-16 | Disposition: A | Discharge status: Home / Self Care | Payer: Medicare Other | Source: Ambulatory Visit | Attending: Physician Assistant | Admitting: Physician Assistant

## 2020-05-16 DIAGNOSIS — E1129 Type 2 diabetes mellitus with other diabetic kidney complication: Secondary | ICD-10-CM | POA: Diagnosis not present

## 2020-05-16 DIAGNOSIS — M7989 Other specified soft tissue disorders: Secondary | ICD-10-CM | POA: Diagnosis not present

## 2020-05-16 DIAGNOSIS — N2581 Secondary hyperparathyroidism of renal origin: Secondary | ICD-10-CM | POA: Diagnosis not present

## 2020-05-16 DIAGNOSIS — D631 Anemia in chronic kidney disease: Secondary | ICD-10-CM | POA: Diagnosis not present

## 2020-05-16 DIAGNOSIS — I872 Venous insufficiency (chronic) (peripheral): Secondary | ICD-10-CM

## 2020-05-16 DIAGNOSIS — Z23 Encounter for immunization: Secondary | ICD-10-CM | POA: Diagnosis not present

## 2020-05-16 DIAGNOSIS — Z992 Dependence on renal dialysis: Secondary | ICD-10-CM | POA: Diagnosis not present

## 2020-05-16 DIAGNOSIS — S81801A Unspecified open wound, right lower leg, initial encounter: Secondary | ICD-10-CM | POA: Diagnosis not present

## 2020-05-16 DIAGNOSIS — R300 Dysuria: Secondary | ICD-10-CM | POA: Diagnosis not present

## 2020-05-16 DIAGNOSIS — D689 Coagulation defect, unspecified: Secondary | ICD-10-CM | POA: Diagnosis not present

## 2020-05-16 DIAGNOSIS — N186 End stage renal disease: Secondary | ICD-10-CM | POA: Diagnosis not present

## 2020-05-16 NOTE — Consult Note (Signed)
Chief Complaint: Patient was seen in consultation today for right lower extremity wounds at the request of Stone,Hoyt Eday III  Referring Physician(s): Stone,Hoyt Eday III  History of Present Illness: [name] is a 69 y.o. female with multiple medical comorbidities who presents with a several month history of bilateral lower extremity swelling and spontaneous wound formation.  Her most recent wound is on the medial malleolar aspect of her right ankle.  She has been wearing compression garments and having the wounds treated which has helped both with her lower extremity swelling and facilitate healing.  Due to the presence of some upper thigh venous varicosities, she is currently referred to evaluate for underlying venous insufficiency as a contributing factor to the formation of these wounds.  Additional medical comorbidities of significance include end-stage renal disease on hemodialysis, diabetes and peripheral neuropathy.  She denies pain or symptoms associated with her varicose veins.  Past Medical History:  Diagnosis Date  . Anemia   . Anemia associated with chronic renal failure   . Anemia in chronic kidney disease 09/29/2018  . Arthritis   . Asthma   . Blood transfusion without reported diagnosis    Phreesia 02/27/2020  . Cancer ([place])    Phreesia 02/27/2020  . Cataract    OD  . Chronic kidney disease    Phreesia 02/27/2020  . Coronary artery calcification seen on CAT scan 02/17/2018   Coronary calcification on CT  . Depression   . Diabetes mellitus without complication ([place])    Phreesia 02/27/2020  . Diabetic retinopathy of both eyes ([place])   . Diabetic ulcer of right foot associated with type 2 diabetes mellitus ([place])   . ESRD (end stage renal disease) on dialysis ([place])   . Essential hypertension 02/17/2018   Essential hypertension  . GERD (gastroesophageal reflux disease)    pt denies  . HLD (hyperlipidemia)   . Hypertension   . Hypertensive retinopathy      OU  . Left ventricular dysfunction 04/07/2018   Left ventricle dysfunction  . Microalbuminuria due to type 2 diabetes mellitus ([place])   . Neuromuscular disorder ([place])    diabetic neuropathy  . Nonproliferative retinopathy due to secondary diabetes ([place])   . Nonsustained ventricular tachycardia ([place]) 08/28/2018   Nonsustained ventricular tachycardia  . Pneumonia    hx of   . Renal cell cancer ([place])   . Right renal mass 03/10/2017  . Type 2 diabetes, controlled, with neuropathy ([place]) 06/22/2013  . Ulcer of other part of foot 06/22/2013  . 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 AMPUTATION TOE Right 10/29/2019   Procedure: RIGHT 2ND TOE AMPUTATION;  Surgeon: Edrick Kins, DPM;  Location: [facility];  Service: Podiatry;  Laterality: Right;  . BASCILIC VEIN TRANSPOSITION Left 08/08/2017   Procedure: BASILIC VEIN TRANSPOSITION FIRST STAGE LEFT ARM;  Surgeon: Serafina Mitchell, MD;  Location: [place];  Service: Vascular;  Laterality: Left;  . BASCILIC VEIN TRANSPOSITION Left 05/14/2018   Procedure: SECOND STAGE BASILIC VEIN TRANSPOSITION LEFT ARM;  Surgeon: Serafina Mitchell, MD;  Location: [place];  Service: Vascular;  Laterality: Left;  . CATARACT EXTRACTION Left   . EYE SURGERY    . ROBOTIC ADRENALECTOMY Left 03/10/2017   Procedure: XI ROBOTIC ADRENALECTOMY;  Surgeon: Nickie Retort, MD;  Location: [facility];  Service: Urology;  Laterality: Left;  . ROBOTIC ASSITED PARTIAL NEPHRECTOMY Right 03/10/2017   Procedure:  XI ROBOTIC ASSITED RADICAL NEPHRECTOMY;  Surgeon: Nickie Retort, MD;  Location: [facility];  Service: Urology;  Laterality: Right;  . TOE AMPUTATION Bilateral    both great toe ,, left foot 2nd toe 1/2    Allergies: Penicillins and Adhesive [tape]  Medications: Prior to Admission medications   Medication Sig Start Date End Date Taking? Authorizing Provider  bisacodyl (BISACODYL) 5 MG EC tablet Take 10 mg  by mouth daily as needed for moderate constipation.     [provider]  Blood Glucose Monitoring Suppl (ONE TOUCH ULTRA 2) w/Device KIT Use to check BS BID-QID Dx:E11.9 05/19/17   [name] Frizzle, MD  BREO ELLIPTA 100-25 MCG/INH AEPB Inhale 1 puff by mouth once daily Patient taking differently: Inhale 1 puff into the lungs daily.  05/21/19   [name] Frizzle, MD  Carboxymethylcellulose Sodium (THERATEARS OP) Place 1 drop into both eyes daily as needed (dry eyes).    [provider]  diphenhydrAMINE (BENADRYL) 25 MG tablet Take 25 mg by mouth daily as needed for allergies.    [provider]  gabapentin (NEURONTIN) 300 MG capsule TAKE 1 CAPSULE BY MOUTH THREE TIMES DAILY. REQUIRES OFFICE VISIT BEFORE ANY FURTHER REFILLS CAN BE GIVEN 03/29/20   [name] Frizzle, MD  insulin degludec (TRESIBA FLEXTOUCH) 100 UNIT/ML FlexTouch Pen Inject 0.3 mLs (30 Units total) into the skin daily. Hold if fasting blood sugars are <130. 02/11/20   [name] Frizzle, MD  Insulin Pen Needle (LIVE BETTER PEN NEEDLES) 31G X 6 MM MISC To use with Tresiba pens daily 02/05/19   [name] Frizzle, MD  Lancets [facility] ULTRASOFT) lancets Use as instructed 05/19/17   [name] Frizzle, MD  lidocaine-prilocaine (EMLA) cream Apply 1 application topically as needed (port access).    [provider]  Methoxy PEG-Epoetin Beta (MIRCERA IJ) Mircera 11/09/19 11/07/20  [provider]  multivitamin (RENA-VIT) TABS tablet Take 1 tablet by mouth daily.    [provider]  [facility] ULTRA test strip USE AS DIRECTED TO MONITOR  FSBS 3 TIMES DAILY 01/27/20   Annie Main, FNP  oxyCODONE-acetaminophen (PERCOCET) 5-325 MG tablet Take 1 tablet by mouth every 6 (six) hours as needed for severe pain. Patient not taking: Reported on 03/01/2020 10/29/19   Edrick Kins, DPM  PROAIR HFA 108 [phone] Base) MCG/ACT inhaler INHALE 2 PUFFS BY MOUTH EVERY 6 HOURS AS NEEDED FOR WHEEZING FOR SHORTNESS OF BREATH  05/08/20   [name] Frizzle, MD  rosuvastatin (CRESTOR) 20 MG tablet Take 1 tablet (20 mg total) by mouth daily. 11/23/19   [name] Frizzle, MD  sevelamer carbonate (RENVELA) 800 MG tablet Take 800 mg by mouth 2 (two) times daily with a meal.    [provider]     Family History  Problem Relation Age of Onset  . Heart disease Mother   . Diabetes Sister     Social History   Socioeconomic History  . Marital status: Single    Spouse name: Not on file  . Number of children: 2  . Years of education: 65  . Highest education level: Not on file  Occupational History  . Not on file  Tobacco Use  . Smoking status: Never Smoker  . Smokeless tobacco: Never Used  Vaping Use  . Vaping Use: Never used  Substance and Sexual Activity  . Alcohol use: Yes    Alcohol/week: 0.0 standard drinks    Comment: occ  . Drug use: No  .  Sexual activity: Not Currently    Partners: Male  Other Topics Concern  . Not on file  Social History Narrative  . Not on file   Social Determinants of Health   Financial Resource Strain: Low Risk   . Difficulty of Paying Living Expenses: Not very hard  Food Insecurity: No Food Insecurity  . Worried About Charity fundraiser in the Last Year: Never true  . Ran Out of Food in the Last Year: Never true  Transportation Needs: No Transportation Needs  . Lack of Transportation (Medical): No  . Lack of Transportation (Non-Medical): No  Physical Activity:   . Days of Exercise per Week: Not on file  . Minutes of Exercise per Session: Not on file  Stress:   . Feeling of Stress : Not on file  Social Connections:   . Frequency of Communication with Friends and Family: Not on file  . Frequency of Social Gatherings with Friends and Family: Not on file  . Attends Religious Services: Not on file  . Active Member of Clubs or Organizations: Not on file  . Attends Archivist Meetings: Not on file  . Marital Status: Not on file    Review of Systems:  A 12 point ROS discussed and pertinent positives are indicated in the HPI above.  All other systems are negative.  Review of Systems  Vital Signs: There were no vitals taken for this visit.  Physical Exam Vitals reviewed. Exam conducted with a chaperone present.  Constitutional:      Appearance: Normal appearance.  HENT:     Head: Normocephalic and atraumatic.  Eyes:     General: No scleral icterus. Cardiovascular:     Rate and Rhythm: Normal rate.  Pulmonary:     Effort: Pulmonary effort is normal.  Musculoskeletal:     Right lower leg: No edema.     Left lower leg: No edema.  Skin:    General: Skin is warm and dry.     Comments: Small scabs and healing sores scattered across the dorsal forearms bilaterally.    Several healed scars noted along the anterior and medial aspect of the left lower extremity from just below the knee to the ankle.    Approximately 1 cm healing ulceration at the medial aspect of the right ankle just cephalad to the malleolus.  There are some associated surrounding skin changes including hyperpigmentation and scarring.    Neurological:     Mental Status: She is alert and oriented to person, place, and time.  Psychiatric:        Mood and Affect: Mood normal.        Behavior: Behavior normal.      Imaging: [place] RAD EVAL AND MGMT  Result Date: 05/16/2020 Please refer to "Notes" to see consult details.   Labs:  CBC: Recent Labs    10/29/19 1530 11/03/19 1131 11/08/19 0852 02/07/20 1159  WBC 6.4 5.2 8.6 7.3  HGB 9.8* 9.0* 8.9* 9.9*  HCT 30.9* 27.6* 28.0* 29.7*  PLT 189 205 179 213    COAGS: No results for input(s): INR, APTT in the last 8760 hours.  BMP: Recent Labs    10/29/19 1530 11/03/19 1131 11/08/19 0852 02/07/20 1159  NA 138 141 140 143  K 3.9 3.8 3.8 5.2  CL 97* 98 98 99  CO2 _0 32  GLUCOSE 140* 126* 192* 102*  BUN 18 16 46* 27*  CALCIUM 8.5* 8.1* 8.2* 8.7  CREATININE 4.66* 4.00* 7.38*  6.19*  GFRNONAA 9*  --   5* 6*  GFRAA 10*  --  6* 7*    LIVER FUNCTION TESTS: Recent Labs    11/03/19 1131 11/08/19 0852 02/07/20 1159  BILITOT 0.5 0.9 0.7  AST _0 ALT 3* 9 7  ALKPHOS  --  59  --   PROT 6.2 7.1 6.9  ALBUMIN  --  3.0*  --     TUMOR MARKERS: No results for input(s): AFPTM, CEA, CA199, CHROMGRNA in the last 8760 hours.  Assessment and Plan:  Level 2 ultrasound performed today both in the reverse Trendelenburg position, and ending demonstrates no evidence of clinically significant venous insufficiency.  Unfortunately, I believe her skin ulcerations are due to some other etiology.  This does make some clinical sense given the fact that she has similar lesions on the dorsal aspect of both of her forearms which could not be explained by venous insufficiency.  She may be experiencing some type of contact dermatitis, or skin related issue secondary to her end-stage renal disease.  She is already wearing compression garments which is helping with her lower extremity edema.  Otherwise, I have no intervention or treatment to offer her given that her superficial venous system is functioning normally.  Thank you for this interesting consult.  I greatly enjoyed meeting [name] and look forward to participating in their care.  A copy of this report was sent to the requesting provider on this date.  Electronically Signed: Criselda Peaches 05/16/2020, 11:15 AM   I spent a total of  15 Minutes  in face to face in clinical consultation, greater than 50% of which was counseling/coordinating care for lower extremity wounds, possible venous insufficiency.

## 2020-05-17 ENCOUNTER — Encounter (HOSPITAL_BASED_OUTPATIENT_CLINIC_OR_DEPARTMENT_OTHER): Payer: Medicare Other | Admitting: Physician Assistant

## 2020-05-17 DIAGNOSIS — L97812 Non-pressure chronic ulcer of other part of right lower leg with fat layer exposed: Secondary | ICD-10-CM | POA: Diagnosis not present

## 2020-05-17 DIAGNOSIS — I120 Hypertensive chronic kidney disease with stage 5 chronic kidney disease or end stage renal disease: Secondary | ICD-10-CM | POA: Diagnosis not present

## 2020-05-17 DIAGNOSIS — E11621 Type 2 diabetes mellitus with foot ulcer: Secondary | ICD-10-CM | POA: Diagnosis not present

## 2020-05-17 DIAGNOSIS — Z992 Dependence on renal dialysis: Secondary | ICD-10-CM | POA: Diagnosis not present

## 2020-05-17 DIAGNOSIS — I872 Venous insufficiency (chronic) (peripheral): Secondary | ICD-10-CM | POA: Diagnosis not present

## 2020-05-17 DIAGNOSIS — E1122 Type 2 diabetes mellitus with diabetic chronic kidney disease: Secondary | ICD-10-CM | POA: Diagnosis not present

## 2020-05-17 DIAGNOSIS — L97312 Non-pressure chronic ulcer of right ankle with fat layer exposed: Secondary | ICD-10-CM | POA: Diagnosis not present

## 2020-05-17 DIAGNOSIS — L97522 Non-pressure chronic ulcer of other part of left foot with fat layer exposed: Secondary | ICD-10-CM | POA: Diagnosis not present

## 2020-05-17 DIAGNOSIS — L97822 Non-pressure chronic ulcer of other part of left lower leg with fat layer exposed: Secondary | ICD-10-CM | POA: Diagnosis not present

## 2020-05-17 DIAGNOSIS — L97512 Non-pressure chronic ulcer of other part of right foot with fat layer exposed: Secondary | ICD-10-CM | POA: Diagnosis not present

## 2020-05-17 DIAGNOSIS — N186 End stage renal disease: Secondary | ICD-10-CM | POA: Diagnosis not present

## 2020-05-17 NOTE — Progress Notes (Addendum)
AMITY, ROES ([id number]) Visit Report for 05/17/2020 Chief Complaint Document Details Patient Name: Date of Service: [name], [name] 05/17/2020 12:45 PM Medical Record Number: [id number] Patient Account Number: [id number] Date of Birth/Sex: Treating RN: Jul 24, 1950 (69 y.o. [name] Primary Care Provider: [name] Other Clinician: Referring Provider: Treating Provider/Extender: Christella Hartigan in Treatment: 14 Information Obtained from: Patient Chief Complaint Multiple LE Ulcers Electronic Signature(s) Signed: 05/17/2020 12:44:59 PM By: [name] PA-C Entered By: [name] on 05/17/2020 12:44:58 -------------------------------------------------------------------------------- Debridement Details Patient Name: Date of Service: [facility] [name], [place] [name] 05/17/2020 12:45 PM Medical Record Number: [id number] Patient Account Number: [id number] Date of Birth/Sex: Treating RN: June 28, 1951 (69 y.o. [name] Primary Care Provider: [name] Other Clinician: Referring Provider: Treating Provider/Extender: Christella Hartigan in Treatment: 14 Debridement Performed for Assessment: Wound #8 Left T Third oe Performed By: Physician [name], PA Debridement Type: Debridement Severity of Tissue Pre Debridement: Fat layer exposed Level of Consciousness (Pre-procedure): Awake and Alert Pre-procedure Verification/Time Out Yes - 13:00 Taken: Start Time: 13:00 Pain Control: Lidocaine 4% T opical Solution T Area Debrided (L x W): otal 0.4 (cm) x 0.5 (cm) = 0.2 (cm) Tissue and other material debrided: Viable, Non-Viable, Slough, Subcutaneous, Fibrin/Exudate, Slough Level: Skin/Subcutaneous Tissue Debridement Description: Excisional Instrument: Curette Bleeding: Minimum Hemostasis Achieved: Pressure End Time: 13:02 Procedural Pain: 0 Post Procedural Pain: 0 Response to Treatment: Procedure was tolerated  well Level of Consciousness (Post- Awake and Alert procedure): Post Debridement Measurements of Total Wound Length: (cm) 0.4 Width: (cm) 0.5 Depth: (cm) 0.1 Volume: (cm) 0.016 Character of Wound/Ulcer Post Debridement: Improved Severity of Tissue Post Debridement: Fat layer exposed Post Procedure Diagnosis Same as Pre-procedure Electronic Signature(s) Signed: 05/17/2020 4:47:33 PM By: [name] PA-C Signed: 05/17/2020 5:36:04 PM By: [name] RN, BSN Entered By: [name] on 05/17/2020 13:03:06 -------------------------------------------------------------------------------- HPI Details Patient Name: Date of Service: [name], [place] [name] 05/17/2020 12:45 PM Medical Record Number: [id number] Patient Account Number: [id number] Date of Birth/Sex: Treating RN: 11-01-1950 (69 y.o. [name] Primary Care Provider: [name] Other Clinician: Referring Provider: Treating Provider/Extender: Christella Hartigan in Treatment: 14 History of Present Illness HPI Description: Evaluate7/21/2021 today patient presents for evaluation of ulcerations on her legs which she tells me tend to come and go as far as small blistering locations and sometimes are better than other times. Right now she tells me that this is actually doing a little bit better but nonetheless will not completely go away. She has ulcerations bilaterally on her lower extremities. The right is worse than the left. 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 The patient has no evidence of active infection at this time. She however appears to have proficient blood flow into the extremities and I think would tolerate a 3 layer compression wrap she was noncompressible on arterial studies. Nonetheless there was no obvious signs of occlusion. 02/16/2020 on evaluation today patient  appears to be doing much better in regard to her wounds in general. On the past week she has made significant improvements which is great news there is no signs of active infection at this time. No fevers, chills, nausea, vomiting, or diarrhea. 02/23/2020 on evaluation today patient appears to be doing well in regard to her lower extremity ulcers and ankle ulcers. Overall I feel  like she is making great progress and in general I am extremely pleased with how things stand. There is no sign of active infection at this time which is also good news. She will require slight debridement around the right ankle region. 03/02/20-Patient returns at 1 week, has few new areas open on the left leg, especially 1 healed area on the medial ankle that has slight small open area now, to anterior leg areas that started out as blisters that are open now. She was using juxta lite compression to the left, she used Band-Aid to cover the vesicle that had ruptured on the left anterior leg, we are using 3 layer compression on the right. We are using silver alginate to the toe wound on the left. 03/08/2020 upon evaluation today patient unfortunately is doing much worse in regard to her right leg although her left leg looks like is doing better. Again we have taken her compression wraps off last week since she was doing well and transitioned her to her Velcro compression wrap. However apparently the patient is stating this was not put on here in the clinic and she never put it on at home. With that being said unfortunately she developed swelling and opened up new wounds which are present today she has quite a few of them in fact. 03/15/2020 on evaluation today patient appears to be doing well with regard to her wounds on the lower extremities bilaterally. Fortunately there is no signs of active infection at this time. No fever chills noted. Overall I am actually very pleased with where things stand currently. 03/22/2020 on evaluation  today patient appears to be doing fairly well in regard to her wounds in general. She just has really one area left on her toe everything else is healed on her legs. She does have her Velcro compression wraps which I think are appropriate at this point. Fortunately there is no signs of active infection at this time. No fevers, chills, nausea, vomiting, or diarrhea. 03/29/2020 on evaluation today patient appears to be doing poorly in regard to her right leg which is pretty much completely reopened compared to last week. She states it was okay until Saturday when she woke up she had blisters. With that being said it appears that this is likely stemming from the fact that the patient is apparently though I just found this out today going to sleep in her bed with her legs up on the bed with her but throughout the night she tends to end up with her legs hanging off the side or bottom of the bed on the floor. With that being said I think that this likely is happening much too long and then she has significant swelling the builds up as she sleeps. Obviously I think this is the reasoning behind what we are seeing currently. 04/05/2020 on evaluation today patient appears to be doing quite well with regard to her leg ulcers at this point. Fortunately there is no signs of anything significantly open again which is great news nonetheless I am to continue wrapping her for at least a couple of weeks due to the fact that she seems to continually reopen as soon as we unwrapped her. 04/12/2020 upon evaluation today patient appears to be doing excellent in regard to her leg ulcers all appear to be improving which is great news. She is also doing better in regard to the toe the infection seems to be under better control and the Santyl is helping to clean this  up. Overall very pleased much more so than what I saw last week. 04/19/2020 on evaluation today patient appears to be doing well with regard to her leg ulcers which are  showing no signs of opening at this point. Her toe is also showing signs of improvement she is about done with her antibiotic. Overall I feel like things are doing quite well. 04/26/2020 upon evaluation today patient appears to be doing unfortunately a little bit worse compared to previous. She has reopened wounds on the right anterior lower extremity. This is unfortunate as we were hoping that this would really maintain healing. That does not appear to be the case. I think with him the need to see about making a referral for venous studies and provider evaluation with [facility] imaging with the interventional radiologist. 08/11/2019 upon evaluation today patient appears to be doing well in general in regard to her legs. Her right leg she does have a new wound that opened since last week. On the left leg everything appears to be healed she does still have the opening on her toe which we are managing but again this does not appear to be any worse. She just needs to get this to feeling and cover over. There is no evidence of infection which is at least great news. 05/17/2020 upon evaluation today patient appears to be doing decently well in regard to her leg ulcers this is good news. She does have still an opening on the left second toe. She has been let this dry out that not really what we want to see currently. For that reason I discussed with her again the fact that she needs to keep this covered and not allow it to dry out in order to allow for appropriate healing. She voiced understanding and states she did not know that. Electronic Signature(s) Signed: 05/17/2020 1:45:00 PM By: [name] PA-C Entered By: [name] on 05/17/2020 13:44:59 -------------------------------------------------------------------------------- Physical Exam Details Patient Name: Date of Service: [facility] [name], [place] [name] 05/17/2020 12:45 PM Medical Record Number: [id number] Patient Account Number: [id number] Date of  Birth/Sex: Treating RN: Mar 16, 1951 (69 y.o. [name] Primary Care Provider: [name] Other Clinician: Referring Provider: Treating Provider/Extender: Christella Hartigan in Treatment: 55 Constitutional Well-nourished and well-hydrated in no acute distress. Respiratory normal breathing without difficulty. Psychiatric this patient is able to make decisions and demonstrates good insight into disease process. Alert and Oriented x 3. pleasant and cooperative. Notes Upon inspection patient's wound bed actually showed signs of good granulation and epithelization as far as her legs are concerned and very pleased in this regard we will still wrap her right leg 1 more week at least to make sure this remains closed. Shoes are Velcro wrap on the left. In regard to her toe she is to keep this clean and covered as far as moisture is concerned is much too dry to allow appropriate healing I did clean this off by sharp debridement today. Electronic Signature(s) Signed: 05/17/2020 1:45:30 PM By: [name] PA-C Entered By: [name] on 05/17/2020 13:45:29 -------------------------------------------------------------------------------- Physician Orders Details Patient Name: Date of Service: [facility] [name], [place] [name] 05/17/2020 12:45 PM Medical Record Number: [id number] Patient Account Number: [id number] Date of Birth/Sex: Treating RN: [birth year] (69 y.o. [name] Primary Care Provider: [name] Other Clinician: Referring Provider: Treating Provider/Extender: Christella Hartigan in Treatment: [phone] Verbal / Phone Orders: No Diagnosis Coding ICD-10 Coding Code Description I87.2 Venous  insufficiency (chronic) (peripheral) E11.621 Type 2 diabetes mellitus with foot ulcer L97.822 Non-pressure chronic ulcer of other part of left lower leg with fat layer exposed L97.312 Non-pressure chronic ulcer of right ankle with fat layer  exposed L97.812 Non-pressure chronic ulcer of other part of right lower leg with fat layer exposed L97.512 Non-pressure chronic ulcer of other part of right foot with fat layer exposed N18.6 End stage renal disease Z99.2 Dependence on renal dialysis I10 Essential (primary) hypertension Follow-up Appointments Return Appointment in 1 week. Dressing Change Frequency Wound #16 Right,Medial Lower Leg Do not change entire dressing for one week. Wound #8 Left T Third oe Change Dressing every other day. Skin Barriers/Peri-Wound Care Moisturizing lotion - to both legs daily Wound Cleansing Wound #16 Right,Medial Lower Leg May shower with protection. Wound #8 Left T Third oe May shower and wash wound with soap and water. Primary Wound Dressing Wound #8 Left T Third oe Silver Collagen Secondary Dressing Wound #8 Left T Third oe Kerlix/Rolled Gauze Dry Gauze Wound #16 Right,Medial Lower Leg Foam Border Edema Control 3 Layer Compression System - Right Lower Extremity Avoid standing for long periods of time Elevate legs to the level of the heart or above for 30 minutes daily and/or when sitting, a frequency of: - throughout the day Exercise regularly Support Garment 20-30 mm/Hg pressure to: - juxtalite compression garments left leg daily Electronic Signature(s) Signed: 05/17/2020 4:47:33 PM By: [name] PA-C Signed: 05/17/2020 5:36:04 PM By: [name] RN, BSN Entered By: [name] on 05/17/2020 13:04:48 -------------------------------------------------------------------------------- Problem List Details Patient Name: Date of Service: [facility] [name], [place] [name] 05/17/2020 12:45 PM Medical Record Number: [id number] Patient Account Number: [id number] Date of Birth/Sex: Treating RN: 11-22-1950 (69 y.o. [name] Primary Care Provider: [name] Other Clinician: Referring Provider: Treating Provider/Extender: Christella Hartigan in  Treatment: [id number] Active Problems ICD-10 Encounter Code Description Active Date MDM Diagnosis I87.2 Venous insufficiency (chronic) (peripheral) 02/09/2020 No Yes E11.621 Type 2 diabetes mellitus with foot ulcer 02/09/2020 No Yes L97.822 Non-pressure chronic ulcer of other part of left lower leg with fat layer exposed7/21/2021 No Yes L97.312 Non-pressure chronic ulcer of right ankle with fat layer exposed 02/09/2020 No Yes L97.812 Non-pressure chronic ulcer of other part of right lower leg with fat layer 02/09/2020 No Yes exposed L97.512 Non-pressure chronic ulcer of other part of right foot with fat layer exposed 02/09/2020 No Yes N18.6 End stage renal disease 02/09/2020 No Yes Z99.2 Dependence on renal dialysis 02/09/2020 No Yes I10 Essential (primary) hypertension 02/09/2020 No Yes Inactive Problems Resolved Problems Electronic Signature(s) Signed: 05/17/2020 12:44:52 PM By: [name] PA-C Entered By: [name] on 05/17/2020 12:44:51 -------------------------------------------------------------------------------- Progress Note Details Patient Name: Date of Service: [facility] [name], [place] [name] 05/17/2020 12:45 PM Medical Record Number: [id number] Patient Account Number: [id number] Date of Birth/Sex: Treating RN: 23-Sep-1950 (69 y.o. [name] Primary Care Provider: [name] Other Clinician: Referring Provider: Treating Provider/Extender: Christella Hartigan in Treatment: 14 Subjective Chief Complaint Information obtained from Patient Multiple LE Ulcers History of Present Illness (HPI) Evaluate7/21/2021 today patient presents for evaluation of ulcerations on her legs which she tells me tend to come and go as far as small blistering locations and sometimes are better than other times. Right now she tells me that this is actually doing a little bit better but nonetheless will not completely go away. She has ulcerations bilaterally on her lower extremities.  The right is worse  than the left. 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 The patient has no evidence of active infection at this time. She however appears to have proficient blood flow into the extremities and I think would tolerate a 3 layer compression wrap she was noncompressible on arterial studies. Nonetheless there was no obvious signs of occlusion. 02/16/2020 on evaluation today patient appears to be doing much better in regard to her wounds in general. On the past week she has made significant improvements which is great news there is no signs of active infection at this time. No fevers, chills, nausea, vomiting, or diarrhea. 02/23/2020 on evaluation today patient appears to be doing well in regard to her lower extremity ulcers and ankle ulcers. Overall I feel like she is making great progress and in general I am extremely pleased with how things stand. There is no sign of active infection at this time which is also good news. She will require slight debridement around the right ankle region. 03/02/20-Patient returns at 1 week, has few new areas open on the left leg, especially 1 healed area on the medial ankle that has slight small open area now, to anterior leg areas that started out as blisters that are open now. She was using juxta lite compression to the left, she used Band-Aid to cover the vesicle that had ruptured on the left anterior leg, we are using 3 layer compression on the right. We are using silver alginate to the toe wound on the left. 03/08/2020 upon evaluation today patient unfortunately is doing much worse in regard to her right leg although her left leg looks like is doing better. Again we have taken her compression wraps off last week since she was doing well and transitioned her to her Velcro compression wrap. However apparently the patient is stating this was  not put on here in the clinic and she never put it on at home. With that being said unfortunately she developed swelling and opened up new wounds which are present today she has quite a few of them in fact. 03/15/2020 on evaluation today patient appears to be doing well with regard to her wounds on the lower extremities bilaterally. Fortunately there is no signs of active infection at this time. No fever chills noted. Overall I am actually very pleased with where things stand currently. 03/22/2020 on evaluation today patient appears to be doing fairly well in regard to her wounds in general. She just has really one area left on her toe everything else is healed on her legs. She does have her Velcro compression wraps which I think are appropriate at this point. Fortunately there is no signs of active infection at this time. No fevers, chills, nausea, vomiting, or diarrhea. 03/29/2020 on evaluation today patient appears to be doing poorly in regard to her right leg which is pretty much completely reopened compared to last week. She states it was okay until Saturday when she woke up she had blisters. With that being said it appears that this is likely stemming from the fact that the patient is apparently though I just found this out today going to sleep in her bed with her legs up on the bed with her but throughout the night she tends to end up with her legs hanging off the side or bottom of the bed on the floor. With that being said I think that this likely is happening  much too long and then she has significant swelling the builds up as she sleeps. Obviously I think this is the reasoning behind what we are seeing currently. 04/05/2020 on evaluation today patient appears to be doing quite well with regard to her leg ulcers at this point. Fortunately there is no signs of anything significantly open again which is great news nonetheless I am to continue wrapping her for at least a couple of weeks due to the fact  that she seems to continually reopen as soon as we unwrapped her. 04/12/2020 upon evaluation today patient appears to be doing excellent in regard to her leg ulcers all appear to be improving which is great news. She is also doing better in regard to the toe the infection seems to be under better control and the Santyl is helping to clean this up. Overall very pleased much more so than what I saw last week. 04/19/2020 on evaluation today patient appears to be doing well with regard to her leg ulcers which are showing no signs of opening at this point. Her toe is also showing signs of improvement she is about done with her antibiotic. Overall I feel like things are doing quite well. 04/26/2020 upon evaluation today patient appears to be doing unfortunately a little bit worse compared to previous. She has reopened wounds on the right anterior lower extremity. This is unfortunate as we were hoping that this would really maintain healing. That does not appear to be the case. I think with him the need to see about making a referral for venous studies and provider evaluation with [facility] imaging with the interventional radiologist. 08/11/2019 upon evaluation today patient appears to be doing well in general in regard to her legs. Her right leg she does have a new wound that opened since last week. On the left leg everything appears to be healed she does still have the opening on her toe which we are managing but again this does not appear to be any worse. She just needs to get this to feeling and cover over. There is no evidence of infection which is at least great news. 05/17/2020 upon evaluation today patient appears to be doing decently well in regard to her leg ulcers this is good news. She does have still an opening on the left second toe. She has been let this dry out that not really what we want to see currently. For that reason I discussed with her again the fact that she needs to keep this covered  and not allow it to dry out in order to allow for appropriate healing. She voiced understanding and states she did not know that. Objective Constitutional Well-nourished and well-hydrated in no acute distress. Vitals Time Taken: 12:50 PM, Height: 67 in, Source: Stated, Weight: 202 lbs, Source: Stated, BMI: 31.6, Temperature: 98.5 F, Pulse: 101 bpm, Respiratory Rate: 18 breaths/min, Blood Pressure: 103/71 mmHg, Capillary Blood Glucose: 121 mg/dl. General Notes: glucose per pt report this am Respiratory normal breathing without difficulty. Psychiatric this patient is able to make decisions and demonstrates good insight into disease process. Alert and Oriented x 3. pleasant and cooperative. General Notes: Upon inspection patient's wound bed actually showed signs of good granulation and epithelization as far as her legs are concerned and very pleased in this regard we will still wrap her right leg 1 more week at least to make sure this remains closed. Shoes are Velcro wrap on the left. In regard to her toe she is to keep this  clean and covered as far as moisture is concerned is much too dry to allow appropriate healing I did clean this off by sharp debridement today. Integumentary (Hair, Skin) Wound #16 status is Open. Original cause of wound was Gradually Appeared. The wound is located on the Right,Medial Lower Leg. The wound measures 0cm length x 0cm width x 0cm depth; 0cm^2 area and 0cm^3 volume. There is no tunneling or undermining noted. There is a none present amount of drainage noted. The wound margin is flat and intact. There is no granulation within the wound bed. There is no necrotic tissue within the wound bed. Wound #8 status is Open. Original cause of wound was Blister. The wound is located on the Left T Third. The wound measures 0.4cm length x 0.6cm width x oe 0.1cm depth; 0.188cm^2 area and 0.019cm^3 volume. There is Fat Layer (Subcutaneous Tissue) exposed. There is no tunneling or  undermining noted. There is a none present amount of drainage noted. The wound margin is flat and intact. There is no granulation within the wound bed. There is a large (67-100%) amount of necrotic tissue within the wound bed including Adherent Slough. 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 There was a Excisional Skin/Subcutaneous Tissue Debridement with a total area of 0.2 sq cm performed by [name], PA. With the following instrument(s): Curette to remove Viable and Non-Viable tissue/material. Material removed includes Subcutaneous Tissue, Slough, and Fibrin/Exudate after achieving pain control using Lidocaine 4% Topical Solution. No specimens were taken. A time out was conducted at 13:00, prior to the start of the procedure. A Minimum amount of bleeding was controlled with Pressure. The procedure was tolerated well with a pain level of 0 throughout and a pain level of 0 following the procedure. Post Debridement Measurements: 0.4cm length x 0.5cm width x 0.1cm depth; 0.016cm^3 volume. Character of Wound/Ulcer Post Debridement is improved. Severity of Tissue Post Debridement is: Fat layer exposed. Post procedure Diagnosis Wound #8: Same as Pre-Procedure Wound #16 Pre-procedure diagnosis of Wound #16 is  a Venous Leg Ulcer located on the Right,Medial Lower Leg . There was a Three Layer Compression Therapy Procedure by [name], RN. Post procedure Diagnosis Wound #16: Same as Pre-Procedure Plan Follow-up Appointments: Return Appointment in 1 week. Dressing Change Frequency: Wound #16 Right,Medial Lower Leg: Do not change entire dressing for one week. Wound #8 Left T Third: oe Change Dressing every other day. Skin Barriers/Peri-Wound Care: Moisturizing lotion - to both legs daily Wound Cleansing: Wound #16 Right,Medial Lower Leg: May shower with protection. Wound #8 Left T Third: oe May shower and wash wound with soap and water. Primary Wound Dressing: Wound #8 Left T Third: oe Silver Collagen Secondary Dressing: Wound #8 Left T Third: oe Kerlix/Rolled Gauze Dry Gauze Wound #16 Right,Medial Lower Leg: Foam Border Edema Control: 3 Layer Compression System - Right Lower Extremity Avoid standing for long periods  of time Elevate legs to the level of the heart or above for 30 minutes daily and/or when sitting, a frequency of: - throughout the day Exercise regularly Support Garment 20-30 mm/Hg pressure to: - juxtalite compression garments left leg daily 1 would recommend currently that we going continue with the wound care measures as before using collagen for the toe I think that is appropriate. 2. I did advise the patient she needs to keep this covered at all times as of utmost importance. 3. I also recommend that the patient go ahead and continue with 3 layer compression wrap to the right lower extremity for protection until we can make sure that this epithelializes much better once it does she should be ready to remove that and go into her Velcro wrap at that point. We will see patient back for reevaluation in 1 week here in the clinic. If anything worsens or changes patient will contact our office for additional recommendations. Electronic Signature(s) Signed: 05/17/2020  1:46:01 PM By: [name] PA-C Entered By: [name] on 05/17/2020 13:46:01 -------------------------------------------------------------------------------- SuperBill Details Patient Name: Date of Service: [facility] [name], [place] [name] 05/17/2020 Medical Record Number: [id number] Patient Account Number: [id number] Date of Birth/Sex: Treating RN: 1950-11-27 (69 y.o. 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.312 Non-pressure chronic ulcer of right ankle with fat layer exposed L97.812 Non-pressure chronic ulcer of other part of right lower leg with fat layer exposed L97.512 Non-pressure chronic ulcer of other part of right foot with fat layer exposed N18.6 End stage renal disease Z99.2 Dependence on renal dialysis I10 Essential (primary) hypertension Facility Procedures CPT4 Code: 29562130 Description: 86578 - DEB SUBQ TISSUE 20 SQ CM/< ICD-10 Diagnosis Description L97.822 Non-pressure chronic ulcer of other part of left lower leg with fat layer exposed Modifier: Quantity: 1 CPT4 Code: 46962952 Description: (Facility Use Only) 84132GM - [place] COMPRS LWR RT LEG Modifier: 59 Quantity: 1 Physician Procedures : CPT4 Code Description Modifier [id number] 36644 - WC PHYS SUBQ TISS 20 SQ CM ICD-10 Diagnosis Description L97.822 Non-pressure chronic ulcer of other part of left lower leg with fat layer exposed Quantity: 1 Electronic Signature(s) Signed: 05/17/2020 1:46:14 PM By: [name] PA-C Entered By: [name] on 05/17/2020 13:46:13

## 2020-05-18 DIAGNOSIS — R300 Dysuria: Secondary | ICD-10-CM | POA: Diagnosis not present

## 2020-05-18 DIAGNOSIS — E1129 Type 2 diabetes mellitus with other diabetic kidney complication: Secondary | ICD-10-CM | POA: Diagnosis not present

## 2020-05-18 DIAGNOSIS — D631 Anemia in chronic kidney disease: Secondary | ICD-10-CM | POA: Diagnosis not present

## 2020-05-18 DIAGNOSIS — N186 End stage renal disease: Secondary | ICD-10-CM | POA: Diagnosis not present

## 2020-05-18 DIAGNOSIS — N2581 Secondary hyperparathyroidism of renal origin: Secondary | ICD-10-CM | POA: Diagnosis not present

## 2020-05-18 DIAGNOSIS — D689 Coagulation defect, unspecified: Secondary | ICD-10-CM | POA: Diagnosis not present

## 2020-05-18 DIAGNOSIS — Z23 Encounter for immunization: Secondary | ICD-10-CM | POA: Diagnosis not present

## 2020-05-18 DIAGNOSIS — Z992 Dependence on renal dialysis: Secondary | ICD-10-CM | POA: Diagnosis not present

## 2020-05-18 NOTE — Progress Notes (Signed)
[name], [name] ([id number]) Visit Report for 05/17/2020 Arrival Information Details Patient Name: Date of Service: [name] [name], [name] 05/17/2020 12:45 PM Medical Record Number: [id number] Patient Account Number: [id number] Date of Birth/Sex: Treating RN: 04-Oct-1950 (69 y.o. [name] Primary Care [name]: [name] Other Clinician: Referring Stepahnie [name]: Treating [name]/Extender: [name] in Treatment: 14 Visit Information History Since Last Visit All ordered tests and consults were completed: Yes Patient Arrived: Wheel Chair Added or deleted any medications: No Arrival Time: 12:49 Any new allergies or adverse reactions: No Accompanied By: self Had a fall or experienced change in No Transfer Assistance: None activities of daily living that may affect Patient Identification Verified: Yes risk of falls: Secondary Verification Process Completed: Yes Signs or symptoms of abuse/neglect since last visito No Patient Requires Transmission-Based Precautions: No Hospitalized since last visit: No Patient Has Alerts: Yes Implantable device outside of the clinic excluding No Patient Alerts: Right ABI: [place] cellular tissue based products placed in the center Left ABI: [place] since last visit: Has Dressing in Place as Prescribed: Yes Has Compression in Place as Prescribed: No Pain Present Now: No Notes compression wrap removed yesterday for venous studies Electronic Signature(s) Signed: 05/17/2020 5:36:04 PM By: [name] RN, BSN Entered By: [name] on 05/17/2020 12:50:34 -------------------------------------------------------------------------------- Compression Therapy Details Patient Name: Date of Service: [name], [place] [name] 05/17/2020 12:45 PM Medical Record Number: [id number] Patient Account Number: [id number] Date of Birth/Sex: Treating RN: 06/08/1951 (68 y.o. [name] Primary Care Cleve Paolillo: [name] Other  Clinician: Referring Jasneet Schobert: Treating Jet Traynham/Extender: [name] in Treatment: 14 Compression Therapy Performed for Wound Assessment: Wound #16 Right,Medial Lower Leg Performed By: Clinician [name] Pilling, RN Compression Type: Three Layer Post Procedure Diagnosis Same as Pre-procedure Electronic Signature(s) Signed: 05/17/2020 5:36:04 PM By: [name] RN, BSN Entered By: [name] on 05/17/2020 13:01:57 -------------------------------------------------------------------------------- Encounter Discharge Information Details Patient Name: Date of Service: [facility] [name], [place] [name] 05/17/2020 12:45 PM Medical Record Number: [id number] Patient Account Number: [id number] Date of Birth/Sex: Treating RN: 26-Oct-1950 (69 y.o. [name] Primary Care [name]: [name] Other Clinician: Referring [name]: Treating [name]/Extender: [name] in Treatment: 14 Encounter Discharge Information Items Post Procedure Vitals Discharge Condition: Stable Temperature (F): 98.5 Ambulatory Status: Wheelchair Pulse (bpm): 101 Discharge Destination: Home Respiratory Rate (breaths/min): 18 Transportation: Private Auto Blood Pressure (mmHg): 103/71 Accompanied By: self Schedule Follow-up Appointment: Yes Clinical Summary of Care: Electronic Signature(s) Signed: 05/18/2020 5:24:43 PM By: [name] Entered By: [name] on 05/17/2020 16:34:25 -------------------------------------------------------------------------------- Lower Extremity Assessment Details Patient Name: Date of Service: [facility] [name], [name] 05/17/2020 12:45 PM Medical Record Number: [id number] Patient Account Number: [id number] Date of Birth/Sex: Treating RN: [birth year] (69 y.o. [name] Primary Care [name]: [name] Other Clinician: Referring September Mormile: Treating [name]/Extender: [name] in  Treatment: 14 Edema Assessment Assessed: [Left: No] [Right: No] Edema: [Left: No] [Right: No] Calf Left: Right: Point of Measurement: 38 cm From Medial Instep 35.5 cm 34.6 cm Ankle Left: Right: Point of Measurement: 13 cm From Medial Instep 23.7 cm 24 cm Vascular Assessment Pulses: Dorsalis Pedis Palpable: [Left:Yes] [Right:Yes] Electronic Signature(s) Signed: 05/17/2020 5:36:04 PM By: [name] RN, BSN Entered By: [name] on 05/17/2020 12:54:11 -------------------------------------------------------------------------------- Multi-Disciplinary Care Plan Details Patient Name: Date of Service: [facility] [name], [place] [name] 05/17/2020 12:45 PM Medical Record Number: [id number] Patient Account Number: [id number] Date of Birth/Sex: Treating RN:  03/24/1951 (69 y.o. [name] Primary Care [name]: [name] Other Clinician: Referring [name]: Treating [name]/Extender: [name] in Treatment: 14 Active Inactive Venous Leg Ulcer Nursing Diagnoses: Knowledge deficit related to disease process and management Potential for venous Insuffiency (use before diagnosis confirmed) Goals: Patient will maintain optimal edema control Date Initiated: 02/09/2020 Target Resolution Date: 06/07/2020 Goal Status: Active Patient/caregiver will verbalize understanding of disease process and disease management Date Initiated: 02/09/2020 Date Inactivated: 03/02/2020 Target Resolution Date: 03/08/2020 Goal Status: Met Interventions: Assess peripheral edema status every visit. Compression as ordered Provide education on venous insufficiency Treatment Activities: Therapeutic compression applied : 02/09/2020 Notes: Wound/Skin Impairment Nursing Diagnoses: Impaired tissue integrity Knowledge deficit related to ulceration/compromised skin integrity Goals: Patient/caregiver will verbalize understanding of skin care regimen Date Initiated:  02/09/2020 Target Resolution Date: 06/07/2020 Goal Status: Active Ulcer/skin breakdown will have a volume reduction of 30% by week 4 Date Initiated: 02/09/2020 Date Inactivated: 03/02/2020 Target Resolution Date: 03/08/2020 Goal Status: Met Interventions: Assess patient/caregiver ability to obtain necessary supplies Assess patient/caregiver ability to perform ulcer/skin care regimen upon admission and as needed Assess ulceration(s) every visit Provide education on ulcer and skin care Treatment Activities: Skin care regimen initiated : 02/09/2020 Topical wound management initiated : 02/09/2020 Notes: Electronic Signature(s) Signed: 05/17/2020 5:36:04 PM By: [name] RN, BSN Entered By: [name] on 05/17/2020 12:56:23 -------------------------------------------------------------------------------- Pain Assessment Details Patient Name: Date of Service: [name], [place] [employer] [name] 05/17/2020 12:45 PM Medical Record Number: [id number] Patient Account Number: [id number] Date of Birth/Sex: Treating RN: 08/14/1950 (69 y.o. [name] Primary Care Avonlea Sima: [name] Other Clinician: Referring Shaquille Murdy: Treating Nikai Quest/Extender: [name] in Treatment: 14 Active Problems Location of Pain Severity and Description of Pain Patient Has Paino No Site Locations Rate the pain. Current Pain Level: 0 Pain Management and Medication Current Pain Management: Electronic Signature(s) Signed: 05/17/2020 5:36:04 PM By: [name] RN, BSN Entered By: [name] on 05/17/2020 12:52:37 -------------------------------------------------------------------------------- Patient/Caregiver Education Details Patient Name: Date of Service: [facility] [name], [place] [name] 10/27/2021andnbsp12:45 PM Medical Record Number: [id number] Patient Account Number: [id number] Date of Birth/Gender: Treating RN: 10-Jun-1951 (69 y.o. [name] Primary Care Physician:  [name] Other Clinician: Referring Physician: Treating Physician/Extender: [name] in Treatment: 14 Education Assessment Education Provided To: Patient Education Topics Provided Venous: Methods: Explain/Verbal Responses: Reinforcements needed, State content correctly Wound/Skin Impairment: Methods: Explain/Verbal Responses: Reinforcements needed, State content correctly Electronic Signature(s) Signed: 05/17/2020 5:36:04 PM By: [name] RN, BSN Entered By: [name] on 05/17/2020 12:56:45 -------------------------------------------------------------------------------- Wound Assessment Details Patient Name: Date of Service: [name], [place] [name] 05/17/2020 12:45 PM Medical Record Number: [id number] Patient Account Number: [id number] Date of Birth/Sex: Treating RN: Nov 14, 1950 (69 y.o. [name] Primary Care Davione Lenker: [name] Other Clinician: Referring Yuvonne Lanahan: Treating Tzion Wedel/Extender: [name] in Treatment: 14 Wound Status Wound Number: 16 Primary Venous Leg Ulcer Etiology: Wound Location: Right, Medial Lower Leg Wound Open Wounding Event: Gradually Appeared Status: Date Acquired: 05/10/2020 Comorbid Cataracts, Anemia, Asthma, Hypertension, Type II Diabetes, End Weeks Of Treatment: 1 History: Stage Renal Disease, Osteomyelitis, Neuropathy Clustered Wound: No Wound Measurements Length: (cm) Width: (cm) Depth: (cm) Area: (cm) Volume: (cm) 0 % Reduction in Area: 100% 0 % Reduction in Volume: 100% 0 Epithelialization: Large (67-100%) 0 Tunneling: No 0 Undermining: No Wound Description Classification: Full Thickness Without Exposed Support Structures Wound Margin: Flat and Intact Exudate Amount: None Present Foul Odor  After Cleansing: No Slough/Fibrino No Wound Bed Granulation Amount: None Present (0%) Exposed Structure Necrotic Amount: None Present (0%) Fascia  Exposed: No Fat Layer (Subcutaneous Tissue) Exposed: No Tendon Exposed: No Muscle Exposed: No Joint Exposed: No Bone Exposed: No Electronic Signature(s) Signed: 05/17/2020 5:36:04 PM By: [name] RN, BSN Entered By: [name] on 05/17/2020 12:54:53 -------------------------------------------------------------------------------- Wound Assessment Details Patient Name: Date of Service: [name], [place] [name] 05/17/2020 12:45 PM Medical Record Number: [id number] Patient Account Number: [id number] Date of Birth/Sex: Treating RN: 1951-05-30 (69 y.o. [name] Primary Care Shakeya Kerkman: [name] Other Clinician: Referring Donyel Nester: Treating Gary Bultman/Extender: [name] in Treatment: 14 Wound Status Wound Number: 8 Primary Diabetic Wound/Ulcer of the Lower Extremity Etiology: Wound Location: Left T Third oe Wound Open Wounding Event: Blister Status: Date Acquired: 02/23/2020 Comorbid Cataracts, Anemia, Asthma, Hypertension, Type II Diabetes, End Weeks Of Treatment: 12 History: Stage Renal Disease, Osteomyelitis, Neuropathy Clustered Wound: No Wound Measurements Length: (cm) 0.4 Width: (cm) 0.6 Depth: (cm) 0.1 Area: (cm) 0.188 Volume: (cm) 0.019 % Reduction in Area: -33.3% % Reduction in Volume: -35.7% Epithelialization: Small (1-33%) Tunneling: No Undermining: No Wound Description Classification: Grade 2 Wound Margin: Flat and Intact Exudate Amount: None Present Foul Odor After Cleansing: No Slough/Fibrino No Wound Bed Granulation Amount: None Present (0%) Exposed Structure Necrotic Amount: Large (67-100%) Fascia Exposed: No Necrotic Quality: Adherent Slough Fat Layer (Subcutaneous Tissue) Exposed: Yes Tendon Exposed: No Muscle Exposed: No Joint Exposed: No Bone Exposed: No Assessment Notes scabbed Treatment Notes Wound #8 (Left Toe Third) 1. Cleanse With Wound Cleanser 3. Primary Dressing Applied Santyl 4.  Secondary Dressing Dry Gauze Roll Gauze 5. Secured With Medipore tape Notes netting. Electronic Signature(s) Signed: 05/17/2020 5:36:04 PM By: [name] RN, BSN Entered By: [name] on 05/17/2020 12:55:54 -------------------------------------------------------------------------------- Vitals Details Patient Name: Date of Service: [name], [place] [name] 05/17/2020 12:45 PM Medical Record Number: [id number] Patient Account Number: [id number] Date of Birth/Sex: Treating RN: 04-Jul-1951 (69 y.o. [name] Primary Care [place]: [name] Other Clinician: Referring Andrej Spagnoli: Treating Elis Sauber/Extender: [name] in Treatment: 14 Vital Signs Time Taken: 12:50 Temperature (F): 98.5 Height (in): 67 Pulse (bpm): 101 Source: Stated Respiratory Rate (breaths/min): 18 Weight (lbs): 202 Blood Pressure (mmHg): 103/71 Source: Stated Capillary Blood Glucose (mg/dl): 121 Body Mass Index (BMI): 31.6 Reference Range: 80 - 120 mg / dl Notes glucose per pt report this am Electronic Signature(s) Signed: 05/17/2020 5:36:04 PM By: [name] RN, BSN Entered By: [name] on 05/17/2020 12:52:30

## 2020-05-19 ENCOUNTER — Emergency Department (HOSPITAL_COMMUNITY)
Admission: EM | Admit: 2020-05-19 | Discharge: 2020-05-19 | Disposition: A | Discharge status: Home / Self Care | Payer: Medicare Other | Attending: Emergency Medicine | Admitting: Emergency Medicine

## 2020-05-19 ENCOUNTER — Emergency Department (HOSPITAL_COMMUNITY): Payer: Medicare Other

## 2020-05-19 ENCOUNTER — Ambulatory Visit (INDEPENDENT_AMBULATORY_CARE_PROVIDER_SITE_OTHER): Payer: Medicare Other | Admitting: Family Medicine

## 2020-05-19 ENCOUNTER — Other Ambulatory Visit: Payer: Self-pay

## 2020-05-19 VITALS — BP 108/60 | HR 113 | Temp 98.0°F | Ht 67.0 in | Wt 198.0 lb

## 2020-05-19 DIAGNOSIS — R109 Unspecified abdominal pain: Secondary | ICD-10-CM

## 2020-05-19 DIAGNOSIS — Z853 Personal history of malignant neoplasm of breast: Secondary | ICD-10-CM | POA: Diagnosis not present

## 2020-05-19 DIAGNOSIS — Z992 Dependence on renal dialysis: Secondary | ICD-10-CM | POA: Diagnosis not present

## 2020-05-19 DIAGNOSIS — N186 End stage renal disease: Secondary | ICD-10-CM | POA: Diagnosis not present

## 2020-05-19 DIAGNOSIS — Z794 Long term (current) use of insulin: Secondary | ICD-10-CM | POA: Insufficient documentation

## 2020-05-19 DIAGNOSIS — I129 Hypertensive chronic kidney disease with stage 1 through stage 4 chronic kidney disease, or unspecified chronic kidney disease: Secondary | ICD-10-CM | POA: Diagnosis not present

## 2020-05-19 DIAGNOSIS — L97519 Non-pressure chronic ulcer of other part of right foot with unspecified severity: Secondary | ICD-10-CM | POA: Insufficient documentation

## 2020-05-19 DIAGNOSIS — E1140 Type 2 diabetes mellitus with diabetic neuropathy, unspecified: Secondary | ICD-10-CM | POA: Diagnosis not present

## 2020-05-19 DIAGNOSIS — J45909 Unspecified asthma, uncomplicated: Secondary | ICD-10-CM | POA: Insufficient documentation

## 2020-05-19 DIAGNOSIS — R000 Tachycardia, unspecified: Secondary | ICD-10-CM | POA: Insufficient documentation

## 2020-05-19 DIAGNOSIS — R10811 Right upper quadrant abdominal tenderness: Secondary | ICD-10-CM | POA: Diagnosis not present

## 2020-05-19 DIAGNOSIS — N189 Chronic kidney disease, unspecified: Secondary | ICD-10-CM | POA: Insufficient documentation

## 2020-05-19 DIAGNOSIS — R1031 Right lower quadrant pain: Secondary | ICD-10-CM | POA: Diagnosis not present

## 2020-05-19 DIAGNOSIS — Z85528 Personal history of other malignant neoplasm of kidney: Secondary | ICD-10-CM | POA: Diagnosis not present

## 2020-05-19 DIAGNOSIS — E113293 Type 2 diabetes mellitus with mild nonproliferative diabetic retinopathy without macular edema, bilateral: Secondary | ICD-10-CM | POA: Diagnosis not present

## 2020-05-19 DIAGNOSIS — E11621 Type 2 diabetes mellitus with foot ulcer: Secondary | ICD-10-CM | POA: Insufficient documentation

## 2020-05-19 DIAGNOSIS — E1129 Type 2 diabetes mellitus with other diabetic kidney complication: Secondary | ICD-10-CM | POA: Diagnosis not present

## 2020-05-19 DIAGNOSIS — R1011 Right upper quadrant pain: Secondary | ICD-10-CM | POA: Diagnosis not present

## 2020-05-19 LAB — COMPREHENSIVE METABOLIC PANEL
ALT: 11 U/L (ref 0–44)
AST: 16 U/L (ref 15–41)
Albumin: 3.6 g/dL (ref 3.5–5.0)
Alkaline Phosphatase: 53 U/L (ref 38–126)
Anion gap: 19 — ABNORMAL HIGH (ref 5–15)
BUN: 34 mg/dL — ABNORMAL HIGH (ref 8–23)
CO2: 29 mmol/L (ref 22–32)
Calcium: 9.3 mg/dL (ref 8.9–10.3)
Chloride: 92 mmol/L — ABNORMAL LOW (ref 98–111)
Creatinine, Ser: 5.63 mg/dL — ABNORMAL HIGH (ref 0.44–1.00)
GFR, Estimated: 8 mL/min — ABNORMAL LOW (ref >60)
Glucose, Bld: 154 mg/dL — ABNORMAL HIGH (ref 70–99)
Potassium: 4.4 mmol/L (ref 3.5–5.1)
Sodium: 140 mmol/L (ref 135–145)
Total Bilirubin: 1.1 mg/dL (ref 0.3–1.2)
Total Protein: 7.8 g/dL (ref 6.5–8.1)

## 2020-05-19 LAB — CBC
HCT: 34.5 % — ABNORMAL LOW (ref 36.0–46.0)
Hemoglobin: 11.4 g/dL — ABNORMAL LOW (ref 12.0–15.0)
MCH: 32.5 pg (ref 26.0–34.0)
MCHC: 33 g/dL (ref 30.0–36.0)
MCV: 98.3 fL (ref 80.0–100.0)
Platelets: 193 10*3/uL (ref 150–400)
RBC: 3.51 MIL/uL — ABNORMAL LOW (ref 3.87–5.11)
RDW: 15.6 % — ABNORMAL HIGH (ref 11.5–15.5)
WBC: 10.7 10*3/uL — ABNORMAL HIGH (ref 4.0–10.5)
nRBC: 0 % (ref 0.0–0.2)

## 2020-05-19 LAB — LIPASE, BLOOD: Lipase: 28 U/L (ref 11–51)

## 2020-05-19 MED ORDER — SODIUM CHLORIDE 0.9 % IV BOLUS
500.0000 mL | Freq: Once | INTRAVENOUS | Status: AC
  Administered 2020-05-19: 500 mL via INTRAVENOUS

## 2020-05-19 MED ORDER — CEFUROXIME AXETIL 500 MG PO TABS: 500.0000 mg | ORAL_TABLET | Freq: Every day | ORAL | 0 refills | Status: AC

## 2020-05-19 MED ORDER — IOHEXOL 300 MG/ML  SOLN
100.0000 mL | Freq: Once | INTRAMUSCULAR | Status: AC | PRN
  Administered 2020-05-19: 100 mL via INTRAVENOUS

## 2020-05-19 MED ORDER — METRONIDAZOLE 500 MG PO TABS: 500.0000 mg | ORAL_TABLET | Freq: Two times a day (BID) | ORAL | 0 refills | Status: DC

## 2020-05-19 NOTE — ED Triage Notes (Signed)
RLQ abd pain x2 days, went to PCP earlier and they recommended coming to ED to rule out appendicitis. Endorses nausea and emesis yesterday while at dialysis, reports no prior episodes of this and has been on dialysis x1 year. Last BM was yesterday, normal per pt.

## 2020-05-19 NOTE — Discharge Instructions (Addendum)
Your CT scan shows you possibly have diverticulitis possibly a tumor in your right lower quadrant..  You also have a lot of stool.  You should take MiraLAX and increase your water intake daily.  2 Antibiotics sent to your pharmacy.  Please take them as prescribed.  Do not drink alcohol while taking these medications as it will make you have terrible vomiting.  You need to follow-up with GI.  I have given you the number for [facility].  Call their office tomorrow to schedule the next available appointment.  You also need to follow-up with your primary care doctor as soon as possible to discuss these results from today.  Your doctor will be able to see all the records in the computer.

## 2020-05-19 NOTE — ED Provider Notes (Signed)
Medical screening examination/treatment/procedure(s) were conducted as a shared visit with non-physician practitioner(s) and myself.  I personally evaluated the patient during the encounter.     Patient seen by me along with physician assistant.  Patient with about 4 to 5 days of some mild right lower quadrant abdominal pain.  Patient is a dialysis patient.  Normally dialyzes Tuesday Thursdays and Saturdays.  Due for dialysis tomorrow.  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  Work-up for the abdominal pain showed a mild leukocytosis with a white blood cell count of 10.7.  CT scan raise concerns for inflammatory process in the cecum ascending colon.  Could be diverticulitis.  Will treat as such.  But also some concern for neoplastic process.  Patient understands the concerns and follow-up with gastroenterology will be important.  Patient nontoxic no acute distress.  Patient stable for discharge home.  Patient with some mild tenderness to palpation right lower quadrant.   Fredia Sorrow, MD 05/19/20 1610

## 2020-05-19 NOTE — Progress Notes (Signed)
Subjective:    Patient ID: [name], female    DOB: [birth year], 69 y.o.   MRN: [id number]  Medication Refill Associated symptoms include abdominal pain.  Abdominal Pain  Patient has a history of stage V chronic kidney disease after having a resection of a renal cell carcinoma.  She is now on dialysis.  She also has IDDM.  She occasionally makes urine.  She denies any dysuria or hematuria.  She denies any vaginal discharge.  Beginning yesterday she developed a constant pain in the right lower quadrant.  The pain is getting more intense today.  On examination today, the patient moans when I press in the right lower quadrant at McBurney's point.  She also has guarding in that area with some rebound.  She has diminished bowel sounds throughout.  She denies any constipation.  She denies any nausea or vomiting.  She denies any fever.  She has been having regular bowel movements every day including yesterday.  She denies any melena or hematochezia. Past Medical History:  Diagnosis Date  . Anemia   . Anemia associated with chronic renal failure   . Anemia in chronic kidney disease 09/29/2018  . Arthritis   . Asthma   . Blood transfusion without reported diagnosis    Phreesia 02/27/2020  . Cancer ([place])    Phreesia 02/27/2020  . Cataract    OD  . Chronic kidney disease    Phreesia 02/27/2020  . Coronary artery calcification seen on CAT scan 02/17/2018   Coronary calcification on CT  . Depression   . Diabetes mellitus without complication ([place])    Phreesia 02/27/2020  . Diabetic retinopathy of both eyes ([place])   . Diabetic ulcer of right foot associated with type 2 diabetes mellitus ([place])   . ESRD (end stage renal disease) on dialysis ([place])   . Essential hypertension 02/17/2018   Essential hypertension  . GERD (gastroesophageal reflux disease)    pt denies  . HLD (hyperlipidemia)   . Hypertension   . Hypertensive retinopathy    OU  . Left ventricular dysfunction 04/07/2018   Left  ventricle dysfunction  . Microalbuminuria due to type 2 diabetes mellitus ([place])   . Neuromuscular disorder ([place])    diabetic neuropathy  . Nonproliferative retinopathy due to secondary diabetes ([place])   . Nonsustained ventricular tachycardia ([place]) 08/28/2018   Nonsustained ventricular tachycardia  . Pneumonia    hx of   . Renal cell cancer ([place])   . Right renal mass 03/10/2017  . Type 2 diabetes, controlled, with neuropathy ([place]) 06/22/2013  . Ulcer of other part of foot 06/22/2013  . 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 AMPUTATION TOE Right 10/29/2019   Procedure: RIGHT 2ND TOE AMPUTATION;  Surgeon: Edrick Kins, DPM;  Location: [facility];  Service: Podiatry;  Laterality: Right;  . BASCILIC VEIN TRANSPOSITION Left 08/08/2017   Procedure: BASILIC VEIN TRANSPOSITION FIRST STAGE LEFT ARM;  Surgeon: Serafina Mitchell, MD;  Location: [place];  Service: Vascular;  Laterality: Left;  . BASCILIC VEIN TRANSPOSITION Left 05/14/2018   Procedure: SECOND STAGE BASILIC VEIN TRANSPOSITION LEFT ARM;  Surgeon: Serafina Mitchell, MD;  Location: [place];  Service: Vascular;  Laterality: Left;  . CATARACT EXTRACTION Left   . EYE SURGERY    . ROBOTIC ADRENALECTOMY Left 03/10/2017   Procedure: XI ROBOTIC ADRENALECTOMY;  Surgeon: Nickie Retort, MD;  Location: [facility];  Service: Urology;  Laterality: Left;  . ROBOTIC ASSITED PARTIAL NEPHRECTOMY Right 03/10/2017   Procedure: XI ROBOTIC ASSITED RADICAL NEPHRECTOMY;  Surgeon: Nickie Retort, MD;  Location: [facility];  Service: Urology;  Laterality: Right;  . TOE AMPUTATION Bilateral    both great toe ,, left foot 2nd toe 1/2   Current Outpatient Medications on File Prior to Visit  Medication Sig Dispense Refill  . Blood Glucose Monitoring Suppl (ONE TOUCH ULTRA 2) w/Device KIT Use to check BS BID-QID Dx:E11.9 1 each 1  . BREO ELLIPTA 100-25 MCG/INH AEPB Inhale 1 puff by mouth once daily  (Patient taking differently: Inhale 1 puff into the lungs daily. ) 180 each 2  . Carboxymethylcellulose Sodium (THERATEARS OP) Place 1 drop into both eyes daily as needed (dry eyes).    . diphenhydrAMINE (BENADRYL) 25 MG tablet Take 25 mg by mouth daily as needed for allergies.    Marland Kitchen gabapentin (NEURONTIN) 300 MG capsule TAKE 1 CAPSULE BY MOUTH THREE TIMES DAILY. REQUIRES OFFICE VISIT BEFORE ANY FURTHER REFILLS CAN BE GIVEN 90 capsule 0  . insulin degludec (TRESIBA FLEXTOUCH) 100 UNIT/ML FlexTouch Pen Inject 0.3 mLs (30 Units total) into the skin daily. Hold if fasting blood sugars are <130. 45 mL 0  . Insulin Pen Needle (LIVE BETTER PEN NEEDLES) 31G X 6 MM MISC To use with Tresiba pens daily 100 each 3  . Lancets (ONETOUCH ULTRASOFT) lancets Use as instructed 300 each 3  . multivitamin (RENA-VIT) TABS tablet Take 1 tablet by mouth daily.    Glory Rosebush ULTRA test strip USE AS DIRECTED TO MONITOR  FSBS 3 TIMES DAILY 300 strip 3  . PROAIR HFA 108 (90 Base) MCG/ACT inhaler INHALE 2 PUFFS BY MOUTH EVERY 6 HOURS AS NEEDED FOR WHEEZING FOR SHORTNESS OF BREATH 9 g 0  . rosuvastatin (CRESTOR) 20 MG tablet Take 1 tablet (20 mg total) by mouth daily. 90 tablet 3  . bisacodyl (BISACODYL) 5 MG EC tablet Take 10 mg by mouth daily as needed for moderate constipation.  (Patient not taking: Reported on 05/19/2020)    . lidocaine-prilocaine (EMLA) cream Apply 1 application topically as needed (port access). (Patient not taking: Reported on 05/19/2020)    . Methoxy PEG-Epoetin Beta (MIRCERA IJ) Mircera (Patient not taking: Reported on 05/19/2020)    . oxyCODONE-acetaminophen (PERCOCET) 5-325 MG tablet Take 1 tablet by mouth every 6 (six) hours as needed for severe pain. (Patient not taking: Reported on 03/01/2020) 30 tablet 0  . sevelamer carbonate (RENVELA) 800 MG tablet Take 800 mg by mouth 2 (two) times daily with a meal. (Patient not taking: Reported on 05/19/2020)     Current Facility-Administered Medications on  File Prior to Visit  Medication Dose Route Frequency Provider Last Rate Last Admin  . Bevacizumab (AVASTIN) SOLN 1.25 mg  1.25 mg Intravitreal  [name], MD   1.25 mg at 05/12/18 1319  . Bevacizumab (AVASTIN) SOLN 1.25 mg  1.25 mg Intravitreal  [name], MD   1.25 mg at 06/13/18 0055  . Bevacizumab (AVASTIN) SOLN 1.25 mg  1.25 mg Intravitreal  [name], MD   1.25 mg at 07/10/18 1308  . Bevacizumab (AVASTIN) SOLN 1.25 mg  1.25 mg Intravitreal  [name], MD   1.25 mg at 08/21/18 1312  . Bevacizumab (AVASTIN) SOLN 1.25 mg  1.25 mg Intravitreal  [name], MD   1.25 mg at 10/02/18 1510   Allergies  Allergen Reactions  . Penicillins Other (See Comments)    UNSPECIFIED CHILDHOOD REACTION  Has patient had a PCN reaction causing immediate rash, facial/tongue/throat swelling, SOB or lightheadedness with hypotension: Unknown Has patient had a PCN reaction causing severe rash involving mucus membranes or skin necrosis: Unknown Has patient had a PCN reaction that required hospitalization: Unknown Has patient had a PCN reaction occurring within the last 10 years: Unknown If all of the above answers are "NO", then may proceed with Cephalosporin use.   . Adhesive [Tape] Rash   Social History   Socioeconomic History  . Marital status: Single    Spouse name: Not on file  . Number of children: 2  . Years of education: 59  . Highest education level: Not on file  Occupational History  . Not on file  Tobacco Use  . Smoking status: Never Smoker  . Smokeless tobacco: Never Used  Vaping Use  . Vaping Use: Never used  Substance and Sexual Activity  . Alcohol use: Yes    Alcohol/week: 0.0 standard drinks    Comment: occ  . Drug use: No  . Sexual activity: Not Currently    Partners: Male  Other Topics Concern  . Not on file  Social History Narrative  . Not on file   Social Determinants of Health   Financial Resource Strain: Low Risk   . Difficulty of Paying Living  Expenses: Not very hard  Food Insecurity: No Food Insecurity  . Worried About Charity fundraiser in the Last Year: Never true  . Ran Out of Food in the Last Year: Never true  Transportation Needs: No Transportation Needs  . Lack of Transportation (Medical): No  . Lack of Transportation (Non-Medical): No  Physical Activity:   . Days of Exercise per Week: Not on file  . Minutes of Exercise per Session: Not on file  Stress:   . Feeling of Stress : Not on file  Social Connections:   . Frequency of Communication with Friends and Family: Not on file  . Frequency of Social Gatherings with Friends and Family: Not on file  . Attends Religious Services: Not on file  . Active Member of Clubs or Organizations: Not on file  . Attends Archivist Meetings: Not on file  . Marital Status: Not on file  Intimate Partner Violence:   . Fear of Current or Ex-Partner: Not on file  . Emotionally Abused: Not on file  . Physically Abused: Not on file  . Sexually Abused: Not on file     Review of Systems  Gastrointestinal: Positive for abdominal pain.  All other systems reviewed and are negative.      Objective:   Physical Exam Vitals reviewed.  Constitutional:      General: She is not in acute distress.    Appearance: She is well-developed. She is not diaphoretic.  Cardiovascular:     Rate and Rhythm: Normal rate and regular rhythm.     Heart sounds: Normal heart sounds.  Pulmonary:     Effort: Pulmonary effort is normal. No respiratory distress.     Breath sounds: Normal breath sounds. No wheezing or rales.  Abdominal:     General: Bowel sounds are decreased. There is no distension.     Palpations: Abdomen is soft.     Tenderness: There is abdominal tenderness in the right lower quadrant. There is no guarding or rebound.             Assessment & Plan:    RLQ abdominal pain  Given the intensity of the pain in the  location I am concerned about early appendicitis.   Recommended a CT scan through the emergency room to evaluate further.  Other potential causes on the differential diagnosis would be nephrolithiasis versus an atypical presentation for diverticulitis versus severe constipation however given the location and the presentation I am concerned about appendicitis.  Patient elects to go to [facility] emergency room

## 2020-05-19 NOTE — ED Provider Notes (Signed)
[name] DEPT Provider Note   CSN: [id number] Arrival date & time: 05/19/20  1214     History Chief Complaint  Patient presents with  . Abdominal Pain    [name] is a 69 y.o. female with hospital history significant for anemia, chronic kidney disease, diabetes, ESRD on dialysis, left ventricular dysfunction, renal cell cancer s/p right nephrectomy. Dialysis T/TH/S at [facility].  HPI Patient presents to emergency department today with chief complaint of progressively worsening right lower quadrant abdominal pain x2 days.  She describes the pain as an aching soreness.  Pain does not radiate.  She rates the pain 5 out of 10 in severity.  She was seen by her PCP earlier today and is recommended she come to the emergency department for further evaluation. There is concern for appendicitis. Patient states her last bowel movement was yesterday. She occasionally makes urine.  She does also endorse urinary frequency and dysuria since symptom onset.  She gave a urine sample yesterday however does not have results yet.  She had an episode of nausea and emesis while at dialysis yesterday as well, none today. Patient states she takes gabapentin for her diabetic neuropathy.  She took it yesterday and thought that it helped her abdominal pain.  She has had decreased p.o. intake since symptom onset because she has been feeling poorly.  Denies any weight loss, fatigue, fever, chills, cough, chest pain, gross hematuria, bloody stool, back pain, pelvic pain, vaginal discharge, abnormal vaginal bleeding.       Past Medical History:  Diagnosis Date  . Anemia   . Anemia associated with chronic renal failure   . Anemia in chronic kidney disease 09/29/2018  . Arthritis   . Asthma   . Blood transfusion without reported diagnosis    Phreesia 02/27/2020  . Cancer ([place])    Phreesia 02/27/2020  . Cataract    OD  . Chronic kidney disease    Phreesia 02/27/2020  .  Coronary artery calcification seen on CAT scan 02/17/2018   Coronary calcification on CT  . Depression   . Diabetes mellitus without complication ([place])    Phreesia 02/27/2020  . Diabetic retinopathy of both eyes ([place])   . Diabetic ulcer of right foot associated with type 2 diabetes mellitus ([place])   . ESRD (end stage renal disease) on dialysis ([place])   . Essential hypertension 02/17/2018   Essential hypertension  . GERD (gastroesophageal reflux disease)    pt denies  . HLD (hyperlipidemia)   . Hypertension   . Hypertensive retinopathy    OU  . Left ventricular dysfunction 04/07/2018   Left ventricle dysfunction  . Microalbuminuria due to type 2 diabetes mellitus ([place])   . Neuromuscular disorder ([place])    diabetic neuropathy  . Nonproliferative retinopathy due to secondary diabetes ([place])   . Nonsustained ventricular tachycardia ([place]) 08/28/2018   Nonsustained ventricular tachycardia  . Pneumonia    hx of   . Renal cell cancer ([place])   . Right renal mass 03/10/2017  . Type 2 diabetes, controlled, with neuropathy ([place]) 06/22/2013  . Ulcer of other part of foot 06/22/2013  . 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 Anemia in chronic kidney disease 09/29/2018  . Nonsustained ventricular tachycardia ([place]) 08/28/2018  . Nonproliferative retinopathy due to secondary diabetes ([place])   . Left ventricular dysfunction 04/07/2018  . Essential hypertension 02/17/2018  . Coronary  artery calcification seen on CAT scan 02/17/2018  . Diabetic retinopathy of both eyes ([place])   . Hyperlipidemia 10/01/2017  . Encounter for long-term (current) use of medications 10/01/2017  . Right renal mass 03/10/2017  . Diabetic ulcer of right foot associated with type 2 diabetes mellitus ([place])   . Type 2 diabetes, controlled, with neuropathy ([place]) 06/22/2013  . Ulcer of other part of foot 06/22/2013    Past Surgical History:  Procedure  Laterality Date  . AMPUTATION TOE Right 10/29/2019   Procedure: RIGHT 2ND TOE AMPUTATION;  Surgeon: Edrick Kins, DPM;  Location: [facility];  Service: Podiatry;  Laterality: Right;  . BASCILIC VEIN TRANSPOSITION Left 08/08/2017   Procedure: BASILIC VEIN TRANSPOSITION FIRST STAGE LEFT ARM;  Surgeon: Serafina Mitchell, MD;  Location: [place];  Service: Vascular;  Laterality: Left;  . BASCILIC VEIN TRANSPOSITION Left 05/14/2018   Procedure: SECOND STAGE BASILIC VEIN TRANSPOSITION LEFT ARM;  Surgeon: Serafina Mitchell, MD;  Location: [place];  Service: Vascular;  Laterality: Left;  . CATARACT EXTRACTION Left   . EYE SURGERY    . ROBOTIC ADRENALECTOMY Left 03/10/2017   Procedure: XI ROBOTIC ADRENALECTOMY;  Surgeon: Nickie Retort, MD;  Location: [facility];  Service: Urology;  Laterality: Left;  . ROBOTIC ASSITED PARTIAL NEPHRECTOMY Right 03/10/2017   Procedure: XI ROBOTIC ASSITED RADICAL NEPHRECTOMY;  Surgeon: Nickie Retort, MD;  Location: [facility];  Service: Urology;  Laterality: Right;  . TOE AMPUTATION Bilateral    both great toe ,, left foot 2nd toe 1/2     OB History   No obstetric history on file.     Family History  Problem Relation Age of Onset  . Heart disease Mother   . Diabetes Sister     Social History   Tobacco Use  . Smoking status: Never Smoker  . Smokeless tobacco: Never Used  Vaping Use  . Vaping Use: Never used  Substance Use Topics  . Alcohol use: Yes    Alcohol/week: 0.0 standard drinks    Comment: occ  . Drug use: No    Home Medications Prior to Admission medications   Medication Sig Start Date End Date Taking? Authorizing Provider  BREO ELLIPTA 100-25 MCG/INH AEPB Inhale 1 puff by mouth once daily Patient taking differently: Inhale 1 puff into the lungs daily.  05/21/19  Yes [name], MD  Carboxymethylcellulose Sodium (THERATEARS OP) Place 1 drop into both eyes daily as needed (dry eyes).   Yes [provider]  diphenhydrAMINE (BENADRYL) 25  MG tablet Take 25 mg by mouth daily as needed for allergies.   Yes [provider]  gabapentin (NEURONTIN) 300 MG capsule TAKE 1 CAPSULE BY MOUTH THREE TIMES DAILY. REQUIRES OFFICE VISIT BEFORE ANY FURTHER REFILLS CAN BE GIVEN Patient taking differently: Take 300 mg by mouth 3 (three) times daily.  03/29/20  Yes [name], MD  insulin degludec (TRESIBA FLEXTOUCH) 100 UNIT/ML FlexTouch Pen Inject 0.3 mLs (30 Units total) into the skin daily. Hold if fasting blood sugars are <130. 02/11/20  Yes Pickard, Cammie Mcgee, MD  multivitamin (RENA-VIT) TABS tablet Take 1 tablet by mouth daily.   Yes [provider]  PROAIR HFA 108 (90 Base) MCG/ACT inhaler INHALE 2 PUFFS BY MOUTH EVERY 6 HOURS AS NEEDED FOR WHEEZING FOR SHORTNESS OF BREATH Patient taking differently: Inhale 2 puffs into the lungs every 6 (six) hours as needed for wheezing or shortness of breath.  05/08/20  Yes [name], MD  rosuvastatin (CRESTOR) 20 MG tablet Take 1 tablet (20 mg total) by mouth daily. 11/23/19  Yes [name], MD  Blood Glucose Monitoring Suppl (ONE TOUCH ULTRA 2) w/Device KIT Use to check BS BID-QID Dx:E11.9 05/19/17   [name], MD  cefUROXime (CEFTIN) 500 MG tablet Take 1 tablet (500 mg total) by mouth daily for 7 days. 05/19/20 05/26/20  Bonni Neuser E, PA-C  Insulin Pen Needle (LIVE BETTER PEN NEEDLES) 31G X 6 MM MISC To use with Tresiba pens daily 02/05/19   [name], MD  Lancets [facility] ULTRASOFT) lancets Use as instructed 05/19/17   [name], MD  metroNIDAZOLE (FLAGYL) 500 MG tablet Take 1 tablet (500 mg total) by mouth 2 (two) times daily. 05/19/20   Alyha Marines E, PA-C  ONETOUCH ULTRA test strip USE AS DIRECTED TO MONITOR  FSBS 3 TIMES DAILY 01/27/20   Ishmael Holter A, FNP  oxyCODONE-acetaminophen (PERCOCET) 5-325 MG tablet Take 1 tablet by mouth every 6 (six) hours as needed for severe pain. Patient not taking: Reported on 03/01/2020 10/29/19   Edrick Kins, DPM    Allergies    Penicillins and Adhesive [tape]  Review of Systems   Review of Systems  All other systems are reviewed and are negative for acute change except as noted in the HPI.   Physical Exam Updated Vital Signs BP (!) 95/58 (BP Location: Right Arm)   Pulse (!) 109   Temp 98.8 F (37.1 C) (Oral)   Resp 18   Ht 5' 7"  (1.702 m)   Wt 89.8 kg   SpO2 97%   BMI 31.01 kg/m   Physical Exam Vitals and nursing note reviewed.  Constitutional:      General: She is not in acute distress.    Appearance: She is not ill-appearing.  HENT:     Head: Normocephalic and atraumatic.     Right Ear: Tympanic membrane and external ear normal.     Left Ear: Tympanic membrane and external ear normal.     Nose: Nose normal.     Mouth/Throat:     Mouth: Mucous membranes are moist.     Pharynx: Oropharynx is clear.  Eyes:     General: No scleral icterus.       Right eye: No discharge.        Left eye: No discharge.     Extraocular Movements: Extraocular movements intact.     Conjunctiva/sclera: Conjunctivae normal.     Pupils: Pupils are equal, round, and reactive to light.  Neck:     Vascular: No JVD.  Cardiovascular:     Rate and Rhythm: Regular rhythm. Tachycardia present.     Pulses: Normal pulses.          Radial pulses are 2+ on the right side and 2+ on the left side.     Heart sounds: Normal heart sounds.  Pulmonary:     Comments: Lungs clear to auscultation in all fields. Symmetric chest rise. No wheezing, rales, or rhonchi. Abdominal:     Tenderness: There is abdominal tenderness in the right upper quadrant and right lower quadrant. There is guarding. There is no right CVA tenderness or left CVA tenderness. Positive signs include McBurney's sign.     Comments: Abdomen is soft, non-distended. No rigidity. No peritoneal signs.  Musculoskeletal:        General: Normal range of motion.     Cervical back: Normal range of motion.  Feet:  Comments: Compression  bandages on bilateral feet extending to knees. Dressings are clean dry and intact Skin:    General: Skin is warm and dry.     Capillary Refill: Capillary refill takes less than 2 seconds.  Neurological:     Mental Status: She is oriented to person, place, and time.     GCS: GCS eye subscore is 4. GCS verbal subscore is 5. GCS motor subscore is 6.     Comments: Fluent speech, no facial droop.  Psychiatric:        Behavior: Behavior normal.     ED Results / Procedures / Treatments   Labs (all labs ordered are listed, but only abnormal results are displayed) Labs Reviewed  COMPREHENSIVE METABOLIC PANEL - Abnormal; Notable for the following components:      Result Value   Chloride 92 (*)    Glucose, Bld 154 (*)    BUN 34 (*)    Creatinine, Ser 5.63 (*)    GFR, Estimated 8 (*)    Anion gap 19 (*)    All other components within normal limits  CBC - Abnormal; Notable for the following components:   WBC 10.7 (*)    RBC 3.51 (*)    Hemoglobin 11.4 (*)    HCT 34.5 (*)    RDW 15.6 (*)    All other components within normal limits  LIPASE, BLOOD    EKG None  Radiology CT Abdomen Pelvis W Contrast  Result Date: 05/19/2020 CLINICAL DATA:  Right lower quadrant abdominal pain. History of renal cell carcinoma with prior right nephrectomy and left adrenalectomy EXAM: CT ABDOMEN AND PELVIS WITH CONTRAST TECHNIQUE: Multidetector CT imaging of the abdomen and pelvis was performed using the standard protocol following bolus administration of intravenous contrast. CONTRAST:  142m OMNIPAQUE IOHEXOL 300 MG/ML  SOLN COMPARISON:  12/01/2019 FINDINGS: Lower chest: Included lung bases are clear. Known lingular nodule is not included within the field of view. Heart size is normal. Hepatobiliary: No focal liver abnormality is seen. No gallstones, gallbladder wall thickening, or biliary dilatation. Pancreas: Unremarkable. No pancreatic ductal dilatation or surrounding inflammatory changes. Spleen: Normal  in size without focal abnormality. Adrenals/Urinary Tract: Unremarkable right adrenal gland. Prior left adrenalectomy. Prior right nephrectomy. No soft tissue density within the nephrectomy bed. Left kidney appears within normal limits. No left renal lesion, stone, or hydronephrosis. Left ureter is unremarkable. There is severe circumferential thickening of the urinary bladder wall with adjacent fat stranding. Stomach/Bowel: Stomach is within normal limits. Appendix appears normal (series 5, image 82). There is moderate volume of stool within the cecum and ascending colon, which is distended measuring up to 8 cm in diameter. There is a transition to decompressed colon at the level of the mid ascending colon where there is prominent wall thickening and adjacent pericolonic fat stranding (series 5, image 98). Subcentimeter area of fat attenuation adjacent to the posterior wall of the ascending colon at this location with adjacent fat stranding which may reflect an inflamed epiploic appendage (series 2, image 41). Remainder of the colon is within normal limits. Vascular/Lymphatic: Aortoiliac atherosclerosis without aneurysm. No enlarged abdominopelvic lymph nodes. Reproductive: Unremarkable uterus. Cystic lesion within the left adnexa measuring approximately 2.6 x 2.7 cm, not appreciably changed from prior. Right adnexa unremarkable. Other: No free fluid. No abdominopelvic fluid collection. No pneumoperitoneum. No abdominal wall hernia. Musculoskeletal: No acute osseous findings. No suspicious bone lesion identified. Prominent degenerative osteophyte formation anteriorly within the lumbar spine. IMPRESSION: 1. Prominent distension of the cecum  and proximal ascending colon with stool. There is a transition to decompressed colon at the level of the mid ascending colon where there is prominent wall thickening and adjacent pericolonic fat stranding. Findings are concerning for an underlying infectious or inflammatory  process such as diverticulitis/colitis or epiploic appendagitis. An underlying colonic mass is not excluded. Further evaluation with colonoscopy is recommended following the resolution of patient's acute symptoms. 2. Severe circumferential thickening of the urinary bladder wall with adjacent fat stranding. Correlate with urinalysis for cystitis. 3. Prior right nephrectomy and left adrenalectomy. 4. Cystic lesion within the left adnexa measuring up to 2.6 cm, not appreciably changed from prior study. Aortic Atherosclerosis (ICD10-I70.0). Electronically Signed   By: Davina Poke D.O.   On: 05/19/2020 15:01    Procedures Procedures (including critical care time)  Medications Ordered in ED Medications  sodium chloride 0.9 % bolus 500 mL (0 mLs Intravenous Stopped 05/19/20 1400)  iohexol (OMNIPAQUE) 300 MG/ML solution 100 mL (100 mLs Intravenous Contrast Given 05/19/20 1419)    ED Course  I have reviewed the triage vital signs and the nursing notes.  Pertinent labs & imaging results that were available during my care of the patient were reviewed by me and considered in my medical decision making (see chart for details).  Clinical Course as of May 19 1646  Fri May 19, 2020  1307 Patient states she has a history of soft pressures.  Normal BPs for her range from systolics in the 32T-557.  Denies feeling dizzy or weak  BP(!): 95/58 [KA]    Clinical Course User Index [KA] Cherre Robins, PA-C   Vitals:   05/19/20 1330 05/19/20 1400 05/19/20 1521 05/19/20 1530  BP: 119/72 122/71 92/64 108/71  Pulse: 88 89 83 81  Resp: 18 (!) 23 18 15   Temp:      TempSrc:      SpO2: 95% 95% 97% 96%  Weight:      Height:         MDM Rules/Calculators/A&P                          History provided by patient with additional history obtained from chart review.    Patient presents to the ED with complaints of abdominal pain. Patient nontoxic appearing, in no apparent distress, vitals WNL . On exam  patient tender to RUQ and RLQ, no peritoneal signs. Will evaluate with labs and CT AP. She declines need for analgesics or antiemetics.   Labs personally interpreted.  She has a very mild leukocytosis of 10.7, hemoglobin 11.4 was consistent with her baseline.  BUN/creatinine also consistent with baseline.  She does have been elevated anion gap of 19 with a normal bicarb.  Based within normal range.  Patient given 500 ml bolus of normal saline.   I viewed patient's imaging.  CT scan shows possible diverticulitis/colitis or epiploic appendagitis.  Radiologist also comments that colonic mass cannot be excluded.  She also has severe circumferential thickening of the urinary bladder wall.  Patient has not made urine while here in the ED.  She does have a UA pending at her dialysis center.  Advised that she follow-up with them on the results.  On repeat abdominal exam patient remains without peritoneal signs. Discussed the findings and recommendation of colonoscopy with outpatient GI. Ambulatory referral sent to on call [facility]. She is tolerating PO intake and declines any need for analgesics. Will treat with antibiotics for possible diverticulitis.  Discussed antibiotic options with ED pharmacist Scott. Will treat with second gen cephalosporin and flagyl. Also recommend symptomatic treatment for constipation. I discussed results, treatment plan, need for PCP follow-up, and return precautions with the patient. Provided opportunity for questions, patient confirmed understanding and is in agreement with plan. In basket message sent to PCP as well to inform him of the findings today. The patient was discussed with and seen by Dr. Langston Masker who agrees with the treatment plan.   Portions of this note were generated with [employer]. Dictation errors may occur despite best attempts at proofreading.   Final Clinical Impression(s) / ED Diagnoses Final diagnoses:  Abdominal pain, unspecified abdominal  location    Rx / DC Orders ED Discharge Orders         Ordered    cefUROXime (CEFTIN) 500 MG tablet  Daily        05/19/20 1559    metroNIDAZOLE (FLAGYL) 500 MG tablet  2 times daily        05/19/20 1559    Ambulatory referral to Gastroenterology        05/19/20 1617           Marylouise Mallet, Harley Hallmark, PA-C 05/19/20 1647    Fredia Sorrow, MD 05/19/20 2211

## 2020-05-20 DIAGNOSIS — N186 End stage renal disease: Secondary | ICD-10-CM | POA: Diagnosis not present

## 2020-05-20 DIAGNOSIS — D631 Anemia in chronic kidney disease: Secondary | ICD-10-CM | POA: Diagnosis not present

## 2020-05-20 DIAGNOSIS — Z23 Encounter for immunization: Secondary | ICD-10-CM | POA: Diagnosis not present

## 2020-05-20 DIAGNOSIS — D689 Coagulation defect, unspecified: Secondary | ICD-10-CM | POA: Diagnosis not present

## 2020-05-20 DIAGNOSIS — R300 Dysuria: Secondary | ICD-10-CM | POA: Diagnosis not present

## 2020-05-20 DIAGNOSIS — Z992 Dependence on renal dialysis: Secondary | ICD-10-CM | POA: Diagnosis not present

## 2020-05-20 DIAGNOSIS — E1129 Type 2 diabetes mellitus with other diabetic kidney complication: Secondary | ICD-10-CM | POA: Diagnosis not present

## 2020-05-20 DIAGNOSIS — N2581 Secondary hyperparathyroidism of renal origin: Secondary | ICD-10-CM | POA: Diagnosis not present

## 2020-05-21 DIAGNOSIS — I129 Hypertensive chronic kidney disease with stage 1 through stage 4 chronic kidney disease, or unspecified chronic kidney disease: Secondary | ICD-10-CM | POA: Diagnosis not present

## 2020-05-21 DIAGNOSIS — N186 End stage renal disease: Secondary | ICD-10-CM | POA: Diagnosis not present

## 2020-05-21 DIAGNOSIS — Z992 Dependence on renal dialysis: Secondary | ICD-10-CM | POA: Diagnosis not present

## 2020-05-23 DIAGNOSIS — R300 Dysuria: Secondary | ICD-10-CM | POA: Diagnosis not present

## 2020-05-23 DIAGNOSIS — N2581 Secondary hyperparathyroidism of renal origin: Secondary | ICD-10-CM | POA: Diagnosis not present

## 2020-05-23 DIAGNOSIS — N186 End stage renal disease: Secondary | ICD-10-CM | POA: Diagnosis not present

## 2020-05-23 DIAGNOSIS — N390 Urinary tract infection, site not specified: Secondary | ICD-10-CM | POA: Diagnosis not present

## 2020-05-23 DIAGNOSIS — D631 Anemia in chronic kidney disease: Secondary | ICD-10-CM | POA: Diagnosis not present

## 2020-05-23 DIAGNOSIS — Z992 Dependence on renal dialysis: Secondary | ICD-10-CM | POA: Diagnosis not present

## 2020-05-23 DIAGNOSIS — D689 Coagulation defect, unspecified: Secondary | ICD-10-CM | POA: Diagnosis not present

## 2020-05-24 ENCOUNTER — Other Ambulatory Visit: Payer: Self-pay

## 2020-05-24 ENCOUNTER — Encounter (HOSPITAL_BASED_OUTPATIENT_CLINIC_OR_DEPARTMENT_OTHER): Payer: Medicare Other | Attending: Physician Assistant | Admitting: Physician Assistant

## 2020-05-24 DIAGNOSIS — I872 Venous insufficiency (chronic) (peripheral): Secondary | ICD-10-CM | POA: Insufficient documentation

## 2020-05-24 DIAGNOSIS — E11621 Type 2 diabetes mellitus with foot ulcer: Secondary | ICD-10-CM | POA: Insufficient documentation

## 2020-05-24 DIAGNOSIS — E1140 Type 2 diabetes mellitus with diabetic neuropathy, unspecified: Secondary | ICD-10-CM | POA: Diagnosis not present

## 2020-05-24 DIAGNOSIS — I120 Hypertensive chronic kidney disease with stage 5 chronic kidney disease or end stage renal disease: Secondary | ICD-10-CM | POA: Insufficient documentation

## 2020-05-24 DIAGNOSIS — L97522 Non-pressure chronic ulcer of other part of left foot with fat layer exposed: Secondary | ICD-10-CM | POA: Diagnosis not present

## 2020-05-24 DIAGNOSIS — L97312 Non-pressure chronic ulcer of right ankle with fat layer exposed: Secondary | ICD-10-CM | POA: Insufficient documentation

## 2020-05-24 DIAGNOSIS — E11622 Type 2 diabetes mellitus with other skin ulcer: Secondary | ICD-10-CM | POA: Insufficient documentation

## 2020-05-24 DIAGNOSIS — L97812 Non-pressure chronic ulcer of other part of right lower leg with fat layer exposed: Secondary | ICD-10-CM | POA: Diagnosis not present

## 2020-05-24 DIAGNOSIS — L97822 Non-pressure chronic ulcer of other part of left lower leg with fat layer exposed: Secondary | ICD-10-CM | POA: Insufficient documentation

## 2020-05-24 DIAGNOSIS — E1122 Type 2 diabetes mellitus with diabetic chronic kidney disease: Secondary | ICD-10-CM | POA: Diagnosis not present

## 2020-05-24 DIAGNOSIS — Z992 Dependence on renal dialysis: Secondary | ICD-10-CM | POA: Insufficient documentation

## 2020-05-24 DIAGNOSIS — N186 End stage renal disease: Secondary | ICD-10-CM | POA: Diagnosis not present

## 2020-05-24 DIAGNOSIS — L97512 Non-pressure chronic ulcer of other part of right foot with fat layer exposed: Secondary | ICD-10-CM | POA: Insufficient documentation

## 2020-05-24 NOTE — Progress Notes (Addendum)
[name], [name] ([id number]) Visit Report for 05/24/2020 Chief Complaint Document Details Patient Name: Date of Service: [name] [name], [name] 05/24/2020 3:00 PM Medical Record Number: [id number] Patient Account Number: [id number] Date of Birth/Sex: Treating RN: 25-Oct-1950 (69 y.o. [name] Primary Care Provider: Jenna [name] Other Clinician: Referring Provider: Treating Provider/Extender: [name] in Treatment: 15 Information Obtained from: Patient Chief Complaint Multiple LE Ulcers Electronic Signature(s) Signed: 05/24/2020 3:45:30 PM By: [name] PA-C Entered By: [name] on 05/24/2020 15:45:29 -------------------------------------------------------------------------------- HPI Details Patient Name: Date of Service: [name], [name] 05/24/2020 3:00 PM Medical Record Number: [id number] Patient Account Number: [id number] Date of Birth/Sex: Treating RN: [birth year] (69 y.o. [name] Primary Care Provider: Jenna [name] Other Clinician: Referring Provider: Treating Provider/Extender: [name] in Treatment: 15 History of Present Illness HPI Description: Evaluate7/21/2021 today patient presents for evaluation of ulcerations on her legs which she tells me tend to come and go as far as small blistering locations and sometimes are better than other times. Right now she tells me that this is actually doing a little bit better but nonetheless will not completely go away. She has ulcerations bilaterally on her lower extremities. The right is worse than the left. 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 The patient has no evidence of active infection at this time. She however appears to have proficient blood flow into the extremities and I think would tolerate a 3 layer compression wrap she  was noncompressible on arterial studies. Nonetheless there was no obvious signs of occlusion. 02/16/2020 on evaluation today patient appears to be doing much better in regard to her wounds in general. On the past week she has made significant improvements which is great news there is no signs of active infection at this time. No fevers, chills, nausea, vomiting, or diarrhea. 02/23/2020 on evaluation today patient appears to be doing well in regard to her lower extremity ulcers and ankle ulcers. Overall I feel like she is making great progress and in general I am extremely pleased with how things stand. There is no sign of active infection at this time which is also good news. She will require slight debridement around the right ankle region. 03/02/20-Patient returns at 1 week, has few new areas open on the left leg, especially 1 healed area on the medial ankle that has slight small open area now, to anterior leg areas that started out as blisters that are open now. She was using juxta lite compression to the left, she used Band-Aid to cover the vesicle that had ruptured on the left anterior leg, we are using 3 layer compression on the right. We are using silver alginate to the toe wound on the left. 03/08/2020 upon evaluation today patient unfortunately is doing much worse in regard to her right leg although her left leg looks like is doing better. Again we have taken her compression wraps off last week since she was doing well and transitioned her to her Velcro compression wrap. However apparently the patient is stating this was not put on here in the clinic and she never put it on at home. With that being said unfortunately she developed swelling and opened up new wounds which are present today she has quite a few of them in fact. 03/15/2020 on evaluation today patient appears to be doing well with  regard to her wounds on the lower extremities bilaterally. Fortunately there is no signs of active infection  at this time. No fever chills noted. Overall I am actually very pleased with where things stand currently. 03/22/2020 on evaluation today patient appears to be doing fairly well in regard to her wounds in general. She just has really one area left on her toe everything else is healed on her legs. She does have her Velcro compression wraps which I think are appropriate at this point. Fortunately there is no signs of active infection at this time. No fevers, chills, nausea, vomiting, or diarrhea. 03/29/2020 on evaluation today patient appears to be doing poorly in regard to her right leg which is pretty much completely reopened compared to last week. She states it was okay until Saturday when she woke up she had blisters. With that being said it appears that this is likely stemming from the fact that the patient is apparently though I just found this out today going to sleep in her bed with her legs up on the bed with her but throughout the night she tends to end up with her legs hanging off the side or bottom of the bed on the floor. With that being said I think that this likely is happening much too long and then she has significant swelling the builds up as she sleeps. Obviously I think this is the reasoning behind what we are seeing currently. 04/05/2020 on evaluation today patient appears to be doing quite well with regard to her leg ulcers at this point. Fortunately there is no signs of anything significantly open again which is great news nonetheless I am to continue wrapping her for at least a couple of weeks due to the fact that she seems to continually reopen as soon as we unwrapped her. 04/12/2020 upon evaluation today patient appears to be doing excellent in regard to her leg ulcers all appear to be improving which is great news. She is also doing better in regard to the toe the infection seems to be under better control and the Santyl is helping to clean this up. Overall very pleased much more  so than what I saw last week. 04/19/2020 on evaluation today patient appears to be doing well with regard to her leg ulcers which are showing no signs of opening at this point. Her toe is also showing signs of improvement she is about done with her antibiotic. Overall I feel like things are doing quite well. 04/26/2020 upon evaluation today patient appears to be doing unfortunately a little bit worse compared to previous. She has reopened wounds on the right anterior lower extremity. This is unfortunate as we were hoping that this would really maintain healing. That does not appear to be the case. I think with him the need to see about making a referral for venous studies and provider evaluation with [facility] imaging with the interventional radiologist. 08/11/2019 upon evaluation today patient appears to be doing well in general in regard to her legs. Her right leg she does have a new wound that opened since last week. On the left leg everything appears to be healed she does still have the opening on her toe which we are managing but again this does not appear to be any worse. She just needs to get this to feeling and cover over. There is no evidence of infection which is at least great news. 05/17/2020 upon evaluation today patient appears to be doing decently well in regard to  her leg ulcers this is good news. She does have still an opening on the left second toe. She has been let this dry out that not really what we want to see currently. For that reason I discussed with her again the fact that she needs to keep this covered and not allow it to dry out in order to allow for appropriate healing. She voiced understanding and states she did not know that. 05/24/2020 upon evaluation today patient's legs both appear to be still be completely healed which is great news. We are now going to transition into having her utilize her juxta lite compression wraps on the bilateral lower extremities. She is  definitely ready for this. Her toe also seems to be doing better. Electronic Signature(s) Signed: 05/24/2020 5:16:52 PM By: [name] PA-C Entered By: [name] on 05/24/2020 17:16:52 -------------------------------------------------------------------------------- Physical Exam Details Patient Name: Date of Service: [facility] [name], [place] [name] 05/24/2020 3:00 PM Medical Record Number: [id number] Patient Account Number: [id number] Date of Birth/Sex: Treating RN: 1951/02/01 (69 y.o. [name] Primary Care Provider: Jenna [name] Other Clinician: Referring Provider: Treating Provider/Extender: [name] in Treatment: 49 Constitutional Well-nourished and well-hydrated in no acute distress. Respiratory normal breathing without difficulty. Psychiatric this patient is able to make decisions and demonstrates good insight into disease process. Alert and Oriented x 3. pleasant and cooperative. Notes His leg ulcers are both completely healed which is great news and overall very pleased in that regard. There does not appear to be any signs of active infection which is great news and in general I feel like that the patient is tolerating the compression both juxta light and wraps without any complication whatsoever. Her toe does not appear to be infected. Electronic Signature(s) Signed: 05/24/2020 5:17:14 PM By: [name] PA-C Entered By: [name] on 05/24/2020 17:17:14 -------------------------------------------------------------------------------- Physician Orders Details Patient Name: Date of Service: [facility] [name], [place] [name] 05/24/2020 3:00 PM Medical Record Number: [id number] Patient Account Number: [id number] Date of Birth/Sex: Treating RN: [birth year] (69 y.o. [name] Primary Care Provider: Jenna [name] Other Clinician: Referring Provider: Treating Provider/Extender: [name] in Treatment: 15 Verbal  / Phone Orders: No Diagnosis Coding ICD-10 Coding Code Description I87.2 Venous insufficiency (chronic) (peripheral) E11.621 Type 2 diabetes mellitus with foot ulcer L97.822 Non-pressure chronic ulcer of other part of left lower leg with fat layer exposed L97.312 Non-pressure chronic ulcer of right ankle with fat layer exposed L97.812 Non-pressure chronic ulcer of other part of right lower leg with fat layer exposed L97.512 Non-pressure chronic ulcer of other part of right foot with fat layer exposed N18.6 End stage renal disease Z99.2 Dependence on renal dialysis I10 Essential (primary) hypertension Follow-up Appointments Return Appointment in 1 week. Dressing Change Frequency Wound #8 Left T Third oe Change Dressing every other day. Skin Barriers/Peri-Wound Care Moisturizing lotion - to both legs daily Wound Cleansing Wound #8 Left T Third oe May shower and wash wound with soap and water. Primary Wound Dressing Wound #8 Left T Third oe Silver Collagen - moisten with saline Secondary Dressing Wound #8 Left T Third oe Kerlix/Rolled Gauze Dry Gauze Edema Control Avoid standing for long periods of time Elevate legs to the level of the heart or above for 30 minutes daily and/or when sitting, a frequency of: - throughout the day Exercise regularly Support Garment 20-30 mm/Hg pressure to: - juxtalite compression garments both legs daily Electronic Signature(s) Signed: 05/24/2020 5:20:58  PM By: [name] PA-C Signed: 05/24/2020 5:51:45 PM By: [name] RN, BSN Entered By: [name] on 05/24/2020 16:13:32 -------------------------------------------------------------------------------- Problem List Details Patient Name: Date of Service: [facility] [name], [place] [name] 05/24/2020 3:00 PM Medical Record Number: [id number] Patient Account Number: [id number] Date of Birth/Sex: Treating RN: 1951-05-17 (69 y.o. [name] Primary Care Provider: Jenna [name] Other  Clinician: Referring Provider: Treating Provider/Extender: [name] in Treatment: 15 Active Problems ICD-10 Encounter Code Description Active Date MDM Diagnosis I87.2 Venous insufficiency (chronic) (peripheral) 02/09/2020 No Yes E11.621 Type 2 diabetes mellitus with foot ulcer 02/09/2020 No Yes L97.822 Non-pressure chronic ulcer of other part of left lower leg with fat layer exposed7/21/2021 No Yes L97.312 Non-pressure chronic ulcer of right ankle with fat layer exposed 02/09/2020 No Yes L97.812 Non-pressure chronic ulcer of other part of right lower leg with fat layer 02/09/2020 No Yes exposed L97.512 Non-pressure chronic ulcer of other part of right foot with fat layer exposed 02/09/2020 No Yes N18.6 End stage renal disease 02/09/2020 No Yes Z99.2 Dependence on renal dialysis 02/09/2020 No Yes I10 Essential (primary) hypertension 02/09/2020 No Yes Inactive Problems Resolved Problems Electronic Signature(s) Signed: 05/24/2020 3:45:23 PM By: [name] PA-C Entered By: [name] on 05/24/2020 15:45:21 -------------------------------------------------------------------------------- Progress Note Details Patient Name: Date of Service: [name], [place] [name] 05/24/2020 3:00 PM Medical Record Number: [id number] Patient Account Number: [id number] Date of Birth/Sex: Treating RN: 1951/06/19 (69 y.o. [name] Primary Care Provider: Jenna [name] Other Clinician: Referring Provider: Treating Provider/Extender: [name] in Treatment: 15 Subjective Chief Complaint Information obtained from Patient Multiple LE Ulcers History of Present Illness (HPI) Evaluate7/21/2021 today patient presents for evaluation of ulcerations on her legs which she tells me tend to come and go as far as small blistering locations and sometimes are better than other times. Right now she tells me that this is actually doing a little bit better  but nonetheless will not completely go away. She has ulcerations bilaterally on her lower extremities. The right is worse than the left. 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 The patient has no evidence of active infection at this time. She however appears to have proficient blood flow into the extremities and I think would tolerate a 3 layer compression wrap she was noncompressible on arterial studies. Nonetheless there was no obvious signs of occlusion. 02/16/2020 on evaluation today patient appears to be doing much better in regard to her wounds in general. On the past week she has made significant improvements which is great news there is no signs of active infection at this time. No fevers, chills, nausea, vomiting, or diarrhea. 02/23/2020 on evaluation today patient appears to be doing well in regard to her lower extremity ulcers and ankle ulcers. Overall I feel like she is making great progress and in general I am extremely pleased with how things stand. There is no sign of active infection at this time which is also good news. She will require slight debridement around the right ankle region. 03/02/20-Patient returns at 1 week, has few new areas open on the left leg, especially 1 healed area on the medial ankle that has slight small open area now, to anterior leg areas that started out as blisters that are open now. She was using juxta lite compression to the left, she used Band-Aid to cover the  vesicle that had ruptured on the left anterior leg, we are using 3 layer compression on the right. We are using silver alginate to the toe wound on the left. 03/08/2020 upon evaluation today patient unfortunately is doing much worse in regard to her right leg although her left leg looks like is doing better. Again we have taken her compression wraps off last week since she was doing well  and transitioned her to her Velcro compression wrap. However apparently the patient is stating this was not put on here in the clinic and she never put it on at home. With that being said unfortunately she developed swelling and opened up new wounds which are present today she has quite a few of them in fact. 03/15/2020 on evaluation today patient appears to be doing well with regard to her wounds on the lower extremities bilaterally. Fortunately there is no signs of active infection at this time. No fever chills noted. Overall I am actually very pleased with where things stand currently. 03/22/2020 on evaluation today patient appears to be doing fairly well in regard to her wounds in general. She just has really one area left on her toe everything else is healed on her legs. She does have her Velcro compression wraps which I think are appropriate at this point. Fortunately there is no signs of active infection at this time. No fevers, chills, nausea, vomiting, or diarrhea. 03/29/2020 on evaluation today patient appears to be doing poorly in regard to her right leg which is pretty much completely reopened compared to last week. She states it was okay until Saturday when she woke up she had blisters. With that being said it appears that this is likely stemming from the fact that the patient is apparently though I just found this out today going to sleep in her bed with her legs up on the bed with her but throughout the night she tends to end up with her legs hanging off the side or bottom of the bed on the floor. With that being said I think that this likely is happening much too long and then she has significant swelling the builds up as she sleeps. Obviously I think this is the reasoning behind what we are seeing currently. 04/05/2020 on evaluation today patient appears to be doing quite well with regard to her leg ulcers at this point. Fortunately there is no signs of anything significantly open again  which is great news nonetheless I am to continue wrapping her for at least a couple of weeks due to the fact that she seems to continually reopen as soon as we unwrapped her. 04/12/2020 upon evaluation today patient appears to be doing excellent in regard to her leg ulcers all appear to be improving which is great news. She is also doing better in regard to the toe the infection seems to be under better control and the Santyl is helping to clean this up. Overall very pleased much more so than what I saw last week. 04/19/2020 on evaluation today patient appears to be doing well with regard to her leg ulcers which are showing no signs of opening at this point. Her toe is also showing signs of improvement she is about done with her antibiotic. Overall I feel like things are doing quite well. 04/26/2020 upon evaluation today patient appears to be doing unfortunately a little bit worse compared to previous. She has reopened wounds on the right anterior lower extremity. This is unfortunate as we were hoping that  this would really maintain healing. That does not appear to be the case. I think with him the need to see about making a referral for venous studies and provider evaluation with [facility] imaging with the interventional radiologist. 08/11/2019 upon evaluation today patient appears to be doing well in general in regard to her legs. Her right leg she does have a new wound that opened since last week. On the left leg everything appears to be healed she does still have the opening on her toe which we are managing but again this does not appear to be any worse. She just needs to get this to feeling and cover over. There is no evidence of infection which is at least great news. 05/17/2020 upon evaluation today patient appears to be doing decently well in regard to her leg ulcers this is good news. She does have still an opening on the left second toe. She has been let this dry out that not really what we want  to see currently. For that reason I discussed with her again the fact that she needs to keep this covered and not allow it to dry out in order to allow for appropriate healing. She voiced understanding and states she did not know that. 05/24/2020 upon evaluation today patient's legs both appear to be still be completely healed which is great news. We are now going to transition into having her utilize her juxta lite compression wraps on the bilateral lower extremities. She is definitely ready for this. Her toe also seems to be doing better. Objective Constitutional Well-nourished and well-hydrated in no acute distress. Vitals Time Taken: 3:53 PM, Height: 67 in, Weight: 202 lbs, BMI: 31.6, Temperature: 99.4 F, Pulse: 116 bpm, Respiratory Rate: 20 breaths/min, Blood Pressure: 91/61 mmHg, Capillary Blood Glucose: 81 mg/dl. Respiratory normal breathing without difficulty. Psychiatric this patient is able to make decisions and demonstrates good insight into disease process. Alert and Oriented x 3. pleasant and cooperative. General Notes: His leg ulcers are both completely healed which is great news and overall very pleased in that regard. There does not appear to be any signs of active infection which is great news and in general I feel like that the patient is tolerating the compression both juxta light and wraps without any complication whatsoever. Her toe does not appear to be infected. Integumentary (Hair, Skin) Wound #16 status is Open. Original cause of wound was Gradually Appeared. The wound is located on the Right,Medial Lower Leg. The wound measures 0cm length x 0cm width x 0cm depth; 0cm^2 area and 0cm^3 volume. There is no tunneling or undermining noted. There is a none present amount of drainage noted. The wound margin is flat and intact. There is no granulation within the wound bed. There is no necrotic tissue within the wound bed. Wound #8 status is Open. Original cause of wound was  Blister. The wound is located on the Left T Third. The wound measures 0.4cm length x 0.5cm width x oe 0.2cm depth; 0.157cm^2 area and 0.031cm^3 volume. There is Fat Layer (Subcutaneous Tissue) exposed. There is no tunneling or undermining noted. There is a medium amount of serosanguineous drainage noted. The wound margin is flat and intact. There is small (1-33%) red granulation within the wound bed. There is a large (67-100%) amount of necrotic tissue within the wound bed including Adherent Slough. 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 Dressing Change Frequency: Wound #8 Left T Third: oe Change Dressing every other day. Skin Barriers/Peri-Wound Care: Moisturizing lotion - to both legs daily Wound Cleansing: Wound #8 Left T Third: oe May shower and wash wound with soap and water. Primary Wound Dressing: Wound #8 Left T Third: oe Silver Collagen - moisten with saline Secondary Dressing: Wound #8 Left T Third: oe Kerlix/Rolled Gauze Dry Gauze Edema Control: Avoid standing for long periods of time Elevate legs to the level of the heart or above for 30 minutes daily and/or when sitting, a frequency of: - throughout the day Exercise regularly Support Garment 20-30 mm/Hg pressure to: - juxtalite compression garments both legs daily 1. Would recommend currently that we go to continue with the wound care measures as before and the patient is in agreement  the plan if anything changes or worsens in the meantime she will contact the office and let me know otherwise for the time being we will get a continue with the silver collagen moistened with saline. This is to the toe. 2. She will use the juxta lite compression wraps for bilateral lower extremities. 3. I also can recommend she continue to elevate her legs when she is resting. We will see patient back for reevaluation in 1 week here in the clinic. If anything worsens or changes patient will contact our office for additional recommendations. Electronic Signature(s) Signed: 05/24/2020 5:17:47 PM By: [name] PA-C Entered By: [name] on 05/24/2020 17:17:46 -------------------------------------------------------------------------------- SuperBill Details Patient Name: Date of Service: [name], [place] [name] 05/24/2020 Medical Record Number: [id number] Patient Account Number: [id number] Date of Birth/Sex: Treating RN: [birth year] (69 y.o. [name] Primary Care Provider: Jenna [name] Other Clinician: Referring Provider: Treating Provider/Extender: [name] in Treatment: 15 Diagnosis Coding ICD-10 Codes Code Description I87.2 Venous insufficiency (chronic) (peripheral) E11.621 Type 2 diabetes mellitus with foot ulcer L97.822 Non-pressure chronic ulcer of other part of left lower leg with fat layer exposed L97.312 Non-pressure chronic ulcer of right ankle with fat layer exposed L97.812 Non-pressure chronic ulcer of other part of right lower leg with fat layer exposed L97.512 Non-pressure chronic ulcer of other part of right foot with fat layer exposed N18.6 End stage renal disease Z99.2 Dependence on renal dialysis I10 Essential (primary) hypertension Facility Procedures CPT4 Code: 70786754 Description: 99213 - WOUND CARE VISIT-LEV 3 EST PT Modifier: Quantity: 1 Physician Procedures : CPT4 Code Description Modifier [id number] - WC  PHYS LEVEL 3 - EST PT ICD-10 Diagnosis Description I87.2 Venous insufficiency (chronic) (peripheral) E11.621 Type 2 diabetes mellitus with foot ulcer L97.822 Non-pressure chronic ulcer of other part of  left lower leg with fat layer exposed L97.312 Non-pressure chronic ulcer of right ankle with fat layer exposed Quantity: 1 Electronic Signature(s) Signed: 05/24/2020 5:17:59 PM By: [name] PA-C Entered By: [name] on 05/24/2020 17:17:59

## 2020-05-26 ENCOUNTER — Other Ambulatory Visit: Payer: Self-pay

## 2020-05-26 ENCOUNTER — Ambulatory Visit (INDEPENDENT_AMBULATORY_CARE_PROVIDER_SITE_OTHER): Payer: Medicare Other | Admitting: Family Medicine

## 2020-05-26 VITALS — BP 120/70 | HR 110 | Temp 97.9°F | Ht 67.0 in | Wt 201.0 lb

## 2020-05-26 DIAGNOSIS — K529 Noninfective gastroenteritis and colitis, unspecified: Secondary | ICD-10-CM

## 2020-05-26 NOTE — Progress Notes (Signed)
[name], [name] ([id number]) Visit Report for 05/24/2020 Arrival Information Details Patient Name: Date of Service: [name] [name], [name] [name] K. 05/24/2020 3:00 PM Medical Record Number: [id number] Patient Account Number: [id number] Date of Birth/Sex: Treating RN: 1951/03/16 (69 y.o. [name] Primary Care [name]: [name] Other Clinician: Referring [name]: Treating Gilmer Kaminsky/Extender: Christella [name] in Treatment: 15 Visit Information History Since Last Visit Added or deleted any medications: No Patient Arrived: Wheel Chair Any [name] allergies or adverse reactions: No Arrival Time: 15:50 Had a fall or experienced change in No Accompanied By: self activities of daily living that may affect Transfer Assistance: None risk of falls: Patient Identification Verified: Yes Signs or symptoms of abuse/neglect since last visito No Secondary Verification Process Completed: Yes Hospitalized since last visit: No Patient Requires Transmission-Based Precautions: No Implantable device outside of the clinic excluding No Patient Has Alerts: Yes cellular tissue based products placed in the [name] Patient Alerts: Right ABI: [name] since last visit: Left ABI: [name] Has Dressing in Place as Prescribed: Yes Has Compression in Place as Prescribed: Yes Pain Present Now: No Electronic Signature(s) Signed: 05/24/2020 5:31:28 PM By: [name] Entered By: [name] on 05/24/2020 15:53:52 -------------------------------------------------------------------------------- Encounter Discharge Information Details Patient Name: Date of Service: [name], [place] [name] K. 05/24/2020 3:00 PM Medical Record Number: [id number] Patient Account Number: [id number] Date of Birth/Sex: Treating RN: [birth year] (69 y.o. [name] Primary Care [name]: [name] Other Clinician: Referring [name]: Treating [name]/Extender: Christella [name] in Treatment: 15 Encounter  Discharge Information Items Discharge Condition: Stable Ambulatory Status: Wheelchair Discharge Destination: Home Transportation: Private Auto Accompanied By: self Schedule Follow-up Appointment: Yes Clinical Summary of Care: Patient Declined Electronic Signature(s) Signed: 05/26/2020 5:00:24 PM By: [name] Coria RN Entered By: [name] on 05/24/2020 16:24:18 -------------------------------------------------------------------------------- Lower Extremity Assessment Details Patient Name: Date of Service: [name] [name], [name] [name] K. 05/24/2020 3:00 PM Medical Record Number: [id number] Patient Account Number: [id number] Date of Birth/Sex: Treating RN: 10-Nov-1950 (69 y.o. [name] Primary Care [name]: [name] Other Clinician: Referring Onofre Gains: Treating [name]/Extender: Christella [name] in Treatment: 15 Edema Assessment Assessed: [name]: Yes] [name]: Yes] Edema: [Left: No] [Right: No] Calf Left: Right: Point of Measurement: 38 cm From Medial Instep 34 cm 33 cm Ankle Left: Right: Point of Measurement: 13 cm From Medial Instep 23 cm 23 cm Vascular Assessment Pulses: Dorsalis Pedis Palpable: [Left:Yes] [Right:Yes] Electronic Signature(s) Signed: 05/24/2020 5:31:28 PM By: [name] Entered By: [name] on 05/24/2020 16:03:22 -------------------------------------------------------------------------------- [name] Details Patient Name: Date of Service: [name] [name], [name] [name] K. 05/24/2020 3:00 PM Medical Record Number: [id number] Patient Account Number: [id number] Date of Birth/Sex: Treating RN: 1950/11/14 (69 y.o. [name] Primary Care Keyia Moretto: [name] Other Clinician: Referring Georgi Tuel: Treating [name]/Extender: Christella [name] in Treatment: 15 Active Inactive Venous Leg Ulcer Nursing Diagnoses: Knowledge deficit related to disease process and management Potential for  venous Insuffiency (use before diagnosis confirmed) Goals: Patient will maintain optimal edema control Date Initiated: 02/09/2020 Target Resolution Date: 06/07/2020 Goal Status: Active Patient/caregiver will verbalize understanding of disease process and disease management Date Initiated: 02/09/2020 Date Inactivated: 03/02/2020 Target Resolution Date: 03/08/2020 Goal Status: Met Interventions: Assess peripheral edema status every visit. Compression as ordered Provide education on venous insufficiency Treatment Activities: Therapeutic compression applied : 02/09/2020 Notes: Wound/Skin Impairment Nursing Diagnoses: Impaired tissue integrity Knowledge deficit related to ulceration/compromised skin integrity Goals: Patient/caregiver will verbalize understanding of skin care regimen  Date Initiated: 02/09/2020 Target Resolution Date: 06/07/2020 Goal Status: Active Ulcer/skin breakdown will have a volume reduction of 30% by week 4 Date Initiated: 02/09/2020 Date Inactivated: 03/02/2020 Target Resolution Date: 03/08/2020 Goal Status: Met Interventions: Assess patient/caregiver ability to obtain necessary supplies Assess patient/caregiver ability to perform ulcer/skin care regimen upon admission and as needed Assess ulceration(s) every visit Provide education on ulcer and skin care Treatment Activities: Skin care regimen initiated : 02/09/2020 Topical wound management initiated : 02/09/2020 Notes: Electronic Signature(s) Signed: 05/24/2020 5:51:45 PM By: [name] Gouty RN, BSN Entered By: [name] on 05/24/2020 16:10:50 -------------------------------------------------------------------------------- Pain Assessment Details Patient Name: Date of Service: [name], [place] [name] K. 05/24/2020 3:00 PM Medical Record Number: [id number] Patient Account Number: [id number] Date of Birth/Sex: Treating RN: Jan 27, 1951 (69 y.o. [name] Primary Care Sue Fernicola: [name] Other  Clinician: Referring [name]: Treating [name] in Treatment: 15 Active Problems Location of Pain Severity and Description of Pain Patient Has Paino No Site Locations Rate the pain. Rate the pain. Current Pain Level: 0 Pain Management and Medication Current Pain Management: Medication: No Cold Application: No Rest: No Massage: No Activity: No T.E.N.S.: No Heat Application: No Leg drop or elevation: No Is the Current Pain Management Adequate: Adequate How does your wound impact your activities of daily livingo Sleep: No Bathing: No Appetite: No Relationship With Others: No Bladder Continence: No Emotions: No Bowel Continence: No Work: No Toileting: No Drive: No Dressing: No Hobbies: No Electronic Signature(s) Signed: 05/24/2020 5:31:28 PM By: [name] Entered By: [name] on 05/24/2020 16:02:53 -------------------------------------------------------------------------------- Patient/Caregiver Education Details Patient Name: Date of Service: [name], [place] [name] K. 11/3/2021andnbsp3:00 PM Medical Record Number: [id number] Patient Account Number: [id number] Date of Birth/Gender: Treating RN: 1950/10/19 (69 y.o. [name] Primary Care Physician: [name] Other Clinician: Referring Physician: Treating Physician/Extender: Christella [name] in Treatment: 15 Education Assessment Education Provided To: Patient Education Topics Provided Venous: Methods: Explain/Verbal Responses: Reinforcements needed, State content correctly Wound/Skin Impairment: Methods: Explain/Verbal Responses: Reinforcements needed, State content correctly Electronic Signature(s) Signed: 05/24/2020 5:51:45 PM By: [name] Gouty RN, BSN Signed: 05/24/2020 5:51:45 PM By: [name] Gouty RN, BSN Entered By: [name] on 05/24/2020  16:11:12 -------------------------------------------------------------------------------- Wound Assessment Details Patient Name: Date of Service: [name], [place] [name] K. 05/24/2020 3:00 PM Medical Record Number: [id number] Patient Account Number: [id number] Date of Birth/Sex: Treating RN: [birth year] (69 y.o. [name], Meta.Reding Primary Care Velvia Mehrer: [name] Other Clinician: Referring Samatha Anspach: Treating Jaydenn Boccio/Extender: Christella [name] in Treatment: 15 Wound Status Wound Number: 16 Primary Venous Leg Ulcer Etiology: Wound Location: Right, Medial Lower Leg Wound Open Wounding Event: Gradually Appeared Status: Date Acquired: 05/10/2020 Comorbid Cataracts, Anemia, Asthma, Hypertension, Type II Diabetes, End Weeks Of Treatment: 2 History: Stage Renal Disease, Osteomyelitis, Neuropathy Clustered Wound: No Wound Measurements Length: (cm) Width: (cm) Depth: (cm) Area: (cm) Volume: (cm) 0 % Reduction in Area: 100% 0 % Reduction in Volume: 100% 0 Epithelialization: Large (67-100%) 0 Tunneling: No 0 Undermining: No Wound Description Classification: Full Thickness Without Exposed Support Structures Wound Margin: Flat and Intact Exudate Amount: None Present Foul Odor After Cleansing: No Slough/Fibrino No Wound Bed Granulation Amount: None Present (0%) Exposed Structure Necrotic Amount: None Present (0%) Fascia Exposed: No Fat Layer (Subcutaneous Tissue) Exposed: No Tendon Exposed: No Muscle Exposed: No Joint Exposed: No Bone Exposed: No Electronic Signature(s) Signed: 05/24/2020 5:31:28 PM By: [name] Entered By: [name] on 05/24/2020 16:03:41 -------------------------------------------------------------------------------- Wound Assessment  Details Patient Name: Date of Service: [name] [name], [name] [name] K. 05/24/2020 3:00 PM Medical Record Number: [id number] Patient Account Number: [id number] Date of Birth/Sex: Treating RN: January 11, 1951 (69 y.o.  [name], Meta.Reding Primary Care Josimar Corning: [name] Other Clinician: Referring Kimimila Tauzin: Treating Elizaveta Mattice/Extender: Christella [name] in Treatment: 15 Wound Status Wound Number: 8 Primary Diabetic Wound/Ulcer of the Lower Extremity Etiology: Wound Location: Left T Third oe Wound Open Wounding Event: Blister Status: Status: Date Acquired: 02/23/2020 Comorbid Cataracts, Anemia, Asthma, Hypertension, Type II Diabetes, End Weeks Of Treatment: 13 History: Stage Renal Disease, Osteomyelitis, Neuropathy Clustered Wound: No Wound Measurements Length: (cm) 0.4 Width: (cm) 0.5 Depth: (cm) 0.2 Area: (cm) 0.157 Volume: (cm) 0.031 % Reduction in Area: -11.3% % Reduction in Volume: -121.4% Epithelialization: Small (1-33%) Tunneling: No Undermining: No Wound Description Classification: Grade 2 Wound Margin: Flat and Intact Exudate Amount: Medium Exudate Type: Serosanguineous Exudate Color: red, brown Foul Odor After Cleansing: No Slough/Fibrino Yes Wound Bed Granulation Amount: Small (1-33%) Exposed Structure Granulation Quality: Red Fascia Exposed: No Necrotic Amount: Large (67-100%) Fat Layer (Subcutaneous Tissue) Exposed: Yes Necrotic Quality: Adherent Slough Tendon Exposed: No Muscle Exposed: No Joint Exposed: No Bone Exposed: No Treatment Notes Wound #8 (Left Toe Third) 1. Cleanse With Wound Cleanser 3. Primary Dressing Applied [employer] Other primary dressing (specifiy in notes) 4. Secondary Dressing Dry Gauze Notes normal saline to moisten collagen . netting. Electronic Signature(s) Signed: 05/24/2020 5:31:28 PM By: [name] Entered By: [name] on 05/24/2020 16:04:08 -------------------------------------------------------------------------------- Vitals Details Patient Name: Date of Service: [name], [place] [name] K. 05/24/2020 3:00 PM Medical Record Number: [id number] Patient Account Number: [id number] Date of Birth/Sex:  Treating RN: 10-Dec-1950 (69 y.o. [name], [name] Primary Care Yanilen Adamik: [name] Other Clinician: Referring Paxtyn Boyar: Treating Alessander Sikorski/Extender: Christella [name] in Treatment: 15 Vital Signs Time Taken: 15:53 Temperature (F): 99.4 Height (in): 67 Pulse (bpm): 116 Weight (lbs): 202 Respiratory Rate (breaths/min): 20 Body Mass Index (BMI): 31.6 Blood Pressure (mmHg): 91/61 Capillary Blood Glucose (mg/dl): 81 Reference Range: 80 - 120 mg / dl Electronic Signature(s) Signed: 05/24/2020 5:31:28 PM By: [name] Entered By: [name] on 05/24/2020 15:56:07

## 2020-05-26 NOTE — Progress Notes (Signed)
Subjective:    Patient ID: [name], female    DOB: Dec 01, 1950, 69 y.o.   MRN: [id number]  Please see CT report from ER as well as previous office visit.  In short I saw the patient with right lower quadrant abdominal pain and I was concerned about an acute abdomen so I sent her to the emergency room for a CAT scan.  CAT scan showed thickening of the colon wall just above the cecum consistent with colitis.  There was also pericolonic stranding suggesting inflammation.  Therefore they treated the patient for colitis with cefuroxime and Flagyl.  Patient only took 1 dose of the antibiotic and then stopped it due to nausea and vomiting.  She had nausea and vomiting on Saturday and Sunday.  However after 2 days the nausea and vomiting subsided.  The right lower quadrant abdominal pain has completely stopped.  She has no further right lower quadrant abdominal pain.  She denies any fever or chills.  Her abdomen is soft nondistended and nontender. Past Medical History:  Diagnosis Date  . Anemia   . Anemia associated with chronic renal failure   . Anemia in chronic kidney disease 09/29/2018  . Arthritis   . Asthma   . Blood transfusion without reported diagnosis    Phreesia 02/27/2020  . Cancer ([place])    Phreesia 02/27/2020  . Cataract    OD  . Chronic kidney disease    Phreesia 02/27/2020  . Coronary artery calcification seen on CAT scan 02/17/2018   Coronary calcification on CT  . Depression   . Diabetes mellitus without complication ([place])    Phreesia 02/27/2020  . Diabetic retinopathy of both eyes ([place])   . Diabetic ulcer of right foot associated with type 2 diabetes mellitus ([place])   . ESRD (end stage renal disease) on dialysis ([place])   . Essential hypertension 02/17/2018   Essential hypertension  . GERD (gastroesophageal reflux disease)    pt denies  . HLD (hyperlipidemia)   . Hypertension   . Hypertensive retinopathy    OU  . Left ventricular dysfunction 04/07/2018   Left ventricle  dysfunction  . Microalbuminuria due to type 2 diabetes mellitus ([place])   . Neuromuscular disorder ([place])    diabetic neuropathy  . Nonproliferative retinopathy due to secondary diabetes ([place])   . Nonsustained ventricular tachycardia ([place]) 08/28/2018   Nonsustained ventricular tachycardia  . Pneumonia    hx of   . Renal cell cancer ([place])   . Right renal mass 03/10/2017  . Type 2 diabetes, controlled, with neuropathy ([place]) 06/22/2013  . Ulcer of other part of foot 06/22/2013  . 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 AMPUTATION TOE Right 10/29/2019   Procedure: RIGHT 2ND TOE AMPUTATION;  Surgeon: Edrick Kins, DPM;  Location: [facility];  Service: Podiatry;  Laterality: Right;  . BASCILIC VEIN TRANSPOSITION Left 08/08/2017   Procedure: BASILIC VEIN TRANSPOSITION FIRST STAGE LEFT ARM;  Surgeon: Serafina Mitchell, MD;  Location: [place];  Service: Vascular;  Laterality: Left;  . BASCILIC VEIN TRANSPOSITION Left 05/14/2018   Procedure: SECOND STAGE BASILIC VEIN TRANSPOSITION LEFT ARM;  Surgeon: Serafina Mitchell, MD;  Location: [place];  Service: Vascular;  Laterality: Left;  . CATARACT EXTRACTION Left   . EYE SURGERY    . ROBOTIC ADRENALECTOMY Left 03/10/2017   Procedure: XI ROBOTIC ADRENALECTOMY;  Surgeon: Nickie Retort, MD;  Location: [facility];  Service: Urology;  Laterality: Left;  . ROBOTIC ASSITED PARTIAL NEPHRECTOMY Right 03/10/2017   Procedure: XI ROBOTIC ASSITED RADICAL NEPHRECTOMY;  Surgeon: Nickie Retort, MD;  Location: [facility];  Service: Urology;  Laterality: Right;  . TOE AMPUTATION Bilateral    both great toe ,, left foot 2nd toe 1/2   Current Outpatient Medications on File Prior to Visit  Medication Sig Dispense Refill  . Blood Glucose Monitoring Suppl (ONE TOUCH ULTRA 2) w/Device KIT Use to check BS BID-QID Dx:E11.9 1 each 1  . BREO ELLIPTA 100-25 MCG/INH AEPB Inhale 1 puff by mouth once daily (Patient  taking differently: Inhale 1 puff into the lungs daily. ) 180 each 2  . Carboxymethylcellulose Sodium (THERATEARS OP) Place 1 drop into both eyes daily as needed (dry eyes).    . cefUROXime (CEFTIN) 500 MG tablet Take 1 tablet (500 mg total) by mouth daily for 7 days. 7 tablet 0  . diphenhydrAMINE (BENADRYL) 25 MG tablet Take 25 mg by mouth daily as needed for allergies.    Marland Kitchen gabapentin (NEURONTIN) 300 MG capsule TAKE 1 CAPSULE BY MOUTH THREE TIMES DAILY. REQUIRES OFFICE VISIT BEFORE ANY FURTHER REFILLS CAN BE GIVEN (Patient taking differently: Take 300 mg by mouth 3 (three) times daily. ) 90 capsule 0  . insulin degludec (TRESIBA FLEXTOUCH) 100 UNIT/ML FlexTouch Pen Inject 0.3 mLs (30 Units total) into the skin daily. Hold if fasting blood sugars are <130. 45 mL 0  . Insulin Pen Needle (LIVE BETTER PEN NEEDLES) 31G X 6 MM MISC To use with Tresiba pens daily 100 each 3  . Lancets (ONETOUCH ULTRASOFT) lancets Use as instructed 300 each 3  . metroNIDAZOLE (FLAGYL) 500 MG tablet Take 1 tablet (500 mg total) by mouth 2 (two) times daily. 14 tablet 0  . multivitamin (RENA-VIT) TABS tablet Take 1 tablet by mouth daily.    Glory Rosebush ULTRA test strip USE AS DIRECTED TO MONITOR  FSBS 3 TIMES DAILY 300 strip 3  . PROAIR HFA 108 (90 Base) MCG/ACT inhaler INHALE 2 PUFFS BY MOUTH EVERY 6 HOURS AS NEEDED FOR WHEEZING FOR SHORTNESS OF BREATH (Patient taking differently: Inhale 2 puffs into the lungs every 6 (six) hours as needed for wheezing or shortness of breath. ) 9 g 0  . rosuvastatin (CRESTOR) 20 MG tablet Take 1 tablet (20 mg total) by mouth daily. 90 tablet 3  . oxyCODONE-acetaminophen (PERCOCET) 5-325 MG tablet Take 1 tablet by mouth every 6 (six) hours as needed for severe pain. (Patient not taking: Reported on 03/01/2020) 30 tablet 0   Current Facility-Administered Medications on File Prior to Visit  Medication Dose Route Frequency Provider Last Rate Last Admin  . Bevacizumab (AVASTIN) SOLN 1.25 mg  1.25  mg Intravitreal  [name], MD   1.25 mg at 05/12/18 1319  . Bevacizumab (AVASTIN) SOLN 1.25 mg  1.25 mg Intravitreal  [name], MD   1.25 mg at 06/13/18 0055  . Bevacizumab (AVASTIN) SOLN 1.25 mg  1.25 mg Intravitreal  [name], MD   1.25 mg at 07/10/18 1308  . Bevacizumab (AVASTIN) SOLN 1.25 mg  1.25 mg Intravitreal  [name], MD   1.25 mg at 08/21/18 1312  . Bevacizumab (AVASTIN) SOLN 1.25 mg  1.25 mg Intravitreal  [name], MD   1.25 mg at 10/02/18 1510   Allergies  Allergen Reactions  . Penicillins Other (See Comments)    UNSPECIFIED CHILDHOOD REACTION  Has patient had a PCN reaction causing immediate rash, facial/tongue/throat swelling,  SOB or lightheadedness with hypotension: Unknown Has patient had a PCN reaction causing severe rash involving mucus membranes or skin necrosis: Unknown Has patient had a PCN reaction that required hospitalization: Unknown Has patient had a PCN reaction occurring within the last 10 years: Unknown If all of the above answers are "NO", then may proceed with Cephalosporin use.   . Adhesive [Tape] Rash   Social History   Socioeconomic History  . Marital status: Single    Spouse name: Not on file  . Number of children: 2  . Years of education: 44  . Highest education level: Not on file  Occupational History  . Not on file  Tobacco Use  . Smoking status: Never Smoker  . Smokeless tobacco: Never Used  Vaping Use  . Vaping Use: Never used  Substance and Sexual Activity  . Alcohol use: Yes    Alcohol/week: 0.0 standard drinks    Comment: occ  . Drug use: No  . Sexual activity: Not Currently    Partners: Male  Other Topics Concern  . Not on file  Social History Narrative  . Not on file   Social Determinants of Health   Financial Resource Strain: Low Risk   . Difficulty of Paying Living Expenses: Not very hard  Food Insecurity: No Food Insecurity  . Worried About Charity fundraiser in the Last Year: Never true  .  Ran Out of Food in the Last Year: Never true  Transportation Needs: No Transportation Needs  . Lack of Transportation (Medical): No  . Lack of Transportation (Non-Medical): No  Physical Activity:   . Days of Exercise per Week: Not on file  . Minutes of Exercise per Session: Not on file  Stress:   . Feeling of Stress : Not on file  Social Connections:   . Frequency of Communication with Friends and Family: Not on file  . Frequency of Social Gatherings with Friends and Family: Not on file  . Attends Religious Services: Not on file  . Active Member of Clubs or Organizations: Not on file  . Attends Archivist Meetings: Not on file  . Marital Status: Not on file  Intimate Partner Violence:   . Fear of Current or Ex-Partner: Not on file  . Emotionally Abused: Not on file  . Physically Abused: Not on file  . Sexually Abused: Not on file     Review of Systems  Gastrointestinal: Positive for abdominal pain.  All other systems reviewed and are negative.      Objective:   Physical Exam Vitals reviewed.  Constitutional:      General: She is not in acute distress.    Appearance: She is well-developed. She is not diaphoretic.  Cardiovascular:     Rate and Rhythm: Normal rate and regular rhythm.     Heart sounds: Normal heart sounds.  Pulmonary:     Effort: Pulmonary effort is normal. No respiratory distress.     Breath sounds: Normal breath sounds. No wheezing or rales.  Abdominal:     General: Bowel sounds are normal. There is no distension.     Palpations: Abdomen is soft.     Tenderness: There is no abdominal tenderness. There is no guarding or rebound.           Assessment & Plan:    Colitis - Plan: CBC with Differential/Platelet  Patient symptoms have resolved however she is not taking any antibiotics.  Therefore either the colitis spontaneously resolved or its not colitis  in the first place.  I have recommended a GI follow-up for colonoscopy to exclude  underlying colonic mass.  Patient is adamantly opposed to a colonoscopy due to previous family history of perforated colon.  Therefore she has a CT scan scheduled December 13 to reevaluate for any recurrent renal cancer.  We can await the results of this to see if the colon wall is still thickened and if so proceed with colonoscopy at that point.  Patient will consider that approach.  Meanwhile repeat CBC to rule out persistent leukocytosis.  At the present time, patient refuses colonoscopy despite the possibility of an underlying malignancy.  She understands the implications of her decision and she is comfortable not having a colonoscopy at the present time.

## 2020-05-27 DIAGNOSIS — N2581 Secondary hyperparathyroidism of renal origin: Secondary | ICD-10-CM | POA: Diagnosis not present

## 2020-05-27 DIAGNOSIS — D631 Anemia in chronic kidney disease: Secondary | ICD-10-CM | POA: Diagnosis not present

## 2020-05-27 DIAGNOSIS — D689 Coagulation defect, unspecified: Secondary | ICD-10-CM | POA: Diagnosis not present

## 2020-05-27 DIAGNOSIS — R300 Dysuria: Secondary | ICD-10-CM | POA: Diagnosis not present

## 2020-05-27 DIAGNOSIS — Z992 Dependence on renal dialysis: Secondary | ICD-10-CM | POA: Diagnosis not present

## 2020-05-27 DIAGNOSIS — N390 Urinary tract infection, site not specified: Secondary | ICD-10-CM | POA: Diagnosis not present

## 2020-05-27 DIAGNOSIS — N186 End stage renal disease: Secondary | ICD-10-CM | POA: Diagnosis not present

## 2020-05-27 LAB — CBC WITH DIFFERENTIAL/PLATELET
Absolute Monocytes: 350 cells/uL (ref 200–950)
Basophils Absolute: 42 cells/uL (ref 0–200)
Basophils Relative: 0.6 %
Eosinophils Absolute: 91 cells/uL (ref 15–500)
Eosinophils Relative: 1.3 %
HCT: 32.6 % — ABNORMAL LOW (ref 35.0–45.0)
Hemoglobin: 10.8 g/dL — ABNORMAL LOW (ref 11.7–15.5)
Lymphs Abs: 1092 cells/uL (ref 850–3900)
MCH: 31.9 pg (ref 27.0–33.0)
MCHC: 33.1 g/dL (ref 32.0–36.0)
MCV: 96.2 fL (ref 80.0–100.0)
MPV: 10.8 fL (ref 7.5–12.5)
Monocytes Relative: 5 %
Neutro Abs: 5425 cells/uL (ref 1500–7800)
Neutrophils Relative %: 77.5 %
Platelets: 237 10*3/uL (ref 140–400)
RBC: 3.39 10*6/uL — ABNORMAL LOW (ref 3.80–5.10)
RDW: 14.7 % (ref 11.0–15.0)
Total Lymphocyte: 15.6 %
WBC: 7 10*3/uL (ref 3.8–10.8)

## 2020-05-30 ENCOUNTER — Telehealth: Payer: Self-pay

## 2020-05-30 DIAGNOSIS — N2581 Secondary hyperparathyroidism of renal origin: Secondary | ICD-10-CM | POA: Diagnosis not present

## 2020-05-30 DIAGNOSIS — D689 Coagulation defect, unspecified: Secondary | ICD-10-CM | POA: Diagnosis not present

## 2020-05-30 DIAGNOSIS — N390 Urinary tract infection, site not specified: Secondary | ICD-10-CM | POA: Diagnosis not present

## 2020-05-30 DIAGNOSIS — D631 Anemia in chronic kidney disease: Secondary | ICD-10-CM | POA: Diagnosis not present

## 2020-05-30 DIAGNOSIS — Z992 Dependence on renal dialysis: Secondary | ICD-10-CM | POA: Diagnosis not present

## 2020-05-30 DIAGNOSIS — N186 End stage renal disease: Secondary | ICD-10-CM | POA: Diagnosis not present

## 2020-05-30 DIAGNOSIS — R300 Dysuria: Secondary | ICD-10-CM | POA: Diagnosis not present

## 2020-05-30 NOTE — Progress Notes (Signed)
[facility] Note  05/31/2020     CHIEF COMPLAINT Patient presents for Retina Follow Up   HISTORY OF PRESENT ILLNESS: [name] is a 69 y.o. female who presents to the clinic today for:   HPI    Retina Follow Up    Patient presents with  Diabetic Retinopathy.  Since onset it is stable.  I, the attending physician,  performed the HPI with the patient and updated documentation appropriately.          Comments    8 week follow up NPDR OU-  Pt woke up feeling dizzy today.  Normally BP is low, this morning 149/59 which is high for her.   BS meter broken A1C 6.5       Last edited by [name], MD on 05/31/2020  4:25 PM. (History)    Pt states she has been dizzy all day today,   Referring physician: [name], MD [address] [place],  [place] [address]  HISTORICAL INFORMATION:   Selected notes from the MEDICAL RECORD NUMBER Referred by Dr. [name] for concern of BRVO OS LEE: 10.03.19 (C. Weaver) [BCVA: OD: 20/30 OS: 20/50] Ocular Hx-cataracts OU, HTR OU, NPDR OU, DES PMH-DM (Z7Q: 7.3, takes Trulicity and [place]), HTN, HLD     CURRENT MEDICATIONS: Current Outpatient Medications (Ophthalmic Drugs)  Medication Sig  . Carboxymethylcellulose Sodium (THERATEARS OP) Place 1 drop into both eyes daily as needed (dry eyes).  . prednisoLONE acetate (PRED FORTE) 1 % ophthalmic suspension Place 1 drop into the left eye 4 (four) times daily for 7 days.   No current facility-administered medications for this visit. (Ophthalmic Drugs)   Current Outpatient Medications (Other)  Medication Sig  . BREO ELLIPTA 100-25 MCG/INH AEPB Inhale 1 puff by mouth once daily (Patient taking differently: Inhale 1 puff into the lungs daily. )  . diphenhydrAMINE (BENADRYL) 25 MG tablet Take 25 mg by mouth daily as needed for allergies.  Marland Kitchen gabapentin (NEURONTIN) 300 MG capsule TAKE 1 CAPSULE BY MOUTH THREE TIMES DAILY. REQUIRES OFFICE VISIT BEFORE  ANY FURTHER REFILLS CAN BE GIVEN (Patient taking differently: Take 300 mg by mouth 3 (three) times daily. )  . insulin degludec (TRESIBA FLEXTOUCH) 100 UNIT/ML FlexTouch Pen Inject 0.3 mLs (30 Units total) into the skin daily. Hold if fasting blood sugars are <130.  . metroNIDAZOLE (FLAGYL) 500 MG tablet Take 1 tablet (500 mg total) by mouth 2 (two) times daily.  . multivitamin (RENA-VIT) TABS tablet Take 1 tablet by mouth daily.  Marland Kitchen PROAIR HFA 108 (90 Base) MCG/ACT inhaler INHALE 2 PUFFS BY MOUTH EVERY 6 HOURS AS NEEDED FOR WHEEZING FOR SHORTNESS OF BREATH (Patient taking differently: Inhale 2 puffs into the lungs every 6 (six) hours as needed for wheezing or shortness of breath. )  . rosuvastatin (CRESTOR) 20 MG tablet Take 1 tablet (20 mg total) by mouth daily.  . Blood Glucose Monitoring Suppl (ONE TOUCH ULTRA 2) w/Device KIT Use to check BS BID-QID Dx:E11.9  . Insulin Pen Needle (LIVE BETTER PEN NEEDLES) 31G X 6 MM MISC To use with Tresiba pens daily  . Lancets (ONETOUCH ULTRASOFT) lancets Use as instructed  . ONETOUCH ULTRA test strip USE AS DIRECTED TO MONITOR  FSBS 3 TIMES DAILY  . oxyCODONE-acetaminophen (PERCOCET) 5-325 MG tablet Take 1 tablet by mouth every 6 (six) hours as needed for severe pain. (Patient not taking: Reported on 03/01/2020)   Current Facility-Administered Medications (Other)  Medication Route  . Bevacizumab (AVASTIN) SOLN 1.25 mg Intravitreal  . Bevacizumab (AVASTIN) SOLN 1.25 mg Intravitreal  . Bevacizumab (AVASTIN) SOLN 1.25 mg Intravitreal  . Bevacizumab (AVASTIN) SOLN 1.25 mg Intravitreal  . Bevacizumab (AVASTIN) SOLN 1.25 mg Intravitreal      REVIEW OF SYSTEMS: ROS    Positive for: Endocrine, Cardiovascular, Eyes, Respiratory   Negative for: Constitutional, Gastrointestinal, Neurological, Skin, Genitourinary, Musculoskeletal, HENT, Psychiatric, Allergic/Imm, Heme/Lymph   Last edited by Leonie Douglas, COA on 05/31/2020  1:29 PM. (History)        ALLERGIES Allergies  Allergen Reactions  . Penicillins Other (See Comments)    UNSPECIFIED CHILDHOOD REACTION  Has patient had a PCN reaction causing immediate rash, facial/tongue/throat swelling, SOB or lightheadedness with hypotension: Unknown Has patient had a PCN reaction causing severe rash involving mucus membranes or skin necrosis: Unknown Has patient had a PCN reaction that required hospitalization: Unknown Has patient had a PCN reaction occurring within the last 10 years: Unknown If all of the above answers are "NO", then may proceed with Cephalosporin use.   . Adhesive [Tape] Rash    PAST MEDICAL HISTORY Past Medical History:  Diagnosis Date  . Anemia   . Anemia associated with chronic renal failure   . Anemia in chronic kidney disease 09/29/2018  . Arthritis   . Asthma   . Blood transfusion without reported diagnosis    Phreesia 02/27/2020  . Cancer ([place])    Phreesia 02/27/2020  . Cataract    OD  . Chronic kidney disease    Phreesia 02/27/2020  . Coronary artery calcification seen on CAT scan 02/17/2018   Coronary calcification on CT  . Depression   . Diabetes mellitus without complication ([place])    Phreesia 02/27/2020  . Diabetic retinopathy of both eyes ([place])   . Diabetic ulcer of right foot associated with type 2 diabetes mellitus ([place])   . ESRD (end stage renal disease) on dialysis ()   . Essential hypertension 02/17/2018   Essential hypertension  . GERD (gastroesophageal reflux disease)    pt denies  . HLD (hyperlipidemia)   . Hypertension   . Hypertensive retinopathy    OU  . Left ventricular dysfunction 04/07/2018   Left ventricle dysfunction  . Microalbuminuria due to type 2 diabetes mellitus ([place])   . Neuromuscular disorder ([place])    diabetic neuropathy  . Nonproliferative retinopathy due to secondary diabetes ([place])   . Nonsustained ventricular tachycardia ([place]) 08/28/2018   Nonsustained ventricular tachycardia  . Pneumonia    hx of   .  Renal cell cancer ([place])   . Right renal mass 03/10/2017  . Type 2 diabetes, controlled, with neuropathy ([place]) 06/22/2013  . Ulcer of other part of foot 06/22/2013  . 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 AMPUTATION TOE Right 10/29/2019   Procedure: RIGHT 2ND TOE AMPUTATION;  Surgeon: Edrick Kins, DPM;  Location: [facility];  Service: Podiatry;  Laterality: Right;  . BASCILIC VEIN TRANSPOSITION Left 08/08/2017   Procedure: BASILIC VEIN TRANSPOSITION FIRST STAGE LEFT ARM;  Surgeon: Serafina Mitchell, MD;  Location: [facility];  Service: Vascular;  Laterality: Left;  . BASCILIC VEIN TRANSPOSITION Left 05/14/2018   Procedure: SECOND STAGE BASILIC VEIN TRANSPOSITION LEFT ARM;  Surgeon: Serafina Mitchell, MD;  Location: [facility];  Service: Vascular;  Laterality: Left;  . CATARACT EXTRACTION Left   . EYE SURGERY    . ROBOTIC ADRENALECTOMY Left  03/10/2017   Procedure: XI ROBOTIC ADRENALECTOMY;  Surgeon: Nickie Retort, MD;  Location: [facility];  Service: Urology;  Laterality: Left;  . ROBOTIC ASSITED PARTIAL NEPHRECTOMY Right 03/10/2017   Procedure: XI ROBOTIC ASSITED RADICAL NEPHRECTOMY;  Surgeon: Nickie Retort, MD;  Location: [facility];  Service: Urology;  Laterality: Right;  . TOE AMPUTATION Bilateral    both great toe ,, left foot 2nd toe 1/2    FAMILY HISTORY Family History  Problem Relation Age of Onset  . Heart disease Mother   . Diabetes Sister     SOCIAL HISTORY Social History   Tobacco Use  . Smoking status: Never Smoker  . Smokeless tobacco: Never Used  Vaping Use  . Vaping Use: Never used  Substance Use Topics  . Alcohol use: Yes    Alcohol/week: 0.0 standard drinks    Comment: occ  . Drug use: No         OPHTHALMIC EXAM:  Base Eye Exam    Visual Acuity (Snellen - Linear)      Right Left   Dist [place] 20/25 +1 20/25   Dist ph [place] 20/20 20/20 -2       Tonometry (Tonopen, 1:37 PM)      Right Left    Pressure 14 15       Pupils      Dark Light Shape React APD   Right 3 2 Round Brisk None   Left 3 2 Round Brisk None       Visual Fields (Counting fingers)      Left Right    Full Full       Extraocular Movement      Right Left    Full Full       Neuro/Psych    Oriented x3: Yes   Mood/Affect: Normal       Dilation    Both eyes: 1.0% Mydriacyl, 2.5% Phenylephrine @ 1:37 PM        Slit Lamp and Fundus Exam    Slit Lamp Exam      Right Left   Lids/Lashes Dermatochalasis - upper lid, mild MGD Dermatochalasis - upper lid, Telangiectasia, mild MGD   Conjunctiva/Sclera White and quiet White and quiet   Cornea 1+ Punctate epithelial erosions, mild Arcus Well-healed temporal cataract wound   Anterior Chamber Deep and quiet Deep   Iris Round and moderately dilated to 5.20m Round and well dilated   Lens PCIOL in good position PCIOL in good position   Vitreous Vitreous syneresis, Posterior vitreous detachment Vitreous syneresis       Fundus Exam      Right Left   Disc Mild Pallor, Sharp rim trace pallor, Sharp rim   C/D Ratio 0.3 0.3   Macula Blunted foveal reflex, scattered MA and IRH greatest inferiorly, trace cystic changes inf/nasal macula Flat, good foveal reflex, RPE mottling and clumping; +cystic changes sup nasal macula with focal IRH   Vessels Vascular attenuation, Tortuous, AV crossing changes Tortuous, Vascular attenuation, AV crossing changes   Periphery Attached, scattered IRH/DBH greatest posteriorly and small CWS nasal to disc - resolved Attached, scattered DBH          IMAGING AND PROCEDURES  Imaging and Procedures for @TODAY @  OCT, Retina - OU - Both Eyes       Right Eye Quality was good. Central Foveal Thickness: 259. Progression has been stable. Findings include normal foveal contour, no SRF, no IRF (Trace focal cystic changes inf nasal macula -- improved).  Left Eye Quality was good. Central Foveal Thickness: 251. Progression has been  stable. Findings include normal foveal contour, intraretinal fluid, no SRF, vitreomacular adhesion , intraretinal hyper-reflective material (Persistent cystic changes sup nasal macula -- slightly increased).   Notes *Images captured and stored on drive  Diagnosis / Impression:  NFP; +IRF; no SRF OU noncentral DME OU (OS > OD) OD - Trace focal cystic changes inf nasal macula -- improved OS - Persistent cystic changes sup/nasal macula   Clinical management:  See below  Abbreviations: NFP - Normal foveal profile. CME - cystoid macular edema. PED - pigment epithelial detachment. IRF - intraretinal fluid. SRF - subretinal fluid. EZ - ellipsoid zone. ERM - epiretinal membrane. ORA - outer retinal atrophy. ORT - outer retinal tubulation. SRHM - subretinal hyper-reflective material        Focal Laser - OS - Left Eye       LASER PROCEDURE NOTE  Diagnosis:   Diabetic macular edema, left eye   Procedure:  Focal laser photocoagulation using slit lamp laser, left eye   Anesthesia:  Topical  Surgeon: [name], MD, PhD   Informed consent obtained, operative eye marked, and time out performed prior to initiation of laser.   Lumenis [id number] Focal/Grid laser Power: 80 mW Duration: 50 msec  Spot size: 100 microns  # spots: 89 spots placed to MAs nasal macula.  Complications: None.  RTC: 6 wks  Patient tolerated the procedure well and received written and verbal post-procedure care information/education.                   ASSESSMENT/PLAN:    ICD-10-CM   1. Moderate nonproliferative diabetic retinopathy of both eyes with macular edema associated with type 2 diabetes mellitus (HCC)  X61.4709 Focal Laser - OS - Left Eye  2. Retinal edema  H35.81 OCT, Retina - OU - Both Eyes  3. Essential hypertension  I10   4. Hypertensive retinopathy of both eyes  H35.033   5. Pseudophakia of both eyes  Z96.1     1,2 Moderate non-proliferative diabetic retinopathy w/ DME OU (OS >  OD)  - Initial exam shows scattered MA, BCVA 20/50 OS  - Initial FA (05/12/18) shows leaking MA, no NV OS  - Initial OCT showed diabetic macular edema OU (OS > OD)   - s/p IVA #1 OS (10.22.19), #2 (11.20.19), #3 (12.20.19), #4 (01.31.20), #5 (03.13.20), #6 (04.22.20), #7 (06.05.20), #8 (07.17.20), #9 (08.28.20),#10 (10.07.20), #11 (12.04.20), #12 (02.21.21), #13 (05.12.21), #14 (07.21.21), #15 (09.15.21)  - today BCVA stable at 20/20 OU  - OCT OD - Trace focal cystic changes inf nasal macula -- improved; OS - Persistent cystic changes sup/nasal macula   - exam shows nice MA targets for focal laser OS -- nasal macula  - recommend focal laser OS today, 11.10.21  - pt wishes to proceed with laser  - RBA of procedure discussed, questions answered  - informed consent obtained  - see procedure note  - Avastin informed consent form signed and scanned on 12.04.2020  - f/u in 6 wks, DFE, OCT, possible injxn(s)   3,4. Hypertensive retinopathy OU  - discussed importance of tight BP control  - monitor  5. Pseudophakia OU  - s/p CE/IOL OU w/ Dr. [name] (OS 2020;  OD 5.19.21)  - beautiful surgeries, doing well  - monitor   Ophthalmic Meds Ordered this visit:  Meds ordered this encounter  Medications  . prednisoLONE acetate (PRED FORTE) 1 % ophthalmic suspension  Sig: Place 1 drop into the left eye 4 (four) times daily for 7 days.    Dispense:  10 mL    Refill:  0       Return in about 6 weeks (around 07/12/2020) for f/u NPDR OU, DFE, OCT.  There are no Patient Instructions on file for this visit.   Explained the diagnoses, plan, and follow up with the patient and they expressed understanding.  Patient expressed understanding of the importance of proper follow up care.   This document serves as a record of services personally performed by Gardiner Sleeper, MD, PhD. It was created on their behalf by Roselee Nova, COMT. The creation of this record is the provider's dictation and/or  activities during the visit.  Electronically signed by: Roselee Nova, COMT 05/31/20 4:30 PM   This document serves as a record of services personally performed by Gardiner Sleeper, MD, PhD. It was created on their behalf by San Jetty. Owens Shark, OA an ophthalmic technician. The creation of this record is the provider's dictation and/or activities during the visit.    Electronically signed by: San Jetty. [place], [place] 11.10.2021 4:30 PM   Gardiner Sleeper, M.D., Ph.D. Diseases & Surgery of the Retina and Vitreous [facility]  I have reviewed the above documentation for accuracy and completeness, and I agree with the above. Gardiner Sleeper, M.D., Ph.D. 05/31/20 4:30 PM   Abbreviations: M myopia (nearsighted); A astigmatism; H hyperopia (farsighted); P presbyopia; Mrx spectacle prescription;  CTL contact lenses; OD right eye; OS left eye; OU both eyes  XT exotropia; ET esotropia; PEK punctate epithelial keratitis; PEE punctate epithelial erosions; DES dry eye syndrome; MGD meibomian gland dysfunction; ATs artificial tears; PFAT's preservative free artificial tears; [place] nuclear sclerotic cataract; PSC posterior subcapsular cataract; ERM epi-retinal membrane; PVD posterior vitreous detachment; RD retinal detachment; DM diabetes mellitus; DR diabetic retinopathy; NPDR non-proliferative diabetic retinopathy; PDR proliferative diabetic retinopathy; CSME clinically significant macular edema; DME diabetic macular edema; dbh dot blot hemorrhages; CWS cotton wool spot; POAG primary open angle glaucoma; C/D cup-to-disc ratio; HVF humphrey visual field; GVF goldmann visual field; OCT optical coherence tomography; IOP intraocular pressure; BRVO Branch retinal vein occlusion; CRVO central retinal vein occlusion; CRAO central retinal artery occlusion; BRAO branch retinal artery occlusion; RT retinal tear; SB scleral buckle; PPV pars plana vitrectomy; VH Vitreous hemorrhage; PRP panretinal laser  photocoagulation; IVK intravitreal kenalog; VMT vitreomacular traction; MH Macular hole;  NVD neovascularization of the disc; NVE neovascularization elsewhere; AREDS age related eye disease study; ARMD age related macular degeneration; POAG primary open angle glaucoma; EBMD epithelial/anterior basement membrane dystrophy; ACIOL anterior chamber intraocular lens; IOL intraocular lens; PCIOL posterior chamber intraocular lens; Phaco/IOL phacoemulsification with intraocular lens placement; [place] photorefractive keratectomy; LASIK laser assisted in situ keratomileusis; HTN hypertension; DM diabetes mellitus; COPD chronic obstructive pulmonary disease

## 2020-05-30 NOTE — Telephone Encounter (Signed)
Pt called for lab results, Pt verbalized understanding of lab results given. 

## 2020-05-31 ENCOUNTER — Encounter (INDEPENDENT_AMBULATORY_CARE_PROVIDER_SITE_OTHER): Payer: Self-pay | Admitting: Ophthalmology

## 2020-05-31 ENCOUNTER — Other Ambulatory Visit: Payer: Self-pay

## 2020-05-31 ENCOUNTER — Encounter (HOSPITAL_BASED_OUTPATIENT_CLINIC_OR_DEPARTMENT_OTHER): Payer: Medicare Other | Admitting: Physician Assistant

## 2020-05-31 ENCOUNTER — Ambulatory Visit (INDEPENDENT_AMBULATORY_CARE_PROVIDER_SITE_OTHER): Payer: Medicare Other | Admitting: Ophthalmology

## 2020-05-31 DIAGNOSIS — Z961 Presence of intraocular lens: Secondary | ICD-10-CM

## 2020-05-31 DIAGNOSIS — H3581 Retinal edema: Secondary | ICD-10-CM | POA: Diagnosis not present

## 2020-05-31 DIAGNOSIS — L97822 Non-pressure chronic ulcer of other part of left lower leg with fat layer exposed: Secondary | ICD-10-CM | POA: Diagnosis not present

## 2020-05-31 DIAGNOSIS — E11621 Type 2 diabetes mellitus with foot ulcer: Secondary | ICD-10-CM | POA: Diagnosis not present

## 2020-05-31 DIAGNOSIS — I10 Essential (primary) hypertension: Secondary | ICD-10-CM | POA: Diagnosis not present

## 2020-05-31 DIAGNOSIS — L97812 Non-pressure chronic ulcer of other part of right lower leg with fat layer exposed: Secondary | ICD-10-CM | POA: Diagnosis not present

## 2020-05-31 DIAGNOSIS — I872 Venous insufficiency (chronic) (peripheral): Secondary | ICD-10-CM | POA: Diagnosis not present

## 2020-05-31 DIAGNOSIS — E11622 Type 2 diabetes mellitus with other skin ulcer: Secondary | ICD-10-CM | POA: Diagnosis not present

## 2020-05-31 DIAGNOSIS — H35033 Hypertensive retinopathy, bilateral: Secondary | ICD-10-CM

## 2020-05-31 DIAGNOSIS — L97522 Non-pressure chronic ulcer of other part of left foot with fat layer exposed: Secondary | ICD-10-CM | POA: Diagnosis not present

## 2020-05-31 DIAGNOSIS — E113313 Type 2 diabetes mellitus with moderate nonproliferative diabetic retinopathy with macular edema, bilateral: Secondary | ICD-10-CM

## 2020-05-31 DIAGNOSIS — N186 End stage renal disease: Secondary | ICD-10-CM | POA: Diagnosis not present

## 2020-05-31 DIAGNOSIS — E1140 Type 2 diabetes mellitus with diabetic neuropathy, unspecified: Secondary | ICD-10-CM | POA: Diagnosis not present

## 2020-05-31 DIAGNOSIS — L97312 Non-pressure chronic ulcer of right ankle with fat layer exposed: Secondary | ICD-10-CM | POA: Diagnosis not present

## 2020-05-31 DIAGNOSIS — I120 Hypertensive chronic kidney disease with stage 5 chronic kidney disease or end stage renal disease: Secondary | ICD-10-CM | POA: Diagnosis not present

## 2020-05-31 DIAGNOSIS — Z992 Dependence on renal dialysis: Secondary | ICD-10-CM | POA: Diagnosis not present

## 2020-05-31 DIAGNOSIS — E1122 Type 2 diabetes mellitus with diabetic chronic kidney disease: Secondary | ICD-10-CM | POA: Diagnosis not present

## 2020-05-31 DIAGNOSIS — L97512 Non-pressure chronic ulcer of other part of right foot with fat layer exposed: Secondary | ICD-10-CM | POA: Diagnosis not present

## 2020-05-31 MED ORDER — PREDNISOLONE ACETATE 1 % OP SUSP: 1.0000 [drp] | Freq: Four times a day (QID) | OPHTHALMIC | 0 refills | Status: AC

## 2020-05-31 NOTE — Progress Notes (Addendum)
CENA, BRUHN ([id number]) Visit Report for 05/31/2020 Chief Complaint Document Details Patient Name: Date of Service: [name] [name], [name] 05/31/2020 10:15 A M Medical Record Number: [id number] Patient Account Number: [id number] Date of Birth/Sex: Treating RN: 14-Nov-1950 (69 y.o. [name] Primary Care Provider: [name] Other Clinician: Referring Provider: Treating Provider/Extender: Christella Hartigan in Treatment: 16 Information Obtained from: Patient Chief Complaint Multiple LE Ulcers Electronic Signature(s) Signed: 05/31/2020 10:33:44 AM By: [name] PA-C Entered By: [name] on 05/31/2020 10:33:43 -------------------------------------------------------------------------------- Debridement Details Patient Name: Date of Service: [facility] [name], [place] [name] 05/31/2020 10:15 A M Medical Record Number: [id number] Patient Account Number: [id number] Date of Birth/Sex: Treating RN: [birth year] (69 y.o. [name] Primary Care Provider: [name] Other Clinician: Referring Provider: Treating Provider/Extender: Christella Hartigan in Treatment: 16 Debridement Performed for Assessment: Wound #8 Left T Third oe Performed By: Physician [name], PA Debridement Type: Debridement Severity of Tissue Pre Debridement: Fat layer exposed Level of Consciousness (Pre-procedure): Awake and Alert Pre-procedure Verification/Time Out Yes - 10:50 Taken: Start Time: 10:50 T Area Debrided (L x W): otal 0.4 (cm) x 0.4 (cm) = 0.16 (cm) Tissue and other material debrided: Viable, Non-Viable, Slough, Subcutaneous, Slough Level: Skin/Subcutaneous Tissue Debridement Description: Excisional Instrument: Curette Bleeding: Minimum Hemostasis Achieved: Pressure End Time: 10:52 Procedural Pain: 0 Post Procedural Pain: 0 Response to Treatment: Procedure was tolerated well Level of Consciousness (Post- Awake and  Alert procedure): Post Debridement Measurements of Total Wound Length: (cm) 0.4 Width: (cm) 0.4 Depth: (cm) 0.2 Volume: (cm) 0.025 Character of Wound/Ulcer Post Debridement: Improved Severity of Tissue Post Debridement: Fat layer exposed Post Procedure Diagnosis Same as Pre-procedure Electronic Signature(s) Signed: 05/31/2020 5:47:40 PM By: [name] PA-C Signed: 05/31/2020 5:57:36 PM By: [name] RN, BSN Entered By: [name] on 05/31/2020 10:53:48 -------------------------------------------------------------------------------- HPI Details Patient Name: Date of Service: [name], [place] [name] 05/31/2020 10:15 A M Medical Record Number: [id number] Patient Account Number: [id number] Date of Birth/Sex: Treating RN: 07-15-1951 (69 y.o. [name] Primary Care Provider: [name] Other Clinician: Referring Provider: Treating Provider/Extender: Christella Hartigan in Treatment: 16 History of Present Illness HPI Description: Evaluate7/21/2021 today patient presents for evaluation of ulcerations on her legs which she tells me tend to come and go as far as small blistering locations and sometimes are better than other times. Right now she tells me that this is actually doing a little bit better but nonetheless will not completely go away. She has ulcerations bilaterally on her lower extremities. The right is worse than the left. 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 The patient has no evidence of active infection at this time. She however appears to have proficient blood flow into the extremities and I think would tolerate a 3 layer compression wrap she was noncompressible on arterial studies. Nonetheless there was no obvious signs of occlusion. 02/16/2020 on evaluation today patient appears to be doing much better in regard to  her wounds in general. On the past week she has made significant improvements which is great news there is no signs of active infection at this time. No fevers, chills, nausea, vomiting, or diarrhea. 02/23/2020 on evaluation today patient appears to be doing well in regard to her lower extremity ulcers and ankle ulcers. Overall I feel like she is making great  progress and in general I am extremely pleased with how things stand. There is no sign of active infection at this time which is also good news. She will require slight debridement around the right ankle region. 03/02/20-Patient returns at 1 week, has few new areas open on the left leg, especially 1 healed area on the medial ankle that has slight small open area now, to anterior leg areas that started out as blisters that are open now. She was using juxta lite compression to the left, she used Band-Aid to cover the vesicle that had ruptured on the left anterior leg, we are using 3 layer compression on the right. We are using silver alginate to the toe wound on the left. 03/08/2020 upon evaluation today patient unfortunately is doing much worse in regard to her right leg although her left leg looks like is doing better. Again we have taken her compression wraps off last week since she was doing well and transitioned her to her Velcro compression wrap. However apparently the patient is stating this was not put on here in the clinic and she never put it on at home. With that being said unfortunately she developed swelling and opened up new wounds which are present today she has quite a few of them in fact. 03/15/2020 on evaluation today patient appears to be doing well with regard to her wounds on the lower extremities bilaterally. Fortunately there is no signs of active infection at this time. No fever chills noted. Overall I am actually very pleased with where things stand currently. 03/22/2020 on evaluation today patient appears to be doing fairly well  in regard to her wounds in general. She just has really one area left on her toe everything else is healed on her legs. She does have her Velcro compression wraps which I think are appropriate at this point. Fortunately there is no signs of active infection at this time. No fevers, chills, nausea, vomiting, or diarrhea. 03/29/2020 on evaluation today patient appears to be doing poorly in regard to her right leg which is pretty much completely reopened compared to last week. She states it was okay until Saturday when she woke up she had blisters. With that being said it appears that this is likely stemming from the fact that the patient is apparently though I just found this out today going to sleep in her bed with her legs up on the bed with her but throughout the night she tends to end up with her legs hanging off the side or bottom of the bed on the floor. With that being said I think that this likely is happening much too long and then she has significant swelling the builds up as she sleeps. Obviously I think this is the reasoning behind what we are seeing currently. 04/05/2020 on evaluation today patient appears to be doing quite well with regard to her leg ulcers at this point. Fortunately there is no signs of anything significantly open again which is great news nonetheless I am to continue wrapping her for at least a couple of weeks due to the fact that she seems to continually reopen as soon as we unwrapped her. 04/12/2020 upon evaluation today patient appears to be doing excellent in regard to her leg ulcers all appear to be improving which is great news. She is also doing better in regard to the toe the infection seems to be under better control and the Santyl is helping to clean this up. Overall very pleased much  more so than what I saw last week. 04/19/2020 on evaluation today patient appears to be doing well with regard to her leg ulcers which are showing no signs of opening at this point. Her  toe is also showing signs of improvement she is about done with her antibiotic. Overall I feel like things are doing quite well. 04/26/2020 upon evaluation today patient appears to be doing unfortunately a little bit worse compared to previous. She has reopened wounds on the right anterior lower extremity. This is unfortunate as we were hoping that this would really maintain healing. That does not appear to be the case. I think with him the need to see about making a referral for venous studies and provider evaluation with [facility] imaging with the interventional radiologist. 08/11/2019 upon evaluation today patient appears to be doing well in general in regard to her legs. Her right leg she does have a new wound that opened since last week. On the left leg everything appears to be healed she does still have the opening on her toe which we are managing but again this does not appear to be any worse. She just needs to get this to feeling and cover over. There is no evidence of infection which is at least great news. 05/17/2020 upon evaluation today patient appears to be doing decently well in regard to her leg ulcers this is good news. She does have still an opening on the left second toe. She has been let this dry out that not really what we want to see currently. For that reason I discussed with her again the fact that she needs to keep this covered and not allow it to dry out in order to allow for appropriate healing. She voiced understanding and states she did not know that. 05/24/2020 upon evaluation today patient's legs both appear to be still be completely healed which is great news. We are now going to transition into having her utilize her juxta lite compression wraps on the bilateral lower extremities. She is definitely ready for this. Her toe also seems to be doing better. 05/31/2020 on evaluation today patient appears to be doing well with regard to her legs. There is nothing open at this  point. Her toe still has some slough noted and get a clean that off today by way of sharp debridement other than that I feel like the collagen is probably still her best bet in that regard. Electronic Signature(s) Signed: 05/31/2020 10:54:35 AM By: [name] PA-C Entered By: [name] on 05/31/2020 10:54:35 -------------------------------------------------------------------------------- Physical Exam Details Patient Name: Date of Service: [facility] [name], [place] [name] 05/31/2020 10:15 A M Medical Record Number: [id number] Patient Account Number: [id number] Date of Birth/Sex: Treating RN: 04/02/1951 (69 y.o. [name] Primary Care Provider: [name] Other Clinician: Referring Provider: Treating Provider/Extender: Christella Hartigan in Treatment: 46 Constitutional Well-nourished and well-hydrated in no acute distress. Respiratory normal breathing without difficulty. Psychiatric this patient is able to make decisions and demonstrates good insight into disease process. Alert and Oriented x 3. pleasant and cooperative. Notes Upon inspection patient's wound bed actually showed signs of good granulation at this time in regard to the toe and some areas of there was some slough noted I did perform sharp debridement to clear away necrotic tissue from the surface of the wound she tolerated that today without complication post debridement the wound bed appears to be doing better there was minimal bleeding noted. Electronic Signature(s) Signed: 05/31/2020  10:54:55 AM By: [name] PA-C Entered By: [name] on 05/31/2020 10:54:55 -------------------------------------------------------------------------------- Physician Orders Details Patient Name: Date of Service: [facility] [name], [place] [name] 05/31/2020 10:15 A M Medical Record Number: [id number] Patient Account Number: [id number] Date of Birth/Sex: Treating RN: 07-Sep-1950 (69 y.o. [name] Primary  Care Provider: [name] Other Clinician: Referring Provider: Treating Provider/Extender: Christella Hartigan in Treatment: [phone] Verbal / Phone Orders: No Diagnosis Coding ICD-10 Coding Code Description I87.2 Venous insufficiency (chronic) (peripheral) E11.621 Type 2 diabetes mellitus with foot ulcer L97.822 Non-pressure chronic ulcer of other part of left lower leg with fat layer exposed L97.312 Non-pressure chronic ulcer of right ankle with fat layer exposed L97.812 Non-pressure chronic ulcer of other part of right lower leg with fat layer exposed L97.512 Non-pressure chronic ulcer of other part of right foot with fat layer exposed N18.6 End stage renal disease Z99.2 Dependence on renal dialysis I10 Essential (primary) hypertension Follow-up Appointments Return Appointment in 1 week. Dressing Change Frequency Wound #8 Left T Third oe Change Dressing every other day. Skin Barriers/Peri-Wound Care Moisturizing lotion - to both legs daily Wound Cleansing Wound #8 Left T Third oe May shower and wash wound with soap and water. Primary Wound Dressing Wound #8 Left T Third oe Silver Collagen - moisten with saline Secondary Dressing Wound #8 Left T Third oe Kerlix/Rolled Gauze Dry Gauze Edema Control Avoid standing for long periods of time Elevate legs to the level of the heart or above for 30 minutes daily and/or when sitting, a frequency of: - throughout the day Exercise regularly Support Garment 20-30 mm/Hg pressure to: - juxtalite compression garments both legs daily Electronic Signature(s) Signed: 05/31/2020 5:47:40 PM By: [name] PA-C Signed: 05/31/2020 5:57:36 PM By: [name] RN, BSN Entered By: [name] on 05/31/2020 10:55:01 -------------------------------------------------------------------------------- Problem List Details Patient Name: Date of Service: [facility] [name], [place] [name] 05/31/2020 10:15 A M Medical Record Number:  [id number] Patient Account Number: [id number] Date of Birth/Sex: Treating RN: September 23, 1950 (69 y.o. [name] Primary Care Provider: [name] Other Clinician: Referring Provider: Treating Provider/Extender: Christella Hartigan in Treatment: [phone] Active Problems ICD-10 Encounter Code Description Active Date MDM Diagnosis I87.2 Venous insufficiency (chronic) (peripheral) 02/09/2020 No Yes E11.621 Type 2 diabetes mellitus with foot ulcer 02/09/2020 No Yes L97.522 Non-pressure chronic ulcer of other part of left foot with fat layer exposed 05/31/2020 No Yes N18.6 End stage renal disease 02/09/2020 No Yes Z99.2 Dependence on renal dialysis 02/09/2020 No Yes I10 Essential (primary) hypertension 02/09/2020 No Yes Inactive Problems Resolved Problems ICD-10 Code Description Active Date Resolved Date L97.822 Non-pressure chronic ulcer of other part of left lower leg with fat layer exposed 02/09/2020 02/09/2020 L97.312 Non-pressure chronic ulcer of right ankle with fat layer exposed 02/09/2020 02/09/2020 L97.812 Non-pressure chronic ulcer of other part of right lower leg with fat layer exposed 02/09/2020 02/09/2020 L97.512 Non-pressure chronic ulcer of other part of right foot with fat layer exposed 02/09/2020 02/09/2020 Electronic Signature(s) Signed: 05/31/2020 10:56:41 AM By: [name] PA-C Previous Signature: 05/31/2020 10:33:35 AM Version By: [name] PA-C Entered By: [name] on 05/31/2020 10:56:41 -------------------------------------------------------------------------------- Progress Note Details Patient Name: Date of Service: [name], [place] [name] 05/31/2020 10:15 A M Medical Record Number: [id number] Patient Account Number: [id number] Date of Birth/Sex: Treating RN: 04-12-1951 (69 y.o. [name] Primary Care Provider: [name] Other Clinician: Referring Provider: Treating Provider/Extender: Marilynn Latino  Weeks  in Treatment: 16 Subjective Chief Complaint Information obtained from Patient Multiple LE Ulcers History of Present Illness (HPI) Evaluate7/21/2021 today patient presents for evaluation of ulcerations on her legs which she tells me tend to come and go as far as small blistering locations and sometimes are better than other times. Right now she tells me that this is actually doing a little bit better but nonetheless will not completely go away. She has ulcerations bilaterally on her lower extremities. The right is worse than the left. 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 The patient has no evidence of active infection at this time. She however appears to have proficient blood flow into the extremities and I think would tolerate a 3 layer compression wrap she was noncompressible on arterial studies. Nonetheless there was no obvious signs of occlusion. 02/16/2020 on evaluation today patient appears to be doing much better in regard to her wounds in general. On the past week she has made significant improvements which is great news there is no signs of active infection at this time. No fevers, chills, nausea, vomiting, or diarrhea. 02/23/2020 on evaluation today patient appears to be doing well in regard to her lower extremity ulcers and ankle ulcers. Overall I feel like she is making great progress and in general I am extremely pleased with how things stand. There is no sign of active infection at this time which is also good news. She will require slight debridement around the right ankle region. 03/02/20-Patient returns at 1 week, has few new areas open on the left leg, especially 1 healed area on the medial ankle that has slight small open area now, to anterior leg areas that started out as blisters that are open now. She was using juxta lite compression to the left, she used  Band-Aid to cover the vesicle that had ruptured on the left anterior leg, we are using 3 layer compression on the right. We are using silver alginate to the toe wound on the left. 03/08/2020 upon evaluation today patient unfortunately is doing much worse in regard to her right leg although her left leg looks like is doing better. Again we have taken her compression wraps off last week since she was doing well and transitioned her to her Velcro compression wrap. However apparently the patient is stating this was not put on here in the clinic and she never put it on at home. With that being said unfortunately she developed swelling and opened up new wounds which are present today she has quite a few of them in fact. 03/15/2020 on evaluation today patient appears to be doing well with regard to her wounds on the lower extremities bilaterally. Fortunately there is no signs of active infection at this time. No fever chills noted. Overall I am actually very pleased with where things stand currently. 03/22/2020 on evaluation today patient appears to be doing fairly well in regard to her wounds in general. She just has really one area left on her toe everything else is healed on her legs. She does have her Velcro compression wraps which I think are appropriate at this point. Fortunately there is no signs of active infection at this time. No fevers, chills, nausea, vomiting, or diarrhea. 03/29/2020 on evaluation today patient appears to be doing poorly in regard to her right leg which is pretty much completely reopened compared to last week. She states it was  okay until Saturday when she woke up she had blisters. With that being said it appears that this is likely stemming from the fact that the patient is apparently though I just found this out today going to sleep in her bed with her legs up on the bed with her but throughout the night she tends to end up with her legs hanging off the side or bottom of the bed on  the floor. With that being said I think that this likely is happening much too long and then she has significant swelling the builds up as she sleeps. Obviously I think this is the reasoning behind what we are seeing currently. 04/05/2020 on evaluation today patient appears to be doing quite well with regard to her leg ulcers at this point. Fortunately there is no signs of anything significantly open again which is great news nonetheless I am to continue wrapping her for at least a couple of weeks due to the fact that she seems to continually reopen as soon as we unwrapped her. 04/12/2020 upon evaluation today patient appears to be doing excellent in regard to her leg ulcers all appear to be improving which is great news. She is also doing better in regard to the toe the infection seems to be under better control and the Santyl is helping to clean this up. Overall very pleased much more so than what I saw last week. 04/19/2020 on evaluation today patient appears to be doing well with regard to her leg ulcers which are showing no signs of opening at this point. Her toe is also showing signs of improvement she is about done with her antibiotic. Overall I feel like things are doing quite well. 04/26/2020 upon evaluation today patient appears to be doing unfortunately a little bit worse compared to previous. She has reopened wounds on the right anterior lower extremity. This is unfortunate as we were hoping that this would really maintain healing. That does not appear to be the case. I think with him the need to see about making a referral for venous studies and provider evaluation with [facility] imaging with the interventional radiologist. 08/11/2019 upon evaluation today patient appears to be doing well in general in regard to her legs. Her right leg she does have a new wound that opened since last week. On the left leg everything appears to be healed she does still have the opening on her toe which we are  managing but again this does not appear to be any worse. She just needs to get this to feeling and cover over. There is no evidence of infection which is at least great news. 05/17/2020 upon evaluation today patient appears to be doing decently well in regard to her leg ulcers this is good news. She does have still an opening on the left second toe. She has been let this dry out that not really what we want to see currently. For that reason I discussed with her again the fact that she needs to keep this covered and not allow it to dry out in order to allow for appropriate healing. She voiced understanding and states she did not know that. 05/24/2020 upon evaluation today patient's legs both appear to be still be completely healed which is great news. We are now going to transition into having her utilize her juxta lite compression wraps on the bilateral lower extremities. She is definitely ready for this. Her toe also seems to be doing better. 05/31/2020 on evaluation today patient appears  to be doing well with regard to her legs. There is nothing open at this point. Her toe still has some slough noted and get a clean that off today by way of sharp debridement other than that I feel like the collagen is probably still her best bet in that regard. Objective Constitutional Well-nourished and well-hydrated in no acute distress. Vitals Time Taken: 10:12 AM, Height: 67 in, Weight: 202 lbs, BMI: 31.6, Temperature: 98.1 F, Pulse: 57 bpm, Respiratory Rate: 16 breaths/min, Blood Pressure: 123/75 mmHg. General Notes: blood glucose machine currently out of service per patient. Working on fixing it. Respiratory normal breathing without difficulty. Psychiatric this patient is able to make decisions and demonstrates good insight into disease process. Alert and Oriented x 3. pleasant and cooperative. General Notes: Upon inspection patient's wound bed actually showed signs of good granulation at this time in  regard to the toe and some areas of there was some slough noted I did perform sharp debridement to clear away necrotic tissue from the surface of the wound she tolerated that today without complication post debridement the wound bed appears to be doing better there was minimal bleeding noted. Integumentary (Hair, Skin) Wound #8 status is Open. Original cause of wound was Blister. The wound is located on the Left T Third. The wound measures 0.4cm length x 0.4cm width x oe 0.2cm depth; 0.126cm^2 area and 0.025cm^3 volume. There is Fat Layer (Subcutaneous Tissue) exposed. There is no tunneling or undermining noted. There is a medium amount of serosanguineous drainage noted. The wound margin is flat and intact. There is small (1-33%) red granulation within the wound bed. There is a large (67-100%) amount of necrotic tissue within the wound bed including Adherent Slough. Assessment Active Problems ICD-10 Venous insufficiency (chronic) (peripheral) Type 2 diabetes mellitus with foot ulcer Non-pressure chronic ulcer of other part of left foot with fat layer exposed End stage renal disease Dependence on renal dialysis Essential (primary) hypertension Procedures Wound #8 Pre-procedure diagnosis of Wound #8 is a Diabetic Wound/Ulcer of the Lower Extremity located on the Left T Third .Severity of Tissue Pre Debridement is: oe Fat layer exposed. There was a Excisional Skin/Subcutaneous Tissue Debridement with a total area of 0.16 sq cm performed by [name], PA. With the following instrument(s): Curette to remove Viable and Non-Viable tissue/material. Material removed includes Subcutaneous Tissue and Slough and. No specimens were taken. A time out was conducted at 10:50, prior to the start of the procedure. A Minimum amount of bleeding was controlled with Pressure. The procedure was tolerated well with a pain level of 0 throughout and a pain level of 0 following the procedure. Post Debridement  Measurements: 0.4cm length x 0.4cm width x 0.2cm depth; 0.025cm^3 volume. Character of Wound/Ulcer Post Debridement is improved. Severity of Tissue Post Debridement is: Fat layer exposed. Post procedure Diagnosis Wound #8: Same as Pre-Procedure Plan Follow-up Appointments: Return Appointment in 1 week. Dressing Change Frequency: Wound #8 Left T Third: oe Change Dressing every other day. Skin Barriers/Peri-Wound Care: Moisturizing lotion - to both legs daily Wound Cleansing: Wound #8 Left T Third: oe May shower and wash wound with soap and water. Primary Wound Dressing: Wound #8 Left T Third: oe Silver Collagen - moisten with saline Secondary Dressing: Wound #8 Left T Third: oe Kerlix/Rolled Gauze Dry Gauze Edema Control: Avoid standing for long periods of time Elevate legs to the level of the heart or above for 30 minutes daily and/or when sitting, a frequency of: - throughout the  day Exercise regularly Support Garment 20-30 mm/Hg pressure to: - juxtalite compression garments both legs daily 1. Would recommend currently that we actually go ahead and continue with the silver collagen for the toe I think this is still the best way to go at this point. 2. I am also can recommend that the patient continue to take her IV antibiotics which is actually for UTI though it cannot hurt anything in regard to the toe as well. 3. I am also can recommend at this time that the patient continue to avoid any pressure to the toe area obviously I think that is one of her biggest concerns at this time as far as healing is concerned. Number form also can recommend she continue with the bilateral juxta lites which seem to be doing a good job. We will see patient back for reevaluation in 1 week here in the clinic. If anything worsens or changes patient will contact our office for additional recommendations. Electronic Signature(s) Signed: 05/31/2020 10:56:53 AM By: [name] PA-C Previous  Signature: 05/31/2020 10:55:35 AM Version By: [name] PA-C Entered By: [name] on 05/31/2020 10:56:53 -------------------------------------------------------------------------------- SuperBill Details Patient Name: Date of Service: [name], [place] [name] 05/31/2020 Medical Record Number: [id number] Patient Account Number: [id number] Date of Birth/Sex: Treating RN: 1951-02-08 (69 y.o. [name] Primary Care Provider: [name] Other Clinician: Referring Provider: Treating Provider/Extender: Christella Hartigan in Treatment: 16 Diagnosis Coding ICD-10 Codes Code Description I87.2 Venous insufficiency (chronic) (peripheral) E11.621 Type 2 diabetes mellitus with foot ulcer L97.522 Non-pressure chronic ulcer of other part of left foot with fat layer exposed N18.6 End stage renal disease Z99.2 Dependence on renal dialysis I10 Essential (primary) hypertension Facility Procedures CPT4 Code: [id number] Description: 78588 - DEB SUBQ TISSUE 20 SQ CM/< ICD-10 Diagnosis Description L97.522 Non-pressure chronic ulcer of other part of left foot with fat layer exposed Modifier: Quantity: 1 Physician Procedures : CPT4 Code Description Modifier [id number] - WC PHYS SUBQ TISS 20 SQ CM ICD-10 Diagnosis Description L97.522 Non-pressure chronic ulcer of other part of left foot with fat layer exposed Quantity: 1 Electronic Signature(s) Signed: 05/31/2020 10:57:09 AM By: [name] PA-C Entered By: [name] on 05/31/2020 10:57:08

## 2020-05-31 NOTE — Progress Notes (Signed)
JAQUILLA, [name] ([id number]) Visit Report for 05/31/2020 Arrival Information Details Patient Name: Date of Service: [name], [name] 05/31/2020 10:15 A M Medical Record Number: [id number] Patient Account Number: [id number] Date of Birth/Sex: Treating RN: 02-16-1951 (69 y.o. [name], [name] Primary Care Jayleigh Notarianni: [name] Other Clinician: Referring Kjersti Dittmer: Treating Amoni Morales/Extender: Christella Hartigan in Treatment: 5 Visit Information History Since Last Visit Added or deleted any medications: No Patient Arrived: Wheel Chair Any new allergies or adverse reactions: No Arrival Time: 10:11 Had a fall or experienced change in No Accompanied By: self activities of daily living that may affect Transfer Assistance: None risk of falls: Patient Identification Verified: Yes Signs or symptoms of abuse/neglect since last visito No Secondary Verification Process Completed: Yes Hospitalized since last visit: No Patient Requires Transmission-Based Precautions: No Implantable device outside of the clinic excluding No Patient Has Alerts: Yes cellular tissue based products placed in the center Patient Alerts: Right ABI: [place] since last visit: Left ABI: [place] Has Dressing in Place as Prescribed: No Has Compression in Place as Prescribed: Yes Pain Present Now: No Electronic Signature(s) Signed: 05/31/2020 5:59:08 PM By: [name] Entered By: [name] on 05/31/2020 10:17:01 -------------------------------------------------------------------------------- Lower Extremity Assessment Details Patient Name: Date of Service: [facility] [name] [name], [name] 05/31/2020 10:15 A M Medical Record Number: [id number] Patient Account Number: [id number] Date of Birth/Sex: Treating RN: 01/23/1951 (69 y.o. [name] Primary Care Juleen Sorrels: [name] Other Clinician: Referring Camilia Caywood: Treating Nameer Summer/Extender: Christella Hartigan in Treatment: 16 Edema  Assessment Assessed: [name]: Yes] Patrice Paradise: No] Edema: [Left: No] [Right: No] Calf Left: Right: Point of Measurement: 38 cm From Medial Instep 34.5 cm Ankle Left: Right: Point of Measurement: 13 cm From Medial Instep 22.5 cm Vascular Assessment Pulses: Dorsalis Pedis Palpable: [Left:Yes] Electronic Signature(s) Signed: 05/31/2020 5:59:08 PM By: [name] Entered By: [name] on 05/31/2020 10:18:16 -------------------------------------------------------------------------------- [place] Details Patient Name: Date of Service: [facility] [name], [place] [name] 05/31/2020 10:15 A M Medical Record Number: [id number] Patient Account Number: [id number] Date of Birth/Sex: Treating RN: [birth year] (69 y.o. [name] Primary Care Josephanthony Tindel: [name] Other Clinician: Referring [name]: Treating [name]/Extender: Christella Hartigan in Treatment: 16 Active Inactive Venous Leg Ulcer Nursing Diagnoses: Knowledge deficit related to disease process and management Potential for venous Insuffiency (use before diagnosis confirmed) Goals: Patient will maintain optimal edema control Date Initiated: 02/09/2020 Target Resolution Date: 06/07/2020 Goal Status: Active Patient/caregiver will verbalize understanding of disease process and disease management Date Initiated: 02/09/2020 Date Inactivated: 03/02/2020 Target Resolution Date: 03/08/2020 Goal Status: Met Interventions: Assess peripheral edema status every visit. Compression as ordered Provide education on venous insufficiency Treatment Activities: Therapeutic compression applied : 02/09/2020 Notes: Wound/Skin Impairment Nursing Diagnoses: Impaired tissue integrity Knowledge deficit related to ulceration/compromised skin integrity Goals: Patient/caregiver will verbalize understanding of skin care regimen Date Initiated: 02/09/2020 Target Resolution Date: 06/07/2020 Goal Status:  Active Ulcer/skin breakdown will have a volume reduction of 30% by week 4 Date Initiated: 02/09/2020 Date Inactivated: 03/02/2020 Target Resolution Date: 03/08/2020 Goal Status: Met Interventions: Assess patient/caregiver ability to obtain necessary supplies Assess patient/caregiver ability to perform ulcer/skin care regimen upon admission and as needed Assess ulceration(s) every visit Provide education on ulcer and skin care Treatment Activities: Skin care regimen initiated : 02/09/2020 Topical wound management initiated : 02/09/2020 Notes: Electronic Signature(s) Signed: 05/31/2020 5:57:36 PM By: [name] RN, BSN Entered By: [name] on 05/31/2020 10:52:00 -------------------------------------------------------------------------------- Pain Assessment Details Patient  Name: Date of Service: [name], [name] 05/31/2020 10:15 A M Medical Record Number: [id number] Patient Account Number: [id number] Date of Birth/Sex: Treating RN: 10-07-1950 (69 y.o. [name] Primary Care Avnoor Koury: [name] Other Clinician: Referring [name]: Treating [name]/Extender: Christella Hartigan in Treatment: 16 Active Problems Location of Pain Severity and Description of Pain Patient Has Paino No Site Locations Rate the pain. Current Pain Level: 0 Pain Management and Medication Current Pain Management: Medication: No Cold Application: No Rest: No Massage: No Activity: No T.E.N.S.: No Heat Application: No Leg drop or elevation: No Is the Current Pain Management Adequate: Adequate How does your wound impact your activities of daily livingo Sleep: No Bathing: No Appetite: No Relationship With Others: No Bladder Continence: No Emotions: No Bowel Continence: No Work: No Toileting: No Drive: No Dressing: No Hobbies: No Electronic Signature(s) Signed: 05/31/2020 5:59:08 PM By: [name] Entered By: [name] on 05/31/2020  10:17:58 -------------------------------------------------------------------------------- Patient/Caregiver Education Details Patient Name: Date of Service: [name] [name], [place] [name] 11/10/2021andnbsp10:15 A M Medical Record Number: [id number] Patient Account Number: [id number] Date of Birth/Gender: Treating RN: 13-Nov-1950 (69 y.o. [name] Primary Care Physician: [name] Other Clinician: Referring Physician: Treating Physician/Extender: Christella Hartigan in Treatment: 16 Education Assessment Education Provided To: Patient Education Topics Provided Venous: Methods: Explain/Verbal Responses: Reinforcements needed, State content correctly Wound/Skin Impairment: Methods: Explain/Verbal Responses: Reinforcements needed, State content correctly Electronic Signature(s) Signed: 05/31/2020 5:57:36 PM By: [name] RN, BSN Entered By: [name] on 05/31/2020 10:52:22 -------------------------------------------------------------------------------- Wound Assessment Details Patient Name: Date of Service: JO [name], [place] [name] 05/31/2020 10:15 A M Medical Record Number: [id number] Patient Account Number: [id number] Date of Birth/Sex: Treating RN: 19-Jul-1951 (69 y.o. [name], [place] Primary Care Verneal Wiers: [name] Other Clinician: Referring Ankit Degregorio: Treating Daneen Volcy/Extender: Christella Hartigan in Treatment: 16 Wound Status Wound Number: 8 Primary Diabetic Wound/Ulcer of the Lower Extremity Etiology: Wound Location: Left T Third oe Wound Open Wounding Event: Blister Status: Date Acquired: 02/23/2020 Comorbid Cataracts, Anemia, Asthma, Hypertension, Type II Diabetes, End Weeks Of Treatment: 14 History: Stage Renal Disease, Osteomyelitis, Neuropathy Clustered Wound: No Wound Measurements Length: (cm) 0.4 Width: (cm) 0.4 Depth: (cm) 0.2 Area: (cm) 0.126 Volume: (cm) 0.025 % Reduction in Area: 10.6% %  Reduction in Volume: -78.6% Epithelialization: Small (1-33%) Tunneling: No Undermining: No Wound Description Classification: Grade 2 Wound Margin: Flat and Intact Exudate Amount: Medium Exudate Type: Serosanguineous Exudate Color: red, brown Foul Odor After Cleansing: No Slough/Fibrino Yes Wound Bed Granulation Amount: Small (1-33%) Exposed Structure Granulation Quality: Red Fascia Exposed: No Necrotic Amount: Large (67-100%) Fat Layer (Subcutaneous Tissue) Exposed: Yes Necrotic Quality: Adherent Slough Tendon Exposed: No Muscle Exposed: No Joint Exposed: No Bone Exposed: No Electronic Signature(s) Signed: 05/31/2020 5:59:08 PM By: [name] Entered By: [name] on 05/31/2020 10:18:36 -------------------------------------------------------------------------------- Vitals Details Patient Name: Date of Service: JO [name], [place] [name] 05/31/2020 10:15 A M Medical Record Number: [id number] Patient Account Number: [id number] Date of Birth/Sex: Treating RN: 11-Jul-1951 (69 y.o. [name], [name] Primary Care Sheretha Shadd: [name] Other Clinician: Referring Erynne Kealey: Treating Albie Arizpe/Extender: Christella Hartigan in Treatment: 16 Vital Signs Time Taken: 10:12 Temperature (F): 98.1 Height (in): 67 Pulse (bpm): 57 Weight (lbs): 202 Respiratory Rate (breaths/min): 16 Body Mass Index (BMI): 31.6 Blood Pressure (mmHg): 123/75 Reference Range: 80 - 120 mg / dl Notes blood glucose machine currently out of service per patient. Working on fixing it.  Electronic Signature(s) Signed: 05/31/2020 5:59:08 PM By: [name] Entered By: [name] on 05/31/2020 10:17:49

## 2020-06-03 DIAGNOSIS — Z992 Dependence on renal dialysis: Secondary | ICD-10-CM | POA: Diagnosis not present

## 2020-06-03 DIAGNOSIS — R300 Dysuria: Secondary | ICD-10-CM | POA: Diagnosis not present

## 2020-06-03 DIAGNOSIS — N186 End stage renal disease: Secondary | ICD-10-CM | POA: Diagnosis not present

## 2020-06-03 DIAGNOSIS — D689 Coagulation defect, unspecified: Secondary | ICD-10-CM | POA: Diagnosis not present

## 2020-06-03 DIAGNOSIS — N390 Urinary tract infection, site not specified: Secondary | ICD-10-CM | POA: Diagnosis not present

## 2020-06-03 DIAGNOSIS — D631 Anemia in chronic kidney disease: Secondary | ICD-10-CM | POA: Diagnosis not present

## 2020-06-03 DIAGNOSIS — N2581 Secondary hyperparathyroidism of renal origin: Secondary | ICD-10-CM | POA: Diagnosis not present

## 2020-06-06 DIAGNOSIS — N186 End stage renal disease: Secondary | ICD-10-CM | POA: Diagnosis not present

## 2020-06-06 DIAGNOSIS — R300 Dysuria: Secondary | ICD-10-CM | POA: Diagnosis not present

## 2020-06-06 DIAGNOSIS — D631 Anemia in chronic kidney disease: Secondary | ICD-10-CM | POA: Diagnosis not present

## 2020-06-06 DIAGNOSIS — Z992 Dependence on renal dialysis: Secondary | ICD-10-CM | POA: Diagnosis not present

## 2020-06-06 DIAGNOSIS — N2581 Secondary hyperparathyroidism of renal origin: Secondary | ICD-10-CM | POA: Diagnosis not present

## 2020-06-06 DIAGNOSIS — N390 Urinary tract infection, site not specified: Secondary | ICD-10-CM | POA: Diagnosis not present

## 2020-06-06 DIAGNOSIS — D689 Coagulation defect, unspecified: Secondary | ICD-10-CM | POA: Diagnosis not present

## 2020-06-07 ENCOUNTER — Other Ambulatory Visit: Payer: Self-pay

## 2020-06-07 ENCOUNTER — Encounter (HOSPITAL_BASED_OUTPATIENT_CLINIC_OR_DEPARTMENT_OTHER): Payer: Medicare Other | Admitting: Physician Assistant

## 2020-06-07 DIAGNOSIS — E11621 Type 2 diabetes mellitus with foot ulcer: Secondary | ICD-10-CM | POA: Diagnosis not present

## 2020-06-07 DIAGNOSIS — L97812 Non-pressure chronic ulcer of other part of right lower leg with fat layer exposed: Secondary | ICD-10-CM | POA: Diagnosis not present

## 2020-06-07 DIAGNOSIS — L97819 Non-pressure chronic ulcer of other part of right lower leg with unspecified severity: Secondary | ICD-10-CM | POA: Diagnosis not present

## 2020-06-07 DIAGNOSIS — E1140 Type 2 diabetes mellitus with diabetic neuropathy, unspecified: Secondary | ICD-10-CM | POA: Diagnosis not present

## 2020-06-07 DIAGNOSIS — E11622 Type 2 diabetes mellitus with other skin ulcer: Secondary | ICD-10-CM | POA: Diagnosis not present

## 2020-06-07 DIAGNOSIS — I872 Venous insufficiency (chronic) (peripheral): Secondary | ICD-10-CM | POA: Diagnosis not present

## 2020-06-07 DIAGNOSIS — I120 Hypertensive chronic kidney disease with stage 5 chronic kidney disease or end stage renal disease: Secondary | ICD-10-CM | POA: Diagnosis not present

## 2020-06-07 DIAGNOSIS — L97319 Non-pressure chronic ulcer of right ankle with unspecified severity: Secondary | ICD-10-CM | POA: Diagnosis not present

## 2020-06-07 DIAGNOSIS — Z992 Dependence on renal dialysis: Secondary | ICD-10-CM | POA: Diagnosis not present

## 2020-06-07 DIAGNOSIS — N186 End stage renal disease: Secondary | ICD-10-CM | POA: Diagnosis not present

## 2020-06-07 DIAGNOSIS — E1122 Type 2 diabetes mellitus with diabetic chronic kidney disease: Secondary | ICD-10-CM | POA: Diagnosis not present

## 2020-06-07 DIAGNOSIS — L97522 Non-pressure chronic ulcer of other part of left foot with fat layer exposed: Secondary | ICD-10-CM | POA: Diagnosis not present

## 2020-06-07 DIAGNOSIS — L97512 Non-pressure chronic ulcer of other part of right foot with fat layer exposed: Secondary | ICD-10-CM | POA: Diagnosis not present

## 2020-06-07 DIAGNOSIS — L97822 Non-pressure chronic ulcer of other part of left lower leg with fat layer exposed: Secondary | ICD-10-CM | POA: Diagnosis not present

## 2020-06-07 DIAGNOSIS — L97312 Non-pressure chronic ulcer of right ankle with fat layer exposed: Secondary | ICD-10-CM | POA: Diagnosis not present

## 2020-06-07 NOTE — Progress Notes (Addendum)
BASHA, KRYGIER ([id number]) Visit Report for 06/07/2020 Chief Complaint Document Details Patient Name: Date of Service: [name] [name], [name] 06/07/2020 9:00 A M Medical Record Number: [id number] Patient Account Number: [id number] Date of Birth/Sex: Treating RN: June 21, 1951 (69 y.o. [name] Primary Care Provider: [name] Other Clinician: Referring Provider: Treating Provider/Extender: Christella Hartigan in Treatment: 17 Information Obtained from: Patient Chief Complaint Multiple LE Ulcers Electronic Signature(s) Signed: 06/07/2020 9:42:12 AM By: [name] PA-C Entered By: [name] on 06/07/2020 09:42:11 -------------------------------------------------------------------------------- HPI Details Patient Name: Date of Service: [facility] [name], [place] [name] 06/07/2020 9:00 A M Medical Record Number: [id number] Patient Account Number: [id number] Date of Birth/Sex: Treating RN: 1950-08-05 (69 y.o. [name] Primary Care Provider: [name] Other Clinician: Referring Provider: Treating Provider/Extender: Christella Hartigan in Treatment: 22 History of Present Illness HPI Description: Evaluate7/21/2021 today patient presents for evaluation of ulcerations on her legs which she tells me tend to come and go as far as small blistering locations and sometimes are better than other times. Right now she tells me that this is actually doing a little bit better but nonetheless will not completely go away. She has ulcerations bilaterally on her lower extremities. The right is worse than the left. 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 The patient has no evidence of active infection at this time. She however appears to have proficient blood flow into the extremities and I think would tolerate a 3 layer compression wrap  she was noncompressible on arterial studies. Nonetheless there was no obvious signs of occlusion. 02/16/2020 on evaluation today patient appears to be doing much better in regard to her wounds in general. On the past week she has made significant improvements which is great news there is no signs of active infection at this time. No fevers, chills, nausea, vomiting, or diarrhea. 02/23/2020 on evaluation today patient appears to be doing well in regard to her lower extremity ulcers and ankle ulcers. Overall I feel like she is making great progress and in general I am extremely pleased with how things stand. There is no sign of active infection at this time which is also good news. She will require slight debridement around the right ankle region. 03/02/20-Patient returns at 1 week, has few new areas open on the left leg, especially 1 healed area on the medial ankle that has slight small open area now, to anterior leg areas that started out as blisters that are open now. She was using juxta lite compression to the left, she used Band-Aid to cover the vesicle that had ruptured on the left anterior leg, we are using 3 layer compression on the right. We are using silver alginate to the toe wound on the left. 03/08/2020 upon evaluation today patient unfortunately is doing much worse in regard to her right leg although her left leg looks like is doing better. Again we have taken her compression wraps off last week since she was doing well and transitioned her to her Velcro compression wrap. However apparently the patient is stating this was not put on here in the clinic and she never put it on at home. With that being said unfortunately she developed swelling and opened up new wounds which are present today she has quite a few of them in fact. 03/15/2020 on evaluation today patient appears to be doing  well with regard to her wounds on the lower extremities bilaterally. Fortunately there is no signs of active  infection at this time. No fever chills noted. Overall I am actually very pleased with where things stand currently. 03/22/2020 on evaluation today patient appears to be doing fairly well in regard to her wounds in general. She just has really one area left on her toe everything else is healed on her legs. She does have her Velcro compression wraps which I think are appropriate at this point. Fortunately there is no signs of active infection at this time. No fevers, chills, nausea, vomiting, or diarrhea. 03/29/2020 on evaluation today patient appears to be doing poorly in regard to her right leg which is pretty much completely reopened compared to last week. She states it was okay until Saturday when she woke up she had blisters. With that being said it appears that this is likely stemming from the fact that the patient is apparently though I just found this out today going to sleep in her bed with her legs up on the bed with her but throughout the night she tends to end up with her legs hanging off the side or bottom of the bed on the floor. With that being said I think that this likely is happening much too long and then she has significant swelling the builds up as she sleeps. Obviously I think this is the reasoning behind what we are seeing currently. 04/05/2020 on evaluation today patient appears to be doing quite well with regard to her leg ulcers at this point. Fortunately there is no signs of anything significantly open again which is great news nonetheless I am to continue wrapping her for at least a couple of weeks due to the fact that she seems to continually reopen as soon as we unwrapped her. 04/12/2020 upon evaluation today patient appears to be doing excellent in regard to her leg ulcers all appear to be improving which is great news. She is also doing better in regard to the toe the infection seems to be under better control and the Santyl is helping to clean this up. Overall very pleased much  more so than what I saw last week. 04/19/2020 on evaluation today patient appears to be doing well with regard to her leg ulcers which are showing no signs of opening at this point. Her toe is also showing signs of improvement she is about done with her antibiotic. Overall I feel like things are doing quite well. 04/26/2020 upon evaluation today patient appears to be doing unfortunately a little bit worse compared to previous. She has reopened wounds on the right anterior lower extremity. This is unfortunate as we were hoping that this would really maintain healing. That does not appear to be the case. I think with him the need to see about making a referral for venous studies and provider evaluation with [facility] imaging with the interventional radiologist. 08/11/2019 upon evaluation today patient appears to be doing well in general in regard to her legs. Her right leg she does have a new wound that opened since last week. On the left leg everything appears to be healed she does still have the opening on her toe which we are managing but again this does not appear to be any worse. She just needs to get this to feeling and cover over. There is no evidence of infection which is at least great news. 05/17/2020 upon evaluation today patient appears to be doing decently well in  regard to her leg ulcers this is good news. She does have still an opening on the left second toe. She has been let this dry out that not really what we want to see currently. For that reason I discussed with her again the fact that she needs to keep this covered and not allow it to dry out in order to allow for appropriate healing. She voiced understanding and states she did not know that. 05/24/2020 upon evaluation today patient's legs both appear to be still be completely healed which is great news. We are now going to transition into having her utilize her juxta lite compression wraps on the bilateral lower extremities. She is  definitely ready for this. Her toe also seems to be doing better. 05/31/2020 on evaluation today patient appears to be doing well with regard to her legs. There is nothing open at this point. Her toe still has some slough noted and get a clean that off today by way of sharp debridement other than that I feel like the collagen is probably still her best bet in that regard. 06/07/2020 upon evaluation today patient appears to be doing well with regard to her left leg and her toe ulcer the toe seems to be doing better the leg is doing okay. The right leg unfortunately she has new wounds I think her shoes are rubbing on the side of her ankle which is what is causing part of this issue. Fortunately there is no signs of active infection at this time which is great news. Electronic Signature(s) Signed: 06/07/2020 10:03:15 AM By: [name] PA-C Entered By: [name] on 06/07/2020 10:03:13 -------------------------------------------------------------------------------- Physical Exam Details Patient Name: Date of Service: [facility] [name], [place] [name] 06/07/2020 9:00 A M Medical Record Number: [id number] Patient Account Number: [id number] Date of Birth/Sex: Treating RN: 1950-07-27 (69 y.o. [name] Primary Care Provider: [name] Other Clinician: Referring Provider: Treating Provider/Extender: Christella Hartigan in Treatment: 33 Constitutional Well-nourished and well-hydrated in no acute distress. Respiratory normal breathing without difficulty. Psychiatric this patient is able to make decisions and demonstrates good insight into disease process. Alert and Oriented x 3. pleasant and cooperative. Notes Upon inspection patient's wound bed actually showed signs of good granulation at this time. There does not appear to be evidence of active infection which is great news and overall very pleased with where things stand. No sharp debridement was necessary though I did  mechanically agree with saline and gauze at all wound locations. Electronic Signature(s) Signed: 06/07/2020 10:04:11 AM By: [name] PA-C Entered By: [name] on 06/07/2020 10:04:10 -------------------------------------------------------------------------------- Physician Orders Details Patient Name: Date of Service: [facility] [name], [place] [name] 06/07/2020 9:00 A M Medical Record Number: [id number] Patient Account Number: [id number] Date of Birth/Sex: Treating RN: June 24, 1951 (69 y.o. [name] Primary Care Provider: [name] Other Clinician: Referring Provider: Treating Provider/Extender: Christella Hartigan in Treatment: [phone] Verbal / Phone Orders: No Diagnosis Coding ICD-10 Coding Code Description I87.2 Venous insufficiency (chronic) (peripheral) E11.621 Type 2 diabetes mellitus with foot ulcer L97.522 Non-pressure chronic ulcer of other part of left foot with fat layer exposed N18.6 End stage renal disease Z99.2 Dependence on renal dialysis I10 Essential (primary) hypertension Follow-up Appointments Return Appointment in 1 week. Dressing Change Frequency Wound #8 Left T Third oe Change Dressing every other day. Wound #17 Right,Medial Malleolus Do not change entire dressing for one week. Wound #18 Right,Medial Lower Leg Do not  change entire dressing for one week. Skin Barriers/Peri-Wound Care Moisturizing lotion - to both legs daily Wound Cleansing Wound #8 Left T Third oe May shower and wash wound with soap and water. Primary Wound Dressing Wound #17 Right,Medial Malleolus Silver Collagen - moisten with saline Wound #18 Right,Medial Lower Leg Silver Collagen - moisten with saline Wound #8 Left T Third oe Silver Collagen - moisten with saline Secondary Dressing Wound #8 Left T Third oe Kerlix/Rolled Gauze Dry Gauze Wound #17 Right,Medial Malleolus Dry Gauze Wound #18 Right,Medial Lower Leg Dry Gauze Edema Control 3  Layer Compression System - Right Lower Extremity Avoid standing for long periods of time Elevate legs to the level of the heart or above for 30 minutes daily and/or when sitting, a frequency of: - throughout the day Exercise regularly Support Garment 20-30 mm/Hg pressure to: - juxtalite compression garments left leg daily Electronic Signature(s) Signed: 06/07/2020 5:04:51 PM By: [name] PA-C Signed: 06/08/2020 2:58:18 PM By: [name] RN, BSN Entered By: [name] on 06/07/2020 10:02:06 -------------------------------------------------------------------------------- Problem List Details Patient Name: Date of Service: [facility] [name], [place] [name] 06/07/2020 9:00 A M Medical Record Number: [id number] Patient Account Number: [id number] Date of Birth/Sex: Treating RN: 09/06/1950 (69 y.o. [name] Primary Care Provider: [name] Other Clinician: Referring Provider: Treating Provider/Extender: Christella Hartigan in Treatment: 17 Active Problems ICD-10 Encounter Code Description Active Date MDM Diagnosis I87.2 Venous insufficiency (chronic) (peripheral) 02/09/2020 No Yes E11.621 Type 2 diabetes mellitus with foot ulcer 02/09/2020 No Yes L97.522 Non-pressure chronic ulcer of other part of left foot with fat layer exposed 05/31/2020 No Yes N18.6 End stage renal disease 02/09/2020 No Yes Z99.2 Dependence on renal dialysis 02/09/2020 No Yes I10 Essential (primary) hypertension 02/09/2020 No Yes Inactive Problems Resolved Problems ICD-10 Code Description Active Date Resolved Date L97.822 Non-pressure chronic ulcer of other part of left lower leg with fat layer exposed 02/09/2020 02/09/2020 L97.312 Non-pressure chronic ulcer of right ankle with fat layer exposed 02/09/2020 02/09/2020 L97.812 Non-pressure chronic ulcer of other part of right lower leg with fat layer exposed 02/09/2020 02/09/2020 L97.512 Non-pressure chronic ulcer of other part of right foot  with fat layer exposed 02/09/2020 02/09/2020 Electronic Signature(s) Signed: 06/07/2020 9:42:05 AM By: [name] PA-C Entered By: [name] on 06/07/2020 09:42:05 -------------------------------------------------------------------------------- Progress Note Details Patient Name: Date of Service: [facility] [name], [place] [name] 06/07/2020 9:00 A M Medical Record Number: [id number] Patient Account Number: [id number] Date of Birth/Sex: Treating RN: 1951-01-15 (69 y.o. [name] Primary Care Provider: [name] Other Clinician: Referring Provider: Treating Provider/Extender: Christella Hartigan in Treatment: 80 Subjective Chief Complaint Information obtained from Patient Multiple LE Ulcers History of Present Illness (HPI) Evaluate7/21/2021 today patient presents for evaluation of ulcerations on her legs which she tells me tend to come and go as far as small blistering locations and sometimes are better than other times. Right now she tells me that this is actually doing a little bit better but nonetheless will not completely go away. She has ulcerations bilaterally on her lower extremities. The right is worse than the left. 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 The patient has no evidence of active infection at this time. She however appears to have proficient blood flow into the extremities and I think would tolerate a 3 layer compression wrap she was noncompressible on arterial studies.  Nonetheless there was no obvious signs of occlusion. 02/16/2020 on evaluation today patient appears to be doing much better in regard to her wounds in general. On the past week she has made significant improvements which is great news there is no signs of active infection at this time. No fevers, chills, nausea, vomiting, or diarrhea. 02/23/2020 on evaluation today  patient appears to be doing well in regard to her lower extremity ulcers and ankle ulcers. Overall I feel like she is making great progress and in general I am extremely pleased with how things stand. There is no sign of active infection at this time which is also good news. She will require slight debridement around the right ankle region. 03/02/20-Patient returns at 1 week, has few new areas open on the left leg, especially 1 healed area on the medial ankle that has slight small open area now, to anterior leg areas that started out as blisters that are open now. She was using juxta lite compression to the left, she used Band-Aid to cover the vesicle that had ruptured on the left anterior leg, we are using 3 layer compression on the right. We are using silver alginate to the toe wound on the left. 03/08/2020 upon evaluation today patient unfortunately is doing much worse in regard to her right leg although her left leg looks like is doing better. Again we have taken her compression wraps off last week since she was doing well and transitioned her to her Velcro compression wrap. However apparently the patient is stating this was not put on here in the clinic and she never put it on at home. With that being said unfortunately she developed swelling and opened up new wounds which are present today she has quite a few of them in fact. 03/15/2020 on evaluation today patient appears to be doing well with regard to her wounds on the lower extremities bilaterally. Fortunately there is no signs of active infection at this time. No fever chills noted. Overall I am actually very pleased with where things stand currently. 03/22/2020 on evaluation today patient appears to be doing fairly well in regard to her wounds in general. She just has really one area left on her toe everything else is healed on her legs. She does have her Velcro compression wraps which I think are appropriate at this point. Fortunately there is no  signs of active infection at this time. No fevers, chills, nausea, vomiting, or diarrhea. 03/29/2020 on evaluation today patient appears to be doing poorly in regard to her right leg which is pretty much completely reopened compared to last week. She states it was okay until Saturday when she woke up she had blisters. With that being said it appears that this is likely stemming from the fact that the patient is apparently though I just found this out today going to sleep in her bed with her legs up on the bed with her but throughout the night she tends to end up with her legs hanging off the side or bottom of the bed on the floor. With that being said I think that this likely is happening much too long and then she has significant swelling the builds up as she sleeps. Obviously I think this is the reasoning behind what we are seeing currently. 04/05/2020 on evaluation today patient appears to be doing quite well with regard to her leg ulcers at this point. Fortunately there is no signs of anything significantly open again which is great news nonetheless  I am to continue wrapping her for at least a couple of weeks due to the fact that she seems to continually reopen as soon as we unwrapped her. 04/12/2020 upon evaluation today patient appears to be doing excellent in regard to her leg ulcers all appear to be improving which is great news. She is also doing better in regard to the toe the infection seems to be under better control and the Santyl is helping to clean this up. Overall very pleased much more so than what I saw last week. 04/19/2020 on evaluation today patient appears to be doing well with regard to her leg ulcers which are showing no signs of opening at this point. Her toe is also showing signs of improvement she is about done with her antibiotic. Overall I feel like things are doing quite well. 04/26/2020 upon evaluation today patient appears to be doing unfortunately a little bit worse compared  to previous. She has reopened wounds on the right anterior lower extremity. This is unfortunate as we were hoping that this would really maintain healing. That does not appear to be the case. I think with him the need to see about making a referral for venous studies and provider evaluation with [facility] imaging with the interventional radiologist. 08/11/2019 upon evaluation today patient appears to be doing well in general in regard to her legs. Her right leg she does have a new wound that opened since last week. On the left leg everything appears to be healed she does still have the opening on her toe which we are managing but again this does not appear to be any worse. She just needs to get this to feeling and cover over. There is no evidence of infection which is at least great news. 05/17/2020 upon evaluation today patient appears to be doing decently well in regard to her leg ulcers this is good news. She does have still an opening on the left second toe. She has been let this dry out that not really what we want to see currently. For that reason I discussed with her again the fact that she needs to keep this covered and not allow it to dry out in order to allow for appropriate healing. She voiced understanding and states she did not know that. 05/24/2020 upon evaluation today patient's legs both appear to be still be completely healed which is great news. We are now going to transition into having her utilize her juxta lite compression wraps on the bilateral lower extremities. She is definitely ready for this. Her toe also seems to be doing better. 05/31/2020 on evaluation today patient appears to be doing well with regard to her legs. There is nothing open at this point. Her toe still has some slough noted and get a clean that off today by way of sharp debridement other than that I feel like the collagen is probably still her best bet in that regard. 06/07/2020 upon evaluation today patient  appears to be doing well with regard to her left leg and her toe ulcer the toe seems to be doing better the leg is doing okay. The right leg unfortunately she has new wounds I think her shoes are rubbing on the side of her ankle which is what is causing part of this issue. Fortunately there is no signs of active infection at this time which is great news. Objective Constitutional Well-nourished and well-hydrated in no acute distress. Vitals Time Taken: 9:20 AM, Height: 67 in, Weight: 202 lbs, BMI:  31.6, Temperature: 98.1 F, Pulse: 90 bpm, Respiratory Rate: 16 breaths/min, Blood Pressure: 119/74 mmHg. Respiratory normal breathing without difficulty. Psychiatric this patient is able to make decisions and demonstrates good insight into disease process. Alert and Oriented x 3. pleasant and cooperative. General Notes: Upon inspection patient's wound bed actually showed signs of good granulation at this time. There does not appear to be evidence of active infection which is great news and overall very pleased with where things stand. No sharp debridement was necessary though I did mechanically agree with saline and gauze at all wound locations. Integumentary (Hair, Skin) Wound #17 status is Open. Original cause of wound was Gradually Appeared. The wound is located on the Right,Medial Malleolus. The wound measures 1.1cm length x 0.6cm width x 0.1cm depth; 0.518cm^2 area and 0.052cm^3 volume. Wound #18 status is Open. Original cause of wound was Gradually Appeared. The wound is located on the Right,Medial Lower Leg. The wound measures 0.6cm length x 0.2cm width x 0.1cm depth; 0.094cm^2 area and 0.009cm^3 volume. Wound #8 status is Open. Original cause of wound was Blister. The wound is located on the Left T Third. The wound measures 0.4cm length x 0.3cm width x oe 0.1cm depth; 0.094cm^2 area and 0.009cm^3 volume. Assessment Active Problems ICD-10 Venous insufficiency (chronic) (peripheral) Type  2 diabetes mellitus with foot ulcer Non-pressure chronic ulcer of other part of left foot with fat layer exposed End stage renal disease Dependence on renal dialysis Essential (primary) hypertension Procedures Wound #17 Pre-procedure diagnosis of Wound #17 is a Diabetic Wound/Ulcer of the Lower Extremity located on the Right,Medial Malleolus . There was a Three Layer Compression Therapy Procedure by [name], RN. Post procedure Diagnosis Wound #17: Same as Pre-Procedure Wound #18 Pre-procedure diagnosis of Wound #18 is a Diabetic Wound/Ulcer of the Lower Extremity located on the Right,Medial Lower Leg . There was a Three Layer Compression Therapy Procedure by [name], RN. Post procedure Diagnosis Wound #18: Same as Pre-Procedure Plan Follow-up Appointments: Return Appointment in 1 week. Dressing Change Frequency: Wound #8 Left T Third: oe Change Dressing every other day. Wound #17 Right,Medial Malleolus: Do not change entire dressing for one week. Wound #18 Right,Medial Lower Leg: Do not change entire dressing for one week. Skin Barriers/Peri-Wound Care: Moisturizing lotion - to both legs daily Wound Cleansing: Wound #8 Left T Third: oe May shower and wash wound with soap and water. Primary Wound Dressing: Wound #17 Right,Medial Malleolus: Silver Collagen - moisten with saline Wound #18 Right,Medial Lower Leg: Silver Collagen - moisten with saline Wound #8 Left T Third: oe Silver Collagen - moisten with saline Secondary Dressing: Wound #8 Left T Third: oe Kerlix/Rolled Gauze Dry Gauze Wound #17 Right,Medial Malleolus: Dry Gauze Wound #18 Right,Medial Lower Leg: Dry Gauze Edema Control: 3 Layer Compression System - Right Lower Extremity Avoid standing for long periods of time Elevate legs to the level of the heart or above for 30 minutes daily and/or when sitting, a frequency of: - throughout the day Exercise regularly Support Garment 20-30 mm/Hg pressure  to: - juxtalite compression garments left leg daily 1. I would recommend at this time that we going to continue with collagen to the toe ulcer and we will also use collagen on the new wounds on the right leg as well that reopened. 2. I am also can recommend at this time that we have the patient continue with a 3 layer compression wrap on the right lower extremity that is done well in the past as far as  getting the wounds protected and healed rather rapidly. 3. I am also can recommend that the patient needs to get new shoes I think that is part of the reason why this has reopened on the right leg. 3. I am also can recommend she needs to be elevating her legs much as possible try to keep edema under good control. We will see patient back for reevaluation in 1 week here in the clinic. If anything worsens or changes patient will contact our office for additional recommendations. Electronic Signature(s) Signed: 06/07/2020 10:05:51 AM By: [name] PA-C Entered By: [name] on 06/07/2020 10:05:51 -------------------------------------------------------------------------------- SuperBill Details Patient Name: Date of Service: [facility] [name], [place] [name] 06/07/2020 Medical Record Number: [id number] Patient Account Number: [id number] Date of Birth/Sex: Treating RN: 1950/12/04 (69 y.o. [name] Primary Care Provider: [name] Other Clinician: Referring Provider: Treating Provider/Extender: Christella Hartigan in Treatment: 17 Diagnosis Coding ICD-10 Codes Code Description I87.2 Venous insufficiency (chronic) (peripheral) E11.621 Type 2 diabetes mellitus with foot ulcer L97.522 Non-pressure chronic ulcer of other part of left foot with fat layer exposed N18.6 End stage renal disease Z99.2 Dependence on renal dialysis I10 Essential (primary) hypertension Facility Procedures CPT4 Code: [id number] Description: (Facility Use Only) [phone] - [facility]  LWR RT LEG Modifier: Quantity: 1 Physician Procedures : CPT4 Code Description Modifier [id number] - WC PHYS LEVEL 3 - EST PT ICD-10 Diagnosis Description E11.621 Type 2 diabetes mellitus with foot ulcer 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

## 2020-06-08 DIAGNOSIS — D689 Coagulation defect, unspecified: Secondary | ICD-10-CM | POA: Diagnosis not present

## 2020-06-08 DIAGNOSIS — N186 End stage renal disease: Secondary | ICD-10-CM | POA: Diagnosis not present

## 2020-06-08 DIAGNOSIS — R300 Dysuria: Secondary | ICD-10-CM | POA: Diagnosis not present

## 2020-06-08 DIAGNOSIS — N2581 Secondary hyperparathyroidism of renal origin: Secondary | ICD-10-CM | POA: Diagnosis not present

## 2020-06-08 DIAGNOSIS — D631 Anemia in chronic kidney disease: Secondary | ICD-10-CM | POA: Diagnosis not present

## 2020-06-08 DIAGNOSIS — Z992 Dependence on renal dialysis: Secondary | ICD-10-CM | POA: Diagnosis not present

## 2020-06-08 DIAGNOSIS — N390 Urinary tract infection, site not specified: Secondary | ICD-10-CM | POA: Diagnosis not present

## 2020-06-08 NOTE — Progress Notes (Signed)
KINYA, MEINE ([id number]) Visit Report for 06/07/2020 Arrival Information Details Patient Name: Date of Service: [name], [name] 06/07/2020 9:00 A M Medical Record Number: [id number] Patient Account Number: [id number] Date of Birth/Sex: Treating RN: 1950/10/27 (69 y.o. [name] Primary Care Ermin Parisien: [name] Other Clinician: Referring Dlisa Barnwell: Treating Anahis Furgeson/Extender: Christella Hartigan in Treatment: 10 Visit Information History Since Last Visit Added or deleted any medications: No Patient Arrived: Wheel Chair Any new allergies or adverse reactions: No Arrival Time: 09:19 Had a fall or experienced change in No Accompanied By: self activities of daily living that may affect Transfer Assistance: None risk of falls: Patient Identification Verified: Yes Signs or symptoms of abuse/neglect since last visito No Secondary Verification Process Completed: Yes Hospitalized since last visit: No Patient Requires Transmission-Based Precautions: No Implantable device outside of the clinic excluding No Patient Has Alerts: Yes cellular tissue based products placed in the center Patient Alerts: Right ABI: [place] since last visit: Left ABI: [place] Has Dressing in Place as Prescribed: Yes Pain Present Now: No Electronic Signature(s) Signed: 06/07/2020 11:19:23 AM By: Sandre Kitty Entered By: Sandre Kitty on 06/07/2020 09:20:53 -------------------------------------------------------------------------------- Compression Therapy Details Patient Name: Date of Service: [name], [place] [name] 06/07/2020 9:00 A M Medical Record Number: [id number] Patient Account Number: [id number] Date of Birth/Sex: Treating RN: Nov 24, 1950 (69 y.o. [name] Primary Care Keylen Eckenrode: [name] Other Clinician: Referring Anayeli Arel: Treating Sharlet Notaro/Extender: Christella Hartigan in Treatment: 17 Compression Therapy Performed for Wound Assessment:  Wound #17 Right,Medial Malleolus Performed By: [name], RN Compression Type: Three Layer Post Procedure Diagnosis Same as Pre-procedure Electronic Signature(s) Signed: 06/08/2020 2:58:18 PM By: [name] RN, BSN Entered By: [name] on 06/07/2020 09:59:31 -------------------------------------------------------------------------------- Compression Therapy Details Patient Name: Date of Service: [name], [place] [name] 06/07/2020 9:00 A M Medical Record Number: [id number] Patient Account Number: [id number] Date of Birth/Sex: Treating RN: [birth year] (69 y.o. [name] Primary Care Maya Scholer: [name] Other Clinician: Referring Tron Flythe: Treating Cassanda Walmer/Extender: Christella Hartigan in Treatment: 17 Compression Therapy Performed for Wound Assessment: Wound #18 Right,Medial Lower Leg Performed By: Clinician [name], RN Compression Type: Three Layer Post Procedure Diagnosis Same as Pre-procedure Electronic Signature(s) Signed: 06/08/2020 2:58:18 PM By: [name] RN, BSN Entered By: [name] on 06/07/2020 09:59:31 -------------------------------------------------------------------------------- Encounter Discharge Information Details Patient Name: Date of Service: [facility] [name], [place] [name] 06/07/2020 9:00 A M Medical Record Number: [id number] Patient Account Number: [id number] Date of Birth/Sex: Treating RN: 1950-12-11 (69 y.o. [name] Primary Care Cloma Rahrig: [name] Other Clinician: Referring Shelisa Fern: Treating Harolyn Cocker/Extender: Christella Hartigan in Treatment: 17 Encounter Discharge Information Items Discharge Condition: Stable Ambulatory Status: Wheelchair Discharge Destination: Home Transportation: Private Auto Accompanied By: self Schedule Follow-up Appointment: Yes Clinical Summary of Care: Patient Declined Electronic Signature(s) Signed: 06/07/2020 5:02:10 PM By:  [name] RN Entered By: [name] on 06/07/2020 10:21:51 -------------------------------------------------------------------------------- Leona Details Patient Name: Date of Service: [facility] [name], [place] [name] 06/07/2020 9:00 A M Medical Record Number: [id number] Patient Account Number: [id number] Date of Birth/Sex: Treating RN: [birth year] (69 y.o. [name] Primary Care Arion Morgan: [name] Other Clinician: Referring Dianelys Scinto: Treating Mirabel Ahlgren/Extender: Christella Hartigan in Treatment: 17 Active Inactive Venous Leg Ulcer Nursing Diagnoses: Knowledge deficit related to disease process and management Potential for venous Insuffiency (use before diagnosis confirmed) Goals: Patient will maintain optimal edema control Date Initiated: 02/09/2020  Target Resolution Date: 07/05/2020 Goal Status: Active Patient/caregiver will verbalize understanding of disease process and disease management Date Initiated: 02/09/2020 Date Inactivated: 03/02/2020 Target Resolution Date: 03/08/2020 Goal Status: Met Interventions: Assess peripheral edema status every visit. Compression as ordered Provide education on venous insufficiency Treatment Activities: Therapeutic compression applied : 02/09/2020 Notes: Wound/Skin Impairment Nursing Diagnoses: Impaired tissue integrity Knowledge deficit related to ulceration/compromised skin integrity Goals: Patient/caregiver will verbalize understanding of skin care regimen Date Initiated: 02/09/2020 Target Resolution Date: 07/05/2020 Goal Status: Active Ulcer/skin breakdown will have a volume reduction of 30% by week 4 Date Initiated: 02/09/2020 Date Inactivated: 03/02/2020 Target Resolution Date: 03/08/2020 Goal Status: Met Interventions: Assess patient/caregiver ability to obtain necessary supplies Assess patient/caregiver ability to perform ulcer/skin care regimen upon admission and as needed Assess  ulceration(s) every visit Provide education on ulcer and skin care Treatment Activities: Skin care regimen initiated : 02/09/2020 Topical wound management initiated : 02/09/2020 Notes: Electronic Signature(s) Signed: 06/08/2020 2:58:18 PM By: [name] RN, BSN Entered By: [name] on 06/07/2020 09:56:55 -------------------------------------------------------------------------------- Pain Assessment Details Patient Name: Date of Service: [name], [place] [name] 06/07/2020 9:00 A M Medical Record Number: [id number] Patient Account Number: [id number] Date of Birth/Sex: Treating RN: Oct 07, 1950 (69 y.o. [name] Primary Care Aryan Bello: [name] Other Clinician: Referring Richel Millspaugh: Treating Jame Morrell/Extender: Christella Hartigan in Treatment: 17 Active Problems Location of Pain Severity and Description of Pain Patient Has Paino No Site Locations Pain Management and Medication Current Pain Management: Electronic Signature(s) Signed: 06/07/2020 11:19:23 AM By: Sandre Kitty Signed: 06/08/2020 2:58:18 PM By: [name] RN, BSN Entered By: Sandre Kitty on 06/07/2020 09:21:32 -------------------------------------------------------------------------------- Patient/Caregiver Education Details Patient Name: Date of Service: [facility] [name], [place] [name] 11/17/2021andnbsp9:00 [place] Record Number: [id number] Patient Account Number: [id number] Date of Birth/Gender: Treating RN: 24-May-1951 (70 y.o. [name] Primary Care Physician: [name] Other Clinician: Referring Physician: Treating Physician/Extender: Christella Hartigan in Treatment: 17 Education Assessment Education Provided To: Patient Education Topics Provided Venous: Methods: Explain/Verbal Responses: Reinforcements needed, State content correctly Wound/Skin Impairment: Methods: Explain/Verbal Responses: Reinforcements needed, State content  correctly Electronic Signature(s) Signed: 06/08/2020 2:58:18 PM By: [name] RN, BSN Entered By: [name] on 06/07/2020 09:57:42 -------------------------------------------------------------------------------- Wound Assessment Details Patient Name: Date of Service: [name], [place] [name] 06/07/2020 9:00 A M Medical Record Number: [id number] Patient Account Number: [id number] Date of Birth/Sex: Treating RN: 15-Feb-1951 (69 y.o. [name] Primary Care Jkayla Spiewak: [name] Other Clinician: Referring Lena Fieldhouse: Treating Blondie Riggsbee/Extender: Christella Hartigan in Treatment: 17 Wound Status Wound Number: 17 Primary Etiology: Diabetic Wound/Ulcer of the Lower Extremity Wound Location: Right, Medial Malleolus Wound Status: Open Wounding Event: Gradually Appeared Date Acquired: 06/07/2020 Weeks Of Treatment: 0 Clustered Wound: No Photos Photo Uploaded By: Mikeal Hawthorne on 06/08/2020 13:20:01 Wound Measurements Length: (cm) Width: (cm) Depth: (cm) Area: (cm) Volume: (cm) 1.1 % Reduction in Area: 0.6 % Reduction in Volume: 0.1 0.518 0.052 Treatment Notes Wound #17 (Right, Medial Malleolus) 1. Cleanse With Wound Cleanser 3. Primary Dressing Applied Collegen AG 4. Secondary Dressing Dry Gauze 6. Support Layer Applied 3 layer compression wrap Notes netting Electronic Signature(s) Signed: 06/07/2020 11:19:23 AM By: Sandre Kitty Signed: 06/08/2020 2:58:18 PM By: [name] RN, BSN Entered By: Sandre Kitty on 06/07/2020 09:24:25 -------------------------------------------------------------------------------- Wound Assessment Details Patient Name: Date of Service: [name], [place] [name] 06/07/2020 9:00 A M Medical Record Number: [id number] Patient Account Number: [id number] Date of Birth/Sex: Treating RN:  1950-12-26 (69 y.o. [name] Primary Care Ivis Henneman: [name] Other Clinician: Referring Haillie Radu: Treating  Lennix Kneisel/Extender: Christella Hartigan in Treatment: 17 Wound Status Wound Number: 18 Primary Etiology: Diabetic Wound/Ulcer of the Lower Extremity Wound Location: Right, Medial Lower Leg Wound Status: Open Wounding Event: Gradually Appeared Date Acquired: 06/07/2020 Weeks Of Treatment: 0 Clustered Wound: No Photos Photo Uploaded By: Mikeal Hawthorne on 06/08/2020 13:19:36 Wound Measurements Length: (cm) Width: (cm) Depth: (cm) Area: (cm) Volume: (cm) 0.6 % Reduction in Area: 0.2 % Reduction in Volume: 0.1 0.094 0.009 Treatment Notes Wound #18 (Right, Medial Lower Leg) 1. Cleanse With Wound Cleanser 3. Primary Dressing Applied Collegen AG 4. Secondary Dressing Dry Gauze 6. Support Layer Applied 3 layer compression wrap Notes netting Electronic Signature(s) Signed: 06/07/2020 11:19:23 AM By: Sandre Kitty Signed: 06/08/2020 2:58:18 PM By: [name] RN, BSN Entered By: Sandre Kitty on 06/07/2020 09:24:26 -------------------------------------------------------------------------------- Wound Assessment Details Patient Name: Date of Service: [name], [place] [name] 06/07/2020 9:00 A M Medical Record Number: [id number] Patient Account Number: [id number] Date of Birth/Sex: Treating RN: 03/27/1951 (69 y.o. [name] Primary Care Chelesa Weingartner: [name] Other Clinician: Referring Leasha Goldberger: Treating Shaila Gilchrest/Extender: Christella Hartigan in Treatment: 17 Wound Status Wound Number: 8 Primary Etiology: Diabetic Wound/Ulcer of the Lower Extremity Wound Location: Left T Third oe Wound Status: Open Wounding Event: Blister Date Acquired: 02/23/2020 Weeks Of Treatment: 15 Clustered Wound: No Photos Photo Uploaded By: Mikeal Hawthorne on 06/08/2020 13:19:36 Wound Measurements Length: (cm) 0.4 Width: (cm) 0.3 Depth: (cm) 0.1 Area: (cm) 0.094 Volume: (cm) 0.009 % Reduction in Area: 33.3% % Reduction in Volume:  35.7% Wound Description Classification: Grade 2 Treatment Notes Wound #8 (Left Toe Third) 1. Cleanse With Wound Cleanser 3. Primary Dressing Applied Collegen AG Dry Gauze Notes normal saline to moisten collagen . netting. Electronic Signature(s) Signed: 06/07/2020 11:19:23 AM By: Sandre Kitty Signed: 06/08/2020 2:58:18 PM By: [name] RN, BSN Entered By: Sandre Kitty on 06/07/2020 09:21:45 -------------------------------------------------------------------------------- Vitals Details Patient Name: Date of Service: [name], [place] [name] 06/07/2020 9:00 A M Medical Record Number: [id number] Patient Account Number: [id number] Date of Birth/Sex: Treating RN: [birth year] (69 y.o. [name] Primary Care Welda Azzarello: [name] Other Clinician: Referring Monai Hindes: Treating Aldeen Riga/Extender: Christella Hartigan in Treatment: 17 Vital Signs Time Taken: 09:20 Temperature (F): 98.1 Height (in): 67 Pulse (bpm): 90 Weight (lbs): 202 Respiratory Rate (breaths/min): 16 Body Mass Index (BMI): 31.6 Blood Pressure (mmHg): 119/74 Reference Range: 80 - 120 mg / dl Electronic Signature(s) Signed: 06/07/2020 11:19:23 AM By: Sandre Kitty Entered By: Sandre Kitty on 06/07/2020 09:21:08

## 2020-06-10 DIAGNOSIS — N186 End stage renal disease: Secondary | ICD-10-CM | POA: Diagnosis not present

## 2020-06-10 DIAGNOSIS — D689 Coagulation defect, unspecified: Secondary | ICD-10-CM | POA: Diagnosis not present

## 2020-06-10 DIAGNOSIS — N2581 Secondary hyperparathyroidism of renal origin: Secondary | ICD-10-CM | POA: Diagnosis not present

## 2020-06-10 DIAGNOSIS — N390 Urinary tract infection, site not specified: Secondary | ICD-10-CM | POA: Diagnosis not present

## 2020-06-10 DIAGNOSIS — R300 Dysuria: Secondary | ICD-10-CM | POA: Diagnosis not present

## 2020-06-10 DIAGNOSIS — Z992 Dependence on renal dialysis: Secondary | ICD-10-CM | POA: Diagnosis not present

## 2020-06-10 DIAGNOSIS — D631 Anemia in chronic kidney disease: Secondary | ICD-10-CM | POA: Diagnosis not present

## 2020-06-11 DIAGNOSIS — N390 Urinary tract infection, site not specified: Secondary | ICD-10-CM | POA: Diagnosis not present

## 2020-06-11 DIAGNOSIS — R300 Dysuria: Secondary | ICD-10-CM | POA: Diagnosis not present

## 2020-06-11 DIAGNOSIS — N186 End stage renal disease: Secondary | ICD-10-CM | POA: Diagnosis not present

## 2020-06-11 DIAGNOSIS — Z992 Dependence on renal dialysis: Secondary | ICD-10-CM | POA: Diagnosis not present

## 2020-06-11 DIAGNOSIS — D631 Anemia in chronic kidney disease: Secondary | ICD-10-CM | POA: Diagnosis not present

## 2020-06-11 DIAGNOSIS — D689 Coagulation defect, unspecified: Secondary | ICD-10-CM | POA: Diagnosis not present

## 2020-06-11 DIAGNOSIS — N2581 Secondary hyperparathyroidism of renal origin: Secondary | ICD-10-CM | POA: Diagnosis not present

## 2020-06-14 ENCOUNTER — Encounter (HOSPITAL_BASED_OUTPATIENT_CLINIC_OR_DEPARTMENT_OTHER): Payer: Medicare Other | Admitting: Physician Assistant

## 2020-06-14 ENCOUNTER — Other Ambulatory Visit: Payer: Self-pay

## 2020-06-14 DIAGNOSIS — E1140 Type 2 diabetes mellitus with diabetic neuropathy, unspecified: Secondary | ICD-10-CM | POA: Diagnosis not present

## 2020-06-14 DIAGNOSIS — N186 End stage renal disease: Secondary | ICD-10-CM | POA: Diagnosis not present

## 2020-06-14 DIAGNOSIS — I120 Hypertensive chronic kidney disease with stage 5 chronic kidney disease or end stage renal disease: Secondary | ICD-10-CM | POA: Diagnosis not present

## 2020-06-14 DIAGNOSIS — E11622 Type 2 diabetes mellitus with other skin ulcer: Secondary | ICD-10-CM | POA: Diagnosis not present

## 2020-06-14 DIAGNOSIS — L97526 Non-pressure chronic ulcer of other part of left foot with bone involvement without evidence of necrosis: Secondary | ICD-10-CM | POA: Diagnosis not present

## 2020-06-14 DIAGNOSIS — D689 Coagulation defect, unspecified: Secondary | ICD-10-CM | POA: Diagnosis not present

## 2020-06-14 DIAGNOSIS — E11621 Type 2 diabetes mellitus with foot ulcer: Secondary | ICD-10-CM | POA: Diagnosis not present

## 2020-06-14 DIAGNOSIS — L97312 Non-pressure chronic ulcer of right ankle with fat layer exposed: Secondary | ICD-10-CM | POA: Diagnosis not present

## 2020-06-14 DIAGNOSIS — L97812 Non-pressure chronic ulcer of other part of right lower leg with fat layer exposed: Secondary | ICD-10-CM | POA: Diagnosis not present

## 2020-06-14 DIAGNOSIS — L97822 Non-pressure chronic ulcer of other part of left lower leg with fat layer exposed: Secondary | ICD-10-CM | POA: Diagnosis not present

## 2020-06-14 DIAGNOSIS — I872 Venous insufficiency (chronic) (peripheral): Secondary | ICD-10-CM | POA: Diagnosis not present

## 2020-06-14 DIAGNOSIS — D631 Anemia in chronic kidney disease: Secondary | ICD-10-CM | POA: Diagnosis not present

## 2020-06-14 DIAGNOSIS — N2581 Secondary hyperparathyroidism of renal origin: Secondary | ICD-10-CM | POA: Diagnosis not present

## 2020-06-14 DIAGNOSIS — N390 Urinary tract infection, site not specified: Secondary | ICD-10-CM | POA: Diagnosis not present

## 2020-06-14 DIAGNOSIS — L97522 Non-pressure chronic ulcer of other part of left foot with fat layer exposed: Secondary | ICD-10-CM | POA: Diagnosis not present

## 2020-06-14 DIAGNOSIS — L97512 Non-pressure chronic ulcer of other part of right foot with fat layer exposed: Secondary | ICD-10-CM | POA: Diagnosis not present

## 2020-06-14 DIAGNOSIS — E1122 Type 2 diabetes mellitus with diabetic chronic kidney disease: Secondary | ICD-10-CM | POA: Diagnosis not present

## 2020-06-14 DIAGNOSIS — Z992 Dependence on renal dialysis: Secondary | ICD-10-CM | POA: Diagnosis not present

## 2020-06-14 DIAGNOSIS — R300 Dysuria: Secondary | ICD-10-CM | POA: Diagnosis not present

## 2020-06-14 NOTE — Progress Notes (Addendum)
[name] ([id number]) Visit Report for 06/14/2020 Chief Complaint Document Details Patient Name: Date of Service: [name] [name], [name] 06/14/2020 1:00 PM Medical Record Number: [id number] Patient Account Number: [id number] Date of Birth/Sex: Treating RN: 1950/10/29 (69 y.o. [name] Primary Care Provider: [name] Other Clinician: Referring Provider: Treating Provider/Extender: Christella Hartigan in Treatment: 18 Information Obtained from: Patient Chief Complaint Multiple LE Ulcers Electronic Signature(s) Signed: 06/14/2020 1:25:26 PM By: [name] PA-C Entered By: [name] on 06/14/2020 13:25:25 -------------------------------------------------------------------------------- Debridement Details Patient Name: Date of Service: [facility] [name], [place] [name] 06/14/2020 1:00 PM Medical Record Number: [id number] Patient Account Number: [id number] Date of Birth/Sex: Treating RN: Jul 03, 1951 (69 y.o. [name] Primary Care Provider: [name] Other Clinician: Referring Provider: Treating Provider/Extender: Christella Hartigan in Treatment: 18 Debridement Performed for Assessment: Wound #8 Left T Third oe Performed By: Physician [name], PA Debridement Type: Debridement Severity of Tissue Pre Debridement: Bone involvement without necrosis Level of Consciousness (Pre-procedure): Awake and Alert Pre-procedure Verification/Time Out Yes - 14:00 Taken: Start Time: 14:00 Pain Control: Lidocaine 4% T opical Solution T Area Debrided (L x W): otal 0.4 (cm) x 0.4 (cm) = 0.16 (cm) Tissue and other material debrided: Slough, Subcutaneous, Fibrin/Exudate, Slough Level: Skin/Subcutaneous Tissue Debridement Description: Excisional Instrument: Curette Bleeding: Minimum Hemostasis Achieved: Pressure End Time: 14:02 Procedural Pain: 0 Post Procedural Pain: 0 Response to Treatment: Procedure was tolerated  well Level of Consciousness (Post- Awake and Alert procedure): Post Debridement Measurements of Total Wound Length: (cm) 0.4 Width: (cm) 0.4 Depth: (cm) 0.1 Volume: (cm) 0.013 Character of Wound/Ulcer Post Debridement: Requires Further Debridement Severity of Tissue Post Debridement: Bone involvement without necrosis Post Procedure Diagnosis Same as Pre-procedure Electronic Signature(s) Signed: 06/14/2020 4:58:27 PM By: [name] RN, BSN Signed: 06/14/2020 5:04:05 PM By: [name] PA-C Entered By: [name] on 06/14/2020 14:02:04 -------------------------------------------------------------------------------- HPI Details Patient Name: Date of Service: [name], [place] [name] 06/14/2020 1:00 PM Medical Record Number: [id number] Patient Account Number: [id number] Date of Birth/Sex: Treating RN: 22-Sep-1950 (69 y.o. [name] Primary Care Provider: [name] Other Clinician: Referring Provider: Treating Provider/Extender: Christella Hartigan in Treatment: 18 History of Present Illness HPI Description: Evaluate7/21/2021 today patient presents for evaluation of ulcerations on her legs which she tells me tend to come and go as far as small blistering locations and sometimes are better than other times. Right now she tells me that this is actually doing a little bit better but nonetheless will not completely go away. She has ulcerations bilaterally on her lower extremities. The right is worse than the left. 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 The patient has no evidence of active infection at this time. She however appears to have proficient blood flow into the extremities and I think would tolerate a 3 layer compression wrap she was noncompressible on arterial studies. Nonetheless there was no obvious signs of  occlusion. 02/16/2020 on evaluation today patient appears to be doing much better in regard to her wounds in general. On the past week she has made significant improvements which is great news there is no signs of active infection at this time. No fevers, chills, nausea, vomiting, or diarrhea. 02/23/2020 on evaluation today patient appears to be doing well in regard to her lower extremity ulcers and ankle ulcers. Overall  I feel like she is making great progress and in general I am extremely pleased with how things stand. There is no sign of active infection at this time which is also good news. She will require slight debridement around the right ankle region. 03/02/20-Patient returns at 1 week, has few new areas open on the left leg, especially 1 healed area on the medial ankle that has slight small open area now, to anterior leg areas that started out as blisters that are open now. She was using juxta lite compression to the left, she used Band-Aid to cover the vesicle that had ruptured on the left anterior leg, we are using 3 layer compression on the right. We are using silver alginate to the toe wound on the left. 03/08/2020 upon evaluation today patient unfortunately is doing much worse in regard to her right leg although her left leg looks like is doing better. Again we have taken her compression wraps off last week since she was doing well and transitioned her to her Velcro compression wrap. However apparently the patient is stating this was not put on here in the clinic and she never put it on at home. With that being said unfortunately she developed swelling and opened up new wounds which are present today she has quite a few of them in fact. 03/15/2020 on evaluation today patient appears to be doing well with regard to her wounds on the lower extremities bilaterally. Fortunately there is no signs of active infection at this time. No fever chills noted. Overall I am actually very pleased with where  things stand currently. 03/22/2020 on evaluation today patient appears to be doing fairly well in regard to her wounds in general. She just has really one area left on her toe everything else is healed on her legs. She does have her Velcro compression wraps which I think are appropriate at this point. Fortunately there is no signs of active infection at this time. No fevers, chills, nausea, vomiting, or diarrhea. 03/29/2020 on evaluation today patient appears to be doing poorly in regard to her right leg which is pretty much completely reopened compared to last week. She states it was okay until Saturday when she woke up she had blisters. With that being said it appears that this is likely stemming from the fact that the patient is apparently though I just found this out today going to sleep in her bed with her legs up on the bed with her but throughout the night she tends to end up with her legs hanging off the side or bottom of the bed on the floor. With that being said I think that this likely is happening much too long and then she has significant swelling the builds up as she sleeps. Obviously I think this is the reasoning behind what we are seeing currently. 04/05/2020 on evaluation today patient appears to be doing quite well with regard to her leg ulcers at this point. Fortunately there is no signs of anything significantly open again which is great news nonetheless I am to continue wrapping her for at least a couple of weeks due to the fact that she seems to continually reopen as soon as we unwrapped her. 04/12/2020 upon evaluation today patient appears to be doing excellent in regard to her leg ulcers all appear to be improving which is great news. She is also doing better in regard to the toe the infection seems to be under better control and the Santyl is helping to  clean this up. Overall very pleased much more so than what I saw last week. 04/19/2020 on evaluation today patient appears to be  doing well with regard to her leg ulcers which are showing no signs of opening at this point. Her toe is also showing signs of improvement she is about done with her antibiotic. Overall I feel like things are doing quite well. 04/26/2020 upon evaluation today patient appears to be doing unfortunately a little bit worse compared to previous. She has reopened wounds on the right anterior lower extremity. This is unfortunate as we were hoping that this would really maintain healing. That does not appear to be the case. I think with him the need to see about making a referral for venous studies and provider evaluation with [facility] imaging with the interventional radiologist. 08/11/2019 upon evaluation today patient appears to be doing well in general in regard to her legs. Her right leg she does have a new wound that opened since last week. On the left leg everything appears to be healed she does still have the opening on her toe which we are managing but again this does not appear to be any worse. She just needs to get this to feeling and cover over. There is no evidence of infection which is at least great news. 05/17/2020 upon evaluation today patient appears to be doing decently well in regard to her leg ulcers this is good news. She does have still an opening on the left second toe. She has been let this dry out that not really what we want to see currently. For that reason I discussed with her again the fact that she needs to keep this covered and not allow it to dry out in order to allow for appropriate healing. She voiced understanding and states she did not know that. 05/24/2020 upon evaluation today patient's legs both appear to be still be completely healed which is great news. We are now going to transition into having her utilize her juxta lite compression wraps on the bilateral lower extremities. She is definitely ready for this. Her toe also seems to be doing better. 05/31/2020 on evaluation  today patient appears to be doing well with regard to her legs. There is nothing open at this point. Her toe still has some slough noted and get a clean that off today by way of sharp debridement other than that I feel like the collagen is probably still her best bet in that regard. 06/07/2020 upon evaluation today patient appears to be doing well with regard to her left leg and her toe ulcer the toe seems to be doing better the leg is doing okay. The right leg unfortunately she has new wounds I think her shoes are rubbing on the side of her ankle which is what is causing part of this issue. Fortunately there is no signs of active infection at this time which is great news. 06/14/2020 upon evaluation today patient appears to be doing well today in regard to most of her wounds. Everything seems to be showing some signs of improvement or else healing. Fortunately there is no signs of active infection at this time. No fevers, chills, nausea, vomiting, or diarrhea. She has been using a silver collagen for her toe ulcer. Electronic Signature(s) Signed: 06/14/2020 2:05:30 PM By: [name] PA-C Entered By: [name] on 06/14/2020 14:05:30 -------------------------------------------------------------------------------- Physical Exam Details Patient Name: Date of Service: [facility] [name], [place] [name] 06/14/2020 1:00 PM Medical Record Number: [id number]  Patient Account Number: [id number] Date of Birth/Sex: Treating RN: [birth year] (69 y.o. [name] Primary Care Provider: [name] Other Clinician: Referring Provider: Treating Provider/Extender: Christella Hartigan in Treatment: 42 Constitutional Well-nourished and well-hydrated in no acute distress. Respiratory normal breathing without difficulty. Psychiatric this patient is able to make decisions and demonstrates good insight into disease process. Alert and Oriented x 3. pleasant and cooperative. Notes Patient's  wound bed actually showed signs of good granulation at this time. There does not appear to be any evidence of active infection I did not perform some sharp debridement on the toe where the bone is covering over and everything seems to be doing quite well to be honest. Electronic Signature(s) Signed: 06/14/2020 2:05:47 PM By: [name] PA-C Entered By: [name] on 06/14/2020 14:05:46 -------------------------------------------------------------------------------- Physician Orders Details Patient Name: Date of Service: [facility] [name], [place] [name] 06/14/2020 1:00 PM Medical Record Number: [id number] Patient Account Number: [id number] Date of Birth/Sex: Treating RN: 09/28/1950 (70 y.o. [name] Primary Care Provider: [name] Other Clinician: Referring Provider: Treating Provider/Extender: Christella Hartigan in Treatment: [phone] Verbal / Phone Orders: No Diagnosis Coding ICD-10 Coding Code Description I87.2 Venous insufficiency (chronic) (peripheral) E11.621 Type 2 diabetes mellitus with foot ulcer L97.522 Non-pressure chronic ulcer of other part of left foot with fat layer exposed N18.6 End stage renal disease Z99.2 Dependence on renal dialysis I10 Essential (primary) hypertension Follow-up Appointments Return Appointment in 1 week. Dressing Change Frequency Wound #17 Right,Medial Malleolus Do not change entire dressing for one week. Wound #8 Left T Third oe Change Dressing every other day. Skin Barriers/Peri-Wound Care Moisturizing lotion - to both legs daily Wound Cleansing Wound #8 Left T Third oe May shower and wash wound with soap and water. Primary Wound Dressing Wound #17 Right,Medial Malleolus Silver Collagen - moisten with saline Wound #8 Left T Third oe Silver Collagen - moisten with saline Secondary Dressing Wound #17 Right,Medial Malleolus Dry Gauze Wound #8 Left T Third oe Kerlix/Rolled Gauze Dry Gauze Edema Control 3  Layer Compression System - Right Lower Extremity Avoid standing for long periods of time Elevate legs to the level of the heart or above for 30 minutes daily and/or when sitting, a frequency of: - throughout the day Exercise regularly Support Garment 20-30 mm/Hg pressure to: - juxtalite compression garments left leg daily Patient Medications llergies: penicillin, adhesive tape A Notifications Medication Indication Start End prior to debridement 06/14/2020 lidocaine DOSE topical 4 % cream - cream topical Electronic Signature(s) Signed: 06/14/2020 4:58:27 PM By: [name] RN, BSN Signed: 06/14/2020 5:04:05 PM By: [name] PA-C Entered By: [name] on 06/14/2020 14:03:19 -------------------------------------------------------------------------------- Problem List Details Patient Name: Date of Service: [facility] [name], [place] [name] 06/14/2020 1:00 PM Medical Record Number: [id number] Patient Account Number: [id number] Date of Birth/Sex: Treating RN: 11-05-1950 (69 y.o. [name] Primary Care Provider: Other Clinician: [name] Referring Provider: Treating Provider/Extender: Christella Hartigan in Treatment: [phone] Active Problems ICD-10 Encounter Code Description Active Date MDM Diagnosis I87.2 Venous insufficiency (chronic) (peripheral) 02/09/2020 No Yes E11.621 Type 2 diabetes mellitus with foot ulcer 02/09/2020 No Yes L97.522 Non-pressure chronic ulcer of other part of left foot with fat layer exposed 05/31/2020 No Yes N18.6 End stage renal disease 02/09/2020 No Yes Z99.2 Dependence on renal dialysis 02/09/2020 No Yes I10 Essential (primary) hypertension 02/09/2020 No Yes Inactive Problems Resolved Problems ICD-10 Code Description Active Date Resolved Date L97.822 Non-pressure  chronic ulcer of other part of left lower leg with fat layer exposed 02/09/2020 02/09/2020 L97.312 Non-pressure chronic ulcer of right ankle with fat layer exposed  02/09/2020 02/09/2020 L97.812 Non-pressure chronic ulcer of other part of right lower leg with fat layer exposed 02/09/2020 02/09/2020 L97.512 Non-pressure chronic ulcer of other part of right foot with fat layer exposed 02/09/2020 02/09/2020 Electronic Signature(s) Signed: 06/14/2020 1:25:20 PM By: [name] PA-C Entered By: [name] on 06/14/2020 13:25:19 -------------------------------------------------------------------------------- Progress Note Details Patient Name: Date of Service: [facility] [name], [place] [name] 06/14/2020 1:00 PM Medical Record Number: [id number] Patient Account Number: [id number] Date of Birth/Sex: Treating RN: 17-Apr-1951 (69 y.o. [name] Primary Care Provider: [name] Other Clinician: Referring Provider: Treating Provider/Extender: Christella Hartigan in Treatment: 18 Subjective Chief Complaint Information obtained from Patient Multiple LE Ulcers History of Present Illness (HPI) Evaluate7/21/2021 today patient presents for evaluation of ulcerations on her legs which she tells me tend to come and go as far as small blistering locations and sometimes are better than other times. Right now she tells me that this is actually doing a little bit better but nonetheless will not completely go away. She has ulcerations bilaterally on her lower extremities. The right is worse than the left. 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 The patient has no evidence of active infection at this time. She however appears to have proficient blood flow into the extremities and I think would tolerate a 3 layer compression wrap she was noncompressible on arterial studies. Nonetheless there was no obvious signs of occlusion. 02/16/2020 on evaluation today patient appears to be doing much better in regard to her wounds in general. On the  past week she has made significant improvements which is great news there is no signs of active infection at this time. No fevers, chills, nausea, vomiting, or diarrhea. 02/23/2020 on evaluation today patient appears to be doing well in regard to her lower extremity ulcers and ankle ulcers. Overall I feel like she is making great progress and in general I am extremely pleased with how things stand. There is no sign of active infection at this time which is also good news. She will require slight debridement around the right ankle region. 03/02/20-Patient returns at 1 week, has few new areas open on the left leg, especially 1 healed area on the medial ankle that has slight small open area now, to anterior leg areas that started out as blisters that are open now. She was using juxta lite compression to the left, she used Band-Aid to cover the vesicle that had ruptured on the left anterior leg, we are using 3 layer compression on the right. We are using silver alginate to the toe wound on the left. 03/08/2020 upon evaluation today patient unfortunately is doing much worse in regard to her right leg although her left leg looks like is doing better. Again we have taken her compression wraps off last week since she was doing well and transitioned her to her Velcro compression wrap. However apparently the patient is stating this was not put on here in the clinic and she never put it on at home. With that being said unfortunately she developed swelling and opened up new wounds which are present today she has quite a few of them in fact. 03/15/2020 on evaluation today patient appears to be doing well with regard  to her wounds on the lower extremities bilaterally. Fortunately there is no signs of active infection at this time. No fever chills noted. Overall I am actually very pleased with where things stand currently. 03/22/2020 on evaluation today patient appears to be doing fairly well in regard to her wounds in  general. She just has really one area left on her toe everything else is healed on her legs. She does have her Velcro compression wraps which I think are appropriate at this point. Fortunately there is no signs of active infection at this time. No fevers, chills, nausea, vomiting, or diarrhea. 03/29/2020 on evaluation today patient appears to be doing poorly in regard to her right leg which is pretty much completely reopened compared to last week. She states it was okay until Saturday when she woke up she had blisters. With that being said it appears that this is likely stemming from the fact that the patient is apparently though I just found this out today going to sleep in her bed with her legs up on the bed with her but throughout the night she tends to end up with her legs hanging off the side or bottom of the bed on the floor. With that being said I think that this likely is happening much too long and then she has significant swelling the builds up as she sleeps. Obviously I think this is the reasoning behind what we are seeing currently. 04/05/2020 on evaluation today patient appears to be doing quite well with regard to her leg ulcers at this point. Fortunately there is no signs of anything significantly open again which is great news nonetheless I am to continue wrapping her for at least a couple of weeks due to the fact that she seems to continually reopen as soon as we unwrapped her. 04/12/2020 upon evaluation today patient appears to be doing excellent in regard to her leg ulcers all appear to be improving which is great news. She is also doing better in regard to the toe the infection seems to be under better control and the Santyl is helping to clean this up. Overall very pleased much more so than what I saw last week. 04/19/2020 on evaluation today patient appears to be doing well with regard to her leg ulcers which are showing no signs of opening at this point. Her toe is also showing signs  of improvement she is about done with her antibiotic. Overall I feel like things are doing quite well. 04/26/2020 upon evaluation today patient appears to be doing unfortunately a little bit worse compared to previous. She has reopened wounds on the right anterior lower extremity. This is unfortunate as we were hoping that this would really maintain healing. That does not appear to be the case. I think with him the need to see about making a referral for venous studies and provider evaluation with [facility] imaging with the interventional radiologist. 08/11/2019 upon evaluation today patient appears to be doing well in general in regard to her legs. Her right leg she does have a new wound that opened since last week. On the left leg everything appears to be healed she does still have the opening on her toe which we are managing but again this does not appear to be any worse. She just needs to get this to feeling and cover over. There is no evidence of infection which is at least great news. 05/17/2020 upon evaluation today patient appears to be doing decently well in regard to her  leg ulcers this is good news. She does have still an opening on the left second toe. She has been let this dry out that not really what we want to see currently. For that reason I discussed with her again the fact that she needs to keep this covered and not allow it to dry out in order to allow for appropriate healing. She voiced understanding and states she did not know that. 05/24/2020 upon evaluation today patient's legs both appear to be still be completely healed which is great news. We are now going to transition into having her utilize her juxta lite compression wraps on the bilateral lower extremities. She is definitely ready for this. Her toe also seems to be doing better. 05/31/2020 on evaluation today patient appears to be doing well with regard to her legs. There is nothing open at this point. Her toe still has some  slough noted and get a clean that off today by way of sharp debridement other than that I feel like the collagen is probably still her best bet in that regard. 06/07/2020 upon evaluation today patient appears to be doing well with regard to her left leg and her toe ulcer the toe seems to be doing better the leg is doing okay. The right leg unfortunately she has new wounds I think her shoes are rubbing on the side of her ankle which is what is causing part of this issue. Fortunately there is no signs of active infection at this time which is great news. 06/14/2020 upon evaluation today patient appears to be doing well today in regard to most of her wounds. Everything seems to be showing some signs of improvement or else healing. Fortunately there is no signs of active infection at this time. No fevers, chills, nausea, vomiting, or diarrhea. She has been using a silver collagen for her toe ulcer. Objective Constitutional Well-nourished and well-hydrated in no acute distress. Vitals Time Taken: 1:15 PM, Height: 67 in, Weight: 202 lbs, BMI: 31.6, Temperature: 98.2 F, Pulse: 103 bpm, Respiratory Rate: 16 breaths/min, Blood Pressure: 93/58 mmHg. Respiratory normal breathing without difficulty. Psychiatric this patient is able to make decisions and demonstrates good insight into disease process. Alert and Oriented x 3. pleasant and cooperative. General Notes: Patient's wound bed actually showed signs of good granulation at this time. There does not appear to be any evidence of active infection I did not perform some sharp debridement on the toe where the bone is covering over and everything seems to be doing quite well to be honest. Integumentary (Hair, Skin) Wound #17 status is Open. Original cause of wound was Gradually Appeared. The wound is located on the Right,Medial Malleolus. The wound measures 0.7cm length x 0.5cm width x 0.1cm depth; 0.275cm^2 area and 0.027cm^3 volume. There is Fat Layer  (Subcutaneous Tissue) exposed. There is no tunneling or undermining noted. There is a medium amount of serosanguineous drainage noted. The wound margin is flat and intact. There is large (67-100%) pink granulation within the wound bed. There is a small (1-33%) amount of necrotic tissue within the wound bed including Adherent Slough. Wound #18 status is Open. Original cause of wound was Gradually Appeared. The wound is located on the Right,Medial Lower Leg. The wound measures 0cm length x 0cm width x 0cm depth; 0cm^2 area and 0cm^3 volume. There is no tunneling or undermining noted. There is a none present amount of drainage noted. The wound margin is flat and intact. There is no granulation within the wound bed.  There is no necrotic tissue within the wound bed. Wound #8 status is Open. Original cause of wound was Blister. The wound is located on the Left T Third. The wound measures 0.4cm length x 0.4cm width x oe 0.2cm depth; 0.126cm^2 area and 0.025cm^3 volume. There is Fat Layer (Subcutaneous Tissue) exposed. There is no tunneling or undermining noted. There is a medium amount of serosanguineous drainage noted. The wound margin is flat and intact. There is medium (34-66%) pink granulation within the wound bed. There is a medium (34-66%) amount of necrotic tissue within the wound bed including Adherent Slough. Assessment Active Problems ICD-10 Venous insufficiency (chronic) (peripheral) Type 2 diabetes mellitus with foot ulcer Non-pressure chronic ulcer of other part of left foot with fat layer exposed End stage renal disease Dependence on renal dialysis Essential (primary) hypertension Procedures Wound #8 Pre-procedure diagnosis of Wound #8 is a Diabetic Wound/Ulcer of the Lower Extremity located on the Left T Third .Severity of Tissue Pre Debridement is: oe Bone involvement without necrosis. There was a Excisional Skin/Subcutaneous Tissue Debridement with a total area of 0.16 sq cm  performed by [name], PA. With the following instrument(s): Curette Material removed includes Subcutaneous Tissue, Slough, and Fibrin/Exudate after achieving pain control using Lidocaine 4% T opical Solution. No specimens were taken. A time out was conducted at 14:00, prior to the start of the procedure. A Minimum amount of bleeding was controlled with Pressure. The procedure was tolerated well with a pain level of 0 throughout and a pain level of 0 following the procedure. Post Debridement Measurements: 0.4cm length x 0.4cm width x 0.1cm depth; 0.013cm^3 volume. Character of Wound/Ulcer Post Debridement requires further debridement. Severity of Tissue Post Debridement is: Bone involvement without necrosis. Post procedure Diagnosis Wound #8: Same as Pre-Procedure Wound #17 Pre-procedure diagnosis of Wound #17 is a Diabetic Wound/Ulcer of the Lower Extremity located on the Right,Medial Malleolus . There was a Three Layer Compression Therapy Procedure by [name], RN. Post procedure Diagnosis Wound #17: Same as Pre-Procedure Plan Follow-up Appointments: Return Appointment in 1 week. Dressing Change Frequency: Wound #17 Right,Medial Malleolus: Do not change entire dressing for one week. Wound #8 Left T Third: oe Change Dressing every other day. Skin Barriers/Peri-Wound Care: Moisturizing lotion - to both legs daily Wound Cleansing: Wound #8 Left T Third: oe May shower and wash wound with soap and water. Primary Wound Dressing: Wound #17 Right,Medial Malleolus: Silver Collagen - moisten with saline Wound #8 Left T Third: oe Silver Collagen - moisten with saline Secondary Dressing: Wound #17 Right,Medial Malleolus: Dry Gauze Wound #8 Left T Third: oe Kerlix/Rolled Gauze Dry Gauze Edema Control: 3 Layer Compression System - Right Lower Extremity Avoid standing for long periods of time Elevate legs to the level of the heart or above for 30 minutes daily and/or when  sitting, a frequency of: - throughout the day Exercise regularly Support Garment 20-30 mm/Hg pressure to: - juxtalite compression garments left leg daily The following medication(s) was prescribed: lidocaine topical 4 % cream cream topical for prior to debridement was prescribed at facility 1. I would recommend currently we continue with the wound care measures as before utilizing the collagen to the toe ulcer as well as the other ulcerations on her right leg. 2. We will continue with the compression wrap to the right lower extremity as it seems to be doing well for her this 3 layer compression wrap. We will see patient back for reevaluation in 1 week here in the clinic.  If anything worsens or changes patient will contact our office for additional recommendations. I did advise the patient she needs to discontinue picking at herself she tends to do this chronically causing multiple wounds over her arms and legs bilaterally. Electronic Signature(s) Signed: 06/14/2020 2:06:28 PM By: [name] PA-C Entered By: [name] on 06/14/2020 14:06:28 -------------------------------------------------------------------------------- SuperBill Details Patient Name: Date of Service: [facility] [name], [place] [name] 06/14/2020 Medical Record Number: [id number] Patient Account Number: [id number] Date of Birth/Sex: Treating RN: [birth year] (69 y.o. [name] Primary Care Provider: [name] Other Clinician: Referring Provider: Treating Provider/Extender: Christella Hartigan in Treatment: 18 Diagnosis Coding ICD-10 Codes Code Description I87.2 Venous insufficiency (chronic) (peripheral) E11.621 Type 2 diabetes mellitus with foot ulcer L97.522 Non-pressure chronic ulcer of other part of left foot with fat layer exposed N18.6 End stage renal disease Z99.2 Dependence on renal dialysis I10 Essential (primary) hypertension Facility Procedures CPT4 Code: 35009381 Description:  82993 - DEB SUBQ TISSUE 20 SQ CM/< ICD-10 Diagnosis Description L97.522 Non-pressure chronic ulcer of other part of left foot with fat layer exposed Modifier: Quantity: 1 CPT4 Code: 71696789 Description: (Facility Use Only) 38101BP - Wheeler LWR RT LEG Modifier: Quantity: 1 Physician Procedures : CPT4 Code Description Modifier [id number] 77824 - WC PHYS SUBQ TISS 20 SQ CM ICD-10 Diagnosis Description L97.522 Non-pressure chronic ulcer of other part of left foot with fat layer exposed Quantity: 1 Electronic Signature(s) Signed: 06/14/2020 2:06:42 PM By: [name] PA-C Entered By: [name] on 06/14/2020 14:06:41

## 2020-06-17 DIAGNOSIS — R300 Dysuria: Secondary | ICD-10-CM | POA: Diagnosis not present

## 2020-06-17 DIAGNOSIS — N186 End stage renal disease: Secondary | ICD-10-CM | POA: Diagnosis not present

## 2020-06-17 DIAGNOSIS — N390 Urinary tract infection, site not specified: Secondary | ICD-10-CM | POA: Diagnosis not present

## 2020-06-17 DIAGNOSIS — D631 Anemia in chronic kidney disease: Secondary | ICD-10-CM | POA: Diagnosis not present

## 2020-06-17 DIAGNOSIS — Z992 Dependence on renal dialysis: Secondary | ICD-10-CM | POA: Diagnosis not present

## 2020-06-17 DIAGNOSIS — N2581 Secondary hyperparathyroidism of renal origin: Secondary | ICD-10-CM | POA: Diagnosis not present

## 2020-06-17 DIAGNOSIS — D689 Coagulation defect, unspecified: Secondary | ICD-10-CM | POA: Diagnosis not present

## 2020-06-20 DIAGNOSIS — N2581 Secondary hyperparathyroidism of renal origin: Secondary | ICD-10-CM | POA: Diagnosis not present

## 2020-06-20 DIAGNOSIS — Z992 Dependence on renal dialysis: Secondary | ICD-10-CM | POA: Diagnosis not present

## 2020-06-20 DIAGNOSIS — D689 Coagulation defect, unspecified: Secondary | ICD-10-CM | POA: Diagnosis not present

## 2020-06-20 DIAGNOSIS — D631 Anemia in chronic kidney disease: Secondary | ICD-10-CM | POA: Diagnosis not present

## 2020-06-20 DIAGNOSIS — I129 Hypertensive chronic kidney disease with stage 1 through stage 4 chronic kidney disease, or unspecified chronic kidney disease: Secondary | ICD-10-CM | POA: Diagnosis not present

## 2020-06-20 DIAGNOSIS — R300 Dysuria: Secondary | ICD-10-CM | POA: Diagnosis not present

## 2020-06-20 DIAGNOSIS — N186 End stage renal disease: Secondary | ICD-10-CM | POA: Diagnosis not present

## 2020-06-20 DIAGNOSIS — N390 Urinary tract infection, site not specified: Secondary | ICD-10-CM | POA: Diagnosis not present

## 2020-06-20 NOTE — Progress Notes (Signed)
[name], [name] ([id number]) Visit Report for 06/14/2020 Arrival Information Details Patient Name: Date of Service: [name] [name], [name] [name] K. 06/14/2020 1:00 PM Medical Record Number: [id number] Patient Account Number: [id number] Date of Birth/Sex: Treating RN: [birth year] (69 y.o. [name] Primary Care [name]: [name] Other Clinician: Referring [name]: Treating [name]/Extender: [name] in Treatment: 18 Visit Information History Since Last Visit Added or deleted any medications: No Patient Arrived: Wheel Chair Any [name] allergies or adverse reactions: No Arrival Time: 13:12 Had a [name] or experienced change in No Accompanied By: alone activities of daily living that may affect Transfer Assistance: None risk of falls: Secondary Verification Process Completed: Yes Signs or symptoms of abuse/neglect since last visito No Patient Requires Transmission-Based Precautions: No Hospitalized since last visit: No Patient Has Alerts: Yes Implantable device outside of the clinic excluding No Patient Alerts: Right ABI: [name] cellular tissue based products placed in the [name] Left ABI: [name] since last visit: Has Dressing in Place as Prescribed: Yes Has Compression in Place as Prescribed: Yes Pain Present Now: No Electronic Signature(s) Signed: 06/20/2020 6:12:47 PM By: [name] Entered By: [name] on 06/14/2020 13:12:22 -------------------------------------------------------------------------------- Compression Therapy Details Patient Name: Date of Service: [name] [name], [name] [name] K. 06/14/2020 1:00 PM Medical Record Number: [id number] Patient Account Number: [id number] Date of Birth/Sex: Treating RN: 1951/04/05 (69 y.o. [name] Primary Care [name]: [name] Other Clinician: Referring [name]: Treating [name]/Extender: [name] in Treatment: 18 Compression Therapy Performed for Wound  Assessment: Wound #17 Right,Medial Malleolus Performed By: [name] Church, RN Compression Type: Three Layer Post Procedure Diagnosis Same as Pre-procedure Electronic Signature(s) Signed: 06/14/2020 4:58:27 PM By: [name] Gouty RN, [name] Entered By: [name] on 06/14/2020 13:59:28 -------------------------------------------------------------------------------- Encounter Discharge Information Details Patient Name: Date of Service: [name] [name], [name] [name] K. 06/14/2020 1:00 PM Medical Record Number: [id number] Patient Account Number: [id number] Date of Birth/Sex: Treating RN: 08/18/1950 (69 y.o. [name] Primary Care [name]: [name] Other Clinician: Referring [name]: Treating [name]/Extender: [name] in Treatment: 18 Encounter Discharge Information Items Post Procedure Vitals Discharge Condition: Stable Temperature (F): 98.2 Ambulatory Status: Wheelchair Pulse (bpm): 103 Discharge Destination: Home Respiratory Rate (breaths/min): 16 Transportation: Private Auto Blood Pressure (mmHg): 93/58 Accompanied By: self Schedule Follow-up Appointment: Yes Clinical Summary of Care: Electronic Signature(s) Signed: 06/14/2020 4:29:16 PM By: [name] Entered By: [name] on 06/14/2020 14:14:39 -------------------------------------------------------------------------------- Lower Extremity Assessment Details Patient Name: Date of Service: [name] [name], [name] [name] K. 06/14/2020 1:00 PM Medical Record Number: [id number] Patient Account Number: [id number] Date of Birth/Sex: Treating RN: [birth year] (69 y.o. [name] Primary Care [name]: [name] Other Clinician: Referring Kami Kube: Treating [name]/Extender: [name] in Treatment: 18 Edema Assessment Assessed: [Left: No] [Right: No] Edema: [Left: No] [Right: No] Calf Left: Right: Point of Measurement: 38 cm From Medial Instep 32  cm Ankle Left: Right: Point of Measurement: 13 cm From Medial Instep 22 cm Vascular Assessment Pulses: Dorsalis Pedis Palpable: [Left:Yes] Electronic Signature(s) Signed: 06/20/2020 6:12:47 PM By: [name] Entered By: [name] on 06/14/2020 13:24:26 -------------------------------------------------------------------------------- Multi-Disciplinary Care Plan Details Patient Name: Date of Service: [name] [name], [name] [name] K. 06/14/2020 1:00 PM Medical Record Number: [id number] Patient Account Number: [id number] Date of Birth/Sex: Treating RN: [birth year] (70 y.o. [name] Primary Care [name]: [name] Other Clinician: Referring [name]: Treating [name]/Extender: [name] in Treatment: [phone]  Active Inactive Venous Leg Ulcer Nursing Diagnoses: Knowledge deficit related to disease process and management Potential for venous Insuffiency (use before diagnosis confirmed) Goals: Patient will maintain optimal edema control Date Initiated: 02/09/2020 Target Resolution Date: 07/05/2020 Goal Status: Active Patient/caregiver will verbalize understanding of disease process and disease management Date Initiated: 02/09/2020 Date Inactivated: 03/02/2020 Target Resolution Date: 03/08/2020 Goal Status: Met Interventions: Assess peripheral edema status every visit. Compression as ordered Provide education on venous insufficiency Treatment Activities: Therapeutic compression applied : 02/09/2020 Notes: Wound/Skin Impairment Nursing Diagnoses: Impaired tissue integrity Knowledge deficit related to ulceration/compromised skin integrity Goals: Patient/caregiver will verbalize understanding of skin care regimen Date Initiated: 02/09/2020 Target Resolution Date: 07/05/2020 Goal Status: Active Ulcer/skin breakdown will have a volume reduction of 30% by week 4 Date Initiated: 02/09/2020 Date Inactivated: 03/02/2020 Target Resolution Date:  03/08/2020 Goal Status: Met Interventions: Assess patient/caregiver ability to obtain necessary supplies Assess patient/caregiver ability to perform ulcer/skin care regimen upon admission and as needed Assess ulceration(s) every visit Provide education on ulcer and skin care Treatment Activities: Skin care regimen initiated : 02/09/2020 Topical wound management initiated : 02/09/2020 Notes: Electronic Signature(s) Signed: 06/14/2020 4:58:27 PM By: [name] Gouty RN, [name] Entered By: [name] on 06/14/2020 14:03:29 -------------------------------------------------------------------------------- Pain Assessment Details Patient Name: Date of Service: [name] [name], [place] [name] K. 06/14/2020 1:00 PM Medical Record Number: [id number] Patient Account Number: [id number] Date of Birth/Sex: Treating RN: 03/22/1951 (69 y.o. [name] Primary Care Magenta Schmiesing: [name] Other Clinician: Referring Natalio Salois: Treating Amatullah Christy/Extender: [name] in Treatment: 18 Active Problems Location of Pain Severity and Description of Pain Patient Has Paino No Site Locations Pain Management and Medication Current Pain Management: Electronic Signature(s) Signed: 06/20/2020 6:12:47 PM By: [name] Entered By: [name] on 06/14/2020 13:12:35 -------------------------------------------------------------------------------- Patient/Caregiver Education Details Patient Name: Date of Service: [place] [name] [name], [place] [name] K. 11/24/2021andnbsp1:00 PM Medical Record Number: [id number] Patient Account Number: [id number] Date of Birth/Gender: Treating RN: 1951-05-23 (68 y.o. [name] Primary Care Physician: [name] Other Clinician: Referring Physician: Treating Physician/Extender: [name] in Treatment: 18 Education Assessment Education Provided To: Patient Education Topics Provided Venous: Methods:  Explain/Verbal Responses: Reinforcements needed, State content correctly Wound/Skin Impairment: Methods: Explain/Verbal Responses: Reinforcements needed, State content correctly Electronic Signature(s) Signed: 06/14/2020 4:58:27 PM By: [name] Gouty RN, [name] Entered By: [name] on 06/14/2020 14:03:57 -------------------------------------------------------------------------------- Wound Assessment Details Patient Name: Date of Service: [name] [name], [place] [name] K. 06/14/2020 1:00 PM Medical Record Number: [id number] Patient Account Number: [id number] Date of Birth/Sex: Treating RN: 09/19/1950 (69 y.o. [name] Primary Care Bobbie Virden: [name] Other Clinician: Referring Elea Holtzclaw: Treating Juvenal Umar/Extender: [name] in Treatment: 18 Wound Status Wound Number: 17 Primary Diabetic Wound/Ulcer of the Lower Extremity Etiology: Wound Location: Right, Medial Malleolus Wound Open Wounding Event: Gradually Appeared Status: Date Acquired: 06/07/2020 Comorbid Cataracts, Anemia, Asthma, Hypertension, Type II Diabetes, End Weeks Of Treatment: 1 History: Stage Renal Disease, Osteomyelitis, Neuropathy Clustered Wound: No Photos Photo Uploaded By: Mikeal Hawthorne on 06/20/2020 14:28:59 Wound Measurements Length: (cm) 0.7 Width: (cm) 0.5 Depth: (cm) 0.1 Area: (cm) 0.275 Volume: (cm) 0.027 % Reduction in Area: 46.9% % Reduction in Volume: 48.1% Epithelialization: Small (1-33%) Tunneling: No Undermining: No Wound Description Classification: Grade 2 Wound Margin: Flat and Intact Exudate Amount: Medium Exudate Type: Serosanguineous Exudate Color: red, brown Foul Odor After Cleansing: No Slough/Fibrino Yes Wound Bed Granulation Amount: Large (67-100%) Exposed Structure Granulation Quality: Pink Fascia Exposed: No Necrotic  Amount: Small (1-33%) Fat Layer (Subcutaneous Tissue) Exposed: Yes Necrotic Quality: Adherent Slough Tendon Exposed:  No Muscle Exposed: No Joint Exposed: No Bone Exposed: No Treatment Notes Wound #17 (Right, Medial Malleolus) 1. Cleanse With Wound Cleanser Soap and water 2. Periwound Care Moisturizing lotion 3. Primary Dressing Applied Collegen AG Hydrogel or K-Y Jelly 4. Secondary Dressing Dry Gauze 6. Support Layer Applied 3 layer compression wrap Notes stockinette. Electronic Signature(s) Signed: 06/20/2020 6:12:47 PM By: [name] Entered By: [name] on 06/14/2020 13:25:10 -------------------------------------------------------------------------------- Wound Assessment Details Patient Name: Date of Service: [name] [name], [place] [name] K. 06/14/2020 1:00 PM Medical Record Number: [id number] Patient Account Number: [id number] Date of Birth/Sex: Treating RN: 1950/10/24 (69 y.o. [name] Primary Care Cierra Rothgeb: [name] Other Clinician: Referring Virgilia Quigg: Treating Hina Gupta/Extender: [name] in Treatment: 18 Wound Status Wound Number: 18 Primary Diabetic Wound/Ulcer of the Lower Extremity Etiology: Wound Location: Right, Medial Lower Leg Wound Open Wounding Event: Gradually Appeared Status: Date Acquired: 06/07/2020 Comorbid Cataracts, Anemia, Asthma, Hypertension, Type II Diabetes, End Weeks Of Treatment: 1 History: Stage Renal Disease, Osteomyelitis, Neuropathy Clustered Wound: No Photos Photo Uploaded By: Mikeal Hawthorne on 06/20/2020 14:30:19 Wound Measurements Length: (cm) Width: (cm) Depth: (cm) Area: (cm) Volume: (cm) 0 % Reduction in Area: 100% 0 % Reduction in Volume: 100% 0 Epithelialization: Large (67-100%) 0 Tunneling: No 0 Undermining: No Wound Description Classification: Grade 2 Wound Margin: Flat and Intact Exudate Amount: None Present Foul Odor After Cleansing: No Slough/Fibrino No Wound Bed Granulation Amount: None Present (0%) Exposed Structure Necrotic Amount: None Present (0%) Fascia Exposed:  No Fat Layer (Subcutaneous Tissue) Exposed: No Tendon Exposed: No Muscle Exposed: No Joint Exposed: No Bone Exposed: No Electronic Signature(s) Signed: 06/20/2020 6:12:47 PM By: [name] Entered By: [name] on 06/14/2020 13:25:27 -------------------------------------------------------------------------------- Wound Assessment Details Patient Name: Date of Service: [name] [name], [place] [name] K. 06/14/2020 1:00 PM Medical Record Number: [id number] Patient Account Number: [id number] Date of Birth/Sex: Treating RN: 1951-06-21 (69 y.o. [name] Primary Care Darnita Woodrum: [name] Other Clinician: Referring Shavar Gorka: Treating Rage Beever/Extender: [name] in Treatment: 18 Wound Status Wound Number: 8 Primary Diabetic Wound/Ulcer of the Lower Extremity Etiology: Wound Location: Left T Third oe Wound Open Wounding Event: Blister Status: Date Acquired: 02/23/2020 Comorbid Cataracts, Anemia, Asthma, Hypertension, Type II Diabetes, End Weeks Of Treatment: 16 History: Stage Renal Disease, Osteomyelitis, Neuropathy Clustered Wound: No Photos Photo Uploaded By: Mikeal Hawthorne on 06/20/2020 14:28:59 Wound Measurements Length: (cm) 0.4 Width: (cm) 0.4 Depth: (cm) 0.2 Area: (cm) 0.126 Volume: (cm) 0.025 % Reduction in Area: 10.6% % Reduction in Volume: -78.6% Epithelialization: Medium (34-66%) Tunneling: No Undermining: No Wound Description Classification: Grade 2 Wound Margin: Flat and Intact Exudate Amount: Medium Exudate Type: Serosanguineous Exudate Color: red, brown Foul Odor After Cleansing: No Slough/Fibrino Yes Wound Bed Granulation Amount: Medium (34-66%) Exposed Structure Granulation Quality: Pink Fascia Exposed: No Necrotic Amount: Medium (34-66%) Fat Layer (Subcutaneous Tissue) Exposed: Yes Necrotic Quality: Adherent Slough Tendon Exposed: No Muscle Exposed: No Joint Exposed: No Bone Exposed: No Treatment  Notes Wound #8 (Left Toe Third) 1. Cleanse With Wound Cleanser 3. Primary Dressing Applied Collegen AG Hydrogel or K-Y Jelly 4. Secondary Dressing Dry Gauze Roll Gauze 5. Secured With Medipore tape Notes normal saline to moisten collagen . netting. Electronic Signature(s) Signed: 06/20/2020 6:12:47 PM By: [name] Entered By: [name] on 06/14/2020 13:25:47 -------------------------------------------------------------------------------- Vitals Details Patient Name: Date of Service: [name]  [name], [place] [name] K. 06/14/2020 1:00 PM Medical Record Number: [id number] Patient Account Number: [id number] Date of Birth/Sex: Treating RN: [birth year] (69 y.o. [name] Primary Care Faryn Sieg: [name] Other Clinician: Referring Brian Kocourek: Treating Staley Budzinski/Extender: [name] in Treatment: 18 Vital Signs Time Taken: 13:15 Temperature (F): 98.2 Height (in): 67 Pulse (bpm): 103 Weight (lbs): 202 Respiratory Rate (breaths/min): 16 Body Mass Index (BMI): 31.6 Blood Pressure (mmHg): 93/58 Reference Range: 80 - 120 mg / dl Electronic Signature(s) Signed: 06/20/2020 6:12:47 PM By: [name] Entered By: [name] on 06/14/2020 13:15:34

## 2020-06-21 ENCOUNTER — Other Ambulatory Visit: Payer: Self-pay | Admitting: Family Medicine

## 2020-06-21 ENCOUNTER — Encounter (HOSPITAL_BASED_OUTPATIENT_CLINIC_OR_DEPARTMENT_OTHER): Payer: Medicare Other | Attending: Physician Assistant | Admitting: Physician Assistant

## 2020-06-21 ENCOUNTER — Other Ambulatory Visit: Payer: Self-pay

## 2020-06-21 DIAGNOSIS — E1122 Type 2 diabetes mellitus with diabetic chronic kidney disease: Secondary | ICD-10-CM | POA: Diagnosis not present

## 2020-06-21 DIAGNOSIS — L97522 Non-pressure chronic ulcer of other part of left foot with fat layer exposed: Secondary | ICD-10-CM | POA: Insufficient documentation

## 2020-06-21 DIAGNOSIS — E11621 Type 2 diabetes mellitus with foot ulcer: Secondary | ICD-10-CM | POA: Diagnosis not present

## 2020-06-21 DIAGNOSIS — I872 Venous insufficiency (chronic) (peripheral): Secondary | ICD-10-CM | POA: Diagnosis not present

## 2020-06-21 DIAGNOSIS — E1140 Type 2 diabetes mellitus with diabetic neuropathy, unspecified: Secondary | ICD-10-CM | POA: Insufficient documentation

## 2020-06-21 DIAGNOSIS — I120 Hypertensive chronic kidney disease with stage 5 chronic kidney disease or end stage renal disease: Secondary | ICD-10-CM | POA: Diagnosis not present

## 2020-06-21 DIAGNOSIS — Z992 Dependence on renal dialysis: Secondary | ICD-10-CM | POA: Insufficient documentation

## 2020-06-21 DIAGNOSIS — N186 End stage renal disease: Secondary | ICD-10-CM | POA: Diagnosis not present

## 2020-06-21 DIAGNOSIS — L97312 Non-pressure chronic ulcer of right ankle with fat layer exposed: Secondary | ICD-10-CM | POA: Diagnosis not present

## 2020-06-21 DIAGNOSIS — E11622 Type 2 diabetes mellitus with other skin ulcer: Secondary | ICD-10-CM | POA: Diagnosis not present

## 2020-06-21 NOTE — Progress Notes (Signed)
[name], [name] ([id number]) Visit Report for 06/21/2020 Arrival Information Details Patient Name: Date of Service: [name] [name], [name] 06/21/2020 3:15 PM Medical Record Number: [id number] Patient Account Number: [id number] Date of Birth/Sex: Treating RN: [birth year] (69 y.o. [name] Primary Care Janise Gora: [name] Other Clinician: Referring Aqib Lough: Treating Dickey Caamano/Extender: Christella Hartigan in Treatment: 59 Visit Information History Since Last Visit Added or deleted any medications: No Patient Arrived: Wheel Chair Any new allergies or adverse reactions: No Arrival Time: 15:45 Had a fall or experienced change in No Accompanied By: self activities of daily living that may affect Transfer Assistance: None risk of falls: Patient Identification Verified: Yes Signs or symptoms of abuse/neglect since last visito No Secondary Verification Process Completed: Yes Hospitalized since last visit: No Patient Requires Transmission-Based Precautions: No Implantable device outside of the clinic excluding No Patient Has Alerts: Yes cellular tissue based products placed in the [name] Patient Alerts: Right ABI: [place] since last visit: Left ABI: [place] Pain Present Now: No Electronic Signature(s) Signed: 06/21/2020 4:20:36 PM By: Mikeal Hawthorne EMT/HBOT/SD Entered By: Mikeal Hawthorne on 06/21/2020 15:51:53 -------------------------------------------------------------------------------- Compression Therapy Details Patient Name: Date of Service: [name], [place] [name] 06/21/2020 3:15 PM Medical Record Number: [id number] Patient Account Number: [id number] Date of Birth/Sex: Treating RN: 11/03/1950 (69 y.o. [name] Primary Care Happy Begeman: [name] Other Clinician: Referring Rainelle Sulewski: Treating Jasiya Markie/Extender: Christella Hartigan in Treatment: 19 Compression Therapy Performed for Wound Assessment: Wound #17 Right,Medial  Malleolus Performed By: [name], RN Compression Type: Three Layer Post Procedure Diagnosis Same as Pre-procedure Electronic Signature(s) Signed: 06/21/2020 5:21:24 PM By: [name] RN, BSN Entered By: [name] on 06/21/2020 16:10:20 -------------------------------------------------------------------------------- Lower Extremity Assessment Details Patient Name: Date of Service: [name] [name], [place] [name] 06/21/2020 3:15 PM Medical Record Number: [id number] Patient Account Number: [id number] Date of Birth/Sex: Treating RN: [birth year] (69 y.o. [name] Primary Care [name]: [name] Other Clinician: Referring [name]: Treating Corvin Sorbo/Extender: Christella Hartigan in Treatment: 19 Edema Assessment Assessed: [Left: No] [Right: No] Edema: [Left: No] [Right: No] Calf Left: Right: Point of Measurement: 38 cm From Medial Instep 35.7 cm 32 cm Ankle Left: Right: Point of Measurement: 13 cm From Medial Instep 24.5 cm 23.7 cm Vascular Assessment Pulses: Dorsalis Pedis Palpable: [Left:Yes] [Right:Yes] Electronic Signature(s) Signed: 06/21/2020 4:20:36 PM By: Mikeal Hawthorne EMT/HBOT/SD Signed: 06/21/2020 5:21:24 PM By: [name] RN, BSN Entered By: Mikeal Hawthorne on 06/21/2020 15:54:27 -------------------------------------------------------------------------------- Multi-Disciplinary Care Plan Details Patient Name: Date of Service: [name] [name], [name] [name] 06/21/2020 3:15 PM Medical Record Number: [id number] Patient Account Number: [id number] Date of Birth/Sex: Treating RN: 12-19-1950 (69 y.o. [name] Primary Care [name]: [name] Other Clinician: Referring [name]: Treating [name]/Extender: Christella Hartigan in Treatment: 19 Active Inactive Venous Leg Ulcer Nursing Diagnoses: Knowledge deficit related to disease process and management Potential for venous Insuffiency (use before  diagnosis confirmed) Goals: Patient will maintain optimal edema control Date Initiated: 02/09/2020 Target Resolution Date: 07/05/2020 Goal Status: Active Patient/caregiver will verbalize understanding of disease process and disease management Date Initiated: 02/09/2020 Date Inactivated: 03/02/2020 Target Resolution Date: 03/08/2020 Goal Status: Met Interventions: Assess peripheral edema status every visit. Compression as ordered Provide education on venous insufficiency Treatment Activities: Therapeutic compression applied : 02/09/2020 Notes: Wound/Skin Impairment Nursing Diagnoses: Impaired tissue integrity Knowledge deficit related to ulceration/compromised skin integrity Goals: Patient/caregiver will verbalize understanding of skin care regimen Date Initiated: 02/09/2020 Target Resolution Date:  07/05/2020 Goal Status: Active Ulcer/skin breakdown will have a volume reduction of 30% by week 4 Date Initiated: 02/09/2020 Date Inactivated: 03/02/2020 Target Resolution Date: 03/08/2020 Goal Status: Met Interventions: Assess patient/caregiver ability to obtain necessary supplies Assess patient/caregiver ability to perform ulcer/skin care regimen upon admission and as needed Assess ulceration(s) every visit Provide education on ulcer and skin care Treatment Activities: Skin care regimen initiated : 02/09/2020 Topical wound management initiated : 02/09/2020 Notes: Electronic Signature(s) Signed: 06/21/2020 5:21:24 PM By: [name] RN, BSN Entered By: [name] on 06/21/2020 16:09:26 -------------------------------------------------------------------------------- Pain Assessment Details Patient Name: Date of Service: [name], [place] [name] 06/21/2020 3:15 PM Medical Record Number: [id number] Patient Account Number: [id number] Date of Birth/Sex: Treating RN: 05-27-1951 (69 y.o. [name] Primary Care [name]: [name] Other Clinician: Referring  Aubriee Szeto: Treating Henrik Orihuela/Extender: Christella Hartigan in Treatment: 19 Active Problems Location of Pain Severity and Description of Pain Patient Has Paino No Site Locations With Dressing Change: No Pain Management and Medication Current Pain Management: Electronic Signature(s) Signed: 06/21/2020 4:20:36 PM By: Mikeal Hawthorne EMT/HBOT/SD Signed: 06/21/2020 5:21:24 PM By: [name] RN, BSN Entered By: Mikeal Hawthorne on 06/21/2020 15:52:43 -------------------------------------------------------------------------------- Patient/Caregiver Education Details Patient Name: Date of Service: [facility] [name], [place] [name] 12/1/2021andnbsp3:15 PM Medical Record Number: [id number] Patient Account Number: [id number] Date of Birth/Gender: Treating RN: 1951-06-30 (69 y.o. [name] Primary Care Physician: [name] Other Clinician: Referring Physician: Treating Physician/Extender: Christella Hartigan in Treatment: 19 Education Assessment Education Provided To: Patient Education Topics Provided Venous: Methods: Explain/Verbal Responses: Reinforcements needed, State content correctly Wound/Skin Impairment: Methods: Explain/Verbal Responses: Reinforcements needed, State content correctly Electronic Signature(s) Signed: 06/21/2020 5:21:24 PM By: [name] RN, BSN Entered By: [name] on 06/21/2020 16:09:49 -------------------------------------------------------------------------------- Wound Assessment Details Patient Name: Date of Service: [name], [place] [name] 06/21/2020 3:15 PM Medical Record Number: [id number] Patient Account Number: [id number] Date of Birth/Sex: Treating RN: January 08, 1951 (69 y.o. [name] Primary Care Giani Winther: [name] Other Clinician: Referring Isadora Delorey: Treating Elta Angell/Extender: Christella Hartigan in Treatment: 19 Wound Status Wound Number: 17 Primary Diabetic  Wound/Ulcer of the Lower Extremity Etiology: Wound Location: Right, Medial Malleolus Wound Open Wounding Event: Gradually Appeared Status: Date Acquired: 06/07/2020 Comorbid Cataracts, Anemia, Asthma, Hypertension, Type II Diabetes, End Weeks Of Treatment: 2 History: Stage Renal Disease, Osteomyelitis, Neuropathy Clustered Wound: No Wound Measurements Length: (cm) 0.5 Width: (cm) 0.6 Depth: (cm) 0.1 Area: (cm) 0.236 Volume: (cm) 0.024 % Reduction in Area: 54.4% % Reduction in Volume: 53.8% Epithelialization: Small (1-33%) Tunneling: No Undermining: No Wound Description Classification: Grade 2 Wound Margin: Flat and Intact Exudate Amount: None Present Foul Odor After Cleansing: No Slough/Fibrino No Wound Bed Granulation Amount: Large (67-100%) Exposed Structure Granulation Quality: Pink Fascia Exposed: No Necrotic Amount: Small (1-33%) Fat Layer (Subcutaneous Tissue) Exposed: No Tendon Exposed: No Muscle Exposed: No Joint Exposed: No Bone Exposed: No Electronic Signature(s) Signed: 06/21/2020 4:20:36 PM By: Mikeal Hawthorne EMT/HBOT/SD Signed: 06/21/2020 5:21:24 PM By: [name] RN, BSN Entered By: Mikeal Hawthorne on 06/21/2020 15:57:45 -------------------------------------------------------------------------------- Wound Assessment Details Patient Name: Date of Service: [name], [name] [name] 06/21/2020 3:15 PM Medical Record Number: [id number] Patient Account Number: [id number] Date of Birth/Sex: Treating RN: 11-03-1950 (69 y.o. [name] Primary Care Chonte Ricke: [name] Other Clinician: Referring Alioune Hodgkin: Treating Honora Searson/Extender: Christella Hartigan in Treatment: 19 Wound Status Wound Number: 8 Primary Diabetic Wound/Ulcer of the Lower Extremity Etiology: Wound Location:  Left T Third oe Wound Open Wounding Event: Blister Status: Date Acquired: 02/23/2020 Comorbid Cataracts, Anemia, Asthma, Hypertension, Type II  Diabetes, End Weeks Of Treatment: 17 History: Stage Renal Disease, Osteomyelitis, Neuropathy Clustered Wound: No Wound Measurements Length: (cm) 0.4 Width: (cm) 0.4 Depth: (cm) 0.1 Area: (cm) 0.126 Volume: (cm) 0.013 % Reduction in Area: 10.6% % Reduction in Volume: 7.1% Epithelialization: Medium (34-66%) Tunneling: No Undermining: No Wound Description Classification: Grade 2 Wound Margin: Flat and Intact Exudate Amount: None Present Foul Odor After Cleansing: No Slough/Fibrino No Wound Bed Granulation Amount: None Present (0%) Exposed Structure Necrotic Amount: Large (67-100%) Fascia Exposed: No Necrotic Quality: Eschar Fat Layer (Subcutaneous Tissue) Exposed: No Tendon Exposed: No Muscle Exposed: No Joint Exposed: No Bone Exposed: No Electronic Signature(s) Signed: 06/21/2020 4:20:36 PM By: Mikeal Hawthorne EMT/HBOT/SD Signed: 06/21/2020 5:21:24 PM By: [name] RN, BSN Signed: 06/21/2020 5:21:24 PM By: [name] RN, BSN Entered By: Mikeal Hawthorne on 06/21/2020 15:57:32 -------------------------------------------------------------------------------- [place] Details Patient Name: Date of Service: [name], [place] [name] 06/21/2020 3:15 PM Medical Record Number: [id number] Patient Account Number: [id number] Date of Birth/Sex: Treating RN: 1950-12-11 (69 y.o. [name] Primary Care Damaria Stofko: [name] Other Clinician: Referring Shaneya Taketa: Treating Gilberto Stanforth/Extender: Christella Hartigan in Treatment: 19 Vital Signs Time Taken: 15:50 Temperature (F): 99 Height (in): 67 Pulse (bpm): 76 Weight (lbs): 202 Respiratory Rate (breaths/min): 20 Body Mass Index (BMI): 31.6 Blood Pressure (mmHg): 104/67 Reference Range: 80 - 120 mg / dl Electronic Signature(s) Signed: 06/21/2020 4:20:36 PM By: Mikeal Hawthorne EMT/HBOT/SD Entered By: Mikeal Hawthorne on 06/21/2020 15:52:22

## 2020-06-22 DIAGNOSIS — D631 Anemia in chronic kidney disease: Secondary | ICD-10-CM | POA: Diagnosis not present

## 2020-06-22 DIAGNOSIS — Z992 Dependence on renal dialysis: Secondary | ICD-10-CM | POA: Diagnosis not present

## 2020-06-22 DIAGNOSIS — N186 End stage renal disease: Secondary | ICD-10-CM | POA: Diagnosis not present

## 2020-06-22 DIAGNOSIS — D509 Iron deficiency anemia, unspecified: Secondary | ICD-10-CM | POA: Diagnosis not present

## 2020-06-22 DIAGNOSIS — D689 Coagulation defect, unspecified: Secondary | ICD-10-CM | POA: Diagnosis not present

## 2020-06-22 DIAGNOSIS — N2581 Secondary hyperparathyroidism of renal origin: Secondary | ICD-10-CM | POA: Diagnosis not present

## 2020-06-22 NOTE — Progress Notes (Addendum)
TALISSA, APPLE ([id number]) Visit Report for 06/21/2020 Chief Complaint Document Details Patient Name: Date of Service: [name], [name] 06/21/2020 3:15 PM Medical Record Number: [id number] Patient Account Number: [id number] Date of Birth/Sex: Treating RN: 15-Oct-1950 (69 y.o. [name] Primary Care Provider: [name] Other Clinician: Referring Provider: Treating Provider/Extender: Christella Hartigan in Treatment: 19 Information Obtained from: Patient Chief Complaint Multiple LE Ulcers Electronic Signature(s) Signed: 06/21/2020 3:29:43 PM By: [name] PA-C Entered By: [name] on 06/21/2020 15:29:42 -------------------------------------------------------------------------------- Problem List Details Patient Name: Date of Service: [facility] [name], [place] [name] 06/21/2020 3:15 PM Medical Record Number: [id number] Patient Account Number: [id number] Date of Birth/Sex: Treating RN: Jun 07, 1951 (69 y.o. [name] Primary Care Provider: [name] Other Clinician: Referring Provider: Treating Provider/Extender: Christella Hartigan in Treatment: 19 Active Problems ICD-10 Encounter Code Description Active Date MDM Diagnosis I87.2 Venous insufficiency (chronic) (peripheral) 02/09/2020 No Yes E11.621 Type 2 diabetes mellitus with foot ulcer 02/09/2020 No Yes L97.522 Non-pressure chronic ulcer of other part of left foot with fat layer exposed 05/31/2020 No Yes N18.6 End stage renal disease 02/09/2020 No Yes Z99.2 Dependence on renal dialysis 02/09/2020 No Yes I10 Essential (primary) hypertension 02/09/2020 No Yes Inactive Problems Resolved Problems ICD-10 Code Description Active Date Resolved Date L97.822 Non-pressure chronic ulcer of other part of left lower leg with fat layer exposed 02/09/2020 02/09/2020 L97.312 Non-pressure chronic ulcer of right ankle with fat layer exposed 02/09/2020 02/09/2020 L97.812 Non-pressure chronic  ulcer of other part of right lower leg with fat layer exposed 02/09/2020 02/09/2020 L97.512 Non-pressure chronic ulcer of other part of right foot with fat layer exposed 02/09/2020 02/09/2020 Electronic Signature(s) Signed: 06/21/2020 3:26:04 PM By: [name] PA-C Entered By: [name] on 06/21/2020 15:26:04

## 2020-06-24 DIAGNOSIS — N186 End stage renal disease: Secondary | ICD-10-CM | POA: Diagnosis not present

## 2020-06-24 DIAGNOSIS — N2581 Secondary hyperparathyroidism of renal origin: Secondary | ICD-10-CM | POA: Diagnosis not present

## 2020-06-24 DIAGNOSIS — D689 Coagulation defect, unspecified: Secondary | ICD-10-CM | POA: Diagnosis not present

## 2020-06-24 DIAGNOSIS — D631 Anemia in chronic kidney disease: Secondary | ICD-10-CM | POA: Diagnosis not present

## 2020-06-24 DIAGNOSIS — Z992 Dependence on renal dialysis: Secondary | ICD-10-CM | POA: Diagnosis not present

## 2020-06-24 DIAGNOSIS — D509 Iron deficiency anemia, unspecified: Secondary | ICD-10-CM | POA: Diagnosis not present

## 2020-06-27 DIAGNOSIS — D509 Iron deficiency anemia, unspecified: Secondary | ICD-10-CM | POA: Diagnosis not present

## 2020-06-27 DIAGNOSIS — Z992 Dependence on renal dialysis: Secondary | ICD-10-CM | POA: Diagnosis not present

## 2020-06-27 DIAGNOSIS — N186 End stage renal disease: Secondary | ICD-10-CM | POA: Diagnosis not present

## 2020-06-27 DIAGNOSIS — D689 Coagulation defect, unspecified: Secondary | ICD-10-CM | POA: Diagnosis not present

## 2020-06-27 DIAGNOSIS — D631 Anemia in chronic kidney disease: Secondary | ICD-10-CM | POA: Diagnosis not present

## 2020-06-27 DIAGNOSIS — N2581 Secondary hyperparathyroidism of renal origin: Secondary | ICD-10-CM | POA: Diagnosis not present

## 2020-06-28 ENCOUNTER — Other Ambulatory Visit: Payer: Self-pay

## 2020-06-28 ENCOUNTER — Encounter (HOSPITAL_BASED_OUTPATIENT_CLINIC_OR_DEPARTMENT_OTHER): Payer: Medicare Other | Admitting: Physician Assistant

## 2020-06-28 DIAGNOSIS — I872 Venous insufficiency (chronic) (peripheral): Secondary | ICD-10-CM | POA: Diagnosis not present

## 2020-06-28 DIAGNOSIS — E1122 Type 2 diabetes mellitus with diabetic chronic kidney disease: Secondary | ICD-10-CM | POA: Diagnosis not present

## 2020-06-28 DIAGNOSIS — N3289 Other specified disorders of bladder: Secondary | ICD-10-CM | POA: Diagnosis not present

## 2020-06-28 DIAGNOSIS — Z85528 Personal history of other malignant neoplasm of kidney: Secondary | ICD-10-CM | POA: Diagnosis not present

## 2020-06-28 DIAGNOSIS — L97512 Non-pressure chronic ulcer of other part of right foot with fat layer exposed: Secondary | ICD-10-CM | POA: Diagnosis not present

## 2020-06-28 DIAGNOSIS — R918 Other nonspecific abnormal finding of lung field: Secondary | ICD-10-CM | POA: Diagnosis not present

## 2020-06-28 DIAGNOSIS — E1140 Type 2 diabetes mellitus with diabetic neuropathy, unspecified: Secondary | ICD-10-CM | POA: Diagnosis not present

## 2020-06-28 DIAGNOSIS — N186 End stage renal disease: Secondary | ICD-10-CM | POA: Diagnosis not present

## 2020-06-28 DIAGNOSIS — L97522 Non-pressure chronic ulcer of other part of left foot with fat layer exposed: Secondary | ICD-10-CM | POA: Diagnosis not present

## 2020-06-28 DIAGNOSIS — I120 Hypertensive chronic kidney disease with stage 5 chronic kidney disease or end stage renal disease: Secondary | ICD-10-CM | POA: Diagnosis not present

## 2020-06-28 DIAGNOSIS — Z992 Dependence on renal dialysis: Secondary | ICD-10-CM | POA: Diagnosis not present

## 2020-06-28 DIAGNOSIS — E11621 Type 2 diabetes mellitus with foot ulcer: Secondary | ICD-10-CM | POA: Diagnosis not present

## 2020-06-28 NOTE — Progress Notes (Addendum)
[name], [name] ([id number]) Visit Report for 06/28/2020 Chief Complaint Document Details Patient Name: Date of Service: [name] [name], [name] 06/28/2020 2:45 PM Medical Record Number: [id number] Patient Account Number: [id number] Date of Birth/Sex: Treating RN: 1951-02-18 (69 y.o. [name] Primary Care Provider: Jenna [name] Other Clinician: Referring Provider: Treating Provider/Extender: [name] in Treatment: 20 Information Obtained from: Patient Chief Complaint Multiple LE Ulcers Electronic Signature(s) Signed: 06/28/2020 2:56:25 PM By: [name] PA-C Entered By: [name] on 06/28/2020 14:56:24 -------------------------------------------------------------------------------- Debridement Details Patient Name: Date of Service: [name], [name] 06/28/2020 2:45 PM Medical Record Number: [id number] Patient Account Number: [id number] Date of Birth/Sex: Treating RN: June 13, 1951 (69 y.o. [name] Primary Care Provider: Jenna [name] Other Clinician: Referring Provider: Treating Provider/Extender: [name] in Treatment: 20 Debridement Performed for Assessment: Wound #19 Right T Third oe Performed By: [name], RN Debridement Type: Chemical/Enzymatic/Mechanical Agent Used: Santyl Severity of Tissue Pre Debridement: Fat layer exposed Level of Consciousness (Pre-procedure): Awake and Alert Pre-procedure Verification/Time Out Yes - 16:15 Taken: Bleeding: None Response to Treatment: Procedure was tolerated well Level of Consciousness (Post- Awake and Alert procedure): Post Debridement Measurements of Total Wound Length: (cm) 0.7 Width: (cm) 0.5 Depth: (cm) 0.1 Volume: (cm) 0.027 Character of Wound/Ulcer Post Debridement: Requires Further Debridement Severity of Tissue Post Debridement: Fat layer exposed Post Procedure Diagnosis Same as Pre-procedure Electronic  Signature(s) Signed: 06/28/2020 6:12:31 PM By: [name] RN, BSN Signed: 06/29/2020 1:30:16 PM By: [name] PA-C Entered By: [name] on 06/28/2020 17:25:09 -------------------------------------------------------------------------------- Debridement Details Patient Name: Date of Service: [name] [name], [place] [name] 06/28/2020 2:45 PM Medical Record Number: [id number] Patient Account Number: [id number] Date of Birth/Sex: Treating RN: June 17, 1951 (69 y.o. [name] Primary Care Provider: Jenna [name] Other Clinician: Referring Provider: Treating Provider/Extender: [name] in Treatment: 20 Debridement Performed for Assessment: Wound #8 Left T Third oe Performed By: [name], RN Debridement Type: Chemical/Enzymatic/Mechanical Agent Used: Santyl Severity of Tissue Pre Debridement: Fat layer exposed Level of Consciousness (Pre-procedure): Awake and Alert Pre-procedure Verification/Time Out Yes - 16:15 Taken: Bleeding: None Response to Treatment: Procedure was tolerated well Level of Consciousness (Post- Awake and Alert procedure): Post Debridement Measurements of Total Wound Length: (cm) 0.9 Width: (cm) 0.8 Depth: (cm) 0.2 Volume: (cm) 0.113 Character of Wound/Ulcer Post Debridement: Requires Further Debridement Severity of Tissue Post Debridement: Fat layer exposed Post Procedure Diagnosis Same as Pre-procedure Electronic Signature(s) Signed: 06/28/2020 6:12:31 PM By: [name] RN, BSN Signed: 06/29/2020 1:30:16 PM By: [name] PA-C Entered By: [name] on 06/28/2020 17:25:45 -------------------------------------------------------------------------------- HPI Details Patient Name: Date of Service: [name], [place] [name] 06/28/2020 2:45 PM Medical Record Number: [id number] Patient Account Number: [id number] Date of Birth/Sex: Treating RN: Aug 13, 1950 (69 y.o. [name] Primary Care Provider:  Jenna [name] Other Clinician: Referring Provider: Treating Provider/Extender: [name] in Treatment: 20 History of Present Illness HPI Description: Evaluate7/21/2021 today patient presents for evaluation of ulcerations on her legs which she tells me tend to come and go as far as small blistering locations and sometimes are better than other times. Right now she tells me that this is actually doing a little bit better but nonetheless will not completely go away. She has ulcerations bilaterally on her lower extremities. The right is worse than the left. 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 The patient has no evidence of active infection at this time. She however appears to have proficient blood flow into the extremities and I think would tolerate a 3 layer compression wrap she was noncompressible on arterial studies. Nonetheless there was no obvious signs of occlusion. 02/16/2020 on evaluation today patient appears to be doing much better in regard to her wounds in general. On the past week she has made significant improvements which is great news there is no signs of active infection at this time. No fevers, chills, nausea, vomiting, or diarrhea. 02/23/2020 on evaluation today patient appears to be doing well in regard to her lower extremity ulcers and ankle ulcers. Overall I feel like she is making great progress and in general I am extremely pleased with how things stand. There is no sign of active infection at this time which is also good news. She will require slight debridement around the right ankle region. 03/02/20-Patient returns at 1 week, has few new areas open on the left leg, especially 1 healed area on the medial ankle that has slight small open area now, to anterior leg areas that started out as blisters that are open now. She was using  juxta lite compression to the left, she used Band-Aid to cover the vesicle that had ruptured on the left anterior leg, we are using 3 layer compression on the right. We are using silver alginate to the toe wound on the left. 03/08/2020 upon evaluation today patient unfortunately is doing much worse in regard to her right leg although her left leg looks like is doing better. Again we have taken her compression wraps off last week since she was doing well and transitioned her to her Velcro compression wrap. However apparently the patient is stating this was not put on here in the clinic and she never put it on at home. With that being said unfortunately she developed swelling and opened up new wounds which are present today she has quite a few of them in fact. 03/15/2020 on evaluation today patient appears to be doing well with regard to her wounds on the lower extremities bilaterally. Fortunately there is no signs of active infection at this time. No fever chills noted. Overall I am actually very pleased with where things stand currently. 03/22/2020 on evaluation today patient appears to be doing fairly well in regard to her wounds in general. She just has really one area left on her toe everything else is healed on her legs. She does have her Velcro compression wraps which I think are appropriate at this point. Fortunately there is no signs of active infection at this time. No fevers, chills, nausea, vomiting, or diarrhea. 03/29/2020 on evaluation today patient appears to be doing poorly in regard to her right leg which is pretty much completely reopened compared to last week. She states it was okay until Saturday when she woke up she had blisters. With that being said it appears that this is likely stemming from the fact that the patient is apparently though I just found this out today going to sleep in her bed with her legs up on the bed with her but throughout the night she tends to end up with her legs  hanging off the side or bottom of the bed on the floor. With that being said I think that this likely is happening much too long and then she has significant swelling the builds up  as she sleeps. Obviously I think this is the reasoning behind what we are seeing currently. 04/05/2020 on evaluation today patient appears to be doing quite well with regard to her leg ulcers at this point. Fortunately there is no signs of anything significantly open again which is great news nonetheless I am to continue wrapping her for at least a couple of weeks due to the fact that she seems to continually reopen as soon as we unwrapped her. 04/12/2020 upon evaluation today patient appears to be doing excellent in regard to her leg ulcers all appear to be improving which is great news. She is also doing better in regard to the toe the infection seems to be under better control and the Santyl is helping to clean this up. Overall very pleased much more so than what I saw last week. 04/19/2020 on evaluation today patient appears to be doing well with regard to her leg ulcers which are showing no signs of opening at this point. Her toe is also showing signs of improvement she is about done with her antibiotic. Overall I feel like things are doing quite well. 04/26/2020 upon evaluation today patient appears to be doing unfortunately a little bit worse compared to previous. She has reopened wounds on the right anterior lower extremity. This is unfortunate as we were hoping that this would really maintain healing. That does not appear to be the case. I think with him the need to see about making a referral for venous studies and provider evaluation with [facility] imaging with the interventional radiologist. 08/11/2019 upon evaluation today patient appears to be doing well in general in regard to her legs. Her right leg she does have a new wound that opened since last week. On the left leg everything appears to be healed she does  still have the opening on her toe which we are managing but again this does not appear to be any worse. She just needs to get this to feeling and cover over. There is no evidence of infection which is at least great news. 05/17/2020 upon evaluation today patient appears to be doing decently well in regard to her leg ulcers this is good news. She does have still an opening on the left second toe. She has been let this dry out that not really what we want to see currently. For that reason I discussed with her again the fact that she needs to keep this covered and not allow it to dry out in order to allow for appropriate healing. She voiced understanding and states she did not know that. 05/24/2020 upon evaluation today patient's legs both appear to be still be completely healed which is great news. We are now going to transition into having her utilize her juxta lite compression wraps on the bilateral lower extremities. She is definitely ready for this. Her toe also seems to be doing better. 05/31/2020 on evaluation today patient appears to be doing well with regard to her legs. There is nothing open at this point. Her toe still has some slough noted and get a clean that off today by way of sharp debridement other than that I feel like the collagen is probably still her best bet in that regard. 06/07/2020 upon evaluation today patient appears to be doing well with regard to her left leg and her toe ulcer the toe seems to be doing better the leg is doing okay. The right leg unfortunately she has new wounds I think her shoes are rubbing on  the side of her ankle which is what is causing part of this issue. Fortunately there is no signs of active infection at this time which is great news. 06/14/2020 upon evaluation today patient appears to be doing well today in regard to most of her wounds. Everything seems to be showing some signs of improvement or else healing. Fortunately there is no signs of active  infection at this time. No fevers, chills, nausea, vomiting, or diarrhea. She has been using a silver collagen for her toe ulcer. Damage12/07/2019 on evaluation today patient appears to be doing okay in regard to her right medial ankle ulcer in her left toe ulcer. Both are a little bit better today visually but again she still is not doing as well as I like to see. She still continues to wear the worn-out 69 year old crocs which are not good for her to be honest I think it is causing and preventing healing. Fortunately there is no signs of active infection at this time. No fevers, chills, nausea, vomiting, or diarrhea. 06/28/20 upon evaluation today patient appears to be doing poorly in regard to her second toes bilaterally. These both appear to be rubbing on her shoes these are shoes that have asked him to get rid of every visit since have been seeing her. I had a much more lengthy conversation with her about it last week she told me she was going to get different shoes to wear I get she comes in with the same shoes on today. Nonetheless I think this is part of her problem. Obviously the left second toe appears to be infected at this point. The right necessarily see evidence of this but nonetheless is definitely been rubbing where it sticks up. Electronic Signature(s) Signed: 06/28/2020 4:19:41 PM By: [name] PA-C Entered By: [name] on 06/28/2020 16:19:41 -------------------------------------------------------------------------------- Physical Exam Details Patient Name: Date of Service: [facility] [name], [place] [name] 06/28/2020 2:45 PM Medical Record Number: [id number] Patient Account Number: [id number] Date of Birth/Sex: Treating RN: 1950/12/30 (69 y.o. [name] Primary Care Provider: Jenna [name] Other Clinician: Referring Provider: Treating Provider/Extender: [name] in Treatment: 10 Constitutional Well-nourished and well-hydrated in no acute  distress. Respiratory normal breathing without difficulty. Psychiatric this patient is able to make decisions and demonstrates good insight into disease process. Alert and Oriented x 3. pleasant and cooperative. Notes Upon inspection patient's wound bed actually showed signs of good epithelization in regard to the ankle this is great news that appears to be healed I think she does not need to be wrapped any longer. With that being said she does need to have dressings on her toes in order to pad them and protect them from rubbing she also needs to not be wearing these crocs which I told her several times each visit over the past several weeks that have seen her. Electronic Signature(s) Signed: 06/28/2020 4:20:08 PM By: [name] PA-C Entered By: [name] on 06/28/2020 16:20:08 -------------------------------------------------------------------------------- Physician Orders Details Patient Name: Date of Service: [facility] [name], [place] [name] 06/28/2020 2:45 PM Medical Record Number: [id number] Patient Account Number: [id number] Date of Birth/Sex: Treating RN: [birth year] (69 y.o. [name] Primary Care Provider: Jenna [name] Other Clinician: Referring Provider: Treating Provider/Extender: [name] in Treatment: 79 Verbal / Phone Orders: No Diagnosis Coding ICD-10 Coding Code Description I87.2 Venous insufficiency (chronic) (peripheral) E11.621 Type 2 diabetes mellitus with foot ulcer L97.522 Non-pressure chronic ulcer of other part  of left foot with fat layer exposed N18.6 End stage renal disease Z99.2 Dependence on renal dialysis I10 Essential (primary) hypertension Follow-up Appointments Return Appointment in 1 week. Bathing/ Shower/ Hygiene May shower and wash wound with soap and water. Edema Control - Lymphedema / SCD / Other Bilateral Lower Extremities Elevate legs to the level of the heart or above for 30 minutes daily and/or when  sitting, a frequency of: Avoid standing for long periods of time. Exercise regularly Moisturize legs daily. Compression stocking or Garment 20-30 mm/Hg pressure to: - juxtalites to both legs daily, apply in the morning and removed at night Wound Treatment Wound #19 - T Third oe Wound Laterality: Right Prim Dressing: Santyl Ointment 1 x Per Day ary Discharge Instructions: Apply nickel thick amount to wound bed as instructed Secondary Dressing: Woven Gauze Sponges 2x2 in 1 x Per Day Discharge Instructions: Apply over primary dressing as directed. Secured With: Child psychotherapist, Sterile 2x75 (in/in) 1 x Per Day Discharge Instructions: Secure with stretch gauze as directed. Wound #8 - T Third oe Wound Laterality: Left Prim Dressing: Santyl Ointment 1 x Per Day ary Discharge Instructions: Apply nickel thick amount to wound bed as instructed Secondary Dressing: Woven Gauze Sponges 2x2 in 1 x Per Day Discharge Instructions: Apply over primary dressing as directed. Secured With: Child psychotherapist, Sterile 2x75 (in/in) 1 x Per Day Discharge Instructions: Secure with stretch gauze as directed. Laboratory naerobe culture (MICRO) - left 3rd toe Bacteria identified in Unspecified specimen by A LOINC Code: 500-9 Convenience Name: Anerobic culture Patient Medications llergies: penicillin, adhesive tape A Notifications Medication Indication Start End 06/28/2020 Bactrim DS DOSE 1 - oral 800 mg-160 mg tablet - 1 tablet oral taken 2 times per day for 14 days Electronic Signature(s) Signed: 06/28/2020 6:12:31 PM By: [name] RN, BSN Signed: 06/29/2020 1:30:16 PM By: [name] PA-C Previous Signature: 06/28/2020 4:25:47 PM Version By: [name] PA-C Entered By: [name] on 06/28/2020 17:27:31 -------------------------------------------------------------------------------- Problem List Details Patient Name: Date of Service: [facility] [name], [place] [name]  06/28/2020 2:45 PM Medical Record Number: [id number] Patient Account Number: [id number] Date of Birth/Sex: Treating RN: Oct 22, 1950 (69 y.o. [name] Primary Care Provider: Jenna [name] Other Clinician: Referring Provider: Treating Provider/Extender: [name] in Treatment: 20 Active Problems ICD-10 Encounter Code Description Active Date MDM Diagnosis I87.2 Venous insufficiency (chronic) (peripheral) 02/09/2020 No Yes E11.621 Type 2 diabetes mellitus with foot ulcer 02/09/2020 No Yes L97.522 Non-pressure chronic ulcer of other part of left foot with fat layer exposed 05/31/2020 No Yes N18.6 End stage renal disease 02/09/2020 No Yes Z99.2 Dependence on renal dialysis 02/09/2020 No Yes I10 Essential (primary) hypertension 02/09/2020 No Yes Inactive Problems Resolved Problems ICD-10 Code Description Active Date Resolved Date L97.822 Non-pressure chronic ulcer of other part of left lower leg with fat layer exposed 02/09/2020 02/09/2020 L97.312 Non-pressure chronic ulcer of right 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 06/28/2020 2:45 PM Medical Record Number: [id number] Patient Account Number: [id number] Date of Birth/Sex: Treating RN: 1950/12/17 (69 y.o. [name] Primary Care Provider: Dennard Schaumann,  Cletus Gash Other Clinician: Referring Provider: Treating Provider/Extender: [name] in Treatment: 20 Subjective Chief  Complaint Information obtained from Patient Multiple LE Ulcers History of Present Illness (HPI) Evaluate7/21/2021 today patient presents for evaluation of ulcerations on her legs which she tells me tend to come and go as far as small blistering locations and sometimes are better than other times. Right now she tells me that this is actually doing a little bit better but nonetheless will not completely go away. She has ulcerations bilaterally on her lower extremities. The right is worse than the left. 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 The patient has no evidence of active infection at this time. She however appears to have proficient blood flow into the extremities and I think would tolerate a 3 layer compression wrap she was noncompressible on arterial studies. Nonetheless there was no obvious signs of occlusion. 02/16/2020 on evaluation today patient appears to be doing much better in regard to her wounds in general. On the past week she has made significant improvements which is great news there is no signs of active infection at this time. No fevers, chills, nausea, vomiting, or diarrhea. 02/23/2020 on evaluation today patient appears to be doing well in regard to her lower extremity ulcers and ankle ulcers. Overall I feel like she is making great progress and in general I am extremely pleased with how things stand. There is no sign of active infection at this time which is also good news. She will require slight debridement around the right ankle region. 03/02/20-Patient returns at 1 week, has few new areas open on the left leg, especially 1 healed area on the medial ankle that has slight small open area now, to anterior leg areas that started out as blisters that are open now. She was using juxta lite compression to the left, she used Band-Aid to cover the vesicle that had  ruptured on the left anterior leg, we are using 3 layer compression on the right. We are using silver alginate to the toe wound on the left. 03/08/2020 upon evaluation today patient unfortunately is doing much worse in regard to her right leg although her left leg looks like is doing better. Again we have taken her compression wraps off last week since she was doing well and transitioned her to her Velcro compression wrap. However apparently the patient is stating this was not put on here in the clinic and she never put it on at home. With that being said unfortunately she developed swelling and opened up new wounds which are present today she has quite a few of them in fact. 03/15/2020 on evaluation today patient appears to be doing well with regard to her wounds on the lower extremities bilaterally. Fortunately there is no signs of active infection at this time. No fever chills noted. Overall I am actually very pleased with where things stand currently. 03/22/2020 on evaluation today patient appears to be doing fairly well in regard to her wounds in general. She just has really one area left on her toe everything else is healed on her legs. She does have her Velcro compression wraps which I think are appropriate at this point. Fortunately there is no signs of active infection at this time. No fevers, chills, nausea, vomiting, or diarrhea. 03/29/2020 on evaluation today patient appears to be doing poorly in regard to her right leg which is  pretty much completely reopened compared to last week. She states it was okay until Saturday when she woke up she had blisters. With that being said it appears that this is likely stemming from the fact that the patient is apparently though I just found this out today going to sleep in her bed with her legs up on the bed with her but throughout the night she tends to end up with her legs hanging off the side or bottom of the bed on the floor. With that being said I think  that this likely is happening much too long and then she has significant swelling the builds up as she sleeps. Obviously I think this is the reasoning behind what we are seeing currently. 04/05/2020 on evaluation today patient appears to be doing quite well with regard to her leg ulcers at this point. Fortunately there is no signs of anything significantly open again which is great news nonetheless I am to continue wrapping her for at least a couple of weeks due to the fact that she seems to continually reopen as soon as we unwrapped her. 04/12/2020 upon evaluation today patient appears to be doing excellent in regard to her leg ulcers all appear to be improving which is great news. She is also doing better in regard to the toe the infection seems to be under better control and the Santyl is helping to clean this up. Overall very pleased much more so than what I saw last week. 04/19/2020 on evaluation today patient appears to be doing well with regard to her leg ulcers which are showing no signs of opening at this point. Her toe is also showing signs of improvement she is about done with her antibiotic. Overall I feel like things are doing quite well. 04/26/2020 upon evaluation today patient appears to be doing unfortunately a little bit worse compared to previous. She has reopened wounds on the right anterior lower extremity. This is unfortunate as we were hoping that this would really maintain healing. That does not appear to be the case. I think with him the need to see about making a referral for venous studies and provider evaluation with [facility] imaging with the interventional radiologist. 08/11/2019 upon evaluation today patient appears to be doing well in general in regard to her legs. Her right leg she does have a new wound that opened since last week. On the left leg everything appears to be healed she does still have the opening on her toe which we are managing but again this does not appear  to be any worse. She just needs to get this to feeling and cover over. There is no evidence of infection which is at least great news. 05/17/2020 upon evaluation today patient appears to be doing decently well in regard to her leg ulcers this is good news. She does have still an opening on the left second toe. She has been let this dry out that not really what we want to see currently. For that reason I discussed with her again the fact that she needs to keep this covered and not allow it to dry out in order to allow for appropriate healing. She voiced understanding and states she did not know that. 05/24/2020 upon evaluation today patient's legs both appear to be still be completely healed which is great news. We are now going to transition into having her utilize her juxta lite compression wraps on the bilateral lower extremities. She is definitely ready for this. Her toe  also seems to be doing better. 05/31/2020 on evaluation today patient appears to be doing well with regard to her legs. There is nothing open at this point. Her toe still has some slough noted and get a clean that off today by way of sharp debridement other than that I feel like the collagen is probably still her best bet in that regard. 06/07/2020 upon evaluation today patient appears to be doing well with regard to her left leg and her toe ulcer the toe seems to be doing better the leg is doing okay. The right leg unfortunately she has new wounds I think her shoes are rubbing on the side of her ankle which is what is causing part of this issue. Fortunately there is no signs of active infection at this time which is great news. 06/14/2020 upon evaluation today patient appears to be doing well today in regard to most of her wounds. Everything seems to be showing some signs of improvement or else healing. Fortunately there is no signs of active infection at this time. No fevers, chills, nausea, vomiting, or diarrhea. She has been  using a silver collagen for her toe ulcer. Damage12/07/2019 on evaluation today patient appears to be doing okay in regard to her right medial ankle ulcer in her left toe ulcer. Both are a little bit better today visually but again she still is not doing as well as I like to see. She still continues to wear the worn-out 69 year old crocs which are not good for her to be honest I think it is causing and preventing healing. Fortunately there is no signs of active infection at this time. No fevers, chills, nausea, vomiting, or diarrhea. 06/28/20 upon evaluation today patient appears to be doing poorly in regard to her second toes bilaterally. These both appear to be rubbing on her shoes these are shoes that have asked him to get rid of every visit since have been seeing her. I had a much more lengthy conversation with her about it last week she told me she was going to get different shoes to wear I get she comes in with the same shoes on today. Nonetheless I think this is part of her problem. Obviously the left second toe appears to be infected at this point. The right necessarily see evidence of this but nonetheless is definitely been rubbing where it sticks up. Objective Constitutional Well-nourished and well-hydrated in no acute distress. Vitals Time Taken: 3:30 PM, Height: 67 in, Weight: 202 lbs, BMI: 31.6, Temperature: 98.4 F, Pulse: 103 bpm, Respiratory Rate: 16 breaths/min, Blood Pressure: 94/67 mmHg. General Notes: BP notified to case manager and PA. Patient denies any light headiness or dizziness. Respiratory normal breathing without difficulty. Psychiatric this patient is able to make decisions and demonstrates good insight into disease process. Alert and Oriented x 3. pleasant and cooperative. General Notes: Upon inspection patient's wound bed actually showed signs of good epithelization in regard to the ankle this is great news that appears to be healed I think she does not need to be  wrapped any longer. With that being said she does need to have dressings on her toes in order to pad them and protect them from rubbing she also needs to not be wearing these crocs which I told her several times each visit over the past several weeks that have seen her. Integumentary (Hair, Skin) Wound #17 status is Open. Original cause of wound was Gradually Appeared. The wound is located on the Right,Medial Malleolus. The wound  measures 0cm length x 0cm width x 0cm depth; 0cm^2 area and 0cm^3 volume. Wound #19 status is Open. Original cause of wound was Footwear Injury. The wound is located on the Right T Third. The wound measures 0.7cm length x oe 0.5cm width x 0.1cm depth; 0.275cm^2 area and 0.027cm^3 volume. There is Fat Layer (Subcutaneous Tissue) exposed. There is no tunneling or undermining noted. There is a medium amount of serosanguineous drainage noted. The wound margin is distinct with the outline attached to the wound base. There is small (1-33%) red granulation within the wound bed. There is a large (67-100%) amount of necrotic tissue within the wound bed including Eschar. Wound #8 status is Open. Original cause of wound was Blister. The wound is located on the Left T Third. The wound measures 0.9cm length x 0.8cm width x oe 0.2cm depth; 0.565cm^2 area and 0.113cm^3 volume. There is Fat Layer (Subcutaneous Tissue) exposed. There is no tunneling or undermining noted. There is a medium amount of serosanguineous drainage noted. The wound margin is flat and intact. There is small (1-33%) red granulation within the wound bed. There is a large (67-100%) amount of necrotic tissue within the wound bed including Adherent Slough. Assessment Active Problems ICD-10 Venous insufficiency (chronic) (peripheral) Type 2 diabetes mellitus with foot ulcer Non-pressure chronic ulcer of other part of left foot with fat layer exposed End stage renal disease Dependence on renal dialysis Essential  (primary) hypertension Procedures Wound #19 Pre-procedure diagnosis of Wound #19 is a Diabetic Wound/Ulcer of the Lower Extremity located on the Right T Third .Severity of Tissue Pre Debridement oe is: Fat layer exposed. There was a Chemical/Enzymatic/Mechanical debridement performed by [name], RN.Marland Kitchen Agent used was [employer]. A time out was conducted at 16:15, prior to the start of the procedure. There was no bleeding. The procedure was tolerated well. Post Debridement Measurements: 0.7cm length x 0.5cm width x 0.1cm depth; 0.027cm^3 volume. Character of Wound/Ulcer Post Debridement requires further debridement. Severity of Tissue Post Debridement is: Fat layer exposed. Post procedure Diagnosis Wound #19: Same as Pre-Procedure Wound #8 Pre-procedure diagnosis of Wound #8 is a Diabetic Wound/Ulcer of the Lower Extremity located on the Left T Third .Severity of Tissue Pre Debridement is: oe Fat layer exposed. There was a Chemical/Enzymatic/Mechanical debridement performed by [name], RN.Marland Kitchen Agent used was [employer]. A time out was conducted at 16:15, prior to the start of the procedure. There was no bleeding. The procedure was tolerated well. Post Debridement Measurements: 0.9cm length x 0.8cm width x 0.2cm depth; 0.113cm^3 volume. Character of Wound/Ulcer Post Debridement requires further debridement. Severity of Tissue Post Debridement is: Fat layer exposed. Post procedure Diagnosis Wound #8: Same as Pre-Procedure Plan Follow-up Appointments: Return Appointment in 1 week. Bathing/ Shower/ Hygiene: May shower and wash wound with soap and water. Edema Control - Lymphedema / SCD / Other: Elevate legs to the level of the heart or above for 30 minutes daily and/or when sitting, a frequency of: Avoid standing for long periods of time. Exercise regularly Moisturize legs daily. Compression stocking or Garment 20-30 mm/Hg pressure to: - juxtalites to both legs daily, apply in the morning and  removed at night Laboratory ordered were: Anerobic culture - left 3rd toe The following medication(s) was prescribed: Bactrim DS oral 800 mg-160 mg tablet 1 1 tablet oral taken 2 times per day for 14 days starting 06/28/2020 WOUND #19: - T Third Wound Laterality: Right oe Prim Dressing: Santyl Ointment 1 x Per Day/ ary Discharge Instructions: Apply nickel thick  amount to wound bed as instructed Secondary Dressing: Woven Gauze Sponges 2x2 in 1 x Per Day/ Discharge Instructions: Apply over primary dressing as directed. Secured With: Child psychotherapist, Sterile 2x75 (in/in) 1 x Per Day/ Discharge Instructions: Secure with stretch gauze as directed. WOUND #8: - T Third Wound Laterality: Left oe Prim Dressing: Santyl Ointment 1 x Per Day/ ary Discharge Instructions: Apply nickel thick amount to wound bed as instructed Secondary Dressing: Woven Gauze Sponges 2x2 in 1 x Per Day/ Discharge Instructions: Apply over primary dressing as directed. Secured With: Child psychotherapist, Sterile 2x75 (in/in) 1 x Per Day/ Discharge Instructions: Secure with stretch gauze as directed. 1. Would recommend currently that we actually have the patient go ahead and continue with the wound care measures as before with regard to Santyl thus which has been using over the past couple of days we were using collagen before but to be honest that does not seem to be appropriate at this point. 2. I am going to recommend currently that we have the patient go ahead and continue to pad these with foam followed by a Band-Aid to try to keep pressure off. 3. She needs to toss her shoes currently in the trash as they are terrible for her feet and are causing more harm than they are helping her at this point to be perfectly honest. She needs to be wearing different shoes she tells me she has some yet she continues to wear the shoes. 4. I did go ahead and obtain a wound culture today we will see what this  shows but I am also sending a prescription for Bactrim DS for her. We will see patient back for reevaluation in 1 week here in the clinic. If anything worsens or changes patient will contact our office for additional recommendations. Electronic Signature(s) Signed: 06/28/2020 6:12:31 PM By: [name] RN, BSN Signed: 06/29/2020 1:30:16 PM By: [name] PA-C Previous Signature: 06/28/2020 4:26:02 PM Version By: [name] PA-C Previous Signature: 06/28/2020 4:21:15 PM Version By: [name] PA-C Entered By: [name] on 06/28/2020 17:27:42 -------------------------------------------------------------------------------- SuperBill Details Patient Name: Date of Service: [name], [place] [name] 06/28/2020 Medical Record Number: [id number] Patient Account Number: [id number] Date of Birth/Sex: Treating RN: 1951/06/29 (69 y.o. [name] Primary Care Provider: Jenna [name] Other Clinician: Referring Provider: Treating Provider/Extender: [name] in Treatment: 20 Diagnosis Coding ICD-10 Codes Code Description I87.2 Venous insufficiency (chronic) (peripheral) E11.621 Type 2 diabetes mellitus with foot ulcer L97.522 Non-pressure chronic ulcer of other part of left foot with fat layer exposed N18.6 End stage renal disease Z99.2 Dependence on renal dialysis I10 Essential (primary) hypertension Facility Procedures CPT4 Code: [id number] Description: 7602 - DEBRIDE W/O ANES NON SELECT Modifier: Quantity: 1 Physician Procedures : CPT4 Code Description Modifier [id number] - WC PHYS LEVEL 4 - EST PT ICD-10 Diagnosis Description I87.2 Venous insufficiency (chronic) (peripheral) E11.621 Type 2 diabetes mellitus with foot ulcer L97.522 Non-pressure chronic ulcer of other part of  left foot with fat layer exposed N18.6 End stage renal disease Quantity: 1 Electronic Signature(s) Signed: 06/28/2020 6:12:31 PM By: [name] RN, BSN Signed:  06/29/2020 1:30:16 PM By: [name] PA-C Previous Signature: 06/28/2020 4:21:28 PM Version By: [name] PA-C Entered By: [name] on 06/28/2020 17:26:37

## 2020-06-28 NOTE — Progress Notes (Signed)
KENSY, BLIZARD ([id number]) Visit Report for 06/28/2020 Arrival Information Details Patient Name: Date of Service: [name] [name] [name], [name] 06/28/2020 2:45 PM Medical Record Number: [id number] Patient Account Number: [id number] Date of Birth/Sex: Treating RN: August 16, 1950 (69 y.o. [name], [name] Primary Care Blanton Kardell: [name] Other Clinician: Referring Sabrinna Yearwood: Treating Tiajuana Leppanen/Extender: Christella Hartigan in Treatment: 38 Visit Information History Since Last Visit Added or deleted any medications: No Patient Arrived: Wheel Chair Any new allergies or adverse reactions: No Arrival Time: 15:30 Had a fall or experienced change in No Accompanied By: self activities of daily living that may affect Transfer Assistance: None risk of falls: Patient Identification Verified: Yes Signs or symptoms of abuse/neglect since last visito No Secondary Verification Process Completed: Yes Hospitalized since last visit: No Patient Requires Transmission-Based Precautions: No Implantable device outside of the clinic excluding No Patient Has Alerts: Yes cellular tissue based products placed in the center Patient Alerts: Right ABI: [place] since last visit: Left ABI: [place] Has Dressing in Place as Prescribed: Yes Has Compression in Place as Prescribed: Yes Pain Present Now: No Electronic Signature(s) Signed: 06/28/2020 5:17:43 PM By: [name] Entered By: [name] on 06/28/2020 15:47:15 -------------------------------------------------------------------------------- Encounter Discharge Information Details Patient Name: Date of Service: [name] [name], [place] [name] 06/28/2020 2:45 PM Medical Record Number: [id number] Patient Account Number: [id number] Date of Birth/Sex: Treating RN: 03/23/1951 (69 y.o. [name] Primary Care Sayan Aldava: [name] Other Clinician: Referring Jance Siek: Treating Ronee Ranganathan/Extender: Christella Hartigan in Treatment: 20 Encounter  Discharge Information Items Discharge Condition: Stable Ambulatory Status: Wheelchair Discharge Destination: Home Transportation: Private Auto Accompanied By: self Schedule Follow-up Appointment: Yes Clinical Summary of Care: Patient Declined Electronic Signature(s) Signed: 06/28/2020 5:41:55 PM By: [name] RN Entered By: [name] on 06/28/2020 16:33:35 -------------------------------------------------------------------------------- Lower Extremity Assessment Details Patient Name: Date of Service: [facility] [name], [name] 06/28/2020 2:45 PM Medical Record Number: [id number] Patient Account Number: [id number] Date of Birth/Sex: Treating RN: 08/09/1950 (69 y.o. [name] [name] Primary Care Danaria Larsen: [name] Other Clinician: Referring Terral Cooks: Treating Kamerin Axford/Extender: Christella Hartigan in Treatment: 20 Edema Assessment Assessed: [Left: Yes] [Right: Yes] Edema: [Left: No] [Right: No] Calf Left: Right: Point of Measurement: 38 cm From Medial Instep 34.5 cm 32.5 cm Ankle Left: Right: Point of Measurement: 13 cm From Medial Instep 23.5 cm 23 cm Electronic Signature(s) Signed: 06/28/2020 5:17:43 PM By: [name] Entered By: [name] on 06/28/2020 15:48:57 -------------------------------------------------------------------------------- [place] Details Patient Name: Date of Service: [facility] [name], [name] [name] [name] 06/28/2020 2:45 PM Medical Record Number: [id number] Patient Account Number: [id number] Date of Birth/Sex: Treating RN: [birth year] (69 y.o. [name] [name] Primary Care Alano Blasco: [name] Other Clinician: Referring Tab Rylee: Treating Maimuna Leaman/Extender: Christella Hartigan in Treatment: 20 Active Inactive Venous Leg Ulcer Nursing Diagnoses: Knowledge deficit related to disease process and management Potential for venous Insuffiency (use before diagnosis confirmed) Goals: Patient will  maintain optimal edema control Date Initiated: 02/09/2020 Target Resolution Date: 07/05/2020 Goal Status: Active Patient/caregiver will verbalize understanding of disease process and disease management Date Initiated: 02/09/2020 Date Inactivated: 03/02/2020 Target Resolution Date: 03/08/2020 Goal Status: Met Interventions: Assess peripheral edema status every visit. Compression as ordered Provide education on venous insufficiency Treatment Activities: Therapeutic compression applied : 02/09/2020 Notes: Wound/Skin Impairment Nursing Diagnoses: Impaired tissue integrity Knowledge deficit related to ulceration/compromised skin integrity Goals: Patient/caregiver will verbalize understanding of skin care regimen Date Initiated: 02/09/2020 Target Resolution Date: 07/05/2020 Goal  Status: Active Ulcer/skin breakdown will have a volume reduction of 30% by week 4 Date Initiated: 02/09/2020 Date Inactivated: 03/02/2020 Target Resolution Date: 03/08/2020 Goal Status: Met Interventions: Assess patient/caregiver ability to obtain necessary supplies Assess patient/caregiver ability to perform ulcer/skin care regimen upon admission and as needed Assess ulceration(s) every visit Provide education on ulcer and skin care Treatment Activities: Skin care regimen initiated : 02/09/2020 Topical wound management initiated : 02/09/2020 Notes: Electronic Signature(s) Signed: 06/28/2020 6:12:31 PM By: Zenaida Deed RN, BSN Entered By: Zenaida Deed on 06/28/2020 16:14:34 -------------------------------------------------------------------------------- Pain Assessment Details Patient Name: Date of Service: [name] [name], [name] 06/28/2020 2:45 PM Medical Record Number: [id number] Patient Account Number: [id number] Date of Birth/Sex: Treating RN: 03/16/1951 (69 y.o. [name] Primary Care Shereda Graw: Lynnea Ferrier Other Clinician: Referring Ezekiah Massie: Treating Larene Ascencio/Extender: Karl Luke in Treatment: 20 Active Problems Location of Pain Severity and Description of Pain Patient Has Paino No Site Locations Rate the pain. Current Pain Level: 0 Pain Management and Medication Current Pain Management: Medication: No Cold Application: No Rest: No Massage: No Activity: No T.E.N.S.: No Heat Application: No Leg drop or elevation: No Is the Current Pain Management Adequate: Adequate How does your wound impact your activities of daily livingo Sleep: No Bathing: No Appetite: No Relationship With Others: No Bladder Continence: No Emotions: No Bowel Continence: No Work: No Toileting: No Drive: No Dressing: No Hobbies: No Electronic Signature(s) Signed: 06/28/2020 5:17:43 PM By: Shawn Stall Entered By: Shawn Stall on 06/28/2020 15:48:34 -------------------------------------------------------------------------------- Wound Assessment Details Patient Name: Date of Service: [name] [name], [name] 06/28/2020 2:45 PM Medical Record Number: [id number] Patient Account Number: [id number] Date of Birth/Sex: Treating RN: Dec 01, 1950 (69 y.o. Debara Pickett, [place] Primary Care Salam Micucci: Lynnea Ferrier Other Clinician: Referring Khye Hochstetler: Treating Paetyn Pietrzak/Extender: Karl Luke in Treatment: 20 Wound Status Wound Number: 17 Primary Etiology: Diabetic Wound/Ulcer of the Lower Extremity Wound Location: Right, Medial Malleolus Wound Status: Open Wounding Event: Gradually Appeared Date Acquired: 06/07/2020 Weeks Of Treatment: 3 Clustered Wound: No Wound Measurements Length: (cm) Width: (cm) Depth: (cm) Area: (cm) Volume: (cm) 0 % Reduction in Area: 100% 0 % Reduction in Volume: 100% 0 0 0 Wound Description Classification: Grade 2 Electronic Signature(s) Signed: 06/28/2020 5:17:43 PM By: Shawn Stall Entered By: Shawn Stall on 06/28/2020  15:46:02 -------------------------------------------------------------------------------- Wound Assessment Details Patient Name: Date of Service: [name] [name], [name] 06/28/2020 2:45 PM Medical Record Number: [id number] Patient Account Number: [id number] Date of Birth/Sex: Treating RN: [birth year] (69 y.o. [name] Primary Care Znya Albino: Lynnea Ferrier Other Clinician: Referring Sadhana Frater: Treating Andry Bogden/Extender: Karl Luke in Treatment: 20 Wound Status Wound Number: 19 Primary Diabetic Wound/Ulcer of the Lower Extremity Etiology: Wound Location: Right T Third oe Wound Open Wounding Event: Footwear Injury Status: Date Acquired: 06/25/2020 Comorbid Cataracts, Anemia, Asthma, Hypertension, Type II Diabetes, End Weeks Of Treatment: 0 History: Stage Renal Disease, Osteomyelitis, Neuropathy Clustered Wound: No Wound Measurements Length: (cm) 0.7 Width: (cm) 0.5 Depth: (cm) 0.1 Area: (cm) 0.275 Volume: (cm) 0.027 % Reduction in Area: % Reduction in Volume: Epithelialization: None Tunneling: No Undermining: No Wound Description Classification: Grade 2 Wound Margin: Distinct, outline attached Exudate Amount: Medium Exudate Type: Serosanguineous Exudate Color: red, brown Foul Odor After Cleansing: No Slough/Fibrino No Wound Bed Granulation Amount: Small (1-33%) Exposed Structure Granulation Quality: Red Fascia Exposed: No Necrotic Amount: Large (67-100%) Fat Layer (Subcutaneous Tissue) Exposed: Yes Necrotic Quality: Eschar Tendon Exposed: No Muscle Exposed: No Joint  Exposed: No Bone Exposed: No Treatment Notes Wound #19 (Toe Third) Wound Laterality: Right Cleanser Peri-Wound Care Topical Primary Dressing Santyl Ointment Discharge Instruction: Apply nickel thick amount to wound bed as instructed Secondary Dressing Woven Gauze Sponges 2x2 in Discharge Instruction: Apply over primary dressing as directed. Secured  With Conforming Stretch Gauze Bandage, Sterile 2x75 (in/in) Discharge Instruction: Secure with stretch gauze as directed. Compression Wrap Compression Stockings Add-Ons Electronic Signature(s) Signed: 06/28/2020 5:17:43 PM By: [name] Entered By: [name] on 06/28/2020 15:44:38 -------------------------------------------------------------------------------- Wound Assessment Details Patient Name: Date of Service: [name] [name], [place] [name] 06/28/2020 2:45 PM Medical Record Number: [id number] Patient Account Number: [id number] Date of Birth/Sex: Treating RN: 22-Jun-1951 (69 y.o. [name], [place].Reding Primary Care Zeke Aker: [name] Other Clinician: Referring Bayley Yar[place]: Treating Jalyssa Fleisher/Extender: Christella Hartigan in Treatment: 20 Wound Status Wound Number: 8 Primary Diabetic Wound/Ulcer of the Lower Extremity Etiology: Wound Location: Left T Third oe Wound Open Wounding Event: Blister Status: Date Acquired: 02/23/2020 Comorbid Cataracts, Anemia, Asthma, Hypertension, Type II Diabetes, End Weeks Of Treatment: 18 History: Stage Renal Disease, Osteomyelitis, Neuropathy Clustered Wound: No Wound Measurements Length: (cm) 0.9 Width: (cm) 0.8 Depth: (cm) 0.2 Area: (cm) 0.565 Volume: (cm) 0.113 % Reduction in Area: -300.7% % Reduction in Volume: -707.1% Epithelialization: Medium (34-66%) Tunneling: No Undermining: No Wound Description Classification: Grade 2 Wound Margin: Flat and Intact Exudate Amount: Medium Exudate Type: Serosanguineous Exudate Color: red, brown Foul Odor After Cleansing: No Slough/Fibrino Yes Wound Bed Granulation Amount: Small (1-33%) Exposed Structure Granulation Quality: Red Fascia Exposed: No Necrotic Amount: Large (67-100%) Fat Layer (Subcutaneous Tissue) Exposed: Yes Necrotic Quality: Adherent Slough Tendon Exposed: No Muscle Exposed: No Joint Exposed: No Bone Exposed: No Treatment Notes Wound #8 (Toe Third)  Wound Laterality: Left Cleanser Peri-Wound Care Topical Primary Dressing Santyl Ointment Discharge Instruction: Apply nickel thick amount to wound bed as instructed Secondary Dressing Woven Gauze Sponges 2x2 in Discharge Instruction: Apply over primary dressing as directed. Secured With Conforming Stretch Gauze Bandage, Sterile 2x75 (in/in) Discharge Instruction: Secure with stretch gauze as directed. Compression Wrap Compression Stockings Add-Ons Electronic Signature(s) Signed: 06/28/2020 5:17:43 PM By: [name] Entered By: [name] on 06/28/2020 15:46:43 -------------------------------------------------------------------------------- Vitals Details Patient Name: Date of Service: [name] [name], [place] [name] 06/28/2020 2:45 PM Medical Record Number: [id number] Patient Account Number: [id number] Date of Birth/Sex: Treating RN: 1950-09-16 (69 y.o. [name], [name] Primary Care Tiffanee Mcnee: [name] Other Clinician: Referring Lorena Clearman: Treating Kendria Halberg/Extender: Christella Hartigan in Treatment: 20 Vital Signs Time Taken: 15:30 Temperature (F): 98.4 Height (in): 67 Pulse (bpm): 103 Weight (lbs): 202 Respiratory Rate (breaths/min): 16 Body Mass Index (BMI): 31.6 Blood Pressure (mmHg): 94/67 Reference Range: 80 - 120 mg / dl Notes BP notified to case manager and PA. Patient denies any light headiness or dizziness. Electronic Signature(s) Signed: 06/28/2020 5:17:43 PM By: [name] Entered By: [name] on 06/28/2020 15:48:25

## 2020-06-29 ENCOUNTER — Ambulatory Visit: Payer: Self-pay | Admitting: Pharmacist

## 2020-06-29 ENCOUNTER — Other Ambulatory Visit (HOSPITAL_COMMUNITY)
Admit: 2020-06-29 | Discharge: 2020-06-29 | Disposition: A | Discharge status: Home / Self Care | Payer: Medicare Other | Source: Ambulatory Visit | Attending: Physician Assistant | Admitting: Physician Assistant

## 2020-06-29 DIAGNOSIS — D631 Anemia in chronic kidney disease: Secondary | ICD-10-CM | POA: Diagnosis not present

## 2020-06-29 DIAGNOSIS — L089 Local infection of the skin and subcutaneous tissue, unspecified: Secondary | ICD-10-CM | POA: Insufficient documentation

## 2020-06-29 DIAGNOSIS — N2581 Secondary hyperparathyroidism of renal origin: Secondary | ICD-10-CM | POA: Diagnosis not present

## 2020-06-29 DIAGNOSIS — D689 Coagulation defect, unspecified: Secondary | ICD-10-CM | POA: Diagnosis not present

## 2020-06-29 DIAGNOSIS — D509 Iron deficiency anemia, unspecified: Secondary | ICD-10-CM | POA: Diagnosis not present

## 2020-06-29 DIAGNOSIS — Z992 Dependence on renal dialysis: Secondary | ICD-10-CM | POA: Diagnosis not present

## 2020-06-29 DIAGNOSIS — N186 End stage renal disease: Secondary | ICD-10-CM | POA: Diagnosis not present

## 2020-06-29 NOTE — Chronic Care Management (AMB) (Signed)
Chronic Care Management   Follow Up Note   07/20/2020 Name: [name] MRN: [id number] DOB: 06-05-1951  Referred by: [name], MD Reason for referral : Chronic Care Management   ANAB VIVAR is a 69 y.o. year old female who is a primary care patient of Pickard, Cammie Mcgee, MD. The CCM team was consulted for assistance with chronic disease management and care coordination needs.    Review of patient status, including review of consultants reports, relevant laboratory and other test results, and collaboration with appropriate care team members and the patient's provider was performed as part of comprehensive patient evaluation and provision of chronic care management services.    SDOH (Social Determinants of Health) assessments performed: No See Care Plan activities for detailed interventions related to [facility])     Outpatient Encounter Medications as of 06/29/2020  Medication Sig  . Blood Glucose Monitoring Suppl (ONE TOUCH ULTRA 2) w/Device KIT Use to check BS BID-QID Dx:E11.9  . BREO ELLIPTA 100-25 MCG/INH AEPB Inhale 1 puff by mouth once daily (Patient taking differently: Inhale 1 puff into the lungs daily. )  . Carboxymethylcellulose Sodium (THERATEARS OP) Place 1 drop into both eyes daily as needed (dry eyes).  . diphenhydrAMINE (BENADRYL) 25 MG tablet Take 25 mg by mouth daily as needed for allergies.  Marland Kitchen gabapentin (NEURONTIN) 300 MG capsule TAKE 1 CAPSULE BY MOUTH THREE TIMES DAILY. REQUIRES OFFICE VISIT BEFORE ANY FURTHER REFILLS CAN BE GIVEN  . insulin degludec (TRESIBA FLEXTOUCH) 100 UNIT/ML FlexTouch Pen Inject 0.3 mLs (30 Units total) into the skin daily. Hold if fasting blood sugars are <130.  Marland Kitchen Insulin Pen Needle (LIVE BETTER PEN NEEDLES) 31G X 6 MM MISC To use with Tresiba pens daily  . Lancets (ONETOUCH ULTRASOFT) lancets Use as instructed  . metroNIDAZOLE (FLAGYL) 500 MG tablet Take 1 tablet (500 mg total) by mouth 2 (two) times daily.  . multivitamin (RENA-VIT)  TABS tablet Take 1 tablet by mouth daily.  Glory Rosebush ULTRA test strip USE AS DIRECTED TO MONITOR  FSBS 3 TIMES DAILY  . oxyCODONE-acetaminophen (PERCOCET) 5-325 MG tablet Take 1 tablet by mouth every 6 (six) hours as needed for severe pain. (Patient not taking: Reported on 03/01/2020)  . PROAIR HFA 108 (90 Base) MCG/ACT inhaler INHALE 2 PUFFS BY MOUTH EVERY 6 HOURS AS NEEDED FOR WHEEZING FOR SHORTNESS OF BREATH (Patient taking differently: Inhale 2 puffs into the lungs every 6 (six) hours as needed for wheezing or shortness of breath. )  . rosuvastatin (CRESTOR) 20 MG tablet Take 1 tablet (20 mg total) by mouth daily.   Facility-Administered Encounter Medications as of 06/29/2020  Medication  . Bevacizumab (AVASTIN) SOLN 1.25 mg  . Bevacizumab (AVASTIN) SOLN 1.25 mg  . Bevacizumab (AVASTIN) SOLN 1.25 mg  . Bevacizumab (AVASTIN) SOLN 1.25 mg  . Bevacizumab (AVASTIN) SOLN 1.25 mg     Recent Relevant Labs: Lab Results  Component Value Date/Time   HGBA1C 6.9 (H) 02/07/2020 11:59 AM   HGBA1C 6.9 (H) 10/29/2019 03:30 PM   MICROALBUR 97.9 07/07/2017 12:13 PM   MICROALBUR 47.9 12/30/2016 11:53 AM    Kidney Function Lab Results  Component Value Date/Time   CREATININE 5.63 (H) 05/19/2020 12:54 PM   CREATININE 6.19 (H) 02/07/2020 11:59 AM   CREATININE 7.38 (H) 11/08/2019 08:52 AM   CREATININE 4.00 (H) 11/03/2019 11:31 AM   GFRNONAA 8 (L) 05/19/2020 12:54 PM   GFRNONAA 6 (L) 02/07/2020 11:59 AM   GFRAA 7 (L) 02/07/2020 11:59 AM    .  Current antihyperglycemic regimen:  o Tresiba 30 units daily - patient at last visit had upped the dose to 40 units   An unsuccessful telephone outreach was attempted today. The patient was referred to the pharmacist for assistance with care management and care coordination.   Follow Up Plan: Will move DM follow up to next month.   Reviewing chart also noted her current GFR of 8.  Would recommend we decrease her dose of rosuvastatin to 84m daily as  recommended once GFR < 30.  Will consult with PCP on this dose decrease.    CBeverly Milch PharmD Clinical Pharmacist [place]([phone]

## 2020-07-01 DIAGNOSIS — Z992 Dependence on renal dialysis: Secondary | ICD-10-CM | POA: Diagnosis not present

## 2020-07-01 DIAGNOSIS — N186 End stage renal disease: Secondary | ICD-10-CM | POA: Diagnosis not present

## 2020-07-01 DIAGNOSIS — D631 Anemia in chronic kidney disease: Secondary | ICD-10-CM | POA: Diagnosis not present

## 2020-07-01 DIAGNOSIS — D509 Iron deficiency anemia, unspecified: Secondary | ICD-10-CM | POA: Diagnosis not present

## 2020-07-01 DIAGNOSIS — N2581 Secondary hyperparathyroidism of renal origin: Secondary | ICD-10-CM | POA: Diagnosis not present

## 2020-07-01 DIAGNOSIS — D689 Coagulation defect, unspecified: Secondary | ICD-10-CM | POA: Diagnosis not present

## 2020-07-01 LAB — AEROBIC CULTURE W GRAM STAIN (SUPERFICIAL SPECIMEN): Gram Stain: NONE SEEN

## 2020-07-03 ENCOUNTER — Telehealth: Payer: Self-pay

## 2020-07-03 DIAGNOSIS — Z85528 Personal history of other malignant neoplasm of kidney: Secondary | ICD-10-CM | POA: Diagnosis not present

## 2020-07-03 NOTE — Telephone Encounter (Signed)
Spoke with patient about Provider needing to see her at [facility] as soon as possible.

## 2020-07-03 NOTE — Telephone Encounter (Signed)
Left [name] the sister of [name] a vm to call office back to set up appointment for [name]. Her sister makes her appointments for her.

## 2020-07-04 DIAGNOSIS — N186 End stage renal disease: Secondary | ICD-10-CM | POA: Diagnosis not present

## 2020-07-04 DIAGNOSIS — D689 Coagulation defect, unspecified: Secondary | ICD-10-CM | POA: Diagnosis not present

## 2020-07-04 DIAGNOSIS — Z992 Dependence on renal dialysis: Secondary | ICD-10-CM | POA: Diagnosis not present

## 2020-07-04 DIAGNOSIS — N2581 Secondary hyperparathyroidism of renal origin: Secondary | ICD-10-CM | POA: Diagnosis not present

## 2020-07-04 DIAGNOSIS — D631 Anemia in chronic kidney disease: Secondary | ICD-10-CM | POA: Diagnosis not present

## 2020-07-04 DIAGNOSIS — D509 Iron deficiency anemia, unspecified: Secondary | ICD-10-CM | POA: Diagnosis not present

## 2020-07-05 ENCOUNTER — Encounter (HOSPITAL_BASED_OUTPATIENT_CLINIC_OR_DEPARTMENT_OTHER): Payer: Medicare Other | Admitting: Physician Assistant

## 2020-07-05 ENCOUNTER — Other Ambulatory Visit: Payer: Self-pay

## 2020-07-05 DIAGNOSIS — Z992 Dependence on renal dialysis: Secondary | ICD-10-CM | POA: Diagnosis not present

## 2020-07-05 DIAGNOSIS — I872 Venous insufficiency (chronic) (peripheral): Secondary | ICD-10-CM | POA: Diagnosis not present

## 2020-07-05 DIAGNOSIS — E11621 Type 2 diabetes mellitus with foot ulcer: Secondary | ICD-10-CM | POA: Diagnosis not present

## 2020-07-05 DIAGNOSIS — L97522 Non-pressure chronic ulcer of other part of left foot with fat layer exposed: Secondary | ICD-10-CM | POA: Diagnosis not present

## 2020-07-05 DIAGNOSIS — E1122 Type 2 diabetes mellitus with diabetic chronic kidney disease: Secondary | ICD-10-CM | POA: Diagnosis not present

## 2020-07-05 DIAGNOSIS — N186 End stage renal disease: Secondary | ICD-10-CM | POA: Diagnosis not present

## 2020-07-05 DIAGNOSIS — E1140 Type 2 diabetes mellitus with diabetic neuropathy, unspecified: Secondary | ICD-10-CM | POA: Diagnosis not present

## 2020-07-05 DIAGNOSIS — L97512 Non-pressure chronic ulcer of other part of right foot with fat layer exposed: Secondary | ICD-10-CM | POA: Diagnosis not present

## 2020-07-05 DIAGNOSIS — I120 Hypertensive chronic kidney disease with stage 5 chronic kidney disease or end stage renal disease: Secondary | ICD-10-CM | POA: Diagnosis not present

## 2020-07-05 NOTE — Progress Notes (Signed)
ONETA, SIGMAN ([id number]) Visit Report for 07/05/2020 Arrival Information Details Patient Name: Date of Service: [facility] [name], [name] 07/05/2020 9:15 A M Medical Record Number: [id number] Patient Account Number: [id number] Date of Birth/Sex: Treating RN: Mar 08, 1951 (69 y.o. Voncille Lo, [place] Primary Care Monserrat Vidaurri: [name] Other Clinician: Referring Zohaib Heeney: Treating Zya Finkle/Extender: Christella Hartigan in Treatment: 21 Visit Information History Since Last Visit All ordered tests and consults were completed: No Patient Arrived: Wheel Chair Added or deleted any medications: No Arrival Time: 09:32 Any new allergies or adverse reactions: No Accompanied By: self Had a fall or experienced change in No Transfer Assistance: None activities of daily living that may affect Patient Identification Verified: Yes risk of falls: Secondary Verification Process Completed: Yes Signs or symptoms of abuse/neglect since last visito No Patient Requires Transmission-Based Precautions: No Hospitalized since last visit: No Patient Has Alerts: Yes Implantable device outside of the clinic excluding No Patient Alerts: Right ABI: [place] cellular tissue based products placed in the center Left ABI: [place] since last visit: Has Dressing in Place as Prescribed: Yes Has Compression in Place as Prescribed: Yes Pain Present Now: No Electronic Signature(s) Signed: 07/05/2020 5:05:31 PM By: [name] RN Entered By: [name] on 07/05/2020 09:34:11 -------------------------------------------------------------------------------- Encounter Discharge Information Details Patient Name: Date of Service: [facility] [name], [place] [name] 07/05/2020 9:15 A M Medical Record Number: [id number] Patient Account Number: [id number] Date of Birth/Sex: Treating RN: 11-05-1950 (69 y.o. [name] Primary Care Helyn Schwan: [name] Other Clinician: Referring Paden Senger: Treating Jeyli Zwicker/Extender: Christella Hartigan in Treatment: 21 Encounter Discharge Information Items Post Procedure Vitals Discharge Condition: Stable Temperature (F): 98 Ambulatory Status: Wheelchair Pulse (bpm): 91 Discharge Destination: Home Respiratory Rate (breaths/min): 18 Transportation: Private Auto Blood Pressure (mmHg): 131/78 Accompanied By: self Schedule Follow-up Appointment: Yes Clinical Summary of Care: Patient Declined Electronic Signature(s) Signed: 07/05/2020 5:05:31 PM By: [name] RN Entered By: [name] on 07/05/2020 10:45:24 -------------------------------------------------------------------------------- Lower Extremity Assessment Details Patient Name: Date of Service: [facility] [name], [name] 07/05/2020 9:15 A M Medical Record Number: [id number] Patient Account Number: [id number] Date of Birth/Sex: Treating RN: [birth year] (69 y.o. [name] Primary Care Ladell Bey: [name] Other Clinician: Referring Nickholas Goldston: Treating Mailynn Everly/Extender: Christella Hartigan in Treatment: 21 Edema Assessment Assessed: [Left: No] [Right: No] Edema: [Left: No] [Right: No] Calf Left: Right: Point of Measurement: 38 cm From Medial Instep 34 cm 33 cm Ankle Left: Right: Point of Measurement: 13 cm From Medial Instep 23 cm 23 cm Electronic Signature(s) Signed: 07/05/2020 5:05:31 PM By: [name] RN Entered By: [name] on 07/05/2020 09:36:56 -------------------------------------------------------------------------------- [place] Details Patient Name: Date of Service: [facility] [name], [place] [name] 07/05/2020 9:15 A M Medical Record Number: [id number] Patient Account Number: [id number] Date of Birth/Sex: Treating RN: 06/03/1951 (69 y.o. [name] Primary Care Emanuele Mcwhirter: [name] Other Clinician: Referring Keishla Oyer: Treating Teoman Giraud/Extender: Christella Hartigan in Treatment: 21 Active Inactive Venous Leg  Ulcer Nursing Diagnoses: Knowledge deficit related to disease process and management Potential for venous Insuffiency (use before diagnosis confirmed) Goals: Patient will maintain optimal edema control Date Initiated: 02/09/2020 Target Resolution Date: 08/02/2020 Goal Status: Active Patient/caregiver will verbalize understanding of disease process and disease management Date Initiated: 02/09/2020 Date Inactivated: 03/02/2020 Target Resolution Date: 03/08/2020 Goal Status: Met Interventions: Assess peripheral edema status every visit. Compression as ordered Provide education on venous insufficiency Treatment Activities: Therapeutic compression applied : 02/09/2020 Notes:  Wound/Skin Impairment Nursing Diagnoses: Impaired tissue integrity Knowledge deficit related to ulceration/compromised skin integrity Goals: Patient/caregiver will verbalize understanding of skin care regimen Date Initiated: 02/09/2020 Target Resolution Date: 08/02/2020 Goal Status: Active Ulcer/skin breakdown will have a volume reduction of 30% by week 4 Date Initiated: 02/09/2020 Date Inactivated: 03/02/2020 Target Resolution Date: 03/08/2020 Goal Status: Met Interventions: Assess patient/caregiver ability to obtain necessary supplies Assess patient/caregiver ability to perform ulcer/skin care regimen upon admission and as needed Assess ulceration(s) every visit Provide education on ulcer and skin care Treatment Activities: Skin care regimen initiated : 02/09/2020 Topical wound management initiated : 02/09/2020 Notes: Electronic Signature(s) Signed: 07/05/2020 5:29:15 PM By: [name] RN, BSN Entered By: [name] on 07/05/2020 10:25:19 -------------------------------------------------------------------------------- Pain Assessment Details Patient Name: Date of Service: [name], [place] [name] 07/05/2020 9:15 A M Medical Record Number: [id number] Patient Account Number: [id number] Date of  Birth/Sex: Treating RN: 1950/09/26 (69 y.o. [name] Primary Care Gricelda Foland: [name] Other Clinician: Referring Lovel Suazo: Treating Anastasiya Gowin/Extender: Christella Hartigan in Treatment: 21 Active Problems Location of Pain Severity and Description of Pain Patient Has Paino No Site Locations Pain Management and Medication Current Pain Management: Electronic Signature(s) Signed: 07/05/2020 5:05:31 PM By: [name] RN Entered By: [name] on 07/05/2020 09:34:52 -------------------------------------------------------------------------------- Patient/Caregiver Education Details Patient Name: Date of Service: [facility] [name], [place] [name] 12/15/2021andnbsp9:15 A M Medical Record Number: [id number] Patient Account Number: [id number] Date of Birth/Gender: Treating RN: 1951-01-13 (69 y.o. [name] Primary Care Physician: [name] Other Clinician: Referring Physician: Treating Physician/Extender: Christella Hartigan in Treatment: 21 Education Assessment Education Provided To: Patient Education Topics Provided Pressure: Methods: Explain/Verbal Responses: Reinforcements needed, State content correctly Wound/Skin Impairment: Methods: Explain/Verbal Responses: Reinforcements needed, State content correctly Electronic Signature(s) Signed: 07/05/2020 5:29:15 PM By: [name] RN, BSN Entered By: [name] on 07/05/2020 10:27:29 -------------------------------------------------------------------------------- Wound Assessment Details Patient Name: Date of Service: [name], [place] [name] 07/05/2020 9:15 A M Medical Record Number: [id number] Patient Account Number: [id number] Date of Birth/Sex: Treating RN: [birth year] (69 y.o. [name] Primary Care Vernel Donlan: [name] Other Clinician: Referring Eliam Snapp: Treating Darika Ildefonso/Extender: Christella Hartigan in Treatment: 21 Wound  Status Wound Number: 19 Primary Diabetic Wound/Ulcer of the Lower Extremity Etiology: Wound Location: Right T Third oe Wound Open Wounding Event: Footwear Injury Status: Date Acquired: 06/25/2020 Comorbid Cataracts, Anemia, Asthma, Hypertension, Type II Diabetes, End Weeks Of Treatment: 1 History: Stage Renal Disease, Osteomyelitis, Neuropathy Clustered Wound: No Wound Measurements Length: (cm) 0.7 Width: (cm) 1 Depth: (cm) 0.1 Area: (cm) 0.55 Volume: (cm) 0.055 % Reduction in Area: -100% % Reduction in Volume: -103.7% Epithelialization: None Tunneling: No Undermining: No Wound Description Classification: Grade 2 Wound Margin: Distinct, outline attached Exudate Amount: Medium Exudate Type: Serosanguineous Exudate Color: red, brown Foul Odor After Cleansing: No Slough/Fibrino No Wound Bed Granulation Amount: Small (1-33%) Exposed Structure Granulation Quality: Red Fascia Exposed: No Necrotic Amount: Large (67-100%) Fat Layer (Subcutaneous Tissue) Exposed: Yes Necrotic Quality: Eschar Tendon Exposed: No Muscle Exposed: No Joint Exposed: No Bone Exposed: No Treatment Notes Wound #19 (Toe Third) Wound Laterality: Right Cleanser Peri-Wound Care Topical Primary Dressing Santyl Ointment Discharge Instruction: Apply nickel thick amount to wound bed as instructed Secondary Dressing Woven Gauze Sponges 2x2 in Discharge Instruction: Apply over primary dressing as directed. Secured With Conforming Stretch Gauze Bandage, Sterile 2x75 (in/in) Discharge Instruction: Secure with stretch gauze as directed. Compression Wrap Compression Stockings Add-Ons Electronic Signature(s) Signed: 07/05/2020  5:05:31 PM By: [name] RN Entered By: [name] on 07/05/2020 09:39:32 -------------------------------------------------------------------------------- Wound Assessment Details Patient Name: Date of Service: [facility] [name], [name] 07/05/2020 9:15 A M Medical Record  Number: [id number] Patient Account Number: [id number] Date of Birth/Sex: Treating RN: Aug 16, 1950 (69 y.o. [name] Primary Care Taedyn Glasscock: [name] Other Clinician: Referring Gabreal Worton: Treating Naresh Althaus/Extender: Christella Hartigan in Treatment: 21 Wound Status Wound Number: 8 Primary Diabetic Wound/Ulcer of the Lower Extremity Etiology: Wound Location: Left T Third oe Wound Open Wounding Event: Blister Status: Date Acquired: 02/23/2020 Comorbid Cataracts, Anemia, Asthma, Hypertension, Type II Diabetes, End Weeks Of Treatment: 19 History: Stage Renal Disease, Osteomyelitis, Neuropathy Clustered Wound: No Wound Measurements Length: (cm) 0.8 Width: (cm) 0.9 Depth: (cm) 0.1 Area: (cm) 0.565 Volume: (cm) 0.057 % Reduction in Area: -300.7% % Reduction in Volume: -307.1% Epithelialization: Medium (34-66%) Tunneling: No Undermining: No Wound Description Classification: Grade 2 Wound Margin: Flat and Intact Exudate Amount: Medium Exudate Type: Serosanguineous Exudate Color: red, brown Foul Odor After Cleansing: No Slough/Fibrino Yes Wound Bed Granulation Amount: Small (1-33%) Exposed Structure Granulation Quality: Red Fascia Exposed: No Necrotic Amount: Large (67-100%) Fat Layer (Subcutaneous Tissue) Exposed: Yes Necrotic Quality: Adherent Slough Tendon Exposed: No Muscle Exposed: No Joint Exposed: No Bone Exposed: No Treatment Notes Wound #8 (Toe Third) Wound Laterality: Left Cleanser Peri-Wound Care Topical Primary Dressing Santyl Ointment Discharge Instruction: Apply nickel thick amount to wound bed as instructed Secondary Dressing Woven Gauze Sponges 2x2 in Discharge Instruction: Apply over primary dressing as directed. Secured With Conforming Stretch Gauze Bandage, Sterile 2x75 (in/in) Discharge Instruction: Secure with stretch gauze as directed. Compression Wrap Compression Stockings Add-Ons Electronic  Signature(s) Signed: 07/05/2020 5:05:31 PM By: [name] RN Entered By: [name] on 07/05/2020 09:40:13 -------------------------------------------------------------------------------- Vitals Details Patient Name: Date of Service: [name], [place] [name] 07/05/2020 9:15 A M Medical Record Number: [id number] Patient Account Number: [id number] Date of Birth/Sex: Treating RN: 09-01-1950 (69 y.o. [name] Primary Care Lela Murfin: [name] Other Clinician: Referring Letrice Pollok: Treating Kerry Chisolm/Extender: Christella Hartigan in Treatment: 21 Vital Signs Time Taken: 09:34 Temperature (F): 98 Height (in): 67 Pulse (bpm): 91 Weight (lbs): 202 Respiratory Rate (breaths/min): 18 Body Mass Index (BMI): 31.6 Blood Pressure (mmHg): 131/78 Reference Range: 80 - 120 mg / dl Electronic Signature(s) Signed: 07/05/2020 5:05:31 PM By: [name] RN Entered By: [name] on 07/05/2020 09:34:35

## 2020-07-05 NOTE — Progress Notes (Addendum)
ELEORA, SUTHERLAND ([id number]) Visit Report for 07/05/2020 Chief Complaint Document Details Patient Name: Date of Service: [name], [name] 07/05/2020 9:15 A M Medical Record Number: [id number] Patient Account Number: [id number] Date of Birth/Sex: Treating RN: 10/19/1950 (69 y.o. [name] Primary Care Provider: [name] Other Clinician: Referring Provider: Treating Provider/Extender: Christella Hartigan in Treatment: 21 Information Obtained from: Patient Chief Complaint Multiple LE Ulcers Electronic Signature(s) Signed: 07/05/2020 9:54:12 AM By: [name] PA-C Entered By: [name] on 07/05/2020 09:54:12 -------------------------------------------------------------------------------- Debridement Details Patient Name: Date of Service: [facility] [name], [place] [name] 07/05/2020 9:15 A M Medical Record Number: [id number] Patient Account Number: [id number] Date of Birth/Sex: Treating RN: 05/11/1951 (69 y.o. [name] Primary Care Provider: [name] Other Clinician: Referring Provider: Treating Provider/Extender: Christella Hartigan in Treatment: 21 Debridement Performed for Assessment: Wound #8 Left T Third oe Performed By: Physician [name], PA Debridement Type: Debridement Severity of Tissue Pre Debridement: Fat layer exposed Level of Consciousness (Pre-procedure): Awake and Alert Pre-procedure Verification/Time Out Yes - 10:25 Taken: Start Time: 10:27 Pain Control: Lidocaine 4% T opical Solution T Area Debrided (L x W): otal 0.8 (cm) x 0.9 (cm) = 0.72 (cm) Tissue and other material debrided: Viable, Non-Viable, Slough, Subcutaneous, Slough Level: Skin/Subcutaneous Tissue Debridement Description: Excisional Instrument: Curette Bleeding: Minimum Hemostasis Achieved: Pressure End Time: 10:30 Procedural Pain: 0 Post Procedural Pain: 0 Response to Treatment: Procedure was tolerated well Level of  Consciousness (Post- Awake and Alert procedure): Post Debridement Measurements of Total Wound Length: (cm) 0.8 Width: (cm) 0.9 Depth: (cm) 0.2 Volume: (cm) 0.113 Character of Wound/Ulcer Post Debridement: Improved Severity of Tissue Post Debridement: Fat layer exposed Post Procedure Diagnosis Same as Pre-procedure Electronic Signature(s) Signed: 07/05/2020 1:05:14 PM By: [name] PA-C Signed: 07/05/2020 5:29:15 PM By: [name] RN, BSN Entered By: [name] on 07/05/2020 10:29:47 -------------------------------------------------------------------------------- Debridement Details Patient Name: Date of Service: [name], [place] [name] 07/05/2020 9:15 A M Medical Record Number: [id number] Patient Account Number: [id number] Date of Birth/Sex: Treating RN: 26-Dec-1950 (69 y.o. [name] Primary Care Provider: [name] Other Clinician: Referring Provider: Treating Provider/Extender: Christella Hartigan in Treatment: 21 Debridement Performed for Assessment: Wound #19 Right T Third oe Performed By: Physician [name], PA Debridement Type: Debridement Severity of Tissue Pre Debridement: Fat layer exposed Level of Consciousness (Pre-procedure): Awake and Alert Pre-procedure Verification/Time Out Yes - 10:25 Taken: Start Time: 10:27 Pain Control: Lidocaine 4% T opical Solution T Area Debrided (L x W): otal 0.7 (cm) x 1 (cm) = 0.7 (cm) Tissue and other material debrided: Viable, Non-Viable, Slough, Subcutaneous, Slough Level: Skin/Subcutaneous Tissue Debridement Description: Excisional Instrument: Curette Bleeding: Minimum Hemostasis Achieved: Pressure End Time: 10:30 Procedural Pain: 0 Post Procedural Pain: 0 Response to Treatment: Procedure was tolerated well Level of Consciousness (Post- Awake and Alert procedure): Post Debridement Measurements of Total Wound Length: (cm) 0.7 Width: (cm) 1 Depth: (cm) 0.1 Volume:  (cm) 0.055 Character of Wound/Ulcer Post Debridement: Improved Severity of Tissue Post Debridement: Fat layer exposed Post Procedure Diagnosis Same as Pre-procedure Electronic Signature(s) Signed: 07/05/2020 1:05:14 PM By: [name] PA-C Signed: 07/05/2020 5:29:15 PM By: [name] RN, BSN Entered By: [name] on 07/05/2020 10:30:29 -------------------------------------------------------------------------------- HPI Details Patient Name: Date of Service: [name], [place] [name] 07/05/2020 9:15 A M Medical Record Number: [id number] Patient Account Number: [id number] Date of Birth/Sex: Treating RN: [birth year] (69 y.o.  [name] Primary Care Provider: [name] Other Clinician: Referring Provider: Treating Provider/Extender: Christella Hartigan in Treatment: 21 History of Present Illness HPI Description: Evaluate7/21/2021 today patient presents for evaluation of ulcerations on her legs which she tells me tend to come and go as far as small blistering locations and sometimes are better than other times. Right now she tells me that this is actually doing a little bit better but nonetheless will not completely go away. She has ulcerations bilaterally on her lower extremities. The right is worse than the left. 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 The patient has no evidence of active infection at this time. She however appears to have proficient blood flow into the extremities and I think would tolerate a 3 layer compression wrap she was noncompressible on arterial studies. Nonetheless there was no obvious signs of occlusion. 02/16/2020 on evaluation today patient appears to be doing much better in regard to her wounds in general. On the past week she has made significant improvements which is great news there is no signs of active  infection at this time. No fevers, chills, nausea, vomiting, or diarrhea. 02/23/2020 on evaluation today patient appears to be doing well in regard to her lower extremity ulcers and ankle ulcers. Overall I feel like she is making great progress and in general I am extremely pleased with how things stand. There is no sign of active infection at this time which is also good news. She will require slight debridement around the right ankle region. 03/02/20-Patient returns at 1 week, has few new areas open on the left leg, especially 1 healed area on the medial ankle that has slight small open area now, to anterior leg areas that started out as blisters that are open now. She was using juxta lite compression to the left, she used Band-Aid to cover the vesicle that had ruptured on the left anterior leg, we are using 3 layer compression on the right. We are using silver alginate to the toe wound on the left. 03/08/2020 upon evaluation today patient unfortunately is doing much worse in regard to her right leg although her left leg looks like is doing better. Again we have taken her compression wraps off last week since she was doing well and transitioned her to her Velcro compression wrap. However apparently the patient is stating this was not put on here in the clinic and she never put it on at home. With that being said unfortunately she developed swelling and opened up new wounds which are present today she has quite a few of them in fact. 03/15/2020 on evaluation today patient appears to be doing well with regard to her wounds on the lower extremities bilaterally. Fortunately there is no signs of active infection at this time. No fever chills noted. Overall I am actually very pleased with where things stand currently. 03/22/2020 on evaluation today patient appears to be doing fairly well in regard to her wounds in general. She just has really one area left on her toe everything else is healed on her legs. She  does have her Velcro compression wraps which I think are appropriate at this point. Fortunately there is no signs of active infection at this time. No fevers, chills, nausea, vomiting, or diarrhea. 03/29/2020 on evaluation today patient appears to be doing poorly in regard to her right leg which is pretty much  completely reopened compared to last week. She states it was okay until Saturday when she woke up she had blisters. With that being said it appears that this is likely stemming from the fact that the patient is apparently though I just found this out today going to sleep in her bed with her legs up on the bed with her but throughout the night she tends to end up with her legs hanging off the side or bottom of the bed on the floor. With that being said I think that this likely is happening much too long and then she has significant swelling the builds up as she sleeps. Obviously I think this is the reasoning behind what we are seeing currently. 04/05/2020 on evaluation today patient appears to be doing quite well with regard to her leg ulcers at this point. Fortunately there is no signs of anything significantly open again which is great news nonetheless I am to continue wrapping her for at least a couple of weeks due to the fact that she seems to continually reopen as soon as we unwrapped her. 04/12/2020 upon evaluation today patient appears to be doing excellent in regard to her leg ulcers all appear to be improving which is great news. She is also doing better in regard to the toe the infection seems to be under better control and the Santyl is helping to clean this up. Overall very pleased much more so than what I saw last week. 04/19/2020 on evaluation today patient appears to be doing well with regard to her leg ulcers which are showing no signs of opening at this point. Her toe is also showing signs of improvement she is about done with her antibiotic. Overall I feel like things are doing quite  well. 04/26/2020 upon evaluation today patient appears to be doing unfortunately a little bit worse compared to previous. She has reopened wounds on the right anterior lower extremity. This is unfortunate as we were hoping that this would really maintain healing. That does not appear to be the case. I think with him the need to see about making a referral for venous studies and provider evaluation with [facility] imaging with the interventional radiologist. 08/11/2019 upon evaluation today patient appears to be doing well in general in regard to her legs. Her right leg she does have a new wound that opened since last week. On the left leg everything appears to be healed she does still have the opening on her toe which we are managing but again this does not appear to be any worse. She just needs to get this to feeling and cover over. There is no evidence of infection which is at least great news. 05/17/2020 upon evaluation today patient appears to be doing decently well in regard to her leg ulcers this is good news. She does have still an opening on the left second toe. She has been let this dry out that not really what we want to see currently. For that reason I discussed with her again the fact that she needs to keep this covered and not allow it to dry out in order to allow for appropriate healing. She voiced understanding and states she did not know that. 05/24/2020 upon evaluation today patient's legs both appear to be still be completely healed which is great news. We are now going to transition into having her utilize her juxta lite compression wraps on the bilateral lower extremities. She is definitely ready for this. Her toe also seems to  be doing better. 05/31/2020 on evaluation today patient appears to be doing well with regard to her legs. There is nothing open at this point. Her toe still has some slough noted and get a clean that off today by way of sharp debridement other than that I feel  like the collagen is probably still her best bet in that regard. 06/07/2020 upon evaluation today patient appears to be doing well with regard to her left leg and her toe ulcer the toe seems to be doing better the leg is doing okay. The right leg unfortunately she has new wounds I think her shoes are rubbing on the side of her ankle which is what is causing part of this issue. Fortunately there is no signs of active infection at this time which is great news. 06/14/2020 upon evaluation today patient appears to be doing well today in regard to most of her wounds. Everything seems to be showing some signs of improvement or else healing. Fortunately there is no signs of active infection at this time. No fevers, chills, nausea, vomiting, or diarrhea. She has been using a silver collagen for her toe ulcer. Damage12/07/2019 on evaluation today patient appears to be doing okay in regard to her right medial ankle ulcer in her left toe ulcer. Both are a little bit better today visually but again she still is not doing as well as I like to see. She still continues to wear the worn-out 69 year old crocs which are not good for her to be honest I think it is causing and preventing healing. Fortunately there is no signs of active infection at this time. No fevers, chills, nausea, vomiting, or diarrhea. 06/28/20 upon evaluation today patient appears to be doing poorly in regard to her second toes bilaterally. These both appear to be rubbing on her shoes these are shoes that have asked him to get rid of every visit since have been seeing her. I had a much more lengthy conversation with her about it last week she told me she was going to get different shoes to wear I get she comes in with the same shoes on today. Nonetheless I think this is part of her problem. Obviously the left second toe appears to be infected at this point. The right necessarily see evidence of this but nonetheless is definitely been rubbing where  it sticks up. 07/05/2020 on evaluation today patient's toe ulcers appear to be doing really about the same the left toe may be a little better in the right toe a little worse but overall not much change. Fortunately there is no signs of active infection at this time. No fevers, chills, nausea, vomiting, or diarrhea. Electronic Signature(s) Signed: 07/05/2020 10:32:20 AM By: [name] PA-C Entered By: [name] on 07/05/2020 10:32:19 -------------------------------------------------------------------------------- Physical Exam Details Patient Name: Date of Service: [facility] [name], [place] [name] 07/05/2020 9:15 A M Medical Record Number: [id number] Patient Account Number: [id number] Date of Birth/Sex: Treating RN: September 06, 1950 (69 y.o. [name] Primary Care Provider: [name] Other Clinician: Referring Provider: Treating Provider/Extender: Christella Hartigan in Treatment: 25 Constitutional Well-nourished and well-hydrated in no acute distress. Respiratory normal breathing without difficulty. Psychiatric this patient is able to make decisions and demonstrates good insight into disease process. Alert and Oriented x 3. pleasant and cooperative. Notes Patient's wound bed actually showed signs of good granulation at this time. There does not appear to be any evidence of active infection which is great news. Overall very  pleased with where things stand. Though she still needs to be very careful with regard to her toes. Obviously I think she needs to get new shoes have mentioned this multiple times. Unfortunately she just continues to have trouble overall with regard to her wounds worsening I think it is rubbing on the shoe I think that is really what she needs to be focused on at this point. Electronic Signature(s) Signed: 07/05/2020 10:35:18 AM By: [name] PA-C Entered By: [name] on 07/05/2020  10:35:17 -------------------------------------------------------------------------------- Physician Orders Details Patient Name: Date of Service: [facility] [name], [place] [name] 07/05/2020 9:15 A M Medical Record Number: [id number] Patient Account Number: [id number] Date of Birth/Sex: Treating RN: 1950-09-17 (69 y.o. [name] Primary Care Provider: [name] Other Clinician: Referring Provider: Treating Provider/Extender: Christella Hartigan in Treatment: 21 Verbal / Phone Orders: No Diagnosis Coding ICD-10 Coding Code Description I87.2 Venous insufficiency (chronic) (peripheral) E11.621 Type 2 diabetes mellitus with foot ulcer L97.522 Non-pressure chronic ulcer of other part of left foot with fat layer exposed N18.6 End stage renal disease Z99.2 Dependence on renal dialysis I10 Essential (primary) hypertension Follow-up Appointments Return Appointment in 1 week. Bathing/ Shower/ Hygiene May shower and wash wound with soap and water. Edema Control - Lymphedema / SCD / Other Bilateral Lower Extremities Elevate legs to the level of the heart or above for 30 minutes daily and/or when sitting, a frequency of: Avoid standing for long periods of time. Exercise regularly Moisturize legs daily. Compression stocking or Garment 20-30 mm/Hg pressure to: - juxtalites to both legs daily, apply in the morning and remove at night Wound Treatment Wound #19 - T Third oe Wound Laterality: Right Prim Dressing: Santyl Ointment 1 x Per Day ary Discharge Instructions: Apply nickel thick amount to wound bed as instructed Secondary Dressing: Woven Gauze Sponges 2x2 in 1 x Per Day Discharge Instructions: Apply over primary dressing as directed. Secured With: Child psychotherapist, Sterile 2x75 (in/in) 1 x Per Day Discharge Instructions: Secure with stretch gauze as directed. Wound #8 - T Third oe Wound Laterality: Left Prim Dressing: Santyl Ointment 1 x Per  Day ary Discharge Instructions: Apply nickel thick amount to wound bed as instructed Secondary Dressing: Woven Gauze Sponges 2x2 in 1 x Per Day Discharge Instructions: Apply over primary dressing as directed. Secured With: Child psychotherapist, Sterile 2x75 (in/in) 1 x Per Day Discharge Instructions: Secure with stretch gauze as directed. Electronic Signature(s) Signed: 07/05/2020 1:05:14 PM By: [name] PA-C Signed: 07/05/2020 5:29:15 PM By: [name] RN, BSN Entered By: [name] on 07/05/2020 10:32:37 -------------------------------------------------------------------------------- Problem List Details Patient Name: Date of Service: [facility] [name], [place] [name] 07/05/2020 9:15 A M Medical Record Number: [id number] Patient Account Number: [id number] Date of Birth/Sex: Treating RN: 01/16/1951 (69 y.o. Martyn Malay, [place] Primary Care Provider: [name] Other Clinician: Referring Provider: Treating Provider/Extender: Christella Hartigan in Treatment: 21 Active Problems ICD-10 Encounter Code Description Active Date MDM Diagnosis I87.2 Venous insufficiency (chronic) (peripheral) 02/09/2020 No Yes E11.621 Type 2 diabetes mellitus with foot ulcer 02/09/2020 No Yes L97.522 Non-pressure chronic ulcer of other part of left foot with fat layer exposed 05/31/2020 No Yes L97.512 Non-pressure chronic ulcer of other part of right foot with fat layer exposed 07/05/2020 No Yes N18.6 End stage renal disease 02/09/2020 No Yes Z99.2 Dependence on renal dialysis 02/09/2020 No Yes I10 Essential (primary) hypertension 02/09/2020 No Yes Inactive Problems Resolved Problems ICD-10 Code Description  Active Date Resolved Date L97.822 Non-pressure chronic ulcer of other part of left lower leg with fat layer exposed 02/09/2020 02/09/2020 L97.312 Non-pressure chronic ulcer of right ankle with fat layer exposed 02/09/2020 02/09/2020 L97.812 Non-pressure chronic ulcer of  other part of right lower leg with fat layer exposed 02/09/2020 02/09/2020 L97.512 Non-pressure chronic ulcer of other part of right foot with fat layer exposed 02/09/2020 02/09/2020 Electronic Signature(s) Signed: 07/05/2020 1:04:01 PM By: [name] PA-C Previous Signature: 07/05/2020 9:54:01 AM Version By: [name] PA-C Entered By: [name] on 07/05/2020 13:04:00 -------------------------------------------------------------------------------- Progress Note Details Patient Name: Date of Service: [name], [place] [name] 07/05/2020 9:15 A M Medical Record Number: [id number] Patient Account Number: [id number] Date of Birth/Sex: Treating RN: 12/15/1950 (69 y.o. [name] Primary Care Provider: [name] Other Clinician: Referring Provider: Treating Provider/Extender: Christella Hartigan in Treatment: 21 Subjective Chief Complaint Information obtained from Patient Multiple LE Ulcers History of Present Illness (HPI) Evaluate7/21/2021 today patient presents for evaluation of ulcerations on her legs which she tells me tend to come and go as far as small blistering locations and sometimes are better than other times. Right now she tells me that this is actually doing a little bit better but nonetheless will not completely go away. She has ulcerations bilaterally on her lower extremities. The right is worse than the left. 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 The patient has no evidence of active infection at this time. She however appears to have proficient blood flow into the extremities and I think would tolerate a 3 layer compression wrap she was noncompressible on arterial studies. Nonetheless there was no obvious signs of occlusion. 02/16/2020 on evaluation today patient appears to be doing much better in regard to her wounds in  general. On the past week she has made significant improvements which is great news there is no signs of active infection at this time. No fevers, chills, nausea, vomiting, or diarrhea. 02/23/2020 on evaluation today patient appears to be doing well in regard to her lower extremity ulcers and ankle ulcers. Overall I feel like she is making great progress and in general I am extremely pleased with how things stand. There is no sign of active infection at this time which is also good news. She will require slight debridement around the right ankle region. 03/02/20-Patient returns at 1 week, has few new areas open on the left leg, especially 1 healed area on the medial ankle that has slight small open area now, to anterior leg areas that started out as blisters that are open now. She was using juxta lite compression to the left, she used Band-Aid to cover the vesicle that had ruptured on the left anterior leg, we are using 3 layer compression on the right. We are using silver alginate to the toe wound on the left. 03/08/2020 upon evaluation today patient unfortunately is doing much worse in regard to her right leg although her left leg looks like is doing better. Again we have taken her compression wraps off last week since she was doing well and transitioned her to her Velcro compression wrap. However apparently the patient is stating this was not put on here in the clinic and she never put it on at home. With that being said unfortunately she developed swelling and opened up new wounds which are present today she has quite  a few of them in fact. 03/15/2020 on evaluation today patient appears to be doing well with regard to her wounds on the lower extremities bilaterally. Fortunately there is no signs of active infection at this time. No fever chills noted. Overall I am actually very pleased with where things stand currently. 03/22/2020 on evaluation today patient appears to be doing fairly well in regard to  her wounds in general. She just has really one area left on her toe everything else is healed on her legs. She does have her Velcro compression wraps which I think are appropriate at this point. Fortunately there is no signs of active infection at this time. No fevers, chills, nausea, vomiting, or diarrhea. 03/29/2020 on evaluation today patient appears to be doing poorly in regard to her right leg which is pretty much completely reopened compared to last week. She states it was okay until Saturday when she woke up she had blisters. With that being said it appears that this is likely stemming from the fact that the patient is apparently though I just found this out today going to sleep in her bed with her legs up on the bed with her but throughout the night she tends to end up with her legs hanging off the side or bottom of the bed on the floor. With that being said I think that this likely is happening much too long and then she has significant swelling the builds up as she sleeps. Obviously I think this is the reasoning behind what we are seeing currently. 04/05/2020 on evaluation today patient appears to be doing quite well with regard to her leg ulcers at this point. Fortunately there is no signs of anything significantly open again which is great news nonetheless I am to continue wrapping her for at least a couple of weeks due to the fact that she seems to continually reopen as soon as we unwrapped her. 04/12/2020 upon evaluation today patient appears to be doing excellent in regard to her leg ulcers all appear to be improving which is great news. She is also doing better in regard to the toe the infection seems to be under better control and the Santyl is helping to clean this up. Overall very pleased much more so than what I saw last week. 04/19/2020 on evaluation today patient appears to be doing well with regard to her leg ulcers which are showing no signs of opening at this point. Her toe is  also showing signs of improvement she is about done with her antibiotic. Overall I feel like things are doing quite well. 04/26/2020 upon evaluation today patient appears to be doing unfortunately a little bit worse compared to previous. She has reopened wounds on the right anterior lower extremity. This is unfortunate as we were hoping that this would really maintain healing. That does not appear to be the case. I think with him the need to see about making a referral for venous studies and provider evaluation with [facility] imaging with the interventional radiologist. 08/11/2019 upon evaluation today patient appears to be doing well in general in regard to her legs. Her right leg she does have a new wound that opened since last week. On the left leg everything appears to be healed she does still have the opening on her toe which we are managing but again this does not appear to be any worse. She just needs to get this to feeling and cover over. There is no evidence of infection which is at  least great news. 05/17/2020 upon evaluation today patient appears to be doing decently well in regard to her leg ulcers this is good news. She does have still an opening on the left second toe. She has been let this dry out that not really what we want to see currently. For that reason I discussed with her again the fact that she needs to keep this covered and not allow it to dry out in order to allow for appropriate healing. She voiced understanding and states she did not know that. 05/24/2020 upon evaluation today patient's legs both appear to be still be completely healed which is great news. We are now going to transition into having her utilize her juxta lite compression wraps on the bilateral lower extremities. She is definitely ready for this. Her toe also seems to be doing better. 05/31/2020 on evaluation today patient appears to be doing well with regard to her legs. There is nothing open at this point. Her  toe still has some slough noted and get a clean that off today by way of sharp debridement other than that I feel like the collagen is probably still her best bet in that regard. 06/07/2020 upon evaluation today patient appears to be doing well with regard to her left leg and her toe ulcer the toe seems to be doing better the leg is doing okay. The right leg unfortunately she has new wounds I think her shoes are rubbing on the side of her ankle which is what is causing part of this issue. Fortunately there is no signs of active infection at this time which is great news. 06/14/2020 upon evaluation today patient appears to be doing well today in regard to most of her wounds. Everything seems to be showing some signs of improvement or else healing. Fortunately there is no signs of active infection at this time. No fevers, chills, nausea, vomiting, or diarrhea. She has been using a silver collagen for her toe ulcer. Damage12/07/2019 on evaluation today patient appears to be doing okay in regard to her right medial ankle ulcer in her left toe ulcer. Both are a little bit better today visually but again she still is not doing as well as I like to see. She still continues to wear the worn-out 69 year old crocs which are not good for her to be honest I think it is causing and preventing healing. Fortunately there is no signs of active infection at this time. No fevers, chills, nausea, vomiting, or diarrhea. 06/28/20 upon evaluation today patient appears to be doing poorly in regard to her second toes bilaterally. These both appear to be rubbing on her shoes these are shoes that have asked him to get rid of every visit since have been seeing her. I had a much more lengthy conversation with her about it last week she told me she was going to get different shoes to wear I get she comes in with the same shoes on today. Nonetheless I think this is part of her problem. Obviously the left second toe appears to be  infected at this point. The right necessarily see evidence of this but nonetheless is definitely been rubbing where it sticks up. 07/05/2020 on evaluation today patient's toe ulcers appear to be doing really about the same the left toe may be a little better in the right toe a little worse but overall not much change. Fortunately there is no signs of active infection at this time. No fevers, chills, nausea, vomiting, or diarrhea. Objective  Constitutional Well-nourished and well-hydrated in no acute distress. Vitals Time Taken: 9:34 AM, Height: 67 in, Weight: 202 lbs, BMI: 31.6, Temperature: 98 F, Pulse: 91 bpm, Respiratory Rate: 18 breaths/min, Blood Pressure: 131/78 mmHg. Respiratory normal breathing without difficulty. Psychiatric this patient is able to make decisions and demonstrates good insight into disease process. Alert and Oriented x 3. pleasant and cooperative. General Notes: Patient's wound bed actually showed signs of good granulation at this time. There does not appear to be any evidence of active infection which is great news. Overall very pleased with where things stand. Though she still needs to be very careful with regard to her toes. Obviously I think she needs to get new shoes have mentioned this multiple times. Unfortunately she just continues to have trouble overall with regard to her wounds worsening I think it is rubbing on the shoe I think that is really what she needs to be focused on at this point. Integumentary (Hair, Skin) Wound #19 status is Open. Original cause of wound was Footwear Injury. The wound is located on the Right T Third. The wound measures 0.7cm length x oe 1cm width x 0.1cm depth; 0.55cm^2 area and 0.055cm^3 volume. There is Fat Layer (Subcutaneous Tissue) exposed. There is no tunneling or undermining noted. There is a medium amount of serosanguineous drainage noted. The wound margin is distinct with the outline attached to the wound base. There is  small (1-33%) red granulation within the wound bed. There is a large (67-100%) amount of necrotic tissue within the wound bed including Eschar. Wound #8 status is Open. Original cause of wound was Blister. The wound is located on the Left T Third. The wound measures 0.8cm length x 0.9cm width x oe 0.1cm depth; 0.565cm^2 area and 0.057cm^3 volume. There is Fat Layer (Subcutaneous Tissue) exposed. There is no tunneling or undermining noted. There is a medium amount of serosanguineous drainage noted. The wound margin is flat and intact. There is small (1-33%) red granulation within the wound bed. There is a large (67-100%) amount of necrotic tissue within the wound bed including Adherent Slough. 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 There was a Excisional Skin/Subcutaneous Tissue Debridement with a total area of 0.7 sq cm performed by [name], PA. With the following instrument(s): Curette to remove Viable and Non-Viable tissue/material. Material removed includes Subcutaneous Tissue and Slough and after achieving pain control using Lidocaine 4% T opical Solution. No specimens were taken. A time out was conducted at 10:25, prior to the start of the procedure. A Minimum amount of bleeding was controlled with Pressure. The procedure was tolerated well with a pain level of 0 throughout and a pain level of 0 following the procedure. Post Debridement Measurements: 0.7cm length x 1cm  width x 0.1cm depth; 0.055cm^3 volume. Character of Wound/Ulcer Post Debridement is improved. Severity of Tissue Post Debridement is: Fat layer exposed. Post procedure Diagnosis Wound #19: Same as Pre-Procedure Wound #8 Pre-procedure diagnosis of Wound #8 is a Diabetic Wound/Ulcer of the Lower Extremity located on the Left T Third .  Severity of Tissue Pre Debridement is: oe Fat layer exposed. There was a Excisional Skin/Subcutaneous Tissue Debridement with a total area of 0.72 sq cm performed by [name], PA. With the following instrument(s): Curette to remove Viable and Non-Viable tissue/material. Material removed includes Subcutaneous Tissue and Slough and after achieving pain control using Lidocaine 4% Topical Solution. No specimens were taken. A time out was conducted at 10:25, prior to the start of the procedure. A Minimum amount of bleeding was controlled with Pressure. The procedure was tolerated well with a pain level of 0 throughout and a pain level of 0 following the procedure. Post Debridement Measurements: 0.8cm length x 0.9cm width x 0.2cm depth; 0.113cm^3 volume. Character of Wound/Ulcer Post Debridement is improved. Severity of Tissue Post Debridement is: Fat layer exposed. Post procedure Diagnosis Wound #8: Same as Pre-Procedure Plan Follow-up Appointments: Return Appointment in 1 week. Bathing/ Shower/ Hygiene: May shower and wash wound with soap and water. Edema Control - Lymphedema / SCD / Other: Elevate legs to the level of the heart or above for 30 minutes daily and/or when sitting, a frequency of: Avoid standing for long periods of time. Exercise regularly Moisturize legs daily. Compression stocking or Garment 20-30 mm/Hg pressure to: - juxtalites to both legs daily, apply in the morning and remove at night WOUND #19: - T Third Wound Laterality: Right oe Prim Dressing: Santyl Ointment 1 x Per Day/ ary Discharge Instructions: Apply nickel thick amount to wound  bed as instructed Secondary Dressing: Woven Gauze Sponges 2x2 in 1 x Per Day/ Discharge Instructions: Apply over primary dressing as directed. Secured With: Child psychotherapist, Sterile 2x75 (in/in) 1 x Per Day/ Discharge Instructions: Secure with stretch gauze as directed. WOUND #8: - T Third Wound Laterality: Left oe Prim Dressing: Santyl Ointment 1 x Per Day/ ary Discharge Instructions: Apply nickel thick amount to wound bed as instructed Secondary Dressing: Woven Gauze Sponges 2x2 in 1 x Per Day/ Discharge Instructions: Apply over primary dressing as directed. Secured With: Child psychotherapist, Sterile 2x75 (in/in) 1 x Per Day/ Discharge Instructions: Secure with stretch gauze as directed. 1 would recommend currently that we go ahead and continue with the wound care measures as before with regard to the Santyl to the toes. I think that will help clean things appear. 2. I am also can recommend the patient needs to as soon as possible obtain new shoes I think the one she currently has a causing trauma to the toe areas. 3. I am also can recommend at this time the patient continue to monitor for any signs of active infection locally or systemically right now feel like the Bactrim is doing a good job I did review her culture results and it did show that she had a infection with Staph aureus not MRSA sensitive to the Bactrim that I had given her. We will see patient back for reevaluation in 1 week here in the clinic. If anything worsens or changes patient will contact our office for additional recommendations. Electronic Signature(s) Signed: 07/05/2020 1:04:24 PM By: [name] PA-C Previous Signature: 07/05/2020 10:35:48 AM Version By: [name] PA-C Entered By: [name] on 07/05/2020 13:04:23 -------------------------------------------------------------------------------- SuperBill Details Patient Name: Date of Service: [name], [place] [name]  07/05/2020 Medical Record Number: [id number] Patient Account Number: [id number] Date of Birth/Sex: Treating RN: 1951-04-25 (69 y.o. [name] Primary Care Provider: [name] Other Clinician: Referring Provider: Treating Provider/Extender: Susanne Borders, Junie Spencer  in Treatment: 21 Diagnosis Coding ICD-10 Codes Code Description I87.2 Venous insufficiency (chronic) (peripheral) E11.621 Type 2 diabetes mellitus with foot ulcer L97.522 Non-pressure chronic ulcer of other part of left foot with fat layer exposed L97.512 Non-pressure chronic ulcer of other part of right foot with fat layer exposed N18.6 End stage renal disease Z99.2 Dependence on renal dialysis I10 Essential (primary) hypertension Facility Procedures CPT4 Code: [id number] Description: 98022 - DEB SUBQ TISSUE 20 SQ CM/< ICD-10 Diagnosis Description L97.522 Non-pressure chronic ulcer of other part of left foot with fat layer exposed L97.512 Non-pressure chronic ulcer of other part of right foot with fat layer exposed Modifier: Quantity: 1 Physician Procedures : CPT4 Code Description Modifier [id number] - WC PHYS LEVEL 4 - EST PT 25 ICD-10 Diagnosis Description I87.2 Venous insufficiency (chronic) (peripheral) E11.621 Type 2 diabetes mellitus with foot ulcer L97.522 Non-pressure chronic ulcer of other part  of left foot with fat layer exposed L97.512 Non-pressure chronic ulcer of other part of right foot with fat layer exposed Quantity: 1 : [id number] - WC PHYS SUBQ TISS 20 SQ CM 1 ICD-10 Diagnosis Description L97.522 Non-pressure chronic ulcer of other part of left foot with fat layer exposed L97.512 Non-pressure chronic ulcer of other part of right foot with fat layer exposed Quantity: Electronic Signature(s) Signed: 07/05/2020 1:04:52 PM By: [name] PA-C Previous Signature: 07/05/2020 10:36:16 AM Version By: [name] PA-C Entered By: [name] on 07/05/2020 13:04:51

## 2020-07-06 ENCOUNTER — Telehealth: Payer: Self-pay

## 2020-07-06 DIAGNOSIS — D631 Anemia in chronic kidney disease: Secondary | ICD-10-CM | POA: Diagnosis not present

## 2020-07-06 DIAGNOSIS — D689 Coagulation defect, unspecified: Secondary | ICD-10-CM | POA: Diagnosis not present

## 2020-07-06 DIAGNOSIS — Z992 Dependence on renal dialysis: Secondary | ICD-10-CM | POA: Diagnosis not present

## 2020-07-06 DIAGNOSIS — N186 End stage renal disease: Secondary | ICD-10-CM | POA: Diagnosis not present

## 2020-07-06 DIAGNOSIS — N2581 Secondary hyperparathyroidism of renal origin: Secondary | ICD-10-CM | POA: Diagnosis not present

## 2020-07-06 DIAGNOSIS — D509 Iron deficiency anemia, unspecified: Secondary | ICD-10-CM | POA: Diagnosis not present

## 2020-07-06 NOTE — Telephone Encounter (Signed)
Scheduled patient with appointment to see Provider on 12/21

## 2020-07-08 DIAGNOSIS — N186 End stage renal disease: Secondary | ICD-10-CM | POA: Diagnosis not present

## 2020-07-08 DIAGNOSIS — D509 Iron deficiency anemia, unspecified: Secondary | ICD-10-CM | POA: Diagnosis not present

## 2020-07-08 DIAGNOSIS — N2581 Secondary hyperparathyroidism of renal origin: Secondary | ICD-10-CM | POA: Diagnosis not present

## 2020-07-08 DIAGNOSIS — D689 Coagulation defect, unspecified: Secondary | ICD-10-CM | POA: Diagnosis not present

## 2020-07-08 DIAGNOSIS — D631 Anemia in chronic kidney disease: Secondary | ICD-10-CM | POA: Diagnosis not present

## 2020-07-08 DIAGNOSIS — Z992 Dependence on renal dialysis: Secondary | ICD-10-CM | POA: Diagnosis not present

## 2020-07-11 ENCOUNTER — Ambulatory Visit: Payer: Self-pay | Admitting: Family Medicine

## 2020-07-12 ENCOUNTER — Encounter (INDEPENDENT_AMBULATORY_CARE_PROVIDER_SITE_OTHER): Payer: Medicare Other | Admitting: Ophthalmology

## 2020-07-13 ENCOUNTER — Encounter (HOSPITAL_BASED_OUTPATIENT_CLINIC_OR_DEPARTMENT_OTHER): Payer: Medicare Other | Admitting: Internal Medicine

## 2020-07-13 DIAGNOSIS — N2581 Secondary hyperparathyroidism of renal origin: Secondary | ICD-10-CM | POA: Diagnosis not present

## 2020-07-13 DIAGNOSIS — Z992 Dependence on renal dialysis: Secondary | ICD-10-CM | POA: Diagnosis not present

## 2020-07-13 DIAGNOSIS — D631 Anemia in chronic kidney disease: Secondary | ICD-10-CM | POA: Diagnosis not present

## 2020-07-13 DIAGNOSIS — D509 Iron deficiency anemia, unspecified: Secondary | ICD-10-CM | POA: Diagnosis not present

## 2020-07-13 DIAGNOSIS — N186 End stage renal disease: Secondary | ICD-10-CM | POA: Diagnosis not present

## 2020-07-13 DIAGNOSIS — D689 Coagulation defect, unspecified: Secondary | ICD-10-CM | POA: Diagnosis not present

## 2020-07-13 NOTE — Progress Notes (Signed)
[facility] Note  07/21/2020     CHIEF COMPLAINT Patient presents for Retina Follow Up   HISTORY OF PRESENT ILLNESS: [name] is a 69 y.o. female who presents to the clinic today for:   HPI    Retina Follow Up    Patient presents with  Diabetic Retinopathy.  In both eyes.  This started weeks ago.  Severity is mild.  Duration of 7 weeks.  Since onset it is stable.  I, the attending physician,  performed the HPI with the patient and updated documentation appropriately.          Comments    7 week retina follow up for NPDR ou. Patient states no vision changes. Patient blood sugar on Tuesday was in 300's.       Last edited by [name], MD on 07/21/2020  4:29 PM. (History)    Pt states she feels like her vision is doing better  Referring physician: [name], MD [place],  [place] [address]  HISTORICAL INFORMATION:   Selected notes from the MEDICAL RECORD NUMBER Referred by Dr. [name] for concern of BRVO OS LEE: 10.03.19 (C. Weaver) [BCVA: OD: 20/30 OS: 20/50] Ocular Hx-cataracts OU, HTR OU, NPDR OU, DES PMH-DM (X6I: 7.3, takes Trulicity and [place]), HTN, HLD     CURRENT MEDICATIONS: Current Outpatient Medications (Ophthalmic Drugs)  Medication Sig  . Carboxymethylcellulose Sodium (THERATEARS OP) Place 1 drop into both eyes daily as needed (dry eyes).   No current facility-administered medications for this visit. (Ophthalmic Drugs)   Current Outpatient Medications (Other)  Medication Sig  . Blood Glucose Monitoring Suppl (ONE TOUCH ULTRA 2) w/Device KIT Use to check BS BID-QID Dx:E11.9  . BREO ELLIPTA 100-25 MCG/INH AEPB Inhale 1 puff by mouth once daily (Patient taking differently: Inhale 1 puff into the lungs daily.)  . diphenhydrAMINE (BENADRYL) 25 MG tablet Take 25 mg by mouth daily as needed for allergies.  Marland Kitchen gabapentin (NEURONTIN) 300 MG capsule TAKE 1 CAPSULE BY MOUTH THREE TIMES DAILY.  REQUIRES OFFICE VISIT BEFORE ANY FURTHER REFILLS CAN BE GIVEN  . insulin degludec (TRESIBA FLEXTOUCH) 100 UNIT/ML FlexTouch Pen Inject 0.3 mLs (30 Units total) into the skin daily. Hold if fasting blood sugars are <130.  Marland Kitchen Insulin Pen Needle (LIVE BETTER PEN NEEDLES) 31G X 6 MM MISC To use with Tresiba pens daily  . Lancets (ONETOUCH ULTRASOFT) lancets Use as instructed  . metroNIDAZOLE (FLAGYL) 500 MG tablet Take 1 tablet (500 mg total) by mouth 2 (two) times daily.  . multivitamin (RENA-VIT) TABS tablet Take 1 tablet by mouth daily.  Marland Kitchen oxyCODONE-acetaminophen (PERCOCET) 5-325 MG tablet Take 1 tablet by mouth every 6 (six) hours as needed for severe pain.  Marland Kitchen PROAIR HFA 108 (90 Base) MCG/ACT inhaler INHALE 2 PUFFS BY MOUTH EVERY 6 HOURS AS NEEDED FOR WHEEZING FOR SHORTNESS OF BREATH (Patient taking differently: Inhale 2 puffs into the lungs every 6 (six) hours as needed for wheezing or shortness of breath.)  . rosuvastatin (CRESTOR) 20 MG tablet Take 1 tablet (20 mg total) by mouth daily.  Glory Rosebush ULTRA test strip USE AS DIRECTED TO MONITOR  FSBS 3 TIMES DAILY (Patient not taking: Reported on 07/21/2020)   Current Facility-Administered Medications (Other)  Medication Route  . Bevacizumab (AVASTIN) SOLN 1.25 mg Intravitreal  . Bevacizumab (AVASTIN) SOLN 1.25 mg Intravitreal  . Bevacizumab (AVASTIN) SOLN 1.25 mg Intravitreal  . Bevacizumab (AVASTIN) SOLN 1.25 mg Intravitreal  .  Bevacizumab (AVASTIN) SOLN 1.25 mg Intravitreal      REVIEW OF SYSTEMS: ROS    Positive for: Endocrine, Cardiovascular, Eyes, Respiratory   Negative for: Constitutional, Gastrointestinal, Neurological, Skin, Genitourinary, Musculoskeletal, HENT, Psychiatric, Allergic/Imm, Heme/Lymph   Last edited by Elmore Guise, COT on 07/21/2020  9:07 AM. (History)       ALLERGIES Allergies  Allergen Reactions  . Penicillins Other (See Comments)    UNSPECIFIED CHILDHOOD REACTION  Has patient had a PCN reaction causing  immediate rash, facial/tongue/throat swelling, SOB or lightheadedness with hypotension: Unknown Has patient had a PCN reaction causing severe rash involving mucus membranes or skin necrosis: Unknown Has patient had a PCN reaction that required hospitalization: Unknown Has patient had a PCN reaction occurring within the last 10 years: Unknown If all of the above answers are "NO", then may proceed with Cephalosporin use.   . Adhesive [Tape] Rash    PAST MEDICAL HISTORY Past Medical History:  Diagnosis Date  . Anemia   . Anemia associated with chronic renal failure   . Anemia in chronic kidney disease 09/29/2018  . Arthritis   . Asthma   . Blood transfusion without reported diagnosis    Phreesia 02/27/2020  . Cancer ([place])    Phreesia 02/27/2020  . Cataract    OD  . Chronic kidney disease    Phreesia 02/27/2020  . Coronary artery calcification seen on CAT scan 02/17/2018   Coronary calcification on CT  . Depression   . Diabetes mellitus without complication ([place])    Phreesia 02/27/2020  . Diabetic retinopathy of both eyes ([place])   . Diabetic ulcer of right foot associated with type 2 diabetes mellitus ([place])   . ESRD (end stage renal disease) on dialysis ([place])   . Essential hypertension 02/17/2018   Essential hypertension  . GERD (gastroesophageal reflux disease)    pt denies  . HLD (hyperlipidemia)   . Hypertension   . Hypertensive retinopathy    OU  . Left ventricular dysfunction 04/07/2018   Left ventricle dysfunction  . Microalbuminuria due to type 2 diabetes mellitus ([place])   . Neuromuscular disorder ([place])    diabetic neuropathy  . Nonproliferative retinopathy due to secondary diabetes ([place])   . Nonsustained ventricular tachycardia ([place]) 08/28/2018   Nonsustained ventricular tachycardia  . Pneumonia    hx of   . Renal cell cancer ([place])   . Right renal mass 03/10/2017  . Type 2 diabetes, controlled, with neuropathy ([place]) 06/22/2013  . Ulcer of other part of foot 06/22/2013   . 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 AMPUTATION TOE Right 10/29/2019   Procedure: RIGHT 2ND TOE AMPUTATION;  Surgeon: Edrick Kins, DPM;  Location: [facility];  Service: Podiatry;  Laterality: Right;  . BASCILIC VEIN TRANSPOSITION Left 08/08/2017   Procedure: BASILIC VEIN TRANSPOSITION FIRST STAGE LEFT ARM;  Surgeon: Serafina Mitchell, MD;  Location: [facility];  Service: Vascular;  Laterality: Left;  . BASCILIC VEIN TRANSPOSITION Left 05/14/2018   Procedure: SECOND STAGE BASILIC VEIN TRANSPOSITION LEFT ARM;  Surgeon: Serafina Mitchell, MD;  Location: [facility];  Service: Vascular;  Laterality: Left;  . CATARACT EXTRACTION Left   . EYE SURGERY    . ROBOTIC ADRENALECTOMY Left 03/10/2017   Procedure: XI ROBOTIC ADRENALECTOMY;  Surgeon: Nickie Retort, MD;  Location: [facility];  Service: Urology;  Laterality: Left;  . ROBOTIC ASSITED PARTIAL NEPHRECTOMY Right 03/10/2017   Procedure: XI  ROBOTIC ASSITED RADICAL NEPHRECTOMY;  Surgeon: Nickie Retort, MD;  Location: [facility];  Service: Urology;  Laterality: Right;  . TOE AMPUTATION Bilateral    both great toe ,, left foot 2nd toe 1/2    FAMILY HISTORY Family History  Problem Relation Age of Onset  . Heart disease Mother   . Diabetes Sister     SOCIAL HISTORY Social History   Tobacco Use  . Smoking status: Never Smoker  . Smokeless tobacco: Never Used  Vaping Use  . Vaping Use: Never used  Substance Use Topics  . Alcohol use: Yes    Alcohol/week: 0.0 standard drinks    Comment: occ  . Drug use: No         OPHTHALMIC EXAM:  Base Eye Exam    Visual Acuity (Snellen - Linear)      Right Left   Dist [place] 20/25 20/20-1   Dist ph  NI NI       Tonometry (Tonopen, 9:09 AM)      Right Left   Pressure 13 15       Pupils      Dark Light Shape React APD   Right 3 2 Round Brisk None   Left 3 2 Round Brisk None       Visual Fields (Counting fingers)       Left Right    Full Full       Extraocular Movement      Right Left    Full, Ortho Full, Ortho       Neuro/Psych    Oriented x3: Yes   Mood/Affect: Normal       Dilation    Both eyes: 1.0% Mydriacyl, 2.5% Phenylephrine @ 9:09 AM        Slit Lamp and Fundus Exam    Slit Lamp Exam      Right Left   Lids/Lashes Dermatochalasis - upper lid, mild MGD Dermatochalasis - upper lid, Telangiectasia, mild MGD   Conjunctiva/Sclera White and quiet White and quiet   Cornea 1+ Punctate epithelial erosions, mild Arcus, well healed temporal cataract wounds Well-healed temporal cataract wound   Anterior Chamber Deep and quiet Deep   Iris Round and moderately dilated to 5.33mm Round and well dilated   Lens PCIOL in good position PCIOL in good position   Vitreous Vitreous syneresis, Posterior vitreous detachment Vitreous syneresis       Fundus Exam      Right Left   Disc Mild Pallor, Sharp rim trace pallor, Sharp rim   C/D Ratio 0.3 0.3   Macula Blunted foveal reflex, scattered MA and IRH greatest inferiorly and nasal macula, trace cystic changes inf/nasal macula Flat, good foveal reflex, RPE mottling and clumping; interval improvement in scattered cystic changes, scattered, light focal laser changes   Vessels Vascular attenuation, Tortuous, AV crossing changes Tortuous, Vascular attenuation, AV crossing changes   Periphery Attached, scattered IRH/DBH greatest posteriorly Attached, rare MA          IMAGING AND PROCEDURES  Imaging and Procedures for $RemoveBefore'@TODAY'[id number]$ @  OCT, Retina - OU - Both Eyes       Right Eye Quality was good. Central Foveal Thickness: 256. Progression has been stable. Findings include normal foveal contour, no SRF, no IRF (Trace focal cystic changes inf nasal macula ).   Left Eye Quality was good. Central Foveal Thickness: 247. Progression has improved. Findings include normal foveal contour, intraretinal fluid, no SRF, vitreomacular adhesion , intraretinal hyper-reflective  material (Interval improvement in trace  cystic changes nasal macula ).   Notes *Images captured and stored on drive  Diagnosis / Impression:  NFP; +IRF; no SRF OU noncentral DME OU (OS > OD) OD - Trace focal cystic changes inf nasal macula  OS - Interval improvement in trace cystic changes nasal macula   Clinical management:  See below  Abbreviations: NFP - Normal foveal profile. CME - cystoid macular edema. PED - pigment epithelial detachment. IRF - intraretinal fluid. SRF - subretinal fluid. EZ - ellipsoid zone. ERM - epiretinal membrane. ORA - outer retinal atrophy. ORT - outer retinal tubulation. SRHM - subretinal hyper-reflective material                 ASSESSMENT/PLAN:    ICD-10-CM   1. Moderate nonproliferative diabetic retinopathy of both eyes with macular edema associated with type 2 diabetes mellitus ([place])  V40.9811 CANCELED: Intravitreal Injection, Pharmacologic Agent - OS - Left Eye  2. Retinal edema  H35.81 OCT, Retina - OU - Both Eyes  3. Essential hypertension  I10   4. Hypertensive retinopathy of both eyes  H35.033   5. Pseudophakia of both eyes  Z96.1     1,2 Moderate non-proliferative diabetic retinopathy w/ DME OU (OS > OD)  - Initial exam shows scattered MA, BCVA 20/50 OS  - Initial FA (05/12/18) shows leaking MA, no NV OS  - Initial OCT showed diabetic macular edema OU (OS > OD)   - s/p IVA #1 OS (10.22.19), #2 (11.20.19), #3 (12.20.19), #4 (01.31.20), #5 (03.13.20), #6 (04.22.20), #7 (06.05.20), #8 (07.17.20), #9 (08.28.20),#10 (10.07.20), #11 (12.04.20), #12 (02.21.21), #13 (05.12.21), #14 (07.21.21), #15 (09.15.21)  - s/p focal laser OS (11.10.21)  - today BCVA stable at 20/25 OD, 20/20 OS  - OCT OD - Trace focal cystic changes inf nasal macula; OS - interval improvement in cystic changes nasal macula   - no intervention recommended at this time -- pt in agreement  - [facility] informed consent form signed and scanned on 12.04.2020  - f/u in 6-8 wks,  DFE, OCT, possible injxn(s)   3,4. Hypertensive retinopathy OU  - discussed importance of tight BP control  - monitor  5. Pseudophakia OU  - s/p CE/IOL OU w/ Dr. [name] (OS 2020;  OD 5.19.21)  - beautiful surgeries, doing well  - monitor   Ophthalmic Meds Ordered this visit:  No orders of the defined types were placed in this encounter.      Return for f/u 6-8 weeks, NPDR OU, DFE, OCT.  There are no Patient Instructions on file for this visit.   Explained the diagnoses, plan, and follow up with the patient and they expressed understanding.  Patient expressed understanding of the importance of proper follow up care.   This document serves as a record of services personally performed by Gardiner Sleeper, MD, PhD. It was created on their behalf by San Jetty. Owens Shark, OA an ophthalmic technician. The creation of this record is the provider's dictation and/or activities during the visit.    Electronically signed by: San Jetty. Marguerita Merles 12.23.2021 12:38 PM   Gardiner Sleeper, M.D., Ph.D. Diseases & Surgery of the Retina and Vitreous [facility]  I have reviewed the above documentation for accuracy and completeness, and I agree with the above. Gardiner Sleeper, M.D., Ph.D. 07/22/20 12:38 PM   Abbreviations: M myopia (nearsighted); A astigmatism; H hyperopia (farsighted); P presbyopia; Mrx spectacle prescription;  CTL contact lenses; OD right eye; OS left eye; OU both  eyes  XT exotropia; ET esotropia; PEK punctate epithelial keratitis; PEE punctate epithelial erosions; DES dry eye syndrome; MGD meibomian gland dysfunction; ATs artificial tears; PFAT's preservative free artificial tears; [place] nuclear sclerotic cataract; PSC posterior subcapsular cataract; ERM epi-retinal membrane; PVD posterior vitreous detachment; RD retinal detachment; DM diabetes mellitus; DR diabetic retinopathy; NPDR non-proliferative diabetic retinopathy; PDR proliferative diabetic retinopathy; CSME  clinically significant macular edema; DME diabetic macular edema; dbh dot blot hemorrhages; CWS cotton wool spot; POAG primary open angle glaucoma; C/D cup-to-disc ratio; HVF humphrey visual field; GVF goldmann visual field; OCT optical coherence tomography; IOP intraocular pressure; BRVO Branch retinal vein occlusion; CRVO central retinal vein occlusion; CRAO central retinal artery occlusion; BRAO branch retinal artery occlusion; RT retinal tear; SB scleral buckle; PPV pars plana vitrectomy; VH Vitreous hemorrhage; PRP panretinal laser photocoagulation; IVK intravitreal kenalog; VMT vitreomacular traction; MH Macular hole;  NVD neovascularization of the disc; NVE neovascularization elsewhere; AREDS age related eye disease study; ARMD age related macular degeneration; POAG primary open angle glaucoma; EBMD epithelial/anterior basement membrane dystrophy; ACIOL anterior chamber intraocular lens; IOL intraocular lens; PCIOL posterior chamber intraocular lens; Phaco/IOL phacoemulsification with intraocular lens placement; [place] photorefractive keratectomy; LASIK laser assisted in situ keratomileusis; HTN hypertension; DM diabetes mellitus; COPD chronic obstructive pulmonary disease

## 2020-07-14 DIAGNOSIS — N186 End stage renal disease: Secondary | ICD-10-CM | POA: Diagnosis not present

## 2020-07-14 DIAGNOSIS — D509 Iron deficiency anemia, unspecified: Secondary | ICD-10-CM | POA: Diagnosis not present

## 2020-07-14 DIAGNOSIS — D631 Anemia in chronic kidney disease: Secondary | ICD-10-CM | POA: Diagnosis not present

## 2020-07-14 DIAGNOSIS — N2581 Secondary hyperparathyroidism of renal origin: Secondary | ICD-10-CM | POA: Diagnosis not present

## 2020-07-14 DIAGNOSIS — Z992 Dependence on renal dialysis: Secondary | ICD-10-CM | POA: Diagnosis not present

## 2020-07-14 DIAGNOSIS — D689 Coagulation defect, unspecified: Secondary | ICD-10-CM | POA: Diagnosis not present

## 2020-07-16 DIAGNOSIS — N2581 Secondary hyperparathyroidism of renal origin: Secondary | ICD-10-CM | POA: Diagnosis not present

## 2020-07-16 DIAGNOSIS — N186 End stage renal disease: Secondary | ICD-10-CM | POA: Diagnosis not present

## 2020-07-16 DIAGNOSIS — D509 Iron deficiency anemia, unspecified: Secondary | ICD-10-CM | POA: Diagnosis not present

## 2020-07-16 DIAGNOSIS — D689 Coagulation defect, unspecified: Secondary | ICD-10-CM | POA: Diagnosis not present

## 2020-07-16 DIAGNOSIS — Z992 Dependence on renal dialysis: Secondary | ICD-10-CM | POA: Diagnosis not present

## 2020-07-16 DIAGNOSIS — D631 Anemia in chronic kidney disease: Secondary | ICD-10-CM | POA: Diagnosis not present

## 2020-07-18 DIAGNOSIS — Z992 Dependence on renal dialysis: Secondary | ICD-10-CM | POA: Diagnosis not present

## 2020-07-18 DIAGNOSIS — N2581 Secondary hyperparathyroidism of renal origin: Secondary | ICD-10-CM | POA: Diagnosis not present

## 2020-07-18 DIAGNOSIS — D509 Iron deficiency anemia, unspecified: Secondary | ICD-10-CM | POA: Diagnosis not present

## 2020-07-18 DIAGNOSIS — N186 End stage renal disease: Secondary | ICD-10-CM | POA: Diagnosis not present

## 2020-07-18 DIAGNOSIS — D631 Anemia in chronic kidney disease: Secondary | ICD-10-CM | POA: Diagnosis not present

## 2020-07-18 DIAGNOSIS — D689 Coagulation defect, unspecified: Secondary | ICD-10-CM | POA: Diagnosis not present

## 2020-07-20 DIAGNOSIS — D689 Coagulation defect, unspecified: Secondary | ICD-10-CM | POA: Diagnosis not present

## 2020-07-20 DIAGNOSIS — N186 End stage renal disease: Secondary | ICD-10-CM | POA: Diagnosis not present

## 2020-07-20 DIAGNOSIS — D631 Anemia in chronic kidney disease: Secondary | ICD-10-CM | POA: Diagnosis not present

## 2020-07-20 DIAGNOSIS — D509 Iron deficiency anemia, unspecified: Secondary | ICD-10-CM | POA: Diagnosis not present

## 2020-07-20 DIAGNOSIS — Z992 Dependence on renal dialysis: Secondary | ICD-10-CM | POA: Diagnosis not present

## 2020-07-20 DIAGNOSIS — N2581 Secondary hyperparathyroidism of renal origin: Secondary | ICD-10-CM | POA: Diagnosis not present

## 2020-07-21 ENCOUNTER — Encounter (INDEPENDENT_AMBULATORY_CARE_PROVIDER_SITE_OTHER): Payer: Self-pay | Admitting: Ophthalmology

## 2020-07-21 ENCOUNTER — Ambulatory Visit (INDEPENDENT_AMBULATORY_CARE_PROVIDER_SITE_OTHER): Payer: Medicare Other | Admitting: Ophthalmology

## 2020-07-21 ENCOUNTER — Other Ambulatory Visit: Payer: Self-pay

## 2020-07-21 DIAGNOSIS — E113313 Type 2 diabetes mellitus with moderate nonproliferative diabetic retinopathy with macular edema, bilateral: Secondary | ICD-10-CM

## 2020-07-21 DIAGNOSIS — Z961 Presence of intraocular lens: Secondary | ICD-10-CM | POA: Diagnosis not present

## 2020-07-21 DIAGNOSIS — I10 Essential (primary) hypertension: Secondary | ICD-10-CM | POA: Diagnosis not present

## 2020-07-21 DIAGNOSIS — H35033 Hypertensive retinopathy, bilateral: Secondary | ICD-10-CM | POA: Diagnosis not present

## 2020-07-21 DIAGNOSIS — N186 End stage renal disease: Secondary | ICD-10-CM | POA: Diagnosis not present

## 2020-07-21 DIAGNOSIS — I129 Hypertensive chronic kidney disease with stage 1 through stage 4 chronic kidney disease, or unspecified chronic kidney disease: Secondary | ICD-10-CM | POA: Diagnosis not present

## 2020-07-21 DIAGNOSIS — H3581 Retinal edema: Secondary | ICD-10-CM

## 2020-07-21 DIAGNOSIS — Z992 Dependence on renal dialysis: Secondary | ICD-10-CM | POA: Diagnosis not present

## 2020-07-25 DIAGNOSIS — D509 Iron deficiency anemia, unspecified: Secondary | ICD-10-CM | POA: Diagnosis not present

## 2020-07-25 DIAGNOSIS — R197 Diarrhea, unspecified: Secondary | ICD-10-CM | POA: Diagnosis not present

## 2020-07-25 DIAGNOSIS — N186 End stage renal disease: Secondary | ICD-10-CM | POA: Diagnosis not present

## 2020-07-25 DIAGNOSIS — N2581 Secondary hyperparathyroidism of renal origin: Secondary | ICD-10-CM | POA: Diagnosis not present

## 2020-07-25 DIAGNOSIS — E1129 Type 2 diabetes mellitus with other diabetic kidney complication: Secondary | ICD-10-CM | POA: Diagnosis not present

## 2020-07-25 DIAGNOSIS — D689 Coagulation defect, unspecified: Secondary | ICD-10-CM | POA: Diagnosis not present

## 2020-07-25 DIAGNOSIS — Z992 Dependence on renal dialysis: Secondary | ICD-10-CM | POA: Diagnosis not present

## 2020-07-25 DIAGNOSIS — D631 Anemia in chronic kidney disease: Secondary | ICD-10-CM | POA: Diagnosis not present

## 2020-07-27 ENCOUNTER — Other Ambulatory Visit: Payer: Self-pay

## 2020-07-27 ENCOUNTER — Encounter (HOSPITAL_BASED_OUTPATIENT_CLINIC_OR_DEPARTMENT_OTHER): Payer: Medicare Other | Attending: Internal Medicine | Admitting: Internal Medicine

## 2020-07-27 DIAGNOSIS — L97526 Non-pressure chronic ulcer of other part of left foot with bone involvement without evidence of necrosis: Secondary | ICD-10-CM | POA: Diagnosis not present

## 2020-07-27 DIAGNOSIS — D689 Coagulation defect, unspecified: Secondary | ICD-10-CM | POA: Diagnosis not present

## 2020-07-27 DIAGNOSIS — E1129 Type 2 diabetes mellitus with other diabetic kidney complication: Secondary | ICD-10-CM | POA: Diagnosis not present

## 2020-07-27 DIAGNOSIS — Z880 Allergy status to penicillin: Secondary | ICD-10-CM | POA: Diagnosis not present

## 2020-07-27 DIAGNOSIS — N2581 Secondary hyperparathyroidism of renal origin: Secondary | ICD-10-CM | POA: Diagnosis not present

## 2020-07-27 DIAGNOSIS — Z992 Dependence on renal dialysis: Secondary | ICD-10-CM | POA: Insufficient documentation

## 2020-07-27 DIAGNOSIS — D631 Anemia in chronic kidney disease: Secondary | ICD-10-CM | POA: Diagnosis not present

## 2020-07-27 DIAGNOSIS — I120 Hypertensive chronic kidney disease with stage 5 chronic kidney disease or end stage renal disease: Secondary | ICD-10-CM | POA: Diagnosis not present

## 2020-07-27 DIAGNOSIS — L97522 Non-pressure chronic ulcer of other part of left foot with fat layer exposed: Secondary | ICD-10-CM | POA: Insufficient documentation

## 2020-07-27 DIAGNOSIS — R197 Diarrhea, unspecified: Secondary | ICD-10-CM | POA: Diagnosis not present

## 2020-07-27 DIAGNOSIS — E1122 Type 2 diabetes mellitus with diabetic chronic kidney disease: Secondary | ICD-10-CM | POA: Insufficient documentation

## 2020-07-27 DIAGNOSIS — Z89421 Acquired absence of other right toe(s): Secondary | ICD-10-CM | POA: Insufficient documentation

## 2020-07-27 DIAGNOSIS — N186 End stage renal disease: Secondary | ICD-10-CM | POA: Diagnosis not present

## 2020-07-27 DIAGNOSIS — L97516 Non-pressure chronic ulcer of other part of right foot with bone involvement without evidence of necrosis: Secondary | ICD-10-CM | POA: Diagnosis not present

## 2020-07-27 DIAGNOSIS — Z89411 Acquired absence of right great toe: Secondary | ICD-10-CM | POA: Diagnosis not present

## 2020-07-27 DIAGNOSIS — L97512 Non-pressure chronic ulcer of other part of right foot with fat layer exposed: Secondary | ICD-10-CM | POA: Insufficient documentation

## 2020-07-27 DIAGNOSIS — Z89412 Acquired absence of left great toe: Secondary | ICD-10-CM | POA: Diagnosis not present

## 2020-07-27 DIAGNOSIS — Z89422 Acquired absence of other left toe(s): Secondary | ICD-10-CM | POA: Insufficient documentation

## 2020-07-27 DIAGNOSIS — E11621 Type 2 diabetes mellitus with foot ulcer: Secondary | ICD-10-CM | POA: Diagnosis not present

## 2020-07-27 DIAGNOSIS — E1140 Type 2 diabetes mellitus with diabetic neuropathy, unspecified: Secondary | ICD-10-CM | POA: Diagnosis not present

## 2020-07-27 DIAGNOSIS — I872 Venous insufficiency (chronic) (peripheral): Secondary | ICD-10-CM | POA: Diagnosis not present

## 2020-07-27 DIAGNOSIS — D509 Iron deficiency anemia, unspecified: Secondary | ICD-10-CM | POA: Diagnosis not present

## 2020-07-27 NOTE — Progress Notes (Signed)
[name], [name] ([id number]) Visit Report for 07/27/2020 Debridement Details Patient Name: Date of Service: [name] [name], [name] 07/27/2020 12:30 PM Medical Record Number: [id number] Patient Account Number: [id number] Date of Birth/Sex: Treating RN: Nov 10, 1950 (70 y.o. [name], Meta.Reding Primary Care Provider: [name] Other Clinician: Referring Provider: Treating Provider/Extender: Rosezena Sensor in Treatment: 24 Debridement Performed for Assessment: Wound #19 Right T Third oe Performed By: Physician Ricard Dillon., MD Debridement Type: Debridement Severity of Tissue Pre Debridement: Bone involvement without necrosis Level of Consciousness (Pre-procedure): Awake and Alert Pre-procedure Verification/Time Out Yes - 13:30 Taken: Start Time: 13:31 Pain Control: Lidocaine 4% T opical Solution T Area Debrided (L x W): otal 1.1 (cm) x 1.2 (cm) = 1.32 (cm) Tissue and other material debrided: Viable, Non-Viable, Bone, Slough, Subcutaneous, Skin: Dermis , Skin: Epidermis, Slough Level: Skin/Subcutaneous Tissue/Muscle/Bone Debridement Description: Excisional Instrument: Curette Bleeding: Minimum Hemostasis Achieved: Pressure End Time: 13:37 Procedural Pain: 0 Post Procedural Pain: 0 Response to Treatment: Procedure was tolerated well Level of Consciousness (Post- Awake and Alert procedure): Post Debridement Measurements of Total Wound Length: (cm) 1.1 Width: (cm) 1.2 Depth: (cm) 0.1 Volume: (cm) 0.104 Character of Wound/Ulcer Post Debridement: Improved Severity of Tissue Post Debridement: Bone involvement without necrosis Post Procedure Diagnosis Same as Pre-procedure Electronic Signature(s) Signed: 07/27/2020 5:18:36 PM By: Linton Ham MD Signed: 07/27/2020 6:28:52 PM By: [name] Entered By: [name] on 07/27/2020 13:46:27 -------------------------------------------------------------------------------- Debridement Details Patient Name:  Date of Service: [name], [place] [name] 07/27/2020 12:30 PM Medical Record Number: [id number] Patient Account Number: [id number] Date of Birth/Sex: Treating RN: 1951/02/18 (70 y.o. [name], Meta.Reding Primary Care Provider: [name] Other Clinician: Referring Provider: Treating Provider/Extender: Rosezena Sensor in Treatment: 24 Debridement Performed for Assessment: Wound #8 Left T Third oe Performed By: Physician Ricard Dillon., MD Debridement Type: Debridement Severity of Tissue Pre Debridement: Bone involvement without necrosis Level of Consciousness (Pre-procedure): Awake and Alert Pre-procedure Verification/Time Out Yes - 13:30 Taken: Start Time: 13:31 Pain Control: Lidocaine 4% T opical Solution T Area Debrided (L x W): otal 0.3 (cm) x 0.4 (cm) = 0.12 (cm) Tissue and other material debrided: Viable, Non-Viable, Bone, Slough, Subcutaneous, Skin: Dermis , Skin: Epidermis, Slough Level: Skin/Subcutaneous Tissue/Muscle/Bone Debridement Description: Excisional Instrument: Curette Bleeding: Minimum Hemostasis Achieved: Pressure End Time: 13:37 Procedural Pain: 0 Post Procedural Pain: 0 Response to Treatment: Procedure was tolerated well Level of Consciousness (Post- Awake and Alert procedure): Post Debridement Measurements of Total Wound Length: (cm) 0.3 Width: (cm) 0.4 Depth: (cm) 0.3 Volume: (cm) 0.028 Character of Wound/Ulcer Post Debridement: Improved Severity of Tissue Post Debridement: Bone involvement without necrosis Post Procedure Diagnosis Same as Pre-procedure Electronic Signature(s) Signed: 07/27/2020 5:18:36 PM By: Linton Ham MD Signed: 07/27/2020 6:28:52 PM By: [name] Entered By: [name] on 07/27/2020 13:47:14 -------------------------------------------------------------------------------- HPI Details Patient Name: Date of Service: [name], [place] [name] 07/27/2020 12:30 PM Medical Record Number: [id number] Patient  Account Number: [id number] Date of Birth/Sex: Treating RN: [birth year] (70 y.o. [name] Primary Care Provider: [name] Other Clinician: Referring Provider: Treating Provider/Extender: Rosezena Sensor in Treatment: 24 History of Present Illness HPI Description: Evaluate7/21/2021 today patient presents for evaluation of ulcerations on her legs which she tells me tend to come and go as far as small blistering locations and sometimes are better than other times. Right now she tells me that this is actually doing a little bit better but nonetheless will not completely go  away. She has ulcerations bilaterally on her lower extremities. The right is worse than the left. 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 The patient has no evidence of active infection at this time. She however appears to have proficient blood flow into the extremities and I think would tolerate a 3 layer compression wrap she was noncompressible on arterial studies. Nonetheless there was no obvious signs of occlusion. 02/16/2020 on evaluation today patient appears to be doing much better in regard to her wounds in general. On the past week she has made significant improvements which is great news there is no signs of active infection at this time. No fevers, chills, nausea, vomiting, or diarrhea. 02/23/2020 on evaluation today patient appears to be doing well in regard to her lower extremity ulcers and ankle ulcers. Overall I feel like she is making great progress and in general I am extremely pleased with how things stand. There is no sign of active infection at this time which is also good news. She will require slight debridement around the right ankle region. 03/02/20-Patient returns at 1 week, has few new areas open on the left leg, especially 1 healed area on the medial ankle  that has slight small open area now, to anterior leg areas that started out as blisters that are open now. She was using juxta lite compression to the left, she used Band-Aid to cover the vesicle that had ruptured on the left anterior leg, we are using 3 layer compression on the right. We are using silver alginate to the toe wound on the left. 03/08/2020 upon evaluation today patient unfortunately is doing much worse in regard to her right leg although her left leg looks like is doing better. Again we have taken her compression wraps off last week since she was doing well and transitioned her to her Velcro compression wrap. However apparently the patient is stating this was not put on here in the clinic and she never put it on at home. With that being said unfortunately she developed swelling and opened up new wounds which are present today she has quite a few of them in fact. 03/15/2020 on evaluation today patient appears to be doing well with regard to her wounds on the lower extremities bilaterally. Fortunately there is no signs of active infection at this time. No fever chills noted. Overall I am actually very pleased with where things stand currently. 03/22/2020 on evaluation today patient appears to be doing fairly well in regard to her wounds in general. She just has really one area left on her toe everything else is healed on her legs. She does have her Velcro compression wraps which I think are appropriate at this point. Fortunately there is no signs of active infection at this time. No fevers, chills, nausea, vomiting, or diarrhea. 03/29/2020 on evaluation today patient appears to be doing poorly in regard to her right leg which is pretty much completely reopened compared to last week. She states it was okay until Saturday when she woke up she had blisters. With that being said it appears that this is likely stemming from the fact that the patient is apparently though I just found this out today  going to sleep in her bed with her legs up on the bed with her but throughout the night she tends to end up with her legs hanging off the side or bottom of the bed  on the floor. With that being said I think that this likely is happening much too long and then she has significant swelling the builds up as she sleeps. Obviously I think this is the reasoning behind what we are seeing currently. 04/05/2020 on evaluation today patient appears to be doing quite well with regard to her leg ulcers at this point. Fortunately there is no signs of anything significantly open again which is great news nonetheless I am to continue wrapping her for at least a couple of weeks due to the fact that she seems to continually reopen as soon as we unwrapped her. 04/12/2020 upon evaluation today patient appears to be doing excellent in regard to her leg ulcers all appear to be improving which is great news. She is also doing better in regard to the toe the infection seems to be under better control and the Santyl is helping to clean this up. Overall very pleased much more so than what I saw last week. 04/19/2020 on evaluation today patient appears to be doing well with regard to her leg ulcers which are showing no signs of opening at this point. Her toe is also showing signs of improvement she is about done with her antibiotic. Overall I feel like things are doing quite well. 04/26/2020 upon evaluation today patient appears to be doing unfortunately a little bit worse compared to previous. She has reopened wounds on the right anterior lower extremity. This is unfortunate as we were hoping that this would really maintain healing. That does not appear to be the case. I think with him the need to see about making a referral for venous studies and provider evaluation with [facility] imaging with the interventional radiologist. 08/11/2019 upon evaluation today patient appears to be doing well in general in regard to her legs. Her  right leg she does have a new wound that opened since last week. On the left leg everything appears to be healed she does still have the opening on her toe which we are managing but again this does not appear to be any worse. She just needs to get this to feeling and cover over. There is no evidence of infection which is at least great news. 05/17/2020 upon evaluation today patient appears to be doing decently well in regard to her leg ulcers this is good news. She does have still an opening on the left second toe. She has been let this dry out that not really what we want to see currently. For that reason I discussed with her again the fact that she needs to keep this covered and not allow it to dry out in order to allow for appropriate healing. She voiced understanding and states she did not know that. 05/24/2020 upon evaluation today patient's legs both appear to be still be completely healed which is great news. We are now going to transition into having her utilize her juxta lite compression wraps on the bilateral lower extremities. She is definitely ready for this. Her toe also seems to be doing better. 05/31/2020 on evaluation today patient appears to be doing well with regard to her legs. There is nothing open at this point. Her toe still has some slough noted and get a clean that off today by way of sharp debridement other than that I feel like the collagen is probably still her best bet in that regard. 06/07/2020 upon evaluation today patient appears to be doing well with regard to her left leg and her toe ulcer the  toe seems to be doing better the leg is doing okay. The right leg unfortunately she has new wounds I think her shoes are rubbing on the side of her ankle which is what is causing part of this issue. Fortunately there is no signs of active infection at this time which is great news. 06/14/2020 upon evaluation today patient appears to be doing well today in regard to most of her  wounds. Everything seems to be showing some signs of improvement or else healing. Fortunately there is no signs of active infection at this time. No fevers, chills, nausea, vomiting, or diarrhea. She has been using a silver collagen for her toe ulcer. Damage12/07/2019 on evaluation today patient appears to be doing okay in regard to her right medial ankle ulcer in her left toe ulcer. Both are a little bit better today visually but again she still is not doing as well as I like to see. She still continues to wear the worn-out 70 year old crocs which are not good for her to be honest I think it is causing and preventing healing. Fortunately there is no signs of active infection at this time. No fevers, chills, nausea, vomiting, or diarrhea. 06/28/20 upon evaluation today patient appears to be doing poorly in regard to her second toes bilaterally. These both appear to be rubbing on her shoes these are shoes that have asked him to get rid of every visit since have been seeing her. I had a much more lengthy conversation with her about it last week she told me she was going to get different shoes to wear I get she comes in with the same shoes on today. Nonetheless I think this is part of her problem. Obviously the left second toe appears to be infected at this point. The right necessarily see evidence of this but nonetheless is definitely been rubbing where it sticks up. 07/05/2020 on evaluation today patient's toe ulcers appear to be doing really about the same the left toe may be a little better in the right toe a little worse but overall not much change. Fortunately there is no signs of active infection at this time. No fevers, chills, nausea, vomiting, or diarrhea. 07/27/2020. Patient I do not usually see. She has wounds on her dorsal bilateral third toes. 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 She  comes in today using Santyl on the wounds. The left third toe is red and swollen right third toe with thick eschar over the wound surface. Electronic Signature(s) Signed: 07/27/2020 5:18:36 PM By: Linton Ham MD Entered By: Linton Ham on 07/27/2020 13:45:45 -------------------------------------------------------------------------------- Physical Exam Details Patient Name: Date of Service: [name], [place] [name] 07/27/2020 12:30 PM Medical Record Number: [id number] Patient Account Number: [id number] Date of Birth/Sex: Treating RN: 1951/01/06 (70 y.o. [name] Primary Care Provider: [name] Other Clinician: Referring Provider: Treating Provider/Extender: Rosezena Sensor in Treatment: 24 Constitutional Patient is hypotensive. Appears well. Slightly tachycardic. Respirations regular, non-labored and within target range.. Temperature is normal and within the target range for the patient.Marland Kitchen Appears in no distress. Notes Wound exam; dorsal third toes bilaterally. The area on the left easily probes to bone. The area on the right had thick eschar over the wound which I removed with a #3 curette this also has exposed bone. The most concerning thing today is the amount of edema of the left  third toe as well as erythema. She is insensate does not feel pain Electronic Signature(s) Signed: 07/27/2020 5:18:36 PM By: Linton Ham MD Entered By: Linton Ham on 07/27/2020 13:49:59 -------------------------------------------------------------------------------- Physician Orders Details Patient Name: Date of Service: [facility] [name], [place] [name] 07/27/2020 12:30 PM Medical Record Number: [id number] Patient Account Number: [id number] Date of Birth/Sex: Treating RN: 08/12/1950 (70 y.o. [name], Meta.Reding Primary Care Provider: [name] Other Clinician: Referring Provider: Treating Provider/Extender: Rosezena Sensor in Treatment: 24 Verbal / Phone  Orders: No Diagnosis Coding ICD-10 Coding Code Description I87.2 Venous insufficiency (chronic) (peripheral) E11.621 Type 2 diabetes mellitus with foot ulcer L97.522 Non-pressure chronic ulcer of other part of left foot with fat layer exposed L97.512 Non-pressure chronic ulcer of other part of right foot with fat layer exposed N18.6 End stage renal disease Z99.2 Dependence on renal dialysis I10 Essential (primary) hypertension Follow-up Appointments Return Appointment in 1 week. Bathing/ Shower/ Hygiene May shower and wash wound with soap and water. Edema Control - Lymphedema / SCD / Other Bilateral Lower Extremities Elevate legs to the level of the heart or above for 30 minutes daily and/or when sitting, a frequency of: Avoid standing for long periods of time. Exercise regularly Moisturize legs daily. Compression stocking or Garment 20-30 mm/Hg pressure to: - juxtalites to both legs daily, apply in the morning and remove at night Wound Treatment Wound #19 - T Third oe Wound Laterality: Right Cleanser: Soap and Water Every Other Day/30 Days Discharge Instructions: May shower and wash wound with dial antibacterial soap and water prior to dressing change. Prim Dressing: KerraCel Ag Gelling Fiber Dressing, 4x5 in (silver alginate) Every Other Day/30 Days ary Discharge Instructions: Apply silver alginate to wound bed as instructed Secondary Dressing: Woven Gauze Sponges 2x2 in Every Other Day/30 Days Discharge Instructions: Apply over primary dressing as directed. Secured With: Child psychotherapist, Sterile 2x75 (in/in) Every Other Day/30 Days Discharge Instructions: Secure with stretch gauze as directed. Wound #8 - T Third oe Wound Laterality: Left Cleanser: Soap and Water Every Other Day/30 Days Discharge Instructions: May shower and wash wound with dial antibacterial soap and water prior to dressing change. Prim Dressing: KerraCel Ag Gelling Fiber Dressing, 4x5 in  (silver alginate) Every Other Day/30 Days ary Discharge Instructions: Apply silver alginate to wound bed as instructed Secondary Dressing: Woven Gauze Sponges 2x2 in Every Other Day/30 Days Discharge Instructions: Apply over primary dressing as directed. Secured With: Child psychotherapist, Sterile 2x75 (in/in) Every Other Day/30 Days Discharge Instructions: Secure with stretch gauze as directed. Radiology X-ray, foot right - x-ray of right foot related non healing diabetic foot ulcer. CPT code - (ICD10 E11.621 - Type 2 diabetes mellitus with foot ulcer) X-ray, foot left - x-ray of left foot related non healing diabetic foot ulcer. CPT code - (ICD10 E11.621 - Type 2 diabetes mellitus with foot ulcer) Patient Medications llergies: penicillin, adhesive tape A Notifications Medication Indication Start End wound infection left 3rd1/12/2020 doxycycline monohydrate toe DOSE oral 100 mg capsule - 1capsule oral bid for 10 days Electronic Signature(s) Signed: 07/27/2020 1:54:34 PM By: Linton Ham MD Entered By: Linton Ham on 07/27/2020 13:54:32 Prescription 07/27/2020 -------------------------------------------------------------------------------- Doroteo Bradford MD Patient Name: Provider: 25-Sep-1950 [id number] Date of Birth: NPI#Jesse Sans Sex: DEA #: [phone] [id number] Phone #: License #: [place] Patient Address: [place] [address] [place], [place] [address] [place], [place] [address] [phone] Allergies  penicillin; adhesive tape Provider's Orders X-ray, foot right - ICD10: E11.621 - x-ray of right foot related non healing diabetic foot ulcer. CPT code Hand Signature: Date(s): Prescription 07/27/2020 Doroteo Bradford MD Patient Name: Provider: June 11, 1951 [id number] Date of Birth: NPI#: [id number] Sex: DEA #: [phone] [id number] Phone #: License #: [place] Patient Address: [place] [address] [place], [place] [address] [place], [place] [address] [phone] Allergies penicillin; adhesive tape Provider's Orders X-ray, foot left - ICD10: E11.621 - x-ray of left foot related non healing diabetic foot ulcer. CPT code Hand Signature: Date(s): Electronic Signature(s) Signed: 07/27/2020 5:18:36 PM By: Linton Ham MD Entered By: Linton Ham on 07/27/2020 13:54:35 -------------------------------------------------------------------------------- Problem List Details Patient Name: Date of Service: [facility] [name], [place] [name] 07/27/2020 12:30 PM Medical Record Number: [id number] Patient Account Number: [id number] Date of Birth/Sex: Treating RN: 26-Jul-1950 (70 y.o. [name], Meta.Reding Primary Care Provider: [name] Other Clinician: Referring Provider: Treating Provider/Extender: Rosezena Sensor in Treatment: 24 Active Problems ICD-10 Encounter Code Description Active Date MDM Diagnosis I87.2 Venous insufficiency (chronic) (peripheral) 02/09/2020 No Yes E11.621 Type 2 diabetes mellitus with foot ulcer 02/09/2020 No Yes L97.522 Non-pressure chronic ulcer of other part of left foot with fat layer exposed 05/31/2020 No Yes L97.512 Non-pressure chronic ulcer of other part of right foot with fat layer exposed 07/05/2020 No Yes N18.6 End stage renal disease 02/09/2020 No Yes Z99.2 Dependence on renal dialysis 02/09/2020 No Yes I10 Essential (primary) hypertension 02/09/2020 No Yes Inactive Problems Resolved Problems ICD-10 Code Description Active Date Resolved Date L97.822 Non-pressure chronic ulcer of other part of left lower leg with fat layer exposed 02/09/2020 02/09/2020 L97.312 Non-pressure chronic ulcer of right 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 [name] 07/27/2020 12:30 PM Medical Record Number: [id number] Patient Account Number: [id number] Date of Birth/Sex: Treating RN: July 29, 1950 (70 y.o. [name] Primary Care Provider: [name] Other Clinician: Referring Provider: Treating Provider/Extender: Rosezena Sensor in Treatment: 24 Subjective History of Present Illness (HPI) Evaluate7/21/2021 today patient presents for evaluation of ulcerations on her legs which she tells me tend to come and go as far as small blistering locations and sometimes are better than other times. Right now she tells me that this is actually doing a little bit better but nonetheless will not completely go away. She has ulcerations bilaterally on her lower extremities. The right is worse than the left. 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 The patient has no evidence of active infection at this time. She however appears to have proficient blood flow into the extremities and I think would tolerate a 3 layer compression wrap she was noncompressible on arterial studies. Nonetheless there was no obvious signs of occlusion. 02/16/2020 on evaluation today patient appears to be doing much better in regard to her wounds in general. On the past week she has made significant improvements which is great news there is no signs of active infection at  this time. No fevers, chills, nausea, vomiting, or diarrhea. 02/23/2020 on evaluation today  patient appears to be doing well in regard to her lower extremity ulcers and ankle ulcers. Overall I feel like she is making great progress and in general I am extremely pleased with how things stand. There is no sign of active infection at this time which is also good news. She will require slight debridement around the right ankle region. 03/02/20-Patient returns at 1 week, has few new areas open on the left leg, especially 1 healed area on the medial ankle that has slight small open area now, to anterior leg areas that started out as blisters that are open now. She was using juxta lite compression to the left, she used Band-Aid to cover the vesicle that had ruptured on the left anterior leg, we are using 3 layer compression on the right. We are using silver alginate to the toe wound on the left. 03/08/2020 upon evaluation today patient unfortunately is doing much worse in regard to her right leg although her left leg looks like is doing better. Again we have taken her compression wraps off last week since she was doing well and transitioned her to her Velcro compression wrap. However apparently the patient is stating this was not put on here in the clinic and she never put it on at home. With that being said unfortunately she developed swelling and opened up new wounds which are present today she has quite a few of them in fact. 03/15/2020 on evaluation today patient appears to be doing well with regard to her wounds on the lower extremities bilaterally. Fortunately there is no signs of active infection at this time. No fever chills noted. Overall I am actually very pleased with where things stand currently. 03/22/2020 on evaluation today patient appears to be doing fairly well in regard to her wounds in general. She just has really one area left on her toe everything else is healed on her legs. She does have her Velcro compression wraps which I think are appropriate at this point. Fortunately there is no  signs of active infection at this time. No fevers, chills, nausea, vomiting, or diarrhea. 03/29/2020 on evaluation today patient appears to be doing poorly in regard to her right leg which is pretty much completely reopened compared to last week. She states it was okay until Saturday when she woke up she had blisters. With that being said it appears that this is likely stemming from the fact that the patient is apparently though I just found this out today going to sleep in her bed with her legs up on the bed with her but throughout the night she tends to end up with her legs hanging off the side or bottom of the bed on the floor. With that being said I think that this likely is happening much too long and then she has significant swelling the builds up as she sleeps. Obviously I think this is the reasoning behind what we are seeing currently. 04/05/2020 on evaluation today patient appears to be doing quite well with regard to her leg ulcers at this point. Fortunately there is no signs of anything significantly open again which is great news nonetheless I am to continue wrapping her for at least a couple of weeks due to the fact that she seems to continually reopen as soon as we unwrapped her. 04/12/2020 upon evaluation today patient appears to be doing excellent in regard to her leg ulcers all appear  to be improving which is great news. She is also doing better in regard to the toe the infection seems to be under better control and the Santyl is helping to clean this up. Overall very pleased much more so than what I saw last week. 04/19/2020 on evaluation today patient appears to be doing well with regard to her leg ulcers which are showing no signs of opening at this point. Her toe is also showing signs of improvement she is about done with her antibiotic. Overall I feel like things are doing quite well. 04/26/2020 upon evaluation today patient appears to be doing unfortunately a little bit worse compared  to previous. She has reopened wounds on the right anterior lower extremity. This is unfortunate as we were hoping that this would really maintain healing. That does not appear to be the case. I think with him the need to see about making a referral for venous studies and provider evaluation with [facility] imaging with the interventional radiologist. 08/11/2019 upon evaluation today patient appears to be doing well in general in regard to her legs. Her right leg she does have a new wound that opened since last week. On the left leg everything appears to be healed she does still have the opening on her toe which we are managing but again this does not appear to be any worse. She just needs to get this to feeling and cover over. There is no evidence of infection which is at least great news. 05/17/2020 upon evaluation today patient appears to be doing decently well in regard to her leg ulcers this is good news. She does have still an opening on the left second toe. She has been let this dry out that not really what we want to see currently. For that reason I discussed with her again the fact that she needs to keep this covered and not allow it to dry out in order to allow for appropriate healing. She voiced understanding and states she did not know that. 05/24/2020 upon evaluation today patient's legs both appear to be still be completely healed which is great news. We are now going to transition into having her utilize her juxta lite compression wraps on the bilateral lower extremities. She is definitely ready for this. Her toe also seems to be doing better. 05/31/2020 on evaluation today patient appears to be doing well with regard to her legs. There is nothing open at this point. Her toe still has some slough noted and get a clean that off today by way of sharp debridement other than that I feel like the collagen is probably still her best bet in that regard. 06/07/2020 upon evaluation today patient  appears to be doing well with regard to her left leg and her toe ulcer the toe seems to be doing better the leg is doing okay. The right leg unfortunately she has new wounds I think her shoes are rubbing on the side of her ankle which is what is causing part of this issue. Fortunately there is no signs of active infection at this time which is great news. 06/14/2020 upon evaluation today patient appears to be doing well today in regard to most of her wounds. Everything seems to be showing some signs of improvement or else healing. Fortunately there is no signs of active infection at this time. No fevers, chills, nausea, vomiting, or diarrhea. She has been using a silver collagen for her toe ulcer. Damage12/07/2019 on evaluation today patient appears to be doing  okay in regard to her right medial ankle ulcer in her left toe ulcer. Both are a little bit better today visually but again she still is not doing as well as I like to see. She still continues to wear the worn-out 70 year old crocs which are not good for her to be honest I think it is causing and preventing healing. Fortunately there is no signs of active infection at this time. No fevers, chills, nausea, vomiting, or diarrhea. 06/28/20 upon evaluation today patient appears to be doing poorly in regard to her second toes bilaterally. These both appear to be rubbing on her shoes these are shoes that have asked him to get rid of every visit since have been seeing her. I had a much more lengthy conversation with her about it last week she told me she was going to get different shoes to wear I get she comes in with the same shoes on today. Nonetheless I think this is part of her problem. Obviously the left second toe appears to be infected at this point. The right necessarily see evidence of this but nonetheless is definitely been rubbing where it sticks up. 07/05/2020 on evaluation today patient's toe ulcers appear to be doing really about the same  the left toe may be a little better in the right toe a little worse but overall not much change. Fortunately there is no signs of active infection at this time. No fevers, chills, nausea, vomiting, or diarrhea. 07/27/2020. Patient I do not usually see. She has wounds on her dorsal bilateral third toes. 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 She comes in today using Santyl on the wounds. The left third toe is red and swollen right third toe with thick eschar over the wound surface. Objective Constitutional Patient is hypotensive. Appears well. Slightly tachycardic. Respirations regular, non-labored and within target range.. Temperature is normal and within the target range for the patient.Marland Kitchen Appears in no distress. Vitals Time Taken: 1:00 PM, Height: 67 in, Source: Stated, Weight: 202 lbs, Source: Stated, BMI: 31.6, Temperature: 98.4 F, Pulse: 108 bpm, Respiratory Rate: 18 breaths/min, Blood Pressure: 99/66 mmHg, Capillary Blood Glucose: 142 mg/dl. General Notes: glucose per pt report this am General Notes: Wound exam; dorsal third toes bilaterally. The area on the left easily probes to bone. The area on the right had thick eschar over the wound which I removed with a #3 curette this also has exposed bone. The most concerning thing today is the amount of edema of the left third toe as well as erythema. She is insensate does not feel pain Integumentary (Hair, Skin) Wound #19 status is Open. Original cause of wound was Footwear Injury. The wound is located on the Right T Third. The wound measures 1.1cm length x oe 1.2cm width x 0.1cm depth; 1.037cm^2 area and 0.104cm^3 volume. There is bone and Fat Layer (Subcutaneous Tissue) exposed. There is no tunneling or undermining noted. There is a small amount of serosanguineous drainage noted. The wound margin is distinct with the outline attached to  the wound base. There is no granulation within the wound bed. There is a large (67-100%) amount of necrotic tissue within the wound bed including Adherent Slough. Wound #8 status is Open. Original cause of wound was Blister. The wound is located on the Left T Third. The wound measures 0.3cm length x 0.4cm width x oe 0.3cm depth; 0.094cm^2  area and 0.028cm^3 volume. There is Fat Layer (Subcutaneous Tissue) exposed. There is no tunneling or undermining noted. There is a small amount of serosanguineous drainage noted. The wound margin is flat and intact. There is large (67-100%) red granulation within the wound bed. There is a small (1-33%) amount of necrotic tissue within the wound bed including Adherent Slough. 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 There was a Excisional Skin/Subcutaneous Tissue/Muscle/Bone Debridement with a total area of 1.32 sq cm performed by Ricard Dillon., MD. With the following instrument(s): Curette to remove Viable and Non-Viable tissue/material. Material removed includes Bone,Subcutaneous Tissue, Slough, Skin: Dermis, and Skin: Epidermis after achieving pain control using Lidocaine 4% T opical Solution. A time out was conducted at 13:30, prior to the start of the procedure. A Minimum amount of bleeding was controlled with Pressure. The procedure was tolerated well with a pain level of 0 throughout and a pain level of 0 following the  procedure. Post Debridement Measurements: 1.1cm length x 1.2cm width x 0.1cm depth; 0.104cm^3 volume. Character of Wound/Ulcer Post Debridement is improved. Severity of Tissue Post Debridement is: Bone involvement without necrosis. Post procedure Diagnosis Wound #19: Same as Pre-Procedure Wound #8 Pre-procedure diagnosis of Wound #8 is a Diabetic Wound/Ulcer of the Lower Extremity located on the Left T Third .Severity of Tissue Pre Debridement is: oe Bone involvement without necrosis. There was a Excisional Skin/Subcutaneous Tissue/Muscle/Bone Debridement with a total area of 0.12 sq cm performed by Ricard Dillon., MD. With the following instrument(s): Curette to remove Viable and Non-Viable tissue/material. Material removed includes Bone,Subcutaneous Tissue, Slough, Skin: Dermis, and Skin: Epidermis after achieving pain control using Lidocaine 4% T opical Solution. A time out was conducted at 13:30, prior to the start of the procedure. A Minimum amount of bleeding was controlled with Pressure. The procedure was tolerated well with a pain level of 0 throughout and a pain level of 0 following the procedure. Post Debridement Measurements: 0.3cm length x 0.4cm width x 0.3cm depth; 0.028cm^3 volume. Character of Wound/Ulcer Post Debridement is improved. Severity of Tissue Post Debridement is: Bone involvement without necrosis. Post procedure Diagnosis Wound #8: Same as Pre-Procedure Plan Follow-up Appointments: Return Appointment in 1 week. Bathing/ Shower/ Hygiene: May shower and wash wound with soap and water. Edema Control - Lymphedema / SCD / Other: Elevate legs to the level of the heart or above for 30 minutes daily and/or when sitting, a frequency of: Avoid standing for long periods of time. Exercise regularly Moisturize legs daily. Compression stocking or Garment 20-30 mm/Hg pressure to: - juxtalites to both legs daily, apply in the morning and remove at night Radiology ordered  were: X-ray, foot right - x-ray of right foot related non healing diabetic foot ulcer. CPT code, X-ray, foot left - x-ray of left foot related non healing diabetic foot ulcer. CPT code The following medication(s) was prescribed: doxycycline monohydrate oral 100 mg capsule 1capsule oral bid for 10 days for wound infection left 3rd toe starting 07/27/2020 WOUND #19: - T Third Wound Laterality: Right oe  Cleanser: Soap and Water Every Other Day/30 Days Discharge Instructions: May shower and wash wound with dial antibacterial soap and water prior to dressing change. Prim Dressing: KerraCel Ag Gelling Fiber Dressing, 4x5 in (silver alginate) Every Other Day/30 Days ary Discharge Instructions: Apply silver alginate to wound bed as instructed Secondary Dressing: Woven Gauze Sponges 2x2 in Every Other Day/30 Days Discharge Instructions: Apply over primary dressing as directed. Secured With: Child psychotherapist, Sterile 2x75 (in/in) Every Other Day/30 Days Discharge Instructions: Secure with stretch gauze as directed. WOUND #8: - T Third Wound Laterality: Left oe Cleanser: Soap and Water Every Other Day/30 Days Discharge Instructions: May shower and wash wound with dial antibacterial soap and water prior to dressing change. Prim Dressing: KerraCel Ag Gelling Fiber Dressing, 4x5 in (silver alginate) Every Other Day/30 Days ary Discharge Instructions: Apply silver alginate to wound bed as instructed Secondary Dressing: Woven Gauze Sponges 2x2 in Every Other Day/30 Days Discharge Instructions: Apply over primary dressing as directed. Secured With: Child psychotherapist, Sterile 2x75 (in/in) Every Other Day/30 Days Discharge Instructions: Secure with stretch gauze as directed. 1. I am going to use silver alginate as the primary dressing 2. X-rays of the bilateral feet looking at the toes specifically she is already had amputations of the first and second toes bilaterally. 3. She  does not have arterial insufficiency. Her ABIs in May 2021 were noncompressible bilaterally however there was triphasic waveforms bilaterally. The pressure in most of her digits were normal 4. It would be possible to get a small scraping or culture of bone which may be necessary especially on the left. For now I think she has cellulitis of this toe and I am going to give her empiric doxycycline Electronic Signature(s) Signed: 07/27/2020 1:54:59 PM By: Linton Ham MD Entered By: Linton Ham on 07/27/2020 13:54:58 -------------------------------------------------------------------------------- SuperBill Details Patient Name: Date of Service: [name], [place] [name] 07/27/2020 Medical Record Number: [id number] Patient Account Number: [id number] Date of Birth/Sex: Treating RN: 1950/12/01 (70 y.o. [name], Meta.Reding Primary Care Provider: [name] Other Clinician: Referring Provider: Treating Provider/Extender: Rosezena Sensor in Treatment: 24 Diagnosis Coding ICD-10 Codes Code Description I87.2 Venous insufficiency (chronic) (peripheral) E11.621 Type 2 diabetes mellitus with foot ulcer L97.522 Non-pressure chronic ulcer of other part of left foot with fat layer exposed L97.512 Non-pressure chronic ulcer of other part of right foot with fat layer exposed N18.6 End stage renal disease Z99.2 Dependence on renal dialysis I10 Essential (primary) hypertension Facility Procedures CPT4 Code: [id number] Description: 30092 - DEB BONE 20 SQ CM/< ICD-10 Diagnosis Description L97.522 Non-pressure chronic ulcer of other part of left foot with fat layer expos L97.512 Non-pressure chronic ulcer of other part of right foot with fat layer expo Modifier: ed sed Quantity: 1 Physician Procedures CPT4: Description Modifier Code [id number] Debridement; bone (includes epidermis, dermis, subQ tissue, muscle and/or fascia, if performed) 1st 20 sqcm or less ICD-10 Diagnosis Description  L97.522 Non-pressure chronic ulcer of other part of left foot with  fat layer exposed L97.512 Non-pressure chronic ulcer of other part of right foot with fat layer exposed Quantity: 1 Electronic Signature(s) Signed: 07/27/2020 5:18:36 PM By: Linton Ham MD Entered By: Linton Ham on 07/27/2020 13:55:12

## 2020-07-27 NOTE — Progress Notes (Signed)
[name], [name] ([id number]) Visit Report for 07/27/2020 Arrival Information Details Patient Name: Date of Service: [name] [name], [name] 07/27/2020 12:30 PM Medical Record Number: [id number] Patient Account Number: [id number] Date of Birth/Sex: Treating RN: 30-Mar-1951 (70 y.o. [name] Primary Care Michella Detjen: [name] Other Clinician: Referring Tracker Mance: Treating Kenndra Morris/Extender: Rosezena Sensor in Treatment: 24 Visit Information History Since Last Visit Added or deleted any medications: No Patient Arrived: Wheel Chair Any new allergies or adverse reactions: No Arrival Time: 13:00 Had a fall or experienced change in No Accompanied By: sister activities of daily living that may affect Transfer Assistance: None risk of falls: Patient Identification Verified: Yes Signs or symptoms of abuse/neglect since last visito No Secondary Verification Process Completed: Yes Hospitalized since last visit: No Patient Requires Transmission-Based Precautions: No Implantable device outside of the clinic excluding No Patient Has Alerts: Yes cellular tissue based products placed in the center Patient Alerts: Right ABI: [name] since last visit: Left ABI: [name] Has Dressing in Place as Prescribed: Yes Has Compression in Place as Prescribed: Yes Pain Present Now: No Electronic Signature(s) Signed: 07/27/2020 6:02:34 PM By: [name] RN, BSN Entered By: [name] on 07/27/2020 13:00:47 -------------------------------------------------------------------------------- Encounter Discharge Information Details Patient Name: Date of Service: [facility] [name], [place] [name] 07/27/2020 12:30 PM Medical Record Number: [id number] Patient Account Number: [id number] Date of Birth/Sex: Treating RN: 1950-11-13 (70 y.o. [name] Primary Care Aija Scarfo: [name] Other Clinician: Referring Titianna Loomis: Treating Steffanie Mingle/Extender: Rosezena Sensor in Treatment:  24 Encounter Discharge Information Items Post Procedure Vitals Discharge Condition: Stable Temperature (F): 98.4 Ambulatory Status: Wheelchair Pulse (bpm): 108 Discharge Destination: Home Respiratory Rate (breaths/min): 18 Transportation: Private Auto Blood Pressure (mmHg): 99/66 Accompanied By: sister Schedule Follow-up Appointment: Yes Clinical Summary of Care: Patient Declined Electronic Signature(s) Signed: 07/27/2020 6:02:34 PM By: [name] RN, BSN Entered By: [name] on 07/27/2020 14:11:20 -------------------------------------------------------------------------------- Lower Extremity Assessment Details Patient Name: Date of Service: [name] [name], [place] [name] 07/27/2020 12:30 PM Medical Record Number: [id number] Patient Account Number: [id number] Date of Birth/Sex: Treating RN: 03/27/1951 (70 y.o. [name] Primary Care Nikola Blackston: [name] Other Clinician: Referring Maisley Hainsworth: Treating [name]/Extender: Rosezena Sensor in Treatment: 24 Edema Assessment Assessed: [name]: No] Patrice [name]: No] Edema: [Left: No] [Right: No] Calf Left: Right: Point of Measurement: 38 cm From Medial Instep 34.7 cm 34.8 cm Ankle Left: Right: Point of Measurement: 13 cm From Medial Instep 22.5 cm 24 cm Vascular Assessment Pulses: Dorsalis Pedis Palpable: [Left:No] [Right:No] Electronic Signature(s) Signed: 07/27/2020 6:02:34 PM By: [name] RN, BSN Entered By: [name] on 07/27/2020 13:08:28 -------------------------------------------------------------------------------- Multi Wound Chart Details Patient Name: Date of Service: [name], [place] [name] 07/27/2020 12:30 PM Medical Record Number: [id number] Patient Account Number: [id number] Date of Birth/Sex: Treating RN: 1950/07/26 (70 y.o. [name], [name] Primary Care [name]: [name] Other Clinician: Referring Adlai Sinning: Treating [name]/Extender: Rosezena Sensor in Treatment: 24 Vital Signs Height(in): 30 Capillary Blood Glucose(mg/dl): 142 Weight(lbs): 202 Pulse(bpm): 108 Body Mass Index(BMI): 32 Blood Pressure(mmHg): 99/66 Temperature(F): 98.4 Respiratory Rate(breaths/min): 18 Photos: [19:No 79 Right T Third oe] [8:No 65 Left T Third oe] [N/A:N/A N/A] Wound Location: [19:Footwear Injury] [8:Blister] [N/A:N/A] Wounding Event: [19:Diabetic Wound/Ulcer of the Lower] [8:Diabetic Wound/Ulcer of the Lower] [N/A:N/A] Primary Etiology: [19:Extremity Cataracts, Anemia, Asthma,] [8:Extremity Cataracts, Anemia, Asthma,] [N/A:N/A] Comorbid History: [19:Hypertension, Type II Diabetes, End Stage Renal Disease, Osteomyelitis, Neuropathy 06/25/2020] [8:Hypertension, Type II Diabetes, End Stage Renal Disease, Osteomyelitis,  Neuropathy 02/23/2020] [N/A:N/A] Date Acquired: [19:4] [8:22] [N/A:N/A] Weeks of Treatment: [19:Open] [8:Open] [N/A:N/A] Wound Status: [19:1.1x1.2x0.1] [8:0.3x0.4x0.3] [N/A:N/A] Measurements L x W x D (cm) [19:1.037] [8:0.094] [N/A:N/A] A (cm) : rea [19:0.104] [8:0.028] [N/A:N/A] Volume (cm) : [19:-277.10%] [8:33.30%] [N/A:N/A] % Reduction in A rea: [19:-285.20%] [8:-100.00%] [N/A:N/A] % Reduction in Volume: [19:Grade 2] [8:Grade 2] [N/A:N/A] Classification: [19:Small] [8:Small] [N/A:N/A] Exudate A mount: [19:Serosanguineous] [8:Serosanguineous] [N/A:N/A] Exudate Type: [19:red, brown] [8:red, brown] [N/A:N/A] Exudate Color: [19:Distinct, outline attached] [8:Flat and Intact] [N/A:N/A] Wound Margin: [19:None Present (0%)] [8:Large (67-100%)] [N/A:N/A] Granulation A mount: [19:N/A] [8:Red] [N/A:N/A] Granulation Quality: [19:Large (67-100%)] [8:Small (1-33%)] [N/A:N/A] Necrotic A mount: [19:Fat Layer (Subcutaneous Tissue): Yes Fat Layer (Subcutaneous Tissue): Yes N/A] Exposed Structures: [19:Bone: Yes Fascia: No Tendon: No Muscle: No Joint: No None] [8:Fascia: No Tendon: No Muscle: No Joint: No Bone: No Medium  (34-66%)] [N/A:N/A] Treatment Notes Electronic Signature(s) Signed: 07/27/2020 5:18:36 PM By: Linton Ham MD Signed: 07/27/2020 6:28:52 PM By: [name] Entered By: Linton Ham on 07/27/2020 13:44:30 -------------------------------------------------------------------------------- Multi-Disciplinary Care Plan Details Patient Name: Date of Service: [facility] [name], [place] [name] 07/27/2020 12:30 PM Medical Record Number: [id number] Patient Account Number: [id number] Date of Birth/Sex: Treating RN: 1951-04-11 (70 y.o. [name] Primary Care Gabbrielle Mcnicholas: [name] Other Clinician: Referring Holleigh Crihfield: Treating Mell Mellott/Extender: Rosezena Sensor in Treatment: 24 Active Inactive Venous Leg Ulcer Nursing Diagnoses: Knowledge deficit related to disease process and management Potential for venous Insuffiency (use before diagnosis confirmed) Goals: Patient will maintain optimal edema control Date Initiated: 02/09/2020 Target Resolution Date: 09/22/2020 Goal Status: Active Patient/caregiver will verbalize understanding of disease process and disease management Date Initiated: 02/09/2020 Date Inactivated: 03/02/2020 Target Resolution Date: 03/08/2020 Goal Status: Met Interventions: Assess peripheral edema status every visit. Compression as ordered Provide education on venous insufficiency Treatment Activities: Therapeutic compression applied : 02/09/2020 Notes: Wound/Skin Impairment Nursing Diagnoses: Impaired tissue integrity Knowledge deficit related to ulceration/compromised skin integrity Goals: Patient/caregiver will verbalize understanding of skin care regimen Date Initiated: 02/09/2020 Target Resolution Date: 10/14/2020 Goal Status: Active Ulcer/skin breakdown will have a volume reduction of 30% by week 4 Date Initiated: 02/09/2020 Date Inactivated: 03/02/2020 Target Resolution Date: 03/08/2020 Goal Status: Met Interventions: Assess patient/caregiver  ability to obtain necessary supplies Assess patient/caregiver ability to perform ulcer/skin care regimen upon admission and as needed Assess ulceration(s) every visit Provide education on ulcer and skin care Treatment Activities: Skin care regimen initiated : 02/09/2020 Topical wound management initiated : 02/09/2020 Notes: Electronic Signature(s) Signed: 07/27/2020 6:28:52 PM By: [name] Entered By: [name] on 07/27/2020 13:38:13 -------------------------------------------------------------------------------- Pain Assessment Details Patient Name: Date of Service: [name], [place] [name] 07/27/2020 12:30 PM Medical Record Number: [id number] Patient Account Number: [id number] Date of Birth/Sex: Treating RN: [birth year] (70 y.o. [name] Primary Care Kimyatta Lecy: [name] Other Clinician: Referring Kiyra Slaubaugh: Treating Ashlon Lottman/Extender: Rosezena Sensor in Treatment: 24 Active Problems Location of Pain Severity and Description of Pain Patient Has Paino No Site Locations Rate the pain. Current Pain Level: 0 Pain Management and Medication Current Pain Management: Electronic Signature(s) Signed: 07/27/2020 6:02:34 PM By: [name] RN, BSN Entered By: [name] on 07/27/2020 13:07:50 -------------------------------------------------------------------------------- Patient/Caregiver Education Details Patient Name: Date of Service: [facility] [name], [place] [name] 1/6/2022andnbsp12:30 PM Medical Record Number: [id number] Patient Account Number: [id number] Date of Birth/Gender: Treating RN: 1950-08-01 (70 y.o. [name] Primary Care Physician: [name] Other Clinician: Referring Physician: Treating Physician/Extender: Rosezena Sensor in Treatment: 24 Education Assessment Education Provided To: Patient  Education Topics Provided Wound/Skin Impairment: Handouts: Skin Care Do's and Dont's Methods:  Explain/Verbal Responses: Reinforcements needed Electronic Signature(s) Signed: 07/27/2020 6:28:52 PM By: [name] Entered By: [name] on 07/27/2020 13:39:02 -------------------------------------------------------------------------------- Wound Assessment Details Patient Name: Date of Service: [name], [place] [name] 07/27/2020 12:30 PM Medical Record Number: [id number] Patient Account Number: [id number] Date of Birth/Sex: Treating RN: Apr 09, 1951 (70 y.o. [name] Primary Care Earlisha Sharples: [name] Other Clinician: Referring Nyasia Baxley: Treating Naina Sleeper/Extender: Rosezena Sensor in Treatment: 24 Wound Status Wound Number: 19 Primary Diabetic Wound/Ulcer of the Lower Extremity Etiology: Wound Location: Right T Third oe Wound Open Wounding Event: Footwear Injury Status: Date Acquired: 06/25/2020 Comorbid Cataracts, Anemia, Asthma, Hypertension, Type II Diabetes, End Weeks Of Treatment: 4 History: Stage Renal Disease, Osteomyelitis, Neuropathy Clustered Wound: No Wound Measurements Length: (cm) 1.1 Width: (cm) 1.2 Depth: (cm) 0.1 Area: (cm) 1.037 Volume: (cm) 0.104 % Reduction in Area: -277.1% % Reduction in Volume: -285.2% Epithelialization: None Tunneling: No Undermining: No Wound Description Classification: Grade 2 Wound Margin: Distinct, outline attached Exudate Amount: Small Exudate Type: Serosanguineous Exudate Color: red, brown Foul Odor After Cleansing: No Slough/Fibrino No Wound Bed Granulation Amount: None Present (0%) Exposed Structure Necrotic Amount: Large (67-100%) Fascia Exposed: No Necrotic Quality: Adherent Slough Fat Layer (Subcutaneous Tissue) Exposed: Yes Tendon Exposed: No Muscle Exposed: No Joint Exposed: No Bone Exposed: Yes Treatment Notes Wound #19 (Toe Third) Wound Laterality: Right Cleanser Soap and Water Discharge Instruction: May shower and wash wound with dial antibacterial soap and water  prior to dressing change. Peri-Wound Care Topical Primary Dressing KerraCel Ag Gelling Fiber Dressing, 4x5 in (silver alginate) Discharge Instruction: Apply silver alginate to wound bed as instructed Secondary Dressing Woven Gauze Sponges 2x2 in Discharge Instruction: Apply over primary dressing as directed. Secured With Conforming Stretch Gauze Bandage, Sterile 2x75 (in/in) Discharge Instruction: Secure with stretch gauze as directed. Compression Wrap Compression Stockings Add-Ons Electronic Signature(s) Signed: 07/27/2020 6:02:34 PM By: [name] RN, BSN Entered By: [name] on 07/27/2020 13:09:41 -------------------------------------------------------------------------------- Wound Assessment Details Patient Name: Date of Service: [name], [place] [name] 07/27/2020 12:30 PM Medical Record Number: [id number] Patient Account Number: [id number] Date of Birth/Sex: Treating RN: [birth year] (70 y.o. [name] Primary Care Trinidy Masterson: [name] Other Clinician: Referring Deniz Hannan: Treating Alzina Golda/Extender: Rosezena Sensor in Treatment: 24 Wound Status Wound Number: 8 Primary Diabetic Wound/Ulcer of the Lower Extremity Etiology: Wound Location: Left T Third oe Wound Open Wounding Event: Blister Status: Date Acquired: 02/23/2020 Comorbid Cataracts, Anemia, Asthma, Hypertension, Type II Diabetes, End Weeks Of Treatment: 22 History: Stage Renal Disease, Osteomyelitis, Neuropathy Clustered Wound: No Wound Measurements Length: (cm) 0.3 Width: (cm) 0.4 Depth: (cm) 0.3 Area: (cm) 0.094 Volume: (cm) 0.028 % Reduction in Area: 33.3% % Reduction in Volume: -100% Epithelialization: Medium (34-66%) Tunneling: No Undermining: No Wound Description Classification: Grade 2 Wound Margin: Flat and Intact Exudate Amount: Small Exudate Type: Serosanguineous Exudate Color: red, brown Foul Odor After Cleansing: No Slough/Fibrino Yes Wound  Bed Granulation Amount: Large (67-100%) Exposed Structure Granulation Quality: Red Fascia Exposed: No Necrotic Amount: Small (1-33%) Fat Layer (Subcutaneous Tissue) Exposed: Yes Necrotic Quality: Adherent Slough Tendon Exposed: No Muscle Exposed: No Joint Exposed: No Bone Exposed: No Treatment Notes Wound #8 (Toe Third) Wound Laterality: Left Cleanser Soap and Water Discharge Instruction: May shower and wash wound with dial antibacterial soap and water prior to dressing change. Peri-Wound Care Topical Primary Dressing KerraCel Ag Gelling Fiber Dressing, 4x5 in (silver alginate) Discharge Instruction:  Apply silver alginate to wound bed as instructed Secondary Dressing Woven Gauze Sponges 2x2 in Discharge Instruction: Apply over primary dressing as directed. Secured With Conforming Stretch Gauze Bandage, Sterile 2x75 (in/in) Discharge Instruction: Secure with stretch gauze as directed. Compression Wrap Compression Stockings Add-Ons Electronic Signature(s) Signed: 07/27/2020 6:02:34 PM By: [name] RN, BSN Entered By: [name] on 07/27/2020 13:10:21 -------------------------------------------------------------------------------- Vitals Details Patient Name: Date of Service: [name], [place] [name] 07/27/2020 12:30 PM Medical Record Number: [id number] Patient Account Number: [id number] Date of Birth/Sex: Treating RN: 06-01-1951 (70 y.o. [name] Primary Care Dale Ribeiro: [name] Other Clinician: Referring Shyrl Obi: Treating Lelend Heinecke/Extender: Rosezena Sensor in Treatment: 24 Vital Signs Time Taken: 13:00 Temperature (F): 98.4 Height (in): 67 Pulse (bpm): 108 Source: Stated Respiratory Rate (breaths/min): 18 Weight (lbs): 202 Blood Pressure (mmHg): 99/66 Source: Stated Capillary Blood Glucose (mg/dl): 142 Body Mass Index (BMI): 31.6 Reference Range: 80 - 120 mg / dl Notes glucose per pt report this am Electronic  Signature(s) Signed: 07/27/2020 6:02:34 PM By: [name] RN, BSN Entered By: [name] on 07/27/2020 13:01:28

## 2020-07-29 DIAGNOSIS — N2581 Secondary hyperparathyroidism of renal origin: Secondary | ICD-10-CM | POA: Diagnosis not present

## 2020-07-29 DIAGNOSIS — N186 End stage renal disease: Secondary | ICD-10-CM | POA: Diagnosis not present

## 2020-07-29 DIAGNOSIS — D689 Coagulation defect, unspecified: Secondary | ICD-10-CM | POA: Diagnosis not present

## 2020-07-29 DIAGNOSIS — D631 Anemia in chronic kidney disease: Secondary | ICD-10-CM | POA: Diagnosis not present

## 2020-07-29 DIAGNOSIS — D509 Iron deficiency anemia, unspecified: Secondary | ICD-10-CM | POA: Diagnosis not present

## 2020-07-29 DIAGNOSIS — R197 Diarrhea, unspecified: Secondary | ICD-10-CM | POA: Diagnosis not present

## 2020-07-29 DIAGNOSIS — E1129 Type 2 diabetes mellitus with other diabetic kidney complication: Secondary | ICD-10-CM | POA: Diagnosis not present

## 2020-07-29 DIAGNOSIS — Z992 Dependence on renal dialysis: Secondary | ICD-10-CM | POA: Diagnosis not present

## 2020-08-01 DIAGNOSIS — D509 Iron deficiency anemia, unspecified: Secondary | ICD-10-CM | POA: Diagnosis not present

## 2020-08-01 DIAGNOSIS — Z992 Dependence on renal dialysis: Secondary | ICD-10-CM | POA: Diagnosis not present

## 2020-08-01 DIAGNOSIS — R197 Diarrhea, unspecified: Secondary | ICD-10-CM | POA: Diagnosis not present

## 2020-08-01 DIAGNOSIS — D631 Anemia in chronic kidney disease: Secondary | ICD-10-CM | POA: Diagnosis not present

## 2020-08-01 DIAGNOSIS — D689 Coagulation defect, unspecified: Secondary | ICD-10-CM | POA: Diagnosis not present

## 2020-08-01 DIAGNOSIS — N2581 Secondary hyperparathyroidism of renal origin: Secondary | ICD-10-CM | POA: Diagnosis not present

## 2020-08-01 DIAGNOSIS — N186 End stage renal disease: Secondary | ICD-10-CM | POA: Diagnosis not present

## 2020-08-01 DIAGNOSIS — E1129 Type 2 diabetes mellitus with other diabetic kidney complication: Secondary | ICD-10-CM | POA: Diagnosis not present

## 2020-08-03 ENCOUNTER — Encounter (HOSPITAL_BASED_OUTPATIENT_CLINIC_OR_DEPARTMENT_OTHER): Payer: Medicare Other | Admitting: Internal Medicine

## 2020-08-03 ENCOUNTER — Other Ambulatory Visit: Payer: Self-pay

## 2020-08-03 DIAGNOSIS — E1122 Type 2 diabetes mellitus with diabetic chronic kidney disease: Secondary | ICD-10-CM | POA: Diagnosis not present

## 2020-08-03 DIAGNOSIS — N2581 Secondary hyperparathyroidism of renal origin: Secondary | ICD-10-CM | POA: Diagnosis not present

## 2020-08-03 DIAGNOSIS — E11621 Type 2 diabetes mellitus with foot ulcer: Secondary | ICD-10-CM | POA: Diagnosis not present

## 2020-08-03 DIAGNOSIS — E1129 Type 2 diabetes mellitus with other diabetic kidney complication: Secondary | ICD-10-CM | POA: Diagnosis not present

## 2020-08-03 DIAGNOSIS — Z89412 Acquired absence of left great toe: Secondary | ICD-10-CM | POA: Diagnosis not present

## 2020-08-03 DIAGNOSIS — I872 Venous insufficiency (chronic) (peripheral): Secondary | ICD-10-CM | POA: Diagnosis not present

## 2020-08-03 DIAGNOSIS — Z89421 Acquired absence of other right toe(s): Secondary | ICD-10-CM | POA: Diagnosis not present

## 2020-08-03 DIAGNOSIS — Z89411 Acquired absence of right great toe: Secondary | ICD-10-CM | POA: Diagnosis not present

## 2020-08-03 DIAGNOSIS — E1140 Type 2 diabetes mellitus with diabetic neuropathy, unspecified: Secondary | ICD-10-CM | POA: Diagnosis not present

## 2020-08-03 DIAGNOSIS — Z992 Dependence on renal dialysis: Secondary | ICD-10-CM | POA: Diagnosis not present

## 2020-08-03 DIAGNOSIS — D631 Anemia in chronic kidney disease: Secondary | ICD-10-CM | POA: Diagnosis not present

## 2020-08-03 DIAGNOSIS — I120 Hypertensive chronic kidney disease with stage 5 chronic kidney disease or end stage renal disease: Secondary | ICD-10-CM | POA: Diagnosis not present

## 2020-08-03 DIAGNOSIS — D689 Coagulation defect, unspecified: Secondary | ICD-10-CM | POA: Diagnosis not present

## 2020-08-03 DIAGNOSIS — Z89422 Acquired absence of other left toe(s): Secondary | ICD-10-CM | POA: Diagnosis not present

## 2020-08-03 DIAGNOSIS — L97522 Non-pressure chronic ulcer of other part of left foot with fat layer exposed: Secondary | ICD-10-CM | POA: Diagnosis not present

## 2020-08-03 DIAGNOSIS — N186 End stage renal disease: Secondary | ICD-10-CM | POA: Diagnosis not present

## 2020-08-03 DIAGNOSIS — Z880 Allergy status to penicillin: Secondary | ICD-10-CM | POA: Diagnosis not present

## 2020-08-03 DIAGNOSIS — R197 Diarrhea, unspecified: Secondary | ICD-10-CM | POA: Diagnosis not present

## 2020-08-03 DIAGNOSIS — D509 Iron deficiency anemia, unspecified: Secondary | ICD-10-CM | POA: Diagnosis not present

## 2020-08-03 DIAGNOSIS — L97512 Non-pressure chronic ulcer of other part of right foot with fat layer exposed: Secondary | ICD-10-CM | POA: Diagnosis not present

## 2020-08-03 NOTE — Progress Notes (Signed)
[name] ([id number]) Visit Report for 08/03/2020 Arrival Information Details Patient Name: Date of Service: [name] [name], [name] 08/03/2020 1:30 PM Medical Record Number: [id number] Patient Account Number: [id number] Date of Birth/Sex: Treating RN: 1950/12/24 (70 y.o. [name] Primary Care Hakop Humbarger: [name] Other Clinician: Referring Taha Dimond: Treating Gertrue Willette/Extender: Rosezena Sensor in Treatment: 25 Visit Information History Since Last Visit Added or deleted any medications: No Patient Arrived: Wheel Chair Any new allergies or adverse reactions: No Arrival Time: 13:57 Had a fall or experienced change in No Accompanied By: alone activities of daily living that may affect Transfer Assistance: None risk of falls: Patient Identification Verified: Yes Signs or symptoms of abuse/neglect since last visito No Secondary Verification Process Completed: Yes Hospitalized since last visit: No Patient Requires Transmission-Based Precautions: No Implantable device outside of the clinic excluding No Patient Has Alerts: Yes cellular tissue based products placed in the center Patient Alerts: Right ABI: [place] since last visit: Left ABI: [place] Has Dressing in Place as Prescribed: Yes Has Compression in Place as Prescribed: Yes Pain Present Now: No Electronic Signature(s) Signed: 08/03/2020 5:28:50 PM By: Levan Hurst RN, BSN Entered By: Levan Hurst on 08/03/2020 13:58:30 -------------------------------------------------------------------------------- Clinic Level of Care Assessment Details Patient Name: Date of Service: [name], [name] 08/03/2020 1:30 PM Medical Record Number: [id number] Patient Account Number: [id number] Date of Birth/Sex: Treating RN: [name], 1950 (69 y.o. [name], [place].Reding Primary Care Jurnee Nakayama: [name] Other Clinician: Referring Dillian Feig: Treating Kyrie Bun/Extender: Rosezena Sensor in Treatment:  25 Clinic Level of Care Assessment Items TOOL 4 Quantity Score X- 1 0 Use when only an EandM is performed on FOLLOW-UP visit ASSESSMENTS - Nursing Assessment / Reassessment X- 1 10 Reassessment of Co-morbidities (includes updates in patient status) X- 1 5 Reassessment of Adherence to Treatment Plan ASSESSMENTS - Wound and Skin A ssessment / Reassessment []  - 0 Simple Wound Assessment / Reassessment - one wound X- 2 5 Complex Wound Assessment / Reassessment - multiple wounds X- 1 10 Dermatologic / Skin Assessment (not related to wound area) ASSESSMENTS - Focused Assessment X- 2 5 Circumferential Edema Measurements - multi extremities X- 1 10 Nutritional Assessment / Counseling / Intervention []  - 0 Lower Extremity Assessment (monofilament, tuning fork, pulses) []  - 0 Peripheral Arterial Disease Assessment (using hand held doppler) ASSESSMENTS - Ostomy and/or Continence Assessment and Care []  - 0 Incontinence Assessment and Management []  - 0 Ostomy Care Assessment and Management (repouching, etc.) PROCESS - Coordination of Care []  - 0 Simple Patient / Family Education for ongoing care X- 1 20 Complex (extensive) Patient / Family Education for ongoing care X- 1 10 Staff obtains Programmer, systems, Records, T Results / Process Orders est []  - 0 Staff telephones HHA, Nursing Homes / Clarify orders / etc []  - 0 Routine Transfer to another Facility (non-emergent condition) []  - 0 Routine Hospital Admission (non-emergent condition) []  - 0 New Admissions / Biomedical engineer / Ordering NPWT Apligraf, etc. , []  - 0 Emergency Hospital Admission (emergent condition) []  - 0 Simple Discharge Coordination X- 1 15 Complex (extensive) Discharge Coordination PROCESS - Special Needs []  - 0 Pediatric / Minor Patient Management []  - 0 Isolation Patient Management []  - 0 Hearing / Language / Visual special needs []  - 0 Assessment of Community assistance (transportation, D/C  planning, etc.) []  - 0 Additional assistance / Altered mentation []  - 0 Support Surface(s) Assessment (bed, cushion, seat, etc.) INTERVENTIONS - Wound Cleansing / Measurement []  - 0  Simple Wound Cleansing - one wound X- 2 5 Complex Wound Cleansing - multiple wounds X- 1 5 Wound Imaging (photographs - any number of wounds) []  - 0 Wound Tracing (instead of photographs) []  - 0 Simple Wound Measurement - one wound X- 2 5 Complex Wound Measurement - multiple wounds INTERVENTIONS - Wound Dressings X - Small Wound Dressing one or multiple wounds 2 10 []  - 0 Medium Wound Dressing one or multiple wounds []  - 0 Large Wound Dressing one or multiple wounds []  - 0 Application of Medications - topical []  - 0 Application of Medications - injection INTERVENTIONS - Miscellaneous []  - 0 External ear exam []  - 0 Specimen Collection (cultures, biopsies, blood, body fluids, etc.) []  - 0 Specimen(s) / Culture(s) sent or taken to Lab for analysis []  - 0 Patient Transfer (multiple staff / Civil Service fast streamer / Similar devices) []  - 0 Simple Staple / Suture removal (25 or less) []  - 0 Complex Staple / Suture removal (26 or more) []  - 0 Hypo / Hyperglycemic Management (close monitor of Blood Glucose) []  - 0 Ankle / Brachial Index (ABI) - do not check if billed separately X- 1 5 Vital Signs Has the patient been seen at the hospital within the last three years: Yes Total Score: 150 Level Of Care: New/Established - Level 4 Electronic Signature(s) Signed: 08/03/2020 5:39:22 PM By: [name] Entered By: [name] on 08/03/2020 14:38:00 -------------------------------------------------------------------------------- Encounter Discharge Information Details Patient Name: Date of Service: [name] [name] 08/03/2020 1:30 PM Medical Record Number: [id number] Patient Account Number: [id number] Date of Birth/Sex: Treating RN: May 18, 1951 (70 y.o. [name] Primary Care Caidence Kaseman: [name] Other Clinician: Referring Merwyn Hodapp: Treating Nimesh Riolo/Extender: Rosezena Sensor in Treatment: 25 Encounter Discharge Information Items Discharge Condition: Stable Ambulatory Status: Wheelchair Discharge Destination: Home Transportation: Private Auto Accompanied By: alone Schedule Follow-up Appointment: Yes Clinical Summary of Care: Patient Declined Electronic Signature(s) Signed: 08/03/2020 5:28:50 PM By: Levan Hurst RN, BSN Entered By: Levan Hurst on 08/03/2020 16:53:00 -------------------------------------------------------------------------------- Lower Extremity Assessment Details Patient Name: Date of Service: [facility] [name], [place] [name] 08/03/2020 1:30 PM Medical Record Number: [id number] Patient Account Number: [id number] Date of Birth/Sex: Treating RN: 1950-12-06 (70 y.o. [name] Primary Care Danaya Geddis: [name] Other Clinician: Referring Wendy Mikles: Treating Taraann Olthoff/Extender: Rosezena Sensor in Treatment: 25 Edema Assessment Assessed: [name]: No] Patrice [name]: No] Edema: [Left: No] [Right: No] Calf Left: Right: Point of Measurement: 38 cm From Medial Instep 34 cm 33 cm Ankle Left: Right: Point of Measurement: 13 cm From Medial Instep 23 cm 23 cm Vascular Assessment Pulses: Dorsalis Pedis Palpable: [Left:Yes] [Right:Yes] Electronic Signature(s) Signed: 08/03/2020 5:28:50 PM By: Levan Hurst RN, BSN Entered By: Levan Hurst on 08/03/2020 14:07:12 -------------------------------------------------------------------------------- Multi Wound Chart Details Patient Name: Date of Service: [name], [place] [name] 08/03/2020 1:30 PM Medical Record Number: [id number] Patient Account Number: [id number] Date of Birth/Sex: Treating RN: December 21, 1950 (70 y.o. [name] Primary Care Latise Dilley: [name] Other Clinician: Referring Obelia Bonello: Treating Jaci Desanto/Extender: Rosezena Sensor in  Treatment: 25 Vital Signs Height(in): 60 Capillary Blood Glucose(mg/dl): 142 Weight(lbs): 202 Pulse(bpm): 104 Body Mass Index(BMI): 73 Blood Pressure(mmHg): 100/53 Temperature(F): 98.3 Respiratory Rate(breaths/min): 16 Photos: [19:No 45 Right T Third oe] [8:No 70 Left T Third oe] [N/A:N/A N/A] Wound Location: [19:Footwear Injury] [8:Blister] [N/A:N/A] Wounding Event: [19:Diabetic Wound/Ulcer of the Lower] [8:Diabetic Wound/Ulcer of the Lower] [N/A:N/A] Primary Etiology: [19:Extremity Cataracts, Anemia, Asthma,] [8:Extremity Cataracts, Anemia, Asthma,] [N/A:N/A] Comorbid History: [19:Hypertension,  Type II Diabetes, End Hypertension, Type II Diabetes, End Stage Renal Disease, Osteomyelitis, Stage Renal Disease, Osteomyelitis, Neuropathy 06/25/2020] [8:Neuropathy 02/23/2020] [N/A:N/A] Date Acquired: [19:5] [8:23] [N/A:N/A] Weeks of Treatment: [19:Open] [8:Open] [N/A:N/A] Wound Status: [19:1.1x1x0.1] [8:0.3x0.3x0.1] [N/A:N/A] Measurements L x W x D (cm) [19:0.864] [8:0.071] [N/A:N/A] A (cm) : rea [19:0.086] [8:0.007] [N/A:N/A] Volume (cm) : [19:-214.20%] [8:49.60%] [N/A:N/A] % Reduction in A rea: [19:-218.50%] [8:50.00%] [N/A:N/A] % Reduction in Volume: [19:Grade 2] [8:Grade 2] [N/A:N/A] Classification: [19:Small] [8:Small] [N/A:N/A] Exudate A mount: [19:Serosanguineous] [8:Serosanguineous] [N/A:N/A] Exudate Type: [19:red, brown] [8:red, brown] [N/A:N/A] Exudate Color: [19:Distinct, outline attached] [8:Flat and Intact] [N/A:N/A] Wound Margin: [19:Medium (34-66%)] [8:Large (67-100%)] [N/A:N/A] Granulation A mount: [19:Pink] [8:Red] [N/A:N/A] Granulation Quality: [19:Medium (34-66%)] [8:Small (1-33%)] [N/A:N/A] Necrotic A mount: [19:Fat Layer (Subcutaneous Tissue): Yes Fat Layer (Subcutaneous Tissue): Yes N/A] Exposed Structures: [19:Bone: Yes Fascia: No Tendon: No Muscle: No Joint: No None] [8:Fascia: No Tendon: No Muscle: No Joint: No Bone: No Medium (34-66%)]  [N/A:N/A] Treatment Notes Electronic Signature(s) Signed: 08/03/2020 5:01:02 PM By: Linton Ham MD Signed: 08/03/2020 5:39:22 PM By: [name] Entered By: Linton Ham on 08/03/2020 14:49:30 -------------------------------------------------------------------------------- Multi-Disciplinary Care Plan Details Patient Name: Date of Service: [employer] [name], [place] [name] 08/03/2020 1:30 PM Medical Record Number: [id number] Patient Account Number: [id number] Date of Birth/Sex: Treating RN: [birth year] (70 y.o. [name] Primary Care Sada Mazzoni: [name] Other Clinician: Referring Konnor Vondrasek: Treating Breland Trouten/Extender: Rosezena Sensor in Treatment: 25 Active Inactive Venous Leg Ulcer Nursing Diagnoses: Knowledge deficit related to disease process and management Potential for venous Insuffiency (use before diagnosis confirmed) Goals: Patient will maintain optimal edema control Date Initiated: 02/09/2020 Target Resolution Date: 09/22/2020 Goal Status: Active Patient/caregiver will verbalize understanding of disease process and disease management Date Initiated: 02/09/2020 Date Inactivated: 03/02/2020 Target Resolution Date: 03/08/2020 Goal Status: Met Interventions: Assess peripheral edema status every visit. Compression as ordered Provide education on venous insufficiency Treatment Activities: Therapeutic compression applied : 02/09/2020 Notes: Wound/Skin Impairment Nursing Diagnoses: Impaired tissue integrity Knowledge deficit related to ulceration/compromised skin integrity Goals: Patient/caregiver will verbalize understanding of skin care regimen Date Initiated: 02/09/2020 Target Resolution Date: 10/14/2020 Goal Status: Active Ulcer/skin breakdown will have a volume reduction of 30% by week 4 Date Initiated: 02/09/2020 Date Inactivated: 03/02/2020 Target Resolution Date: 03/08/2020 Goal Status: Met Interventions: Assess patient/caregiver ability to  obtain necessary supplies Assess patient/caregiver ability to perform ulcer/skin care regimen upon admission and as needed Assess ulceration(s) every visit Provide education on ulcer and skin care Treatment Activities: Skin care regimen initiated : 02/09/2020 Topical wound management initiated : 02/09/2020 Notes: Electronic Signature(s) Signed: 08/03/2020 5:39:22 PM By: [name] Entered By: [name] on 08/03/2020 13:43:11 -------------------------------------------------------------------------------- Pain Assessment Details Patient Name: Date of Service: [name], [name] 08/03/2020 1:30 PM Medical Record Number: [id number] Patient Account Number: [id number] Date of Birth/Sex: Treating RN: April 10, 1951 (70 y.o. [name] Primary Care Keyler Hoge: [name] Other Clinician: Referring Hetal Proano: Treating Allyssia Skluzacek/Extender: Rosezena Sensor in Treatment: 25 Active Problems Location of Pain Severity and Description of Pain Patient Has Paino No Site Locations Pain Management and Medication Current Pain Management: Electronic Signature(s) Signed: 08/03/2020 5:28:50 PM By: Levan Hurst RN, BSN Entered By: Levan Hurst on 08/03/2020 14:06:51 -------------------------------------------------------------------------------- Patient/Caregiver Education Details Patient Name: Date of Service: [facility] [name], [place] [name] 1/13/2022andnbsp1:30 PM Medical Record Number: [id number] Patient Account Number: [id number] Date of Birth/Gender: Treating RN: 1950-11-23 (70 y.o. [name] Primary Care Physician: [name] Other Clinician: Referring Physician: Treating Physician/Extender: Gerhard Perches,  Junie Spencer in Treatment: 25 Education Assessment Education Provided To: Patient Education Topics Provided Wound/Skin Impairment: Handouts: Caring for Your Ulcer Methods: Explain/Verbal Responses: Reinforcements needed Electronic  Signature(s) Signed: 08/03/2020 5:39:22 PM By: [name] Entered By: [name] on 08/03/2020 13:43:25 -------------------------------------------------------------------------------- Wound Assessment Details Patient Name: Date of Service: [name], [place] [name] 08/03/2020 1:30 PM Medical Record Number: [id number] Patient Account Number: [id number] Date of Birth/Sex: Treating RN: 1951-07-19 (70 y.o. [name] Primary Care Vaanya Shambaugh: [name] Other Clinician: Referring Darral Rishel: Treating Shanara Schnieders/Extender: Rosezena Sensor in Treatment: 25 Wound Status Wound Number: 19 Primary Diabetic Wound/Ulcer of the Lower Extremity Etiology: Wound Location: Right T Third oe Wound Open Wounding Event: Footwear Injury Status: Date Acquired: 06/25/2020 Comorbid Cataracts, Anemia, Asthma, Hypertension, Type II Diabetes, End Weeks Of Treatment: 5 History: Stage Renal Disease, Osteomyelitis, Neuropathy Clustered Wound: No Wound Measurements Length: (cm) 1.1 Width: (cm) 1 Depth: (cm) 0.1 Area: (cm) 0.864 Volume: (cm) 0.086 % Reduction in Area: -214.2% % Reduction in Volume: -218.5% Epithelialization: None Tunneling: No Undermining: No Wound Description Classification: Grade 2 Wound Margin: Distinct, outline attached Exudate Amount: Small Exudate Type: Serosanguineous Exudate Color: red, brown Foul Odor After Cleansing: No Slough/Fibrino No Wound Bed Granulation Amount: Medium (34-66%) Exposed Structure Granulation Quality: Pink Fascia Exposed: No Necrotic Amount: Medium (34-66%) Fat Layer (Subcutaneous Tissue) Exposed: Yes Necrotic Quality: Adherent Slough Tendon Exposed: No Muscle Exposed: No Joint Exposed: No Bone Exposed: Yes Treatment Notes Wound #19 (Toe Third) Wound Laterality: Right Cleanser Soap and Water Discharge Instruction: May shower and wash wound with dial antibacterial soap and water prior to dressing change. Peri-Wound  Care Topical Primary Dressing KerraCel Ag Gelling Fiber Dressing, 4x5 in (silver alginate) Discharge Instruction: Apply silver alginate to wound bed as instructed Secondary Dressing Woven Gauze Sponges 2x2 in Discharge Instruction: Apply over primary dressing as directed. Secured With Conforming Stretch Gauze Bandage, Sterile 2x75 (in/in) Discharge Instruction: Secure with stretch gauze as directed. Compression Wrap Compression Stockings Add-Ons Electronic Signature(s) Signed: 08/03/2020 5:28:50 PM By: Levan Hurst RN, BSN Entered By: Levan Hurst on 08/03/2020 14:07:57 -------------------------------------------------------------------------------- Wound Assessment Details Patient Name: Date of Service: [name], [place] [name] 08/03/2020 1:30 PM Medical Record Number: [id number] Patient Account Number: [id number] Date of Birth/Sex: Treating RN: 08-19-1950 (70 y.o. [name] Primary Care Tobey Schmelzle: [name] Other Clinician: Referring Jaionna Weisse: Treating Michella Detjen/Extender: Rosezena Sensor in Treatment: 25 Wound Status Wound Number: 8 Primary Diabetic Wound/Ulcer of the Lower Extremity Etiology: Wound Location: Left T Third oe Wound Open Wounding Event: Blister Status: Date Acquired: 02/23/2020 Comorbid Cataracts, Anemia, Asthma, Hypertension, Type II Diabetes, End Weeks Of Treatment: 23 History: Stage Renal Disease, Osteomyelitis, Neuropathy Clustered Wound: No Wound Measurements Length: (cm) 0.3 Width: (cm) 0.3 Depth: (cm) 0.1 Area: (cm) 0.071 Volume: (cm) 0.007 % Reduction in Area: 49.6% % Reduction in Volume: 50% Epithelialization: Medium (34-66%) Tunneling: No Undermining: No Wound Description Classification: Grade 2 Wound Margin: Flat and Intact Exudate Amount: Small Exudate Type: Serosanguineous Exudate Color: red, brown Foul Odor After Cleansing: No Slough/Fibrino Yes Wound Bed Granulation Amount: Large (67-100%)  Exposed Structure Granulation Quality: Red Fascia Exposed: No Necrotic Amount: Small (1-33%) Fat Layer (Subcutaneous Tissue) Exposed: Yes Necrotic Quality: Adherent Slough Tendon Exposed: No Muscle Exposed: No Joint Exposed: No Bone Exposed: No Treatment Notes Wound #8 (Toe Third) Wound Laterality: Left Cleanser Soap and Water Discharge Instruction: May shower and wash wound with dial antibacterial soap and water prior to dressing change. Peri-Wound Care Topical Primary  Dressing KerraCel Ag Gelling Fiber Dressing, 4x5 in (silver alginate) Discharge Instruction: Apply silver alginate to wound bed as instructed Secondary Dressing Woven Gauze Sponges 2x2 in Discharge Instruction: Apply over primary dressing as directed. Secured With Conforming Stretch Gauze Bandage, Sterile 2x75 (in/in) Discharge Instruction: Secure with stretch gauze as directed. Compression Wrap Compression Stockings Add-Ons Electronic Signature(s) Signed: 08/03/2020 5:28:50 PM By: Levan Hurst RN, BSN Entered By: Levan Hurst on 08/03/2020 14:08:09 -------------------------------------------------------------------------------- [place] Details Patient Name: Date of Service: [name], [place] [name] 08/03/2020 1:30 PM Medical Record Number: [id number] Patient Account Number: [id number] Date of Birth/Sex: Treating RN: 07/09/1951 (70 y.o. [name] Primary Care Graci Hulce: [name] Other Clinician: Referring Elis Sauber: Treating Lamond Glantz/Extender: Rosezena Sensor in Treatment: 25 Vital Signs Time Taken: 14:00 Temperature (F): 98.3 Height (in): 67 Pulse (bpm): 104 Weight (lbs): 202 Respiratory Rate (breaths/min): 16 Body Mass Index (BMI): 31.6 Blood Pressure (mmHg): 100/53 Capillary Blood Glucose (mg/dl): 142 Reference Range: 80 - 120 mg / dl Notes glucose per pt report Electronic Signature(s) Signed: 08/03/2020 5:28:50 PM By: Levan Hurst RN, BSN Entered By: Levan Hurst on 08/03/2020 14:06:45

## 2020-08-03 NOTE — Progress Notes (Signed)
[name] ([id number]) Visit Report for 08/03/2020 HPI Details Patient Name: Date of Service: [name], [name] 08/03/2020 1:30 PM Medical Record Number: [id number] Patient Account Number: [id number] Date of Birth/Sex: Treating RN: 06-30-1951 (70 y.o. [name] Primary Care Provider: Jenna [name] Other Clinician: Referring Provider: Treating Provider/Extender: [name] in Treatment: 25 History of Present Illness HPI Description: Evaluate7/21/2021 today patient presents for evaluation of ulcerations on her legs which she tells me tend to come and go as far as small blistering locations and sometimes are better than other times. Right now she tells me that this is actually doing a little bit better but nonetheless will not completely go away. She has ulcerations bilaterally on her lower extremities. The right is worse than the left. 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 The patient has no evidence of active infection at this time. She however appears to have proficient blood flow into the extremities and I think would tolerate a 3 layer compression wrap she was noncompressible on arterial studies. Nonetheless there was no obvious signs of occlusion. 02/16/2020 on evaluation today patient appears to be doing much better in regard to her wounds in general. On the past week she has made significant improvements which is great news there is no signs of active infection at this time. No fevers, chills, nausea, vomiting, or diarrhea. 02/23/2020 on evaluation today patient appears to be doing well in regard to her lower extremity ulcers and ankle ulcers. Overall I feel like she is making great progress and in general I am extremely pleased with how things stand. There is no sign of active infection at this time which is also good news. She will  require slight debridement around the right ankle region. 03/02/20-Patient returns at 1 week, has few new areas open on the left leg, especially 1 healed area on the medial ankle that has slight small open area now, to anterior leg areas that started out as blisters that are open now. She was using juxta lite compression to the left, she used Band-Aid to cover the vesicle that had ruptured on the left anterior leg, we are using 3 layer compression on the right. We are using silver alginate to the toe wound on the left. 03/08/2020 upon evaluation today patient unfortunately is doing much worse in regard to her right leg although her left leg looks like is doing better. Again we have taken her compression wraps off last week since she was doing well and transitioned her to her Velcro compression wrap. However apparently the patient is stating this was not put on here in the clinic and she never put it on at home. With that being said unfortunately she developed swelling and opened up new wounds which are present today she has quite a few of them in fact. 03/15/2020 on evaluation today patient appears to be doing well with regard to her wounds on the lower extremities bilaterally. Fortunately there is no signs of active infection at this time. No fever chills noted. Overall I am actually very pleased with where things stand currently. 03/22/2020 on evaluation today patient appears to be doing fairly well in regard to her wounds in general. She just has really one area left on her toe everything else is healed on her legs. She does have her Velcro compression wraps which I think are appropriate at this point. Fortunately  there is no signs of active infection at this time. No fevers, chills, nausea, vomiting, or diarrhea. 03/29/2020 on evaluation today patient appears to be doing poorly in regard to her right leg which is pretty much completely reopened compared to last week. She states it was okay until Saturday  when she woke up she had blisters. With that being said it appears that this is likely stemming from the fact that the patient is apparently though I just found this out today going to sleep in her bed with her legs up on the bed with her but throughout the night she tends to end up with her legs hanging off the side or bottom of the bed on the floor. With that being said I think that this likely is happening much too long and then she has significant swelling the builds up as she sleeps. Obviously I think this is the reasoning behind what we are seeing currently. 04/05/2020 on evaluation today patient appears to be doing quite well with regard to her leg ulcers at this point. Fortunately there is no signs of anything significantly open again which is great news nonetheless I am to continue wrapping her for at least a couple of weeks due to the fact that she seems to continually reopen as soon as we unwrapped her. 04/12/2020 upon evaluation today patient appears to be doing excellent in regard to her leg ulcers all appear to be improving which is great news. She is also doing better in regard to the toe the infection seems to be under better control and the Santyl is helping to clean this up. Overall very pleased much more so than what I saw last week. 04/19/2020 on evaluation today patient appears to be doing well with regard to her leg ulcers which are showing no signs of opening at this point. Her toe is also showing signs of improvement she is about done with her antibiotic. Overall I feel like things are doing quite well. 04/26/2020 upon evaluation today patient appears to be doing unfortunately a little bit worse compared to previous. She has reopened wounds on the right anterior lower extremity. This is unfortunate as we were hoping that this would really maintain healing. That does not appear to be the case. I think with him the need to see about making a referral for venous studies and provider  evaluation with [facility] imaging with the interventional radiologist. 08/11/2019 upon evaluation today patient appears to be doing well in general in regard to her legs. Her right leg she does have a new wound that opened since last week. On the left leg everything appears to be healed she does still have the opening on her toe which we are managing but again this does not appear to be any worse. She just needs to get this to feeling and cover over. There is no evidence of infection which is at least great news. 05/17/2020 upon evaluation today patient appears to be doing decently well in regard to her leg ulcers this is good news. She does have still an opening on the left second toe. She has been let this dry out that not really what we want to see currently. For that reason I discussed with her again the fact that she needs to keep this covered and not allow it to dry out in order to allow for appropriate healing. She voiced understanding and states she did not know that. 05/24/2020 upon evaluation today patient's legs both appear to be still  be completely healed which is great news. We are now going to transition into having her utilize her juxta lite compression wraps on the bilateral lower extremities. She is definitely ready for this. Her toe also seems to be doing better. 05/31/2020 on evaluation today patient appears to be doing well with regard to her legs. There is nothing open at this point. Her toe still has some slough noted and get a clean that off today by way of sharp debridement other than that I feel like the collagen is probably still her best bet in that regard. 06/07/2020 upon evaluation today patient appears to be doing well with regard to her left leg and her toe ulcer the toe seems to be doing better the leg is doing okay. The right leg unfortunately she has new wounds I think her shoes are rubbing on the side of her ankle which is what is causing part of this issue. Fortunately  there is no signs of active infection at this time which is great news. 06/14/2020 upon evaluation today patient appears to be doing well today in regard to most of her wounds. Everything seems to be showing some signs of improvement or else healing. Fortunately there is no signs of active infection at this time. No fevers, chills, nausea, vomiting, or diarrhea. She has been using a silver collagen for her toe ulcer. Damage12/07/2019 on evaluation today patient appears to be doing okay in regard to her right medial ankle ulcer in her left toe ulcer. Both are a little bit better today visually but again she still is not doing as well as I like to see. She still continues to wear the worn-out 70 year old crocs which are not good for her to be honest I think it is causing and preventing healing. Fortunately there is no signs of active infection at this time. No fevers, chills, nausea, vomiting, or diarrhea. 06/28/20 upon evaluation today patient appears to be doing poorly in regard to her second toes bilaterally. These both appear to be rubbing on her shoes these are shoes that have asked him to get rid of every visit since have been seeing her. I had a much more lengthy conversation with her about it last week she told me she was going to get different shoes to wear I get she comes in with the same shoes on today. Nonetheless I think this is part of her problem. Obviously the left second toe appears to be infected at this point. The right necessarily see evidence of this but nonetheless is definitely been rubbing where it sticks up. 07/05/2020 on evaluation today patient's toe ulcers appear to be doing really about the same the left toe may be a little better in the right toe a little worse but overall not much change. Fortunately there is no signs of active infection at this time. No fevers, chills, nausea, vomiting, or diarrhea. 07/27/2020. Patient I do not usually see. She has wounds on her dorsal  bilateral third toes. 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 She comes in today using Santyl on the wounds. The left third toe is red and swollen right third toe with thick eschar over the wound surface. 1/13; patient has wounds on her bilateral third toes PIP. The area on the right has exposed bone. The area on the left this week does not. I put her on doxycycline empirically last  week because of the appearance of the right third toe. The toe seems less swollen and red today but she did not get x-rays Electronic Signature(s) Signed: 08/03/2020 5:01:02 PM By: Linton Ham MD Entered By: Linton Ham on 08/03/2020 14:51:17 -------------------------------------------------------------------------------- Physical Exam Details Patient Name: Date of Service: JO [name], [place] [name] 08/03/2020 1:30 PM Medical Record Number: [id number] Patient Account Number: [id number] Date of Birth/Sex: Treating RN: 1951/05/12 (70 y.o. [name] Primary Care Provider: Jenna [name] Other Clinician: Referring Provider: Treating Provider/Extender: [name] in Treatment: 25 Cardiovascular Faint dorsalis pedis pulses. Musculoskeletal No tenderness over the third toe bilaterally. Notes Wound exam; dorsal third toes bilaterally. The area on the right probes to bone today but in the area on the left does not which is an improvement. The condition of the right third toe also looks better in terms of swelling and erythema as well as erythema of the left third toe. She is insensate and does not feel pain Electronic Signature(s) Signed: 08/03/2020 5:01:02 PM By: Linton Ham MD Entered By: Linton Ham on 08/03/2020 14:52:26 -------------------------------------------------------------------------------- Physician Orders Details Patient Name: Date of Service: [facility] [name], [place]  [name] 08/03/2020 1:30 PM Medical Record Number: [id number] Patient Account Number: [id number] Date of Birth/Sex: Treating RN: [birth year] (70 y.o. [name], [place].Reding Primary Care Provider: Jenna [name] Other Clinician: Referring Provider: Treating Provider/Extender: [name] in Treatment: 25 Verbal / Phone Orders: No Diagnosis Coding ICD-10 Coding Code Description I87.2 Venous insufficiency (chronic) (peripheral) E11.621 Type 2 diabetes mellitus with foot ulcer L97.522 Non-pressure chronic ulcer of other part of left foot with fat layer exposed L97.512 Non-pressure chronic ulcer of other part of right foot with fat layer exposed N18.6 End stage renal disease Z99.2 Dependence on renal dialysis I10 Essential (primary) hypertension Follow-up Appointments Return Appointment in 1 week. Bathing/ Shower/ Hygiene May shower and wash wound with soap and water. Edema Control - Lymphedema / SCD / Other Bilateral Lower Extremities Elevate legs to the level of the heart or above for 30 minutes daily and/or when sitting, a frequency of: Avoid standing for long periods of time. Exercise regularly Moisturize legs daily. Compression stocking or Garment 20-30 mm/Hg pressure to: - juxtalites to both legs daily, apply in the morning and remove at night Additional Orders / Instructions Other: - ***Please get the x-rays done of both feet.*** Wound Treatment Wound #19 - T Third oe Wound Laterality: Right Cleanser: Soap and Water Every Other Day/30 Days Discharge Instructions: May shower and wash wound with dial antibacterial soap and water prior to dressing change. Prim Dressing: KerraCel Ag Gelling Fiber Dressing, 4x5 in (silver alginate) Every Other Day/30 Days ary Discharge Instructions: Apply silver alginate to wound bed as instructed Secondary Dressing: Woven Gauze Sponges 2x2 in Every Other Day/30 Days Discharge Instructions: Apply over primary dressing as  directed. Secured With: Child psychotherapist, Sterile 2x75 (in/in) Every Other Day/30 Days Discharge Instructions: Secure with stretch gauze as directed. Wound #8 - T Third oe Wound Laterality: Left Cleanser: Soap and Water Every Other Day/30 Days Discharge Instructions: May shower and wash wound with dial antibacterial soap and water prior to dressing change. Prim Dressing: KerraCel Ag Gelling Fiber Dressing, 4x5 in (silver alginate) Every Other Day/30 Days ary Discharge Instructions: Apply silver alginate to wound bed as instructed Secondary Dressing: Woven Gauze Sponges 2x2 in Every Other Day/30 Days Discharge Instructions: Apply over primary dressing as directed. Secured With: Child psychotherapist, Sterile 2x75 (in/in) Every  Other Day/30 Days Discharge Instructions: Secure with stretch gauze as directed. Patient Medications llergies: penicillin, adhesive tape A Notifications Medication Indication Start End wound infection 08/03/2020 doxycycline monohydrate DOSE oral 100 mg capsule - 1 capsule oral bid for an additional 7 days Electronic Signature(s) Signed: 08/03/2020 2:55:29 PM By: Linton Ham MD Entered By: Linton Ham on 08/03/2020 14:55:27 -------------------------------------------------------------------------------- Problem List Details Patient Name: Date of Service: [facility] [name], [place] [name] 08/03/2020 1:30 PM Medical Record Number: [id number] Patient Account Number: [id number] Date of Birth/Sex: Treating RN: 01/05/1951 (70 y.o. [name], Meta.Reding Primary Care Provider: Jenna [name] Other Clinician: Referring Provider: Treating Provider/Extender: [name] in Treatment: 25 Active Problems ICD-10 Encounter Code Description Active Date MDM Diagnosis I87.2 Venous insufficiency (chronic) (peripheral) 02/09/2020 No Yes E11.621 Type 2 diabetes mellitus with foot ulcer 02/09/2020 No Yes L97.522 Non-pressure chronic ulcer  of other part of left foot with fat layer exposed 05/31/2020 No Yes L97.512 Non-pressure chronic ulcer of other part of right foot with fat layer exposed 07/05/2020 No Yes N18.6 End stage renal disease 02/09/2020 No Yes Z99.2 Dependence on renal dialysis 02/09/2020 No Yes I10 Essential (primary) hypertension 02/09/2020 No Yes Inactive Problems Resolved Problems ICD-10 Code Description Active Date Resolved Date L97.822 Non-pressure chronic ulcer of other part of left lower leg with fat layer exposed 02/09/2020 02/09/2020 L97.312 Non-pressure chronic ulcer of right ankle with fat layer exposed 02/09/2020 02/09/2020 L97.812 Non-pressure chronic ulcer of other part of right lower leg with fat layer exposed 02/09/2020 02/09/2020 L97.512 Non-pressure chronic ulcer of other part of right foot with fat layer exposed 02/09/2020 02/09/2020 Electronic Signature(s) Signed: 08/03/2020 5:01:02 PM By: Linton Ham MD Entered By: Linton Ham on 08/03/2020 14:49:21 -------------------------------------------------------------------------------- Progress Note Details Patient Name: Date of Service: JO [name], [place] [name] 08/03/2020 1:30 PM Medical Record Number: [id number] Patient Account Number: [id number] Date of Birth/Sex: Treating RN: [birth year] (70 y.o. [name] Primary Care Provider: Jenna [name] Other Clinician: Referring Provider: Treating Provider/Extender: [name] in Treatment: 25 Subjective History of Present Illness (HPI) Evaluate7/21/2021 today patient presents for evaluation of ulcerations on her legs which she tells me tend to come and go as far as small blistering locations and sometimes are better than other times. Right now she tells me that this is actually doing a little bit better but nonetheless will not completely go away. She has ulcerations bilaterally on her lower extremities. The right is worse than the left. 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 The patient has no evidence of active infection at this time. She however appears to have proficient blood flow into the extremities and I think would tolerate a 3 layer compression wrap she was noncompressible on arterial studies. Nonetheless there was no obvious signs of occlusion. 02/16/2020 on evaluation today patient appears to be doing much better in regard to her wounds in general. On the past week she has made significant improvements which is great news there is no signs of active infection at this time. No fevers, chills, nausea, vomiting, or diarrhea. 02/23/2020 on evaluation today patient appears to be doing well in regard to her lower extremity ulcers and ankle ulcers. Overall I feel like she is making great progress and in general I am extremely pleased with how things stand. There is no sign of active infection at this time which is also good news. She will require slight  debridement around the right ankle region. 03/02/20-Patient returns at 1 week, has few new areas open on the left leg, especially 1 healed area on the medial ankle that has slight small open area now, to anterior leg areas that started out as blisters that are open now. She was using juxta lite compression to the left, she used Band-Aid to cover the vesicle that had ruptured on the left anterior leg, we are using 3 layer compression on the right. We are using silver alginate to the toe wound on the left. 03/08/2020 upon evaluation today patient unfortunately is doing much worse in regard to her right leg although her left leg looks like is doing better. Again we have taken her compression wraps off last week since she was doing well and transitioned her to her Velcro compression wrap. However apparently the patient is stating this was not put on here in the clinic and she never put it on at home. With that  being said unfortunately she developed swelling and opened up new wounds which are present today she has quite a few of them in fact. 03/15/2020 on evaluation today patient appears to be doing well with regard to her wounds on the lower extremities bilaterally. Fortunately there is no signs of active infection at this time. No fever chills noted. Overall I am actually very pleased with where things stand currently. 03/22/2020 on evaluation today patient appears to be doing fairly well in regard to her wounds in general. She just has really one area left on her toe everything else is healed on her legs. She does have her Velcro compression wraps which I think are appropriate at this point. Fortunately there is no signs of active infection at this time. No fevers, chills, nausea, vomiting, or diarrhea. 03/29/2020 on evaluation today patient appears to be doing poorly in regard to her right leg which is pretty much completely reopened compared to last week. She states it was okay until Saturday when she woke up she had blisters. With that being said it appears that this is likely stemming from the fact that the patient is apparently though I just found this out today going to sleep in her bed with her legs up on the bed with her but throughout the night she tends to end up with her legs hanging off the side or bottom of the bed on the floor. With that being said I think that this likely is happening much too long and then she has significant swelling the builds up as she sleeps. Obviously I think this is the reasoning behind what we are seeing currently. 04/05/2020 on evaluation today patient appears to be doing quite well with regard to her leg ulcers at this point. Fortunately there is no signs of anything significantly open again which is great news nonetheless I am to continue wrapping her for at least a couple of weeks due to the fact that she seems to continually reopen as soon as we unwrapped  her. 04/12/2020 upon evaluation today patient appears to be doing excellent in regard to her leg ulcers all appear to be improving which is great news. She is also doing better in regard to the toe the infection seems to be under better control and the Santyl is helping to clean this up. Overall very pleased much more so than what I saw last week. 04/19/2020 on evaluation today patient appears to be doing well with regard to her leg ulcers which are showing no signs of  opening at this point. Her toe is also showing signs of improvement she is about done with her antibiotic. Overall I feel like things are doing quite well. 04/26/2020 upon evaluation today patient appears to be doing unfortunately a little bit worse compared to previous. She has reopened wounds on the right anterior lower extremity. This is unfortunate as we were hoping that this would really maintain healing. That does not appear to be the case. I think with him the need to see about making a referral for venous studies and provider evaluation with [facility] imaging with the interventional radiologist. 08/11/2019 upon evaluation today patient appears to be doing well in general in regard to her legs. Her right leg she does have a new wound that opened since last week. On the left leg everything appears to be healed she does still have the opening on her toe which we are managing but again this does not appear to be any worse. She just needs to get this to feeling and cover over. There is no evidence of infection which is at least great news. 05/17/2020 upon evaluation today patient appears to be doing decently well in regard to her leg ulcers this is good news. She does have still an opening on the left second toe. She has been let this dry out that not really what we want to see currently. For that reason I discussed with her again the fact that she needs to keep this covered and not allow it to dry out in order to allow for appropriate  healing. She voiced understanding and states she did not know that. 05/24/2020 upon evaluation today patient's legs both appear to be still be completely healed which is great news. We are now going to transition into having her utilize her juxta lite compression wraps on the bilateral lower extremities. She is definitely ready for this. Her toe also seems to be doing better. 05/31/2020 on evaluation today patient appears to be doing well with regard to her legs. There is nothing open at this point. Her toe still has some slough noted and get a clean that off today by way of sharp debridement other than that I feel like the collagen is probably still her best bet in that regard. 06/07/2020 upon evaluation today patient appears to be doing well with regard to her left leg and her toe ulcer the toe seems to be doing better the leg is doing okay. The right leg unfortunately she has new wounds I think her shoes are rubbing on the side of her ankle which is what is causing part of this issue. Fortunately there is no signs of active infection at this time which is great news. 06/14/2020 upon evaluation today patient appears to be doing well today in regard to most of her wounds. Everything seems to be showing some signs of improvement or else healing. Fortunately there is no signs of active infection at this time. No fevers, chills, nausea, vomiting, or diarrhea. She has been using a silver collagen for her toe ulcer. Damage12/07/2019 on evaluation today patient appears to be doing okay in regard to her right medial ankle ulcer in her left toe ulcer. Both are a little bit better today visually but again she still is not doing as well as I like to see. She still continues to wear the worn-out 70 year old crocs which are not good for her to be honest I think it is causing and preventing healing. Fortunately there is no signs of active  infection at this time. No fevers, chills, nausea, vomiting,  or diarrhea. 06/28/20 upon evaluation today patient appears to be doing poorly in regard to her second toes bilaterally. These both appear to be rubbing on her shoes these are shoes that have asked him to get rid of every visit since have been seeing her. I had a much more lengthy conversation with her about it last week she told me she was going to get different shoes to wear I get she comes in with the same shoes on today. Nonetheless I think this is part of her problem. Obviously the left second toe appears to be infected at this point. The right necessarily see evidence of this but nonetheless is definitely been rubbing where it sticks up. 07/05/2020 on evaluation today patient's toe ulcers appear to be doing really about the same the left toe may be a little better in the right toe a little worse but overall not much change. Fortunately there is no signs of active infection at this time. No fevers, chills, nausea, vomiting, or diarrhea. 07/27/2020. Patient I do not usually see. She has wounds on her dorsal bilateral third toes. 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 She comes in today using Santyl on the wounds. The left third toe is red and swollen right third toe with thick eschar over the wound surface. 1/13; patient has wounds on her bilateral third toes PIP. The area on the right has exposed bone. The area on the left this week does not. I put her on doxycycline empirically last week because of the appearance of the right third toe. The toe seems less swollen and red today but she did not get x-rays Objective Constitutional Vitals Time Taken: 2:00 PM, Height: 67 in, Weight: 202 lbs, BMI: 31.6, Temperature: 98.3 F, Pulse: 104 bpm, Respiratory Rate: 16 breaths/min, Blood Pressure: 100/53 mmHg, Capillary Blood Glucose: 142 mg/dl. General Notes: glucose per pt  report Cardiovascular Faint dorsalis pedis pulses. Musculoskeletal No tenderness over the third toe bilaterally. General Notes: Wound exam; dorsal third toes bilaterally. The area on the right probes to bone today but in the area on the left does not which is an improvement. The condition of the right third toe also looks better in terms of swelling and erythema as well as erythema of the left third toe. She is insensate and does not feel pain Integumentary (Hair, Skin) Wound #19 status is Open. Original cause of wound was Footwear Injury. The wound is located on the Right T Third. The wound measures 1.1cm length x oe 1cm width x 0.1cm depth; 0.864cm^2 area and 0.086cm^3 volume. There is bone and Fat Layer (Subcutaneous Tissue) exposed. There is no tunneling or undermining noted. There is a small amount of serosanguineous drainage noted. The wound margin is distinct with the outline attached to the wound base. There is medium (34-66%) pink granulation within the wound bed. There is a medium (34-66%) amount of necrotic tissue within the wound bed including Adherent Slough. Wound #8 status is Open. Original cause of wound was Blister. The wound is located on the Left T Third. The wound measures 0.3cm length x 0.3cm width x oe 0.1cm depth; 0.071cm^2 area and 0.007cm^3 volume. There is Fat Layer (Subcutaneous Tissue) exposed. There is no tunneling or undermining noted. There is a small amount of serosanguineous drainage noted. The wound margin is flat and intact.  There is large (67-100%) red granulation within the wound bed. There is a small (1-33%) amount of necrotic tissue within the wound bed including Adherent Slough. Assessment Active Problems ICD-10 Venous insufficiency (chronic) (peripheral) Type 2 diabetes mellitus with foot ulcer Non-pressure chronic ulcer of other part of left foot with fat layer exposed Non-pressure chronic ulcer of other part of right foot with fat layer  exposed End stage renal disease Dependence on renal dialysis Essential (primary) hypertension Plan Follow-up Appointments: Return Appointment in 1 week. Bathing/ Shower/ Hygiene: May shower and wash wound with soap and water. Edema Control - Lymphedema / SCD / Other: Elevate legs to the level of the heart or above for 30 minutes daily and/or when sitting, a frequency of: Avoid standing for long periods of time. Exercise regularly Moisturize legs daily. Compression stocking or Garment 20-30 mm/Hg pressure to: - juxtalites to both legs daily, apply in the morning and remove at night Additional Orders / Instructions: Other: - ***Please get the x-rays done of both feet.*** The following medication(s) was prescribed: doxycycline monohydrate oral 100 mg capsule 1 capsule oral bid for an additional 7 days for wound infection starting 08/03/2020 WOUND #19: - T Third Wound Laterality: Right oe Cleanser: Soap and Water Every Other Day/30 Days Discharge Instructions: May shower and wash wound with dial antibacterial soap and water prior to dressing change. Prim Dressing: KerraCel Ag Gelling Fiber Dressing, 4x5 in (silver alginate) Every Other Day/30 Days ary Discharge Instructions: Apply silver alginate to wound bed as instructed Secondary Dressing: Woven Gauze Sponges 2x2 in Every Other Day/30 Days Discharge Instructions: Apply over primary dressing as directed. Secured With: Child psychotherapist, Sterile 2x75 (in/in) Every Other Day/30 Days Discharge Instructions: Secure with stretch gauze as directed. WOUND #8: - T Third Wound Laterality: Left oe Cleanser: Soap and Water Every Other Day/30 Days Discharge Instructions: May shower and wash wound with dial antibacterial soap and water prior to dressing change. Prim Dressing: KerraCel Ag Gelling Fiber Dressing, 4x5 in (silver alginate) Every Other Day/30 Days ary Discharge Instructions: Apply silver alginate to wound bed as  instructed Secondary Dressing: Woven Gauze Sponges 2x2 in Every Other Day/30 Days Discharge Instructions: Apply over primary dressing as directed. Secured With: Child psychotherapist, Sterile 2x75 (in/in) Every Other Day/30 Days Discharge Instructions: Secure with stretch gauze as directed. 1. No change in the dressing 2. I have extended the empiric doxycycline because of the improved erythema and edema in both the left and right third toes 3. We still have exposed bone on the right but not the left. The latter is quite a bit of improvement from last week. 4. I have asked her to go ahead with these x-rays Electronic Signature(s) Signed: 08/03/2020 5:01:02 PM By: Linton Ham MD Entered By: Linton Ham on 08/03/2020 14:56:15 -------------------------------------------------------------------------------- SuperBill Details Patient Name: Date of Service: JO [name], [place] [name] 08/03/2020 Medical Record Number: [id number] Patient Account Number: [id number] Date of Birth/Sex: Treating RN: 06/30/1951 (70 y.o. [name], [name] Primary Care Provider: Jenna [name] Other Clinician: Referring Provider: Treating Provider/Extender: [name] in Treatment: 25 Diagnosis Coding ICD-10 Codes Code Description I87.2 Venous insufficiency (chronic) (peripheral) E11.621 Type 2 diabetes mellitus with foot ulcer L97.522 Non-pressure chronic ulcer of other part of left foot with fat layer exposed L97.512 Non-pressure chronic ulcer of other part of right foot with fat layer exposed N18.6 End stage renal disease Z99.2 Dependence on renal dialysis I10 Essential (primary) hypertension Facility Procedures CPT4 Code: [id number] Description:  09030 - WOUND CARE VISIT-LEV 4 EST PT Modifier: Quantity: 1 Physician Procedures : CPT4 Code Description Modifier [id number] - WC PHYS LEVEL 3 - EST PT 1 ICD-10 Diagnosis Description L97.522 Non-pressure chronic ulcer of other  part of left foot with fat layer exposed L97.512 Non-pressure chronic ulcer of other part of right foot  with fat layer exposed E11.621 Type 2 diabetes mellitus with foot ulcer Quantity: Electronic Signature(s) Signed: 08/03/2020 5:01:02 PM By: Linton Ham MD Entered By: Linton Ham on 08/03/2020 14:56:36

## 2020-08-05 DIAGNOSIS — D631 Anemia in chronic kidney disease: Secondary | ICD-10-CM | POA: Diagnosis not present

## 2020-08-05 DIAGNOSIS — D509 Iron deficiency anemia, unspecified: Secondary | ICD-10-CM | POA: Diagnosis not present

## 2020-08-05 DIAGNOSIS — N186 End stage renal disease: Secondary | ICD-10-CM | POA: Diagnosis not present

## 2020-08-05 DIAGNOSIS — N2581 Secondary hyperparathyroidism of renal origin: Secondary | ICD-10-CM | POA: Diagnosis not present

## 2020-08-05 DIAGNOSIS — R197 Diarrhea, unspecified: Secondary | ICD-10-CM | POA: Diagnosis not present

## 2020-08-05 DIAGNOSIS — D689 Coagulation defect, unspecified: Secondary | ICD-10-CM | POA: Diagnosis not present

## 2020-08-05 DIAGNOSIS — E1129 Type 2 diabetes mellitus with other diabetic kidney complication: Secondary | ICD-10-CM | POA: Diagnosis not present

## 2020-08-05 DIAGNOSIS — Z992 Dependence on renal dialysis: Secondary | ICD-10-CM | POA: Diagnosis not present

## 2020-08-08 ENCOUNTER — Other Ambulatory Visit: Payer: Self-pay | Admitting: Family Medicine

## 2020-08-08 DIAGNOSIS — J4540 Moderate persistent asthma, uncomplicated: Secondary | ICD-10-CM

## 2020-08-09 DIAGNOSIS — E1129 Type 2 diabetes mellitus with other diabetic kidney complication: Secondary | ICD-10-CM | POA: Diagnosis not present

## 2020-08-09 DIAGNOSIS — Z992 Dependence on renal dialysis: Secondary | ICD-10-CM | POA: Diagnosis not present

## 2020-08-09 DIAGNOSIS — N186 End stage renal disease: Secondary | ICD-10-CM | POA: Diagnosis not present

## 2020-08-09 DIAGNOSIS — N2581 Secondary hyperparathyroidism of renal origin: Secondary | ICD-10-CM | POA: Diagnosis not present

## 2020-08-09 DIAGNOSIS — R197 Diarrhea, unspecified: Secondary | ICD-10-CM | POA: Diagnosis not present

## 2020-08-09 DIAGNOSIS — D631 Anemia in chronic kidney disease: Secondary | ICD-10-CM | POA: Diagnosis not present

## 2020-08-09 DIAGNOSIS — D509 Iron deficiency anemia, unspecified: Secondary | ICD-10-CM | POA: Diagnosis not present

## 2020-08-09 DIAGNOSIS — D689 Coagulation defect, unspecified: Secondary | ICD-10-CM | POA: Diagnosis not present

## 2020-08-10 ENCOUNTER — Ambulatory Visit (HOSPITAL_COMMUNITY)
Admission: RE | Admit: 2020-08-10 | Discharge: 2020-08-10 | Disposition: A | Discharge status: Home / Self Care | Payer: Medicare Other | Source: Ambulatory Visit | Attending: Internal Medicine | Admitting: Internal Medicine

## 2020-08-10 ENCOUNTER — Other Ambulatory Visit: Payer: Self-pay

## 2020-08-10 ENCOUNTER — Other Ambulatory Visit (HOSPITAL_COMMUNITY): Payer: Self-pay | Admitting: Internal Medicine

## 2020-08-10 ENCOUNTER — Encounter (HOSPITAL_BASED_OUTPATIENT_CLINIC_OR_DEPARTMENT_OTHER): Payer: Medicare Other | Admitting: Internal Medicine

## 2020-08-10 DIAGNOSIS — M19072 Primary osteoarthritis, left ankle and foot: Secondary | ICD-10-CM | POA: Diagnosis not present

## 2020-08-10 DIAGNOSIS — L97512 Non-pressure chronic ulcer of other part of right foot with fat layer exposed: Secondary | ICD-10-CM | POA: Diagnosis not present

## 2020-08-10 DIAGNOSIS — S91302A Unspecified open wound, left foot, initial encounter: Secondary | ICD-10-CM | POA: Diagnosis not present

## 2020-08-10 DIAGNOSIS — Z89421 Acquired absence of other right toe(s): Secondary | ICD-10-CM | POA: Diagnosis not present

## 2020-08-10 DIAGNOSIS — Z89422 Acquired absence of other left toe(s): Secondary | ICD-10-CM | POA: Diagnosis not present

## 2020-08-10 DIAGNOSIS — D689 Coagulation defect, unspecified: Secondary | ICD-10-CM | POA: Diagnosis not present

## 2020-08-10 DIAGNOSIS — Z880 Allergy status to penicillin: Secondary | ICD-10-CM | POA: Diagnosis not present

## 2020-08-10 DIAGNOSIS — S81801A Unspecified open wound, right lower leg, initial encounter: Secondary | ICD-10-CM | POA: Insufficient documentation

## 2020-08-10 DIAGNOSIS — N2581 Secondary hyperparathyroidism of renal origin: Secondary | ICD-10-CM | POA: Diagnosis not present

## 2020-08-10 DIAGNOSIS — N186 End stage renal disease: Secondary | ICD-10-CM | POA: Diagnosis not present

## 2020-08-10 DIAGNOSIS — S81802A Unspecified open wound, left lower leg, initial encounter: Secondary | ICD-10-CM | POA: Insufficient documentation

## 2020-08-10 DIAGNOSIS — E1129 Type 2 diabetes mellitus with other diabetic kidney complication: Secondary | ICD-10-CM | POA: Diagnosis not present

## 2020-08-10 DIAGNOSIS — R197 Diarrhea, unspecified: Secondary | ICD-10-CM | POA: Diagnosis not present

## 2020-08-10 DIAGNOSIS — I872 Venous insufficiency (chronic) (peripheral): Secondary | ICD-10-CM | POA: Diagnosis not present

## 2020-08-10 DIAGNOSIS — E11621 Type 2 diabetes mellitus with foot ulcer: Secondary | ICD-10-CM | POA: Diagnosis not present

## 2020-08-10 DIAGNOSIS — L97522 Non-pressure chronic ulcer of other part of left foot with fat layer exposed: Secondary | ICD-10-CM | POA: Diagnosis not present

## 2020-08-10 DIAGNOSIS — S91301A Unspecified open wound, right foot, initial encounter: Secondary | ICD-10-CM | POA: Diagnosis not present

## 2020-08-10 DIAGNOSIS — Z89412 Acquired absence of left great toe: Secondary | ICD-10-CM | POA: Diagnosis not present

## 2020-08-10 DIAGNOSIS — M7732 Calcaneal spur, left foot: Secondary | ICD-10-CM | POA: Diagnosis not present

## 2020-08-10 DIAGNOSIS — D509 Iron deficiency anemia, unspecified: Secondary | ICD-10-CM | POA: Diagnosis not present

## 2020-08-10 DIAGNOSIS — Z9889 Other specified postprocedural states: Secondary | ICD-10-CM | POA: Diagnosis not present

## 2020-08-10 DIAGNOSIS — E1140 Type 2 diabetes mellitus with diabetic neuropathy, unspecified: Secondary | ICD-10-CM | POA: Diagnosis not present

## 2020-08-10 DIAGNOSIS — E1122 Type 2 diabetes mellitus with diabetic chronic kidney disease: Secondary | ICD-10-CM | POA: Diagnosis not present

## 2020-08-10 DIAGNOSIS — Z89411 Acquired absence of right great toe: Secondary | ICD-10-CM | POA: Diagnosis not present

## 2020-08-10 DIAGNOSIS — M19071 Primary osteoarthritis, right ankle and foot: Secondary | ICD-10-CM | POA: Diagnosis not present

## 2020-08-10 DIAGNOSIS — D631 Anemia in chronic kidney disease: Secondary | ICD-10-CM | POA: Diagnosis not present

## 2020-08-10 DIAGNOSIS — Z992 Dependence on renal dialysis: Secondary | ICD-10-CM | POA: Diagnosis not present

## 2020-08-10 DIAGNOSIS — I120 Hypertensive chronic kidney disease with stage 5 chronic kidney disease or end stage renal disease: Secondary | ICD-10-CM | POA: Diagnosis not present

## 2020-08-10 NOTE — Progress Notes (Signed)
LYRICA, MCCLARTY ([id number]) Visit Report for 08/10/2020 Arrival Information Details Patient Name: Date of Service: [name] [name], [name] 08/10/2020 1:15 PM Medical Record Number: [id number] Patient Account Number: [id number] Date of Birth/Sex: Treating RN: [birth year] (70 y.o. [name], [name] Primary Care Mckinzy Fuller: [name] Other Clinician: Referring Allexis Bordenave: Treating Rasheed Welty/Extender: Rosezena Sensor in Treatment: 45 Visit Information History Since Last Visit Added or deleted any medications: No Patient Arrived: Wheel Chair Any new allergies or adverse reactions: No Arrival Time: 13:40 Had a fall or experienced change in No Accompanied By: self activities of daily living that may affect Transfer Assistance: None risk of falls: Patient Identification Verified: Yes Signs or symptoms of abuse/neglect since last visito No Secondary Verification Process Completed: Yes Hospitalized since last visit: No Patient Requires Transmission-Based Precautions: No Implantable device outside of the clinic excluding No Patient Has Alerts: Yes cellular tissue based products placed in the center Patient Alerts: Right ABI: [name] since last visit: Left ABI: [place] Pain Present Now: No Electronic Signature(s) Signed: 08/10/2020 2:57:49 PM By: Mikeal Hawthorne EMT/HBOT/SD Entered By: Mikeal Hawthorne on 08/10/2020 13:48:56 -------------------------------------------------------------------------------- Clinic Level of Care Assessment Details Patient Name: Date of Service: [facility] [name], [name] 08/10/2020 1:15 PM Medical Record Number: [id number] Patient Account Number: [id number] Date of Birth/Sex: Treating RN: Apr 20, 1951 (70 y.o. [name], Meta.Reding Primary Care Rankin Coolman: [name] Other Clinician: Referring Marquet Faircloth: Treating Aliz Meritt/Extender: Rosezena Sensor in Treatment: 26 Clinic Level of Care Assessment Items TOOL 4 Quantity Score X- 1 0 Use when only  an EandM is performed on FOLLOW-UP visit ASSESSMENTS - Nursing Assessment / Reassessment X- 1 10 Reassessment of Co-morbidities (includes updates in patient status) X- 1 5 Reassessment of Adherence to Treatment Plan ASSESSMENTS - Wound and Skin A ssessment / Reassessment []  - 0 Simple Wound Assessment / Reassessment - one wound X- 2 5 Complex Wound Assessment / Reassessment - multiple wounds X- 1 10 Dermatologic / Skin Assessment (not related to wound area) ASSESSMENTS - Focused Assessment X- 2 5 Circumferential Edema Measurements - multi extremities X- 1 10 Nutritional Assessment / Counseling / Intervention []  - 0 Lower Extremity Assessment (monofilament, tuning fork, pulses) []  - 0 Peripheral Arterial Disease Assessment (using hand held doppler) ASSESSMENTS - Ostomy and/or Continence Assessment and Care []  - 0 Incontinence Assessment and Management []  - 0 Ostomy Care Assessment and Management (repouching, etc.) PROCESS - Coordination of Care []  - 0 Simple Patient / Family Education for ongoing care X- 1 20 Complex (extensive) Patient / Family Education for ongoing care X- 1 10 Staff obtains Programmer, systems, Records, T Results / Process Orders est []  - 0 Staff telephones HHA, Nursing Homes / Clarify orders / etc []  - 0 Routine Transfer to another Facility (non-emergent condition) []  - 0 Routine Hospital Admission (non-emergent condition) []  - 0 New Admissions / Biomedical engineer / Ordering NPWT Apligraf, etc. , []  - 0 Emergency Hospital Admission (emergent condition) []  - 0 Simple Discharge Coordination X- 1 15 Complex (extensive) Discharge Coordination PROCESS - Special Needs []  - 0 Pediatric / Minor Patient Management []  - 0 Isolation Patient Management []  - 0 Hearing / Language / Visual special needs []  - 0 Assessment of Community assistance (transportation, D/C planning, etc.) []  - 0 Additional assistance / Altered mentation []  - 0 Support Surface(s)  Assessment (bed, cushion, seat, etc.) INTERVENTIONS - Wound Cleansing / Measurement []  - 0 Simple Wound Cleansing - one wound X- 2 5 Complex Wound Cleansing - multiple wounds  X- 1 5 Wound Imaging (photographs - any number of wounds) []  - 0 Wound Tracing (instead of photographs) []  - 0 Simple Wound Measurement - one wound X- 2 5 Complex Wound Measurement - multiple wounds INTERVENTIONS - Wound Dressings X - Small Wound Dressing one or multiple wounds 2 10 []  - 0 Medium Wound Dressing one or multiple wounds []  - 0 Large Wound Dressing one or multiple wounds []  - 0 Application of Medications - topical []  - 0 Application of Medications - injection INTERVENTIONS - Miscellaneous []  - 0 External ear exam []  - 0 Specimen Collection (cultures, biopsies, blood, body fluids, etc.) []  - 0 Specimen(s) / Culture(s) sent or taken to Lab for analysis []  - 0 Patient Transfer (multiple staff / Civil Service fast streamer / Similar devices) []  - 0 Simple Staple / Suture removal (25 or less) []  - 0 Complex Staple / Suture removal (26 or more) []  - 0 Hypo / Hyperglycemic Management (close monitor of Blood Glucose) []  - 0 Ankle / Brachial Index (ABI) - do not check if billed separately X- 1 5 Vital Signs Has the patient been seen at the hospital within the last three years: Yes Total Score: 150 Level Of Care: New/Established - Level 4 Electronic Signature(s) Signed: 08/10/2020 4:56:08 PM By: [name] Entered By: [name] on 08/10/2020 14:37:13 -------------------------------------------------------------------------------- Encounter Discharge Information Details Patient Name: Date of Service: [name], [place] [name] 08/10/2020 1:15 PM Medical Record Number: [id number] Patient Account Number: [id number] Date of Birth/Sex: Treating RN: 1951/05/05 (70 y.o. [name] Primary Care Jolita Haefner: [name] Other Clinician: Referring Marizol Borror: Treating Roosevelt Eimers/Extender: Rosezena Sensor in Treatment: 26 Encounter Discharge Information Items Discharge Condition: Stable Ambulatory Status: Ambulatory Discharge Destination: Home Transportation: Private Auto Accompanied By: self Schedule Follow-up Appointment: Yes Clinical Summary of Care: Patient Declined Electronic Signature(s) Signed: 08/10/2020 4:58:08 PM By: [name] RN, BSN Entered By: [name] on 08/10/2020 14:24:48 -------------------------------------------------------------------------------- Lower Extremity Assessment Details Patient Name: Date of Service: [facility] [name], [place] [name] 08/10/2020 1:15 PM Medical Record Number: [id number] Patient Account Number: [id number] Date of Birth/Sex: Treating RN: [birth year] (70 y.o. [name] Primary Care Basil Blakesley: [name] Other Clinician: Referring Suhaas Agena: Treating Kamirah Shugrue/Extender: Rosezena Sensor in Treatment: 26 Edema Assessment Assessed: [name]: No] Patrice [name]: No] Edema: [Left: No] [Right: No] Calf Left: Right: Point of Measurement: 38 cm From Medial Instep 34 cm 33 cm Ankle Left: Right: Point of Measurement: 13 cm From Medial Instep 23 cm 23 cm Vascular Assessment Pulses: Dorsalis Pedis Palpable: [Left:Yes] [Right:Yes] Electronic Signature(s) Signed: 08/10/2020 2:57:49 PM By: Mikeal Hawthorne EMT/HBOT/SD Signed: 08/10/2020 4:56:08 PM By: [name] Entered By: Mikeal Hawthorne on 08/10/2020 13:49:44 -------------------------------------------------------------------------------- Multi Wound Chart Details Patient Name: Date of Service: [name], [place] [name] 08/10/2020 1:15 PM Medical Record Number: [id number] Patient Account Number: [id number] Date of Birth/Sex: Treating RN: August 15, 1950 (70 y.o. [name], [name] Primary Care Holt Woolbright: [name] Other Clinician: Referring Robie Oats: Treating Destinae Neubecker/Extender: Rosezena Sensor in Treatment: 26 Vital  Signs Height(in): 34 Pulse(bpm): 114 Weight(lbs): 35 Blood Pressure(mmHg): 99/64 Body Mass Index(BMI): 32 Temperature(F): 99 Respiratory Rate(breaths/min): 17 Photos: [19:No 79 Right T Third oe] [8:No 82 Left T Third oe] [N/A:N/A N/A] Wound Location: [19:Footwear Injury] [8:Blister] [N/A:N/A] Wounding Event: [19:Diabetic Wound/Ulcer of the Lower] [8:Diabetic Wound/Ulcer of the Lower] [N/A:N/A] Primary Etiology: [19:Extremity Cataracts, Anemia, Asthma,] [8:Extremity Cataracts, Anemia, Asthma,] [N/A:N/A] Comorbid History: [19:Hypertension, Type II Diabetes, End Hypertension, Type II Diabetes, End Stage Renal Disease, Osteomyelitis,  Stage Renal Disease, Osteomyelitis, Neuropathy 06/25/2020] [8:Neuropathy 02/23/2020] [N/A:N/A] Date Acquired: [19:6] [8:24] [N/A:N/A] Weeks of Treatment: [19:Open] [8:Open] [N/A:N/A] Wound Status: [19:1.8x1.8x0.1] [8:0.4x0.6x0.1] [N/A:N/A] Measurements L x W x D (cm) [19:2.545] [8:0.188] [N/A:N/A] A (cm) : rea [19:0.254] [8:0.019] [N/A:N/A] Volume (cm) : [19:-825.50%] [8:-33.30%] [N/A:N/A] % Reduction in A rea: [19:-840.70%] [8:-35.70%] [N/A:N/A] % Reduction in Volume: [19:Grade 2] [8:Grade 2] [N/A:N/A] Classification: [19:Small] [8:Small] [N/A:N/A] Exudate A mount: [19:Serosanguineous] [8:Serosanguineous] [N/A:N/A] Exudate Type: [19:red, brown] [8:red, brown] [N/A:N/A] Exudate Color: [19:Distinct, outline attached] [8:Flat and Intact] [N/A:N/A] Wound Margin: [19:Large (67-100%)] [8:Large (67-100%)] [N/A:N/A] Granulation A mount: [19:Pink] [8:Red] [N/A:N/A] Granulation Quality: [19:Small (1-33%)] [8:Small (1-33%)] [N/A:N/A] Necrotic A mount: [19:Fat Layer (Subcutaneous Tissue): Yes Fat Layer (Subcutaneous Tissue): Yes N/A] Exposed Structures: [19:Bone: Yes Fascia: No Tendon: No Muscle: No Joint: No None] [8:Fascia: No Tendon: No Muscle: No Joint: No Bone: No Medium (34-66%)] [N/A:N/A] Treatment Notes Wound #19 (Toe Third) Wound Laterality:  Right Cleanser Soap and Water Discharge Instruction: May shower and wash wound with dial antibacterial soap and water prior to dressing change. Peri-Wound Care Topical Primary Dressing KerraCel Ag Gelling Fiber Dressing, 4x5 in (silver alginate) Discharge Instruction: Apply silver alginate to wound bed as instructed Secondary Dressing Woven Gauze Sponges 2x2 in Discharge Instruction: Apply over primary dressing as directed. Secured With Conforming Stretch Gauze Bandage, Sterile 2x75 (in/in) Discharge Instruction: Secure with stretch gauze as directed. Compression Wrap Compression Stockings Add-Ons Wound #8 (Toe Third) Wound Laterality: Left Cleanser Soap and Water Discharge Instruction: May shower and wash wound with dial antibacterial soap and water prior to dressing change. Peri-Wound Care Topical Primary Dressing KerraCel Ag Gelling Fiber Dressing, 4x5 in (silver alginate) Discharge Instruction: Apply silver alginate to wound bed as instructed Secondary Dressing Woven Gauze Sponges 2x2 in Discharge Instruction: Apply over primary dressing as directed. Secured With Conforming Stretch Gauze Bandage, Sterile 2x75 (in/in) Discharge Instruction: Secure with stretch gauze as directed. Compression Wrap Compression Stockings Add-Ons Electronic Signature(s) Signed: 08/10/2020 4:36:20 PM By: Linton Ham MD Signed: 08/10/2020 4:56:08 PM By: [name] Entered By: Linton Ham on 08/10/2020 14:31:11 -------------------------------------------------------------------------------- Multi-Disciplinary Care Plan Details Patient Name: Date of Service: [facility] [name], [place] [name] 08/10/2020 1:15 PM Medical Record Number: [id number] Patient Account Number: [id number] Date of Birth/Sex: Treating RN: [birth year] (70 y.o. [name] Primary Care Doloras Tellado: [name] Other Clinician: Referring Matika Bartell: Treating Curtis Cain/Extender: Rosezena Sensor in  Treatment: 26 Active Inactive Venous Leg Ulcer Nursing Diagnoses: Knowledge deficit related to disease process and management Potential for venous Insuffiency (use before diagnosis confirmed) Goals: Patient will maintain optimal edema control Date Initiated: 02/09/2020 Target Resolution Date: 09/22/2020 Goal Status: Active Patient/caregiver will verbalize understanding of disease process and disease management Date Initiated: 02/09/2020 Date Inactivated: 03/02/2020 Target Resolution Date: 03/08/2020 Goal Status: Met Interventions: Assess peripheral edema status every visit. Compression as ordered Provide education on venous insufficiency Treatment Activities: Therapeutic compression applied : 02/09/2020 Notes: Wound/Skin Impairment Nursing Diagnoses: Impaired tissue integrity Knowledge deficit related to ulceration/compromised skin integrity Goals: Patient/caregiver will verbalize understanding of skin care regimen Date Initiated: 02/09/2020 Target Resolution Date: 10/12/2020 Goal Status: Active Ulcer/skin breakdown will have a volume reduction of 30% by week 4 Date Initiated: 02/09/2020 Date Inactivated: 03/02/2020 Target Resolution Date: 03/08/2020 Goal Status: Met Interventions: Assess patient/caregiver ability to obtain necessary supplies Assess patient/caregiver ability to perform ulcer/skin care regimen upon admission and as needed Assess ulceration(s) every visit Provide education on ulcer and skin care Treatment Activities: Skin care regimen initiated : 02/09/2020 Topical wound management  initiated : 02/09/2020 Notes: Electronic Signature(s) Signed: 08/10/2020 4:56:08 PM By: [name] Entered By: [name] on 08/10/2020 13:59:01 -------------------------------------------------------------------------------- Pain Assessment Details Patient Name: Date of Service: [name] [name], [place] [name] 08/10/2020 1:15 PM Medical Record Number: [id number] Patient Account Number:  [id number] Date of Birth/Sex: Treating RN: Nov 20, 1950 (70 y.o. [name] Primary Care Susana Gripp: [name] Other Clinician: Referring Sheniya Garciaperez: Treating Nitya Cauthon/Extender: Rosezena Sensor in Treatment: 26 Active Problems Location of Pain Severity and Description of Pain Patient Has Paino No Site Locations With Dressing Change: No Pain Management and Medication Current Pain Management: Electronic Signature(s) Signed: 08/10/2020 2:57:49 PM By: Mikeal Hawthorne EMT/HBOT/SD Signed: 08/10/2020 4:56:08 PM By: [name] Entered By: Mikeal Hawthorne on 08/10/2020 13:49:23 -------------------------------------------------------------------------------- Patient/Caregiver Education Details Patient Name: Date of Service: [facility] [name], [place] [name] 1/20/2022andnbsp1:15 PM Medical Record Number: [id number] Patient Account Number: [id number] Date of Birth/Gender: Treating RN: 1950/09/22 (69 y.o. [name] Primary Care Physician: [name] Other Clinician: Referring Physician: Treating Physician/Extender: Rosezena Sensor in Treatment: 26 Education Assessment Education Provided To: Patient Education Topics Provided Venous: Handouts: Managing Venous Disease and Related Ulcers Methods: Explain/Verbal Responses: Reinforcements needed Electronic Signature(s) Signed: 08/10/2020 4:56:08 PM By: [name] Entered By: [name] on 08/10/2020 13:59:12 -------------------------------------------------------------------------------- Wound Assessment Details Patient Name: Date of Service: [name], [place] [name] 08/10/2020 1:15 PM Medical Record Number: [id number] Patient Account Number: [id number] Date of Birth/Sex: Treating RN: [birth year] (70 y.o. [name] Primary Care Marcianna Daily: [name] Other Clinician: Referring Atari Novick: Treating Makyle Eslick/Extender: Rosezena Sensor in Treatment: 26 Wound  Status Wound Number: 19 Primary Diabetic Wound/Ulcer of the Lower Extremity Etiology: Wound Location: Right T Third oe Wound Open Wounding Event: Footwear Injury Status: Date Acquired: 06/25/2020 Comorbid Cataracts, Anemia, Asthma, Hypertension, Type II Diabetes, End Weeks Of Treatment: 6 History: Stage Renal Disease, Osteomyelitis, Neuropathy Clustered Wound: No Wound Measurements Length: (cm) 1.8 Width: (cm) 1.8 Depth: (cm) 0.1 Area: (cm) 2.545 Volume: (cm) 0.254 % Reduction in Area: -825.5% % Reduction in Volume: -840.7% Epithelialization: None Tunneling: No Undermining: No Wound Description Classification: Grade 2 Wound Margin: Distinct, outline attached Exudate Amount: Small Exudate Type: Serosanguineous Exudate Color: red, brown Foul Odor After Cleansing: No Slough/Fibrino No Wound Bed Granulation Amount: Large (67-100%) Exposed Structure Granulation Quality: Pink Fascia Exposed: No Necrotic Amount: Small (1-33%) Fat Layer (Subcutaneous Tissue) Exposed: Yes Necrotic Quality: Adherent Slough Tendon Exposed: No Muscle Exposed: No Joint Exposed: No Bone Exposed: Yes Treatment Notes Wound #19 (Toe Third) Wound Laterality: Right Cleanser Soap and Water Discharge Instruction: May shower and wash wound with dial antibacterial soap and water prior to dressing change. Peri-Wound Care Topical Primary Dressing KerraCel Ag Gelling Fiber Dressing, 4x5 in (silver alginate) Discharge Instruction: Apply silver alginate to wound bed as instructed Secondary Dressing Woven Gauze Sponges 2x2 in Discharge Instruction: Apply over primary dressing as directed. Secured With Conforming Stretch Gauze Bandage, Sterile 2x75 (in/in) Discharge Instruction: Secure with stretch gauze as directed. Compression Wrap Compression Stockings Add-Ons Electronic Signature(s) Signed: 08/10/2020 2:57:49 PM By: Mikeal Hawthorne EMT/HBOT/SD Signed: 08/10/2020 4:56:08 PM By: [name] Entered By: Mikeal Hawthorne on 08/10/2020 13:59:38 -------------------------------------------------------------------------------- Wound Assessment Details Patient Name: Date of Service: [name], [place] [name] 08/10/2020 1:15 PM Medical Record Number: [id number] Patient Account Number: [id number] Date of Birth/Sex: Treating RN: 01/05/1951 (70 y.o. [name] Primary Care Azaylea Maves: [name] Other Clinician: Referring Deny Chevez: Treating Aleene Swanner/Extender: Rosezena Sensor in Treatment: 26 Wound Status Wound  Number: 8 Primary Diabetic Wound/Ulcer of the Lower Extremity Etiology: Wound Location: Left T Third oe Wound Open Wounding Event: Blister Status: Date Acquired: 02/23/2020 Comorbid Cataracts, Anemia, Asthma, Hypertension, Type II Diabetes, End Weeks Of Treatment: 24 History: Stage Renal Disease, Osteomyelitis, Neuropathy Clustered Wound: No Wound Measurements Length: (cm) 0.4 Width: (cm) 0.6 Depth: (cm) 0.1 Area: (cm) 0.188 Volume: (cm) 0.019 % Reduction in Area: -33.3% % Reduction in Volume: -35.7% Epithelialization: Medium (34-66%) Tunneling: No Undermining: No Wound Description Classification: Grade 2 Wound Margin: Flat and Intact Exudate Amount: Small Exudate Type: Serosanguineous Exudate Color: red, brown Foul Odor After Cleansing: No Slough/Fibrino Yes Wound Bed Granulation Amount: Large (67-100%) Exposed Structure Granulation Quality: Red Fascia Exposed: No Necrotic Amount: Small (1-33%) Fat Layer (Subcutaneous Tissue) Exposed: Yes Necrotic Quality: Adherent Slough Tendon Exposed: No Muscle Exposed: No Joint Exposed: No Bone Exposed: No Treatment Notes Wound #8 (Toe Third) Wound Laterality: Left Cleanser Soap and Water Discharge Instruction: May shower and wash wound with dial antibacterial soap and water prior to dressing change. Peri-Wound Care Topical Primary Dressing KerraCel Ag Gelling Fiber Dressing,  4x5 in (silver alginate) Discharge Instruction: Apply silver alginate to wound bed as instructed Secondary Dressing Woven Gauze Sponges 2x2 in Discharge Instruction: Apply over primary dressing as directed. Secured With Conforming Stretch Gauze Bandage, Sterile 2x75 (in/in) Discharge Instruction: Secure with stretch gauze as directed. Compression Wrap Compression Stockings Add-Ons Electronic Signature(s) Signed: 08/10/2020 2:57:49 PM By: Mikeal Hawthorne EMT/HBOT/SD Signed: 08/10/2020 4:56:08 PM By: [name] Entered By: Mikeal Hawthorne on 08/10/2020 14:00:14 -------------------------------------------------------------------------------- Vitals Details Patient Name: Date of Service: [name], [place] [name] 08/10/2020 1:15 PM Medical Record Number: [id number] Patient Account Number: [id number] Date of Birth/Sex: Treating RN: 12-09-1950 (69 y.o. [name], Meta.Reding Primary Care Viveca Beckstrom: [name] Other Clinician: Referring Nollan Muldrow: Treating Lance Huaracha/Extender: Rosezena Sensor in Treatment: 26 Vital Signs Time Taken: 13:45 Temperature (F): 99 Height (in): 67 Pulse (bpm): 114 Weight (lbs): 202 Respiratory Rate (breaths/min): 20 Body Mass Index (BMI): 31.6 Blood Pressure (mmHg): 99/64 Reference Range: 80 - 120 mg / dl Electronic Signature(s) Signed: 08/10/2020 2:57:49 PM By: Mikeal Hawthorne EMT/HBOT/SD Entered By: Mikeal Hawthorne on 08/10/2020 13:49:14

## 2020-08-10 NOTE — Progress Notes (Signed)
TESSIE, ORDAZ ([id number]) Visit Report for 08/10/2020 HPI Details Patient Name: Date of Service: [name], [name] 08/10/2020 1:15 PM Medical Record Number: [id number] Patient Account Number: [id number] Date of Birth/Sex: Treating RN: [birth year] (70 y.o. [name] Primary Care Provider: [name] Other Clinician: Referring Provider: Treating Provider/Extender: Rosezena Sensor in Treatment: 26 History of Present Illness HPI Description: Evaluate7/21/2021 today patient presents for evaluation of ulcerations on her legs which she tells me tend to come and go as far as small blistering locations and sometimes are better than other times. Right now she tells me that this is actually doing a little bit better but nonetheless will not completely go away. She has ulcerations bilaterally on her lower extremities. The right is worse than the left. 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 The patient has no evidence of active infection at this time. She however appears to have proficient blood flow into the extremities and I think would tolerate a 3 layer compression wrap she was noncompressible on arterial studies. Nonetheless there was no obvious signs of occlusion. 02/16/2020 on evaluation today patient appears to be doing much better in regard to her wounds in general. On the past week she has made significant improvements which is great news there is no signs of active infection at this time. No fevers, chills, nausea, vomiting, or diarrhea. 02/23/2020 on evaluation today patient appears to be doing well in regard to her lower extremity ulcers and ankle ulcers. Overall I feel like she is making great progress and in general I am extremely pleased with how things stand. There is no sign of active infection at this time which is also good news. She will  require slight debridement around the right ankle region. 03/02/20-Patient returns at 1 week, has few new areas open on the left leg, especially 1 healed area on the medial ankle that has slight small open area now, to anterior leg areas that started out as blisters that are open now. She was using juxta lite compression to the left, she used Band-Aid to cover the vesicle that had ruptured on the left anterior leg, we are using 3 layer compression on the right. We are using silver alginate to the toe wound on the left. 03/08/2020 upon evaluation today patient unfortunately is doing much worse in regard to her right leg although her left leg looks like is doing better. Again we have taken her compression wraps off last week since she was doing well and transitioned her to her Velcro compression wrap. However apparently the patient is stating this was not put on here in the clinic and she never put it on at home. With that being said unfortunately she developed swelling and opened up new wounds which are present today she has quite a few of them in fact. 03/15/2020 on evaluation today patient appears to be doing well with regard to her wounds on the lower extremities bilaterally. Fortunately there is no signs of active infection at this time. No fever chills noted. Overall I am actually very pleased with where things stand currently. 03/22/2020 on evaluation today patient appears to be doing fairly well in regard to her wounds in general. She just has really one area left on her toe everything else is healed on her legs. She does have her Velcro compression wraps which I think are appropriate at this point. Fortunately  there is no signs of active infection at this time. No fevers, chills, nausea, vomiting, or diarrhea. 03/29/2020 on evaluation today patient appears to be doing poorly in regard to her right leg which is pretty much completely reopened compared to last week. She states it was okay until Saturday  when she woke up she had blisters. With that being said it appears that this is likely stemming from the fact that the patient is apparently though I just found this out today going to sleep in her bed with her legs up on the bed with her but throughout the night she tends to end up with her legs hanging off the side or bottom of the bed on the floor. With that being said I think that this likely is happening much too long and then she has significant swelling the builds up as she sleeps. Obviously I think this is the reasoning behind what we are seeing currently. 04/05/2020 on evaluation today patient appears to be doing quite well with regard to her leg ulcers at this point. Fortunately there is no signs of anything significantly open again which is great news nonetheless I am to continue wrapping her for at least a couple of weeks due to the fact that she seems to continually reopen as soon as we unwrapped her. 04/12/2020 upon evaluation today patient appears to be doing excellent in regard to her leg ulcers all appear to be improving which is great news. She is also doing better in regard to the toe the infection seems to be under better control and the Santyl is helping to clean this up. Overall very pleased much more so than what I saw last week. 04/19/2020 on evaluation today patient appears to be doing well with regard to her leg ulcers which are showing no signs of opening at this point. Her toe is also showing signs of improvement she is about done with her antibiotic. Overall I feel like things are doing quite well. 04/26/2020 upon evaluation today patient appears to be doing unfortunately a little bit worse compared to previous. She has reopened wounds on the right anterior lower extremity. This is unfortunate as we were hoping that this would really maintain healing. That does not appear to be the case. I think with him the need to see about making a referral for venous studies and provider  evaluation with [facility] imaging with the interventional radiologist. 08/11/2019 upon evaluation today patient appears to be doing well in general in regard to her legs. Her right leg she does have a new wound that opened since last week. On the left leg everything appears to be healed she does still have the opening on her toe which we are managing but again this does not appear to be any worse. She just needs to get this to feeling and cover over. There is no evidence of infection which is at least great news. 05/17/2020 upon evaluation today patient appears to be doing decently well in regard to her leg ulcers this is good news. She does have still an opening on the left second toe. She has been let this dry out that not really what we want to see currently. For that reason I discussed with her again the fact that she needs to keep this covered and not allow it to dry out in order to allow for appropriate healing. She voiced understanding and states she did not know that. 05/24/2020 upon evaluation today patient's legs both appear to be still  be completely healed which is great news. We are now going to transition into having her utilize her juxta lite compression wraps on the bilateral lower extremities. She is definitely ready for this. Her toe also seems to be doing better. 05/31/2020 on evaluation today patient appears to be doing well with regard to her legs. There is nothing open at this point. Her toe still has some slough noted and get a clean that off today by way of sharp debridement other than that I feel like the collagen is probably still her best bet in that regard. 06/07/2020 upon evaluation today patient appears to be doing well with regard to her left leg and her toe ulcer the toe seems to be doing better the leg is doing okay. The right leg unfortunately she has new wounds I think her shoes are rubbing on the side of her ankle which is what is causing part of this issue. Fortunately  there is no signs of active infection at this time which is great news. 06/14/2020 upon evaluation today patient appears to be doing well today in regard to most of her wounds. Everything seems to be showing some signs of improvement or else healing. Fortunately there is no signs of active infection at this time. No fevers, chills, nausea, vomiting, or diarrhea. She has been using a silver collagen for her toe ulcer. Damage12/07/2019 on evaluation today patient appears to be doing okay in regard to her right medial ankle ulcer in her left toe ulcer. Both are a little bit better today visually but again she still is not doing as well as I like to see. She still continues to wear the worn-out 70 year old crocs which are not good for her to be honest I think it is causing and preventing healing. Fortunately there is no signs of active infection at this time. No fevers, chills, nausea, vomiting, or diarrhea. 06/28/20 upon evaluation today patient appears to be doing poorly in regard to her second toes bilaterally. These both appear to be rubbing on her shoes these are shoes that have asked him to get rid of every visit since have been seeing her. I had a much more lengthy conversation with her about it last week she told me she was going to get different shoes to wear I get she comes in with the same shoes on today. Nonetheless I think this is part of her problem. Obviously the left second toe appears to be infected at this point. The right necessarily see evidence of this but nonetheless is definitely been rubbing where it sticks up. 07/05/2020 on evaluation today patient's toe ulcers appear to be doing really about the same the left toe may be a little better in the right toe a little worse but overall not much change. Fortunately there is no signs of active infection at this time. No fevers, chills, nausea, vomiting, or diarrhea. 07/27/2020. Patient I do not usually see. She has wounds on her dorsal  bilateral third toes. 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 She comes in today using Santyl on the wounds. The left third toe is red and swollen right third toe with thick eschar over the wound surface. 1/13; patient has wounds on her bilateral third toes PIP. The area on the right has exposed bone. The area on the left this week does not. I put her on doxycycline empirically last  week because of the appearance of the right third toe. The toe seems less swollen and red today but she did not get x-rays 1/20; wounds over the PIP is of both bilateral third toes dorsally. The area on the right has exposed bone in the area on the left has a necrotic surface. I gave her doxycycline a couple of weeks ago because of the swelling and redness of the third toe this is appeared better. She has finally gotten x-rays done but I have not had a chance to look at them yet. Although the patient has difficult pulses to feel she did have arterial studies done in May 2021. On the right she had noncompressible ABIs but triphasic waveforms. On the left again noncompressible ABIs with triphasic waveforms. She does not have a great toe bilaterally however on the left and the right her waveforms in her remaining toes were felt to be normal. Once again she comes into this clinic in crocs with socks on her feet. We have been using silver alginate on the wounds awaiting the x-rays. Electronic Signature(s) Signed: 08/10/2020 4:36:20 PM By: Linton Ham MD Entered By: Linton Ham on 08/10/2020 14:34:03 -------------------------------------------------------------------------------- Physical Exam Details Patient Name: Date of Service: [name], [place] [name] 08/10/2020 1:15 PM Medical Record Number: [id number] Patient Account Number: [id number] Date of Birth/Sex: Treating RN: 09/19/1950 (70 y.o. [name] Primary  Care Provider: [name] Other Clinician: Referring Provider: Treating Provider/Extender: Rosezena Sensor in Treatment: 26 Constitutional Patient is hypotensive. But appears well. Pulse regular and within target range for patient.Marland Kitchen Respirations regular, non-labored and within target range.. Temperature is normal and within the target range for the patient.Marland Kitchen Appears in no distress. Cardiovascular Pedal pulses absent bilaterally.. Integumentary (Hair, Skin) Charcot deformities pes planus deformities in both feet. Notes Wound exam; dorsal third toes bilaterally over the PIPs. Neither 1 of these wounds looks particularly good. Exposed bone on the right necrotic eschar on the left that is come back since last week when I debrided this off. I am awaiting the x-rays. She has already had amputations of the first and second toes bilaterally. Presumably for osteomyelitis. Electronic Signature(s) Signed: 08/10/2020 4:36:20 PM By: Linton Ham MD Entered By: Linton Ham on 08/10/2020 14:36:10 -------------------------------------------------------------------------------- Physician Orders Details Patient Name: Date of Service: [facility] [name], [place] [name] 08/10/2020 1:15 PM Medical Record Number: [id number] Patient Account Number: [id number] Date of Birth/Sex: Treating RN: Feb 14, 1951 (70 y.o. [name], Meta.Reding Primary Care Provider: [name] Other Clinician: Referring Provider: Treating Provider/Extender: Rosezena Sensor in Treatment: 26 Verbal / Phone Orders: No Diagnosis Coding ICD-10 Coding Code Description I87.2 Venous insufficiency (chronic) (peripheral) E11.621 Type 2 diabetes mellitus with foot ulcer L97.522 Non-pressure chronic ulcer of other part of left foot with fat layer exposed L97.512 Non-pressure chronic ulcer of other part of right foot with fat layer exposed N18.6 End stage renal disease Z99.2 Dependence on renal  dialysis I10 Essential (primary) hypertension Follow-up Appointments Return Appointment in 1 week. Bathing/ Shower/ Hygiene May shower and wash wound with soap and water. Edema Control - Lymphedema / SCD / Other Bilateral Lower Extremities Elevate legs to the level of the heart or above for 30 minutes daily and/or when sitting, a frequency of: Avoid standing for long periods of time. Exercise regularly Moisturize legs daily. Compression stocking or Garment 20-30 mm/Hg pressure to: - juxtalites to both legs daily, apply in the morning and remove at night Additional Orders / Instructions Other: - ****Patient to  please wear other shoes. Do not wear Crocs**** Wound Treatment Wound #19 - T Third oe Wound Laterality: Right Cleanser: Soap and Water Every Other Day/30 Days Discharge Instructions: May shower and wash wound with dial antibacterial soap and water prior to dressing change. Prim Dressing: KerraCel Ag Gelling Fiber Dressing, 4x5 in (silver alginate) Every Other Day/30 Days ary Discharge Instructions: Apply silver alginate to wound bed as instructed Secondary Dressing: Woven Gauze Sponges 2x2 in Every Other Day/30 Days Discharge Instructions: Apply over primary dressing as directed. Secured With: Child psychotherapist, Sterile 2x75 (in/in) Every Other Day/30 Days Discharge Instructions: Secure with stretch gauze as directed. Wound #8 - T Third oe Wound Laterality: Left Cleanser: Soap and Water Every Other Day/30 Days Discharge Instructions: May shower and wash wound with dial antibacterial soap and water prior to dressing change. Prim Dressing: KerraCel Ag Gelling Fiber Dressing, 4x5 in (silver alginate) Every Other Day/30 Days ary Discharge Instructions: Apply silver alginate to wound bed as instructed Secondary Dressing: Woven Gauze Sponges 2x2 in Every Other Day/30 Days Discharge Instructions: Apply over primary dressing as directed. Secured With: Hotel manager, Sterile 2x75 (in/in) Every Other Day/30 Days Discharge Instructions: Secure with stretch gauze as directed. Electronic Signature(s) Signed: 08/10/2020 4:36:20 PM By: Linton Ham MD Signed: 08/10/2020 4:56:08 PM By: [name] Entered By: [name] on 08/10/2020 14:12:26 -------------------------------------------------------------------------------- Problem List Details Patient Name: Date of Service: [name], [place] [name] 08/10/2020 1:15 PM Medical Record Number: [id number] Patient Account Number: [id number] Date of Birth/Sex: Treating RN: 02-Sep-1950 (70 y.o. [name], Meta.Reding Primary Care Provider: [name] Other Clinician: Referring Provider: Treating Provider/Extender: Rosezena Sensor in Treatment: 26 Active Problems ICD-10 Encounter Code Description Active Date MDM Diagnosis I87.2 Venous insufficiency (chronic) (peripheral) 02/09/2020 No Yes E11.621 Type 2 diabetes mellitus with foot ulcer 02/09/2020 No Yes L97.522 Non-pressure chronic ulcer of other part of left foot with fat layer exposed 05/31/2020 No Yes L97.512 Non-pressure chronic ulcer of other part of right foot with fat layer exposed 07/05/2020 No Yes N18.6 End stage renal disease 02/09/2020 No Yes Z99.2 Dependence on renal dialysis 02/09/2020 No Yes I10 Essential (primary) hypertension 02/09/2020 No Yes Inactive Problems Resolved Problems ICD-10 Code Description Active Date Resolved Date L97.822 Non-pressure chronic ulcer of other part of left lower leg with fat layer exposed 02/09/2020 02/09/2020 L97.312 Non-pressure chronic ulcer of right ankle with fat layer exposed 02/09/2020 02/09/2020 L97.812 Non-pressure chronic ulcer of other part of right lower leg with fat layer exposed 02/09/2020 02/09/2020 L97.512 Non-pressure chronic ulcer of other part of right foot with fat layer exposed 02/09/2020 02/09/2020 Electronic Signature(s) Signed: 08/10/2020 4:36:20 PM By: Linton Ham MD Entered By: Linton Ham on 08/10/2020 14:31:01 -------------------------------------------------------------------------------- Progress Note Details Patient Name: Date of Service: [name], [place] [name] 08/10/2020 1:15 PM Medical Record Number: [id number] Patient Account Number: [id number] Date of Birth/Sex: Treating RN: Nov 13, 1950 (71 y.o. [name] Primary Care Provider: [name] Other Clinician: Referring Provider: Treating Provider/Extender: Rosezena Sensor in Treatment: 26 Subjective History of Present Illness (HPI) Evaluate7/21/2021 today patient presents for evaluation of ulcerations on her legs which she tells me tend to come and go as far as small blistering locations and sometimes are better than other times. Right now she tells me that this is actually doing a little bit better but nonetheless will not completely go away. She has ulcerations bilaterally on her lower extremities. The right is worse than the left. 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 The patient has no evidence of active infection at this time. She however appears to have proficient blood flow into the extremities and I think would tolerate a 3 layer compression wrap she was noncompressible on arterial studies. Nonetheless there was no obvious signs of occlusion. 02/16/2020 on evaluation today patient appears to be doing much better in regard to her wounds in general. On the past week she has made significant improvements which is great news there is no signs of active infection at this time. No fevers, chills, nausea, vomiting, or diarrhea. 02/23/2020 on evaluation today patient appears to be doing well in regard to her lower extremity ulcers and ankle ulcers. Overall I feel like she is making great progress and in general I am extremely pleased with how  things stand. There is no sign of active infection at this time which is also good news. She will require slight debridement around the right ankle region. 03/02/20-Patient returns at 1 week, has few new areas open on the left leg, especially 1 healed area on the medial ankle that has slight small open area now, to anterior leg areas that started out as blisters that are open now. She was using juxta lite compression to the left, she used Band-Aid to cover the vesicle that had ruptured on the left anterior leg, we are using 3 layer compression on the right. We are using silver alginate to the toe wound on the left. 03/08/2020 upon evaluation today patient unfortunately is doing much worse in regard to her right leg although her left leg looks like is doing better. Again we have taken her compression wraps off last week since she was doing well and transitioned her to her Velcro compression wrap. However apparently the patient is stating this was not put on here in the clinic and she never put it on at home. With that being said unfortunately she developed swelling and opened up new wounds which are present today she has quite a few of them in fact. 03/15/2020 on evaluation today patient appears to be doing well with regard to her wounds on the lower extremities bilaterally. Fortunately there is no signs of active infection at this time. No fever chills noted. Overall I am actually very pleased with where things stand currently. 03/22/2020 on evaluation today patient appears to be doing fairly well in regard to her wounds in general. She just has really one area left on her toe everything else is healed on her legs. She does have her Velcro compression wraps which I think are appropriate at this point. Fortunately there is no signs of active infection at this time. No fevers, chills, nausea, vomiting, or diarrhea. 03/29/2020 on evaluation today patient appears to be doing poorly in regard to her right leg  which is pretty much completely reopened compared to last week. She states it was okay until Saturday when she woke up she had blisters. With that being said it appears that this is likely stemming from the fact that the patient is apparently though I just found this out today going to sleep in her bed with her legs up on the bed with her but throughout the night she tends to end up with her legs hanging off the side or bottom of the bed on the floor. With that being said I think that this likely is happening much too long and  then she has significant swelling the builds up as she sleeps. Obviously I think this is the reasoning behind what we are seeing currently. 04/05/2020 on evaluation today patient appears to be doing quite well with regard to her leg ulcers at this point. Fortunately there is no signs of anything significantly open again which is great news nonetheless I am to continue wrapping her for at least a couple of weeks due to the fact that she seems to continually reopen as soon as we unwrapped her. 04/12/2020 upon evaluation today patient appears to be doing excellent in regard to her leg ulcers all appear to be improving which is great news. She is also doing better in regard to the toe the infection seems to be under better control and the Santyl is helping to clean this up. Overall very pleased much more so than what I saw last week. 04/19/2020 on evaluation today patient appears to be doing well with regard to her leg ulcers which are showing no signs of opening at this point. Her toe is also showing signs of improvement she is about done with her antibiotic. Overall I feel like things are doing quite well. 04/26/2020 upon evaluation today patient appears to be doing unfortunately a little bit worse compared to previous. She has reopened wounds on the right anterior lower extremity. This is unfortunate as we were hoping that this would really maintain healing. That does not appear to be  the case. I think with him the need to see about making a referral for venous studies and provider evaluation with [facility] imaging with the interventional radiologist. 08/11/2019 upon evaluation today patient appears to be doing well in general in regard to her legs. Her right leg she does have a new wound that opened since last week. On the left leg everything appears to be healed she does still have the opening on her toe which we are managing but again this does not appear to be any worse. She just needs to get this to feeling and cover over. There is no evidence of infection which is at least great news. 05/17/2020 upon evaluation today patient appears to be doing decently well in regard to her leg ulcers this is good news. She does have still an opening on the left second toe. She has been let this dry out that not really what we want to see currently. For that reason I discussed with her again the fact that she needs to keep this covered and not allow it to dry out in order to allow for appropriate healing. She voiced understanding and states she did not know that. 05/24/2020 upon evaluation today patient's legs both appear to be still be completely healed which is great news. We are now going to transition into having her utilize her juxta lite compression wraps on the bilateral lower extremities. She is definitely ready for this. Her toe also seems to be doing better. 05/31/2020 on evaluation today patient appears to be doing well with regard to her legs. There is nothing open at this point. Her toe still has some slough noted and get a clean that off today by way of sharp debridement other than that I feel like the collagen is probably still her best bet in that regard. 06/07/2020 upon evaluation today patient appears to be doing well with regard to her left leg and her toe ulcer the toe seems to be doing better the leg is doing okay. The right leg unfortunately she has new  wounds I think her  shoes are rubbing on the side of her ankle which is what is causing part of this issue. Fortunately there is no signs of active infection at this time which is great news. 06/14/2020 upon evaluation today patient appears to be doing well today in regard to most of her wounds. Everything seems to be showing some signs of improvement or else healing. Fortunately there is no signs of active infection at this time. No fevers, chills, nausea, vomiting, or diarrhea. She has been using a silver collagen for her toe ulcer. Damage12/07/2019 on evaluation today patient appears to be doing okay in regard to her right medial ankle ulcer in her left toe ulcer. Both are a little bit better today visually but again she still is not doing as well as I like to see. She still continues to wear the worn-out 70 year old crocs which are not good for her to be honest I think it is causing and preventing healing. Fortunately there is no signs of active infection at this time. No fevers, chills, nausea, vomiting, or diarrhea. 06/28/20 upon evaluation today patient appears to be doing poorly in regard to her second toes bilaterally. These both appear to be rubbing on her shoes these are shoes that have asked him to get rid of every visit since have been seeing her. I had a much more lengthy conversation with her about it last week she told me she was going to get different shoes to wear I get she comes in with the same shoes on today. Nonetheless I think this is part of her problem. Obviously the left second toe appears to be infected at this point. The right necessarily see evidence of this but nonetheless is definitely been rubbing where it sticks up. 07/05/2020 on evaluation today patient's toe ulcers appear to be doing really about the same the left toe may be a little better in the right toe a little worse but overall not much change. Fortunately there is no signs of active infection at this time. No fevers, chills,  nausea, vomiting, or diarrhea. 07/27/2020. Patient I do not usually see. She has wounds on her dorsal bilateral third toes. 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 She comes in today using Santyl on the wounds. The left third toe is red and swollen right third toe with thick eschar over the wound surface. 1/13; patient has wounds on her bilateral third toes PIP. The area on the right has exposed bone. The area on the left this week does not. I put her on doxycycline empirically last week because of the appearance of the right third toe. The toe seems less swollen and red today but she did not get x-rays 1/20; wounds over the PIP is of both bilateral third toes dorsally. The area on the right has exposed bone in the area on the left has a necrotic surface. I gave her doxycycline a couple of weeks ago because of the swelling and redness of the third toe this is appeared better. She has finally gotten x-rays done but I have not had a chance to look at them yet. Although the patient has difficult pulses to feel she did have arterial studies done in May 2021. On the right she had noncompressible ABIs but triphasic waveforms. On the left again noncompressible ABIs with triphasic waveforms. She does not have a great toe  bilaterally however on the left and the right her waveforms in her remaining toes were felt to be normal. Once again she comes into this clinic in crocs with socks on her feet. We have been using silver alginate on the wounds awaiting the x-rays. Objective Constitutional Patient is hypotensive. But appears well. Pulse regular and within target range for patient.Marland Kitchen Respirations regular, non-labored and within target range.. Temperature is normal and within the target range for the patient.Marland Kitchen Appears in no distress. Vitals Time Taken: 1:45 PM, Height: 67 in, Weight: 202 lbs, BMI: 31.6,  Temperature: 99 F, Pulse: 114 bpm, Respiratory Rate: 20 breaths/min, Blood Pressure: 99/64 mmHg. Cardiovascular Pedal pulses absent bilaterally.. General Notes: Wound exam; dorsal third toes bilaterally over the PIPs. Neither 1 of these wounds looks particularly good. Exposed bone on the right necrotic eschar on the left that is come back since last week when I debrided this off. I am awaiting the x-rays. She has already had amputations of the first and second toes bilaterally. Presumably for osteomyelitis. Integumentary (Hair, Skin) Charcot deformities pes planus deformities in both feet. Wound #19 status is Open. Original cause of wound was Footwear Injury. The wound is located on the Right T Third. The wound measures 1.8cm length x oe 1.8cm width x 0.1cm depth; 2.545cm^2 area and 0.254cm^3 volume. There is bone and Fat Layer (Subcutaneous Tissue) exposed. There is no tunneling or undermining noted. There is a small amount of serosanguineous drainage noted. The wound margin is distinct with the outline attached to the wound base. There is large (67-100%) pink granulation within the wound bed. There is a small (1-33%) amount of necrotic tissue within the wound bed including Adherent Slough. Wound #8 status is Open. Original cause of wound was Blister. The wound is located on the Left T Third. The wound measures 0.4cm length x 0.6cm width x oe 0.1cm depth; 0.188cm^2 area and 0.019cm^3 volume. There is Fat Layer (Subcutaneous Tissue) exposed. There is no tunneling or undermining noted. There is a small amount of serosanguineous drainage noted. The wound margin is flat and intact. There is large (67-100%) red granulation within the wound bed. There is a small (1-33%) amount of necrotic tissue within the wound bed including Adherent Slough. Assessment Active Problems ICD-10 Venous insufficiency (chronic) (peripheral) Type 2 diabetes mellitus with foot ulcer Non-pressure chronic ulcer of other  part of left foot with fat layer exposed Non-pressure chronic ulcer of other part of right foot with fat layer exposed End stage renal disease Dependence on renal dialysis Essential (primary) hypertension Plan Follow-up Appointments: Return Appointment in 1 week. Bathing/ Shower/ Hygiene: May shower and wash wound with soap and water. Edema Control - Lymphedema / SCD / Other: Elevate legs to the level of the heart or above for 30 minutes daily and/or when sitting, a frequency of: Avoid standing for long periods of time. Exercise regularly Moisturize legs daily. Compression stocking or Garment 20-30 mm/Hg pressure to: - juxtalites to both legs daily, apply in the morning and remove at night Additional Orders / Instructions: Other: - ****Patient to please wear other shoes. Do not wear Crocs**** WOUND #19: - T Third Wound Laterality: Right oe Cleanser: Soap and Water Every Other Day/30 Days Discharge Instructions: May shower and wash wound with dial antibacterial soap and water prior to dressing change. Prim Dressing: KerraCel Ag Gelling Fiber Dressing, 4x5 in (silver alginate) Every Other Day/30 Days ary Discharge Instructions: Apply silver alginate to wound bed as instructed Secondary Dressing: Woven Gauze Sponges 2x2  in Every Other Day/30 Days Discharge Instructions: Apply over primary dressing as directed. Secured With: Child psychotherapist, Sterile 2x75 (in/in) Every Other Day/30 Days Discharge Instructions: Secure with stretch gauze as directed. WOUND #8: - T Third Wound Laterality: Left oe Cleanser: Soap and Water Every Other Day/30 Days Discharge Instructions: May shower and wash wound with dial antibacterial soap and water prior to dressing change. Prim Dressing: KerraCel Ag Gelling Fiber Dressing, 4x5 in (silver alginate) Every Other Day/30 Days ary Discharge Instructions: Apply silver alginate to wound bed as instructed Secondary Dressing: Woven Gauze Sponges  2x2 in Every Other Day/30 Days Discharge Instructions: Apply over primary dressing as directed. Secured With: Child psychotherapist, Sterile 2x75 (in/in) Every Other Day/30 Days Discharge Instructions: Secure with stretch gauze as directed. 1. I will look at the x-rays that she had of her feet. 2. Clinically there was a suggestion of ongoing ischemia here although the patient already had arterial studies and it was difficult to really support a significant blood flow problem at least in terms of macrovascular disease. 1 would wonder about her microvascular status. I will need to check if she actually did see a vascular interventional physician. 3. I have asked her to get rid of Crocs which would not be a good shoe for wounds on the dorsal part of her toes with forefoot deformities. Electronic Signature(s) Signed: 08/10/2020 4:36:20 PM By: Linton Ham MD Entered By: Linton Ham on 08/10/2020 14:38:07 -------------------------------------------------------------------------------- SuperBill Details Patient Name: Date of Service: [facility] [name], [place] [name] 08/10/2020 Medical Record Number: [id number] Patient Account Number: [id number] Date of Birth/Sex: Treating RN: 07/02/1951 (70 y.o. [name], Meta.Reding Primary Care Provider: [name] Other Clinician: Referring Provider: Treating Provider/Extender: Rosezena Sensor in Treatment: 26 Diagnosis Coding ICD-10 Codes Code Description I87.2 Venous insufficiency (chronic) (peripheral) E11.621 Type 2 diabetes mellitus with foot ulcer L97.522 Non-pressure chronic ulcer of other part of left foot with fat layer exposed L97.512 Non-pressure chronic ulcer of other part of right foot with fat layer exposed N18.6 End stage renal disease Z99.2 Dependence on renal dialysis I10 Essential (primary) hypertension Facility Procedures CPT4 Code: [id number] Description: 99214 - WOUND CARE VISIT-LEV 4 EST  PT Modifier: Quantity: 1 Physician Procedures : CPT4 Code Description Modifier [id number] - WC PHYS LEVEL 3 - EST PT ICD-10 Diagnosis Description L97.522 Non-pressure chronic ulcer of other part of left foot with fat layer exposed L97.512 Non-pressure chronic ulcer of other part of right foot  with fat layer exposed E11.621 Type 2 diabetes mellitus with foot ulcer Quantity: 1 Electronic Signature(s) Signed: 08/10/2020 4:36:20 PM By: Linton Ham MD Entered By: Linton Ham on 08/10/2020 14:38:40

## 2020-08-14 ENCOUNTER — Encounter: Payer: Self-pay | Admitting: Family Medicine

## 2020-08-14 ENCOUNTER — Ambulatory Visit (INDEPENDENT_AMBULATORY_CARE_PROVIDER_SITE_OTHER): Payer: Medicare Other | Admitting: Family Medicine

## 2020-08-14 ENCOUNTER — Other Ambulatory Visit: Payer: Self-pay

## 2020-08-14 VITALS — BP 102/66 | HR 80 | Ht 67.0 in | Wt 195.0 lb

## 2020-08-14 DIAGNOSIS — R590 Localized enlarged lymph nodes: Secondary | ICD-10-CM

## 2020-08-14 DIAGNOSIS — Z794 Long term (current) use of insulin: Secondary | ICD-10-CM | POA: Diagnosis not present

## 2020-08-14 DIAGNOSIS — K6389 Other specified diseases of intestine: Secondary | ICD-10-CM

## 2020-08-14 DIAGNOSIS — E119 Type 2 diabetes mellitus without complications: Secondary | ICD-10-CM

## 2020-08-14 NOTE — Progress Notes (Signed)
Subjective:    Patient ID: [name], female    DOB: 21-May-1951, 70 y.o.   MRN: [id number]  Patient was in the emergency room in October for right lower quadrant pain.  At that time a CT scan revealed a mass in the ascending colon.  I set up the patient to see GI for a colonoscopy however she has canceled it twice and had to reschedule for various reasons.  In December, her urologist performed a CT scan of the chest abdomen and pelvis to follow-up her previous history of renal cell carcinoma.  There were 2 concerning findings.  First there is persistence of the mass in the ascending colon.  Is now 5 cm in diameter.  There was also an enlargement and a pretracheal lymph node.  I pulled up the CT scan from [facility].  On series 2, image 21, there is a roughly 2 cm enlarged lymph node behind the cardiac vasculature.  Patient is here to discuss these 2 findings further Past Medical History:  Diagnosis Date  . Anemia   . Anemia associated with chronic renal failure   . Anemia in chronic kidney disease 09/29/2018  . Arthritis   . Asthma   . Blood transfusion without reported diagnosis    Phreesia 02/27/2020  . Cancer ([place])    Phreesia 02/27/2020  . Cataract    OD  . Chronic kidney disease    Phreesia 02/27/2020  . Coronary artery calcification seen on CAT scan 02/17/2018   Coronary calcification on CT  . Depression   . Diabetes mellitus without complication ([place])    Phreesia 02/27/2020  . Diabetic retinopathy of both eyes ([place])   . Diabetic ulcer of right foot associated with type 2 diabetes mellitus ([place])   . ESRD (end stage renal disease) on dialysis ([place])   . Essential hypertension 02/17/2018   Essential hypertension  . GERD (gastroesophageal reflux disease)    pt denies  . HLD (hyperlipidemia)   . Hypertension   . Hypertensive retinopathy    OU  . Left ventricular dysfunction 04/07/2018   Left ventricle dysfunction  . Microalbuminuria due to type 2 diabetes mellitus ([name])    . Neuromuscular disorder ([place])    diabetic neuropathy  . Nonproliferative retinopathy due to secondary diabetes ([place])   . Nonsustained ventricular tachycardia ([place]) 08/28/2018   Nonsustained ventricular tachycardia  . Pneumonia    hx of   . Renal cell cancer ([place])   . Right renal mass 03/10/2017  . Type 2 diabetes, controlled, with neuropathy ([place]) 06/22/2013  . Ulcer of other part of foot 06/22/2013  . 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 AMPUTATION TOE Right 10/29/2019   Procedure: RIGHT 2ND TOE AMPUTATION;  Surgeon: Edrick Kins, DPM;  Location: [facility];  Service: Podiatry;  Laterality: Right;  . BASCILIC VEIN TRANSPOSITION Left 08/08/2017   Procedure: BASILIC VEIN TRANSPOSITION FIRST STAGE LEFT ARM;  Surgeon: Serafina Mitchell, MD;  Location: [place];  Service: Vascular;  Laterality: Left;  . BASCILIC VEIN TRANSPOSITION Left 05/14/2018   Procedure: SECOND STAGE BASILIC VEIN TRANSPOSITION LEFT ARM;  Surgeon: Serafina Mitchell, MD;  Location: [place];  Service: Vascular;  Laterality: Left;  . CATARACT EXTRACTION Left   . EYE SURGERY    . ROBOTIC ADRENALECTOMY Left 03/10/2017   Procedure: XI ROBOTIC ADRENALECTOMY;  Surgeon: Nickie Retort, MD;  Location: [facility];  Service: Urology;  Laterality: Left;  . ROBOTIC ASSITED PARTIAL NEPHRECTOMY Right 03/10/2017   Procedure: XI ROBOTIC ASSITED RADICAL NEPHRECTOMY;  Surgeon: Nickie Retort, MD;  Location: [facility];  Service: Urology;  Laterality: Right;  . TOE AMPUTATION Bilateral    both great toe ,, left foot 2nd toe 1/2   Current Outpatient Medications on File Prior to Visit  Medication Sig Dispense Refill  . Blood Glucose Monitoring Suppl (ONE TOUCH ULTRA 2) w/Device KIT Use to check BS BID-QID Dx:E11.9 1 each 1  . BREO ELLIPTA 100-25 MCG/INH AEPB Inhale 1 puff by mouth once daily 180 each 0  . Carboxymethylcellulose Sodium (THERATEARS OP) Place 1 drop into  both eyes daily as needed (dry eyes).    . gabapentin (NEURONTIN) 300 MG capsule TAKE 1 CAPSULE BY MOUTH THREE TIMES DAILY. REQUIRES OFFICE VISIT BEFORE ANY FURTHER REFILLS CAN BE GIVEN 90 capsule 0  . insulin degludec (TRESIBA FLEXTOUCH) 100 UNIT/ML FlexTouch Pen Inject 0.3 mLs (30 Units total) into the skin daily. Hold if fasting blood sugars are <130. 45 mL 0  . Insulin Pen Needle (LIVE BETTER PEN NEEDLES) 31G X 6 MM MISC To use with Tresiba pens daily 100 each 3  . Lancets (ONETOUCH ULTRASOFT) lancets Use as instructed 300 each 3  . multivitamin (RENA-VIT) TABS tablet Take 1 tablet by mouth daily.    Glory Rosebush ULTRA test strip USE AS DIRECTED TO MONITOR  FSBS 3 TIMES DAILY 300 strip 3  . oxyCODONE-acetaminophen (PERCOCET) 5-325 MG tablet Take 1 tablet by mouth every 6 (six) hours as needed for severe pain. 30 tablet 0  . PROAIR HFA 108 (90 Base) MCG/ACT inhaler INHALE 2 PUFFS BY MOUTH EVERY 6 HOURS AS NEEDED FOR WHEEZING FOR SHORTNESS OF BREATH 9 g 0  . rosuvastatin (CRESTOR) 20 MG tablet Take 1 tablet (20 mg total) by mouth daily. 90 tablet 3  . diphenhydrAMINE (BENADRYL) 25 MG tablet Take 25 mg by mouth daily as needed for allergies. (Patient not taking: Reported on 08/14/2020)     Current Facility-Administered Medications on File Prior to Visit  Medication Dose Route Frequency Provider Last Rate Last Admin  . Bevacizumab (AVASTIN) SOLN 1.25 mg  1.25 mg Intravitreal  [name], MD   1.25 mg at 05/12/18 1319  . Bevacizumab (AVASTIN) SOLN 1.25 mg  1.25 mg Intravitreal  [name], MD   1.25 mg at 06/13/18 0055  . Bevacizumab (AVASTIN) SOLN 1.25 mg  1.25 mg Intravitreal  [name], MD   1.25 mg at 07/10/18 1308  . Bevacizumab (AVASTIN) SOLN 1.25 mg  1.25 mg Intravitreal  [name], MD   1.25 mg at 08/21/18 1312  . Bevacizumab (AVASTIN) SOLN 1.25 mg  1.25 mg Intravitreal  [name], MD   1.25 mg at 10/02/18 1510   Allergies  Allergen Reactions  . Penicillins Other (See  Comments)    UNSPECIFIED CHILDHOOD REACTION  Has patient had a PCN reaction causing immediate rash, facial/tongue/throat swelling, SOB or lightheadedness with hypotension: Unknown Has patient had a PCN reaction causing severe rash involving mucus membranes or skin necrosis: Unknown Has patient had a PCN reaction that required hospitalization: Unknown Has patient had a PCN reaction occurring within the last 10 years: Unknown If all of the above answers are "NO", then may proceed with Cephalosporin use.   . Adhesive [Tape] Rash   Social History   Socioeconomic History  . Marital status: Single    Spouse name: Not on file  . Number of children: 2  .  Years of education: 55  . Highest education level: Not on file  Occupational History  . Not on file  Tobacco Use  . Smoking status: Never Smoker  . Smokeless tobacco: Never Used  Vaping Use  . Vaping Use: Never used  Substance and Sexual Activity  . Alcohol use: Yes    Alcohol/week: 0.0 standard drinks    Comment: occ  . Drug use: No  . Sexual activity: Not Currently    Partners: Male  Other Topics Concern  . Not on file  Social History Narrative  . Not on file   Social Determinants of Health   Financial Resource Strain: Low Risk   . Difficulty of Paying Living Expenses: Not very hard  Food Insecurity: Not on file  Transportation Needs: Not on file  Physical Activity: Not on file  Stress: Not on file  Social Connections: Not on file  Intimate Partner Violence: Not on file     Review of Systems  Gastrointestinal: Positive for abdominal pain.  All other systems reviewed and are negative.      Objective:   Physical Exam Vitals reviewed.  Constitutional:      General: She is not in acute distress.    Appearance: She is well-developed. She is not diaphoretic.  Cardiovascular:     Rate and Rhythm: Normal rate and regular rhythm.     Heart sounds: Normal heart sounds.  Pulmonary:     Effort: Pulmonary effort is  normal. No respiratory distress.     Breath sounds: Normal breath sounds. No wheezing or rales.  Abdominal:     General: Bowel sounds are normal. There is no distension.     Palpations: Abdomen is soft.     Tenderness: There is no abdominal tenderness. There is no guarding or rebound.           Assessment & Plan:   Diabetes mellitus, type II, insulin dependent ([place]) - Plan: Hemoglobin A1c  Colonic mass  Lymphadenopathy, mediastinal  While the patient is here I will obtain an A1c to evaluate the control of her diabetes.  However I spent more than 25 minutes today with the patient explaining the 2 lesions on the CT scan.  First I told the patient that is highly likely that she has colon cancer in the ascending colon.  If we continue to delay, it would likely spread and become untreatable.  Therefore I have insisted that she see GI.  She reluctantly agrees.  Second I explained that the enlarged lymph node seen on the CT scan is a concerning finding.  However is in a very difficult area to biopsy.  We can certainly get a PET scan however I would like to first have her discuss the situation with the cardiothoracic surgeon.  I do not believe this lesion would be amenable to biopsy through mediastinoscopy.  I am not sure if they could do a bronc with an ultrasound-guided biopsy.  Therefore I would like their expert opinion.  If this is metastatic cancer, it would likely determine the treatment recommendations for the colon mass as well.

## 2020-08-15 ENCOUNTER — Encounter: Payer: Self-pay | Admitting: Urology

## 2020-08-15 DIAGNOSIS — D689 Coagulation defect, unspecified: Secondary | ICD-10-CM | POA: Diagnosis not present

## 2020-08-15 DIAGNOSIS — D631 Anemia in chronic kidney disease: Secondary | ICD-10-CM | POA: Diagnosis not present

## 2020-08-15 DIAGNOSIS — R197 Diarrhea, unspecified: Secondary | ICD-10-CM | POA: Diagnosis not present

## 2020-08-15 DIAGNOSIS — N186 End stage renal disease: Secondary | ICD-10-CM | POA: Diagnosis not present

## 2020-08-15 DIAGNOSIS — D509 Iron deficiency anemia, unspecified: Secondary | ICD-10-CM | POA: Diagnosis not present

## 2020-08-15 DIAGNOSIS — Z992 Dependence on renal dialysis: Secondary | ICD-10-CM | POA: Diagnosis not present

## 2020-08-15 DIAGNOSIS — E1129 Type 2 diabetes mellitus with other diabetic kidney complication: Secondary | ICD-10-CM | POA: Diagnosis not present

## 2020-08-15 DIAGNOSIS — N2581 Secondary hyperparathyroidism of renal origin: Secondary | ICD-10-CM | POA: Diagnosis not present

## 2020-08-15 LAB — HEMOGLOBIN A1C
Hgb A1c MFr Bld: 7.6 % of total Hgb — ABNORMAL HIGH (ref <5.7)
Mean Plasma Glucose: 171 mg/dL
eAG (mmol/L): 9.5 mmol/L

## 2020-08-17 ENCOUNTER — Other Ambulatory Visit (HOSPITAL_BASED_OUTPATIENT_CLINIC_OR_DEPARTMENT_OTHER): Payer: Self-pay | Admitting: Internal Medicine

## 2020-08-17 ENCOUNTER — Encounter (HOSPITAL_BASED_OUTPATIENT_CLINIC_OR_DEPARTMENT_OTHER): Payer: Medicare Other | Admitting: Internal Medicine

## 2020-08-17 ENCOUNTER — Other Ambulatory Visit: Payer: Self-pay

## 2020-08-17 DIAGNOSIS — Z89411 Acquired absence of right great toe: Secondary | ICD-10-CM | POA: Diagnosis not present

## 2020-08-17 DIAGNOSIS — E1140 Type 2 diabetes mellitus with diabetic neuropathy, unspecified: Secondary | ICD-10-CM | POA: Diagnosis not present

## 2020-08-17 DIAGNOSIS — M868X7 Other osteomyelitis, ankle and foot: Secondary | ICD-10-CM | POA: Diagnosis not present

## 2020-08-17 DIAGNOSIS — I120 Hypertensive chronic kidney disease with stage 5 chronic kidney disease or end stage renal disease: Secondary | ICD-10-CM | POA: Diagnosis not present

## 2020-08-17 DIAGNOSIS — L97512 Non-pressure chronic ulcer of other part of right foot with fat layer exposed: Secondary | ICD-10-CM | POA: Diagnosis not present

## 2020-08-17 DIAGNOSIS — L97526 Non-pressure chronic ulcer of other part of left foot with bone involvement without evidence of necrosis: Secondary | ICD-10-CM | POA: Diagnosis not present

## 2020-08-17 DIAGNOSIS — D509 Iron deficiency anemia, unspecified: Secondary | ICD-10-CM | POA: Diagnosis not present

## 2020-08-17 DIAGNOSIS — Z880 Allergy status to penicillin: Secondary | ICD-10-CM | POA: Diagnosis not present

## 2020-08-17 DIAGNOSIS — R197 Diarrhea, unspecified: Secondary | ICD-10-CM | POA: Diagnosis not present

## 2020-08-17 DIAGNOSIS — Z89412 Acquired absence of left great toe: Secondary | ICD-10-CM | POA: Diagnosis not present

## 2020-08-17 DIAGNOSIS — E11621 Type 2 diabetes mellitus with foot ulcer: Secondary | ICD-10-CM | POA: Diagnosis not present

## 2020-08-17 DIAGNOSIS — Z1624 Resistance to multiple antibiotics: Secondary | ICD-10-CM | POA: Diagnosis not present

## 2020-08-17 DIAGNOSIS — E1129 Type 2 diabetes mellitus with other diabetic kidney complication: Secondary | ICD-10-CM | POA: Diagnosis not present

## 2020-08-17 DIAGNOSIS — I872 Venous insufficiency (chronic) (peripheral): Secondary | ICD-10-CM | POA: Diagnosis not present

## 2020-08-17 DIAGNOSIS — Z89421 Acquired absence of other right toe(s): Secondary | ICD-10-CM | POA: Diagnosis not present

## 2020-08-17 DIAGNOSIS — D631 Anemia in chronic kidney disease: Secondary | ICD-10-CM | POA: Diagnosis not present

## 2020-08-17 DIAGNOSIS — N2581 Secondary hyperparathyroidism of renal origin: Secondary | ICD-10-CM | POA: Diagnosis not present

## 2020-08-17 DIAGNOSIS — E1122 Type 2 diabetes mellitus with diabetic chronic kidney disease: Secondary | ICD-10-CM | POA: Diagnosis not present

## 2020-08-17 DIAGNOSIS — B952 Enterococcus as the cause of diseases classified elsewhere: Secondary | ICD-10-CM | POA: Diagnosis not present

## 2020-08-17 DIAGNOSIS — Z89422 Acquired absence of other left toe(s): Secondary | ICD-10-CM | POA: Diagnosis not present

## 2020-08-17 DIAGNOSIS — B957 Other staphylococcus as the cause of diseases classified elsewhere: Secondary | ICD-10-CM | POA: Diagnosis not present

## 2020-08-17 DIAGNOSIS — L97522 Non-pressure chronic ulcer of other part of left foot with fat layer exposed: Secondary | ICD-10-CM | POA: Diagnosis not present

## 2020-08-17 DIAGNOSIS — N186 End stage renal disease: Secondary | ICD-10-CM | POA: Diagnosis not present

## 2020-08-17 DIAGNOSIS — Z1611 Resistance to penicillins: Secondary | ICD-10-CM | POA: Diagnosis not present

## 2020-08-17 DIAGNOSIS — Z992 Dependence on renal dialysis: Secondary | ICD-10-CM | POA: Diagnosis not present

## 2020-08-17 DIAGNOSIS — D689 Coagulation defect, unspecified: Secondary | ICD-10-CM | POA: Diagnosis not present

## 2020-08-17 NOTE — Progress Notes (Addendum)
BEATRYCE, COLOMBO ([id number]) Visit Report for 08/17/2020 Arrival Information Details Patient Name: Date of Service: [name], [name] 08/17/2020 2:45 PM Medical Record Number: [id number] Patient Account Number: [id number] Date of Birth/Sex: Treating RN: 10/17/1950 (70 y.o. [name] Primary Care Jabria Loos: [name] Other Clinician: Referring Elfego Giammarino: Treating Lekeshia Kram/Extender: Rosezena Sensor in Treatment: 27 Visit Information History Since Last Visit Added or deleted any medications: No Patient Arrived: Wheel Chair Any new allergies or adverse reactions: No Arrival Time: 14:43 Had a fall or experienced change in No Accompanied By: alone activities of daily living that may affect Transfer Assistance: None risk of falls: Patient Identification Verified: Yes Signs or symptoms of abuse/neglect since last visito No Secondary Verification Process Completed: Yes Hospitalized since last visit: No Patient Requires Transmission-Based Precautions: No Implantable device outside of the clinic excluding No Patient Has Alerts: Yes cellular tissue based products placed in the center Patient Alerts: Right ABI: [place] since last visit: Left ABI: [place] Has Dressing in Place as Prescribed: Yes Has Compression in Place as Prescribed: Yes Pain Present Now: No Electronic Signature(s) Signed: 08/17/2020 5:48:06 PM By: Levan Hurst RN, BSN Entered By: Levan Hurst on 08/17/2020 14:43:24 -------------------------------------------------------------------------------- Encounter Discharge Information Details Patient Name: Date of Service: [name], [place] [name] 08/17/2020 2:45 PM Medical Record Number: [id number] Patient Account Number: [id number] Date of Birth/Sex: Treating RN: [birth year] (70 y.o. [name] Primary Care Brit Carbonell: [name] Other Clinician: Referring Tyce Delcid: Treating Luisdavid Hamblin/Extender: Rosezena Sensor in Treatment:  27 Encounter Discharge Information Items Post Procedure Vitals Discharge Condition: Stable Temperature (F): 98.4 Ambulatory Status: Wheelchair Pulse (bpm): 68 Discharge Destination: Home Respiratory Rate (breaths/min): 18 Transportation: Private Auto Blood Pressure (mmHg): 98/38 Accompanied By: self Schedule Follow-up Appointment: Yes Clinical Summary of Care: Patient Declined Electronic Signature(s) Signed: 08/17/2020 5:45:53 PM By: [name] RN, BSN Entered By: [name] on 08/17/2020 16:49:18 -------------------------------------------------------------------------------- Lower Extremity Assessment Details Patient Name: Date of Service: [facility] [name], [place] [name] 08/17/2020 2:45 PM Medical Record Number: [id number] Patient Account Number: [id number] Date of Birth/Sex: Treating RN: January 05, 1951 (70 y.o. [name] Primary Care Avyanna Spada: [name] Other Clinician: Referring Kerianna Rawlinson: Treating Bentzion Dauria/Extender: Rosezena Sensor in Treatment: 27 Edema Assessment Assessed: [name]: No] Patrice Paradise: No] Edema: [Left: No] [Right: No] Calf Left: Right: Point of Measurement: 38 cm From Medial Instep 34 cm 33 cm Ankle Left: Right: Point of Measurement: 13 cm From Medial Instep 23 cm 23 cm Vascular Assessment Pulses: Dorsalis Pedis Palpable: [Left:Yes] [Right:Yes] Electronic Signature(s) Signed: 08/17/2020 5:48:06 PM By: Levan Hurst RN, BSN Entered By: Levan Hurst on 08/17/2020 14:54:38 -------------------------------------------------------------------------------- Multi Wound Chart Details Patient Name: Date of Service: [name], [place] [name] 08/17/2020 2:45 PM Medical Record Number: [id number] Patient Account Number: [id number] Date of Birth/Sex: Treating RN: Jan 28, 1951 (70 y.o. [name], [name] Primary Care Rayvon Dakin: [name] Other Clinician: Referring Manoj Enriquez: Treating Lavelle Akel/Extender: Rosezena Sensor in  Treatment: 27 Vital Signs Height(in): 41 Pulse(bpm): 53 Weight(lbs): 69 Blood Pressure(mmHg): 98/38 Body Mass Index(BMI): 32 Temperature(F): 98.4 Respiratory Rate(breaths/min): 16 Photos: [19:No Photos Right T Third oe] [8:No 17 Left T Third oe] [N/A:N/A N/A] Wound Location: [19:Footwear Injury] [8:Blister] [N/A:N/A] Wounding Event: [19:Diabetic Wound/Ulcer of the Lower] [8:Diabetic Wound/Ulcer of the Lower] [N/A:N/A] Primary Etiology: [19:Extremity Cataracts, Anemia, Asthma,] [8:Extremity Cataracts, Anemia, Asthma,] [N/A:N/A] Comorbid History: [19:Hypertension, Type II Diabetes, End Stage Renal Disease, Osteomyelitis, Neuropathy 06/25/2020] [8:Hypertension, Type II Diabetes, End Stage Renal Disease, Osteomyelitis, Neuropathy 02/23/2020] [N/A:N/A] Date  Acquired: [19:7] [8:25] [N/A:N/A] Weeks of Treatment: [19:Open] [8:Open] [N/A:N/A] Wound Status: [19:1.5x1.6x0.2] [8:0.6x0.5x0.1] [N/A:N/A] Measurements L x W x D (cm) [19:1.885] [8:0.236] [N/A:N/A] A (cm) : rea [19:0.377] [8:0.024] [N/A:N/A] Volume (cm) : [19:-585.50%] [8:-67.40%] [N/A:N/A] % Reduction in A rea: [19:-1296.30%] [8:-71.40%] [N/A:N/A] % Reduction in Volume: [19:Grade 2] [8:Grade 2] [N/A:N/A] Classification: [19:Medium] [8:Medium] [N/A:N/A] Exudate A mount: [19:Serosanguineous] [8:Serosanguineous] [N/A:N/A] Exudate Type: [19:red, brown] [8:red, brown] [N/A:N/A] Exudate Color: [19:Distinct, outline attached] [8:Flat and Intact] [N/A:N/A] Wound Margin: [19:Medium (34-66%)] [8:Small (1-33%)] [N/A:N/A] Granulation A mount: [19:Pink, Pale] [8:Red] [N/A:N/A] Granulation Quality: [19:Medium (34-66%)] [8:Large (67-100%)] [N/A:N/A] Necrotic A mount: [19:Fat Layer (Subcutaneous Tissue): Yes Fat Layer (Subcutaneous Tissue): Yes N/A] Exposed Structures: [19:Bone: Yes Fascia: No Tendon: No Muscle: No Joint: No None] [8:Fascia: No Tendon: No Muscle: No Joint: No Bone: No Medium (34-66%)] [N/A:N/A] Epithelialization: [19:N/A]  [8:Debridement - Excisional] [N/A:N/A] Debridement: Pre-procedure Verification/Time Out N/A [8:15:10] [N/A:N/A] Taken: [19:N/A] [8:Lidocaine 4% Topical Solution] [N/A:N/A] Pain Control: [19:N/A] [8:Bone, Subcutaneous, Slough] [N/A:N/A] Tissue Debrided: [19:N/A] [8:Skin/Subcutaneous] [N/A:N/A] Level: [19:N/A] [8:Tissue/Muscle/Bone 0.3] [N/A:N/A] Debridement A (sq cm): [19:rea N/A] [8:Curette] [N/A:N/A] Instrument: [19:N/A] [8:Minimum] [N/A:N/A] Bleeding: [19:N/A] [8:Pressure] [N/A:N/A] Hemostasis Achieved: [19:N/A] [8:0] [N/A:N/A] Procedural Pain: [19:N/A] [8:0] [N/A:N/A] Post Procedural Pain: [19:N/A] [8:Procedure was tolerated well] [N/A:N/A] Debridement Treatment Response: [19:N/A] [8:0.6x0.5x0.1] [N/A:N/A] Post Debridement Measurements L x W x D (cm) [19:N/A] [8:0.024] [N/A:N/A] Post Debridement Volume: (cm) [19:N/A] [8:Debridement] [N/A:N/A] Treatment Notes Electronic Signature(s) Signed: 08/17/2020 4:56:03 PM By: Linton Ham MD Signed: 08/17/2020 5:42:01 PM By: [name] Entered By: Linton Ham on 08/17/2020 15:17:52 -------------------------------------------------------------------------------- Multi-Disciplinary Care Plan Details Patient Name: Date of Service: [facility] [name], [place] [name] 08/17/2020 2:45 PM Medical Record Number: [id number] Patient Account Number: [id number] Date of Birth/Sex: Treating RN: 10-03-1950 (70 y.o. [name] Primary Care Tensley Wery: [name] Other Clinician: Referring Katelyn Broadnax: Treating Porcha Deblanc/Extender: Rosezena Sensor in Treatment: 27 Active Inactive Electronic Signature(s) Signed: 09/06/2020 11:40:16 AM By: [name] Previous Signature: 08/17/2020 5:42:01 PM Version By: [name] Entered By: [name] on 09/06/2020 11:40:16 -------------------------------------------------------------------------------- Pain Assessment Details Patient Name: Date of Service: [name], [place] [name] 08/17/2020 2:45  PM Medical Record Number: [id number] Patient Account Number: [id number] Date of Birth/Sex: Treating RN: [birth year] (70 y.o. [name] Primary Care Joliene Salvador: [name] Other Clinician: Referring Hiilei Gerst: Treating Yahmir Sokolov/Extender: Rosezena Sensor in Treatment: 27 Active Problems Location of Pain Severity and Description of Pain Patient Has Paino No Site Locations Pain Management and Medication Current Pain Management: Electronic Signature(s) Signed: 08/17/2020 5:48:06 PM By: Levan Hurst RN, BSN Entered By: Levan Hurst on 08/17/2020 14:43:59 -------------------------------------------------------------------------------- Patient/Caregiver Education Details Patient Name: Date of Service: [facility] [name], [place] [name] 1/27/2022andnbsp2:45 PM Medical Record Number: [id number] Patient Account Number: [id number] Date of Birth/Gender: Treating RN: 1951/03/20 (70 y.o. [name] Primary Care Physician: [name] Other Clinician: Referring Physician: Treating Physician/Extender: Rosezena Sensor in Treatment: 27 Education Assessment Education Provided To: Patient Education Topics Provided Venous: Handouts: Managing Venous Disease and Related Ulcers Methods: Explain/Verbal Responses: Reinforcements needed Electronic Signature(s) Signed: 08/17/2020 5:42:01 PM By: [name] Entered By: [name] on 08/17/2020 15:04:03 -------------------------------------------------------------------------------- Wound Assessment Details Patient Name: Date of Service: [name], [place] [name] 08/17/2020 2:45 PM Medical Record Number: [id number] Patient Account Number: [id number] Date of Birth/Sex: Treating RN: 10-31-1950 (70 y.o. [name] Primary Care Treveon Bourcier: [name] Other Clinician: Referring Anahit Klumb: Treating Missie Gehrig/Extender: Rosezena Sensor in Treatment: 27 Wound Status Wound  Number: 19 Primary  Diabetic Wound/Ulcer of the Lower Extremity Etiology: Wound Location: Right T Third oe Wound Open Wounding Event: Footwear Injury Status: Date Acquired: 06/25/2020 Comorbid Cataracts, Anemia, Asthma, Hypertension, Type II Diabetes, End Weeks Of Treatment: 7 History: Stage Renal Disease, Osteomyelitis, Neuropathy Clustered Wound: No Photos Photo Uploaded By: Mikeal Hawthorne on 08/24/2020 09:25:16 Wound Measurements Length: (cm) 1.5 Width: (cm) 1.6 Depth: (cm) 0.2 Area: (cm) 1.885 Volume: (cm) 0.377 % Reduction in Area: -585.5% % Reduction in Volume: -1296.3% Epithelialization: None Tunneling: No Undermining: No Wound Description Classification: Grade 2 Wound Margin: Distinct, outline attached Exudate Amount: Medium Exudate Type: Serosanguineous Exudate Color: red, brown Foul Odor After Cleansing: No Slough/Fibrino Yes Wound Bed Granulation Amount: Medium (34-66%) Exposed Structure Granulation Quality: Pink, Pale Fascia Exposed: No Necrotic Amount: Medium (34-66%) Fat Layer (Subcutaneous Tissue) Exposed: Yes Necrotic Quality: Adherent Slough Tendon Exposed: No Muscle Exposed: No Joint Exposed: No Bone Exposed: Yes Electronic Signature(s) Signed: 08/17/2020 5:48:06 PM By: Levan Hurst RN, BSN Entered By: Levan Hurst on 08/17/2020 14:54:01 -------------------------------------------------------------------------------- Wound Assessment Details Patient Name: Date of Service: [name], [place] [name] 08/17/2020 2:45 PM Medical Record Number: [id number] Patient Account Number: [id number] Date of Birth/Sex: Treating RN: 08/28/1950 (70 y.o. [name] Primary Care Tavionna Grout: [name] Other Clinician: Referring Gerilyn Stargell: Treating Jomes Giraldo/Extender: Rosezena Sensor in Treatment: 27 Wound Status Wound Number: 8 Primary Diabetic Wound/Ulcer of the Lower Extremity Etiology: Wound Location: Left T Third oe Wound  Open Wounding Event: Blister Status: Date Acquired: 02/23/2020 Comorbid Cataracts, Anemia, Asthma, Hypertension, Type II Diabetes, End Weeks Of Treatment: 25 History: Stage Renal Disease, Osteomyelitis, Neuropathy Clustered Wound: No Photos Photo Uploaded By: Mikeal Hawthorne on 08/24/2020 09:24:51 Wound Measurements Length: (cm) 0.6 Width: (cm) 0.5 Depth: (cm) 0.1 Area: (cm) 0.236 Volume: (cm) 0.024 % Reduction in Area: -67.4% % Reduction in Volume: -71.4% Epithelialization: Medium (34-66%) Tunneling: No Undermining: No Wound Description Classification: Grade 2 Wound Margin: Flat and Intact Exudate Amount: Medium Exudate Type: Serosanguineous Exudate Color: red, brown Foul Odor After Cleansing: No Slough/Fibrino Yes Wound Bed Granulation Amount: Small (1-33%) Exposed Structure Granulation Quality: Red Fascia Exposed: No Necrotic Amount: Large (67-100%) Fat Layer (Subcutaneous Tissue) Exposed: Yes Necrotic Quality: Adherent Slough Tendon Exposed: No Muscle Exposed: No Joint Exposed: No Bone Exposed: No Electronic Signature(s) Signed: 08/17/2020 5:48:06 PM By: Levan Hurst RN, BSN Entered By: Levan Hurst on 08/17/2020 14:54:25 -------------------------------------------------------------------------------- [place] Details Patient Name: Date of Service: [name], [place] [name] 08/17/2020 2:45 PM Medical Record Number: [id number] Patient Account Number: [id number] Date of Birth/Sex: Treating RN: 1951-06-08 (70 y.o. [name] Primary Care Elice Crigger: [name] Other Clinician: Referring Steward Sames: Treating Alexie Samson/Extender: Rosezena Sensor in Treatment: 27 Vital Signs Time Taken: 14:46 Temperature (F): 98.4 Height (in): 67 Pulse (bpm): 68 Weight (lbs): 202 Respiratory Rate (breaths/min): 16 Body Mass Index (BMI): 31.6 Blood Pressure (mmHg): 98/38 Reference Range: 80 - 120 mg / dl Electronic Signature(s) Signed: 08/17/2020  5:48:06 PM By: Levan Hurst RN, BSN Entered By: Levan Hurst on 08/17/2020 14:48:45

## 2020-08-18 ENCOUNTER — Encounter: Payer: Self-pay | Admitting: Podiatry

## 2020-08-18 ENCOUNTER — Ambulatory Visit: Payer: Medicare Other | Admitting: Podiatry

## 2020-08-18 DIAGNOSIS — L97522 Non-pressure chronic ulcer of other part of left foot with fat layer exposed: Secondary | ICD-10-CM | POA: Diagnosis not present

## 2020-08-18 DIAGNOSIS — M869 Osteomyelitis, unspecified: Secondary | ICD-10-CM

## 2020-08-18 MED ORDER — DOXYCYCLINE HYCLATE 100 MG PO TABS: 100.0000 mg | ORAL_TABLET | Freq: Two times a day (BID) | ORAL | 0 refills | Status: DC

## 2020-08-18 NOTE — Patient Instructions (Signed)
Pre-Operative Instructions  Congratulations, you have decided to take an important step towards improving your quality of life.  You can be assured that the doctors and staff at [facility] will be with you every step of the way.  Here are some important things you should know:  1. Plan to be at the surgery center/hospital at least 1 (one) hour prior to your scheduled time, unless otherwise directed by the surgical center/hospital staff.  You must have a responsible adult accompany you, remain during the surgery and drive you home.  Make sure you have directions to the surgical center/hospital to ensure you arrive on time. 2. If you are having surgery at [facility] or [facility] hospitals, you will need a copy of your medical history and physical form from your family physician within one month prior to the date of surgery. We will give you a form for your primary physician to complete.  3. We make every effort to accommodate the date you request for surgery.  However, there are times where surgery dates or times have to be moved.  We will contact you as soon as possible if a change in schedule is required.   4. No aspirin/ibuprofen for one week before surgery.  If you are on aspirin, any non-steroidal anti-inflammatory medications (Mobic, Aleve, Ibuprofen) should not be taken seven (7) days prior to your surgery.  You make take Tylenol for pain prior to surgery.  5. Medications - If you are taking daily heart and blood pressure medications, seizure, reflux, allergy, asthma, anxiety, pain or diabetes medications, make sure you notify the surgery center/hospital before the day of surgery so they can tell you which medications you should take or avoid the day of surgery. 6. No food or drink after midnight the night before surgery unless directed otherwise by surgical center/hospital staff. 7. No alcoholic beverages 24-hours prior to surgery.  No smoking 24-hours prior or 24-hours after  surgery. 8. Wear loose pants or shorts. They should be loose enough to fit over bandages, boots, and casts. 9. Don't wear slip-on shoes. Sneakers are preferred. 10. Bring your boot with you to the surgery center/hospital.  Also bring crutches or a walker if your physician has prescribed it for you.  If you do not have this equipment, it will be provided for you after surgery. 11. If you have not been contacted by the surgery center/hospital by the day before your surgery, call to confirm the date and time of your surgery. 12. Leave-time from work may vary depending on the type of surgery you have.  Appropriate arrangements should be made prior to surgery with your employer. 13. Prescriptions will be provided immediately following surgery by your doctor.  Fill these as soon as possible after surgery and take the medication as directed. Pain medications will not be refilled on weekends and must be approved by the doctor. 14. Remove nail polish on the operative foot and avoid getting pedicures prior to surgery. 15. Wash the night before surgery.  The night before surgery wash the foot and leg well with water and the antibacterial soap provided. Be sure to pay special attention to beneath the toenails and in between the toes.  Wash for at least three (3) minutes. Rinse thoroughly with water and dry well with a towel.  Perform this wash unless told not to do so by your physician.  Enclosed: 1 Ice pack (please put in freezer the night before surgery)   1 Hibiclens skin cleaner     Pre-op instructions  If you have any questions regarding the instructions, please do not hesitate to call our office.  [place]: [address], [place], [place] [address] -- [phone]  [place]: [address] [place], [place] [address] -- [phone]  [place]: [address], [place], [place] [address] -- [phone]   Website: [email or web address] 

## 2020-08-18 NOTE — Progress Notes (Signed)
[name], [name] ([id number]) Visit Report for 08/17/2020 Debridement Details Patient Name: Date of Service: [name], [name] 08/17/2020 2:45 PM Medical Record Number: [id number] Patient Account Number: [id number] Date of Birth/Sex: Treating RN: 07-04-1951 (70 y.o. [name], Meta.Reding Primary Care Provider: Jenna [name] Other Clinician: Referring Provider: Treating Provider/Extender: [name] in Treatment: 27 Debridement Performed for Assessment: Wound #8 Left T Third oe Performed By: Physician [name]., MD Debridement Type: Debridement Severity of Tissue Pre Debridement: Bone involvement without necrosis Level of Consciousness (Pre-procedure): Awake and Alert Pre-procedure Verification/Time Out Yes - 15:10 Taken: Start Time: 15:11 Pain Control: Lidocaine 4% T opical Solution T Area Debrided (L x W): otal 0.6 (cm) x 0.5 (cm) = 0.3 (cm) Tissue and other material debrided: Viable, Non-Viable, Bone, Slough, Subcutaneous, Skin: Dermis , Skin: Epidermis, Fibrin/Exudate, Slough Level: Skin/Subcutaneous Tissue/Muscle/Bone Debridement Description: Excisional Instrument: Curette Bleeding: Minimum Hemostasis Achieved: Pressure End Time: 15:15 Procedural Pain: 0 Post Procedural Pain: 0 Response to Treatment: Procedure was tolerated well Level of Consciousness (Post- Awake and Alert procedure): Post Debridement Measurements of Total Wound Length: (cm) 0.6 Width: (cm) 0.5 Depth: (cm) 0.1 Volume: (cm) 0.024 Character of Wound/Ulcer Post Debridement: Improved Severity of Tissue Post Debridement: Bone involvement without necrosis Post Procedure Diagnosis Same as Pre-procedure Electronic Signature(s) Signed: 08/17/2020 4:56:03 PM By: [name] Ham MD Signed: 08/17/2020 5:42:01 PM By: [name] Entered By: [name] on 08/17/2020 15:18:08 -------------------------------------------------------------------------------- Debridement  Details Patient Name: Date of Service: [name], [name] [name] 08/17/2020 2:45 PM Medical Record Number: [id number] Patient Account Number: [id number] Date of Birth/Sex: Treating RN: May 27, 1951 (70 y.o. [name], Meta.Reding Primary Care Provider: Jenna [name] Other Clinician: Referring Provider: Treating Provider/Extender: [name] in Treatment: 27 Debridement Performed for Assessment: Wound #19 Right T Third oe Performed By: Physician [name]., MD Debridement Type: Debridement Severity of Tissue Pre Debridement: Bone involvement without necrosis Level of Consciousness (Pre-procedure): Awake and Alert Pre-procedure Verification/Time Out Yes - 15:10 Taken: Start Time: 15:11 Pain Control: Lidocaine 4% T opical Solution T Area Debrided (L x W): otal 0.2 (cm) x 0.2 (cm) = 0.04 (cm) Tissue and other material debrided: Viable, Bone Level: Skin/Subcutaneous Tissue/Muscle/Bone Debridement Description: Excisional Instrument: Rongeur Specimen: Swab, Number of Specimens T aken: 2 Bleeding: Minimum Hemostasis Achieved: Pressure End Time: 15:15 Procedural Pain: 0 Post Procedural Pain: 0 Response to Treatment: Procedure was tolerated well Level of Consciousness (Post- Awake and Alert procedure): Post Debridement Measurements of Total Wound Length: (cm) 1.5 Width: (cm) 1.6 Depth: (cm) 0.2 Volume: (cm) 0.377 Character of Wound/Ulcer Post Debridement: Improved Severity of Tissue Post Debridement: Bone involvement without necrosis Post Procedure Diagnosis Same as Pre-procedure Electronic Signature(s) Signed: 08/17/2020 4:56:03 PM By: [name] Ham MD Signed: 08/17/2020 5:42:01 PM By: [name] Entered By: [name] on 08/17/2020 15:22:42 -------------------------------------------------------------------------------- HPI Details Patient Name: Date of Service: [name], [name] [name] 08/17/2020 2:45 PM Medical Record Number: [id number] Patient  Account Number: [id number] Date of Birth/Sex: Treating RN: [birth year] (70 y.o. [name] Primary Care Provider: Jenna [name] Other Clinician: Referring Provider: Treating Provider/Extender: [name] in Treatment: 27 History of Present Illness HPI Description: Evaluate7/21/2021 today patient presents for evaluation of ulcerations on her legs which she tells me tend to come and go as far as small blistering locations and sometimes are better than other times. Right now she tells me that this is actually doing a little bit better but nonetheless will not completely go  away. She has ulcerations bilaterally on her lower extremities. The right is worse than the left. 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 The patient has no evidence of active infection at this time. She however appears to have proficient blood flow into the extremities and I think would tolerate a 3 layer compression wrap she was noncompressible on arterial studies. Nonetheless there was no obvious signs of occlusion. 02/16/2020 on evaluation today patient appears to be doing much better in regard to her wounds in general. On the past week she has made significant improvements which is great news there is no signs of active infection at this time. No fevers, chills, nausea, vomiting, or diarrhea. 02/23/2020 on evaluation today patient appears to be doing well in regard to her lower extremity ulcers and ankle ulcers. Overall I feel like she is making great progress and in general I am extremely pleased with how things stand. There is no sign of active infection at this time which is also good news. She will require slight debridement around the right ankle region. 03/02/20-Patient returns at 1 week, has few new areas open on the left leg, especially 1 healed area on the medial ankle  that has slight small open area now, to anterior leg areas that started out as blisters that are open now. She was using juxta lite compression to the left, she used Band-Aid to cover the vesicle that had ruptured on the left anterior leg, we are using 3 layer compression on the right. We are using silver alginate to the toe wound on the left. 03/08/2020 upon evaluation today patient unfortunately is doing much worse in regard to her right leg although her left leg looks like is doing better. Again we have taken her compression wraps off last week since she was doing well and transitioned her to her Velcro compression wrap. However apparently the patient is stating this was not put on here in the clinic and she never put it on at home. With that being said unfortunately she developed swelling and opened up new wounds which are present today she has quite a few of them in fact. 03/15/2020 on evaluation today patient appears to be doing well with regard to her wounds on the lower extremities bilaterally. Fortunately there is no signs of active infection at this time. No fever chills noted. Overall I am actually very pleased with where things stand currently. 03/22/2020 on evaluation today patient appears to be doing fairly well in regard to her wounds in general. She just has really one area left on her toe everything else is healed on her legs. She does have her Velcro compression wraps which I think are appropriate at this point. Fortunately there is no signs of active infection at this time. No fevers, chills, nausea, vomiting, or diarrhea. 03/29/2020 on evaluation today patient appears to be doing poorly in regard to her right leg which is pretty much completely reopened compared to last week. She states it was okay until Saturday when she woke up she had blisters. With that being said it appears that this is likely stemming from the fact that the patient is apparently though I just found this out today  going to sleep in her bed with her legs up on the bed with her but throughout the night she tends to end up with her legs hanging off the side or bottom of the bed  on the floor. With that being said I think that this likely is happening much too long and then she has significant swelling the builds up as she sleeps. Obviously I think this is the reasoning behind what we are seeing currently. 04/05/2020 on evaluation today patient appears to be doing quite well with regard to her leg ulcers at this point. Fortunately there is no signs of anything significantly open again which is great news nonetheless I am to continue wrapping her for at least a couple of weeks due to the fact that she seems to continually reopen as soon as we unwrapped her. 04/12/2020 upon evaluation today patient appears to be doing excellent in regard to her leg ulcers all appear to be improving which is great news. She is also doing better in regard to the toe the infection seems to be under better control and the Santyl is helping to clean this up. Overall very pleased much more so than what I saw last week. 04/19/2020 on evaluation today patient appears to be doing well with regard to her leg ulcers which are showing no signs of opening at this point. Her toe is also showing signs of improvement she is about done with her antibiotic. Overall I feel like things are doing quite well. 04/26/2020 upon evaluation today patient appears to be doing unfortunately a little bit worse compared to previous. She has reopened wounds on the right anterior lower extremity. This is unfortunate as we were hoping that this would really maintain healing. That does not appear to be the case. I think with him the need to see about making a referral for venous studies and provider evaluation with [facility] imaging with the interventional radiologist. 08/11/2019 upon evaluation today patient appears to be doing well in general in regard to her legs. Her  right leg she does have a new wound that opened since last week. On the left leg everything appears to be healed she does still have the opening on her toe which we are managing but again this does not appear to be any worse. She just needs to get this to feeling and cover over. There is no evidence of infection which is at least great news. 05/17/2020 upon evaluation today patient appears to be doing decently well in regard to her leg ulcers this is good news. She does have still an opening on the left second toe. She has been let this dry out that not really what we want to see currently. For that reason I discussed with her again the fact that she needs to keep this covered and not allow it to dry out in order to allow for appropriate healing. She voiced understanding and states she did not know that. 05/24/2020 upon evaluation today patient's legs both appear to be still be completely healed which is great news. We are now going to transition into having her utilize her juxta lite compression wraps on the bilateral lower extremities. She is definitely ready for this. Her toe also seems to be doing better. 05/31/2020 on evaluation today patient appears to be doing well with regard to her legs. There is nothing open at this point. Her toe still has some slough noted and get a clean that off today by way of sharp debridement other than that I feel like the collagen is probably still her best bet in that regard. 06/07/2020 upon evaluation today patient appears to be doing well with regard to her left leg and her toe ulcer the  toe seems to be doing better the leg is doing okay. The right leg unfortunately she has new wounds I think her shoes are rubbing on the side of her ankle which is what is causing part of this issue. Fortunately there is no signs of active infection at this time which is great news. 06/14/2020 upon evaluation today patient appears to be doing well today in regard to most of her  wounds. Everything seems to be showing some signs of improvement or else healing. Fortunately there is no signs of active infection at this time. No fevers, chills, nausea, vomiting, or diarrhea. She has been using a silver collagen for her toe ulcer. Damage12/07/2019 on evaluation today patient appears to be doing okay in regard to her right medial ankle ulcer in her left toe ulcer. Both are a little bit better today visually but again she still is not doing as well as I like to see. She still continues to wear the worn-out 70 year old crocs which are not good for her to be honest I think it is causing and preventing healing. Fortunately there is no signs of active infection at this time. No fevers, chills, nausea, vomiting, or diarrhea. 06/28/20 upon evaluation today patient appears to be doing poorly in regard to her second toes bilaterally. These both appear to be rubbing on her shoes these are shoes that have asked him to get rid of every visit since have been seeing her. I had a much more lengthy conversation with her about it last week she told me she was going to get different shoes to wear I get she comes in with the same shoes on today. Nonetheless I think this is part of her problem. Obviously the left second toe appears to be infected at this point. The right necessarily see evidence of this but nonetheless is definitely been rubbing where it sticks up. 07/05/2020 on evaluation today patient's toe ulcers appear to be doing really about the same the left toe may be a little better in the right toe a little worse but overall not much change. Fortunately there is no signs of active infection at this time. No fevers, chills, nausea, vomiting, or diarrhea. 07/27/2020. Patient I do not usually see. She has wounds on her dorsal bilateral third toes. 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 She  comes in today using Santyl on the wounds. The left third toe is red and swollen right third toe with thick eschar over the wound surface. 1/13; patient has wounds on her bilateral third toes PIP. The area on the right has exposed bone. The area on the left this week does not. I put her on doxycycline empirically last week because of the appearance of the right third toe. The toe seems less swollen and red today but she did not get x-rays 1/20; wounds over the PIP is of both bilateral third toes dorsally. The area on the right has exposed bone in the area on the left has a necrotic surface. I gave her doxycycline a couple of weeks ago because of the swelling and redness of the third toe this is appeared better. She has finally gotten x-rays done but I have not had a chance to look at them yet. Although the patient has difficult pulses to feel she did have arterial studies done in May 2021. On the right she had noncompressible ABIs but  triphasic waveforms. On the left again noncompressible ABIs with triphasic waveforms. She does not have a great toe bilaterally however on the left and the right her waveforms in her remaining toes were felt to be normal. Once again she comes into this clinic in crocs with socks on her feet. We have been using silver alginate on the wounds awaiting the x-rays. 1/27; wounds over the PIPs of both third toes bilaterally. She again comes into the clinic in crocs I explained to her that this is likely the source of these wounds. Surprisingly the x-rays of both her feet were negative although the wound on the right has wide areas of exposed bone on the left a smaller area but still exposed bone. She tells me she is still taking doxycycline, by my numbers she should have been finished this a week ago Electronic Signature(s) Signed: 08/17/2020 4:56:03 PM By: [name] Ham MD Entered By: [name] on 08/17/2020  15:22:01 -------------------------------------------------------------------------------- Physical Exam Details Patient Name: Date of Service: [name], [place] [name] 08/17/2020 2:45 PM Medical Record Number: [id number] Patient Account Number: [id number] Date of Birth/Sex: Treating RN: 1951-05-30 (70 y.o. [name] Primary Care Provider: Jenna [name] Other Clinician: Referring Provider: Treating Provider/Extender: [name] in Treatment: 27 Constitutional Patient is hypotensive. But appears well. Pulse regular and within target range for patient.Marland Kitchen Respirations regular, non-labored and within target range.. Temperature is normal and within the target range for the patient.Marland Kitchen Appears in no distress. Notes Wound exam; right dorsal third toe is mostly exposed bone. I did a digital block and removed the specimen for culture and pathology. On the right third toe I used a #5 curette to knock the bone back I did not specimen this. The right third toe is swollen and erythematous again Electronic Signature(s) Signed: 08/17/2020 4:56:03 PM By: [name] Ham MD Entered By: [name] on 08/17/2020 15:21:01 -------------------------------------------------------------------------------- Physician Orders Details Patient Name: Date of Service: [facility] [name], [place] [name] 08/17/2020 2:45 PM Medical Record Number: [id number] Patient Account Number: [id number] Date of Birth/Sex: Treating RN: 08-Jan-1951 (70 y.o. [name], Meta.Reding Primary Care Provider: Jenna [name] Other Clinician: Referring Provider: Treating Provider/Extender: [name] in Treatment: 27 Verbal / Phone Orders: No Diagnosis Coding ICD-10 Coding Code Description I87.2 Venous insufficiency (chronic) (peripheral) E11.621 Type 2 diabetes mellitus with foot ulcer L97.522 Non-pressure chronic ulcer of other part of left foot with fat layer exposed L97.512 Non-pressure chronic  ulcer of other part of right foot with fat layer exposed N18.6 End stage renal disease Z99.2 Dependence on renal dialysis I10 Essential (primary) hypertension Follow-up Appointments Return Appointment in 1 week. Bathing/ Shower/ Hygiene May shower and wash wound with soap and water. Edema Control - Lymphedema / SCD / Other Bilateral Lower Extremities Elevate legs to the level of the heart or above for 30 minutes daily and/or when sitting, a frequency of: Avoid standing for long periods of time. Exercise regularly Moisturize legs daily. Compression stocking or Garment 20-30 mm/Hg pressure to: - juxtalites to both legs daily, apply in the morning and remove at night Additional Orders / Instructions Other: - ****Patient to please wear other shoes. Do not wear Crocs**** Wear your surgical shoes. Wound Treatment Wound #19 - T Third oe Wound Laterality: Right Cleanser: Soap and Water Every Other Day/30 Days Discharge Instructions: May shower and wash wound with dial antibacterial soap and water prior to dressing change. Prim Dressing: KerraCel Ag Gelling Fiber Dressing, 4x5 in (silver alginate) Every Other  Day/30 Days ary Discharge Instructions: Apply silver alginate to wound bed as instructed Secondary Dressing: Woven Gauze Sponges 2x2 in Every Other Day/30 Days Discharge Instructions: Apply over primary dressing as directed. Secured With: Child psychotherapist, Sterile 2x75 (in/in) Every Other Day/30 Days Discharge Instructions: Secure with stretch gauze as directed. Wound #8 - T Third oe Wound Laterality: Left Cleanser: Soap and Water Every Other Day/30 Days Discharge Instructions: May shower and wash wound with dial antibacterial soap and water prior to dressing change. Prim Dressing: KerraCel Ag Gelling Fiber Dressing, 4x5 in (silver alginate) Every Other Day/30 Days ary Discharge Instructions: Apply silver alginate to wound bed as instructed Secondary Dressing: Woven  Gauze Sponges 2x2 in Every Other Day/30 Days Discharge Instructions: Apply over primary dressing as directed. Secured With: Child psychotherapist, Sterile 2x75 (in/in) Every Other Day/30 Days Discharge Instructions: Secure with stretch gauze as directed. Laboratory naerobe culture (MICRO) - PCR bone culture of right 3rd toe related to non-healing diabetic Bacteria identified in Unspecified specimen by A wound. - (ICD10 E11.621 - Type 2 diabetes mellitus with foot ulcer) LOINC Code: 497-0 Convenience Name: Anerobic culture Bacteria identified in Tissue by Biopsy culture (MICRO) - Bone biopsy of right 3rd toe related to non-healing diabetic wound. - (ICD10 E11.621 - Type 2 diabetes mellitus with foot ulcer) LOINC Code: 26378-5 Convenience Name: Biopsy specimen culture Electronic Signature(s) Signed: 08/17/2020 5:42:01 PM By: [name] Signed: 08/18/2020 1:54:49 PM By: [name] Ham MD Previous Signature: 08/17/2020 4:56:03 PM Version By: [name] Ham MD Entered By: [name] on 08/17/2020 17:22:02 Prescription 08/17/2020 -------------------------------------------------------------------------------- Doroteo Bradford MD Patient Name: Provider: 11/07/1950 [id number] Date of Birth: NPI#Cinderella Plump [id number] Sex: DEA #: [phone] [id number] Phone #: License #: [employer] Patient Address: [place] [address] [place], [place] [address] [place], [place] [address] [phone] Allergies penicillin; adhesive tape Provider's Orders naerobe culture - ICD10: E11.621 - PCR bone culture of right 3rd toe related to non-healing Bacteria identified in Unspecified specimen by A diabetic wound. LOINC Code: 749-4 Convenience Name: Anerobic culture Hand Signature: Date(s): Prescription 08/17/2020 Doroteo Bradford MD Patient Name: Provider: 08-03-1950 [id number] Date of Birth: NPI#: F  [id number] Sex: DEA #: [phone] Phone #: License #: [place] Patient Address: [place] [address] [place], [place] [address] [place], [place] [address] [phone] Allergies penicillin; adhesive tape Provider's Orders Bacteria identified in Tissue by Biopsy culture - ICD10: E11.621 - Bone biopsy of right 3rd toe related to non-healing diabetic wound. LOINC Code: [id number] Convenience Name: Biopsy specimen culture Hand Signature: Date(s): Electronic Signature(s) Signed: 08/17/2020 5:42:01 PM By: [name] Signed: 08/18/2020 1:54:49 PM By: [name] Ham MD Previous Signature: 08/17/2020 4:56:03 PM Version By: [name] Ham MD Entered By: [name] on 08/17/2020 17:22:02 -------------------------------------------------------------------------------- Problem List Details Patient Name: Date of Service: [facility] [name], [place] [name] 08/17/2020 2:45 PM Medical Record Number: [id number] Patient Account Number: [id number] Date of Birth/Sex: Treating RN: 1951-06-17 (70 y.o. [name] Primary Care Provider: Jenna [name] Other Clinician: Referring Provider: Treating Provider/Extender: [name] in Treatment: 27 Active Problems ICD-10 Encounter Code Description Active Date MDM Diagnosis I87.2 Venous insufficiency (chronic) (peripheral) 02/09/2020 No Yes E11.621 Type 2 diabetes mellitus with foot ulcer 02/09/2020 No Yes L97.522 Non-pressure chronic ulcer of other part of left foot with fat layer exposed 05/31/2020 No Yes L97.512 Non-pressure  chronic ulcer of other part of right foot with fat layer exposed 07/05/2020 No Yes N18.6 End stage renal disease 02/09/2020 No Yes Z99.2 Dependence on renal dialysis 02/09/2020 No Yes I10 Essential (primary) hypertension 02/09/2020 No Yes Inactive Problems Resolved Problems ICD-10 Code Description Active Date Resolved Date L97.822 Non-pressure  chronic ulcer of other part of left lower leg with fat layer exposed 02/09/2020 02/09/2020 L97.312 Non-pressure chronic ulcer of right ankle with fat layer exposed 02/09/2020 02/09/2020 L97.812 Non-pressure chronic ulcer of other part of right lower leg with fat layer exposed 02/09/2020 02/09/2020 L97.512 Non-pressure chronic ulcer of other part of right foot with fat layer exposed 02/09/2020 02/09/2020 Electronic Signature(s) Signed: 08/17/2020 4:56:03 PM By: [name] Ham MD Entered By: [name] on 08/17/2020 15:17:43 -------------------------------------------------------------------------------- Progress Note Details Patient Name: Date of Service: [name], [place] [name] 08/17/2020 2:45 PM Medical Record Number: [id number] Patient Account Number: [id number] Date of Birth/Sex: Treating RN: 02/06/1951 (70 y.o. [name] Primary Care Provider: Jenna [name] Other Clinician: Referring Provider: Treating Provider/Extender: [name] in Treatment: 27 Subjective History of Present Illness (HPI) Evaluate7/21/2021 today patient presents for evaluation of ulcerations on her legs which she tells me tend to come and go as far as small blistering locations and sometimes are better than other times. Right now she tells me that this is actually doing a little bit better but nonetheless will not completely go away. She has ulcerations bilaterally on her lower extremities. The right is worse than the left. 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 The patient has no evidence of active infection at this time. She however appears to have proficient blood flow into the extremities and I think would tolerate a 3 layer compression wrap she was noncompressible on arterial studies. Nonetheless there was no obvious signs of occlusion. 02/16/2020 on evaluation today  patient appears to be doing much better in regard to her wounds in general. On the past week she has made significant improvements which is great news there is no signs of active infection at this time. No fevers, chills, nausea, vomiting, or diarrhea. 02/23/2020 on evaluation today patient appears to be doing well in regard to her lower extremity ulcers and ankle ulcers. Overall I feel like she is making great progress and in general I am extremely pleased with how things stand. There is no sign of active infection at this time which is also good news. She will require slight debridement around the right ankle region. 03/02/20-Patient returns at 1 week, has few new areas open on the left leg, especially 1 healed area on the medial ankle that has slight small open area now, to anterior leg areas that started out as blisters that are open now. She was using juxta lite compression to the left, she used Band-Aid to cover the vesicle that had ruptured on the left anterior leg, we are using 3 layer compression on the right. We are using silver alginate to the toe wound on the left. 03/08/2020 upon evaluation today patient unfortunately is doing much worse in regard to her right leg although her left leg looks like is doing better. Again we have taken her compression wraps off last week since she was doing well and transitioned her to her Velcro compression wrap. However apparently the patient is stating this was not put on here in the clinic and she never put it on at  home. With that being said unfortunately she developed swelling and opened up new wounds which are present today she has quite a few of them in fact. 03/15/2020 on evaluation today patient appears to be doing well with regard to her wounds on the lower extremities bilaterally. Fortunately there is no signs of active infection at this time. No fever chills noted. Overall I am actually very pleased with where things stand currently. 03/22/2020 on  evaluation today patient appears to be doing fairly well in regard to her wounds in general. She just has really one area left on her toe everything else is healed on her legs. She does have her Velcro compression wraps which I think are appropriate at this point. Fortunately there is no signs of active infection at this time. No fevers, chills, nausea, vomiting, or diarrhea. 03/29/2020 on evaluation today patient appears to be doing poorly in regard to her right leg which is pretty much completely reopened compared to last week. She states it was okay until Saturday when she woke up she had blisters. With that being said it appears that this is likely stemming from the fact that the patient is apparently though I just found this out today going to sleep in her bed with her legs up on the bed with her but throughout the night she tends to end up with her legs hanging off the side or bottom of the bed on the floor. With that being said I think that this likely is happening much too long and then she has significant swelling the builds up as she sleeps. Obviously I think this is the reasoning behind what we are seeing currently. 04/05/2020 on evaluation today patient appears to be doing quite well with regard to her leg ulcers at this point. Fortunately there is no signs of anything significantly open again which is great news nonetheless I am to continue wrapping her for at least a couple of weeks due to the fact that she seems to continually reopen as soon as we unwrapped her. 04/12/2020 upon evaluation today patient appears to be doing excellent in regard to her leg ulcers all appear to be improving which is great news. She is also doing better in regard to the toe the infection seems to be under better control and the Santyl is helping to clean this up. Overall very pleased much more so than what I saw last week. 04/19/2020 on evaluation today patient appears to be doing well with regard to her leg ulcers  which are showing no signs of opening at this point. Her toe is also showing signs of improvement she is about done with her antibiotic. Overall I feel like things are doing quite well. 04/26/2020 upon evaluation today patient appears to be doing unfortunately a little bit worse compared to previous. She has reopened wounds on the right anterior lower extremity. This is unfortunate as we were hoping that this would really maintain healing. That does not appear to be the case. I think with him the need to see about making a referral for venous studies and provider evaluation with [facility] imaging with the interventional radiologist. 08/11/2019 upon evaluation today patient appears to be doing well in general in regard to her legs. Her right leg she does have a new wound that opened since last week. On the left leg everything appears to be healed she does still have the opening on her toe which we are managing but again this does not appear to be any  worse. She just needs to get this to feeling and cover over. There is no evidence of infection which is at least great news. 05/17/2020 upon evaluation today patient appears to be doing decently well in regard to her leg ulcers this is good news. She does have still an opening on the left second toe. She has been let this dry out that not really what we want to see currently. For that reason I discussed with her again the fact that she needs to keep this covered and not allow it to dry out in order to allow for appropriate healing. She voiced understanding and states she did not know that. 05/24/2020 upon evaluation today patient's legs both appear to be still be completely healed which is great news. We are now going to transition into having her utilize her juxta lite compression wraps on the bilateral lower extremities. She is definitely ready for this. Her toe also seems to be doing better. 05/31/2020 on evaluation today patient appears to be doing well  with regard to her legs. There is nothing open at this point. Her toe still has some slough noted and get a clean that off today by way of sharp debridement other than that I feel like the collagen is probably still her best bet in that regard. 06/07/2020 upon evaluation today patient appears to be doing well with regard to her left leg and her toe ulcer the toe seems to be doing better the leg is doing okay. The right leg unfortunately she has new wounds I think her shoes are rubbing on the side of her ankle which is what is causing part of this issue. Fortunately there is no signs of active infection at this time which is great news. 06/14/2020 upon evaluation today patient appears to be doing well today in regard to most of her wounds. Everything seems to be showing some signs of improvement or else healing. Fortunately there is no signs of active infection at this time. No fevers, chills, nausea, vomiting, or diarrhea. She has been using a silver collagen for her toe ulcer. Damage12/07/2019 on evaluation today patient appears to be doing okay in regard to her right medial ankle ulcer in her left toe ulcer. Both are a little bit better today visually but again she still is not doing as well as I like to see. She still continues to wear the worn-out 70 year old crocs which are not good for her to be honest I think it is causing and preventing healing. Fortunately there is no signs of active infection at this time. No fevers, chills, nausea, vomiting, or diarrhea. 06/28/20 upon evaluation today patient appears to be doing poorly in regard to her second toes bilaterally. These both appear to be rubbing on her shoes these are shoes that have asked him to get rid of every visit since have been seeing her. I had a much more lengthy conversation with her about it last week she told me she was going to get different shoes to wear I get she comes in with the same shoes on today. Nonetheless I think this is  part of her problem. Obviously the left second toe appears to be infected at this point. The right necessarily see evidence of this but nonetheless is definitely been rubbing where it sticks up. 07/05/2020 on evaluation today patient's toe ulcers appear to be doing really about the same the left toe may be a little better in the right toe a little worse but overall not  much change. Fortunately there is no signs of active infection at this time. No fevers, chills, nausea, vomiting, or diarrhea. 07/27/2020. Patient I do not usually see. She has wounds on her dorsal bilateral third toes. 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 She comes in today using Santyl on the wounds. The left third toe is red and swollen right third toe with thick eschar over the wound surface. 1/13; patient has wounds on her bilateral third toes PIP. The area on the right has exposed bone. The area on the left this week does not. I put her on doxycycline empirically last week because of the appearance of the right third toe. The toe seems less swollen and red today but she did not get x-rays 1/20; wounds over the PIP is of both bilateral third toes dorsally. The area on the right has exposed bone in the area on the left has a necrotic surface. I gave her doxycycline a couple of weeks ago because of the swelling and redness of the third toe this is appeared better. She has finally gotten x-rays done but I have not had a chance to look at them yet. Although the patient has difficult pulses to feel she did have arterial studies done in May 2021. On the right she had noncompressible ABIs but triphasic waveforms. On the left again noncompressible ABIs with triphasic waveforms. She does not have a great toe bilaterally however on the left and the right her waveforms in her remaining toes were felt to be normal. Once again she comes  into this clinic in crocs with socks on her feet. We have been using silver alginate on the wounds awaiting the x-rays. 1/27; wounds over the PIPs of both third toes bilaterally. She again comes into the clinic in crocs I explained to her that this is likely the source of these wounds. Surprisingly the x-rays of both her feet were negative although the wound on the right has wide areas of exposed bone on the left a smaller area but still exposed bone. She tells me she is still taking doxycycline, by my numbers she should have been finished this a week ago Objective Constitutional Patient is hypotensive. But appears well. Pulse regular and within target range for patient.Marland Kitchen Respirations regular, non-labored and within target range.. Temperature is normal and within the target range for the patient.Marland Kitchen Appears in no distress. Vitals Time Taken: 2:46 PM, Height: 67 in, Weight: 202 lbs, BMI: 31.6, Temperature: 98.4 F, Pulse: 68 bpm, Respiratory Rate: 16 breaths/min, Blood Pressure: 98/38 mmHg. General Notes: Wound exam; right dorsal third toe is mostly exposed bone. I did a digital block and removed the specimen for culture and pathology. On the right third toe I used a #5 curette to knock the bone back I did not specimen this. The right third toe is swollen and erythematous again Integumentary (Hair, Skin) Wound #19 status is Open. Original cause of wound was Footwear Injury. The wound is located on the Right T Third. The wound measures 1.5cm length x oe 1.6cm width x 0.2cm depth; 1.885cm^2 area and 0.377cm^3 volume. There is bone and Fat Layer (Subcutaneous Tissue) exposed. There is no tunneling or undermining noted. There is a medium amount of serosanguineous drainage noted. The wound margin is distinct with the outline attached to the wound base. There is medium (34-66%) pink, pale granulation within the wound bed. There  is a medium (34-66%) amount of necrotic tissue within the wound bed  including Adherent Slough. Wound #8 status is Open. Original cause of wound was Blister. The wound is located on the Left T Third. The wound measures 0.6cm length x 0.5cm width x oe 0.1cm depth; 0.236cm^2 area and 0.024cm^3 volume. There is Fat Layer (Subcutaneous Tissue) exposed. There is no tunneling or undermining noted. There is a medium amount of serosanguineous drainage noted. The wound margin is flat and intact. There is small (1-33%) red granulation within the wound bed. There is a large (67-100%) amount of necrotic tissue within the wound bed including Adherent Slough. 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 There was a Excisional Skin/Subcutaneous Tissue/Muscle/Bone Debridement with a total area of 0.3 sq cm performed by [name]., MD. With the following instrument(s): Curette to remove Viable and Non-Viable tissue/material. Material removed includes Bone,Subcutaneous Tissue, Slough, Skin: Dermis, Skin: Epidermis, and Fibrin/Exudate after achieving pain control using Lidocaine 4% T opical Solution. A time out was conducted at 15:10, prior to the start of the procedure. A Minimum amount of bleeding was controlled with Pressure. The procedure was tolerated well with a pain level of 0 throughout and a pain level of 0 following the procedure. Post Debridement Measurements: 0.6cm length x 0.5cm width x 0.1cm depth; 0.024cm^3 volume. Character of  Wound/Ulcer Post Debridement is improved. Severity of Tissue Post Debridement is: Bone involvement without necrosis. Post procedure Diagnosis Wound #8: Same as Pre-Procedure Plan Follow-up Appointments: Return Appointment in 1 week. Bathing/ Shower/ Hygiene: May shower and wash wound with soap and water. Edema Control - Lymphedema / SCD / Other: Elevate legs to the level of the heart or above for 30 minutes daily and/or when sitting, a frequency of: Avoid standing for long periods of time. Exercise regularly Moisturize legs daily. Compression stocking or Garment 20-30 mm/Hg pressure to: - juxtalites to both legs daily, apply in the morning and remove at night Additional Orders / Instructions: Other: - ****Patient to please wear other shoes. Do not wear Crocs**** Wear your surgical shoes. Laboratory ordered were: Anerobic culture- PCR right 3rd toe - PCR bone culture of right 3rd toe related to non-healing diabetic wound., Bone Biopsy specimen culture- right 3rd toe - Bone biopsy of right 3rd toe related to non-healing diabetic wound. WOUND #19: - T Third Wound Laterality: Right oe Cleanser: Soap and Water Every Other Day/30 Days Discharge Instructions: May shower and wash wound with dial antibacterial soap and water prior to dressing change. Prim Dressing: KerraCel Ag Gelling Fiber Dressing, 4x5 in (silver alginate) Every Other Day/30 Days ary Discharge Instructions: Apply silver alginate to wound bed as instructed Secondary Dressing: Woven Gauze Sponges 2x2 in Every Other Day/30 Days Discharge Instructions: Apply over primary dressing as directed. Secured With: Child psychotherapist, Sterile 2x75 (in/in) Every Other Day/30 Days Discharge Instructions: Secure with stretch gauze as directed. WOUND #8: - T Third Wound Laterality: Left oe Cleanser: Soap and Water Every Other Day/30 Days Discharge Instructions: May shower and wash wound with dial  antibacterial soap and water  prior to dressing change. Prim Dressing: KerraCel Ag Gelling Fiber Dressing, 4x5 in (silver alginate) Every Other Day/30 Days ary Discharge Instructions: Apply silver alginate to wound bed as instructed Secondary Dressing: Woven Gauze Sponges 2x2 in Every Other Day/30 Days Discharge Instructions: Apply over primary dressing as directed. Secured With: Child psychotherapist, Sterile 2x75 (in/in) Every Other Day/30 Days Discharge Instructions: Secure with stretch gauze as directed. #1 bone specimen for pathology and CNS I have not changed her antibiotics 2. I am not sure how she managed to still be on doxycycline she cannot have taken it as directed. She says this has something to do with dialysis 3. I simply debrided the area on the left third toe taking the bone that was exposed down. Hopefully to get granulation over this 4. I asked her if she simply wanted to proceed with an amputation of the third toe. Dr. Amalia Hailey previously did this. I did not hear her give me a straight answer Electronic Signature(s) Signed: 08/17/2020 5:42:01 PM By: [name] Signed: 08/18/2020 1:54:49 PM By: [name] Ham MD Previous Signature: 08/17/2020 4:56:03 PM Version By: [name] Ham MD Entered By: [name] on 08/17/2020 17:22:27 -------------------------------------------------------------------------------- SuperBill Details Patient Name: Date of Service: [name], [place] [name] 08/17/2020 Medical Record Number: [id number] Patient Account Number: [id number] Date of Birth/Sex: Treating RN: 1951-02-04 (70 y.o. [name], Meta.Reding Primary Care Provider: Jenna [name] Other Clinician: Referring Provider: Treating Provider/Extender: [name] in Treatment: 27 Diagnosis Coding ICD-10 Codes Code Description I87.2 Venous insufficiency (chronic) (peripheral) E11.621 Type 2 diabetes mellitus with foot ulcer L97.522 Non-pressure chronic ulcer of other part of left foot with  fat layer exposed L97.512 Non-pressure chronic ulcer of other part of right foot with fat layer exposed N18.6 End stage renal disease Z99.2 Dependence on renal dialysis I10 Essential (primary) hypertension Facility Procedures CPT4 Code: [id number] Description: 56389 - DEB BONE 20 SQ CM/< ICD-10 Diagnosis Description L97.522 Non-pressure chronic ulcer of other part of left foot with fat layer expos Modifier: ed Quantity: 1 Physician Procedures CPT4: Description Modifier Code [id number] Debridement; bone (includes epidermis, dermis, subQ tissue, muscle and/or fascia, if performed) 1st 20 sqcm or less ICD-10 Diagnosis Description L97.522 Non-pressure chronic ulcer of other part of left foot with  fat layer exposed Quantity: 1 Electronic Signature(s) Signed: 08/17/2020 4:56:03 PM By: [name] Ham MD Entered By: [name] on 08/17/2020 15:24:06

## 2020-08-20 NOTE — Progress Notes (Signed)
Subjective:  70 y.o. female with PMHx of diabetes mellitus presenting today for evaluation of ulcers to the bilateral third toes.  Patient has history of prior toe amputations to the bilateral feet first and second digits.  Patient states that she has developed wounds overlying the third toes to the bilateral feet.  On the right foot there is actually exposed bone.  She is currently taking doxycycline and has been treated at the wound care center.  She presents today for further treatment and evaluation   Past Medical History:  Diagnosis Date  . Anemia   . Anemia associated with chronic renal failure   . Anemia in chronic kidney disease 09/29/2018  . Arthritis   . Asthma   . Blood transfusion without reported diagnosis    Phreesia 02/27/2020  . Cancer ([place])    Phreesia 02/27/2020  . Cataract    OD  . Chronic kidney disease    Phreesia 02/27/2020  . Coronary artery calcification seen on CAT scan 02/17/2018   Coronary calcification on CT  . Depression   . Diabetes mellitus without complication ([place])    Phreesia 02/27/2020  . Diabetic retinopathy of both eyes ([place])   . Diabetic ulcer of right foot associated with type 2 diabetes mellitus ([place])   . ESRD (end stage renal disease) on dialysis ([place])   . Essential hypertension 02/17/2018   Essential hypertension  . GERD (gastroesophageal reflux disease)    pt denies  . HLD (hyperlipidemia)   . Hypertension   . Hypertensive retinopathy    OU  . Left ventricular dysfunction 04/07/2018   Left ventricle dysfunction  . Microalbuminuria due to type 2 diabetes mellitus ([place])   . Neuromuscular disorder ([place])    diabetic neuropathy  . Nonproliferative retinopathy due to secondary diabetes ([place])   . Nonsustained ventricular tachycardia ([place]) 08/28/2018   Nonsustained ventricular tachycardia  . Pneumonia    hx of   . Renal cell cancer ([place])   . Right renal mass 03/10/2017  . Type 2 diabetes, controlled, with neuropathy ([place]) 06/22/2013  .  Ulcer of other part of foot 06/22/2013  . 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  Dermatology:  Wound #1 noted to the bilateral third toes measuring approximately 1.0 x 1.0 x 0.2 cm (LxWxD).   To the noted ulceration(s), there is no eschar. There is a moderate amount of slough, fibrin, and necrotic tissue noted. Granulation tissue and wound base is red. There is a minimal amount of serosanguineous drainage noted. There is no exposed bone muscle-tendon ligament or joint in regards to the left foot.  However the right foot does appear to have exposed bone and exposed head of the proximal phalanx sticking out of the ulcer. There is no malodor. Periwound integrity is intact. Skin is warm, dry and supple bilateral lower extremities.  Vascular: Palpable pedal pulses bilaterally. No edema or erythema noted. Capillary refill within normal limits.  Neurological: Epicritic and protective threshold diminished bilaterally.   Musculoskeletal Exam: History of prior bilateral toe amputations first and second digits bilateral  Assessment: 1.  Ulcer third digits bilateral secondary to diabetes mellitus 2. diabetes mellitus w/ peripheral neuropathy 3.  Osteomyelitis third digit right foot   Plan of Care:  1. Patient was evaluated. 2. Today we discussed the conservative versus surgical management of the presenting pathology. The patient opts for surgical  management. All possible complications and details of the procedure were explained. All patient questions were answered. No guarantees were expressed or implied. 3. Authorization for surgery was initiated today. Surgery will consist of right third digit amputation.  Arthroplasty of the third digit left foot.  I do believe we can perhaps salvage to the third toe of the left foot since there is currently no exposed bone.  By performing  arthroplasty this should allow for primary closure of the ulcer. 4.  Refill prescription for doxycycline 100 mg 2 times daily 5.  Return to clinic 1 week postop    Edrick Kins, DPM [facility]  Dr. Edrick Kins, DPM    [address], [place] [address]                Office [phone]  Fax [phone]

## 2020-08-21 DIAGNOSIS — Z992 Dependence on renal dialysis: Secondary | ICD-10-CM | POA: Diagnosis not present

## 2020-08-21 DIAGNOSIS — I129 Hypertensive chronic kidney disease with stage 1 through stage 4 chronic kidney disease, or unspecified chronic kidney disease: Secondary | ICD-10-CM | POA: Diagnosis not present

## 2020-08-21 DIAGNOSIS — N186 End stage renal disease: Secondary | ICD-10-CM | POA: Diagnosis not present

## 2020-08-22 ENCOUNTER — Other Ambulatory Visit: Payer: Self-pay | Admitting: Thoracic Surgery (Cardiothoracic Vascular Surgery)

## 2020-08-22 ENCOUNTER — Encounter: Payer: Self-pay | Admitting: Thoracic Surgery (Cardiothoracic Vascular Surgery)

## 2020-08-22 ENCOUNTER — Other Ambulatory Visit: Payer: Self-pay

## 2020-08-22 ENCOUNTER — Institutional Professional Consult (permissible substitution): Payer: Medicare Other | Admitting: Thoracic Surgery (Cardiothoracic Vascular Surgery)

## 2020-08-22 VITALS — BP 97/63 | HR 91 | Resp 20 | Ht 67.0 in | Wt 195.0 lb

## 2020-08-22 DIAGNOSIS — R590 Localized enlarged lymph nodes: Secondary | ICD-10-CM | POA: Diagnosis not present

## 2020-08-22 DIAGNOSIS — Z85528 Personal history of other malignant neoplasm of kidney: Secondary | ICD-10-CM | POA: Diagnosis not present

## 2020-08-22 NOTE — Progress Notes (Signed)
PCP is [name], MD Referring Provider is [name], MD  Chief Complaint  Patient presents with  . Consult    Surgical eval, Chest/ABD/Pelvis CT 06/28/20    HPI: [name] is sent for consultation regarding mediastinal adenopathy.  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   She has been followed with CT scans since her nephrectomy in 2018. About 6 months ago she had a CT of the chest abdomen and pelvis, which showed possible colon mass. She refused colonoscopy at that time and then had a follow-up CT. The colon issue remains. In addition there was a enlarged pretracheal mediastinal lymph node.  She is having loss of appetite and decreased energy. She is lost about 49 pounds since starting hemodialysis a year ago. She has not had any change in her weight over the past 3 months. She does have some pain in her feet and nonhealing ulcers on her feet. She has difficulty walking secondary to that. She also suffers from arthritis and diabetic neuropathy. She gets short of breath with exertion but is not having any chest pain, pressure, or tightness. She did smoke but quit 2 years ago. Past Medical History:  Diagnosis Date  . Anemia   . Anemia associated with chronic renal failure   . Anemia in chronic kidney disease 09/29/2018  . Arthritis   . Asthma   . Blood transfusion without reported diagnosis    Phreesia 02/27/2020  . Cancer ([place])    Phreesia 02/27/2020  . Cataract    OD  . Chronic kidney disease    Phreesia 02/27/2020  . Coronary artery calcification seen on CAT scan 02/17/2018   Coronary calcification on CT  . Depression   . Diabetes mellitus  without complication ([place])    Phreesia 02/27/2020  . Diabetic retinopathy of both eyes ([place])   . Diabetic ulcer of right foot associated with type 2 diabetes mellitus ([place])   . ESRD (end stage renal disease) on dialysis ([place])   . Essential hypertension 02/17/2018   Essential hypertension  . GERD (gastroesophageal reflux disease)    pt denies  . HLD (hyperlipidemia)   . Hypertension   . Hypertensive retinopathy    OU  . Left ventricular dysfunction 04/07/2018   Left ventricle dysfunction  . Microalbuminuria due to type 2 diabetes mellitus ([place])   . Neuromuscular disorder ([place])    diabetic neuropathy  . Nonproliferative retinopathy due to secondary diabetes ([place])   . Nonsustained ventricular tachycardia ([place]) 08/28/2018   Nonsustained ventricular tachycardia  . Pneumonia    hx of   . Renal cell cancer ([place])   . Right renal mass 03/10/2017  . Type 2 diabetes, controlled, with neuropathy ([place]) 06/22/2013  . Ulcer of other part of foot 06/22/2013  . 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 [facility])     Past Surgical History:  Procedure Laterality Date  . AMPUTATION TOE Right 10/29/2019   Procedure: RIGHT 2ND TOE AMPUTATION;  Surgeon: Edrick Kins, DPM;  Location: [facility];  Service: Podiatry;  Laterality: Right;  . BASCILIC VEIN TRANSPOSITION Left 08/08/2017   Procedure: BASILIC VEIN TRANSPOSITION FIRST STAGE LEFT ARM;  Surgeon: Serafina Mitchell, MD;  Location: [place];  Service: Vascular;  Laterality:  Left;  . BASCILIC VEIN TRANSPOSITION Left 05/14/2018   Procedure: SECOND STAGE BASILIC VEIN TRANSPOSITION LEFT ARM;  Surgeon: Serafina Mitchell, MD;  Location: [place];  Service: Vascular;  Laterality: Left;  . CATARACT EXTRACTION Left   . EYE SURGERY    . ROBOTIC ADRENALECTOMY Left 03/10/2017   Procedure: XI ROBOTIC ADRENALECTOMY;  Surgeon: Nickie Retort, MD;  Location: [facility];  Service: Urology;  Laterality: Left;  . ROBOTIC ASSITED PARTIAL NEPHRECTOMY Right 03/10/2017    Procedure: XI ROBOTIC ASSITED RADICAL NEPHRECTOMY;  Surgeon: Nickie Retort, MD;  Location: [facility];  Service: Urology;  Laterality: Right;  . TOE AMPUTATION Bilateral    both great toe ,, left foot 2nd toe 1/2    Family History  Problem Relation Age of Onset  . Heart disease Mother   . Diabetes Sister     Social History Social History   Tobacco Use  . Smoking status: Never Smoker  . Smokeless tobacco: Never Used  Vaping Use  . Vaping Use: Never used  Substance Use Topics  . Alcohol use: Yes    Alcohol/week: 0.0 standard drinks    Comment: occ  . Drug use: No    Current Outpatient Medications  Medication Sig Dispense Refill  . Blood Glucose Monitoring Suppl (ONE TOUCH ULTRA 2) w/Device KIT Use to check BS BID-QID Dx:E11.9 1 each 1  . BREO ELLIPTA 100-25 MCG/INH AEPB Inhale 1 puff by mouth once daily 180 each 0  . Calcium Carbonate Antacid 648 MG TABS Take by mouth.    . Carboxymethylcellulose Sodium (THERATEARS OP) Place 1 drop into both eyes daily as needed (dry eyes).    . diphenhydrAMINE (BENADRYL) 25 MG tablet Take 25 mg by mouth daily as needed for allergies.    Marland Kitchen doxycycline (MONODOX) 100 MG capsule Take 100 mg by mouth 2 (two) times daily.    Marland Kitchen doxycycline (VIBRA-TABS) 100 MG tablet Take 1 tablet (100 mg total) by mouth 2 (two) times daily. 20 tablet 0  . gabapentin (NEURONTIN) 300 MG capsule TAKE 1 CAPSULE BY MOUTH THREE TIMES DAILY. REQUIRES OFFICE VISIT BEFORE ANY FURTHER REFILLS CAN BE GIVEN 90 capsule 0  . insulin degludec (TRESIBA FLEXTOUCH) 100 UNIT/ML FlexTouch Pen Inject 0.3 mLs (30 Units total) into the skin daily. Hold if fasting blood sugars are <130. 45 mL 0  . Insulin Pen Needle (LIVE BETTER PEN NEEDLES) 31G X 6 MM MISC To use with Tresiba pens daily 100 each 3  . Lancets (ONETOUCH ULTRASOFT) lancets Use as instructed 300 each 3  . LOPERAMIDE HCL PO Take by mouth.    . multivitamin (RENA-VIT) TABS tablet Take 1 tablet by mouth daily.    Glory Rosebush  ULTRA test strip USE AS DIRECTED TO MONITOR  FSBS 3 TIMES DAILY 300 strip 3  . PROAIR HFA 108 (90 Base) MCG/ACT inhaler INHALE 2 PUFFS BY MOUTH EVERY 6 HOURS AS NEEDED FOR WHEEZING FOR SHORTNESS OF BREATH 9 g 0  . rosuvastatin (CRESTOR) 20 MG tablet Take 1 tablet (20 mg total) by mouth daily. 90 tablet 3  . SANTYL ointment Apply 1 application topically daily.     Current Facility-Administered Medications  Medication Dose Route Frequency Provider Last Rate Last Admin  . Bevacizumab (AVASTIN) SOLN 1.25 mg  1.25 mg Intravitreal  [name], MD   1.25 mg at 05/12/18 1319  . Bevacizumab (AVASTIN) SOLN 1.25 mg  1.25 mg Intravitreal  [name], MD   1.25 mg at 06/13/18 0055  .  Bevacizumab (AVASTIN) SOLN 1.25 mg  1.25 mg Intravitreal  [name], MD   1.25 mg at 07/10/18 1308  . Bevacizumab (AVASTIN) SOLN 1.25 mg  1.25 mg Intravitreal  [name], MD   1.25 mg at 08/21/18 1312  . Bevacizumab (AVASTIN) SOLN 1.25 mg  1.25 mg Intravitreal  [name], MD   1.25 mg at 10/02/18 1510    Allergies  Allergen Reactions  . Penicillins Other (See Comments)    UNSPECIFIED CHILDHOOD REACTION  Has patient had a PCN reaction causing immediate rash, facial/tongue/throat swelling, SOB or lightheadedness with hypotension: Unknown Has patient had a PCN reaction causing severe rash involving mucus membranes or skin necrosis: Unknown Has patient had a PCN reaction that required hospitalization: Unknown Has patient had a PCN reaction occurring within the last 10 years: Unknown If all of the above answers are "NO", then may proceed with Cephalosporin use.   . Adhesive [Tape] Rash    Review of Systems  Constitutional: Positive for activity change, appetite change, fatigue and unexpected weight change.  HENT: Negative for trouble swallowing and voice change.   Respiratory: Positive for shortness of breath. Negative for cough and wheezing.   Cardiovascular: Positive for leg swelling. Negative for chest  pain.  Gastrointestinal: Positive for abdominal pain (Reflux).  Genitourinary: Positive for frequency. Negative for dysuria.  Musculoskeletal: Positive for arthralgias and gait problem.  Skin: Positive for wound (Foot ulcers).  Neurological: Positive for numbness.  All other systems reviewed and are negative.   BP 97/63   Pulse 91   Resp 20   Ht _0  (1.702 m)   Wt 195 lb (88.5 kg)   SpO2 95% Comment: RA  BMI 30.54 kg/m  Physical Exam Vitals reviewed.  Constitutional:      General: She is not in acute distress.    Appearance: She is obese.  HENT:     Head: Normocephalic and atraumatic.  Eyes:     General: No scleral icterus.    Extraocular Movements: Extraocular movements intact.  Neck:     Vascular: No carotid bruit.  Cardiovascular:     Rate and Rhythm: Normal rate and regular rhythm.     Heart sounds: Normal heart sounds. No murmur heard.   Pulmonary:     Effort: Pulmonary effort is normal. No respiratory distress.     Breath sounds: Normal breath sounds. No wheezing or rales.  Abdominal:     General: There is no distension.     Palpations: Abdomen is soft.  Lymphadenopathy:     Cervical: No cervical adenopathy.  Skin:    General: Skin is warm and dry.  Neurological:     General: No focal deficit present.     Mental Status: She is alert and oriented to person, place, and time.     Cranial Nerves: No cranial nerve deficit.     Motor: No weakness.    Diagnostic Tests: CT CHEST, ABDOMEN AND PELVIS WITHOUT CONTRAST  TECHNIQUE: Multidetector CT imaging of the chest, abdomen and pelvis was performed following the standard protocol without IV contrast.  COMPARISON: CT abdomen pelvis, 05/19/2020, CT chest abdomen pelvis, 12/01/2019  FINDINGS: CT CHEST FINDINGS  Cardiovascular: Aortic atherosclerosis. Aortic valve calcifications. Normal heart size. Extensive 3 vessel coronary artery calcifications and stents. Enlargement of the main pulmonary artery  measuring up to 3.6 cm in caliber. No pericardial effusion.  Mediastinum/Nodes: Newly enlarged pretracheal lymph node measuring 2.3 x 2.0 cm, previously 1.4 x 0.8 cm (series 2, image 21). Thyroid gland,  trachea, and esophagus demonstrate no significant findings.  Lungs/Pleura: Stable, benign 3 mm nodule of the peripheral left upper lobe (series 3, image 52). No pleural effusion or pneumothorax.  Musculoskeletal: No chest wall mass or suspicious bone lesions identified.  CT ABDOMEN PELVIS FINDINGS  Hepatobiliary: No solid liver abnormality is seen. No gallstones, gallbladder wall thickening, or biliary dilatation.  Pancreas: Unremarkable. No pancreatic ductal dilatation or surrounding inflammatory changes.  Spleen: Normal in size without significant abnormality.  Adrenals/Urinary Tract: Redemonstrated postoperative findings of left adrenalectomy. The right adrenal gland is normal. Redemonstrated postoperative findings of right nephrectomy. The left kidney is normal. No evidence of locally recurrent soft tissue. Thickened, although decompressed urinary bladder.  Stomach/Bowel: Stomach is within normal limits. Incidental diverticulum of the descending duodenum. Appendix appears normal. There is redemonstrated circumferential wall thickening of the mid ascending colon, involving a segment of approximately 5 cm in length (series 2, image 74, series 400, image 100).  Vascular/Lymphatic: Aortic atherosclerosis. No enlarged abdominal or pelvic lymph nodes.  Reproductive: Redemonstrated fluid attenuation cystic lesion of the left ovary measuring 3.3 cm, not significantly changed.  Other: No abdominal wall hernia or abnormality. No abdominopelvic ascites.  Musculoskeletal: No acute or significant osseous findings.  IMPRESSION: 1. Redemonstrated postoperative findings of right nephrectomy and left adrenalectomy. No evidence of locally recurrent soft tissue or lymphadenopathy in  the abdomen or pelvis.. 2. There is redemonstrated circumferential wall thickening of the mid ascending colon, involving a segment of approximately 5 cm in length. Persistence of this finding in comparison to CT dated 05/19/2020 is highly concerning for primary colon malignancy. Recommend colonoscopy if not performed. 3. Newly enlarged pretracheal lymph node measuring 2.3 x 2.0 cm, previously 1.4 x 0.8 cm, nonspecific. Metastatic disease is possible but this would not be a common isolated manifestation of renal cell carcinoma or colon metastases. Consider metabolic characterization by PET-CT. 4. Enlargement of the main pulmonary artery measuring up to 3.6 cm in caliber, which can be seen in pulmonary hypertension. 5. Aortic atherosclerosis and aortic valve calcifications. Correlate for echocardiographic evidence of aortic valve dysfunction. 6. Coronary artery disease.  Aortic Atherosclerosis (ICD10-I70.0).   Electronically Signed By: Eddie Candle M.D. On: 06/29/2020 11:25 I personally reviewed the CT images. There is an enlarged pretracheal node that is otherwise unremarkable. Is very uniform and smoothly marginated. There actually was an outline previously in that area which raises the possibility that represents cyst of some type.  Impression: [name] is a 70 year old woman with a complicated past medical history. 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   She had a CT about 6 months ago which showed a question of a constricting colon lesion. She refused colonoscopy at that time. A follow-up CT recently showed that that is still present. In my mind that is the most pressing issue and would take precedence over the  mediastinal node.  Her most recent CT showed well-circumscribed 2 to 2.3 cm pretracheal node. There is an outline in that area previously that could have been some fat within the node but also raised the possibility that this is some type of simple cyst. It could be malignant, but is highly unlikely to be related to the colon or her prior renal issues. There is no primary lung mass of suspicion.  I recommended to  her that we do a PET/CT to further evaluate that. If there is activity we could do an EBUS plus minus mediastinoscopy to biopsy the node. Again I would not delay her colon work-up for the mediastinal node.  We did briefly discuss the proposed biopsy procedure should it be necessary. We would start with an endobronchial ultrasound. If that was definitive then there would be no need for any additional procedure. If that was not definitive then we would proceed with a mediastinoscopy. We discussed the general nature of the procedure as well as some of the potential complications.  If the PET shows hypermetabolic activity we can discuss the procedure in more detail.  Plan: PET/CT Return in 2 weeks to discuss results and decide whether or not further investigation is warranted.  Melrose Nakayama, MD [facility] [phone]

## 2020-08-23 ENCOUNTER — Telehealth: Payer: Self-pay | Admitting: Podiatry

## 2020-08-23 NOTE — Telephone Encounter (Signed)
DOS: 08/31/2020  Procedures: Hammertoe Repair 3rd Lt ([id number]) & Amputation Toe MPJ Joint 3rd Rt ([id number])  [employer] Effective From 07/22/2020 - Present  Deductible: $0 Out of Pocket: $4,500 with $120 met and $4,380 remaining. CoInsurance: 100% Copay: $225 Tier 1 l $325 Tier 2  Notification or Prior Authorization is not required for the requested services  This [employer] Advantage members plan does not currently require a prior authorization for these services. If you have general questions about the prior authorization requirements, please call us at [phone] or visit [email or web address] > Clinician Resources > Advance and Admission Notification Requirements. The number above acknowledges your notification. Please write this number down for future reference. Notification is not a guarantee of coverage or payment.  Decision ID #:[id number]  The number above acknowledges your inquiry and our response. Please write this number down and refer to it for future inquiries. Coverage and payment for an item or service is governed by the member's benefit plan document, and, if applicable, the provider's participation agreement with the Health Plan.  

## 2020-08-24 ENCOUNTER — Encounter: Payer: Self-pay | Admitting: Urology

## 2020-08-24 ENCOUNTER — Encounter (HOSPITAL_BASED_OUTPATIENT_CLINIC_OR_DEPARTMENT_OTHER): Payer: Medicare Other | Admitting: Internal Medicine

## 2020-08-24 DIAGNOSIS — N186 End stage renal disease: Secondary | ICD-10-CM | POA: Diagnosis not present

## 2020-08-24 DIAGNOSIS — Z992 Dependence on renal dialysis: Secondary | ICD-10-CM | POA: Diagnosis not present

## 2020-08-24 DIAGNOSIS — D631 Anemia in chronic kidney disease: Secondary | ICD-10-CM | POA: Diagnosis not present

## 2020-08-24 DIAGNOSIS — N2581 Secondary hyperparathyroidism of renal origin: Secondary | ICD-10-CM | POA: Diagnosis not present

## 2020-08-24 DIAGNOSIS — D689 Coagulation defect, unspecified: Secondary | ICD-10-CM | POA: Diagnosis not present

## 2020-08-24 DIAGNOSIS — E1129 Type 2 diabetes mellitus with other diabetic kidney complication: Secondary | ICD-10-CM | POA: Diagnosis not present

## 2020-08-27 ENCOUNTER — Inpatient Hospital Stay (HOSPITAL_COMMUNITY)
Admission: EM | Admit: 2020-08-27 | Discharge: 2020-09-07 | DRG: 329 | Disposition: A | Discharge status: Skilled Nursing Facility | Payer: Medicare Other | Attending: Family Medicine | Admitting: Family Medicine

## 2020-08-27 ENCOUNTER — Encounter (HOSPITAL_COMMUNITY): Payer: Self-pay | Admitting: Obstetrics and Gynecology

## 2020-08-27 ENCOUNTER — Emergency Department (HOSPITAL_COMMUNITY): Payer: Medicare Other

## 2020-08-27 ENCOUNTER — Other Ambulatory Visit: Payer: Self-pay

## 2020-08-27 DIAGNOSIS — Z905 Acquired absence of kidney: Secondary | ICD-10-CM

## 2020-08-27 DIAGNOSIS — Z85528 Personal history of other malignant neoplasm of kidney: Secondary | ICD-10-CM

## 2020-08-27 DIAGNOSIS — E46 Unspecified protein-calorie malnutrition: Secondary | ICD-10-CM | POA: Diagnosis not present

## 2020-08-27 DIAGNOSIS — K219 Gastro-esophageal reflux disease without esophagitis: Secondary | ICD-10-CM | POA: Diagnosis present

## 2020-08-27 DIAGNOSIS — Z743 Need for continuous supervision: Secondary | ICD-10-CM | POA: Diagnosis not present

## 2020-08-27 DIAGNOSIS — I120 Hypertensive chronic kidney disease with stage 5 chronic kidney disease or end stage renal disease: Secondary | ICD-10-CM | POA: Diagnosis present

## 2020-08-27 DIAGNOSIS — C19 Malignant neoplasm of rectosigmoid junction: Secondary | ICD-10-CM | POA: Diagnosis not present

## 2020-08-27 DIAGNOSIS — K56609 Unspecified intestinal obstruction, unspecified as to partial versus complete obstruction: Secondary | ICD-10-CM | POA: Diagnosis present

## 2020-08-27 DIAGNOSIS — E1169 Type 2 diabetes mellitus with other specified complication: Secondary | ICD-10-CM | POA: Diagnosis not present

## 2020-08-27 DIAGNOSIS — Z7401 Bed confinement status: Secondary | ICD-10-CM | POA: Diagnosis not present

## 2020-08-27 DIAGNOSIS — M86672 Other chronic osteomyelitis, left ankle and foot: Secondary | ICD-10-CM | POA: Diagnosis not present

## 2020-08-27 DIAGNOSIS — E1122 Type 2 diabetes mellitus with diabetic chronic kidney disease: Secondary | ICD-10-CM | POA: Diagnosis present

## 2020-08-27 DIAGNOSIS — M6281 Muscle weakness (generalized): Secondary | ICD-10-CM | POA: Diagnosis not present

## 2020-08-27 DIAGNOSIS — Z85038 Personal history of other malignant neoplasm of large intestine: Secondary | ICD-10-CM | POA: Diagnosis not present

## 2020-08-27 DIAGNOSIS — C189 Malignant neoplasm of colon, unspecified: Secondary | ICD-10-CM | POA: Diagnosis not present

## 2020-08-27 DIAGNOSIS — D631 Anemia in chronic kidney disease: Secondary | ICD-10-CM | POA: Diagnosis not present

## 2020-08-27 DIAGNOSIS — Z66 Do not resuscitate: Secondary | ICD-10-CM | POA: Diagnosis not present

## 2020-08-27 DIAGNOSIS — K6389 Other specified diseases of intestine: Secondary | ICD-10-CM

## 2020-08-27 DIAGNOSIS — E11649 Type 2 diabetes mellitus with hypoglycemia without coma: Secondary | ICD-10-CM | POA: Diagnosis not present

## 2020-08-27 DIAGNOSIS — E11319 Type 2 diabetes mellitus with unspecified diabetic retinopathy without macular edema: Secondary | ICD-10-CM | POA: Diagnosis not present

## 2020-08-27 DIAGNOSIS — L97519 Non-pressure chronic ulcer of other part of right foot with unspecified severity: Secondary | ICD-10-CM | POA: Diagnosis not present

## 2020-08-27 DIAGNOSIS — Z992 Dependence on renal dialysis: Secondary | ICD-10-CM | POA: Diagnosis not present

## 2020-08-27 DIAGNOSIS — L03031 Cellulitis of right toe: Secondary | ICD-10-CM | POA: Diagnosis present

## 2020-08-27 DIAGNOSIS — Z794 Long term (current) use of insulin: Secondary | ICD-10-CM

## 2020-08-27 DIAGNOSIS — N189 Chronic kidney disease, unspecified: Secondary | ICD-10-CM | POA: Diagnosis not present

## 2020-08-27 DIAGNOSIS — E1165 Type 2 diabetes mellitus with hyperglycemia: Secondary | ICD-10-CM | POA: Diagnosis present

## 2020-08-27 DIAGNOSIS — E113299 Type 2 diabetes mellitus with mild nonproliferative diabetic retinopathy without macular edema, unspecified eye: Secondary | ICD-10-CM | POA: Diagnosis present

## 2020-08-27 DIAGNOSIS — L03119 Cellulitis of unspecified part of limb: Secondary | ICD-10-CM | POA: Diagnosis present

## 2020-08-27 DIAGNOSIS — C641 Malignant neoplasm of right kidney, except renal pelvis: Secondary | ICD-10-CM | POA: Diagnosis not present

## 2020-08-27 DIAGNOSIS — D125 Benign neoplasm of sigmoid colon: Secondary | ICD-10-CM | POA: Diagnosis present

## 2020-08-27 DIAGNOSIS — M869 Osteomyelitis, unspecified: Secondary | ICD-10-CM | POA: Diagnosis not present

## 2020-08-27 DIAGNOSIS — R1031 Right lower quadrant pain: Secondary | ICD-10-CM | POA: Diagnosis present

## 2020-08-27 DIAGNOSIS — R6889 Other general symptoms and signs: Secondary | ICD-10-CM | POA: Diagnosis not present

## 2020-08-27 DIAGNOSIS — Z79899 Other long term (current) drug therapy: Secondary | ICD-10-CM

## 2020-08-27 DIAGNOSIS — Z833 Family history of diabetes mellitus: Secondary | ICD-10-CM

## 2020-08-27 DIAGNOSIS — E785 Hyperlipidemia, unspecified: Secondary | ICD-10-CM | POA: Diagnosis present

## 2020-08-27 DIAGNOSIS — D124 Benign neoplasm of descending colon: Secondary | ICD-10-CM | POA: Diagnosis not present

## 2020-08-27 DIAGNOSIS — K635 Polyp of colon: Secondary | ICD-10-CM | POA: Diagnosis not present

## 2020-08-27 DIAGNOSIS — C772 Secondary and unspecified malignant neoplasm of intra-abdominal lymph nodes: Secondary | ICD-10-CM | POA: Diagnosis present

## 2020-08-27 DIAGNOSIS — N250 Renal osteodystrophy: Secondary | ICD-10-CM | POA: Diagnosis present

## 2020-08-27 DIAGNOSIS — C182 Malignant neoplasm of ascending colon: Secondary | ICD-10-CM | POA: Diagnosis not present

## 2020-08-27 DIAGNOSIS — Z20822 Contact with and (suspected) exposure to covid-19: Secondary | ICD-10-CM | POA: Diagnosis not present

## 2020-08-27 DIAGNOSIS — J4520 Mild intermittent asthma, uncomplicated: Secondary | ICD-10-CM | POA: Diagnosis not present

## 2020-08-27 DIAGNOSIS — I2510 Atherosclerotic heart disease of native coronary artery without angina pectoris: Secondary | ICD-10-CM | POA: Diagnosis present

## 2020-08-27 DIAGNOSIS — N261 Atrophy of kidney (terminal): Secondary | ICD-10-CM | POA: Diagnosis not present

## 2020-08-27 DIAGNOSIS — K56699 Other intestinal obstruction unspecified as to partial versus complete obstruction: Secondary | ICD-10-CM | POA: Diagnosis not present

## 2020-08-27 DIAGNOSIS — D490 Neoplasm of unspecified behavior of digestive system: Secondary | ICD-10-CM | POA: Diagnosis not present

## 2020-08-27 DIAGNOSIS — Z741 Need for assistance with personal care: Secondary | ICD-10-CM | POA: Diagnosis not present

## 2020-08-27 DIAGNOSIS — K56600 Partial intestinal obstruction, unspecified as to cause: Secondary | ICD-10-CM

## 2020-08-27 DIAGNOSIS — J45909 Unspecified asthma, uncomplicated: Secondary | ICD-10-CM | POA: Diagnosis present

## 2020-08-27 DIAGNOSIS — E1151 Type 2 diabetes mellitus with diabetic peripheral angiopathy without gangrene: Secondary | ICD-10-CM | POA: Diagnosis present

## 2020-08-27 DIAGNOSIS — E133299 Other specified diabetes mellitus with mild nonproliferative diabetic retinopathy without macular edema, unspecified eye: Secondary | ICD-10-CM | POA: Diagnosis present

## 2020-08-27 DIAGNOSIS — D6959 Other secondary thrombocytopenia: Secondary | ICD-10-CM | POA: Diagnosis present

## 2020-08-27 DIAGNOSIS — E11628 Type 2 diabetes mellitus with other skin complications: Secondary | ICD-10-CM | POA: Diagnosis present

## 2020-08-27 DIAGNOSIS — G629 Polyneuropathy, unspecified: Secondary | ICD-10-CM

## 2020-08-27 DIAGNOSIS — M86671 Other chronic osteomyelitis, right ankle and foot: Secondary | ICD-10-CM | POA: Diagnosis not present

## 2020-08-27 DIAGNOSIS — E1142 Type 2 diabetes mellitus with diabetic polyneuropathy: Secondary | ICD-10-CM

## 2020-08-27 DIAGNOSIS — L408 Other psoriasis: Secondary | ICD-10-CM | POA: Diagnosis present

## 2020-08-27 DIAGNOSIS — K5669 Other partial intestinal obstruction: Secondary | ICD-10-CM | POA: Diagnosis not present

## 2020-08-27 DIAGNOSIS — Z7951 Long term (current) use of inhaled steroids: Secondary | ICD-10-CM

## 2020-08-27 DIAGNOSIS — Z515 Encounter for palliative care: Secondary | ICD-10-CM | POA: Diagnosis not present

## 2020-08-27 DIAGNOSIS — N186 End stage renal disease: Secondary | ICD-10-CM | POA: Diagnosis not present

## 2020-08-27 DIAGNOSIS — Z89411 Acquired absence of right great toe: Secondary | ICD-10-CM

## 2020-08-27 DIAGNOSIS — R933 Abnormal findings on diagnostic imaging of other parts of digestive tract: Secondary | ICD-10-CM | POA: Diagnosis not present

## 2020-08-27 DIAGNOSIS — E7800 Pure hypercholesterolemia, unspecified: Secondary | ICD-10-CM | POA: Diagnosis not present

## 2020-08-27 DIAGNOSIS — Z7189 Other specified counseling: Secondary | ICD-10-CM | POA: Diagnosis not present

## 2020-08-27 DIAGNOSIS — Z9115 Patient's noncompliance with renal dialysis: Secondary | ICD-10-CM

## 2020-08-27 DIAGNOSIS — D126 Benign neoplasm of colon, unspecified: Secondary | ICD-10-CM

## 2020-08-27 DIAGNOSIS — E11621 Type 2 diabetes mellitus with foot ulcer: Secondary | ICD-10-CM | POA: Diagnosis present

## 2020-08-27 DIAGNOSIS — E875 Hyperkalemia: Secondary | ICD-10-CM | POA: Diagnosis present

## 2020-08-27 DIAGNOSIS — M898X9 Other specified disorders of bone, unspecified site: Secondary | ICD-10-CM | POA: Diagnosis present

## 2020-08-27 DIAGNOSIS — R1310 Dysphagia, unspecified: Secondary | ICD-10-CM | POA: Diagnosis present

## 2020-08-27 DIAGNOSIS — M2550 Pain in unspecified joint: Secondary | ICD-10-CM | POA: Diagnosis not present

## 2020-08-27 DIAGNOSIS — D509 Iron deficiency anemia, unspecified: Secondary | ICD-10-CM | POA: Diagnosis not present

## 2020-08-27 DIAGNOSIS — Z4682 Encounter for fitting and adjustment of non-vascular catheter: Secondary | ICD-10-CM | POA: Diagnosis not present

## 2020-08-27 DIAGNOSIS — Z6830 Body mass index (BMI) 30.0-30.9, adult: Secondary | ICD-10-CM

## 2020-08-27 DIAGNOSIS — E1140 Type 2 diabetes mellitus with diabetic neuropathy, unspecified: Secondary | ICD-10-CM | POA: Diagnosis not present

## 2020-08-27 DIAGNOSIS — N184 Chronic kidney disease, stage 4 (severe): Secondary | ICD-10-CM | POA: Diagnosis not present

## 2020-08-27 DIAGNOSIS — K56691 Other complete intestinal obstruction: Secondary | ICD-10-CM | POA: Diagnosis not present

## 2020-08-27 DIAGNOSIS — J90 Pleural effusion, not elsewhere classified: Secondary | ICD-10-CM | POA: Diagnosis not present

## 2020-08-27 HISTORY — DX: Disorder of kidney and ureter, unspecified: N28.9

## 2020-08-27 LAB — COMPREHENSIVE METABOLIC PANEL
ALT: 12 U/L (ref 0–44)
AST: 20 U/L (ref 15–41)
Albumin: 3.5 g/dL (ref 3.5–5.0)
Alkaline Phosphatase: 72 U/L (ref 38–126)
Anion gap: 22 — ABNORMAL HIGH (ref 5–15)
BUN: 71 mg/dL — ABNORMAL HIGH (ref 8–23)
CO2: 22 mmol/L (ref 22–32)
Calcium: 8.2 mg/dL — ABNORMAL LOW (ref 8.9–10.3)
Chloride: 93 mmol/L — ABNORMAL LOW (ref 98–111)
Creatinine, Ser: 8.79 mg/dL — ABNORMAL HIGH (ref 0.44–1.00)
GFR, Estimated: 4 mL/min — ABNORMAL LOW (ref >60)
Glucose, Bld: 260 mg/dL — ABNORMAL HIGH (ref 70–99)
Potassium: 4.9 mmol/L (ref 3.5–5.1)
Sodium: 137 mmol/L (ref 135–145)
Total Bilirubin: 1.1 mg/dL (ref 0.3–1.2)
Total Protein: 7.4 g/dL (ref 6.5–8.1)

## 2020-08-27 LAB — CBC
HCT: 38.4 % (ref 36.0–46.0)
Hemoglobin: 12.8 g/dL (ref 12.0–15.0)
MCH: 31.4 pg (ref 26.0–34.0)
MCHC: 33.3 g/dL (ref 30.0–36.0)
MCV: 94.1 fL (ref 80.0–100.0)
Platelets: 204 10*3/uL (ref 150–400)
RBC: 4.08 MIL/uL (ref 3.87–5.11)
RDW: 15.2 % (ref 11.5–15.5)
WBC: 10.7 10*3/uL — ABNORMAL HIGH (ref 4.0–10.5)
nRBC: 0 % (ref 0.0–0.2)

## 2020-08-27 LAB — LIPASE, BLOOD: Lipase: 33 U/L (ref 11–51)

## 2020-08-27 LAB — SARS CORONAVIRUS 2 BY RT PCR (HOSPITAL ORDER, PERFORMED IN ~~LOC~~ HOSPITAL LAB): SARS Coronavirus 2: NEGATIVE

## 2020-08-27 MED ORDER — SODIUM CHLORIDE 0.9 % IV SOLN: 2.0000 g | Freq: Once | INTRAVENOUS | Status: DC

## 2020-08-27 MED ORDER — ALTEPLASE 2 MG IJ SOLR: 2.0000 mg | Freq: Once | INTRAMUSCULAR | Status: DC | PRN

## 2020-08-27 MED ORDER — VANCOMYCIN HCL 2000 MG/400ML IV SOLN
2000.0000 mg | Freq: Once | INTRAVENOUS | Status: AC
  Administered 2020-08-27: 2000 mg via INTRAVENOUS
  Filled 2020-08-27: qty 400

## 2020-08-27 MED ORDER — ONDANSETRON HCL 4 MG/2ML IJ SOLN
4.0000 mg | Freq: Once | INTRAMUSCULAR | Status: AC
  Administered 2020-08-27: 4 mg via INTRAVENOUS
  Filled 2020-08-27: qty 2

## 2020-08-27 MED ORDER — LIDOCAINE-PRILOCAINE 2.5-2.5 % EX CREA: 1.0000 "application " | TOPICAL_CREAM | CUTANEOUS | Status: DC | PRN

## 2020-08-27 MED ORDER — COLLAGENASE 250 UNIT/GM EX OINT
1.0000 "application " | TOPICAL_OINTMENT | Freq: Every day | CUTANEOUS | Status: DC
  Filled 2020-08-27: qty 30

## 2020-08-27 MED ORDER — GABAPENTIN 300 MG PO CAPS
300.0000 mg | ORAL_CAPSULE | Freq: Three times a day (TID) | ORAL | Status: DC
Start: 2020-08-27 — End: 2020-09-01
  Administered 2020-08-27 – 2020-08-31 (×6): 300 mg via ORAL
  Filled 2020-08-27 (×9): qty 1

## 2020-08-27 MED ORDER — HEPARIN SODIUM (PORCINE) 5000 UNIT/ML IJ SOLN
5000.0000 [IU] | Freq: Three times a day (TID) | INTRAMUSCULAR | Status: DC
  Administered 2020-08-27 – 2020-09-02 (×13): 5000 [IU] via SUBCUTANEOUS
  Filled 2020-08-27 (×13): qty 1

## 2020-08-27 MED ORDER — SODIUM CHLORIDE 0.9 % IV SOLN: INTRAVENOUS | Status: DC

## 2020-08-27 MED ORDER — ONDANSETRON HCL 4 MG/2ML IJ SOLN
4.0000 mg | Freq: Four times a day (QID) | INTRAMUSCULAR | Status: DC | PRN
  Administered 2020-08-28: 4 mg via INTRAVENOUS
  Filled 2020-08-27: qty 2

## 2020-08-27 MED ORDER — INSULIN GLARGINE 100 UNIT/ML ~~LOC~~ SOLN
30.0000 [IU] | Freq: Every day | SUBCUTANEOUS | Status: DC
  Administered 2020-08-27: 30 [IU] via SUBCUTANEOUS
  Filled 2020-08-27 (×2): qty 0.3

## 2020-08-27 MED ORDER — PENTAFLUOROPROP-TETRAFLUOROETH EX AERO: 1.0000 "application " | INHALATION_SPRAY | CUTANEOUS | Status: DC | PRN

## 2020-08-27 MED ORDER — FLUTICASONE FUROATE-VILANTEROL 100-25 MCG/INH IN AEPB
1.0000 | INHALATION_SPRAY | Freq: Every day | RESPIRATORY_TRACT | Status: DC
  Administered 2020-08-29 – 2020-09-06 (×9): 1 via RESPIRATORY_TRACT
  Filled 2020-08-27: qty 28

## 2020-08-27 MED ORDER — CHLORHEXIDINE GLUCONATE CLOTH 2 % EX PADS
6.0000 | MEDICATED_PAD | Freq: Every day | CUTANEOUS | Status: DC
  Administered 2020-08-30 – 2020-09-01 (×2): 6 via TOPICAL

## 2020-08-27 MED ORDER — ACETAMINOPHEN 650 MG RE SUPP: 650.0000 mg | Freq: Four times a day (QID) | RECTAL | Status: DC | PRN

## 2020-08-27 MED ORDER — ROSUVASTATIN CALCIUM 20 MG PO TABS
20.0000 mg | ORAL_TABLET | Freq: Every day | ORAL | Status: DC
  Administered 2020-08-28: 20 mg via ORAL
  Filled 2020-08-27: qty 1

## 2020-08-27 MED ORDER — MAGNESIUM HYDROXIDE 400 MG/5ML PO SUSP: 30.0000 mL | Freq: Every day | ORAL | Status: DC | PRN

## 2020-08-27 MED ORDER — INSULIN DEGLUDEC 100 UNIT/ML ~~LOC~~ SOPN: 30.0000 [IU] | PEN_INJECTOR | Freq: Every day | SUBCUTANEOUS | Status: DC

## 2020-08-27 MED ORDER — POLYVINYL ALCOHOL 1.4 % OP SOLN: 1.0000 [drp] | Freq: Every day | OPHTHALMIC | Status: DC | PRN

## 2020-08-27 MED ORDER — LIDOCAINE HCL (PF) 1 % IJ SOLN: 5.0000 mL | INTRAMUSCULAR | Status: DC | PRN

## 2020-08-27 MED ORDER — RENA-VITE PO TABS
1.0000 | ORAL_TABLET | Freq: Every day | ORAL | Status: DC
  Administered 2020-08-28 – 2020-08-31 (×4): 1 via ORAL
  Filled 2020-08-27 (×5): qty 1

## 2020-08-27 MED ORDER — ALBUTEROL SULFATE HFA 108 (90 BASE) MCG/ACT IN AERS
2.0000 | INHALATION_SPRAY | RESPIRATORY_TRACT | Status: DC | PRN
  Filled 2020-08-27: qty 6.7

## 2020-08-27 MED ORDER — CARBOXYMETHYLCELLULOSE SODIUM 0.25 % OP SOLN: Freq: Every day | OPHTHALMIC | Status: DC | PRN

## 2020-08-27 MED ORDER — TRAZODONE HCL 50 MG PO TABS
25.0000 mg | ORAL_TABLET | Freq: Every evening | ORAL | Status: DC | PRN
  Administered 2020-08-31: 25 mg via ORAL
  Filled 2020-08-27 (×3): qty 1

## 2020-08-27 MED ORDER — SODIUM CHLORIDE 0.9 % IV SOLN: 100.0000 mL | INTRAVENOUS | Status: DC | PRN

## 2020-08-27 MED ORDER — ACETAMINOPHEN 325 MG PO TABS: 650.0000 mg | ORAL_TABLET | Freq: Four times a day (QID) | ORAL | Status: DC | PRN

## 2020-08-27 MED ORDER — SODIUM CHLORIDE 0.9 % IV SOLN
1.0000 g | INTRAVENOUS | Status: DC
  Administered 2020-08-28 – 2020-08-30 (×4): 1 g via INTRAVENOUS
  Filled 2020-08-27 (×5): qty 1

## 2020-08-27 MED ORDER — IOHEXOL 300 MG/ML  SOLN: 100.0000 mL | Freq: Once | INTRAMUSCULAR | Status: DC | PRN

## 2020-08-27 MED ORDER — DIPHENHYDRAMINE HCL 25 MG PO CAPS: 25.0000 mg | ORAL_CAPSULE | Freq: Every day | ORAL | Status: DC | PRN

## 2020-08-27 MED ORDER — VANCOMYCIN HCL IN DEXTROSE 1-5 GM/200ML-% IV SOLN: 1000.0000 mg | Freq: Once | INTRAVENOUS | Status: DC

## 2020-08-27 MED ORDER — HEPARIN SODIUM (PORCINE) 1000 UNIT/ML DIALYSIS: 1000.0000 [IU] | INTRAMUSCULAR | Status: DC | PRN

## 2020-08-27 MED ORDER — MORPHINE SULFATE (PF) 2 MG/ML IV SOLN
2.0000 mg | INTRAVENOUS | Status: DC | PRN
Start: 2020-08-27 — End: 2020-08-29

## 2020-08-27 MED ORDER — HEPARIN SODIUM (PORCINE) 1000 UNIT/ML DIALYSIS
5000.0000 [IU] | INTRAMUSCULAR | Status: DC | PRN
  Filled 2020-08-27: qty 5

## 2020-08-27 MED ORDER — ONDANSETRON HCL 4 MG PO TABS: 4.0000 mg | ORAL_TABLET | Freq: Four times a day (QID) | ORAL | Status: DC | PRN

## 2020-08-27 MED ORDER — ENOXAPARIN SODIUM 40 MG/0.4ML ~~LOC~~ SOLN: 40.0000 mg | SUBCUTANEOUS | Status: DC

## 2020-08-27 NOTE — Consult Note (Addendum)
Reason for Consult: ESRD Referring Physician:  Dr. Calvert Cantor  Chief Complaint: Abdominal pain  [facility] [facility] TueThuSat, 3 hrs 45 min, 180NRe Optiflux, BFR 400, DFR Manual 500 mL/min, EDW 87 (kg), Dialysate 2.0 K, 2.25 Ca, 1.0 Mg, 100 Dextrose ([id number]), Sodium 137 (mEq/L), Bicarb Setting: 35 (mEq/L), UFR Profile: Profile 2, Sodium Model: None, lt BBT  Mircera 100 (last given 08/01/2020), Hectorol 61mg Heparin 5000 units bolus + 1000 middose   Assessment/Plan: 1. ESRD - last dialysis was Thursday. [facility] No absolute indication tonight. 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 Last Phos on 08/24/20 was 9.3; likely will be NPO as well bec of SBO. 3. Anemia - @ goal. Will hold ESA's for now, usually on Mircera 100 (last given 08/01/2020) 4. SBO - appears to be secondary to malignancy. Per primary but she had missed multiple f/u's with GI. 5. DM 6. HTN 7. PAD -scheduled for amputation of the right 3rd toe next week.   HPI: [name] GIMPELis an 70y.o. female PAD, asthma, CASHD, DM with retinopathy and foot ulcers, HTN HLD RCC (rt side) PAD with amputation of the right 2nd toe and lt 2nd toe + GT as [facility] last dialysis was on Thursday where she left at 87.4kg. She is here with nausea for the past 2 days, severely constipated a week ago before having BM's with OTC medications. She denies melena or hematochezia but has severe abdominal pain in the [place] well as across the epigastric area for the past week. She also c/o dysuria but no dysuria. She denies fever, chills, myagias, cough, sore throat or sick contacts. Her appetite has not been good and she states that she has lost almost 50 lbs since being on dialysis. She also has a chronic ulcer on her right 3rd toe and was scheduled for aputation next week. Abx was  switched to Linezolid based on wound cultures.   . She is here with nausea for the past 2 days as well   Review of Systems  Musculoskeletal: Negative.    Pertinent items are noted in HPI.  Chemistry and CBC: Creat  Date/Time Value Ref Range Status  02/07/2020 11:59 AM 6.19 (H) 0.50 - 0.99 mg/dL Final    Comment:    For patients >427years of age, the reference limit for Creatinine is approximately 13% higher for people identified as African-American. .Marland Kitchen  11/03/2019 11:31 AM 4.00 (H) 0.50 - 0.99 mg/dL Final    Comment:    For patients >424years of age, the reference limit for Creatinine is approximately 13% higher for people identified as African-American. .   05/06/2019 12:29 PM 5.56 (H) 0.50 - 0.99 mg/dL Final    Comment:    For patients >443years of age, the reference limit for Creatinine is approximately 13% higher for people identified as African-American. .   10/01/2018 11:06 AM 4.40 (H) 0.50 - 0.99 mg/dL Final    Comment:    For patients >4104years of age, the reference limit for Creatinine is approximately 13% higher for people identified as African-American. .   04/02/2018 12:29 PM 3.75 (H) 0.50 - 0.99 mg/dL Final    Comment:    For patients >426years of age, the reference limit for Creatinine is approximately 13%  higher for people identified as African-American. .   01/01/2018 12:18 PM 3.88 (H) 0.50 - 0.99 mg/dL Final    Comment:    For patients >32 years of age, the reference limit for Creatinine is approximately 13% higher for people identified as African-American. .   10/01/2017 12:59 PM 3.23 (H) 0.50 - 0.99 mg/dL Final    Comment:    For patients >30 years of age, the reference limit for Creatinine is approximately 13% higher for people identified as African-American. .   07/07/2017 12:13 PM 3.28 (H) 0.50 - 0.99 mg/dL Final    Comment:    For patients >59 years of age, the reference limit for Creatinine is approximately 13% higher for  people identified as African-American. .   06/04/2017 12:49 PM 3.2 (H) 0.50 - 0.99 mg/dL Final    Comment:    For patients >30 years of age, the reference limit for Creatinine is approximately 13% higher for people identified as African-American. .   05/21/2017 12:27 PM 3.30 (H) 0.50 - 0.99 mg/dL Final    Comment:    For patients >78 years of age, the reference limit for Creatinine is approximately 13% higher for people identified as African-American. Marland Kitchen   04/07/2017 12:14 PM 2.89 (H) 0.50 - 0.99 mg/dL Final    Comment:    For patients >46 years of age, the reference limit for Creatinine is approximately 13% higher for people identified as African-American. .   12/30/2016 11:53 AM 2.12 (H) 0.50 - 0.99 mg/dL Final    Comment:      For patients > or = 70 years of age: The upper reference limit for Creatinine is approximately 13% higher for people identified as African-American.     09/24/2016 12:05 PM 2.12 (H) 0.50 - 0.99 mg/dL Final    Comment:      For patients > or = 70 years of age: The upper reference limit for Creatinine is approximately 13% higher for people identified as African-American.     06/10/2016 12:31 PM 1.80 (H) 0.50 - 0.99 mg/dL Final    Comment:      For patients > or = 70 years of age: The upper reference limit for Creatinine is approximately 13% higher for people identified as African-American.     02/26/2016 10:38 AM 1.48 (H) 0.50 - 0.99 mg/dL Final    Comment:      For patients > or = 70 years of age: The upper reference limit for Creatinine is approximately 13% higher for people identified as African-American.     12/11/2015 03:50 PM 1.53 (H) 0.50 - 0.99 mg/dL Final   Creatinine, Ser  Date/Time Value Ref Range Status  08/27/2020 01:50 PM 8.79 (H) 0.44 - 1.00 mg/dL Final  05/19/2020 12:54 PM 5.63 (H) 0.44 - 1.00 mg/dL Final  11/08/2019 08:52 AM 7.38 (H) 0.44 - 1.00 mg/dL Final  10/29/2019 03:30 PM 4.66 (H) 0.44 - 1.00 mg/dL Final   03/14/2017 04:52 AM 3.83 (H) 0.44 - 1.00 mg/dL Final  03/13/2017 05:19 AM 3.92 (H) 0.44 - 1.00 mg/dL Final  03/12/2017 05:38 AM 3.61 (H) 0.44 - 1.00 mg/dL Final  03/11/2017 04:59 AM 3.14 (H) 0.44 - 1.00 mg/dL Final  03/04/2017 10:00 AM 1.99 (H) 0.44 - 1.00 mg/dL Final  11/28/2012 10:53 PM 1.46 (H) 0.50 - 1.10 mg/dL Final   Recent Labs  Lab 08/27/20 1350  NA 137  K 4.9  CL 93*  CO2 22  GLUCOSE 260*  BUN 71*  CREATININE 8.79*  CALCIUM  8.2*   Recent Labs  Lab 08/27/20 1350  WBC 10.7*  HGB 12.8  HCT 38.4  MCV 94.1  PLT 204   Liver Function Tests: Recent Labs  Lab 08/27/20 1350  AST 20  ALT 12  ALKPHOS 72  BILITOT 1.1  PROT 7.4  ALBUMIN 3.5   Recent Labs  Lab 08/27/20 1350  LIPASE 33   No results for input(s): AMMONIA in the last 168 hours. Cardiac Enzymes: No results for input(s): CKTOTAL, CKMB, CKMBINDEX, TROPONINI in the last 168 hours. Iron Studies: No results for input(s): IRON, TIBC, TRANSFERRIN, FERRITIN in the last 72 hours. PT/INR: $RemoveBefore'@LABRCNTIP'[id number]$ (inr:5)  Xrays/Other Studies: ) Results for orders placed or performed during the hospital encounter of 08/27/20 (from the past 48 hour(s))  Lipase, blood     Status: None   Collection Time: 08/27/20  1:50 PM  Result Value Ref Range   Lipase 33 11 - 51 U/L    Comment: Performed at [facility], [place] [address] [place], [place] [address]  Comprehensive metabolic panel     Status: Abnormal   Collection Time: 08/27/20  1:50 PM  Result Value Ref Range   Sodium 137 135 - 145 mmol/L   Potassium 4.9 3.5 - 5.1 mmol/L   Chloride 93 (L) 98 - 111 mmol/L   CO2 22 22 - 32 mmol/L   Glucose, Bld 260 (H) 70 - 99 mg/dL    Comment: Glucose reference range applies only to samples taken after fasting for at least 8 hours.   BUN 71 (H) 8 - 23 mg/dL   Creatinine, Ser 8.79 (H) 0.44 - 1.00 mg/dL   Calcium 8.2 (L) 8.9 - 10.3 mg/dL   Total Protein 7.4 6.5 - 8.1 g/dL   Albumin 3.5 3.5 - 5.0 g/dL   AST 20 15 -  41 U/L   ALT 12 0 - 44 U/L   Alkaline Phosphatase 72 38 - 126 U/L   Total Bilirubin 1.1 0.3 - 1.2 mg/dL   GFR, Estimated 4 (L) >60 mL/min    Comment: (NOTE) Calculated using the CKD-EPI Creatinine Equation (2021)    Anion gap 22 (H) 5 - 15    Comment: REPEATED TO VERIFY Performed at [facility] [address], [place], [place] [address]   CBC     Status: Abnormal   Collection Time: 08/27/20  1:50 PM  Result Value Ref Range   WBC 10.7 (H) 4.0 - 10.5 K/uL   RBC 4.08 3.87 - 5.11 MIL/uL   Hemoglobin 12.8 12.0 - 15.0 g/dL   HCT 38.4 36.0 - 46.0 %   MCV 94.1 80.0 - 100.0 fL   MCH 31.4 26.0 - 34.0 pg   MCHC 33.3 30.0 - 36.0 g/dL   RDW 15.2 11.5 - 15.5 %   Platelets 204 150 - 400 K/uL   nRBC 0.0 0.0 - 0.2 %    Comment: Performed at [facility], [place] [address] [place], [place] [address]   CT ABDOMEN PELVIS WO CONTRAST  Result Date: 08/27/2020 CLINICAL DATA:  Nephrectomy for right-sided renal cell carcinoma. Concern for bowel obstruction. EXAM: CT ABDOMEN AND PELVIS WITHOUT CONTRAST TECHNIQUE: Multidetector CT imaging of the abdomen and pelvis was performed following the standard protocol without IV contrast. COMPARISON:  June 28, 2020 FINDINGS: Lower chest: The lung bases are clear. The heart size is normal. Hepatobiliary: The liver is normal. Normal gallbladder.There is no biliary ductal dilation. Pancreas: Normal contours without ductal dilatation. No peripancreatic fluid collection.  Spleen: Unremarkable. Adrenals/Urinary Tract: --Adrenal glands: Unremarkable. --Right kidney/ureter: The right kidney is surgically absent. --Left kidney/ureter: The left kidney appears atrophic. --Urinary bladder: Unremarkable. Stomach/Bowel: --Stomach/Duodenum: No hiatal hernia or other gastric abnormality. Normal duodenal course and caliber. --Small bowel: There are mildly dilated loops of small bowel scattered throughout the abdomen to the level of the patient's  ileocecal valve. --Colon: There is moderate distention of the cecum. There is an above transition point at the level of the ascending colon with there is a probable underlying colonic mass. The remaining portions of the patient's colon are completely decompressed aside from the rectum. --Appendix: Normal. Vascular/Lymphatic: Atherosclerotic calcification is present within the non-aneurysmal abdominal aorta, without hemodynamically significant stenosis. --No retroperitoneal lymphadenopathy. --there are few mildly enlarged mesenteric lymph nodes in the right lower quadrant and right mid abdomen. --No pelvic or inguinal lymphadenopathy. Reproductive: Unremarkable Other: No ascites or free air. The abdominal wall is normal. Musculoskeletal. No acute displaced fractures. IMPRESSION: 1. Small and large bowel obstruction that appears to be secondary to the previously demonstrated colonic mass in the ascending colon. 2. There are few mildly enlarged mesenteric lymph nodes in the right lower quadrant and right mid abdomen, concerning for nodal metastatic disease. 3. Status post right nephrectomy. 4. Atrophic left kidney. Aortic Atherosclerosis (ICD10-I70.0). Electronically Signed   By: Constance Holster M.D.   On: 08/27/2020 19:51    PMH:   Past Medical History:  Diagnosis Date  . Anemia   . Anemia associated with chronic renal failure   . Anemia in chronic kidney disease 09/29/2018  . Arthritis   . Asthma   . Blood transfusion without reported diagnosis    Phreesia 02/27/2020  . Cancer ([place])    Phreesia 02/27/2020  . Cataract    OD  . Chronic kidney disease    Phreesia 02/27/2020  . Coronary artery calcification seen on CAT scan 02/17/2018   Coronary calcification on CT  . Depression   . Diabetes mellitus without complication ([place])    Phreesia 02/27/2020  . Diabetic retinopathy of both eyes ([place])   . Diabetic ulcer of right foot associated with type 2 diabetes mellitus ([place])   . ESRD (end stage  renal disease) on dialysis ([place])   . Essential hypertension 02/17/2018   Essential hypertension  . GERD (gastroesophageal reflux disease)    pt denies  . HLD (hyperlipidemia)   . Hypertension   . Hypertensive retinopathy    OU  . Left ventricular dysfunction 04/07/2018   Left ventricle dysfunction  . Microalbuminuria due to type 2 diabetes mellitus ([place])   . Neuromuscular disorder ([place])    diabetic neuropathy  . Nonproliferative retinopathy due to secondary diabetes ([place])   . Nonsustained ventricular tachycardia ([place]) 08/28/2018   Nonsustained ventricular tachycardia  . Pneumonia    hx of   . Renal cell cancer ([place])   . Renal insufficiency   . Right renal mass 03/10/2017  . Type 2 diabetes, controlled, with neuropathy ([place]) 06/22/2013  . Ulcer of other part of foot 06/22/2013  . 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 AMPUTATION TOE Right 10/29/2019   Procedure: RIGHT 2ND TOE AMPUTATION;  Surgeon: Edrick Kins, DPM;  Location: [facility];  Service: Podiatry;  Laterality: Right;  . BASCILIC VEIN TRANSPOSITION Left 08/08/2017   Procedure: BASILIC VEIN TRANSPOSITION FIRST STAGE LEFT ARM;  Surgeon: Serafina Mitchell, MD;  Location: [facility];  Service: Vascular;  Laterality: Left;  . BASCILIC VEIN TRANSPOSITION Left 05/14/2018   Procedure: SECOND STAGE BASILIC VEIN TRANSPOSITION LEFT ARM;  Surgeon: Serafina Mitchell, MD;  Location: [facility];  Service: Vascular;  Laterality: Left;  . CATARACT EXTRACTION Left   . EYE SURGERY    . ROBOTIC ADRENALECTOMY Left 03/10/2017   Procedure: XI ROBOTIC ADRENALECTOMY;  Surgeon: Nickie Retort, MD;  Location: [facility];  Service: Urology;  Laterality: Left;  . ROBOTIC ASSITED PARTIAL NEPHRECTOMY Right 03/10/2017   Procedure: XI ROBOTIC ASSITED RADICAL NEPHRECTOMY;  Surgeon: Nickie Retort, MD;  Location: [facility];  Service: Urology;  Laterality: Right;  . TOE AMPUTATION  Bilateral    both great toe ,, left foot 2nd toe 1/2    Allergies:  Allergies  Allergen Reactions  . Penicillins Other (See Comments)    UNSPECIFIED CHILDHOOD REACTION  Has patient had a PCN reaction causing immediate rash, facial/tongue/throat swelling, SOB or lightheadedness with hypotension: Unknown Has patient had a PCN reaction causing severe rash involving mucus membranes or skin necrosis: Unknown Has patient had a PCN reaction that required hospitalization: Unknown Has patient had a PCN reaction occurring within the last 10 years: Unknown If all of the above answers are "NO", then may proceed with Cephalosporin use.   . Adhesive [Tape] Rash    Medications:   Prior to Admission medications   Medication Sig Start Date End Date Taking? Authorizing Provider  Blood Glucose Monitoring Suppl (ONE TOUCH ULTRA 2) w/Device KIT Use to check BS BID-QID Dx:E11.9 05/19/17   [name], MD  BREO ELLIPTA 100-25 MCG/INH AEPB Inhale 1 puff by mouth once daily 08/09/20   [name], MD  Calcium Carbonate Antacid 648 MG TABS Take by mouth. 06/27/20   [provider]  Carboxymethylcellulose Sodium (THERATEARS OP) Place 1 drop into both eyes daily as needed (dry eyes).    [provider]  diphenhydrAMINE (BENADRYL) 25 MG tablet Take 25 mg by mouth daily as needed for allergies.    [provider]  doxycycline (MONODOX) 100 MG capsule Take 100 mg by mouth 2 (two) times daily. 08/04/20   [provider]  doxycycline (VIBRA-TABS) 100 MG tablet Take 1 tablet (100 mg total) by mouth 2 (two) times daily. 08/18/20   Edrick Kins, DPM  gabapentin (NEURONTIN) 300 MG capsule TAKE 1 CAPSULE BY MOUTH THREE TIMES DAILY. REQUIRES OFFICE VISIT BEFORE ANY FURTHER REFILLS CAN BE GIVEN 06/21/20   [name], MD  insulin degludec (TRESIBA FLEXTOUCH) 100 UNIT/ML FlexTouch Pen Inject 0.3 mLs (30 Units total) into the skin daily. Hold if fasting blood sugars are <130.  02/11/20   [name], MD  Insulin Pen Needle (LIVE BETTER PEN NEEDLES) 31G X 6 MM MISC To use with Tresiba pens daily 02/05/19   [name], MD  Lancets [facility] ULTRASOFT) lancets Use as instructed 05/19/17   [name], MD  LOPERAMIDE HCL PO Take by mouth. 05/27/20 05/26/21  [provider]  multivitamin (RENA-VIT) TABS tablet Take 1 tablet by mouth daily.    [provider]  [facility] ULTRA test strip USE AS DIRECTED TO MONITOR  FSBS 3 TIMES DAILY 01/27/20   Ishmael Holter A, FNP  PROAIR HFA 108 [phone] Base) MCG/ACT inhaler INHALE 2 PUFFS BY MOUTH EVERY 6 HOURS AS NEEDED FOR WHEEZING FOR SHORTNESS OF BREATH 08/09/20   [name], MD  rosuvastatin (CRESTOR) 20 MG tablet Take 1 tablet (20 mg total) by mouth  daily. 11/23/19   [name], MD  SANTYL ointment Apply 1 application topically daily. 03/29/20   [provider]    Discontinued Meds:  There are no discontinued medications.  Social History:  reports that she has never smoked. She has never used smokeless tobacco. She reports current alcohol use. She reports that she does not use drugs.  Family History:   Family History  Problem Relation Age of Onset  . Heart disease Mother   . Diabetes Sister     Blood pressure 120/76, pulse 85, temperature 98.1 F (36.7 C), temperature source Oral, resp. rate 16, height 5' 7"  (1.702 m), weight 88.5 kg, SpO2 100 %. General appearance: alert, cooperative and appears stated age Head: Normocephalic, without obvious abnormality, atraumatic Eyes: negative Neck: no adenopathy, no carotid bruit, supple, symmetrical, trachea midline and thyroid not enlarged, symmetric, no tenderness/mass/nodules Back: symmetric, no curvature. ROM normal. No CVA tenderness. Resp: clear to auscultation bilaterally Chest wall: no tenderness Cardio: regular rate and rhythm GI: markedly decreased BS but no rebound Extremities: right foot 3rd digit ischemic changes Pulses:  1+ Skin: Skin color, texture, turgor normal. No rashes or lesions Access: Lt BBT good bruit       [name], [name] Oris, MD 08/27/2020, 9:18 PM

## 2020-08-27 NOTE — H&P (Addendum)
[place]   PATIENT NAME: [name]    MR#:  [id number]  DATE OF BIRTH:  [birth year]  DATE OF ADMISSION:  08/27/2020  PRIMARY CARE PHYSICIAN: [name], MD   REQUESTING/REFERRING PHYSICIAN: Truddie Hidden, MD  CHIEF COMPLAINT:   Chief Complaint  Patient presents with  . Animal Bite  . Abdominal Pain   Patient comes from: Home HISTORY OF PRESENT ILLNESS:  [name]  is a 70 y.o. Caucasian female with a known history of asthma, anemia, renal cell carcinoma status post radical nephrectomy, type 2 diabetes mellitus, peripheral vascular disease, end-stage renal disease on hemodialysis, hypertension, dyslipidemia, GERD and diabetic retinopathy as well as neuropathy, who presented to the emergency room with acute onset of abdominal pain with intractable and nausea and vomiting to liquids and solids as well as associated constipation over a week ago and has been taking over-the-counter stool softener with good results.  Her last well-formed bowel movement was earlier today.   Her abdominal pain has been s in the periumbilical lower abdomen especially in the right lower quadrant.  She denies any fever or chills.  No cough or wheezing or hemoptysis.  No chest pain or palpitations.  She admitted to nausea with diffuse abdominal pain that started about 5 days ago.  She missed hemodialysis on Tuesday and Saturday.  She is scheduled for amputation of the right third toe next week.  Her oral antibiotic therapy was switched to linezolid line based on recent culture results.  She thought that it may be the reason for her abdominal symptoms and she has been taking it for 3 days.  However her abdominal pain started 5 days ago as mentioned above.  She has not been able to take Tylenol as allowed over the last few days due to nausea for which she has no medications.  No fever or chills.  No chest pain or palpitations.  No cough or wheezing or hemoptysis. She also reports she was bit by her  dog on her left hand about 2 weeks ago. She has numerous excoriated ulcers on her hands and arms from picking including one near the site of the reported dog bite but no puncture wounds and no signs of infection.   Upon presentation to the emergency room, blood pressure was 104/89 with a pulse of 101 with otherwise normal vital signs.  Labs revealed a blood glucose of 260, BUN of 71 and creatinine of 8.79 with a calcium of 8.2 anion gap of 22.  Serum lipase was 33.  CBC showed WBC of 10.7.  Abdominal and pelvic CT scan showed the following: 1. Small and large bowel obstruction that appears to be secondary to the previously demonstrated colonic mass in the ascending colon. 2. There are few mildly enlarged mesenteric lymph nodes in the right lower quadrant and right mid abdomen, concerning for nodal metastatic disease. 3. Status post right nephrectomy. 4. Atrophic left kidney. 5. Aortic Atherosclerosis.  The patient was given 4 mg of IV Zofran.  She will be admitted to a medical telemetry bed at [facility] for further evaluation and management. PAST MEDICAL HISTORY:   Past Medical History:  Diagnosis Date  . Anemia   . Anemia associated with chronic renal failure   . Anemia in chronic kidney disease 09/29/2018  . Arthritis   . Asthma   . Blood transfusion without reported diagnosis    Phreesia 02/27/2020  . Cancer ([place])    Phreesia 02/27/2020  . Cataract  OD  . Chronic kidney disease    Phreesia 02/27/2020  . Coronary artery calcification seen on CAT scan 02/17/2018   Coronary calcification on CT  . Depression   . Diabetes mellitus without complication ([place])    Phreesia 02/27/2020  . Diabetic retinopathy of both eyes ([place])   . Diabetic ulcer of right foot associated with type 2 diabetes mellitus ([place])   . ESRD (end stage renal disease) on dialysis ([place])   . Essential hypertension 02/17/2018   Essential hypertension  . GERD (gastroesophageal reflux disease)    pt denies  . HLD  (hyperlipidemia)   . Hypertension   . Hypertensive retinopathy    OU  . Left ventricular dysfunction 04/07/2018   Left ventricle dysfunction  . Microalbuminuria due to type 2 diabetes mellitus ([place])   . Neuromuscular disorder ([place])    diabetic neuropathy  . Nonproliferative retinopathy due to secondary diabetes ([place])   . Nonsustained ventricular tachycardia ([place]) 08/28/2018   Nonsustained ventricular tachycardia  . Pneumonia    hx of   . Renal cell cancer ([place])   . Renal insufficiency   . Right renal mass 03/10/2017  . Type 2 diabetes, controlled, with neuropathy ([place]) 06/22/2013  . Ulcer of other part of foot 06/22/2013  . 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 AMPUTATION TOE Right 10/29/2019   Procedure: RIGHT 2ND TOE AMPUTATION;  Surgeon: Edrick Kins, DPM;  Location: [facility];  Service: Podiatry;  Laterality: Right;  . BASCILIC VEIN TRANSPOSITION Left 08/08/2017   Procedure: BASILIC VEIN TRANSPOSITION FIRST STAGE LEFT ARM;  Surgeon: Serafina Mitchell, MD;  Location: [place];  Service: Vascular;  Laterality: Left;  . BASCILIC VEIN TRANSPOSITION Left 05/14/2018   Procedure: SECOND STAGE BASILIC VEIN TRANSPOSITION LEFT ARM;  Surgeon: Serafina Mitchell, MD;  Location: [place];  Service: Vascular;  Laterality: Left;  . CATARACT EXTRACTION Left   . EYE SURGERY    . ROBOTIC ADRENALECTOMY Left 03/10/2017   Procedure: XI ROBOTIC ADRENALECTOMY;  Surgeon: Nickie Retort, MD;  Location: [facility];  Service: Urology;  Laterality: Left;  . ROBOTIC ASSITED PARTIAL NEPHRECTOMY Right 03/10/2017   Procedure: XI ROBOTIC ASSITED RADICAL NEPHRECTOMY;  Surgeon: Nickie Retort, MD;  Location: [facility];  Service: Urology;  Laterality: Right;  . TOE AMPUTATION Bilateral    both great toe ,, left foot 2nd toe 1/2    SOCIAL HISTORY:   Social History   Tobacco Use  . Smoking status: Never Smoker  .  Smokeless tobacco: Never Used  Substance Use Topics  . Alcohol use: Yes    Alcohol/week: 0.0 standard drinks    Comment: occ    FAMILY HISTORY:   Family History  Problem Relation Age of Onset  . Heart disease Mother   . Diabetes Sister     DRUG ALLERGIES:   Allergies  Allergen Reactions  . Penicillins Other (See Comments)    UNSPECIFIED CHILDHOOD REACTION  Has patient had a PCN reaction causing immediate rash, facial/tongue/throat swelling, SOB or lightheadedness with hypotension: Unknown Has patient had a PCN reaction causing severe rash involving mucus membranes or skin necrosis: Unknown Has patient had a PCN reaction that required hospitalization: Unknown Has patient had a PCN reaction occurring within the last 10 years: Unknown If all of the above answers are "NO", then may proceed with Cephalosporin use.   . Adhesive [Tape] Rash  REVIEW OF SYSTEMS:   ROS As per history of present illness. All pertinent systems were reviewed above. Constitutional, HEENT, cardiovascular, respiratory, GI, GU, musculoskeletal, neuro, psychiatric, endocrine, integumentary and hematologic systems were reviewed and are otherwise negative/unremarkable except for positive findings mentioned above in the HPI.   MEDICATIONS AT HOME:   Prior to Admission medications   Medication Sig Start Date End Date Taking? Authorizing Provider  Blood Glucose Monitoring Suppl (ONE TOUCH ULTRA 2) w/Device KIT Use to check BS BID-QID Dx:E11.9 05/19/17   [name], MD  BREO ELLIPTA 100-25 MCG/INH AEPB Inhale 1 puff by mouth once daily 08/09/20   [name], MD  Calcium Carbonate Antacid 648 MG TABS Take by mouth. 06/27/20   [provider]  Carboxymethylcellulose Sodium (THERATEARS OP) Place 1 drop into both eyes daily as needed (dry eyes).    [provider]  diphenhydrAMINE (BENADRYL) 25 MG tablet Take 25 mg by mouth daily as needed for allergies.    [provider]   doxycycline (MONODOX) 100 MG capsule Take 100 mg by mouth 2 (two) times daily. 08/04/20   [provider]  doxycycline (VIBRA-TABS) 100 MG tablet Take 1 tablet (100 mg total) by mouth 2 (two) times daily. 08/18/20   Edrick Kins, DPM  gabapentin (NEURONTIN) 300 MG capsule TAKE 1 CAPSULE BY MOUTH THREE TIMES DAILY. REQUIRES OFFICE VISIT BEFORE ANY FURTHER REFILLS CAN BE GIVEN 06/21/20   [name], MD  insulin degludec (TRESIBA FLEXTOUCH) 100 UNIT/ML FlexTouch Pen Inject 0.3 mLs (30 Units total) into the skin daily. Hold if fasting blood sugars are <130. 02/11/20   [name], MD  Insulin Pen Needle (LIVE BETTER PEN NEEDLES) 31G X 6 MM MISC To use with Tresiba pens daily 02/05/19   [name], MD  Lancets [facility] ULTRASOFT) lancets Use as instructed 05/19/17   [name], MD  LOPERAMIDE HCL PO Take by mouth. 05/27/20 05/26/21  [provider]  multivitamin (RENA-VIT) TABS tablet Take 1 tablet by mouth daily.    [provider]  [facility] ULTRA test strip USE AS DIRECTED TO MONITOR  FSBS 3 TIMES DAILY 01/27/20   Ishmael Holter A, FNP  PROAIR HFA 108 [phone] Base) MCG/ACT inhaler INHALE 2 PUFFS BY MOUTH EVERY 6 HOURS AS NEEDED FOR WHEEZING FOR SHORTNESS OF BREATH 08/09/20   [name], MD  rosuvastatin (CRESTOR) 20 MG tablet Take 1 tablet (20 mg total) by mouth daily. 11/23/19   [name], MD  SANTYL ointment Apply 1 application topically daily. 03/29/20   [provider]      VITAL SIGNS:  Blood pressure 120/76, pulse 85, temperature 98.1 F (36.7 C), temperature source Oral, resp. rate 16, height $RemoveBe'5\' 7"'lNqkWjSUg$  (1.702 m), weight 88.5 kg, SpO2 100 %.  PHYSICAL EXAMINATION:  Physical Exam  GENERAL:  70 y.o.-year-old Caucasian female patient lying in the bed with no acute distress.  EYES: Pupils equal, round, reactive to light and accommodation. No scleral icterus. Extraocular muscles intact.  HEENT: Head atraumatic, normocephalic.  Oropharynx and nasopharynx clear.  NECK:  Supple, no jugular venous distention. No thyroid enlargement, no tenderness.  LUNGS: Normal breath sounds bilaterally, no wheezing, rales, rhonchi or crepitation. No use of accessory muscles of respiration.  CARDIOVASCULAR: Regular rate and rhythm, S1, S2 normal. No murmurs, rubs, or gallops.  ABDOMEN: Soft, mildly distended and diffusely tender especially over the right lower quadrant.. Bowel sounds present. No organomegaly or discrete mass or felt.  EXTREMITIES: No  pedal edema, cyanosis, or clubbing.  NEUROLOGIC: Cranial nerves II through XII are intact. Muscle strength 5/5 in all extremities. Sensation intact. Gait not checked.  PSYCHIATRIC: The patient is alert and oriented x 3.  Normal affect and good eye contact. SKIN: She has multiple excoriated lesions on her arms and hands.  She has an amputated right big and second toe.  Swollen right third toe with dorsal ulcer and associated erythema without tenderness.    LABORATORY PANEL:   CBC Recent Labs  Lab 08/27/20 1350  WBC 10.7*  HGB 12.8  HCT 38.4  PLT 204   ------------------------------------------------------------------------------------------------------------------  Chemistries  Recent Labs  Lab 08/27/20 1350  NA 137  K 4.9  CL 93*  CO2 22  GLUCOSE 260*  BUN 71*  CREATININE 8.79*  CALCIUM 8.2*  AST 20  ALT 12  ALKPHOS 72  BILITOT 1.1   ------------------------------------------------------------------------------------------------------------------  Cardiac Enzymes No results for input(s): TROPONINI in the last 168 hours. ------------------------------------------------------------------------------------------------------------------  RADIOLOGY:  CT ABDOMEN PELVIS WO CONTRAST  Result Date: 08/27/2020 CLINICAL DATA:  Nephrectomy for right-sided renal cell carcinoma. Concern for bowel obstruction. EXAM: CT ABDOMEN AND PELVIS WITHOUT CONTRAST TECHNIQUE: Multidetector  CT imaging of the abdomen and pelvis was performed following the standard protocol without IV contrast. COMPARISON:  June 28, 2020 FINDINGS: Lower chest: The lung bases are clear. The heart size is normal. Hepatobiliary: The liver is normal. Normal gallbladder.There is no biliary ductal dilation. Pancreas: Normal contours without ductal dilatation. No peripancreatic fluid collection. Spleen: Unremarkable. Adrenals/Urinary Tract: --Adrenal glands: Unremarkable. --Right kidney/ureter: The right kidney is surgically absent. --Left kidney/ureter: The left kidney appears atrophic. --Urinary bladder: Unremarkable. Stomach/Bowel: --Stomach/Duodenum: No hiatal hernia or other gastric abnormality. Normal duodenal course and caliber. --Small bowel: There are mildly dilated loops of small bowel scattered throughout the abdomen to the level of the patient's ileocecal valve. --Colon: There is moderate distention of the cecum. There is an above transition point at the level of the ascending colon with there is a probable underlying colonic mass. The remaining portions of the patient's colon are completely decompressed aside from the rectum. --Appendix: Normal. Vascular/Lymphatic: Atherosclerotic calcification is present within the non-aneurysmal abdominal aorta, without hemodynamically significant stenosis. --No retroperitoneal lymphadenopathy. --there are few mildly enlarged mesenteric lymph nodes in the right lower quadrant and right mid abdomen. --No pelvic or inguinal lymphadenopathy. Reproductive: Unremarkable Other: No ascites or free air. The abdominal wall is normal. Musculoskeletal. No acute displaced fractures. IMPRESSION: 1. Small and large bowel obstruction that appears to be secondary to the previously demonstrated colonic mass in the ascending colon. 2. There are few mildly enlarged mesenteric lymph nodes in the right lower quadrant and right mid abdomen, concerning for nodal metastatic disease. 3. Status post  right nephrectomy. 4. Atrophic left kidney. Aortic Atherosclerosis (ICD10-I70.0). Electronically Signed   By: Constance Holster M.D.   On: 08/27/2020 19:51      IMPRESSION AND PLAN:   1.  Bowel obstruction including small and large bowel secondary to a right ascending colon mass. -The patient will be admitted to a medical telemetry bed at [facility]. -Pain management will be provided. -She will be kept n.p.o. -GI consultation will be obtained. -I notified [facility] about the routine a.m. consult. -Dr. Lyndel Safe is aware about the patient. -I also notified Dr. Rosendo Gros with general surgery about the patient. -The patient is currently agreeable to colonoscopy that she has recently refused. -An NG tube will be placed. -If the patient still here in a.m. general surgery  consultation could be obtained here.  2.  End-stage renal disease on hemodialysis. -Nephrology consultation can be obtained in a.m. for hemodialysis. -Dr. Augustin Coupe is aware about the patient and is planning for hemodialysis.  3.  Type 2 diabetes mellitus with diabetic neuropathy. -The patient will be placed on supplement coverage with NovoLog and will continue her basal coverage which will be cut down to half when she is n.p.o. -We will continue her Neurontin.  4.  Right third toe nonpurulent severe cellulitis and ulcer. -We will continue her on IV vancomycin and cefepime.  5.  Dyslipidemia. -We will continue her statin therapy.  6.  Asthma without exacerbation. -We will continue her albuterol inhaler and Breo Ellipta.   DVT prophylaxis: Subcutaneous heparin. Consults: Gastroenterology, general surgery and nephrology consults will be obtained. Discussion: As mentioned below the case was discussed with the patient and she agreed to proceed with the above-mentioned plan.   All the records are reviewed and case discussed with ED provider. The plan of care was discussed in details with the patient (and family). I  answered all questions. The patient agreed to proceed with the above mentioned plan. Further management will depend upon hospital course.   CODE STATUS: Full code  Status is: Inpatient  Remains inpatient appropriate because:Ongoing active pain requiring inpatient pain management, Ongoing diagnostic testing needed not appropriate for outpatient work up, Unsafe d/c plan, IV treatments appropriate due to intensity of illness or inability to take PO and Inpatient level of care appropriate due to severity of illness   Dispo: The patient is from: Home              Anticipated d/c is to: Home              Anticipated d/c date is: > 3 days              Patient currently is not medically stable to d/c.   Difficult to place patient No  TOTAL TIME TAKING CARE OF THIS PATIENT: 60 minutes.    Christel Mormon M.D on 08/27/2020 at 9:19 PM  [facility]   From 7 PM-7 AM, contact night-coverage [email or web address]  CC: Primary care physician; [name], MD

## 2020-08-27 NOTE — ED Provider Notes (Signed)
[facility] EMERGENCY DEPARTMENT Provider Note  CSN: [id number] Arrival date & time: 08/27/20 1313    History Chief Complaint  Patient presents with  . Animal Bite  . Abdominal Pain    HPI  [name] is a 70 y.o. female with history of multiple medical problems including ESRD on HD TThSa who presents for evaluation of abdominal pain. She reports she was constipated at home over a week ago but took an OTC stool softener and had good results. She began to have nausea and diffuse abdominal pain about 5 days ago so she didn't go to dialysis that day. She went on Th but missed Sa because she was not feeling well. She has a chronic ulcer on her R third toe and is scheduled for amputation next week. She reports her wound doctor did a culture recently and called to switch her oral Abx to Linezolid based on those results. She has been taking it for about 3 days and thought that might be the reason for her abdominal symptoms but admits her pain started prior to Abx change. She has not been able to take it the last few days due to nausea. Has not had any nausea meds. She Has not had any fever but she is concerned she may have a UTI.   She also reports she was bit by her dog on her L hand about 2 weeks ago. She has numerous excoriated ulcers on her hands and arms from picking including one near the site of the reported dog bite but no puncture wounds and no signs of infection.    Past Medical History:  Diagnosis Date  . Anemia   . Anemia associated with chronic renal failure   . Anemia in chronic kidney disease 09/29/2018  . Arthritis   . Asthma   . Blood transfusion without reported diagnosis    Phreesia 02/27/2020  . Cancer ([place])    Phreesia 02/27/2020  . Cataract    OD  . Chronic kidney disease    Phreesia 02/27/2020  . Coronary artery calcification seen on CAT scan 02/17/2018   Coronary calcification on CT  . Depression   . Diabetes mellitus without complication ([place])    Phreesia  02/27/2020  . Diabetic retinopathy of both eyes ([place])   . Diabetic ulcer of right foot associated with type 2 diabetes mellitus ([place])   . ESRD (end stage renal disease) on dialysis ([place])   . Essential hypertension 02/17/2018   Essential hypertension  . GERD (gastroesophageal reflux disease)    pt denies  . HLD (hyperlipidemia)   . Hypertension   . Hypertensive retinopathy    OU  . Left ventricular dysfunction 04/07/2018   Left ventricle dysfunction  . Microalbuminuria due to type 2 diabetes mellitus ([place])   . Neuromuscular disorder ([place])    diabetic neuropathy  . Nonproliferative retinopathy due to secondary diabetes ([place])   . Nonsustained ventricular tachycardia ([place]) 08/28/2018   Nonsustained ventricular tachycardia  . Pneumonia    hx of   . Renal cell cancer ([place])   . Renal insufficiency   . Right renal mass 03/10/2017  . Type 2 diabetes, controlled, with neuropathy ([place]) 06/22/2013  . Ulcer of other part of foot 06/22/2013  . 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 AMPUTATION TOE Right 10/29/2019   Procedure: RIGHT 2ND TOE AMPUTATION;  Surgeon: Edrick Kins, DPM;  Location: [facility];  Service: Podiatry;  Laterality: Right;  . BASCILIC VEIN TRANSPOSITION Left 08/08/2017   Procedure: BASILIC VEIN TRANSPOSITION FIRST STAGE LEFT ARM;  Surgeon: Serafina Mitchell, MD;  Location: [facility];  Service: Vascular;  Laterality: Left;  . BASCILIC VEIN TRANSPOSITION Left 05/14/2018   Procedure: SECOND STAGE BASILIC VEIN TRANSPOSITION LEFT ARM;  Surgeon: Serafina Mitchell, MD;  Location: [facility];  Service: Vascular;  Laterality: Left;  . CATARACT EXTRACTION Left   . EYE SURGERY    . ROBOTIC ADRENALECTOMY Left 03/10/2017   Procedure: XI ROBOTIC ADRENALECTOMY;  Surgeon: Nickie Retort, MD;  Location: [facility];  Service: Urology;  Laterality: Left;  . ROBOTIC ASSITED PARTIAL NEPHRECTOMY Right 03/10/2017   Procedure: XI ROBOTIC  ASSITED RADICAL NEPHRECTOMY;  Surgeon: Nickie Retort, MD;  Location: [facility];  Service: Urology;  Laterality: Right;  . TOE AMPUTATION Bilateral    both great toe ,, left foot 2nd toe 1/2    Family History  Problem Relation Age of Onset  . Heart disease Mother   . Diabetes Sister     Social History   Tobacco Use  . Smoking status: Never Smoker  . Smokeless tobacco: Never Used  Vaping Use  . Vaping Use: Never used  Substance Use Topics  . Alcohol use: Yes    Alcohol/week: 0.0 standard drinks    Comment: occ  . Drug use: No     Home Medications Prior to Admission medications   Medication Sig Start Date End Date Taking? Authorizing Provider  Blood Glucose Monitoring Suppl (ONE TOUCH ULTRA 2) w/Device KIT Use to check BS BID-QID Dx:E11.9 05/19/17   [name], MD  BREO ELLIPTA 100-25 MCG/INH AEPB Inhale 1 puff by mouth once daily 08/09/20   [name], MD  Calcium Carbonate Antacid 648 MG TABS Take by mouth. 06/27/20   [provider]  Carboxymethylcellulose Sodium (THERATEARS OP) Place 1 drop into both eyes daily as needed (dry eyes).    [provider]  diphenhydrAMINE (BENADRYL) 25 MG tablet Take 25 mg by mouth daily as needed for allergies.    [provider]  doxycycline (MONODOX) 100 MG capsule Take 100 mg by mouth 2 (two) times daily. 08/04/20   [provider]  doxycycline (VIBRA-TABS) 100 MG tablet Take 1 tablet (100 mg total) by mouth 2 (two) times daily. 08/18/20   Edrick Kins, DPM  gabapentin (NEURONTIN) 300 MG capsule TAKE 1 CAPSULE BY MOUTH THREE TIMES DAILY. REQUIRES OFFICE VISIT BEFORE ANY FURTHER REFILLS CAN BE GIVEN 06/21/20   [name], MD  insulin degludec (TRESIBA FLEXTOUCH) 100 UNIT/ML FlexTouch Pen Inject 0.3 mLs (30 Units total) into the skin daily. Hold if fasting blood sugars are <130. 02/11/20   [name], MD  Insulin Pen Needle (LIVE BETTER PEN NEEDLES) 31G X 6 MM MISC To use with Tresiba  pens daily 02/05/19   [name], MD  Lancets [facility] ULTRASOFT) lancets Use as instructed 05/19/17   [name], MD  LOPERAMIDE HCL PO Take by mouth. 05/27/20 05/26/21  [provider]  multivitamin (RENA-VIT) TABS tablet Take 1 tablet by mouth daily.    [provider]  [facility] ULTRA test strip USE AS DIRECTED TO MONITOR  FSBS 3 TIMES DAILY 01/27/20   Annie Main, FNP  PROAIR HFA 108 [id number] Base) MCG/ACT inhaler INHALE 2 PUFFS BY MOUTH EVERY 6 HOURS AS NEEDED FOR WHEEZING FOR SHORTNESS OF BREATH 08/09/20   [name], MD  rosuvastatin (CRESTOR) 20 MG tablet Take 1 tablet (20 mg total) by mouth daily. 11/23/19   [name], MD  SANTYL ointment Apply 1 application topically daily. 03/29/20   [provider]     Allergies    Penicillins and Adhesive [tape]   Review of Systems   Review of Systems A comprehensive review of systems was completed and negative except as noted in HPI.    Physical Exam BP 120/76   Pulse 85   Temp 98.1 F (36.7 C) (Oral)   Resp 16   Ht 5' 7"  (1.702 m)   Wt 88.5 kg   SpO2 100%   BMI 30.54 kg/m   Physical Exam Vitals and nursing note reviewed.  Constitutional:      Appearance: Normal appearance.  HENT:     Head: Normocephalic and atraumatic.     Nose: Nose normal.     Mouth/Throat:     Mouth: Mucous membranes are moist.  Eyes:     Extraocular Movements: Extraocular movements intact.     Conjunctiva/sclera: Conjunctivae normal.  Cardiovascular:     Rate and Rhythm: Normal rate.  Pulmonary:     Effort: Pulmonary effort is normal.     Breath sounds: Normal breath sounds.  Abdominal:     General: Abdomen is flat.     Palpations: Abdomen is soft.     Tenderness: There is generalized abdominal tenderness. There is no guarding. Negative signs include Murphy's sign and McBurney's sign.  Musculoskeletal:        General: No swelling. Normal range of motion.     Cervical back: Neck supple.  Skin:     General: Skin is warm and dry.     Comments: Multiple excoriated lesions on arms/hands from picking; she is s/p amputation of R 1st and 2nd toe with a chronic appearing ulcer on R 3rd toe, no signs of ascending infection  Neurological:     General: No focal deficit present.     Mental Status: She is alert.  Psychiatric:        Mood and Affect: Mood normal.      ED Results / Procedures / Treatments   Labs (all labs ordered are listed, but only abnormal results are displayed) Labs Reviewed  COMPREHENSIVE METABOLIC PANEL - Abnormal; Notable for the following components:      Result Value   Chloride 93 (*)    Glucose, Bld 260 (*)    BUN 71 (*)    Creatinine, Ser 8.79 (*)    Calcium 8.2 (*)    GFR, Estimated 4 (*)    Anion gap 22 (*)    All other components within normal limits  CBC - Abnormal; Notable for the following components:   WBC 10.7 (*)    All other components within normal limits  SARS CORONAVIRUS 2 BY RT PCR (HOSPITAL ORDER, [place] [facility])  LIPASE, BLOOD  URINALYSIS, ROUTINE W REFLEX MICROSCOPIC    EKG None  Radiology CT ABDOMEN PELVIS WO CONTRAST  Result Date: 08/27/2020 CLINICAL DATA:  Nephrectomy for right-sided renal cell carcinoma. Concern for bowel obstruction. EXAM: CT ABDOMEN AND PELVIS WITHOUT CONTRAST TECHNIQUE: Multidetector CT imaging of the abdomen and pelvis was performed following the standard protocol without IV contrast. COMPARISON:  June 28, 2020 FINDINGS: Lower chest: The lung bases are clear. The heart size is normal. Hepatobiliary: The liver is normal. Normal gallbladder.There is no biliary ductal dilation. Pancreas: Normal contours without ductal dilatation. No peripancreatic fluid collection.  Spleen: Unremarkable. Adrenals/Urinary Tract: --Adrenal glands: Unremarkable. --Right kidney/ureter: The right kidney is surgically absent. --Left kidney/ureter: The left kidney appears atrophic. --Urinary bladder: Unremarkable.  Stomach/Bowel: --Stomach/Duodenum: No hiatal hernia or other gastric abnormality. Normal duodenal course and caliber. --Small bowel: There are mildly dilated loops of small bowel scattered throughout the abdomen to the level of the patient's ileocecal valve. --Colon: There is moderate distention of the cecum. There is an above transition point at the level of the ascending colon with there is a probable underlying colonic mass. The remaining portions of the patient's colon are completely decompressed aside from the rectum. --Appendix: Normal. Vascular/Lymphatic: Atherosclerotic calcification is present within the non-aneurysmal abdominal aorta, without hemodynamically significant stenosis. --No retroperitoneal lymphadenopathy. --there are few mildly enlarged mesenteric lymph nodes in the right lower quadrant and right mid abdomen. --No pelvic or inguinal lymphadenopathy. Reproductive: Unremarkable Other: No ascites or free air. The abdominal wall is normal. Musculoskeletal. No acute displaced fractures. IMPRESSION: 1. Small and large bowel obstruction that appears to be secondary to the previously demonstrated colonic mass in the ascending colon. 2. There are few mildly enlarged mesenteric lymph nodes in the right lower quadrant and right mid abdomen, concerning for nodal metastatic disease. 3. Status post right nephrectomy. 4. Atrophic left kidney. Aortic Atherosclerosis (ICD10-I70.0). Electronically Signed   By: Constance Holster M.D.   On: 08/27/2020 19:51    Procedures Procedures  Medications Ordered in the ED Medications  iohexol (OMNIPAQUE) 300 MG/ML solution 100 mL (has no administration in time range)  ondansetron (ZOFRAN) injection 4 mg (4 mg Intravenous Given 08/27/20 1855)     MDM Rules/Calculators/A&P MDM Patient's CMP with ESRD but no signs of significant elyte abnormalities, WBC mildly elevated. Will send for CT given her tenderness. No treatment needed for remote dog bite.  ED Course   I have reviewed the triage vital signs and the nursing notes.  Pertinent labs & imaging results that were available during my care of the patient were reviewed by me and considered in my medical decision making (see chart for details).  Clinical Course as of 08/27/20 2104  [name] Aug 27, 2020  2025 Spoke with Dr. Michail Sermon, GI, who advises that the Hospitalist will need to call unassigned GI at [facility] when she arrives there for a consult.  [CS]  2030 CT images reviewed with the patient including signs of large and small bowel obstruction from likely colon mass that has been present on scans at least as far back as October, patient has cancelled all her previous GI appointments, she is anxious about colonoscopy due to complications her family members have had before. She is willing to stay in the hospital now for further evaluation. Will discuss with GI on call as well as [facility] regarding her ESRD and need for dialysis.  [CS]  2041 Spoke with Dr. Augustin Coupe, Nephrology who requests the patient be admitted at [facility] for dialysis.  [CS]  2102 Spoke with Dr. Sidney Ace, Hospitalist, who will evaluate her for admission.  [CS]    Clinical Course User Index [CS] Truddie Hidden, MD    Final Clinical Impression(s) / ED Diagnoses Final diagnoses:  Colonic mass  Large bowel obstruction ([place])  ESRD on hemodialysis ([place])  Chronic ulcer of toe of right foot, unspecified ulcer stage [facility])    Rx / DC Orders ED Discharge Orders    None       Truddie Hidden, MD 08/27/20 2104

## 2020-08-27 NOTE — ED Triage Notes (Signed)
Patient reports to the ER for abdominal pain and emesis. Patient reports she is a dialysis patient and was only able to go to Dialysis one day last week and one day this week. Patient also has a dog bit on her left hand

## 2020-08-27 NOTE — Progress Notes (Signed)
Pharmacy Antibiotic Note  [name] is a 70 y.o. female admitted on 08/27/2020 with cellulitis.  Pharmacy has been consulted for cefepime and vancomycin dosing.  Medical problems include ESRD on HD TThSa. Last dialysis was 08/24/20.  Planning on dialysis tomorrow, 08/28/20, after transfer to [facility].  Prior to admission, patient had been initiated on oral linezolid but will not continue due to nausea per discussion with Dr Sidney Ace.  Plan: Cefepime 1 g IV every 24 hours Vancomycin 2 g IV x 1 loading dose Vancomycin 1000 mg IV post-HD if session is tolerated. Goal trough 15-20 mcg/mL Follow up with cultures, antibiotic de-escalation and LOT Follow dialysis plans and clinical progress Collect vanc trough when indicated  Height: 5\' 7"  (170.2 cm) Weight: 88.5 kg (195 lb) IBW/kg (Calculated) : 61.6  Temp (24hrs), Avg:98.1 F (36.7 C), Min:98 F (36.7 C), Max:98.1 F (36.7 C)  Recent Labs  Lab 08/27/20 1350  WBC 10.7*  CREATININE 8.79*    Estimated Creatinine Clearance: 6.9 mL/min (A) (by C-G formula based on SCr of 8.79 mg/dL (H)).    Allergies  Allergen Reactions  . Latex Itching  . Penicillins Other (See Comments)    UNSPECIFIED CHILDHOOD REACTION  Has patient had a PCN reaction causing immediate rash, facial/tongue/throat swelling, SOB or lightheadedness with hypotension: Unknown Has patient had a PCN reaction causing severe rash involving mucus membranes or skin necrosis: Unknown Has patient had a PCN reaction that required hospitalization: Unknown Has patient had a PCN reaction occurring within the last 10 years: Unknown If all of the above answers are "NO", then may proceed with Cephalosporin use.   . Adhesive [Tape] Rash     Thank you for allowing pharmacy to be a part of this patient's care.  Shyne Resch P. Legrand Como, PharmD, [place] Please utilize Amion for appropriate phone number to reach the unit pharmacist ([place]) 08/27/2020 10:06 PM

## 2020-08-28 ENCOUNTER — Inpatient Hospital Stay (HOSPITAL_COMMUNITY): Payer: Medicare Other

## 2020-08-28 DIAGNOSIS — K6389 Other specified diseases of intestine: Secondary | ICD-10-CM

## 2020-08-28 DIAGNOSIS — E1140 Type 2 diabetes mellitus with diabetic neuropathy, unspecified: Secondary | ICD-10-CM

## 2020-08-28 DIAGNOSIS — E7800 Pure hypercholesterolemia, unspecified: Secondary | ICD-10-CM

## 2020-08-28 DIAGNOSIS — N186 End stage renal disease: Secondary | ICD-10-CM

## 2020-08-28 DIAGNOSIS — L03119 Cellulitis of unspecified part of limb: Secondary | ICD-10-CM

## 2020-08-28 DIAGNOSIS — L97519 Non-pressure chronic ulcer of other part of right foot with unspecified severity: Secondary | ICD-10-CM | POA: Diagnosis not present

## 2020-08-28 DIAGNOSIS — G629 Polyneuropathy, unspecified: Secondary | ICD-10-CM

## 2020-08-28 DIAGNOSIS — E11628 Type 2 diabetes mellitus with other skin complications: Secondary | ICD-10-CM | POA: Diagnosis not present

## 2020-08-28 DIAGNOSIS — K56600 Partial intestinal obstruction, unspecified as to cause: Secondary | ICD-10-CM | POA: Diagnosis not present

## 2020-08-28 DIAGNOSIS — M86671 Other chronic osteomyelitis, right ankle and foot: Secondary | ICD-10-CM

## 2020-08-28 DIAGNOSIS — M86672 Other chronic osteomyelitis, left ankle and foot: Secondary | ICD-10-CM

## 2020-08-28 DIAGNOSIS — J4520 Mild intermittent asthma, uncomplicated: Secondary | ICD-10-CM

## 2020-08-28 LAB — COMPREHENSIVE METABOLIC PANEL
ALT: 11 U/L (ref 0–44)
AST: 17 U/L (ref 15–41)
Albumin: 2.9 g/dL — ABNORMAL LOW (ref 3.5–5.0)
Alkaline Phosphatase: 59 U/L (ref 38–126)
Anion gap: 20 — ABNORMAL HIGH (ref 5–15)
BUN: 76 mg/dL — ABNORMAL HIGH (ref 8–23)
CO2: 24 mmol/L (ref 22–32)
Calcium: 7.7 mg/dL — ABNORMAL LOW (ref 8.9–10.3)
Chloride: 93 mmol/L — ABNORMAL LOW (ref 98–111)
Creatinine, Ser: 8.71 mg/dL — ABNORMAL HIGH (ref 0.44–1.00)
GFR, Estimated: 5 mL/min — ABNORMAL LOW (ref >60)
Glucose, Bld: 143 mg/dL — ABNORMAL HIGH (ref 70–99)
Potassium: 5.9 mmol/L — ABNORMAL HIGH (ref 3.5–5.1)
Sodium: 137 mmol/L (ref 135–145)
Total Bilirubin: 0.9 mg/dL (ref 0.3–1.2)
Total Protein: 6.3 g/dL — ABNORMAL LOW (ref 6.5–8.1)

## 2020-08-28 LAB — GLUCOSE, CAPILLARY
Glucose-Capillary: 57 mg/dL — ABNORMAL LOW (ref 70–99)
Glucose-Capillary: 124 mg/dL — ABNORMAL HIGH (ref 70–99)
Glucose-Capillary: 97 mg/dL (ref 70–99)

## 2020-08-28 LAB — HEMOGLOBIN A1C
Hgb A1c MFr Bld: 7.8 % — ABNORMAL HIGH (ref 4.8–5.6)
Mean Plasma Glucose: 177.16 mg/dL

## 2020-08-28 LAB — CBG MONITORING, ED: Glucose-Capillary: 98 mg/dL (ref 70–99)

## 2020-08-28 LAB — CBC
HCT: 34.5 % — ABNORMAL LOW (ref 36.0–46.0)
Hemoglobin: 11.3 g/dL — ABNORMAL LOW (ref 12.0–15.0)
MCH: 31.3 pg (ref 26.0–34.0)
MCHC: 32.8 g/dL (ref 30.0–36.0)
MCV: 95.6 fL (ref 80.0–100.0)
Platelets: 160 K/uL (ref 150–400)
RBC: 3.61 MIL/uL — ABNORMAL LOW (ref 3.87–5.11)
RDW: 15.2 % (ref 11.5–15.5)
WBC: 9.4 K/uL (ref 4.0–10.5)
nRBC: 0 % (ref 0.0–0.2)

## 2020-08-28 LAB — HIV ANTIBODY (ROUTINE TESTING W REFLEX): HIV Screen 4th Generation wRfx: NONREACTIVE

## 2020-08-28 MED ORDER — DEXTROSE 50 % IV SOLN
25.0000 g | Freq: Once | INTRAVENOUS | Status: DC
  Filled 2020-08-28: qty 50

## 2020-08-28 MED ORDER — PEG-KCL-NACL-NASULF-NA ASC-C 100 G PO SOLR
0.5000 | Freq: Once | ORAL | Status: AC
  Administered 2020-08-29: 100 g via ORAL
  Filled 2020-08-28: qty 1

## 2020-08-28 MED ORDER — PEG-KCL-NACL-NASULF-NA ASC-C 100 G PO SOLR: 1.0000 | Freq: Two times a day (BID) | ORAL | Status: DC

## 2020-08-28 MED ORDER — INSULIN GLARGINE 100 UNIT/ML ~~LOC~~ SOLN
15.0000 [IU] | Freq: Every day | SUBCUTANEOUS | Status: DC
  Administered 2020-08-28: 15 [IU] via SUBCUTANEOUS
  Filled 2020-08-28 (×2): qty 0.15

## 2020-08-28 MED ORDER — INSULIN ASPART 100 UNIT/ML ~~LOC~~ SOLN
0.0000 [IU] | Freq: Three times a day (TID) | SUBCUTANEOUS | Status: DC
  Filled 2020-08-28: qty 0.15

## 2020-08-28 MED ORDER — DEXTROSE 50 % IV SOLN
INTRAVENOUS | Status: AC
  Administered 2020-08-28: 50 mL
  Filled 2020-08-28: qty 50

## 2020-08-28 MED ORDER — MIDODRINE HCL 5 MG PO TABS: 10.0000 mg | ORAL_TABLET | Freq: Once | ORAL | Status: DC

## 2020-08-28 MED ORDER — MIDODRINE HCL 5 MG PO TABS
ORAL_TABLET | ORAL | Status: AC
  Filled 2020-08-28: qty 2

## 2020-08-28 MED ORDER — INSULIN ASPART 100 UNIT/ML ~~LOC~~ SOLN
0.0000 [IU] | SUBCUTANEOUS | Status: DC
  Administered 2020-08-31: 3 [IU] via SUBCUTANEOUS
  Administered 2020-08-31: 5 [IU] via SUBCUTANEOUS
  Administered 2020-09-01: 2 [IU] via SUBCUTANEOUS
  Administered 2020-09-01: 5 [IU] via SUBCUTANEOUS
  Administered 2020-09-01 – 2020-09-02 (×4): 3 [IU] via SUBCUTANEOUS

## 2020-08-28 MED ORDER — PEG-KCL-NACL-NASULF-NA ASC-C 100 G PO SOLR
0.5000 | Freq: Once | ORAL | Status: AC
  Administered 2020-08-28: 100 g via ORAL
  Filled 2020-08-28: qty 1

## 2020-08-28 MED ORDER — PANTOPRAZOLE SODIUM 40 MG IV SOLR
40.0000 mg | INTRAVENOUS | Status: DC
  Administered 2020-08-28 – 2020-09-06 (×9): 40 mg via INTRAVENOUS
  Filled 2020-08-28 (×9): qty 40

## 2020-08-28 MED ORDER — VANCOMYCIN HCL IN DEXTROSE 750-5 MG/150ML-% IV SOLN: 750.0000 mg | Freq: Once | INTRAVENOUS | Status: DC

## 2020-08-28 NOTE — Progress Notes (Signed)
[facility] Progress Note  Subjective: seen in HD unit, no c/o's today.   Vitals:   08/28/20 0500 08/28/20 0530 08/28/20 0630 08/28/20 0930  BP: 124/67 111/64 103/60 106/63  Pulse: 67 74 68 68  Resp: 14 17 13 13   Temp:      TempSrc:      SpO2: 99% 96% 96% 96%  Weight:      Height:        Exam:   alert, nad NG tube in place  no jvd  Chest cta bilat, poor air movement   Cor reg no RG  Abd soft ntnd no ascites   Ext no LE edema   Alert, NF, ox3   LUA aVF+bruit    OP HD: [facility]   3h 82min   400/500  87kg   2/2.25 bath  P2  LUA AVF Hep 5000+ 1000 midrun   - mircera 100ug last 1/11  - hect 3 ug  Assessment/ Plan: 1. ESRD - last dialysis was Thursday. Plan HD today.  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 Last Phos on 08/24/20 was 9.3; likely will be NPO as well bec of SBO. 3. Anemia - @ goal. Will hold ESA's for now, usually on Mircera 100 (last given 08/01/2020) 4. SBO - appears to be secondary to malignancy. Per primary but she had missed multiple f/u's with GI. 5. DM 6. HTN 7. PAD -scheduled for amputation of the right 3rd toe later this week.  Rob Kamie Korber 08/28/2020, 11:45 AM   Recent Labs  Lab 08/27/20 1350 08/28/20 0424  K 4.9 5.9*  BUN 71* 76*  CREATININE 8.79* 8.71*  CALCIUM 8.2* 7.7*  HGB 12.8 11.3*   Inpatient medications: . Bevacizumab  1.25 mg Intravitreal   . Bevacizumab  1.25 mg Intravitreal   . Bevacizumab  1.25 mg Intravitreal   . Bevacizumab  1.25 mg Intravitreal   . Bevacizumab  1.25 mg Intravitreal   . Chlorhexidine Gluconate Cloth  6 each Topical Q0600  . collagenase  1 application Topical [id number]  . fluticasone furoate-vilanterol  1 puff Inhalation Daily  . gabapentin  300 mg Oral TID  . heparin injection (subcutaneous)  5,000 Units Subcutaneous Q8H  . insulin aspart  0-15 Units Subcutaneous TID WC  .  insulin glargine  30 Units Subcutaneous QHS  . multivitamin  1 tablet Oral QHS  . rosuvastatin  20 mg Oral QHS   . sodium chloride 100 mL/hr at 08/28/20 0112  . sodium chloride    . sodium chloride    . ceFEPime (MAXIPIME) IV Stopped (08/28/20 0143)   sodium chloride, sodium chloride, acetaminophen **OR** acetaminophen, albuterol, alteplase, diphenhydrAMINE, heparin, heparin, iohexol, lidocaine (PF), lidocaine-prilocaine, magnesium hydroxide, morphine injection, ondansetron **OR** ondansetron (ZOFRAN) IV, pentafluoroprop-tetrafluoroeth, polyvinyl alcohol, traZODone

## 2020-08-28 NOTE — ED Notes (Signed)
Report given to dialysis unit at [facility].

## 2020-08-28 NOTE — Consult Note (Addendum)
[facility] Consult Note  [name] [birth year]  [id number].    Requesting MD: Irine Seal Chief Complaint: Abdominal pain and emesis Reason for Consult: Large and small bowel obstruction secondary to right colon mass HPI:  Patient is a 70 year old female with multiple medical problems including: End-stage renal disease, on hemodialysis TTS, PVD, type 2 diabetes with diabetic neuropathy/retinopathy, asthma, GERD, Hx renal cell carcinoma, s/p robotic assisted partial right nephrectomy/adrenalectomy 02/2017. She presented to the ED with a history of constipation for 1 week.  She took some stool softeners on 2/4 and took more on 2/5 which resulted in a moderate amount of stool.  She reports another BM yesterday.  She then developed nausea with multiple episodes of vomiting.  She presents with periumbilical and right lower abdominal pain which has become progressively worse.  Currently she is complains primarily of some discomfort in the right lower quadrant.  Work-up in the ED shows she is afebrile slightly tachycardic, blood pressure is stable, sats are good on room air.  Admission labs shows a glucose of 260, BUN of 71, creatinine 8.79, calcium 8.2, LFTs are normal.  WBC 10.7, hemoglobin 12.8, hematocrit 38.4, platelets 204,000.  Covid was negative.  A CT without contrast was obtained on 08/27/2020, this showed the lung bases were clear, right ureter surgically absent, left kidney appeared atrophic.  Stomach was normal.  Small bowel showed mildly dilated loops throughout the abdomen to the level of the ileocecal valve.  The colon showed moderate distention of the cecum there is a transition point at the level of the ascending colon with probable underlying colonic mass.  She has a CT scan from 05/19/2020 that showed distention of the cecum and proximal ascending colon with stool there was a transition to decompressed colon at the level of the mid ascending colon findings were concerning  for infectious VS inflammatory process, VS colonic mass.  There is also severe circumferential thickening of the urinary bladder with adjacent stranding.  She was apparently treated with antibiotics, but is never followed up on undergoing colonoscopy as recommended.  She has been admitted to Medicine at [facility] is being transferred to [facility] for dialysis.  She has been referred to GI who plans on water enemas for bowel prep and colonoscopy.  We are asked to see.   ROS: Review of Systems  Constitutional: Negative.   HENT: Negative.   Eyes: Negative.   Respiratory: Negative.        She is not vaccinated  Cardiovascular: Negative.        She has toes on both feet with issues, she is due for amputation on the right foot.  Gastrointestinal: Positive for abdominal pain, constipation (Had trouble with constipation on and off for some time.  She reports a BM yesterday) and diarrhea. Negative for blood in stool and melena.       She has never had a colonoscopy and is fearful of the procedure recommended last year.  Genitourinary:       She is on dialysis.  Musculoskeletal: Negative.   Skin: Negative.   Neurological: Negative.   Endo/Heme/Allergies: Bruises/bleeds easily.  Psychiatric/Behavioral: Negative.     Family History  Problem Relation Age of Onset  . Heart disease Mother   . Diabetes Sister     Past Medical History:  Diagnosis Date  . Anemia   . Anemia associated with chronic renal failure   . Anemia in chronic kidney disease 09/29/2018  . Arthritis   .  Asthma   . Blood transfusion without reported diagnosis    Phreesia 02/27/2020  . Cancer ([place])    Phreesia 02/27/2020  . Cataract    OD  . Chronic kidney disease    Phreesia 02/27/2020  . Coronary artery calcification seen on CAT scan 02/17/2018   Coronary calcification on CT  . Depression   . Diabetes mellitus without complication ([place])    Phreesia 02/27/2020  . Diabetic retinopathy of both eyes ([place])    . Diabetic ulcer of right foot associated with type 2 diabetes mellitus ([place])   . ESRD (end stage renal disease) on dialysis ([place])   . Essential hypertension 02/17/2018   Essential hypertension  . GERD (gastroesophageal reflux disease)    pt denies  . HLD (hyperlipidemia)   . Hypertension   . Hypertensive retinopathy    OU  . Left ventricular dysfunction 04/07/2018   Left ventricle dysfunction  . Microalbuminuria due to type 2 diabetes mellitus ([place])   . Neuromuscular disorder ([place])    diabetic neuropathy  . Nonproliferative retinopathy due to secondary diabetes ([place])   . Nonsustained ventricular tachycardia ([place]) 08/28/2018   Nonsustained ventricular tachycardia  . Pneumonia    hx of   . Renal cell cancer ([place])   . Renal insufficiency   . Right renal mass 03/10/2017  . Type 2 diabetes, controlled, with neuropathy ([place]) 06/22/2013  . Ulcer of other part of foot 06/22/2013  . 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 [facility])     Past Surgical History:  Procedure Laterality Date  . AMPUTATION TOE Right 10/29/2019   Procedure: RIGHT 2ND TOE AMPUTATION;  Surgeon: Edrick Kins, DPM;  Location: [facility];  Service: Podiatry;  Laterality: Right;  . BASCILIC VEIN TRANSPOSITION Left 08/08/2017   Procedure: BASILIC VEIN TRANSPOSITION FIRST STAGE LEFT ARM;  Surgeon: Serafina Mitchell, MD;  Location: [place];  Service: Vascular;  Laterality: Left;  . BASCILIC VEIN TRANSPOSITION Left 05/14/2018   Procedure: SECOND STAGE BASILIC VEIN TRANSPOSITION LEFT ARM;  Surgeon: Serafina Mitchell, MD;  Location: [place];  Service: Vascular;  Laterality: Left;  . CATARACT EXTRACTION Left   . EYE SURGERY    . ROBOTIC ADRENALECTOMY Left 03/10/2017   Procedure: XI ROBOTIC ADRENALECTOMY;  Surgeon: Nickie Retort, MD;  Location: [facility];  Service: Urology;  Laterality: Left;  . ROBOTIC ASSITED PARTIAL NEPHRECTOMY Right 03/10/2017   Procedure: XI ROBOTIC ASSITED RADICAL NEPHRECTOMY;  Surgeon: Nickie Retort, MD;  Location: [facility];  Service: Urology;  Laterality: Right;  . TOE AMPUTATION Bilateral    both great toe ,, left foot 2nd toe 1/2    Social History:  reports that she has never smoked. She has never used smokeless tobacco. She reports current alcohol use. She reports that she does not use drugs. Tobacco: None Drugs: None EtOH: Social  Allergies:  Allergies  Allergen Reactions  . Latex Itching  . Penicillins Other (See Comments)    UNSPECIFIED CHILDHOOD REACTION  Has patient had a PCN reaction causing immediate rash, facial/tongue/throat swelling, SOB or lightheadedness with hypotension: Unknown Has patient had a PCN reaction causing severe rash involving mucus membranes or skin necrosis: Unknown Has patient had a PCN reaction that required hospitalization: Unknown Has patient had a PCN reaction occurring within the last 10 years: Unknown If all of the above answers are "NO", then may proceed with Cephalosporin use.   . Adhesive [Tape] Rash    Prior to Admission medications   Medication Sig Start  Date End Date Taking? Authorizing Provider  BREO ELLIPTA 100-25 MCG/INH AEPB Inhale 1 puff by mouth once daily Patient taking differently: Inhale 1 puff into the lungs daily. 08/09/20  Yes [name], MD  Calcium Carbonate Antacid (TUMS PO) Take 1 tablet by mouth 3 (three) times daily with meals.   Yes [provider]  Carboxymethylcellulose Sodium (THERATEARS OP) Place 1 drop into both eyes daily as needed (dry eyes).   Yes [provider]  diphenhydrAMINE (BENADRYL) 25 MG tablet Take 25 mg by mouth daily as needed for allergies or itching.   Yes [provider]  gabapentin (NEURONTIN) 300 MG capsule TAKE 1 CAPSULE BY MOUTH THREE TIMES DAILY. REQUIRES OFFICE VISIT BEFORE ANY FURTHER REFILLS CAN BE GIVEN Patient taking differently: Take 300 mg by mouth 3 (three) times daily as needed (nerve pain). 06/21/20  Yes [name], MD  insulin degludec  (TRESIBA FLEXTOUCH) 100 UNIT/ML FlexTouch Pen Inject 0.3 mLs (30 Units total) into the skin daily. Hold if fasting blood sugars are <130. Patient taking differently: Inject 30-50 Units into the skin daily as needed (CBG >130). 02/11/20  Yes Pickard, Cammie Mcgee, MD  linezolid (ZYVOX) 600 MG tablet Take 600 mg by mouth 2 (two) times daily. 08/22/20  Yes [provider]  multivitamin (RENA-VIT) TABS tablet Take 1 tablet by mouth daily.   Yes [provider]  PROAIR HFA 108 (90 Base) MCG/ACT inhaler INHALE 2 PUFFS BY MOUTH EVERY 6 HOURS AS NEEDED FOR WHEEZING FOR SHORTNESS OF BREATH Patient taking differently: Inhale 2 puffs into the lungs every 6 (six) hours as needed for wheezing or shortness of breath. 08/09/20  Yes [name], MD  rosuvastatin (CRESTOR) 20 MG tablet Take 1 tablet (20 mg total) by mouth daily. 11/23/19  Yes [name], MD  SANTYL ointment Apply 1 application topically daily as needed (wound care). 03/29/20  Yes [provider]  Blood Glucose Monitoring Suppl (ONE TOUCH ULTRA 2) w/Device KIT Use to check BS BID-QID Dx:E11.9 05/19/17   [name], MD  doxycycline (VIBRA-TABS) 100 MG tablet Take 1 tablet (100 mg total) by mouth 2 (two) times daily. Patient not taking: No sig reported 08/18/20   Edrick Kins, DPM  Insulin Pen Needle (LIVE BETTER PEN NEEDLES) 31G X 6 MM MISC To use with Tresiba pens daily 02/05/19   [name], MD  Lancets [facility] ULTRASOFT) lancets Use as instructed 05/19/17   [name], MD  [facility] ULTRA test strip USE AS DIRECTED TO MONITOR  FSBS 3 TIMES DAILY 01/27/20   Ishmael Holter A, FNP     Blood pressure 106/63, pulse 68, temperature 98.1 F (36.7 C), temperature source Oral, resp. rate 13, height _0  (1.702 m), weight 88.5 kg, SpO2 96 %. Physical Exam:  General: pleasant, WD, WN white female who is laying in bed in NAD, sitting in chair for HD HEENT: head is normocephalic, atraumatic.  Sclera are  noninjected.  Pupils are equal ears and nose without any masses or lesions.  Mouth is pink and moist Heart: regular, rate, and rhythm.  Normal s1,s2. No obvious murmurs, gallops, or rubs noted.  She is on dialysis with an AV fistula on the left.  No palpable distal pulses lower extremities. Lungs: CTAB, no wheezes, rhonchi, or rales noted.  Respiratory effort nonlabored Abd: soft, complains of some tenderness on the right side, ND, +BS, no masses, hernias, or organomegaly MS: On HD, LUE AVF; no distal pulses right  2nd toe amp with drainage from 3rd toe, there is also an ulcer on the left foot also Skin: warm and dry with no masses, lesions, or rashes Neuro: Cranial nerves 2-12 grossly intact, sensation is normal throughout Psych: A&Ox3 with an appropriate affect.   Results for orders placed or performed during the hospital encounter of 08/27/20 (from the past 48 hour(s))  Lipase, blood     Status: None   Collection Time: 08/27/20  1:50 PM  Result Value Ref Range   Lipase 33 11 - 51 U/L    Comment: Performed at [facility], [place] [address] [place], [place] [address]  Comprehensive metabolic panel     Status: Abnormal   Collection Time: 08/27/20  1:50 PM  Result Value Ref Range   Sodium 137 135 - 145 mmol/L   Potassium 4.9 3.5 - 5.1 mmol/L   Chloride 93 (L) 98 - 111 mmol/L   CO2 22 22 - 32 mmol/L   Glucose, Bld 260 (H) 70 - 99 mg/dL    Comment: Glucose reference range applies only to samples taken after fasting for at least 8 hours.   BUN 71 (H) 8 - 23 mg/dL   Creatinine, Ser 8.79 (H) 0.44 - 1.00 mg/dL   Calcium 8.2 (L) 8.9 - 10.3 mg/dL   Total Protein 7.4 6.5 - 8.1 g/dL   Albumin 3.5 3.5 - 5.0 g/dL   AST 20 15 - 41 U/L   ALT 12 0 - 44 U/L   Alkaline Phosphatase 72 38 - 126 U/L   Total Bilirubin 1.1 0.3 - 1.2 mg/dL   GFR, Estimated 4 (L) >60 mL/min    Comment: (NOTE) Calculated using the CKD-EPI Creatinine Equation (2021)    Anion gap 22 (H) 5 - 15    Comment:  REPEATED TO VERIFY Performed at [facility] [facility] [address] [place], [place] [address]   CBC     Status: Abnormal   Collection Time: 08/27/20  1:50 PM  Result Value Ref Range   WBC 10.7 (H) 4.0 - 10.5 K/uL   RBC 4.08 3.87 - 5.11 MIL/uL   Hemoglobin 12.8 12.0 - 15.0 g/dL   HCT 38.4 36.0 - 46.0 %   MCV 94.1 80.0 - 100.0 fL   MCH 31.4 26.0 - 34.0 pg   MCHC 33.3 30.0 - 36.0 g/dL   RDW 15.2 11.5 - 15.5 %   Platelets 204 150 - 400 K/uL   nRBC 0.0 0.0 - 0.2 %    Comment: Performed at [facility], [place] [address] [place], [place] [address]  SARS Coronavirus 2 by RT PCR (hospital order, performed in [facility] hospital lab) Nasopharyngeal Nasopharyngeal Swab     Status: None   Collection Time: 08/27/20  9:46 PM   Specimen: Nasopharyngeal Swab  Result Value Ref Range   SARS Coronavirus 2 NEGATIVE NEGATIVE    Comment: (NOTE) SARS-CoV-2 target nucleic acids are NOT DETECTED.  The SARS-CoV-2 RNA is generally detectable in upper and lower respiratory specimens during the acute phase of infection. The lowest concentration of SARS-CoV-2 viral copies this assay can detect is 250 copies / mL. A negative result does not preclude SARS-CoV-2 infection and should not be used as the sole basis for treatment or other patient management decisions.  A negative result may occur with improper specimen collection / handling, submission of specimen other than nasopharyngeal swab, presence of viral mutation(s) within the areas targeted by this assay, and inadequate number of viral  copies (<250 copies / mL). A negative result must be combined with clinical observations, patient history, and epidemiological information.  Fact Sheet for Patients:   BoilerBrush.com.cy  Fact Sheet for Healthcare Providers: https://pope.com/  This test is not yet approved or  cleared by the [place] FDA and has been authorized for  detection and/or diagnosis of SARS-CoV-2 by FDA under an Emergency Use Authorization (EUA).  This EUA will remain in effect (meaning this test can be used) for the duration of the COVID-19 declaration under Section 564(b)(1) of the Act, 21 U.S.C. section 360bbb-3(b)(1), unless the authorization is terminated or revoked sooner.  Performed at [facility], [address] [place], [place] [address]   HIV Antibody (routine testing w rflx)     Status: None   Collection Time: 08/27/20  9:46 PM  Result Value Ref Range   HIV Screen 4th Generation wRfx Non Reactive Non Reactive    Comment: Performed at [facility], [address], [place], [place] [address]  Hemoglobin A1c     Status: Abnormal   Collection Time: 08/28/20 12:02 AM  Result Value Ref Range   Hgb A1c MFr Bld 7.8 (H) 4.8 - 5.6 %    Comment: (NOTE) Pre diabetes:          5.7%-6.4%  Diabetes:              >6.4%  Glycemic control for   <7.0% adults with diabetes    Mean Plasma Glucose 177.16 mg/dL    Comment: Performed at [facility], [address], [place], [place] [address]  Comprehensive metabolic panel     Status: Abnormal   Collection Time: 08/28/20  4:24 AM  Result Value Ref Range   Sodium 137 135 - 145 mmol/L   Potassium 5.9 (H) 3.5 - 5.1 mmol/L    Comment: DELTA CHECK NOTED   Chloride 93 (L) 98 - 111 mmol/L   CO2 24 22 - 32 mmol/L   Glucose, Bld 143 (H) 70 - 99 mg/dL    Comment: Glucose reference range applies only to samples taken after fasting for at least 8 hours.   BUN 76 (H) 8 - 23 mg/dL   Creatinine, Ser 2.77 (H) 0.44 - 1.00 mg/dL   Calcium 7.7 (L) 8.9 - 10.3 mg/dL   Total Protein 6.3 (L) 6.5 - 8.1 g/dL   Albumin 2.9 (L) 3.5 - 5.0 g/dL   AST 17 15 - 41 U/L   ALT 11 0 - 44 U/L   Alkaline Phosphatase 59 38 - 126 U/L   Total Bilirubin 0.9 0.3 - 1.2 mg/dL   GFR, Estimated 5 (L) >60 mL/min    Comment: (NOTE) Calculated using the CKD-EPI Creatinine Equation (2021)    Anion  gap 20 (H) 5 - 15    Comment: Performed at [facility], [address] [place], [place] [address]  CBC     Status: Abnormal   Collection Time: 08/28/20  4:24 AM  Result Value Ref Range   WBC 9.4 4.0 - 10.5 K/uL   RBC 3.61 (L) 3.87 - 5.11 MIL/uL   Hemoglobin 11.3 (L) 12.0 - 15.0 g/dL   HCT 53.6 (L) 14.4 - 31.5 %   MCV 95.6 80.0 - 100.0 fL   MCH 31.3 26.0 - 34.0 pg   MCHC 32.8 30.0 - 36.0 g/dL   RDW 40.0 86.7 - 61.9 %   Platelets 160 150 - 400 K/uL   nRBC 0.0 0.0 -  0.2 %    Comment: Performed at [facility], [place] [address] [place], [place] [address]  CBG monitoring, ED     Status: None   Collection Time: 08/28/20  7:51 AM  Result Value Ref Range   Glucose-Capillary 98 70 - 99 mg/dL    Comment: Glucose reference range applies only to samples taken after fasting for at least 8 hours.   CT ABDOMEN PELVIS WO CONTRAST  Result Date: 08/27/2020 CLINICAL DATA:  Nephrectomy for right-sided renal cell carcinoma. Concern for bowel obstruction. EXAM: CT ABDOMEN AND PELVIS WITHOUT CONTRAST TECHNIQUE: Multidetector CT imaging of the abdomen and pelvis was performed following the standard protocol without IV contrast. COMPARISON:  June 28, 2020 FINDINGS: Lower chest: The lung bases are clear. The heart size is normal. Hepatobiliary: The liver is normal. Normal gallbladder.There is no biliary ductal dilation. Pancreas: Normal contours without ductal dilatation. No peripancreatic fluid collection. Spleen: Unremarkable. Adrenals/Urinary Tract: --Adrenal glands: Unremarkable. --Right kidney/ureter: The right kidney is surgically absent. --Left kidney/ureter: The left kidney appears atrophic. --Urinary bladder: Unremarkable. Stomach/Bowel: --Stomach/Duodenum: No hiatal hernia or other gastric abnormality. Normal duodenal course and caliber. --Small bowel: There are mildly dilated loops of small bowel scattered throughout the abdomen to the level of the patient's  ileocecal valve. --Colon: There is moderate distention of the cecum. There is an above transition point at the level of the ascending colon with there is a probable underlying colonic mass. The remaining portions of the patient's colon are completely decompressed aside from the rectum. --Appendix: Normal. Vascular/Lymphatic: Atherosclerotic calcification is present within the non-aneurysmal abdominal aorta, without hemodynamically significant stenosis. --No retroperitoneal lymphadenopathy. --there are few mildly enlarged mesenteric lymph nodes in the right lower quadrant and right mid abdomen. --No pelvic or inguinal lymphadenopathy. Reproductive: Unremarkable Other: No ascites or free air. The abdominal wall is normal. Musculoskeletal. No acute displaced fractures. IMPRESSION: 1. Small and large bowel obstruction that appears to be secondary to the previously demonstrated colonic mass in the ascending colon. 2. There are few mildly enlarged mesenteric lymph nodes in the right lower quadrant and right mid abdomen, concerning for nodal metastatic disease. 3. Status post right nephrectomy. 4. Atrophic left kidney. Aortic Atherosclerosis (ICD10-I70.0). Electronically Signed   By: Constance Holster M.D.   On: 08/27/2020 19:51   DG Abdomen 1 View  Result Date: 08/28/2020 CLINICAL DATA:  Nasogastric tube placement. EXAM: ABDOMEN - 1 VIEW COMPARISON:  CT abdomen and pelvis 08/27/2020 FINDINGS: The tip of the nasogastric tube is near the GE junction. There is gas in the stomach. Gas within bowel loops in the mid abdomen and left lower abdomen. Limited evaluation for free air on the supine image. IMPRESSION: 1. Nasogastric tube tip is near the GE junction. 2. Persistent gas-filled loops of bowel in the abdomen. Findings remain compatible with a bowel obstruction. Electronically Signed   By: Markus Daft M.D.   On: 08/28/2020 07:49   . sodium chloride 100 mL/hr at 08/28/20 0112  . sodium chloride    . sodium chloride     . ceFEPime (MAXIPIME) IV Stopped (08/28/20 0143)      Assessment/Plan ESRD on HD TTS Hx renal cell carcinoma; s/p partial right nephrectomy adrenalectomy 2018 Type 2 diabetes; diabetic neuropathy, diabetic retinopathy. 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  Agree with NG tube for decompression.  She has had a second bowel movement yesterday; since onset of symptoms about 1 week ago.  We will continue to follow with you and await colonoscopy report.  I will get a CEA, and prealbumin tomorrow. She will need a CT of the chest for metastatic work-up. She will need medical clearance prior to any surgical procedure.      [facility] 08/28/2020, 12:16 PM Please see Amion for pager number during day hours 7:00am-4:30pm

## 2020-08-28 NOTE — Consult Note (Addendum)
Referring Provider: Dr. Grandville Silos  Primary Care Physician:  [name], MD Primary Gastroenterologist:  Althia Forts, patient scheduled outpatient appointment with Dr. Loletha Carrow 09/05/2020  Reason for Consultation:  Colon mass with obstruction   HPI: [name] is a 70 y.o. female with a past medical history of anxiety, depression, hypertension, coronary calcifications per CT, DM II, renal cell carcinoma s/p robotic assisted partial right nephrectomy and adrenalectomy 02/2017, ESRD on HD on Tu/Th/Sat, peripheral vascular disease, diabetic neuropathy, retinopathy, asthma and GERD.  She developed constipation for 1 week and then took a stool softener on Friday 2/4 without improvement.  She took 2 stool softeners on Saturday 2/5 which resulted in passing a moderate amount of hard formed brown stool.  She developed nausea with multiple episodes of nonbloody bilious emesis which persisted the next day with periumbilical and right lower quadrant abdominal pain which progressively worsened.  She missed her dialysis session on Saturday 2/5 as she felt too ill to leave her house. She also missed her dialysis session x 2 the prior week due to the winter weather. She presented to [facility] ED  2/6 for further evaluation.  Labs in the ED, showed a Na+ level of 137. K+ 4.9. BUN 71. Cr. 8.79. Lipase 33. AST 20.  ALT 12. T. Bili 1.1. WBC 10.7. Hg 12.8. HCT 38.4. PLT 15.2. Sars Coronavirus 19 negative. An abdominal/pelvic CT scan showed evidence of a small and large bowel obstruction secondary to an ascending colon mass with a few enlarged mesenteric lymph nodes in the RLQ concerning for metastatic process.  A GI consult was requested for further evaluation.  Currently, her nausea and vomiting are well controlled.  A NG tube was placed in the ED, no drainage in the canister at this time.  No further vomiting since arriving to the ED.  She is having some upper abdominal discomfort since the NG tube was  placed.  She denies having any heartburn.  She reports having mild dysphagia for the past 2 months.  She describes having food getting stuck to her upper esophagus which occurs once or twice weekly for the past months, she thinks this occurs when she eats too quickly.  She drinks water and the stuck food passes.  She typically passes a normal formed brown bowel movement daily with the exception of constipation for 1 week as noted above.  No rectal bleeding or melena.  She denies ever having a screening colonoscopy. In review of her Epic records, she presented to the ED 05/19/2020 due to having RLQ abdominal pain. Labs at that time showed a mild leukocytosis and a CTAP showed prominent distension of the cecum and ascending colon concerning for an inflammatory process verses an underlying mass.  She was prescribed Metronidazole 573m po ibd and Ceftin 5059mpo bid x 7 days for diverticulitis with instructions to schedule a GI evaluation for colonoscopy.  However, the patient stated her abdominal pain abated and she was not concerned about the CT findings.  She did not wish to have a colonoscopy.  Mother died in her 8075'secondary to a ruptured colon, further details unknown.  Three maternal aunts had colon disease, she is unsure if they had colon cancer.  She is willing to undergo a colonoscopy at this time to further define her diagnosis, colon cancer is suspected.   She is also under evaluation for an ulcer to her right third toe.  She is scheduled for amputation of her toe next week. Her oral  antibiotic therapy was switched to Linezolid based on recent culture results which was started on 2/3. She also reported being bit by a dog on her left hand 2 weeks ago.  CTAP 08/27/2020: 1. Small and large bowel obstruction that appears to be secondary to the previously demonstrated colonic mass in the ascending colon. 2. There are few mildly enlarged mesenteric lymph nodes in the right lower quadrant and right mid  abdomen, concerning for nodal metastatic disease. 3. Status post right nephrectomy. 4. Atrophic left kidney. Aortic Atherosclerosis   CTAP 05/19/2020: 1. Prominent distension of the cecum and proximal ascending colon with stool. There is a transition to decompressed colon at the level of the mid ascending colon where there is prominent wall thickening and adjacent pericolonic fat stranding. Findings are concerning for an underlying infectious or inflammatory process such as diverticulitis/colitis or epiploic appendagitis. An underlying colonic mass is not excluded. Further evaluation with colonoscopy is recommended following the resolution of patient's acute symptoms. 2. Severe circumferential thickening of the urinary bladder wall with adjacent fat stranding. Correlate with urinalysis for cystitis. 3. Prior right nephrectomy and left adrenalectomy. 4. Cystic lesion within the left adnexa measuring up to 2.6 cm, not appreciably changed from prior study. Aortic Atherosclerosis    Past Medical History:  Diagnosis Date  . Anemia   . Anemia associated with chronic renal failure   . Anemia in chronic kidney disease 09/29/2018  . Arthritis   . Asthma   . Blood transfusion without reported diagnosis    Phreesia 02/27/2020  . Cancer (HCC)    Phreesia 02/27/2020  . Cataract    OD  . Chronic kidney disease    Phreesia 02/27/2020  . Coronary artery calcification seen on CAT scan 02/17/2018   Coronary calcification on CT  . Depression   . Diabetes mellitus without complication (HCC)    Phreesia 02/27/2020  . Diabetic retinopathy of both eyes (HCC)   . Diabetic ulcer of right foot associated with type 2 diabetes mellitus (HCC)   . ESRD (end stage renal disease) on dialysis (HCC)   . Essential hypertension 02/17/2018   Essential hypertension  . GERD (gastroesophageal reflux disease)    pt denies  . HLD (hyperlipidemia)   . Hypertension   . Hypertensive retinopathy    OU  . Left  ventricular dysfunction 04/07/2018   Left ventricle dysfunction  . Microalbuminuria due to type 2 diabetes mellitus (HCC)   . Neuromuscular disorder (HCC)    diabetic neuropathy  . Nonproliferative retinopathy due to secondary diabetes (HCC)   . Nonsustained ventricular tachycardia (HCC) 08/28/2018   Nonsustained ventricular tachycardia  . Pneumonia    hx of   . Renal cell cancer (HCC)   . Renal insufficiency   . Right renal mass 03/10/2017  . Type 2 diabetes, controlled, with neuropathy (HCC) 06/22/2013  . Ulcer of other part of foot 06/22/2013  . 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 (HCC)     Past Surgical History:  Procedure Laterality Date  . AMPUTATION TOE Right 10/29/2019   Procedure: RIGHT 2ND TOE AMPUTATION;  Surgeon: Evans, Brent M, DPM;  Location: [facility];  Service: Podiatry;  Laterality: Right;  . BASCILIC VEIN TRANSPOSITION Left 08/08/2017   Procedure: BASILIC VEIN TRANSPOSITION FIRST STAGE LEFT ARM;  Surgeon: Brabham, Vance W, MD;  Location: [facility] OR;  Service: Vascular;  Laterality: Left;  . BASCILIC VEIN TRANSPOSITION Left 05/14/2018   Procedure: SECOND STAGE BASILIC VEIN TRANSPOSITION LEFT ARM;  Surgeon: Brabham, Vance W, MD;    Location: [facility] OR;  Service: Vascular;  Laterality: Left;  . CATARACT EXTRACTION Left   . EYE SURGERY    . ROBOTIC ADRENALECTOMY Left 03/10/2017   Procedure: XI ROBOTIC ADRENALECTOMY;  Surgeon: Nickie Retort, MD;  Location: [facility];  Service: Urology;  Laterality: Left;  . ROBOTIC ASSITED PARTIAL NEPHRECTOMY Right 03/10/2017   Procedure: XI ROBOTIC ASSITED RADICAL NEPHRECTOMY;  Surgeon: Nickie Retort, MD;  Location: [facility];  Service: Urology;  Laterality: Right;  . TOE AMPUTATION Bilateral    both great toe ,, left foot 2nd toe 1/2    Prior to Admission medications   Medication Sig Start Date End Date Taking? Authorizing Provider  BREO ELLIPTA 100-25 MCG/INH AEPB Inhale 1 puff by mouth once daily Patient taking differently:  Inhale 1 puff into the lungs daily. 08/09/20  Yes [name], MD  Calcium Carbonate Antacid (TUMS PO) Take 1 tablet by mouth 3 (three) times daily with meals.   Yes [provider]  Carboxymethylcellulose Sodium (THERATEARS OP) Place 1 drop into both eyes daily as needed (dry eyes).   Yes [provider]  diphenhydrAMINE (BENADRYL) 25 MG tablet Take 25 mg by mouth daily as needed for allergies or itching.   Yes [provider]  gabapentin (NEURONTIN) 300 MG capsule TAKE 1 CAPSULE BY MOUTH THREE TIMES DAILY. REQUIRES OFFICE VISIT BEFORE ANY FURTHER REFILLS CAN BE GIVEN Patient taking differently: Take 300 mg by mouth 3 (three) times daily as needed (nerve pain). 06/21/20  Yes [name], MD  insulin degludec (TRESIBA FLEXTOUCH) 100 UNIT/ML FlexTouch Pen Inject 0.3 mLs (30 Units total) into the skin daily. Hold if fasting blood sugars are <130. Patient taking differently: Inject 30-50 Units into the skin daily as needed (CBG >130). 02/11/20  Yes Pickard, Cammie Mcgee, MD  linezolid (ZYVOX) 600 MG tablet Take 600 mg by mouth 2 (two) times daily. 08/22/20  Yes [provider]  multivitamin (RENA-VIT) TABS tablet Take 1 tablet by mouth daily.   Yes [provider]  PROAIR HFA 108 (90 Base) MCG/ACT inhaler INHALE 2 PUFFS BY MOUTH EVERY 6 HOURS AS NEEDED FOR WHEEZING FOR SHORTNESS OF BREATH Patient taking differently: Inhale 2 puffs into the lungs every 6 (six) hours as needed for wheezing or shortness of breath. 08/09/20  Yes [name], MD  rosuvastatin (CRESTOR) 20 MG tablet Take 1 tablet (20 mg total) by mouth daily. 11/23/19  Yes [name], MD  SANTYL ointment Apply 1 application topically daily as needed (wound care). 03/29/20  Yes [provider]  Blood Glucose Monitoring Suppl (ONE TOUCH ULTRA 2) w/Device KIT Use to check BS BID-QID Dx:E11.9 05/19/17   [name], MD  doxycycline (VIBRA-TABS) 100 MG tablet Take 1 tablet (100  mg total) by mouth 2 (two) times daily. Patient not taking: No sig reported 08/18/20   Edrick Kins, DPM  Insulin Pen Needle (LIVE BETTER PEN NEEDLES) 31G X 6 MM MISC To use with Tresiba pens daily 02/05/19   [name], MD  Lancets [facility] ULTRASOFT) lancets Use as instructed 05/19/17   [name], MD  [facility] ULTRA test strip USE AS DIRECTED TO MONITOR  FSBS 3 TIMES DAILY 01/27/20   Annie Main, FNP    Current Facility-Administered Medications  Medication Dose Route Frequency Provider Last Rate Last Admin  . 0.9 %  sodium chloride infusion   Intravenous Continuous Mansy, Jan A, MD 100 mL/hr at 08/28/20 0112 New Bag at 08/28/20  0112  . 0.9 %  sodium chloride infusion  100 mL Intravenous PRN Dwana Melena, MD      . 0.9 %  sodium chloride infusion  100 mL Intravenous PRN Dwana Melena, MD      . acetaminophen (TYLENOL) tablet 650 mg  650 mg Oral Q6H PRN Mansy, Jan A, MD       Or  . acetaminophen (TYLENOL) suppository 650 mg  650 mg Rectal Q6H PRN Mansy, Jan A, MD      . albuterol (VENTOLIN HFA) 108 (90 Base) MCG/ACT inhaler 2 puff  2 puff Inhalation Q4H PRN Mansy, Jan A, MD      . alteplase (CATHFLO ACTIVASE) injection 2 mg  2 mg Intracatheter Once PRN Dwana Melena, MD      . Bevacizumab (AVASTIN) SOLN 1.25 mg  1.25 mg Intravitreal  [name], MD   1.25 mg at 05/12/18 1319  . Bevacizumab (AVASTIN) SOLN 1.25 mg  1.25 mg Intravitreal  [name], MD   1.25 mg at 06/13/18 0055  . Bevacizumab (AVASTIN) SOLN 1.25 mg  1.25 mg Intravitreal  [name], MD   1.25 mg at 07/10/18 1308  . Bevacizumab (AVASTIN) SOLN 1.25 mg  1.25 mg Intravitreal  [name], MD   1.25 mg at 08/21/18 1312  . Bevacizumab (AVASTIN) SOLN 1.25 mg  1.25 mg Intravitreal  [name], MD   1.25 mg at 10/02/18 1510  . ceFEPIme (MAXIPIME) 1 g in sodium chloride 0.9 % 100 mL IVPB  1 g Intravenous Q24H Efraim Kaufmann, RPH 200 mL/hr at 08/28/20 0113 1 g at 08/28/20 0113  . Chlorhexidine Gluconate  Cloth 2 % PADS 6 each  6 each Topical Q0600 Dwana Melena, MD      . collagenase [facility]) ointment 1 application  1 application Topical [id number] Mansy, Jan A, MD      . diphenhydrAMINE (BENADRYL) capsule 25 mg  25 mg Oral Daily PRN Mansy, Jan A, MD      . fluticasone furoate-vilanterol (BREO ELLIPTA) 100-25 MCG/INH 1 puff  1 puff Inhalation Daily Mansy, Jan A, MD      . gabapentin (NEURONTIN) capsule 300 mg  300 mg Oral TID Mansy, Jan A, MD   300 mg at 08/27/20 2231  . heparin injection 1,000 Units  1,000 Units Dialysis PRN Dwana Melena, MD      . heparin injection 5,000 Units  5,000 Units Dialysis PRN Dwana Melena, MD      . heparin injection 5,000 Units  5,000 Units Subcutaneous Q8H Mansy, Arvella Merles, MD   5,000 Units at 08/28/20 0515  . insulin aspart (novoLOG) injection 0-15 Units  0-15 Units Subcutaneous TID WC Mansy, Jan A, MD      . insulin glargine (LANTUS) injection 30 Units  30 Units Subcutaneous QHS Mansy, Arvella Merles, MD   30 Units at 08/27/20 2359  . iohexol (OMNIPAQUE) 300 MG/ML solution 100 mL  100 mL Intravenous Once PRN Truddie Hidden, MD      . lidocaine (PF) (XYLOCAINE) 1 % injection 5 mL  5 mL Intradermal PRN Dwana Melena, MD      . lidocaine-prilocaine (EMLA) cream 1 application  1 application Topical PRN Dwana Melena, MD      . magnesium hydroxide (MILK OF MAGNESIA) suspension 30 mL  30 mL Oral Daily PRN Mansy, Jan A, MD      . morphine 2 MG/ML injection 2 mg  2 mg Intravenous Q2H PRN  Mansy, Jan A, MD      . multivitamin (RENA-VIT) tablet 1 tablet  1 tablet Oral QHS Mansy, Jan A, MD   1 tablet at 08/28/20 0000  . ondansetron (ZOFRAN) tablet 4 mg  4 mg Oral Q6H PRN Mansy, Jan A, MD       Or  . ondansetron (ZOFRAN) injection 4 mg  4 mg Intravenous Q6H PRN Mansy, Jan A, MD      . pentafluoroprop-tetrafluoroeth (GEBAUERS) aerosol 1 application  1 application Topical PRN Lin, James W, MD      . polyvinyl alcohol (LIQUIFILM TEARS) 1.4 % ophthalmic solution 1 drop  1 drop Both Eyes Daily PRN  Mansy, Jan A, MD      . rosuvastatin (CRESTOR) tablet 20 mg  20 mg Oral QHS Mansy, Jan A, MD   20 mg at 08/28/20 0000  . traZODone (DESYREL) tablet 25 mg  25 mg Oral QHS PRN Mansy, Jan A, MD       Current Outpatient Medications  Medication Sig Dispense Refill  . BREO ELLIPTA 100-25 MCG/INH AEPB Inhale 1 puff by mouth once daily (Patient taking differently: Inhale 1 puff into the lungs daily.) 180 each 0  . Calcium Carbonate Antacid (TUMS PO) Take 1 tablet by mouth 3 (three) times daily with meals.    . Carboxymethylcellulose Sodium (THERATEARS OP) Place 1 drop into both eyes daily as needed (dry eyes).    . diphenhydrAMINE (BENADRYL) 25 MG tablet Take 25 mg by mouth daily as needed for allergies or itching.    . gabapentin (NEURONTIN) 300 MG capsule TAKE 1 CAPSULE BY MOUTH THREE TIMES DAILY. REQUIRES OFFICE VISIT BEFORE ANY FURTHER REFILLS CAN BE GIVEN (Patient taking differently: Take 300 mg by mouth 3 (three) times daily as needed (nerve pain).) 90 capsule 0  . insulin degludec (TRESIBA FLEXTOUCH) 100 UNIT/ML FlexTouch Pen Inject 0.3 mLs (30 Units total) into the skin daily. Hold if fasting blood sugars are <130. (Patient taking differently: Inject 30-50 Units into the skin daily as needed (CBG >130).) 45 mL 0  . linezolid (ZYVOX) 600 MG tablet Take 600 mg by mouth 2 (two) times daily.    . multivitamin (RENA-VIT) TABS tablet Take 1 tablet by mouth daily.    . PROAIR HFA 108 (90 Base) MCG/ACT inhaler INHALE 2 PUFFS BY MOUTH EVERY 6 HOURS AS NEEDED FOR WHEEZING FOR SHORTNESS OF BREATH (Patient taking differently: Inhale 2 puffs into the lungs every 6 (six) hours as needed for wheezing or shortness of breath.) 9 g 0  . rosuvastatin (CRESTOR) 20 MG tablet Take 1 tablet (20 mg total) by mouth daily. 90 tablet 3  . SANTYL ointment Apply 1 application topically daily as needed (wound care).    . Blood Glucose Monitoring Suppl (ONE TOUCH ULTRA 2) w/Device KIT Use to check BS BID-QID Dx:E11.9 1 each 1  .  doxycycline (VIBRA-TABS) 100 MG tablet Take 1 tablet (100 mg total) by mouth 2 (two) times daily. (Patient not taking: No sig reported) 20 tablet 0  . Insulin Pen Needle (LIVE BETTER PEN NEEDLES) 31G X 6 MM MISC To use with Tresiba pens daily 100 each 3  . Lancets (ONETOUCH ULTRASOFT) lancets Use as instructed 300 each 3  . ONETOUCH ULTRA test strip USE AS DIRECTED TO MONITOR  FSBS 3 TIMES DAILY 300 strip 3    Allergies as of 08/27/2020 - Review Complete 08/27/2020  Allergen Reaction Noted  . Latex Itching 08/27/2020  . Penicillins Other (See Comments) 11/28/2012  .   Adhesive [tape] Rash 07/30/2017    Family History  Problem Relation Age of Onset  . Heart disease Mother   . Diabetes Sister     Social History   Socioeconomic History  . Marital status: Single    Spouse name: Not on file  . Number of children: 2  . Years of education: 12  . Highest education level: Not on file  Occupational History  . Not on file  Tobacco Use  . Smoking status: Never Smoker  . Smokeless tobacco: Never Used  Vaping Use  . Vaping Use: Never used  Substance and Sexual Activity  . Alcohol use: Yes    Alcohol/week: 0.0 standard drinks    Comment: occ  . Drug use: No  . Sexual activity: Not Currently    Partners: Male  Other Topics Concern  . Not on file  Social History Narrative  . Not on file   Social Determinants of Health   Financial Resource Strain: Low Risk   . Difficulty of Paying Living Expenses: Not very hard  Food Insecurity: Not on file  Transportation Needs: Not on file  Physical Activity: Not on file  Stress: Not on file  Social Connections: Not on file  Intimate Partner Violence: Not on file    Review of Systems: See HPI, all other systems reviewed and are negative.   Physical Exam: Vital signs in last 24 hours: Temp:  [98 F (36.7 C)-98.1 F (36.7 C)] 98.1 F (36.7 C) (02/06 1619) Pulse Rate:  [67-107] 68 (02/07 0630) Resp:  [13-27] 13 (02/07 0630) BP:  (101-158)/(55-105) 103/60 (02/07 0630) SpO2:  [92 %-100 %] 96 % (02/07 0630) Weight:  [88.5 kg] 88.5 kg (02/06 1331)   General:  Alert 69-year-old female fatigued appearing but in no acute distress. Head:  Normocephalic and atraumatic. Eyes:  No scleral icterus. Conjunctiva pink. Ears:  Normal auditory acuity. Nose:  No deformity, discharge or lesions. Mouth: Poor dentition.  No ulcers or lesions.  Neck:  Supple. No lymphadenopathy or thyromegaly.  Lungs: Breath sounds clear throughout.  Heart: Regular rate and rhythm, no murmurs. Abdomen: Soft, superficial firm area from the right mid abdomen radiates across to the left mid abdomen, moderate area of ecchymosis left of the umbilicus.  Right mid abdominal tenderness radiates to the right lower quadrant without rebound or guarding.  Very few bowel sounds per auscultation.  NG tube intact, suction is off. Rectal: Deferred. Musculoskeletal:  Symmetrical without gross deformities.  Pulses:  Normal pulses noted. Extremities:  Without clubbing or edema.  Left upper extremity fistula with positive bruit and thrill. Neurologic:  Alert and  oriented x4. No focal deficits.  Skin: Numerous abrasions and small ulcerations to the upper and lower extremities. Psych:  Alert and cooperative. Normal mood and affect.  Intake/Output from previous day: 02/06 0701 - 02/07 0700 In: 394.8 [IV Piggyback:394.8] Out: -  Intake/Output this shift: No intake/output data recorded.  Lab Results: Recent Labs    08/27/20 1350 08/28/20 0424  WBC 10.7* 9.4  HGB 12.8 11.3*  HCT 38.4 34.5*  PLT 204 160   BMET Recent Labs    08/27/20 1350 08/28/20 0424  NA 137 137  K 4.9 5.9*  CL 93* 93*  CO2 22 24  GLUCOSE 260* 143*  BUN 71* 76*  CREATININE 8.79* 8.71*  CALCIUM 8.2* 7.7*   LFT Recent Labs    08/28/20 0424  PROT 6.3*  ALBUMIN 2.9*  AST 17  ALT 11  ALKPHOS 59  BILITOT 0.9     PT/INR No results for input(s): LABPROT, INR in the last 72  hours. Hepatitis Panel No results for input(s): HEPBSAG, HCVAB, HEPAIGM, HEPBIGM in the last 72 hours.    Studies/Results: CT ABDOMEN PELVIS WO CONTRAST  Result Date: 08/27/2020 CLINICAL DATA:  Nephrectomy for right-sided renal cell carcinoma. Concern for bowel obstruction. EXAM: CT ABDOMEN AND PELVIS WITHOUT CONTRAST TECHNIQUE: Multidetector CT imaging of the abdomen and pelvis was performed following the standard protocol without IV contrast. COMPARISON:  June 28, 2020 FINDINGS: Lower chest: The lung bases are clear. The heart size is normal. Hepatobiliary: The liver is normal. Normal gallbladder.There is no biliary ductal dilation. Pancreas: Normal contours without ductal dilatation. No peripancreatic fluid collection. Spleen: Unremarkable. Adrenals/Urinary Tract: --Adrenal glands: Unremarkable. --Right kidney/ureter: The right kidney is surgically absent. --Left kidney/ureter: The left kidney appears atrophic. --Urinary bladder: Unremarkable. Stomach/Bowel: --Stomach/Duodenum: No hiatal hernia or other gastric abnormality. Normal duodenal course and caliber. --Small bowel: There are mildly dilated loops of small bowel scattered throughout the abdomen to the level of the patient's ileocecal valve. --Colon: There is moderate distention of the cecum. There is an above transition point at the level of the ascending colon with there is a probable underlying colonic mass. The remaining portions of the patient's colon are completely decompressed aside from the rectum. --Appendix: Normal. Vascular/Lymphatic: Atherosclerotic calcification is present within the non-aneurysmal abdominal aorta, without hemodynamically significant stenosis. --No retroperitoneal lymphadenopathy. --there are few mildly enlarged mesenteric lymph nodes in the right lower quadrant and right mid abdomen. --No pelvic or inguinal lymphadenopathy. Reproductive: Unremarkable Other: No ascites or free air. The abdominal wall is normal.  Musculoskeletal. No acute displaced fractures. IMPRESSION: 1. Small and large bowel obstruction that appears to be secondary to the previously demonstrated colonic mass in the ascending colon. 2. There are few mildly enlarged mesenteric lymph nodes in the right lower quadrant and right mid abdomen, concerning for nodal metastatic disease. 3. Status post right nephrectomy. 4. Atrophic left kidney. Aortic Atherosclerosis (ICD10-I70.0). Electronically Signed   By: Christopher  Green M.D.   On: 08/27/2020 19:51   DG Abdomen 1 View  Result Date: 08/28/2020 CLINICAL DATA:  Nasogastric tube placement. EXAM: ABDOMEN - 1 VIEW COMPARISON:  CT abdomen and pelvis 08/27/2020 FINDINGS: The tip of the nasogastric tube is near the GE junction. There is gas in the stomach. Gas within bowel loops in the mid abdomen and left lower abdomen. Limited evaluation for free air on the supine image. IMPRESSION: 1. Nasogastric tube tip is near the GE junction. 2. Persistent gas-filled loops of bowel in the abdomen. Findings remain compatible with a bowel obstruction. Electronically Signed   By: Adam  Henn M.D.   On: 08/28/2020 07:49    IMPRESSION/PLAN:  1.  69-year-old female admitted to the hospital 08/27/2020 with N/V and RLQ/periumbilical abdominal pain.  CTAP identified a small bowel and colon obstruction secondary ascending colon mass, most likely malignant mass. NGT placed.  -NPO -Surgical consult  -Colonoscopy timing to be verified by Dr. Koby Hartfield. She will require water enemas for bowel prep in setting of small bowel and colon obstruction.  -IV fluids per the medical team and nephrology  -Pain management per the hospitalist -Zofran 4 mg IV every 6 hours as needed  2.  History of renal cell carcinoma s/p partial right nephrectomy 2018 with ESRD on HD  -Patient will be transferred to [facility] for admission and dialysis  3. GERD. Dysphagia.  -Pantoprazole 40 mg IV Q day - consider EGD pending her clinical    course.  4. Chronic normocytic anemia, secondary to ESRD and suspect colon malignancy   5. DM II  6. Right 3rd toe ulcer/cellulitis on Vanco and Cefepime   Colleen M Kennedy-Smith  08/28/2020, 9:00 AM  ________________________________________________________________________  [facility] MD note:  I personally examined the patient, reviewed the data and agree with the assessment and plan described above.  She has an obstructing mass in her right colon. I discussed her case with surgery and we will attempt to evaluate her colon as best that we can tomorrow.  She has bowel sounds, passed a normal BM yesterday before coming to ER and is passing a small amount of flatus today-- she may tolerate an oral prep and that would definitely be better than any potential rectal prep. I discussed prep with her RN who wrote it down to pass on to later RNs.  The plan is to attempt a normal split dose Movi prep via her NG tube that will be clamped after the first prep dose tonight. If she responds to the first prep with diarrhea and does not vomit then she will repeat with the second dose of prep at 3am tomorrow for a colonoscopy later in the morning with Dr. Armbruster. If she does not have any stool output by Midnight tonight OR if she vomits any of the first dose of prep then they will not attempt a second dose and instead she will complete 2 tap water enemas at 9am tomorrow morning.     Kattleya Kuhnert, MD [facility] Pager [phone]   

## 2020-08-28 NOTE — Consult Note (Signed)
[facility] Nurse Consult Note: Reason for Consult: Asked to clarify order for collagenase with no location noted in order for application instruction.  The only wounds patient has are to the bilateral 3rd digits of her feet.  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  Dr. Amalia Hailey, DPM has seen patient this evening and ordered wound care using betadine and a light dressing.  Noted is his plan for amputation of these affected digits if patient stable during this admission. Wound type:infectious Pressure Injury POA: N/A  Dr. Amalia Hailey has provided topical care orders for the 3rd digits.  Collagenase order is discontinued to avoid confusion for Bedside Nursing.  [facility] nursing team will not follow, but will remain available to this patient, the nursing and medical teams.  Please re-consult if needed. Thanks, Maudie Flakes, MSN, RN, [facility], [facility]  Pager# [phone]

## 2020-08-28 NOTE — Progress Notes (Addendum)
PROGRESS NOTE    [name]  ZLD:[id number] DOB: Apr 06, 1951 DOA: 08/27/2020 PCP: [name], MD    Chief Complaint  Patient presents with  . Animal Bite  . Abdominal Pain    Brief Narrative:  Patient 70 year old female history of asthma, anemia, renal cell carcinoma status post nephrectomy, type 2 diabetes mellitus, end-stage renal disease on hemodialysis Tuesday Thursday Saturday, peripheral vascular disease, hypertension, hyperlipidemia, GERD, diabetic retinopathy as well as neuropathy presented to the ED with acute abdominal pain, intractable nausea and vomiting to liquids and solids.  Patient noted to have some constipation a week prior to admission.  Patient noted to have missed hemodialysis on Tuesday and Saturday prior to admission.  Patient also noted to have been scheduled for amputation of the right third toe per podiatry.  Patient seen in the ED work-up with CT abdomen and pelvis revealed small and large bowel obstruction secondary to previously demonstrated colonic mass in the ascending colon. NG tube placed.  Nephrology consulted.  GI consulted as well as general surgery.  General surgery consultation pending.  Patient awaiting transfer to [facility] for hemodialysis.   Assessment & Plan:   Principal Problem:   Bowel obstruction (HCC) Active Problems:   Type 2 diabetes, controlled, with neuropathy (HCC)   Hyperlipidemia   Nonproliferative retinopathy due to secondary diabetes (HCC)   Anemia in chronic kidney disease   End stage renal disease (HCC)   Polyneuropathy, unspecified   Unspecified asthma, uncomplicated   Cellulitis in diabetic foot ([place])  #1 small and large bowel obstruction secondary to right ascending colonic mass Patient presented with nausea emesis abdominal pain.  NG tube placed.  CT abdomen and pelvis concerning for small and large bowel obstruction secondary to right ascending colonic mass.  Patient with no flatus or bowel movement.   Still with right lower quadrant abdominal pain.  Patient currently NPO.  GI consultation pending.  General surgery, Dr. Rosendo Gros notified of admission.  General surgery consultation pending for today.  It is noted that patient now agreeable to colonoscopy which she recently refused.  Continue bowel rest, gentle hydration.  GI and general surgery consultation pending.  Follow.  2.  End-stage renal disease on hemodialysis Tuesday Thursday Saturday Nephrology consulted patient seen in consultation by Dr. Augustin Coupe and patient for hemodialysis today.  3.  Type 2 diabetes mellitus with diabetic retinopathy and neuropathy Hemoglobin A1c 7.8 (08/28/2020).  CBG 98.  Sliding scale insulin.  Decrease Lantus to 15 units daily as patient currently n.p.o.  Sliding scale insulin.  Change CBGs to every 4 hours.  Follow.  4.  Right third toe nonpurulent severe cellulitis and ulcer Noted per patient to be followed by podiatry with plans for possible amputation this coming week.  Continue empiric IV vancomycin and cefepime.  Will need to inform podiatry of admission.  Secure chat sent to Dr. Amalia Hailey.  5.  Hyperlipidemia Resume statin when no longer n.p.o. and small bowel obstruction resolved.  6.  Asthma without exacerbation Continue home regimen Breo Ellipta and albuterol inhaler.  7.  Hyperkalemia Likely secondary to missed HD.  Patient for hemodialysis today.  Per nephrology.   DVT prophylaxis: Heparin Code Status: Full Family Communication: Updated patient.  No family at bedside. Disposition:   Status is: Inpatient    Dispo: The patient is from: Home              Anticipated d/c is to: Home  Anticipated d/c date is: 4 to 5 days              Patient currently awaiting to be transferred to [facility] for hemodialysis.  Patient with small and large bowel obstruction pending further evaluation.  Not stable for discharge.   Difficult to place patient : No       Consultants:   General  surgery pending  Gastroenterology: Dr. Dr. Loletha Carrow  Nephrology: Dr. Augustin Coupe 08/27/2020  Procedures:   CT abdomen and pelvis 08/27/2022  Abdominal films 08/28/2020   Antimicrobials:   IV cefepime 08/27/2020>>>>  IV vancomycin 08/27/2020 >>>>>   Subjective: ngt in place. No SOB, no CP, no nausea or emesis. Some abd pain. No flatus, no BM today per patient.  Objective: Vitals:   08/28/20 1258 08/28/20 1300 08/28/20 1330 08/28/20 1400  BP: (!) 91/57 (!) 90/43 (!) 100/54 100/65  Pulse: 77 78  85  Resp:    15  Temp:      TempSrc:      SpO2:    96%  Weight:      Height:        Intake/Output Summary (Last 24 hours) at 08/28/2020 1607 Last data filed at 08/28/2020 0143 Gross per 24 hour  Intake 494.81 ml  Output -  Net 494.81 ml   Filed Weights   08/27/20 1331 08/28/20 1220  Weight: 88.5 kg 89.1 kg    Examination:  General exam: Appears calm and comfortable. NGT. Respiratory system: Clear to auscultation.  No wheezes, no crackles, no rhonchi.  Respiratory effort normal. Cardiovascular system: S1 & S2 heard, RRR. No JVD, murmurs, rubs, gallops or clicks. No pedal edema. Gastrointestinal system: Abdomen is nondistended, soft and TTP in RLQ.  No rebound.  No guarding. Central nervous system: Alert and oriented. No focal neurological deficits. Extremities: Symmetric 5 x 5 power. Skin: No rashes, lesions or ulcers Psychiatry: Judgement and insight appear normal. Mood & affect appropriate.     Data Reviewed: I have personally reviewed following labs and imaging studies  CBC: Recent Labs  Lab 08/27/20 1350 08/28/20 0424  WBC 10.7* 9.4  HGB 12.8 11.3*  HCT 38.4 34.5*  MCV 94.1 95.6  PLT 204 944    Basic Metabolic Panel: Recent Labs  Lab 08/27/20 1350 08/28/20 0424  NA 137 137  K 4.9 5.9*  CL 93* 93*  CO2 22 24  GLUCOSE 260* 143*  BUN 71* 76*  CREATININE 8.79* 8.71*  CALCIUM 8.2* 7.7*    GFR: Estimated Creatinine Clearance: 7 mL/min (A) (by C-G formula based on  SCr of 8.71 mg/dL (H)).  Liver Function Tests: Recent Labs  Lab 08/27/20 1350 08/28/20 0424  AST 20 17  ALT 12 11  ALKPHOS 72 59  BILITOT 1.1 0.9  PROT 7.4 6.3*  ALBUMIN 3.5 2.9*    CBG: Recent Labs  Lab 08/28/20 0751  GLUCAP 98     Recent Results (from the past 240 hour(s))  SARS Coronavirus 2 by RT PCR (hospital order, performed in [facility] hospital lab) Nasopharyngeal Nasopharyngeal Swab     Status: None   Collection Time: 08/27/20  9:46 PM   Specimen: Nasopharyngeal Swab  Result Value Ref Range Status   SARS Coronavirus 2 NEGATIVE NEGATIVE Final    Comment: (NOTE) SARS-CoV-2 target nucleic acids are NOT DETECTED.  The SARS-CoV-2 RNA is generally detectable in upper and lower respiratory specimens during the acute phase of infection. The lowest concentration of SARS-CoV-2 viral copies this assay can detect  is 250 copies / mL. A negative result does not preclude SARS-CoV-2 infection and should not be used as the sole basis for treatment or other patient management decisions.  A negative result may occur with improper specimen collection / handling, submission of specimen other than nasopharyngeal swab, presence of viral mutation(s) within the areas targeted by this assay, and inadequate number of viral copies (<250 copies / mL). A negative result must be combined with clinical observations, patient history, and epidemiological information.  Fact Sheet for Patients:   StrictlyIdeas.no  Fact Sheet for Healthcare Providers: BankingDealers.co.za  This test is not yet approved or  cleared by the [place] FDA and has been authorized for detection and/or diagnosis of SARS-CoV-2 by FDA under an Emergency Use Authorization (EUA).  This EUA will remain in effect (meaning this test can be used) for the duration of the COVID-19 declaration under Section 564(b)(1) of the Act, 21 U.S.C. section 360bbb-3(b)(1), unless the  authorization is terminated or revoked sooner.  Performed at [facility], [place] [address], [place], [place] [address]          Radiology Studies: CT ABDOMEN PELVIS WO CONTRAST  Result Date: 08/27/2020 CLINICAL DATA:  Nephrectomy for right-sided renal cell carcinoma. Concern for bowel obstruction. EXAM: CT ABDOMEN AND PELVIS WITHOUT CONTRAST TECHNIQUE: Multidetector CT imaging of the abdomen and pelvis was performed following the standard protocol without IV contrast. COMPARISON:  June 28, 2020 FINDINGS: Lower chest: The lung bases are clear. The heart size is normal. Hepatobiliary: The liver is normal. Normal gallbladder.There is no biliary ductal dilation. Pancreas: Normal contours without ductal dilatation. No peripancreatic fluid collection. Spleen: Unremarkable. Adrenals/Urinary Tract: --Adrenal glands: Unremarkable. --Right kidney/ureter: The right kidney is surgically absent. --Left kidney/ureter: The left kidney appears atrophic. --Urinary bladder: Unremarkable. Stomach/Bowel: --Stomach/Duodenum: No hiatal hernia or other gastric abnormality. Normal duodenal course and caliber. --Small bowel: There are mildly dilated loops of small bowel scattered throughout the abdomen to the level of the patient's ileocecal valve. --Colon: There is moderate distention of the cecum. There is an above transition point at the level of the ascending colon with there is a probable underlying colonic mass. The remaining portions of the patient's colon are completely decompressed aside from the rectum. --Appendix: Normal. Vascular/Lymphatic: Atherosclerotic calcification is present within the non-aneurysmal abdominal aorta, without hemodynamically significant stenosis. --No retroperitoneal lymphadenopathy. --there are few mildly enlarged mesenteric lymph nodes in the right lower quadrant and right mid abdomen. --No pelvic or inguinal lymphadenopathy. Reproductive: Unremarkable Other: No ascites  or free air. The abdominal wall is normal. Musculoskeletal. No acute displaced fractures. IMPRESSION: 1. Small and large bowel obstruction that appears to be secondary to the previously demonstrated colonic mass in the ascending colon. 2. There are few mildly enlarged mesenteric lymph nodes in the right lower quadrant and right mid abdomen, concerning for nodal metastatic disease. 3. Status post right nephrectomy. 4. Atrophic left kidney. Aortic Atherosclerosis (ICD10-I70.0). Electronically Signed   By: Constance Holster M.D.   On: 08/27/2020 19:51   DG Abdomen 1 View  Result Date: 08/28/2020 CLINICAL DATA:  Nasogastric tube placement. EXAM: ABDOMEN - 1 VIEW COMPARISON:  CT abdomen and pelvis 08/27/2020 FINDINGS: The tip of the nasogastric tube is near the GE junction. There is gas in the stomach. Gas within bowel loops in the mid abdomen and left lower abdomen. Limited evaluation for free air on the supine image. IMPRESSION: 1. Nasogastric tube tip is near the GE junction. 2. Persistent gas-filled loops of bowel  in the abdomen. Findings remain compatible with a bowel obstruction. Electronically Signed   By: Markus Daft M.D.   On: 08/28/2020 07:49        Scheduled Meds: . Chlorhexidine Gluconate Cloth  6 each Topical Q0600  . collagenase  1 application Topical D3958  . fluticasone furoate-vilanterol  1 puff Inhalation Daily  . gabapentin  300 mg Oral TID  . heparin injection (subcutaneous)  5,000 Units Subcutaneous Q8H  . insulin aspart  0-15 Units Subcutaneous Q4H  . insulin glargine  15 Units Subcutaneous QHS  . midodrine      . midodrine  10 mg Oral Once in dialysis  . multivitamin  1 tablet Oral QHS  . pantoprazole (PROTONIX) IV  40 mg Intravenous Q24H  . peg 3350 powder  0.5 kit Oral Once   And  . [START ON 08/29/2020] peg 3350 powder  0.5 kit Oral Once   Continuous Infusions: . sodium chloride 100 mL/hr at 08/28/20 0112  . ceFEPime (MAXIPIME) IV Stopped (08/28/20 0143)  . vancomycin        LOS: 1 day    Time spent: 40 minutes    Irine Seal, MD [facility]   To contact the attending provider between 7A-7P or the covering provider during after hours 7P-7A, please log into the web site [email or web address] and access using universal [place] password for that web site. If you do not have the password, please call the hospital operator.  08/28/2020, 4:07 PM

## 2020-08-28 NOTE — H&P (View-Only) (Signed)
Referring Provider: Dr. Grandville Silos  Primary Care Physician:  [name], MD Primary Gastroenterologist:  Althia Forts, patient scheduled outpatient appointment with Dr. Loletha Carrow 09/05/2020  Reason for Consultation:  Colon mass with obstruction   HPI: [name] is a 70 y.o. female with a past medical history of anxiety, depression, hypertension, coronary calcifications per CT, DM II, renal cell carcinoma s/p robotic assisted partial right nephrectomy and adrenalectomy 02/2017, ESRD on HD on Tu/Th/Sat, peripheral vascular disease, diabetic neuropathy, retinopathy, asthma and GERD.  She developed constipation for 1 week and then took a stool softener on Friday 2/4 without improvement.  She took 2 stool softeners on Saturday 2/5 which resulted in passing a moderate amount of hard formed brown stool.  She developed nausea with multiple episodes of nonbloody bilious emesis which persisted the next day with periumbilical and right lower quadrant abdominal pain which progressively worsened.  She missed her dialysis session on Saturday 2/5 as she felt too ill to leave her house. She also missed her dialysis session x 2 the prior week due to the winter weather. She presented to [facility] [place] ED  2/6 for further evaluation.  Labs in the ED, showed a Na+ level of 137. K+ 4.9. BUN 71. Cr. 8.79. Lipase 33. AST 20.  ALT 12. T. Bili 1.1. WBC 10.7. Hg 12.8. HCT 38.4. PLT 15.2. Sars Coronavirus 19 negative. An abdominal/pelvic CT scan showed evidence of a small and large bowel obstruction secondary to an ascending colon mass with a few enlarged mesenteric lymph nodes in the RLQ concerning for metastatic process.  A GI consult was requested for further evaluation.  Currently, her nausea and vomiting are well controlled.  A NG tube was placed in the ED, no drainage in the canister at this time.  No further vomiting since arriving to the ED.  She is having some upper abdominal discomfort since the NG tube was  placed.  She denies having any heartburn.  She reports having mild dysphagia for the past 2 months.  She describes having food getting stuck to her upper esophagus which occurs once or twice weekly for the past months, she thinks this occurs when she eats too quickly.  She drinks water and the stuck food passes.  She typically passes a normal formed brown bowel movement daily with the exception of constipation for 1 week as noted above.  No rectal bleeding or melena.  She denies ever having a screening colonoscopy. In review of her Epic records, she presented to the ED 05/19/2020 due to having RLQ abdominal pain. Labs at that time showed a mild leukocytosis and a CTAP showed prominent distension of the cecum and ascending colon concerning for an inflammatory process verses an underlying mass.  She was prescribed Metronidazole 566m po ibd and Ceftin 5060mpo bid x 7 days for diverticulitis with instructions to schedule a GI evaluation for colonoscopy.  However, the patient stated her abdominal pain abated and she was not concerned about the CT findings.  She did not wish to have a colonoscopy.  Mother died in her [id number]'secondary to a ruptured colon, further details unknown.  Three maternal aunts had colon disease, she is unsure if they had colon cancer.  She is willing to undergo a colonoscopy at this time to further define her diagnosis, colon cancer is suspected.   She is also under evaluation for an ulcer to her right third toe.  She is scheduled for amputation of her toe next week. Her oral  antibiotic therapy was switched to Linezolid based on recent culture results which was started on 2/3. She also reported being bit by a dog on her left hand 2 weeks ago.  CTAP 08/27/2020: 1. Small and large bowel obstruction that appears to be secondary to the previously demonstrated colonic mass in the ascending colon. 2. There are few mildly enlarged mesenteric lymph nodes in the right lower quadrant and right mid  abdomen, concerning for nodal metastatic disease. 3. Status post right nephrectomy. 4. Atrophic left kidney. Aortic Atherosclerosis   CTAP 05/19/2020: 1. Prominent distension of the cecum and proximal ascending colon with stool. There is a transition to decompressed colon at the level of the mid ascending colon where there is prominent wall thickening and adjacent pericolonic fat stranding. Findings are concerning for an underlying infectious or inflammatory process such as diverticulitis/colitis or epiploic appendagitis. An underlying colonic mass is not excluded. Further evaluation with colonoscopy is recommended following the resolution of patient's acute symptoms. 2. Severe circumferential thickening of the urinary bladder wall with adjacent fat stranding. Correlate with urinalysis for cystitis. 3. Prior right nephrectomy and left adrenalectomy. 4. Cystic lesion within the left adnexa measuring up to 2.6 cm, not appreciably changed from prior study. Aortic Atherosclerosis    Past Medical History:  Diagnosis Date  . Anemia   . Anemia associated with chronic renal failure   . Anemia in chronic kidney disease 09/29/2018  . Arthritis   . Asthma   . Blood transfusion without reported diagnosis    Phreesia 02/27/2020  . Cancer ([place])    Phreesia 02/27/2020  . Cataract    OD  . Chronic kidney disease    Phreesia 02/27/2020  . Coronary artery calcification seen on CAT scan 02/17/2018   Coronary calcification on CT  . Depression   . Diabetes mellitus without complication ([place])    Phreesia 02/27/2020  . Diabetic retinopathy of both eyes ([place])   . Diabetic ulcer of right foot associated with type 2 diabetes mellitus ([place])   . ESRD (end stage renal disease) on dialysis ([place])   . Essential hypertension 02/17/2018   Essential hypertension  . GERD (gastroesophageal reflux disease)    pt denies  . HLD (hyperlipidemia)   . Hypertension   . Hypertensive retinopathy    OU  . Left  ventricular dysfunction 04/07/2018   Left ventricle dysfunction  . Microalbuminuria due to type 2 diabetes mellitus ([place])   . Neuromuscular disorder ([place])    diabetic neuropathy  . Nonproliferative retinopathy due to secondary diabetes ([place])   . Nonsustained ventricular tachycardia ([place]) 08/28/2018   Nonsustained ventricular tachycardia  . Pneumonia    hx of   . Renal cell cancer ([place])   . Renal insufficiency   . Right renal mass 03/10/2017  . Type 2 diabetes, controlled, with neuropathy ([place]) 06/22/2013  . Ulcer of other part of foot 06/22/2013  . 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 [facility])     Past Surgical History:  Procedure Laterality Date  . AMPUTATION TOE Right 10/29/2019   Procedure: RIGHT 2ND TOE AMPUTATION;  Surgeon: Edrick Kins, DPM;  Location: [facility];  Service: Podiatry;  Laterality: Right;  . BASCILIC VEIN TRANSPOSITION Left 08/08/2017   Procedure: BASILIC VEIN TRANSPOSITION FIRST STAGE LEFT ARM;  Surgeon: Serafina Mitchell, MD;  Location: [place];  Service: Vascular;  Laterality: Left;  . BASCILIC VEIN TRANSPOSITION Left 05/14/2018   Procedure: SECOND STAGE BASILIC VEIN TRANSPOSITION LEFT ARM;  Surgeon: Serafina Mitchell, MD;  Location: [facility];  Service: Vascular;  Laterality: Left;  . CATARACT EXTRACTION Left   . EYE SURGERY    . ROBOTIC ADRENALECTOMY Left 03/10/2017   Procedure: XI ROBOTIC ADRENALECTOMY;  Surgeon: Nickie Retort, MD;  Location: [facility];  Service: Urology;  Laterality: Left;  . ROBOTIC ASSITED PARTIAL NEPHRECTOMY Right 03/10/2017   Procedure: XI ROBOTIC ASSITED RADICAL NEPHRECTOMY;  Surgeon: Nickie Retort, MD;  Location: [facility];  Service: Urology;  Laterality: Right;  . TOE AMPUTATION Bilateral    both great toe ,, left foot 2nd toe 1/2    Prior to Admission medications   Medication Sig Start Date End Date Taking? Authorizing Provider  BREO ELLIPTA 100-25 MCG/INH AEPB Inhale 1 puff by mouth once daily Patient taking differently:  Inhale 1 puff into the lungs daily. 08/09/20  Yes [name], MD  Calcium Carbonate Antacid (TUMS PO) Take 1 tablet by mouth 3 (three) times daily with meals.   Yes [provider]  Carboxymethylcellulose Sodium (THERATEARS OP) Place 1 drop into both eyes daily as needed (dry eyes).   Yes [provider]  diphenhydrAMINE (BENADRYL) 25 MG tablet Take 25 mg by mouth daily as needed for allergies or itching.   Yes [provider]  gabapentin (NEURONTIN) 300 MG capsule TAKE 1 CAPSULE BY MOUTH THREE TIMES DAILY. REQUIRES OFFICE VISIT BEFORE ANY FURTHER REFILLS CAN BE GIVEN Patient taking differently: Take 300 mg by mouth 3 (three) times daily as needed (nerve pain). 06/21/20  Yes [name], MD  insulin degludec (TRESIBA FLEXTOUCH) 100 UNIT/ML FlexTouch Pen Inject 0.3 mLs (30 Units total) into the skin daily. Hold if fasting blood sugars are <130. Patient taking differently: Inject 30-50 Units into the skin daily as needed (CBG >130). 02/11/20  Yes Pickard, Cammie Mcgee, MD  linezolid (ZYVOX) 600 MG tablet Take 600 mg by mouth 2 (two) times daily. 08/22/20  Yes [provider]  multivitamin (RENA-VIT) TABS tablet Take 1 tablet by mouth daily.   Yes [provider]  PROAIR HFA 108 (90 Base) MCG/ACT inhaler INHALE 2 PUFFS BY MOUTH EVERY 6 HOURS AS NEEDED FOR WHEEZING FOR SHORTNESS OF BREATH Patient taking differently: Inhale 2 puffs into the lungs every 6 (six) hours as needed for wheezing or shortness of breath. 08/09/20  Yes [name], MD  rosuvastatin (CRESTOR) 20 MG tablet Take 1 tablet (20 mg total) by mouth daily. 11/23/19  Yes [name], MD  SANTYL ointment Apply 1 application topically daily as needed (wound care). 03/29/20  Yes [provider]  Blood Glucose Monitoring Suppl (ONE TOUCH ULTRA 2) w/Device KIT Use to check BS BID-QID Dx:E11.9 05/19/17   [name], MD  doxycycline (VIBRA-TABS) 100 MG tablet Take 1 tablet (100  mg total) by mouth 2 (two) times daily. Patient not taking: No sig reported 08/18/20   Edrick Kins, DPM  Insulin Pen Needle (LIVE BETTER PEN NEEDLES) 31G X 6 MM MISC To use with Tresiba pens daily 02/05/19   [name], MD  Lancets [facility] ULTRASOFT) lancets Use as instructed 05/19/17   [name], MD  [facility] ULTRA test strip USE AS DIRECTED TO MONITOR  FSBS 3 TIMES DAILY 01/27/20   Annie Main, FNP    Current Facility-Administered Medications  Medication Dose Route Frequency Provider Last Rate Last Admin  . 0.9 %  sodium chloride infusion   Intravenous Continuous Mansy, Jan A, MD 100 mL/hr at 08/28/20 0112 New Bag at 08/28/20  0112  . 0.9 %  sodium chloride infusion  100 mL Intravenous PRN Dwana Melena, MD      . 0.9 %  sodium chloride infusion  100 mL Intravenous PRN Dwana Melena, MD      . acetaminophen (TYLENOL) tablet 650 mg  650 mg Oral Q6H PRN Mansy, Jan A, MD       Or  . acetaminophen (TYLENOL) suppository 650 mg  650 mg Rectal Q6H PRN Mansy, Jan A, MD      . albuterol (VENTOLIN HFA) 108 (90 Base) MCG/ACT inhaler 2 puff  2 puff Inhalation Q4H PRN Mansy, Jan A, MD      . alteplase (CATHFLO ACTIVASE) injection 2 mg  2 mg Intracatheter Once PRN Dwana Melena, MD      . Bevacizumab (AVASTIN) SOLN 1.25 mg  1.25 mg Intravitreal  [name], MD   1.25 mg at 05/12/18 1319  . Bevacizumab (AVASTIN) SOLN 1.25 mg  1.25 mg Intravitreal  [name], MD   1.25 mg at 06/13/18 0055  . Bevacizumab (AVASTIN) SOLN 1.25 mg  1.25 mg Intravitreal  [name], MD   1.25 mg at 07/10/18 1308  . Bevacizumab (AVASTIN) SOLN 1.25 mg  1.25 mg Intravitreal  [name], MD   1.25 mg at 08/21/18 1312  . Bevacizumab (AVASTIN) SOLN 1.25 mg  1.25 mg Intravitreal  [name], MD   1.25 mg at 10/02/18 1510  . ceFEPIme (MAXIPIME) 1 g in sodium chloride 0.9 % 100 mL IVPB  1 g Intravenous Q24H Efraim Kaufmann, RPH 200 mL/hr at 08/28/20 0113 1 g at 08/28/20 0113  . Chlorhexidine Gluconate  Cloth 2 % PADS 6 each  6 each Topical Q0600 Dwana Melena, MD      . collagenase [facility]) ointment 1 application  1 application Topical B2620 Mansy, Jan A, MD      . diphenhydrAMINE (BENADRYL) capsule 25 mg  25 mg Oral Daily PRN Mansy, Jan A, MD      . fluticasone furoate-vilanterol (BREO ELLIPTA) 100-25 MCG/INH 1 puff  1 puff Inhalation Daily Mansy, Jan A, MD      . gabapentin (NEURONTIN) capsule 300 mg  300 mg Oral TID Mansy, Jan A, MD   300 mg at 08/27/20 2231  . heparin injection 1,000 Units  1,000 Units Dialysis PRN Dwana Melena, MD      . heparin injection 5,000 Units  5,000 Units Dialysis PRN Dwana Melena, MD      . heparin injection 5,000 Units  5,000 Units Subcutaneous Q8H Mansy, Arvella Merles, MD   5,000 Units at 08/28/20 0515  . insulin aspart (novoLOG) injection 0-15 Units  0-15 Units Subcutaneous TID WC Mansy, Jan A, MD      . insulin glargine (LANTUS) injection 30 Units  30 Units Subcutaneous QHS Mansy, Arvella Merles, MD   30 Units at 08/27/20 2359  . iohexol (OMNIPAQUE) 300 MG/ML solution 100 mL  100 mL Intravenous Once PRN Truddie Hidden, MD      . lidocaine (PF) (XYLOCAINE) 1 % injection 5 mL  5 mL Intradermal PRN Dwana Melena, MD      . lidocaine-prilocaine (EMLA) cream 1 application  1 application Topical PRN Dwana Melena, MD      . magnesium hydroxide (MILK OF MAGNESIA) suspension 30 mL  30 mL Oral Daily PRN Mansy, Jan A, MD      . morphine 2 MG/ML injection 2 mg  2 mg Intravenous Q2H PRN  Mansy, Jan A, MD      . multivitamin (RENA-VIT) tablet 1 tablet  1 tablet Oral QHS Mansy, Jan A, MD   1 tablet at 08/28/20 0000  . ondansetron (ZOFRAN) tablet 4 mg  4 mg Oral Q6H PRN Mansy, Jan A, MD       Or  . ondansetron [facility]) injection 4 mg  4 mg Intravenous Q6H PRN Mansy, Arvella Merles, MD      . pentafluoroprop-tetrafluoroeth [facility]) aerosol 1 application  1 application Topical PRN Dwana Melena, MD      . polyvinyl alcohol (LIQUIFILM TEARS) 1.4 % ophthalmic solution 1 drop  1 drop Both Eyes Daily PRN  Mansy, Jan A, MD      . rosuvastatin (CRESTOR) tablet 20 mg  20 mg Oral QHS Mansy, Jan A, MD   20 mg at 08/28/20 0000  . traZODone (DESYREL) tablet 25 mg  25 mg Oral QHS PRN Mansy, Arvella Merles, MD       Current Outpatient Medications  Medication Sig Dispense Refill  . BREO ELLIPTA 100-25 MCG/INH AEPB Inhale 1 puff by mouth once daily (Patient taking differently: Inhale 1 puff into the lungs daily.) 180 each 0  . Calcium Carbonate Antacid (TUMS PO) Take 1 tablet by mouth 3 (three) times daily with meals.    . Carboxymethylcellulose Sodium (THERATEARS OP) Place 1 drop into both eyes daily as needed (dry eyes).    . diphenhydrAMINE (BENADRYL) 25 MG tablet Take 25 mg by mouth daily as needed for allergies or itching.    . gabapentin (NEURONTIN) 300 MG capsule TAKE 1 CAPSULE BY MOUTH THREE TIMES DAILY. REQUIRES OFFICE VISIT BEFORE ANY FURTHER REFILLS CAN BE GIVEN (Patient taking differently: Take 300 mg by mouth 3 (three) times daily as needed (nerve pain).) 90 capsule 0  . insulin degludec (TRESIBA FLEXTOUCH) 100 UNIT/ML FlexTouch Pen Inject 0.3 mLs (30 Units total) into the skin daily. Hold if fasting blood sugars are <130. (Patient taking differently: Inject 30-50 Units into the skin daily as needed (CBG >130).) 45 mL 0  . linezolid (ZYVOX) 600 MG tablet Take 600 mg by mouth 2 (two) times daily.    . multivitamin (RENA-VIT) TABS tablet Take 1 tablet by mouth daily.    Marland Kitchen PROAIR HFA 108 (90 Base) MCG/ACT inhaler INHALE 2 PUFFS BY MOUTH EVERY 6 HOURS AS NEEDED FOR WHEEZING FOR SHORTNESS OF BREATH (Patient taking differently: Inhale 2 puffs into the lungs every 6 (six) hours as needed for wheezing or shortness of breath.) 9 g 0  . rosuvastatin (CRESTOR) 20 MG tablet Take 1 tablet (20 mg total) by mouth daily. 90 tablet 3  . SANTYL ointment Apply 1 application topically daily as needed (wound care).    . Blood Glucose Monitoring Suppl (ONE TOUCH ULTRA 2) w/Device KIT Use to check BS BID-QID Dx:E11.9 1 each 1  .  doxycycline (VIBRA-TABS) 100 MG tablet Take 1 tablet (100 mg total) by mouth 2 (two) times daily. (Patient not taking: No sig reported) 20 tablet 0  . Insulin Pen Needle (LIVE BETTER PEN NEEDLES) 31G X 6 MM MISC To use with Tresiba pens daily 100 each 3  . Lancets (ONETOUCH ULTRASOFT) lancets Use as instructed 300 each 3  . ONETOUCH ULTRA test strip USE AS DIRECTED TO MONITOR  FSBS 3 TIMES DAILY 300 strip 3    Allergies as of 08/27/2020 - Review Complete 08/27/2020  Allergen Reaction Noted  . Latex Itching 08/27/2020  . Penicillins Other (See Comments) 11/28/2012  .  Adhesive [tape] Rash 07/30/2017    Family History  Problem Relation Age of Onset  . Heart disease Mother   . Diabetes Sister     Social History   Socioeconomic History  . Marital status: Single    Spouse name: Not on file  . Number of children: 2  . Years of education: 3  . Highest education level: Not on file  Occupational History  . Not on file  Tobacco Use  . Smoking status: Never Smoker  . Smokeless tobacco: Never Used  Vaping Use  . Vaping Use: Never used  Substance and Sexual Activity  . Alcohol use: Yes    Alcohol/week: 0.0 standard drinks    Comment: occ  . Drug use: No  . Sexual activity: Not Currently    Partners: Male  Other Topics Concern  . Not on file  Social History Narrative  . Not on file   Social Determinants of Health   Financial Resource Strain: Low Risk   . Difficulty of Paying Living Expenses: Not very hard  Food Insecurity: Not on file  Transportation Needs: Not on file  Physical Activity: Not on file  Stress: Not on file  Social Connections: Not on file  Intimate Partner Violence: Not on file    Review of Systems: See HPI, all other systems reviewed and are negative.   Physical Exam: Vital signs in last 24 hours: Temp:  [98 F (36.7 C)-98.1 F (36.7 C)] 98.1 F (36.7 C) (02/06 1619) Pulse Rate:  [67-107] 68 (02/07 0630) Resp:  [13-27] 13 (02/07 0630) BP:  (101-158)/(55-105) 103/60 (02/07 0630) SpO2:  [92 %-100 %] 96 % (02/07 0630) Weight:  [88.5 kg] 88.5 kg (02/06 1331)   General:  Alert 70 year old female fatigued appearing but in no acute distress. Head:  Normocephalic and atraumatic. Eyes:  No scleral icterus. Conjunctiva pink. Ears:  Normal auditory acuity. Nose:  No deformity, discharge or lesions. Mouth: Poor dentition.  No ulcers or lesions.  Neck:  Supple. No lymphadenopathy or thyromegaly.  Lungs: Breath sounds clear throughout.  Heart: Regular rate and rhythm, no murmurs. Abdomen: Soft, superficial firm area from the right mid abdomen radiates across to the left mid abdomen, moderate area of ecchymosis left of the umbilicus.  Right mid abdominal tenderness radiates to the right lower quadrant without rebound or guarding.  Very few bowel sounds per auscultation.  NG tube intact, suction is off. Rectal: Deferred. Musculoskeletal:  Symmetrical without gross deformities.  Pulses:  Normal pulses noted. Extremities:  Without clubbing or edema.  Left upper extremity fistula with positive bruit and thrill. Neurologic:  Alert and  oriented x4. No focal deficits.  Skin: Numerous abrasions and small ulcerations to the upper and lower extremities. Psych:  Alert and cooperative. Normal mood and affect.  Intake/Output from previous day: 02/06 0701 - 02/07 0700 In: 394.8 [IV Piggyback:394.8] Out: -  Intake/Output this shift: No intake/output data recorded.  Lab Results: Recent Labs    08/27/20 1350 08/28/20 0424  WBC 10.7* 9.4  HGB 12.8 11.3*  HCT 38.4 34.5*  PLT 204 160   BMET Recent Labs    08/27/20 1350 08/28/20 0424  NA 137 137  K 4.9 5.9*  CL 93* 93*  CO2 22 24  GLUCOSE 260* 143*  BUN 71* 76*  CREATININE 8.79* 8.71*  CALCIUM 8.2* 7.7*   LFT Recent Labs    08/28/20 0424  PROT 6.3*  ALBUMIN 2.9*  AST 17  ALT 11  ALKPHOS 59  BILITOT 0.9  PT/INR No results for input(s): LABPROT, INR in the last 72  hours. Hepatitis Panel No results for input(s): HEPBSAG, HCVAB, HEPAIGM, HEPBIGM in the last 72 hours.    Studies/Results: CT ABDOMEN PELVIS WO CONTRAST  Result Date: 08/27/2020 CLINICAL DATA:  Nephrectomy for right-sided renal cell carcinoma. Concern for bowel obstruction. EXAM: CT ABDOMEN AND PELVIS WITHOUT CONTRAST TECHNIQUE: Multidetector CT imaging of the abdomen and pelvis was performed following the standard protocol without IV contrast. COMPARISON:  June 28, 2020 FINDINGS: Lower chest: The lung bases are clear. The heart size is normal. Hepatobiliary: The liver is normal. Normal gallbladder.There is no biliary ductal dilation. Pancreas: Normal contours without ductal dilatation. No peripancreatic fluid collection. Spleen: Unremarkable. Adrenals/Urinary Tract: --Adrenal glands: Unremarkable. --Right kidney/ureter: The right kidney is surgically absent. --Left kidney/ureter: The left kidney appears atrophic. --Urinary bladder: Unremarkable. Stomach/Bowel: --Stomach/Duodenum: No hiatal hernia or other gastric abnormality. Normal duodenal course and caliber. --Small bowel: There are mildly dilated loops of small bowel scattered throughout the abdomen to the level of the patient's ileocecal valve. --Colon: There is moderate distention of the cecum. There is an above transition point at the level of the ascending colon with there is a probable underlying colonic mass. The remaining portions of the patient's colon are completely decompressed aside from the rectum. --Appendix: Normal. Vascular/Lymphatic: Atherosclerotic calcification is present within the non-aneurysmal abdominal aorta, without hemodynamically significant stenosis. --No retroperitoneal lymphadenopathy. --there are few mildly enlarged mesenteric lymph nodes in the right lower quadrant and right mid abdomen. --No pelvic or inguinal lymphadenopathy. Reproductive: Unremarkable Other: No ascites or free air. The abdominal wall is normal.  Musculoskeletal. No acute displaced fractures. IMPRESSION: 1. Small and large bowel obstruction that appears to be secondary to the previously demonstrated colonic mass in the ascending colon. 2. There are few mildly enlarged mesenteric lymph nodes in the right lower quadrant and right mid abdomen, concerning for nodal metastatic disease. 3. Status post right nephrectomy. 4. Atrophic left kidney. Aortic Atherosclerosis (ICD10-I70.0). Electronically Signed   By: Constance Holster M.D.   On: 08/27/2020 19:51   DG Abdomen 1 View  Result Date: 08/28/2020 CLINICAL DATA:  Nasogastric tube placement. EXAM: ABDOMEN - 1 VIEW COMPARISON:  CT abdomen and pelvis 08/27/2020 FINDINGS: The tip of the nasogastric tube is near the GE junction. There is gas in the stomach. Gas within bowel loops in the mid abdomen and left lower abdomen. Limited evaluation for free air on the supine image. IMPRESSION: 1. Nasogastric tube tip is near the GE junction. 2. Persistent gas-filled loops of bowel in the abdomen. Findings remain compatible with a bowel obstruction. Electronically Signed   By: Markus Daft M.D.   On: 08/28/2020 07:49    IMPRESSION/PLAN:  53.  70 year old female admitted to the hospital 08/27/2020 with N/V and RLQ/periumbilical abdominal pain.  CTAP identified a small bowel and colon obstruction secondary ascending colon mass, most likely malignant mass. NGT placed.  -NPO -Surgical consult  -Colonoscopy timing to be verified by Dr. Ardis Hughs. She will require water enemas for bowel prep in setting of small bowel and colon obstruction.  -IV fluids per the medical team and nephrology  -Pain management per the hospitalist -Zofran 4 mg IV every 6 hours as needed  2.  History of renal cell carcinoma s/p partial right nephrectomy 2018 with ESRD on HD  -Patient will be transferred to [facility] for admission and dialysis  3. GERD. Dysphagia.  -Pantoprazole 40 mg IV Q day - consider EGD pending her clinical  course.  4. Chronic normocytic anemia, secondary to ESRD and suspect colon malignancy   5. DM II  6. Right 3rd toe ulcer/cellulitis on Vanco and Cefepime   Noralyn Pick  08/28/2020, 9:00 AM  ________________________________________________________________________  Velora Heckler GI MD note:  I personally examined the patient, reviewed the data and agree with the assessment and plan described above.  She has an obstructing mass in her right colon. I discussed her case with surgery and we will attempt to evaluate her colon as best that we can tomorrow.  She has bowel sounds, passed a normal BM yesterday before coming to ER and is passing a small amount of flatus today-- she may tolerate an oral prep and that would definitely be better than any potential rectal prep. I discussed prep with her RN who wrote it down to pass on to later RNs.  The plan is to attempt a normal split dose Movi prep via her NG tube that will be clamped after the first prep dose tonight. If she responds to the first prep with diarrhea and does not vomit then she will repeat with the second dose of prep at 3am tomorrow for a colonoscopy later in the morning with Dr. Havery Moros. If she does not have any stool output by Midnight tonight OR if she vomits any of the first dose of prep then they will not attempt a second dose and instead she will complete 2 tap water enemas at 9am tomorrow morning.     Owens Loffler, MD [facility] Gastroenterology Pager [phone]

## 2020-08-28 NOTE — Plan of Care (Signed)
°  Problem: Education: °Goal: Knowledge of General Education information will improve °Description: Including pain rating scale, medication(s)/side effects and non-pharmacologic comfort measures °Outcome: Progressing °  °Problem: Nutrition: °Goal: Adequate nutrition will be maintained °Outcome: Not Progressing °  °

## 2020-08-28 NOTE — Consult Note (Signed)
PODIATRY CONSULTATION  Shaneta Cervenka MRN [id number] DOB11/15/1952 DOA 08/27/2020  Requesting MD: Irine Seal  Reason for Consult: Ulcers/osteomyelitis bilateral third digits.   HPI: 70 y.o. 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  Patient last seen in our office 08/18/2020 for chronic nonhealing ulcers of the bilateral third digits.  Patient has been managed at the [facility]. She has past surgical history of bilateral first and second digit amputations.  She had been on oral antibiotic doxycycline however that was recently changed based on recent cultures.   Podiatry consulted today to evaluate the patient since she was scheduled for surgery to the third digits bilaterally for management of the chronic osteomyelitis of the digits.   Past Medical History:  Diagnosis Date  . Anemia   . Anemia associated with chronic renal failure   . Anemia in chronic kidney disease 09/29/2018  . Arthritis   . Asthma   . Blood transfusion without reported diagnosis    Phreesia 02/27/2020  . Cancer ([place])    Phreesia 02/27/2020  . Cataract    OD  . Chronic kidney disease    Phreesia 02/27/2020  . Coronary artery calcification seen on CAT scan 02/17/2018   Coronary calcification on CT  . Depression   . Diabetes mellitus without complication ([place])    Phreesia 02/27/2020  . Diabetic retinopathy of both eyes ([place])   . Diabetic ulcer of right foot associated with type 2 diabetes mellitus ([place])   . ESRD (end stage renal disease) on dialysis ([place])   . Essential hypertension 02/17/2018   Essential hypertension  . GERD (gastroesophageal reflux disease)    pt denies  . HLD (hyperlipidemia)   . Hypertension   . Hypertensive retinopathy    OU  . Left ventricular dysfunction 04/07/2018   Left ventricle dysfunction  . Microalbuminuria due to type 2 diabetes mellitus ([place])    . Neuromuscular disorder ([place])    diabetic neuropathy  . Nonproliferative retinopathy due to secondary diabetes ([place])   . Nonsustained ventricular tachycardia ([place]) 08/28/2018   Nonsustained ventricular tachycardia  . Pneumonia    hx of   . Renal cell cancer ([place])   . Renal insufficiency   . Right renal mass 03/10/2017  . Type 2 diabetes, controlled, with neuropathy ([place]) 06/22/2013  . Ulcer of other part of foot 06/22/2013  . 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 ([facility])    CBC Latest Ref Rng & Units 08/28/2020 08/27/2020 05/26/2020  WBC 4.0 - 10.5 K/uL 9.4 10.7(H) 7.0  Hemoglobin 12.0 - 15.0 g/dL 11.3(L) 12.8 10.8(L)  Hematocrit 36.0 - 46.0 % 34.5(L) 38.4 32.6(L)  Platelets 150 - 400 K/uL 160 204 237   BMP Latest Ref Rng & Units 08/28/2020 08/27/2020 05/19/2020  Glucose 70 - 99 mg/dL 143(H) 260(H) 154(H)  BUN 8 - 23 mg/dL 76(H) 71(H) 34(H)  Creatinine 0.44 - 1.00 mg/dL 8.71(H) 8.79(H) 5.63(H)  BUN/Creat Ratio 6 - 22 (calc) - - -  Sodium 135 - 145 mmol/L 137 137 140  Potassium 3.5 - 5.1 mmol/L 5.9(H) 4.9 4.4  Chloride 98 - 111 mmol/L 93(L) 93(L) 92(L)  CO2 22 - 32 mmol/L 24 22 29   Calcium 8.9 - 10.3 mg/dL 7.7(L) 8.2(L) 9.3   Physical examination: General: The patient is alert and oriented x3 in no acute distress.  Dermatology: Open wounds bilateral third digits with exposed bone right foot.  Findings consistent with osteomyelitis; chronic and somewhat stable.  No malodor noted.  Associated edema noted to the digits.   Neurovascular status intact.  Diminished sensation bilateral consistent with peripheral polyneuropathy  Musculoskeletal Exam: Prior history of bilateral first and second digit amputations.    Assessment/Plan of Care:  -Osteomyelitis/ulcers bilateral third digits  -The ulcers are currently somewhat stable.  Continue IV antibiotics as per hospitalist.  Patient scheduled for colonoscopy tomorrow, 08/29/2020.  Assuming everything goes well and the patient is  stable and improved postoperatively, it may be best to proceed with planned surgery of the third digits bilateral to address the osteomyelitis to the right third toe and ulcer to the left third toe while the patient is inpatient.  -Podiatry will follow and coordinate with hospitalist to proceed with surgery once the patient is stabilized and cleared from a GI standpoint, since this takes precedence.  -Order placed for wound care.  Betadine and light dressing daily.   -Podiatry will follow.  *Please contact me directly with any questions or concerns. [phone] cell    Edrick Kins, DPM [facility]  Dr. Edrick Kins, DPM    [address], [place] [address]                Office [phone]  Fax [phone]

## 2020-08-28 NOTE — ED Notes (Signed)
Dialysis is scheduled around 12p today, [facility] has been notified of transporting pt to Dialysis unit. Abby RN is aware to call report to dialysis unit. Also, [facility] is alerting bed placement of the need for a bed when dialysis is complete.

## 2020-08-29 ENCOUNTER — Encounter (HOSPITAL_COMMUNITY): Payer: Self-pay | Admitting: Family Medicine

## 2020-08-29 ENCOUNTER — Inpatient Hospital Stay (HOSPITAL_COMMUNITY): Payer: Medicare Other

## 2020-08-29 ENCOUNTER — Encounter (HOSPITAL_COMMUNITY)
Admission: EM | Disposition: A | Discharge status: Skilled Nursing Facility | Payer: Self-pay | Source: Home / Self Care | Attending: Internal Medicine

## 2020-08-29 ENCOUNTER — Inpatient Hospital Stay (HOSPITAL_COMMUNITY): Payer: Medicare Other | Admitting: Anesthesiology

## 2020-08-29 DIAGNOSIS — E133299 Other specified diabetes mellitus with mild nonproliferative diabetic retinopathy without macular edema, unspecified eye: Secondary | ICD-10-CM

## 2020-08-29 DIAGNOSIS — D126 Benign neoplasm of colon, unspecified: Secondary | ICD-10-CM

## 2020-08-29 DIAGNOSIS — K56600 Partial intestinal obstruction, unspecified as to cause: Secondary | ICD-10-CM | POA: Diagnosis not present

## 2020-08-29 DIAGNOSIS — D490 Neoplasm of unspecified behavior of digestive system: Secondary | ICD-10-CM

## 2020-08-29 DIAGNOSIS — E11628 Type 2 diabetes mellitus with other skin complications: Secondary | ICD-10-CM | POA: Diagnosis not present

## 2020-08-29 DIAGNOSIS — K6389 Other specified diseases of intestine: Secondary | ICD-10-CM | POA: Diagnosis not present

## 2020-08-29 DIAGNOSIS — L97519 Non-pressure chronic ulcer of other part of right foot with unspecified severity: Secondary | ICD-10-CM | POA: Diagnosis not present

## 2020-08-29 HISTORY — PX: POLYPECTOMY: SHX5525

## 2020-08-29 HISTORY — PX: BIOPSY: SHX5522

## 2020-08-29 HISTORY — PX: COLONOSCOPY WITH PROPOFOL: SHX5780

## 2020-08-29 HISTORY — PX: SUBMUCOSAL TATTOO INJECTION: SHX6856

## 2020-08-29 LAB — CBC
HCT: 33 % — ABNORMAL LOW (ref 36.0–46.0)
Hemoglobin: 10.6 g/dL — ABNORMAL LOW (ref 12.0–15.0)
MCH: 30.9 pg (ref 26.0–34.0)
MCHC: 32.1 g/dL (ref 30.0–36.0)
MCV: 96.2 fL (ref 80.0–100.0)
Platelets: 146 10*3/uL — ABNORMAL LOW (ref 150–400)
RBC: 3.43 MIL/uL — ABNORMAL LOW (ref 3.87–5.11)
RDW: 15.4 % (ref 11.5–15.5)
WBC: 7.4 10*3/uL (ref 4.0–10.5)
nRBC: 0 % (ref 0.0–0.2)

## 2020-08-29 LAB — GLUCOSE, CAPILLARY
Glucose-Capillary: 70 mg/dL (ref 70–99)
Glucose-Capillary: 57 mg/dL — ABNORMAL LOW (ref 70–99)
Glucose-Capillary: 94 mg/dL (ref 70–99)
Glucose-Capillary: 77 mg/dL (ref 70–99)
Glucose-Capillary: 89 mg/dL (ref 70–99)
Glucose-Capillary: 85 mg/dL (ref 70–99)

## 2020-08-29 LAB — RENAL FUNCTION PANEL
Albumin: 2.5 g/dL — ABNORMAL LOW (ref 3.5–5.0)
Anion gap: 18 — ABNORMAL HIGH (ref 5–15)
BUN: 41 mg/dL — ABNORMAL HIGH (ref 8–23)
CO2: 22 mmol/L (ref 22–32)
Calcium: 7.4 mg/dL — ABNORMAL LOW (ref 8.9–10.3)
Chloride: 99 mmol/L (ref 98–111)
Creatinine, Ser: 5.74 mg/dL — ABNORMAL HIGH (ref 0.44–1.00)
GFR, Estimated: 7 mL/min — ABNORMAL LOW (ref >60)
Glucose, Bld: 72 mg/dL (ref 70–99)
Phosphorus: 6.3 mg/dL — ABNORMAL HIGH (ref 2.5–4.6)
Potassium: 4.3 mmol/L (ref 3.5–5.1)
Sodium: 139 mmol/L (ref 135–145)

## 2020-08-29 LAB — PREALBUMIN: Prealbumin: 11.4 mg/dL — ABNORMAL LOW (ref 18–38)

## 2020-08-29 LAB — MAGNESIUM: Magnesium: 1.8 mg/dL (ref 1.7–2.4)

## 2020-08-29 SURGERY — COLONOSCOPY WITH PROPOFOL
Anesthesia: General

## 2020-08-29 MED ORDER — GENTAMICIN SULFATE 40 MG/ML IJ SOLN
5.0000 mg/kg | INTRAVENOUS | Status: AC
  Administered 2020-08-30: 440 mg via INTRAVENOUS
  Filled 2020-08-29: qty 11

## 2020-08-29 MED ORDER — PHENYLEPHRINE 40 MCG/ML (10ML) SYRINGE FOR IV PUSH (FOR BLOOD PRESSURE SUPPORT)
PREFILLED_SYRINGE | INTRAVENOUS | Status: DC | PRN
  Administered 2020-08-29: 80 ug via INTRAVENOUS
  Administered 2020-08-29 (×2): 120 ug via INTRAVENOUS
  Administered 2020-08-29: 80 ug via INTRAVENOUS
  Administered 2020-08-29: 120 ug via INTRAVENOUS
  Administered 2020-08-29 (×2): 80 ug via INTRAVENOUS
  Administered 2020-08-29 (×2): 120 ug via INTRAVENOUS

## 2020-08-29 MED ORDER — MUPIROCIN 2 % EX OINT
1.0000 "application " | TOPICAL_OINTMENT | Freq: Two times a day (BID) | CUTANEOUS | Status: AC
  Administered 2020-08-29 – 2020-09-03 (×10): 1 via NASAL
  Filled 2020-08-29 (×5): qty 22

## 2020-08-29 MED ORDER — VANCOMYCIN HCL 750 MG/150ML IV SOLN
750.0000 mg | Freq: Once | INTRAVENOUS | Status: AC
  Administered 2020-08-29: 750 mg via INTRAVENOUS
  Filled 2020-08-29: qty 150

## 2020-08-29 MED ORDER — LIDOCAINE 2% (20 MG/ML) 5 ML SYRINGE
INTRAMUSCULAR | Status: DC | PRN
  Administered 2020-08-29: 60 mg via INTRAVENOUS

## 2020-08-29 MED ORDER — CLINDAMYCIN PHOSPHATE 900 MG/50ML IV SOLN
900.0000 mg | INTRAVENOUS | Status: AC
  Administered 2020-08-30: 900 mg via INTRAVENOUS
  Filled 2020-08-29: qty 50

## 2020-08-29 MED ORDER — SPOT INK MARKER SYRINGE KIT
PACK | SUBMUCOSAL | Status: AC
  Filled 2020-08-29: qty 5

## 2020-08-29 MED ORDER — SPOT INK MARKER SYRINGE KIT
PACK | SUBMUCOSAL | Status: DC | PRN
  Administered 2020-08-29: 5 mL via SUBMUCOSAL

## 2020-08-29 MED ORDER — PROPOFOL 10 MG/ML IV BOLUS
INTRAVENOUS | Status: DC | PRN
  Administered 2020-08-29: 180 mg via INTRAVENOUS

## 2020-08-29 MED ORDER — CHLORHEXIDINE GLUCONATE CLOTH 2 % EX PADS
6.0000 | MEDICATED_PAD | Freq: Once | CUTANEOUS | Status: AC
  Administered 2020-08-30: 6 via TOPICAL

## 2020-08-29 MED ORDER — ONDANSETRON HCL 4 MG/2ML IJ SOLN
INTRAMUSCULAR | Status: DC | PRN
  Administered 2020-08-29: 4 mg via INTRAVENOUS

## 2020-08-29 MED ORDER — ACETAMINOPHEN 500 MG PO TABS
1000.0000 mg | ORAL_TABLET | Freq: Once | ORAL | Status: AC
  Administered 2020-08-29: 1000 mg via ORAL
  Filled 2020-08-29: qty 2

## 2020-08-29 MED ORDER — IOHEXOL 300 MG/ML  SOLN
75.0000 mL | Freq: Once | INTRAMUSCULAR | Status: AC | PRN
  Administered 2020-08-29: 75 mL via INTRAVENOUS

## 2020-08-29 MED ORDER — SODIUM CHLORIDE 0.9 % IV SOLN: INTRAVENOUS | Status: DC | PRN

## 2020-08-29 MED ORDER — EPHEDRINE SULFATE-NACL 50-0.9 MG/10ML-% IV SOSY
PREFILLED_SYRINGE | INTRAVENOUS | Status: DC | PRN
  Administered 2020-08-29 (×3): 10 mg via INTRAVENOUS

## 2020-08-29 MED ORDER — DEXAMETHASONE SODIUM PHOSPHATE 10 MG/ML IJ SOLN
INTRAMUSCULAR | Status: DC | PRN
  Administered 2020-08-29: 4 mg via INTRAVENOUS

## 2020-08-29 MED ORDER — CHLORHEXIDINE GLUCONATE CLOTH 2 % EX PADS
6.0000 | MEDICATED_PAD | Freq: Once | CUTANEOUS | Status: AC
  Administered 2020-08-29: 6 via TOPICAL

## 2020-08-29 MED ORDER — DEXTROSE 50 % IV SOLN
12.5000 g | INTRAVENOUS | Status: AC
  Administered 2020-08-29: 12.5 g via INTRAVENOUS

## 2020-08-29 MED ORDER — SUCCINYLCHOLINE CHLORIDE 200 MG/10ML IV SOSY
PREFILLED_SYRINGE | INTRAVENOUS | Status: DC | PRN
  Administered 2020-08-29: 100 mg via INTRAVENOUS

## 2020-08-29 SURGICAL SUPPLY — 22 items

## 2020-08-29 NOTE — Anesthesia Procedure Notes (Signed)
Procedure Name: Intubation Date/Time: 08/29/2020 10:19 AM Performed by: Renato Shin, CRNA Pre-anesthesia Checklist: Patient identified, Emergency Drugs available, Suction available and Patient being monitored Patient Re-evaluated:Patient Re-evaluated prior to induction Oxygen Delivery Method: Circle system utilized Preoxygenation: Pre-oxygenation with 100% oxygen Induction Type: IV induction and Rapid sequence Laryngoscope Size: Miller and 2 Grade View: Grade I Tube type: Oral Number of attempts: 1 Airway Equipment and Method: Stylet and Oral airway Placement Confirmation: ETT inserted through vocal cords under direct vision,  positive ETCO2 and breath sounds checked- equal and bilateral Secured at: 21 cm Tube secured with: Tape Dental Injury: Teeth and Oropharynx as per pre-operative assessment

## 2020-08-29 NOTE — Op Note (Signed)
[facility] Patient Name: [name] Procedure Date : 08/29/2020 MRN: [id number] Attending MD: Carlota Raspberry. Havery Moros , MD Date of Birth: 05/03/1951 CSN: [id number] Age: 69 Admit Type: Inpatient Procedure:                Colonoscopy Indications:              Abnormal CT of the GI tract - obstructing mass                            lesion in the ascending colon. Patient electively                            intrubated for this exam due to recent bowel                            obstruction Providers:                Remo Lipps P. Havery Moros, MD, Josie Dixon, RN,                            Janee Morn, Technician Referring MD:              Medicines:                Monitored Anesthesia Care Complications:            No immediate complications. Estimated blood loss:                            Minimal. Estimated Blood Loss:     Estimated blood loss was minimal. Procedure:                Pre-Anesthesia Assessment:                           - Prior to the procedure, a History and Physical                            was performed, and patient medications and                            allergies were reviewed. The patient's tolerance of                            previous anesthesia was also reviewed. The risks                            and benefits of the procedure and the sedation                            options and risks were discussed with the patient.                            All questions were answered, and informed consent                            was  obtained. Prior Anticoagulants: The patient has                            taken no previous anticoagulant or antiplatelet                            agents. ASA Grade Assessment: III - A patient with                            severe systemic disease. After reviewing the risks                            and benefits, the patient was deemed in                            satisfactory condition to undergo the  procedure.                           After obtaining informed consent, the colonoscope                            was passed under direct vision. Throughout the                            procedure, the patient's blood pressure, pulse, and                            oxygen saturations were monitored continuously. The                            PCF-H190DL ([id number]) Olympus pediatric colonoscope                            was introduced through the anus and advanced to the                            the ascending colon to examine a mass. This was the                            intended extent. The colonoscopy was performed                            without difficulty. The patient tolerated the                            procedure well. The quality of the bowel                            preparation was fair. The rectum was photographed. Scope In: 10:50:19 AM Scope Out: 11:17:10 AM Total Procedure Duration: 0 hours 26 minutes 51 seconds  Findings:      The perianal and digital rectal examinations were normal.      An ulcerated mostly obstructing large mass was found in the ascending  colon, with an extremely narrowed lumen (few mm in size). The mass was       circumferential. Biopsies were taken with a cold forceps for histology.       Area distal to the mass was tattooed circumferentially with an injection       of Spot (carbon black).      Four sessile polyps were found in the sigmoid colon and descending       colon. The polyps were 3 to 8 mm in size. These polyps were removed with       a cold snare. Resection and retrieval were complete.      The bowel prep was fair. Time was taken to lavage and adequate views       mostly obtained. Small area of the rectum with solid stool, no obvious       mass lesions or polyps there. The exam was otherwise without       abnormality. Cecal intubation performed with water immersion and use of       low Co2 to complete the exam given recent  obstruction, attempts were       made to mimize use of air. 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 Biopsied. Tattooed.                           - Four 3 to 8 mm polyps in the sigmoid colon and in                            the descending colon, removed with a cold snare.                            Resected and retrieved.                           - The examination was otherwise normal. Some areas                            of fair prep, small or very flat polyps may not                            have been seen. Recommendation:           - Return patient to hospital ward for ongoing care.                           - Advance to clear liquid diet when okay per surgery                           - Continue present medications.                           - Await pathology results.                           -  CEA level                           - CT chest for staging                           - Appreciate general surgery consultation                           - Call with questions Procedure Code(s):        --- Professional ---                           [id number], 52, Colonoscopy, flexible; with removal of                            tumor(s), polyp(s), or other lesion(s) by snare                            technique                           45381, 52, Colonoscopy, flexible; with directed                            submucosal injection(s), any substance                           45380, 59,52, Colonoscopy, flexible; with biopsy,                            single or multiple Diagnosis Code(s):        --- Professional ---                           D49.0, Neoplasm of unspecified behavior of                            digestive system                           K56.690, Other partial intestinal obstruction                           K63.5, Polyp of colon                           R93.3, Abnormal findings on diagnostic  imaging of                            other parts of digestive tract CPT copyright 2019 American Medical Association. All rights reserved. The codes documented in this report are preliminary and upon coder review may  be revised to meet current compliance requirements. Remo Lipps P. Havery Moros, MD 08/29/2020 11:28:08 AM This report has been signed electronically. Number of Addenda: 0

## 2020-08-29 NOTE — Progress Notes (Signed)
Subjective: Seen in room status post colonoscopy (with polypectomy noted )alert, said tolerated dialysis yesterday off schedule 2/2 to procedures and numerous emergent patients.  Vital signs in last 24 hours: Vitals:   08/29/20 1131 08/29/20 1135 08/29/20 1150 08/29/20 1220  BP: (!) 98/59 (!) 100/44 (!) 111/21 (!) (P) 128/51  Pulse: 87 87 82 (P) 83  Resp: 12 (!) 22 15 (P) 16  Temp:    (P) 98.2 F (36.8 C)  TempSrc:    (P) Oral  SpO2: 95% 97% 96% (P) 95%  Weight:      Height:       Weight change: 0.649 kg  Physical Exam: General: Alert chronically ill female, NG tube in place NAD Heart: RRR, no MRG Lungs: Decreased breath sounds at bases otherwise CTA nonlabored breathing Abdomen: Hypoactive BS, NT in place clamped soft minimal distention with minimal tenderness right side Extremities: No pedal edema Dialysis Access: LUA AVF positive bruit    OP HD: [facility]   3h 45min   400/500  87kg   2/2.25 bath  P2  LUA AVF Hep 5000+ 1000 midrun   - mircera 100ug last 1/11  - hect 3 ug  Problem/Plan: 1. ESRD -(normal TTS ) HD yesterday off schedule. secondary to procedures and numerous emergent patient/Change  current order IV fluids to 100 mL/h to 50ml /hr  in ESRD patient has minimal urine output baseline.K4.3 this a.m,, plan for dialysis tomorrow 2. SBO - appears to be secondary to malignancy.  Last post colonoscopy with polyps removed today/results pending.  Plans per [facility] /GI   per primary " she had missed multiple f/u's with GI.' 3. Hypertension/ volume= uses midodrine for BP control dialysis, appears euvolemic fluids as above decrease rate from 100 to 50 mL/h 4. Renal osteodystrophy -phosphorus 6.3, corrected calcium 9.4  usually she's on TUMS as a binder but would hold for now; she has refused other binders and only willing to take TUMS. Last Phos on 08/24/20 was 9.3; likely will be NPO as well bec of SBO. 5. Anemia -Hgb 10.6 this a.m.@ goal. Will hold ESA's for now, usually on Mircera  100 (last given 08/01/2020) 6. DM 7. PAD -scheduled for amputation of the right 3rd toe later this week.  Ernest Haber, PA-C [facility] Beeper [phone] 08/29/2020,12:53 PM  LOS: 2 days   Labs: Basic Metabolic Panel: Recent Labs  Lab 08/27/20 1350 08/28/20 0424 08/29/20 0303  NA 137 137 139  K 4.9 5.9* 4.3  CL 93* 93* 99  CO2 22 24 22   GLUCOSE 260* 143* 72  BUN 71* 76* 41*  CREATININE 8.79* 8.71* 5.74*  CALCIUM 8.2* 7.7* 7.4*  PHOS  --   --  6.3*   Liver Function Tests: Recent Labs  Lab 08/27/20 1350 08/28/20 0424 08/29/20 0303  AST 20 17  --   ALT 12 11  --   ALKPHOS 72 59  --   BILITOT 1.1 0.9  --   PROT 7.4 6.3*  --   ALBUMIN 3.5 2.9* 2.5*   Recent Labs  Lab 08/27/20 1350  LIPASE 33   No results for input(s): AMMONIA in the last 168 hours. CBC: Recent Labs  Lab 08/27/20 1350 08/28/20 0424 08/29/20 0302  WBC 10.7* 9.4 7.4  HGB 12.8 11.3* 10.6*  HCT 38.4 34.5* 33.0*  MCV 94.1 95.6 96.2  PLT 204 160 146*   Cardiac Enzymes: No results for input(s): CKTOTAL, CKMB, CKMBINDEX, TROPONINI in the last 168 hours. CBG: Recent Labs  Lab 08/28/20 2117 08/29/20 0440 08/29/20 0812 08/29/20 0911 08/29/20 1212  GLUCAP 97 70 57* 94 77    Studies/Results: CT ABDOMEN PELVIS WO CONTRAST  Result Date: 08/27/2020 CLINICAL DATA:  Nephrectomy for right-sided renal cell carcinoma. Concern for bowel obstruction. EXAM: CT ABDOMEN AND PELVIS WITHOUT CONTRAST TECHNIQUE: Multidetector CT imaging of the abdomen and pelvis was performed following the standard protocol without IV contrast. COMPARISON:  June 28, 2020 FINDINGS: Lower chest: The lung bases are clear. The heart size is normal. Hepatobiliary: The liver is normal. Normal gallbladder.There is no biliary ductal dilation. Pancreas: Normal contours without ductal dilatation. No peripancreatic fluid collection. Spleen: Unremarkable. Adrenals/Urinary Tract: --Adrenal glands: Unremarkable. --Right  kidney/ureter: The right kidney is surgically absent. --Left kidney/ureter: The left kidney appears atrophic. --Urinary bladder: Unremarkable. Stomach/Bowel: --Stomach/Duodenum: No hiatal hernia or other gastric abnormality. Normal duodenal course and caliber. --Small bowel: There are mildly dilated loops of small bowel scattered throughout the abdomen to the level of the patient's ileocecal valve. --Colon: There is moderate distention of the cecum. There is an above transition point at the level of the ascending colon with there is a probable underlying colonic mass. The remaining portions of the patient's colon are completely decompressed aside from the rectum. --Appendix: Normal. Vascular/Lymphatic: Atherosclerotic calcification is present within the non-aneurysmal abdominal aorta, without hemodynamically significant stenosis. --No retroperitoneal lymphadenopathy. --there are few mildly enlarged mesenteric lymph nodes in the right lower quadrant and right mid abdomen. --No pelvic or inguinal lymphadenopathy. Reproductive: Unremarkable Other: No ascites or free air. The abdominal wall is normal. Musculoskeletal. No acute displaced fractures. IMPRESSION: 1. Small and large bowel obstruction that appears to be secondary to the previously demonstrated colonic mass in the ascending colon. 2. There are few mildly enlarged mesenteric lymph nodes in the right lower quadrant and right mid abdomen, concerning for nodal metastatic disease. 3. Status post right nephrectomy. 4. Atrophic left kidney. Aortic Atherosclerosis (ICD10-I70.0). Electronically Signed   By: Constance Holster M.D.   On: 08/27/2020 19:51   DG Abdomen 1 View  Result Date: 08/28/2020 CLINICAL DATA:  Nasogastric tube placement. EXAM: ABDOMEN - 1 VIEW COMPARISON:  CT abdomen and pelvis 08/27/2020 FINDINGS: The tip of the nasogastric tube is near the GE junction. There is gas in the stomach. Gas within bowel loops in the mid abdomen and left lower  abdomen. Limited evaluation for free air on the supine image. IMPRESSION: 1. Nasogastric tube tip is near the GE junction. 2. Persistent gas-filled loops of bowel in the abdomen. Findings remain compatible with a bowel obstruction. Electronically Signed   By: Markus Daft M.D.   On: 08/28/2020 07:49   Medications: . sodium chloride 100 mL/hr at 08/28/20 0112  . ceFEPime (MAXIPIME) IV 1 g (08/28/20 2352)  . vancomycin     . Chlorhexidine Gluconate Cloth  6 each Topical Q0600  . dextrose  25 g Intravenous Once  . fluticasone furoate-vilanterol  1 puff Inhalation Daily  . gabapentin  300 mg Oral TID  . heparin injection (subcutaneous)  5,000 Units Subcutaneous Q8H  . insulin aspart  0-15 Units Subcutaneous Q4H  . insulin glargine  15 Units Subcutaneous QHS  . midodrine  10 mg Oral Once in dialysis  . multivitamin  1 tablet Oral QHS  . pantoprazole (PROTONIX) IV  40 mg Intravenous Q24H

## 2020-08-29 NOTE — Anesthesia Preprocedure Evaluation (Signed)
Anesthesia Evaluation  Patient identified by MRN, date of birth, ID band Patient awake    Reviewed: Allergy & Precautions, NPO status , Patient's Chart, lab work & pertinent test results  History of Anesthesia Complications Negative for: history of anesthetic complications  Airway Mallampati: II  TM Distance: >3 FB Neck ROM: Full    Dental  (+) Poor Dentition, Missing   Pulmonary asthma ,    Pulmonary exam normal        Cardiovascular hypertension, + CAD, + Peripheral Vascular Disease and +CHF  Normal cardiovascular exam     Neuro/Psych Depression negative neurological ROS     GI/Hepatic Neg liver ROS, GERD  ,Small and large bowel obstruction 2/2 mass   Endo/Other  diabetes  Renal/GU ESRF and DialysisRenal disease  negative genitourinary   Musculoskeletal negative musculoskeletal ROS (+)   Abdominal   Peds  Hematology  (+) anemia ,   Anesthesia Other Findings  Normal LV size with mild LV hypertrophy. EF 40%, diffuse hypokinesis. Normal RV size and systolic function. No significant valvular abnormalities.   Reproductive/Obstetrics                             Anesthesia Physical Anesthesia Plan  ASA: III  Anesthesia Plan: General   Post-op Pain Management:    Induction: Intravenous, Rapid sequence and Cricoid pressure planned  PONV Risk Score and Plan: 3 and Ondansetron, Dexamethasone, Treatment may vary due to age or medical condition and Midazolam  Airway Management Planned: Oral ETT  Additional Equipment: None  Intra-op Plan:   Post-operative Plan: Extubation in OR  Informed Consent: I have reviewed the patients History and Physical, chart, labs and discussed the procedure including the risks, benefits and alternatives for the proposed anesthesia with the patient or authorized representative who has indicated his/her understanding and acceptance.     Dental advisory  given  Plan Discussed with:   Anesthesia Plan Comments:         Anesthesia Quick Evaluation

## 2020-08-29 NOTE — Progress Notes (Signed)
PROGRESS NOTE    [name]  XHB:[id number] DOB: [birth year] DOA: 08/27/2020 PCP: [name], MD    Chief Complaint  Patient presents with  . Animal Bite  . Abdominal Pain    Brief Narrative:  Patient 70 year old female history of asthma, anemia, renal cell carcinoma status post nephrectomy, type 2 diabetes mellitus, end-stage renal disease on hemodialysis Tuesday Thursday Saturday, peripheral vascular disease, hypertension, hyperlipidemia, GERD, diabetic retinopathy as well as neuropathy presented to the ED with acute abdominal pain, intractable nausea and vomiting to liquids and solids.  Patient noted to have some constipation a week prior to admission.  Patient noted to have missed hemodialysis on Tuesday and Saturday prior to admission.  Patient also noted to have been scheduled for amputation of the right third toe per podiatry.  Patient seen in the ED work-up with CT abdomen and pelvis revealed small and large bowel obstruction secondary to previously demonstrated colonic mass in the ascending colon. NG tube placed.  Nephrology consulted.  GI consulted as well as general surgery. 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)   Colonic mass   ESRD on hemodialysis (HCC)   Benign neoplasm of colon   Small and large bowel obstruction secondary to right ascending colonic mass CEA level pending CT abdomen and pelvis concerning for small and large bowel obstruction secondary to right ascending colonic mass GI on board s/p colonoscopy on 08/29/20 showed  likely malignant tumor, biopsied, removed four 3 to 8 mm polyps, pathology pending General surgery on board, appreciate recs Continue NPO, NGT, gentle IVF CT chest ordered for complete work up Monitor closely  ESRD on HD, T/T/S Nephrology on board  Type 2 diabetes mellitus with diabetic retinopathy and neuropathy Hemoglobin A1c 7.8 (08/28/2020), hypoglycemic episode on 08/29/20  SSI, accuchecks, hypoglycemic protocol Hold home lantus for now  Right third toe nonpurulent severe cellulitis and ulcer Followed by podiatry Dr Amalia Hailey with plans for possible amputation during this admission Continue empiric IV vancomycin and cefepime  Hyperlipidemia Resume statin when no longer n.p.o  Asthma without exacerbation Continue home regimen Breo Ellipta and albuterol inhaler.    DVT prophylaxis: Heparin Code Status: Full Family Communication: No family at bedside Disposition:   Status is: Inpatient    Dispo: The patient is from: Home              Anticipated d/c is to: Home              Anticipated d/c date is: 4 to 5 days              Not stable for discharge, still pending management   Difficult to place patient : No    Consultants:   General surgery  Gastroenterology  Nephrology  Procedures:  Colonoscopy on 08/29/20   Antimicrobials:   IV cefepime 08/27/2020>>>>  IV vancomycin 08/27/2020 >>>>>   Subjective: Saw pt this morning prior to colonoscopy, denies any new complaints, wants NGT out, denies any chest pain, SOB, abdominal pain, N/V. Passing stool  Objective: Vitals:  08/29/20 1131 08/29/20 1135 08/29/20 1150 08/29/20 1220  BP: (!) 98/59 (!) 100/44 (!) 111/21 (!) (P) 128/51  Pulse: 87 87 82 (P) 83  Resp: 12 (!) 22 15 (P) 16  Temp:    (P) 98.2 F (36.8 C)  TempSrc:    (P) Oral  SpO2: 95% 97% 96% (P) 95%  Weight:      Height:        Intake/Output Summary (Last 24 hours) at 08/29/2020 1401 Last data filed at 08/29/2020 1117 Gross per 24 hour  Intake 450 ml   Output 1 ml  Net 449 ml   Filed Weights   08/27/20 1331 08/28/20 1220 08/28/20 1600  Weight: 88.5 kg 89.1 kg 88.7 kg    Examination:  General: NAD   Cardiovascular: S1, S2 present  Respiratory: CTAB  Abdomen: Soft, nontender, nondistended, bowel sounds present  Musculoskeletal: No bilateral pedal edema noted  Skin: Normal  Psychiatry: Normal mood   Data Reviewed: I have personally reviewed following labs and imaging studies  CBC: Recent Labs  Lab 08/27/20 1350 08/28/20 0424 08/29/20 0302  WBC 10.7* 9.4 7.4  HGB 12.8 11.3* 10.6*  HCT 38.4 34.5* 33.0*  MCV 94.1 95.6 96.2  PLT 204 160 146*    Basic Metabolic Panel: Recent Labs  Lab 08/27/20 1350 08/28/20 0424 08/29/20 0302 08/29/20 0303  NA 137 137  --  139  K 4.9 5.9*  --  4.3  CL 93* 93*  --  99  CO2 22 24  --  22  GLUCOSE 260* 143*  --  72  BUN 71* 76*  --  41*  CREATININE 8.79* 8.71*  --  5.74*  CALCIUM 8.2* 7.7*  --  7.4*  MG  --   --  1.8  --   PHOS  --   --   --  6.3*    GFR: Estimated Creatinine Clearance: 10.6 mL/min (A) (by C-G formula based on SCr of 5.74 mg/dL (H)).  Liver Function Tests: Recent Labs  Lab 08/27/20 1350 08/28/20 0424 08/29/20 0303  AST 20 17  --   ALT 12 11  --   ALKPHOS 72 59  --   BILITOT 1.1 0.9  --   PROT 7.4 6.3*  --   ALBUMIN 3.5 2.9* 2.5*    CBG: Recent Labs  Lab 08/28/20 2117 08/29/20 0440 08/29/20 0812 08/29/20 0911 08/29/20 1212  GLUCAP 97 70 57* 94 77     Recent Results (from the past 240 hour(s))  SARS Coronavirus 2 by RT PCR (hospital order, performed in [facility] hospital lab) Nasopharyngeal Nasopharyngeal Swab     Status: None   Collection Time: 08/27/20  9:46 PM   Specimen: Nasopharyngeal Swab  Result Value Ref Range Status   SARS Coronavirus 2 NEGATIVE NEGATIVE Final    Comment: (NOTE) SARS-CoV-2 target nucleic acids are NOT DETECTED.  The SARS-CoV-2 RNA is generally detectable in upper and lower respiratory specimens during  the acute phase of infection. The lowest concentration of SARS-CoV-2 viral copies this assay can detect is 250 copies / mL. A negative result does not preclude SARS-CoV-2 infection and should not be used as the sole basis for treatment or other patient management decisions.  A negative result may occur with improper specimen collection / handling, submission of specimen other than nasopharyngeal swab, presence of viral mutation(s) within the areas targeted by this assay, and inadequate number of viral copies (<250 copies / mL). A negative result must be combined with clinical observations,  patient history, and epidemiological information.  Fact Sheet for Patients:   StrictlyIdeas.no  Fact Sheet for Healthcare Providers: BankingDealers.co.za  This test is not yet approved or  cleared by the [place] FDA and has been authorized for detection and/or diagnosis of SARS-CoV-2 by FDA under an Emergency Use Authorization (EUA).  This EUA will remain in effect (meaning this test can be used) for the duration of the COVID-19 declaration under Section 564(b)(1) of the Act, 21 U.S.C. section 360bbb-3(b)(1), unless the authorization is terminated or revoked sooner.  Performed at [facility], [place] [address], [place], [place] [address]          Radiology Studies: CT ABDOMEN PELVIS WO CONTRAST  Result Date: 08/27/2020 CLINICAL DATA:  Nephrectomy for right-sided renal cell carcinoma. Concern for bowel obstruction. EXAM: CT ABDOMEN AND PELVIS WITHOUT CONTRAST TECHNIQUE: Multidetector CT imaging of the abdomen and pelvis was performed following the standard protocol without IV contrast. COMPARISON:  June 28, 2020 FINDINGS: Lower chest: The lung bases are clear. The heart size is normal. Hepatobiliary: The liver is normal. Normal gallbladder.There is no biliary ductal dilation. Pancreas: Normal contours without ductal  dilatation. No peripancreatic fluid collection. Spleen: Unremarkable. Adrenals/Urinary Tract: --Adrenal glands: Unremarkable. --Right kidney/ureter: The right kidney is surgically absent. --Left kidney/ureter: The left kidney appears atrophic. --Urinary bladder: Unremarkable. Stomach/Bowel: --Stomach/Duodenum: No hiatal hernia or other gastric abnormality. Normal duodenal course and caliber. --Small bowel: There are mildly dilated loops of small bowel scattered throughout the abdomen to the level of the patient's ileocecal valve. --Colon: There is moderate distention of the cecum. There is an above transition point at the level of the ascending colon with there is a probable underlying colonic mass. The remaining portions of the patient's colon are completely decompressed aside from the rectum. --Appendix: Normal. Vascular/Lymphatic: Atherosclerotic calcification is present within the non-aneurysmal abdominal aorta, without hemodynamically significant stenosis. --No retroperitoneal lymphadenopathy. --there are few mildly enlarged mesenteric lymph nodes in the right lower quadrant and right mid abdomen. --No pelvic or inguinal lymphadenopathy. Reproductive: Unremarkable Other: No ascites or free air. The abdominal wall is normal. Musculoskeletal. No acute displaced fractures. IMPRESSION: 1. Small and large bowel obstruction that appears to be secondary to the previously demonstrated colonic mass in the ascending colon. 2. There are few mildly enlarged mesenteric lymph nodes in the right lower quadrant and right mid abdomen, concerning for nodal metastatic disease. 3. Status post right nephrectomy. 4. Atrophic left kidney. Aortic Atherosclerosis (ICD10-I70.0). Electronically Signed   By: Constance Holster M.D.   On: 08/27/2020 19:51   DG Abdomen 1 View  Result Date: 08/28/2020 CLINICAL DATA:  Nasogastric tube placement. EXAM: ABDOMEN - 1 VIEW COMPARISON:  CT abdomen and pelvis 08/27/2020 FINDINGS: The tip of the  nasogastric tube is near the GE junction. There is gas in the stomach. Gas within bowel loops in the mid abdomen and left lower abdomen. Limited evaluation for free air on the supine image. IMPRESSION: 1. Nasogastric tube tip is near the GE junction. 2. Persistent gas-filled loops of bowel in the abdomen. Findings remain compatible with a bowel obstruction. Electronically Signed   By: Markus Daft M.D.   On: 08/28/2020 07:49        Scheduled Meds: . Chlorhexidine Gluconate Cloth  6 each Topical Q0600  . dextrose  25 g Intravenous Once  . fluticasone furoate-vilanterol  1 puff Inhalation Daily  . gabapentin  300 mg Oral TID  . heparin injection (subcutaneous)  5,000 Units Subcutaneous Q8H  .  insulin aspart  0-15 Units Subcutaneous Q4H  . insulin glargine  15 Units Subcutaneous QHS  . midodrine  10 mg Oral Once in dialysis  . multivitamin  1 tablet Oral QHS  . pantoprazole (PROTONIX) IV  40 mg Intravenous Q24H   Continuous Infusions: . sodium chloride 100 mL/hr at 08/28/20 0112  . ceFEPime (MAXIPIME) IV 1 g (08/28/20 2352)  . vancomycin       LOS: 2 days      Alma Friendly, MD [facility]  08/29/2020, 2:01 PM

## 2020-08-29 NOTE — Anesthesia Postprocedure Evaluation (Signed)
Anesthesia Post Note  Patient: [name]  Procedure(s) Performed: COLONOSCOPY WITH PROPOFOL (N/A ) BIOPSY SUBMUCOSAL TATTOO INJECTION POLYPECTOMY     Patient location during evaluation: Endoscopy Anesthesia Type: General Level of consciousness: awake and alert Pain management: pain level controlled Vital Signs Assessment: post-procedure vital signs reviewed and stable Respiratory status: spontaneous breathing, nonlabored ventilation and respiratory function stable Cardiovascular status: blood pressure returned to baseline and stable Postop Assessment: no apparent nausea or vomiting Anesthetic complications: no   No complications documented.  Last Vitals:  Vitals:   08/29/20 1135 08/29/20 1150  BP: (!) 100/44 (!) 111/21  Pulse: 87 82  Resp: (!) 22 15  Temp:    SpO2: 97% 96%    Last Pain:  Vitals:   08/29/20 1150  TempSrc:   PainSc: 0-No pain                 Lidia Collum

## 2020-08-29 NOTE — Transfer of Care (Signed)
Immediate Anesthesia Transfer of Care Note  Patient: [name]  Procedure(s) Performed: COLONOSCOPY WITH PROPOFOL (N/A ) BIOPSY SUBMUCOSAL TATTOO INJECTION POLYPECTOMY  Patient Location: PACU  Anesthesia Type:General  Level of Consciousness: awake, drowsy and patient cooperative  Airway & Oxygen Therapy: Patient Spontanous Breathing  Post-op Assessment: Report given to RN and Post -op Vital signs reviewed and stable  Post vital signs: Reviewed and stable  Last Vitals:  Vitals Value Taken Time  BP 113/26 08/29/20 1127  Temp 36.7 C 08/29/20 1127  Pulse 84 08/29/20 1128  Resp 16 08/29/20 1128  SpO2 96 % 08/29/20 1128  Vitals shown include unvalidated device data.  Last Pain:  Vitals:   08/29/20 1127  TempSrc: Axillary  PainSc: Asleep         Complications: No complications documented.

## 2020-08-29 NOTE — Interval H&P Note (Signed)
History and Physical Interval Note: Patient tolerated the prep okay, abdomen feels okay, stable for colonoscopy this AM. I have discussed risks / benefits and she wishes to proceed.   08/29/2020 10:07 AM  [name]  has presented today for surgery, with the diagnosis of colon mass.  The various methods of treatment have been discussed with the patient and family. After consideration of risks, benefits and other options for treatment, the patient has consented to  Procedure(s): COLONOSCOPY WITH PROPOFOL (N/A) as a surgical intervention.  The patient's history has been reviewed, patient examined, no change in status, stable for surgery.  I have reviewed the patient's chart and labs.  Questions were answered to the patient's satisfaction.     [place]

## 2020-08-29 NOTE — Progress Notes (Signed)
Patient informed RN that she was advised by MD not to sign consent form for surgery tomorrow until tomorrow due to receiving anesthesia earlier today per colonoscopy.

## 2020-08-29 NOTE — Progress Notes (Signed)
Subjective: CC: Patient reports some right sided abdominal cramping on occasion but no pain this morning. She denies n/v. NGT clamped. She has had several liquid BM's this AM.   Objective: Vital signs in last 24 hours: Temp:  [97.6 F (36.4 C)-98.8 F (37.1 C)] 98.2 F (36.8 C) (02/08 0811) Pulse Rate:  [68-96] 83 (02/08 0811) Resp:  [13-20] 18 (02/08 0811) BP: (90-116)/(43-72) 116/72 (02/08 0811) SpO2:  [95 %-100 %] 98 % (02/08 0811) Weight:  [88.7 kg-89.1 kg] 88.7 kg (02/07 1600) Last BM Date: 08/26/20  Intake/Output from previous day: 02/07 0701 - 02/08 0700 In: 100 [IV Piggyback:100] Out: -  Intake/Output this shift: No intake/output data recorded.  PE: Gen:  Alert, NAD, pleasant Pulm: normal rate and effort  Abd: Soft, mild distension, some right sided abdominal teneress, hypoactive bowel sounds. NGT in place and clamped.  Psych: A&Ox3  Skin: no rashes noted, warm and dry    Lab Results:  Recent Labs    08/28/20 0424 08/29/20 0302  WBC 9.4 7.4  HGB 11.3* 10.6*  HCT 34.5* 33.0*  PLT 160 146*   BMET Recent Labs    08/28/20 0424 08/29/20 0303  NA 137 139  K 5.9* 4.3  CL 93* 99  CO2 24 22  GLUCOSE 143* 72  BUN 76* 41*  CREATININE 8.71* 5.74*  CALCIUM 7.7* 7.4*   PT/INR No results for input(s): LABPROT, INR in the last 72 hours. CMP     Component Value Date/Time   NA 139 08/29/2020 0303   K 4.3 08/29/2020 0303   CL 99 08/29/2020 0303   CO2 22 08/29/2020 0303   GLUCOSE 72 08/29/2020 0303   BUN 41 (H) 08/29/2020 0303   CREATININE 5.74 (H) 08/29/2020 0303   CREATININE 6.19 (H) 02/07/2020 1159   CALCIUM 7.4 (L) 08/29/2020 0303   PROT 6.3 (L) 08/28/2020 0424   ALBUMIN 2.5 (L) 08/29/2020 0303   AST 17 08/28/2020 0424   ALT 11 08/28/2020 0424   ALKPHOS 59 08/28/2020 0424   BILITOT 0.9 08/28/2020 0424   GFRNONAA 7 (L) 08/29/2020 0303   GFRNONAA 6 (L) 02/07/2020 1159   GFRAA 7 (L) 02/07/2020 1159   Lipase     Component Value Date/Time    LIPASE 33 08/27/2020 1350       Studies/Results: CT ABDOMEN PELVIS WO CONTRAST  Result Date: 08/27/2020 CLINICAL DATA:  Nephrectomy for right-sided renal cell carcinoma. Concern for bowel obstruction. EXAM: CT ABDOMEN AND PELVIS WITHOUT CONTRAST TECHNIQUE: Multidetector CT imaging of the abdomen and pelvis was performed following the standard protocol without IV contrast. COMPARISON:  June 28, 2020 FINDINGS: Lower chest: The lung bases are clear. The heart size is normal. Hepatobiliary: The liver is normal. Normal gallbladder.There is no biliary ductal dilation. Pancreas: Normal contours without ductal dilatation. No peripancreatic fluid collection. Spleen: Unremarkable. Adrenals/Urinary Tract: --Adrenal glands: Unremarkable. --Right kidney/ureter: The right kidney is surgically absent. --Left kidney/ureter: The left kidney appears atrophic. --Urinary bladder: Unremarkable. Stomach/Bowel: --Stomach/Duodenum: No hiatal hernia or other gastric abnormality. Normal duodenal course and caliber. --Small bowel: There are mildly dilated loops of small bowel scattered throughout the abdomen to the level of the patient's ileocecal valve. --Colon: There is moderate distention of the cecum. There is an above transition point at the level of the ascending colon with there is a probable underlying colonic mass. The remaining portions of the patient's colon are completely decompressed aside from the rectum. --Appendix: Normal. Vascular/Lymphatic: Atherosclerotic calcification is present within  the non-aneurysmal abdominal aorta, without hemodynamically significant stenosis. --No retroperitoneal lymphadenopathy. --there are few mildly enlarged mesenteric lymph nodes in the right lower quadrant and right mid abdomen. --No pelvic or inguinal lymphadenopathy. Reproductive: Unremarkable Other: No ascites or free air. The abdominal wall is normal. Musculoskeletal. No acute displaced fractures. IMPRESSION: 1. Small and  large bowel obstruction that appears to be secondary to the previously demonstrated colonic mass in the ascending colon. 2. There are few mildly enlarged mesenteric lymph nodes in the right lower quadrant and right mid abdomen, concerning for nodal metastatic disease. 3. Status post right nephrectomy. 4. Atrophic left kidney. Aortic Atherosclerosis (ICD10-I70.0). Electronically Signed   By: Constance Holster M.D.   On: 08/27/2020 19:51   DG Abdomen 1 View  Result Date: 08/28/2020 CLINICAL DATA:  Nasogastric tube placement. EXAM: ABDOMEN - 1 VIEW COMPARISON:  CT abdomen and pelvis 08/27/2020 FINDINGS: The tip of the nasogastric tube is near the GE junction. There is gas in the stomach. Gas within bowel loops in the mid abdomen and left lower abdomen. Limited evaluation for free air on the supine image. IMPRESSION: 1. Nasogastric tube tip is near the GE junction. 2. Persistent gas-filled loops of bowel in the abdomen. Findings remain compatible with a bowel obstruction. Electronically Signed   By: Markus Daft M.D.   On: 08/28/2020 07:49    Anti-infectives: Anti-infectives (From admission, onward)   Start     Dose/Rate Route Frequency Ordered Stop   08/28/20 1345  vancomycin (VANCOCIN) IVPB 750 mg/150 ml premix        750 mg 150 mL/hr over 60 Minutes Intravenous  Once 08/28/20 1335     08/27/20 2300  vancomycin (VANCOREADY) IVPB 2000 mg/400 mL        2,000 mg 200 mL/hr over 120 Minutes Intravenous  Once 08/27/20 2211 08/28/20 0040   08/27/20 2200  ceFEPIme (MAXIPIME) 1 g in sodium chloride 0.9 % 100 mL IVPB        1 g 200 mL/hr over 30 Minutes Intravenous Every 24 hours 08/27/20 2142     08/27/20 2130  vancomycin (VANCOCIN) IVPB 1000 mg/200 mL premix  Status:  Discontinued        1,000 mg 200 mL/hr over 60 Minutes Intravenous  Once 08/27/20 2118 08/27/20 2209   08/27/20 2130  ceFEPIme (MAXIPIME) 2 g in sodium chloride 0.9 % 100 mL IVPB  Status:  Discontinued        2 g 200 mL/hr over 30 Minutes  Intravenous  Once 08/27/20 2118 08/27/20 2139       Assessment/Plan ESRD on HD TTS Hx renal cell carcinoma; s/p partial right nephrectomy adrenalectomy 2018 Type 2 diabetes; diabetic neuropathy, diabetic retinopathy. 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 Osteo of b/l third LE digits - podiatry following GERD Hyperlipidemia Hx asthma Protein Calorie Malnutrition - Pre-alb 11.4.  Small and large bowel obstruction Possible ascending colon Mass - GI planning for colonoscopy today. Will follow up on results and bx's. - CEA pending - Further recs to follow after colonoscopy  - Cont NGT to LIWS - Mobilize, pulm toilet  FEN - NPO, NGT VTE - SCDs, Heparin subq ID - Vanc and Cefepime for osteo (per podiatry)    LOS: 2 days    Jillyn Ledger , [facility] 08/29/2020, 9:04 AM Please see Amion for pager number during day hours 7:00am-4:30pm

## 2020-08-29 NOTE — Anesthesia Preprocedure Evaluation (Addendum)
Anesthesia Evaluation  Patient identified by MRN, date of birth, ID band Patient awake    Reviewed: Allergy & Precautions, NPO status , Patient's Chart, lab work & pertinent test results  History of Anesthesia Complications Negative for: history of anesthetic complications  Airway Mallampati: II  TM Distance: >3 FB Neck ROM: Full    Dental  (+) Poor Dentition, Missing,    Pulmonary asthma ,    Pulmonary exam normal        Cardiovascular hypertension, + CAD and + Peripheral Vascular Disease  Normal cardiovascular exam  TTE 2019: mild LVH, EF 40%, diffuse hypokinesis, grade 1 DD, mild LAE/RAE   Neuro/Psych Depression negative neurological ROS     GI/Hepatic Neg liver ROS, GERD  Controlled,Colon ca   Endo/Other  diabetes, Type 2, Insulin Dependent  Renal/GU Dialysis and ESRFRenal disease  negative genitourinary   Musculoskeletal negative musculoskeletal ROS (+)   Abdominal   Peds  Hematology  (+) anemia , Hgb 10.6, plts 146k   Anesthesia Other Findings Day of surgery medications reviewed with patient.  Reproductive/Obstetrics negative OB ROS                            Anesthesia Physical Anesthesia Plan  ASA: III  Anesthesia Plan: General   Post-op Pain Management:    Induction: Intravenous  PONV Risk Score and Plan: 4 or greater and Treatment may vary due to age or medical condition, Midazolam, Ondansetron and Dexamethasone  Airway Management Planned: Oral ETT  Additional Equipment:   Intra-op Plan:   Post-operative Plan: Extubation in OR  Informed Consent: I have reviewed the patients History and Physical, chart, labs and discussed the procedure including the risks, benefits and alternatives for the proposed anesthesia with the patient or authorized representative who has indicated his/her understanding and acceptance.     Dental advisory given  Plan Discussed with:  CRNA  Anesthesia Plan Comments:        Anesthesia Quick Evaluation

## 2020-08-30 ENCOUNTER — Inpatient Hospital Stay (HOSPITAL_COMMUNITY): Payer: Medicare Other | Admitting: Anesthesiology

## 2020-08-30 ENCOUNTER — Other Ambulatory Visit: Payer: Self-pay

## 2020-08-30 ENCOUNTER — Encounter (HOSPITAL_COMMUNITY): Payer: Self-pay | Admitting: Gastroenterology

## 2020-08-30 ENCOUNTER — Encounter (HOSPITAL_COMMUNITY)
Admission: EM | Disposition: A | Discharge status: Skilled Nursing Facility | Payer: Self-pay | Source: Home / Self Care | Attending: Internal Medicine

## 2020-08-30 DIAGNOSIS — N186 End stage renal disease: Secondary | ICD-10-CM | POA: Diagnosis not present

## 2020-08-30 DIAGNOSIS — E11628 Type 2 diabetes mellitus with other skin complications: Secondary | ICD-10-CM | POA: Diagnosis not present

## 2020-08-30 DIAGNOSIS — K56600 Partial intestinal obstruction, unspecified as to cause: Secondary | ICD-10-CM | POA: Diagnosis not present

## 2020-08-30 DIAGNOSIS — K6389 Other specified diseases of intestine: Secondary | ICD-10-CM | POA: Diagnosis not present

## 2020-08-30 HISTORY — PX: PARTIAL COLECTOMY: SHX5273

## 2020-08-30 HISTORY — PX: ILEOSTOMY: SHX1783

## 2020-08-30 LAB — RENAL FUNCTION PANEL
Albumin: 2.3 g/dL — ABNORMAL LOW (ref 3.5–5.0)
Anion gap: 18 — ABNORMAL HIGH (ref 5–15)
BUN: 47 mg/dL — ABNORMAL HIGH (ref 8–23)
CO2: 21 mmol/L — ABNORMAL LOW (ref 22–32)
Calcium: 7.7 mg/dL — ABNORMAL LOW (ref 8.9–10.3)
Chloride: 103 mmol/L (ref 98–111)
Creatinine, Ser: 7.14 mg/dL — ABNORMAL HIGH (ref 0.44–1.00)
GFR, Estimated: 6 mL/min — ABNORMAL LOW (ref >60)
Glucose, Bld: 84 mg/dL (ref 70–99)
Phosphorus: 8 mg/dL — ABNORMAL HIGH (ref 2.5–4.6)
Potassium: 4 mmol/L (ref 3.5–5.1)
Sodium: 142 mmol/L (ref 135–145)

## 2020-08-30 LAB — GLUCOSE, CAPILLARY
Glucose-Capillary: 117 mg/dL — ABNORMAL HIGH (ref 70–99)
Glucose-Capillary: 135 mg/dL — ABNORMAL HIGH (ref 70–99)
Glucose-Capillary: 168 mg/dL — ABNORMAL HIGH (ref 70–99)
Glucose-Capillary: 75 mg/dL (ref 70–99)
Glucose-Capillary: 80 mg/dL (ref 70–99)
Glucose-Capillary: 84 mg/dL (ref 70–99)
Glucose-Capillary: 88 mg/dL (ref 70–99)

## 2020-08-30 LAB — POCT I-STAT, CHEM 8
BUN: 50 mg/dL — ABNORMAL HIGH (ref 8–23)
Calcium, Ion: 1.01 mmol/L — ABNORMAL LOW (ref 1.15–1.40)
Chloride: 103 mmol/L (ref 98–111)
Creatinine, Ser: 7 mg/dL — ABNORMAL HIGH (ref 0.44–1.00)
Glucose, Bld: 114 mg/dL — ABNORMAL HIGH (ref 70–99)
HCT: 24 % — ABNORMAL LOW (ref 36.0–46.0)
Hemoglobin: 8.2 g/dL — ABNORMAL LOW (ref 12.0–15.0)
Potassium: 4.1 mmol/L (ref 3.5–5.1)
Sodium: 139 mmol/L (ref 135–145)
TCO2: 20 mmol/L — ABNORMAL LOW (ref 22–32)

## 2020-08-30 LAB — CBC WITH DIFFERENTIAL/PLATELET
Abs Immature Granulocytes: 0.04 10*3/uL (ref 0.00–0.07)
Basophils Absolute: 0 10*3/uL (ref 0.0–0.1)
Basophils Relative: 0 %
Eosinophils Absolute: 0 10*3/uL (ref 0.0–0.5)
Eosinophils Relative: 1 %
HCT: 29.6 % — ABNORMAL LOW (ref 36.0–46.0)
Hemoglobin: 9.4 g/dL — ABNORMAL LOW (ref 12.0–15.0)
Immature Granulocytes: 1 %
Lymphocytes Relative: 19 %
Lymphs Abs: 1.2 10*3/uL (ref 0.7–4.0)
MCH: 31 pg (ref 26.0–34.0)
MCHC: 31.8 g/dL (ref 30.0–36.0)
MCV: 97.7 fL (ref 80.0–100.0)
Monocytes Absolute: 0.4 10*3/uL (ref 0.1–1.0)
Monocytes Relative: 6 %
Neutro Abs: 4.8 10*3/uL (ref 1.7–7.7)
Neutrophils Relative %: 73 %
Platelets: 128 10*3/uL — ABNORMAL LOW (ref 150–400)
RBC: 3.03 MIL/uL — ABNORMAL LOW (ref 3.87–5.11)
RDW: 15.3 % (ref 11.5–15.5)
WBC: 6.4 10*3/uL (ref 4.0–10.5)
nRBC: 0 % (ref 0.0–0.2)

## 2020-08-30 LAB — CEA: CEA: 3.7 ng/mL (ref 0.0–4.7)

## 2020-08-30 LAB — SURGICAL PCR SCREEN
MRSA, PCR: NEGATIVE
Staphylococcus aureus: NEGATIVE

## 2020-08-30 LAB — PREPARE RBC (CROSSMATCH)

## 2020-08-30 SURGERY — COLECTOMY, PARTIAL
Anesthesia: General | Site: Abdomen | Laterality: Right

## 2020-08-30 MED ORDER — DOXERCALCIFEROL 4 MCG/2ML IV SOLN
3.0000 ug | INTRAVENOUS | Status: DC
  Administered 2020-09-02: 3 ug via INTRAVENOUS
  Filled 2020-08-30 (×3): qty 2

## 2020-08-30 MED ORDER — CHLORHEXIDINE GLUCONATE 0.12 % MT SOLN: 15.0000 mL | OROMUCOSAL | Status: AC

## 2020-08-30 MED ORDER — PHENYLEPHRINE 40 MCG/ML (10ML) SYRINGE FOR IV PUSH (FOR BLOOD PRESSURE SUPPORT)
PREFILLED_SYRINGE | INTRAVENOUS | Status: DC | PRN
  Administered 2020-08-30 (×4): 120 ug via INTRAVENOUS
  Administered 2020-08-30: 80 ug via INTRAVENOUS

## 2020-08-30 MED ORDER — ORAL CARE MOUTH RINSE
15.0000 mL | Freq: Two times a day (BID) | OROMUCOSAL | Status: DC
  Administered 2020-08-30 – 2020-09-06 (×13): 15 mL via OROMUCOSAL

## 2020-08-30 MED ORDER — ONDANSETRON HCL 4 MG/2ML IJ SOLN
INTRAMUSCULAR | Status: AC
  Filled 2020-08-30: qty 4

## 2020-08-30 MED ORDER — OXYCODONE HCL 5 MG PO TABS
5.0000 mg | ORAL_TABLET | Freq: Once | ORAL | Status: DC | PRN
Start: 2020-08-30 — End: 2020-08-30

## 2020-08-30 MED ORDER — ROCURONIUM BROMIDE 10 MG/ML (PF) SYRINGE
PREFILLED_SYRINGE | INTRAVENOUS | Status: AC
  Filled 2020-08-30: qty 30

## 2020-08-30 MED ORDER — LIDOCAINE 2% (20 MG/ML) 5 ML SYRINGE
INTRAMUSCULAR | Status: DC | PRN
  Administered 2020-08-30: 100 mg via INTRAVENOUS

## 2020-08-30 MED ORDER — PHENYLEPHRINE HCL-NACL 10-0.9 MG/250ML-% IV SOLN
INTRAVENOUS | Status: DC | PRN
  Administered 2020-08-30: 20 ug/min via INTRAVENOUS

## 2020-08-30 MED ORDER — METHOCARBAMOL 500 MG PO TABS
1000.0000 mg | ORAL_TABLET | Freq: Three times a day (TID) | ORAL | Status: DC
  Administered 2020-08-31 – 2020-09-01 (×3): 1000 mg
  Filled 2020-08-30 (×4): qty 2

## 2020-08-30 MED ORDER — DEXAMETHASONE SODIUM PHOSPHATE 10 MG/ML IJ SOLN
INTRAMUSCULAR | Status: DC | PRN
  Administered 2020-08-30: 5 mg via INTRAVENOUS

## 2020-08-30 MED ORDER — SODIUM CHLORIDE 0.9 % IV SOLN: INTRAVENOUS | Status: DC | PRN

## 2020-08-30 MED ORDER — 0.9 % SODIUM CHLORIDE (POUR BTL) OPTIME
TOPICAL | Status: DC | PRN
  Administered 2020-08-30: 3000 mL

## 2020-08-30 MED ORDER — SUCCINYLCHOLINE CHLORIDE 200 MG/10ML IV SOSY
PREFILLED_SYRINGE | INTRAVENOUS | Status: DC | PRN
  Administered 2020-08-30: 100 mg via INTRAVENOUS

## 2020-08-30 MED ORDER — FENTANYL CITRATE (PF) 250 MCG/5ML IJ SOLN
INTRAMUSCULAR | Status: DC | PRN
  Administered 2020-08-30: 25 ug via INTRAVENOUS
  Administered 2020-08-30: 100 ug via INTRAVENOUS
  Administered 2020-08-30: 25 ug via INTRAVENOUS

## 2020-08-30 MED ORDER — DEXTROSE 5 % IV SOLN: INTRAVENOUS | Status: DC

## 2020-08-30 MED ORDER — SODIUM CHLORIDE 0.9 % IV SOLN: INTRAVENOUS | Status: DC

## 2020-08-30 MED ORDER — FENTANYL CITRATE (PF) 100 MCG/2ML IJ SOLN
25.0000 ug | INTRAMUSCULAR | Status: DC | PRN
Start: 2020-08-30 — End: 2020-08-30

## 2020-08-30 MED ORDER — GENTAMICIN SULFATE 40 MG/ML IJ SOLN
INTRAVENOUS | Status: DC | PRN
  Administered 2020-08-30: 440 mg via INTRAVENOUS

## 2020-08-30 MED ORDER — HYDROMORPHONE HCL 1 MG/ML IJ SOLN: 0.5000 mg | INTRAMUSCULAR | Status: DC | PRN

## 2020-08-30 MED ORDER — OXYCODONE HCL 5 MG/5ML PO SOLN: 5.0000 mg | Freq: Once | ORAL | Status: DC | PRN

## 2020-08-30 MED ORDER — SUCCINYLCHOLINE CHLORIDE 200 MG/10ML IV SOSY
PREFILLED_SYRINGE | INTRAVENOUS | Status: AC
  Filled 2020-08-30: qty 20

## 2020-08-30 MED ORDER — CHLORHEXIDINE GLUCONATE 0.12 % MT SOLN
15.0000 mL | Freq: Once | OROMUCOSAL | Status: AC
  Filled 2020-08-30: qty 15

## 2020-08-30 MED ORDER — EPHEDRINE 5 MG/ML INJ
INTRAVENOUS | Status: AC
  Filled 2020-08-30: qty 10

## 2020-08-30 MED ORDER — PROPOFOL 10 MG/ML IV BOLUS
INTRAVENOUS | Status: DC | PRN
  Administered 2020-08-30: 150 mg via INTRAVENOUS

## 2020-08-30 MED ORDER — SODIUM CHLORIDE 0.9 % IV SOLN: 10.0000 mL/h | Freq: Once | INTRAVENOUS | Status: DC

## 2020-08-30 MED ORDER — ACETAMINOPHEN 500 MG PO TABS
1000.0000 mg | ORAL_TABLET | Freq: Four times a day (QID) | ORAL | Status: DC
  Administered 2020-08-31 – 2020-09-01 (×3): 1000 mg
  Filled 2020-08-30 (×4): qty 2

## 2020-08-30 MED ORDER — EPHEDRINE SULFATE-NACL 50-0.9 MG/10ML-% IV SOSY
PREFILLED_SYRINGE | INTRAVENOUS | Status: DC | PRN
  Administered 2020-08-30 (×5): 10 mg via INTRAVENOUS

## 2020-08-30 MED ORDER — SUGAMMADEX SODIUM 500 MG/5ML IV SOLN
INTRAVENOUS | Status: AC
  Filled 2020-08-30: qty 5

## 2020-08-30 MED ORDER — OXYCODONE HCL 5 MG/5ML PO SOLN: 5.0000 mg | ORAL | Status: DC | PRN

## 2020-08-30 MED ORDER — PROMETHAZINE HCL 25 MG/ML IJ SOLN: 6.2500 mg | INTRAMUSCULAR | Status: DC | PRN

## 2020-08-30 MED ORDER — ALBUMIN HUMAN 5 % IV SOLN: INTRAVENOUS | Status: DC | PRN

## 2020-08-30 MED ORDER — CHLORHEXIDINE GLUCONATE 0.12 % MT SOLN
OROMUCOSAL | Status: AC
  Administered 2020-08-30: 15 mL via OROMUCOSAL
  Filled 2020-08-30: qty 15

## 2020-08-30 MED ORDER — ONDANSETRON HCL 4 MG/2ML IJ SOLN
INTRAMUSCULAR | Status: DC | PRN
  Administered 2020-08-30: 4 mg via INTRAVENOUS

## 2020-08-30 MED ORDER — PHENYLEPHRINE 40 MCG/ML (10ML) SYRINGE FOR IV PUSH (FOR BLOOD PRESSURE SUPPORT)
PREFILLED_SYRINGE | INTRAVENOUS | Status: AC
  Filled 2020-08-30: qty 20

## 2020-08-30 MED ORDER — DEXAMETHASONE SODIUM PHOSPHATE 10 MG/ML IJ SOLN
INTRAMUSCULAR | Status: AC
  Filled 2020-08-30: qty 2

## 2020-08-30 MED ORDER — ROCURONIUM BROMIDE 10 MG/ML (PF) SYRINGE
PREFILLED_SYRINGE | INTRAVENOUS | Status: DC | PRN
  Administered 2020-08-30: 50 mg via INTRAVENOUS
  Administered 2020-08-30: 30 mg via INTRAVENOUS

## 2020-08-30 MED ORDER — LIDOCAINE 2% (20 MG/ML) 5 ML SYRINGE
INTRAMUSCULAR | Status: AC
  Filled 2020-08-30: qty 10

## 2020-08-30 MED ORDER — LACTATED RINGERS IV SOLN: INTRAVENOUS | Status: DC | PRN

## 2020-08-30 MED ORDER — SUGAMMADEX SODIUM 200 MG/2ML IV SOLN
INTRAVENOUS | Status: DC | PRN
  Administered 2020-08-30: 200 mg via INTRAVENOUS

## 2020-08-30 MED ORDER — FENTANYL CITRATE (PF) 250 MCG/5ML IJ SOLN
INTRAMUSCULAR | Status: AC
  Filled 2020-08-30: qty 5

## 2020-08-30 MED ORDER — VANCOMYCIN VARIABLE DOSE PER UNSTABLE RENAL FUNCTION (PHARMACIST DOSING)
Status: DC
  Filled 2020-08-30: qty 1

## 2020-08-30 SURGICAL SUPPLY — 43 items
BLADE CLIPPER SURG (BLADE) IMPLANT
CANISTER SUCT 3000ML PPV (MISCELLANEOUS) ×3 IMPLANT
COVER SURGICAL LIGHT HANDLE (MISCELLANEOUS) ×3 IMPLANT
COVER WAND RF STERILE (DRAPES) ×3 IMPLANT
DRSG OPSITE POSTOP 4X10 (GAUZE/BANDAGES/DRESSINGS) ×1 IMPLANT
DRSG OPSITE POSTOP 4X8 (GAUZE/BANDAGES/DRESSINGS) IMPLANT
ELECT CAUTERY BLADE 6.4 (BLADE) ×3 IMPLANT
ELECT REM PT RETURN 9FT ADLT (ELECTROSURGICAL) ×3
ELECTRODE REM PT RTRN 9FT ADLT (ELECTROSURGICAL) ×2 IMPLANT
GLOVE BIO SURGEON STRL SZ8 (GLOVE) ×6 IMPLANT
GLOVE SRG 8 PF TXTR STRL LF DI (GLOVE) ×4 IMPLANT
GLOVE SURG UNDER POLY LF SZ8 (GLOVE) ×6
GOWN STRL REUS W/ TWL LRG LVL3 (GOWN DISPOSABLE) ×8 IMPLANT
GOWN STRL REUS W/ TWL XL LVL3 (GOWN DISPOSABLE) ×4 IMPLANT
GOWN STRL REUS W/TWL LRG LVL3 (GOWN DISPOSABLE) ×12
GOWN STRL REUS W/TWL XL LVL3 (GOWN DISPOSABLE) ×6
KIT OSTOMY DRAINABLE 2.75 STR (WOUND CARE) ×1 IMPLANT
KIT SIGMOIDOSCOPE (SET/KITS/TRAYS/PACK) IMPLANT
KIT TURNOVER KIT B (KITS) ×3 IMPLANT
LIGASURE IMPACT 36 18CM CVD LR (INSTRUMENTS) ×3 IMPLANT
NS IRRIG 1000ML POUR BTL (IV SOLUTION) ×6 IMPLANT
PACK COLON (CUSTOM PROCEDURE TRAY) ×3 IMPLANT
PAD ARMBOARD 7.5X6 YLW CONV (MISCELLANEOUS) ×6 IMPLANT
PENCIL SMOKE EVACUATOR (MISCELLANEOUS) ×3 IMPLANT
RELOAD PROXIMATE 75MM BLUE (ENDOMECHANICALS) ×3 IMPLANT
RELOAD STAPLE 75 3.8 BLU REG (ENDOMECHANICALS) IMPLANT
RETRACTOR WND ALEXIS 25 LRG (MISCELLANEOUS) IMPLANT
RTRCTR WOUND ALEXIS 25CM LRG (MISCELLANEOUS) ×3
SPECIMEN JAR X LARGE (MISCELLANEOUS) ×3 IMPLANT
SPONGE LAP 18X18 RF (DISPOSABLE) IMPLANT
STAPLER PROXIMATE 75MM BLUE (STAPLE) ×1 IMPLANT
STAPLER VISISTAT 35W (STAPLE) ×3 IMPLANT
SURGILUBE 2OZ TUBE FLIPTOP (MISCELLANEOUS) IMPLANT
SUT PDS AB 1 TP1 96 (SUTURE) ×6 IMPLANT
SUT PROLENE 2 0 CT2 30 (SUTURE) ×1 IMPLANT
SUT PROLENE 2 0 KS (SUTURE) IMPLANT
SUT SILK 0 SH 30 (SUTURE) ×1 IMPLANT
SUT SILK 2 0 SH CR/8 (SUTURE) ×3 IMPLANT
SUT SILK 2 0 TIES 10X30 (SUTURE) ×3 IMPLANT
SUT SILK 3 0 SH CR/8 (SUTURE) ×3 IMPLANT
SUT SILK 3 0 TIES 10X30 (SUTURE) ×3 IMPLANT
SUT VIC AB 3-0 SH 18 (SUTURE) ×2 IMPLANT
TUBE CONNECTING 12X1/4 (SUCTIONS) ×6 IMPLANT

## 2020-08-30 NOTE — Progress Notes (Signed)
General Surgery Follow Up Note  Subjective:    Overnight Issues:   Objective:  Vital signs for last 24 hours: Temp:  [98.4 F (36.9 C)-98.7 F (37.1 C)] 98.6 F (37 C) (02/09 0925) Pulse Rate:  [81-86] 81 (02/09 0925) Resp:  [16-18] 18 (02/09 0925) BP: (116-130)/(46-61) 130/61 (02/09 0925) SpO2:  [95 %-97 %] 95 % (02/09 0925) Weight:  [88.7 kg] 88.7 kg (02/09 1211)  Hemodynamic parameters for last 24 hours:    Intake/Output from previous day: 02/08 0701 - 02/09 0700 In: 1868.6 [I.V.:1617.3; IV Piggyback:251.3] Out: 151 [Urine:50; Emesis/NG output:100; Stool:1]  Intake/Output this shift: No intake/output data recorded.  Vent settings for last 24 hours:    Physical Exam:  Gen: comfortable, no distress Neuro: non-focal exam HEENT: PERRL Neck: supple CV: RRR Pulm: unlabored breathing Abd: soft, NT GU: clear yellow urine Extr: wwp, no edema   Results for orders placed or performed during the hospital encounter of 08/27/20 (from the past 24 hour(s))  Glucose, capillary     Status: None   Collection Time: 08/29/20  3:54 PM  Result Value Ref Range   Glucose-Capillary 89 70 - 99 mg/dL  Glucose, capillary     Status: None   Collection Time: 08/29/20  9:16 PM  Result Value Ref Range   Glucose-Capillary 85 70 - 99 mg/dL   Comment 1 Notify RN    Comment 2 Document in Chart   Surgical PCR screen     Status: None   Collection Time: 08/29/20 10:13 PM   Specimen: Nasal Mucosa; Nasal Swab  Result Value Ref Range   MRSA, PCR NEGATIVE NEGATIVE   Staphylococcus aureus NEGATIVE NEGATIVE  Glucose, capillary     Status: None   Collection Time: 08/30/20 12:37 AM  Result Value Ref Range   Glucose-Capillary 75 70 - 99 mg/dL   Comment 1 Notify RN    Comment 2 Document in Chart   CBC with Differential/Platelet     Status: Abnormal   Collection Time: 08/30/20  2:52 AM  Result Value Ref Range   WBC 6.4 4.0 - 10.5 K/uL   RBC 3.03 (L) 3.87 - 5.11 MIL/uL   Hemoglobin 9.4 (L)  12.0 - 15.0 g/dL   HCT 29.6 (L) 36.0 - 46.0 %   MCV 97.7 80.0 - 100.0 fL   MCH 31.0 26.0 - 34.0 pg   MCHC 31.8 30.0 - 36.0 g/dL   RDW 15.3 11.5 - 15.5 %   Platelets 128 (L) 150 - 400 K/uL   nRBC 0.0 0.0 - 0.2 %   Neutrophils Relative % 73 %   Neutro Abs 4.8 1.7 - 7.7 K/uL   Lymphocytes Relative 19 %   Lymphs Abs 1.2 0.7 - 4.0 K/uL   Monocytes Relative 6 %   Monocytes Absolute 0.4 0.1 - 1.0 K/uL   Eosinophils Relative 1 %   Eosinophils Absolute 0.0 0.0 - 0.5 K/uL   Basophils Relative 0 %   Basophils Absolute 0.0 0.0 - 0.1 K/uL   Immature Granulocytes 1 %   Abs Immature Granulocytes 0.04 0.00 - 0.07 K/uL  Renal function panel     Status: Abnormal   Collection Time: 08/30/20  2:52 AM  Result Value Ref Range   Sodium 142 135 - 145 mmol/L   Potassium 4.0 3.5 - 5.1 mmol/L   Chloride 103 98 - 111 mmol/L   CO2 21 (L) 22 - 32 mmol/L   Glucose, Bld 84 70 - 99 mg/dL   BUN 47 (H)  8 - 23 mg/dL   Creatinine, Ser 7.14 (H) 0.44 - 1.00 mg/dL   Calcium 7.7 (L) 8.9 - 10.3 mg/dL   Phosphorus 8.0 (H) 2.5 - 4.6 mg/dL   Albumin 2.3 (L) 3.5 - 5.0 g/dL   GFR, Estimated 6 (L) >60 mL/min   Anion gap 18 (H) 5 - 15  Glucose, capillary     Status: None   Collection Time: 08/30/20  5:01 AM  Result Value Ref Range   Glucose-Capillary 84 70 - 99 mg/dL   Comment 1 Notify RN    Comment 2 Document in Chart   Glucose, capillary     Status: None   Collection Time: 08/30/20  7:25 AM  Result Value Ref Range   Glucose-Capillary 80 70 - 99 mg/dL  Type and screen [place]     Status: None   Collection Time: 08/30/20  8:14 AM  Result Value Ref Range   ABO/RH(D) O POS    Antibody Screen NEG    Sample Expiration      09/02/2020,2359 Performed at [facility], [place] [address], [place], [place] [address]   Glucose, capillary     Status: None   Collection Time: 08/30/20 11:25 AM  Result Value Ref Range   Glucose-Capillary 88 70 - 99 mg/dL    Assessment & Plan:  Present on  Admission: . Bowel obstruction ([place]) . Unspecified asthma, uncomplicated . Type 2 diabetes, controlled, with neuropathy ([place]) . Nonproliferative retinopathy due to secondary diabetes ([place]) . Hyperlipidemia . End stage renal disease ([place]) . Anemia in chronic kidney disease . Cellulitis in diabetic foot ([place])    LOS: 3 days   Additional comments:I reviewed the patient's new clinical lab test results.   and I reviewed the patients new imaging test results.    ESRD on HD TTS Hx renal cell carcinoma; s/p partial right nephrectomy adrenalectomy 2018 Type 2 diabetes; diabetic neuropathy, diabetic retinopathy. 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 Osteo of b/l third LE digits - podiatry following GERD Hyperlipidemia Hx asthma Protein Calorie Malnutrition - Pre-alb 11.4.  Small and large bowel obstruction Ascendingcolonmass confirmed on colonoscopy, mostly obstructing - Plan for R hemicolectomy today with probable ileostomy creation. Discussed with patient the increased risk of performing an anastomosis and she verbalizes understanding. Discussed option for reversal after chemo if path ultimately results as malignant. Discussed risks of surgery, including bleeding infection, and injury to surrounding structures; patient is s/p R radical nephrectomy and ESRD on HD. Informed consent obtained.  - CEA 3.7 - Cont NGT to LIWS - Mobilize, pulm toilet  FEN - NPO, NGT VTE - SCDs, Heparin subq ID - Vanc and Cefepime for osteo (per podiatry)   Jesusita Oka, MD Trauma & General Surgery Please use [email or web address] to contact on call provider  08/30/2020  *Care during the described time interval was provided by me. I have reviewed this patient's available data, including medical history, events of note, physical examination and test results as part of my evaluation.

## 2020-08-30 NOTE — Progress Notes (Signed)
Patient ID: [name], female   DOB: 02-Aug-1950, 70 y.o.   MRN: [id number]  PROGRESS NOTE    [name]  BJS:[id number] DOB: May 01, 1951 DOA: 08/27/2020 PCP: [name], MD   Brief Narrative:  70 year old female history of asthma, anemia, renal cell carcinoma status post nephrectomy, type 2 diabetes mellitus, end-stage renal disease on hemodialysis Tuesday Thursday Saturday, peripheral vascular disease, hypertension, hyperlipidemia, GERD, diabetic retinopathy as well as neuropathy presented to the ED with acute abdominal pain, intractable nausea and vomiting to liquids and solids. Patient noted to have missed hemodialysis on Tuesday and Saturday prior to admission.  Patient also noted to have been scheduled for amputation of the right third toe per podiatry.  In the ED: work-up with CT abdomen and pelvis revealed small and large bowel obstruction secondary to previously demonstrated colonic mass in the ascending colon. NG tube placed.  Nephrology consulted.  GI consulted as well as general surgery. Pt admitted for further management    Assessment & Plan:   Small and lateral bowel obstruction secondary right ascending colon mass -Status post colonoscopy on 08/29/2020 by GI which showed likely malignant tumor, biopsied, removed for 3 to 8 mm polyps, pathology is pending -General surgery planning for surgical intervention today. -CT of the chest without contrast showed lingular nodule, slowly enlarging and recommended surveillance -Currently on gentle hydration.  N.p.o. and has NG tube  End-stage renal disease on hemodialysis -Dialysis as per nephrology schedule.  Nephrology following  Diabetes mellitus type 2 with diabetic neuropathy and retinopathy -A1c 7.8.  Blood sugars on the lower side.  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  No signs of bleeding  Hyperlipidemia -Resume statin when no longer n.p.o  Asthma without exacerbation -Continue home regimen Breo Ellipta and albuterol inhaler.   DVT prophylaxis: Heparin Code Status: Full Family Communication: None at bedside Disposition Plan: Status is: Inpatient  Remains inpatient appropriate because:Hemodynamically unstable and Inpatient level of care appropriate due to severity of illness   Dispo: The patient is from: Home              Anticipated d/c is to: Home              Anticipated d/c date is: > 3 days              Patient currently is not medically stable to d/c.   Difficult to place patient No   Consultants: GI/general surgery/nephrology  Procedures: Colonoscopy on 08/29/2020  Antimicrobials: Vancomycin and cefepime from 08/27/2020 onwards   Subjective: Patient seen and examined at bedside.  Denies worsening abdominal pain, fever, vomiting or worsening shortness of breath.  Objective: Vitals:   08/29/20 2114 08/30/20 0452 08/30/20 0745 08/30/20 0925  BP: (!) 123/58 (!) 121/46  130/61  Pulse: 85 86  81  Resp: 16 17  18   Temp: 98.7 F (37.1 C) 98.4 F (36.9 C)  98.6 F (37 C)  TempSrc: Oral   Oral  SpO2: 97% 96% 95% 95%  Weight:      Height:        Intake/Output Summary (Last 24 hours) at 08/30/2020 1050 Last data filed at 08/30/2020 0400 Gross per 24 hour  Intake 1868.6 ml  Output 150 ml  Net 1718.6 ml   [employer]  08/27/20 1331 08/28/20 1220 08/28/20 1600  Weight: 88.5 kg 89.1 kg 88.7 kg    Examination:  General exam: Appears calm and comfortable.  Looks chronically ill.  Currently on room air. ENT: NG tube in place Respiratory system: Bilateral decreased breath sounds at bases with some scattered crackles Cardiovascular system: S1 & S2 heard, Rate controlled Gastrointestinal system: Abdomen is slightly  distended, soft and mildly tender in the lower quadrant.  Bowel sounds sluggish  extremities: No cyanosis, clubbing; trace lower extremity edema Central nervous system: Alert and oriented. No focal neurological deficits. Moving extremities Skin: No rashes, lesions or ulcers Psychiatry: Flat affect    Data Reviewed: I have personally reviewed following labs and imaging studies  CBC: Recent Labs  Lab 08/27/20 1350 08/28/20 0424 08/29/20 0302 08/30/20 0252  WBC 10.7* 9.4 7.4 6.4  NEUTROABS  --   --   --  4.8  HGB 12.8 11.3* 10.6* 9.4*  HCT 38.4 34.5* 33.0* 29.6*  MCV 94.1 95.6 96.2 97.7  PLT 204 160 146* 671*   Basic Metabolic Panel: Recent Labs  Lab 08/27/20 1350 08/28/20 0424 08/29/20 0302 08/29/20 0303 08/30/20 0252  NA 137 137  --  139 142  K 4.9 5.9*  --  4.3 4.0  CL 93* 93*  --  99 103  CO2 22 24  --  22 21*  GLUCOSE 260* 143*  --  72 84  BUN 71* 76*  --  41* 47*  CREATININE 8.79* 8.71*  --  5.74* 7.14*  CALCIUM 8.2* 7.7*  --  7.4* 7.7*  MG  --   --  1.8  --   --   PHOS  --   --   --  6.3* 8.0*   GFR: Estimated Creatinine Clearance: 8.5 mL/min (A) (by C-G formula based on SCr of 7.14 mg/dL (H)). Liver Function Tests: Recent Labs  Lab 08/27/20 1350 08/28/20 0424 08/29/20 0303 08/30/20 0252  AST 20 17  --   --   ALT 12 11  --   --   ALKPHOS 72 59  --   --   BILITOT 1.1 0.9  --   --   PROT 7.4 6.3*  --   --   ALBUMIN 3.5 2.9* 2.5* 2.3*   Recent Labs  Lab 08/27/20 1350  LIPASE 33   No results for input(s): AMMONIA in the last 168 hours. Coagulation Profile: No results for input(s): INR, PROTIME in the last 168 hours. Cardiac Enzymes: No results for input(s): CKTOTAL, CKMB, CKMBINDEX, TROPONINI in the last 168 hours. BNP (last 3 results) No results for input(s): PROBNP in the last 8760 hours. HbA1C: Recent Labs    08/28/20 0002  HGBA1C 7.8*   CBG: Recent Labs  Lab 08/29/20 1554 08/29/20 2116 08/30/20 0037 08/30/20 0501 08/30/20 0725   GLUCAP 89 85 75 84 80   Lipid Profile: No results for input(s): CHOL, HDL, LDLCALC, TRIG, CHOLHDL, LDLDIRECT in the last 72 hours. Thyroid Function Tests: No results for input(s): TSH, T4TOTAL, FREET4, T3FREE, THYROIDAB in the last 72 hours. Anemia Panel: No results for input(s): VITAMINB12, FOLATE, FERRITIN, TIBC, IRON, RETICCTPCT in the last 72 hours. Sepsis Labs: No results for input(s): PROCALCITON, LATICACIDVEN in the last 168 hours.  Recent Results (from the past 240 hour(s))  SARS Coronavirus 2 by RT PCR (hospital order, performed in [facility] hospital lab) Nasopharyngeal Nasopharyngeal Swab     Status: None   Collection Time: 08/27/20  9:46 PM   Specimen: Nasopharyngeal Swab  Result Value Ref Range Status   SARS Coronavirus 2 NEGATIVE NEGATIVE Final    Comment: (NOTE) SARS-CoV-2 target nucleic acids are NOT DETECTED.  The SARS-CoV-2 RNA is generally detectable in upper and lower respiratory specimens during the acute phase of infection. The lowest concentration of SARS-CoV-2 viral copies this assay can detect is 250 copies / mL. A negative result does not preclude SARS-CoV-2 infection and should not be used as the sole basis for treatment or other patient management decisions.  A negative result may occur with improper specimen collection / handling, submission of specimen other than nasopharyngeal swab, presence of viral mutation(s) within the areas targeted by this assay, and inadequate number of viral copies (<250 copies / mL). A negative result must be combined with clinical observations, patient history, and epidemiological information.  Fact Sheet for Patients:   StrictlyIdeas.no  Fact Sheet for Healthcare Providers: BankingDealers.co.za  This test is not yet approved or  cleared by the [place] FDA and has been authorized for detection and/or diagnosis of SARS-CoV-2 by FDA under an Emergency Use  Authorization (EUA).  This EUA will remain in effect (meaning this test can be used) for the duration of the COVID-19 declaration under Section 564(b)(1) of the Act, 21 U.S.C. section 360bbb-3(b)(1), unless the authorization is terminated or revoked sooner.  Performed at [facility], [place] [address] [place], [place] [address]   Surgical PCR screen     Status: None   Collection Time: 08/29/20 10:13 PM   Specimen: Nasal Mucosa; Nasal Swab  Result Value Ref Range Status   MRSA, PCR NEGATIVE NEGATIVE Final   Staphylococcus aureus NEGATIVE NEGATIVE Final    Comment: (NOTE) The Xpert SA Assay (FDA approved for NASAL specimens in patients 24 years of age and older), is one component of a comprehensive surveillance program. It is not intended to diagnose infection nor to guide or monitor treatment. Performed at [facility], [place] [address] [place], [place] [address]          Radiology Studies: CT CHEST W CONTRAST  Result Date: 08/29/2020 CLINICAL DATA:  Staging workup of ascending colon cancer. Prior right renal cell carcinoma. EXAM: CT CHEST WITH CONTRAST TECHNIQUE: Multidetector CT imaging of the chest was performed during intravenous contrast administration. CONTRAST:  109mL OMNIPAQUE IOHEXOL 300 MG/ML  SOLN COMPARISON:  CT abdomen 08/27/2020 and CT chest 06/28/2020 FINDINGS: Cardiovascular: Coronary, aortic arch, and branch vessel atherosclerotic vascular disease. Mildly prominent right ventricle and overall mild cardiomegaly. Mitral and aortic valve calcifications. Mediastinum/Nodes: Lower paratracheal node anterior to the carina measures 2.1 cm in short axis on image 57 series 3, previously the same by my measurements. Right hilar lymph node measures 1.2 cm in short axis on image 62 of series 3, previously likely similar on 06/28/2020 although difficult to measure on that exam due to the lack of IV contrast. A nasogastric tube is present and terminates within a  type 1 hiatal hernia. Lungs/Pleura: Small right and trace left pleural effusions. Mild scarring or atelectasis in the lingula and lower lobes. 4 by 5 mm lingular nodule on image 82 series 4, measuring 0.3 cm in diameter on 11/04/2018 and 05/13/2019 and 0.4 by 0.3 cm on 12/01/2019. Accordingly this nodule is slowly enlarging and merits surveillance, but is still too small for PET CT characterization. Upper Abdomen: Perihepatic ascites. Left adrenalectomy. Atherosclerosis. Musculoskeletal: Degenerative glenohumeral arthropathy bilaterally. Thoracic spondylosis. IMPRESSION: 1. 4 by 5 mm lingular nodule, slowly enlarging over the last 2 years, but  still too small for PET CT characterization. This could be inflammatory or neoplastic. Surveillance is likely warranted. 2. Stable paratracheal and right hilar adenopathy. 3. Small right and trace left pleural effusions. 4. Other imaging findings of potential clinical significance: Coronary, aortic arch, and branch vessel atherosclerotic vascular disease. Mildly prominent right ventricle and overall mild cardiomegaly. Mitral and aortic valve calcifications. Perihepatic ascites. Left adrenalectomy. Degenerative glenohumeral arthropathy bilaterally. 5. Aortic atherosclerosis. Aortic Atherosclerosis (ICD10-I70.0). Electronically Signed   By: Van Clines M.D.   On: 08/29/2020 19:22        Scheduled Meds: . Chlorhexidine Gluconate Cloth  6 each Topical Q0600  . Chlorhexidine Gluconate Cloth  6 each Topical Once  . fluticasone furoate-vilanterol  1 puff Inhalation Daily  . gabapentin  300 mg Oral TID  . heparin injection (subcutaneous)  5,000 Units Subcutaneous Q8H  . insulin aspart  0-15 Units Subcutaneous Q4H  . mouth rinse  15 mL Mouth Rinse BID  . midodrine  10 mg Oral Once in dialysis  . multivitamin  1 tablet Oral QHS  . mupirocin ointment  1 application Nasal BID  . pantoprazole (PROTONIX) IV  40 mg Intravenous Q24H   Continuous Infusions: .  ceFEPime (MAXIPIME) IV 1 g (08/29/20 2128)  . clindamycin (CLEOCIN) IV     And  . gentamicin    . dextrose 50 mL/hr at 08/30/20 0153          Aline August, MD [facility] 08/30/2020, 10:50 AM

## 2020-08-30 NOTE — Anesthesia Procedure Notes (Signed)
Procedure Name: Intubation Date/Time: 08/30/2020 1:13 PM Performed by: Thelma Comp, CRNA Pre-anesthesia Checklist: Patient identified, Emergency Drugs available, Suction available and Patient being monitored Patient Re-evaluated:Patient Re-evaluated prior to induction Oxygen Delivery Method: Circle System Utilized Preoxygenation: Pre-oxygenation with 100% oxygen Induction Type: IV induction, Rapid sequence and Cricoid Pressure applied Ventilation: Mask ventilation without difficulty Laryngoscope Size: Mac and 3 Grade View: Grade I Tube type: Oral Tube size: 7.0 mm Number of attempts: 1 Airway Equipment and Method: Stylet and Oral airway Placement Confirmation: ETT inserted through vocal cords under direct vision,  positive ETCO2 and breath sounds checked- equal and bilateral Secured at: 22 cm Tube secured with: Tape Dental Injury: Teeth and Oropharynx as per pre-operative assessment

## 2020-08-30 NOTE — Progress Notes (Signed)
GI UPDATE:  Went to see the patient this afternoon, she is in the OR having R hemicolectomy. Dr. Saralyn Pilar from Pathology called me, results c/w adenocarcinoma of the colon. Polyps were tubular adenomas. Hopefully she recovers well from surgery, we will try to stop by later. Call with questions. Will be otherwise following peripherally for now.  Stratford Cellar, MD [facility]

## 2020-08-30 NOTE — Anesthesia Postprocedure Evaluation (Signed)
Anesthesia Post Note  Patient: [name]  Procedure(s) Performed: HEMI COLECTOMY (N/A Abdomen) ILEOSTOMY (Right Abdomen)     Patient location during evaluation: PACU Anesthesia Type: General Level of consciousness: awake and alert and oriented Pain management: pain level controlled Vital Signs Assessment: post-procedure vital signs reviewed and stable Respiratory status: spontaneous breathing, nonlabored ventilation, respiratory function stable and patient connected to nasal cannula oxygen Cardiovascular status: blood pressure returned to baseline and stable Postop Assessment: no apparent nausea or vomiting Anesthetic complications: no   No complications documented.                Syleena Mchan A.

## 2020-08-30 NOTE — Progress Notes (Addendum)
Subjectiv e: No current complaints awaiting abdominal surgery, okay to have hemodialysis tomorrow instead of today  Objective level surgery Vital signs in last 24 hours: Vitals:   08/29/20 2114 08/30/20 0452 08/30/20 0745 08/30/20 0925  BP: (!) 123/58 (!) 121/46  130/61  Pulse: 85 86  81  Resp: 16 17  18   Temp: 98.7 F (37.1 C) 98.4 F (36.9 C)  98.6 F (37 C)  TempSrc: Oral   Oral  SpO2: 97% 96% 95% 95%  Weight:      Height:       Weight change:   Physical Exam: General: Alert calm ,chronically ill female, NG tube in place NAD Heart: RRR, no MRG Lungs: Decreased breath sounds at bases otherwise CTA nonlabored breathing Abdomen: Hypoactive BS, NT in place clamped soft minimal distention with no tenderness this a.m. Extremities: No pedal edema Dialysis Access: LUA AVF positive bruit    OP HD:[facility] 3h 36min 400/500 87kg 2/2.25 bath P2 LUA AVF Hep 5000+ 1000 midrun - mircera 100ug last 1/11 - hect 3 ug  Problem/Plan: 1. ESRD -(normal TTS )  HD this week off schedule. secondary to procedures and numerous emergent patients a.m. labs = K  4.0 BUN 47 creatinine 7.14, volume okay, for abdominal surgery today okay to have dialysis tomorrow thurs  Nl scd.  On gentle IV hydration 50 mL/h 2. SBO - appears to be secondary to malignancy.  Right ascending colon mass status post colonoscopy 2/08 showing "likely malignant tumor was biopsied, removed 3 to 8 mm polyps ", general surgery planning surgical intervention today  3. Hypertension/ volume= uses midodrine for BP control dialysis, appears euvolemic/has been n.p.o. 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 Last op Phos on 08/24/20 was 9.3.  Follow-up trend.  Hectorol on dialysis 3 mics 5. Anemia -Hgb 9.4 this a.m.@ goal. Will hold ESA's for now with probable  malignancy, usually on Mircera 100 (last given 08/01/2020) 6. DM 7. PAD -was scheduled for possible amputation of the right 3rd toelater , on vancomycin and cefepime per admit 8. Asthma without exasperation on meds per admit   Ernest Haber, PA-C Josephine [phone] 08/30/2020,11:38 AM  LOS: 3 days   Labs: Basic Metabolic Panel: Recent Labs  Lab 08/28/20 0424 08/29/20 0303 08/30/20 0252  NA 137 139 142  K 5.9* 4.3 4.0  CL 93* 99 103  CO2 24 22 21*  GLUCOSE 143* 72 84  BUN 76* 41* 47*  CREATININE 8.71* 5.74* 7.14*  CALCIUM 7.7* 7.4* 7.7*  PHOS  --  6.3* 8.0*   Liver Function Tests: Recent Labs  Lab 08/27/20 1350 08/28/20 0424 08/29/20 0303 08/30/20 0252  AST 20 17  --   --   ALT 12 11  --   --   ALKPHOS 72 59  --   --   BILITOT 1.1 0.9  --   --   PROT 7.4 6.3*  --   --   ALBUMIN 3.5 2.9* 2.5* 2.3*   Recent Labs  Lab 08/27/20 1350  LIPASE 33   No results for input(s): AMMONIA in the last 168 hours. CBC: Recent Labs  Lab 08/27/20 1350 08/28/20 0424 08/29/20 0302 08/30/20 0252  WBC 10.7* 9.4 7.4 6.4  NEUTROABS  --   --   --  4.8  HGB 12.8 11.3* 10.6*  9.4*  HCT 38.4 34.5* 33.0* 29.6*  MCV 94.1 95.6 96.2 97.7  PLT 204 160 146* 128*   Cardiac Enzymes: No results for input(s): CKTOTAL, CKMB, CKMBINDEX, TROPONINI in the last 168 hours. CBG: Recent Labs  Lab 08/29/20 1554 08/29/20 2116 08/30/20 0037 08/30/20 0501 08/30/20 0725  GLUCAP 89 85 75 84 80    Studies/Results: CT CHEST W CONTRAST  Result Date: 08/29/2020 CLINICAL DATA:  Staging workup of ascending colon cancer. Prior right renal cell carcinoma. EXAM: CT CHEST WITH CONTRAST TECHNIQUE: Multidetector CT imaging of the chest was performed during intravenous contrast administration. CONTRAST:  21mL OMNIPAQUE IOHEXOL 300 MG/ML  SOLN COMPARISON:  CT abdomen 08/27/2020 and CT chest 06/28/2020 FINDINGS: Cardiovascular: Coronary, aortic arch, and branch vessel atherosclerotic  vascular disease. Mildly prominent right ventricle and overall mild cardiomegaly. Mitral and aortic valve calcifications. Mediastinum/Nodes: Lower paratracheal node anterior to the carina measures 2.1 cm in short axis on image 57 series 3, previously the same by my measurements. Right hilar lymph node measures 1.2 cm in short axis on image 62 of series 3, previously likely similar on 06/28/2020 although difficult to measure on that exam due to the lack of IV contrast. A nasogastric tube is present and terminates within a type 1 hiatal hernia. Lungs/Pleura: Small right and trace left pleural effusions. Mild scarring or atelectasis in the lingula and lower lobes. 4 by 5 mm lingular nodule on image 82 series 4, measuring 0.3 cm in diameter on 11/04/2018 and 05/13/2019 and 0.4 by 0.3 cm on 12/01/2019. Accordingly this nodule is slowly enlarging and merits surveillance, but is still too small for PET CT characterization. Upper Abdomen: Perihepatic ascites. Left adrenalectomy. Atherosclerosis. Musculoskeletal: Degenerative glenohumeral arthropathy bilaterally. Thoracic spondylosis. IMPRESSION: 1. 4 by 5 mm lingular nodule, slowly enlarging over the last 2 years, but still too small for PET CT characterization. This could be inflammatory or neoplastic. Surveillance is likely warranted. 2. Stable paratracheal and right hilar adenopathy. 3. Small right and trace left pleural effusions. 4. Other imaging findings of potential clinical significance: Coronary, aortic arch, and branch vessel atherosclerotic vascular disease. Mildly prominent right ventricle and overall mild cardiomegaly. Mitral and aortic valve calcifications. Perihepatic ascites. Left adrenalectomy. Degenerative glenohumeral arthropathy bilaterally. 5. Aortic atherosclerosis. Aortic Atherosclerosis (ICD10-I70.0). Electronically Signed   By: Van Clines M.D.   On: 08/29/2020 19:22   Medications: . ceFEPime (MAXIPIME) IV 1 g (08/29/20 2128)  .  clindamycin (CLEOCIN) IV     And  . gentamicin 440 mg (08/30/20 1126)  . dextrose 50 mL/hr at 08/30/20 1130   . Chlorhexidine Gluconate Cloth  6 each Topical Q0600  . Chlorhexidine Gluconate Cloth  6 each Topical Once  . fluticasone furoate-vilanterol  1 puff Inhalation Daily  . gabapentin  300 mg Oral TID  . heparin injection (subcutaneous)  5,000 Units Subcutaneous Q8H  . insulin aspart  0-15 Units Subcutaneous Q4H  . mouth rinse  15 mL Mouth Rinse BID  . midodrine  10 mg Oral Once in dialysis  . multivitamin  1 tablet Oral QHS  . mupirocin ointment  1 application Nasal BID  . pantoprazole (PROTONIX) IV  40 mg Intravenous Q24H

## 2020-08-30 NOTE — Consult Note (Addendum)
[name] Nurse requested for preoperative stoma site marking  Discussed surgical procedure and stoma creation with patient. Explained role of the [facility] nurse team.  Provided the patient with educational booklet and provided samples of pouching options.  Answered patient's questions.   Examined patient lying and sitting upright in bed, in order to place the marking in the patient's visual field, away from any creases or abdominal contour issues and within the rectus muscle.  Attempted to mark below the patient's belt line, but this was not possible since a significant crease appears lower on the abd when the patient is sitting up and should be avoided if possible.   Marked for colostomy in the LLQ  _8___ cm to the left of the umbilicus and _1.1___DB above the umbilicus.  Marked for ileostomy in the RLQ  __8__cm to the right of the umbilicus and  __5.2__ cm above the umbilicus.  Patient's abdomen cleansed with CHG wipes at site markings, allowed to air dry prior to marking. Pt plans to go to surgery this morning [facility] Nurse team will follow up with patient after surgery for continued ostomy care and teaching if patient receives a stoma.  [name] Girt MSN, RN, [place], [place], [place]

## 2020-08-30 NOTE — Op Note (Signed)
   Operative Note   Date: 08/30/2020  Procedure: right hemicolectomy, ileostomy creation  Pre-op diagnosis: nearly obstructing colon mass, probably malignancy Post-op diagnosis: same  Indication and clinical history: The patient is a 70 y.o. year old female with a nearly obstructing colon mass, probably malignancy. S/p colonoscopic biopsy with pending path.   Surgeon: Jesusita Oka, MD Assistant: Marcello Moores, MD  Anesthesiologist: Daiva Huge, MD Anesthesia: General  Findings:  . Specimen: R colon . EBL: 750cc . Drains/Implants: none  Disposition: PACU - hemodynamically stable.  Description of procedure: The patient was positioned supine on the operating room table. General anesthetic induction and intubation were uneventful. Foley catheter insertion was not performed as the patient is ESRD on HD. Time-out was performed verifying correct patient, procedure, signature of informed consent, and administration of pre-operative antibiotics. The abdomen was prepped and draped in the usual sterile fashion.  A midline incision was made and deepened through the fascia until the peritoneal cavity was reached. The dissection began in the right lower quadrant and with mobilization of the cecum and taking the dissection cephalad. Once the area of the ascending colon was reached where it was evident the tumor was located, the mass was adherent to the retroperitoneum. At this point, The transverse colon was dissected off the omentum and dissection taken proximally. Ultimately, through a combination of electrocautery and blunt dissection, the area of colon and tumor was resected with a rim of retroperitoneum. Once freed, the ileum was transected just proximal to the ileocecal valve using a GIA stapler. The ileocolic vessels were isolated and transected using the Ligasure device just distal to their takeoff and the mesentery transected distally using the Ligasure. There was a palpable pulse in the marginal artery  proximal and distal to the point of transection of the transverse colon. The mesentery was transected proximally using the Ligasure and what appeared to be an accessory right colic vein was identified. Prior to transection with the Ligasure, it was the vessel was clamped with a Kelly and after transection, was suture ligated. The specimen was then removed and sent to pathology. The abdomen and right retroperitoneum were copiously irrigated until fluid returned clear. The small bowel was run from ligament of Treitz to terminal ileum and appeared healthy. The omentum was placed in the right retroperitoneum to minimize small bowel herniating into this region. A ostomy site was created in the right abdomen and the terminal ileum brought through this site. The midline incision was closed with #1 looped PDS suture and the skin closed with staples. The ostomy was was then matured and an ostomy appliance placed.   Sterile dressings were applied. All sponge and instrument counts were correct at the conclusion of the procedure. The patient was awakened from anesthesia, extubated uneventfully, and transported to the PACU in good condition with a plan for transfusion of 1u pRBC in PACU. There were no complications.    Jesusita Oka, MD General and [facility]

## 2020-08-30 NOTE — Plan of Care (Signed)
°  Problem: Coping: °Goal: Level of anxiety will decrease °Outcome: Progressing °  °

## 2020-08-30 NOTE — Transfer of Care (Signed)
Immediate Anesthesia Transfer of Care Note  Patient: [name]  Procedure(s) Performed: HEMI COLECTOMY (N/A Abdomen) ILEOSTOMY (Right Abdomen)  Patient Location: PACU  Anesthesia Type:General  Level of Consciousness: drowsy and patient cooperative  Airway & Oxygen Therapy: Patient Spontanous Breathing and Patient connected to face mask oxygen  Post-op Assessment: Report given to RN and Post -op Vital signs reviewed and stable  Post vital signs: Reviewed and stable  Last Vitals:  Vitals Value Taken Time  BP 109/49 08/30/20 1610  Temp 36.4 C 08/30/20 1540  Pulse 83 08/30/20 1610  Resp 15 08/30/20 1610  SpO2 100 % 08/30/20 1610  Vitals shown include unvalidated device data.  Last Pain:  Vitals:   08/30/20 1605  TempSrc:   PainSc: Asleep     MDA foster at bedside in PACU, CRNA obtained 18g PIV and labs drawn for istat. 1 unit PRBC started per MDA request.    Complications: No complications documented.

## 2020-08-31 ENCOUNTER — Encounter (HOSPITAL_COMMUNITY): Payer: Self-pay | Admitting: Surgery

## 2020-08-31 DIAGNOSIS — K56600 Partial intestinal obstruction, unspecified as to cause: Secondary | ICD-10-CM | POA: Diagnosis not present

## 2020-08-31 DIAGNOSIS — N186 End stage renal disease: Secondary | ICD-10-CM | POA: Diagnosis not present

## 2020-08-31 DIAGNOSIS — C182 Malignant neoplasm of ascending colon: Secondary | ICD-10-CM

## 2020-08-31 DIAGNOSIS — K6389 Other specified diseases of intestine: Secondary | ICD-10-CM | POA: Diagnosis not present

## 2020-08-31 LAB — TYPE AND SCREEN
ABO/RH(D): O POS
Antibody Screen: NEGATIVE
Unit division: 0

## 2020-08-31 LAB — CBC WITH DIFFERENTIAL/PLATELET
Abs Immature Granulocytes: 0.07 10*3/uL (ref 0.00–0.07)
Basophils Absolute: 0 10*3/uL (ref 0.0–0.1)
Basophils Relative: 0 %
Eosinophils Absolute: 0 10*3/uL (ref 0.0–0.5)
Eosinophils Relative: 0 %
HCT: 31.3 % — ABNORMAL LOW (ref 36.0–46.0)
Hemoglobin: 10 g/dL — ABNORMAL LOW (ref 12.0–15.0)
Immature Granulocytes: 1 %
Lymphocytes Relative: 5 %
Lymphs Abs: 0.5 10*3/uL — ABNORMAL LOW (ref 0.7–4.0)
MCH: 30.8 pg (ref 26.0–34.0)
MCHC: 31.9 g/dL (ref 30.0–36.0)
MCV: 96.3 fL (ref 80.0–100.0)
Monocytes Absolute: 0.5 10*3/uL (ref 0.1–1.0)
Monocytes Relative: 4 %
Neutro Abs: 10.4 10*3/uL — ABNORMAL HIGH (ref 1.7–7.7)
Neutrophils Relative %: 90 %
Platelets: 139 10*3/uL — ABNORMAL LOW (ref 150–400)
RBC: 3.25 MIL/uL — ABNORMAL LOW (ref 3.87–5.11)
RDW: 15.7 % — ABNORMAL HIGH (ref 11.5–15.5)
WBC: 11.5 10*3/uL — ABNORMAL HIGH (ref 4.0–10.5)
nRBC: 0 % (ref 0.0–0.2)

## 2020-08-31 LAB — GLUCOSE, CAPILLARY
Glucose-Capillary: 129 mg/dL — ABNORMAL HIGH (ref 70–99)
Glucose-Capillary: 141 mg/dL — ABNORMAL HIGH (ref 70–99)
Glucose-Capillary: 165 mg/dL — ABNORMAL HIGH (ref 70–99)
Glucose-Capillary: 175 mg/dL — ABNORMAL HIGH (ref 70–99)
Glucose-Capillary: 211 mg/dL — ABNORMAL HIGH (ref 70–99)
Glucose-Capillary: 215 mg/dL — ABNORMAL HIGH (ref 70–99)

## 2020-08-31 LAB — BASIC METABOLIC PANEL
Anion gap: 22 — ABNORMAL HIGH (ref 5–15)
BUN: 56 mg/dL — ABNORMAL HIGH (ref 8–23)
CO2: 15 mmol/L — ABNORMAL LOW (ref 22–32)
Calcium: 7.5 mg/dL — ABNORMAL LOW (ref 8.9–10.3)
Chloride: 104 mmol/L (ref 98–111)
Creatinine, Ser: 8.03 mg/dL — ABNORMAL HIGH (ref 0.44–1.00)
GFR, Estimated: 5 mL/min — ABNORMAL LOW (ref >60)
Glucose, Bld: 224 mg/dL — ABNORMAL HIGH (ref 70–99)
Potassium: 5.2 mmol/L — ABNORMAL HIGH (ref 3.5–5.1)
Sodium: 141 mmol/L (ref 135–145)

## 2020-08-31 LAB — BPAM RBC
Blood Product Expiration Date: 202203112359
ISSUE DATE / TIME: 202202091523
Unit Type and Rh: 5100

## 2020-08-31 LAB — SURGICAL PATHOLOGY

## 2020-08-31 MED ORDER — MIDODRINE HCL 5 MG PO TABS: 5.0000 mg | ORAL_TABLET | ORAL | Status: DC

## 2020-08-31 MED ORDER — SODIUM CHLORIDE 0.9 % IV SOLN
2.0000 g | INTRAVENOUS | Status: DC
  Administered 2020-08-31 – 2020-09-05 (×3): 2 g via INTRAVENOUS
  Filled 2020-08-31 (×5): qty 2

## 2020-08-31 MED ORDER — VANCOMYCIN HCL IN DEXTROSE 1-5 GM/200ML-% IV SOLN
1000.0000 mg | INTRAVENOUS | Status: DC
  Filled 2020-08-31 (×3): qty 200

## 2020-08-31 MED ORDER — VANCOMYCIN HCL IN DEXTROSE 1-5 GM/200ML-% IV SOLN
INTRAVENOUS | Status: AC
  Administered 2020-08-31: 1000 mg via INTRAVENOUS
  Filled 2020-08-31: qty 200

## 2020-08-31 MED ORDER — DOXERCALCIFEROL 4 MCG/2ML IV SOLN
INTRAVENOUS | Status: AC
  Administered 2020-08-31: 3 ug via INTRAVENOUS
  Filled 2020-08-31: qty 2

## 2020-08-31 NOTE — Progress Notes (Signed)
Patient ID: [name], female   DOB: Mar 25, 1951, 70 y.o.   MRN: [id number]  PROGRESS NOTE    [name]  ACZ:[id number] DOB: 10-14-1950 DOA: 08/27/2020 PCP: [name], MD   Brief Narrative:  70 year old female history of asthma, anemia, renal cell carcinoma status post nephrectomy, type 2 diabetes mellitus, end-stage renal disease on hemodialysis Tuesday Thursday Saturday, peripheral vascular disease, hypertension, hyperlipidemia, GERD, diabetic retinopathy as well as neuropathy presented to the ED with acute abdominal pain, intractable nausea and vomiting to liquids and solids. Patient noted to have missed hemodialysis on Tuesday and Saturday prior to admission.  Patient also noted to have been scheduled for amputation of the right third toe per podiatry.  In the ED: work-up with CT abdomen and pelvis revealed small and large bowel obstruction secondary to previously demonstrated colonic mass in the ascending colon. NG tube placed.  Nephrology consulted.  GI consulted as well as general surgery.  She underwent colonoscopy on 08/29/2020 which showed likely malignant tumor which was biopsied.  She underwent surgical intervention on 08/30/2020  Assessment & Plan:   Small and large bowel obstruction secondary right ascending colon mass New diagnosis of colonic adenocarcinoma -Status post colonoscopy on 08/29/2020 by GI which showed likely malignant tumor, biopsied, removed for 3 to 8 mm polyps, pathology shows colonic adenocarcinoma. -Status post surgical intervention on 08/30/2020.  Wound care/diet as per general surgery recommendations.  Continue NG tube. -CT of the chest without contrast showed lingular nodule, slowly enlarging and recommended surveillance -Currently on gentle hydration.   -Consult oncology and palliative care for new diagnosis of colonic adenocarcinoma  End-stage renal disease on hemodialysis -Dialysis as per nephrology schedule.  Nephrology following  Diabetes mellitus  type 2 with hyperglycemia and diabetic neuropathy and retinopathy -A1c 7.8.  Continue CBGs with SSI  Right third toe nonpurulent severe cellulitis and ulcer -Followed by podiatry Dr Amalia Hailey with plans for possible amputation during this admission Continue empiric IV vancomycin and cefepime  Anemia of chronic disease -Hemoglobin stable  Thrombocytopenia -Questionable cause.  No signs of bleeding  Hyperlipidemia -Resume statin when no longer n.p.o  Asthma without exacerbation -Continue home regimen Breo Ellipta and albuterol inhaler.   DVT prophylaxis: Heparin Code Status: Full Family Communication: None at bedside Disposition Plan: Status is: Inpatient  Remains inpatient appropriate because:Hemodynamically unstable and Inpatient level of care appropriate due to severity of illness   Dispo: The patient is from: Home              Anticipated d/c is to: Home              Anticipated d/c date is: > 3 days              Patient currently is not medically stable to d/c.   Difficult to place patient No   Consultants: GI/general surgery/nephrology  Procedures: Colonoscopy on 08/29/2020 Right hemicolectomy on 06/21/2021  Antimicrobials: Vancomycin and cefepime from 08/27/2020 onwards   Subjective: Patient seen and examined at bedside.  Complains of intermittent abdominal pain.  No overnight fever, worsening shortness of breath or vomiting reported. Objective: Vitals:   08/30/20 1728 08/30/20 2048 08/31/20 0403 08/31/20 0415  BP: (!) 105/36 (!) 91/52 (!) 93/46 (!) 94/50  Pulse: 86 100 (!) 103 100  Resp: 18 18 16 18   Temp: (!) 97.4 F (36.3 C) 97.8 F (36.6 C) 98 F (36.7 C) 98 F (36.7 C)  TempSrc: Oral Oral Oral   SpO2: 90% 100% 96% 97%  Weight:      Height:        Intake/Output Summary (Last 24 hours) at 08/31/2020 0740 Last data filed at 08/31/2020 0600 Gross per 24 hour  Intake 1767.83 ml  Output 2275 ml  Net -507.17 ml   Filed Weights   08/28/20 1220 08/28/20  1600 08/30/20 1211  Weight: 89.1 kg 88.7 kg 88.7 kg    Examination:  General exam: No acute distress.  Chronically ill looking.  Currently on 2 L oxygen via nasal cannula  ENT: NG tube is still in place Respiratory system: Decreased breath sounds at bases bilaterally with some crackles, no wheezing  cardiovascular system: Mild intermittent tachycardia present, S1-S2 heard Gastrointestinal system: Abdomen is distended, soft and dressing present with surrounding tenderness.  Sluggish bowel sounds extremities: Mild bilateral lower extremity edema present; no cyanosis  Central nervous system: Awake and alert.  No focal neurological deficits.  Moves extremities Skin: No obvious ecchymosis/lesions  psychiatry: Affect is flat    Data Reviewed: I have personally reviewed following labs and imaging studies  CBC: Recent Labs  Lab 08/27/20 1350 08/28/20 0424 08/29/20 0302 08/30/20 0252 08/30/20 1601 08/31/20 0159  WBC 10.7* 9.4 7.4 6.4  --  11.5*  NEUTROABS  --   --   --  4.8  --  10.4*  HGB 12.8 11.3* 10.6* 9.4* 8.2* 10.0*  HCT 38.4 34.5* 33.0* 29.6* 24.0* 31.3*  MCV 94.1 95.6 96.2 97.7  --  96.3  PLT 204 160 146* 128*  --  631*   Basic Metabolic Panel: Recent Labs  Lab 08/27/20 1350 08/28/20 0424 08/29/20 0302 08/29/20 0303 08/30/20 0252 08/30/20 1601 08/31/20 0159  NA 137 137  --  139 142 139 141  K 4.9 5.9*  --  4.3 4.0 4.1 5.2*  CL 93* 93*  --  99 103 103 104  CO2 22 24  --  22 21*  --  15*  GLUCOSE 260* 143*  --  72 84 114* 224*  BUN 71* 76*  --  41* 47* 50* 56*  CREATININE 8.79* 8.71*  --  5.74* 7.14* 7.00* 8.03*  CALCIUM 8.2* 7.7*  --  7.4* 7.7*  --  7.5*  MG  --   --  1.8  --   --   --   --   PHOS  --   --   --  6.3* 8.0*  --   --    GFR: Estimated Creatinine Clearance: 7.6 mL/min (A) (by C-G formula based on SCr of 8.03 mg/dL (H)). Liver Function Tests: Recent Labs  Lab 08/27/20 1350 08/28/20 0424 08/29/20 0303 08/30/20 0252  AST 20 17  --   --   ALT  12 11  --   --   ALKPHOS 72 59  --   --   BILITOT 1.1 0.9  --   --   PROT 7.4 6.3*  --   --   ALBUMIN 3.5 2.9* 2.5* 2.3*   Recent Labs  Lab 08/27/20 1350  LIPASE 33   No results for input(s): AMMONIA in the last 168 hours. Coagulation Profile: No results for input(s): INR, PROTIME in the last 168 hours. Cardiac Enzymes: No results for input(s): CKTOTAL, CKMB, CKMBINDEX, TROPONINI in the last 168 hours. BNP (last 3 results) No results for input(s): PROBNP in the last 8760 hours. HbA1C: No results for input(s): HGBA1C in the last 72 hours. CBG: Recent Labs  Lab 08/30/20 1725 08/30/20 2046 08/31/20 0029 08/31/20 0409 08/31/20 0717  GLUCAP  135* 168* 215* 211* 175*   Lipid Profile: No results for input(s): CHOL, HDL, LDLCALC, TRIG, CHOLHDL, LDLDIRECT in the last 72 hours. Thyroid Function Tests: No results for input(s): TSH, T4TOTAL, FREET4, T3FREE, THYROIDAB in the last 72 hours. Anemia Panel: No results for input(s): VITAMINB12, FOLATE, FERRITIN, TIBC, IRON, RETICCTPCT in the last 72 hours. Sepsis Labs: No results for input(s): PROCALCITON, LATICACIDVEN in the last 168 hours.  Recent Results (from the past 240 hour(s))  SARS Coronavirus 2 by RT PCR (hospital order, performed in [facility] hospital lab) Nasopharyngeal Nasopharyngeal Swab     Status: None   Collection Time: 08/27/20  9:46 PM   Specimen: Nasopharyngeal Swab  Result Value Ref Range Status   SARS Coronavirus 2 NEGATIVE NEGATIVE Final    Comment: (NOTE) SARS-CoV-2 target nucleic acids are NOT DETECTED.  The SARS-CoV-2 RNA is generally detectable in upper and lower respiratory specimens during the acute phase of infection. The lowest concentration of SARS-CoV-2 viral copies this assay can detect is 250 copies / mL. A negative result does not preclude SARS-CoV-2 infection and should not be used as the sole basis for treatment or other patient management decisions.  A negative result may occur with improper  specimen collection / handling, submission of specimen other than nasopharyngeal swab, presence of viral mutation(s) within the areas targeted by this assay, and inadequate number of viral copies (<250 copies / mL). A negative result must be combined with clinical observations, patient history, and epidemiological information.  Fact Sheet for Patients:   StrictlyIdeas.no  Fact Sheet for Healthcare Providers: BankingDealers.co.za  This test is not yet approved or  cleared by the [place] FDA and has been authorized for detection and/or diagnosis of SARS-CoV-2 by FDA under an Emergency Use Authorization (EUA).  This EUA will remain in effect (meaning this test can be used) for the duration of the COVID-19 declaration under Section 564(b)(1) of the Act, 21 U.S.C. section 360bbb-3(b)(1), unless the authorization is terminated or revoked sooner.  Performed at [facility], [place] [address], [place], [place] [address]   Surgical PCR screen     Status: None   Collection Time: 08/29/20 10:13 PM   Specimen: Nasal Mucosa; Nasal Swab  Result Value Ref Range Status   MRSA, PCR NEGATIVE NEGATIVE Final   Staphylococcus aureus NEGATIVE NEGATIVE Final    Comment: (NOTE) The Xpert SA Assay (FDA approved for NASAL specimens in patients 78 years of age and older), is one component of a comprehensive surveillance program. It is not intended to diagnose infection nor to guide or monitor treatment. Performed at [facility], [place] [address], [place],  [address]          Radiology Studies: CT CHEST W CONTRAST  Result Date: 08/29/2020 CLINICAL DATA:  Staging workup of ascending colon cancer. Prior right renal cell carcinoma. EXAM: CT CHEST WITH CONTRAST TECHNIQUE: Multidetector CT imaging of the chest was performed during intravenous contrast administration. CONTRAST:  52mL OMNIPAQUE IOHEXOL 300 MG/ML  SOLN  COMPARISON:  CT abdomen 08/27/2020 and CT chest 06/28/2020 FINDINGS: Cardiovascular: Coronary, aortic arch, and branch vessel atherosclerotic vascular disease. Mildly prominent right ventricle and overall mild cardiomegaly. Mitral and aortic valve calcifications. Mediastinum/Nodes: Lower paratracheal node anterior to the carina measures 2.1 cm in short axis on image 57 series 3, previously the same by my measurements. Right hilar lymph node measures 1.2 cm in short axis on image 62 of series 3, previously likely similar on 06/28/2020 although difficult to  measure on that exam due to the lack of IV contrast. A nasogastric tube is present and terminates within a type 1 hiatal hernia. Lungs/Pleura: Small right and trace left pleural effusions. Mild scarring or atelectasis in the lingula and lower lobes. 4 by 5 mm lingular nodule on image 82 series 4, measuring 0.3 cm in diameter on 11/04/2018 and 05/13/2019 and 0.4 by 0.3 cm on 12/01/2019. Accordingly this nodule is slowly enlarging and merits surveillance, but is still too small for PET CT characterization. Upper Abdomen: Perihepatic ascites. Left adrenalectomy. Atherosclerosis. Musculoskeletal: Degenerative glenohumeral arthropathy bilaterally. Thoracic spondylosis. IMPRESSION: 1. 4 by 5 mm lingular nodule, slowly enlarging over the last 2 years, but still too small for PET CT characterization. This could be inflammatory or neoplastic. Surveillance is likely warranted. 2. Stable paratracheal and right hilar adenopathy. 3. Small right and trace left pleural effusions. 4. Other imaging findings of potential clinical significance: Coronary, aortic arch, and branch vessel atherosclerotic vascular disease. Mildly prominent right ventricle and overall mild cardiomegaly. Mitral and aortic valve calcifications. Perihepatic ascites. Left adrenalectomy. Degenerative glenohumeral arthropathy bilaterally. 5. Aortic atherosclerosis. Aortic Atherosclerosis (ICD10-I70.0).  Electronically Signed   By: Van Clines M.D.   On: 08/29/2020 19:22        Scheduled Meds: . acetaminophen  1,000 mg Per Tube Q6H  . chlorhexidine  15 mL Mouth/Throat NOW  . Chlorhexidine Gluconate Cloth  6 each Topical Q0600  . doxercalciferol  3 mcg Intravenous Q T,Th,Sa-HD  . fluticasone furoate-vilanterol  1 puff Inhalation Daily  . gabapentin  300 mg Oral TID  . heparin injection (subcutaneous)  5,000 Units Subcutaneous Q8H  . insulin aspart  0-15 Units Subcutaneous Q4H  . mouth rinse  15 mL Mouth Rinse BID  . methocarbamol  1,000 mg Per Tube Q8H  . multivitamin  1 tablet Oral QHS  . mupirocin ointment  1 application Nasal BID  . pantoprazole (PROTONIX) IV  40 mg Intravenous Q24H  . vancomycin variable dose per unstable renal function (pharmacist dosing)   Does not apply See admin instructions   Continuous Infusions: . sodium chloride 10 mL/hr (08/30/20 1800)  . sodium chloride    . ceFEPime (MAXIPIME) IV 1 g (08/30/20 2215)  . dextrose 50 mL/hr at 08/30/20 [place], MD [facility] 08/31/2020, 7:40 AM

## 2020-08-31 NOTE — Progress Notes (Addendum)
Progress Note   Subjective  Patient is s/p R hemocolectomy with ileostomy for obstructing colon mass. Recovering on ward. Has some post surgical pain, otherwise resting.   Objective   Vital signs in last 24 hours: Temp:  [97.4 F (36.3 C)-98.6 F (37 C)] 98 F (36.7 C) (02/10 0415) Pulse Rate:  [81-103] 100 (02/10 0415) Resp:  [15-22] 18 (02/10 0415) BP: (69-130)/(30-61) 94/50 (02/10 0415) SpO2:  [90 %-100 %] 97 % (02/10 0828) FiO2 (%):  [28 %] 28 % (02/10 0828) Weight:  [88.7 kg] 88.7 kg (02/09 1211) Last BM Date: 08/29/20 General:    white female in NAD, NG in place Abdomen:  Soft, appropriate tenderness, soft  Intake/Output from previous day: 02/09 0701 - 02/10 0700 In: 1767.8 [I.V.:1167.8; IV Piggyback:600] Out: 2275 [Urine:425; Emesis/NG output:1050; Blood:800] Intake/Output this shift: No intake/output data recorded.  Lab Results: Recent Labs    08/29/20 0302 08/30/20 0252 08/30/20 1601 08/31/20 0159  WBC 7.4 6.4  --  11.5*  HGB 10.6* 9.4* 8.2* 10.0*  HCT 33.0* 29.6* 24.0* 31.3*  PLT 146* 128*  --  139*   BMET Recent Labs    08/29/20 0303 08/30/20 0252 08/30/20 1601 08/31/20 0159  NA 139 142 139 141  K 4.3 4.0 4.1 5.2*  CL 99 103 103 104  CO2 22 21*  --  15*  GLUCOSE 72 84 114* 224*  BUN 41* 47* 50* 56*  CREATININE 5.74* 7.14* 7.00* 8.03*  CALCIUM 7.4* 7.7*  --  7.5*   LFT Recent Labs    08/30/20 0252  ALBUMIN 2.3*   PT/INR No results for input(s): LABPROT, INR in the last 72 hours.  Studies/Results: CT CHEST W CONTRAST  Result Date: 08/29/2020 CLINICAL DATA:  Staging workup of ascending colon cancer. Prior right renal cell carcinoma. EXAM: CT CHEST WITH CONTRAST TECHNIQUE: Multidetector CT imaging of the chest was performed during intravenous contrast administration. CONTRAST:  56mL OMNIPAQUE IOHEXOL 300 MG/ML  SOLN COMPARISON:  CT abdomen 08/27/2020 and CT chest 06/28/2020 FINDINGS: Cardiovascular: Coronary, aortic arch, and branch  vessel atherosclerotic vascular disease. Mildly prominent right ventricle and overall mild cardiomegaly. Mitral and aortic valve calcifications. Mediastinum/Nodes: Lower paratracheal node anterior to the carina measures 2.1 cm in short axis on image 57 series 3, previously the same by my measurements. Right hilar lymph node measures 1.2 cm in short axis on image 62 of series 3, previously likely similar on 06/28/2020 although difficult to measure on that exam due to the lack of IV contrast. A nasogastric tube is present and terminates within a type 1 hiatal hernia. Lungs/Pleura: Small right and trace left pleural effusions. Mild scarring or atelectasis in the lingula and lower lobes. 4 by 5 mm lingular nodule on image 82 series 4, measuring 0.3 cm in diameter on 11/04/2018 and 05/13/2019 and 0.4 by 0.3 cm on 12/01/2019. Accordingly this nodule is slowly enlarging and merits surveillance, but is still too small for PET CT characterization. Upper Abdomen: Perihepatic ascites. Left adrenalectomy. Atherosclerosis. Musculoskeletal: Degenerative glenohumeral arthropathy bilaterally. Thoracic spondylosis. IMPRESSION: 1. 4 by 5 mm lingular nodule, slowly enlarging over the last 2 years, but still too small for PET CT characterization. This could be inflammatory or neoplastic. Surveillance is likely warranted. 2. Stable paratracheal and right hilar adenopathy. 3. Small right and trace left pleural effusions. 4. Other imaging findings of potential clinical significance: Coronary, aortic arch, and branch vessel atherosclerotic vascular disease. Mildly prominent right ventricle and overall mild cardiomegaly. Mitral and aortic  valve calcifications. Perihepatic ascites. Left adrenalectomy. Degenerative glenohumeral arthropathy bilaterally. 5. Aortic atherosclerosis. Aortic Atherosclerosis (ICD10-I70.0). Electronically Signed   By: Van Clines M.D.   On: 08/29/2020 19:22       Assessment / Plan:    70 y/o female with  multiple medical problems who presented with colonic obstruction. Colonoscopy done 2 days ago confirmed adenocarcinoma of the colon, polyps were adenomas. Path came back from colonoscopy, and I reviewed this with her. She understands. She had surgery yesterday, path from the specimen and lymph nodes pending. She has had oncology consultation which is pending. Hopefully she heals well from her surgery. Would repeat colonoscopy in one year pending her course if she is a candidate for surveillance at that time. We are signing off for now, please call with questions.  Homer Cellar, MD [facility]

## 2020-08-31 NOTE — Progress Notes (Signed)
Subjective: Seen in room, for dialysis today, minimal abdominal discomfort post yesterday abdominal surgery, NG tube in place PT in room to see her  Objective Vital signs in last 24 hours: Vitals:   08/31/20 0900 08/31/20 1140 08/31/20 1150 08/31/20 1200  BP: (!) 107/52 (!) 88/56 (!) 88/56 (!) 91/29  Pulse: (!) 101 92    Resp: 18 16 16    Temp: 99 F (37.2 C) 97.6 F (36.4 C)    TempSrc: Oral Oral    SpO2: 98%     Weight:      Height:       Weight change:   Physical Exam: General:Alert ,chronic ill female, NGT NAD Heart:RRR, no MRG Lungs:Decreased breath sounds at bases otherwise CTA nonlabored breathing Abdomen:Hypoactive BS, NGT, soft, generalized mild tenderness, midline incision honeycomb dressing clean dry, ileostomy bag in place  Extremities:No pedal edema, right foot dry ulcer Dialysis Access:LUA AVFpositive bruit    OP HD:TTS [place] 3h 63min 400/500 87kg 2/2.25 bath P2 LUA AVF Hep 5000+ 1000 midrun - mircera 100ug last 1/11 - hect 3 ug  Problem/Plan: 1. ESRD -(normalTTS ) HD today back on schedule, was off schedule.secondary to procedures and numerous emergent patients a.m. labs = K  5.2    2. Adenocarcinoma of colon with nearly obstructing mass status post Exploratory lap /right hemicolectomy, ileostomy creation 08/30/20 -CCS, GI seeing and oncology and palliative care been consulted for new diagnosis of colonic adenocarcinoma 3. Hypertension/volume=uses midodrine for BP control dialysis, appears euvolemic/has been n.p.o. with IV  fluids  50 mL/h 4. Renal osteodystrophy -phosphorus 6.3,>8.0  corrected calcium 9.1usually she's on TUMS as a binder/npo for now /she has refused other binders and only willing to take TUMS. Last op Phos on 08/24/20 was 9.3.  Follow-up trend.  Hectorol on dialysis 3 mics 5. Anemia -Hgb 10.0 this a.m.@ goal. Will hold ESA now with probable malignancy, usually on Mircera 100 (last given 08/01/2020) 6. DM type 2- per   admit 7. PAD -was scheduled for possible amputation of the right 3rd toelater , on vancomycin and cefepime per admit 8. Asthma without exasperation on meds per admit  Ernest Haber, PA-C [facility] [phone] 08/31/2020,12:57 PM  LOS: 4 days   Labs: Basic Metabolic Panel: Recent Labs  Lab 08/29/20 0303 08/30/20 0252 08/30/20 1601 08/31/20 0159  NA 139 142 139 141  K 4.3 4.0 4.1 5.2*  CL 99 103 103 104  CO2 22 21*  --  15*  GLUCOSE 72 84 114* 224*  BUN 41* 47* 50* 56*  CREATININE 5.74* 7.14* 7.00* 8.03*  CALCIUM 7.4* 7.7*  --  7.5*  PHOS 6.3* 8.0*  --   --    Liver Function Tests: Recent Labs  Lab 08/27/20 1350 08/28/20 0424 08/29/20 0303 08/30/20 0252  AST 20 17  --   --   ALT 12 11  --   --   ALKPHOS 72 59  --   --   BILITOT 1.1 0.9  --   --   PROT 7.4 6.3*  --   --   ALBUMIN 3.5 2.9* 2.5* 2.3*   Recent Labs  Lab 08/27/20 1350  LIPASE 33   No results for input(s): AMMONIA in the last 168 hours. CBC: Recent Labs  Lab 08/27/20 1350 08/28/20 0424 08/29/20 0302 08/30/20 0252 08/30/20 1601 08/31/20 0159  WBC 10.7* 9.4 7.4 6.4  --  11.5*  NEUTROABS  --   --   --  4.8  --  10.4*  HGB 12.8 11.3* 10.6* 9.4* 8.2* 10.0*  HCT 38.4 34.5* 33.0* 29.6* 24.0* 31.3*  MCV 94.1 95.6 96.2 97.7  --  96.3  PLT 204 160 146* 128*  --  139*   Cardiac Enzymes: No results for input(s): CKTOTAL, CKMB, CKMBINDEX, TROPONINI in the last 168 hours. CBG: Recent Labs  Lab 08/30/20 1725 08/30/20 2046 08/31/20 0029 08/31/20 0409 08/31/20 0717  GLUCAP 135* 168* 215* 211* 175*    Studies/Results: CT CHEST W CONTRAST  Result Date: 08/29/2020 CLINICAL DATA:  Staging workup of ascending colon cancer. Prior right renal cell carcinoma. EXAM: CT CHEST WITH CONTRAST TECHNIQUE: Multidetector CT imaging of the chest was performed during intravenous contrast administration. CONTRAST:  96mL OMNIPAQUE IOHEXOL 300 MG/ML  SOLN COMPARISON:  CT abdomen 08/27/2020 and CT  chest 06/28/2020 FINDINGS: Cardiovascular: Coronary, aortic arch, and branch vessel atherosclerotic vascular disease. Mildly prominent right ventricle and overall mild cardiomegaly. Mitral and aortic valve calcifications. Mediastinum/Nodes: Lower paratracheal node anterior to the carina measures 2.1 cm in short axis on image 57 series 3, previously the same by my measurements. Right hilar lymph node measures 1.2 cm in short axis on image 62 of series 3, previously likely similar on 06/28/2020 although difficult to measure on that exam due to the lack of IV contrast. A nasogastric tube is present and terminates within a type 1 hiatal hernia. Lungs/Pleura: Small right and trace left pleural effusions. Mild scarring or atelectasis in the lingula and lower lobes. 4 by 5 mm lingular nodule on image 82 series 4, measuring 0.3 cm in diameter on 11/04/2018 and 05/13/2019 and 0.4 by 0.3 cm on 12/01/2019. Accordingly this nodule is slowly enlarging and merits surveillance, but is still too small for PET CT characterization. Upper Abdomen: Perihepatic ascites. Left adrenalectomy. Atherosclerosis. Musculoskeletal: Degenerative glenohumeral arthropathy bilaterally. Thoracic spondylosis. IMPRESSION: 1. 4 by 5 mm lingular nodule, slowly enlarging over the last 2 years, but still too small for PET CT characterization. This could be inflammatory or neoplastic. Surveillance is likely warranted. 2. Stable paratracheal and right hilar adenopathy. 3. Small right and trace left pleural effusions. 4. Other imaging findings of potential clinical significance: Coronary, aortic arch, and branch vessel atherosclerotic vascular disease. Mildly prominent right ventricle and overall mild cardiomegaly. Mitral and aortic valve calcifications. Perihepatic ascites. Left adrenalectomy. Degenerative glenohumeral arthropathy bilaterally. 5. Aortic atherosclerosis. Aortic Atherosclerosis (ICD10-I70.0). Electronically Signed   By: Van Clines M.D.    On: 08/29/2020 19:22   Medications: . sodium chloride 10 mL/hr (08/30/20 1800)  . sodium chloride    . ceFEPime (MAXIPIME) IV    . dextrose 50 mL/hr at 08/30/20 1130  . vancomycin     . acetaminophen  1,000 mg Per Tube Q6H  . Chlorhexidine Gluconate Cloth  6 each Topical Q0600  . doxercalciferol  3 mcg Intravenous Q T,Th,Sa-HD  . fluticasone furoate-vilanterol  1 puff Inhalation Daily  . gabapentin  300 mg Oral TID  . heparin injection (subcutaneous)  5,000 Units Subcutaneous Q8H  . insulin aspart  0-15 Units Subcutaneous Q4H  . mouth rinse  15 mL Mouth Rinse BID  . methocarbamol  1,000 mg Per Tube Q8H  . multivitamin  1 tablet Oral QHS  . mupirocin ointment  1 application Nasal BID  . pantoprazole (PROTONIX) IV  40 mg Intravenous Q24H

## 2020-08-31 NOTE — Progress Notes (Signed)
1 Day Post-Op  Subjective: CC: Patient reports that she has some abdominal soreness that is generalized but most around her midline incision. She has not gotten oob. She denies urination (reports she urinates at baseline 1-2x day). No nausea. NGT in place. Denies flatus or BM since surgery.   Objective: Vital signs in last 24 hours: Temp:  [97.4 F (36.3 C)-98.6 F (37 C)] 98 F (36.7 C) (02/10 0415) Pulse Rate:  [81-103] 100 (02/10 0415) Resp:  [15-22] 18 (02/10 0415) BP: (69-130)/(30-61) 94/50 (02/10 0415) SpO2:  [90 %-100 %] 97 % (02/10 0828) FiO2 (%):  [28 %] 28 % (02/10 0828) Weight:  [88.7 kg] 88.7 kg (02/09 1211) Last BM Date: 08/29/20  Intake/Output from previous day: 02/09 0701 - 02/10 0700 In: 1767.8 [I.V.:1167.8; IV Piggyback:600] Out: 2275 [Urine:425; Emesis/NG output:1050; Blood:800] Intake/Output this shift: Total I/O In: -  Out: 58 [Stool:50]  PE: Gen:  Sleepy but awakens to verbal commands and responds appropriately  Card:  Reg Pulm:  Normal rate and effort  Abd: Soft, ND, generalized tenderness that is greatest around midline incision and without rigidity or guarding. Ileostomy bag in place with some sweat - no air or stool. Stoma is red and viable, above skin level. Midline wound with honeycomb dressing in place, c/d/i.  Skin: no rashes noted, warm and dry   Lab Results:  Recent Labs    08/30/20 0252 08/30/20 1601 08/31/20 0159  WBC 6.4  --  11.5*  HGB 9.4* 8.2* 10.0*  HCT 29.6* 24.0* 31.3*  PLT 128*  --  139*   BMET Recent Labs    08/30/20 0252 08/30/20 1601 08/31/20 0159  NA 142 139 141  K 4.0 4.1 5.2*  CL 103 103 104  CO2 21*  --  15*  GLUCOSE 84 114* 224*  BUN 47* 50* 56*  CREATININE 7.14* 7.00* 8.03*  CALCIUM 7.7*  --  7.5*   PT/INR No results for input(s): LABPROT, INR in the last 72 hours. CMP     Component Value Date/Time   NA 141 08/31/2020 0159   K 5.2 (H) 08/31/2020 0159   CL 104 08/31/2020 0159   CO2 15 (L)  08/31/2020 0159   GLUCOSE 224 (H) 08/31/2020 0159   BUN 56 (H) 08/31/2020 0159   CREATININE 8.03 (H) 08/31/2020 0159   CREATININE 6.19 (H) 02/07/2020 1159   CALCIUM 7.5 (L) 08/31/2020 0159   PROT 6.3 (L) 08/28/2020 0424   ALBUMIN 2.3 (L) 08/30/2020 0252   AST 17 08/28/2020 0424   ALT 11 08/28/2020 0424   ALKPHOS 59 08/28/2020 0424   BILITOT 0.9 08/28/2020 0424   GFRNONAA 5 (L) 08/31/2020 0159   GFRNONAA 6 (L) 02/07/2020 1159   GFRAA 7 (L) 02/07/2020 1159   Lipase     Component Value Date/Time   LIPASE 33 08/27/2020 1350       Studies/Results: CT CHEST W CONTRAST  Result Date: 08/29/2020 CLINICAL DATA:  Staging workup of ascending colon cancer. Prior right renal cell carcinoma. EXAM: CT CHEST WITH CONTRAST TECHNIQUE: Multidetector CT imaging of the chest was performed during intravenous contrast administration. CONTRAST:  73mL OMNIPAQUE IOHEXOL 300 MG/ML  SOLN COMPARISON:  CT abdomen 08/27/2020 and CT chest 06/28/2020 FINDINGS: Cardiovascular: Coronary, aortic arch, and branch vessel atherosclerotic vascular disease. Mildly prominent right ventricle and overall mild cardiomegaly. Mitral and aortic valve calcifications. Mediastinum/Nodes: Lower paratracheal node anterior to the carina measures 2.1 cm in short axis on image 57 series 3, previously the same by  my measurements. Right hilar lymph node measures 1.2 cm in short axis on image 62 of series 3, previously likely similar on 06/28/2020 although difficult to measure on that exam due to the lack of IV contrast. A nasogastric tube is present and terminates within a type 1 hiatal hernia. Lungs/Pleura: Small right and trace left pleural effusions. Mild scarring or atelectasis in the lingula and lower lobes. 4 by 5 mm lingular nodule on image 82 series 4, measuring 0.3 cm in diameter on 11/04/2018 and 05/13/2019 and 0.4 by 0.3 cm on 12/01/2019. Accordingly this nodule is slowly enlarging and merits surveillance, but is still too small for PET  CT characterization. Upper Abdomen: Perihepatic ascites. Left adrenalectomy. Atherosclerosis. Musculoskeletal: Degenerative glenohumeral arthropathy bilaterally. Thoracic spondylosis. IMPRESSION: 1. 4 by 5 mm lingular nodule, slowly enlarging over the last 2 years, but still too small for PET CT characterization. This could be inflammatory or neoplastic. Surveillance is likely warranted. 2. Stable paratracheal and right hilar adenopathy. 3. Small right and trace left pleural effusions. 4. Other imaging findings of potential clinical significance: Coronary, aortic arch, and branch vessel atherosclerotic vascular disease. Mildly prominent right ventricle and overall mild cardiomegaly. Mitral and aortic valve calcifications. Perihepatic ascites. Left adrenalectomy. Degenerative glenohumeral arthropathy bilaterally. 5. Aortic atherosclerosis. Aortic Atherosclerosis (ICD10-I70.0). Electronically Signed   By: Van Clines M.D.   On: 08/29/2020 19:22    Anti-infectives: Anti-infectives (From admission, onward)   Start     Dose/Rate Route Frequency Ordered Stop   08/30/20 1157  vancomycin variable dose per unstable renal function (pharmacist dosing)         Does not apply See admin instructions 08/30/20 1157     08/30/20 1015  clindamycin (CLEOCIN) IVPB 900 mg       "And" Linked Group Details   900 mg 100 mL/hr over 30 Minutes Intravenous 60 min pre-op 08/29/20 1623 08/30/20 1341   08/30/20 1015  gentamicin (GARAMYCIN) 440 mg in dextrose 5 % 100 mL IVPB       "And" Linked Group Details   5 mg/kg  88.7 kg 111 mL/hr over 60 Minutes Intravenous 60 min pre-op 08/29/20 1623 08/30/20 1138   08/29/20 1345  vancomycin (VANCOREADY) IVPB 750 mg/150 mL        750 mg 150 mL/hr over 60 Minutes Intravenous  Once 08/29/20 1257 08/29/20 1545   08/28/20 1345  vancomycin (VANCOCIN) IVPB 750 mg/150 ml premix  Status:  Discontinued        750 mg 150 mL/hr over 60 Minutes Intravenous  Once 08/28/20 1335 08/29/20 1257    08/27/20 2300  vancomycin (VANCOREADY) IVPB 2000 mg/400 mL        2,000 mg 200 mL/hr over 120 Minutes Intravenous  Once 08/27/20 2211 08/28/20 0040   08/27/20 2200  ceFEPIme (MAXIPIME) 1 g in sodium chloride 0.9 % 100 mL IVPB        1 g 200 mL/hr over 30 Minutes Intravenous Every 24 hours 08/27/20 2142     08/27/20 2130  vancomycin (VANCOCIN) IVPB 1000 mg/200 mL premix  Status:  Discontinued        1,000 mg 200 mL/hr over 60 Minutes Intravenous  Once 08/27/20 2118 08/27/20 2209   08/27/20 2130  ceFEPIme (MAXIPIME) 2 g in sodium chloride 0.9 % 100 mL IVPB  Status:  Discontinued        2 g 200 mL/hr over 30 Minutes Intravenous  Once 08/27/20 2118 08/27/20 2139       Assessment/Plan ESRD on HD TTS -  hyperkalemia on todays labs (K 5.2)  Hx renal cell carcinoma; s/p partial right nephrectomy adrenalectomy 2018 Type 2 diabetes; diabetic neuropathy, diabetic retinopathy. 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 Osteo of b/l third LE digits - podiatry following GERD Hyperlipidemia Hx asthma Protein Calorie Malnutrition - Pre-alb 11.4. Nutrition consult when tolerating diet. If develops prolonged ileus, may need TPN.  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 Bobbye Morton - 08/30/2020 POD #1 - Path from colonoscopy reveals colon mass bx w/ poorly differentiated adenocarcinoma with signet ring features. Polypectomy w/ tubular adenoma w/ no high grade dysplasia or carcinoma.  - CEA 3.7 - Surgical path pending.  - CT of the chest without contrast showed lingular nodule, slowly enlarging over the last 2 years.  - CT of the A/P w/ few mildly enlarged mesenteric lymph nodes in the right lower quadrant and right mid abdomen, concerning for nodal metastatic disease. - It appears that oncology and palliative care have already been consulted for new diagnosis of colonic adenocarcinoma  - Await return of  bowel function. Keep NPO and NGT to LIWS. If does not have ROBF in a few days, will order TPN - Mobilize - PT consult - Pulm toilet - WOCN following for new ostomy  FEN -NPO, NGT, IVF per TRH  VTE -SCDs, Heparin subq ID -Vanc and Cefepime for osteo (per podiatry). Received clinda and gent periop.  Foley - None. Bladder scan (reports she voids 1-2x/day. Has not voided since surgery) Follow-Up - Dr. Bobbye Morton    LOS: 4 days    Jillyn Ledger , [facility] 08/31/2020, 9:02 AM Please see Amion for pager number during day hours 7:00am-4:30pm

## 2020-08-31 NOTE — Progress Notes (Signed)
Pharmacy Antibiotic Note  [name] is a 70 y.o. female admitted on 08/27/2020 with abd pain and found to have bowel obstruction.  Now s/o ex-lap 2/9 with hemicolectomy and ostomy placement.  Pt was on antibiotics PTA for cellulitis / toe ulcer and dog bite to L hand.  Pharmacy has been consulted for Vancomycin and Cefepime dosing pending toe surgery plans.  Pt has ESRD with usual HD on TTS.  HD has been off schedule since admission.  Plan for HD today and return to usual TTS schedule.  Will adjust abx doses accordingly.  Plan: Change Cefepime to 2gm IV qTTS Vancomycin1000mg IV x qHD TTS  > f/u abx plans, vanc level pre-HD prn   > possible toe amputation this admit  Height: 5\' 7"  (170.2 cm) Weight: 88.7 kg (195 lb 8.8 oz) IBW/kg (Calculated) : 61.6  Temp (24hrs), Avg:97.9 F (36.6 C), Min:97.4 F (36.3 C), Max:99 F (37.2 C)  Recent Labs  Lab 08/27/20 1350 08/28/20 0424 08/29/20 0302 08/29/20 0303 08/30/20 0252 08/30/20 1601 08/31/20 0159  WBC 10.7* 9.4 7.4  --  6.4  --  11.5*  CREATININE 8.79* 8.71*  --  5.74* 7.14* 7.00* 8.03*    Estimated Creatinine Clearance: 7.6 mL/min (A) (by C-G formula based on SCr of 8.03 mg/dL (H)).    Allergies  Allergen Reactions  . Latex Itching  . Penicillins Other (See Comments)    UNSPECIFIED CHILDHOOD REACTION  Has patient had a PCN reaction causing immediate rash, facial/tongue/throat swelling, SOB or lightheadedness with hypotension: Unknown Has patient had a PCN reaction causing severe rash involving mucus membranes or skin necrosis: Unknown Has patient had a PCN reaction that required hospitalization: Unknown Has patient had a PCN reaction occurring within the last 10 years: Unknown If all of the above answers are "NO", then may proceed with Cephalosporin use.   . Adhesive [Tape] Rash    Antimicrobials this admission: Vanc 2/6 >> Cefepime 2/7 >>  Dose adjustments this admission:   Microbiology results: 2/6 covid:  neg 2/8 MRSA PCR: negative   Thank you for allowing pharmacy to be a part of this patient's care.  Elmer Ramp 08/31/2020 11:37 AM

## 2020-08-31 NOTE — Progress Notes (Signed)
Patient's son Ilissa Rosner called RN and requested to speak with MD regarding mother's previous surgery yesterday and the next steps for plan of care, MD has been notified and RN will continue to monitor this patient.

## 2020-08-31 NOTE — Consult Note (Addendum)
Zachary Nurse ostomy follow up Pt had ileostomy surgery performed yesterday.  Asked patient if there are any family members that will need to participate in teaching sessions and will be assisting her after discharge and she did not answer the question.   Stoma type/location: Stoma is red and viable, 1 inch, slightly above skin level. Peristomal assessment: intact skin surrounding Output: small amt brown unformed stool in the pouch  Ostomy pouching: 2pc.  Education provided:  Applied barrier ring and 2 piece pouch, but will order convex pouches for next pouch change. Educational materials left in the room.  Ordered 5 barrier rings, Lawson # [id number], and 5 convex pouches, Lawson # [id number]. Enrolled patient in [place] Start Discharge program: Yes [facility] team will follow for further teaching sessions on Monday. She is very drowsy and did not watch the procedure or ask questions or participate.  Pt will need total assistance after discharge with pouching activities if she does not greatly improve during the recovery phase.  Julien Girt MSN, RN, [place], [place], [place]

## 2020-08-31 NOTE — Evaluation (Signed)
Physical Therapy Evaluation Patient Details Name: [name] MRN: [id number] DOB: 1950/09/22 Today's Date: 08/31/2020   History of Present Illness  70yo female c/o constipation and malaise with nausea and vomiting/abdominal pain; missed HD due to feeling poorly and then winter weather. CT with large and small bowe obstruction secondary to ascending colon mass in RLQ concerning for metastatic process. Recieved hemicolectomy and ileostomy 08/30/20. PMH CA, CKD, DM, HTN, HLD, hx toe amputations  Clinical Impression   Patient received in bed, lethargic but willing to attempt therapy today. Needed heavy levels of physical assist for low level tasks such as rolling and getting to EOB; once at edge of bed, required MaxA to maintain upright due to posterior lean. Very fearful of falling and definitely with impaired cognition, unsure what baseline is as no family is available to confirm. Left in bed with all needs met in care of transport tech preparing to take her to HD. Unfortunately will likely require SNF moving forward.     Follow Up Recommendations SNF;Supervision/Assistance - 24 hour    Equipment Recommendations  Rolling walker with 5" wheels;3in1 (PT);Wheelchair (measurements PT);Wheelchair cushion (measurements PT);Hospital bed;Other (comment) (hoyer lift)    Recommendations for Other Services       Precautions / Restrictions Precautions Precautions: Fall;Other (comment) Precaution Comments: NGT, osteomy R LQ, toe wounds (possibly pending surgical intervention?), abdominal incision Restrictions Weight Bearing Restrictions: No      Mobility  Bed Mobility Overal bed mobility: Needs Assistance Bed Mobility: Rolling;Supine to Sit;Sit to Supine Rolling: Total assist;+2 for physical assistance (resisting)   Supine to sit: Max assist;+2 for physical assistance Sit to supine: Max assist;+2 for physical assistance   General bed mobility comments: attempted rolling first, unable to  tolerate and physically resisting due to pain; ultimately able to get to EOB with MaxAx2 and helicopter technique with HOB elevated. Tolerated sitting about 3 minutes before transport arrived to take her to HD    Transfers                 General transfer comment: deferred- pain and fatigue  Ambulation/Gait             General Gait Details: deferred- pain and fatigue  Stairs            Wheelchair Mobility    Modified Rankin (Stroke Patients Only)       Balance Overall balance assessment: Needs assistance Sitting-balance support: Feet supported;Bilateral upper extremity supported Sitting balance-Leahy Scale: Poor Sitting balance - Comments: MaxA to maitnain upright due to posterior lean Postural control: Posterior lean     Standing balance comment: unable to attempt today                             Pertinent Vitals/Pain Pain Assessment: Faces Faces Pain Scale: Hurts whole lot Pain Location: abdominal incision Pain Descriptors / Indicators: Discomfort;Sharp;Grimacing Pain Intervention(s): Limited activity within patient's tolerance;Monitored during session;Repositioned    Home Living Family/patient expects to be discharged to:: Private residence Living Arrangements: Alone Available Help at Discharge: Family;Friend(s);Available PRN/intermittently Type of Home: House Home Access: Level entry     Home Layout: One level Home Equipment: Walker - 4 wheels;Cane - single point Additional Comments: does not really use assistive devices, can walk short distances without them but does use electric scooter in the store    Prior Function Level of Independence: Independent         Comments: not really any friends  or family available to help     Hand Dominance        Extremity/Trunk Assessment   Upper Extremity Assessment Upper Extremity Assessment: Defer to OT evaluation    Lower Extremity Assessment Lower Extremity Assessment:  Generalized weakness    Cervical / Trunk Assessment Cervical / Trunk Assessment: Kyphotic  Communication   Communication: No difficulties  Cognition Arousal/Alertness: Lethargic Behavior During Therapy: Flat affect;Anxious Overall Cognitive Status: No family/caregiver present to determine baseline cognitive functioning Area of Impairment: Orientation;Attention;Memory;Following commands;Safety/judgement;Awareness;Problem solving                   Current Attention Level: Focused Memory: Decreased short-term memory;Decreased recall of precautions Following Commands: Follows one step commands inconsistently;Follows one step commands with increased time Safety/Judgement: Decreased awareness of safety;Decreased awareness of deficits Awareness: Intellectual Problem Solving: Slow processing;Decreased initiation;Difficulty sequencing;Requires verbal cues;Requires tactile cues General Comments: slow processing and very anxious about mobility- needed repeated multimodal cues and became fearful of falling when we were repositioning in bed      General Comments General comments (skin integrity, edema, etc.): VSS on 2LPM O2    Exercises     Assessment/Plan    PT Assessment Patient needs continued PT services  PT Problem List Decreased strength;Decreased cognition;Decreased knowledge of use of DME;Obesity;Decreased activity tolerance;Decreased safety awareness;Decreased balance;Decreased knowledge of precautions;Pain;Decreased mobility;Decreased coordination       PT Treatment Interventions DME instruction;Balance training;Gait training;Stair training;Cognitive remediation;Functional mobility training;Patient/family education;Therapeutic activities;Wheelchair mobility training;Therapeutic exercise;Manual techniques    PT Goals (Current goals can be found in the Care Plan section)  Acute Rehab PT Goals Patient Stated Goal: less pain PT Goal Formulation: With patient Time For Goal  Achievement: 09/14/20 Potential to Achieve Goals: Fair    Frequency Min 2X/week   Barriers to discharge        Co-evaluation               AM-PAC PT "6 Clicks" Mobility  Outcome Measure Help needed turning from your back to your side while in a flat bed without using bedrails?: Total Help needed moving from lying on your back to sitting on the side of a flat bed without using bedrails?: A Lot Help needed moving to and from a bed to a chair (including a wheelchair)?: Total Help needed standing up from a chair using your arms (e.g., wheelchair or bedside chair)?: Total Help needed to walk in hospital room?: Total Help needed climbing 3-5 steps with a railing? : Total 6 Click Score: 7    End of Session Equipment Utilized During Treatment: Oxygen Activity Tolerance: Patient limited by pain Patient left: in bed;with call bell/phone within reach;Other (comment) (transport tech present to take her to HD) Nurse Communication: Mobility status PT Visit Diagnosis: Unsteadiness on feet (R26.81);Difficulty in walking, not elsewhere classified (R26.2);Muscle weakness (generalized) (M62.81);Other abnormalities of gait and mobility (R26.89);Pain Pain - Right/Left:  (abdominal) Pain - part of body:  (abdominal)    Time: 6213-0865 PT Time Calculation (min) (ACUTE ONLY): 25 min   Charges:   PT Evaluation $PT Eval Moderate Complexity: 1 Mod PT Treatments $Therapeutic Activity: 8-22 mins        Windell Norfolk, DPT, PN1   Supplemental Physical Therapist [place]    Pager [phone] Acute Rehab Office [phone]

## 2020-08-31 NOTE — Consult Note (Signed)
[facility]  [phone] Fax:([phone]   MEDICAL ONCOLOGY - INITIAL CONSULTATION  Referral MD: Dr. 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  The patient presented to the emergency room with acute onset of abdominal pain with intractable nausea and vomiting as well as constipation for over 1 week.  A CT of the abdomen/pelvis without contrast was performed which showed a small and large bowel obstruction that appeared to be secondary to a colon mass in the ascending colon, there were also a few mildly enlarged mesenteric lymph nodes in the right lower quadrant and right mid abdomen, status post nephrectomy.  Colonoscopy was performed on 08/29/2020 which showed a likely malignant mostly obstructing tumor in the ascending colon which was biopsied.  Biopsy showed poorly differentiated adenocarcinoma with signet ring features.  IHC positive for CDX2 and cytokeratin 20 and negative for cytokeratin 7 and GATA3.  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  The patient was also seen by general surgery.  CEA was normal on 08/29/2020 at 3.7. She also underwent a CT chest on 08/29/2020 which showed a 4 x 5 mm lingular nodule which has been slowly enlarging over the last 2 years but still too small to characterize by PET/CT, stable paratracheal and right hilar adenopathy, small right and trace left pleural effusions.  The patient was taken to the OR on 08/30/2020 and is status post exploratory laparotomy, right hemicolectomy, ileostomy, creation.  Surgical pathology is pending.  [name] was seen today during  dialysis.  She reports that her abdominal pain is well controlled at this time.  Prior to admission, she was having significant abdominal pain with nausea and vomiting.  She has no longer having nausea and vomiting.  She was also having difficulty with constipation prior to admission.  The patient tells me that she has had a poor appetite and a weight loss recently.  She is not having any fevers or chills.  She denies chest pain, shortness of breath, cough.  The patient has been treated for ulcers and osteomyelitis of her bilateral third digits.  She was scheduled for amputation of her right third toe next week.  I believe this is canceled at this point in time.  The patient is single.  She has 2 adult children but states that they do not help her much.  Her sister is available to help when needed.  Denies history of alcohol and tobacco use.  Medical oncology was asked see the patient for recommendations regarding her newly diagnosed colon adenocarcinoma.   Past Medical History:  Diagnosis Date  . Anemia   . Anemia associated with chronic renal failure   . Anemia in chronic kidney disease 09/29/2018  . Arthritis   . Asthma   . Blood transfusion without reported diagnosis    Phreesia 02/27/2020  . Cancer ([place])    Phreesia 02/27/2020  . Cataract    OD  . Chronic kidney disease    Phreesia 02/27/2020  . Coronary artery calcification seen on CAT scan 02/17/2018   Coronary calcification on CT  . Depression   . Diabetes mellitus without complication ([place])    Phreesia 02/27/2020  . Diabetic retinopathy of both eyes ([place])   . Diabetic ulcer  of right foot associated with type 2 diabetes mellitus ([place])   . ESRD (end stage renal disease) on dialysis ([place])   . Essential hypertension 02/17/2018   Essential hypertension  . GERD (gastroesophageal reflux disease)    pt denies  . HLD (hyperlipidemia)   . Hypertension   . Hypertensive retinopathy    OU  . Left ventricular dysfunction 04/07/2018   Left  ventricle dysfunction  . Microalbuminuria due to type 2 diabetes mellitus ([place])   . Neuromuscular disorder ([place])    diabetic neuropathy  . Nonproliferative retinopathy due to secondary diabetes ([place])   . Nonsustained ventricular tachycardia ([place]) 08/28/2018   Nonsustained ventricular tachycardia  . Pneumonia    hx of   . Renal cell cancer ([place])   . Renal insufficiency   . Right renal mass 03/10/2017  . Type 2 diabetes, controlled, with neuropathy ([place]) 06/22/2013  . Ulcer of other part of foot 06/22/2013  . 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 ([place])   :  Past Surgical History:  Procedure Laterality Date  . AMPUTATION TOE Right 10/29/2019   Procedure: RIGHT 2ND TOE AMPUTATION;  Surgeon: Edrick Kins, DPM;  Location: [facility];  Service: Podiatry;  Laterality: Right;  . BASCILIC VEIN TRANSPOSITION Left 08/08/2017   Procedure: BASILIC VEIN TRANSPOSITION FIRST STAGE LEFT ARM;  Surgeon: Serafina Mitchell, MD;  Location: [facility];  Service: Vascular;  Laterality: Left;  . BASCILIC VEIN TRANSPOSITION Left 05/14/2018   Procedure: SECOND STAGE BASILIC VEIN TRANSPOSITION LEFT ARM;  Surgeon: Serafina Mitchell, MD;  Location: [facility];  Service: Vascular;  Laterality: Left;  . BIOPSY  08/29/2020   Procedure: BIOPSY;  Surgeon: Yetta Flock, MD;  Location: [facility];  Service: Gastroenterology;;  . CATARACT EXTRACTION Left   . COLONOSCOPY WITH PROPOFOL N/A 08/29/2020   Procedure: COLONOSCOPY WITH PROPOFOL;  Surgeon: Yetta Flock, MD;  Location: [facility];  Service: Gastroenterology;  Laterality: N/A;  . EYE SURGERY    . ILEOSTOMY Right 08/30/2020   Procedure: ILEOSTOMY;  Surgeon: Jesusita Oka, MD;  Location: [facility];  Service: General;  Laterality: Right;  . PARTIAL COLECTOMY N/A 08/30/2020   Procedure: HEMI COLECTOMY;  Surgeon: Jesusita Oka, MD;  Location: [facility];  Service: General;  Laterality: N/A;  . POLYPECTOMY  08/29/2020   Procedure: POLYPECTOMY;  Surgeon: Yetta Flock, MD;  Location: [facility];  Service: Gastroenterology;;  . ROBOTIC ADRENALECTOMY Left 03/10/2017   Procedure: XI ROBOTIC ADRENALECTOMY;  Surgeon: Nickie Retort, MD;  Location: [facility];  Service: Urology;  Laterality: Left;  . ROBOTIC ASSITED PARTIAL NEPHRECTOMY Right 03/10/2017   Procedure: XI ROBOTIC ASSITED RADICAL NEPHRECTOMY;  Surgeon: Nickie Retort, MD;  Location: [facility];  Service: Urology;  Laterality: Right;  . SUBMUCOSAL TATTOO INJECTION  08/29/2020   Procedure: SUBMUCOSAL TATTOO INJECTION;  Surgeon: Yetta Flock, MD;  Location: [facility];  Service: Gastroenterology;;  . TOE AMPUTATION Bilateral    both great toe ,, left foot 2nd toe 1/2  :  Current Facility-Administered Medications  Medication Dose Route Frequency Provider Last Rate Last Admin  . 0.9 %  sodium chloride infusion   Intravenous Continuous Brennan Bailey, MD 10 mL/hr at 08/30/20 1800 10 mL/hr at 08/30/20 1800  . 0.9 %  sodium chloride infusion  10 mL/hr Intravenous Once Thelma Comp, CRNA      . acetaminophen (TYLENOL) tablet 1,000 mg  1,000 mg Per Tube Q6H Jesusita Oka, MD      .  albuterol (VENTOLIN HFA) 108 (90 Base) MCG/ACT inhaler 2 puff  2 puff Inhalation Q4H PRN Mansy, Jan A, MD      . ceFEPIme (MAXIPIME) 2 g in sodium chloride 0.9 % 100 mL IVPB  2 g Intravenous Q T,Th,Sa-HD Hammons, Kimberly B, RPH      . chlorhexidine (PERIDEX) 0.12 % solution 15 mL  15 mL Mouth/Throat NOW Howze, Leanord Hawking, MD      . Chlorhexidine Gluconate Cloth 2 % PADS 6 each  6 each Topical Q0600 Dwana Melena, MD   6 each at 08/30/20 0715  . dextrose 5 % solution   Intravenous Continuous Shela Leff, MD 50 mL/hr at 08/30/20 1130 Restarted at 08/30/20 1130  . diphenhydrAMINE (BENADRYL) capsule 25 mg  25 mg Oral Daily PRN Mansy, Jan A, MD      . doxercalciferol (HECTOROL) injection 3 mcg  3 mcg Intravenous Q T,Th,Sa-HD Zeyfang, David, PA-C      . fluticasone furoate-vilanterol (BREO ELLIPTA) 100-25  MCG/INH 1 puff  1 puff Inhalation Daily Mansy, Jan A, MD   1 puff at 08/31/20 [phone]  . gabapentin (NEURONTIN) capsule 300 mg  300 mg Oral TID Mansy, Jan A, MD   300 mg at 08/29/20 2131  . heparin injection 5,000 Units  5,000 Units Subcutaneous Q8H Mansy, Arvella Merles, MD   5,000 Units at 08/30/20 2217  . HYDROmorphone (DILAUDID) injection 0.5-1 mg  0.5-1 mg Intravenous Q2H PRN Jesusita Oka, MD      . insulin aspart (novoLOG) injection 0-15 Units  0-15 Units Subcutaneous Q4H Eugenie Filler, MD   5 Units at 08/31/20 [phone]  . iohexol (OMNIPAQUE) 300 MG/ML solution 100 mL  100 mL Intravenous Once PRN Truddie Hidden, MD      . magnesium hydroxide (MILK OF MAGNESIA) suspension 30 mL  30 mL Oral Daily PRN Mansy, Jan A, MD      . MEDLINE mouth rinse  15 mL Mouth Rinse BID Alma Friendly, MD   15 mL at 08/31/20 0934  . methocarbamol (ROBAXIN) tablet 1,000 mg  1,000 mg Per Tube Q8H Jesusita Oka, MD      . multivitamin (RENA-VIT) tablet 1 tablet  1 tablet Oral QHS Mansy, Arvella Merles, MD   1 tablet at 08/29/20 2131  . mupirocin ointment (BACTROBAN) 2 % 1 application  1 application Nasal BID Jesusita Oka, MD   1 application at 70/62/37 0934  . ondansetron (ZOFRAN) tablet 4 mg  4 mg Oral Q6H PRN Mansy, Jan A, MD       Or  . ondansetron [facility]) injection 4 mg  4 mg Intravenous Q6H PRN Mansy, Jan A, MD   4 mg at 08/28/20 1820  . oxyCODONE (ROXICODONE) 5 MG/5ML solution 5-10 mg  5-10 mg Per Tube Q4H PRN Jesusita Oka, MD      . pantoprazole (PROTONIX) injection 40 mg  40 mg Intravenous Q24H Carl Best M, NP   40 mg at 08/29/20 1659  . polyvinyl alcohol (LIQUIFILM TEARS) 1.4 % ophthalmic solution 1 drop  1 drop Both Eyes Daily PRN Mansy, Jan A, MD      . traZODone (DESYREL) tablet 25 mg  25 mg Oral QHS PRN Mansy, Jan A, MD      . vancomycin (VANCOCIN) IVPB 1000 mg/200 mL premix  1,000 mg Intravenous Q T,Th,Sa-HD Hammons, Kimberly B, RPH         Allergies  Allergen Reactions  . Latex  Itching  . Penicillins  Other (See Comments)    UNSPECIFIED CHILDHOOD REACTION  Has patient had a PCN reaction causing immediate rash, facial/tongue/throat swelling, SOB or lightheadedness with hypotension: Unknown Has patient had a PCN reaction causing severe rash involving mucus membranes or skin necrosis: Unknown Has patient had a PCN reaction that required hospitalization: Unknown Has patient had a PCN reaction occurring within the last 10 years: Unknown If all of the above answers are "NO", then may proceed with Cephalosporin use.   . Adhesive [Tape] Rash  :  Family History  Problem Relation Age of Onset  . Heart disease Mother   . Diabetes Sister   :  Social History   Socioeconomic History  . Marital status: Single    Spouse name: Not on file  . Number of children: 2  . Years of education: 84  . Highest education level: Not on file  Occupational History  . Not on file  Tobacco Use  . Smoking status: Never Smoker  . Smokeless tobacco: Never Used  Vaping Use  . Vaping Use: Never used  Substance and Sexual Activity  . Alcohol use: Yes    Alcohol/week: 0.0 standard drinks    Comment: occ  . Drug use: No  . Sexual activity: Not Currently    Partners: Male  Other Topics Concern  . Not on file  Social History Narrative  . Not on file   Social Determinants of Health   Financial Resource Strain: Low Risk   . Difficulty of Paying Living Expenses: Not very hard  Food Insecurity: Not on file  Transportation Needs: Not on file  Physical Activity: Not on file  Stress: Not on file  Social Connections: Not on file  Intimate Partner Violence: Not on file  :  Review of Systems: A comprehensive 14 point review of systems was negative except as noted in the HPI.  Exam: Patient Vitals for the past 24 hrs:  BP Temp Temp src Pulse Resp SpO2 Height Weight  08/31/20 0900 (!) 107/52 99 F (37.2 C) Oral (!) 101 18 98 % -- --  08/31/20 0828 -- -- -- -- -- 97 % -- --   08/31/20 0415 (!) 94/50 98 F (36.7 C) -- 100 18 97 % -- --  08/31/20 0403 (!) 93/46 98 F (36.7 C) Oral (!) 103 16 96 % -- --  08/30/20 2048 (!) 91/52 97.8 F (36.6 C) Oral 100 18 100 % -- --  08/30/20 1728 (!) 105/36 (!) 97.4 F (36.3 C) Oral 86 18 90 % -- --  08/30/20 1705 (!) 101/43 -- -- 84 15 96 % -- --  08/30/20 1700 (!) 102/46 -- -- 85 15 92 % -- --  08/30/20 1655 (!) 114/42 -- -- 85 15 94 % -- --  08/30/20 1650 (!) 113/45 -- -- 84 16 94 % -- --  08/30/20 1645 (!) 116/48 -- -- 85 15 98 % -- --  08/30/20 1640 (!) 126/45 97.8 F (36.6 C) -- 83 (!) 22 97 % -- --  08/30/20 1636 (!) 113/45 -- -- 83 16 95 % -- --  08/30/20 1630 (!) 105/37 -- -- 83 15 96 % -- --  08/30/20 1625 (!) 105/37 97.6 F (36.4 C) -- 84 15 96 % -- --  08/30/20 1621 (!) 106/30 -- -- 83 15 97 % -- --  08/30/20 1615 107/60 97.8 F (36.6 C) -- 83 15 100 % -- --  08/30/20 1611 (!) 109/49 97.8 F (36.6 C) -- 83 15 100 % -- --  08/30/20 1610 (!) 109/49 -- -- 83 17 100 % -- --  08/30/20 1609 (!) 114/34 -- -- 84 17 100 % -- --  08/30/20 1605 (!) 123/41 97.8 F (36.6 C) -- 82 16 100 % -- --  08/30/20 1559 (!) 100/37 97.6 F (36.4 C) -- 83 18 100 % -- --  08/30/20 1555 (!) 69/40 -- -- 84 18 100 % -- --  08/30/20 1540 (!) 77/54 97.6 F (36.4 C) -- 86 16 100 % -- --  08/30/20 1211 -- -- -- -- -- -- 5\' 7"  (1.702 m) 88.7 kg    General: Chronically ill-appearing female, no distress Eyes: EOMI, PERRL, no scleral icterus.   ENT:  There were no oropharyngeal lesions.    Lymphatics:  Negative cervical, supraclavicular or axillary adenopathy.   Respiratory: lungs were clear bilaterally without wheezing or crackles.   Cardiovascular:  Regular rate and rhythm, S1/S2, without murmur, rub or gallop.  Trace bilateral lower extremity edema. GI: Hypoactive bowel sounds, soft, generalized tenderness near incision site, stoma is red and viable Musculoskeletal:  no spinal tenderness of palpation of vertebral spine.   Skin:  Ulcer noted on right third toe. Neuro exam was nonfocal. Patient was alert and oriented.  Attention was good. Language was appropriate.  Mood was normal without depression.  Speech was not pressured.  Thought content was not tangential.     Lab Results  Component Value Date   WBC 11.5 (H) 08/31/2020   HGB 10.0 (L) 08/31/2020   HCT 31.3 (L) 08/31/2020   PLT 139 (L) 08/31/2020   GLUCOSE 224 (H) 08/31/2020   CHOL 119 02/07/2020   TRIG 101 02/07/2020   HDL 33 (L) 02/07/2020   LDLCALC 68 02/07/2020   ALT 11 08/28/2020   AST 17 08/28/2020   NA 141 08/31/2020   K 5.2 (H) 08/31/2020   CL 104 08/31/2020   CREATININE 8.03 (H) 08/31/2020   BUN 56 (H) 08/31/2020   CO2 15 (L) 08/31/2020    CT ABDOMEN PELVIS WO CONTRAST  Result Date: 08/27/2020 CLINICAL DATA:  Nephrectomy for right-sided renal cell carcinoma. Concern for bowel obstruction. EXAM: CT ABDOMEN AND PELVIS WITHOUT CONTRAST TECHNIQUE: Multidetector CT imaging of the abdomen and pelvis was performed following the standard protocol without IV contrast. COMPARISON:  June 28, 2020 FINDINGS: Lower chest: The lung bases are clear. The heart size is normal. Hepatobiliary: The liver is normal. Normal gallbladder.There is no biliary ductal dilation. Pancreas: Normal contours without ductal dilatation. No peripancreatic fluid collection. Spleen: Unremarkable. Adrenals/Urinary Tract: --Adrenal glands: Unremarkable. --Right kidney/ureter: The right kidney is surgically absent. --Left kidney/ureter: The left kidney appears atrophic. --Urinary bladder: Unremarkable. Stomach/Bowel: --Stomach/Duodenum: No hiatal hernia or other gastric abnormality. Normal duodenal course and caliber. --Small bowel: There are mildly dilated loops of small bowel scattered throughout the abdomen to the level of the patient's ileocecal valve. --Colon: There is moderate distention of the cecum. There is an above transition point at the level of the ascending colon with there is  a probable underlying colonic mass. The remaining portions of the patient's colon are completely decompressed aside from the rectum. --Appendix: Normal. Vascular/Lymphatic: Atherosclerotic calcification is present within the non-aneurysmal abdominal aorta, without hemodynamically significant stenosis. --No retroperitoneal lymphadenopathy. --there are few mildly enlarged mesenteric lymph nodes in the right lower quadrant and right mid abdomen. --No pelvic or inguinal lymphadenopathy. Reproductive: Unremarkable Other: No ascites or free air. The abdominal wall is normal. Musculoskeletal. No acute displaced fractures. IMPRESSION: 1. Small and large bowel  obstruction that appears to be secondary to the previously demonstrated colonic mass in the ascending colon. 2. There are few mildly enlarged mesenteric lymph nodes in the right lower quadrant and right mid abdomen, concerning for nodal metastatic disease. 3. Status post right nephrectomy. 4. Atrophic left kidney. Aortic Atherosclerosis (ICD10-I70.0). Electronically Signed   By: Constance Holster M.D.   On: 08/27/2020 19:51   DG Abdomen 1 View  Result Date: 08/28/2020 CLINICAL DATA:  Nasogastric tube placement. EXAM: ABDOMEN - 1 VIEW COMPARISON:  CT abdomen and pelvis 08/27/2020 FINDINGS: The tip of the nasogastric tube is near the GE junction. There is gas in the stomach. Gas within bowel loops in the mid abdomen and left lower abdomen. Limited evaluation for free air on the supine image. IMPRESSION: 1. Nasogastric tube tip is near the GE junction. 2. Persistent gas-filled loops of bowel in the abdomen. Findings remain compatible with a bowel obstruction. Electronically Signed   By: Markus Daft M.D.   On: 08/28/2020 07:49   CT CHEST W CONTRAST  Result Date: 08/29/2020 CLINICAL DATA:  Staging workup of ascending colon cancer. Prior right renal cell carcinoma. EXAM: CT CHEST WITH CONTRAST TECHNIQUE: Multidetector CT imaging of the chest was performed during  intravenous contrast administration. CONTRAST:  67mL OMNIPAQUE IOHEXOL 300 MG/ML  SOLN COMPARISON:  CT abdomen 08/27/2020 and CT chest 06/28/2020 FINDINGS: Cardiovascular: Coronary, aortic arch, and branch vessel atherosclerotic vascular disease. Mildly prominent right ventricle and overall mild cardiomegaly. Mitral and aortic valve calcifications. Mediastinum/Nodes: Lower paratracheal node anterior to the carina measures 2.1 cm in short axis on image 57 series 3, previously the same by my measurements. Right hilar lymph node measures 1.2 cm in short axis on image 62 of series 3, previously likely similar on 06/28/2020 although difficult to measure on that exam due to the lack of IV contrast. A nasogastric tube is present and terminates within a type 1 hiatal hernia. Lungs/Pleura: Small right and trace left pleural effusions. Mild scarring or atelectasis in the lingula and lower lobes. 4 by 5 mm lingular nodule on image 82 series 4, measuring 0.3 cm in diameter on 11/04/2018 and 05/13/2019 and 0.4 by 0.3 cm on 12/01/2019. Accordingly this nodule is slowly enlarging and merits surveillance, but is still too small for PET CT characterization. Upper Abdomen: Perihepatic ascites. Left adrenalectomy. Atherosclerosis. Musculoskeletal: Degenerative glenohumeral arthropathy bilaterally. Thoracic spondylosis. IMPRESSION: 1. 4 by 5 mm lingular nodule, slowly enlarging over the last 2 years, but still too small for PET CT characterization. This could be inflammatory or neoplastic. Surveillance is likely warranted. 2. Stable paratracheal and right hilar adenopathy. 3. Small right and trace left pleural effusions. 4. Other imaging findings of potential clinical significance: Coronary, aortic arch, and branch vessel atherosclerotic vascular disease. Mildly prominent right ventricle and overall mild cardiomegaly. Mitral and aortic valve calcifications. Perihepatic ascites. Left adrenalectomy. Degenerative glenohumeral arthropathy  bilaterally. 5. Aortic atherosclerosis. Aortic Atherosclerosis (ICD10-I70.0). Electronically Signed   By: Van Clines M.D.   On: 08/29/2020 19:22   DG Foot Complete Left  Result Date: 08/10/2020 CLINICAL DATA:  Nonhealing foot wounds EXAM: LEFT FOOT - COMPLETE 3+ VIEW COMPARISON:  04/05/2020 FINDINGS: Changes consistent with prior amputation of the first and second toe stable in appearance from the prior exam. Flattening of the plantar arch is noted. Tarsal degenerative changes are seen. Calcaneal spurring is noted. Chronic regularity at the third MTP joint is noted stable from the prior exam. No bony erosive changes are identified to suggest osteomyelitis. IMPRESSION: Postsurgical  changes. No erosive changes to suggest osteomyelitis are seen. Electronically Signed   By: Inez Catalina M.D.   On: 08/10/2020 21:23   DG Foot Complete Right  Result Date: 08/10/2020 CLINICAL DATA:  Foot pain and nonhealing wounds EXAM: RIGHT FOOT COMPLETE - 3+ VIEW COMPARISON:  11/15/2019 FINDINGS: Changes of amputation of the first and second digits are again seen and stable. Healed fracture of the second metatarsal distally is seen. Generalized osteopenia is noted. Flattening of the plantar arch and tarsal degenerative changes are seen. Air is noted within the soft tissues in the previous amputation site consistent with the given clinical history of nonhealing wound. No definitive erosive changes to suggest osteomyelitis are noted. IMPRESSION: Chronic bony changes without acute abnormality. Soft tissue gas is noted consistent with the nonhealing wound. No definitive osteomyelitis is seen. Electronically Signed   By: Inez Catalina M.D.   On: 08/10/2020 21:25     CT ABDOMEN PELVIS WO CONTRAST  Result Date: 08/27/2020 CLINICAL DATA:  Nephrectomy for right-sided renal cell carcinoma. Concern for bowel obstruction. EXAM: CT ABDOMEN AND PELVIS WITHOUT CONTRAST TECHNIQUE: Multidetector CT imaging of the abdomen and pelvis  was performed following the standard protocol without IV contrast. COMPARISON:  June 28, 2020 FINDINGS: Lower chest: The lung bases are clear. The heart size is normal. Hepatobiliary: The liver is normal. Normal gallbladder.There is no biliary ductal dilation. Pancreas: Normal contours without ductal dilatation. No peripancreatic fluid collection. Spleen: Unremarkable. Adrenals/Urinary Tract: --Adrenal glands: Unremarkable. --Right kidney/ureter: The right kidney is surgically absent. --Left kidney/ureter: The left kidney appears atrophic. --Urinary bladder: Unremarkable. Stomach/Bowel: --Stomach/Duodenum: No hiatal hernia or other gastric abnormality. Normal duodenal course and caliber. --Small bowel: There are mildly dilated loops of small bowel scattered throughout the abdomen to the level of the patient's ileocecal valve. --Colon: There is moderate distention of the cecum. There is an above transition point at the level of the ascending colon with there is a probable underlying colonic mass. The remaining portions of the patient's colon are completely decompressed aside from the rectum. --Appendix: Normal. Vascular/Lymphatic: Atherosclerotic calcification is present within the non-aneurysmal abdominal aorta, without hemodynamically significant stenosis. --No retroperitoneal lymphadenopathy. --there are few mildly enlarged mesenteric lymph nodes in the right lower quadrant and right mid abdomen. --No pelvic or inguinal lymphadenopathy. Reproductive: Unremarkable Other: No ascites or free air. The abdominal wall is normal. Musculoskeletal. No acute displaced fractures. IMPRESSION: 1. Small and large bowel obstruction that appears to be secondary to the previously demonstrated colonic mass in the ascending colon. 2. There are few mildly enlarged mesenteric lymph nodes in the right lower quadrant and right mid abdomen, concerning for nodal metastatic disease. 3. Status post right nephrectomy. 4. Atrophic left  kidney. Aortic Atherosclerosis (ICD10-I70.0). Electronically Signed   By: Constance Holster M.D.   On: 08/27/2020 19:51   DG Abdomen 1 View  Result Date: 08/28/2020 CLINICAL DATA:  Nasogastric tube placement. EXAM: ABDOMEN - 1 VIEW COMPARISON:  CT abdomen and pelvis 08/27/2020 FINDINGS: The tip of the nasogastric tube is near the GE junction. There is gas in the stomach. Gas within bowel loops in the mid abdomen and left lower abdomen. Limited evaluation for free air on the supine image. IMPRESSION: 1. Nasogastric tube tip is near the GE junction. 2. Persistent gas-filled loops of bowel in the abdomen. Findings remain compatible with a bowel obstruction. Electronically Signed   By: Markus Daft M.D.   On: 08/28/2020 07:49   CT CHEST W CONTRAST  Result Date: 08/29/2020  CLINICAL DATA:  Staging workup of ascending colon cancer. Prior right renal cell carcinoma. EXAM: CT CHEST WITH CONTRAST TECHNIQUE: Multidetector CT imaging of the chest was performed during intravenous contrast administration. CONTRAST:  65mL OMNIPAQUE IOHEXOL 300 MG/ML  SOLN COMPARISON:  CT abdomen 08/27/2020 and CT chest 06/28/2020 FINDINGS: Cardiovascular: Coronary, aortic arch, and branch vessel atherosclerotic vascular disease. Mildly prominent right ventricle and overall mild cardiomegaly. Mitral and aortic valve calcifications. Mediastinum/Nodes: Lower paratracheal node anterior to the carina measures 2.1 cm in short axis on image 57 series 3, previously the same by my measurements. Right hilar lymph node measures 1.2 cm in short axis on image 62 of series 3, previously likely similar on 06/28/2020 although difficult to measure on that exam due to the lack of IV contrast. A nasogastric tube is present and terminates within a type 1 hiatal hernia. Lungs/Pleura: Small right and trace left pleural effusions. Mild scarring or atelectasis in the lingula and lower lobes. 4 by 5 mm lingular nodule on image 82 series 4, measuring 0.3 cm in  diameter on 11/04/2018 and 05/13/2019 and 0.4 by 0.3 cm on 12/01/2019. Accordingly this nodule is slowly enlarging and merits surveillance, but is still too small for PET CT characterization. Upper Abdomen: Perihepatic ascites. Left adrenalectomy. Atherosclerosis. Musculoskeletal: Degenerative glenohumeral arthropathy bilaterally. Thoracic spondylosis. IMPRESSION: 1. 4 by 5 mm lingular nodule, slowly enlarging over the last 2 years, but still too small for PET CT characterization. This could be inflammatory or neoplastic. Surveillance is likely warranted. 2. Stable paratracheal and right hilar adenopathy. 3. Small right and trace left pleural effusions. 4. Other imaging findings of potential clinical significance: Coronary, aortic arch, and branch vessel atherosclerotic vascular disease. Mildly prominent right ventricle and overall mild cardiomegaly. Mitral and aortic valve calcifications. Perihepatic ascites. Left adrenalectomy. Degenerative glenohumeral arthropathy bilaterally. 5. Aortic atherosclerosis. Aortic Atherosclerosis (ICD10-I70.0). Electronically Signed   By: Van Clines M.D.   On: 08/29/2020 19:22   DG Foot Complete Left  Result Date: 08/10/2020 CLINICAL DATA:  Nonhealing foot wounds EXAM: LEFT FOOT - COMPLETE 3+ VIEW COMPARISON:  04/05/2020 FINDINGS: Changes consistent with prior amputation of the first and second toe stable in appearance from the prior exam. Flattening of the plantar arch is noted. Tarsal degenerative changes are seen. Calcaneal spurring is noted. Chronic regularity at the third MTP joint is noted stable from the prior exam. No bony erosive changes are identified to suggest osteomyelitis. IMPRESSION: Postsurgical changes. No erosive changes to suggest osteomyelitis are seen. Electronically Signed   By: Inez Catalina M.D.   On: 08/10/2020 21:23   DG Foot Complete Right  Result Date: 08/10/2020 CLINICAL DATA:  Foot pain and nonhealing wounds EXAM: RIGHT FOOT COMPLETE - 3+  VIEW COMPARISON:  11/15/2019 FINDINGS: Changes of amputation of the first and second digits are again seen and stable. Healed fracture of the second metatarsal distally is seen. Generalized osteopenia is noted. Flattening of the plantar arch and tarsal degenerative changes are seen. Air is noted within the soft tissues in the previous amputation site consistent with the given clinical history of nonhealing wound. No definitive erosive changes to suggest osteomyelitis are noted. IMPRESSION: Chronic bony changes without acute abnormality. Soft tissue gas is noted consistent with the nonhealing wound. No definitive osteomyelitis is seen. Electronically Signed   By: Inez Catalina M.D.   On: 08/10/2020 21:25   Assessment and Plan:   [name] is a 70 year old female who presented to the emergency room with abdominal pain, nausea, vomiting.  She  was found to have a bowel obstruction. She has a new diagnosis of colon adenocarcinoma based on the biopsy from the colonoscopy.  She is status post exploratory laparotomy, right hemicolectomy, ileostomy creation on 08/30/2020.  Surgical path is currently pending.  CT scans did not show evidence of definitive distant metastasis.  CEA was normal.  #Colon adenocarcinoma --Discussed the biopsy findings and CT scan findings with the patient.  We discussed her diagnosis but are awaiting surgical pathology to further discuss staging. --Once we have the final surgical pathology, we can discuss prognosis and potential treatment options. --Given her multiple comorbid issues, treatment options may be limited. --Recommend palliative care consult to assist with goals of care discussion.  #End-stage renal disease on hemodialysis --Continue dialysis per nephrology.  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 --Ongoing management per hospitalist/primary care provider once discharged.  #Right third toe cellulitis --The patient is followed by Dr. Amalia Hailey with podiatry. --She may have possible amputation of her right third toe this admission. --Remains on antibiotics.  Thank you for this referral.   Mikey Bussing, DNP, AGPCNP-BC, AOCNP

## 2020-09-01 DIAGNOSIS — Z66 Do not resuscitate: Secondary | ICD-10-CM | POA: Diagnosis not present

## 2020-09-01 DIAGNOSIS — L97519 Non-pressure chronic ulcer of other part of right foot with unspecified severity: Secondary | ICD-10-CM | POA: Diagnosis not present

## 2020-09-01 DIAGNOSIS — Z515 Encounter for palliative care: Secondary | ICD-10-CM | POA: Diagnosis not present

## 2020-09-01 DIAGNOSIS — C182 Malignant neoplasm of ascending colon: Secondary | ICD-10-CM | POA: Diagnosis not present

## 2020-09-01 DIAGNOSIS — M86671 Other chronic osteomyelitis, right ankle and foot: Secondary | ICD-10-CM

## 2020-09-01 DIAGNOSIS — N186 End stage renal disease: Secondary | ICD-10-CM | POA: Diagnosis not present

## 2020-09-01 DIAGNOSIS — M86672 Other chronic osteomyelitis, left ankle and foot: Secondary | ICD-10-CM

## 2020-09-01 DIAGNOSIS — Z7189 Other specified counseling: Secondary | ICD-10-CM | POA: Diagnosis not present

## 2020-09-01 DIAGNOSIS — K56600 Partial intestinal obstruction, unspecified as to cause: Secondary | ICD-10-CM | POA: Diagnosis not present

## 2020-09-01 LAB — CBC WITH DIFFERENTIAL/PLATELET
Abs Immature Granulocytes: 0.09 10*3/uL — ABNORMAL HIGH (ref 0.00–0.07)
Basophils Absolute: 0 10*3/uL (ref 0.0–0.1)
Basophils Relative: 0 %
Eosinophils Absolute: 0.1 10*3/uL (ref 0.0–0.5)
Eosinophils Relative: 1 %
HCT: 25 % — ABNORMAL LOW (ref 36.0–46.0)
Hemoglobin: 8.1 g/dL — ABNORMAL LOW (ref 12.0–15.0)
Immature Granulocytes: 1 %
Lymphocytes Relative: 13 %
Lymphs Abs: 0.9 10*3/uL (ref 0.7–4.0)
MCH: 31.6 pg (ref 26.0–34.0)
MCHC: 32.4 g/dL (ref 30.0–36.0)
MCV: 97.7 fL (ref 80.0–100.0)
Monocytes Absolute: 0.4 10*3/uL (ref 0.1–1.0)
Monocytes Relative: 5 %
Neutro Abs: 5.5 10*3/uL (ref 1.7–7.7)
Neutrophils Relative %: 80 %
Platelets: 90 10*3/uL — ABNORMAL LOW (ref 150–400)
RBC: 2.56 MIL/uL — ABNORMAL LOW (ref 3.87–5.11)
RDW: 15.5 % (ref 11.5–15.5)
WBC: 6.9 10*3/uL (ref 4.0–10.5)
nRBC: 0 % (ref 0.0–0.2)

## 2020-09-01 LAB — BASIC METABOLIC PANEL
Anion gap: 16 — ABNORMAL HIGH (ref 5–15)
BUN: 31 mg/dL — ABNORMAL HIGH (ref 8–23)
CO2: 24 mmol/L (ref 22–32)
Calcium: 8.1 mg/dL — ABNORMAL LOW (ref 8.9–10.3)
Chloride: 101 mmol/L (ref 98–111)
Creatinine, Ser: 5.52 mg/dL — ABNORMAL HIGH (ref 0.44–1.00)
GFR, Estimated: 8 mL/min — ABNORMAL LOW (ref >60)
Glucose, Bld: 127 mg/dL — ABNORMAL HIGH (ref 70–99)
Potassium: 4.2 mmol/L (ref 3.5–5.1)
Sodium: 141 mmol/L (ref 135–145)

## 2020-09-01 LAB — GLUCOSE, CAPILLARY
Glucose-Capillary: 123 mg/dL — ABNORMAL HIGH (ref 70–99)
Glucose-Capillary: 109 mg/dL — ABNORMAL HIGH (ref 70–99)
Glucose-Capillary: 223 mg/dL — ABNORMAL HIGH (ref 70–99)
Glucose-Capillary: 158 mg/dL — ABNORMAL HIGH (ref 70–99)
Glucose-Capillary: 153 mg/dL — ABNORMAL HIGH (ref 70–99)

## 2020-09-01 LAB — MAGNESIUM: Magnesium: 2 mg/dL (ref 1.7–2.4)

## 2020-09-01 MED ORDER — GABAPENTIN 300 MG PO CAPS
300.0000 mg | ORAL_CAPSULE | Freq: Three times a day (TID) | ORAL | Status: DC
  Administered 2020-09-01 – 2020-09-06 (×16): 300 mg via ORAL
  Filled 2020-09-01 (×17): qty 1

## 2020-09-01 MED ORDER — ONDANSETRON HCL 4 MG/2ML IJ SOLN: 4.0000 mg | Freq: Four times a day (QID) | INTRAMUSCULAR | Status: DC | PRN

## 2020-09-01 MED ORDER — RENA-VITE PO TABS
1.0000 | ORAL_TABLET | Freq: Every day | ORAL | Status: DC
  Administered 2020-09-01 – 2020-09-06 (×6): 1 via ORAL
  Filled 2020-09-01 (×6): qty 1

## 2020-09-01 MED ORDER — GABAPENTIN 250 MG/5ML PO SOLN
300.0000 mg | Freq: Three times a day (TID) | ORAL | Status: DC
  Filled 2020-09-01 (×3): qty 6

## 2020-09-01 MED ORDER — ONDANSETRON HCL 4 MG/2ML IJ SOLN
4.0000 mg | Freq: Four times a day (QID) | INTRAMUSCULAR | Status: DC | PRN
  Administered 2020-09-06: 4 mg via INTRAVENOUS
  Filled 2020-09-01: qty 2

## 2020-09-01 MED ORDER — TRAZODONE HCL 50 MG PO TABS
25.0000 mg | ORAL_TABLET | Freq: Every evening | ORAL | Status: DC | PRN
  Administered 2020-09-01 – 2020-09-03 (×3): 25 mg via ORAL
  Filled 2020-09-01 (×3): qty 1

## 2020-09-01 MED ORDER — ACETAMINOPHEN 500 MG PO TABS
1000.0000 mg | ORAL_TABLET | Freq: Four times a day (QID) | ORAL | Status: DC
  Administered 2020-09-01 – 2020-09-06 (×16): 1000 mg via ORAL
  Filled 2020-09-01 (×21): qty 2

## 2020-09-01 MED ORDER — OXYCODONE HCL 5 MG/5ML PO SOLN: 5.0000 mg | ORAL | Status: DC | PRN

## 2020-09-01 MED ORDER — ONDANSETRON HCL 4 MG PO TABS: 4.0000 mg | ORAL_TABLET | Freq: Four times a day (QID) | ORAL | Status: DC | PRN

## 2020-09-01 MED ORDER — METHOCARBAMOL 500 MG PO TABS
1000.0000 mg | ORAL_TABLET | Freq: Three times a day (TID) | ORAL | Status: DC
  Administered 2020-09-01 – 2020-09-06 (×14): 1000 mg via ORAL
  Filled 2020-09-01 (×15): qty 2

## 2020-09-01 MED ORDER — GABAPENTIN 250 MG/5ML PO SOLN
300.0000 mg | Freq: Three times a day (TID) | ORAL | Status: DC
  Administered 2020-09-01: 300 mg
  Filled 2020-09-01 (×3): qty 6

## 2020-09-01 MED ORDER — RENA-VITE PO TABS: 1.0000 | ORAL_TABLET | Freq: Every day | ORAL | Status: DC

## 2020-09-01 MED ORDER — DIPHENHYDRAMINE HCL 12.5 MG/5ML PO ELIX: 25.0000 mg | ORAL_SOLUTION | Freq: Every day | ORAL | Status: DC | PRN

## 2020-09-01 MED ORDER — CHLORHEXIDINE GLUCONATE CLOTH 2 % EX PADS
6.0000 | MEDICATED_PAD | Freq: Every day | CUTANEOUS | Status: DC
  Administered 2020-09-01 – 2020-09-02 (×2): 6 via TOPICAL

## 2020-09-01 MED ORDER — MIDODRINE HCL 5 MG PO TABS
5.0000 mg | ORAL_TABLET | ORAL | Status: DC
  Administered 2020-09-02 – 2020-09-07 (×3): 5 mg via ORAL
  Filled 2020-09-01 (×3): qty 1

## 2020-09-01 MED ORDER — TRAZODONE HCL 50 MG PO TABS: 25.0000 mg | ORAL_TABLET | Freq: Every evening | ORAL | Status: DC | PRN

## 2020-09-01 MED ORDER — MIDODRINE HCL 5 MG PO TABS: 5.0000 mg | ORAL_TABLET | ORAL | Status: DC

## 2020-09-01 NOTE — NC FL2 (Signed)
[facility] LEVEL OF CARE SCREENING TOOL     IDENTIFICATION  Patient Name: [name] Birthdate: 1950/11/06 Sex: female Admission Date (Current Location): 08/27/2020  [facility] and [place] Number:  Herbalist and Address:  The . [facility], [place] [address], [place], [place] [address]      Provider Number: [id number]  Attending Physician Name and Address:  Aline August, MD  Relative Name and Phone Number:       Current Level of Care: Hospital Recommended Level of Care: [place] Prior Approval Number:    Date Approved/Denied:   PASRR Number: [id number]  Discharge Plan: SNF    Current Diagnoses: Patient Active Problem List   Diagnosis Date Noted  . Malignant neoplasm of ascending colon ([place])   . Benign neoplasm of colon   . Cellulitis in diabetic foot ([place]) 08/28/2020  . Chronic ulcer of toe of right foot ([place])   . Colonic mass   . ESRD on hemodialysis ([place])   . Bowel obstruction ([place]) 08/27/2020  . Dysuria 05/16/2020  . Allergy, unspecified, initial encounter 03/30/2020  . Anaphylactic shock, unspecified, initial encounter 03/30/2020  . Unspecified protein-calorie malnutrition ([place]) 07/02/2019  . Underimmunization status 06/08/2019  . Body mass index (BMI) 40.0-44.9, adult ([place]) 05/26/2019  . Coagulation defect, unspecified ([place]) 05/26/2019  . Dependence on renal dialysis ([place]) 05/26/2019  . Diarrhea, unspecified 05/26/2019  . End stage renal disease ([place]) 05/26/2019  . Heart failure, unspecified ([place]) 05/26/2019  . Hypertensive chronic kidney disease with stage 1 through stage 4 chronic kidney disease, or unspecified chronic kidney disease 05/26/2019  . Iron deficiency anemia, unspecified 05/26/2019  . Malignant neoplasm of unspecified kidney, except renal pelvis ([place]) 05/26/2019  . Pain, unspecified 05/26/2019  . Peripheral vascular disease ([place]) 05/26/2019  . Polyneuropathy, unspecified 05/26/2019  .  Pruritus, unspecified 05/26/2019  . Secondary hyperparathyroidism of renal origin ([place]) 05/26/2019  . Shortness of breath 05/26/2019  . Unspecified asthma, uncomplicated 47/42/5956  . Disorder of kidney and ureter, unspecified 05/26/2019  . Anemia in chronic kidney disease 09/29/2018  . Nonsustained ventricular tachycardia ([place]) 08/28/2018  . Nonproliferative retinopathy due to secondary diabetes ([place])   . Left ventricular dysfunction 04/07/2018  . Essential hypertension 02/17/2018  . Coronary artery calcification seen on CAT scan 02/17/2018  . Diabetic retinopathy of both eyes ([place])   . Hyperlipidemia 10/01/2017  . Encounter for long-term (current) use of medications 10/01/2017  . Right renal mass 03/10/2017  . Diabetic ulcer of right foot associated with type 2 diabetes mellitus ([place])   . Type 2 diabetes, controlled, with neuropathy ([place]) 06/22/2013  . Ulcer of other part of foot 06/22/2013    Orientation RESPIRATION BLADDER Height & Weight     Self,Time,Situation,Place  O2 Incontinent Weight: 88.9 kg Height:  5\' 7"  (170.2 cm)  BEHAVIORAL SYMPTOMS/MOOD NEUROLOGICAL BOWEL NUTRITION STATUS      Ileostomy Diet  AMBULATORY STATUS COMMUNICATION OF NEEDS Skin   Limited Assist Verbally Surgical wounds                       Personal Care Assistance Level of Assistance  Feeding,Dressing,Bathing Bathing Assistance: Limited assistance Feeding assistance: Independent Dressing Assistance: Limited assistance     Functional Limitations Info  Sight,Hearing,Speech Sight Info: Adequate Hearing Info: Adequate Speech Info: Adequate    SPECIAL CARE FACTORS FREQUENCY  PT (By licensed PT),OT (By licensed OT)     PT Frequency: PT at SNF to eval and treat  a min of 5 days/week OT Frequency: OT at SNF to eval and treat a min of 5 days/week            Contractures Contractures Info: Not present    Additional Factors Info  Code Status,Allergies Code Status Info: DNR Allergies  Info: latex, penicillin, adhesive tape           Current Medications (09/01/2020):  This is the current hospital active medication list Current Facility-Administered Medications  Medication Dose Route Frequency Provider Last Rate Last Admin  . 0.9 %  sodium chloride infusion   Intravenous Continuous Brennan Bailey, MD 10 mL/hr at 08/30/20 1800 10 mL/hr at 08/30/20 1800  . 0.9 %  sodium chloride infusion  10 mL/hr Intravenous Once Thelma Comp, CRNA      . acetaminophen (TYLENOL) tablet 1,000 mg  1,000 mg Per Tube Q6H Jesusita Oka, MD   1,000 mg at 09/01/20 1218  . albuterol (VENTOLIN HFA) 108 (90 Base) MCG/ACT inhaler 2 puff  2 puff Inhalation Q4H PRN Mansy, Jan A, MD      . ceFEPIme (MAXIPIME) 2 g in sodium chloride 0.9 % 100 mL IVPB  2 g Intravenous Q T,Th,Sa-HD Hammons, Kimberly B, RPH 200 mL/hr at 08/31/20 2117 2 g at 08/31/20 2117  . Chlorhexidine Gluconate Cloth 2 % PADS 6 each  6 each Topical Q0600 Dwana Melena, MD   6 each at 09/01/20 0600  . Chlorhexidine Gluconate Cloth 2 % PADS 6 each  6 each Topical Q0600 Janalee Dane, PA-C   6 each at 09/01/20 1219  . dextrose 5 % solution   Intravenous Continuous Shela Leff, MD 50 mL/hr at 08/30/20 1130 Restarted at 08/30/20 1130  . diphenhydrAMINE (BENADRYL) 12.5 MG/5ML elixir 25 mg  25 mg Per Tube Daily PRN Hammons, Theone Murdoch, RPH      . doxercalciferol (HECTOROL) injection 3 mcg  3 mcg Intravenous Q T,Th,Sa-HD Ernest Haber, PA-C   3 mcg at 08/31/20 1420  . fluticasone furoate-vilanterol (BREO ELLIPTA) 100-25 MCG/INH 1 puff  1 puff Inhalation Daily Mansy, Jan A, MD   1 puff at 09/01/20 0735  . gabapentin (NEURONTIN) 250 MG/5ML solution 300 mg  300 mg Per Tube TID Hammons, Kimberly B, RPH   300 mg at 09/01/20 1122  . heparin injection 5,000 Units  5,000 Units Subcutaneous Q8H Mansy, Arvella Merles, MD   5,000 Units at 09/01/20 0543  . HYDROmorphone (DILAUDID) injection 0.5-1 mg  0.5-1 mg Intravenous Q2H PRN Jesusita Oka, MD       . insulin aspart (novoLOG) injection 0-15 Units  0-15 Units Subcutaneous Q4H Eugenie Filler, MD   5 Units at 09/01/20 1243  . iohexol (OMNIPAQUE) 300 MG/ML solution 100 mL  100 mL Intravenous Once PRN Truddie Hidden, MD      . MEDLINE mouth rinse  15 mL Mouth Rinse BID Alma Friendly, MD   15 mL at 09/01/20 1000  . methocarbamol (ROBAXIN) tablet 1,000 mg  1,000 mg Per Tube Q8H Jesusita Oka, MD   1,000 mg at 09/01/20 0519  . [START ON 09/02/2020] midodrine (PROAMATINE) tablet 5 mg  5 mg Per Tube Q T,Th,Sa-HD Hammons, Kimberly B, RPH      . multivitamin (RENA-VIT) tablet 1 tablet  1 tablet Per Tube QHS Hammons, Kimberly B, RPH      . mupirocin ointment (BACTROBAN) 2 % 1 application  1 application Nasal BID Jesusita Oka, MD   1 application at  09/01/20 1100  . ondansetron (ZOFRAN) tablet 4 mg  4 mg Per Tube Q6H PRN Hammons, Kimberly B, RPH       Or  . ondansetron (ZOFRAN) injection 4 mg  4 mg Intravenous Q6H PRN Hammons, Kimberly B, RPH      . oxyCODONE (ROXICODONE) 5 MG/5ML solution 5-10 mg  5-10 mg Per Tube Q4H PRN Jesusita Oka, MD      . pantoprazole (PROTONIX) injection 40 mg  40 mg Intravenous Q24H Carl Best M, NP   40 mg at 08/31/20 1542  . polyvinyl alcohol (LIQUIFILM TEARS) 1.4 % ophthalmic solution 1 drop  1 drop Both Eyes Daily PRN Mansy, Jan A, MD      . traZODone (DESYREL) tablet 25 mg  25 mg Per Tube QHS PRN Hammons, Theone Murdoch, RPH      . vancomycin (VANCOCIN) IVPB 1000 mg/200 mL premix  1,000 mg Intravenous Q T,Th,Sa-HD Hammons, Kimberly B, RPH 200 mL/hr at 08/31/20 1423 1,000 mg at 08/31/20 1423     Discharge Medications: Please see discharge summary for a list of discharge medications.  Relevant Imaging Results:  Relevant Lab Results:   Additional Information ss # [id number]; HD TTS [place] 7am (has to be there 6:45)  Bartholomew Crews, RN

## 2020-09-01 NOTE — Progress Notes (Addendum)
2 Days Post-Op    CC: Abdominal pain and emesis.  Subjective: She is laying in bed her biggest complaint is her sore throat.  She does not want to try any pills she does not think she can swallow them.  She had minimal output through her NG.  She does have some gas and a little bit of formed stool in her colostomy this AM.  Midline incision looks fine.  It does not look like she has been up ambulating at all.  Objective: Vital signs in last 24 hours: Temp:  [97.6 F (36.4 C)-99 F (37.2 C)] 98.5 F (36.9 C) (02/11 0412) Pulse Rate:  [83-101] 85 (02/11 0412) Resp:  [14-18] 16 (02/10 1512) BP: (84-120)/(23-61) 120/61 (02/11 0412) SpO2:  [97 %-100 %] 99 % (02/11 0412) FiO2 (%):  [28 %] 28 % (02/10 0828) Weight:  [86.7 kg-88.9 kg] 88.9 kg (02/10 2041) Last BM Date: 08/26/20 NPO 622 IV recorded No urine  NG 100 Stool 50 Afebrile, VSS Creatinine 5.52 H/H down 8.1/25  Intake/Output from previous day: 02/10 0701 - 02/11 0700 In: 622.7 [I.V.:132.7; IV Piggyback:490] Out: -350 [Emesis/NG output:100; Stool:50] Intake/Output this shift: No intake/output data recorded.  General appearance: alert, cooperative, no distress and Uncomfortable with NG tube Resp: clear to auscultation bilaterally GI: Midline incision looks fine.  There is some gas and a small amount of formed stool in the ostomy bag this AM.  Lab Results:  Recent Labs    08/31/20 0159 09/01/20 0400  WBC 11.5* 6.9  HGB 10.0* 8.1*  HCT 31.3* 25.0*  PLT 139* 90*    BMET Recent Labs    08/31/20 0159 09/01/20 0400  NA 141 141  K 5.2* 4.2  CL 104 101  CO2 15* 24  GLUCOSE 224* 127*  BUN 56* 31*  CREATININE 8.03* 5.52*  CALCIUM 7.5* 8.1*   PT/INR No results for input(s): LABPROT, INR in the last 72 hours.  Recent Labs  Lab 08/27/20 1350 08/28/20 0424 08/29/20 0303 08/30/20 0252  AST 20 17  --   --   ALT 12 11  --   --   ALKPHOS 72 59  --   --   BILITOT 1.1 0.9  --   --   PROT 7.4 6.3*  --   --    ALBUMIN 3.5 2.9* 2.5* 2.3*     Lipase     Component Value Date/Time   LIPASE 33 08/27/2020 1350     Medications: . acetaminophen  1,000 mg Per Tube Q6H  . Chlorhexidine Gluconate Cloth  6 each Topical Q0600  . doxercalciferol  3 mcg Intravenous Q T,Th,Sa-HD  . fluticasone furoate-vilanterol  1 puff Inhalation Daily  . gabapentin  300 mg Oral TID  . heparin injection (subcutaneous)  5,000 Units Subcutaneous Q8H  . insulin aspart  0-15 Units Subcutaneous Q4H  . mouth rinse  15 mL Mouth Rinse BID  . methocarbamol  1,000 mg Per Tube Q8H  . [START ON 09/02/2020] midodrine  5 mg Oral Q T,Th,Sa-HD  . multivitamin  1 tablet Oral QHS  . mupirocin ointment  1 application Nasal BID  . pantoprazole (PROTONIX) IV  40 mg Intravenous Q24H    Assessment/Plan ESRD on HD TTS - hyperkalemia on todays labs (K 5.2)  Hx renal cell carcinoma; s/p partial right nephrectomy adrenalectomy 2018 Type 2 diabetes; diabetic neuropathy, diabetic retinopathy. 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 Osteo of b/l third LE digits - podiatry following GERD Hyperlipidemia Hx asthma Protein Calorie  Malnutrition - Pre-alb 11.4. Nutrition consult when tolerating diet. If develops prolonged ileus, may need TPN. Anemia   - H/H 9.4/29.6>>8.2/24>>10/31.3>>8.1/25   Adenocarcinoma of the colon w/ nearly obstructing mass S/p exploratory laparotomy, right hemicolectomy, ileostomy creation - Dr. Bobbye Morton - 08/30/2020 POD #2 - Path from colonoscopy reveals colon mass bx w/ poorly differentiated adenocarcinoma with signet ring features. Polypectomy w/ tubular adenoma w/ no high grade dysplasia or carcinoma.  - CEA 3.7 - Surgical path pending.  - CT of the chest without contrast showed lingular nodule, slowly enlarging over the last 2 years.  - CT of the A/P w/ few mildly enlarged mesenteric lymph nodes in the right lower quadrant and right mid abdomen, concerning for nodal metastatic disease. - It appears that  oncology and palliative care have already been consulted for new diagnosis of colonic adenocarcinoma  - Await return of bowel function. Keep NPO and NGT to LIWS. If does not have ROBF in a few days, will order TPN - Mobilize - PT consult - Pulm toilet - WOCN following for new ostomy  FEN -NPO, NGT, IVF per [facility] >> clamping NG and checking later hope to remove, clear liquids VTE -SCDs, Heparin subq ID -Vanc and Cefepime for osteo (per podiatry). Received clinda and gent periop.  Foley - None. Bladder scan (reports she voids 1-2x/day. Has not voided since surgery) Follow-Up - Dr. Bobbye Morton   Plan: Clamp NG and if she does well pull the NG later today.  Start her on clear liquids.  Mobilize, OT and PT.  I did not see an IS in the room so I have also added IS.  I also talked with the son today.      LOS: 5 days    Wanza Szumski 09/01/2020 Please see Amion

## 2020-09-01 NOTE — TOC Initial Note (Addendum)
Transition of Care [facility]) - Initial/Assessment Note    Patient Details  Name: [name] MRN: [id number] Date of Birth: [birth year]  Transition of Care [facility] CM/SW Contact:    Bartholomew Crews, RN Phone Number: [phone] 09/01/2020, 2:23 PM  Clinical Narrative:                  Spoke with patient at the bedside to discuss transition plans. PTA home alone. Has rollator. Stated that her sister, Jaleea Alesi, lives close to her at [place] [place], [place]. but not sure they have all the parts. Verbal consent received to speak with her sister, Caren Griffins, to discuss transition plans.  Patient attends HD at [place] (supposed to be there 6:45). Stated that her sister drives her to dialysis. She stated that her sister has a hospital bed and a rollator. She has a 3-N-1 from a family member. She was planning on staying with her sister at discharge, but stated that she had been to SNF [facility]) about 3 years ago and would be willing to consider STR this time.   Discussed outpatient referral for palliative with [facility]. Patient stated, "I am determined." She stated that she has a great grandbaby and she wants to hear him say say "[place]." Educated about the difference between hospice and palliative - patient agreeable to palliative. Outpatient palliative referral sent to [facility].   Spoke with Juliann Pulse at [facility] about possible bed offer. Advised that patient may be medically ready next week. Family can provide transportation for dialysis. Juliann Pulse requested follow up call for bed offer on Monday.  TOC following for transition needs.      Patient Goals and CMS Choice        Expected Discharge Plan and Services                                                Prior Living Arrangements/Services                       Activities of Daily Living Home Assistive Devices/Equipment: CBG Meter,Cane (specify quad or straight),Shower chair without back,Walker  (specify type) (single point cane, front wheeled walker) ADL Screening (condition at time of admission) Patient's cognitive ability adequate to safely complete daily activities?: Yes Is the patient deaf or have difficulty hearing?: No Does the patient have difficulty seeing, even when wearing glasses/contacts?: No Does the patient have difficulty concentrating, remembering, or making decisions?: No Patient able to express need for assistance with ADLs?: Yes Does the patient have difficulty dressing or bathing?: No Independently performs ADLs?: No Communication: Independent Dressing (OT): Independent Grooming: Independent Feeding: Independent Bathing: Independent Toileting: Independent with device (comment) In/Out Bed: Independent Walks in Home: Independent with device (comment) Does the patient have difficulty walking or climbing stairs?: Yes (secondary to weakness) Weakness of Legs: Both Weakness of Arms/Hands: None  Permission Sought/Granted                  Emotional Assessment              Admission diagnosis:  Bowel obstruction ([place]) [K56.609] Large bowel obstruction ([place]) [K56.609] ESRD on hemodialysis ([place]) [N18.6, Z99.2] Colonic mass [K63.89] Chronic ulcer of toe of right foot, unspecified ulcer stage ([place]) [L97.519] Patient Active Problem List   Diagnosis Date Noted  .  Malignant neoplasm of ascending colon ([place])   . Benign neoplasm of colon   . Cellulitis in diabetic foot ([place]) 08/28/2020  . Chronic ulcer of toe of right foot ([place])   . Colonic mass   . ESRD on hemodialysis ([place])   . Bowel obstruction ([place]) 08/27/2020  . Dysuria 05/16/2020  . Allergy, unspecified, initial encounter 03/30/2020  . Anaphylactic shock, unspecified, initial encounter 03/30/2020  . Unspecified protein-calorie malnutrition ([place]) 07/02/2019  . Underimmunization status 06/08/2019  . Body mass index (BMI) 40.0-44.9, adult ([place]) 05/26/2019  . Coagulation defect, unspecified ([place])  05/26/2019  . Dependence on renal dialysis ([place]) 05/26/2019  . Diarrhea, unspecified 05/26/2019  . End stage renal disease ([place]) 05/26/2019  . Heart failure, unspecified ([place]) 05/26/2019  . Hypertensive chronic kidney disease with stage 1 through stage 4 chronic kidney disease, or unspecified chronic kidney disease 05/26/2019  . Iron deficiency anemia, unspecified 05/26/2019  . Malignant neoplasm of unspecified kidney, except renal pelvis ([place]) 05/26/2019  . Pain, unspecified 05/26/2019  . Peripheral vascular disease ([place]) 05/26/2019  . Polyneuropathy, unspecified 05/26/2019  . Pruritus, unspecified 05/26/2019  . Secondary hyperparathyroidism of renal origin ([place]) 05/26/2019  . Shortness of breath 05/26/2019  . Unspecified asthma, uncomplicated 33/35/4562  . Disorder of kidney and ureter, unspecified 05/26/2019  . Anemia in chronic kidney disease 09/29/2018  . Nonsustained ventricular tachycardia ([place]) 08/28/2018  . Nonproliferative retinopathy due to secondary diabetes ([place])   . Left ventricular dysfunction 04/07/2018  . Essential hypertension 02/17/2018  . Coronary artery calcification seen on CAT scan 02/17/2018  . Diabetic retinopathy of both eyes ([place])   . Hyperlipidemia 10/01/2017  . Encounter for long-term (current) use of medications 10/01/2017  . Right renal mass 03/10/2017  . Diabetic ulcer of right foot associated with type 2 diabetes mellitus ([place])   . Type 2 diabetes, controlled, with neuropathy ([place]) 06/22/2013  . Ulcer of other part of foot 06/22/2013   PCP:  [name], MD Pharmacy:   [facility], [address] [place] [address] Phone: [phone] Fax: [phone]  [facility], [address], [address] [place], [place] [address] Phone: [phone] Fax: [phone]     Social Determinants of Health (SDOH)  Interventions    Readmission Risk Interventions No flowsheet data found.

## 2020-09-01 NOTE — Progress Notes (Signed)
[facility] Progress Note   Subjective:   Reports her throat feels dry this AM. Requesting sweetener for her coffee. Otherwise, no new concerns. Denies SOB, CP, abdominal pain and nausea. Coping with her new diagnosis.   Objective Vitals:   08/31/20 2041 09/01/20 0412 09/01/20 0735 09/01/20 0842  BP: (!) 107/44 120/61  (!) 94/56  Pulse: 95 85  83  Resp:    18  Temp: 98.3 F (36.8 C) 98.5 F (36.9 C)  98.6 F (37 C)  TempSrc:    Oral  SpO2: 97% 99% 100% 100%  Weight: 88.9 kg     Height:       Physical Exam General: Chronically ill appearing female, alert, in NAD. NG tube present Heart: RRR, no murmurs, rubs or gallops Lungs: CTA bilaterally, respirations unlabored on O2 via [place] Abdomen: Soft, non-distended, +BS, midline incision with dressing, + ostomy Extremities: No edema b/l lower extremities Dialysis Access: LUE AVF + bruit  Additional Objective Labs: Basic Metabolic Panel: Recent Labs  Lab 08/29/20 0303 08/30/20 0252 08/30/20 1601 08/31/20 0159 09/01/20 0400  NA 139 142 139 141 141  K 4.3 4.0 4.1 5.2* 4.2  CL 99 103 103 104 101  CO2 22 21*  --  15* 24  GLUCOSE 72 84 114* 224* 127*  BUN 41* 47* 50* 56* 31*  CREATININE 5.74* 7.14* 7.00* 8.03* 5.52*  CALCIUM 7.4* 7.7*  --  7.5* 8.1*  PHOS 6.3* 8.0*  --   --   --    Liver Function Tests: Recent Labs  Lab 08/27/20 1350 08/28/20 0424 08/29/20 0303 08/30/20 0252  AST 20 17  --   --   ALT 12 11  --   --   ALKPHOS 72 59  --   --   BILITOT 1.1 0.9  --   --   PROT 7.4 6.3*  --   --   ALBUMIN 3.5 2.9* 2.5* 2.3*   Recent Labs  Lab 08/27/20 1350  LIPASE 33   CBC: Recent Labs  Lab 08/28/20 0424 08/29/20 0302 08/30/20 0252 08/30/20 1601 08/31/20 0159 09/01/20 0400  WBC 9.4 7.4 6.4  --  11.5* 6.9  NEUTROABS  --   --  4.8  --  10.4* 5.5  HGB 11.3* 10.6* 9.4* 8.2* 10.0* 8.1*  HCT 34.5* 33.0* 29.6* 24.0* 31.3* 25.0*  MCV 95.6 96.2 97.7  --  96.3 97.7  PLT 160 146* 128*  --  139* 90*    Blood Culture    Component Value Date/Time   SDES  06/28/2020 1615    WOUND LT 3RD TOE Performed at [facility], [address] [place], [place] [address]    [place]  06/28/2020 1615    NONE Performed at [facility], [place] [address], [place], [place] [address]    CULT MODERATE STAPHYLOCOCCUS AUREUS 06/28/2020 1615   REPTSTATUS 07/01/2020 FINAL 06/28/2020 1615   CBG: Recent Labs  Lab 08/31/20 1511 08/31/20 1615 08/31/20 2202 09/01/20 0319 09/01/20 0740  GLUCAP 129* 141* 165* 123* 109*   Medications: . sodium chloride 10 mL/hr (08/30/20 1800)  . sodium chloride    . ceFEPime (MAXIPIME) IV 2 g (08/31/20 2117)  . dextrose 50 mL/hr at 08/30/20 1130  . vancomycin 1,000 mg (08/31/20 1423)   . acetaminophen  1,000 mg Per Tube Q6H  . Chlorhexidine Gluconate Cloth  6 each Topical Q0600  . doxercalciferol  3 mcg Intravenous Q T,Th,Sa-HD  . fluticasone furoate-vilanterol  1 puff Inhalation  Daily  . gabapentin  300 mg Oral TID  . heparin injection (subcutaneous)  5,000 Units Subcutaneous Q8H  . insulin aspart  0-15 Units Subcutaneous Q4H  . mouth rinse  15 mL Mouth Rinse BID  . methocarbamol  1,000 mg Per Tube Q8H  . [START ON 09/02/2020] midodrine  5 mg Oral Q T,Th,Sa-HD  . multivitamin  1 tablet Oral QHS  . mupirocin ointment  1 application Nasal BID  . pantoprazole (PROTONIX) IV  40 mg Intravenous Q24H    Outpatient Dialysis Orders: [facility] 3h 31min 400/500 87kg 2/2.25 bath P2 LUA AVF Hep 5000+ 1000 midrun - mircera 100ug last 1/11 - hect 3 ug  Assessment/Plan: 1. ESRD -Normally dialyzes TTS, wasoff schedulesecondary to procedures and numerous emergent patients. K+ 4.2, continue TTS schedule. Holding heparin with HD post-op 2. 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 Hypertension/volume=uses midodrine for BP support with dialysis, appears euvolemic, starting to eat clear liquids 4. Renal osteodystrophy -Corrected calcium ok. Phos has been elevated, takes Tums as her binder (has refused other binders). Continue hectorol with HD. 5. Anemia - Hgb variable, 8-10 range. Stopping mircera with new dx of malignancy. No IV iron with osteomyelitis. Transfuse PRN 6. DM type 2- per  admit 7. PAD -Podiatry tentatively planning for surgery Monday, 2/14 evening if patient is stable. 8. Asthma without exasperation on meds per admit   Anice Paganini, PA-C 09/01/2020, 10:21 AM  [facility] Pager: [phone]

## 2020-09-01 NOTE — Progress Notes (Signed)
   PODIATRY PROGRESS NOTE  [name] MRN [id number] DOB01/23/1952 [place] 08/27/2020  Podiatry Chief complaint: Ulcers/osteomyelitis bilateral third digits.   - She is status post hemicolectomy with ileostomy 08/30/2020 secondary to obstructing colon mass, likely malignant based on operative note.   - spoke with hospitalist 08/31/2020, Dr. Starla Link, for coordination of care regarding the patient's chronic osteomyelitis to the third digit bilateral.  According to Dr. Starla Link patient is likely to stay inpatient for several more days for postoperative monitoring.   - Will tentatively plan for surgery to address the chronic osteomyelitis of the third toes bilateral on Monday, 09/04/2020 after 5pm, if patient is stable and able to return to the OR.  In the meantime, continue wound care dressing change orders daily.   - Will plan to round on patient morning of 09/04/2020 to initiate preoperative orders and discuss surgery to address the OM to the bilateral toes.   Please contact me directly with any questions or concerns. Cell [phone].    Edrick Kins, DPM [facility]  Dr. Edrick Kins, DPM    [address], [place] [address]                Office [phone]  Fax [phone]

## 2020-09-01 NOTE — Consult Note (Signed)
Palliative Medicine Inpatient Consult Note  Reason for consult:  Goals of Care "Goals of care. New diagnosis of colon cancer"  HPI:  Per intake H&P --> [name]  is a 70 y.o. Caucasian female with a known history of asthma, anemia, renal cell carcinoma status post radical nephrectomy, type 2 diabetes mellitus, peripheral vascular disease, end-stage renal disease on hemodialysis, hypertension, dyslipidemia, GERD and diabetic retinopathy as well as neuropathy, who presented to the emergency room with acute onset of abdominal pain with intractable and nausea and vomiting to liquids and solids as well as associated constipation over a week ago and has been taking over-the-counter stool softener with good results.Upon workup she has been identified to have adenocarcinoma of the colon. Oncology is involved and awaiting the results of pathology results for staging. Palliative care has been asked to discuss goals of care with [name] in light of this new diagnosis.   Clinical Assessment/Goals of Care:  *Please note that this is a verbal dictation therefore any spelling or grammatical errors are due to the "[employer]" system interpretation.  I have reviewed medical records including EPIC notes, labs and imaging, received report from bedside RN, assessed the patient who is lying in bed. She shares that she has had a tough night.    I met with [name] to further discuss diagnosis prognosis, GOC, EOL wishes, disposition and options.   I introduced Palliative Medicine as specialized medical care for people living with serious illness. It focuses on providing relief from the symptoms and stress of a serious illness. The goal is to improve quality of life for both the patient and the family.  [name] shares with me that she is from [place], [name]. She is not married. She has two sons, [name] and [name] who live locally. She is a Air traffic controller for trade. She shares that she has two poodles,  [place] who mean the world to her. She does believe in God but does not ascribe to any specific faith.  Prior to hospitalization Bera shares that she was living alone in a single family home. She was able to complete all bADL's and iADL's. She uses a cane and sometimes a walker to aid in mobility. She is no longer driving.   I asked Aunna what she understands about her present diagnosis. We discussed that she has colon cancer. She expresses understanding and that possible treatment options have not yet been offered. She expresses that "I don't know what happened, I was doing fine." We discussed how difficult it is to hear that you have cancer. I allowed time for her to express her feeling regarding this diagnosis. She shares with me that her mother, brother, and aunt have all died within the last three years. She was tearful during this time, expressing how hard it has been.   A detailed discussion was had today regarding advanced directives, patient has never completed these though she shares with me that she would wish for her son, [name] to make decisions if needed.    Concepts specific to code status, artifical feeding and hydration, continued IV antibiotics and rehospitalization was had.  I introduced a MOST form and we completed it per Mieko's wishes as below:   Cardiopulmonary Resuscitation: Do Not Attempt Resuscitation (DNR/No CPR)  Medical Interventions: Limited Additional Interventions: Use medical treatment, IV fluids and cardiac monitoring as indicated, DO NOT USE intubation or mechanical ventilation. May consider use of less invasive airway support such as BiPAP or CPAP.  Also provide comfort measures. Transfer to the hospital if indicated. Avoid intensive care.   Antibiotics: Antibiotics if indicated  IV Fluids: IV fluids if indicated  Feeding Tube: No feeding tube   The difference between a aggressive medical intervention path  and a palliative comfort care path for this patient  at this time was had. Values and goals of care important to patient and family were attempted to be elicited. We discussed getting the pathology results on staging and treatment from oncology and going from there. I shared with her that at this juncture it may be useful to consider quality over quantity of life.  I did described hospice to her.  We reviewed hospice as a service for patients for have a life expectancy of < 6 months. It preserves dignity and quality at the end phases of life. The focus changes from curative to symptom relief.   I offered OP Palliative support at this time to continue these important conversations which she is agreeable to.   Discussed the importance of continued conversation with family and their  medical providers regarding overall plan of care and treatment options, ensuring decisions are within the context of the patients values and GOCs. _______________________________________________ Addendum:  I called patients son, [name] to discuss the above. He was in agreement with the plan. We reviewed her chosen code status which he was aware of. We discussed the plan for OP Palliative support.  Decision Maker: [name] is able to make decisions for herself though she would rely on her son, [name] if unable to make decisions.  SUMMARY OF RECOMMENDATIONS   DNAR/DNI  MOST Completed, paper copy placed onto the chart electric copy can be found in [facility]  DNR Form Completed, paper copy placed onto the chart electric copy can be found in [facility]  Generalized Weakness - PT/OT and SNF placement  TOC - OP Palliative Care  Ongoing goals of care conversations upon discharge  by OP team  Code Status/Advance Care Planning: DNAR/DNI   Palliative Prophylaxis:   Oral care, mobility  Additional Recommendations (Limitations, Scope, Preferences):  Continue current scope of treatment  Psycho-social/Spiritual:   Desire for further Chaplaincy support: Yes  Additional  Recommendations: Education on quality of life   Prognosis: Poor in the setting of multiple comorbidities,, new diagnosis of adenocarcinoma   Discharge Planning: Discharge to SNF with OP Palliative support  Vitals:   08/31/20 2041 09/01/20 0412  BP: (!) 107/44 120/61  Pulse: 95 85  Resp:    Temp: 98.3 F (36.8 C) 98.5 F (36.9 C)  SpO2: 97% 99%    Intake/Output Summary (Last 24 hours) at 09/01/2020 3151 Last data filed at 09/01/2020 0414 Gross per 24 hour  Intake 622.66 ml  Output -350 ml  Net 972.66 ml   Last Weight  Most recent update: 09/01/2020  6:01 AM   Weight  88.9 kg (195 lb 15.8 oz)           Gen:  Obese elderly Caucasian F in NAD HEENT: Dry mucous membranes, NGT in place CV: Regular rate and rhythm  PULM: clear to auscultation bilaterally  ABD: soft/nontender  EXT: (+) generalized edema Neuro: Alert and oriented x4  PPS: 40%   This conversation/these recommendations were discussed with patient primary care team, Dr. Starla Link  Time In: 0640 Time Out: 0800 Total Time: 80 minutes Greater than 50%  of this time was spent counseling and coordinating care related to the above assessment and plan.  [place]  Medicine Team Team Cell Phone: [phone] Please utilize secure chat with additional questions, if there is no response within 30 minutes please call the above phone number  Palliative Medicine Team providers are available by phone from 7am to 7pm daily and can be reached through the team cell phone.  Should this patient require assistance outside of these hours, please call the patient's attending physician.

## 2020-09-01 NOTE — Progress Notes (Addendum)
Patient ID: [name], female   DOB: [birth year], 70 y.o.   MRN: [id number]  PROGRESS NOTE    MARYNA YEAGLE  JSE:[id number] DOB: 27-Oct-1950 DOA: 08/27/2020 PCP: [name], MD   Brief Narrative:  70 year old female history of asthma, anemia, renal cell carcinoma status post nephrectomy, type 2 diabetes mellitus, end-stage renal disease on hemodialysis Tuesday Thursday Saturday, peripheral vascular disease, hypertension, hyperlipidemia, GERD, diabetic retinopathy as well as neuropathy presented to the ED with acute abdominal pain, intractable nausea and vomiting to liquids and solids. Patient noted to have missed hemodialysis on Tuesday and Saturday prior to admission.  Patient also noted to have been scheduled for amputation of the right third toe per podiatry.  In the ED: work-up with CT abdomen and pelvis revealed small and large bowel obstruction secondary to previously demonstrated colonic mass in the ascending colon. NG tube placed.  Nephrology consulted.  GI consulted as well as general surgery.  She underwent colonoscopy on 08/29/2020 which showed likely malignant tumor which was biopsied.  She underwent surgical intervention on 08/30/2020  Assessment & Plan:   Small and large bowel obstruction secondary right ascending colon mass New diagnosis of colonic adenocarcinoma -Status post colonoscopy on 08/29/2020 by GI which showed likely malignant tumor, biopsied, removed for 3 to 8 mm polyps, pathology shows colonic adenocarcinoma. -Status post exploratory laparotomy, right hemicolectomy, ileostomy creation on 08/30/2020.  Wound care/diet as per general surgery recommendations.  Continue NG tube. -CT of the chest without contrast showed lingular nodule, slowly enlarging and recommended surveillance -Currently on gentle hydration.   -Oncology evaluation appreciated: Awaiting final pathology from surgery.   Palliative care evaluation is pending   End-stage renal disease on  hemodialysis -Dialysis as per nephrology schedule.  Nephrology following  Diabetes mellitus type 2 with hyperglycemia and diabetic neuropathy and retinopathy -A1c 7.8.  Continue CBGs with SSI  Bilateral third toes cellulitis/ulcer with chronic osteomyelitis -Followed by podiatry Dr Amalia Hailey with plans for possible amputation during this admission Continue empiric IV vancomycin and cefepime -Spoke to Dr. Amalia Hailey on phone on 08/31/2020 and he is planning for surgical intervention tentatively on Monday if patient remains stable.  Anemia of chronic disease -Hemoglobin stable  Thrombocytopenia -Questionable cause.  No signs of bleeding  Hyperlipidemia -Resume statin when no longer n.p.o  Asthma without exacerbation -Continue home regimen Breo Ellipta and albuterol inhaler.   DVT prophylaxis: Heparin Code Status: Full Family Communication: Spoke to son/Kevin on phone on 09/01/20 Disposition Plan: Status is: Inpatient  Remains inpatient appropriate because:Hemodynamically unstable and Inpatient level of care appropriate due to severity of illness   Dispo: The patient is from: Home              Anticipated d/c is to: Home              Anticipated d/c date is: > 3 days              Patient currently is not medically stable to d/c.   Difficult to place patient No   Consultants: GI/general surgery/nephrology  Procedures: Colonoscopy on 08/29/2020 Right hemicolectomy on 06/21/2021  Antimicrobials: Vancomycin and cefepime from 08/27/2020 onwards   Subjective: Patient seen and examined at bedside.  Poor historian.  Complains of intermittent abdominal pain.  No overnight fever, vomiting, worsening shortness of breath reported. Objective: Vitals:   08/31/20 1450 08/31/20 1512 08/31/20 2041 09/01/20 0412  BP: (!) 105/43 (!) 110/53 (!) 107/44 120/61  Pulse: 98 83 95 85  Resp: 18 16  Temp: 98.5 F (36.9 C) 97.8 F (36.6 C) 98.3 F (36.8 C) 98.5 F (36.9 C)  TempSrc: Axillary      SpO2: 100% 100% 97% 99%  Weight: 86.7 kg  88.9 kg   Height:        Intake/Output Summary (Last 24 hours) at 09/01/2020 0729 Last data filed at 09/01/2020 0414 Gross per 24 hour  Intake 622.66 ml  Output -350 ml  Net 972.66 ml   Filed Weights   08/31/20 1140 08/31/20 1450 08/31/20 2041  Weight: 86.7 kg 86.7 kg 88.9 kg    Examination:  General exam: Chronically ill looking.  On 2 L oxygen via nasal cannula.  Poor historian. ENT: NG tube in place Respiratory system: Bilateral decreased breath sounds at bases with scattered crackles cardiovascular system: S1-S2 heard, rate controlled Gastrointestinal system: Abdomen is still distended, soft and dressing present with surrounding tenderness.  Bowel sounds are sluggish.  Ostomy bag present. extremities: No clubbing; bilateral lower extremity edema. Central nervous system: Sleepy, wakes up slightly; does not participate in conversation much.  No focal neurological deficits.  Moving extremities. Skin: No obvious petechiae/ecchymosis  psychiatry: Flat affect    Data Reviewed: I have personally reviewed following labs and imaging studies  CBC: Recent Labs  Lab 08/28/20 0424 08/29/20 0302 08/30/20 0252 08/30/20 1601 08/31/20 0159 09/01/20 0400  WBC 9.4 7.4 6.4  --  11.5* 6.9  NEUTROABS  --   --  4.8  --  10.4* 5.5  HGB 11.3* 10.6* 9.4* 8.2* 10.0* 8.1*  HCT 34.5* 33.0* 29.6* 24.0* 31.3* 25.0*  MCV 95.6 96.2 97.7  --  96.3 97.7  PLT 160 146* 128*  --  139* 90*   Basic Metabolic Panel: Recent Labs  Lab 08/28/20 0424 08/29/20 0302 08/29/20 0303 08/30/20 0252 08/30/20 1601 08/31/20 0159 09/01/20 0400  NA 137  --  139 142 139 141 141  K 5.9*  --  4.3 4.0 4.1 5.2* 4.2  CL 93*  --  99 103 103 104 101  CO2 24  --  22 21*  --  15* 24  GLUCOSE 143*  --  72 84 114* 224* 127*  BUN 76*  --  41* 47* 50* 56* 31*  CREATININE 8.71*  --  5.74* 7.14* 7.00* 8.03* 5.52*  CALCIUM 7.7*  --  7.4* 7.7*  --  7.5* 8.1*  MG  --  1.8  --    --   --   --  2.0  PHOS  --   --  6.3* 8.0*  --   --   --    GFR: Estimated Creatinine Clearance: 11 mL/min (A) (by C-G formula based on SCr of 5.52 mg/dL (H)). Liver Function Tests: Recent Labs  Lab 08/27/20 1350 08/28/20 0424 08/29/20 0303 08/30/20 0252  AST 20 17  --   --   ALT 12 11  --   --   ALKPHOS 72 59  --   --   BILITOT 1.1 0.9  --   --   PROT 7.4 6.3*  --   --   ALBUMIN 3.5 2.9* 2.5* 2.3*   Recent Labs  Lab 08/27/20 1350  LIPASE 33   No results for input(s): AMMONIA in the last 168 hours. Coagulation Profile: No results for input(s): INR, PROTIME in the last 168 hours. Cardiac Enzymes: No results for input(s): CKTOTAL, CKMB, CKMBINDEX, TROPONINI in the last 168 hours. BNP (last 3 results) No results for input(s): PROBNP in the last 8760 hours. HbA1C:  No results for input(s): HGBA1C in the last 72 hours. CBG: Recent Labs  Lab 08/31/20 0717 08/31/20 1511 08/31/20 1615 08/31/20 2202 09/01/20 0319  GLUCAP 175* 129* 141* 165* 123*   Lipid Profile: No results for input(s): CHOL, HDL, LDLCALC, TRIG, CHOLHDL, LDLDIRECT in the last 72 hours. Thyroid Function Tests: No results for input(s): TSH, T4TOTAL, FREET4, T3FREE, THYROIDAB in the last 72 hours. Anemia Panel: No results for input(s): VITAMINB12, FOLATE, FERRITIN, TIBC, IRON, RETICCTPCT in the last 72 hours. Sepsis Labs: No results for input(s): PROCALCITON, LATICACIDVEN in the last 168 hours.  Recent Results (from the past 240 hour(s))  SARS Coronavirus 2 by RT PCR (hospital order, performed in [facility] hospital lab) Nasopharyngeal Nasopharyngeal Swab     Status: None   Collection Time: 08/27/20  9:46 PM   Specimen: Nasopharyngeal Swab  Result Value Ref Range Status   SARS Coronavirus 2 NEGATIVE NEGATIVE Final    Comment: (NOTE) SARS-CoV-2 target nucleic acids are NOT DETECTED.  The SARS-CoV-2 RNA is generally detectable in upper and lower respiratory specimens during the acute phase of  infection. The lowest concentration of SARS-CoV-2 viral copies this assay can detect is 250 copies / mL. A negative result does not preclude SARS-CoV-2 infection and should not be used as the sole basis for treatment or other patient management decisions.  A negative result may occur with improper specimen collection / handling, submission of specimen other than nasopharyngeal swab, presence of viral mutation(s) within the areas targeted by this assay, and inadequate number of viral copies (<250 copies / mL). A negative result must be combined with clinical observations, patient history, and epidemiological information.  Fact Sheet for Patients:   StrictlyIdeas.no  Fact Sheet for Healthcare Providers: BankingDealers.co.za  This test is not yet approved or  cleared by the [place] FDA and has been authorized for detection and/or diagnosis of SARS-CoV-2 by FDA under an Emergency Use Authorization (EUA).  This EUA will remain in effect (meaning this test can be used) for the duration of the COVID-19 declaration under Section 564(b)(1) of the Act, 21 U.S.C. section 360bbb-3(b)(1), unless the authorization is terminated or revoked sooner.  Performed at [facility], [place] [address] [place], [place] [address]   Surgical PCR screen     Status: None   Collection Time: 08/29/20 10:13 PM   Specimen: Nasal Mucosa; Nasal Swab  Result Value Ref Range Status   MRSA, PCR NEGATIVE NEGATIVE Final   Staphylococcus aureus NEGATIVE NEGATIVE Final    Comment: (NOTE) The Xpert SA Assay (FDA approved for NASAL specimens in patients 77 years of age and older), is one component of a comprehensive surveillance program. It is not intended to diagnose infection nor to guide or monitor treatment. Performed at [facility], [place] [address] [place], [place] [address]          Radiology Studies: No results  found.      Scheduled Meds: . acetaminophen  1,000 mg Per Tube Q6H  . Chlorhexidine Gluconate Cloth  6 each Topical Q0600  . doxercalciferol  3 mcg Intravenous Q T,Th,Sa-HD  . fluticasone furoate-vilanterol  1 puff Inhalation Daily  . gabapentin  300 mg Oral TID  . heparin injection (subcutaneous)  5,000 Units Subcutaneous Q8H  . insulin aspart  0-15 Units Subcutaneous Q4H  . mouth rinse  15 mL Mouth Rinse BID  . methocarbamol  1,000 mg Per Tube Q8H  . [START ON 09/02/2020] midodrine  5 mg Oral Q T,Th,Sa-HD  .  multivitamin  1 tablet Oral QHS  . mupirocin ointment  1 application Nasal BID  . pantoprazole (PROTONIX) IV  40 mg Intravenous Q24H   Continuous Infusions: . sodium chloride 10 mL/hr (08/30/20 1800)  . sodium chloride    . ceFEPime (MAXIPIME) IV 2 g (08/31/20 2117)  . dextrose 50 mL/hr at 08/30/20 1130  . vancomycin 1,000 mg (08/31/20 1423)          Aline August, MD [facility] 09/01/2020, 7:29 AM

## 2020-09-02 DIAGNOSIS — K56600 Partial intestinal obstruction, unspecified as to cause: Secondary | ICD-10-CM | POA: Diagnosis not present

## 2020-09-02 DIAGNOSIS — D631 Anemia in chronic kidney disease: Secondary | ICD-10-CM

## 2020-09-02 DIAGNOSIS — Z7189 Other specified counseling: Secondary | ICD-10-CM | POA: Diagnosis not present

## 2020-09-02 DIAGNOSIS — N184 Chronic kidney disease, stage 4 (severe): Secondary | ICD-10-CM

## 2020-09-02 DIAGNOSIS — N186 End stage renal disease: Secondary | ICD-10-CM | POA: Diagnosis not present

## 2020-09-02 DIAGNOSIS — Z515 Encounter for palliative care: Secondary | ICD-10-CM | POA: Diagnosis not present

## 2020-09-02 DIAGNOSIS — L97519 Non-pressure chronic ulcer of other part of right foot with unspecified severity: Secondary | ICD-10-CM | POA: Diagnosis not present

## 2020-09-02 LAB — CBC WITH DIFFERENTIAL/PLATELET
Abs Immature Granulocytes: 0.09 10*3/uL — ABNORMAL HIGH (ref 0.00–0.07)
Basophils Absolute: 0 10*3/uL (ref 0.0–0.1)
Basophils Relative: 0 %
Eosinophils Absolute: 0.2 10*3/uL (ref 0.0–0.5)
Eosinophils Relative: 3 %
HCT: 22.2 % — ABNORMAL LOW (ref 36.0–46.0)
Hemoglobin: 7.2 g/dL — ABNORMAL LOW (ref 12.0–15.0)
Immature Granulocytes: 2 %
Lymphocytes Relative: 14 %
Lymphs Abs: 0.9 10*3/uL (ref 0.7–4.0)
MCH: 31.2 pg (ref 26.0–34.0)
MCHC: 32.4 g/dL (ref 30.0–36.0)
MCV: 96.1 fL (ref 80.0–100.0)
Monocytes Absolute: 0.3 10*3/uL (ref 0.1–1.0)
Monocytes Relative: 6 %
Neutro Abs: 4.7 10*3/uL (ref 1.7–7.7)
Neutrophils Relative %: 75 %
Platelets: 82 10*3/uL — ABNORMAL LOW (ref 150–400)
RBC: 2.31 MIL/uL — ABNORMAL LOW (ref 3.87–5.11)
RDW: 15.3 % (ref 11.5–15.5)
WBC: 6.1 10*3/uL (ref 4.0–10.5)
nRBC: 0 % (ref 0.0–0.2)

## 2020-09-02 LAB — BASIC METABOLIC PANEL
Anion gap: 16 — ABNORMAL HIGH (ref 5–15)
BUN: 40 mg/dL — ABNORMAL HIGH (ref 8–23)
CO2: 22 mmol/L (ref 22–32)
Calcium: 7.5 mg/dL — ABNORMAL LOW (ref 8.9–10.3)
Chloride: 99 mmol/L (ref 98–111)
Creatinine, Ser: 6.67 mg/dL — ABNORMAL HIGH (ref 0.44–1.00)
GFR, Estimated: 6 mL/min — ABNORMAL LOW (ref >60)
Glucose, Bld: 103 mg/dL — ABNORMAL HIGH (ref 70–99)
Potassium: 3.3 mmol/L — ABNORMAL LOW (ref 3.5–5.1)
Sodium: 137 mmol/L (ref 135–145)

## 2020-09-02 LAB — GLUCOSE, CAPILLARY
Glucose-Capillary: 102 mg/dL — ABNORMAL HIGH (ref 70–99)
Glucose-Capillary: 109 mg/dL — ABNORMAL HIGH (ref 70–99)
Glucose-Capillary: 179 mg/dL — ABNORMAL HIGH (ref 70–99)
Glucose-Capillary: 191 mg/dL — ABNORMAL HIGH (ref 70–99)
Glucose-Capillary: 97 mg/dL (ref 70–99)

## 2020-09-02 MED ORDER — DOXERCALCIFEROL 4 MCG/2ML IV SOLN
INTRAVENOUS | Status: AC
  Filled 2020-09-02: qty 2

## 2020-09-02 MED ORDER — VANCOMYCIN HCL IN DEXTROSE 1-5 GM/200ML-% IV SOLN
INTRAVENOUS | Status: AC
  Administered 2020-09-02: 1000 mg via INTRAVENOUS
  Filled 2020-09-02: qty 200

## 2020-09-02 NOTE — Plan of Care (Signed)
  Problem: Education: Goal: Knowledge of General Education information will improve Description: Including pain rating scale, medication(s)/side effects and non-pharmacologic comfort measures 09/02/2020 0141 by Dorena Cookey, LPN Outcome: Progressing 09/02/2020 0139 by Dorena Cookey, LPN Outcome: Progressing   Problem: Health Behavior/Discharge Planning: Goal: Ability to manage health-related needs will improve 09/02/2020 0141 by Dorena Cookey, LPN Outcome: Progressing 09/02/2020 0139 by Dorena Cookey, LPN Outcome: Progressing   Problem: Clinical Measurements: Goal: Will remain free from infection 09/02/2020 0141 by Dorena Cookey, LPN Outcome: Progressing 09/02/2020 0139 by Dorena Cookey, LPN Outcome: Progressing Goal: Diagnostic test results will improve 09/02/2020 0141 by Dorena Cookey, LPN Outcome: Progressing 09/02/2020 0139 by Dorena Cookey, LPN Outcome: Progressing   Problem: Activity: Goal: Risk for activity intolerance will decrease Outcome: Progressing   Problem: Nutrition: Goal: Adequate nutrition will be maintained Outcome: Progressing   Problem: Coping: Goal: Level of anxiety will decrease Outcome: Progressing   Problem: Elimination: Goal: Will not experience complications related to bowel motility Outcome: Progressing Goal: Will not experience complications related to urinary retention Outcome: Progressing   Problem: Pain Managment: Goal: General experience of comfort will improve Outcome: Progressing   Problem: Safety: Goal: Ability to remain free from injury will improve Outcome: Progressing   Problem: Skin Integrity: Goal: Risk for impaired skin integrity will decrease Outcome: Progressing

## 2020-09-02 NOTE — Progress Notes (Signed)
PT Cancellation Note  Patient Details Name: [name] MRN: [id number] DOB: 1951/05/17   Cancelled Treatment:    Reason Eval/Treat Not Completed: Patient at procedure or test/unavailable (HD). Will follow-up for PT treatment as schedule permits.  Mabeline Caras, PT, DPT Acute Rehabilitation Services  Pager [phone] Office [place] 09/02/2020, 8:31 AM

## 2020-09-02 NOTE — Progress Notes (Signed)
Patient ID: [name], female   DOB: 04-12-1951, 70 y.o.   MRN: [id number]  PROGRESS NOTE    LUMA CLOPPER  WUJ:[id number] DOB: 23-Jun-1951 DOA: 08/27/2020 PCP: [name], MD   Brief Narrative:  70 year old female history of asthma, anemia, renal cell carcinoma status post nephrectomy, type 2 diabetes mellitus, end-stage renal disease on hemodialysis Tuesday Thursday Saturday, peripheral vascular disease, hypertension, hyperlipidemia, GERD, diabetic retinopathy as well as neuropathy presented to the ED with acute abdominal pain, intractable nausea and vomiting to liquids and solids. Patient noted to have missed hemodialysis on Tuesday and Saturday prior to admission.  Patient also noted to have been scheduled for amputation of the right third toe per podiatry.  In the ED: work-up with CT abdomen and pelvis revealed small and large bowel obstruction secondary to previously demonstrated colonic mass in the ascending colon. NG tube placed.  Nephrology consulted.  GI consulted as well as general surgery.  She underwent colonoscopy on 08/29/2020 which showed likely malignant tumor which was biopsied.  She underwent surgical intervention on 08/30/2020  Assessment & Plan:   Small and large bowel obstruction secondary right ascending colon mass New diagnosis of colonic adenocarcinoma -Status post colonoscopy on 08/29/2020 by GI which showed likely malignant tumor, biopsied, removed for 3 to 8 mm polyps, pathology shows colonic adenocarcinoma. -Status post exploratory laparotomy, right hemicolectomy, ileostomy creation on 08/30/2020.  Wound care/diet as per general surgery recommendations.  NG tube removed yesterday as per general surgery recommendations. -CT of the chest without contrast showed lingular nodule, slowly enlarging and recommended surveillance -Currently on gentle hydration.   -Oncology evaluation appreciated: Awaiting final pathology from surgery.   Palliative care evaluation has evaluated  the patient and patient has been changed to DNR  End-stage renal disease on hemodialysis -Dialysis as per nephrology schedule.  Nephrology following  Diabetes mellitus type 2 with hyperglycemia and diabetic neuropathy and retinopathy -A1c 7.8.  Continue CBGs with SSI  Bilateral third toes cellulitis/ulcer with chronic osteomyelitis -Followed by podiatry Dr Amalia Hailey with plans for possible amputation during this admission Continue empiric IV vancomycin and cefepime -Spoke to Dr. Amalia Hailey on phone on 08/31/2020 and he is planning for surgical intervention tentatively on Monday if patient remains stable.  Anemia of chronic disease -Hemoglobin stable.  Transfuse if hemoglobin less than 7.  Thrombocytopenia -Questionable cause.  No signs of bleeding  Hyperlipidemia -Resume statin probably upon discharge once medically stable  Asthma without exacerbation -Continue home regimen Breo Ellipta and albuterol inhaler.  Generalized deconditioning -Will need SNF on discharge    DVT prophylaxis: Heparin Code Status: DNR Family Communication: Spoke to son/Kevin on phone on 09/01/20 Disposition Plan: Status is: Inpatient  Remains inpatient appropriate because:Hemodynamically unstable and Inpatient level of care appropriate due to severity of illness   Dispo: The patient is from: Home              Anticipated d/c is to: SNF              Anticipated d/c date is: > 3 days              Patient currently is not medically stable to d/c.   Difficult to place patient No   Consultants: GI/general surgery/nephrology/palliative care  Procedures: Colonoscopy on 08/29/2020 Right hemicolectomy on 08/30/2020  Antimicrobials: Vancomycin and cefepime from 08/27/2020 onwards   Subjective: Patient seen and examined at bedside undergoing dialysis.  Poor historian.  No overnight fever, vomiting reported.  Complains of intermittent abdominal pain.  Objective: Vitals:   09/01/20 0842 09/01/20 1640 09/01/20  2003 09/02/20 0515  BP: (!) 94/56 (!) 103/49 109/68 (!) 110/55  Pulse: 83 85 86 68  Resp: 18 16 19 20   Temp: 98.6 F (37 C) 99.1 F (37.3 C) 98.2 F (36.8 C) 97.7 F (36.5 C)  TempSrc: Oral Oral Oral Oral  SpO2: 100% 97% 98% 96%  Weight:      Height:        Intake/Output Summary (Last 24 hours) at 09/02/2020 0734 Last data filed at 09/02/2020 0500 Gross per 24 hour  Intake 1548.33 ml  Output 790 ml  Net 758.33 ml   Filed Weights   08/31/20 1140 08/31/20 1450 08/31/20 2041  Weight: 86.7 kg 86.7 kg 88.9 kg    Examination:  General exam: Chronically ill looking.  Poor historian.  Currently on room air. Respiratory system: Decreased breath sounds at bases bilaterally with some crackles cardiovascular system: Rate controlled, S1-S2 heard Gastrointestinal system: Abdomen is mildly distended, soft and dressing present with surrounding tenderness.  Ostomy bag present.  extremities: Lower extremity edema bilaterally; no cyanosis  Central nervous system: More awake, answers some questions, still slow to respond.  No focal neurological deficits.  Moves extremities  skin: No obvious petechiae/other lesions psychiatry: Affect is flat   Data Reviewed: I have personally reviewed following labs and imaging studies  CBC: Recent Labs  Lab 08/29/20 0302 08/30/20 0252 08/30/20 1601 08/31/20 0159 09/01/20 0400 09/02/20 0406  WBC 7.4 6.4  --  11.5* 6.9 6.1  NEUTROABS  --  4.8  --  10.4* 5.5 4.7  HGB 10.6* 9.4* 8.2* 10.0* 8.1* 7.2*  HCT 33.0* 29.6* 24.0* 31.3* 25.0* 22.2*  MCV 96.2 97.7  --  96.3 97.7 96.1  PLT 146* 128*  --  139* 90* 82*   Basic Metabolic Panel: Recent Labs  Lab 08/29/20 0302 08/29/20 0303 08/30/20 0252 08/30/20 1601 08/31/20 0159 09/01/20 0400 09/02/20 0406  NA  --  139 142 139 141 141 137  K  --  4.3 4.0 4.1 5.2* 4.2 3.3*  CL  --  99 103 103 104 101 99  CO2  --  22 21*  --  15* 24 22  GLUCOSE  --  72 84 114* 224* 127* 103*  BUN  --  41* 47* 50* 56*  31* 40*  CREATININE  --  5.74* 7.14* 7.00* 8.03* 5.52* 6.67*  CALCIUM  --  7.4* 7.7*  --  7.5* 8.1* 7.5*  MG 1.8  --   --   --   --  2.0  --   PHOS  --  6.3* 8.0*  --   --   --   --    GFR: Estimated Creatinine Clearance: 9.1 mL/min (A) (by C-G formula based on SCr of 6.67 mg/dL (H)). Liver Function Tests: Recent Labs  Lab 08/27/20 1350 08/28/20 0424 08/29/20 0303 08/30/20 0252  AST 20 17  --   --   ALT 12 11  --   --   ALKPHOS 72 59  --   --   BILITOT 1.1 0.9  --   --   PROT 7.4 6.3*  --   --   ALBUMIN 3.5 2.9* 2.5* 2.3*   Recent Labs  Lab 08/27/20 1350  LIPASE 33   No results for input(s): AMMONIA in the last 168 hours. Coagulation Profile: No results for input(s): INR, PROTIME in the last 168 hours. Cardiac Enzymes: No results for input(s): CKTOTAL, CKMB, CKMBINDEX, TROPONINI in  the last 168 hours. BNP (last 3 results) No results for input(s): PROBNP in the last 8760 hours. HbA1C: No results for input(s): HGBA1C in the last 72 hours. CBG: Recent Labs  Lab 09/01/20 1134 09/01/20 1639 09/01/20 2037 09/02/20 0042 09/02/20 0457  GLUCAP 223* 158* 153* 97 102*   Lipid Profile: No results for input(s): CHOL, HDL, LDLCALC, TRIG, CHOLHDL, LDLDIRECT in the last 72 hours. Thyroid Function Tests: No results for input(s): TSH, T4TOTAL, FREET4, T3FREE, THYROIDAB in the last 72 hours. Anemia Panel: No results for input(s): VITAMINB12, FOLATE, FERRITIN, TIBC, IRON, RETICCTPCT in the last 72 hours. Sepsis Labs: No results for input(s): PROCALCITON, LATICACIDVEN in the last 168 hours.  Recent Results (from the past 240 hour(s))  SARS Coronavirus 2 by RT PCR (hospital order, performed in [facility] hospital lab) Nasopharyngeal Nasopharyngeal Swab     Status: None   Collection Time: 08/27/20  9:46 PM   Specimen: Nasopharyngeal Swab  Result Value Ref Range Status   SARS Coronavirus 2 NEGATIVE NEGATIVE Final    Comment: (NOTE) SARS-CoV-2 target nucleic acids are NOT  DETECTED.  The SARS-CoV-2 RNA is generally detectable in upper and lower respiratory specimens during the acute phase of infection. The lowest concentration of SARS-CoV-2 viral copies this assay can detect is 250 copies / mL. A negative result does not preclude SARS-CoV-2 infection and should not be used as the sole basis for treatment or other patient management decisions.  A negative result may occur with improper specimen collection / handling, submission of specimen other than nasopharyngeal swab, presence of viral mutation(s) within the areas targeted by this assay, and inadequate number of viral copies (<250 copies / mL). A negative result must be combined with clinical observations, patient history, and epidemiological information.  Fact Sheet for Patients:   StrictlyIdeas.no  Fact Sheet for Healthcare Providers: BankingDealers.co.za  This test is not yet approved or  cleared by the [place] FDA and has been authorized for detection and/or diagnosis of SARS-CoV-2 by FDA under an Emergency Use Authorization (EUA).  This EUA will remain in effect (meaning this test can be used) for the duration of the COVID-19 declaration under Section 564(b)(1) of the Act, 21 U.S.C. section 360bbb-3(b)(1), unless the authorization is terminated or revoked sooner.  Performed at [facility], [place] [address] [place], [place] [address]   Surgical PCR screen     Status: None   Collection Time: 08/29/20 10:13 PM   Specimen: Nasal Mucosa; Nasal Swab  Result Value Ref Range Status   MRSA, PCR NEGATIVE NEGATIVE Final   Staphylococcus aureus NEGATIVE NEGATIVE Final    Comment: (NOTE) The Xpert SA Assay (FDA approved for NASAL specimens in patients 37 years of age and older), is one component of a comprehensive surveillance program. It is not intended to diagnose infection nor to guide or monitor treatment. Performed at [facility], [place] [address], [place],  [address]          Radiology Studies: No results found.      Scheduled Meds: . acetaminophen  1,000 mg Oral Q6H  . Chlorhexidine Gluconate Cloth  6 each Topical Q0600  . Chlorhexidine Gluconate Cloth  6 each Topical Q0600  . doxercalciferol  3 mcg Intravenous Q T,Th,Sa-HD  . fluticasone furoate-vilanterol  1 puff Inhalation Daily  . gabapentin  300 mg Oral TID  . heparin injection (subcutaneous)  5,000 Units Subcutaneous Q8H  . insulin aspart  0-15 Units Subcutaneous Q4H  . mouth  rinse  15 mL Mouth Rinse BID  . methocarbamol  1,000 mg Oral Q8H  . midodrine  5 mg Oral Q T,Th,Sa-HD  . multivitamin  1 tablet Oral QHS  . mupirocin ointment  1 application Nasal BID  . pantoprazole (PROTONIX) IV  40 mg Intravenous Q24H   Continuous Infusions: . sodium chloride 10 mL/hr (08/30/20 1800)  . sodium chloride    . ceFEPime (MAXIPIME) IV 2 g (08/31/20 2117)  . dextrose 50 mL/hr at 08/30/20 1130  . vancomycin 1,000 mg (08/31/20 1423)          Aline August, MD [facility] 09/02/2020, 7:34 AM

## 2020-09-02 NOTE — Progress Notes (Signed)
3 Days Post-Op    CC: Abdominal pain and emesis.  Subjective: On dialysis this AM.  Tolerating clear liquids well.  Ostomy is working well with liquid stool in the ostomy bag.  Patient is overall feeling much better.  Objective: Vital signs in last 24 hours: Temp:  [97.7 F (36.5 C)-99.1 F (37.3 C)] 97.8 F (36.6 C) (02/12 0830) Pulse Rate:  [68-86] 78 (02/12 0900) Resp:  [16-20] 16 (02/12 0830) BP: (91-110)/(49-68) 92/52 (02/12 0900) SpO2:  [96 %-99 %] 98 % (02/12 0900) Weight:  [88.9 kg] 88.9 kg (02/12 0830) Last BM Date: 09/01/20 1210 p.o. 338 IV No urine. NG 450 -discontinued 2/11 Stool 340 recorded. Afebrile blood pressure is down to 89/60 this morning; she is currently on dialysis Potassium is 3.3, creatinine 6.67. WBC 6.1 H/H 7.2/22.2  Intake/Output from previous day: 02/11 0701 - 02/12 0700 In: 1548.3 [P.O.:1210; I.V.:238.3; IV Piggyback:100] Out: 790 [Emesis/NG output:450; Stool:340] Intake/Output this shift: No intake/output data recorded.  General appearance: alert, cooperative and no distress Resp: clear to auscultation bilaterally and Anterior exam GI: Soft, normal postop soreness.  Ostomy is working well, stool and gas in the bag.  Tolerating clear liquids.  Lab Results:  Recent Labs    09/01/20 0400 09/02/20 0406  WBC 6.9 6.1  HGB 8.1* 7.2*  HCT 25.0* 22.2*  PLT 90* 82*    BMET Recent Labs    09/01/20 0400 09/02/20 0406  NA 141 137  K 4.2 3.3*  CL 101 99  CO2 24 22  GLUCOSE 127* 103*  BUN 31* 40*  CREATININE 5.52* 6.67*  CALCIUM 8.1* 7.5*   PT/INR No results for input(s): LABPROT, INR in the last 72 hours.  Recent Labs  Lab 08/27/20 1350 08/28/20 0424 08/29/20 0303 08/30/20 0252  AST 20 17  --   --   ALT 12 11  --   --   ALKPHOS 72 59  --   --   BILITOT 1.1 0.9  --   --   PROT 7.4 6.3*  --   --   ALBUMIN 3.5 2.9* 2.5* 2.3*     Lipase     Component Value Date/Time   LIPASE 33 08/27/2020 1350     Medications: .  acetaminophen  1,000 mg Oral Q6H  . Chlorhexidine Gluconate Cloth  6 each Topical Q0600  . doxercalciferol  3 mcg Intravenous Q T,Th,Sa-HD  . fluticasone furoate-vilanterol  1 puff Inhalation Daily  . gabapentin  300 mg Oral TID  . insulin aspart  0-15 Units Subcutaneous Q4H  . mouth rinse  15 mL Mouth Rinse BID  . methocarbamol  1,000 mg Oral Q8H  . midodrine  5 mg Oral Q T,Th,Sa-HD  . multivitamin  1 tablet Oral QHS  . mupirocin ointment  1 application Nasal BID  . pantoprazole (PROTONIX) IV  40 mg Intravenous Q24H    Assessment/Plan ESRD on HD TTS- hyperkalemia on todays labs (K 5.2) Hx renal cell carcinoma; s/p partial right nephrectomy adrenalectomy 2018 Type 2 diabetes; diabetic neuropathy, diabetic retinopathy. 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 Osteo of b/l third LE digits - podiatry following GERD Hyperlipidemia Hx asthma Protein Calorie Malnutrition - Pre-alb 11.4.Nutrition consult when tolerating diet. If develops prolonged ileus, may need TPN. Anemia   - H/H 9.4/29.6>>8.2/24>>10/31.3>>8.1/25   Adenocarcinomaof the colon w/ nearly obstructing mass S/p exploratory laparotomy,right hemicolectomy, ileostomy creation- Dr. Bobbye Morton - 08/30/2020 POD #2 - Path from colonoscopy reveals colon mass bx w/ poorly differentiated adenocarcinoma with signet ring  features. Polypectomy w/ tubular adenoma w/ no high grade dysplasia or carcinoma.  - CEA 3.7 - Surgical path pending.  -CT of the chest without contrast showed lingular nodule, slowly enlargingover the last 2 years.  - CT of the A/P w/few mildly enlarged mesenteric lymph nodes in the right lower quadrant and right mid abdomen, concerning for nodal metastatic disease. - It appears thatoncology and palliative carehave already been consultedfor new diagnosis of colonic adenocarcinoma - Await return of bowel function. Keep NPO and NGT to LIWS. If does not have ROBF in a few days, will order TPN -  Mobilize - PT consult - Pulm toilet - WOCN following for new ostomy  FEN -NPO, NGT, IVF per TRH>> clamping NG and checking later hope to remove, clear liquids VTE -SCDs, Heparin subq ID -Vanc and Cefepime for osteo (per podiatry). Received clinda and gent periop.  Follow-Up- Dr. Bobbye Morton   Plan: Advance to full liquids, she does well soft diet in the a.m.  Continue to mobilize.  She is getting OT and PT.  She has been seen by oncology and palliative.  Hopefully home in the next 24 to 48 hours    LOS: 6 days    Saundra Gin 09/02/2020 Please see Amion

## 2020-09-02 NOTE — Progress Notes (Signed)
[facility] Progress Note   Subjective:  Seen at start of dialysis.  Feeling well, no new concerns. Denies SOB, CP, dizziness, abdominal pain, and nausea.   Objective Vitals:   09/01/20 0842 09/01/20 1640 09/01/20 2003 09/02/20 0515  BP: (!) 94/56 (!) 103/49 109/68 (!) 110/55  Pulse: 83 85 86 68  Resp: 18 16 19 20   Temp: 98.6 F (37 C) 99.1 F (37.3 C) 98.2 F (36.8 C) 97.7 F (36.5 C)  TempSrc: Oral Oral Oral Oral  SpO2: 100% 97% 98% 96%  Weight:      Height:       Physical Exam General: Chronically ill appearing female, alert, in NAD.  Heart: RRR, no murmurs, rubs or gallops Lungs: CTA bilaterally, respirations unlabored on O2 via [place] Abdomen: Soft, non-distended, +BS, midline incision with dressing, + ostomy Extremities: No edema b/l lower extremities Dialysis Access: LUE AVF + bruit  Additional Objective Labs: Basic Metabolic Panel: Recent Labs  Lab 08/29/20 0303 08/30/20 0252 08/30/20 1601 08/31/20 0159 09/01/20 0400 09/02/20 0406  NA 139 142   < > 141 141 137  K 4.3 4.0   < > 5.2* 4.2 3.3*  CL 99 103   < > 104 101 99  CO2 22 21*  --  15* 24 22  GLUCOSE 72 84   < > 224* 127* 103*  BUN 41* 47*   < > 56* 31* 40*  CREATININE 5.74* 7.14*   < > 8.03* 5.52* 6.67*  CALCIUM 7.4* 7.7*  --  7.5* 8.1* 7.5*  PHOS 6.3* 8.0*  --   --   --   --    < > = values in this interval not displayed.   Liver Function Tests: Recent Labs  Lab 08/27/20 1350 08/28/20 0424 08/29/20 0303 08/30/20 0252  AST 20 17  --   --   ALT 12 11  --   --   ALKPHOS 72 59  --   --   BILITOT 1.1 0.9  --   --   PROT 7.4 6.3*  --   --   ALBUMIN 3.5 2.9* 2.5* 2.3*   Recent Labs  Lab 08/27/20 1350  LIPASE 33   CBC: Recent Labs  Lab 08/29/20 0302 08/29/20 0302 08/30/20 0252 08/30/20 1601 08/31/20 0159 09/01/20 0400 09/02/20 0406  WBC 7.4  --  6.4  --  11.5* 6.9 6.1  NEUTROABS  --    < > 4.8  --  10.4* 5.5 4.7  HGB 10.6*  --  9.4*   < > 10.0* 8.1* 7.2*  HCT 33.0*   --  29.6*   < > 31.3* 25.0* 22.2*  MCV 96.2  --  97.7  --  96.3 97.7 96.1  PLT 146*  --  128*  --  139* 90* 82*   < > = values in this interval not displayed.   Blood Culture    Component Value Date/Time   SDES  06/28/2020 1615    WOUND LT 3RD TOE Performed at [facility] [address], [place], [place] [address]    [place]  06/28/2020 1615    NONE Performed at [facility], [place] [address] [place], [place] [address]    CULT MODERATE STAPHYLOCOCCUS AUREUS 06/28/2020 1615   REPTSTATUS 07/01/2020 FINAL 06/28/2020 1615    CBG: Recent Labs  Lab 09/01/20 1134 09/01/20 1639 09/01/20 2037 09/02/20 0042 09/02/20 0457  GLUCAP 223* 158* 153* 97 102*   Medications: . sodium chloride 10  mL/hr (08/30/20 1800)  . sodium chloride    . ceFEPime (MAXIPIME) IV 2 g (08/31/20 2117)  . dextrose 50 mL/hr at 08/30/20 1130  . vancomycin 1,000 mg (08/31/20 1423)   . acetaminophen  1,000 mg Oral Q6H  . Chlorhexidine Gluconate Cloth  6 each Topical Q0600  . doxercalciferol  3 mcg Intravenous Q T,Th,Sa-HD  . fluticasone furoate-vilanterol  1 puff Inhalation Daily  . gabapentin  300 mg Oral TID  . insulin aspart  0-15 Units Subcutaneous Q4H  . mouth rinse  15 mL Mouth Rinse BID  . methocarbamol  1,000 mg Oral Q8H  . midodrine  5 mg Oral Q T,Th,Sa-HD  . multivitamin  1 tablet Oral QHS  . mupirocin ointment  1 application Nasal BID  . pantoprazole (PROTONIX) IV  40 mg Intravenous Q24H    Dialysis Orders: [facility] 3h 58min 400/500 87kg 2/2.25 bath P2 LUA AVF Hep 5000+ 1000 midrun - mircera 100ug last 1/11 - hect 3 ug  Assessment/Plan: 1. ESRD -Normally dialyzes TTS, wasoff schedulesecondary to procedures and numerous emergent patients. Back on schedule today, continue TTS schedule. Holding heparin with HD post-op 2. Adenocarcinoma of colon with nearly obstructing mass- status postExploratory lap /right hemicolectomy, ileostomy creation  08/30/20-CCS, GIseeingand oncology and palliative care been consulted for new diagnosis of colonic adenocarcinoma 3. Hypertension/volume-BP Soft/stable,uses midodrine for BP support with dialysis, appears euvolemic 4. Renal osteodystrophy -Corrected calcium controlled. Phos has been elevated, takes Tums as her binder (has refused other binders). Continue hectorol with HD. 5. Anemia - Hgb variable, trended down to 7.2 today. Stopping mircera with new dx of malignancy. No IV iron with osteomyelitis. Transfuse PRN 6. DMtype 2- peradmit 7. PAD -Podiatry tentatively planning for surgery Monday, 2/14 evening if patient is stable. 8. Asthma without exasperation on meds per admit   Anice Paganini, PA-C 09/02/2020, 8:21 AM  [facility] Pager: [phone]

## 2020-09-02 NOTE — Progress Notes (Signed)
OT Cancellation Note  Patient Details Name: [name] MRN: [id number] DOB: [birth year]   Cancelled Treatment:    Reason Eval/Treat Not Completed: Patient at procedure or test/ unavailable (Pt at HD this AM. Plan to see pt next available treatment day.)   Jefferey Pica, OTR/L Acute Rehabilitation Services Pager: [phone] Office: [phone]  ALLISON  C 09/02/2020, 12:56 PM

## 2020-09-02 NOTE — Progress Notes (Signed)
Palliative Medicine Inpatient Follow Up Note   Reason for consult:  Goals of Care "Goals of care. New diagnosis of colon cancer"  HPI:  Per intake H&P --> WandaJonesis a34 y.o.Caucasian femalewith a known history of asthma, anemia, renal cell carcinoma status post radical nephrectomy, type 2 diabetes mellitus, peripheral vascular disease, end-stage renal disease on hemodialysis, hypertension, dyslipidemia, GERD and diabetic retinopathy as well as neuropathy, who presented to the emergency room with acute onset of abdominal pain withintractable and nausea and vomiting to liquids and solids as well asassociated constipation over a week ago and has been taking over-the-counter stool softener with good results.Upon workup she has been identified to have adenocarcinoma of the colon. Oncology is involved and awaiting the results of pathology results for staging. Palliative care has been asked to discuss goals of care with [name] in light of this new diagnosis.   Today's Discussion (09/02/2020):  *Please note that this is a verbal dictation therefore any spelling or grammatical errors are due to the "[place] [facility]" system interpretation.  Chart reviewed.  I met with [name] this evening. She and I talked about her present adenocarcinoma diagnosis and what this means in the bigger pictures. She shares with me that her goals are to improve her situation as much as possible. She is anxious to speak to the oncology team to see what their recommendations will be. She shares with me that she will request her sister goes to the appointments with her so that there is additional support to relay information to her sons.  We spent time talking about what is most important to [name]. She expresses that she has many pets and they are what matter most to her.   Discussed the importance of continued conversation with family and their  medical providers regarding overall plan of care and treatment options,  ensuring decisions are within the context of the patients values and GOCs.  Questions and concerns addressed   Objective Assessment: Vital Signs Vitals:   09/02/20 1155 09/02/20 1700  BP: (!) 101/53 104/60  Pulse: 85 84  Resp: 17 17  Temp: 98.4 F (36.9 C) 98.3 F (36.8 C)  SpO2: 98% 98%    Intake/Output Summary (Last 24 hours) at 09/02/2020 1841 Last data filed at 09/02/2020 1700 Gross per 24 hour  Intake 480 ml  Output 1890 ml  Net -1410 ml   Last Weight  Most recent update: 09/02/2020 12:07 PM   Weight  87.5 kg (192 lb 14.4 oz)           Gen:  Obese elderly Caucasian F in NAD HEENT: Dry mucous membranes  CV: Regular rate and rhythm  PULM: clear to auscultation bilaterally  ABD: soft/nontender  EXT: (+) generalized edema Neuro: Alert and oriented x4  SUMMARY OF RECOMMENDATIONS DNAR/DNI  MOST Completed, paper copy placed onto the chart electric copy can be found in [facility]  DNR Form Completed, paper copy placed onto the chart electric copy can be found in [facility]  Generalized Weakness - PT/OT and SNF placement  TOC - OP Palliative Care  Incremental PMT support  Time Spent: 25 Greater than 50% of the time was spent in counseling and coordination of care ______________________________________________________________________________________ [place] Team Team Cell Phone: [phone] Please utilize secure chat with additional questions, if there is no response within 30 minutes please call the above phone number  Palliative Medicine Team providers are available by phone from 7am to 7pm daily and can be reached  through the team cell phone.  Should this patient require assistance outside of these hours, please call the patient's attending physician.

## 2020-09-02 NOTE — Progress Notes (Signed)
Patient stated that when she woke up she didn't know where she was and that her speech was slurred. Neuro assessment was normal speech cleared up after some water. Vital signs taken and within normal limits charted in epic. Arthor Captain LPN

## 2020-09-03 DIAGNOSIS — Z515 Encounter for palliative care: Secondary | ICD-10-CM | POA: Diagnosis not present

## 2020-09-03 DIAGNOSIS — L97519 Non-pressure chronic ulcer of other part of right foot with unspecified severity: Secondary | ICD-10-CM | POA: Diagnosis not present

## 2020-09-03 DIAGNOSIS — K56600 Partial intestinal obstruction, unspecified as to cause: Secondary | ICD-10-CM | POA: Diagnosis not present

## 2020-09-03 DIAGNOSIS — C182 Malignant neoplasm of ascending colon: Secondary | ICD-10-CM | POA: Diagnosis not present

## 2020-09-03 DIAGNOSIS — N186 End stage renal disease: Secondary | ICD-10-CM | POA: Diagnosis not present

## 2020-09-03 LAB — CBC WITH DIFFERENTIAL/PLATELET
Abs Immature Granulocytes: 0.17 10*3/uL — ABNORMAL HIGH (ref 0.00–0.07)
Basophils Absolute: 0 10*3/uL (ref 0.0–0.1)
Basophils Relative: 0 %
Eosinophils Absolute: 0.1 10*3/uL (ref 0.0–0.5)
Eosinophils Relative: 2 %
HCT: 23.4 % — ABNORMAL LOW (ref 36.0–46.0)
Hemoglobin: 7.5 g/dL — ABNORMAL LOW (ref 12.0–15.0)
Immature Granulocytes: 3 %
Lymphocytes Relative: 20 %
Lymphs Abs: 1.1 10*3/uL (ref 0.7–4.0)
MCH: 31.3 pg (ref 26.0–34.0)
MCHC: 32.1 g/dL (ref 30.0–36.0)
MCV: 97.5 fL (ref 80.0–100.0)
Monocytes Absolute: 0.3 10*3/uL (ref 0.1–1.0)
Monocytes Relative: 6 %
Neutro Abs: 3.7 10*3/uL (ref 1.7–7.7)
Neutrophils Relative %: 69 %
Platelets: 102 10*3/uL — ABNORMAL LOW (ref 150–400)
RBC: 2.4 MIL/uL — ABNORMAL LOW (ref 3.87–5.11)
RDW: 15.1 % (ref 11.5–15.5)
WBC: 5.4 10*3/uL (ref 4.0–10.5)
nRBC: 0 % (ref 0.0–0.2)

## 2020-09-03 LAB — GLUCOSE, CAPILLARY
Glucose-Capillary: 159 mg/dL — ABNORMAL HIGH (ref 70–99)
Glucose-Capillary: 403 mg/dL — ABNORMAL HIGH (ref 70–99)
Glucose-Capillary: 162 mg/dL — ABNORMAL HIGH (ref 70–99)

## 2020-09-03 LAB — BASIC METABOLIC PANEL
Anion gap: 13 (ref 5–15)
BUN: 23 mg/dL (ref 8–23)
CO2: 25 mmol/L (ref 22–32)
Calcium: 7.6 mg/dL — ABNORMAL LOW (ref 8.9–10.3)
Chloride: 99 mmol/L (ref 98–111)
Creatinine, Ser: 4.78 mg/dL — ABNORMAL HIGH (ref 0.44–1.00)
GFR, Estimated: 9 mL/min — ABNORMAL LOW (ref >60)
Glucose, Bld: 117 mg/dL — ABNORMAL HIGH (ref 70–99)
Potassium: 3.5 mmol/L (ref 3.5–5.1)
Sodium: 137 mmol/L (ref 135–145)

## 2020-09-03 MED ORDER — INSULIN ASPART 100 UNIT/ML ~~LOC~~ SOLN
0.0000 [IU] | Freq: Three times a day (TID) | SUBCUTANEOUS | Status: DC
  Administered 2020-09-03: 6 [IU] via SUBCUTANEOUS
  Administered 2020-09-03 – 2020-09-05 (×3): 1 [IU] via SUBCUTANEOUS

## 2020-09-03 MED ORDER — INSULIN ASPART 100 UNIT/ML ~~LOC~~ SOLN: 0.0000 [IU] | Freq: Every day | SUBCUTANEOUS | Status: DC

## 2020-09-03 NOTE — Progress Notes (Signed)
   Palliative Medicine Inpatient Follow Up Note  Reason for consult:  Goals of Care "Goals of care. New diagnosis of colon cancer"  HPI:  Per intake H&P --> WandaJonesis a61 y.o.Caucasian femalewith a known history of asthma, anemia, renal cell carcinoma status post radical nephrectomy, type 2 diabetes mellitus, peripheral vascular disease, end-stage renal disease on hemodialysis, hypertension, dyslipidemia, GERD and diabetic retinopathy as well as neuropathy, who presented to the emergency room with acute onset of abdominal pain withintractable and nausea and vomiting to liquids and solids as well asassociated constipation over a week ago and has been taking over-the-counter stool softener with good results.Upon workup she has been identified to have adenocarcinoma of the colon. On 2/9 s/p exploratory laparotomy,right hemicolectomy, ileostomy creation. Oncology is involved and awaiting the results of pathology results for staging. Palliative care has been asked to discuss goals of care with [name] in light of this new diagnosis.   Today's Discussion (09/03/2020):  *Please note that this is a verbal dictation therefore any spelling or grammatical errors are due to the "[place] [facility]" system interpretation.  Chart reviewed.  I checked in on [name] this afternoon though she was noted to me napping and in no distress per nonverbal indicator review.  Plan for possible bilateral third toe amputations tomorrow.   [name] has been advanced to renal diet per general surgery and is tolerating this well.   Objective Assessment: Vital Signs Vitals:   09/03/20 0848 09/03/20 1100  BP:  104/60  Pulse: 78 79  Resp: 18 18  Temp:  98.7 F (37.1 C)  SpO2: 97% 98%    Intake/Output Summary (Last 24 hours) at 09/03/2020 1459 Last data filed at 09/03/2020 0900 Gross per 24 hour  Intake 940 ml  Output 325 ml  Net 615 ml   Last Weight  Most recent update: 09/02/2020 12:07 PM   Weight  87.5  kg (192 lb 14.4 oz)           Gen:  Obese elderly Caucasian F in NAD HEENT: Dry mucous membranes  CV: Regular rate and rhythm  PULM: 2LPM [place] ABD: soft/nontender  EXT: (+) generalized edema Neuro: Sleeping - UTA  SUMMARY OF RECOMMENDATIONS DNAR/DNI  MOST Completed, paper copy placed onto the chart electric copy can be found in Vyn[facility]  DNR Form Completed, paper copy placed onto the chart electric copy can be found in [facility]yn[facility]  Generalized Weakness - PT/OT and SNF placement  TOC - OP Palliative Care to continue goals of care conversations  Palliative care will incrementally continue to follow along please contact us if additional support is needed at any time  Time Spent: 15 Greater than 50% of the time was spent in counseling and coordination of care ______________________________________________________________________________________ [place] Team Team Cell Phone: [phone] Please utilize secure chat with additional questions, if there is no response within 30 minutes please call the above phone number  Palliative Medicine Team providers are available by phone from 7am to 7pm daily and can be reached through the team cell phone.  Should this patient require assistance outside of these hours, please call the patient's attending physician.

## 2020-09-03 NOTE — Progress Notes (Signed)
Pharmacy Antibiotic Note  [name] is a 70 y.o. female admitted on 08/27/2020 with abd pain and found to have bowel obstruction.  Now s/o ex-lap 2/9 with hemicolectomy and ostomy placement.  Pt was on antibiotics PTA for cellulitis / toe ulcer and dog bite to L hand.  Pharmacy has been consulted for Vancomycin and Cefepime dosing pending toe surgery plans.  Pt has ESRD with usual HD on TTS however was off schedule.  Patient is now back on usual TTS schedule.  Last HD 2/12.  Podiatry is planning for toe amputation 2/14 PM as long as patient remains stable.      Plan: Cefepime to 2gm IV qTTS Vancomycin1000mg IV x qHD TTS  > f/u abx plans, vanc level pre-HD prn   > have not ordered pre-HD level for 2/14 in anticipation of abx stopping after amputation  Height: 5\' 7"  (170.2 cm) Weight: 87.5 kg (192 lb 14.4 oz) IBW/kg (Calculated) : 61.6  Temp (24hrs), Avg:98.4 F (36.9 C), Min:97.8 F (36.6 C), Max:99.1 F (37.3 C)  Recent Labs  Lab 08/30/20 0252 08/30/20 1601 08/31/20 0159 09/01/20 0400 09/02/20 0406 09/03/20 0502  WBC 6.4  --  11.5* 6.9 6.1 5.4  CREATININE 7.14* 7.00* 8.03* 5.52* 6.67* 4.78*    Estimated Creatinine Clearance: 12.6 mL/min (A) (by C-G formula based on SCr of 4.78 mg/dL (H)).    Allergies  Allergen Reactions  . Latex Itching  . Penicillins Other (See Comments)    UNSPECIFIED CHILDHOOD REACTION  Has patient had a PCN reaction causing immediate rash, facial/tongue/throat swelling, SOB or lightheadedness with hypotension: Unknown Has patient had a PCN reaction causing severe rash involving mucus membranes or skin necrosis: Unknown Has patient had a PCN reaction that required hospitalization: Unknown Has patient had a PCN reaction occurring within the last 10 years: Unknown If all of the above answers are "NO", then may proceed with Cephalosporin use.   . Adhesive [Tape] Rash    Antimicrobials this admission: Vanc 2/6 >> Cefepime 2/7 >>  Dose  adjustments this admission:   Microbiology results: 2/6 covid: neg 2/8 MRSA PCR: negative   Thank you for allowing pharmacy to be a part of this patient's care.  Dimple Nanas, PharmD PGY-1 Acute Care Pharmacy Resident Office: [phone] 09/03/2020 7:09 AM

## 2020-09-03 NOTE — Progress Notes (Signed)
[facility] Progress Note   Subjective:   Seen in room, reports she ate grits today and "stomach is rumbling." Denies SOB, CP, palpitations, and nausea at present.   Objective Vitals:   09/02/20 2129 09/02/20 2216 09/03/20 0559 09/03/20 0848  BP: (!) 85/55 (!) 102/48 (!) 91/48   Pulse: 77 83 71 78  Resp: 18  18 18   Temp: 99.1 F (37.3 C)  98.5 F (36.9 C)   TempSrc: Oral  Oral   SpO2: 97%  98% 97%  Weight:      Height:       Physical Exam General:Chronically ill appearing female, alert, in NAD.  Heart:RRR, no murmurs, rubs or gallops Lungs:CTA bilaterally, respirations unlabored Abdomen:Soft, non-distended, hyperactive BS, midline incision with dressing, + ostomy Extremities:No edema b/l lower extremities Dialysis Access:LUE AVF + bruit  Additional Objective Labs: Basic Metabolic Panel: Recent Labs  Lab 08/29/20 0303 08/30/20 0252 08/30/20 1601 09/01/20 0400 09/02/20 0406 09/03/20 0502  NA 139 142   < > 141 137 137  K 4.3 4.0   < > 4.2 3.3* 3.5  CL 99 103   < > 101 99 99  CO2 22 21*   < > 24 22 25   GLUCOSE 72 84   < > 127* 103* 117*  BUN 41* 47*   < > 31* 40* 23  CREATININE 5.74* 7.14*   < > 5.52* 6.67* 4.78*  CALCIUM 7.4* 7.7*   < > 8.1* 7.5* 7.6*  PHOS 6.3* 8.0*  --   --   --   --    < > = values in this interval not displayed.   Liver Function Tests: Recent Labs  Lab 08/27/20 1350 08/28/20 0424 08/29/20 0303 08/30/20 0252  AST 20 17  --   --   ALT 12 11  --   --   ALKPHOS 72 59  --   --   BILITOT 1.1 0.9  --   --   PROT 7.4 6.3*  --   --   ALBUMIN 3.5 2.9* 2.5* 2.3*   Recent Labs  Lab 08/27/20 1350  LIPASE 33   CBC: Recent Labs  Lab 08/30/20 0252 08/30/20 1601 08/31/20 0159 09/01/20 0400 09/02/20 0406 09/03/20 0502  WBC 6.4  --  11.5* 6.9 6.1 5.4  NEUTROABS 4.8  --  10.4* 5.5 4.7 3.7  HGB 9.4*   < > 10.0* 8.1* 7.2* 7.5*  HCT 29.6*   < > 31.3* 25.0* 22.2* 23.4*  MCV 97.7  --  96.3 97.7 96.1 97.5  PLT 128*  --   139* 90* 82* 102*   < > = values in this interval not displayed.   Blood Culture    Component Value Date/Time   SDES  06/28/2020 1615    WOUND LT 3RD TOE Performed at [facility] [address] [place], [place] [address]    [place]  06/28/2020 1615    NONE Performed at [facility] [place] [address] [place], [place] [address]    CULT MODERATE STAPHYLOCOCCUS AUREUS 06/28/2020 1615   REPTSTATUS 07/01/2020 FINAL 06/28/2020 1615   CBG: Recent Labs  Lab 09/02/20 0042 09/02/20 0457 09/02/20 1158 09/02/20 1731 09/02/20 2008  GLUCAP 97 102* 109* 191* 179*   Medications: . sodium chloride 10 mL/hr (08/30/20 1800)  . sodium chloride    . ceFEPime (MAXIPIME) IV 2 g (09/02/20 2025)  . dextrose 50 mL/hr at 08/30/20 1130  . vancomycin Stopped (09/02/20 1104)   .  acetaminophen  1,000 mg Oral Q6H  . Chlorhexidine Gluconate Cloth  6 each Topical Q0600  . doxercalciferol  3 mcg Intravenous Q T,Th,Sa-HD  . fluticasone furoate-vilanterol  1 puff Inhalation Daily  . gabapentin  300 mg Oral TID  . insulin aspart  0-5 Units Subcutaneous QHS  . insulin aspart  0-6 Units Subcutaneous TID WC  . mouth rinse  15 mL Mouth Rinse BID  . methocarbamol  1,000 mg Oral Q8H  . midodrine  5 mg Oral Q T,Th,Sa-HD  . multivitamin  1 tablet Oral QHS  . mupirocin ointment  1 application Nasal BID  . pantoprazole (PROTONIX) IV  40 mg Intravenous Q24H    Outpatient Dialysis Orders: [facility] 3h 12min 400/500 87kg 2/2.25 bath P2 LUA AVF Hep 5000+ 1000 midrun - mircera 100ug last 1/11 - hect 3 ug  Assessment/Plan: 1. ESRD- Continue TTS schedule. Holding heparin with HD post-op 2. Adenocarcinoma of colon with nearly obstructing mass- status postExploratory lap /right hemicolectomy, ileostomy creation 08/30/20-[facility], GIseeingand oncology and palliative care been consulted for new diagnosis of colonic adenocarcinoma 3. Hypertension/volume-BP Soft/stable,uses  midodrine for BPsupport withdialysis. On gentle IV dextrose but still appears euvolemic 4. Renal osteodystrophy -Corrected calcium controlled. Phos has been elevated, takes Tums as her binder (has refused other binders). Continue hectorol with HD. 5. Anemia -Hgb variable, 7.5 today. Stopping mircera with new dx of malignancy. No IV iron with osteomyelitis. Transfuse PRN 6. DMtype 2- peradmit, on IV dextrose 7. PAD -Podiatry tentatively planning for surgery Monday, 2/14 evening if patient is stable. 8. Asthma without exasperation on meds per admit   Anice Paganini, PA-C 09/03/2020, 9:14 AM  [facility] Pager: [phone]

## 2020-09-03 NOTE — Progress Notes (Addendum)
Patient ID: [name], female   DOB: Oct 23, 1950, 70 y.o.   MRN: [id number]  PROGRESS NOTE    [name]  TGG:[id number] DOB: January 29, 1951 DOA: 08/27/2020 PCP: [name] Frizzle, MD   Brief Narrative:  70 year old female history of asthma, anemia, renal cell carcinoma status post nephrectomy, type 2 diabetes mellitus, end-stage renal disease on hemodialysis Tuesday Thursday Saturday, peripheral vascular disease, hypertension, hyperlipidemia, GERD, diabetic retinopathy as well as neuropathy presented to the ED with acute abdominal pain, intractable nausea and vomiting to liquids and solids. Patient noted to have missed hemodialysis on Tuesday and Saturday prior to admission.  Patient also noted to have been scheduled for amputation of the right third toe per podiatry.  In the ED: work-up with CT abdomen and pelvis revealed small and large bowel obstruction secondary to previously demonstrated colonic mass in the ascending colon. NG tube placed.  Nephrology consulted.  GI consulted as well as general surgery.  She underwent colonoscopy on 08/29/2020 which showed likely malignant tumor which was biopsied.  She underwent surgical intervention on 08/30/2020  Assessment & Plan:   Small and large bowel obstruction secondary right ascending colon mass New diagnosis of colonic adenocarcinoma -Status post colonoscopy on 08/29/2020 by GI which showed likely malignant tumor, biopsied, removed for 3 to 8 mm polyps, pathology shows colonic adenocarcinoma. -Status post exploratory laparotomy, right hemicolectomy, ileostomy creation on 08/30/2020.  Wound care/diet as per general surgery recommendations.  NG tube has already been removed. -CT of the chest without contrast showed lingular nodule, slowly enlarging and recommended surveillance -Currently on gentle hydration.   -Oncology evaluation appreciated: Awaiting final pathology from surgery.   -Palliative care has evaluated the patient and patient has been changed to  DNR  End-stage renal disease on hemodialysis -Dialysis as per nephrology schedule.  Nephrology following  Diabetes mellitus type 2 with hyperglycemia and diabetic neuropathy and retinopathy -A1c 7.8.  Continue CBGs with SSI  Bilateral third toes cellulitis/ulcer with chronic osteomyelitis -Followed by podiatry Dr [name] with plans for possible amputation during this admission Continue empiric IV vancomycin and cefepime -Spoke to Dr. Amalia [name] on phone on 08/31/2020 and he is planning for surgical intervention tentatively on Monday if patient remains stable.  Anemia of chronic disease -Hemoglobin stable.  Transfuse if hemoglobin less than 7.  Thrombocytopenia -Questionable cause.  No signs of bleeding  Hyperlipidemia -Resume statin probably upon discharge once medically stable  Asthma without exacerbation -Continue home regimen Breo Ellipta and albuterol inhaler.  Generalized deconditioning -Will need SNF on discharge    DVT prophylaxis: SCDs.  Stopped heparin because of thrombocytopenia  code Status: DNR Family Communication: Spoke to son/Kevin on phone on 09/01/20 Disposition Plan: Status is: Inpatient  Remains inpatient appropriate because:Hemodynamically unstable and Inpatient level of care appropriate due to severity of illness   Dispo: The patient is from: Home              Anticipated d/c is to: SNF              Anticipated d/c date is: > 3 days              Patient currently is not medically stable to d/c.   Difficult to place patient No   Consultants: GI/general surgery/nephrology/palliative care  Procedures: Colonoscopy on 08/29/2020 Right hemicolectomy on 08/30/2020  Antimicrobials: Vancomycin and cefepime from 08/27/2020 onwards   Subjective: Patient seen and examined at bedside.  No overnight fever, vomiting, increasing abdominal pain reported.   Objective: Vitals:  09/02/20 1700 09/02/20 2129 09/02/20 2216 09/03/20 0559  BP: 104/60 (!) 85/55 (!) 102/48 (!)  91/48  Pulse: 84 77 83 71  Resp: 17 18  18   Temp: 98.3 F (36.8 C) 99.1 F (37.3 C)  98.5 F (36.9 C)  TempSrc: Oral Oral  Oral  SpO2: 98% 97%  98%  Weight:      Height:        Intake/Output Summary (Last 24 hours) at 09/03/2020 0750 Last data filed at 09/03/2020 0600 Gross per 24 hour  Intake 940 ml  Output 1825 ml  Net -885 ml   Filed Weights   08/31/20 2041 09/02/20 0830 09/02/20 1105  Weight: 88.9 kg 88.9 kg 87.5 kg    Examination:  General exam: Currently on 2 L oxygen via nasal cannula.  Chronically ill looking.  Poor historian.  Respiratory system: Bilateral decreased breath sounds at bases with scattered crackles, no wheezing  cardiovascular system: Regular short, rate controlled Gastrointestinal system: Abdomen is slightly distended, soft and dressing present with surrounding tenderness.  Ostomy bag present.  extremities: No clubbing.  Mild lower extremity edema present Central nervous system: Awake, still very slow to respond to questions.  No focal neurological deficits.  Moving extremities skin: No obvious edema/lesions psychiatry: Flat Affect  Data Reviewed: I have personally reviewed following labs and imaging studies  CBC: Recent Labs  Lab 08/30/20 0252 08/30/20 1601 08/31/20 0159 09/01/20 0400 09/02/20 0406 09/03/20 0502  WBC 6.4  --  11.5* 6.9 6.1 5.4  NEUTROABS 4.8  --  10.4* 5.5 4.7 3.7  HGB 9.4* 8.2* 10.0* 8.1* 7.2* 7.5*  HCT 29.6* 24.0* 31.3* 25.0* 22.2* 23.4*  MCV 97.7  --  96.3 97.7 96.1 97.5  PLT 128*  --  139* 90* 82* 937*   Basic Metabolic Panel: Recent Labs  Lab 08/29/20 0302 08/29/20 0303 08/30/20 0252 08/30/20 1601 08/31/20 0159 09/01/20 0400 09/02/20 0406 09/03/20 0502  NA  --  139 142 139 141 141 137 137  K  --  4.3 4.0 4.1 5.2* 4.2 3.3* 3.5  CL  --  99 103 103 104 101 99 99  CO2  --  22 21*  --  15* 24 22 25   GLUCOSE  --  72 84 114* 224* 127* 103* 117*  BUN  --  41* 47* 50* 56* 31* 40* 23  CREATININE  --  5.74* 7.14*  7.00* 8.03* 5.52* 6.67* 4.78*  CALCIUM  --  7.4* 7.7*  --  7.5* 8.1* 7.5* 7.6*  MG 1.8  --   --   --   --  2.0  --   --   PHOS  --  6.3* 8.0*  --   --   --   --   --    GFR: Estimated Creatinine Clearance: 12.6 mL/min (A) (by C-G formula based on SCr of 4.78 mg/dL (H)). Liver Function Tests: Recent Labs  Lab 08/27/20 1350 08/28/20 0424 08/29/20 0303 08/30/20 0252  AST 20 17  --   --   ALT 12 11  --   --   ALKPHOS 72 59  --   --   BILITOT 1.1 0.9  --   --   PROT 7.4 6.3*  --   --   ALBUMIN 3.5 2.9* 2.5* 2.3*   Recent Labs  Lab 08/27/20 1350  LIPASE 33   No results for input(s): AMMONIA in the last 168 hours. Coagulation Profile: No results for input(s): INR, PROTIME in the last 168 hours.  Cardiac Enzymes: No results for input(s): CKTOTAL, CKMB, CKMBINDEX, TROPONINI in the last 168 hours. BNP (last 3 results) No results for input(s): PROBNP in the last 8760 hours. HbA1C: No results for input(s): HGBA1C in the last 72 hours. CBG: Recent Labs  Lab 09/02/20 0042 09/02/20 0457 09/02/20 1158 09/02/20 1731 09/02/20 2008  GLUCAP 97 102* 109* 191* 179*   Lipid Profile: No results for input(s): CHOL, HDL, LDLCALC, TRIG, CHOLHDL, LDLDIRECT in the last 72 hours. Thyroid Function Tests: No results for input(s): TSH, T4TOTAL, FREET4, T3FREE, THYROIDAB in the last 72 hours. Anemia Panel: No results for input(s): VITAMINB12, FOLATE, FERRITIN, TIBC, IRON, RETICCTPCT in the last 72 hours. Sepsis Labs: No results for input(s): PROCALCITON, LATICACIDVEN in the last 168 hours.  Recent Results (from the past 240 hour(s))  SARS Coronavirus 2 by RT PCR (hospital order, performed in [facility] hospital lab) Nasopharyngeal Nasopharyngeal Swab     Status: None   Collection Time: 08/27/20  9:46 PM   Specimen: Nasopharyngeal Swab  Result Value Ref Range Status   SARS Coronavirus 2 NEGATIVE NEGATIVE Final    Comment: (NOTE) SARS-CoV-2 target nucleic acids are NOT DETECTED.  The  SARS-CoV-2 RNA is generally detectable in upper and lower respiratory specimens during the acute phase of infection. The lowest concentration of SARS-CoV-2 viral copies this assay can detect is 250 copies / mL. A negative result does not preclude SARS-CoV-2 infection and should not be used as the sole basis for treatment or other patient management decisions.  A negative result may occur with improper specimen collection / handling, submission of specimen other than nasopharyngeal swab, presence of viral mutation(s) within the areas targeted by this assay, and inadequate number of viral copies (<250 copies / mL). A negative result must be combined with clinical observations, patient history, and epidemiological information.  Fact Sheet for Patients:   StrictlyIdeas.no  Fact Sheet for Healthcare Providers: BankingDealers.co.za  This test is not yet approved or  cleared by the [place] FDA and has been authorized for detection and/or diagnosis of SARS-CoV-2 by FDA under an Emergency Use Authorization (EUA).  This EUA will remain in effect (meaning this test can be used) for the duration of the COVID-19 declaration under Section 564(b)(1) of the Act, 21 U.S.C. section 360bbb-3(b)(1), unless the authorization is terminated or revoked sooner.  Performed at [facility], [place] [address], [place], [place] [address]   Surgical PCR screen     Status: None   Collection Time: 08/29/20 10:13 PM   Specimen: Nasal Mucosa; Nasal Swab  Result Value Ref Range Status   MRSA, PCR NEGATIVE NEGATIVE Final   Staphylococcus aureus NEGATIVE NEGATIVE Final    Comment: (NOTE) The Xpert SA Assay (FDA approved for NASAL specimens in patients 56 years of age and older), is one component of a comprehensive surveillance program. It is not intended to diagnose infection nor to guide or monitor treatment. Performed at [place], [place] [address], [place], [place] [address]          Radiology Studies: No results found.      Scheduled Meds: . acetaminophen  1,000 mg Oral Q6H  . Chlorhexidine Gluconate Cloth  6 each Topical Q0600  . doxercalciferol  3 mcg Intravenous Q T,Th,Sa-HD  . fluticasone furoate-vilanterol  1 puff Inhalation Daily  . gabapentin  300 mg Oral TID  . insulin aspart  0-15 Units Subcutaneous Q4H  . mouth rinse  15 mL Mouth Rinse BID  .  methocarbamol  1,000 mg Oral Q8H  . midodrine  5 mg Oral Q T,Th,Sa-HD  . multivitamin  1 tablet Oral QHS  . mupirocin ointment  1 application Nasal BID  . pantoprazole (PROTONIX) IV  40 mg Intravenous Q24H   Continuous Infusions: . sodium chloride 10 mL/hr (08/30/20 1800)  . sodium chloride    . ceFEPime (MAXIPIME) IV 2 g (09/02/20 2025)  . dextrose 50 mL/hr at 08/30/20 1130  . vancomycin Stopped (09/02/20 1104)          Aline August, MD [facility] 09/03/2020, 7:50 AM

## 2020-09-03 NOTE — Progress Notes (Signed)
4 Days Post-Op    CC: Abdominal pain/emesis  Subjective: She did well with the full liquids I advance her to a renal diet.  I can see where she has had any teaching with her colostomy.  She is not very anxious to do this either.  She does want to get home. Her ostomy is working well she is tolerating full liquids well. Objective: Vital signs in last 24 hours: Temp:  [98 F (36.7 C)-99.1 F (37.3 C)] 98.5 F (36.9 C) (02/13 0559) Pulse Rate:  [71-85] 78 (02/13 0848) Resp:  [16-18] 18 (02/13 0848) BP: (85-104)/(48-60) 91/48 (02/13 0559) SpO2:  [97 %-98 %] 97 % (02/13 0848) FiO2 (%):  [28 %] 28 % (02/13 0848) Weight:  [87.5 kg] 87.5 kg (02/12 1105) Last BM Date: 09/02/20  840 p.o. 100 IV 1500 per dialysis 325 stool Afebrile vital signs are stable, blood pressure down to 91/48 this a.m. 0300 hrs. sats 9798% on nasal cannula at night. Potassium 3.5, creatinine 4.78, WBC 5.4, H/H 7.5/23.4, platelets 102,000.  Intake/Output from previous day: 02/12 0701 - 02/13 0700 In: 940 [P.O.:840; IV Piggyback:100] Out: 1825 [Stool:325] Intake/Output this shift: Total I/O In: 240 [P.O.:240] Out: -   General appearance: alert, cooperative and no distress Resp: clear to auscultation bilaterally GI: Soft, midline incisions okay.  Ostomy is working well.  She is tolerating full liquids.  Lab Results:  Recent Labs    09/02/20 0406 09/03/20 0502  WBC 6.1 5.4  HGB 7.2* 7.5*  HCT 22.2* 23.4*  PLT 82* 102*    BMET Recent Labs    09/02/20 0406 09/03/20 0502  NA 137 137  K 3.3* 3.5  CL 99 99  CO2 22 25  GLUCOSE 103* 117*  BUN 40* 23  CREATININE 6.67* 4.78*  CALCIUM 7.5* 7.6*   PT/INR No results for input(s): LABPROT, INR in the last 72 hours.  Recent Labs  Lab 08/27/20 1350 08/28/20 0424 08/29/20 0303 08/30/20 0252  AST 20 17  --   --   ALT 12 11  --   --   ALKPHOS 72 59  --   --   BILITOT 1.1 0.9  --   --   PROT 7.4 6.3*  --   --   ALBUMIN 3.5 2.9* 2.5* 2.3*      Lipase     Component Value Date/Time   LIPASE 33 08/27/2020 1350     Medications: . acetaminophen  1,000 mg Oral Q6H  . Chlorhexidine Gluconate Cloth  6 each Topical Q0600  . doxercalciferol  3 mcg Intravenous Q T,Th,Sa-HD  . fluticasone furoate-vilanterol  1 puff Inhalation Daily  . gabapentin  300 mg Oral TID  . insulin aspart  0-5 Units Subcutaneous QHS  . insulin aspart  0-6 Units Subcutaneous TID WC  . mouth rinse  15 mL Mouth Rinse BID  . methocarbamol  1,000 mg Oral Q8H  . midodrine  5 mg Oral Q T,Th,Sa-HD  . multivitamin  1 tablet Oral QHS  . mupirocin ointment  1 application Nasal BID  . pantoprazole (PROTONIX) IV  40 mg Intravenous Q24H    Assessment/Plan ESRD on HD TTS- hyperkalemia on todays labs (K 5.2) Hx renal cell carcinoma; s/p partial right nephrectomy adrenalectomy 2018 Type 2 diabetes; diabetic neuropathy, diabetic retinopathy. 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 Osteo of b/l third LE digits - podiatry following GERD Hyperlipidemia Hx asthma Protein Calorie Malnutrition - Pre-alb 11.4.Nutrition consult when tolerating diet. If develops prolonged ileus, may need TPN.  Anemia  - H/H 9.4/29.6>>8.2/24>>10/31.3>>8.1/25>>7.5/23.4 Thrombocytopenia Platelets 82K>>102K   Adenocarcinomaof the colon w/ nearly obstructing mass S/p exploratory laparotomy,right hemicolectomy, ileostomy creation- Dr. Bobbye Morton - 08/30/2020 POD #4 - Path from colonoscopy reveals colon mass bx w/ poorly differentiated adenocarcinoma with signet ring features. Polypectomy w/ tubular adenoma w/ no high grade dysplasia or carcinoma.  - CEA 3.7 - Surgical path pending.  -CT of the chest without contrast showed lingular nodule, slowly enlargingover the last 2 years.  - CT of the A/P w/few mildly enlarged mesenteric lymph nodes in the right lower quadrant and right mid abdomen, concerning for nodal metastatic disease. - It appears thatoncology and palliative  carehave already been consultedfor new diagnosis of colonic adenocarcinoma - Await return of bowel function. Keep NPO and NGT to LIWS. If does not have ROBF in a few days, will order TPN - Mobilize - PT consult - Pulm toilet - WOCN following for new ostomy  FEN -NPO, NGT, IVF per TRH>> clamping NG and checking later hope to remove, clear liquids VTE -SCDs, Heparin subq ID -Vanc and Cefepime for osteo (per podiatry). Received clinda and gent periop.  Follow-Up- Dr. Bobbye Morton  10/05/2020 at 10:30 AM  Plan: Advance her to a renal diet.  She needs wound ostomy teaching, she has a follow-up appointment with Dr. Bobbye Morton, but we also need to set her up to have her staples removed after discharge from the hospital.  LOS: 7 days    Dung Salinger 09/03/2020 Please see Amion

## 2020-09-04 ENCOUNTER — Inpatient Hospital Stay (HOSPITAL_COMMUNITY): Payer: Medicare Other | Admitting: Certified Registered Nurse Anesthetist

## 2020-09-04 ENCOUNTER — Encounter (HOSPITAL_COMMUNITY)
Admission: EM | Disposition: A | Discharge status: Skilled Nursing Facility | Payer: Self-pay | Source: Home / Self Care | Attending: Internal Medicine

## 2020-09-04 ENCOUNTER — Encounter (HOSPITAL_COMMUNITY): Payer: Self-pay | Admitting: Family Medicine

## 2020-09-04 ENCOUNTER — Ambulatory Visit (HOSPITAL_COMMUNITY): Admission: RE | Admit: 2020-09-04 | Payer: Medicare Other | Source: Ambulatory Visit

## 2020-09-04 DIAGNOSIS — M86672 Other chronic osteomyelitis, left ankle and foot: Secondary | ICD-10-CM

## 2020-09-04 DIAGNOSIS — Z992 Dependence on renal dialysis: Secondary | ICD-10-CM | POA: Diagnosis not present

## 2020-09-04 DIAGNOSIS — M86671 Other chronic osteomyelitis, right ankle and foot: Secondary | ICD-10-CM

## 2020-09-04 DIAGNOSIS — K56600 Partial intestinal obstruction, unspecified as to cause: Secondary | ICD-10-CM | POA: Diagnosis not present

## 2020-09-04 DIAGNOSIS — N186 End stage renal disease: Secondary | ICD-10-CM | POA: Diagnosis not present

## 2020-09-04 DIAGNOSIS — C182 Malignant neoplasm of ascending colon: Secondary | ICD-10-CM | POA: Diagnosis not present

## 2020-09-04 HISTORY — PX: AMPUTATION TOE: SHX6595

## 2020-09-04 HISTORY — PX: TOE ARTHROPLASTY: SHX6504

## 2020-09-04 LAB — GLUCOSE, CAPILLARY
Glucose-Capillary: 114 mg/dL — ABNORMAL HIGH (ref 70–99)
Glucose-Capillary: 155 mg/dL — ABNORMAL HIGH (ref 70–99)
Glucose-Capillary: 141 mg/dL — ABNORMAL HIGH (ref 70–99)
Glucose-Capillary: 124 mg/dL — ABNORMAL HIGH (ref 70–99)
Glucose-Capillary: 165 mg/dL — ABNORMAL HIGH (ref 70–99)

## 2020-09-04 LAB — BASIC METABOLIC PANEL
Anion gap: 15 (ref 5–15)
BUN: 35 mg/dL — ABNORMAL HIGH (ref 8–23)
CO2: 23 mmol/L (ref 22–32)
Calcium: 7.6 mg/dL — ABNORMAL LOW (ref 8.9–10.3)
Chloride: 94 mmol/L — ABNORMAL LOW (ref 98–111)
Creatinine, Ser: 6.94 mg/dL — ABNORMAL HIGH (ref 0.44–1.00)
GFR, Estimated: 6 mL/min — ABNORMAL LOW (ref >60)
Glucose, Bld: 136 mg/dL — ABNORMAL HIGH (ref 70–99)
Potassium: 3.7 mmol/L (ref 3.5–5.1)
Sodium: 132 mmol/L — ABNORMAL LOW (ref 135–145)

## 2020-09-04 LAB — SURGICAL PATHOLOGY

## 2020-09-04 SURGERY — AMPUTATION, TOE
Anesthesia: Monitor Anesthesia Care | Site: Toe | Laterality: Bilateral

## 2020-09-04 MED ORDER — BUPIVACAINE HCL (PF) 0.5 % IJ SOLN
INTRAMUSCULAR | Status: AC
  Filled 2020-09-04: qty 30

## 2020-09-04 MED ORDER — LIDOCAINE HCL 2 % IJ SOLN
INTRAMUSCULAR | Status: DC | PRN
  Administered 2020-09-04: 10 mL

## 2020-09-04 MED ORDER — ONDANSETRON HCL 4 MG/2ML IJ SOLN: 4.0000 mg | Freq: Four times a day (QID) | INTRAMUSCULAR | Status: DC | PRN

## 2020-09-04 MED ORDER — PROPOFOL 500 MG/50ML IV EMUL
INTRAVENOUS | Status: DC | PRN
  Administered 2020-09-04: 75 ug/kg/min via INTRAVENOUS

## 2020-09-04 MED ORDER — PROPOFOL 10 MG/ML IV BOLUS
INTRAVENOUS | Status: AC
  Filled 2020-09-04: qty 20

## 2020-09-04 MED ORDER — 0.9 % SODIUM CHLORIDE (POUR BTL) OPTIME
TOPICAL | Status: DC | PRN
  Administered 2020-09-04: 1000 mL

## 2020-09-04 MED ORDER — LIDOCAINE HCL 2 % IJ SOLN
INTRAMUSCULAR | Status: AC
  Filled 2020-09-04: qty 20

## 2020-09-04 MED ORDER — PROPOFOL 10 MG/ML IV BOLUS: INTRAVENOUS | Status: DC | PRN

## 2020-09-04 MED ORDER — BUPIVACAINE HCL (PF) 0.5 % IJ SOLN
INTRAMUSCULAR | Status: DC | PRN
  Administered 2020-09-04: 10 mL

## 2020-09-04 MED ORDER — LIDOCAINE 2% (20 MG/ML) 5 ML SYRINGE
INTRAMUSCULAR | Status: DC | PRN
  Administered 2020-09-04: 40 mg via INTRAVENOUS

## 2020-09-04 MED ORDER — OXYCODONE HCL 5 MG PO TABS: 5.0000 mg | ORAL_TABLET | Freq: Once | ORAL | Status: DC | PRN

## 2020-09-04 MED ORDER — OXYCODONE HCL 5 MG/5ML PO SOLN: 5.0000 mg | Freq: Once | ORAL | Status: DC | PRN

## 2020-09-04 MED ORDER — SODIUM CHLORIDE 0.9 % IV SOLN: INTRAVENOUS | Status: DC

## 2020-09-04 MED ORDER — FENTANYL CITRATE (PF) 100 MCG/2ML IJ SOLN: 25.0000 ug | INTRAMUSCULAR | Status: DC | PRN

## 2020-09-04 SURGICAL SUPPLY — 48 items
APL PRP STRL LF DISP 70% ISPRP (MISCELLANEOUS) ×4
BLADE OSCILLATING/SAGITTAL (BLADE) ×3
BLADE SURG 15 STRL LF DISP TIS (BLADE) IMPLANT
BLADE SURG 15 STRL SS (BLADE) ×3
BLADE SW THK.38XMED NAR THN (BLADE) IMPLANT
BNDG CMPR 9X4 STRL LF SNTH (GAUZE/BANDAGES/DRESSINGS) ×2
BNDG ELASTIC 4X5.8 VLCR STR LF (GAUZE/BANDAGES/DRESSINGS) ×2 IMPLANT
BNDG ESMARK 4X9 LF (GAUZE/BANDAGES/DRESSINGS) ×1 IMPLANT
BNDG GAUZE ELAST 4 BULKY (GAUZE/BANDAGES/DRESSINGS) ×4 IMPLANT
CHLORAPREP W/TINT 26 (MISCELLANEOUS) ×2 IMPLANT
COVER SURGICAL LIGHT HANDLE (MISCELLANEOUS) ×3 IMPLANT
COVER WAND RF STERILE (DRAPES) ×2 IMPLANT
CUFF TOURN SGL QUICK 18X4 (TOURNIQUET CUFF) ×4 IMPLANT
CUFF TOURN SGL QUICK 24 (TOURNIQUET CUFF)
CUFF TRNQT CYL 24X4X16.5-23 (TOURNIQUET CUFF) IMPLANT
DRSG PAD ABDOMINAL 8X10 ST (GAUZE/BANDAGES/DRESSINGS) ×2 IMPLANT
DRSG XEROFORM 1X8 (GAUZE/BANDAGES/DRESSINGS) ×1 IMPLANT
ELECT REM PT RETURN 9FT ADLT (ELECTROSURGICAL)
ELECTRODE REM PT RTRN 9FT ADLT (ELECTROSURGICAL) IMPLANT
GAUZE SPONGE 4X4 12PLY STRL (GAUZE/BANDAGES/DRESSINGS) ×4 IMPLANT
GAUZE XEROFORM 1X8 LF (GAUZE/BANDAGES/DRESSINGS) ×2 IMPLANT
GLOVE BIO SURGEON STRL SZ8 (GLOVE) ×3 IMPLANT
GLOVE SRG 8 PF TXTR STRL LF DI (GLOVE) ×2 IMPLANT
GLOVE SURG UNDER POLY LF SZ8 (GLOVE) ×3
GOWN STRL REUS W/ TWL LRG LVL3 (GOWN DISPOSABLE) ×4 IMPLANT
GOWN STRL REUS W/TWL LRG LVL3 (GOWN DISPOSABLE) ×6
KIT BASIN OR (CUSTOM PROCEDURE TRAY) ×3 IMPLANT
KIT TURNOVER KIT B (KITS) ×3 IMPLANT
NDL 18GX1X1/2 (RX/OR ONLY) (NEEDLE) IMPLANT
NDL PRECISIONGLIDE 27X1.5 (NEEDLE) ×2 IMPLANT
NEEDLE 18GX1X1/2 (RX/OR ONLY) (NEEDLE) ×3 IMPLANT
NEEDLE PRECISIONGLIDE 27X1.5 (NEEDLE) ×6 IMPLANT
NS IRRIG 1000ML POUR BTL (IV SOLUTION) ×3 IMPLANT
PACK ORTHO EXTREMITY (CUSTOM PROCEDURE TRAY) ×3 IMPLANT
PAD ARMBOARD 7.5X6 YLW CONV (MISCELLANEOUS) ×6 IMPLANT
PAD CAST 4YDX4 CTTN HI CHSV (CAST SUPPLIES) ×2 IMPLANT
PADDING CAST COTTON 4X4 STRL (CAST SUPPLIES)
SOL PREP POV-IOD 4OZ 10% (MISCELLANEOUS) ×3 IMPLANT
STAPLER VISISTAT 35W (STAPLE) IMPLANT
STOCKINETTE 4X48 STRL (DRAPES) ×1 IMPLANT
SUT PROLENE 4 0 PS 2 18 (SUTURE) ×2 IMPLANT
SUT VIC AB 3-0 PS2 18 (SUTURE) IMPLANT
SUT VICRYL 4-0 PS2 18IN ABS (SUTURE) ×1 IMPLANT
SYR CONTROL 10ML LL (SYRINGE) ×4 IMPLANT
TOWEL GREEN STERILE (TOWEL DISPOSABLE) ×3 IMPLANT
TOWEL GREEN STERILE FF (TOWEL DISPOSABLE) ×3 IMPLANT
TUBE CONNECTING 12X1/4 (SUCTIONS) ×3 IMPLANT
YANKAUER SUCT BULB TIP NO VENT (SUCTIONS) ×1 IMPLANT

## 2020-09-04 NOTE — Interval H&P Note (Signed)
History and Physical Interval Note:  09/04/2020 5:23 PM  [name]  has presented today for surgery, with the diagnosis of OSTEOMYLITIS.  The various methods of treatment have been discussed with the patient and family. After consideration of risks, benefits and other options for treatment, the patient has consented to  Procedure(s): AMPUTATION THIRD TOE (Bilateral) as a surgical intervention.  The patient's history has been reviewed, patient examined, no change in status, stable for surgery.  I have reviewed the patient's chart and labs.  Questions were answered to the patient's satisfaction.     Edrick Kins

## 2020-09-04 NOTE — Progress Notes (Signed)
This chaplain responded to PMT consult for spiritual care.  The Pt. is awake and conversational in the bedside recliner.  The chaplain is present and practicing reflective listening as the Pt. describes herself and the meaning in her life.  The Pt. trusts  her sister to care for her animals as she navigates today's foot surgery and the new cancer diagnosis.    The chaplain understands the Pt will take whatever life gives her and make something good out of it.  The Pt approaches her faith with curiosity and is exploring which tradition may be the best fit for her.    The Pt. is open to the chaplain's intercessory prayer and a F/U visit.

## 2020-09-04 NOTE — H&P (View-Only) (Signed)
    PODIATRY PROGRESS NOTE  Mylin Gignac MRN [id number] DOB1952-11-06 DOA 08/27/2020  PODIATRY CC: Ulcers/osteomyelitis bilateral third digits.   HPI:  Patient seen this morning for surgical preop regarding the osteomyelitis to the bilateral third digits.  Patient stable to proceed with surgery.  She is resting comfortably in bed this morning.   Physical examination: General: The patient is alert and oriented x3 in no acute distress.  Dermatology: Open wounds bilateral third digits with exposed bone right foot.  Findings consistent with osteomyelitis; chronic and somewhat stable.  No malodor noted.  Associated edema noted to the digits.   Neurovascular status intact.  Diminished sensation bilateral consistent with peripheral polyneuropathy  Musculoskeletal Exam: Prior history of bilateral first and second digit amputations.    Assessment/Plan of Care:  -Osteomyelitis/ulcers bilateral third digits               -Patient stable.  Surgical plan was discussed with the patient.  Preoperative orders placed today.  Surgery will consist of right third digit amputation.  Arthroplasty third digit left foot.  -Continue IV antibiotics as per admitting hospitalist  -N.p.o. after breakfast.  Surgery will be after 5 PM.  *Please contact me directly with any questions or concerns. [phone] cell    Edrick Kins, DPM [facility]  Dr. Edrick Kins, DPM    [address], [place] [address]                Office [phone]  Fax [phone]

## 2020-09-04 NOTE — Evaluation (Signed)
Occupational Therapy Evaluation Patient Details Name: [name] MRN: [id number] DOB: 1951-05-29 Today's Date: 09/04/2020    History of Present Illness Pt is a 70 y.o. female admitted 08/27/20 with nausea/vomiting and malaise, missed HD due to feeling poorly and winter weather. CT with small and large bowel obstruction secondary to ascending mass in RLQ, concerning for metastatic process. S/p hemicolectomy, ileostomy 2/9. 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 PMH includes CA, CKD, DM, HTN, toe amputations.   Clinical Impression   PTA, pt lives alone and reports Independence with ADLs, IADLs (though becoming increasingly difficult to manage) with intermittent use of RW/cane in the community. Pt presents with deficits in cognition, strength, and standing balance. Pt requires up to Mod A for bed mobility and sit to stand transfers at bedside with RW. Pt able to take side steps at bedside with Min A requiring cues for sequencing and hands on assist for balance. Pt Setup for UB ADLs sitting EOB with fair balance and requires up to Mod A for LB ADLs. Noted pt with planned B toe amputations later today. Plan to further assess functional abilities, WB status s/p surgery but anticipate pt to require short term stay at SNF prior to discharge home.     Follow Up Recommendations  SNF    Equipment Recommendations  3 in 1 bedside commode;Other (comment) (RW)    Recommendations for Other Services       Precautions / Restrictions Precautions Precautions: Fall;Other (comment) Precaution Comments: NGT, osteomy R LQ, toe wounds (plan for amputations 2/14), abdominal incision Restrictions Weight Bearing Restrictions: No      Mobility Bed Mobility Overal bed mobility: Needs Assistance Bed Mobility: Supine to Sit;Sit to Supine     Supine to sit: Min guard;HOB elevated Sit to supine: Mod assist   General bed mobility comments: min guard with increased  time to sit EOB using bed rails. Mod A for B LE back into bed due to difficulty sequencing    Transfers Overall transfer level: Needs assistance Equipment used: Rolling walker (2 wheeled) Transfers: Sit to/from Stand Sit to Stand: Mod assist         General transfer comment: Mod A for sit to stand at bedside with RW, able to lift hips but increased assist needed to obtain upright positioning, Min A for side steps at bedside with RW    Balance Overall balance assessment: Needs assistance Sitting-balance support: Feet supported;Bilateral upper extremity supported Sitting balance-Leahy Scale: Fair     Standing balance support: Bilateral upper extremity supported;During functional activity Standing balance-Leahy Scale: Poor Standing balance comment: reliant on UE support in standing                           ADL either performed or assessed with clinical judgement   ADL Overall ADL's : Needs assistance/impaired Eating/Feeding: Set up;Sitting   Grooming: Set up;Sitting;Brushing hair Grooming Details (indicate cue type and reason): Setup to brush hair sitting EOB Upper Body Bathing: Set up;Sitting   Lower Body Bathing: Sit to/from stand;Sitting/lateral leans;Moderate assistance   Upper Body Dressing : Set up;Sitting   Lower Body Dressing: Moderate assistance;Sitting/lateral leans;Sit to/from stand   Toilet Transfer: Moderate assistance;Stand-pivot;RW Toilet Transfer Details (indicate cue type and reason): simulated at bedside Toileting- Clothing Manipulation and Hygiene: Moderate assistance;Sit to/from stand;Sitting/lateral lean         General ADL Comments: Limited by cognition and balance deficits impacting safety during ADLs. Educated  on continued monitoring due to pending amputations and assesment of WB status given     Vision Baseline Vision/History: Wears glasses Wears Glasses: Reading only Patient Visual Report: No change from baseline;Other (comment)  (hx of cataract surgery) Vision Assessment?: No apparent visual deficits     Perception     Praxis      Pertinent Vitals/Pain Pain Assessment: Faces Faces Pain Scale: Hurts little more Pain Location: abdominal incision Pain Descriptors / Indicators: Grimacing;Sore Pain Intervention(s): Monitored during session;Repositioned     Hand Dominance Right   Extremity/Trunk Assessment Upper Extremity Assessment Upper Extremity Assessment: Generalized weakness   Lower Extremity Assessment Lower Extremity Assessment: Defer to PT evaluation   Cervical / Trunk Assessment Cervical / Trunk Assessment: Kyphotic   Communication Communication Communication: No difficulties   Cognition Arousal/Alertness: Awake/alert Behavior During Therapy: Flat affect Overall Cognitive Status: No family/caregiver present to determine baseline cognitive functioning Area of Impairment: Orientation;Attention;Memory;Following commands;Safety/judgement;Awareness;Problem solving                 Orientation Level: Disoriented to;Situation Current Attention Level: Sustained Memory: Decreased short-term memory;Decreased recall of precautions Following Commands: Follows one step commands with increased time;Follows multi-step commands inconsistently Safety/Judgement: Decreased awareness of safety;Decreased awareness of deficits Awareness: Intellectual Problem Solving: Slow processing;Decreased initiation;Difficulty sequencing;Requires verbal cues;Requires tactile cues General Comments: Pt alert and participatory, some attention deficits noted (tangential and cues to attend to tasks), slower processing in following directions. Some decreased awareness of deficits, dificulty completing tasks.   General Comments  VSS on 2 L O2    Exercises     Shoulder Instructions      Home Living Family/patient expects to be discharged to:: Private residence Living Arrangements: Alone Available Help at Discharge:  Family;Friend(s);Available PRN/intermittently Type of Home: House Home Access: Level entry     Home Layout: One level     Bathroom Shower/Tub: Teacher, early years/pre: Standard Bathroom Accessibility: Yes   Home Equipment: Walker - 4 wheels;Cane - single point          Prior Functioning/Environment Level of Independence: Needs assistance  Gait / Transfers Assistance Needed: Reports furniture walking inside the home (reports clutter and difficulty using AD in home). Uses RW out in community, cane to dialysis ADL's / Homemaking Assistance Needed: Pt reports recently having license taken away - sister assists with transportation for appts/grocery shopping. Pt reports independent with ADLs, IADLs in the home (though difficulty with laundry, etc) and care for small dogs            OT Problem List: Decreased strength;Decreased activity tolerance;Impaired balance (sitting and/or standing);Decreased cognition;Decreased safety awareness;Decreased knowledge of use of DME or AE      OT Treatment/Interventions: Self-care/ADL training;Therapeutic exercise;Energy conservation;DME and/or AE instruction;Therapeutic activities;Patient/family education;Balance training    OT Goals(Current goals can be found in the care plan section) Acute Rehab OT Goals Patient Stated Goal: get back home to dogs OT Goal Formulation: With patient Time For Goal Achievement: 09/18/20 Potential to Achieve Goals: Good ADL Goals Pt Will Perform Lower Body Bathing: with set-up;sitting/lateral leans;sit to/from stand Pt Will Perform Lower Body Dressing: with set-up;sitting/lateral leans;sit to/from stand Pt Will Transfer to Toilet: with set-up;bedside commode;stand pivot transfer Pt Will Perform Toileting - Clothing Manipulation and hygiene: with set-up;sitting/lateral leans;sit to/from stand Pt/caregiver will Perform Home Exercise Program: Increased strength;Both right and left upper extremity;With  theraband;With Supervision;With written HEP provided  OT Frequency: Min 2X/week   Barriers to D/C:  Co-evaluation              AM-PAC OT "6 Clicks" Daily Activity     Outcome Measure Help from another person eating meals?: A Little Help from another person taking care of personal grooming?: A Little Help from another person toileting, which includes using toliet, bedpan, or urinal?: A Lot Help from another person bathing (including washing, rinsing, drying)?: A Lot Help from another person to put on and taking off regular upper body clothing?: A Little Help from another person to put on and taking off regular lower body clothing?: A Lot 6 Click Score: 15   End of Session Equipment Utilized During Treatment: Gait belt;Rolling walker Nurse Communication: Mobility status  Activity Tolerance: Patient tolerated treatment well Patient left: in bed;with call bell/phone within reach;with bed alarm set  OT Visit Diagnosis: Unsteadiness on feet (R26.81);Other abnormalities of gait and mobility (R26.89);Muscle weakness (generalized) (M62.81);Other symptoms and signs involving cognitive function                Time: 4193-7902 OT Time Calculation (min): 34 min Charges:  OT General Charges $OT Visit: 1 Visit OT Evaluation $OT Eval Moderate Complexity: 1 Mod OT Treatments $Self Care/Home Management : 8-22 mins  Layla Maw, OTR/L  Layla Maw 09/04/2020, 8:21 AM

## 2020-09-04 NOTE — Plan of Care (Signed)
  Problem: Clinical Measurements: Goal: Diagnostic test results will improve Outcome: Progressing   Problem: Pain Managment: Goal: General experience of comfort will improve Outcome: Progressing   Problem: Fluid Volume: Goal: Compliance with measures to maintain balanced fluid volume will improve Outcome: Progressing

## 2020-09-04 NOTE — Progress Notes (Signed)
Patient ID: KYMBERLY BLOMBERG, female   DOB: [birth year], 70 y.o.   MRN: [id number]  PROGRESS NOTE    TANSY LOREK  RXV:[id number] DOB: Aug 15, 1950 DOA: 08/27/2020 PCP: [name], MD   Brief Narrative:  70 year old female history of asthma, anemia, renal cell carcinoma status post nephrectomy, type 2 diabetes mellitus, end-stage renal disease on hemodialysis Tuesday Thursday Saturday, peripheral vascular disease, hypertension, hyperlipidemia, GERD, diabetic retinopathy as well as neuropathy presented to the ED with acute abdominal pain, intractable nausea and vomiting to liquids and solids. Patient noted to have missed hemodialysis on Tuesday and Saturday prior to admission.  Patient also noted to have been scheduled for amputation of the right third toe per podiatry.  In the ED: work-up with CT abdomen and pelvis revealed small and large bowel obstruction secondary to previously demonstrated colonic mass in the ascending colon. NG tube placed.  Nephrology consulted.  GI consulted as well as general surgery.  She underwent colonoscopy on 08/29/2020 which showed likely malignant tumor which was biopsied.  She underwent surgical intervention on 08/30/2020  Assessment & Plan:   Small and large bowel obstruction secondary right ascending colon mass New diagnosis of colonic adenocarcinoma -Status post colonoscopy on 08/29/2020 by GI which showed likely malignant tumor, biopsied, removed for 3 to 8 mm polyps, pathology shows colonic adenocarcinoma. -Status post exploratory laparotomy, right hemicolectomy, ileostomy creation on 08/30/2020.  Wound care/diet as per general surgery recommendations.  NG tube has already been removed. -CT of the chest without contrast showed lingular nodule, slowly enlarging and recommended surveillance -Currently on gentle hydration.   -Oncology evaluation appreciated: Awaiting final pathology from surgery.   -Palliative care has evaluated the patient and patient has been changed to  DNR  End-stage renal disease on hemodialysis -Dialysis as per nephrology schedule.  Nephrology following  Diabetes mellitus type 2 with hyperglycemia and diabetic neuropathy and retinopathy -A1c 7.8.  Continue CBGs with SSI  Bilateral third toes cellulitis/ulcer with chronic osteomyelitis -Followed by podiatry Dr Amalia Hailey with plans for possible amputation during this admission Continue empiric IV vancomycin and cefepime -Patient is currently medically stable for surgical intervention by podiatry  Anemia of chronic disease -Hemoglobin stable.  Transfuse if hemoglobin less than 7.  Thrombocytopenia -Questionable cause.  No signs of bleeding  Hyperlipidemia -Resume statin probably upon discharge once medically stable  Asthma without exacerbation -Continue home regimen Breo Ellipta and albuterol inhaler.  Generalized deconditioning -Will need SNF on discharge    DVT prophylaxis: SCDs.  Stopped heparin because of thrombocytopenia  code Status: DNR Family Communication: Spoke to son/Kevin on phone on 09/01/20 Disposition Plan: Status is: Inpatient  Remains inpatient appropriate because:Hemodynamically unstable and Inpatient level of care appropriate due to severity of illness   Dispo: The patient is from: Home              Anticipated d/c is to: SNF              Anticipated d/c date is: > 3 days              Patient currently is not medically stable to d/c.   Difficult to place patient No   Consultants: GI/general surgery/nephrology/palliative care  Procedures: Colonoscopy on 08/29/2020 Right hemicolectomy on 08/30/2020  Antimicrobials: Vancomycin and cefepime from 08/27/2020 onwards   Subjective: Patient seen and examined at bedside.  Poor historian.  Denies worsening abdominal pain.  No overnight fever or vomiting reported.   Objective: Vitals:   09/03/20 1100 09/03/20 1900 09/03/20  2039 09/04/20 0555  BP: 104/60 105/62 (!) 96/58 (!) 102/58  Pulse: 79 78 78 75  Resp:  18 18 18 18   Temp: 98.7 F (37.1 C) 98.6 F (37 C) 97.8 F (36.6 C) 98.1 F (36.7 C)  TempSrc: Oral Oral Oral Oral  SpO2: 98% 98% 100% 97%  Weight:      Height:        Intake/Output Summary (Last 24 hours) at 09/04/2020 0733 Last data filed at 09/04/2020 0600 Gross per 24 hour  Intake 1080 ml  Output 0 ml  Net 1080 ml   Filed Weights   08/31/20 2041 09/02/20 0830 09/02/20 1105  Weight: 88.9 kg 88.9 kg 87.5 kg    Examination:  General exam: Looks chronically ill.  Poor historian.  Still on 2 L oxygen via nasal cannula  respiratory system: Decreased breath sounds at bases bilaterally with some crackles  cardiovascular system: Rate controlled, S1-S2 heard  gastrointestinal system: Abdomen is mildly distended, soft and dressing present with some surrounding tenderness.  Ostomy bag present.  extremities: Trace lower extremity edema present.  No clubbing or cyanosis Central nervous system: Alert and awake.  No focal neurological deficits.  Moves extremities  skin: No obvious petechiae/other lesions  psychiatry: Affect is flat  Data Reviewed: I have personally reviewed following labs and imaging studies  CBC: Recent Labs  Lab 08/30/20 0252 08/30/20 1601 08/31/20 0159 09/01/20 0400 09/02/20 0406 09/03/20 0502  WBC 6.4  --  11.5* 6.9 6.1 5.4  NEUTROABS 4.8  --  10.4* 5.5 4.7 3.7  HGB 9.4* 8.2* 10.0* 8.1* 7.2* 7.5*  HCT 29.6* 24.0* 31.3* 25.0* 22.2* 23.4*  MCV 97.7  --  96.3 97.7 96.1 97.5  PLT 128*  --  139* 90* 82* 119*   Basic Metabolic Panel: Recent Labs  Lab 08/29/20 0302 08/29/20 0303 08/29/20 0303 08/30/20 0252 08/30/20 1601 08/31/20 0159 09/01/20 0400 09/02/20 0406 09/03/20 0502  NA  --  139   < > 142 139 141 141 137 137  K  --  4.3   < > 4.0 4.1 5.2* 4.2 3.3* 3.5  CL  --  99   < > 103 103 104 101 99 99  CO2  --  22   < > 21*  --  15* 24 22 25   GLUCOSE  --  72   < > 84 114* 224* 127* 103* 117*  BUN  --  41*   < > 47* 50* 56* 31* 40* 23  CREATININE   --  5.74*   < > 7.14* 7.00* 8.03* 5.52* 6.67* 4.78*  CALCIUM  --  7.4*   < > 7.7*  --  7.5* 8.1* 7.5* 7.6*  MG 1.8  --   --   --   --   --  2.0  --   --   PHOS  --  6.3*  --  8.0*  --   --   --   --   --    < > = values in this interval not displayed.   GFR: Estimated Creatinine Clearance: 12.6 mL/min (A) (by C-G formula based on SCr of 4.78 mg/dL (H)). Liver Function Tests: Recent Labs  Lab 08/29/20 0303 08/30/20 0252  ALBUMIN 2.5* 2.3*   No results for input(s): LIPASE, AMYLASE in the last 168 hours. No results for input(s): AMMONIA in the last 168 hours. Coagulation Profile: No results for input(s): INR, PROTIME in the last 168 hours. Cardiac Enzymes: No results for input(s): CKTOTAL,  CKMB, CKMBINDEX, TROPONINI in the last 168 hours. BNP (last 3 results) No results for input(s): PROBNP in the last 8760 hours. HbA1C: No results for input(s): HGBA1C in the last 72 hours. CBG: Recent Labs  Lab 09/02/20 2008 09/03/20 1205 09/03/20 1849 09/03/20 2216 09/04/20 0649  GLUCAP 179* 159* 403* 162* 114*   Lipid Profile: No results for input(s): CHOL, HDL, LDLCALC, TRIG, CHOLHDL, LDLDIRECT in the last 72 hours. Thyroid Function Tests: No results for input(s): TSH, T4TOTAL, FREET4, T3FREE, THYROIDAB in the last 72 hours. Anemia Panel: No results for input(s): VITAMINB12, FOLATE, FERRITIN, TIBC, IRON, RETICCTPCT in the last 72 hours. Sepsis Labs: No results for input(s): PROCALCITON, LATICACIDVEN in the last 168 hours.  Recent Results (from the past 240 hour(s))  SARS Coronavirus 2 by RT PCR (hospital order, performed in [facility] hospital lab) Nasopharyngeal Nasopharyngeal Swab     Status: None   Collection Time: 08/27/20  9:46 PM   Specimen: Nasopharyngeal Swab  Result Value Ref Range Status   SARS Coronavirus 2 NEGATIVE NEGATIVE Final    Comment: (NOTE) SARS-CoV-2 target nucleic acids are NOT DETECTED.  The SARS-CoV-2 RNA is generally detectable in upper and  lower respiratory specimens during the acute phase of infection. The lowest concentration of SARS-CoV-2 viral copies this assay can detect is 250 copies / mL. A negative result does not preclude SARS-CoV-2 infection and should not be used as the sole basis for treatment or other patient management decisions.  A negative result may occur with improper specimen collection / handling, submission of specimen other than nasopharyngeal swab, presence of viral mutation(s) within the areas targeted by this assay, and inadequate number of viral copies (<250 copies / mL). A negative result must be combined with clinical observations, patient history, and epidemiological information.  Fact Sheet for Patients:   StrictlyIdeas.no  Fact Sheet for Healthcare Providers: BankingDealers.co.za  This test is not yet approved or  cleared by the [place] FDA and has been authorized for detection and/or diagnosis of SARS-CoV-2 by FDA under an Emergency Use Authorization (EUA).  This EUA will remain in effect (meaning this test can be used) for the duration of the COVID-19 declaration under Section 564(b)(1) of the Act, 21 U.S.C. section 360bbb-3(b)(1), unless the authorization is terminated or revoked sooner.  Performed at [facility], [place] [address], [place], [place] [address]   Surgical PCR screen     Status: None   Collection Time: 08/29/20 10:13 PM   Specimen: Nasal Mucosa; Nasal Swab  Result Value Ref Range Status   MRSA, PCR NEGATIVE NEGATIVE Final   Staphylococcus aureus NEGATIVE NEGATIVE Final    Comment: (NOTE) The Xpert SA Assay (FDA approved for NASAL specimens in patients 58 years of age and older), is one component of a comprehensive surveillance program. It is not intended to diagnose infection nor to guide or monitor treatment. Performed at [facility], [place] [address], [place], [place] [address]           Radiology Studies: No results found.      Scheduled Meds: . acetaminophen  1,000 mg Oral Q6H  . Chlorhexidine Gluconate Cloth  6 each Topical Q0600  . doxercalciferol  3 mcg Intravenous Q T,Th,Sa-HD  . fluticasone furoate-vilanterol  1 puff Inhalation Daily  . gabapentin  300 mg Oral TID  . insulin aspart  0-5 Units Subcutaneous QHS  . insulin aspart  0-6 Units Subcutaneous TID WC  . mouth rinse  15 mL Mouth Rinse  BID  . methocarbamol  1,000 mg Oral Q8H  . midodrine  5 mg Oral Q T,Th,Sa-HD  . multivitamin  1 tablet Oral QHS  . pantoprazole (PROTONIX) IV  40 mg Intravenous Q24H   Continuous Infusions: . sodium chloride 10 mL/hr (08/30/20 1800)  . sodium chloride    . ceFEPime (MAXIPIME) IV 2 g (09/02/20 2025)  . dextrose 50 mL/hr at 08/30/20 1130  . vancomycin Stopped (09/02/20 1104)          Aline August, MD [facility] 09/04/2020, 7:33 AM

## 2020-09-04 NOTE — Anesthesia Postprocedure Evaluation (Signed)
Anesthesia Post Note  Patient: [name]  Procedure(s) Performed: RIGHT THIRD DIGIT AMPUTATION (Bilateral Toe) ARTHROPLASTY OF THE THIRD DIGIT LEFT FOOT (Toe)     Patient location during evaluation: PACU Anesthesia Type: MAC Level of consciousness: awake and alert Pain management: pain level controlled Vital Signs Assessment: post-procedure vital signs reviewed and stable Respiratory status: spontaneous breathing, nonlabored ventilation and respiratory function stable Cardiovascular status: stable and blood pressure returned to baseline Postop Assessment: no apparent nausea or vomiting Anesthetic complications: no   No complications documented.  Last Vitals:  Vitals:   09/04/20 1935 09/04/20 2011  BP: 117/63 (!) 109/51  Pulse: 77 77  Resp: 17 17  Temp:  36.7 C  SpO2: 100% 99%    Last Pain:  Vitals:   09/04/20 1920  TempSrc:   PainSc: 0-No pain                 Montzerrat Brunell,W. EDMOND

## 2020-09-04 NOTE — TOC Progression Note (Signed)
Transition of Care [facility]) - Progression Note    Patient Details  Name: [name] MRN: [id number] Date of Birth: May 24, 1951  Transition of Care [facility]) CM/SW Contact  Bartholomew Crews, RN Phone Number: [phone] 09/04/2020, 3:31 PM  Clinical Narrative:     Spoke with patient at the bedside to discuss bed offers. Patient agreeable to STR at [facility]. [facility] can accept patient as early as Wednesday if medically ready. Will need covid test within 24 hours of discharge. TOC following for transition needs.        Expected Discharge Plan and Services                                                 Social Determinants of Health (SDOH) Interventions    Readmission Risk Interventions No flowsheet data found.

## 2020-09-04 NOTE — Progress Notes (Signed)
    PODIATRY PROGRESS NOTE  [name] MRN [id number] DOB[birth year] DOA 08/27/2020  PODIATRY CC: Ulcers/osteomyelitis bilateral third digits.   HPI:  Patient seen this morning for surgical preop regarding the osteomyelitis to the bilateral third digits.  Patient stable to proceed with surgery.  She is resting comfortably in bed this morning.   Physical examination: General: The patient is alert and oriented x3 in no acute distress.  Dermatology: Open wounds bilateral third digits with exposed bone right foot.  Findings consistent with osteomyelitis; chronic and somewhat stable.  No malodor noted.  Associated edema noted to the digits.   Neurovascular status intact.  Diminished sensation bilateral consistent with peripheral polyneuropathy  Musculoskeletal Exam: Prior history of bilateral first and second digit amputations.    Assessment/Plan of Care:  -Osteomyelitis/ulcers bilateral third digits               -Patient stable.  Surgical plan was discussed with the patient.  Preoperative orders placed today.  Surgery will consist of right third digit amputation.  Arthroplasty third digit left foot.  -Continue IV antibiotics as per admitting hospitalist  -N.p.o. after breakfast.  Surgery will be after 5 PM.  *Please contact me directly with any questions or concerns. [phone] cell    Edrick Kins, DPM [facility]  Dr. Edrick Kins, DPM    [address], [place] [address]                Office [phone]  Fax [phone]

## 2020-09-04 NOTE — Consult Note (Addendum)
[place] Nurse ostomy follow up Patient receiving care in [facility] 5M14 Stoma type/location: RLQ Ileostomy Stomal assessment/size: red budded 1" Peristomal assessment: Intact skin surrounding the stoma, scant bleeding  Output: large amount of thin brown stool. Pouch was full with a lot of gas.  Ostomy pouching: 2pc. Pouch Kellie Simmering # [phone]) Skin barrier Kellie Simmering # [phone]) Barrier ring Kellie Simmering # [phone]) Education provided: Educated patient on emptying when 1/3 full and letting gas out to prevent pouch from exploding. The patient was able to participate in pouch change by opening and closing clean pouch. She did ask questions and watched most of the pouch change. Talked with her about using only water to clean around the stoma. Measuring and cutting the skin barrier to size and attaching the pouch to the skin barrier. Changing pouch 1-2 times/week and emptying when 1/3 full. Will return Thursday or Friday for more teaching. Not sure yet if she will be discharged to rehab or home. Due for surgery on her feet today.  Enrolled patient in [facility] Start Discharge program: Yes   Cathlean Marseilles. Tamala Julian, MSN, RN, [place], Lysle Pearl, [facility] Wound Treatment Associate Pager [phone]

## 2020-09-04 NOTE — Progress Notes (Signed)
Physical Therapy Treatment Patient Details Name: [name] MRN: [id number] DOB: [birth year] Today's Date: 09/04/2020    History of Present Illness Pt is a 70 y.o. female admitted 08/27/20 with nausea/vomiting and malaise, missed HD due to feeling poorly and winter weather. CT with small and large bowel obstruction secondary to ascending mass in RLQ, concerning for metastatic process. S/p hemicolectomy, ileostomy 2/9. 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 PMH includes CA, CKD, DM, HTN, toe amputations.    PT Comments    Pt received in bed, pleasant and cooperative. Improvement in cognition with pt much more aware but still some deficits in attention, memory, and problem solving. Appropriate answers to questions but tangential answers. Pt was min A for bed mobility and mod A for powering up into sit to stand. Min A x1 for ambulation ~15 ft for assistance navigating RW. VSS on 2L O2. Pt left in chair with all needs met and call bell within reach.   Follow Up Recommendations  SNF;Supervision for mobility/OOB     Equipment Recommendations  Rolling walker with 5" wheels;3in1 (PT);Wheelchair (measurements PT);Wheelchair cushion (measurements PT);Hospital bed;Other (comment) (hoyer lift)    Recommendations for Other Services       Precautions / Restrictions Precautions Precautions: Fall;Other (comment) Precaution Comments: osteomy R LQ, toe wounds (plan for amputations 2/14), abdominal incision Restrictions Weight Bearing Restrictions: No    Mobility  Bed Mobility Overal bed mobility: Needs Assistance Bed Mobility: Supine to Sit     Supine to sit: HOB elevated;Min assist     General bed mobility comments: HOB elevated and use of bedrails. Min A to scoot forward on EOB and bring trunkforward    Transfers Overall transfer level: Needs assistance Equipment used: Rolling walker (2 wheeled) Transfers: Sit to/from Stand Sit to Stand:  Mod assist         General transfer comment: Mod A for power up into standing. Increased discomfort over abdominal incision in standing  Ambulation/Gait Ambulation/Gait assistance: Min assist Gait Distance (Feet): 15 Feet Assistive device: Rolling walker (2 wheeled) Gait Pattern/deviations: Step-to pattern;Decreased step length - right;Decreased step length - left;Trunk flexed Gait velocity: decreased   General Gait Details: Min A for navigation of RW. Slightly unsteady but no overt LOB   Stairs             Wheelchair Mobility    Modified Rankin (Stroke Patients Only)       Balance Overall balance assessment: Needs assistance Sitting-balance support: Feet supported;Bilateral upper extremity supported Sitting balance-Leahy Scale: Fair Sitting balance - Comments: Needed verbal cueing to prevent posterior lean Postural control: Posterior lean Standing balance support: Bilateral upper extremity supported;During functional activity Standing balance-Leahy Scale: Poor Standing balance comment: Needs B UE support in standing                            Cognition Arousal/Alertness: Awake/alert Behavior During Therapy: Flat affect Overall Cognitive Status: No family/caregiver present to determine baseline cognitive functioning Area of Impairment: Orientation;Attention;Memory;Following commands;Safety/judgement;Awareness;Problem solving                   Current Attention Level: Sustained Memory: Decreased short-term memory;Decreased recall of precautions Following Commands: Follows one step commands with increased time;Follows multi-step commands inconsistently Safety/Judgement: Decreased awareness of safety;Decreased awareness of deficits Awareness: Intellectual Problem Solving: Slow processing;Decreased initiation;Difficulty sequencing;Requires verbal cues;Requires tactile cues General Comments: Pt able to follow simple commands with increased time,  but decreased problem solving, awareness of deficits, and attention. Generally responds to questions appropriately but with tangential answers. Cueing to stay on task      Exercises      General Comments General comments (skin integrity, edema, etc.): VSS on 2 L O2      Pertinent Vitals/Pain Pain Assessment: Faces Faces Pain Scale: Hurts little more Pain Location: Abdominal incision Pain Descriptors / Indicators: Grimacing;Sore Pain Intervention(s): Monitored during session    Home Living                      Prior Function            PT Goals (current goals can now be found in the care plan section) Acute Rehab PT Goals Patient Stated Goal: get back home to dogs Potential to Achieve Goals: Fair    Frequency    Min 3X/week      PT Plan Frequency needs to be updated    Co-evaluation              AM-PAC PT "6 Clicks" Mobility   Outcome Measure  Help needed turning from your back to your side while in a flat bed without using bedrails?: A Little Help needed moving from lying on your back to sitting on the side of a flat bed without using bedrails?: A Little Help needed moving to and from a bed to a chair (including a wheelchair)?: A Little Help needed standing up from a chair using your arms (e.g., wheelchair or bedside chair)?: A Lot Help needed to walk in hospital room?: A Little Help needed climbing 3-5 steps with a railing? : A Lot 6 Click Score: 16    End of Session Equipment Utilized During Treatment: Oxygen;Gait belt Activity Tolerance: Patient tolerated treatment well Patient left: with call bell/phone within reach;in chair Nurse Communication: Mobility status PT Visit Diagnosis: Unsteadiness on feet (R26.81);Difficulty in walking, not elsewhere classified (R26.2);Muscle weakness (generalized) (M62.81);Other abnormalities of gait and mobility (R26.89);Pain Pain - Right/Left:  (abdominal) Pain - part of body:  (abdominal)     Time:  -      Charges:                        Rosita Kea, SPT

## 2020-09-04 NOTE — Progress Notes (Signed)
5 Days Post-Op  Subjective: Seems to be doing ok.  Got up to EOB yesterday only.  Hasn't ambulated yet. Tolerating her renal diet well.  Ate a vast majority of her breakfast this morning.  Got a little full yesterday, but better today.   ROS: See above, otherwise other systems negative  Objective: Vital signs in last 24 hours: Temp:  [97.8 F (36.6 C)-98.7 F (37.1 C)] 98.1 F (36.7 C) (02/14 0555) Pulse Rate:  [75-89] 89 (02/14 0812) Resp:  [18] 18 (02/14 0812) BP: (96-105)/(58-62) 102/58 (02/14 0555) SpO2:  [97 %-100 %] 98 % (02/14 0812) Last BM Date: 09/03/20  Intake/Output from previous day: 02/13 0701 - 02/14 0700 In: 1080 [P.O.:1080] Out: 0  Intake/Output this shift: No intake/output data recorded.  PE: Abd: soft, appropriately tender, ileostomy working well with some semi-solid and liquid stool in the bag.  Stoma is pink and viable.  Midline wound is c/d/i with staples present.  No erythema  Lab Results:  Recent Labs    09/02/20 0406 09/03/20 0502  WBC 6.1 5.4  HGB 7.2* 7.5*  HCT 22.2* 23.4*  PLT 82* 102*   BMET Recent Labs    09/02/20 0406 09/03/20 0502  NA 137 137  K 3.3* 3.5  CL 99 99  CO2 22 25  GLUCOSE 103* 117*  BUN 40* 23  CREATININE 6.67* 4.78*  CALCIUM 7.5* 7.6*   PT/INR No results for input(s): LABPROT, INR in the last 72 hours. CMP     Component Value Date/Time   NA 137 09/03/2020 0502   K 3.5 09/03/2020 0502   CL 99 09/03/2020 0502   CO2 25 09/03/2020 0502   GLUCOSE 117 (H) 09/03/2020 0502   BUN 23 09/03/2020 0502   CREATININE 4.78 (H) 09/03/2020 0502   CREATININE 6.19 (H) 02/07/2020 1159   CALCIUM 7.6 (L) 09/03/2020 0502   PROT 6.3 (L) 08/28/2020 0424   ALBUMIN 2.3 (L) 08/30/2020 0252   AST 17 08/28/2020 0424   ALT 11 08/28/2020 0424   ALKPHOS 59 08/28/2020 0424   BILITOT 0.9 08/28/2020 0424   GFRNONAA 9 (L) 09/03/2020 0502   GFRNONAA 6 (L) 02/07/2020 1159   GFRAA 7 (L) 02/07/2020 1159   Lipase     Component Value  Date/Time   LIPASE 33 08/27/2020 1350       Studies/Results: No results found.  Anti-infectives: Anti-infectives (From admission, onward)   Start     Dose/Rate Route Frequency Ordered Stop   08/31/20 2000  ceFEPIme (MAXIPIME) 2 g in sodium chloride 0.9 % 100 mL IVPB        2 g 200 mL/hr over 30 Minutes Intravenous Every T-Th-Sa (Hemodialysis) 08/31/20 1145     08/31/20 1200  vancomycin (VANCOCIN) IVPB 1000 mg/200 mL premix        1,000 mg 200 mL/hr over 60 Minutes Intravenous Every T-Th-Sa (Hemodialysis) 08/31/20 1145     08/30/20 1157  vancomycin variable dose per unstable renal function (pharmacist dosing)  Status:  Discontinued         Does not apply See admin instructions 08/30/20 1157 08/31/20 1145   08/30/20 1015  clindamycin (CLEOCIN) IVPB 900 mg       "And" Linked Group Details   900 mg 100 mL/hr over 30 Minutes Intravenous 60 min pre-op 08/29/20 1623 08/30/20 1341   08/30/20 1015  gentamicin (GARAMYCIN) 440 mg in dextrose 5 % 100 mL IVPB       "And" Linked Group Details   5  mg/kg  88.7 kg 111 mL/hr over 60 Minutes Intravenous 60 min pre-op 08/29/20 1623 08/30/20 1138   08/29/20 1345  vancomycin (VANCOREADY) IVPB 750 mg/150 mL        750 mg 150 mL/hr over 60 Minutes Intravenous  Once 08/29/20 1257 08/29/20 1545   08/28/20 1345  vancomycin (VANCOCIN) IVPB 750 mg/150 ml premix  Status:  Discontinued        750 mg 150 mL/hr over 60 Minutes Intravenous  Once 08/28/20 1335 08/29/20 1257   08/27/20 2300  vancomycin (VANCOREADY) IVPB 2000 mg/400 mL        2,000 mg 200 mL/hr over 120 Minutes Intravenous  Once 08/27/20 2211 08/28/20 0040   08/27/20 2200  ceFEPIme (MAXIPIME) 1 g in sodium chloride 0.9 % 100 mL IVPB  Status:  Discontinued        1 g 200 mL/hr over 30 Minutes Intravenous Every 24 hours 08/27/20 2142 08/31/20 1145   08/27/20 2130  vancomycin (VANCOCIN) IVPB 1000 mg/200 mL premix  Status:  Discontinued        1,000 mg 200 mL/hr over 60 Minutes Intravenous  Once  08/27/20 2118 08/27/20 2209   08/27/20 2130  ceFEPIme (MAXIPIME) 2 g in sodium chloride 0.9 % 100 mL IVPB  Status:  Discontinued        2 g 200 mL/hr over 30 Minutes Intravenous  Once 08/27/20 2118 08/27/20 2139       Assessment/Plan ESRD on HD TTS- hyperkalemia on todays labs (K 5.2) Hx renal cell carcinoma; s/p partial right nephrectomy adrenalectomy 2018 Type 2 diabetes; diabetic neuropathy, diabetic retinopathy. 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 Osteo of b/l third LE digits - podiatry following GERD Hyperlipidemia Hx asthma Protein Calorie Malnutrition - Pre-alb 11.4, tolerating renal diet Anemia - labs pending today Thrombocytopenia- improving   POD 5, S/p exploratory laparotomy,right hemicolectomy, ileostomy creation- Dr. Bobbye Morton - 08/30/2020 for adenocarcinoma of colon - surgical path: A. 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  - cont renal diet -plan to remove staples on POD 10 if still here -will leave on the list, but chart check most days.  She is surgically stable for DC home to SNF when medically stable and SNF found. - Mobilize - PT consult - Pulm toilet - WOCN following for new ostomy  FEN -renal diet VTE -SCDs, Heparin subq ID -Vanc and Cefepime for osteo (per podiatry). Received clinda and gent periop.  Follow-Up- Dr. Bobbye Morton 10/05/2020 at 10:30 AM   LOS: 8  days    Henreitta Cea , [facility] 09/04/2020, 9:07 AM Please see Amion for pager number during day hours 7:00am-4:30pm or 7:00am -11:30am on weekends

## 2020-09-04 NOTE — Brief Op Note (Signed)
09/04/2020  7:18 PM  PATIENT:  [name]  70 y.o. female  PRE-OPERATIVE DIAGNOSIS:  OSTEOMYLITIS  POST-OPERATIVE DIAGNOSIS:  OSTEOMYLITIS  PROCEDURE:  Procedure(s): RIGHT THIRD DIGIT AMPUTATION (Bilateral) ARTHROPLASTY OF THE THIRD DIGIT LEFT FOOT  SURGEON:  Surgeon(s) and Role:    Edrick Kins, DPM - Primary  PHYSICIAN ASSISTANT:   ASSISTANTS: none   ANESTHESIA:   local  EBL:  minimal   BLOOD ADMINISTERED:none  DRAINS: none   LOCAL MEDICATIONS USED:  MARCAINE    and LIDOCAINE   SPECIMEN:  No Specimen  DISPOSITION OF SPECIMEN:  N/A  COUNTS:  YES  TOURNIQUET:   Total Tourniquet Time Documented: Calf (N/A) - 30 minutes Calf (N/A) - 26 minutes Total: Calf (N/A) - 56 minutes   DICTATION: .Viviann Spare Dictation  PLAN OF CARE: Admit to inpatient   PATIENT DISPOSITION:  PACU - hemodynamically stable.   Delay start of Pharmacological VTE agent (>24hrs) due to surgical blood loss or risk of bleeding: not applicable

## 2020-09-04 NOTE — Anesthesia Preprocedure Evaluation (Signed)
Anesthesia Evaluation  Patient identified by MRN, date of birth, ID band Patient awake    Reviewed: Allergy & Precautions, H&P , NPO status , Patient's Chart, lab work & pertinent test results  Airway Mallampati: II   Neck ROM: full    Dental   Pulmonary shortness of breath, asthma ,    breath sounds clear to auscultation       Cardiovascular hypertension, + CAD and + Peripheral Vascular Disease   Rhythm:regular Rate:Normal     Neuro/Psych PSYCHIATRIC DISORDERS Depression  Neuromuscular disease    GI/Hepatic GERD  ,  Endo/Other  diabetes, Type 2  Renal/GU ESRF and DialysisRenal disease     Musculoskeletal  (+) Arthritis ,   Abdominal   Peds  Hematology  (+) Blood dyscrasia, anemia ,   Anesthesia Other Findings   Reproductive/Obstetrics                             Anesthesia Physical Anesthesia Plan  ASA: IV  Anesthesia Plan: General   Post-op Pain Management:    Induction: Intravenous  PONV Risk Score and Plan: 3 and Ondansetron and Treatment may vary due to age or medical condition  Airway Management Planned: LMA  Additional Equipment:   Intra-op Plan:   Post-operative Plan: Extubation in OR  Informed Consent: I have reviewed the patients History and Physical, chart, labs and discussed the procedure including the risks, benefits and alternatives for the proposed anesthesia with the patient or authorized representative who has indicated his/her understanding and acceptance.     Dental advisory given  Plan Discussed with: CRNA, Anesthesiologist and Surgeon  Anesthesia Plan Comments:         Anesthesia Quick Evaluation

## 2020-09-04 NOTE — Transfer of Care (Signed)
Immediate Anesthesia Transfer of Care Note  Patient: [name]  Procedure(s) Performed: RIGHT THIRD DIGIT AMPUTATION (Bilateral Toe) ARTHROPLASTY OF THE THIRD DIGIT LEFT FOOT (Toe)  Patient Location: PACU  Anesthesia Type:MAC  Level of Consciousness: awake, alert  and oriented  Airway & Oxygen Therapy: Patient Spontanous Breathing and Patient connected to nasal cannula oxygen  Post-op Assessment: Report given to RN and Post -op Vital signs reviewed and stable  Post vital signs: Reviewed and stable  Last Vitals:  Vitals Value Taken Time  BP    Temp    Pulse 78 09/04/20 1921  Resp 11 09/04/20 1921  SpO2 98 % 09/04/20 1921  Vitals shown include unvalidated device data.  Last Pain:  Vitals:   09/04/20 1614  TempSrc:   PainSc: 0-No pain      Patients Stated Pain Goal: 0 (90/24/09 7353)  Complications: No complications documented.

## 2020-09-04 NOTE — Progress Notes (Signed)
[facility] Progress Note   Assessment/ Plan:   1.  Adenocarcinoma of the colon with nearly obstructing mass: She is status post exploratory laparotomy/right hemicolectomy and ileostomy on 08/30/2020.  She appears to be doing well from a surgical standpoint and oncology and palliative care service are helping with establishing prognosis/goals of care. 2. ESRD: Usually on a TTS dialysis schedule, will order for hemodialysis again tomorrow.  She does not have any acute electrolyte abnormality, significant volume overload or symptoms/signs indicating dialysis today. 3. Anemia: With low hemoglobin and hematocrit, PSA discontinued with concerns of recent malignancy-will confer with oncology as to whether this is permissive especially if no additional chemotherapy is planned. 4. CKD-MBD: Calcium level within acceptable range, continue calcium carbonate for phosphorus binding and follow-up on phosphorus levels. 5. Nutrition: Resumed renal diet which she has been tolerating better, n.p.o. after breakfast for anticipated surgery later today. 6.  Osteomyelitis/ulcers of bilateral third toes: Plans noted by podiatry for surgery later today; right third toe amputation and arthroplasty of the left third toe.  Subjective:   Reports that she was hungry this morning but unable to finish her breakfast because it got cold and she had many interruptions.   Objective:   BP 93/63 (BP Location: Right Arm)   Pulse 86   Temp 98.5 F (36.9 C)   Resp 18   Ht 5\' 7"  (1.702 m)   Wt 87.5 kg   SpO2 97%   BMI 30.21 kg/m   Physical Exam: Gen: Appears comfortable sitting in recliner CVS: Pulse regular rhythm, S1 and S2 with ejection systolic murmur Resp: Clear to auscultation bilaterally, no rales/rhonchi Abd: Soft, obese, ileostomy site pink with intact bag Ext: No lower extremity edema, left brachiobasilic fistula with palpable thrill  Labs: BMET Recent Labs  Lab 08/29/20 0303 08/30/20 0252  08/30/20 1601 08/31/20 0159 09/01/20 0400 09/02/20 0406 09/03/20 0502 09/04/20 0825  NA 139 142 139 141 141 137 137 132*  K 4.3 4.0 4.1 5.2* 4.2 3.3* 3.5 3.7  CL 99 103 103 104 101 99 99 94*  CO2 22 21*  --  15* 24 22 25 23   GLUCOSE 72 84 114* 224* 127* 103* 117* 136*  BUN 41* 47* 50* 56* 31* 40* 23 35*  CREATININE 5.74* 7.14* 7.00* 8.03* 5.52* 6.67* 4.78* 6.94*  CALCIUM 7.4* 7.7*  --  7.5* 8.1* 7.5* 7.6* 7.6*  PHOS 6.3* 8.0*  --   --   --   --   --   --    CBC Recent Labs  Lab 08/31/20 0159 09/01/20 0400 09/02/20 0406 09/03/20 0502  WBC 11.5* 6.9 6.1 5.4  NEUTROABS 10.4* 5.5 4.7 3.7  HGB 10.0* 8.1* 7.2* 7.5*  HCT 31.3* 25.0* 22.2* 23.4*  MCV 96.3 97.7 96.1 97.5  PLT 139* 90* 82* 102*     Medications:    . acetaminophen  1,000 mg Oral Q6H  . Chlorhexidine Gluconate Cloth  6 each Topical Q0600  . doxercalciferol  3 mcg Intravenous Q T,Th,Sa-HD  . fluticasone furoate-vilanterol  1 puff Inhalation Daily  . gabapentin  300 mg Oral TID  . insulin aspart  0-5 Units Subcutaneous QHS  . insulin aspart  0-6 Units Subcutaneous TID WC  . mouth rinse  15 mL Mouth Rinse BID  . methocarbamol  1,000 mg Oral Q8H  . midodrine  5 mg Oral Q T,Th,Sa-HD  . multivitamin  1 tablet Oral QHS  . pantoprazole (PROTONIX) IV  40 mg Intravenous Q24H   Ulice Dash  Posey Pronto, MD 09/04/2020, 11:17 AM

## 2020-09-05 ENCOUNTER — Ambulatory Visit: Payer: Medicare Other | Admitting: Gastroenterology

## 2020-09-05 ENCOUNTER — Encounter (HOSPITAL_COMMUNITY): Payer: Self-pay | Admitting: Podiatry

## 2020-09-05 DIAGNOSIS — E11628 Type 2 diabetes mellitus with other skin complications: Secondary | ICD-10-CM | POA: Diagnosis not present

## 2020-09-05 DIAGNOSIS — C182 Malignant neoplasm of ascending colon: Secondary | ICD-10-CM | POA: Diagnosis not present

## 2020-09-05 DIAGNOSIS — K56600 Partial intestinal obstruction, unspecified as to cause: Secondary | ICD-10-CM | POA: Diagnosis not present

## 2020-09-05 DIAGNOSIS — N186 End stage renal disease: Secondary | ICD-10-CM | POA: Diagnosis not present

## 2020-09-05 LAB — CBC WITH DIFFERENTIAL/PLATELET
Abs Immature Granulocytes: 0.28 10*3/uL — ABNORMAL HIGH (ref 0.00–0.07)
Basophils Absolute: 0 10*3/uL (ref 0.0–0.1)
Basophils Relative: 0 %
Eosinophils Absolute: 0.1 10*3/uL (ref 0.0–0.5)
Eosinophils Relative: 2 %
HCT: 22.2 % — ABNORMAL LOW (ref 36.0–46.0)
Hemoglobin: 7.2 g/dL — ABNORMAL LOW (ref 12.0–15.0)
Immature Granulocytes: 5 %
Lymphocytes Relative: 21 %
Lymphs Abs: 1.2 10*3/uL (ref 0.7–4.0)
MCH: 31.2 pg (ref 26.0–34.0)
MCHC: 32.4 g/dL (ref 30.0–36.0)
MCV: 96.1 fL (ref 80.0–100.0)
Monocytes Absolute: 0.4 10*3/uL (ref 0.1–1.0)
Monocytes Relative: 6 %
Neutro Abs: 3.9 10*3/uL (ref 1.7–7.7)
Neutrophils Relative %: 66 %
Platelets: 114 10*3/uL — ABNORMAL LOW (ref 150–400)
RBC: 2.31 MIL/uL — ABNORMAL LOW (ref 3.87–5.11)
RDW: 15.3 % (ref 11.5–15.5)
WBC: 5.9 10*3/uL (ref 4.0–10.5)
nRBC: 0 % (ref 0.0–0.2)

## 2020-09-05 LAB — SARS CORONAVIRUS 2 (TAT 6-24 HRS): SARS Coronavirus 2: NEGATIVE

## 2020-09-05 LAB — GLUCOSE, CAPILLARY
Glucose-Capillary: 112 mg/dL — ABNORMAL HIGH (ref 70–99)
Glucose-Capillary: 163 mg/dL — ABNORMAL HIGH (ref 70–99)
Glucose-Capillary: 177 mg/dL — ABNORMAL HIGH (ref 70–99)

## 2020-09-05 MED ORDER — VANCOMYCIN HCL IN DEXTROSE 1-5 GM/200ML-% IV SOLN
INTRAVENOUS | Status: AC
  Administered 2020-09-05: 1000 mg via INTRAVENOUS
  Filled 2020-09-05: qty 200

## 2020-09-05 MED ORDER — MIDODRINE HCL 5 MG PO TABS
ORAL_TABLET | ORAL | Status: AC
  Administered 2020-09-05: 5 mg via ORAL
  Filled 2020-09-05: qty 1

## 2020-09-05 MED ORDER — DOXERCALCIFEROL 4 MCG/2ML IV SOLN
INTRAVENOUS | Status: AC
  Administered 2020-09-05: 3 ug via INTRAVENOUS
  Filled 2020-09-05: qty 2

## 2020-09-05 MED ORDER — SODIUM CHLORIDE 0.9 % IV SOLN
INTRAVENOUS | Status: DC | PRN
  Administered 2020-09-05 – 2020-09-06 (×2): 250 mL via INTRAVENOUS

## 2020-09-05 NOTE — Plan of Care (Signed)
  Problem: Nutrition: Goal: Adequate nutrition will be maintained Outcome: Progressing   Problem: Pain Managment: Goal: General experience of comfort will improve Outcome: Progressing   

## 2020-09-05 NOTE — Progress Notes (Signed)
OT Cancellation Note  Patient Details Name: [name] MRN: [id number] DOB: 1951-03-04   Cancelled Treatment:    Reason Eval/Treat Not Completed: Patient at procedure or test/ unavailable Off unit for HD. Will follow-up for OT session as time allows.   Layla Maw 09/05/2020, 1:25 PM

## 2020-09-05 NOTE — Procedures (Signed)
Patient seen on Hemodialysis. BP (!) 95/58 (BP Location: Right Arm)   Pulse 77   Temp 98.6 F (37 C)   Resp 16   Ht 5\' 7"  (1.702 m)   Wt 87.5 kg   SpO2 97%   BMI 30.21 kg/m   QB 400, UF goal 1.5L Tolerating treatment without complaints at this time.   Elmarie Shiley MD [facility]. Office # [phone] Pager # [phone] 12:04 PM

## 2020-09-05 NOTE — Progress Notes (Signed)
   PODIATRY PROGRESS NOTE  [name] MRN [id number] DOBMay 22, 1952 [name] 08/27/2020  Podiatry Chief complaint: Ulcers/osteomyelitis bilateral third digits.   Status post right third toe amputation.  Left third toe arthroplasty.  DOS: 09/04/2020  -Patient status post third toe amp RT. third toe arthroplasty LT. patient seen this evening.  She is doing well in regards to her feet.  No pain associated to her toes.  Dressings are clean dry and intact this evening.  -From a podiatric surgical standpoint, patient okay to be discharged.    -Surgical margins appeared very healthy and viable.  Short course of post discharge oral antibiotics recommended.  Recommend oral doxycycline 100 mg 2 times daily #14.   -Patient may be weightbearing as tolerated bilateral.   -Keep dressings clean dry and intact until follow-up in the office  -Recommended follow-up in office 09/13/2020.  Call for appointment.  -Podiatry to sign off at this time.  Please contact me directly with any questions or concerns. Cell [phone].    Edrick Kins, DPM [facility]  Dr. Edrick Kins, DPM    [address], [place] [address]                Office [phone]  Fax [phone]

## 2020-09-05 NOTE — Progress Notes (Signed)
[facility] Progress Note   Subjective:   Patient seen and examined at bedside.  States she had nausea yesterday after eating but reports she went a "little crazy" and ate too much.  Improved today.  Denies vomiting, diarrhea, CP, SOB, abdominal or foot pain. Surgery yesterday to remove 3rd digit on b/l feet.   Objective Vitals:   09/04/20 2125 09/05/20 0450 09/05/20 0744 09/05/20 1102  BP: 105/65 (!) 94/55  (!) 95/58  Pulse: 77 73  77  Resp: 16 17  16   Temp: 97.8 F (36.6 C) 98 F (36.7 C)  98.6 F (37 C)  TempSrc: Oral Oral    SpO2: 98% 99% 98% 97%  Weight:      Height:       Physical Exam General:chronically ill appearing, alert female in NAD Heart:RRR, no mrg Lungs:CTAB Abdomen:soft, midline staples intact with no erythema or drainage, +ostomy in RLQ with brown liquid stool Extremities:1+ edema in hips, trace to 1+ in calves b/l Dialysis Access: RU AVF +b/t   Filed Weights   08/31/20 2041 09/02/20 0830 09/02/20 1105  Weight: 88.9 kg 88.9 kg 87.5 kg    Intake/Output Summary (Last 24 hours) at 09/05/2020 1103 Last data filed at 09/05/2020 0600 Gross per 24 hour  Intake 580 ml  Output 175 ml  Net 405 ml    Additional Objective Labs: Basic Metabolic Panel: Recent Labs  Lab 08/30/20 0252 08/30/20 1601 09/02/20 0406 09/03/20 0502 09/04/20 0825  NA 142   < > 137 137 132*  K 4.0   < > 3.3* 3.5 3.7  CL 103   < > 99 99 94*  CO2 21*   < > 22 25 23   GLUCOSE 84   < > 103* 117* 136*  BUN 47*   < > 40* 23 35*  CREATININE 7.14*   < > 6.67* 4.78* 6.94*  CALCIUM 7.7*   < > 7.5* 7.6* 7.6*  PHOS 8.0*  --   --   --   --    < > = values in this interval not displayed.   Liver Function Tests: Recent Labs  Lab 08/30/20 0252  ALBUMIN 2.3*   CBC: Recent Labs  Lab 08/31/20 0159 09/01/20 0400 09/02/20 0406 09/03/20 0502 09/05/20 0319  WBC 11.5* 6.9 6.1 5.4 5.9  NEUTROABS 10.4* 5.5 4.7 3.7 3.9  HGB 10.0* 8.1* 7.2* 7.5* 7.2*  HCT 31.3* 25.0* 22.2*  23.4* 22.2*  MCV 96.3 97.7 96.1 97.5 96.1  PLT 139* 90* 82* 102* 114*   CBG: Recent Labs  Lab 09/04/20 1119 09/04/20 1610 09/04/20 1922 09/04/20 2120 09/05/20 0641  GLUCAP 155* 141* 124* 165* 112*    Medications: . sodium chloride 10 mL/hr (08/30/20 1800)  . sodium chloride    . ceFEPime (MAXIPIME) IV 2 g (09/02/20 2025)  . vancomycin Stopped (09/02/20 1104)   . acetaminophen  1,000 mg Oral Q6H  . Chlorhexidine Gluconate Cloth  6 each Topical Q0600  . doxercalciferol  3 mcg Intravenous Q T,Th,Sa-HD  . fluticasone furoate-vilanterol  1 puff Inhalation Daily  . gabapentin  300 mg Oral TID  . insulin aspart  0-5 Units Subcutaneous QHS  . insulin aspart  0-6 Units Subcutaneous TID WC  . mouth rinse  15 mL Mouth Rinse BID  . methocarbamol  1,000 mg Oral Q8H  . midodrine  5 mg Oral Q T,Th,Sa-HD  . multivitamin  1 tablet Oral QHS  . pantoprazole (PROTONIX) IV  40 mg Intravenous Q24H  Dialysis Orders: [facility] 3h 25min 400/500 87kg 2/2.25 bath P2 LUA AVF Hep 5000+ 1000 midrun - mircera 100ug last 1/11 - hect 3 ug  Assessment/Plan: 1. ESRD- On HD TTS.  Heparin on hold d/t recent surgery.  Plan for HD today per regular schedule.  2. 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 3. Osteomyelitis of B/l 3rd digits - s/p amputation by podiatry yesterday.  Pain well controlled.  3. Hypertension/volume-BP Soft/stable.  On midodrine for BP support withHD. Increased LE edema in exam.  UF as tolerated.  4. Renal osteodystrophy - Corrected calciumcontrolled. Phos has been elevated, takes Tums as her binder (has refused other binders). Continue hectorol with HD. 5. Anemia -Hgb variable,drop to 7.2 today post surgery yesterday. Stopping mircera with new dx of  malignancy. To confer with Onc to see if able to give ESA if no additional chemotherapy planned.  No IV iron with osteomyelitis. Transfuse PRN for Hgb<7 6. DMtype 2- peradmit, on IV dextrose 7. Asthma without exasperation on meds per admit   Jen Mow, PA-C [place] 09/05/2020,11:03 AM  LOS: 9 days

## 2020-09-05 NOTE — Progress Notes (Signed)
Patient ID: [name], female   DOB: 1951-02-16, 70 y.o.   MRN: [id number]  PROGRESS NOTE    [name]  ALP:[id number] DOB: February 20, 1951 DOA: 08/27/2020 PCP: [name] Frizzle, MD   Brief Narrative:  70 year old female history of asthma, anemia, renal cell carcinoma status post nephrectomy, type 2 diabetes mellitus, end-stage renal disease on hemodialysis Tuesday Thursday Saturday, peripheral vascular disease, hypertension, hyperlipidemia, GERD, diabetic retinopathy as well as neuropathy presented to the ED with acute abdominal pain, intractable nausea and vomiting to liquids and solids. Patient noted to have missed hemodialysis on Tuesday and Saturday prior to admission.  Patient also noted to have been scheduled for amputation of the right third toe per podiatry.  In the ED: work-up with CT abdomen and pelvis revealed small and large bowel obstruction secondary to previously demonstrated colonic mass in the ascending colon. NG tube placed.  Nephrology consulted.  GI consulted as well as general surgery.  She underwent colonoscopy on 08/29/2020 which showed likely malignant tumor which was biopsied.  She underwent surgical intervention on 08/30/2020.  PT recommended SNF placement.  She underwent surgical intervention by podiatry on 09/04/2020.  Assessment & Plan:   Small and large bowel obstruction secondary right ascending colon mass New diagnosis of colonic adenocarcinoma -Status post colonoscopy on 08/29/2020 by GI which showed likely malignant tumor, biopsied, removed for 3 to 8 mm polyps, pathology shows colonic adenocarcinoma. -Status post exploratory laparotomy, right hemicolectomy, ileostomy creation on 08/30/2020.  Wound care/diet as per general surgery recommendations.  NG tube has already been removed. -CT of the chest without contrast showed lingular nodule, slowly enlarging and recommended surveillance -Oncology evaluation appreciated: Will follow further recommendations from oncology  regarding further plan of care.  I have sent a message via secure chat today. -Palliative care has evaluated the patient and patient has been changed to DNR  End-stage renal disease on hemodialysis -Dialysis as per nephrology schedule.  Nephrology following  Diabetes mellitus type 2 with hyperglycemia and diabetic neuropathy and retinopathy -A1c 7.8.  Continue CBGs with SSI  Bilateral third toes cellulitis/ulcer with chronic osteomyelitis -Followed by podiatry [name] with plans for possible amputation during this admission Currently on empiric IV vancomycin and cefepime -Status post right third digit amputation and arthroplasty of third digit left foot by podiatry/[name]. Amalia [name] on 09/04/2020.  follow-up further recommendations from podiatry regarding duration of antibiotic course.  Anemia of chronic disease -Hemoglobin stable.  Transfuse if hemoglobin less than 7.  Thrombocytopenia -Questionable cause.  No signs of bleeding  Hyperlipidemia -Resume statin probably upon discharge once medically stable  Asthma without exacerbation -Continue home regimen Breo Ellipta and albuterol inhaler.  Generalized deconditioning -Will need SNF on discharge    DVT prophylaxis: SCDs.  Stopped heparin because of thrombocytopenia  code Status: DNR Family Communication: Spoke to son/[name] on phone on 09/01/20 Disposition Plan: Status is: Inpatient  Remains inpatient appropriate because:Hemodynamically unstable and Inpatient level of care appropriate due to severity of illness.  Possible discharge to SNF in 1 to 2 days if remains stable and once bed is available.   Dispo: The patient is from: Home              Anticipated d/c is to: SNF              Anticipated d/c date is: 1 day              Patient currently is not medically stable to d/c.   Difficult to place patient  No   Consultants: GI/general surgery/nephrology/palliative care/podiatry  Procedures: Colonoscopy on 08/29/2020 Right  hemicolectomy on 08/30/2020  right third digit amputation and arthroplasty of third digit left foot by podiatry/[name]. Amalia [name] on 09/04/2020.  Antimicrobials: Vancomycin and cefepime from 08/27/2020 onwards   Subjective: Patient seen and examined at bedside.  Denies worsening shortness of breath, fever, vomiting.  Oral intake improving.  Poor historian.   Objective: Vitals:   09/04/20 2011 09/04/20 2052 09/04/20 2125 09/05/20 0450  BP: (!) 109/51 108/63 105/65 (!) 94/55  Pulse: 77 81 77 73  Resp: 17 18 16 17   Temp: 98 F (36.7 C) (!) 97.5 F (36.4 C) 97.8 F (36.6 C) 98 F (36.7 C)  TempSrc:  Oral Oral Oral  SpO2: 99% 98% 98% 99%  Weight:      Height:        Intake/Output Summary (Last 24 hours) at 09/05/2020 0739 Last data filed at 09/05/2020 0600 Gross per 24 hour  Intake 580 ml  Output 175 ml  Net 405 ml   Filed Weights   08/31/20 2041 09/02/20 0830 09/02/20 1105  Weight: 88.9 kg 88.9 kg 87.5 kg    Examination:  General exam: No acute distress.  Looks chronically ill.  Poor historian.  Intermittently requiring 1 to 2 L nasal cannula oxygen  respiratory system: Bilateral decreased breath sounds at bases with scattered crackles cardiovascular system: S1-S2 heard, rate controlled  gastrointestinal system: Abdomen is slightly distended, soft and dressing present with mild surrounding tenderness.  Ostomy bag present.  extremities: No cyanosis.  Mild lower extremity edema present.  Bilateral feet dressing present  Central nervous system: Awake and oriented.  Slow to respond.  Poor historian.  No focal neurological deficits.  Moving extremities skin: No obvious ecchymosis/rashes psychiatry: Flat affect  Data Reviewed: I have personally reviewed following labs and imaging studies  CBC: Recent Labs  Lab 08/31/20 0159 09/01/20 0400 09/02/20 0406 09/03/20 0502 09/05/20 0319  WBC 11.5* 6.9 6.1 5.4 5.9  NEUTROABS 10.4* 5.5 4.7 3.7 3.9  HGB 10.0* 8.1* 7.2* 7.5* 7.2*  HCT 31.3*  25.0* 22.2* 23.4* 22.2*  MCV 96.3 97.7 96.1 97.5 96.1  PLT 139* 90* 82* 102* 818*   Basic Metabolic Panel: Recent Labs  Lab 08/30/20 0252 08/30/20 1601 08/31/20 0159 09/01/20 0400 09/02/20 0406 09/03/20 0502 09/04/20 0825  NA 142   < > 141 141 137 137 132*  K 4.0   < > 5.2* 4.2 3.3* 3.5 3.7  CL 103   < > 104 101 99 99 94*  CO2 21*  --  15* 24 22 25 23   GLUCOSE 84   < > 224* 127* 103* 117* 136*  BUN 47*   < > 56* 31* 40* 23 35*  CREATININE 7.14*   < > 8.03* 5.52* 6.67* 4.78* 6.94*  CALCIUM 7.7*  --  7.5* 8.1* 7.5* 7.6* 7.6*  MG  --   --   --  2.0  --   --   --   PHOS 8.0*  --   --   --   --   --   --    < > = values in this interval not displayed.   GFR: Estimated Creatinine Clearance: 8.7 mL/min (A) (by C-G formula based on SCr of 6.94 mg/dL (H)). Liver Function Tests: Recent Labs  Lab 08/30/20 0252  ALBUMIN 2.3*   No results for input(s): LIPASE, AMYLASE in the last 168 hours. No results for input(s): AMMONIA in the last 168 hours. Coagulation  Profile: No results for input(s): INR, PROTIME in the last 168 hours. Cardiac Enzymes: No results for input(s): CKTOTAL, CKMB, CKMBINDEX, TROPONINI in the last 168 hours. BNP (last 3 results) No results for input(s): PROBNP in the last 8760 hours. HbA1C: No results for input(s): HGBA1C in the last 72 hours. CBG: Recent Labs  Lab 09/04/20 1119 09/04/20 1610 09/04/20 1922 09/04/20 2120 09/05/20 0641  GLUCAP 155* 141* 124* 165* 112*   Lipid Profile: No results for input(s): CHOL, HDL, LDLCALC, TRIG, CHOLHDL, LDLDIRECT in the last 72 hours. Thyroid Function Tests: No results for input(s): TSH, T4TOTAL, FREET4, T3FREE, THYROIDAB in the last 72 hours. Anemia Panel: No results for input(s): VITAMINB12, FOLATE, FERRITIN, TIBC, IRON, RETICCTPCT in the last 72 hours. Sepsis Labs: No results for input(s): PROCALCITON, LATICACIDVEN in the last 168 hours.  Recent Results (from the past 240 hour(s))  SARS Coronavirus 2 by RT  PCR (hospital order, performed in [facility] hospital lab) Nasopharyngeal Nasopharyngeal Swab     Status: None   Collection Time: 08/27/20  9:46 PM   Specimen: Nasopharyngeal Swab  Result Value Ref Range Status   SARS Coronavirus 2 NEGATIVE NEGATIVE Final    Comment: (NOTE) SARS-CoV-2 target nucleic acids are NOT DETECTED.  The SARS-CoV-2 RNA is generally detectable in upper and lower respiratory specimens during the acute phase of infection. The lowest concentration of SARS-CoV-2 viral copies this assay can detect is 250 copies / mL. A negative result does not preclude SARS-CoV-2 infection and should not be used as the sole basis for treatment or other patient management decisions.  A negative result may occur with improper specimen collection / handling, submission of specimen other than nasopharyngeal swab, presence of viral mutation(s) within the areas targeted by this assay, and inadequate number of viral copies (<250 copies / mL). A negative result must be combined with clinical observations, patient history, and epidemiological information.  Fact Sheet for Patients:   StrictlyIdeas.no  Fact Sheet for Healthcare Providers: BankingDealers.co.za  This test is not yet approved or  cleared by the [place] FDA and has been authorized for detection and/or diagnosis of SARS-CoV-2 by FDA under an Emergency Use Authorization (EUA).  This EUA will remain in effect (meaning this test can be used) for the duration of the COVID-19 declaration under Section 564(b)(1) of the Act, 21 U.S.C. section 360bbb-3(b)(1), unless the authorization is terminated or revoked sooner.  Performed at [facility], [place] [address], [place], [place] [address]   Surgical PCR screen     Status: None   Collection Time: 08/29/20 10:13 PM   Specimen: Nasal Mucosa; Nasal Swab  Result Value Ref Range Status   MRSA, PCR NEGATIVE NEGATIVE  Final   Staphylococcus aureus NEGATIVE NEGATIVE Final    Comment: (NOTE) The Xpert SA Assay (FDA approved for NASAL specimens in patients 61 years of age and older), is one component of a comprehensive surveillance program. It is not intended to diagnose infection nor to guide or monitor treatment. Performed at [facility], [place] [address] [place], [place] [address]          Radiology Studies: No results found.      Scheduled Meds: . acetaminophen  1,000 mg Oral Q6H  . Chlorhexidine Gluconate Cloth  6 each Topical Q0600  . doxercalciferol  3 mcg Intravenous Q T,Th,Sa-HD  . fluticasone furoate-vilanterol  1 puff Inhalation Daily  . gabapentin  300 mg Oral TID  . insulin aspart  0-5 Units Subcutaneous QHS  .  insulin aspart  0-6 Units Subcutaneous TID WC  . mouth rinse  15 mL Mouth Rinse BID  . methocarbamol  1,000 mg Oral Q8H  . midodrine  5 mg Oral Q T,Th,Sa-HD  . multivitamin  1 tablet Oral QHS  . pantoprazole (PROTONIX) IV  40 mg Intravenous Q24H   Continuous Infusions: . sodium chloride 10 mL/hr (08/30/20 1800)  . sodium chloride    . ceFEPime (MAXIPIME) IV 2 g (09/02/20 2025)  . dextrose 50 mL/hr at 08/30/20 1130  . vancomycin Stopped (09/02/20 1104)          Aline August, MD [facility] 09/05/2020, 7:39 AM

## 2020-09-05 NOTE — TOC Progression Note (Signed)
Transition of Care [facility]) - Progression Note    Patient Details  Name: [name] MRN: [id number] Date of Birth: 12-02-1950  [facility] CM/SW Contact  Bartholomew Crews, RN Phone Number: [phone] 09/05/2020, 10:04 AM  Clinical Narrative:     Notified by MD of patient medical readiness to transition to [facility] tomorrow. Covid test requested. Nursing aware. Patient aware. Spoke with Nicole Kindred at [facility] - they can accept her tomorrow and are able to transport her to her normal outpatient hemodialysis center at [facility] TTS 7am (6:45 arrival). Patient for HD today per usual schedule. TOC following for transition needs.   Expected Discharge Plan: [place] Barriers to Discharge: Continued Medical Work up  Expected Discharge Plan and Services Expected Discharge Plan: [place] In-house Referral: Clinical Social Work Discharge Planning Services: CM Consult Post Acute Care Choice: [place] arrangements for the past 2 months: Single Family Home                 DME Arranged: N/A DME Agency: NA       HH Arranged: NA HH Agency: NA         Social Determinants of Health (SDOH) Interventions    Readmission Risk Interventions No flowsheet data found.

## 2020-09-06 ENCOUNTER — Telehealth: Payer: Self-pay | Admitting: Hematology and Oncology

## 2020-09-06 ENCOUNTER — Encounter: Payer: Medicare Other | Admitting: Podiatry

## 2020-09-06 DIAGNOSIS — N184 Chronic kidney disease, stage 4 (severe): Secondary | ICD-10-CM | POA: Diagnosis not present

## 2020-09-06 DIAGNOSIS — E11628 Type 2 diabetes mellitus with other skin complications: Secondary | ICD-10-CM | POA: Diagnosis not present

## 2020-09-06 DIAGNOSIS — K56600 Partial intestinal obstruction, unspecified as to cause: Secondary | ICD-10-CM | POA: Diagnosis not present

## 2020-09-06 DIAGNOSIS — K56609 Unspecified intestinal obstruction, unspecified as to partial versus complete obstruction: Secondary | ICD-10-CM

## 2020-09-06 DIAGNOSIS — L97519 Non-pressure chronic ulcer of other part of right foot with unspecified severity: Secondary | ICD-10-CM | POA: Diagnosis not present

## 2020-09-06 DIAGNOSIS — N186 End stage renal disease: Secondary | ICD-10-CM | POA: Diagnosis not present

## 2020-09-06 DIAGNOSIS — C182 Malignant neoplasm of ascending colon: Secondary | ICD-10-CM | POA: Diagnosis not present

## 2020-09-06 LAB — GLUCOSE, CAPILLARY
Glucose-Capillary: 130 mg/dL — ABNORMAL HIGH (ref 70–99)
Glucose-Capillary: 144 mg/dL — ABNORMAL HIGH (ref 70–99)
Glucose-Capillary: 130 mg/dL — ABNORMAL HIGH (ref 70–99)
Glucose-Capillary: 151 mg/dL — ABNORMAL HIGH (ref 70–99)

## 2020-09-06 LAB — BASIC METABOLIC PANEL
Anion gap: 14 (ref 5–15)
BUN: 28 mg/dL — ABNORMAL HIGH (ref 8–23)
CO2: 22 mmol/L (ref 22–32)
Calcium: 7.5 mg/dL — ABNORMAL LOW (ref 8.9–10.3)
Chloride: 97 mmol/L — ABNORMAL LOW (ref 98–111)
Creatinine, Ser: 5.16 mg/dL — ABNORMAL HIGH (ref 0.44–1.00)
GFR, Estimated: 9 mL/min — ABNORMAL LOW (ref >60)
Glucose, Bld: 146 mg/dL — ABNORMAL HIGH (ref 70–99)
Potassium: 4.3 mmol/L (ref 3.5–5.1)
Sodium: 133 mmol/L — ABNORMAL LOW (ref 135–145)

## 2020-09-06 LAB — CBC WITH DIFFERENTIAL/PLATELET
Abs Immature Granulocytes: 0.21 10*3/uL — ABNORMAL HIGH (ref 0.00–0.07)
Basophils Absolute: 0 10*3/uL (ref 0.0–0.1)
Basophils Relative: 1 %
Eosinophils Absolute: 0.1 10*3/uL (ref 0.0–0.5)
Eosinophils Relative: 1 %
HCT: 22.5 % — ABNORMAL LOW (ref 36.0–46.0)
Hemoglobin: 7.7 g/dL — ABNORMAL LOW (ref 12.0–15.0)
Immature Granulocytes: 3 %
Lymphocytes Relative: 9 %
Lymphs Abs: 0.6 10*3/uL — ABNORMAL LOW (ref 0.7–4.0)
MCH: 32.2 pg (ref 26.0–34.0)
MCHC: 34.2 g/dL (ref 30.0–36.0)
MCV: 94.1 fL (ref 80.0–100.0)
Monocytes Absolute: 0.4 10*3/uL (ref 0.1–1.0)
Monocytes Relative: 6 %
Neutro Abs: 5.1 10*3/uL (ref 1.7–7.7)
Neutrophils Relative %: 80 %
Platelets: 121 10*3/uL — ABNORMAL LOW (ref 150–400)
RBC: 2.39 MIL/uL — ABNORMAL LOW (ref 3.87–5.11)
RDW: 15.7 % — ABNORMAL HIGH (ref 11.5–15.5)
WBC: 6.4 10*3/uL (ref 4.0–10.5)
nRBC: 0.3 % — ABNORMAL HIGH (ref 0.0–0.2)

## 2020-09-06 MED ORDER — DARBEPOETIN ALFA 150 MCG/0.3ML IJ SOSY
150.0000 ug | PREFILLED_SYRINGE | INTRAMUSCULAR | Status: DC
  Filled 2020-09-06: qty 0.3

## 2020-09-06 MED ORDER — TRAZODONE HCL 50 MG PO TABS: 25.0000 mg | ORAL_TABLET | Freq: Every evening | ORAL | Status: AC | PRN

## 2020-09-06 MED ORDER — DOXERCALCIFEROL 4 MCG/2ML IV SOLN: 3.0000 ug | INTRAVENOUS | Status: AC

## 2020-09-06 MED ORDER — MIDODRINE HCL 5 MG PO TABS
5.0000 mg | ORAL_TABLET | ORAL | Status: AC
Start: 2020-09-07 — End: ?

## 2020-09-06 MED ORDER — DOXYCYCLINE HYCLATE 100 MG PO TABS: 100.0000 mg | ORAL_TABLET | Freq: Two times a day (BID) | ORAL | 0 refills | Status: AC

## 2020-09-06 MED ORDER — TRESIBA FLEXTOUCH 100 UNIT/ML ~~LOC~~ SOPN: 20.0000 [IU] | PEN_INJECTOR | Freq: Every day | SUBCUTANEOUS | 0 refills | Status: AC

## 2020-09-06 MED ORDER — INSULIN ASPART 100 UNIT/ML ~~LOC~~ SOLN: 0.0000 [IU] | Freq: Three times a day (TID) | SUBCUTANEOUS | 11 refills | Status: AC

## 2020-09-06 MED ORDER — OXYCODONE HCL 5 MG PO TABS: 5.0000 mg | ORAL_TABLET | Freq: Three times a day (TID) | ORAL | 0 refills | Status: AC | PRN

## 2020-09-06 NOTE — Progress Notes (Signed)
Physical Therapy Treatment Patient Details Name: [name] MRN: [id number] DOB: [birth year] Today's Date: 09/06/2020    History of Present Illness Pt is a 70 y.o. female admitted 08/27/20 with nausea/vomiting and malaise, missed HD due to feeling poorly and winter weather. CT with small and large bowel obstruction secondary to ascending mass in RLQ, concerning for metastatic process. S/p hemicolectomy, ileostomy 2/9. 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 PMH includes CA, CKD, DM, HTN, toe amputations.    PT Comments    Pt received in bed, cooperative and pleasant. Reported minimal nausea and that RN gave her medicine for it this morning. Pt was mod Ax1 for sit to stand, requiring 2 attempts and bed elevated. Min guard for transfers and min Ax1 for ambulation. After ambulating to chair and sitting, pt BP dropped to 66/44 with increased nausea and "swimminess" feeling. Pt returned to bed via stand pivot transfer with RW. BP rose to 87/50 in supine with HOB flat. SPO2 stable throughout treatment. Some slurred speech after sitting in chair but subsided after pt was moved to supine in bed. No evidence of change in cognition. A&Ox4 at end of treatment. Pt left in bed with all needs met, RN aware of status, and NT present in room.    Follow Up Recommendations  SNF;Supervision for mobility/OOB     Equipment Recommendations  Rolling walker with 5" wheels;3in1 (PT);Wheelchair (measurements PT);Wheelchair cushion (measurements PT);Hospital bed;Other (comment) (hoyer lift)    Recommendations for Other Services       Precautions / Restrictions Precautions Precautions: Fall;Other (comment) Precaution Comments: osteomy R LQ, R 3rd toe amputation, L 3rd toe arthroplasty, abdominal incision Restrictions Weight Bearing Restrictions: Yes RLE Weight Bearing: Weight bearing as tolerated LLE Weight Bearing: Weight bearing as tolerated    Mobility  Bed  Mobility Overal bed mobility: Needs Assistance Bed Mobility: Supine to Sit     Supine to sit: Min assist;HOB elevated Sit to supine: Supervision   General bed mobility comments: HOB elevated and use of bedrails. Min A to bring trunk forward    Transfers Overall transfer level: Needs assistance Equipment used: Rolling walker (2 wheeled) Transfers: Sit to/from [facility] Sit to Stand: Mod assist;From elevated surface Stand pivot transfers: Min guard       General transfer comment: Mod A for sit to stand, elevated HOB, needed two attempts to get into upright position  Ambulation/Gait Ambulation/Gait assistance: Min assist   Assistive device: Rolling walker (2 wheeled) Gait Pattern/deviations: Step-to pattern;Decreased step length - right;Decreased step length - left;Trunk flexed Gait velocity: Decreased   General Gait Details: Min A for navigation of RW   Stairs             Wheelchair Mobility    Modified Rankin (Stroke Patients Only)       Balance Overall balance assessment: Needs assistance Sitting-balance support: Feet supported;Bilateral upper extremity supported Sitting balance-Leahy Scale: Fair     Standing balance support: Bilateral upper extremity supported;During functional activity Standing balance-Leahy Scale: Poor Standing balance comment: Reliant on B UE support in standing                            Cognition Arousal/Alertness: Awake/alert Behavior During Therapy: Flat affect Overall Cognitive Status: No family/caregiver present to determine baseline cognitive functioning Area of Impairment: Orientation;Attention;Memory;Following commands;Safety/judgement;Awareness;Problem solving  Current Attention Level: Sustained Memory: Decreased short-term memory;Decreased recall of precautions Following Commands: Follows one step commands with increased time;Follows multi-step commands  inconsistently Safety/Judgement: Decreased awareness of safety;Decreased awareness of deficits Awareness: Intellectual Problem Solving: Slow processing;Decreased initiation;Difficulty sequencing;Requires verbal cues;Requires tactile cues General Comments: Pt able to follow simple commands with increased time, but decreased problem solving, awareness of deficits, and attention. Generally responds to questions appropriately but with tangential answers. Pt A&Ox4      Exercises      General Comments General comments (skin integrity, edema, etc.): SPO2 stable on RA. BP dropped to 66/44 following ambulation to chair with increase in nause and swimmy feeling. Stand pivot transfer from chair to bed. BP rose to 87/50 supine in bed with HOB flat. Some slurred speech after sitting in chair, no evidence of slurred speech once pt was supine in bed      Pertinent Vitals/Pain Pain Assessment: Faces Faces Pain Scale: Hurts even more Pain Location: Nausea, abdominal incision Pain Descriptors / Indicators: Grimacing;Sore;Discomfort Pain Intervention(s): Limited activity within patient's tolerance;Monitored during session    Home Living                      Prior Function            PT Goals (current goals can now be found in the care plan section)      Frequency    Min 3X/week      PT Plan Current plan remains appropriate    Co-evaluation              AM-PAC PT "6 Clicks" Mobility   Outcome Measure  Help needed turning from your back to your side while in a flat bed without using bedrails?: A Little Help needed moving from lying on your back to sitting on the side of a flat bed without using bedrails?: A Little Help needed moving to and from a bed to a chair (including a wheelchair)?: A Little Help needed standing up from a chair using your arms (e.g., wheelchair or bedside chair)?: A Lot Help needed to walk in hospital room?: A Little Help needed climbing 3-5 steps  with a railing? : A Lot 6 Click Score: 16    End of Session Equipment Utilized During Treatment: Gait belt Activity Tolerance: Patient tolerated treatment well Patient left: in bed;with nursing/sitter in room Nurse Communication: Mobility status PT Visit Diagnosis: Unsteadiness on feet (R26.81);Difficulty in walking, not elsewhere classified (R26.2);Muscle weakness (generalized) (M62.81);Other abnormalities of gait and mobility (R26.89);Pain Pain - Right/Left:  (abdominal) Pain - part of body:  (abdominal)     Time:  -     Charges:                        Rosita Kea, SPT

## 2020-09-06 NOTE — Progress Notes (Signed)
Pharmacy Antibiotic Note  [name] is a 70 y.o. female admitted on 08/27/2020 with abd pain and found to have bowel obstruction.  Now s/o ex-lap 2/9 with hemicolectomy and ostomy placement.  Pt was on antibiotics PTA for cellulitis / toe ulcer and dog bite to L hand.  Pharmacy was consulted 2/9 for Vancomycin and Cefepime dosing  Pt has ESRD with usual HD on TTS now back on usual TTS schedule.  Last HD 2/15.  S/p left 3rd toe amputation 2/14. Podiatrist noted that surgical margins appeared very healthy and viable and recommended a short course of post discharge oral antibiotics with oral doxycycline 100 mg 2 times daily #14.    Plan: No change in current doses of  Cefepime to 2gm IV qTTS and  Vancomycin1000mg IV x qHD TTS Consider change antibiotics today to oral Doxycycline as Dr. Amalia Hailey recommended.   IF Vancomycin continues will nee to check vanc level pre-HD with next HD.    > have not ordered pre-HD level for 2/17 in anticipation of abx stopping after amputation  Height: 5\' 7"  (170.2 cm) Weight: 90.5 kg (199 lb 8.3 oz) IBW/kg (Calculated) : 61.6  Temp (24hrs), Avg:98.3 F (36.8 C), Min:97.6 F (36.4 C), Max:98.7 F (37.1 C)  Recent Labs  Lab 09/01/20 0400 09/02/20 0406 09/03/20 0502 09/04/20 0825 09/05/20 0319 09/06/20 0123  WBC 6.9 6.1 5.4  --  5.9 6.4  CREATININE 5.52* 6.67* 4.78* 6.94*  --  5.16*    Estimated Creatinine Clearance: 11.9 mL/min (A) (by C-G formula based on SCr of 5.16 mg/dL (H)).    Allergies  Allergen Reactions  . Latex Itching  . Penicillins Other (See Comments)    UNSPECIFIED CHILDHOOD REACTION  Has patient had a PCN reaction causing immediate rash, facial/tongue/throat swelling, SOB or lightheadedness with hypotension: Unknown Has patient had a PCN reaction causing severe rash involving mucus membranes or skin necrosis: Unknown Has patient had a PCN reaction that required hospitalization: Unknown Has patient had a PCN reaction occurring  within the last 10 years: Unknown If all of the above answers are "NO", then may proceed with Cephalosporin use.   . Adhesive [Tape] Rash    Antimicrobials this admission: Vanc 2/6 >> Cefepime 2/7 >>  Dose adjustments this admission:   Microbiology results: 2/6 covid: neg 2/8 MRSA PCR: negative   Thank you for allowing pharmacy to be a part of this patient's care.  Dimple Nanas, PharmD PGY-1 Acute Care Pharmacy Resident Office: [phone] 09/06/2020 11:40 AM

## 2020-09-06 NOTE — Consult Note (Signed)
   [facility] Inpatient Consult   09/06/2020  [name] 1950-08-21 [id number]  [facility] [employer] [ACO] Patient: [name]  Reviewed for high risk score and length of stay for disposition needs.  Patient is for SNF, progress notes reviewed for [facility] team notes. No current [facility] Care Management needs as patient's need to be met at a skilled nursing facility.  For questions,  Natividad Brood, RN BSN [facility] Liaison  [phone] business mobile phone Toll free office [phone]  Fax number: [phone] [email or web address]

## 2020-09-06 NOTE — Discharge Summary (Addendum)
Physician Discharge Summary  [name] KGM:[id number] DOB: [birth year] DOA: 08/27/2020  PCP: [name], MD  Admit date: 08/27/2020 Discharge date: 09/07/2020  Time spent: 50 minutes  Recommendations for Outpatient Follow-up:  1. Follow-up podiatry in 2 weeks 2. Follow-up general surgery on 10/05/2020 3. 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)   Colonic mass   ESRD on hemodialysis ([place])   Benign neoplasm of colon   Malignant neoplasm of ascending colon [facility])   Discharge Condition: Stable  Diet recommendation: Carb modified diet  Filed Weights   09/05/20 1138 09/05/20 1413 09/07/20 0703  Weight: 92.2 kg 90.5 kg 90.3 kg    History of present illness:  As per HPI 70 year old female history of asthma, anemia, renal cell carcinoma status post nephrectomy, type 2 diabetes mellitus, end-stage renal disease on hemodialysis Tuesday Thursday Saturday, peripheral vascular disease, hypertension, hyperlipidemia, GERD, diabetic retinopathy as well as neuropathy presented to the ED with acute abdominal pain, intractable nausea and vomiting to liquids and solids. Patient noted to have missed hemodialysis on Tuesday and Saturday prior to admission. Patient also noted to have been scheduled for amputation of the right third toe per podiatry. In the ED: work-up with CT abdomen and pelvis revealed small and large bowel obstruction secondary to previously demonstrated colonic mass in the ascending colon. NG tube placed. Nephrology consulted. GI consulted as well as general  surgery.  She underwent colonoscopy on 08/29/2020 which showed likely malignant tumor which was biopsied.  She underwent surgical intervention on 08/30/2020.  PT recommended SNF placement.  She underwent surgical intervention by podiatry on 09/04/2020.  Hospital Course:  Small and large bowel obstruction-secondary to right ascending colon mass, new diagnosis of chronic adenocarcinoma.  She is status post colonoscopy on 08/29/2020 by GI which showed likely malignant tumor, it was biopsied, also removed 3 to 8 mm polyps.  Pathology showed chronic adenocarcinoma.  Patient is s/p expiratory laparotomy, right hemicolectomy, ileostomy creation on 08/30/2020.  NG tube was inserted which has been removed.  CT chest showed lingular nodule, slowly nursing and recommended surveillance.  Oncology was consulted, at this time oncology wants to consider chemotherapy.  Patient will follow up with oncology Dr. Lorenso Courier in 2 weeks.  Palliative care was consulted and patient is DNR.  End-stage renal disease-on hemodialysis.  Patient is on hemodialysis TTS.  She received hemodialysis yesterday.  Continue hemodialysis as outpatient.  Diabetes mellitus type 2-she has diabetic neuropathy and retinopathy.  Hemoglobin A1c is 7.8.  Will cut on the dose of Tresiba to 20 units subcu daily.  Continue sliding scale insulin with NovoLog.  Bilateral third toe cellulitis/ulcer with chronic osteomyelitis-followed by podiatry Dr. Amalia Hailey for possible amputation during this admission.  Patient was started on empiric vancomycin and cefepime.  She is s/p third digit amputation arthroplasty of third digit left foot by podiatry, Dr. Amalia Hailey on 09/04/2020.  Podiatry recommends sending home on doxycycline 1 tablet p.o. twice daily for 7 days.  Anemia of chronic disease-hemoglobin stable at 7.7.  Thrombocytopenia-no signs of bleeding.  Hyperlipidemia-statin resumed.  Procedures: Colonoscopy on 08/29/2020  Right hemicolectomy on 08/30/2020  right third digit  amputation and arthroplasty of third digit left foot by podiatry/Dr. Amalia Hailey on 09/04/2020.  Consultations:  General surgery Podiatry Nephrology  Discharge Exam: Vitals:   09/07/20 0729 09/07/20 0800  BP: (!) 83/60 (!) 90/50  Pulse: 69 71  Resp:    Temp:    SpO2:      General: Appears in no acute distress Cardiovascular: S1-S2, regular Respiratory: Clear to auscultation bilaterally  Discharge Instructions   Discharge Instructions    Diet - low sodium heart healthy   Complete by: As directed    Discharge wound care:   Complete by: As directed    Keep dressings clean dry and intact until follow-up in the office   Increase activity slowly   Complete by: As directed      Allergies as of 09/07/2020      Reactions   Latex Itching   Penicillins Other (See Comments)   UNSPECIFIED CHILDHOOD REACTION  Has patient had a PCN reaction causing immediate rash, facial/tongue/throat swelling, SOB or lightheadedness with hypotension: Unknown Has patient had a PCN reaction causing severe rash involving mucus membranes or skin necrosis: Unknown Has patient had a PCN reaction that required hospitalization: Unknown Has patient had a PCN reaction occurring within the last 10 years: Unknown If all of the above answers are "NO", then may proceed with Cephalosporin use.   Adhesive [tape] Rash      Medication List    STOP taking these medications   linezolid 600 MG tablet Commonly known as: ZYVOX     TAKE these medications   Breo Ellipta 100-25 MCG/INH Aepb Generic drug: fluticasone furoate-vilanterol Inhale 1 puff by mouth once daily What changed: See the new instructions.   diphenhydrAMINE 25 MG tablet Commonly known as: BENADRYL Take 25 mg by mouth daily as needed for allergies or itching.   doxercalciferol 4 MCG/2ML injection Commonly known as: HECTOROL Inject 1.5 mLs (3 mcg total) into the vein Every Tuesday,Thursday,and Saturday with dialysis.   doxycycline 100 MG  tablet Commonly known as: VIBRA-TABS Take 1 tablet (100 mg total) by mouth 2 (two) times daily for 7 days.   gabapentin 300 MG capsule Commonly known as: NEURONTIN TAKE 1 CAPSULE BY MOUTH THREE TIMES DAILY. REQUIRES OFFICE VISIT BEFORE ANY FURTHER REFILLS CAN BE GIVEN What changed: See the new instructions.   insulin aspart 100 UNIT/ML injection Commonly known as: novoLOG Inject 0-6 Units into the skin 3 (three) times daily with meals. Sliding scale insulin Less than 70 initiate hypoglycemia protocol 70-120  0 units 120-150 0 unit 151-200 1 units 201-250 2 units 251-300 3 units 301-350 4 units 351-400 5 units Greater than 400 call MD and give 6 units   Live Better Pen Needles 31G X 6 MM Misc Generic drug: Insulin Pen Needle To use with Tresiba pens daily   midodrine 5 MG tablet Commonly known as: PROAMATINE Take 1 tablet (5 mg total) by mouth Every Tuesday,Thursday,and Saturday with dialysis.   multivitamin Tabs tablet Take 1 tablet by mouth daily.   ONE TOUCH ULTRA 2 w/Device Kit Use to check BS BID-QID Dx:E11.9   OneTouch Ultra test strip Generic drug: glucose blood USE AS DIRECTED TO MONITOR  FSBS 3 TIMES DAILY   onetouch ultrasoft lancets Use as instructed   oxyCODONE 5 MG immediate release tablet Commonly known as: Roxicodone Take 1 tablet (5 mg total) by mouth every 8 (eight) hours as needed for up to 10 doses.   ProAir HFA  108 (90 Base) MCG/ACT inhaler Generic drug: albuterol INHALE 2 PUFFS BY MOUTH EVERY 6 HOURS AS NEEDED FOR WHEEZING FOR SHORTNESS OF BREATH What changed: See the new instructions.   rosuvastatin 20 MG tablet Commonly known as: CRESTOR Take 1 tablet (20 mg total) by mouth daily.   Santyl ointment Generic drug: collagenase Apply 1 application topically daily as needed (wound care).   THERATEARS OP Place 1 drop into both eyes daily as needed (dry eyes).   traZODone 50 MG tablet Commonly known as: DESYREL Take 0.5 tablets (25 mg total) by  mouth at bedtime as needed for sleep.   Tyler Aas FlexTouch 100 UNIT/ML FlexTouch Pen Generic drug: insulin degludec Inject 20 Units into the skin daily. Hold if fasting blood sugars are <130. What changed: how much to take   TUMS PO Take 1 tablet by mouth 3 (three) times daily with meals.            Discharge Care Instructions  (From admission, onward)         Start     Ordered   09/06/20 0000  Discharge wound care:       Comments: Keep dressings clean dry and intact until follow-up in the office   09/06/20 1450         Allergies  Allergen Reactions  . Latex Itching  . Penicillins Other (See Comments)    UNSPECIFIED CHILDHOOD REACTION  Has patient had a PCN reaction causing immediate rash, facial/tongue/throat swelling, SOB or lightheadedness with hypotension: Unknown Has patient had a PCN reaction causing severe rash involving mucus membranes or skin necrosis: Unknown Has patient had a PCN reaction that required hospitalization: Unknown Has patient had a PCN reaction occurring within the last 10 years: Unknown If all of the above answers are "NO", then may proceed with Cephalosporin use.   . Adhesive [Tape] Rash    Contact information for follow-up providers    Jesusita Oka, MD. Go on 10/05/2020.   Specialty: Surgery Why: 1030am. Please arrive 30 minutes prior to your appointment for paperwork. Please bring a copy of your photo ID and insurace card.  Contact information: [place] [address] [phone]        Edrick Kins, DPM. Schedule an appointment as soon as possible for a visit in 2 week(s).   Specialty: Podiatry Contact information: [address] [place] [address] [phone]        Orson Slick, MD. Schedule an appointment as soon as possible for a visit in 2 week(s).   Specialty: Hematology and Oncology Contact information: [place] [address] [phone]             Contact information for after-discharge care    Destination    [facility] Preferred SNF .   Service: Skilled Nursing Contact information: [place] [phone]                   The results of significant diagnostics from this hospitalization (including imaging, microbiology, ancillary and laboratory) are listed below for reference.    Significant Diagnostic Studies: CT ABDOMEN PELVIS WO CONTRAST  Result Date: 08/27/2020 CLINICAL DATA:  Nephrectomy for right-sided renal cell carcinoma. Concern for bowel obstruction. EXAM: CT ABDOMEN AND PELVIS WITHOUT CONTRAST TECHNIQUE: Multidetector CT imaging of the abdomen and pelvis was performed following the standard protocol without IV contrast. COMPARISON:  June 28, 2020 FINDINGS: Lower  chest: The lung bases are clear. The heart size is normal. Hepatobiliary: The liver is normal. Normal gallbladder.There is no biliary ductal dilation. Pancreas: Normal contours without ductal dilatation. No peripancreatic fluid collection. Spleen: Unremarkable. Adrenals/Urinary Tract: --Adrenal glands: Unremarkable. --Right kidney/ureter: The right kidney is surgically absent. --Left kidney/ureter: The left kidney appears atrophic. --Urinary bladder: Unremarkable. Stomach/Bowel: --Stomach/Duodenum: No hiatal hernia or other gastric abnormality. Normal duodenal course and caliber. --Small bowel: There are mildly dilated loops of small bowel scattered throughout the abdomen to the level of the patient's ileocecal valve. --Colon: There is moderate distention of the cecum. There is an above transition point at the level of the ascending colon with there is a probable underlying colonic mass. The remaining portions of the patient's colon are completely decompressed aside from the rectum. --Appendix: Normal. Vascular/Lymphatic: Atherosclerotic calcification is present within the non-aneurysmal abdominal  aorta, without hemodynamically significant stenosis. --No retroperitoneal lymphadenopathy. --there are few mildly enlarged mesenteric lymph nodes in the right lower quadrant and right mid abdomen. --No pelvic or inguinal lymphadenopathy. Reproductive: Unremarkable Other: No ascites or free air. The abdominal wall is normal. Musculoskeletal. No acute displaced fractures. IMPRESSION: 1. Small and large bowel obstruction that appears to be secondary to the previously demonstrated colonic mass in the ascending colon. 2. There are few mildly enlarged mesenteric lymph nodes in the right lower quadrant and right mid abdomen, concerning for nodal metastatic disease. 3. Status post right nephrectomy. 4. Atrophic left kidney. Aortic Atherosclerosis (ICD10-I70.0). Electronically Signed   By: Constance Holster M.D.   On: 08/27/2020 19:51   DG Abdomen 1 View  Result Date: 08/28/2020 CLINICAL DATA:  Nasogastric tube placement. EXAM: ABDOMEN - 1 VIEW COMPARISON:  CT abdomen and pelvis 08/27/2020 FINDINGS: The tip of the nasogastric tube is near the GE junction. There is gas in the stomach. Gas within bowel loops in the mid abdomen and left lower abdomen. Limited evaluation for free air on the supine image. IMPRESSION: 1. Nasogastric tube tip is near the GE junction. 2. Persistent gas-filled loops of bowel in the abdomen. Findings remain compatible with a bowel obstruction. Electronically Signed   By: Markus Daft M.D.   On: 08/28/2020 07:49   CT CHEST W CONTRAST  Result Date: 08/29/2020 CLINICAL DATA:  Staging workup of ascending colon cancer. Prior right renal cell carcinoma. EXAM: CT CHEST WITH CONTRAST TECHNIQUE: Multidetector CT imaging of the chest was performed during intravenous contrast administration. CONTRAST:  87m OMNIPAQUE IOHEXOL 300 MG/ML  SOLN COMPARISON:  CT abdomen 08/27/2020 and CT chest 06/28/2020 FINDINGS: Cardiovascular: Coronary, aortic arch, and branch vessel atherosclerotic vascular disease. Mildly  prominent right ventricle and overall mild cardiomegaly. Mitral and aortic valve calcifications. Mediastinum/Nodes: Lower paratracheal node anterior to the carina measures 2.1 cm in short axis on image 57 series 3, previously the same by my measurements. Right hilar lymph node measures 1.2 cm in short axis on image 62 of series 3, previously likely similar on 06/28/2020 although difficult to measure on that exam due to the lack of IV contrast. A nasogastric tube is present and terminates within a type 1 hiatal hernia. Lungs/Pleura: Small right and trace left pleural effusions. Mild scarring or atelectasis in the lingula and lower lobes. 4 by 5 mm lingular nodule on image 82 series 4, measuring 0.3 cm in diameter on 11/04/2018 and 05/13/2019 and 0.4 by 0.3 cm on 12/01/2019. Accordingly this nodule is slowly enlarging and merits surveillance, but is still too small for PET CT characterization. Upper Abdomen: Perihepatic ascites.  Left adrenalectomy. Atherosclerosis. Musculoskeletal: Degenerative glenohumeral arthropathy bilaterally. Thoracic spondylosis. IMPRESSION: 1. 4 by 5 mm lingular nodule, slowly enlarging over the last 2 years, but still too small for PET CT characterization. This could be inflammatory or neoplastic. Surveillance is likely warranted. 2. Stable paratracheal and right hilar adenopathy. 3. Small right and trace left pleural effusions. 4. Other imaging findings of potential clinical significance: Coronary, aortic arch, and branch vessel atherosclerotic vascular disease. Mildly prominent right ventricle and overall mild cardiomegaly. Mitral and aortic valve calcifications. Perihepatic ascites. Left adrenalectomy. Degenerative glenohumeral arthropathy bilaterally. 5. Aortic atherosclerosis. Aortic Atherosclerosis (ICD10-I70.0). Electronically Signed   By: Van Clines M.D.   On: 08/29/2020 19:22   DG Foot Complete Left  Result Date: 08/10/2020 CLINICAL DATA:  Nonhealing foot wounds EXAM:  LEFT FOOT - COMPLETE 3+ VIEW COMPARISON:  04/05/2020 FINDINGS: Changes consistent with prior amputation of the first and second toe stable in appearance from the prior exam. Flattening of the plantar arch is noted. Tarsal degenerative changes are seen. Calcaneal spurring is noted. Chronic regularity at the third MTP joint is noted stable from the prior exam. No bony erosive changes are identified to suggest osteomyelitis. IMPRESSION: Postsurgical changes. No erosive changes to suggest osteomyelitis are seen. Electronically Signed   By: Inez Catalina M.D.   On: 08/10/2020 21:23   DG Foot Complete Right  Result Date: 08/10/2020 CLINICAL DATA:  Foot pain and nonhealing wounds EXAM: RIGHT FOOT COMPLETE - 3+ VIEW COMPARISON:  11/15/2019 FINDINGS: Changes of amputation of the first and second digits are again seen and stable. Healed fracture of the second metatarsal distally is seen. Generalized osteopenia is noted. Flattening of the plantar arch and tarsal degenerative changes are seen. Air is noted within the soft tissues in the previous amputation site consistent with the given clinical history of nonhealing wound. No definitive erosive changes to suggest osteomyelitis are noted. IMPRESSION: Chronic bony changes without acute abnormality. Soft tissue gas is noted consistent with the nonhealing wound. No definitive osteomyelitis is seen. Electronically Signed   By: Inez Catalina M.D.   On: 08/10/2020 21:25    Microbiology: Recent Results (from the past 240 hour(s))  Surgical PCR screen     Status: None   Collection Time: 08/29/20 10:13 PM   Specimen: Nasal Mucosa; Nasal Swab  Result Value Ref Range Status   MRSA, PCR NEGATIVE NEGATIVE Final   Staphylococcus aureus NEGATIVE NEGATIVE Final    Comment: (NOTE) The Xpert SA Assay (FDA approved for NASAL specimens in patients 85 years of age and older), is one component of a comprehensive surveillance program. It is not intended to diagnose infection nor  to guide or monitor treatment. Performed at [facility], [place] [address] [place], [place] [address]   SARS CORONAVIRUS 2 (TAT 6-24 HRS) Nasopharyngeal Nasopharyngeal Swab     Status: None   Collection Time: 09/05/20  9:55 AM   Specimen: Nasopharyngeal Swab  Result Value Ref Range Status   SARS Coronavirus 2 NEGATIVE NEGATIVE Final    Comment: (NOTE) SARS-CoV-2 target nucleic acids are NOT DETECTED.  The SARS-CoV-2 RNA is generally detectable in upper and lower respiratory specimens during the acute phase of infection. Negative results do not preclude SARS-CoV-2 infection, do not rule out co-infections with other pathogens, and should not be used as the sole basis for treatment or other patient management decisions. Negative results must be combined with clinical observations, patient history, and epidemiological information. The expected result is Negative.  Fact Sheet for Patients:  SugarRoll.be  Fact Sheet for Healthcare Providers: https://www.woods-mathews.com/  This test is not yet approved or cleared by the [place] FDA and  has been authorized for detection and/or diagnosis of SARS-CoV-2 by FDA under an Emergency Use Authorization (EUA). This EUA will remain  in effect (meaning this test can be used) for the duration of the COVID-19 declaration under Se ction 564(b)(1) of the Act, 21 U.S.C. section 360bbb-3(b)(1), unless the authorization is terminated or revoked sooner.  Performed at [facility], [place] [address], [place], [place] [address]      Labs: Basic Metabolic Panel: Recent Labs  Lab 09/01/20 0400 09/02/20 0406 09/03/20 0502 09/04/20 0825 09/06/20 0123 09/07/20 0733  NA 141 137 137 132* 133* 132*  K 4.2 3.3* 3.5 3.7 4.3 4.4  CL 101 99 99 94* 97* 96*  CO2 24 22 25 23 22  21*  GLUCOSE 127* 103* 117* 136* 146* 115*  BUN 31* 40* 23 35* 28* 41*  CREATININE 5.52* 6.67* 4.78* 6.94* 5.16* 7.14*   CALCIUM 8.1* 7.5* 7.6* 7.6* 7.5* 8.0*  MG 2.0  --   --   --   --   --   PHOS  --   --   --   --   --  5.6*   Liver Function Tests: Recent Labs  Lab 09/07/20 0733  ALBUMIN 1.9*   No results for input(s): LIPASE, AMYLASE in the last 168 hours. No results for input(s): AMMONIA in the last 168 hours. CBC: Recent Labs  Lab 09/01/20 0400 09/02/20 0406 09/03/20 0502 09/05/20 0319 09/06/20 0123 09/07/20 0737  WBC 6.9 6.1 5.4 5.9 6.4 6.8  NEUTROABS 5.5 4.7 3.7 3.9 5.1  --   HGB 8.1* 7.2* 7.5* 7.2* 7.7* 7.6*  HCT 25.0* 22.2* 23.4* 22.2* 22.5* 23.3*  MCV 97.7 96.1 97.5 96.1 94.1 97.5  PLT 90* 82* 102* 114* 121* 148*   Cardiac Enzymes: No results for input(s): CKTOTAL, CKMB, CKMBINDEX, TROPONINI in the last 168 hours. BNP: BNP (last 3 results) No results for input(s): BNP in the last 8760 hours.  ProBNP (last 3 results) No results for input(s): PROBNP in the last 8760 hours.  CBG: Recent Labs  Lab 09/06/20 0714 09/06/20 1235 09/06/20 1706 09/06/20 2126 09/07/20 0633  GLUCAP 130* 144* 130* 151* 105*       Signed:  Oswald Hillock MD.  [facility] 09/07/2020, 9:00 AM

## 2020-09-06 NOTE — Progress Notes (Signed)
Navigator informed that [facility] will be here too late today to pick up patient in order for SNF admission by 6pm. Navigator attempted to reschedule patient for Friday (instead of tomorrow) for HD so she can discharge in the morning. [facility] able to accommodate, but SNF cannot transport. No other means of transportation to SNF tonight to get her there by 6pm per CSW. Navigator updated Renal PA. No other option but for her to have HD here tomorrow and then discharge. Navigator updated outpatient clinic.  Alphonzo Cruise, [facility] Navigator [phone]

## 2020-09-06 NOTE — Care Management Important Message (Signed)
Important Message  Patient Details  Name: [name] MRN: [id number] Date of Birth: 11/27/1950   Medicare Important Message Given:  Yes     Samson Ralph P Marlia Schewe 09/06/2020, 3:00 PM

## 2020-09-06 NOTE — TOC Progression Note (Signed)
Transition of Care [facility]) - Progression Note    Patient Details  Name: [name] MRN: [id number] Date of Birth: 1951-02-21  Transition of Care [facility]) CM/SW Contact  Sharlet Salina Mila Homer, LCSW Phone Number: 09/06/2020, 5:22 PM  Clinical Narrative:  CSW received insurance authorization from [place] with Towanda MalkinJosem Kaufmann #[id number], eff. 2/16 for three day (2/16 - 2/18). Blenda Peals is inpatient coordinator and the fax number for continued stay reviews is [phone].  Nicole Kindred, admissions director at [facility] contacted and provided with auth information. Ambulance transport arranged with [facility] and CSW advised by dispatcher that patient would arrive at facility after 9 pm. CSW and renal navigator, Jaclyn Shaggy attempted to work out ways to get patient to SNF before their 6 pm cut-off without success. Patient will go to dialysis tomorrow at [facility] first shift and transport will be arranged to transport patient soon after she returns to her room from dialysis. Nicole Kindred aware and will work with CSW to accept patient tomorrow.  Sister-Cindy Kirt contacted [phone]) and informed that patient will discharge on Thursday to [place].    Expected Discharge Plan: Skilled Nursing Facility Barriers to Discharge: Continued Medical Work up  Expected Discharge Plan and Services Expected Discharge Plan: [place] In-house Referral: Clinical Social Work Discharge Planning Services: CM Consult Post Acute Care Choice: [place] Living arrangements for the past 2 months: Single Family Home Expected Discharge Date: 09/06/20               DME Arranged: N/A DME Agency: NA       HH Arranged: NA HH Agency: NA        Social Determinants of Health (SDOH) Interventions    Readmission Risk Interventions No flowsheet data found.

## 2020-09-06 NOTE — Telephone Encounter (Signed)
Called Mrs. Amily Depp' son Lennette Bihari to discuss the results of the pathological review and the steps moving forward.  Discussed that she has a stage IV colon cancer and would be a candidate for systemic therapy.  Discussed prognosis and steps moving forward.  He voices understanding.  We will plan to see him and his mother back in approximately 1 to 2 weeks in order to discuss this further.  Ledell Peoples, MD Department of Hematology/Oncology [place] at [facility] Phone: [phone] Pager: [phone] Email: [name]

## 2020-09-06 NOTE — Consult Note (Addendum)
[place] Nurse ostomy follow up Patient receiving care in [facility] 5M14 Stoma type/location: RLQ Ileostomy Stomal assessment/size: red budded 1 1/4" Peristomal assessment: Intact skin surrounding the stoma. Output: small amount of thin brown stool. Pouch appeared to have just been emptied Ostomy pouching: 2pc. Pouch Kellie Simmering # [phone]) Skin barrier Kellie Simmering # 2) Barrier ring Kellie Simmering # [phone]) Education provided: Educated patient on emptying when 1/3 full and letting gas out to prevent pouch from exploding. The patient was able to participate in pouch change by opening and closing clean pouch. She removed the pouch and skin barrier with assistance. Talked with her about using only water to clean around the stoma. She cleaned the skin around the stoma. She did not have enough strength in her hand to cut the skin barrier to size. She placed the pouch over the stoma. Asked her how many times a week she should change her pouch. She said 1-2 times a week. Patient states that she will be discharged to a rehab facility today. States that she could change the pouch with assistance if she had to but is not really wanting to do it. If patient is not discharged today, will follow up and see her again on Monday.   Enrolled patient in [facility] Discharge program: Yes  2 piece pouching system   Fecal pouches , [place] #[phone]              Skin Nils Pyle # 2     Kizzie Bane, Kellie Simmering # [place]. Tamala Julian, MSN, RN, [place], Lysle Pearl, [facility] Wound Treatment Associate Pager [phone]

## 2020-09-06 NOTE — Progress Notes (Addendum)
[facility] Progress Note   Subjective:   Patient seen and examined at bedside.  States dialysis went well.  Issues with n/v over last 24 hours.  Able to hold down breakfast so far today but feeling nauseated.  Reports Heme/Onc informed her she has stage 4 kidney and colon cancer.  Would like to try whatever therapy available to see id she tolerates.  Provided support.    Objective Vitals:   09/05/20 1800 09/05/20 2131 09/06/20 0544 09/06/20 0807  BP: 100/60 (!) 91/54 (!) 95/57   Pulse: 80 80 78   Resp: 18  17   Temp: 98.7 F (37.1 C) 98.5 F (36.9 C) 98 F (36.7 C)   TempSrc: Oral Oral Oral   SpO2: 100% 96% 94% 94%  Weight:      Height:       Physical Exam General:chronically ill appearing, alert female in NAD Heart:RRR, no mrg Lungs:CTAB, nml WOB Abdomen:soft, midline staples intact with no erythema or drainage, +ostomy in RLQ with brown liquid stool present Extremities: trace edema in b/l LE, feet dressed Dialysis Access: RU AVF +b/t   Filed Weights   09/02/20 1105 09/05/20 1138 09/05/20 1413  Weight: 87.5 kg 92.2 kg 90.5 kg    Intake/Output Summary (Last 24 hours) at 09/06/2020 1034 Last data filed at 09/06/2020 0550 Gross per 24 hour  Intake 776.87 ml  Output 1654 ml  Net -877.13 ml    Additional Objective Labs: Basic Metabolic Panel: Recent Labs  Lab 09/03/20 0502 09/04/20 0825 09/06/20 0123  NA 137 132* 133*  K 3.5 3.7 4.3  CL 99 94* 97*  CO2 25 23 22   GLUCOSE 117* 136* 146*  BUN 23 35* 28*  CREATININE 4.78* 6.94* 5.16*  CALCIUM 7.6* 7.6* 7.5*   CBC: Recent Labs  Lab 09/01/20 0400 09/02/20 0406 09/03/20 0502 09/05/20 0319 09/06/20 0123  WBC 6.9 6.1 5.4 5.9 6.4  NEUTROABS 5.5 4.7 3.7 3.9 5.1  HGB 8.1* 7.2* 7.5* 7.2* 7.7*  HCT 25.0* 22.2* 23.4* 22.2* 22.5*  MCV 97.7 96.1 97.5 96.1 94.1  PLT 90* 82* 102* 114* 121*   CBG: Recent Labs  Lab 09/04/20 2120 09/05/20 0641 09/05/20 1734 09/05/20 2116 09/06/20 0714  GLUCAP  165* 112* 163* 177* 130*    Medications: . sodium chloride 10 mL/hr (08/30/20 1800)  . sodium chloride    . sodium chloride Stopped (09/06/20 0326)  . ceFEPime (MAXIPIME) IV Stopped (09/06/20 0326)  . vancomycin Stopped (09/05/20 1413)   . acetaminophen  1,000 mg Oral Q6H  . Chlorhexidine Gluconate Cloth  6 each Topical Q0600  . doxercalciferol  3 mcg Intravenous Q T,Th,Sa-HD  . fluticasone furoate-vilanterol  1 puff Inhalation Daily  . gabapentin  300 mg Oral TID  . insulin aspart  0-5 Units Subcutaneous QHS  . insulin aspart  0-6 Units Subcutaneous TID WC  . mouth rinse  15 mL Mouth Rinse BID  . methocarbamol  1,000 mg Oral Q8H  . midodrine  5 mg Oral Q T,Th,Sa-HD  . multivitamin  1 tablet Oral QHS  . pantoprazole (PROTONIX) IV  40 mg Intravenous Q24H    Dialysis Orders: [facility] 3h 87min 400/500 87kg 2/2.25 bath P2 LUA AVF Hep 5000+ 1000 midrun - mircera 100ug last 1/11 - hect 3 ug  Assessment/Plan: 1. ESRD- On HD TTS.  Heparin on hold d/t recent surgery.  Plan for HD today per regular schedule.  2. 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  Per pt told she has stage 4 metastatic colon cancer.  Wants to pursue therapy.  Per Onc can offer palliative chemotherapy.  To follow up as Outpatient. No other therapy options.  3. Osteomyelitis of B/l 3rd digits - s/p amputation by podiatry 2/14.  Pain well controlled.  3. Hypertension/volume-BP Soft/stable.  On midodrine for BP support withHD. LE edema improved today.  UF 1.5L yesterday.  Continue to titrate down volume as tolerated.  4. Renal osteodystrophy - Corrected calciumcontrolled. Phos has been elevated, takes Tums as her binder (has refused other binders). Continue hectorol with HD. 5. Anemia -Hgb increased  to 7.7 today. Stopping mircera with new dx of malignancy. To confer with Onc to see if able to give ESA.  No IV iron with osteomyelitis. Transfuse PRN for Hgb<7 6. DMtype 2- peradmit, on IV dextrose 7. Asthma without exasperation on meds per admit   Jen Mow, PA-C [place] 09/06/2020,10:34 AM  LOS: 10 days

## 2020-09-06 NOTE — Progress Notes (Signed)
Brief Interval Oncology Note  Hospital Summary: [name] is a 70 year old female who presented with symptoms of bowel obstruction and was found to have an adenocarcinoma of the colon. She is status post right hemicolectomy with pathological review confirming metastatic (stage IV) disease.  Interval History: --pathology returned with adenocarcinoma of the colon, pathological staging pT4a, pN2a, pM1c  --patient to be d/c to SNF today --I discussed with her the pathology findings and the steps moving forward.   Objective:  Vitals:   09/06/20 0544 09/06/20 0807  BP: (!) 95/57   Pulse: 78   Resp: 17   Temp: 98 F (36.7 C)   SpO2: 94% 94%   CBC Latest Ref Rng & Units 09/06/2020 09/05/2020 09/03/2020  WBC 4.0 - 10.5 K/uL 6.4 5.9 5.4  Hemoglobin 12.0 - 15.0 g/dL 7.7(L) 7.2(L) 7.5(L)  Hematocrit 36.0 - 46.0 % 22.5(L) 22.2(L) 23.4(L)  Platelets 150 - 400 K/uL 121(L) 114(L) 102(L)   CMP Latest Ref Rng & Units 09/06/2020 09/04/2020 09/03/2020  Glucose 70 - 99 mg/dL 146(H) 136(H) 117(H)  BUN 8 - 23 mg/dL 28(H) 35(H) 23  Creatinine 0.44 - 1.00 mg/dL 5.16(H) 6.94(H) 4.78(H)  Sodium 135 - 145 mmol/L 133(L) 132(L) 137  Potassium 3.5 - 5.1 mmol/L 4.3 3.7 3.5  Chloride 98 - 111 mmol/L 97(L) 94(L) 99  CO2 22 - 32 mmol/L 22 23 25   Calcium 8.9 - 10.3 mg/dL 7.5(L) 7.6(L) 7.6(L)  Total Protein 6.5 - 8.1 g/dL - - -  Total Bilirubin 0.3 - 1.2 mg/dL - - -  Alkaline Phos 38 - 126 U/L - - -  AST 15 - 41 U/L - - -  ALT 0 - 44 U/L - - -   A/P:  #Colon adenocarcinoma, Stage IV --findings are consistent with metastatic disease. Palliative chemotherapy is her only treatment option moving forward. No role for further surgery or radiation --we will schedule outpatient follow up in our clinic in 1-2 weeks to discuss treatment options. --Given her multiple comorbid issues, treatment options may be limited. --palliative care would be a reasonable option moving forward.   Ledell Peoples, MD Department of  Hematology/Oncology [place] at [facility] Phone: [phone] Pager: [phone] Email: [name]

## 2020-09-07 ENCOUNTER — Telehealth: Payer: Self-pay | Admitting: Podiatry

## 2020-09-07 DIAGNOSIS — N189 Chronic kidney disease, unspecified: Secondary | ICD-10-CM | POA: Diagnosis not present

## 2020-09-07 DIAGNOSIS — H359 Unspecified retinal disorder: Secondary | ICD-10-CM | POA: Diagnosis not present

## 2020-09-07 DIAGNOSIS — C182 Malignant neoplasm of ascending colon: Secondary | ICD-10-CM | POA: Diagnosis not present

## 2020-09-07 DIAGNOSIS — I10 Essential (primary) hypertension: Secondary | ICD-10-CM | POA: Diagnosis not present

## 2020-09-07 DIAGNOSIS — Z7189 Other specified counseling: Secondary | ICD-10-CM | POA: Diagnosis not present

## 2020-09-07 DIAGNOSIS — K56691 Other complete intestinal obstruction: Secondary | ICD-10-CM | POA: Diagnosis not present

## 2020-09-07 DIAGNOSIS — I120 Hypertensive chronic kidney disease with stage 5 chronic kidney disease or end stage renal disease: Secondary | ICD-10-CM | POA: Diagnosis not present

## 2020-09-07 DIAGNOSIS — Z5111 Encounter for antineoplastic chemotherapy: Secondary | ICD-10-CM | POA: Diagnosis not present

## 2020-09-07 DIAGNOSIS — C649 Malignant neoplasm of unspecified kidney, except renal pelvis: Secondary | ICD-10-CM | POA: Diagnosis not present

## 2020-09-07 DIAGNOSIS — M6281 Muscle weakness (generalized): Secondary | ICD-10-CM | POA: Diagnosis not present

## 2020-09-07 DIAGNOSIS — E785 Hyperlipidemia, unspecified: Secondary | ICD-10-CM | POA: Diagnosis not present

## 2020-09-07 DIAGNOSIS — K219 Gastro-esophageal reflux disease without esophagitis: Secondary | ICD-10-CM | POA: Diagnosis not present

## 2020-09-07 DIAGNOSIS — I129 Hypertensive chronic kidney disease with stage 1 through stage 4 chronic kidney disease, or unspecified chronic kidney disease: Secondary | ICD-10-CM | POA: Diagnosis not present

## 2020-09-07 DIAGNOSIS — N2581 Secondary hyperparathyroidism of renal origin: Secondary | ICD-10-CM | POA: Diagnosis not present

## 2020-09-07 DIAGNOSIS — L97509 Non-pressure chronic ulcer of other part of unspecified foot with unspecified severity: Secondary | ICD-10-CM | POA: Diagnosis not present

## 2020-09-07 DIAGNOSIS — Z992 Dependence on renal dialysis: Secondary | ICD-10-CM | POA: Diagnosis not present

## 2020-09-07 DIAGNOSIS — E1129 Type 2 diabetes mellitus with other diabetic kidney complication: Secondary | ICD-10-CM | POA: Diagnosis not present

## 2020-09-07 DIAGNOSIS — D126 Benign neoplasm of colon, unspecified: Secondary | ICD-10-CM | POA: Diagnosis not present

## 2020-09-07 DIAGNOSIS — J45909 Unspecified asthma, uncomplicated: Secondary | ICD-10-CM | POA: Diagnosis not present

## 2020-09-07 DIAGNOSIS — M1990 Unspecified osteoarthritis, unspecified site: Secondary | ICD-10-CM | POA: Diagnosis not present

## 2020-09-07 DIAGNOSIS — I739 Peripheral vascular disease, unspecified: Secondary | ICD-10-CM | POA: Diagnosis not present

## 2020-09-07 DIAGNOSIS — F32A Depression, unspecified: Secondary | ICD-10-CM | POA: Diagnosis not present

## 2020-09-07 DIAGNOSIS — E11319 Type 2 diabetes mellitus with unspecified diabetic retinopathy without macular edema: Secondary | ICD-10-CM | POA: Diagnosis not present

## 2020-09-07 DIAGNOSIS — D689 Coagulation defect, unspecified: Secondary | ICD-10-CM | POA: Diagnosis not present

## 2020-09-07 DIAGNOSIS — K56609 Unspecified intestinal obstruction, unspecified as to partial versus complete obstruction: Secondary | ICD-10-CM | POA: Diagnosis not present

## 2020-09-07 DIAGNOSIS — D649 Anemia, unspecified: Secondary | ICD-10-CM | POA: Diagnosis not present

## 2020-09-07 DIAGNOSIS — R6889 Other general symptoms and signs: Secondary | ICD-10-CM | POA: Diagnosis not present

## 2020-09-07 DIAGNOSIS — N250 Renal osteodystrophy: Secondary | ICD-10-CM | POA: Diagnosis not present

## 2020-09-07 DIAGNOSIS — D638 Anemia in other chronic diseases classified elsewhere: Secondary | ICD-10-CM | POA: Diagnosis not present

## 2020-09-07 DIAGNOSIS — C189 Malignant neoplasm of colon, unspecified: Secondary | ICD-10-CM | POA: Diagnosis not present

## 2020-09-07 DIAGNOSIS — C786 Secondary malignant neoplasm of retroperitoneum and peritoneum: Secondary | ICD-10-CM | POA: Diagnosis not present

## 2020-09-07 DIAGNOSIS — M869 Osteomyelitis, unspecified: Secondary | ICD-10-CM | POA: Diagnosis not present

## 2020-09-07 DIAGNOSIS — D631 Anemia in chronic kidney disease: Secondary | ICD-10-CM | POA: Diagnosis not present

## 2020-09-07 DIAGNOSIS — Z7401 Bed confinement status: Secondary | ICD-10-CM | POA: Diagnosis not present

## 2020-09-07 DIAGNOSIS — L03119 Cellulitis of unspecified part of limb: Secondary | ICD-10-CM | POA: Diagnosis not present

## 2020-09-07 DIAGNOSIS — Z743 Need for continuous supervision: Secondary | ICD-10-CM | POA: Diagnosis not present

## 2020-09-07 DIAGNOSIS — M2550 Pain in unspecified joint: Secondary | ICD-10-CM | POA: Diagnosis not present

## 2020-09-07 DIAGNOSIS — E1122 Type 2 diabetes mellitus with diabetic chronic kidney disease: Secondary | ICD-10-CM | POA: Diagnosis not present

## 2020-09-07 DIAGNOSIS — G629 Polyneuropathy, unspecified: Secondary | ICD-10-CM | POA: Diagnosis not present

## 2020-09-07 DIAGNOSIS — Z933 Colostomy status: Secondary | ICD-10-CM | POA: Diagnosis not present

## 2020-09-07 DIAGNOSIS — N186 End stage renal disease: Secondary | ICD-10-CM | POA: Diagnosis not present

## 2020-09-07 DIAGNOSIS — Z741 Need for assistance with personal care: Secondary | ICD-10-CM | POA: Diagnosis not present

## 2020-09-07 LAB — RENAL FUNCTION PANEL
Albumin: 1.9 g/dL — ABNORMAL LOW (ref 3.5–5.0)
Anion gap: 15 (ref 5–15)
BUN: 41 mg/dL — ABNORMAL HIGH (ref 8–23)
CO2: 21 mmol/L — ABNORMAL LOW (ref 22–32)
Calcium: 8 mg/dL — ABNORMAL LOW (ref 8.9–10.3)
Chloride: 96 mmol/L — ABNORMAL LOW (ref 98–111)
Creatinine, Ser: 7.14 mg/dL — ABNORMAL HIGH (ref 0.44–1.00)
GFR, Estimated: 6 mL/min — ABNORMAL LOW (ref >60)
Glucose, Bld: 115 mg/dL — ABNORMAL HIGH (ref 70–99)
Phosphorus: 5.6 mg/dL — ABNORMAL HIGH (ref 2.5–4.6)
Potassium: 4.4 mmol/L (ref 3.5–5.1)
Sodium: 132 mmol/L — ABNORMAL LOW (ref 135–145)

## 2020-09-07 LAB — CBC
HCT: 23.3 % — ABNORMAL LOW (ref 36.0–46.0)
Hemoglobin: 7.6 g/dL — ABNORMAL LOW (ref 12.0–15.0)
MCH: 31.8 pg (ref 26.0–34.0)
MCHC: 32.6 g/dL (ref 30.0–36.0)
MCV: 97.5 fL (ref 80.0–100.0)
Platelets: 148 10*3/uL — ABNORMAL LOW (ref 150–400)
RBC: 2.39 MIL/uL — ABNORMAL LOW (ref 3.87–5.11)
RDW: 15.7 % — ABNORMAL HIGH (ref 11.5–15.5)
WBC: 6.8 10*3/uL (ref 4.0–10.5)
nRBC: 0 % (ref 0.0–0.2)

## 2020-09-07 LAB — GLUCOSE, CAPILLARY: Glucose-Capillary: 105 mg/dL — ABNORMAL HIGH (ref 70–99)

## 2020-09-07 MED ORDER — VANCOMYCIN HCL IN DEXTROSE 1-5 GM/200ML-% IV SOLN
INTRAVENOUS | Status: AC
  Filled 2020-09-07: qty 200

## 2020-09-07 MED ORDER — DOXYCYCLINE HYCLATE 100 MG PO TABS
100.0000 mg | ORAL_TABLET | Freq: Two times a day (BID) | ORAL | Status: DC
  Filled 2020-09-07: qty 1

## 2020-09-07 MED ORDER — DOXERCALCIFEROL 4 MCG/2ML IV SOLN
INTRAVENOUS | Status: AC
  Administered 2020-09-07: 3 ug via INTRAVENOUS
  Filled 2020-09-07: qty 2

## 2020-09-07 MED ORDER — DARBEPOETIN ALFA 150 MCG/0.3ML IJ SOSY
PREFILLED_SYRINGE | INTRAMUSCULAR | Status: AC
  Administered 2020-09-07: 150 ug via INTRAVENOUS
  Filled 2020-09-07: qty 0.3

## 2020-09-07 MED ORDER — MIDODRINE HCL 5 MG PO TABS
ORAL_TABLET | ORAL | Status: AC
  Filled 2020-09-07: qty 1

## 2020-09-07 NOTE — TOC Transition Note (Signed)
Transition of Care [facility]) - CM/SW Discharge Note   Patient Details  Name: [name] MRN: [id number] Date of Birth: 05-11-1951  Transition of Care [facility]) CM/SW Contact:  Sable Feil, LCSW Phone Number: 09/07/2020, 11:26 AM   Clinical Narrative:  Patient medically stable for discharge and going to [facility]. Facility received discharge summary on 2/16 and MD completed progress note regarding no changes in patient's medical status this morning. [name] was transported to facility by non-emergency ambulance transport. Her sister, Solstice Lastinger ([phone]) was contacted and informed of patient's discharge.   Final next level of care: [place] [facility]) Barriers to Discharge: Barriers Resolved   Patient Goals and CMS Choice Patient states their goals for this hospitalization and ongoing recovery are:: Patient agreeable to ST rehab before returning home CMS Medicare.gov Compare Post Acute Care list provided to:: Patient Choice offered to / list presented to : Patient  Discharge Placement   Existing PASRR number confirmed : 08/31/20          Patient chooses bed at: [facility] Patient to be transferred to facility by: Non-emergency ambulance transport Name of family member notified: Candie Mile - sister - [name] Patient and family notified of of transfer: 09/07/20  Discharge Plan and Services In-house Referral: Clinical Social Work Discharge Planning Services: CM Consult Post Acute Care Choice: [place]          DME Arranged: N/A DME Agency: NA       HH Arranged: NA HH Agency: NA        Social Determinants of Health ([place]) Interventions  No SDOH interventions requested or needed at discharge   Readmission Risk Interventions No flowsheet data found.

## 2020-09-07 NOTE — Telephone Encounter (Signed)
The rehab nurse needs clarification of discharge orders from [facility]. Also information about [place].

## 2020-09-07 NOTE — Progress Notes (Signed)
DISCHARGE NOTE SNF [name] to be discharged to [facility] per MD order. Patient verbalized understanding.  Skin clean, dry and intact without evidence of skin break down, no evidence of skin tears noted. IV catheter discontinued intact. Site without signs and symptoms of complications. Dressing and pressure applied. Pt denies pain at the site currently. No complaints noted.  Patient free of lines, drains, and wounds.   Discharge packet assembled. An After Visit Summary (AVS) was printed and given to the EMS personnel. Patient escorted via stretcher and discharged to [facility] via ambulance. Report called to accepting facility; all questions and concerns addressed.   Mikki [place], RN

## 2020-09-07 NOTE — Procedures (Signed)
Patient seen on Hemodialysis. BP (!) 90/50   Pulse 71   Temp (!) 97.5 F (36.4 C) (Oral)   Resp 20   Ht 5\' 7"  (1.702 m)   Wt 90.3 kg Comment: stood to scale  SpO2 97%   BMI 31.18 kg/m   QB 400, UF goal 1L Tolerating treatment without complaints at this time. To DC to [facility].   Elmarie Shiley MD [facility]. Office # [phone] Pager # [phone] 8:53 AM

## 2020-09-07 NOTE — Progress Notes (Signed)
Patient seen and examined, discharge was held yesterday due to transportation issues.  Patient is stable for discharge today. Vitals:   09/07/20 0930 09/07/20 1003  BP: (!) 89/52 (!) 91/47  Pulse: 73 74  Resp:  18  Temp:  98.3 F (36.8 C)  SpO2:  99%   Patient is ready for discharge.

## 2020-09-07 NOTE — Discharge Instructions (Signed)
Please remove abdominal staples on 2/21.  Place steri-strips over wound  [facility], [place] [phone]  OPEN ABDOMINAL SURGERY: POST OP INSTRUCTIONS  Always review your discharge instruction sheet given to you by the facility where your surgery was performed.  IF YOU HAVE DISABILITY OR FAMILY LEAVE FORMS, YOU MUST BRING THEM TO THE OFFICE FOR PROCESSING.  PLEASE DO NOT GIVE THEM TO YOUR DOCTOR.  1. A prescription for pain medication may be given to you upon discharge.  Take your pain medication as prescribed, if needed.  If narcotic pain medicine is not needed, then you may take acetaminophen (Tylenol) or ibuprofen (Advil) as needed. 2. Take your usually prescribed medications unless otherwise directed. 3. If you need a refill on your pain medication, please contact your pharmacy. They will contact our office to request authorization.  Prescriptions will not be filled after 5pm or on week-ends. 4. You should follow a light diet the first few days after arrival home, such as soup and crackers, pudding, etc.unless your doctor has advised otherwise. A high-fiber, low fat diet can be resumed as tolerated.   Be sure to include lots of fluids daily. Most patients will experience some swelling and bruising on the chest and neck area.  Ice packs will help.  Swelling and bruising can take several days to resolve 5. Most patients will experience some swelling and bruising in the area of the incision. Ice pack will help. Swelling and bruising can take several days to resolve..  6. It is common to experience some constipation if taking pain medication after surgery.  Increasing fluid intake and taking a stool softener will usually help or prevent this problem from occurring.  A mild laxative (Milk of Magnesia or Miralax) should be taken according to package directions if there are no bowel movements after 48 hours. 7.  You may have steri-strips (small skin tapes) in place directly over the  incision.  These strips should be left on the skin for 7-10 days.  If your surgeon used skin glue on the incision, you may shower in 24 hours.  The glue will flake off over the next 2-3 weeks.  Any sutures or staples will be removed at the office during your follow-up visit. You may find that a light gauze bandage over your incision may keep your staples from being rubbed or pulled. You may shower and replace the bandage daily. 8. ACTIVITIES:  You may resume regular (light) daily activities beginning the next day--such as daily self-care, walking, climbing stairs--gradually increasing activities as tolerated.  You may have sexual intercourse when it is comfortable.  Refrain from any heavy lifting or straining until approved by your doctor. a. You may drive when you no longer are taking prescription pain medication, you can comfortably wear a seatbelt, and you can safely maneuver your car and apply brakes b. Return to Work: ___________________________________ 53. You should see your doctor in the office for a follow-up appointment approximately two weeks after your surgery.  Make sure that you call for this appointment within a day or two after you arrive home to insure a convenient appointment time. OTHER INSTRUCTIONS:  _____________________________________________________________ _____________________________________________________________  WHEN TO CALL YOUR DOCTOR: 1. Fever over 101.0 2. Inability to urinate 3. Nausea and/or vomiting 4. Extreme swelling or bruising 5. Continued bleeding from incision. 6. Increased pain, redness, or drainage from the incision. 7. Difficulty swallowing or breathing 8. Muscle cramping or spasms. 9. Numbness or tingling in hands  or feet or around lips.  The clinic staff is available to answer your questions during regular business hours.  Please don't hesitate to call and ask to speak to one of the nurses if you have concerns.  For further questions, please visit  [email or web address]   Ileostomy, Care After This sheet gives you information about how to care for yourself after your procedure. Your health care provider may also give you more specific instructions. If you have problems or questions, contact your health care provider. What can I expect after the procedure? After the procedure, it is common to have:  A small amount of blood or clear fluid leaking from your stoma.  Pain and discomfort in your abdomen, especially around your stoma.  Irregular bowel movements for several days.  Loose stool. Follow these instructions at home: Medicines  Take over-the-counter and prescription medicines only as told by your health care provider.  If you were prescribed an antibiotic medicine, take it as told by your health care provider. Do not stop taking the antibiotic even if you start to feel better. Stoma and incision care  Keep the skin that surrounds your stoma clean and dry.  Follow instructions from your health care provider about how to take care of your incision. Make sure you: ? Wash your hands with soap and water before and after you change your bandage (dressing). If soap and water are not available, use hand sanitizer. ? Change your dressing as told by your health care provider. ? Leave stitches (sutures), skin glue, or adhesive strips in place. These skin closures may need to stay in place for 2 weeks or longer. If adhesive strip edges start to loosen and curl up, you may trim the loose edges. Do not remove adhesive strips completely unless your health care provider tells you to do that.  Check your stoma area every day for signs of infection. Check for: ? More redness, swelling, or pain. ? More fluid or blood. ? Warmth. ? Pus or a bad smell.  Follow your health care provider's instructions about changing and cleaning your ostomy pouch.  Keep supplies with you at all times to care for your stoma and ostomy pouch. Also keep  extra clothing with you.   Eating and drinking  Follow instructions from your health care provider about eating or drinking restrictions.  Pay attention to which foods and drinks cause problems with digestion, such as gas, constipation, or diarrhea.  Avoid spicy foods and caffeine while your stoma heals.  Eat meals and snacks at regular intervals.  Drink enough fluid to keep your urine pale yellow.   Activity  Return to your normal activities as told by your health care provider. Ask your health care provider what activities are safe for you.  Rest as much as possible while your stoma heals.  Avoid intense physical activity for as long as you are told by your health care provider.  Do not lift anything that is heavier than 10 lb (4.5 kg), or the limit that you are told, for 6 weeks or until your health care provider says that it is safe.   General instructions  Do not drive or use heavy machinery while taking prescription pain medicine.  Wear compression stockings as told by your health care provider. These stockings help to prevent blood clots and reduce swelling in your legs.  Do not take baths, swim, or use a hot tub until your health care provider approves. Ask your health care provider if you  may take showers.  Do not use any products that contain nicotine or tobacco, such as cigarettes, e-cigarettes, and chewing tobacco. These can delay incision healing after surgery. If you need help quitting, ask your health care provider.  (Women) Talk with your health care provider if you plan to become pregnant or if you take birth control pills.  Keep all follow-up visits as told by your health care provider. This is important. Contact a health care provider if:  You have more redness, swelling, or pain at or around your stoma.  You have more fluid or blood coming from your stoma.  Your stoma feels warm to the touch.  You have pus or a bad smell coming from your stoma.  You  have a fever.  You have loose stools that do not become thicker after several weeks.  You have bowel movements more often or less often than your health care provider tells you to expect.  You feel nauseous.  You vomit.  You have abdominal pain, bloating, pressure, or cramping.  You have problems with sexual activity.  You have an unusual lack of energy (fatigue).  You are unusually thirsty or you always have a dry mouth. Get help right away if:  You feel dizzy or light-headed.  You have pain or cramps in your abdomen that get worse or do not go away with medicine.  Your stoma suddenly changes size or color.  You have shortness of breath.  You have bleeding from your stoma that does not stop.  You vomit more than one time.  You faint.  You have internal tissue coming out of your stoma (prolapse).  You have an irregular heartbeat.  You have chest pain. Summary  Take over-the-counter and prescription medicines only as told by your health care provider.  Follow your health care provider's instructions about how to take care of your incision and stoma.  Follow instructions from your health care provider about eating or drinking restrictions.  Do not take baths, swim, or use a hot tub until your health care provider approves. Ask your health care provider if you may take showers.  Contact a health care provider if you have more redness, swelling, or pain at or around your stoma. This information is not intended to replace advice given to you by your health care provider. Make sure you discuss any questions you have with your health care provider. Document Revised: 03/16/2018 Document Reviewed: 03/16/2018 Elsevier Patient Education  2021 [employer].

## 2020-09-08 DIAGNOSIS — H359 Unspecified retinal disorder: Secondary | ICD-10-CM | POA: Diagnosis not present

## 2020-09-08 DIAGNOSIS — I10 Essential (primary) hypertension: Secondary | ICD-10-CM | POA: Diagnosis not present

## 2020-09-08 DIAGNOSIS — F32A Depression, unspecified: Secondary | ICD-10-CM | POA: Diagnosis not present

## 2020-09-08 DIAGNOSIS — J45909 Unspecified asthma, uncomplicated: Secondary | ICD-10-CM | POA: Diagnosis not present

## 2020-09-08 DIAGNOSIS — G629 Polyneuropathy, unspecified: Secondary | ICD-10-CM | POA: Diagnosis not present

## 2020-09-08 DIAGNOSIS — C649 Malignant neoplasm of unspecified kidney, except renal pelvis: Secondary | ICD-10-CM | POA: Diagnosis not present

## 2020-09-08 DIAGNOSIS — N186 End stage renal disease: Secondary | ICD-10-CM | POA: Diagnosis not present

## 2020-09-08 DIAGNOSIS — E1129 Type 2 diabetes mellitus with other diabetic kidney complication: Secondary | ICD-10-CM | POA: Diagnosis not present

## 2020-09-08 DIAGNOSIS — K219 Gastro-esophageal reflux disease without esophagitis: Secondary | ICD-10-CM | POA: Diagnosis not present

## 2020-09-08 DIAGNOSIS — E785 Hyperlipidemia, unspecified: Secondary | ICD-10-CM | POA: Diagnosis not present

## 2020-09-08 DIAGNOSIS — I739 Peripheral vascular disease, unspecified: Secondary | ICD-10-CM | POA: Diagnosis not present

## 2020-09-08 DIAGNOSIS — D649 Anemia, unspecified: Secondary | ICD-10-CM | POA: Diagnosis not present

## 2020-09-09 DIAGNOSIS — E1129 Type 2 diabetes mellitus with other diabetic kidney complication: Secondary | ICD-10-CM | POA: Diagnosis not present

## 2020-09-09 DIAGNOSIS — Z992 Dependence on renal dialysis: Secondary | ICD-10-CM | POA: Diagnosis not present

## 2020-09-09 DIAGNOSIS — D631 Anemia in chronic kidney disease: Secondary | ICD-10-CM | POA: Diagnosis not present

## 2020-09-09 DIAGNOSIS — D689 Coagulation defect, unspecified: Secondary | ICD-10-CM | POA: Diagnosis not present

## 2020-09-09 DIAGNOSIS — N2581 Secondary hyperparathyroidism of renal origin: Secondary | ICD-10-CM | POA: Diagnosis not present

## 2020-09-09 DIAGNOSIS — N186 End stage renal disease: Secondary | ICD-10-CM | POA: Diagnosis not present

## 2020-09-11 ENCOUNTER — Encounter: Payer: Medicare Other | Admitting: Podiatry

## 2020-09-11 DIAGNOSIS — M869 Osteomyelitis, unspecified: Secondary | ICD-10-CM | POA: Diagnosis not present

## 2020-09-11 DIAGNOSIS — E1129 Type 2 diabetes mellitus with other diabetic kidney complication: Secondary | ICD-10-CM | POA: Diagnosis not present

## 2020-09-11 DIAGNOSIS — I739 Peripheral vascular disease, unspecified: Secondary | ICD-10-CM | POA: Diagnosis not present

## 2020-09-11 DIAGNOSIS — G629 Polyneuropathy, unspecified: Secondary | ICD-10-CM | POA: Diagnosis not present

## 2020-09-11 DIAGNOSIS — N186 End stage renal disease: Secondary | ICD-10-CM | POA: Diagnosis not present

## 2020-09-11 DIAGNOSIS — I10 Essential (primary) hypertension: Secondary | ICD-10-CM | POA: Diagnosis not present

## 2020-09-11 DIAGNOSIS — F32A Depression, unspecified: Secondary | ICD-10-CM | POA: Diagnosis not present

## 2020-09-11 NOTE — Op Note (Signed)
OPERATIVE REPORT Patient name: [name] MRN: [id number] DOB: December 24, 1950  DOS: 09/04/2020  Preop Dx: Osteomyelitis right third toe.  Ulcer left third toe. Postop Dx: same  Procedure:  1.  Right third toe amputation.   2.  Left third toe arthroplasty.  Surgeon: Edrick Kins DPM  Anesthesia: 50-50 mixture of 2% lidocaine plain with 0.5% Marcaine plain totaling 10 cc infiltrated in the patient's bilateral lower extremity  Hemostasis: Ankle tourniquet inflated to a pressure of 252mmHg after esmarch exsanguination   EBL: Minimal mL Materials: None Injectables: None Pathology: None  Condition: The patient tolerated the procedure and anesthesia well. No complications noted or reported   Justification for procedure: The patient is a 70 y.o. female who presents today for surgical correction of chronic ulcers to the bilateral third toes.  The right third toe has exposed bone.  Left third toe has a chronic ulcer overlying the PIPJ of the left third toe with no exposed bone.. All conservative modalities of been unsuccessful in providing any sort of satisfactory alleviation of symptoms with the patient. The patient was told benefits as well as possible side effects of the surgery. The patient consented for surgical correction. The patient consent form was reviewed. All patient questions were answered. No guarantees were expressed or implied. The patient and the surgeon boson the patient consent form with the witness present and placed in the patient's chart.   Procedure in Detail: The patient was brought to the operating room, placed in the operating table in the supine position at which time an aseptic scrub and drape were performed about the patient's respective lower extremity after anesthesia was induced as described above. Attention was then directed to the surgical area where procedure number one commenced.  Procedure #1: Right third toe amputation A racquet type incision was  planned and made about the third MTPJ right foot.  Incision was carried down to the level of joint capsule with care taken to cut clamp ligate and retract away all small neurovascular structures traversing the incision site.  The right third toe was grasped with a perforating towel clamp and distracted distally and disarticulated at the level of the MTPJ using a surgical #15 scalpel.  The exposed tendons of the EDL and FDL were distracted as far distal as possible and cut as far proximal as could be visualized.  The surrounding tissue of the amputation site appeared healthy and viable.  Irrigation was performed and primary closure was obtained using 4-0 Prolene suture.  Procedure #2: Left third toe arthroplasty An elliptical incision was planned and made overlying the PIPJ and circumscribing the ulcer of the PIPJ left third toe.  Incision was carried down to the level of joint and a transverse tenotomy was also performed of the exposed EDL tendon.  After appropriate exposure and visualization of the PIPJ, the capsular tissue was reflected away in order to expose the head of the PIPJ.  A sagittal blade mounted sagittal saw was utilized to create an osteotomy at the surgical phalangeal neck of the proximal phalanx.  The base of the middle phalanx was also resected away using a sagittal blade mounted on a sagittal saw.  Irrigation was utilized and primary closure was obtained using 4-0 Prolene suture.  Intraoperative x-ray fluoroscopy was utilized prior to closure of the bilateral feet to visualize the disarticulation of the right third toe and arthroplasty of the left third toe which was satisfactory.  Dry sterile compressive dressings were then applied to all  previously mentioned incision sites about the patient's lower extremity. The tourniquet which was used for hemostasis was deflated. All normal neurovascular responses including pink color and warmth returned all the digits of patient's lower  extremity.  The patient was then transferred from the operating room to the recovery room having tolerated the procedure and anesthesia well. All vital signs are stable. After a brief stay in the recovery room the patient was discharged with adequate prescriptions for analgesia. Verbal as well as written instructions were provided for the patient regarding wound care. The patient is to keep the dressings clean dry and intact until they are to follow surgeon Dr. Daylene Katayama in the office upon discharge.   Edrick Kins, DPM [facility]  Dr. Edrick Kins, [place]                                        [place], [place] [address]                Office [phone]  Fax [phone]

## 2020-09-12 ENCOUNTER — Ambulatory Visit: Payer: Medicare Other | Admitting: Thoracic Surgery (Cardiothoracic Vascular Surgery)

## 2020-09-12 DIAGNOSIS — D689 Coagulation defect, unspecified: Secondary | ICD-10-CM | POA: Diagnosis not present

## 2020-09-12 DIAGNOSIS — N186 End stage renal disease: Secondary | ICD-10-CM | POA: Diagnosis not present

## 2020-09-12 DIAGNOSIS — E1129 Type 2 diabetes mellitus with other diabetic kidney complication: Secondary | ICD-10-CM | POA: Diagnosis not present

## 2020-09-12 DIAGNOSIS — Z992 Dependence on renal dialysis: Secondary | ICD-10-CM | POA: Diagnosis not present

## 2020-09-12 DIAGNOSIS — D631 Anemia in chronic kidney disease: Secondary | ICD-10-CM | POA: Diagnosis not present

## 2020-09-12 DIAGNOSIS — N2581 Secondary hyperparathyroidism of renal origin: Secondary | ICD-10-CM | POA: Diagnosis not present

## 2020-09-13 ENCOUNTER — Encounter: Payer: Medicare Other | Admitting: Podiatry

## 2020-09-13 ENCOUNTER — Other Ambulatory Visit: Payer: Self-pay

## 2020-09-13 ENCOUNTER — Ambulatory Visit (INDEPENDENT_AMBULATORY_CARE_PROVIDER_SITE_OTHER): Payer: Medicare Other | Admitting: Podiatry

## 2020-09-13 DIAGNOSIS — M869 Osteomyelitis, unspecified: Secondary | ICD-10-CM

## 2020-09-13 DIAGNOSIS — L97522 Non-pressure chronic ulcer of other part of left foot with fat layer exposed: Secondary | ICD-10-CM

## 2020-09-13 DIAGNOSIS — Z9889 Other specified postprocedural states: Secondary | ICD-10-CM

## 2020-09-13 NOTE — Progress Notes (Signed)
   Subjective:  Patient presents today status post bilateral third toe surgery. DOS: 09/05/2020.  Patient states that in regards to the feet she is feeling very well.  She is very stressed because of her recent diagnosis of metastasis and cancer however she is in good spirits today.  She is kept the dressings clean dry and intact and weightbearing as tolerated.  Past Medical History:  Diagnosis Date  . Anemia   . Anemia associated with chronic renal failure   . Anemia in chronic kidney disease 09/29/2018  . Arthritis   . Asthma   . Blood transfusion without reported diagnosis    Phreesia 02/27/2020  . Cancer ([place])    Phreesia 02/27/2020  . Cataract    OD  . Chronic kidney disease    Phreesia 02/27/2020  . Coronary artery calcification seen on CAT scan 02/17/2018   Coronary calcification on CT  . Depression   . Diabetes mellitus without complication ([place])    Phreesia 02/27/2020  . Diabetic retinopathy of both eyes ([place])   . Diabetic ulcer of right foot associated with type 2 diabetes mellitus ([place])   . ESRD (end stage renal disease) on dialysis ([place])   . Essential hypertension 02/17/2018   Essential hypertension  . GERD (gastroesophageal reflux disease)    pt denies  . HLD (hyperlipidemia)   . Hypertension   . Hypertensive retinopathy    OU  . Left ventricular dysfunction 04/07/2018   Left ventricle dysfunction  . Microalbuminuria due to type 2 diabetes mellitus ([place])   . Neuromuscular disorder ([place])    diabetic neuropathy  . Nonproliferative retinopathy due to secondary diabetes ([place])   . Nonsustained ventricular tachycardia ([place]) 08/28/2018   Nonsustained ventricular tachycardia  . Pneumonia    hx of   . Renal cell cancer ([place])   . Renal insufficiency   . Right renal mass 03/10/2017  . Type 2 diabetes, controlled, with neuropathy ([place]) 06/22/2013  . Ulcer of other part of foot 06/22/2013  . 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  Skin incisions appear to be well coapted with sutures intact. No sign of infectious process noted. No dehiscence. No active bleeding noted. Moderate edema noted to the surgical extremity.  Assessment: 1. s/p right third toe amputation.  Left third toe arthroplasty. DOS: 09/05/2020   Plan of Care:  1. Patient was evaluated.  2.  Dressings changed today.  Keep clean dry and intact x1 week 3.  Continue minimal weightbearing in the postsurgical shoes 4.  Return to clinic in 1 week for possible suture removal   Edrick Kins, DPM [facility]  Dr. Edrick Kins, DPM    [address], [place] [address]                Office [phone]  Fax [phone]

## 2020-09-14 DIAGNOSIS — E1129 Type 2 diabetes mellitus with other diabetic kidney complication: Secondary | ICD-10-CM | POA: Diagnosis not present

## 2020-09-14 DIAGNOSIS — Z992 Dependence on renal dialysis: Secondary | ICD-10-CM | POA: Diagnosis not present

## 2020-09-14 DIAGNOSIS — D689 Coagulation defect, unspecified: Secondary | ICD-10-CM | POA: Diagnosis not present

## 2020-09-14 DIAGNOSIS — N2581 Secondary hyperparathyroidism of renal origin: Secondary | ICD-10-CM | POA: Diagnosis not present

## 2020-09-14 DIAGNOSIS — D631 Anemia in chronic kidney disease: Secondary | ICD-10-CM | POA: Diagnosis not present

## 2020-09-14 DIAGNOSIS — N186 End stage renal disease: Secondary | ICD-10-CM | POA: Diagnosis not present

## 2020-09-15 ENCOUNTER — Encounter (INDEPENDENT_AMBULATORY_CARE_PROVIDER_SITE_OTHER): Payer: Medicare Other | Admitting: Ophthalmology

## 2020-09-15 ENCOUNTER — Telehealth: Payer: Self-pay | Admitting: *Deleted

## 2020-09-15 ENCOUNTER — Telehealth: Payer: Self-pay | Admitting: Hematology and Oncology

## 2020-09-15 DIAGNOSIS — E1122 Type 2 diabetes mellitus with diabetic chronic kidney disease: Secondary | ICD-10-CM | POA: Diagnosis not present

## 2020-09-15 DIAGNOSIS — I739 Peripheral vascular disease, unspecified: Secondary | ICD-10-CM | POA: Diagnosis not present

## 2020-09-15 DIAGNOSIS — E1129 Type 2 diabetes mellitus with other diabetic kidney complication: Secondary | ICD-10-CM | POA: Diagnosis not present

## 2020-09-15 DIAGNOSIS — G629 Polyneuropathy, unspecified: Secondary | ICD-10-CM | POA: Diagnosis not present

## 2020-09-15 DIAGNOSIS — F32A Depression, unspecified: Secondary | ICD-10-CM | POA: Diagnosis not present

## 2020-09-15 DIAGNOSIS — N186 End stage renal disease: Secondary | ICD-10-CM | POA: Diagnosis not present

## 2020-09-15 DIAGNOSIS — L97509 Non-pressure chronic ulcer of other part of unspecified foot with unspecified severity: Secondary | ICD-10-CM | POA: Diagnosis not present

## 2020-09-15 DIAGNOSIS — I10 Essential (primary) hypertension: Secondary | ICD-10-CM | POA: Diagnosis not present

## 2020-09-15 DIAGNOSIS — Z933 Colostomy status: Secondary | ICD-10-CM | POA: Diagnosis not present

## 2020-09-15 DIAGNOSIS — K56609 Unspecified intestinal obstruction, unspecified as to partial versus complete obstruction: Secondary | ICD-10-CM | POA: Diagnosis not present

## 2020-09-15 DIAGNOSIS — D638 Anemia in other chronic diseases classified elsewhere: Secondary | ICD-10-CM | POA: Diagnosis not present

## 2020-09-15 DIAGNOSIS — C189 Malignant neoplasm of colon, unspecified: Secondary | ICD-10-CM | POA: Diagnosis not present

## 2020-09-15 NOTE — Telephone Encounter (Signed)
Patients son called stating he was expecting a new patient appointment to be scheduled for his mother.  Unable to see new patient referral or any communication to get this scheduled sent to New Patient referral coordinator.  Routed to Dr Lorenso Courier to advise, his nurse and new patient coordinator.  Seth Bake,  Can you please reach out to son Malerie Eakins) [phone].  Patient is in a rehab facility and will need to coordinate that visit with them.

## 2020-09-15 NOTE — Telephone Encounter (Signed)
Received a msg from [employer], the desk nurse, to schedule an appt w/Dr. Lorenso Courier for a hospital f/u appt. I cld and spoke to the pt's son and schedule Ms. Ullman to see Dr. Lorenso Courier on 3/4 at 2pm. Aware to arrive 20 minutes early.

## 2020-09-16 DIAGNOSIS — N186 End stage renal disease: Secondary | ICD-10-CM | POA: Diagnosis not present

## 2020-09-16 DIAGNOSIS — Z992 Dependence on renal dialysis: Secondary | ICD-10-CM | POA: Diagnosis not present

## 2020-09-16 DIAGNOSIS — E1129 Type 2 diabetes mellitus with other diabetic kidney complication: Secondary | ICD-10-CM | POA: Diagnosis not present

## 2020-09-16 DIAGNOSIS — D631 Anemia in chronic kidney disease: Secondary | ICD-10-CM | POA: Diagnosis not present

## 2020-09-16 DIAGNOSIS — N2581 Secondary hyperparathyroidism of renal origin: Secondary | ICD-10-CM | POA: Diagnosis not present

## 2020-09-16 DIAGNOSIS — D689 Coagulation defect, unspecified: Secondary | ICD-10-CM | POA: Diagnosis not present

## 2020-09-18 DIAGNOSIS — Z992 Dependence on renal dialysis: Secondary | ICD-10-CM | POA: Diagnosis not present

## 2020-09-18 DIAGNOSIS — I129 Hypertensive chronic kidney disease with stage 1 through stage 4 chronic kidney disease, or unspecified chronic kidney disease: Secondary | ICD-10-CM | POA: Diagnosis not present

## 2020-09-18 DIAGNOSIS — N186 End stage renal disease: Secondary | ICD-10-CM | POA: Diagnosis not present

## 2020-09-19 DIAGNOSIS — N2581 Secondary hyperparathyroidism of renal origin: Secondary | ICD-10-CM | POA: Diagnosis not present

## 2020-09-19 DIAGNOSIS — I129 Hypertensive chronic kidney disease with stage 1 through stage 4 chronic kidney disease, or unspecified chronic kidney disease: Secondary | ICD-10-CM | POA: Diagnosis not present

## 2020-09-19 DIAGNOSIS — D689 Coagulation defect, unspecified: Secondary | ICD-10-CM | POA: Diagnosis not present

## 2020-09-19 DIAGNOSIS — N186 End stage renal disease: Secondary | ICD-10-CM | POA: Diagnosis not present

## 2020-09-19 DIAGNOSIS — Z992 Dependence on renal dialysis: Secondary | ICD-10-CM | POA: Diagnosis not present

## 2020-09-20 ENCOUNTER — Other Ambulatory Visit: Payer: Self-pay

## 2020-09-20 ENCOUNTER — Ambulatory Visit (INDEPENDENT_AMBULATORY_CARE_PROVIDER_SITE_OTHER): Payer: Medicare Other | Admitting: Podiatry

## 2020-09-20 DIAGNOSIS — Z9889 Other specified postprocedural states: Secondary | ICD-10-CM

## 2020-09-20 DIAGNOSIS — M869 Osteomyelitis, unspecified: Secondary | ICD-10-CM

## 2020-09-20 DIAGNOSIS — L97522 Non-pressure chronic ulcer of other part of left foot with fat layer exposed: Secondary | ICD-10-CM

## 2020-09-20 NOTE — Progress Notes (Signed)
Subjective:  Patient presents today status post bilateral third toe surgery. DOS: 09/05/2020.  Patient states that she has developed a blister/sore to the posterior aspect of the right heel by using the wheelchair and using it to push off with.  Other than that she has no new complaints at this time  Past Medical History:  Diagnosis Date  . Anemia   . Anemia associated with chronic renal failure   . Anemia in chronic kidney disease 09/29/2018  . Arthritis   . Asthma   . Blood transfusion without reported diagnosis    Phreesia 02/27/2020  . Cancer ([place])    Phreesia 02/27/2020  . Cataract    OD  . Chronic kidney disease    Phreesia 02/27/2020  . Coronary artery calcification seen on CAT scan 02/17/2018   Coronary calcification on CT  . Depression   . Diabetes mellitus without complication ([place])    Phreesia 02/27/2020  . Diabetic retinopathy of both eyes ([place])   . Diabetic ulcer of right foot associated with type 2 diabetes mellitus ([place])   . ESRD (end stage renal disease) on dialysis ([place])   . Essential hypertension 02/17/2018   Essential hypertension  . GERD (gastroesophageal reflux disease)    pt denies  . HLD (hyperlipidemia)   . Hypertension   . Hypertensive retinopathy    OU  . Left ventricular dysfunction 04/07/2018   Left ventricle dysfunction  . Microalbuminuria due to type 2 diabetes mellitus ([place])   . Neuromuscular disorder ([place])    diabetic neuropathy  . Nonproliferative retinopathy due to secondary diabetes ([place])   . Nonsustained ventricular tachycardia ([place]) 08/28/2018   Nonsustained ventricular tachycardia  . Pneumonia    hx of   . Renal cell cancer ([place])   . Renal insufficiency   . Right renal mass 03/10/2017  . Type 2 diabetes, controlled, with neuropathy ([place]) 06/22/2013  . Ulcer of other part of foot 06/22/2013  . 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  Skin incisions  appear to be well coapted with sutures intact with exception of a few sutures that have loosened to remove a left total arthroplasty.  There is no dehiscence noted though.  No sign of infectious process noted. No dehiscence. No active bleeding noted. Moderate edema noted to the surgical extremity.  Patient has developed a new ulcer to the posterior aspect of the right heel.  Is a very superficial overlying eschar to the posterior tubercle of the heel.  Measures approximately 1 cm in diameter.  No drainage.  No malodor noted.  Assessment: 1. s/p right third toe amputation.  Left third toe arthroplasty. DOS: 09/05/2020 2.  Ulcer right posterior heel   Plan of Care:  1. Patient was evaluated.  2.  Dressings changed today.  Keep clean dry and intact x1 week 3.  Continue minimal weightbearing in the postsurgical shoes 4.  Recommend Betadine daily with a light dressing to both surgical sites and the right posterior heel  5.  Return to clinic in 1 week for possible suture removal   Edrick Kins, DPM [facility]  Dr. Edrick Kins, DPM    [address], [place] [address]  Office [phone]  Fax [phone]

## 2020-09-21 DIAGNOSIS — Z992 Dependence on renal dialysis: Secondary | ICD-10-CM | POA: Diagnosis not present

## 2020-09-21 DIAGNOSIS — D689 Coagulation defect, unspecified: Secondary | ICD-10-CM | POA: Diagnosis not present

## 2020-09-21 DIAGNOSIS — N186 End stage renal disease: Secondary | ICD-10-CM | POA: Diagnosis not present

## 2020-09-21 DIAGNOSIS — N2581 Secondary hyperparathyroidism of renal origin: Secondary | ICD-10-CM | POA: Diagnosis not present

## 2020-09-22 ENCOUNTER — Inpatient Hospital Stay: Payer: Medicare Other | Attending: Hematology and Oncology | Admitting: Hematology and Oncology

## 2020-09-22 ENCOUNTER — Inpatient Hospital Stay: Payer: Medicare Other

## 2020-09-22 ENCOUNTER — Other Ambulatory Visit: Payer: Self-pay

## 2020-09-22 VITALS — BP 97/69 | HR 85 | Temp 97.2°F | Resp 17 | Ht 67.0 in

## 2020-09-22 DIAGNOSIS — Z7189 Other specified counseling: Secondary | ICD-10-CM | POA: Diagnosis not present

## 2020-09-22 DIAGNOSIS — Z992 Dependence on renal dialysis: Secondary | ICD-10-CM | POA: Diagnosis not present

## 2020-09-22 DIAGNOSIS — E1122 Type 2 diabetes mellitus with diabetic chronic kidney disease: Secondary | ICD-10-CM | POA: Insufficient documentation

## 2020-09-22 DIAGNOSIS — I10 Essential (primary) hypertension: Secondary | ICD-10-CM | POA: Diagnosis not present

## 2020-09-22 DIAGNOSIS — C189 Malignant neoplasm of colon, unspecified: Secondary | ICD-10-CM | POA: Insufficient documentation

## 2020-09-22 DIAGNOSIS — G629 Polyneuropathy, unspecified: Secondary | ICD-10-CM | POA: Diagnosis not present

## 2020-09-22 DIAGNOSIS — C182 Malignant neoplasm of ascending colon: Secondary | ICD-10-CM

## 2020-09-22 DIAGNOSIS — Z5111 Encounter for antineoplastic chemotherapy: Secondary | ICD-10-CM | POA: Diagnosis not present

## 2020-09-22 DIAGNOSIS — I129 Hypertensive chronic kidney disease with stage 1 through stage 4 chronic kidney disease, or unspecified chronic kidney disease: Secondary | ICD-10-CM | POA: Insufficient documentation

## 2020-09-22 DIAGNOSIS — C786 Secondary malignant neoplasm of retroperitoneum and peritoneum: Secondary | ICD-10-CM | POA: Diagnosis not present

## 2020-09-22 DIAGNOSIS — C649 Malignant neoplasm of unspecified kidney, except renal pelvis: Secondary | ICD-10-CM | POA: Diagnosis not present

## 2020-09-22 DIAGNOSIS — N186 End stage renal disease: Secondary | ICD-10-CM | POA: Insufficient documentation

## 2020-09-22 DIAGNOSIS — M1990 Unspecified osteoarthritis, unspecified site: Secondary | ICD-10-CM | POA: Diagnosis not present

## 2020-09-22 DIAGNOSIS — F32A Depression, unspecified: Secondary | ICD-10-CM | POA: Diagnosis not present

## 2020-09-22 LAB — CBC WITH DIFFERENTIAL (CANCER CENTER ONLY)
Abs Immature Granulocytes: 0.01 10*3/uL (ref 0.00–0.07)
Basophils Absolute: 0 10*3/uL (ref 0.0–0.1)
Basophils Relative: 1 %
Eosinophils Absolute: 0.1 10*3/uL (ref 0.0–0.5)
Eosinophils Relative: 3 %
HCT: 28.4 % — ABNORMAL LOW (ref 36.0–46.0)
Hemoglobin: 9.1 g/dL — ABNORMAL LOW (ref 12.0–15.0)
Immature Granulocytes: 0 %
Lymphocytes Relative: 27 %
Lymphs Abs: 1.2 10*3/uL (ref 0.7–4.0)
MCH: 32.9 pg (ref 26.0–34.0)
MCHC: 32 g/dL (ref 30.0–36.0)
MCV: 102.5 fL — ABNORMAL HIGH (ref 80.0–100.0)
Monocytes Absolute: 0.4 10*3/uL (ref 0.1–1.0)
Monocytes Relative: 8 %
Neutro Abs: 2.9 10*3/uL (ref 1.7–7.7)
Neutrophils Relative %: 61 %
Platelet Count: 175 10*3/uL (ref 150–400)
RBC: 2.77 MIL/uL — ABNORMAL LOW (ref 3.87–5.11)
RDW: 19.2 % — ABNORMAL HIGH (ref 11.5–15.5)
WBC Count: 4.7 10*3/uL (ref 4.0–10.5)
nRBC: 0 % (ref 0.0–0.2)

## 2020-09-22 LAB — CMP (CANCER CENTER ONLY)
ALT: 11 U/L (ref 0–44)
AST: 14 U/L — ABNORMAL LOW (ref 15–41)
Albumin: 3 g/dL — ABNORMAL LOW (ref 3.5–5.0)
Alkaline Phosphatase: 65 U/L (ref 38–126)
Anion gap: 12 (ref 5–15)
BUN: 20 mg/dL (ref 8–23)
CO2: 31 mmol/L (ref 22–32)
Calcium: 9.1 mg/dL (ref 8.9–10.3)
Chloride: 95 mmol/L — ABNORMAL LOW (ref 98–111)
Creatinine: 5.07 mg/dL (ref 0.44–1.00)
GFR, Estimated: 9 mL/min — ABNORMAL LOW (ref >60)
Glucose, Bld: 154 mg/dL — ABNORMAL HIGH (ref 70–99)
Potassium: 5.1 mmol/L (ref 3.5–5.1)
Sodium: 138 mmol/L (ref 135–145)
Total Bilirubin: 0.4 mg/dL (ref 0.3–1.2)
Total Protein: 6.8 g/dL (ref 6.5–8.1)

## 2020-09-22 LAB — RETIC PANEL
Immature Retic Fract: 12.9 % (ref 2.3–15.9)
RBC.: 2.73 MIL/uL — ABNORMAL LOW (ref 3.87–5.11)
Retic Count, Absolute: 92.8 10*3/uL (ref 19.0–186.0)
Retic Ct Pct: 3.4 % — ABNORMAL HIGH (ref 0.4–3.1)
Reticulocyte Hemoglobin: 31.6 pg (ref >27.9)

## 2020-09-22 NOTE — Progress Notes (Signed)
[place] [phone]   [phone]  PROGRESS NOTE  Patient Care Team: [name], MD as PCP - General (Family Medicine) Constance Haw, MD as PCP - Electrophysiology (Cardiology) Lorretta Harp, MD as PCP - Cardiology (Cardiology) Edythe Clarity, [facility] as Pharmacist (Pharmacist)  Hematological/Oncological History # Metastatic Colon Cancer (Pathology staging pT4a, pN2a,  PM1c)  08/27/2020-09/07/2020: admitted to [facility] hospital with acute abdominal pain, intractable nausea and vomiting. CT abdomen and pelvis revealed small and large bowel obstruction secondary to previously demonstrated colonic mass in the ascending colon 08/29/2020: Colonoscopy performed, biopsy of malignant tumor showed a poorly differentiated adenocarcinoma 08/30/2020: underwent a Hemicolectomy, pathology revealed invasive poorly differentiated adenocarcinoma with mucinous and signet ring cell features, 5 cm, involving the ascending colon. Pathological staging pT4a, pN2a, pM1c  10/05/2020: planned Cycle 1 Day 1 of FOLFOX Bevacizumab  Interval History:  [name] 70 y.o. female with medical history significant for metastatic colon cancer who presents for a follow up visit. The patient was last seen during her hospitalization for a partial bowel resection. In the interim since the last visit she has been recovering well from her surgery.   On exam today [name] that she is recovering well from her surgery.  She is had increasing energy and her appetite is getting better.  She reports that she is also shortly to be discharged from her skilled nursing facility and will be going back home on Sunday.  She currently denies having issues with fevers, chills, sweats, nausea, or diarrhea.  She is embarrassed that her pouch does make flatulent sounds.  She notes it is producing liquid brown stool with no evidence of blood or dark stools.  Additionally she is not having any abdominal  pain.  A full 10 point ROS is listed below.  The bulk of our discussion focused on the diagnosis of metastatic colon cancer and the treatment options.  The details of his conversation are listed below.  MEDICAL HISTORY:  Past Medical History:  Diagnosis Date  . Anemia   . Anemia associated with chronic renal failure   . Anemia in chronic kidney disease 09/29/2018  . Arthritis   . Asthma   . Blood transfusion without reported diagnosis    Phreesia 02/27/2020  . Cancer ([place])    Phreesia 02/27/2020  . Cataract    OD  . Chronic kidney disease    Phreesia 02/27/2020  . Coronary artery calcification seen on CAT scan 02/17/2018   Coronary calcification on CT  . Depression   . Diabetes mellitus without complication ([name])    Phreesia 02/27/2020  . Diabetic retinopathy of both eyes ([place])   . Diabetic ulcer of right foot associated with type 2 diabetes mellitus ([place])   . ESRD (end stage renal disease) on dialysis ([place])   . Essential hypertension 02/17/2018   Essential hypertension  . GERD (gastroesophageal reflux disease)    pt denies  . HLD (hyperlipidemia)   . Hypertension   . Hypertensive retinopathy    OU  . Left ventricular dysfunction 04/07/2018   Left ventricle dysfunction  . Microalbuminuria due to type 2 diabetes mellitus ([place])   . Neuromuscular disorder ([place])    diabetic neuropathy  . Nonproliferative retinopathy due to secondary diabetes ([place])   . Nonsustained ventricular tachycardia ([place]) 08/28/2018   Nonsustained ventricular tachycardia  . Pneumonia    hx of   . Renal cell cancer ([place])   . Renal insufficiency   . Right renal  mass 03/10/2017  . Type 2 diabetes, controlled, with neuropathy ([place]) 06/22/2013  . Ulcer of other part of foot 06/22/2013  . 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 AMPUTATION TOE Right 10/29/2019   Procedure: RIGHT 2ND TOE AMPUTATION;  Surgeon:  Edrick Kins, DPM;  Location: [facility];  Service: Podiatry;  Laterality: Right;  . AMPUTATION TOE Bilateral 09/04/2020   Procedure: RIGHT THIRD DIGIT AMPUTATION;  Surgeon: Edrick Kins, DPM;  Location: [place];  Service: Podiatry;  Laterality: Bilateral;  . BASCILIC VEIN TRANSPOSITION Left 08/08/2017   Procedure: BASILIC VEIN TRANSPOSITION FIRST STAGE LEFT ARM;  Surgeon: Serafina Mitchell, MD;  Location: [place];  Service: Vascular;  Laterality: Left;  . BASCILIC VEIN TRANSPOSITION Left 05/14/2018   Procedure: SECOND STAGE BASILIC VEIN TRANSPOSITION LEFT ARM;  Surgeon: Serafina Mitchell, MD;  Location: [place];  Service: Vascular;  Laterality: Left;  . BIOPSY  08/29/2020   Procedure: BIOPSY;  Surgeon: Yetta Flock, MD;  Location: [place];  Service: Gastroenterology;;  . CATARACT EXTRACTION Left   . COLONOSCOPY WITH PROPOFOL N/A 08/29/2020   Procedure: COLONOSCOPY WITH PROPOFOL;  Surgeon: Yetta Flock, MD;  Location: [place];  Service: Gastroenterology;  Laterality: N/A;  . EYE SURGERY    . ILEOSTOMY Right 08/30/2020   Procedure: ILEOSTOMY;  Surgeon: Jesusita Oka, MD;  Location: [place];  Service: General;  Laterality: Right;  . PARTIAL COLECTOMY N/A 08/30/2020   Procedure: HEMI COLECTOMY;  Surgeon: Jesusita Oka, MD;  Location: [place];  Service: General;  Laterality: N/A;  . POLYPECTOMY  08/29/2020   Procedure: POLYPECTOMY;  Surgeon: Yetta Flock, MD;  Location: [facility];  Service: Gastroenterology;;  . ROBOTIC ADRENALECTOMY Left 03/10/2017   Procedure: XI ROBOTIC ADRENALECTOMY;  Surgeon: Nickie Retort, MD;  Location: [facility];  Service: Urology;  Laterality: Left;  . ROBOTIC ASSITED PARTIAL NEPHRECTOMY Right 03/10/2017   Procedure: XI ROBOTIC ASSITED RADICAL NEPHRECTOMY;  Surgeon: Nickie Retort, MD;  Location: [facility];  Service: Urology;  Laterality: Right;  . SUBMUCOSAL TATTOO INJECTION  08/29/2020   Procedure: SUBMUCOSAL TATTOO INJECTION;  Surgeon: Yetta Flock, MD;  Location: [facility];  Service: Gastroenterology;;  . TOE AMPUTATION Bilateral    both great toe ,, left foot 2nd toe 1/2  . TOE ARTHROPLASTY  09/04/2020   Procedure: ARTHROPLASTY OF THE THIRD DIGIT LEFT FOOT;  Surgeon: Edrick Kins, DPM;  Location: [place] [facility];  Service: Podiatry;;    SOCIAL HISTORY: Social History   Socioeconomic History  . Marital status: Single    Spouse name: Not on file  . Number of children: 2  . Years of education: 9  . Highest education level: Not on file  Occupational History  . Not on file  Tobacco Use  . Smoking status: Never Smoker  . Smokeless tobacco: Never Used  Vaping Use  . Vaping Use: Never used  Substance and Sexual Activity  . Alcohol use: Yes    Alcohol/week: 0.0 standard drinks    Comment: occ  . Drug use: No  . Sexual activity: Not Currently    Partners: Male  Other Topics Concern  . Not on file  Social History Narrative  . Not on file   Social Determinants of Health   Financial Resource Strain: Low Risk   . Difficulty of Paying Living Expenses: Not very hard  Food Insecurity: Not on  file  Transportation Needs: Not on file  Physical Activity: Not on file  Stress: Not on file  Social Connections: Not on file  Intimate Partner Violence: Not on file    FAMILY HISTORY: Family History  Problem Relation Age of Onset  . Heart disease Mother   . Diabetes Sister     ALLERGIES:  is allergic to latex, penicillins, and adhesive [tape].  MEDICATIONS:  Current Outpatient Medications  Medication Sig Dispense Refill  . Blood Glucose Monitoring Suppl (ONE TOUCH ULTRA 2) w/Device KIT Use to check BS BID-QID Dx:E11.9 1 each 1  . BREO ELLIPTA 100-25 MCG/INH AEPB Inhale 1 puff by mouth once daily (Patient taking differently: Inhale 1 puff into the lungs daily.) 180 each 0  . Calcium Carbonate Antacid (TUMS PO) Take 1 tablet by mouth 3 (three) times daily with meals.    . Carboxymethylcellulose Sodium (THERATEARS OP) Place  1 drop into both eyes daily as needed (dry eyes).    . diphenhydrAMINE (BENADRYL) 25 MG tablet Take 25 mg by mouth daily as needed for allergies or itching. (Patient not taking: Reported on 09/22/2020)    . doxercalciferol (HECTOROL) 4 MCG/2ML injection Inject 1.5 mLs (3 mcg total) into the vein Every Tuesday,Thursday,and Saturday with dialysis. 2 mL   . gabapentin (NEURONTIN) 300 MG capsule TAKE 1 CAPSULE BY MOUTH THREE TIMES DAILY. REQUIRES OFFICE VISIT BEFORE ANY FURTHER REFILLS CAN BE GIVEN (Patient taking differently: Take 300 mg by mouth 3 (three) times daily as needed (nerve pain).) 90 capsule 0  . insulin aspart (NOVOLOG) 100 UNIT/ML injection Inject 0-6 Units into the skin 3 (three) times daily with meals. Sliding scale insulin Less than 70 initiate hypoglycemia protocol 70-120  0 units 120-150 0 unit 151-200 1 units 201-250 2 units 251-300 3 units 301-350 4 units 351-400 5 units Greater than 400 call MD and give 6 units 10 mL 11  . insulin degludec (TRESIBA FLEXTOUCH) 100 UNIT/ML FlexTouch Pen Inject 20 Units into the skin daily. Hold if fasting blood sugars are <130. 45 mL 0  . Insulin Pen Needle (LIVE BETTER PEN NEEDLES) 31G X 6 MM MISC To use with Tresiba pens daily 100 each 3  . Lancets (ONETOUCH ULTRASOFT) lancets Use as instructed 300 each 3  . midodrine (PROAMATINE) 5 MG tablet Take 1 tablet (5 mg total) by mouth Every Tuesday,Thursday,and Saturday with dialysis.    Marland Kitchen multivitamin (RENA-VIT) TABS tablet Take 1 tablet by mouth daily.    Glory Rosebush ULTRA test strip USE AS DIRECTED TO MONITOR  FSBS 3 TIMES DAILY 300 strip 3  . oxyCODONE (ROXICODONE) 5 MG immediate release tablet Take 1 tablet (5 mg total) by mouth every 8 (eight) hours as needed for up to 10 doses. (Patient not taking: Reported on 09/22/2020) 10 tablet 0  . PROAIR HFA 108 (90 Base) MCG/ACT inhaler INHALE 2 PUFFS BY MOUTH EVERY 6 HOURS AS NEEDED FOR WHEEZING FOR SHORTNESS OF BREATH (Patient taking differently: Inhale 2 puffs  into the lungs every 6 (six) hours as needed for wheezing or shortness of breath.) 9 g 0  . rosuvastatin (CRESTOR) 20 MG tablet Take 1 tablet (20 mg total) by mouth daily. 90 tablet 3  . SANTYL ointment Apply 1 application topically daily as needed (wound care).    . traZODone (DESYREL) 50 MG tablet Take 0.5 tablets (25 mg total) by mouth at bedtime as needed for sleep. (Patient not taking: Reported on 09/22/2020)     No current facility-administered medications for this  visit.    REVIEW OF SYSTEMS:   Constitutional: ( - ) fevers, ( - )  chills , ( - ) night sweats Eyes: ( - ) blurriness of vision, ( - ) double vision, ( - ) watery eyes Ears, nose, mouth, throat, and face: ( - ) mucositis, ( - ) sore throat Respiratory: ( - ) cough, ( - ) dyspnea, ( - ) wheezes Cardiovascular: ( - ) palpitation, ( - ) chest discomfort, ( - ) lower extremity swelling Gastrointestinal:  ( - ) nausea, ( - ) heartburn, ( - ) change in bowel habits Skin: ( - ) abnormal skin rashes Lymphatics: ( - ) new lymphadenopathy, ( - ) easy bruising Neurological: ( - ) numbness, ( - ) tingling, ( - ) new weaknesses Behavioral/Psych: ( - ) mood change, ( - ) new changes  All other systems were reviewed with the patient and are negative.  PHYSICAL EXAMINATION: ECOG PERFORMANCE STATUS: 1 - Symptomatic but completely ambulatory  Vitals:   09/22/20 1403  BP: 97/69  Pulse: 85  Resp: 17  Temp: (!) 97.2 F (36.2 C)  SpO2: 100%   Filed Weights    GENERAL: chronically ill appearing elderly Caucasian female. alert, no distress and comfortable SKIN: skin color, texture, turgor are normal, no rashes or significant lesions EYES: conjunctiva are pink and non-injected, sclera clear LUNGS: clear to auscultation and percussion with normal breathing effort HEART: regular rate & rhythm and no murmurs and no lower extremity edema ABDOMEN: soft, non-tender, non-distended, normal bowel sounds. Well healing surgical scar, stables  removed. Ostomy in place producing brown liquid stool.  Musculoskeletal: no cyanosis of digits and no clubbing  PSYCH: alert & oriented x 3, fluent speech NEURO: no focal motor/sensory deficits  LABORATORY DATA:  I have reviewed the data as listed CBC Latest Ref Rng & Units 09/07/2020 09/06/2020 09/05/2020  WBC 4.0 - 10.5 K/uL 6.8 6.4 5.9  Hemoglobin 12.0 - 15.0 g/dL 7.6(L) 7.7(L) 7.2(L)  Hematocrit 36.0 - 46.0 % 23.3(L) 22.5(L) 22.2(L)  Platelets 150 - 400 K/uL 148(L) 121(L) 114(L)    CMP Latest Ref Rng & Units 09/07/2020 09/06/2020 09/04/2020  Glucose 70 - 99 mg/dL 115(H) 146(H) 136(H)  BUN 8 - 23 mg/dL 41(H) 28(H) 35(H)  Creatinine 0.44 - 1.00 mg/dL 7.14(H) 5.16(H) 6.94(H)  Sodium 135 - 145 mmol/L 132(L) 133(L) 132(L)  Potassium 3.5 - 5.1 mmol/L 4.4 4.3 3.7  Chloride 98 - 111 mmol/L 96(L) 97(L) 94(L)  CO2 22 - 32 mmol/L 21(L) 22 23  Calcium 8.9 - 10.3 mg/dL 8.0(L) 7.5(L) 7.6(L)  Total Protein 6.5 - 8.1 g/dL - - -  Total Bilirubin 0.3 - 1.2 mg/dL - - -  Alkaline Phos 38 - 126 U/L - - -  AST 15 - 41 U/L - - -  ALT 0 - 44 U/L - - -   RADIOGRAPHIC STUDIES: CT ABDOMEN PELVIS WO CONTRAST  Result Date: 08/27/2020 CLINICAL DATA:  Nephrectomy for right-sided renal cell carcinoma. Concern for bowel obstruction. EXAM: CT ABDOMEN AND PELVIS WITHOUT CONTRAST TECHNIQUE: Multidetector CT imaging of the abdomen and pelvis was performed following the standard protocol without IV contrast. COMPARISON:  June 28, 2020 FINDINGS: Lower chest: The lung bases are clear. The heart size is normal. Hepatobiliary: The liver is normal. Normal gallbladder.There is no biliary ductal dilation. Pancreas: Normal contours without ductal dilatation. No peripancreatic fluid collection. Spleen: Unremarkable. Adrenals/Urinary Tract: --Adrenal glands: Unremarkable. --Right kidney/ureter: The right kidney is surgically absent. --Left kidney/ureter: The left kidney  appears atrophic. --Urinary bladder: Unremarkable.  Stomach/Bowel: --Stomach/Duodenum: No hiatal hernia or other gastric abnormality. Normal duodenal course and caliber. --Small bowel: There are mildly dilated loops of small bowel scattered throughout the abdomen to the level of the patient's ileocecal valve. --Colon: There is moderate distention of the cecum. There is an above transition point at the level of the ascending colon with there is a probable underlying colonic mass. The remaining portions of the patient's colon are completely decompressed aside from the rectum. --Appendix: Normal. Vascular/Lymphatic: Atherosclerotic calcification is present within the non-aneurysmal abdominal aorta, without hemodynamically significant stenosis. --No retroperitoneal lymphadenopathy. --there are few mildly enlarged mesenteric lymph nodes in the right lower quadrant and right mid abdomen. --No pelvic or inguinal lymphadenopathy. Reproductive: Unremarkable Other: No ascites or free air. The abdominal wall is normal. Musculoskeletal. No acute displaced fractures. IMPRESSION: 1. Small and large bowel obstruction that appears to be secondary to the previously demonstrated colonic mass in the ascending colon. 2. There are few mildly enlarged mesenteric lymph nodes in the right lower quadrant and right mid abdomen, concerning for nodal metastatic disease. 3. Status post right nephrectomy. 4. Atrophic left kidney. Aortic Atherosclerosis (ICD10-I70.0). Electronically Signed   By: Constance Holster M.D.   On: 08/27/2020 19:51   DG Abdomen 1 View  Result Date: 08/28/2020 CLINICAL DATA:  Nasogastric tube placement. EXAM: ABDOMEN - 1 VIEW COMPARISON:  CT abdomen and pelvis 08/27/2020 FINDINGS: The tip of the nasogastric tube is near the GE junction. There is gas in the stomach. Gas within bowel loops in the mid abdomen and left lower abdomen. Limited evaluation for free air on the supine image. IMPRESSION: 1. Nasogastric tube tip is near the GE junction. 2. Persistent gas-filled  loops of bowel in the abdomen. Findings remain compatible with a bowel obstruction. Electronically Signed   By: Markus Daft M.D.   On: 08/28/2020 07:49   CT CHEST W CONTRAST  Result Date: 08/29/2020 CLINICAL DATA:  Staging workup of ascending colon cancer. Prior right renal cell carcinoma. EXAM: CT CHEST WITH CONTRAST TECHNIQUE: Multidetector CT imaging of the chest was performed during intravenous contrast administration. CONTRAST:  43m OMNIPAQUE IOHEXOL 300 MG/ML  SOLN COMPARISON:  CT abdomen 08/27/2020 and CT chest 06/28/2020 FINDINGS: Cardiovascular: Coronary, aortic arch, and branch vessel atherosclerotic vascular disease. Mildly prominent right ventricle and overall mild cardiomegaly. Mitral and aortic valve calcifications. Mediastinum/Nodes: Lower paratracheal node anterior to the carina measures 2.1 cm in short axis on image 57 series 3, previously the same by my measurements. Right hilar lymph node measures 1.2 cm in short axis on image 62 of series 3, previously likely similar on 06/28/2020 although difficult to measure on that exam due to the lack of IV contrast. A nasogastric tube is present and terminates within a type 1 hiatal hernia. Lungs/Pleura: Small right and trace left pleural effusions. Mild scarring or atelectasis in the lingula and lower lobes. 4 by 5 mm lingular nodule on image 82 series 4, measuring 0.3 cm in diameter on 11/04/2018 and 05/13/2019 and 0.4 by 0.3 cm on 12/01/2019. Accordingly this nodule is slowly enlarging and merits surveillance, but is still too small for PET CT characterization. Upper Abdomen: Perihepatic ascites. Left adrenalectomy. Atherosclerosis. Musculoskeletal: Degenerative glenohumeral arthropathy bilaterally. Thoracic spondylosis. IMPRESSION: 1. 4 by 5 mm lingular nodule, slowly enlarging over the last 2 years, but still too small for PET CT characterization. This could be inflammatory or neoplastic. Surveillance is likely warranted. 2. Stable paratracheal and  right hilar adenopathy. 3. Small  right and trace left pleural effusions. 4. Other imaging findings of potential clinical significance: Coronary, aortic arch, and branch vessel atherosclerotic vascular disease. Mildly prominent right ventricle and overall mild cardiomegaly. Mitral and aortic valve calcifications. Perihepatic ascites. Left adrenalectomy. Degenerative glenohumeral arthropathy bilaterally. 5. Aortic atherosclerosis. Aortic Atherosclerosis (ICD10-I70.0). Electronically Signed   By: Van Clines M.D.   On: 08/29/2020 19:22    ASSESSMENT & PLAN [name] 70 y.o. female with medical history significant for metastatic colon cancer who presents for a follow up visit.   After review the labs, the records, schedule the patient the findings most consistent with metastatic colon cancer.  Today we discussed with the patient the incurable nature of this disease and the treatment options moving forward.  Given her ESRD this will complicate treatment and potentially worsen her prognosis.  Additionally it may make the chemotherapy more toxic and dose adjustments will be required.  We discussed this with the patient who is in agreement with moving forward with chemotherapy treatment.  We will plan to administer FOLFOX plus bevacizumab as palliative chemotherapy.  Dose adjustments for this are to include a 30% reduction in oxaliplatin and no dose reduction in 5-FU (literature citations noted below).   # Metastatic Colon Cancer (Pathology staging pT4a, pN2a,  PM1c)  --Findings are most consistent with a metastatic colon cancer with metastasis to the peritoneal surface.   --Unfortunately the nature of this disease is incurable.  All chemotherapy moving forward would be palliative.  The patient voiced her understanding of this. --We will plan to start FOLFOX plus bevacizumab chemotherapy for palliative treatment.  Tentatively we will plan to start on October 05, 2020. --Plan for the patient to return  on cycle 1 day 1 of FOLFOX plus bevacizumab.  #Supportive Care --chemotherapy education to be scheduled --port placement to be scheduled  --zofran 91m q8H PRN and compazine 193mPO q6H for nausea -- EMLA cream for port -- no pain medication required at this time.    No orders of the defined types were placed in this encounter.   All questions were answered. The patient knows to call the clinic with any problems, questions or concerns.  A total of more than 30 minutes were spent on this encounter and over half of that time was spent on counseling and coordination of care as outlined above.   JoLedell PeoplesMD Department of Hematology/Oncology [place] WeTrist[facility] Stonecrest Medical Centerhone: [phone]: [phone]: [email or web address]  09/22/2020 2:40 PM   Literature Support:  PeValetta FullerSiLanae Boastet al. Management of patients with end-stage renal disease undergoing chemotherapy: recommendations of the [place]) and the [place] ESMO Open. 2017;2(3):e000167. Published 2017 Jul 19. [id number]--Some authors have evaluated its use in polychemotherapy with doses between 40 and 85?mg/m2, but the results, in terms of both effectiveness and safety, are not unequivocal. Given its high dialysability, oxaliplatin should be administered after dialysis or in a day without dialysis.   N. Janus, J. ThDorthy CoolerProposal for dosage adjustment and timing of chemotherapy in hemodialyzed patients Annals of Oncology, Volume 21, Issue 7, 2010,Pages 1395-1403  --These observations indicate that 5-FU may be used at its usual dosage in patients with ESRD undergoing HD. It is however recommended to administer the drug after HD session on HD days since the drug may be removed.  --Since the dialysis removal rate is >80% the  administration of the drug might be best done after HD sessions or on nondialysis days. Owing to the lack of convincing data on i.v. infusions, oxalipatin cannot be recommended in HD patients. However, when it is mandatory, we might indicate a starting intravenous dose of oxaliplatin reduced by 30%. oxaliplatin. However, there are no information on the efficacy and the safety of oxaliplatin in HD patients at such a dose.

## 2020-09-22 NOTE — Progress Notes (Signed)
Met with patient at hospital follow up with Dr. Lorenso Courier.  I explained my role as nurse navigator and she was given my card with my direct contact information.  She has had her surgical staples in for almost 4 weeks. She was to follow up at [facility] after discharge for these to be removed but states she never heard from their office.  With Dr. Libby Maw permission I removed 14 staples without difficulty using staple removal tool.  The incision is well approximated without signs of infection.  Patient tolerated this well.  The patient has contact information for [facility] and she is going to reach out to them for additional ostomy supplies.

## 2020-09-23 DIAGNOSIS — D689 Coagulation defect, unspecified: Secondary | ICD-10-CM | POA: Diagnosis not present

## 2020-09-23 DIAGNOSIS — Z992 Dependence on renal dialysis: Secondary | ICD-10-CM | POA: Diagnosis not present

## 2020-09-23 DIAGNOSIS — N186 End stage renal disease: Secondary | ICD-10-CM | POA: Diagnosis not present

## 2020-09-23 DIAGNOSIS — N2581 Secondary hyperparathyroidism of renal origin: Secondary | ICD-10-CM | POA: Diagnosis not present

## 2020-09-24 ENCOUNTER — Encounter: Payer: Self-pay | Admitting: Hematology and Oncology

## 2020-09-24 DIAGNOSIS — C189 Malignant neoplasm of colon, unspecified: Secondary | ICD-10-CM | POA: Insufficient documentation

## 2020-09-24 NOTE — Progress Notes (Signed)
START ON PATHWAY REGIMEN - Colorectal     A cycle is every 14 days:     Bevacizumab-xxxx      Oxaliplatin      Leucovorin      Fluorouracil      Fluorouracil   **Always confirm dose/schedule in your pharmacy ordering system**  Patient Characteristics: Distant Metastases, Nonsurgical Candidate, KRAS/NRAS Mutation Positive/Unknown (BRAF V600 Wild-Type/Unknown), Standard Cytotoxic Therapy, First Line Standard Cytotoxic Therapy, Bevacizumab Eligible, PS = 0,1 Tumor Location: Colon Therapeutic Status: Distant Metastases Microsatellite/Mismatch Repair Status: Unknown BRAF Mutation Status: Awaiting Test Results KRAS/NRAS Mutation Status: Awaiting Test Results Standard Cytotoxic Line of Therapy: First Line Standard Cytotoxic Therapy ECOG Performance Status: 1 Bevacizumab Eligibility: Eligible Intent of Therapy: Non-Curative / Palliative Intent, Discussed with Patient 

## 2020-09-25 ENCOUNTER — Telehealth: Payer: Self-pay | Admitting: Hematology and Oncology

## 2020-09-25 ENCOUNTER — Telehealth: Payer: Self-pay | Admitting: *Deleted

## 2020-09-25 DIAGNOSIS — G629 Polyneuropathy, unspecified: Secondary | ICD-10-CM | POA: Diagnosis not present

## 2020-09-25 DIAGNOSIS — K219 Gastro-esophageal reflux disease without esophagitis: Secondary | ICD-10-CM | POA: Diagnosis not present

## 2020-09-25 DIAGNOSIS — N186 End stage renal disease: Secondary | ICD-10-CM | POA: Diagnosis not present

## 2020-09-25 DIAGNOSIS — I739 Peripheral vascular disease, unspecified: Secondary | ICD-10-CM | POA: Diagnosis not present

## 2020-09-25 LAB — IRON AND TIBC
Iron: 34 ug/dL — ABNORMAL LOW (ref 41–142)
Saturation Ratios: 17 % — ABNORMAL LOW (ref 21–57)
TIBC: 199 ug/dL — ABNORMAL LOW (ref 236–444)
UIBC: 165 ug/dL (ref 120–384)

## 2020-09-25 LAB — FERRITIN: Ferritin: 908 ng/mL — ABNORMAL HIGH (ref 11–307)

## 2020-09-25 LAB — CEA (IN HOUSE-CHCC): CEA (CHCC-In House): 3.02 ng/mL (ref 0.00–5.00)

## 2020-09-25 NOTE — Telephone Encounter (Signed)
Scheduled per los. Called and spoke with patient. Patient stated that she has dialysis on thursdays. She will try and get dialysis moved to different day. Patient would like for me to call her back in 2 days to see if she was able to move her dialysis or if we need to move her treatment to a different day.

## 2020-09-25 NOTE — Telephone Encounter (Signed)
Schedules appointments per 03/06 schedule message. Attempted to contact patient, left a detailed message.

## 2020-09-25 NOTE — Telephone Encounter (Signed)
TCT to patient's son, Lennette Bihari with appt date and time for pt's port placement. He wrote it done and voiced understanding

## 2020-09-25 NOTE — Telephone Encounter (Signed)
Scheduled chemo education class per 3/7 schedule message. Patient is aware.

## 2020-09-26 DIAGNOSIS — D631 Anemia in chronic kidney disease: Secondary | ICD-10-CM | POA: Diagnosis not present

## 2020-09-26 DIAGNOSIS — N2581 Secondary hyperparathyroidism of renal origin: Secondary | ICD-10-CM | POA: Diagnosis not present

## 2020-09-26 DIAGNOSIS — N186 End stage renal disease: Secondary | ICD-10-CM | POA: Diagnosis not present

## 2020-09-26 DIAGNOSIS — D689 Coagulation defect, unspecified: Secondary | ICD-10-CM | POA: Diagnosis not present

## 2020-09-26 DIAGNOSIS — D509 Iron deficiency anemia, unspecified: Secondary | ICD-10-CM | POA: Diagnosis not present

## 2020-09-26 DIAGNOSIS — Z992 Dependence on renal dialysis: Secondary | ICD-10-CM | POA: Diagnosis not present

## 2020-09-27 ENCOUNTER — Encounter: Payer: Medicare Other | Admitting: Podiatry

## 2020-09-27 ENCOUNTER — Ambulatory Visit (INDEPENDENT_AMBULATORY_CARE_PROVIDER_SITE_OTHER): Payer: Medicare Other | Admitting: Podiatry

## 2020-09-27 ENCOUNTER — Other Ambulatory Visit: Payer: Self-pay

## 2020-09-27 DIAGNOSIS — Z9889 Other specified postprocedural states: Secondary | ICD-10-CM

## 2020-09-27 DIAGNOSIS — L97412 Non-pressure chronic ulcer of right heel and midfoot with fat layer exposed: Secondary | ICD-10-CM

## 2020-09-28 ENCOUNTER — Other Ambulatory Visit: Payer: Self-pay | Admitting: Hematology and Oncology

## 2020-09-28 DIAGNOSIS — Z992 Dependence on renal dialysis: Secondary | ICD-10-CM | POA: Diagnosis not present

## 2020-09-28 DIAGNOSIS — N2581 Secondary hyperparathyroidism of renal origin: Secondary | ICD-10-CM | POA: Diagnosis not present

## 2020-09-28 DIAGNOSIS — D689 Coagulation defect, unspecified: Secondary | ICD-10-CM | POA: Diagnosis not present

## 2020-09-28 DIAGNOSIS — N186 End stage renal disease: Secondary | ICD-10-CM | POA: Diagnosis not present

## 2020-09-28 DIAGNOSIS — D509 Iron deficiency anemia, unspecified: Secondary | ICD-10-CM | POA: Diagnosis not present

## 2020-09-28 DIAGNOSIS — D631 Anemia in chronic kidney disease: Secondary | ICD-10-CM | POA: Diagnosis not present

## 2020-09-28 NOTE — Progress Notes (Signed)
Pharmacist Chemotherapy Monitoring - Initial Assessment    Anticipated start date: 10/05/20   Regimen:  . Are orders appropriate based on the patient's diagnosis, regimen, and cycle? Yes . Does the plan date match the patient's scheduled date? Yes . Is the sequencing of drugs appropriate? Yes . Are the premedications appropriate for the patient's regimen? Yes . Prior Authorization for treatment is: Approved o If applicable, is the correct biosimilar selected based on the patient's insurance? yes  Organ Function and Labs: Marland Kitchen Are dose adjustments needed based on the patient's renal function, hepatic function, or hematologic function? Yes- MD notes plan to reduce Oxaliplatin dose given pt is on HD. Marland Kitchen Are appropriate labs ordered prior to the start of patient's treatment? No . Other organ system assessment, if indicated: bevacizumab: baseline BP . The following baseline labs, if indicated, have been ordered: bevacizumab: urine protein  Dose Assessment: . Are the drug doses appropriate? Yes . Are the following correct: o Drug concentrations Yes o IV fluid compatible with drug Yes o Administration routes Yes o Timing of therapy Yes . If applicable, does the patient have documented access for treatment and/or plans for port-a-cath placement? Yes - 10/02/20; will msg MD about plan for starting Zirabev . If applicable, have lifetime cumulative doses been properly documented and assessed? yes Lifetime Dose Tracking  No doses have been documented on this patient for the following tracked chemicals: Doxorubicin, Epirubicin, Idarubicin, Daunorubicin, Mitoxantrone, Bleomycin, Oxaliplatin, Carboplatin, Liposomal Doxorubicin  o   Toxicity Monitoring/Prevention: . The patient has the following take home antiemetics prescribed:Pt needs Rx for home antiemetics . The patient has the following take home medications prescribed: N/A . Medication allergies and previous infusion related reactions, if  applicable, have been reviewed and addressed. Yes . The patient's current medication list has been assessed for drug-drug interactions with their chemotherapy regimen. no significant drug-drug interactions were identified on review.  Order Review: . Are the treatment plan orders signed? No . Is the patient scheduled to see a provider prior to their treatment? Yes  I verify that I have reviewed each item in the above checklist and answered each question accordingly.   Kennith Center, Pharm.D., CPP 09/28/2020@12 :01 PM

## 2020-09-29 ENCOUNTER — Inpatient Hospital Stay: Payer: Medicare Other

## 2020-09-29 ENCOUNTER — Other Ambulatory Visit: Payer: Self-pay | Admitting: Radiology

## 2020-09-29 ENCOUNTER — Encounter: Payer: Self-pay | Admitting: Hematology and Oncology

## 2020-09-29 ENCOUNTER — Other Ambulatory Visit: Payer: Self-pay

## 2020-09-29 NOTE — Progress Notes (Signed)
Met with patient and accompanying adult to introduce myself as Arboriculturist and to offer available resources.  Discussed one-time $1000 Radio broadcast assistant to assist with personal expenses while going through treatment. Advised what is needed to apply and she may bring on 10/05/20 at her next appointment.  Gave her my card if interested in applying and for any additional financial questions or concerns.

## 2020-10-02 ENCOUNTER — Ambulatory Visit (HOSPITAL_COMMUNITY)
Admission: RE | Admit: 2020-10-02 | Discharge: 2020-10-02 | Disposition: A | Discharge status: Home / Self Care | Payer: Medicare Other | Source: Ambulatory Visit | Attending: Hematology and Oncology | Admitting: Hematology and Oncology

## 2020-10-02 ENCOUNTER — Other Ambulatory Visit: Payer: Self-pay

## 2020-10-02 ENCOUNTER — Telehealth: Payer: Self-pay

## 2020-10-02 ENCOUNTER — Encounter (HOSPITAL_COMMUNITY): Payer: Self-pay

## 2020-10-02 ENCOUNTER — Emergency Department (HOSPITAL_COMMUNITY)
Admission: EM | Admit: 2020-10-02 | Discharge: 2020-10-02 | Disposition: A | Discharge status: Home / Self Care | Payer: Medicare Other | Attending: Emergency Medicine | Admitting: Emergency Medicine

## 2020-10-02 ENCOUNTER — Encounter (HOSPITAL_COMMUNITY): Payer: Self-pay | Admitting: Emergency Medicine

## 2020-10-02 ENCOUNTER — Other Ambulatory Visit: Payer: Self-pay | Admitting: Hematology and Oncology

## 2020-10-02 DIAGNOSIS — X58XXXA Exposure to other specified factors, initial encounter: Secondary | ICD-10-CM | POA: Diagnosis not present

## 2020-10-02 DIAGNOSIS — S60519A Abrasion of unspecified hand, initial encounter: Secondary | ICD-10-CM | POA: Diagnosis not present

## 2020-10-02 DIAGNOSIS — Z452 Encounter for adjustment and management of vascular access device: Secondary | ICD-10-CM | POA: Diagnosis not present

## 2020-10-02 DIAGNOSIS — I132 Hypertensive heart and chronic kidney disease with heart failure and with stage 5 chronic kidney disease, or end stage renal disease: Secondary | ICD-10-CM | POA: Insufficient documentation

## 2020-10-02 DIAGNOSIS — Z992 Dependence on renal dialysis: Secondary | ICD-10-CM

## 2020-10-02 DIAGNOSIS — N186 End stage renal disease: Secondary | ICD-10-CM | POA: Insufficient documentation

## 2020-10-02 DIAGNOSIS — I10 Essential (primary) hypertension: Secondary | ICD-10-CM | POA: Diagnosis not present

## 2020-10-02 DIAGNOSIS — E875 Hyperkalemia: Secondary | ICD-10-CM | POA: Insufficient documentation

## 2020-10-02 DIAGNOSIS — J45909 Unspecified asthma, uncomplicated: Secondary | ICD-10-CM | POA: Diagnosis not present

## 2020-10-02 DIAGNOSIS — Z539 Procedure and treatment not carried out, unspecified reason: Secondary | ICD-10-CM | POA: Insufficient documentation

## 2020-10-02 DIAGNOSIS — Z794 Long term (current) use of insulin: Secondary | ICD-10-CM | POA: Diagnosis not present

## 2020-10-02 DIAGNOSIS — Z91040 Latex allergy status: Secondary | ICD-10-CM | POA: Insufficient documentation

## 2020-10-02 DIAGNOSIS — Z7951 Long term (current) use of inhaled steroids: Secondary | ICD-10-CM | POA: Diagnosis not present

## 2020-10-02 DIAGNOSIS — S6990XA Unspecified injury of unspecified wrist, hand and finger(s), initial encounter: Secondary | ICD-10-CM | POA: Diagnosis present

## 2020-10-02 DIAGNOSIS — C189 Malignant neoplasm of colon, unspecified: Secondary | ICD-10-CM

## 2020-10-02 DIAGNOSIS — Z85528 Personal history of other malignant neoplasm of kidney: Secondary | ICD-10-CM | POA: Insufficient documentation

## 2020-10-02 DIAGNOSIS — E1122 Type 2 diabetes mellitus with diabetic chronic kidney disease: Secondary | ICD-10-CM | POA: Insufficient documentation

## 2020-10-02 DIAGNOSIS — I509 Heart failure, unspecified: Secondary | ICD-10-CM | POA: Insufficient documentation

## 2020-10-02 DIAGNOSIS — I120 Hypertensive chronic kidney disease with stage 5 chronic kidney disease or end stage renal disease: Secondary | ICD-10-CM | POA: Diagnosis not present

## 2020-10-02 LAB — CBC WITH DIFFERENTIAL/PLATELET
Abs Immature Granulocytes: 0.07 10*3/uL (ref 0.00–0.07)
Basophils Absolute: 0.1 10*3/uL (ref 0.0–0.1)
Basophils Relative: 1 %
Eosinophils Absolute: 0.1 10*3/uL (ref 0.0–0.5)
Eosinophils Relative: 1 %
HCT: 33.1 % — ABNORMAL LOW (ref 36.0–46.0)
Hemoglobin: 10.5 g/dL — ABNORMAL LOW (ref 12.0–15.0)
Immature Granulocytes: 1 %
Lymphocytes Relative: 19 %
Lymphs Abs: 1.5 10*3/uL (ref 0.7–4.0)
MCH: 32.9 pg (ref 26.0–34.0)
MCHC: 31.7 g/dL (ref 30.0–36.0)
MCV: 103.8 fL — ABNORMAL HIGH (ref 80.0–100.0)
Monocytes Absolute: 0.4 10*3/uL (ref 0.1–1.0)
Monocytes Relative: 5 %
Neutro Abs: 5.6 10*3/uL (ref 1.7–7.7)
Neutrophils Relative %: 73 %
Platelets: 215 10*3/uL (ref 150–400)
RBC: 3.19 MIL/uL — ABNORMAL LOW (ref 3.87–5.11)
RDW: 17.3 % — ABNORMAL HIGH (ref 11.5–15.5)
WBC: 7.7 10*3/uL (ref 4.0–10.5)
nRBC: 0 % (ref 0.0–0.2)

## 2020-10-02 LAB — BASIC METABOLIC PANEL
Anion gap: 18 — ABNORMAL HIGH (ref 5–15)
BUN: 64 mg/dL — ABNORMAL HIGH (ref 8–23)
CO2: 21 mmol/L — ABNORMAL LOW (ref 22–32)
Calcium: 8.3 mg/dL — ABNORMAL LOW (ref 8.9–10.3)
Chloride: 100 mmol/L (ref 98–111)
Creatinine, Ser: 9.06 mg/dL — ABNORMAL HIGH (ref 0.44–1.00)
GFR, Estimated: 4 mL/min — ABNORMAL LOW (ref >60)
Glucose, Bld: 152 mg/dL — ABNORMAL HIGH (ref 70–99)
Potassium: 7.5 mmol/L (ref 3.5–5.1)
Sodium: 139 mmol/L (ref 135–145)

## 2020-10-02 LAB — PROTIME-INR
INR: 1 (ref 0.8–1.2)
Prothrombin Time: 12.6 seconds (ref 11.4–15.2)

## 2020-10-02 LAB — GLUCOSE, CAPILLARY: Glucose-Capillary: 141 mg/dL — ABNORMAL HIGH (ref 70–99)

## 2020-10-02 LAB — POTASSIUM: Potassium: 3.5 mmol/L (ref 3.5–5.1)

## 2020-10-02 MED ORDER — MIDAZOLAM HCL 2 MG/2ML IJ SOLN
INTRAMUSCULAR | Status: AC | PRN
  Administered 2020-10-02: 1 mg via INTRAVENOUS

## 2020-10-02 MED ORDER — LIDOCAINE-EPINEPHRINE 1 %-1:100000 IJ SOLN
INTRAMUSCULAR | Status: AC
  Filled 2020-10-02: qty 1

## 2020-10-02 MED ORDER — FENTANYL CITRATE (PF) 100 MCG/2ML IJ SOLN
INTRAMUSCULAR | Status: AC | PRN
Start: 2020-10-02 — End: 2020-10-02
  Administered 2020-10-02: 50 ug via INTRAVENOUS

## 2020-10-02 MED ORDER — HEPARIN SOD (PORK) LOCK FLUSH 100 UNIT/ML IV SOLN
INTRAVENOUS | Status: AC
  Filled 2020-10-02: qty 5

## 2020-10-02 MED ORDER — MIDAZOLAM HCL 2 MG/2ML IJ SOLN
INTRAMUSCULAR | Status: AC
  Filled 2020-10-02: qty 2

## 2020-10-02 MED ORDER — SODIUM CHLORIDE 0.9 % IV SOLN: INTRAVENOUS | Status: DC

## 2020-10-02 MED ORDER — CHLORHEXIDINE GLUCONATE CLOTH 2 % EX PADS: 6.0000 | MEDICATED_PAD | Freq: Every day | CUTANEOUS | Status: DC

## 2020-10-02 MED ORDER — FENTANYL CITRATE (PF) 100 MCG/2ML IJ SOLN
INTRAMUSCULAR | Status: AC
  Filled 2020-10-02: qty 2

## 2020-10-02 NOTE — Discharge Instructions (Signed)
As instructed go to [facility] directly after discharge from [facility]  Please call Interventional Radiology clinic [phone] with any questions or concerns.   Moderate Conscious Sedation, Adult, Care After This sheet gives you information about how to care for yourself after your procedure. Your health care provider may also give you more specific instructions. If you have problems or questions, contact your health care provider. What can I expect after the procedure? After the procedure, it is common to have:  Sleepiness for several hours.  Impaired judgment for several hours.  Difficulty with balance.  Vomiting if you eat too soon. Follow these instructions at home: For the time period you were told by your health care provider:  Rest.  Do not participate in activities where you could fall or become injured.  Do not drive or use machinery.  Do not drink alcohol.  Do not take sleeping pills or medicines that cause drowsiness.  Do not make important decisions or sign legal documents.  Do not take care of children on your own.      Eating and drinking  Follow the diet recommended by your health care provider.  Drink enough fluid to keep your urine pale yellow.  If you vomit: ? Drink water, juice, or soup when you can drink without vomiting. ? Make sure you have little or no nausea before eating solid foods.   General instructions  Take over-the-counter and prescription medicines only as told by your health care provider.  Have a responsible adult stay with you for the time you are told. It is important to have someone help care for you until you are awake and alert.  Do not smoke.  Keep all follow-up visits as told by your health care provider. This is important. Contact a health care provider if:  You are still sleepy or having trouble with balance after 24 hours.  You feel light-headed.  You keep feeling nauseous or you keep  vomiting.  You develop a rash.  You have a fever.  You have redness or swelling around the IV site. Get help right away if:  You have trouble breathing.  You have new-onset confusion at home. Summary  After the procedure, it is common to feel sleepy, have impaired judgment, or feel nauseous if you eat too soon.  Rest after you get home. Know the things you should not do after the procedure.  Follow the diet recommended by your health care provider and drink enough fluid to keep your urine pale yellow.  Get help right away if you have trouble breathing or new-onset confusion at home. This information is not intended to replace advice given to you by your health care provider. Make sure you discuss any questions you have with your health care provider. Document Revised: 11/05/2019 Document Reviewed: 06/03/2019 Elsevier Patient Education  2021 [employer].

## 2020-10-02 NOTE — Discharge Instructions (Signed)
Please proceed to [facility] emergency department following discharge from [facility] as instructed by Dr. Serafina Royals.    Moderate Conscious Sedation, Adult, Care After This sheet gives you information about how to care for yourself after your procedure. Your health care provider may also give you more specific instructions. If you have problems or questions, contact your health care provider. What can I expect after the procedure? After the procedure, it is common to have:  Sleepiness for several hours.  Impaired judgment for several hours.  Difficulty with balance.  Vomiting if you eat too soon. Follow these instructions at home: For the time period you were told by your health care provider:  Rest.  Do not participate in activities where you could fall or become injured.  Do not drive or use machinery.  Do not drink alcohol.  Do not take sleeping pills or medicines that cause drowsiness.  Do not make important decisions or sign legal documents.  Do not take care of children on your own.      Eating and drinking  Follow the diet recommended by your health care provider.  Drink enough fluid to keep your urine pale yellow.  If you vomit: ? Drink water, juice, or soup when you can drink without vomiting. ? Make sure you have little or no nausea before eating solid foods.   General instructions  Take over-the-counter and prescription medicines only as told by your health care provider.  Have a responsible adult stay with you for the time you are told. It is important to have someone help care for you until you are awake and alert.  Do not smoke.  Keep all follow-up visits as told by your health care provider. This is important. Contact a health care provider if:  You are still sleepy or having trouble with balance after 24 hours.  You feel light-headed.  You keep feeling nauseous or you keep vomiting.  You develop a rash.  You have a fever.  You  have redness or swelling around the IV site. Get help right away if:  You have trouble breathing.  You have new-onset confusion at home. Summary  After the procedure, it is common to feel sleepy, have impaired judgment, or feel nauseous if you eat too soon.  Rest after you get home. Know the things you should not do after the procedure.  Follow the diet recommended by your health care provider and drink enough fluid to keep your urine pale yellow.  Get help right away if you have trouble breathing or new-onset confusion at home. This information is not intended to replace advice given to you by your health care provider. Make sure you discuss any questions you have with your health care provider. Document Revised: 11/05/2019 Document Reviewed: 06/03/2019 Elsevier Patient Education  2021 [employer].

## 2020-10-02 NOTE — Discharge Instructions (Addendum)
Please make sure to attend all scheduled dialysis sessions.

## 2020-10-02 NOTE — Telephone Encounter (Signed)
[name] with [facility] is calling to advise that pt is not sure if she is taken the below   Not taken Midoine 5 mg --not sure if she is taking it tresiba--she told them 30 units, but she is not taken it  Statistician with [place] ext [phone]

## 2020-10-02 NOTE — ED Triage Notes (Addendum)
Patient here from [facility]. Sent over for dialysis. Patient was supposed to have port placed for chemo treatment today. Potassium 7.5 @  today.

## 2020-10-02 NOTE — ED Notes (Signed)
Patient discharged by ED provider at this time. Wheeled to exit in NAD.

## 2020-10-02 NOTE — ED Notes (Signed)
Report called to HD, pt transported on CCM by TSS

## 2020-10-02 NOTE — H&P (Signed)
Chief Complaint: Patient was seen in consultation today for port-a-catheter placement.   Referring Physician(s): Orson Slick  Supervising Physician: Ruthann Cancer  Patient Status: [facility] - Out-pt  History of Present Illness: [name] is a 70 y.o. female with a medical history significant for asthma, anemia, renal cell carcinoma (s/p right radical nephrectomy), DM2 with retinopathy/neuropathy, PVD, ESRD on HD and HTN. She presented to the ED in early February with complaints of abdominal pain, constipation, nausea and vomiting. CT imaging showed small and large bowel obstruction secondary to a colonic mass in the ascending colon; lymphadenopathy concerning for metastatic disease also identified. She was taken to the OR 08/30/20 for a right hemicolectomy and ileostomy creation. Colonic biopsy was obtained with pathology positive for adenocarcinoma. Her Oncology team is preparing her for chemotherapy.    Interventional Radiology has been asked to evaluate this patient for an image-guided port-a-catheter placement to facilitate her treatment plans.   Past Medical History:  Diagnosis Date  . Anemia   . Anemia associated with chronic renal failure   . Anemia in chronic kidney disease 09/29/2018  . Arthritis   . Asthma   . Blood transfusion without reported diagnosis    Phreesia 02/27/2020  . Cancer ([place])    Phreesia 02/27/2020  . Cataract    OD  . Chronic kidney disease    Phreesia 02/27/2020  . Coronary artery calcification seen on CAT scan 02/17/2018   Coronary calcification on CT  . Depression   . Diabetes mellitus without complication ([place])    Phreesia 02/27/2020  . Diabetic retinopathy of both eyes ([place])   . Diabetic ulcer of right foot associated with type 2 diabetes mellitus ([place])   . ESRD (end stage renal disease) on dialysis ([place])   . Essential hypertension 02/17/2018   Essential hypertension  . GERD (gastroesophageal reflux disease)    pt denies  . HLD  (hyperlipidemia)   . Hypertension   . Hypertensive retinopathy    OU  . Left ventricular dysfunction 04/07/2018   Left ventricle dysfunction  . Microalbuminuria due to type 2 diabetes mellitus ([place])   . Neuromuscular disorder ([place])    diabetic neuropathy  . Nonproliferative retinopathy due to secondary diabetes ([place])   . Nonsustained ventricular tachycardia ([place]) 08/28/2018   Nonsustained ventricular tachycardia  . Pneumonia    hx of   . Renal cell cancer ([place])   . Renal insufficiency   . Right renal mass 03/10/2017  . Type 2 diabetes, controlled, with neuropathy ([place]) 06/22/2013  . Ulcer of other part of foot 06/22/2013  . 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 [facility])     Past Surgical History:  Procedure Laterality Date  . AMPUTATION TOE Right 10/29/2019   Procedure: RIGHT 2ND TOE AMPUTATION;  Surgeon: Edrick Kins, DPM;  Location: [facility];  Service: Podiatry;  Laterality: Right;  . AMPUTATION TOE Bilateral 09/04/2020   Procedure: RIGHT THIRD DIGIT AMPUTATION;  Surgeon: Edrick Kins, DPM;  Location: [place];  Service: Podiatry;  Laterality: Bilateral;  . BASCILIC VEIN TRANSPOSITION Left 08/08/2017   Procedure: BASILIC VEIN TRANSPOSITION FIRST STAGE LEFT ARM;  Surgeon: Serafina Mitchell, MD;  Location: [place];  Service: Vascular;  Laterality: Left;  . BASCILIC VEIN TRANSPOSITION Left 05/14/2018   Procedure: SECOND STAGE BASILIC VEIN TRANSPOSITION LEFT ARM;  Surgeon: Serafina Mitchell, MD;  Location: [place];  Service: Vascular;  Laterality: Left;  . BIOPSY  08/29/2020   Procedure: BIOPSY;  Surgeon: [place] Farm Cellar  P, MD;  Location: [place];  Service: Gastroenterology;;  . CATARACT EXTRACTION Left   . COLONOSCOPY WITH PROPOFOL N/A 08/29/2020   Procedure: COLONOSCOPY WITH PROPOFOL;  Surgeon: Yetta Flock, MD;  Location: [place];  Service: Gastroenterology;  Laterality: N/A;  . EYE SURGERY    . ILEOSTOMY Right 08/30/2020   Procedure: ILEOSTOMY;  Surgeon: Jesusita Oka, MD;  Location: [place];  Service: General;  Laterality: Right;  . PARTIAL COLECTOMY N/A 08/30/2020   Procedure: HEMI COLECTOMY;  Surgeon: Jesusita Oka, MD;  Location: [place];  Service: General;  Laterality: N/A;  . POLYPECTOMY  08/29/2020   Procedure: POLYPECTOMY;  Surgeon: Yetta Flock, MD;  Location: [facility] ENDOSCOPY;  Service: Gastroenterology;;  . ROBOTIC ADRENALECTOMY Left 03/10/2017   Procedure: XI ROBOTIC ADRENALECTOMY;  Surgeon: Nickie Retort, MD;  Location: [facility];  Service: Urology;  Laterality: Left;  . ROBOTIC ASSITED PARTIAL NEPHRECTOMY Right 03/10/2017   Procedure: XI ROBOTIC ASSITED RADICAL NEPHRECTOMY;  Surgeon: Nickie Retort, MD;  Location: [facility];  Service: Urology;  Laterality: Right;  . SUBMUCOSAL TATTOO INJECTION  08/29/2020   Procedure: SUBMUCOSAL TATTOO INJECTION;  Surgeon: Yetta Flock, MD;  Location: [facility] ENDOSCOPY;  Service: Gastroenterology;;  . TOE AMPUTATION Bilateral    both great toe ,, left foot 2nd toe 1/2  . TOE ARTHROPLASTY  09/04/2020   Procedure: ARTHROPLASTY OF THE THIRD DIGIT LEFT FOOT;  Surgeon: Edrick Kins, DPM;  Location: [facility];  Service: Podiatry;;    Allergies: Latex, Penicillins, and Adhesive [tape]  Medications: Prior to Admission medications   Medication Sig Start Date End Date Taking? Authorizing Provider  Blood Glucose Monitoring Suppl (ONE TOUCH ULTRA 2) w/Device KIT Use to check BS BID-QID Dx:E11.9 05/19/17   [name], MD  BREO ELLIPTA 100-25 MCG/INH AEPB Inhale 1 puff by mouth once daily Patient taking differently: Inhale 1 puff into the lungs daily. 08/09/20   [name], MD  Calcium Carbonate Antacid (TUMS PO) Take 1 tablet by mouth 3 (three) times daily with meals.    [provider]  Carboxymethylcellulose Sodium (THERATEARS OP) Place 1 drop into both eyes daily as needed (dry eyes).    [provider]  diphenhydrAMINE (BENADRYL) 25 MG tablet Take 25 mg by mouth daily as  needed for allergies or itching. Patient not taking: Reported on 09/22/2020    [provider]  doxercalciferol (HECTOROL) 4 MCG/2ML injection Inject 1.5 mLs (3 mcg total) into the vein Every Tuesday,Thursday,and Saturday with dialysis. 09/07/20   Oswald Hillock, MD  gabapentin (NEURONTIN) 300 MG capsule TAKE 1 CAPSULE BY MOUTH THREE TIMES DAILY. REQUIRES OFFICE VISIT BEFORE ANY FURTHER REFILLS CAN BE GIVEN Patient taking differently: Take 300 mg by mouth 3 (three) times daily as needed (nerve pain). 06/21/20   [name], MD  insulin aspart (NOVOLOG) 100 UNIT/ML injection Inject 0-6 Units into the skin 3 (three) times daily with meals. Sliding scale insulin Less than 70 initiate hypoglycemia protocol 70-120  0 units 120-150 0 unit 151-200 1 units 201-250 2 units 251-300 3 units 301-350 4 units 351-400 5 units Greater than 400 call MD and give 6 units 09/06/20   [place], Marge Duncans, MD  insulin degludec (TRESIBA FLEXTOUCH) 100 UNIT/ML FlexTouch Pen Inject 20 Units into the skin daily. Hold if fasting blood sugars are <130. 09/06/20   Oswald Hillock, MD  Insulin Pen Needle (LIVE BETTER PEN NEEDLES) 31G X 6 MM MISC To use with [place]  pens daily 02/05/19   [name], MD  Lancets [facility] ULTRASOFT) lancets Use as instructed 05/19/17   [name], MD  midodrine (PROAMATINE) 5 MG tablet Take 1 tablet (5 mg total) by mouth Every Tuesday,Thursday,and Saturday with dialysis. 09/07/20   Oswald Hillock, MD  multivitamin (RENA-VIT) TABS tablet Take 1 tablet by mouth daily.    [provider]  [facility] ULTRA test strip USE AS DIRECTED TO MONITOR  FSBS 3 TIMES DAILY 01/27/20   Ishmael Holter A, FNP  oxyCODONE (ROXICODONE) 5 MG immediate release tablet Take 1 tablet (5 mg total) by mouth every 8 (eight) hours as needed for up to 10 doses. Patient not taking: Reported on 09/22/2020 09/06/20   Oswald Hillock, MD  PROAIR HFA 108 [phone] Base) MCG/ACT inhaler INHALE 2 PUFFS BY MOUTH EVERY 6 HOURS AS  NEEDED FOR WHEEZING FOR SHORTNESS OF BREATH Patient taking differently: Inhale 2 puffs into the lungs every 6 (six) hours as needed for wheezing or shortness of breath. 08/09/20   [name], MD  rosuvastatin (CRESTOR) 20 MG tablet Take 1 tablet (20 mg total) by mouth daily. 11/23/19   [name], MD  SANTYL ointment Apply 1 application topically daily as needed (wound care). 03/29/20   [provider]  traZODone (DESYREL) 50 MG tablet Take 0.5 tablets (25 mg total) by mouth at bedtime as needed for sleep. Patient not taking: Reported on 09/22/2020 09/06/20   Oswald Hillock, MD     Family History  Problem Relation Age of Onset  . Heart disease Mother   . Diabetes Sister     Social History   Socioeconomic History  . Marital status: Single    Spouse name: Not on file  . Number of children: 2  . Years of education: 82  . Highest education level: Not on file  Occupational History  . Not on file  Tobacco Use  . Smoking status: Never Smoker  . Smokeless tobacco: Never Used  Vaping Use  . Vaping Use: Never used  Substance and Sexual Activity  . Alcohol use: Yes    Alcohol/week: 0.0 standard drinks    Comment: occ  . Drug use: No  . Sexual activity: Not Currently    Partners: Male  Other Topics Concern  . Not on file  Social History Narrative  . Not on file   Social Determinants of Health   Financial Resource Strain: Low Risk   . Difficulty of Paying Living Expenses: Not very hard  Food Insecurity: Not on file  Transportation Needs: Not on file  Physical Activity: Not on file  Stress: Not on file  Social Connections: Not on file    Review of Systems: A 12 point ROS discussed and pertinent positives are indicated in the HPI above.  All other systems are negative.  Review of Systems  Constitutional: Negative for appetite change and fatigue.  Respiratory: Negative for cough and shortness of breath.   Cardiovascular: Positive for leg swelling. Negative for  chest pain.  Gastrointestinal: Positive for diarrhea. Negative for abdominal distention, nausea and vomiting.  Musculoskeletal: Negative for back pain.  Neurological: Negative for headaches.    Vital Signs: BP 131/70   Pulse 82   Temp 98.3 F (36.8 C) (Oral)   Resp 20   Ht 5' 7" (1.702 m)   Wt 199 lb (90.3 kg)   SpO2 100%   BMI 31.17 kg/m   Physical Exam Constitutional:      General:  She is not in acute distress. HENT:     Mouth/Throat:     Mouth: Mucous membranes are moist.     Pharynx: Oropharynx is clear.  Cardiovascular:     Rate and Rhythm: Normal rate and regular rhythm.     Pulses: Normal pulses.     Comments: Left upper arm fistula Pulmonary:     Effort: Pulmonary effort is normal.  Abdominal:     Palpations: Abdomen is soft.     Comments: RLQ colostomy  Musculoskeletal:     Right lower leg: Edema present.     Left lower leg: Edema present.  Skin:    General: Skin is warm and dry.  Neurological:     Mental Status: She is alert.     Imaging: No results found.  Labs:  CBC: Recent Labs    09/05/20 0319 09/06/20 0123 09/07/20 0737 09/22/20 1535  WBC 5.9 6.4 6.8 4.7  HGB 7.2* 7.7* 7.6* 9.1*  HCT 22.2* 22.5* 23.3* 28.4*  PLT 114* 121* 148* 175    COAGS: No results for input(s): INR, APTT in the last 8760 hours.  BMP: Recent Labs    10/29/19 1530 11/03/19 1131 11/08/19 0852 02/07/20 1159 05/19/20 1254 09/04/20 0825 09/06/20 0123 09/07/20 0733 09/22/20 1535  NA 138   < > 140 143   < > 132* 133* 132* 138  K 3.9   < > 3.8 5.2   < > 3.7 4.3 4.4 5.1  CL 97*   < > 98 99   < > 94* 97* 96* 95*  CO2 26   < > 26 32   < > 23 22 21* 31  GLUCOSE 140*   < > 192* 102*   < > 136* 146* 115* 154*  BUN 18   < > 46* 27*   < > 35* 28* 41* 20  CALCIUM 8.5*   < > 8.2* 8.7   < > 7.6* 7.5* 8.0* 9.1  CREATININE 4.66*   < > 7.38* 6.19*   < > 6.94* 5.16* 7.14* 5.07*  GFRNONAA 9*  --  5* 6*   < > 6* 9* 6* 9*  GFRAA 10*  --  6* 7*  --   --   --   --   --    <  > = values in this interval not displayed.    LIVER FUNCTION TESTS: Recent Labs    05/19/20 1254 08/27/20 1350 08/28/20 0424 08/29/20 0303 08/30/20 0252 09/07/20 0733 09/22/20 1535  BILITOT 1.1 1.1 0.9  --   --   --  0.4  AST _0 --   --   --  14*  ALT _1 --   --   --  11  ALKPHOS 53 72 59  --   --   --  65  PROT 7.8 7.4 6.3*  --   --   --  6.8  ALBUMIN 3.6 3.5 2.9* 2.5* 2.3* 1.9* 3.0*    TUMOR MARKERS: No results for input(s): AFPTM, CEA, CA199, CHROMGRNA in the last 8760 hours.  Assessment and Plan:  Metastatic colon cancer: [place] Brooking. Ronnald Ramp, 70 year old female, presents today to the [facility] department for an image-guided port-a-catheter placement.   Risks and benefits of image-guided port-a-catheter placement were discussed with the patient including, but not limited to bleeding, infection, pneumothorax, or fibrin sheath development and need for additional procedures.  All of the patient's questions were answered, patient is agreeable  to proceed. She has been NPO.   Consent signed and in chart.  Thank you for this interesting consult.  I greatly enjoyed meeting VERLINE KONG and look forward to participating in their care.  A copy of this report was sent to the requesting provider on this date.  Electronically Signed: Soyla Dryer, AGACNP-BC [phone] 10/02/2020, 1:58 PM   I spent a total of  30 Minutes   in face to face in clinical consultation, greater than 50% of which was counseling/coordinating care for port-a-catheter placement.

## 2020-10-02 NOTE — ED Provider Notes (Signed)
[facility] EMERGENCY DEPARTMENT Provider Note   CSN: [id number] Arrival date & time: 10/02/20  1617     History Chief Complaint  Patient presents with  . dialysis    [name] is a 70 y.o. female.  The patient was sent from [facility].  She was there in the IR suite awaiting Port-A-Cath placement for chemotherapy.  She has adenocarcinoma of the colon.  Labs returned with a potassium of greater than 7, and the patient was sent here for dialysis.  The procedure was aborted.  The patient denies any symptoms and feels that she is at baseline.  The history is provided by the patient.  Illness Location:  Sent for dialysis Quality:  N/a Severity:  Severe Onset quality:  Gradual Duration:  3 days Timing:  Constant Chronicity:  New Context:  The patient has end-stage renal disease and dialyzes Tuesday, Thursday, and Saturday.  She missed her dialysis on Saturday because it was raining, and she states that she would have been soaked trying to get to the car. Relieved by:  Nothing Worsened by:  Nothing Associated symptoms: no abdominal pain, no chest pain, no congestion, no cough, no diarrhea, no fatigue, no fever, no headaches, no loss of consciousness, no myalgias, no rash, no rhinorrhea, no shortness of breath, no sore throat and no vomiting        Past Medical History:  Diagnosis Date  . Anemia   . Anemia associated with chronic renal failure   . Anemia in chronic kidney disease 09/29/2018  . Arthritis   . Asthma   . Blood transfusion without reported diagnosis    Phreesia 02/27/2020  . Cancer ([place])    Phreesia 02/27/2020  . Cataract    OD  . Chronic kidney disease    Phreesia 02/27/2020  . Coronary artery calcification seen on CAT scan 02/17/2018   Coronary calcification on CT  . Depression   . Diabetes mellitus without complication ([place])    Phreesia 02/27/2020  . Diabetic retinopathy of both eyes ([place])   . Diabetic ulcer of right foot  associated with type 2 diabetes mellitus ([place])   . ESRD (end stage renal disease) on dialysis ([place])   . Essential hypertension 02/17/2018   Essential hypertension  . GERD (gastroesophageal reflux disease)    pt denies  . HLD (hyperlipidemia)   . Hypertension   . Hypertensive retinopathy    OU  . Left ventricular dysfunction 04/07/2018   Left ventricle dysfunction  . Microalbuminuria due to type 2 diabetes mellitus ([place])   . Neuromuscular disorder ([place])    diabetic neuropathy  . Nonproliferative retinopathy due to secondary diabetes ([place])   . Nonsustained ventricular tachycardia ([place]) 08/28/2018   Nonsustained ventricular tachycardia  . Pneumonia    hx of   . Renal cell cancer ([place])   . Renal insufficiency   . Right renal mass 03/10/2017  . Type 2 diabetes, controlled, with neuropathy ([place]) 06/22/2013  . Ulcer of other part of foot 06/22/2013  . 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 [facility])     Patient Active Problem List   Diagnosis Date Noted  . Metastatic colon cancer in female [facility]) 09/24/2020  . Malignant neoplasm of ascending colon ([place])   . Benign neoplasm of colon   . Cellulitis in diabetic foot ([place]) 08/28/2020  . Chronic ulcer of toe of right foot ([place])   . Colonic mass   . ESRD on hemodialysis ([place])   . Bowel obstruction (  [place]) 08/27/2020  . Dysuria 05/16/2020  . Allergy, unspecified, initial encounter 03/30/2020  . Anaphylactic shock, unspecified, initial encounter 03/30/2020  . Unspecified protein-calorie malnutrition ([place]) 07/02/2019  . Underimmunization status 06/08/2019  . Body mass index (BMI) 40.0-44.9, adult ([place]) 05/26/2019  . Coagulation defect, unspecified ([place]) 05/26/2019  . Dependence on renal dialysis ([place]) 05/26/2019  . Diarrhea, unspecified 05/26/2019  . End stage renal disease ([place]) 05/26/2019  . Heart failure, unspecified ([place]) 05/26/2019  . Hypertensive chronic kidney disease with stage 1 through stage 4 chronic kidney disease, or  unspecified chronic kidney disease 05/26/2019  . Iron deficiency anemia, unspecified 05/26/2019  . Malignant neoplasm of unspecified kidney, except renal pelvis ([place]) 05/26/2019  . Pain, unspecified 05/26/2019  . Peripheral vascular disease ([place]) 05/26/2019  . Polyneuropathy, unspecified 05/26/2019  . Pruritus, unspecified 05/26/2019  . Secondary hyperparathyroidism of renal origin ([place]) 05/26/2019  . Shortness of breath 05/26/2019  . Unspecified asthma, uncomplicated 58/52/7782  . Disorder of kidney and ureter, unspecified 05/26/2019  . Anemia in chronic kidney disease 09/29/2018  . Nonsustained ventricular tachycardia ([place]) 08/28/2018  . Nonproliferative retinopathy due to secondary diabetes ([place])   . Left ventricular dysfunction 04/07/2018  . Essential hypertension 02/17/2018  . Coronary artery calcification seen on CAT scan 02/17/2018  . Diabetic retinopathy of both eyes ([place])   . Hyperlipidemia 10/01/2017  . Encounter for long-term (current) use of medications 10/01/2017  . Right renal mass 03/10/2017  . Diabetic ulcer of right foot associated with type 2 diabetes mellitus ([place])   . Type 2 diabetes, controlled, with neuropathy ([place]) 06/22/2013  . Ulcer of other part of foot 06/22/2013    Past Surgical History:  Procedure Laterality Date  . AMPUTATION TOE Right 10/29/2019   Procedure: RIGHT 2ND TOE AMPUTATION;  Surgeon: Edrick Kins, DPM;  Location: [facility];  Service: Podiatry;  Laterality: Right;  . AMPUTATION TOE Bilateral 09/04/2020   Procedure: RIGHT THIRD DIGIT AMPUTATION;  Surgeon: Edrick Kins, DPM;  Location: [place];  Service: Podiatry;  Laterality: Bilateral;  . BASCILIC VEIN TRANSPOSITION Left 08/08/2017   Procedure: BASILIC VEIN TRANSPOSITION FIRST STAGE LEFT ARM;  Surgeon: Serafina Mitchell, MD;  Location: [place];  Service: Vascular;  Laterality: Left;  . BASCILIC VEIN TRANSPOSITION Left 05/14/2018   Procedure: SECOND STAGE BASILIC VEIN TRANSPOSITION LEFT ARM;  Surgeon:  Serafina Mitchell, MD;  Location: [place];  Service: Vascular;  Laterality: Left;  . BIOPSY  08/29/2020   Procedure: BIOPSY;  Surgeon: Yetta Flock, MD;  Location: [place];  Service: Gastroenterology;;  . CATARACT EXTRACTION Left   . COLONOSCOPY WITH PROPOFOL N/A 08/29/2020   Procedure: COLONOSCOPY WITH PROPOFOL;  Surgeon: Yetta Flock, MD;  Location: [place];  Service: Gastroenterology;  Laterality: N/A;  . EYE SURGERY    . ILEOSTOMY Right 08/30/2020   Procedure: ILEOSTOMY;  Surgeon: Jesusita Oka, MD;  Location: [place];  Service: General;  Laterality: Right;  . PARTIAL COLECTOMY N/A 08/30/2020   Procedure: HEMI COLECTOMY;  Surgeon: Jesusita Oka, MD;  Location: [place];  Service: General;  Laterality: N/A;  . POLYPECTOMY  08/29/2020   Procedure: POLYPECTOMY;  Surgeon: Yetta Flock, MD;  Location: [place] [facility];  Service: Gastroenterology;;  . ROBOTIC ADRENALECTOMY Left 03/10/2017   Procedure: XI ROBOTIC ADRENALECTOMY;  Surgeon: Nickie Retort, MD;  [facility];  Service: Urology;  Laterality: Left;  . ROBOTIC ASSITED PARTIAL NEPHRECTOMY Right 03/10/2017   Procedure: XI ROBOTIC ASSITED RADICAL NEPHRECTOMY;  Surgeon: [name]  Jeneen Rinks, MD;  [facility];  Service: Urology;  Laterality: Right;  . SUBMUCOSAL TATTOO INJECTION  08/29/2020   Procedure: SUBMUCOSAL TATTOO INJECTION;  Surgeon: Yetta Flock, MD;  Location: [facility];  Service: Gastroenterology;;  . TOE AMPUTATION Bilateral    both great toe ,, left foot 2nd toe 1/2  . TOE ARTHROPLASTY  09/04/2020   Procedure: ARTHROPLASTY OF THE THIRD DIGIT LEFT FOOT;  Surgeon: Edrick Kins, DPM;  Location: [place] [facility];  Service: Podiatry;;     OB History   No obstetric history on file.     Family History  Problem Relation Age of Onset  . Heart disease Mother   . Diabetes Sister     Social History   Tobacco Use  . Smoking status: Never Smoker  . Smokeless tobacco: Never Used  Vaping Use  .  Vaping Use: Never used  Substance Use Topics  . Alcohol use: Yes    Alcohol/week: 0.0 standard drinks    Comment: occ  . Drug use: No    Home Medications Prior to Admission medications   Medication Sig Start Date End Date Taking? Authorizing Provider  Blood Glucose Monitoring Suppl (ONE TOUCH ULTRA 2) w/Device KIT Use to check BS BID-QID Dx:E11.9 05/19/17   [name], MD  BREO ELLIPTA 100-25 MCG/INH AEPB Inhale 1 puff by mouth once daily Patient taking differently: Inhale 1 puff into the lungs daily. 08/09/20   [name], MD  Calcium Carbonate Antacid (TUMS PO) Take 1 tablet by mouth 3 (three) times daily with meals.    [provider]  Carboxymethylcellulose Sodium (THERATEARS OP) Place 1 drop into both eyes daily as needed (dry eyes).    [provider]  diphenhydrAMINE (BENADRYL) 25 MG tablet Take 25 mg by mouth daily as needed for allergies or itching. Patient not taking: No sig reported    [provider]  doxercalciferol (HECTOROL) 4 MCG/2ML injection Inject 1.5 mLs (3 mcg total) into the vein Every Tuesday,Thursday,and Saturday with dialysis. 09/07/20   Oswald Hillock, MD  gabapentin (NEURONTIN) 300 MG capsule TAKE 1 CAPSULE BY MOUTH THREE TIMES DAILY. REQUIRES OFFICE VISIT BEFORE ANY FURTHER REFILLS CAN BE GIVEN Patient taking differently: Take 300 mg by mouth 3 (three) times daily as needed (nerve pain). 06/21/20   [name], MD  insulin aspart (NOVOLOG) 100 UNIT/ML injection Inject 0-6 Units into the skin 3 (three) times daily with meals. Sliding scale insulin Less than 70 initiate hypoglycemia protocol 70-120  0 units 120-150 0 unit 151-200 1 units 201-250 2 units 251-300 3 units 301-350 4 units 351-400 5 units Greater than 400 call MD and give 6 units 09/06/20   Iraq, Marge Duncans, MD  insulin degludec (TRESIBA FLEXTOUCH) 100 UNIT/ML FlexTouch Pen Inject 20 Units into the skin daily. Hold if fasting blood sugars are <130. 09/06/20   Oswald Hillock, MD  Insulin Pen Needle (LIVE BETTER PEN NEEDLES) 31G X 6 MM MISC To use with Tresiba pens daily 02/05/19   [name], MD  Lancets [facility] ULTRASOFT) lancets Use as instructed 05/19/17   [name], MD  midodrine (PROAMATINE) 5 MG tablet Take 1 tablet (5 mg total) by mouth Every Tuesday,Thursday,and Saturday with dialysis. 09/07/20   Oswald Hillock, MD  multivitamin (RENA-VIT) TABS tablet Take 1 tablet by mouth daily.    [provider]  [facility] ULTRA test strip USE AS DIRECTED TO MONITOR  FSBS 3 TIMES DAILY 01/27/20   Redmond Baseman,  Crystal A, FNP  oxyCODONE (ROXICODONE) 5 MG immediate release tablet Take 1 tablet (5 mg total) by mouth every 8 (eight) hours as needed for up to 10 doses. Patient not taking: No sig reported 09/06/20   Oswald Hillock, MD  PROAIR HFA [phone] Base) MCG/ACT inhaler INHALE 2 PUFFS BY MOUTH EVERY 6 HOURS AS NEEDED FOR WHEEZING FOR SHORTNESS OF BREATH Patient taking differently: Inhale 2 puffs into the lungs every 6 (six) hours as needed for wheezing or shortness of breath. 08/09/20   [name], MD  rosuvastatin (CRESTOR) 20 MG tablet Take 1 tablet (20 mg total) by mouth daily. 11/23/19   [name], MD  SANTYL ointment Apply 1 application topically daily as needed (wound care). 03/29/20   [provider]  traZODone (DESYREL) 50 MG tablet Take 0.5 tablets (25 mg total) by mouth at bedtime as needed for sleep. Patient not taking: No sig reported 09/06/20   Oswald Hillock, MD    Allergies    Latex, Penicillins, and Adhesive [tape]  Review of Systems   Review of Systems  Constitutional: Negative for fatigue and fever.  HENT: Negative for congestion, rhinorrhea and sore throat.   Respiratory: Negative for cough and shortness of breath.   Cardiovascular: Negative for chest pain.  Gastrointestinal: Negative for abdominal pain, diarrhea and vomiting.  Musculoskeletal: Negative for myalgias.  Skin: Negative for rash.  Neurological:  Negative for loss of consciousness and headaches.    Physical Exam Updated Vital Signs BP 103/74 (BP Location: Right Arm)   Pulse 91   Temp 98.1 F (36.7 C) (Oral)   Resp 18   SpO2 100%   Physical Exam Vitals and nursing note reviewed.  Constitutional:      General: She is not in acute distress.    Appearance: She is well-developed.  HENT:     Head: Normocephalic and atraumatic.  Eyes:     Conjunctiva/sclera: Conjunctivae normal.  Cardiovascular:     Rate and Rhythm: Normal rate and regular rhythm.     Heart sounds: No murmur heard.   Pulmonary:     Effort: Pulmonary effort is normal. No respiratory distress.     Breath sounds: Normal breath sounds.  Abdominal:     Palpations: Abdomen is soft.     Tenderness: There is no abdominal tenderness.  Musculoskeletal:     Cervical back: Neck supple.  Skin:    General: Skin is warm and dry.     Comments: Patient has some excoriated lesions on the dorsum of the hand.  She states that she picks at them when she gets nervous.  No evidence of obvious cellulitis.  Neurological:     General: No focal deficit present.     Mental Status: She is alert.  Psychiatric:        Mood and Affect: Mood normal.        Behavior: Behavior normal.     ED Results / Procedures / Treatments   Labs (all labs ordered are listed, but only abnormal results are displayed) Labs Reviewed  POTASSIUM    EKG None  Radiology No results found.  Procedures Procedures   Medications Ordered in ED Medications  Chlorhexidine Gluconate Cloth 2 % PADS 6 each (has no administration in time range)    ED Course  I have reviewed the triage vital signs and the nursing notes.  Pertinent labs & imaging results that were available during my care of the patient were reviewed by me and  considered in my medical decision making (see chart for details).    MDM Rules/Calculators/A&P                          The patient missed a dialysis session and was  found to be quite hyperkalemic today.  She was transferred here for emergent dialysis.  No other complaints, and the patient was taken to the dialysis unit. Final Clinical Impression(s) / ED Diagnoses Final diagnoses:  End stage renal disease [facility])    Rx / DC Orders ED Discharge Orders    None       Arnaldo Natal, MD 10/02/20 2303

## 2020-10-02 NOTE — Progress Notes (Signed)
Prior to starting procedure, BMP resulted with hyperkalemia (7.5).  The patient states she skipped her Saturday HD session due to transportation issues.  He is asymptomatic.  Unsafe to proceed with port placement given degree of hyperkalemia.  Spoke with Dr. Justin Mend (Nephrology) who recommended transfer to ED for HD.  IR will reschedule for port placement ASAP.   Ruthann Cancer, MD Pager: [phone]

## 2020-10-02 NOTE — Sedation Documentation (Signed)
Procedure cancelled due to elevated serum Potassium level per Dr Serafina Royals.

## 2020-10-03 ENCOUNTER — Other Ambulatory Visit: Payer: Self-pay | Admitting: Student

## 2020-10-03 DIAGNOSIS — Z992 Dependence on renal dialysis: Secondary | ICD-10-CM | POA: Diagnosis not present

## 2020-10-03 DIAGNOSIS — N186 End stage renal disease: Secondary | ICD-10-CM | POA: Diagnosis not present

## 2020-10-03 DIAGNOSIS — D509 Iron deficiency anemia, unspecified: Secondary | ICD-10-CM | POA: Diagnosis not present

## 2020-10-03 DIAGNOSIS — N2581 Secondary hyperparathyroidism of renal origin: Secondary | ICD-10-CM | POA: Diagnosis not present

## 2020-10-03 DIAGNOSIS — D631 Anemia in chronic kidney disease: Secondary | ICD-10-CM | POA: Diagnosis not present

## 2020-10-03 DIAGNOSIS — D689 Coagulation defect, unspecified: Secondary | ICD-10-CM | POA: Diagnosis not present

## 2020-10-04 ENCOUNTER — Ambulatory Visit (HOSPITAL_COMMUNITY)
Admission: RE | Admit: 2020-10-04 | Discharge: 2020-10-04 | Disposition: A | Discharge status: Home / Self Care | Payer: Medicare Other | Source: Ambulatory Visit | Attending: Hematology and Oncology | Admitting: Hematology and Oncology

## 2020-10-04 ENCOUNTER — Encounter (HOSPITAL_COMMUNITY): Payer: Self-pay

## 2020-10-04 ENCOUNTER — Other Ambulatory Visit: Payer: Self-pay

## 2020-10-04 ENCOUNTER — Telehealth: Payer: Self-pay | Admitting: *Deleted

## 2020-10-04 DIAGNOSIS — Z992 Dependence on renal dialysis: Secondary | ICD-10-CM | POA: Insufficient documentation

## 2020-10-04 DIAGNOSIS — C189 Malignant neoplasm of colon, unspecified: Secondary | ICD-10-CM | POA: Insufficient documentation

## 2020-10-04 DIAGNOSIS — Z794 Long term (current) use of insulin: Secondary | ICD-10-CM | POA: Diagnosis not present

## 2020-10-04 DIAGNOSIS — J45909 Unspecified asthma, uncomplicated: Secondary | ICD-10-CM | POA: Insufficient documentation

## 2020-10-04 DIAGNOSIS — Z89421 Acquired absence of other right toe(s): Secondary | ICD-10-CM | POA: Diagnosis not present

## 2020-10-04 DIAGNOSIS — E1151 Type 2 diabetes mellitus with diabetic peripheral angiopathy without gangrene: Secondary | ICD-10-CM | POA: Diagnosis not present

## 2020-10-04 DIAGNOSIS — E1122 Type 2 diabetes mellitus with diabetic chronic kidney disease: Secondary | ICD-10-CM | POA: Insufficient documentation

## 2020-10-04 DIAGNOSIS — Z905 Acquired absence of kidney: Secondary | ICD-10-CM | POA: Insufficient documentation

## 2020-10-04 DIAGNOSIS — Z79899 Other long term (current) drug therapy: Secondary | ICD-10-CM | POA: Diagnosis not present

## 2020-10-04 DIAGNOSIS — N186 End stage renal disease: Secondary | ICD-10-CM | POA: Insufficient documentation

## 2020-10-04 DIAGNOSIS — Z7951 Long term (current) use of inhaled steroids: Secondary | ICD-10-CM | POA: Diagnosis not present

## 2020-10-04 DIAGNOSIS — Z85528 Personal history of other malignant neoplasm of kidney: Secondary | ICD-10-CM | POA: Insufficient documentation

## 2020-10-04 DIAGNOSIS — I120 Hypertensive chronic kidney disease with stage 5 chronic kidney disease or end stage renal disease: Secondary | ICD-10-CM | POA: Insufficient documentation

## 2020-10-04 DIAGNOSIS — Z452 Encounter for adjustment and management of vascular access device: Secondary | ICD-10-CM | POA: Diagnosis not present

## 2020-10-04 DIAGNOSIS — Z9049 Acquired absence of other specified parts of digestive tract: Secondary | ICD-10-CM | POA: Insufficient documentation

## 2020-10-04 HISTORY — PX: IR IMAGING GUIDED PORT INSERTION: IMG5740

## 2020-10-04 LAB — BASIC METABOLIC PANEL
Anion gap: 14 (ref 5–15)
BUN: 22 mg/dL (ref 8–23)
CO2: 30 mmol/L (ref 22–32)
Calcium: 8.7 mg/dL — ABNORMAL LOW (ref 8.9–10.3)
Chloride: 96 mmol/L — ABNORMAL LOW (ref 98–111)
Creatinine, Ser: 4.67 mg/dL — ABNORMAL HIGH (ref 0.44–1.00)
GFR, Estimated: 10 mL/min — ABNORMAL LOW (ref >60)
Glucose, Bld: 151 mg/dL — ABNORMAL HIGH (ref 70–99)
Potassium: 3.9 mmol/L (ref 3.5–5.1)
Sodium: 140 mmol/L (ref 135–145)

## 2020-10-04 LAB — CBC
HCT: 30.7 % — ABNORMAL LOW (ref 36.0–46.0)
Hemoglobin: 9.7 g/dL — ABNORMAL LOW (ref 12.0–15.0)
MCH: 32.8 pg (ref 26.0–34.0)
MCHC: 31.6 g/dL (ref 30.0–36.0)
MCV: 103.7 fL — ABNORMAL HIGH (ref 80.0–100.0)
Platelets: 160 10*3/uL (ref 150–400)
RBC: 2.96 MIL/uL — ABNORMAL LOW (ref 3.87–5.11)
RDW: 16.8 % — ABNORMAL HIGH (ref 11.5–15.5)
WBC: 5.7 10*3/uL (ref 4.0–10.5)
nRBC: 0 % (ref 0.0–0.2)

## 2020-10-04 LAB — GLUCOSE, CAPILLARY: Glucose-Capillary: 124 mg/dL — ABNORMAL HIGH (ref 70–99)

## 2020-10-04 MED ORDER — LIDOCAINE-EPINEPHRINE 1 %-1:100000 IJ SOLN
INTRAMUSCULAR | Status: AC | PRN
  Administered 2020-10-04 (×2): 10 mL via INTRADERMAL

## 2020-10-04 MED ORDER — MIDAZOLAM HCL 2 MG/2ML IJ SOLN
INTRAMUSCULAR | Status: AC | PRN
  Administered 2020-10-04: 0.5 mg via INTRAVENOUS
  Administered 2020-10-04: 1 mg via INTRAVENOUS

## 2020-10-04 MED ORDER — HEPARIN SOD (PORK) LOCK FLUSH 100 UNIT/ML IV SOLN
INTRAVENOUS | Status: AC
  Filled 2020-10-04: qty 5

## 2020-10-04 MED ORDER — FENTANYL CITRATE (PF) 100 MCG/2ML IJ SOLN
INTRAMUSCULAR | Status: AC
  Filled 2020-10-04: qty 2

## 2020-10-04 MED ORDER — FENTANYL CITRATE (PF) 100 MCG/2ML IJ SOLN
INTRAMUSCULAR | Status: AC | PRN
  Administered 2020-10-04: 50 ug via INTRAVENOUS

## 2020-10-04 MED ORDER — MIDAZOLAM HCL 2 MG/2ML IJ SOLN
INTRAMUSCULAR | Status: AC
  Filled 2020-10-04: qty 2

## 2020-10-04 MED ORDER — SODIUM CHLORIDE 0.9 % IV SOLN: INTRAVENOUS | Status: DC

## 2020-10-04 MED ORDER — LIDOCAINE-EPINEPHRINE 1 %-1:100000 IJ SOLN
INTRAMUSCULAR | Status: AC
  Filled 2020-10-04: qty 1

## 2020-10-04 NOTE — Discharge Instructions (Signed)
Implanted Port Insertion, Care After This sheet gives you information about how to care for yourself after your procedure. Your health care provider may also give you more specific instructions. If you have problems or questions, contact your health care provider. What can I expect after the procedure? After the procedure, it is common to have:  Discomfort at the port insertion site.  Bruising on the skin over the port. This should improve over 3-4 days. Follow these instructions at home: Port care  After your port is placed, you will get a manufacturer's information card. The card has information about your port. Keep this card with you at all times.  Take care of the port as told by your health care provider. Ask your health care provider if you or a family member can get training for taking care of the port at home. A home health care nurse may also take care of the port.  Make sure to remember what type of port you have. Incision care  Follow instructions from your health care provider about how to take care of your port insertion site. Make sure you: ? Wash your hands with soap and water before and after you change your bandage (dressing). If soap and water are not available, use hand sanitizer. ? Change your dressing as told by your health care provider. ? Leave stitches (sutures), skin glue, or adhesive strips in place. These skin closures may need to stay in place for 2 weeks or longer. If adhesive strip edges start to loosen and curl up, you may trim the loose edges. Do not remove adhesive strips completely unless your health care provider tells you to do that.  Check your port insertion site every day for signs of infection. Check for: ? Redness, swelling, or pain. ? Fluid or blood. ? Warmth. ? Pus or a bad smell.      Activity  Return to your normal activities as told by your health care provider. Ask your health care provider what activities are safe for you.  Do not  lift anything that is heavier than 10 lb (4.5 kg), or the limit that you are told, until your health care provider says that it is safe. General instructions  Take over-the-counter and prescription medicines only as told by your health care provider.  Do not take baths, swim, or use a hot tub until your health care provider approves. Ask your health care provider if you may take showers. You may only be allowed to take sponge baths.  Do not drive for 24 hours if you were given a sedative during your procedure.  Wear a medical alert bracelet in case of an emergency. This will tell any health care providers that you have a port.  Keep all follow-up visits as told by your health care provider. This is important. Contact a health care provider if:  You cannot flush your port with saline as directed, or you cannot draw blood from the port.  You have a fever or chills.  You have redness, swelling, or pain around your port insertion site.  You have fluid or blood coming from your port insertion site.  Your port insertion site feels warm to the touch.  You have pus or a bad smell coming from the port insertion site. Get help right away if:  You have chest pain or shortness of breath.  You have bleeding from your port that you cannot control. Summary  Take care of the port as told by your   health care provider. Keep the manufacturer's information card with you at all times.  Change your dressing as told by your health care provider.  Contact a health care provider if you have a fever or chills or if you have redness, swelling, or pain around your port insertion site.  Keep all follow-up visits as told by your health care provider. This information is not intended to replace advice given to you by your health care provider. Make sure you discuss any questions you have with your health care provider. Document Revised: 02/03/2018 Document Reviewed: 02/03/2018 Elsevier Patient Education   2021 Elsevier Inc.  Moderate Conscious Sedation, Adult, Care After This sheet gives you information about how to care for yourself after your procedure. Your health care provider may also give you more specific instructions. If you have problems or questions, contact your health care provider. What can I expect after the procedure? After the procedure, it is common to have:  Sleepiness for several hours.  Impaired judgment for several hours.  Difficulty with balance.  Vomiting if you eat too soon. Follow these instructions at home: For the time period you were told by your health care provider:  Rest.  Do not participate in activities where you could fall or become injured.  Do not drive or use machinery.  Do not drink alcohol.  Do not take sleeping pills or medicines that cause drowsiness.  Do not make important decisions or sign legal documents.  Do not take care of children on your own.      Eating and drinking  Follow the diet recommended by your health care provider.  Drink enough fluid to keep your urine pale yellow.  If you vomit: ? Drink water, juice, or soup when you can drink without vomiting. ? Make sure you have little or no nausea before eating solid foods.   General instructions  Take over-the-counter and prescription medicines only as told by your health care provider.  Have a responsible adult stay with you for the time you are told. It is important to have someone help care for you until you are awake and alert.  Do not smoke.  Keep all follow-up visits as told by your health care provider. This is important. Contact a health care provider if:  You are still sleepy or having trouble with balance after 24 hours.  You feel light-headed.  You keep feeling nauseous or you keep vomiting.  You develop a rash.  You have a fever.  You have redness or swelling around the IV site. Get help right away if:  You have trouble breathing.  You have  new-onset confusion at home. Summary  After the procedure, it is common to feel sleepy, have impaired judgment, or feel nauseous if you eat too soon.  Rest after you get home. Know the things you should not do after the procedure.  Follow the diet recommended by your health care provider and drink enough fluid to keep your urine pale yellow.  Get help right away if you have trouble breathing or new-onset confusion at home. This information is not intended to replace advice given to you by your health care provider. Make sure you discuss any questions you have with your health care provider. Document Revised: 11/05/2019 Document Reviewed: 06/03/2019 Elsevier Patient Education  2021 Elsevier Inc.  

## 2020-10-04 NOTE — Telephone Encounter (Signed)
TCT patient's sister, [name] and pt' son, [name] for the purpose of explaining the unique way this patient's appt need to be due to her dialysis/ Pt will have labs done and see Dr. Lorenso Courier on Wednesdays; she will have her chemo AFTER her dialysis on Thursdays. We need the chemo to stay in her as long as possible before her next dialysis. So her chemo will be in her from Thursday afternoon until her next dialysis on saturdays. This schedule will repeat itself every 2 weeks. [name] and patient voiced understanding.  Advised that our scheduler will call this week for this  Schedule that starts next week. All voiced understanding.

## 2020-10-04 NOTE — Telephone Encounter (Signed)
Call placed to patient to inquire. No answer. No VM.   

## 2020-10-04 NOTE — Procedures (Signed)
Pre Procedure Dx: Poor venous access Post Procedural Dx: Same  Successful placement of right IJ approach port-a-cath with tip at the superior caval atrial junction. The catheter is ready for immediate use.  Estimated Blood Loss: Minimal  Complications: None immediate.  Jay Rainer Mounce, MD Pager #: [phone]   

## 2020-10-04 NOTE — H&P (Signed)
Chief Complaint: Patient was seen in consultation today for metastatic colon cancer  Referring Physician(s): Dorsey,John T IV  Supervising Physician: Sandi Mariscal  Patient Status: [facility] - Out-pt  History of Present Illness: [name] is a 70 y.o. female with a medical history significant for asthma, anemia, renal cell carcinoma (s/p right radical nephrectomy), DM2 with retinopathy/neuropathy, PVD, ESRD on HD and HTN recently evaluated for nausea, vomiting, abdominal pain found to have an obstructive colon mass.  She underwent colectomy 08/30/20 and now has plans for upcoming chemotherapy.  She is in need of durable venous access.  She presented to [facility] IR for [facility] placement 10/02/20, however as found to have hyperkalemia after missing a session of HD.  She was sent to the ED and has now had dialysis twice- once on 3/14, once 3/15.  She returns today for Port-A-Cath placement.   [name] has been NPO for the procedure today.  She denies new symptoms or complaints.  Reports she is feeling well s/p dialysis.  Agreeable to procedure today.   Past Medical History:  Diagnosis Date  . Anemia   . Anemia associated with chronic renal failure   . Anemia in chronic kidney disease 09/29/2018  . Arthritis   . Asthma   . Blood transfusion without reported diagnosis    Phreesia 02/27/2020  . Cancer ([place])    Phreesia 02/27/2020  . Cataract    OD  . Chronic kidney disease    Phreesia 02/27/2020  . Coronary artery calcification seen on CAT scan 02/17/2018   Coronary calcification on CT  . Depression   . Diabetes mellitus without complication ([place])    Phreesia 02/27/2020  . Diabetic retinopathy of both eyes ([place])   . Diabetic ulcer of right foot associated with type 2 diabetes mellitus ([place])   . ESRD (end stage renal disease) on dialysis ([place])   . Essential hypertension 02/17/2018   Essential hypertension  . GERD (gastroesophageal reflux disease)    pt denies  . HLD (hyperlipidemia)   .  Hypertension   . Hypertensive retinopathy    OU  . Left ventricular dysfunction 04/07/2018   Left ventricle dysfunction  . Microalbuminuria due to type 2 diabetes mellitus ([place])   . Neuromuscular disorder ([place])    diabetic neuropathy  . Nonproliferative retinopathy due to secondary diabetes ([place])   . Nonsustained ventricular tachycardia ([place]) 08/28/2018   Nonsustained ventricular tachycardia  . Pneumonia    hx of   . Renal cell cancer ([place])   . Renal insufficiency   . Right renal mass 03/10/2017  . Type 2 diabetes, controlled, with neuropathy ([place]) 06/22/2013  . Ulcer of other part of foot 06/22/2013  . 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 [facility])     Past Surgical History:  Procedure Laterality Date  . AMPUTATION TOE Right 10/29/2019   Procedure: RIGHT 2ND TOE AMPUTATION;  Surgeon: Edrick Kins, DPM;  Location: [facility];  Service: Podiatry;  Laterality: Right;  . AMPUTATION TOE Bilateral 09/04/2020   Procedure: RIGHT THIRD DIGIT AMPUTATION;  Surgeon: Edrick Kins, DPM;  Location: [place];  Service: Podiatry;  Laterality: Bilateral;  . BASCILIC VEIN TRANSPOSITION Left 08/08/2017   Procedure: BASILIC VEIN TRANSPOSITION FIRST STAGE LEFT ARM;  Surgeon: Serafina Mitchell, MD;  Location: [place];  Service: Vascular;  Laterality: Left;  . BASCILIC VEIN TRANSPOSITION Left 05/14/2018   Procedure: SECOND STAGE BASILIC VEIN TRANSPOSITION LEFT ARM;  Surgeon: Serafina Mitchell, MD;  Location: [place];  Service: Vascular;  Laterality: Left;  . BIOPSY  08/29/2020   Procedure: BIOPSY;  Surgeon: Yetta Flock, MD;  Location: [place];  Service: Gastroenterology;;  . CATARACT EXTRACTION Left   . COLONOSCOPY WITH PROPOFOL N/A 08/29/2020   Procedure: COLONOSCOPY WITH PROPOFOL;  Surgeon: Yetta Flock, MD;  Location: [place];  Service: Gastroenterology;  Laterality: N/A;  . EYE SURGERY    . ILEOSTOMY Right 08/30/2020   Procedure: ILEOSTOMY;  Surgeon: Jesusita Oka, MD;   Location: [place];  Service: General;  Laterality: Right;  . PARTIAL COLECTOMY N/A 08/30/2020   Procedure: HEMI COLECTOMY;  Surgeon: Jesusita Oka, MD;  Location: [place];  Service: General;  Laterality: N/A;  . POLYPECTOMY  08/29/2020   Procedure: POLYPECTOMY;  Surgeon: Yetta Flock, MD;  Location: [facility];  Service: Gastroenterology;;  . ROBOTIC ADRENALECTOMY Left 03/10/2017   Procedure: XI ROBOTIC ADRENALECTOMY;  Surgeon: Nickie Retort, MD;  [facility];  Service: Urology;  Laterality: Left;  . ROBOTIC ASSITED PARTIAL NEPHRECTOMY Right 03/10/2017   Procedure: XI ROBOTIC ASSITED RADICAL NEPHRECTOMY;  Surgeon: Nickie Retort, MD;  [facility];  Service: Urology;  Laterality: Right;  . SUBMUCOSAL TATTOO INJECTION  08/29/2020   Procedure: SUBMUCOSAL TATTOO INJECTION;  Surgeon: Yetta Flock, MD;  Location: [facility];  Service: Gastroenterology;;  . TOE AMPUTATION Bilateral    both great toe ,, left foot 2nd toe 1/2  . TOE ARTHROPLASTY  09/04/2020   Procedure: ARTHROPLASTY OF THE THIRD DIGIT LEFT FOOT;  Surgeon: Edrick Kins, DPM;  Location[facility];  Service: Podiatry;;    Allergies: Latex, Penicillins, and Adhesive [tape]  Medications: Prior to Admission medications   Medication Sig Start Date End Date Taking? Authorizing Provider  Blood Glucose Monitoring Suppl (ONE TOUCH ULTRA 2) w/Device KIT Use to check BS BID-QID Dx:E11.9 05/19/17   [name], MD  BREO ELLIPTA 100-25 MCG/INH AEPB Inhale 1 puff by mouth once daily Patient taking differently: Inhale 1 puff into the lungs daily. 08/09/20   [name], MD  Calcium Carbonate Antacid (TUMS PO) Take 1 tablet by mouth 3 (three) times daily with meals.    [provider]  Carboxymethylcellulose Sodium (THERATEARS OP) Place 1 drop into both eyes daily as needed (dry eyes).    [provider]  diphenhydrAMINE (BENADRYL) 25 MG tablet Take 25 mg by mouth daily as needed for  allergies or itching. Patient not taking: No sig reported    [provider]  doxercalciferol (HECTOROL) 4 MCG/2ML injection Inject 1.5 mLs (3 mcg total) into the vein Every Tuesday,Thursday,and Saturday with dialysis. 09/07/20   Oswald Hillock, MD  gabapentin (NEURONTIN) 300 MG capsule TAKE 1 CAPSULE BY MOUTH THREE TIMES DAILY. REQUIRES OFFICE VISIT BEFORE ANY FURTHER REFILLS CAN BE GIVEN Patient taking differently: Take 300 mg by mouth 3 (three) times daily as needed (nerve pain). 06/21/20   [name], MD  insulin aspart (NOVOLOG) 100 UNIT/ML injection Inject 0-6 Units into the skin 3 (three) times daily with meals. Sliding scale insulin Less than 70 initiate hypoglycemia protocol 70-120  0 units 120-150 0 unit 151-200 1 units 201-250 2 units 251-300 3 units 301-350 4 units 351-400 5 units Greater than 400 call MD and give 6 units 09/06/20   Iraq, Marge Duncans, MD  insulin degludec (TRESIBA FLEXTOUCH) 100 UNIT/ML FlexTouch Pen Inject 20 Units into the skin daily. Hold if fasting blood sugars are <130. 09/06/20   Oswald Hillock,  MD  Insulin Pen Needle (LIVE BETTER PEN NEEDLES) 31G X 6 MM MISC To use with Tresiba pens daily 02/05/19   [name], MD  Lancets [facility] ULTRASOFT) lancets Use as instructed 05/19/17   [name], MD  midodrine (PROAMATINE) 5 MG tablet Take 1 tablet (5 mg total) by mouth Every Tuesday,Thursday,and Saturday with dialysis. 09/07/20   Oswald Hillock, MD  multivitamin (RENA-VIT) TABS tablet Take 1 tablet by mouth daily.    [provider]  [facility] ULTRA test strip USE AS DIRECTED TO MONITOR  FSBS 3 TIMES DAILY 01/27/20   Ishmael Holter A, FNP  oxyCODONE (ROXICODONE) 5 MG immediate release tablet Take 1 tablet (5 mg total) by mouth every 8 (eight) hours as needed for up to 10 doses. Patient not taking: No sig reported 09/06/20   Oswald Hillock, MD  PROAIR HFA 108 [phone] Base) MCG/ACT inhaler INHALE 2 PUFFS BY MOUTH EVERY 6 HOURS AS NEEDED FOR WHEEZING FOR  SHORTNESS OF BREATH Patient taking differently: Inhale 2 puffs into the lungs every 6 (six) hours as needed for wheezing or shortness of breath. 08/09/20   [name], MD  rosuvastatin (CRESTOR) 20 MG tablet Take 1 tablet (20 mg total) by mouth daily. 11/23/19   [name], MD  SANTYL ointment Apply 1 application topically daily as needed (wound care). 03/29/20   [provider]  traZODone (DESYREL) 50 MG tablet Take 0.5 tablets (25 mg total) by mouth at bedtime as needed for sleep. Patient not taking: No sig reported 09/06/20   Oswald Hillock, MD     Family History  Problem Relation Age of Onset  . Heart disease Mother   . Diabetes Sister     Social History   Socioeconomic History  . Marital status: Single    Spouse name: Not on file  . Number of children: 2  . Years of education: 6  . Highest education level: Not on file  Occupational History  . Not on file  Tobacco Use  . Smoking status: Never Smoker  . Smokeless tobacco: Never Used  Vaping Use  . Vaping Use: Never used  Substance and Sexual Activity  . Alcohol use: Yes    Alcohol/week: 0.0 standard drinks    Comment: occ  . Drug use: No  . Sexual activity: Not Currently    Partners: Male  Other Topics Concern  . Not on file  Social History Narrative  . Not on file   Social Determinants of Health   Financial Resource Strain: Low Risk   . Difficulty of Paying Living Expenses: Not very hard  Food Insecurity: Not on file  Transportation Needs: Not on file  Physical Activity: Not on file  Stress: Not on file  Social Connections: Not on file     Review of Systems: A 12 point ROS discussed and pertinent positives are indicated in the HPI above.  All other systems are negative.  Review of Systems  Constitutional: Negative for fatigue and fever.  Respiratory: Negative for cough and shortness of breath.   Cardiovascular: Negative for chest pain.  Gastrointestinal: Negative for abdominal pain.   Genitourinary: Negative for dysuria.  Musculoskeletal: Negative for back pain.  Psychiatric/Behavioral: Negative for behavioral problems and confusion.    Vital Signs: BP 94/74   Pulse 84   Temp 98 F (36.7 C)   Resp 18   Ht 5' 7"  (1.702 m)   Wt 199 lb 1.2 oz (90.3 kg)   SpO2  100%   BMI 31.18 kg/m   Physical Exam Vitals and nursing note reviewed.  Constitutional:      General: She is not in acute distress.    Appearance: Normal appearance. She is not ill-appearing.  HENT:     Mouth/Throat:     Mouth: Mucous membranes are moist.     Pharynx: Oropharynx is clear.  Cardiovascular:     Rate and Rhythm: Normal rate and regular rhythm.  Pulmonary:     Effort: Pulmonary effort is normal. No respiratory distress.     Breath sounds: Normal breath sounds.  Abdominal:     General: Abdomen is flat.     Palpations: Abdomen is soft.     Comments: Colostomy in place.   Skin:    General: Skin is warm and dry.  Neurological:     General: No focal deficit present.     Mental Status: She is alert and oriented to person, place, and time. Mental status is at baseline.  Psychiatric:        Mood and Affect: Mood normal.        Behavior: Behavior normal.        Thought Content: Thought content normal.        Judgment: Judgment normal.      MD Evaluation Airway: WNL Heart: WNL Abdomen: WNL Chest/ Lungs: WNL ASA  Classification: 3 Mallampati/Airway Score: Two   Imaging: No results found.  Labs:  CBC: Recent Labs    09/07/20 0737 09/22/20 1535 10/02/20 1335 10/04/20 1039  WBC 6.8 4.7 7.7 5.7  HGB 7.6* 9.1* 10.5* 9.7*  HCT 23.3* 28.4* 33.1* 30.7*  PLT 148* 175 215 160    COAGS: Recent Labs    10/02/20 1335  INR 1.0    BMP: Recent Labs    10/29/19 1530 11/03/19 1131 11/08/19 0852 02/07/20 1159 05/19/20 1254 09/07/20 0733 09/22/20 1535 10/02/20 1335 10/02/20 2016 10/04/20 1039  NA 138   < > 140 143   < > 132* 138 139  --  140  K 3.9   < > 3.8 5.2    < > 4.4 5.1 7.5* 3.5 3.9  CL 97*   < > 98 99   < > 96* 95* 100  --  96*  CO2 26   < > 26 32   < > 21* 31 21*  --  30  GLUCOSE 140*   < > 192* 102*   < > 115* 154* 152*  --  151*  BUN 18   < > 46* 27*   < > 41* 20 64*  --  22  CALCIUM 8.5*   < > 8.2* 8.7   < > 8.0* 9.1 8.3*  --  8.7*  CREATININE 4.66*   < > 7.38* 6.19*   < > 7.14* 5.07* 9.06*  --  4.67*  GFRNONAA 9*  --  5* 6*   < > 6* 9* 4*  --  10*  GFRAA 10*  --  6* 7*  --   --   --   --   --   --    < > = values in this interval not displayed.    LIVER FUNCTION TESTS: Recent Labs    05/19/20 1254 08/27/20 1350 08/28/20 0424 08/29/20 0303 08/30/20 0252 09/07/20 0733 09/22/20 1535  BILITOT 1.1 1.1 0.9  --   --   --  0.4  AST 16 20 17   --   --   --  14*  ALT 11 12 11   --   --   --  11  ALKPHOS 53 72 59  --   --   --  65  PROT 7.8 7.4 6.3*  --   --   --  6.8  ALBUMIN 3.6 3.5 2.9* 2.5* 2.3* 1.9* 3.0*    TUMOR MARKERS: No results for input(s): AFPTM, CEA, CA199, CHROMGRNA in the last 8760 hours.  Assessment and Plan: Patient with past medical history of metastatic colon cancer presents for Port-A-Cath placement at the request of Dr. Lorenso Courier IV.  Case reviewed by Dr. Pascal Lux who approves patient for procedure.  Patient presents today in their usual state of health.  She has been NPO and is not currently on blood thinners.  K 3.9 today.  Ok to proceed.   Risks and benefits of image guided port-a-catheter placement was discussed with the patient including, but not limited to bleeding, infection, pneumothorax, or fibrin sheath development and need for additional procedures.  All of the patient's questions were answered, patient is agreeable to proceed. Consent signed and in chart.  Thank you for this interesting consult.  I greatly enjoyed meeting [name] and look forward to participating in their care.  A copy of this report was sent to the requesting provider on this date.  Electronically Signed: Docia Barrier, PA 10/04/2020, 11:17 AM   I spent a total of  15 Minutes   in face to face in clinical consultation, greater than 50% of which was counseling/coordinating care for metastatic colon cancer.

## 2020-10-05 ENCOUNTER — Ambulatory Visit: Payer: Medicare Other | Admitting: Hematology and Oncology

## 2020-10-05 ENCOUNTER — Encounter: Payer: Self-pay | Admitting: Hematology and Oncology

## 2020-10-05 ENCOUNTER — Ambulatory Visit: Payer: Medicare Other

## 2020-10-05 NOTE — Progress Notes (Signed)
Reviewed looking for new schedule date to review for [employer]. Noticed appointment for today was canceled.  Will arrange to meet with patient on new schedule date.  She has my card for any questions or concerns.

## 2020-10-06 DIAGNOSIS — E1142 Type 2 diabetes mellitus with diabetic polyneuropathy: Secondary | ICD-10-CM | POA: Diagnosis not present

## 2020-10-06 DIAGNOSIS — N2581 Secondary hyperparathyroidism of renal origin: Secondary | ICD-10-CM | POA: Diagnosis not present

## 2020-10-06 DIAGNOSIS — L89626 Pressure-induced deep tissue damage of left heel: Secondary | ICD-10-CM | POA: Diagnosis not present

## 2020-10-06 DIAGNOSIS — D689 Coagulation defect, unspecified: Secondary | ICD-10-CM | POA: Diagnosis not present

## 2020-10-06 DIAGNOSIS — D509 Iron deficiency anemia, unspecified: Secondary | ICD-10-CM | POA: Diagnosis not present

## 2020-10-06 DIAGNOSIS — Z7951 Long term (current) use of inhaled steroids: Secondary | ICD-10-CM | POA: Diagnosis not present

## 2020-10-06 DIAGNOSIS — D631 Anemia in chronic kidney disease: Secondary | ICD-10-CM | POA: Diagnosis not present

## 2020-10-06 DIAGNOSIS — J45909 Unspecified asthma, uncomplicated: Secondary | ICD-10-CM | POA: Diagnosis not present

## 2020-10-06 DIAGNOSIS — N186 End stage renal disease: Secondary | ICD-10-CM | POA: Diagnosis not present

## 2020-10-06 DIAGNOSIS — Z794 Long term (current) use of insulin: Secondary | ICD-10-CM | POA: Diagnosis not present

## 2020-10-06 DIAGNOSIS — E1122 Type 2 diabetes mellitus with diabetic chronic kidney disease: Secondary | ICD-10-CM | POA: Diagnosis not present

## 2020-10-06 DIAGNOSIS — I132 Hypertensive heart and chronic kidney disease with heart failure and with stage 5 chronic kidney disease, or end stage renal disease: Secondary | ICD-10-CM | POA: Diagnosis not present

## 2020-10-06 DIAGNOSIS — I509 Heart failure, unspecified: Secondary | ICD-10-CM | POA: Diagnosis not present

## 2020-10-06 DIAGNOSIS — E785 Hyperlipidemia, unspecified: Secondary | ICD-10-CM | POA: Diagnosis not present

## 2020-10-06 DIAGNOSIS — M1990 Unspecified osteoarthritis, unspecified site: Secondary | ICD-10-CM | POA: Diagnosis not present

## 2020-10-06 DIAGNOSIS — E1165 Type 2 diabetes mellitus with hyperglycemia: Secondary | ICD-10-CM | POA: Diagnosis not present

## 2020-10-06 DIAGNOSIS — F32A Depression, unspecified: Secondary | ICD-10-CM | POA: Diagnosis not present

## 2020-10-06 DIAGNOSIS — E113299 Type 2 diabetes mellitus with mild nonproliferative diabetic retinopathy without macular edema, unspecified eye: Secondary | ICD-10-CM | POA: Diagnosis not present

## 2020-10-06 DIAGNOSIS — E1151 Type 2 diabetes mellitus with diabetic peripheral angiopathy without gangrene: Secondary | ICD-10-CM | POA: Diagnosis not present

## 2020-10-06 DIAGNOSIS — Z9181 History of falling: Secondary | ICD-10-CM | POA: Diagnosis not present

## 2020-10-06 DIAGNOSIS — I472 Ventricular tachycardia: Secondary | ICD-10-CM | POA: Diagnosis not present

## 2020-10-06 DIAGNOSIS — D630 Anemia in neoplastic disease: Secondary | ICD-10-CM | POA: Diagnosis not present

## 2020-10-06 DIAGNOSIS — Z992 Dependence on renal dialysis: Secondary | ICD-10-CM | POA: Diagnosis not present

## 2020-10-06 DIAGNOSIS — I2510 Atherosclerotic heart disease of native coronary artery without angina pectoris: Secondary | ICD-10-CM | POA: Diagnosis not present

## 2020-10-06 DIAGNOSIS — E46 Unspecified protein-calorie malnutrition: Secondary | ICD-10-CM | POA: Diagnosis not present

## 2020-10-06 DIAGNOSIS — C182 Malignant neoplasm of ascending colon: Secondary | ICD-10-CM | POA: Diagnosis not present

## 2020-10-07 DIAGNOSIS — N186 End stage renal disease: Secondary | ICD-10-CM | POA: Diagnosis not present

## 2020-10-07 DIAGNOSIS — D631 Anemia in chronic kidney disease: Secondary | ICD-10-CM | POA: Diagnosis not present

## 2020-10-07 DIAGNOSIS — Z992 Dependence on renal dialysis: Secondary | ICD-10-CM | POA: Diagnosis not present

## 2020-10-07 DIAGNOSIS — D509 Iron deficiency anemia, unspecified: Secondary | ICD-10-CM | POA: Diagnosis not present

## 2020-10-07 DIAGNOSIS — N2581 Secondary hyperparathyroidism of renal origin: Secondary | ICD-10-CM | POA: Diagnosis not present

## 2020-10-07 DIAGNOSIS — D689 Coagulation defect, unspecified: Secondary | ICD-10-CM | POA: Diagnosis not present

## 2020-10-08 DIAGNOSIS — E46 Unspecified protein-calorie malnutrition: Secondary | ICD-10-CM | POA: Diagnosis not present

## 2020-10-08 DIAGNOSIS — E785 Hyperlipidemia, unspecified: Secondary | ICD-10-CM | POA: Diagnosis not present

## 2020-10-08 DIAGNOSIS — F32A Depression, unspecified: Secondary | ICD-10-CM | POA: Diagnosis not present

## 2020-10-08 DIAGNOSIS — Z794 Long term (current) use of insulin: Secondary | ICD-10-CM | POA: Diagnosis not present

## 2020-10-08 DIAGNOSIS — D509 Iron deficiency anemia, unspecified: Secondary | ICD-10-CM | POA: Diagnosis not present

## 2020-10-08 DIAGNOSIS — E1142 Type 2 diabetes mellitus with diabetic polyneuropathy: Secondary | ICD-10-CM | POA: Diagnosis not present

## 2020-10-08 DIAGNOSIS — I2510 Atherosclerotic heart disease of native coronary artery without angina pectoris: Secondary | ICD-10-CM | POA: Diagnosis not present

## 2020-10-08 DIAGNOSIS — E1165 Type 2 diabetes mellitus with hyperglycemia: Secondary | ICD-10-CM | POA: Diagnosis not present

## 2020-10-08 DIAGNOSIS — D631 Anemia in chronic kidney disease: Secondary | ICD-10-CM | POA: Diagnosis not present

## 2020-10-08 DIAGNOSIS — E1122 Type 2 diabetes mellitus with diabetic chronic kidney disease: Secondary | ICD-10-CM | POA: Diagnosis not present

## 2020-10-08 DIAGNOSIS — Z7951 Long term (current) use of inhaled steroids: Secondary | ICD-10-CM | POA: Diagnosis not present

## 2020-10-08 DIAGNOSIS — N186 End stage renal disease: Secondary | ICD-10-CM | POA: Diagnosis not present

## 2020-10-08 DIAGNOSIS — E1151 Type 2 diabetes mellitus with diabetic peripheral angiopathy without gangrene: Secondary | ICD-10-CM | POA: Diagnosis not present

## 2020-10-08 DIAGNOSIS — L89626 Pressure-induced deep tissue damage of left heel: Secondary | ICD-10-CM | POA: Diagnosis not present

## 2020-10-08 DIAGNOSIS — M1990 Unspecified osteoarthritis, unspecified site: Secondary | ICD-10-CM | POA: Diagnosis not present

## 2020-10-08 DIAGNOSIS — N2581 Secondary hyperparathyroidism of renal origin: Secondary | ICD-10-CM | POA: Diagnosis not present

## 2020-10-08 DIAGNOSIS — C182 Malignant neoplasm of ascending colon: Secondary | ICD-10-CM | POA: Diagnosis not present

## 2020-10-08 DIAGNOSIS — I472 Ventricular tachycardia: Secondary | ICD-10-CM | POA: Diagnosis not present

## 2020-10-08 DIAGNOSIS — Z9181 History of falling: Secondary | ICD-10-CM | POA: Diagnosis not present

## 2020-10-08 DIAGNOSIS — I132 Hypertensive heart and chronic kidney disease with heart failure and with stage 5 chronic kidney disease, or end stage renal disease: Secondary | ICD-10-CM | POA: Diagnosis not present

## 2020-10-08 DIAGNOSIS — D630 Anemia in neoplastic disease: Secondary | ICD-10-CM | POA: Diagnosis not present

## 2020-10-08 DIAGNOSIS — I509 Heart failure, unspecified: Secondary | ICD-10-CM | POA: Diagnosis not present

## 2020-10-08 DIAGNOSIS — E113299 Type 2 diabetes mellitus with mild nonproliferative diabetic retinopathy without macular edema, unspecified eye: Secondary | ICD-10-CM | POA: Diagnosis not present

## 2020-10-08 DIAGNOSIS — J45909 Unspecified asthma, uncomplicated: Secondary | ICD-10-CM | POA: Diagnosis not present

## 2020-10-09 ENCOUNTER — Telehealth: Payer: Self-pay

## 2020-10-09 ENCOUNTER — Telehealth: Payer: Self-pay | Admitting: *Deleted

## 2020-10-09 NOTE — Telephone Encounter (Signed)
[name] white from [place] called for verbal, Home Health for 9wks, 1x per wk

## 2020-10-09 NOTE — Telephone Encounter (Signed)
Received call from [place], [facility] SN with [facility] [phone] telephone.   Requested VO for [facility] SN services 2x weekly x8 weeks with additional PRN visit x1 for wound care to L heel (unstageable pressure ulcer), ostomy care education and medication education.   VO given.

## 2020-10-09 NOTE — Progress Notes (Signed)
Subjective:  Patient presents today status post bilateral third toe surgery. DOS: 09/05/2020.  Patient states that she has developed a blister/sore to the posterior aspect of the right heel by using the wheelchair and using it to push off with.  Other than that she has no new complaints at this time  Past Medical History:  Diagnosis Date  . Anemia   . Anemia associated with chronic renal failure   . Anemia in chronic kidney disease 09/29/2018  . Arthritis   . Asthma   . Blood transfusion without reported diagnosis    Phreesia 02/27/2020  . Cancer ([place])    Phreesia 02/27/2020  . Cataract    OD  . Chronic kidney disease    Phreesia 02/27/2020  . Coronary artery calcification seen on CAT scan 02/17/2018   Coronary calcification on CT  . Depression   . Diabetes mellitus without complication ([place])    Phreesia 02/27/2020  . Diabetic retinopathy of both eyes ([place])   . Diabetic ulcer of right foot associated with type 2 diabetes mellitus ([place])   . ESRD (end stage renal disease) on dialysis ([place])   . Essential hypertension 02/17/2018   Essential hypertension  . GERD (gastroesophageal reflux disease)    pt denies  . HLD (hyperlipidemia)   . Hypertension   . Hypertensive retinopathy    OU  . Left ventricular dysfunction 04/07/2018   Left ventricle dysfunction  . Microalbuminuria due to type 2 diabetes mellitus ([place])   . Neuromuscular disorder ([place])    diabetic neuropathy  . Nonproliferative retinopathy due to secondary diabetes ([place])   . Nonsustained ventricular tachycardia ([place]) 08/28/2018   Nonsustained ventricular tachycardia  . Pneumonia    hx of   . Renal cell cancer ([place])   . Renal insufficiency   . Right renal mass 03/10/2017  . Type 2 diabetes, controlled, with neuropathy ([place]) 06/22/2013  . Ulcer of other part of foot 06/22/2013  . 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 ([place])       Objective/Physical Exam Neurovascular status intact.  Skin incisions  appear to be well coapted with sutures intact with exception of a few sutures that have loosened to remove a left total arthroplasty.  There is no dehiscence noted though.  No sign of infectious process noted. No dehiscence. No active bleeding noted. Moderate edema noted to the surgical extremity.  Patient has developed a new ulcer to the posterior aspect of the right heel.  Is a very superficial overlying eschar to the posterior tubercle of the heel.  Measures approximately 1 cm in diameter.  No drainage.  No malodor noted.  Assessment: 1. s/p right third toe amputation.  Left third toe arthroplasty. DOS: 09/05/2020 2.  Ulcer right posterior heel   Plan of Care:  1. Patient was evaluated.  2.  Dressings changed today.   3.  Today sutures removed today. 4.  Recommend Betadine ointment to the posterior heel daily 5.  Return to clinic in 3-4 weeks   Edrick Kins, DPM [facility]  Dr. Edrick Kins, DPM    [address], [place] [address]                Office [phone]  Fax [phone]

## 2020-10-09 NOTE — Telephone Encounter (Signed)
Call placed to patient. No answer. No VM.  

## 2020-10-10 DIAGNOSIS — N186 End stage renal disease: Secondary | ICD-10-CM | POA: Diagnosis not present

## 2020-10-10 DIAGNOSIS — D631 Anemia in chronic kidney disease: Secondary | ICD-10-CM | POA: Diagnosis not present

## 2020-10-10 DIAGNOSIS — N2581 Secondary hyperparathyroidism of renal origin: Secondary | ICD-10-CM | POA: Diagnosis not present

## 2020-10-10 DIAGNOSIS — Z992 Dependence on renal dialysis: Secondary | ICD-10-CM | POA: Diagnosis not present

## 2020-10-10 DIAGNOSIS — Z933 Colostomy status: Secondary | ICD-10-CM | POA: Diagnosis not present

## 2020-10-10 DIAGNOSIS — D509 Iron deficiency anemia, unspecified: Secondary | ICD-10-CM | POA: Diagnosis not present

## 2020-10-10 DIAGNOSIS — K631 Perforation of intestine (nontraumatic): Secondary | ICD-10-CM | POA: Diagnosis not present

## 2020-10-10 DIAGNOSIS — D689 Coagulation defect, unspecified: Secondary | ICD-10-CM | POA: Diagnosis not present

## 2020-10-11 ENCOUNTER — Inpatient Hospital Stay: Payer: Medicare Other

## 2020-10-11 ENCOUNTER — Telehealth: Payer: Self-pay | Admitting: Hematology and Oncology

## 2020-10-11 ENCOUNTER — Telehealth: Payer: Self-pay

## 2020-10-11 ENCOUNTER — Other Ambulatory Visit: Payer: Self-pay

## 2020-10-11 ENCOUNTER — Telehealth: Payer: Self-pay | Admitting: Physician Assistant

## 2020-10-11 ENCOUNTER — Inpatient Hospital Stay: Payer: Medicare Other | Admitting: Physician Assistant

## 2020-10-11 ENCOUNTER — Telehealth: Payer: Self-pay | Admitting: *Deleted

## 2020-10-11 VITALS — BP 93/63 | HR 107 | Temp 97.1°F | Resp 18 | Ht 67.0 in

## 2020-10-11 DIAGNOSIS — J9601 Acute respiratory failure with hypoxia: Secondary | ICD-10-CM | POA: Diagnosis not present

## 2020-10-11 DIAGNOSIS — F05 Delirium due to known physiological condition: Secondary | ICD-10-CM | POA: Diagnosis present

## 2020-10-11 DIAGNOSIS — Z452 Encounter for adjustment and management of vascular access device: Secondary | ICD-10-CM | POA: Diagnosis not present

## 2020-10-11 DIAGNOSIS — C189 Malignant neoplasm of colon, unspecified: Secondary | ICD-10-CM

## 2020-10-11 DIAGNOSIS — I1311 Hypertensive heart and chronic kidney disease without heart failure, with stage 5 chronic kidney disease, or end stage renal disease: Secondary | ICD-10-CM | POA: Diagnosis not present

## 2020-10-11 DIAGNOSIS — D689 Coagulation defect, unspecified: Secondary | ICD-10-CM | POA: Diagnosis not present

## 2020-10-11 DIAGNOSIS — I2510 Atherosclerotic heart disease of native coronary artery without angina pectoris: Secondary | ICD-10-CM | POA: Diagnosis not present

## 2020-10-11 DIAGNOSIS — Z5111 Encounter for antineoplastic chemotherapy: Secondary | ICD-10-CM

## 2020-10-11 DIAGNOSIS — J9690 Respiratory failure, unspecified, unspecified whether with hypoxia or hypercapnia: Secondary | ICD-10-CM | POA: Diagnosis not present

## 2020-10-11 DIAGNOSIS — E1129 Type 2 diabetes mellitus with other diabetic kidney complication: Secondary | ICD-10-CM | POA: Diagnosis not present

## 2020-10-11 DIAGNOSIS — N2581 Secondary hyperparathyroidism of renal origin: Secondary | ICD-10-CM | POA: Diagnosis not present

## 2020-10-11 DIAGNOSIS — I4901 Ventricular fibrillation: Secondary | ICD-10-CM | POA: Diagnosis not present

## 2020-10-11 DIAGNOSIS — G9389 Other specified disorders of brain: Secondary | ICD-10-CM | POA: Diagnosis not present

## 2020-10-11 DIAGNOSIS — I517 Cardiomegaly: Secondary | ICD-10-CM | POA: Diagnosis not present

## 2020-10-11 DIAGNOSIS — R4701 Aphasia: Secondary | ICD-10-CM | POA: Diagnosis not present

## 2020-10-11 DIAGNOSIS — F32A Depression, unspecified: Secondary | ICD-10-CM | POA: Diagnosis not present

## 2020-10-11 DIAGNOSIS — R0902 Hypoxemia: Secondary | ICD-10-CM | POA: Diagnosis not present

## 2020-10-11 DIAGNOSIS — Z66 Do not resuscitate: Secondary | ICD-10-CM | POA: Diagnosis not present

## 2020-10-11 DIAGNOSIS — R9431 Abnormal electrocardiogram [ECG] [EKG]: Secondary | ICD-10-CM | POA: Diagnosis not present

## 2020-10-11 DIAGNOSIS — R579 Shock, unspecified: Secondary | ICD-10-CM | POA: Diagnosis not present

## 2020-10-11 DIAGNOSIS — I5043 Acute on chronic combined systolic (congestive) and diastolic (congestive) heart failure: Secondary | ICD-10-CM | POA: Diagnosis not present

## 2020-10-11 DIAGNOSIS — I10 Essential (primary) hypertension: Secondary | ICD-10-CM | POA: Diagnosis not present

## 2020-10-11 DIAGNOSIS — R531 Weakness: Secondary | ICD-10-CM | POA: Diagnosis not present

## 2020-10-11 DIAGNOSIS — Z4682 Encounter for fitting and adjustment of non-vascular catheter: Secondary | ICD-10-CM | POA: Diagnosis not present

## 2020-10-11 DIAGNOSIS — E1122 Type 2 diabetes mellitus with diabetic chronic kidney disease: Secondary | ICD-10-CM | POA: Diagnosis not present

## 2020-10-11 DIAGNOSIS — I6523 Occlusion and stenosis of bilateral carotid arteries: Secondary | ICD-10-CM | POA: Diagnosis not present

## 2020-10-11 DIAGNOSIS — G931 Anoxic brain damage, not elsewhere classified: Secondary | ICD-10-CM | POA: Diagnosis not present

## 2020-10-11 DIAGNOSIS — Z992 Dependence on renal dialysis: Secondary | ICD-10-CM | POA: Diagnosis not present

## 2020-10-11 DIAGNOSIS — R410 Disorientation, unspecified: Secondary | ICD-10-CM | POA: Diagnosis not present

## 2020-10-11 DIAGNOSIS — D509 Iron deficiency anemia, unspecified: Secondary | ICD-10-CM | POA: Diagnosis not present

## 2020-10-11 DIAGNOSIS — D631 Anemia in chronic kidney disease: Secondary | ICD-10-CM | POA: Diagnosis not present

## 2020-10-11 DIAGNOSIS — N186 End stage renal disease: Secondary | ICD-10-CM | POA: Diagnosis not present

## 2020-10-11 DIAGNOSIS — C786 Secondary malignant neoplasm of retroperitoneum and peritoneum: Secondary | ICD-10-CM | POA: Diagnosis not present

## 2020-10-11 DIAGNOSIS — I472 Ventricular tachycardia: Secondary | ICD-10-CM | POA: Diagnosis not present

## 2020-10-11 DIAGNOSIS — Z9911 Dependence on respirator [ventilator] status: Secondary | ICD-10-CM | POA: Diagnosis not present

## 2020-10-11 DIAGNOSIS — T797XXA Traumatic subcutaneous emphysema, initial encounter: Secondary | ICD-10-CM | POA: Diagnosis not present

## 2020-10-11 DIAGNOSIS — G9340 Encephalopathy, unspecified: Secondary | ICD-10-CM | POA: Diagnosis not present

## 2020-10-11 DIAGNOSIS — G459 Transient cerebral ischemic attack, unspecified: Secondary | ICD-10-CM | POA: Diagnosis not present

## 2020-10-11 DIAGNOSIS — R29818 Other symptoms and signs involving the nervous system: Secondary | ICD-10-CM | POA: Diagnosis not present

## 2020-10-11 DIAGNOSIS — E113299 Type 2 diabetes mellitus with mild nonproliferative diabetic retinopathy without macular edema, unspecified eye: Secondary | ICD-10-CM | POA: Diagnosis not present

## 2020-10-11 DIAGNOSIS — J3489 Other specified disorders of nose and nasal sinuses: Secondary | ICD-10-CM | POA: Diagnosis not present

## 2020-10-11 DIAGNOSIS — Z20822 Contact with and (suspected) exposure to covid-19: Secondary | ICD-10-CM | POA: Diagnosis not present

## 2020-10-11 DIAGNOSIS — I132 Hypertensive heart and chronic kidney disease with heart failure and with stage 5 chronic kidney disease, or end stage renal disease: Secondary | ICD-10-CM | POA: Diagnosis not present

## 2020-10-11 DIAGNOSIS — I639 Cerebral infarction, unspecified: Secondary | ICD-10-CM | POA: Diagnosis not present

## 2020-10-11 DIAGNOSIS — R4182 Altered mental status, unspecified: Secondary | ICD-10-CM | POA: Diagnosis not present

## 2020-10-11 DIAGNOSIS — J439 Emphysema, unspecified: Secondary | ICD-10-CM | POA: Diagnosis not present

## 2020-10-11 DIAGNOSIS — G9341 Metabolic encephalopathy: Secondary | ICD-10-CM | POA: Diagnosis not present

## 2020-10-11 DIAGNOSIS — S270XXA Traumatic pneumothorax, initial encounter: Secondary | ICD-10-CM | POA: Diagnosis not present

## 2020-10-11 DIAGNOSIS — E1165 Type 2 diabetes mellitus with hyperglycemia: Secondary | ICD-10-CM | POA: Diagnosis not present

## 2020-10-11 DIAGNOSIS — Z515 Encounter for palliative care: Secondary | ICD-10-CM | POA: Diagnosis not present

## 2020-10-11 DIAGNOSIS — L89623 Pressure ulcer of left heel, stage 3: Secondary | ICD-10-CM | POA: Diagnosis not present

## 2020-10-11 LAB — CBC WITH DIFFERENTIAL (CANCER CENTER ONLY)
Abs Immature Granulocytes: 0.02 10*3/uL (ref 0.00–0.07)
Basophils Absolute: 0 10*3/uL (ref 0.0–0.1)
Basophils Relative: 1 %
Eosinophils Absolute: 0.1 10*3/uL (ref 0.0–0.5)
Eosinophils Relative: 1 %
HCT: 29.7 % — ABNORMAL LOW (ref 36.0–46.0)
Hemoglobin: 9.9 g/dL — ABNORMAL LOW (ref 12.0–15.0)
Immature Granulocytes: 0 %
Lymphocytes Relative: 21 %
Lymphs Abs: 1.4 10*3/uL (ref 0.7–4.0)
MCH: 33.1 pg (ref 26.0–34.0)
MCHC: 33.3 g/dL (ref 30.0–36.0)
MCV: 99.3 fL (ref 80.0–100.0)
Monocytes Absolute: 0.4 10*3/uL (ref 0.1–1.0)
Monocytes Relative: 7 %
Neutro Abs: 4.6 10*3/uL (ref 1.7–7.7)
Neutrophils Relative %: 70 %
Platelet Count: 162 10*3/uL (ref 150–400)
RBC: 2.99 MIL/uL — ABNORMAL LOW (ref 3.87–5.11)
RDW: 15.8 % — ABNORMAL HIGH (ref 11.5–15.5)
WBC Count: 6.6 10*3/uL (ref 4.0–10.5)
nRBC: 0 % (ref 0.0–0.2)

## 2020-10-11 LAB — CMP (CANCER CENTER ONLY)
ALT: 11 U/L (ref 0–44)
AST: 15 U/L (ref 15–41)
Albumin: 3.3 g/dL — ABNORMAL LOW (ref 3.5–5.0)
Alkaline Phosphatase: 66 U/L (ref 38–126)
Anion gap: 15 (ref 5–15)
BUN: 18 mg/dL (ref 8–23)
CO2: 31 mmol/L (ref 22–32)
Calcium: 9.2 mg/dL (ref 8.9–10.3)
Chloride: 98 mmol/L (ref 98–111)
Creatinine: 5.82 mg/dL (ref 0.44–1.00)
GFR, Estimated: 7 mL/min — ABNORMAL LOW (ref >60)
Glucose, Bld: 208 mg/dL — ABNORMAL HIGH (ref 70–99)
Potassium: 4.6 mmol/L (ref 3.5–5.1)
Sodium: 144 mmol/L (ref 135–145)
Total Bilirubin: 0.4 mg/dL (ref 0.3–1.2)
Total Protein: 7.2 g/dL (ref 6.5–8.1)

## 2020-10-11 MED ORDER — LIDOCAINE-PRILOCAINE 2.5-2.5 % EX CREA: 1.0000 "application " | TOPICAL_CREAM | CUTANEOUS | 0 refills | Status: AC | PRN

## 2020-10-11 MED ORDER — PROCHLORPERAZINE MALEATE 10 MG PO TABS: 10.0000 mg | ORAL_TABLET | Freq: Four times a day (QID) | ORAL | 0 refills | Status: AC | PRN

## 2020-10-11 MED ORDER — ONDANSETRON HCL 8 MG PO TABS: 8.0000 mg | ORAL_TABLET | Freq: Three times a day (TID) | ORAL | 0 refills | Status: AC | PRN

## 2020-10-11 NOTE — Telephone Encounter (Signed)
Called and spoke with patients sister, [name]. Confirmed today and tomorrows appts

## 2020-10-11 NOTE — Telephone Encounter (Signed)
Scheduled appts needed for today. Called, not able to get in touch with patient or leave msg. Will attempt to call again.

## 2020-10-11 NOTE — Telephone Encounter (Signed)
Called both the patients sister and son. Left msgs for appts scheduled for today. Advised still waiting for chemo infusion time for tomorrow

## 2020-10-11 NOTE — Progress Notes (Signed)
[place] [phone]) [phone]   [phone]  PROGRESS NOTE  Patient Care Team: [name], MD as PCP - General (Family Medicine) Constance Haw, MD as PCP - Electrophysiology (Cardiology) Lorretta Harp, MD as PCP - Cardiology (Cardiology) Edythe Clarity, [facility] as Pharmacist (Pharmacist) Orson Slick, MD as Consulting Physician (Hematology and Oncology) Jonnie Finner, RN as Oncology Nurse Navigator  Hematological/Oncological History # Metastatic Colon Cancer (Pathology staging pT4a, pN2a,  PM1c)  08/27/2020-09/07/2020: admitted to [facility] with acute abdominal pain, intractable nausea and vomiting. CT abdomen and pelvis revealed small and large bowel obstruction secondary to previously demonstrated colonic mass in the ascending colon 08/29/2020: Colonoscopy performed, biopsy of malignant tumor showed a poorly differentiated adenocarcinoma 08/30/2020: underwent a Hemicolectomy, pathology revealed invasive poorly differentiated adenocarcinoma with mucinous and signet ring cell features, 5 cm, involving the ascending colon. Pathological staging pT4a, pN2a, pM1c  10/12/2020: planned Cycle 1 Day 1 of FOLFOX Bevacizumab  Interval History:  [name] 70 y.o. female with medical history significant for metastatic colon cancer who presents for a follow up visit prior to starting Cycle 1 Day 1 of FOLFOX/Bev. Patient is unaccompanied for this visit.  She reports that her energy levels are overall stable.  She can do her basic ADLs on her own but does require some assistance from her sister who she lives with.  She has a good appetite without any noticeable weight changes.  Patient reports intermittent episodes of nausea without vomiting.  She currently does not take any antiemetics.  Patient denies any abdominal pain and has good output from her ostomy.  She denies any medication or melena.  Patient denies any fevers, chills, sweats, shortness of  breath, chest pain or cough.  She has no other complaints.  Rest of the 10 point ROS is below.  MEDICAL HISTORY:  Past Medical History:  Diagnosis Date  . Anemia   . Anemia associated with chronic renal failure   . Anemia in chronic kidney disease 09/29/2018  . Arthritis   . Asthma   . Blood transfusion without reported diagnosis    Phreesia 02/27/2020  . Cancer ([place])    Phreesia 02/27/2020  . Cataract    OD  . Chronic kidney disease    Phreesia 02/27/2020  . Coronary artery calcification seen on CAT scan 02/17/2018   Coronary calcification on CT  . Depression   . Diabetes mellitus without complication ([place])    Phreesia 02/27/2020  . Diabetic retinopathy of both eyes ([place])   . Diabetic ulcer of right foot associated with type 2 diabetes mellitus ([place])   . ESRD (end stage renal disease) on dialysis ([place])   . Essential hypertension 02/17/2018   Essential hypertension  . GERD (gastroesophageal reflux disease)    pt denies  . HLD (hyperlipidemia)   . Hypertension   . Hypertensive retinopathy    OU  . Left ventricular dysfunction 04/07/2018   Left ventricle dysfunction  . Microalbuminuria due to type 2 diabetes mellitus ([employer])   . Neuromuscular disorder ([place])    diabetic neuropathy  . Nonproliferative retinopathy due to secondary diabetes ([place])   . Nonsustained ventricular tachycardia ([place]) 08/28/2018   Nonsustained ventricular tachycardia  . Pneumonia    hx of   . Renal cell cancer ([place])   . Renal insufficiency   . Right renal mass 03/10/2017  . Type 2 diabetes, controlled, with neuropathy ([place]) 06/22/2013  . Ulcer of other part of foot 06/22/2013  .  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 AMPUTATION TOE Right 10/29/2019   Procedure: RIGHT 2ND TOE AMPUTATION;  Surgeon: Edrick Kins, DPM;  Location: [facility];  Service: Podiatry;  Laterality: Right;  . AMPUTATION TOE Bilateral  09/04/2020   Procedure: RIGHT THIRD DIGIT AMPUTATION;  Surgeon: Edrick Kins, DPM;  Location: [place];  Service: Podiatry;  Laterality: Bilateral;  . BASCILIC VEIN TRANSPOSITION Left 08/08/2017   Procedure: BASILIC VEIN TRANSPOSITION FIRST STAGE LEFT ARM;  Surgeon: Serafina Mitchell, MD;  Location: [place];  Service: Vascular;  Laterality: Left;  . BASCILIC VEIN TRANSPOSITION Left 05/14/2018   Procedure: SECOND STAGE BASILIC VEIN TRANSPOSITION LEFT ARM;  Surgeon: Serafina Mitchell, MD;  Location: [place];  Service: Vascular;  Laterality: Left;  . BIOPSY  08/29/2020   Procedure: BIOPSY;  Surgeon: Yetta Flock, MD;  Location: [place];  Service: Gastroenterology;;  . CATARACT EXTRACTION Left   . COLONOSCOPY WITH PROPOFOL N/A 08/29/2020   Procedure: COLONOSCOPY WITH PROPOFOL;  Surgeon: Yetta Flock, MD;  Location: [place];  Service: Gastroenterology;  Laterality: N/A;  . EYE SURGERY    . ILEOSTOMY Right 08/30/2020   Procedure: ILEOSTOMY;  Surgeon: Jesusita Oka, MD;  Location: [place];  Service: General;  Laterality: Right;  . IR IMAGING GUIDED PORT INSERTION  10/04/2020  . PARTIAL COLECTOMY N/A 08/30/2020   Procedure: HEMI COLECTOMY;  Surgeon: Jesusita Oka, MD;  Location: [place];  Service: General;  Laterality: N/A;  . POLYPECTOMY  08/29/2020   Procedure: POLYPECTOMY;  Surgeon: Yetta Flock, MD;  Location: [facility];  Service: Gastroenterology;;  . ROBOTIC ADRENALECTOMY Left 03/10/2017   Procedure: XI ROBOTIC ADRENALECTOMY;  Surgeon: Nickie Retort, MD;  Location: [facility];  Service: Urology;  Laterality: Left;  . ROBOTIC ASSITED PARTIAL NEPHRECTOMY Right 03/10/2017   Procedure: XI ROBOTIC ASSITED RADICAL NEPHRECTOMY;  Surgeon: Nickie Retort, MD;  Location[facility];  Service: Urology;  Laterality: Right;  . SUBMUCOSAL TATTOO INJECTION  08/29/2020   Procedure: SUBMUCOSAL TATTOO INJECTION;  Surgeon: Yetta Flock, MD;  Location: [facility];  Service:  Gastroenterology;;  . TOE AMPUTATION Bilateral    both great toe ,, left foot 2nd toe 1/2  . TOE ARTHROPLASTY  09/04/2020   Procedure: ARTHROPLASTY OF THE THIRD DIGIT LEFT FOOT;  Surgeon: Edrick Kins, DPM;  Location: [place] [facility];  Service: Podiatry;;    SOCIAL HISTORY: Social History   Socioeconomic History  . Marital status: Single    Spouse name: Not on file  . Number of children: 2  . Years of education: 59  . Highest education level: Not on file  Occupational History  . Not on file  Tobacco Use  . Smoking status: Never Smoker  . Smokeless tobacco: Never Used  Vaping Use  . Vaping Use: Never used  Substance and Sexual Activity  . Alcohol use: Yes    Alcohol/week: 0.0 standard drinks    Comment: occ  . Drug use: No  . Sexual activity: Not Currently    Partners: Male  Other Topics Concern  . Not on file  Social History Narrative  . Not on file   Social Determinants of Health   Financial Resource Strain: Low Risk   . Difficulty of Paying Living Expenses: Not very hard  Food Insecurity: Not on file  Transportation Needs: Not on file  Physical Activity: Not on file  Stress: Not on file  Social Connections: Not on file  Intimate Partner Violence: Not on file    FAMILY HISTORY: Family History  Problem Relation Age of Onset  . Heart disease Mother   . Diabetes Sister     ALLERGIES:  is allergic to latex, penicillins, and adhesive [tape].  MEDICATIONS:  Current Outpatient Medications  Medication Sig Dispense Refill  . lidocaine-prilocaine (EMLA) cream Apply 1 application topically as needed. 30 g 0  . ondansetron (ZOFRAN) 8 MG tablet Take 1 tablet (8 mg total) by mouth every 8 (eight) hours as needed for nausea or vomiting. 90 tablet 0  . prochlorperazine (COMPAZINE) 10 MG tablet Take 1 tablet (10 mg total) by mouth every 6 (six) hours as needed for nausea or vomiting. 90 tablet 0  . Blood Glucose Monitoring Suppl (ONE TOUCH ULTRA 2) w/Device KIT Use to check BS  BID-QID Dx:E11.9 1 each 1  . BREO ELLIPTA 100-25 MCG/INH AEPB Inhale 1 puff by mouth once daily (Patient taking differently: Inhale 1 puff into the lungs daily.) 180 each 0  . Calcium Carbonate Antacid (TUMS PO) Take 1 tablet by mouth 3 (three) times daily with meals.    . Carboxymethylcellulose Sodium (THERATEARS OP) Place 1 drop into both eyes daily as needed (dry eyes).    . diphenhydrAMINE (BENADRYL) 25 MG tablet Take 25 mg by mouth daily as needed for allergies or itching. (Patient not taking: No sig reported)    . doxercalciferol (HECTOROL) 4 MCG/2ML injection Inject 1.5 mLs (3 mcg total) into the vein Every Tuesday,Thursday,and Saturday with dialysis. 2 mL   . gabapentin (NEURONTIN) 300 MG capsule TAKE 1 CAPSULE BY MOUTH THREE TIMES DAILY. REQUIRES OFFICE VISIT BEFORE ANY FURTHER REFILLS CAN BE GIVEN (Patient taking differently: Take 300 mg by mouth 3 (three) times daily as needed (nerve pain).) 90 capsule 0  . insulin aspart (NOVOLOG) 100 UNIT/ML injection Inject 0-6 Units into the skin 3 (three) times daily with meals. Sliding scale insulin Less than 70 initiate hypoglycemia protocol 70-120  0 units 120-150 0 unit 151-200 1 units 201-250 2 units 251-300 3 units 301-350 4 units 351-400 5 units Greater than 400 call MD and give 6 units 10 mL 11  . insulin degludec (TRESIBA FLEXTOUCH) 100 UNIT/ML FlexTouch Pen Inject 20 Units into the skin daily. Hold if fasting blood sugars are <130. 45 mL 0  . Insulin Pen Needle (LIVE BETTER PEN NEEDLES) 31G X 6 MM MISC To use with Tresiba pens daily 100 each 3  . Lancets (ONETOUCH ULTRASOFT) lancets Use as instructed 300 each 3  . midodrine (PROAMATINE) 5 MG tablet Take 1 tablet (5 mg total) by mouth Every Tuesday,Thursday,and Saturday with dialysis.    Marland Kitchen multivitamin (RENA-VIT) TABS tablet Take 1 tablet by mouth daily.    Glory Rosebush ULTRA test strip USE AS DIRECTED TO MONITOR  FSBS 3 TIMES DAILY 300 strip 3  . oxyCODONE (ROXICODONE) 5 MG immediate release  tablet Take 1 tablet (5 mg total) by mouth every 8 (eight) hours as needed for up to 10 doses. (Patient not taking: No sig reported) 10 tablet 0  . PROAIR HFA 108 (90 Base) MCG/ACT inhaler INHALE 2 PUFFS BY MOUTH EVERY 6 HOURS AS NEEDED FOR WHEEZING FOR SHORTNESS OF BREATH (Patient taking differently: Inhale 2 puffs into the lungs every 6 (six) hours as needed for wheezing or shortness of breath.) 9 g 0  . rosuvastatin (CRESTOR) 20 MG tablet Take 1 tablet (20 mg total) by mouth  daily. 90 tablet 3  . SANTYL ointment Apply 1 application topically daily as needed (wound care).    . traZODone (DESYREL) 50 MG tablet Take 0.5 tablets (25 mg total) by mouth at bedtime as needed for sleep. (Patient not taking: No sig reported)     No current facility-administered medications for this visit.    REVIEW OF SYSTEMS:   Constitutional: ( - ) fevers, ( - )  chills , ( - ) night sweats Eyes: ( - ) blurriness of vision, ( - ) double vision, ( - ) watery eyes Ears, nose, mouth, throat, and face: ( - ) mucositis, ( - ) sore throat Respiratory: ( - ) cough, ( - ) dyspnea, ( - ) wheezes Cardiovascular: ( - ) palpitation, ( - ) chest discomfort, ( - ) lower extremity swelling Gastrointestinal:  ( - ) nausea, ( - ) heartburn, ( - ) change in bowel habits Skin: ( - ) abnormal skin rashes Lymphatics: ( - ) new lymphadenopathy, ( - ) easy bruising Neurological: ( - ) numbness, ( - ) tingling, ( - ) new weaknesses Behavioral/Psych: ( - ) mood change, ( - ) new changes  All other systems were reviewed with the patient and are negative.  PHYSICAL EXAMINATION: ECOG PERFORMANCE STATUS: 1 - Symptomatic but completely ambulatory  Vitals:   10/11/20 1358  BP: 93/63  Pulse: (!) 107  Resp: 18  Temp: (!) 97.1 F (36.2 C)  SpO2: 100%   Filed Weights    GENERAL: chronically ill appearing elderly Caucasian female. alert, no distress and comfortable SKIN: skin color, texture, turgor are normal, no rashes or significant  lesions EYES: conjunctiva are pink and non-injected, sclera clear LUNGS: clear to auscultation and percussion with normal breathing effort HEART: regular rate & rhythm and no murmurs and no lower extremity edema ABDOMEN: soft, non-tender, non-distended, normal bowel sounds. Well healing surgical scar. Ostomy in place producing brown liquid stool.  Musculoskeletal: no cyanosis of digits and no clubbing  PSYCH: alert & oriented x 3, fluent speech NEURO: no focal motor/sensory deficits  LABORATORY DATA:  I have reviewed the data as listed CBC Latest Ref Rng & Units 10/11/2020 10/04/2020 10/02/2020  WBC 4.0 - 10.5 K/uL 6.6 5.7 7.7  Hemoglobin 12.0 - 15.0 g/dL 9.9(L) 9.7(L) 10.5(L)  Hematocrit 36.0 - 46.0 % 29.7(L) 30.7(L) 33.1(L)  Platelets 150 - 400 K/uL 162 160 215    CMP Latest Ref Rng & Units 10/11/2020 10/04/2020 10/02/2020  Glucose 70 - 99 mg/dL 208(H) 151(H) -  BUN 8 - 23 mg/dL 18 22 -  Creatinine 0.44 - 1.00 mg/dL 5.82(HH) 4.67(H) -  Sodium 135 - 145 mmol/L 144 140 -  Potassium 3.5 - 5.1 mmol/L 4.6 3.9 3.5  Chloride 98 - 111 mmol/L 98 96(L) -  CO2 22 - 32 mmol/L 31 30 -  Calcium 8.9 - 10.3 mg/dL 9.2 8.7(L) -  Total Protein 6.5 - 8.1 g/dL 7.2 - -  Total Bilirubin 0.3 - 1.2 mg/dL 0.4 - -  Alkaline Phos 38 - 126 U/L 66 - -  AST 15 - 41 U/L 15 - -  ALT 0 - 44 U/L 11 - -   RADIOGRAPHIC STUDIES: IR IMAGING GUIDED PORT INSERTION  Result Date: 10/04/2020 INDICATION: History of colon cancer. In need of durable intravenous access for chemotherapy administration. EXAM: IMPLANTED PORT A CATH PLACEMENT WITH ULTRASOUND AND FLUOROSCOPIC GUIDANCE COMPARISON:  None. MEDICATIONS: None ANESTHESIA/SEDATION: Moderate (conscious) sedation was employed during this procedure. A total of Versed  1.5 mg and Fentanyl 50 mcg was administered intravenously. Moderate Sedation Time: 25 minutes. The patient's level of consciousness and vital signs were monitored continuously by radiology nursing throughout the  procedure under my direct supervision. CONTRAST:  None FLUOROSCOPY TIME:  24 seconds (9 mGy) COMPLICATIONS: None immediate. PROCEDURE: The procedure, risks, benefits, and alternatives were explained to the patient. Questions regarding the procedure were encouraged and answered. The patient understands and consents to the procedure. The right neck and chest were prepped with chlorhexidine in a sterile fashion, and a sterile drape was applied covering the operative field. Maximum barrier sterile technique with sterile gowns and gloves were used for the procedure. A timeout was performed prior to the initiation of the procedure. Local anesthesia was provided with 1% lidocaine with epinephrine. After creating a small venotomy incision, a micropuncture kit was utilized to access the internal jugular vein. Real-time ultrasound guidance was utilized for vascular access including the acquisition of a permanent ultrasound image documenting patency of the accessed vessel. The microwire was utilized to measure appropriate catheter length. A subcutaneous port pocket was then created along the upper chest wall utilizing a combination of sharp and blunt dissection. The pocket was irrigated with sterile saline. A single lumen "Slim" sized power injectable port was chosen for placement. The 8 Fr catheter was tunneled from the port pocket site to the venotomy incision. The port was placed in the pocket. The external catheter was trimmed to appropriate length. At the venotomy, an 8 Fr peel-away sheath was placed over a guidewire under fluoroscopic guidance. The catheter was then placed through the sheath and the sheath was removed. Final catheter positioning was confirmed and documented with a fluoroscopic spot radiograph. The port was accessed with a Huber needle, aspirated and flushed with heparinized saline. The venotomy site was closed with an interrupted 4-0 Vicryl suture. The port pocket incision was closed with interrupted 2-0  Vicryl suture. The skin was opposed with a running subcuticular 4-0 Vicryl suture. Dermabond and Steri-strips were applied to both incisions. Dressings were applied. The patient tolerated the procedure well without immediate post procedural complication. FINDINGS: After catheter placement, the tip lies within the superior cavoatrial junction. The catheter aspirates and flushes normally and is ready for immediate use. IMPRESSION: Successful placement of a right internal jugular approach power injectable Port-A-Cath. The catheter is ready for immediate use. Electronically Signed   By: Sandi Mariscal M.D.   On: 10/04/2020 14:08    ASSESSMENT & PLAN [name] 70 y.o. female with medical history significant for metastatic colon cancer who presents for a follow up visit.   On exam today patient is doing well without any significant changes to her health since the last visit. The plan is to administer FOLFOX plus bevacizumab as palliative chemotherapy starting tomorrow after her dialysis.  Dose adjustments for this are to include a 30% reduction in oxaliplatin and no dose reduction in 5-FU.  Patient will need to coordinate her chemotherapy with her dialysis. She will need to start her chemotherapy after dialysis on Thursdays and return for the 5FU pump disconnect on Saturday before her dialysis.   # Metastatic Colon Cancer (Pathology staging pT4a, pN2a,  PM1c)  --Findings are most consistent with a metastatic colon cancer with metastasis to the peritoneal surface.   --Unfortunately the nature of this disease is incurable.  All chemotherapy moving forward would be palliative.  The patient voiced her understanding of this. --Plan to proceed with cycle 1 day 1 of FOLFOX plus  Bevacizumab tomorrow, 10/12/2020 --RTC in 2 weeks prior to cycle 2 day 1 of FOLFOX plus bevacizumab.  #Supportive Care --completed chemotherapy education  --port placement has been placed --zofran 53m q8H PRN and compazine 148mPO q6H  for nausea -- EMLA cream for port -- no pain medication required at this time.    Orders Placed This Encounter  Procedures  . CBC with Differential (Cancer Center Only)    Standing Status:   Future    Number of Occurrences:   1    Standing Expiration Date:   10/11/2021  . CMP ([facility])    Standing Status:   Future    Number of Occurrences:   1    Standing Expiration Date:   10/11/2021  . Urinalysis, dipstick only    All questions were answered. The patient knows to call the clinic with any problems, questions or concerns.  A total of more than 30 minutes were spent on this encounter and over half of that time was spent on counseling and coordination of care as outlined above.   IrDede QueryA-C Department of Hematology/Oncology [facility]: [phone] 10/11/2020 4:38 PM

## 2020-10-11 NOTE — Telephone Encounter (Signed)
Late entry for 10/10/20: Patient called office, LVM asking when her appts would be scheduled this week. Contacted [facility] scheduling to confirm that they are working on scheduling her lab/MD/infusion appts. Scheduler confirmed they were and that they will contact patient on 3/22 and on 3/23 if needed to confirm appts.  Contacted patient with this information. Patient verbalized understanding.

## 2020-10-11 NOTE — Telephone Encounter (Signed)
CRITICAL VALUE STICKER  CRITICAL VALUE: Creatinine = 5.82  RECEIVER (on-site recipient of call): Yetta Glassman, CMA  DATE & TIME NOTIFIED: 10/11/20 at 3:48pm  MESSENGER (representative from lab): Suanne Marker  MD NOTIFIED: Dede Query, PA-C  TIME OF NOTIFICATION:  10/11/20 at 3:48pm  RESPONSE: Notification five to [facility] for follow-up with the pt.

## 2020-10-12 ENCOUNTER — Other Ambulatory Visit: Payer: Self-pay | Admitting: Hematology

## 2020-10-12 ENCOUNTER — Inpatient Hospital Stay: Payer: Medicare Other

## 2020-10-12 ENCOUNTER — Telehealth: Payer: Self-pay | Admitting: Physician Assistant

## 2020-10-12 VITALS — BP 135/81 | HR 92 | Temp 98.4°F | Resp 18 | Wt 192.0 lb

## 2020-10-12 DIAGNOSIS — Z992 Dependence on renal dialysis: Secondary | ICD-10-CM | POA: Diagnosis not present

## 2020-10-12 DIAGNOSIS — N2581 Secondary hyperparathyroidism of renal origin: Secondary | ICD-10-CM | POA: Diagnosis not present

## 2020-10-12 DIAGNOSIS — D631 Anemia in chronic kidney disease: Secondary | ICD-10-CM | POA: Diagnosis not present

## 2020-10-12 DIAGNOSIS — C189 Malignant neoplasm of colon, unspecified: Secondary | ICD-10-CM

## 2020-10-12 DIAGNOSIS — N186 End stage renal disease: Secondary | ICD-10-CM | POA: Diagnosis not present

## 2020-10-12 DIAGNOSIS — D509 Iron deficiency anemia, unspecified: Secondary | ICD-10-CM | POA: Diagnosis not present

## 2020-10-12 DIAGNOSIS — D689 Coagulation defect, unspecified: Secondary | ICD-10-CM | POA: Diagnosis not present

## 2020-10-12 DIAGNOSIS — Z5111 Encounter for antineoplastic chemotherapy: Secondary | ICD-10-CM | POA: Insufficient documentation

## 2020-10-12 MED ORDER — LEUCOVORIN CALCIUM INJECTION 350 MG
400.0000 mg/m2 | Freq: Once | INTRAVENOUS | Status: AC
  Administered 2020-10-12: 800 mg via INTRAVENOUS
  Filled 2020-10-12: qty 40

## 2020-10-12 MED ORDER — SODIUM CHLORIDE 0.9 % IV SOLN
2400.0000 mg/m2 | INTRAVENOUS | Status: DC
  Administered 2020-10-12: 4800 mg via INTRAVENOUS
  Filled 2020-10-12: qty 96

## 2020-10-12 MED ORDER — HEPARIN SOD (PORK) LOCK FLUSH 100 UNIT/ML IV SOLN
500.0000 [IU] | Freq: Once | INTRAVENOUS | Status: DC | PRN
  Filled 2020-10-12: qty 5

## 2020-10-12 MED ORDER — SODIUM CHLORIDE 0.9 % IV SOLN
10.0000 mg | Freq: Once | INTRAVENOUS | Status: AC
  Administered 2020-10-12: 10 mg via INTRAVENOUS
  Filled 2020-10-12: qty 10

## 2020-10-12 MED ORDER — PALONOSETRON HCL INJECTION 0.25 MG/5ML
0.2500 mg | Freq: Once | INTRAVENOUS | Status: AC
  Administered 2020-10-12: 0.25 mg via INTRAVENOUS

## 2020-10-12 MED ORDER — DEXTROSE 5 % IV SOLN
60.0000 mg/m2 | Freq: Once | INTRAVENOUS | Status: AC
  Administered 2020-10-12: 120 mg via INTRAVENOUS
  Filled 2020-10-12: qty 20

## 2020-10-12 MED ORDER — FLUOROURACIL CHEMO INJECTION 2.5 GM/50ML
400.0000 mg/m2 | Freq: Once | INTRAVENOUS | Status: AC
  Administered 2020-10-12: 800 mg via INTRAVENOUS
  Filled 2020-10-12: qty 16

## 2020-10-12 MED ORDER — SODIUM CHLORIDE 0.9% FLUSH
10.0000 mL | INTRAVENOUS | Status: DC | PRN
  Filled 2020-10-12: qty 10

## 2020-10-12 MED ORDER — DEXTROSE 5 % IV SOLN
Freq: Once | INTRAVENOUS | Status: AC
  Filled 2020-10-12: qty 250

## 2020-10-12 NOTE — Progress Notes (Signed)
PA states okay to treat with BP 89/58 HR 107

## 2020-10-12 NOTE — Telephone Encounter (Signed)
Scheduled follow-up appointments per 3/23 los. Patient is aware. 

## 2020-10-12 NOTE — Patient Instructions (Signed)
[facility] Discharge Instructions for Patients Receiving Chemotherapy  Today you received the following chemotherapy agents Oxaliplatin, Leukovorin, Florouricil,  To help prevent nausea and vomiting after your treatment, we encourage you to take your nausea medication as directed.   If you develop nausea and vomiting that is not controlled by your nausea medication, call the clinic.   BELOW ARE SYMPTOMS THAT SHOULD BE REPORTED IMMEDIATELY:  *FEVER GREATER THAN 100.5 F  *CHILLS WITH OR WITHOUT FEVER  NAUSEA AND VOMITING THAT IS NOT CONTROLLED WITH YOUR NAUSEA MEDICATION  *UNUSUAL SHORTNESS OF BREATH  *UNUSUAL BRUISING OR BLEEDING  TENDERNESS IN MOUTH AND THROAT WITH OR WITHOUT PRESENCE OF ULCERS  *URINARY PROBLEMS  *BOWEL PROBLEMS  UNUSUAL RASH Items with * indicate a potential emergency and should be followed up as soon as possible.  Feel free to call the clinic should you have any questions or concerns. The clinic phone number is [phone].  Please show the [place] at check-in to the Emergency Department and triage nurse.

## 2020-10-13 ENCOUNTER — Emergency Department (HOSPITAL_COMMUNITY)
Admit: 2020-10-13 | Discharge: 2020-10-13 | Disposition: A | Discharge status: Home / Self Care | Payer: Medicare Other | Attending: Neurology | Admitting: Neurology

## 2020-10-13 ENCOUNTER — Observation Stay (HOSPITAL_COMMUNITY): Payer: Medicare Other

## 2020-10-13 ENCOUNTER — Emergency Department (HOSPITAL_COMMUNITY): Payer: Medicare Other

## 2020-10-13 ENCOUNTER — Telehealth: Payer: Self-pay | Admitting: *Deleted

## 2020-10-13 ENCOUNTER — Inpatient Hospital Stay (HOSPITAL_COMMUNITY)
Admission: EM | Admit: 2020-10-13 | Discharge: 2020-10-20 | DRG: 064 | Disposition: E | Payer: Medicare Other | Attending: Critical Care Medicine | Admitting: Critical Care Medicine

## 2020-10-13 ENCOUNTER — Encounter (HOSPITAL_COMMUNITY): Payer: Self-pay | Admitting: Radiology

## 2020-10-13 DIAGNOSIS — J9601 Acute respiratory failure with hypoxia: Secondary | ICD-10-CM

## 2020-10-13 DIAGNOSIS — E1165 Type 2 diabetes mellitus with hyperglycemia: Secondary | ICD-10-CM | POA: Diagnosis present

## 2020-10-13 DIAGNOSIS — Z91040 Latex allergy status: Secondary | ICD-10-CM

## 2020-10-13 DIAGNOSIS — Z4682 Encounter for fitting and adjustment of non-vascular catheter: Secondary | ICD-10-CM | POA: Diagnosis not present

## 2020-10-13 DIAGNOSIS — I447 Left bundle-branch block, unspecified: Secondary | ICD-10-CM | POA: Diagnosis present

## 2020-10-13 DIAGNOSIS — Z452 Encounter for adjustment and management of vascular access device: Secondary | ICD-10-CM

## 2020-10-13 DIAGNOSIS — T451X5A Adverse effect of antineoplastic and immunosuppressive drugs, initial encounter: Secondary | ICD-10-CM | POA: Diagnosis present

## 2020-10-13 DIAGNOSIS — I2510 Atherosclerotic heart disease of native coronary artery without angina pectoris: Secondary | ICD-10-CM | POA: Diagnosis present

## 2020-10-13 DIAGNOSIS — Z96698 Presence of other orthopedic joint implants: Secondary | ICD-10-CM | POA: Diagnosis present

## 2020-10-13 DIAGNOSIS — G931 Anoxic brain damage, not elsewhere classified: Secondary | ICD-10-CM | POA: Diagnosis not present

## 2020-10-13 DIAGNOSIS — Z515 Encounter for palliative care: Secondary | ICD-10-CM

## 2020-10-13 DIAGNOSIS — T797XXA Traumatic subcutaneous emphysema, initial encounter: Secondary | ICD-10-CM | POA: Diagnosis not present

## 2020-10-13 DIAGNOSIS — Z79899 Other long term (current) drug therapy: Secondary | ICD-10-CM

## 2020-10-13 DIAGNOSIS — I462 Cardiac arrest due to underlying cardiac condition: Secondary | ICD-10-CM | POA: Diagnosis not present

## 2020-10-13 DIAGNOSIS — Z91048 Other nonmedicinal substance allergy status: Secondary | ICD-10-CM

## 2020-10-13 DIAGNOSIS — R4182 Altered mental status, unspecified: Secondary | ICD-10-CM | POA: Diagnosis present

## 2020-10-13 DIAGNOSIS — E113299 Type 2 diabetes mellitus with mild nonproliferative diabetic retinopathy without macular edema, unspecified eye: Secondary | ICD-10-CM | POA: Diagnosis not present

## 2020-10-13 DIAGNOSIS — L89623 Pressure ulcer of left heel, stage 3: Secondary | ICD-10-CM | POA: Diagnosis not present

## 2020-10-13 DIAGNOSIS — E1140 Type 2 diabetes mellitus with diabetic neuropathy, unspecified: Secondary | ICD-10-CM | POA: Diagnosis present

## 2020-10-13 DIAGNOSIS — I472 Ventricular tachycardia: Secondary | ICD-10-CM | POA: Diagnosis not present

## 2020-10-13 DIAGNOSIS — I5043 Acute on chronic combined systolic (congestive) and diastolic (congestive) heart failure: Secondary | ICD-10-CM | POA: Diagnosis present

## 2020-10-13 DIAGNOSIS — E875 Hyperkalemia: Secondary | ICD-10-CM | POA: Diagnosis not present

## 2020-10-13 DIAGNOSIS — G9340 Encephalopathy, unspecified: Secondary | ICD-10-CM

## 2020-10-13 DIAGNOSIS — L97519 Non-pressure chronic ulcer of other part of right foot with unspecified severity: Secondary | ICD-10-CM | POA: Diagnosis present

## 2020-10-13 DIAGNOSIS — G459 Transient cerebral ischemic attack, unspecified: Secondary | ICD-10-CM | POA: Diagnosis present

## 2020-10-13 DIAGNOSIS — G9341 Metabolic encephalopathy: Secondary | ICD-10-CM | POA: Diagnosis present

## 2020-10-13 DIAGNOSIS — Z89421 Acquired absence of other right toe(s): Secondary | ICD-10-CM

## 2020-10-13 DIAGNOSIS — Z905 Acquired absence of kidney: Secondary | ICD-10-CM

## 2020-10-13 DIAGNOSIS — Z85038 Personal history of other malignant neoplasm of large intestine: Secondary | ICD-10-CM

## 2020-10-13 DIAGNOSIS — R4701 Aphasia: Secondary | ICD-10-CM | POA: Diagnosis not present

## 2020-10-13 DIAGNOSIS — Z66 Do not resuscitate: Secondary | ICD-10-CM | POA: Diagnosis present

## 2020-10-13 DIAGNOSIS — I132 Hypertensive heart and chronic kidney disease with heart failure and with stage 5 chronic kidney disease, or end stage renal disease: Secondary | ICD-10-CM | POA: Diagnosis present

## 2020-10-13 DIAGNOSIS — C189 Malignant neoplasm of colon, unspecified: Secondary | ICD-10-CM

## 2020-10-13 DIAGNOSIS — F05 Delirium due to known physiological condition: Secondary | ICD-10-CM | POA: Diagnosis present

## 2020-10-13 DIAGNOSIS — C786 Secondary malignant neoplasm of retroperitoneum and peritoneum: Secondary | ICD-10-CM | POA: Diagnosis present

## 2020-10-13 DIAGNOSIS — D631 Anemia in chronic kidney disease: Secondary | ICD-10-CM | POA: Diagnosis present

## 2020-10-13 DIAGNOSIS — R570 Cardiogenic shock: Secondary | ICD-10-CM | POA: Diagnosis not present

## 2020-10-13 DIAGNOSIS — Z79891 Long term (current) use of opiate analgesic: Secondary | ICD-10-CM

## 2020-10-13 DIAGNOSIS — Z20822 Contact with and (suspected) exposure to covid-19: Secondary | ICD-10-CM | POA: Diagnosis not present

## 2020-10-13 DIAGNOSIS — E1122 Type 2 diabetes mellitus with diabetic chronic kidney disease: Secondary | ICD-10-CM | POA: Diagnosis present

## 2020-10-13 DIAGNOSIS — Z89422 Acquired absence of other left toe(s): Secondary | ICD-10-CM

## 2020-10-13 DIAGNOSIS — J9600 Acute respiratory failure, unspecified whether with hypoxia or hypercapnia: Secondary | ICD-10-CM

## 2020-10-13 DIAGNOSIS — R9431 Abnormal electrocardiogram [ECG] [EKG]: Secondary | ICD-10-CM | POA: Diagnosis not present

## 2020-10-13 DIAGNOSIS — Z794 Long term (current) use of insulin: Secondary | ICD-10-CM

## 2020-10-13 DIAGNOSIS — K219 Gastro-esophageal reflux disease without esophagitis: Secondary | ICD-10-CM | POA: Diagnosis present

## 2020-10-13 DIAGNOSIS — Z8249 Family history of ischemic heart disease and other diseases of the circulatory system: Secondary | ICD-10-CM

## 2020-10-13 DIAGNOSIS — I480 Paroxysmal atrial fibrillation: Secondary | ICD-10-CM | POA: Diagnosis present

## 2020-10-13 DIAGNOSIS — N2581 Secondary hyperparathyroidism of renal origin: Secondary | ICD-10-CM | POA: Diagnosis present

## 2020-10-13 DIAGNOSIS — S270XXA Traumatic pneumothorax, initial encounter: Secondary | ICD-10-CM | POA: Diagnosis not present

## 2020-10-13 DIAGNOSIS — D696 Thrombocytopenia, unspecified: Secondary | ICD-10-CM | POA: Diagnosis present

## 2020-10-13 DIAGNOSIS — Z992 Dependence on renal dialysis: Secondary | ICD-10-CM

## 2020-10-13 DIAGNOSIS — I4901 Ventricular fibrillation: Secondary | ICD-10-CM

## 2020-10-13 DIAGNOSIS — Z833 Family history of diabetes mellitus: Secondary | ICD-10-CM

## 2020-10-13 DIAGNOSIS — J9690 Respiratory failure, unspecified, unspecified whether with hypoxia or hypercapnia: Secondary | ICD-10-CM | POA: Diagnosis not present

## 2020-10-13 DIAGNOSIS — N186 End stage renal disease: Secondary | ICD-10-CM | POA: Diagnosis not present

## 2020-10-13 DIAGNOSIS — Z9049 Acquired absence of other specified parts of digestive tract: Secondary | ICD-10-CM

## 2020-10-13 DIAGNOSIS — L8990 Pressure ulcer of unspecified site, unspecified stage: Secondary | ICD-10-CM | POA: Insufficient documentation

## 2020-10-13 DIAGNOSIS — F32A Depression, unspecified: Secondary | ICD-10-CM | POA: Diagnosis present

## 2020-10-13 DIAGNOSIS — E1151 Type 2 diabetes mellitus with diabetic peripheral angiopathy without gangrene: Secondary | ICD-10-CM | POA: Diagnosis present

## 2020-10-13 DIAGNOSIS — E669 Obesity, unspecified: Secondary | ICD-10-CM | POA: Diagnosis present

## 2020-10-13 DIAGNOSIS — Z01818 Encounter for other preprocedural examination: Secondary | ICD-10-CM

## 2020-10-13 DIAGNOSIS — I6389 Other cerebral infarction: Principal | ICD-10-CM | POA: Diagnosis present

## 2020-10-13 DIAGNOSIS — R29707 NIHSS score 7: Secondary | ICD-10-CM | POA: Diagnosis present

## 2020-10-13 DIAGNOSIS — E1142 Type 2 diabetes mellitus with diabetic polyneuropathy: Secondary | ICD-10-CM | POA: Diagnosis present

## 2020-10-13 DIAGNOSIS — J939 Pneumothorax, unspecified: Secondary | ICD-10-CM

## 2020-10-13 DIAGNOSIS — D72829 Elevated white blood cell count, unspecified: Secondary | ICD-10-CM | POA: Diagnosis present

## 2020-10-13 DIAGNOSIS — E11621 Type 2 diabetes mellitus with foot ulcer: Secondary | ICD-10-CM | POA: Diagnosis present

## 2020-10-13 DIAGNOSIS — Z880 Allergy status to penicillin: Secondary | ICD-10-CM

## 2020-10-13 DIAGNOSIS — Z4659 Encounter for fitting and adjustment of other gastrointestinal appliance and device: Secondary | ICD-10-CM

## 2020-10-13 DIAGNOSIS — I9589 Other hypotension: Secondary | ICD-10-CM | POA: Diagnosis not present

## 2020-10-13 DIAGNOSIS — E785 Hyperlipidemia, unspecified: Secondary | ICD-10-CM | POA: Diagnosis present

## 2020-10-13 DIAGNOSIS — Z933 Colostomy status: Secondary | ICD-10-CM

## 2020-10-13 DIAGNOSIS — Z85528 Personal history of other malignant neoplasm of kidney: Secondary | ICD-10-CM

## 2020-10-13 DIAGNOSIS — Z6834 Body mass index (BMI) 34.0-34.9, adult: Secondary | ICD-10-CM

## 2020-10-13 LAB — DIFFERENTIAL
Abs Immature Granulocytes: 0.02 10*3/uL (ref 0.00–0.07)
Basophils Absolute: 0 10*3/uL (ref 0.0–0.1)
Basophils Relative: 1 %
Eosinophils Absolute: 0 10*3/uL (ref 0.0–0.5)
Eosinophils Relative: 0 %
Immature Granulocytes: 0 %
Lymphocytes Relative: 19 %
Lymphs Abs: 1.5 10*3/uL (ref 0.7–4.0)
Monocytes Absolute: 0.4 10*3/uL (ref 0.1–1.0)
Monocytes Relative: 5 %
Neutro Abs: 5.8 10*3/uL (ref 1.7–7.7)
Neutrophils Relative %: 75 %

## 2020-10-13 LAB — ETHANOL: Alcohol, Ethyl (B): <10 mg/dL (ref <10)

## 2020-10-13 LAB — AMMONIA: Ammonia: 31 umol/L (ref 9–35)

## 2020-10-13 LAB — RESP PANEL BY RT-PCR (FLU A&B, COVID) ARPGX2
Influenza A by PCR: NEGATIVE
Influenza B by PCR: NEGATIVE
SARS Coronavirus 2 by RT PCR: NEGATIVE

## 2020-10-13 LAB — CBG MONITORING, ED: Glucose-Capillary: 102 mg/dL — ABNORMAL HIGH (ref 70–99)

## 2020-10-13 LAB — CBC
HCT: 27.9 % — ABNORMAL LOW (ref 36.0–46.0)
HCT: 33.9 % — ABNORMAL LOW (ref 36.0–46.0)
Hemoglobin: 9.2 g/dL — ABNORMAL LOW (ref 12.0–15.0)
Hemoglobin: 11.4 g/dL — ABNORMAL LOW (ref 12.0–15.0)
MCH: 33 pg (ref 26.0–34.0)
MCH: 33.7 pg (ref 26.0–34.0)
MCHC: 33 g/dL (ref 30.0–36.0)
MCHC: 33.6 g/dL (ref 30.0–36.0)
MCV: 100 fL (ref 80.0–100.0)
MCV: 100.3 fL — ABNORMAL HIGH (ref 80.0–100.0)
Platelets: 148 10*3/uL — ABNORMAL LOW (ref 150–400)
Platelets: 169 10*3/uL (ref 150–400)
RBC: 2.79 MIL/uL — ABNORMAL LOW (ref 3.87–5.11)
RBC: 3.38 MIL/uL — ABNORMAL LOW (ref 3.87–5.11)
RDW: 15.1 % (ref 11.5–15.5)
RDW: 15.4 % (ref 11.5–15.5)
WBC: 7.7 10*3/uL (ref 4.0–10.5)
WBC: 12.1 10*3/uL — ABNORMAL HIGH (ref 4.0–10.5)
nRBC: 0 % (ref 0.0–0.2)
nRBC: 0.2 % (ref 0.0–0.2)

## 2020-10-13 LAB — POCT I-STAT 7, (LYTES, BLD GAS, ICA,H+H)
Acid-Base Excess: 0 mmol/L (ref 0.0–2.0)
Bicarbonate: 25.3 mmol/L (ref 20.0–28.0)
Calcium, Ion: 1.16 mmol/L (ref 1.15–1.40)
HCT: 27 % — ABNORMAL LOW (ref 36.0–46.0)
Hemoglobin: 9.2 g/dL — ABNORMAL LOW (ref 12.0–15.0)
O2 Saturation: 100 %
Potassium: 4.5 mmol/L (ref 3.5–5.1)
Sodium: 138 mmol/L (ref 135–145)
TCO2: 27 mmol/L (ref 22–32)
pCO2 arterial: 41.1 mmHg (ref 32.0–48.0)
pH, Arterial: 7.397 (ref 7.350–7.450)
pO2, Arterial: 435 mmHg — ABNORMAL HIGH (ref 83.0–108.0)

## 2020-10-13 LAB — I-STAT CHEM 8, ED
BUN: 20 mg/dL (ref 8–23)
Calcium, Ion: 1.11 mmol/L — ABNORMAL LOW (ref 1.15–1.40)
Chloride: 98 mmol/L (ref 98–111)
Creatinine, Ser: 5 mg/dL — ABNORMAL HIGH (ref 0.44–1.00)
Glucose, Bld: 119 mg/dL — ABNORMAL HIGH (ref 70–99)
HCT: 24 % — ABNORMAL LOW (ref 36.0–46.0)
Hemoglobin: 8.2 g/dL — ABNORMAL LOW (ref 12.0–15.0)
Potassium: 4.7 mmol/L (ref 3.5–5.1)
Sodium: 139 mmol/L (ref 135–145)
TCO2: 29 mmol/L (ref 22–32)

## 2020-10-13 LAB — COMPREHENSIVE METABOLIC PANEL
ALT: 10 U/L (ref 0–44)
ALT: 61 U/L — ABNORMAL HIGH (ref 0–44)
AST: 16 U/L (ref 15–41)
AST: 91 U/L — ABNORMAL HIGH (ref 15–41)
Albumin: 3.3 g/dL — ABNORMAL LOW (ref 3.5–5.0)
Albumin: 3 g/dL — ABNORMAL LOW (ref 3.5–5.0)
Alkaline Phosphatase: 52 U/L (ref 38–126)
Alkaline Phosphatase: 53 U/L (ref 38–126)
Anion gap: 12 (ref 5–15)
Anion gap: 18 — ABNORMAL HIGH (ref 5–15)
BUN: 21 mg/dL (ref 8–23)
BUN: 22 mg/dL (ref 8–23)
CO2: 28 mmol/L (ref 22–32)
CO2: 21 mmol/L — ABNORMAL LOW (ref 22–32)
Calcium: 8.9 mg/dL (ref 8.9–10.3)
Calcium: 9.2 mg/dL (ref 8.9–10.3)
Chloride: 99 mmol/L (ref 98–111)
Chloride: 101 mmol/L (ref 98–111)
Creatinine, Ser: 4.84 mg/dL — ABNORMAL HIGH (ref 0.44–1.00)
Creatinine, Ser: 5.26 mg/dL — ABNORMAL HIGH (ref 0.44–1.00)
GFR, Estimated: 9 mL/min — ABNORMAL LOW (ref >60)
GFR, Estimated: 8 mL/min — ABNORMAL LOW (ref >60)
Glucose, Bld: 118 mg/dL — ABNORMAL HIGH (ref 70–99)
Glucose, Bld: 217 mg/dL — ABNORMAL HIGH (ref 70–99)
Potassium: 4.4 mmol/L (ref 3.5–5.1)
Potassium: 4.7 mmol/L (ref 3.5–5.1)
Sodium: 139 mmol/L (ref 135–145)
Sodium: 140 mmol/L (ref 135–145)
Total Bilirubin: 0.3 mg/dL (ref 0.3–1.2)
Total Bilirubin: 0.8 mg/dL (ref 0.3–1.2)
Total Protein: 6.5 g/dL (ref 6.5–8.1)
Total Protein: 5.9 g/dL — ABNORMAL LOW (ref 6.5–8.1)

## 2020-10-13 LAB — TROPONIN I (HIGH SENSITIVITY): Troponin I (High Sensitivity): 59 ng/L — ABNORMAL HIGH (ref <18)

## 2020-10-13 LAB — GLUCOSE, CAPILLARY
Glucose-Capillary: 178 mg/dL — ABNORMAL HIGH (ref 70–99)
Glucose-Capillary: 222 mg/dL — ABNORMAL HIGH (ref 70–99)

## 2020-10-13 LAB — HEMOGLOBIN A1C
Hgb A1c MFr Bld: 6.2 % — ABNORMAL HIGH (ref 4.8–5.6)
Mean Plasma Glucose: 131.24 mg/dL

## 2020-10-13 LAB — MAGNESIUM: Magnesium: 2.4 mg/dL (ref 1.7–2.4)

## 2020-10-13 LAB — PROTIME-INR
INR: 1 (ref 0.8–1.2)
Prothrombin Time: 12.6 seconds (ref 11.4–15.2)

## 2020-10-13 LAB — LACTIC ACID, PLASMA: Lactic Acid, Venous: 9.5 mmol/L (ref 0.5–1.9)

## 2020-10-13 LAB — TSH: TSH: 2.776 u[IU]/mL (ref 0.350–4.500)

## 2020-10-13 LAB — PHOSPHORUS: Phosphorus: 6.7 mg/dL — ABNORMAL HIGH (ref 2.5–4.6)

## 2020-10-13 LAB — APTT: aPTT: 28 seconds (ref 24–36)

## 2020-10-13 MED ORDER — ALBUTEROL SULFATE HFA 108 (90 BASE) MCG/ACT IN AERS
2.0000 | INHALATION_SPRAY | Freq: Four times a day (QID) | RESPIRATORY_TRACT | Status: DC | PRN
  Filled 2020-10-13: qty 6.7

## 2020-10-13 MED ORDER — ALBUTEROL SULFATE (2.5 MG/3ML) 0.083% IN NEBU: 2.5000 mg | INHALATION_SOLUTION | Freq: Four times a day (QID) | RESPIRATORY_TRACT | Status: DC | PRN

## 2020-10-13 MED ORDER — MIDODRINE HCL 5 MG PO TABS: 5.0000 mg | ORAL_TABLET | ORAL | Status: DC

## 2020-10-13 MED ORDER — PROCHLORPERAZINE MALEATE 10 MG PO TABS
10.0000 mg | ORAL_TABLET | Freq: Four times a day (QID) | ORAL | Status: DC | PRN
  Filled 2020-10-13: qty 1

## 2020-10-13 MED ORDER — INSULIN ASPART 100 UNIT/ML ~~LOC~~ SOLN
0.0000 [IU] | Freq: Every day | SUBCUTANEOUS | Status: DC
  Administered 2020-10-14: 3 [IU] via SUBCUTANEOUS
  Filled 2020-10-13: qty 0.05

## 2020-10-13 MED ORDER — INSULIN DEGLUDEC 100 UNIT/ML ~~LOC~~ SOPN: 10.0000 [IU] | PEN_INJECTOR | Freq: Every day | SUBCUTANEOUS | Status: DC

## 2020-10-13 MED ORDER — ONDANSETRON HCL 4 MG/2ML IJ SOLN
4.0000 mg | Freq: Four times a day (QID) | INTRAMUSCULAR | Status: DC | PRN
  Administered 2020-10-13: 4 mg via INTRAVENOUS
  Filled 2020-10-13: qty 2

## 2020-10-13 MED ORDER — ROSUVASTATIN CALCIUM 20 MG PO TABS: 20.0000 mg | ORAL_TABLET | Freq: Every day | ORAL | Status: DC

## 2020-10-13 MED ORDER — MIDAZOLAM HCL 2 MG/2ML IJ SOLN
1.0000 mg | INTRAMUSCULAR | Status: AC | PRN
  Administered 2020-10-13 – 2020-10-15 (×3): 1 mg via INTRAVENOUS
  Filled 2020-10-13 (×3): qty 2

## 2020-10-13 MED ORDER — POLYETHYLENE GLYCOL 3350 17 G PO PACK: 17.0000 g | PACK | Freq: Every day | ORAL | Status: DC | PRN

## 2020-10-13 MED ORDER — CHLORHEXIDINE GLUCONATE CLOTH 2 % EX PADS
6.0000 | MEDICATED_PAD | Freq: Every day | CUTANEOUS | Status: DC
  Administered 2020-10-14 – 2020-10-15 (×2): 6 via TOPICAL

## 2020-10-13 MED ORDER — ALBUTEROL SULFATE (2.5 MG/3ML) 0.083% IN NEBU: 2.5000 mg | INHALATION_SOLUTION | Freq: Four times a day (QID) | RESPIRATORY_TRACT | Status: DC

## 2020-10-13 MED ORDER — INSULIN ASPART 100 UNIT/ML ~~LOC~~ SOLN
0.0000 [IU] | Freq: Three times a day (TID) | SUBCUTANEOUS | Status: DC
  Administered 2020-10-14: 3 [IU] via SUBCUTANEOUS
  Filled 2020-10-13: qty 0.09

## 2020-10-13 MED ORDER — INSULIN GLARGINE 100 UNIT/ML ~~LOC~~ SOLN
10.0000 [IU] | Freq: Every day | SUBCUTANEOUS | Status: DC
  Administered 2020-10-13: 10 [IU] via SUBCUTANEOUS
  Filled 2020-10-13 (×2): qty 0.1

## 2020-10-13 MED ORDER — SENNA 8.6 MG PO TABS: 1.0000 | ORAL_TABLET | Freq: Two times a day (BID) | ORAL | Status: DC

## 2020-10-13 MED ORDER — HYDRALAZINE HCL 20 MG/ML IJ SOLN
10.0000 mg | Freq: Four times a day (QID) | INTRAMUSCULAR | Status: DC | PRN
Start: 2020-10-13 — End: 2020-10-16

## 2020-10-13 MED ORDER — HEPARIN SODIUM (PORCINE) 5000 UNIT/ML IJ SOLN
5000.0000 [IU] | Freq: Three times a day (TID) | INTRAMUSCULAR | Status: DC
  Administered 2020-10-14 – 2020-10-17 (×10): 5000 [IU] via SUBCUTANEOUS
  Filled 2020-10-13 (×10): qty 1

## 2020-10-13 MED ORDER — MIDAZOLAM HCL 2 MG/2ML IJ SOLN
1.0000 mg | INTRAMUSCULAR | Status: DC | PRN
Start: 2020-10-13 — End: 2020-10-17
  Administered 2020-10-13 – 2020-10-15 (×5): 1 mg via INTRAVENOUS
  Filled 2020-10-13 (×6): qty 2

## 2020-10-13 MED ORDER — ACETAMINOPHEN 650 MG RE SUPP: 650.0000 mg | Freq: Four times a day (QID) | RECTAL | Status: DC | PRN

## 2020-10-13 MED ORDER — ONDANSETRON HCL 4 MG/2ML IJ SOLN
4.0000 mg | Freq: Once | INTRAMUSCULAR | Status: AC
  Administered 2020-10-13: 4 mg via INTRAVENOUS
  Filled 2020-10-13: qty 2

## 2020-10-13 MED ORDER — OXYCODONE HCL 5 MG PO TABS: 5.0000 mg | ORAL_TABLET | ORAL | Status: DC | PRN

## 2020-10-13 MED ORDER — POLYETHYLENE GLYCOL 3350 17 G PO PACK
17.0000 g | PACK | Freq: Every day | ORAL | Status: DC
  Administered 2020-10-15: 17 g
  Filled 2020-10-13 (×2): qty 1

## 2020-10-13 MED ORDER — DOCUSATE SODIUM 50 MG/5ML PO LIQD
100.0000 mg | Freq: Two times a day (BID) | ORAL | Status: DC
  Administered 2020-10-14 – 2020-10-16 (×4): 100 mg
  Filled 2020-10-13 (×5): qty 10

## 2020-10-13 MED ORDER — IOHEXOL 350 MG/ML SOLN
100.0000 mL | Freq: Once | INTRAVENOUS | Status: AC | PRN
  Administered 2020-10-13: 100 mL via INTRAVENOUS

## 2020-10-13 MED ORDER — FENTANYL CITRATE (PF) 100 MCG/2ML IJ SOLN
25.0000 ug | INTRAMUSCULAR | Status: DC | PRN
  Administered 2020-10-14 (×3): 100 ug via INTRAVENOUS
  Administered 2020-10-15 (×2): 25 ug via INTRAVENOUS
  Filled 2020-10-13 (×3): qty 2

## 2020-10-13 MED ORDER — SODIUM CHLORIDE 0.9% FLUSH: 10.0000 mL | INTRAVENOUS | Status: DC | PRN

## 2020-10-13 MED ORDER — FENTANYL CITRATE (PF) 100 MCG/2ML IJ SOLN
INTRAMUSCULAR | Status: AC
  Administered 2020-10-13: 100 ug via INTRAVENOUS
  Filled 2020-10-13: qty 2

## 2020-10-13 MED ORDER — FENTANYL CITRATE (PF) 100 MCG/2ML IJ SOLN: 25.0000 ug | INTRAMUSCULAR | Status: DC | PRN

## 2020-10-13 MED ORDER — GABAPENTIN 300 MG PO CAPS: 300.0000 mg | ORAL_CAPSULE | Freq: Three times a day (TID) | ORAL | Status: DC | PRN

## 2020-10-13 MED ORDER — NOREPINEPHRINE 4 MG/250ML-% IV SOLN
0.0000 ug/min | INTRAVENOUS | Status: DC
  Administered 2020-10-13: 2 ug/min via INTRAVENOUS
  Administered 2020-10-14: 10 ug/min via INTRAVENOUS
  Administered 2020-10-14 (×3): 30 ug/min via INTRAVENOUS
  Administered 2020-10-14: 25 ug/min via INTRAVENOUS
  Filled 2020-10-13 (×6): qty 250

## 2020-10-13 MED ORDER — ACETAMINOPHEN 325 MG PO TABS: 650.0000 mg | ORAL_TABLET | Freq: Four times a day (QID) | ORAL | Status: DC | PRN

## 2020-10-13 MED ORDER — PANTOPRAZOLE SODIUM 40 MG PO PACK
40.0000 mg | PACK | Freq: Every day | ORAL | Status: DC
  Administered 2020-10-14 – 2020-10-16 (×3): 40 mg
  Filled 2020-10-13 (×4): qty 20

## 2020-10-13 MED ORDER — SODIUM CHLORIDE 0.9% FLUSH
10.0000 mL | Freq: Two times a day (BID) | INTRAVENOUS | Status: DC
  Administered 2020-10-14 – 2020-10-16 (×7): 10 mL

## 2020-10-13 NOTE — Progress Notes (Signed)
eLink Physician-Brief Progress Note Patient Name: [name] DOB: August 01, 1950 MRN: [id number]   Date of Service  09/20/2020  HPI/Events of Note  Patient with recent diagnosis of colon cancer s/p initiation of chemotherapy, admitted by [facility] secondary to altered mental status, then went into V-fib arrest earlier tonight, and received 20 minutes of CPR and ACLS  protocol before ROSC, she was intubated and transferred to the ICU.  eICU Interventions  New Patient Evaluation completed.       [name] Charlayne Vultaggio 10/16/2020, 10:15 PM

## 2020-10-13 NOTE — Progress Notes (Signed)
Spoke with patient's sister  She had been updated earlier after the patient collapsed and CPR was started  Patient's son is coming into the hospital to visit  She stated we should continue lines of care until we discuss with the patient's son after which they will have a family discussion.  She will remain a full code at present  I did let her know that there is a chance of neurological injury with up to 15 minutes of resuscitation, factoring in patient's chronic health problems, she will have a challenging recovery court process.  Notably, she stated patient was feeling well prior to starting chemotherapy.  Continue aggressive support, update family as needed

## 2020-10-13 NOTE — H&P (Signed)
[facility] History and Physical  [name] TXM:[id number] DOB: May 20, 1951 DOA: 09/27/2020 PCP: [name], MD  Admitted from: home Chief Complaint: Disorientation, confusion  History of Present Illness: [name] is a 70 y.o. female with PMH significant for recently diagnosed colonic adenocarcinoma, no vomiting, started on chemotherapy yesterday 3/24, history of renal cell cancer, ESRD-HD-TTS, DM 2, HTN, HLD, chronic diastolic CHF, chronic anemia, depression. Patient received her first chemotherapy treatment yesterday 3/24 with FOLFOX with 5-FU pump.  She did well yesterday but according to patient's sister, patient seemed confused when she woke up this morning.  She was pulling off her colostomy bag not knowing how to manage it at all which she was able to do before she is globally confused and disoriented. Oncology team was called and patient was referred to ED.  For acute on the in the ED, patient is afebrile and hemodynamically stable.  She was able to move all extremities, no focal weakness.  But she would not answer questions appropriately, she is able to follow some commands but speaks the words that do not make any sense. Labs showed hemoglobin low at 9.2. Chest x-ray unremarkable. Telemetry neurology consultation was obtained.  Possible etiologies include acute ischemic stroke, seizure versus medication effect from recent chemotherapy. CT head and CT angio of head and neck was unremarkable.   Hospitalis service consulted for overnight observation and TIA work-up.  At the time of my evaluation, patient was actively throwing up in the ED.  She was having chills.  No fever.  Was confused.  Sister Ms. [name] was at bedside and helped with the history.  Review of Systems:  All systems were reviewed and were negative unless otherwise mentioned in the HPI  Past medical history: Past Medical History:  Diagnosis Date  . Anemia   . Anemia associated with chronic  renal failure   . Anemia in chronic kidney disease 09/29/2018  . Arthritis   . Asthma   . Blood transfusion without reported diagnosis    Phreesia 02/27/2020  . Cancer ([place])    Phreesia 02/27/2020  . Cataract    OD  . Chronic kidney disease    Phreesia 02/27/2020  . Coronary artery calcification seen on CAT scan 02/17/2018   Coronary calcification on CT  . Depression   . Diabetes mellitus without complication ([place])    Phreesia 02/27/2020  . Diabetic retinopathy of both eyes ([place])   . Diabetic ulcer of right foot associated with type 2 diabetes mellitus ([place])   . ESRD (end stage renal disease) on dialysis ([place])   . Essential hypertension 02/17/2018   Essential hypertension  . GERD (gastroesophageal reflux disease)    pt denies  . HLD (hyperlipidemia)   . Hypertension   . Hypertensive retinopathy    OU  . Left ventricular dysfunction 04/07/2018   Left ventricle dysfunction  . Microalbuminuria due to type 2 diabetes mellitus ([place])   . Neuromuscular disorder ([place])    diabetic neuropathy  . Nonproliferative retinopathy due to secondary diabetes ([place])   . Nonsustained ventricular tachycardia ([place]) 08/28/2018   Nonsustained ventricular tachycardia  . Pneumonia    hx of   . Renal cell cancer ([place])   . Renal insufficiency   . Right renal mass 03/10/2017  . Type 2 diabetes, controlled, with neuropathy ([place]) 06/22/2013  . Ulcer of other part of foot 06/22/2013  . 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 AMPUTATION TOE Right 10/29/2019   Procedure: RIGHT 2ND TOE AMPUTATION;  Surgeon: Edrick Kins, DPM;  Location: [facility];  Service: Podiatry;  Laterality: Right;  . AMPUTATION TOE Bilateral 09/04/2020   Procedure: RIGHT THIRD DIGIT AMPUTATION;  Surgeon: Edrick Kins, DPM;  Location: [place];  Service: Podiatry;  Laterality: Bilateral;  . BASCILIC VEIN TRANSPOSITION Left 08/08/2017    Procedure: BASILIC VEIN TRANSPOSITION FIRST STAGE LEFT ARM;  Surgeon: Serafina Mitchell, MD;  Location: [place];  Service: Vascular;  Laterality: Left;  . BASCILIC VEIN TRANSPOSITION Left 05/14/2018   Procedure: SECOND STAGE BASILIC VEIN TRANSPOSITION LEFT ARM;  Surgeon: Serafina Mitchell, MD;  Location: [place];  Service: Vascular;  Laterality: Left;  . BIOPSY  08/29/2020   Procedure: BIOPSY;  Surgeon: Yetta Flock, MD;  Location: [place];  Service: Gastroenterology;;  . CATARACT EXTRACTION Left   . COLONOSCOPY WITH PROPOFOL N/A 08/29/2020   Procedure: COLONOSCOPY WITH PROPOFOL;  Surgeon: Yetta Flock, MD;  Location: [place];  Service: Gastroenterology;  Laterality: N/A;  . EYE SURGERY    . ILEOSTOMY Right 08/30/2020   Procedure: ILEOSTOMY;  Surgeon: Jesusita Oka, MD;  Location: [place];  Service: General;  Laterality: Right;  . IR IMAGING GUIDED PORT INSERTION  10/04/2020  . PARTIAL COLECTOMY N/A 08/30/2020   Procedure: HEMI COLECTOMY;  Surgeon: Jesusita Oka, MD;  Location: [place];  Service: General;  Laterality: N/A;  . POLYPECTOMY  08/29/2020   Procedure: POLYPECTOMY;  Surgeon: Yetta Flock, MD;  Location: [facility] ENDOSCOPY;  Service: Gastroenterology;;  . ROBOTIC ADRENALECTOMY Left 03/10/2017   Procedure: XI ROBOTIC ADRENALECTOMY;  Surgeon: Nickie Retort, MD;  [facility];  Service: Urology;  Laterality: Left;  . ROBOTIC ASSITED PARTIAL NEPHRECTOMY Right 03/10/2017   Procedure: XI ROBOTIC ASSITED RADICAL NEPHRECTOMY;  Surgeon: Nickie Retort, MD;  [facility];  Service: Urology;  Laterality: Right;  . SUBMUCOSAL TATTOO INJECTION  08/29/2020   Procedure: SUBMUCOSAL TATTOO INJECTION;  Surgeon: Yetta Flock, MD;  Location: [facility] ENDOSCOPY;  Service: Gastroenterology;;  . TOE AMPUTATION Bilateral    both great toe ,, left foot 2nd toe 1/2  . TOE ARTHROPLASTY  09/04/2020   Procedure: ARTHROPLASTY OF THE THIRD DIGIT LEFT FOOT;  Surgeon: Edrick Kins,  DPM;  Location: [place] [facility];  Service: Podiatry;;    Social History:  reports that she has never smoked. She has never used smokeless tobacco. She reports current alcohol use. She reports that she does not use drugs.  Allergies:  Allergies  Allergen Reactions  . Latex Itching  . Penicillins Other (See Comments)    UNSPECIFIED CHILDHOOD REACTION  Has patient had a PCN reaction causing immediate rash, facial/tongue/throat swelling, SOB or lightheadedness with hypotension: Unknown Has patient had a PCN reaction causing severe rash involving mucus membranes or skin necrosis: Unknown Has patient had a PCN reaction that required hospitalization: Unknown Has patient had a PCN reaction occurring within the last 10 years: Unknown If all of the above answers are "NO", then may proceed with Cephalosporin use.   . Adhesive [Tape] Rash    Family history:  Family History  Problem Relation Age of Onset  . Heart disease Mother   . Diabetes Sister      Home Meds: Prior to Admission medications   Medication Sig Start Date End Date Taking? Authorizing Provider  doxercalciferol (HECTOROL) 4 MCG/2ML injection Inject 1.5 mLs (3 mcg total) into the vein Every  Tuesday,Thursday,and Saturday with dialysis. 09/07/20  Yes Lama, Marge Duncans, MD  insulin degludec (TRESIBA FLEXTOUCH) 100 UNIT/ML FlexTouch Pen Inject 20 Units into the skin daily. Hold if fasting blood sugars are <130. Patient taking differently: Inject 30 Units into the skin daily. Hold if fasting blood sugars are <130. 09/06/20  Yes Darrick Meigs, Marge Duncans, MD  midodrine (PROAMATINE) 5 MG tablet Take 1 tablet (5 mg total) by mouth Every Tuesday,Thursday,and Saturday with dialysis. 09/07/20  Yes Oswald Hillock, MD  multivitamin (RENA-VIT) TABS tablet Take 1 tablet by mouth daily.   Yes [provider]  rosuvastatin (CRESTOR) 20 MG tablet Take 1 tablet (20 mg total) by mouth daily. 11/23/19  Yes [name], MD  Blood Glucose Monitoring Suppl (ONE TOUCH  ULTRA 2) w/Device KIT Use to check BS BID-QID Dx:E11.9 05/19/17   [name], MD  BREO ELLIPTA 100-25 MCG/INH AEPB Inhale 1 puff by mouth once daily Patient not taking: No sig reported 08/09/20   [name], MD  Carboxymethylcellulose Sodium (THERATEARS OP) Place 1 drop into both eyes daily as needed (dry eyes).    [provider]  gabapentin (NEURONTIN) 300 MG capsule TAKE 1 CAPSULE BY MOUTH THREE TIMES DAILY. REQUIRES OFFICE VISIT BEFORE ANY FURTHER REFILLS CAN BE GIVEN Patient taking differently: Take 300 mg by mouth 3 (three) times daily as needed (nerve pain). 06/21/20   [name], MD  insulin aspart (NOVOLOG) 100 UNIT/ML injection Inject 0-6 Units into the skin 3 (three) times daily with meals. Sliding scale insulin Less than 70 initiate hypoglycemia protocol 70-120  0 units 120-150 0 unit 151-200 1 units 201-250 2 units 251-300 3 units 301-350 4 units 351-400 5 units Greater than 400 call MD and give 6 units Patient not taking: No sig reported 09/06/20   Oswald Hillock, MD  Insulin Pen Needle (LIVE BETTER PEN NEEDLES) 31G X 6 MM MISC To use with Tyler Aas pens daily 02/05/19   [name], MD  Lancets [facility] ULTRASOFT) lancets Use as instructed 05/19/17   [name], MD  lidocaine-prilocaine (EMLA) cream Apply 1 application topically as needed. Patient taking differently: Apply 1 application topically as needed (access port). 10/11/20   Lincoln Brigham, PA-C  ondansetron (ZOFRAN) 8 MG tablet Take 1 tablet (8 mg total) by mouth every 8 (eight) hours as needed for nausea or vomiting. 10/11/20   Lincoln Brigham, PA-C  ONETOUCH ULTRA test strip USE AS DIRECTED TO MONITOR  FSBS 3 TIMES DAILY 01/27/20   Annie Main, FNP  oxyCODONE (ROXICODONE) 5 MG immediate release tablet Take 1 tablet (5 mg total) by mouth every 8 (eight) hours as needed for up to 10 doses. Patient not taking: No sig reported 09/06/20   Oswald Hillock, MD  PROAIR HFA 108 [phone] Base) MCG/ACT  inhaler INHALE 2 PUFFS BY MOUTH EVERY 6 HOURS AS NEEDED FOR WHEEZING FOR SHORTNESS OF BREATH Patient taking differently: Inhale 2 puffs into the lungs every 6 (six) hours as needed for wheezing or shortness of breath. 08/09/20   [name], MD  prochlorperazine (COMPAZINE) 10 MG tablet Take 1 tablet (10 mg total) by mouth every 6 (six) hours as needed for nausea or vomiting. 10/11/20   Lincoln Brigham, PA-C  SANTYL ointment Apply 1 application topically daily as needed (wound care). 03/29/20   [provider]  traZODone (DESYREL) 50 MG tablet Take 0.5 tablets (25 mg total) by mouth at bedtime as needed for sleep. Patient  not taking: No sig reported 09/06/20   Oswald Hillock, MD    Physical Exam: Vitals:   09/25/2020 1531 10/06/2020 1545 10/11/2020 1730 09/28/2020 1800  BP:  140/71 (!) 147/79 134/67  Pulse:  75 84 79  Resp:  (!) _0 Temp: 97.8 F (36.6 C)     TempSrc: Oral     SpO2:  100% 100% 100%  Weight:      Height:       Wt Readings from Last 3 Encounters:  10/09/2020 87 kg  10/12/20 87.1 kg  10/04/20 90.3 kg   Body mass index is 30.04 kg/m.  General exam: Pleasant elderly Caucasian female.  Mild distress because of extreme nausea Skin: No rashes, lesions or ulcers. HEENT: Atraumatic, normocephalic, no obvious bleeding Lungs: Clear to auscultation bilaterally CVS: Regular rate and rhythm, no murmur GI/Abd soft, nontender, nondistended, bowel sound present CNS: Alert, awake, slow to respond, knows she is in the hospital.  Able to follow commands Psychiatry: Depressed look Extremities: No pedal edema, no calf tenderness     Consult Orders  (From admission, onward)         Start     Ordered   09/25/2020 1758  OT eval and treat  Routine       Question:  Reason for OT?  Answer:  CVA   09/25/2020 1757   10/10/2020 1758  PT eval and treat  Routine       Question:  Reason for PT?  Answer:  cva   10/12/2020 1757   10/09/2020 1758  SLP eval and treat Reason for evaluation:  .Swallowing evaluation (BSE, MBS and/or diet order as indicated), Cognitive/Language evaluation  Once       Question Answer Comment  Reason for evaluation .Swallowing evaluation (BSE, MBS and/or diet order as indicated)   Reason for evaluation Cognitive/Language evaluation      10/03/2020 1757   10/12/2020 1635  Consult to hospitalist  Once       Provider:  (Not yet assigned)  Question Answer Comment  Place call to: [facility]   Reason for Consult Admit      10/11/2020 1634          Labs on Admission:   CBC: Recent Labs  Lab 10/11/20 1506 10/14/2020 1347 10/04/2020 1403  WBC 6.6 7.7  --   NEUTROABS 4.6 5.8  --   HGB 9.9* 9.2* 8.2*  HCT 29.7* 27.9* 24.0*  MCV 99.3 100.0  --   PLT 162 148*  --     Basic Metabolic Panel: Recent Labs  Lab 10/11/20 1506 10/12/2020 1347 09/30/2020 1403  NA 144 139 139  K 4.6 4.4 4.7  CL 98 99 98  CO2 31 28  --   GLUCOSE 208* 118* 119*  BUN _1 CREATININE 5.82* 4.84* 5.00*  CALCIUM 9.2 8.9  --     Liver Function Tests: Recent Labs  Lab 10/11/20 1506 09/25/2020 1347  AST 15 16  ALT 11 10  ALKPHOS 66 52  BILITOT 0.4 0.3  PROT 7.2 6.5  ALBUMIN 3.3* 3.3*   No results for input(s): LIPASE, AMYLASE in the last 168 hours. Recent Labs  Lab 10/11/2020 1720  AMMONIA 31    Cardiac Enzymes: No results for input(s): CKTOTAL, CKMB, CKMBINDEX, TROPONINI in the last 168 hours.  BNP (last 3 results) No results for input(s): BNP in the last 8760 hours.  ProBNP (last 3 results) No results for input(s): PROBNP in the last  8760 hours.  CBG: Recent Labs  Lab 10/02/2020 1329  GLUCAP 102*    Lipase     Component Value Date/Time   LIPASE 33 08/27/2020 1350     Urinalysis    Component Value Date/Time   COLORURINE YELLOW 03/04/2017 1025   APPEARANCEUR CLEAR 03/04/2017 1025   LABSPEC 1.013 03/04/2017 1025   PHURINE 6.0 03/04/2017 1025   GLUCOSEU NEGATIVE 03/04/2017 1025   HGBUR SMALL (A) 03/04/2017 1025   BILIRUBINUR NEGATIVE  03/04/2017 1025   KETONESUR NEGATIVE 03/04/2017 1025   PROTEINUR 100 (A) 03/04/2017 1025   NITRITE NEGATIVE 03/04/2017 1025   LEUKOCYTESUR MODERATE (A) 03/04/2017 1025     Drugs of Abuse  No results found for: LABOPIA, COCAINSCRNUR, LABBENZ, AMPHETMU, THCU, LABBARB    Radiological Exams on Admission: DG Chest Port 1 View  Result Date: 10/18/2020 CLINICAL DATA:  Weakness.  Code stroke. EXAM: PORTABLE CHEST 1 VIEW COMPARISON:  11/28/2012 FINDINGS: Aortic atherosclerosis. There is a right chest wall port a catheter with tip in the right atrium. No pleural effusion or edema. No airspace densities. The visualized osseous structures are unremarkable. IMPRESSION: No acute cardiopulmonary abnormalities. Electronically Signed   By: Kerby Moors M.D.   On: 09/27/2020 14:19   CT HEAD CODE STROKE WO CONTRAST  Result Date: 09/20/2020 CLINICAL DATA:  Code stroke. Neuro deficit, acute, stroke suspected. Additional history provided: Confusion, last known well 10 p.m. EXAM: CT HEAD WITHOUT CONTRAST TECHNIQUE: Contiguous axial images were obtained from the base of the skull through the vertex without intravenous contrast. COMPARISON:  No pertinent prior exams available for comparison. FINDINGS: Brain: Mild cerebral and cerebellar atrophy. Small chronic appearing cortical infarct within the medial left occipital lobe (for instance as seen on series 3, image 12). Mild patchy and ill-defined hypoattenuation within the cerebral white matter is nonspecific, but compatible chronic small vessel ischemic disease. There is no acute intracranial hemorrhage. No demarcated cortical infarct. No extra-axial fluid collection. No evidence of intracranial mass. No midline shift. Vascular: No hyperdense vessel.  Atherosclerotic calcifications. Skull: Normal. Negative for fracture or focal lesion. Sinuses/Orbits: Visualized orbits show no acute finding. No significant paranasal sinus disease. ASPECTS ([place] Stroke Program Early CT  Score) - Ganglionic level infarction (caudate, lentiform nuclei, internal capsule, insula, M1-M3 cortex): 7 - Supraganglionic infarction (M4-M6 cortex): 3 Total score (0-10 with 10 being normal): 10 These results were called by telephone at the time of interpretation on 10/14/2020 at 1:47 pm to provider Dr, Leonel Ramsay, who verbally acknowledged these results. IMPRESSION: No evidence of acute intracranial abnormality.  ASPECTS is 10. 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 Mild generalized parenchymal atrophy and cerebral white matter chronic small vessel ischemic disease. Electronically Signed   By: Kellie Simmering DO   On: 10/18/2020 13:51   CT ANGIO HEAD NECK W WO CM W PERFUSION  Addendum Date: 09/22/2020   ADDENDUM REPORT: 10/14/2020 16:01 ADDENDUM: The CT perfusion was performed. However, the CT perfusion was stopped due to patient motion and restarted. The quality of the data is suspect. Based on rapid analysis, no evidence of acute infarct or ischemia. Electronically Signed   By: Franchot Gallo M.D.   On: 10/01/2020 16:01   Result Date: 09/27/2020 CLINICAL DATA:  Focal neuro deficit greater than 6 hours. Suspect stroke. Confusion EXAM: CT ANGIOGRAPHY HEAD AND NECK CT PERFUSION HEAD TECHNIQUE: Multidetector CT imaging of the head and neck was performed using the standard protocol during bolus administration of intravenous contrast. Multiplanar CT image reconstructions and MIPs  were obtained to evaluate the vascular anatomy. Carotid stenosis measurements (when applicable) are obtained utilizing NASCET criteria, using the distal internal carotid diameter as the denominator. CONTRAST:  155m OMNIPAQUE IOHEXOL 350 MG/ML SOLN COMPARISON:  CT head 10/03/2020 FINDINGS: CT HEAD FINDINGS Following the CT angiogram: Delayed imaging of the brain obtained. No enhancing lesion identified. Patchy white matter hypodensity again noted. Chronic infarct left medial occipital lobe unchanged and does not  enhance. CTA NECK FINDINGS Aortic arch: Standard branching. Imaged portion shows no evidence of aneurysm or dissection. No significant stenosis of the major arch vessel origins. Atherosclerotic calcification aortic arch and proximal great vessels. Right carotid system: Atherosclerotic calcification in the right common carotid artery without stenosis. Atherosclerotic calcification right carotid bifurcation without significant stenosis. Left carotid system: Atherosclerotic calcification left common carotid artery without stenosis. Atherosclerotic calcification left carotid bifurcation. Approximately 25% diameter stenosis left internal carotid artery. Vertebral arteries: Moderate calcific stenosis origin of right vertebral artery without additional stenosis and patent to the basilar. Mild calcific stenosis origin of left vertebral artery without additional stenosis. Skeleton: Disc and facet degeneration throughout the cervical and thoracic spine. No acute skeletal abnormality. Other neck: Subcentimeter thyroid nodules bilaterally. No further imaging necessary. Upper chest: Lung apices clear bilaterally. 22 mm right lower paratracheal lymph node. Review of the MIP images confirms the above findings CTA HEAD FINDINGS Anterior circulation: Atherosclerotic calcification without stenosis in the cavernous carotid bilaterally. Anterior middle cerebral arteries widely patent without large vessel occlusion. Posterior circulation: Posterior circulation widely patent without stenosis or large vessel occlusion. Venous sinuses: Normal venous enhancement. Anatomic variants: None Review of the MIP images confirms the above findings IMPRESSION: 1. Postcontrast imaging of the brain reveals no enhancing mass lesion. No acute infarct. 2. Atherosclerotic calcification aortic arch and proximal great vessels and carotid artery bilaterally. 3. Atherosclerotic calcification right carotid bifurcation without significant stenosis. Less than 25%  diameter stenosis proximal left internal carotid artery 4. Atherosclerotic calcification and stenosis at the origin of the vertebral artery bilaterally right greater than left. 5. No intracranial large vessel occlusion 6. 22 mm right lower paratracheal lymph node. 7. CT perfusion images pending at this time. Electronically Signed: By: CFranchot GalloM.D. On: 10/19/2020 15:18     ------------------------------------------------------------------------------------------------------ Assessment/Plan: Principal Problem:   TIA (transient ischemic attack) Active Problems:   Type 2 diabetes, controlled, with neuropathy (HCC)  Acute neurological symptoms -TIA versus seizure versus chemotherapy-induced PRES -Last known normal last night.  Woke up today with confusion, disorientation.  No focal neurological deficit.  Neurology consultation obtained in ED.  Initial CT scan of head and CT angio of head unremarkable. -We will keep in observation for stroke work-up.  Ordered for MRI brain, echocardiogram, A1c, lipid panel, ammonia, TSH, PT OT eval.  Need to rule out seizure with EEG. -Neurology follow-up. -Home meds include Crestor 20 mg daily, on home  Nausea, vomiting -Probably related to chemotherapy. -Symptomatic management with IV Zofran at this time. -She is a dialysis patient and is at risk of volume overload but because of active vomiting, she may get Hypro will limit soon.  Continue to monitor.  Recently diagnosed colon cancer on chemotherapy -Started on chemotherapy yesterday on 3/24. -Has a colostomy bag in place. -Follows up with oncologist Dr. DLorenso Courieras an outpatient.  ESRD on HD-TTS - Next dialysis day tomorrow.  Unclear what time of the day as an outpatient.  I texted nephrology on call.  Type II diabetes mellitus -A1c 7.8 on 08/28/2020.   -Home meds include  Tresiba 30 units daily.  Because of inadequate intake at this time, I would order for 10 units only for tonight.  Sliding scale  insulin with Accu-Cheks -On Neurontin for peripheral neuropathy Recent Labs  Lab 09/20/2020 1329  GLUCAP 102*   Chronic diastolic CHF Chronic hypotension -Home meds include midodrine 5 mg on dialysis days.  Continue the same.  Hyperlipidemia -Statin.  Chronic anemia -no active bleeding. Recent Labs    09/22/20 1536 10/02/20 1335 10/04/20 1039 10/11/20 1506 10/01/2020 1347 10/06/2020 1403  HGB  --  10.5* 9.7* 9.9* 9.2* 8.2*  MCV  --  103.8* 103.7* 99.3 100.0  --   FERRITIN 908*  --   --   --   --   --   TIBC 199*  --   --   --   --   --   IRON 34*  --   --   --   --   --   RETICCTPCT 3.4*  --   --   --   --   --    Mobility: Encourage ambulation Code Status:   Code Status: Prior patient was DNR/DNI in her recent admission in February.  Per patient's sister at bedside, patient wants to rescind it.  I order for full code for now. DVT prophylaxis:  Heparin subcu Antimicrobials:  None Fluid: None  Diet:  Diet Order            Diet clear liquid Room service appropriate? Yes; Fluid consistency: Thin  Diet effective now               Clear liquid diet if able to  Consultants: Nephrology, neurology Family Communication:  Sister at bedside  Dispo: The patient is from: Home              Anticipated d/c is to: Home most likely              Anticipated d/c date is: 1 day  ------------------------------------------------------------------------------------- Severity of Illness: The appropriate patient status for this patient is OBSERVATION. Observation status is judged to be reasonable and necessary in order to provide the required intensity of service to ensure the patient's safety. The patient's presenting symptoms, physical exam findings, and initial radiographic and laboratory data in the context of their medical condition is felt to place them at decreased risk for further clinical deterioration. Furthermore, it is anticipated that the patient will be medically stable for  discharge from the hospital within 2 midnights of admission. The following factors support the patient status of observation.   " The patient's presenting symptoms include disorientation, nausea, vomiting. " The physical exam findings include confusion, vomiting. " The initial radiographic and laboratory data are nonconclusive  -------------------------------------------------------------------------------------  Signed, Terrilee Croak, MD [facility] 09/22/2020

## 2020-10-13 NOTE — Consult Note (Addendum)
NAME:  [name], MRN:  [id number], DOB:  [birth year], LOS: 0 ADMISSION DATE:  09/29/2020, CONSULTATION DATE:  09/19/2020 REFERRING MD:  Dr. Pietro Cassis TRH, CHIEF COMPLAINT:  Cardiac arrest   History of Present Illness:  70 year old female with PMH as below, which is significant for ESRD on HD, CAD, DM, and HTN. 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 She is status post hemicolectomy and was just started on palliative chemotherapy 3/24 with FOLFOX and 5-FU. She woke up 3/25 with some confusion, which persisted and worsened throughout the day causing her to present to [facility] ED. In the ED, her speech did not make sense and there was concern for acute stroke. Neuroimaging was unremarkable. Neurology consultation was obtained and further studies were ordered. Etiology remained uncertain. She was admitted to the hospitalist service with neuro workup ongoing and with supportive care for her chronic medical issues. At approximately 2130 she suffered VF cardiac arrest and code blue was called. She underwent approximately 15 minutes of ACLS with eventual ROSC. See code sheet for detailed report, but she did receive defibrillation, amiodarone, epinephrine, magnesium, and sodium bicarb in the midst of resuscitation. Post-code she was transferred to ICU for further care.   Pertinent  Medical History   has a past medical history of Anemia, Anemia associated with chronic renal failure, Anemia in chronic kidney disease (09/29/2018), Arthritis, Asthma, Blood transfusion without reported diagnosis, Cancer ([place]), Cataract, Chronic kidney disease, Coronary artery calcification seen on CAT scan (02/17/2018), Depression, Diabetes mellitus without complication ([place]), Diabetic retinopathy of both eyes ([place]), Diabetic ulcer of right foot associated with type 2 diabetes mellitus ([place]), ESRD (end stage renal disease) on dialysis [facility]), Essential  hypertension (02/17/2018), GERD (gastroesophageal reflux disease), HLD (hyperlipidemia), Hypertension, Hypertensive retinopathy, Left ventricular dysfunction (04/07/2018), Microalbuminuria due to type 2 diabetes mellitus ([place]), Neuromuscular disorder ([place]), Nonproliferative retinopathy due to secondary diabetes ([place]), Nonsustained ventricular tachycardia ([place]) (08/28/2018), Pneumonia, Renal cell cancer ([place]), Renal insufficiency, Right renal mass (03/10/2017), Type 2 diabetes, controlled, with neuropathy ([place]) (06/22/2013), Ulcer of other part of foot (06/22/2013), and 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   Significant Hospital Events: Including procedures, antibiotic start and stop dates in addition to other pertinent events   . Admit for AMS with neuro workup ongoing, suffered VF arrest 15 minutes.   Interim History / Subjective:    Objective   Blood pressure 132/61, pulse 78, temperature 97.8 F (36.6 C), temperature source Oral, resp. rate 18, height 5' 7"  (1.702 m), weight 87 kg, SpO2 100 %.       No intake or output data in the 24 hours ending 10/18/2020 2203 Filed Weights   10/12/2020 1355  Weight: 87 kg    Examination: General: obese elderly female in NAD HENT: [place]/AT, PERRL, no JVD Lungs: Clear, bilateral breath sounds Abdomen: Soft, non-distended. Stoma beefy red.  Extremities: No acute deformity. No edema.  Neuro: Coma  Labs/imaging that I have personally reviewed  (right click and "Reselect all SmartList Selections" daily)  Serum creatinine 4.8, hgb 9.2,  CT head: unremarkable CTA head: no enhancing lesions, atherosclerotic calcification in multiple vessels, no significant occlusion.  ECG post code with QTC 318  Resolved Hospital Problem list     Assessment & Plan:   VF cardiac arrest: etiology not totally clear. VF possibly from prolonged QTC but cannot rule out other causes at this time. Downtime 15 mins. (she  is beginning to wake up post code and  is following commands) - Transfer to ICU - trend CEs - Echocardiogram pending - Holding off on heparin at this time  Acute hypoxemic respiratory failure secondary to cardiac arrest - Full vent support - CXR and ABG reviewed - SBT in AM, suspect she will be able to extubate - PRN sedation for RASS goal -1 - Peripheral dose NE so she can tolerate PAD meds  Acute encephalopathy: this is the original reason for admission and proceeded cardiac arrest. Neuroimaging unremarkable. Spot EEG without sz. CVA and medication effect considered. Neurology raises concers of PRES from either decadron or bevacizumab. Ammiona/TSH wnl.  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 S/p hemicolectomy in Feb. Currently undergoing palliative chemo with FOLFOX and 5-FU.  - 5-FU home infusion pump stopped as this may cause arrhythmia and QT prolongation. - This was her first treatment.   Prolonged QTC: - D/c zofran and compazine from MAR - DC midodrine - DC 5-fu - Given amio during code, will not start infusion  ESRD on HD: TTS - nephrology has been contacted by admitting team per H&P note.  - Trend BMP  DM 2: - CBG monitoring, SSI, lantus  Chronic hypotension - dc midodrine as above   Best practice (right click and "Reselect all SmartList Selections" daily)  Diet:  NPO Pain/Anxiety/Delirium protocol (if indicated): Yes (RASS goal -1) VAP protocol (if indicated): Yes DVT prophylaxis: Subcutaneous Heparin GI prophylaxis: PPI Glucose control:  SSI Yes and Basal insulin Yes Central venous access:  Yes, and it is still needed Arterial line:  N/A Foley:  N/A Mobility:  bed rest  PT consulted: N/A Last date of multidisciplinary goals of care discussion [3/25] Code Status:  full code: Has been DNR in the past, and if she does not improve quickly I get the feeling family would not want prolonged life support.  Disposition: ICU  Labs    CBC: Recent Labs  Lab 10/11/20 1506 10/09/2020 1347 10/05/2020 1403  WBC 6.6 7.7  --   NEUTROABS 4.6 5.8  --   HGB 9.9* 9.2* 8.2*  HCT 29.7* 27.9* 24.0*  MCV 99.3 100.0  --   PLT 162 148*  --     Basic Metabolic Panel: Recent Labs  Lab 10/11/20 1506 10/12/2020 1347 10/12/2020 1403  NA 144 139 139  K 4.6 4.4 4.7  CL 98 99 98  CO2 31 28  --   GLUCOSE 208* 118* 119*  BUN 18 21 20   CREATININE 5.82* 4.84* 5.00*  CALCIUM 9.2 8.9  --    GFR: Estimated Creatinine Clearance: 11.9 mL/min (A) (by C-G formula based on SCr of 5 mg/dL (H)). Recent Labs  Lab 10/11/20 1506 09/27/2020 1347  WBC 6.6 7.7    Liver Function Tests: Recent Labs  Lab 10/11/20 1506 09/22/2020 1347  AST 15 16  ALT 11 10  ALKPHOS 66 52  BILITOT 0.4 0.3  PROT 7.2 6.5  ALBUMIN 3.3* 3.3*   No results for input(s): LIPASE, AMYLASE in the last 168 hours. Recent Labs  Lab 09/22/2020 1720  AMMONIA 31    ABG    Component Value Date/Time   TCO2 29 10/05/2020 1403     Coagulation Profile: Recent Labs  Lab 10/09/2020 1347  INR 1.0    Cardiac Enzymes: No results for input(s): CKTOTAL, CKMB, CKMBINDEX, TROPONINI in the last 168 hours.  HbA1C: Hgb A1c MFr Bld  Date/Time Value  Ref Range Status  10/05/2020 01:47 PM 6.2 (H) 4.8 - 5.6 % Final    Comment:    (NOTE) Pre diabetes:          5.7%-6.4%  Diabetes:              >6.4%  Glycemic control for   <7.0% adults with diabetes   08/28/2020 12:02 AM 7.8 (H) 4.8 - 5.6 % Final    Comment:    (NOTE) Pre diabetes:          5.7%-6.4%  Diabetes:              >6.4%  Glycemic control for   <7.0% adults with diabetes     CBG: Recent Labs  Lab 10/06/2020 1329 10/12/2020 2051  GLUCAP 102* 178*    Review of Systems:   Patient is encephalopathic and/or intubated. Therefore history has been obtained from chart review.   Past Medical History:  She,  has a past medical history of Anemia, Anemia associated with chronic renal failure, Anemia in chronic  kidney disease (09/29/2018), Arthritis, Asthma, Blood transfusion without reported diagnosis, Cancer ([place]), Cataract, Chronic kidney disease, Coronary artery calcification seen on CAT scan (02/17/2018), Depression, Diabetes mellitus without complication ([place]), Diabetic retinopathy of both eyes ([place]), Diabetic ulcer of right foot associated with type 2 diabetes mellitus ([place]), ESRD (end stage renal disease) on dialysis [facility]), Essential hypertension (02/17/2018), GERD (gastroesophageal reflux disease), HLD (hyperlipidemia), Hypertension, Hypertensive retinopathy, Left ventricular dysfunction (04/07/2018), Microalbuminuria due to type 2 diabetes mellitus ([place]), Neuromuscular disorder ([place]), Nonproliferative retinopathy due to secondary diabetes ([place]), Nonsustained ventricular tachycardia ([place]) (08/28/2018), Pneumonia, Renal cell cancer ([place]), Renal insufficiency, Right renal mass (03/10/2017), Type 2 diabetes, controlled, with neuropathy ([place]) (06/22/2013), Ulcer of other part of foot (06/22/2013), and 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   Surgical History:   Past Surgical History:  Procedure Laterality Date  . AMPUTATION TOE Right 10/29/2019   Procedure: RIGHT 2ND TOE AMPUTATION;  Surgeon: Edrick Kins, DPM;  Location: [facility];  Service: Podiatry;  Laterality: Right;  . AMPUTATION TOE Bilateral 09/04/2020   Procedure: RIGHT THIRD DIGIT AMPUTATION;  Surgeon: Edrick Kins, DPM;  Location: [place];  Service: Podiatry;  Laterality: Bilateral;  . BASCILIC VEIN TRANSPOSITION Left 08/08/2017   Procedure: BASILIC VEIN TRANSPOSITION FIRST STAGE LEFT ARM;  Surgeon: Serafina Mitchell, MD;  Location: [place];  Service: Vascular;  Laterality: Left;  . BASCILIC VEIN TRANSPOSITION Left 05/14/2018   Procedure: SECOND STAGE BASILIC VEIN TRANSPOSITION LEFT ARM;  Surgeon: Serafina Mitchell, MD;  Location: [place];  Service: Vascular;  Laterality: Left;  . BIOPSY  08/29/2020   Procedure: BIOPSY;  Surgeon:  Yetta Flock, MD;  Location: [place];  Service: Gastroenterology;;  . CATARACT EXTRACTION Left   . COLONOSCOPY WITH PROPOFOL N/A 08/29/2020   Procedure: COLONOSCOPY WITH PROPOFOL;  Surgeon: Yetta Flock, MD;  Location: [place];  Service: Gastroenterology;  Laterality: N/A;  . EYE SURGERY    . ILEOSTOMY Right 08/30/2020   Procedure: ILEOSTOMY;  Surgeon: Jesusita Oka, MD;  Location: [place];  Service: General;  Laterality: Right;  . IR IMAGING GUIDED PORT INSERTION  10/04/2020  . PARTIAL COLECTOMY N/A 08/30/2020   Procedure: HEMI COLECTOMY;  Surgeon: Jesusita Oka, MD;  Location: [place];  Service: General;  Laterality: N/A;  . POLYPECTOMY  08/29/2020   Procedure: POLYPECTOMY;  Surgeon: Yetta Flock, MD;  Location: [place];  Service: Gastroenterology;;  . ROBOTIC ADRENALECTOMY Left  03/10/2017   Procedure: XI ROBOTIC ADRENALECTOMY;  Surgeon: Nickie Retort, MD;  Location: [facility];  Service: Urology;  Laterality: Left;  . ROBOTIC ASSITED PARTIAL NEPHRECTOMY Right 03/10/2017   Procedure: XI ROBOTIC ASSITED RADICAL NEPHRECTOMY;  Surgeon: Nickie Retort, MD;  [facility]: [facility];  Service: Urology;  Laterality: Right;  . SUBMUCOSAL TATTOO INJECTION  08/29/2020   Procedure: SUBMUCOSAL TATTOO INJECTION;  Surgeon: Yetta Flock, MD;  Location: [facility];  Service: Gastroenterology;;  . TOE AMPUTATION Bilateral    both great toe ,, left foot 2nd toe 1/2  . TOE ARTHROPLASTY  09/04/2020   Procedure: ARTHROPLASTY OF THE THIRD DIGIT LEFT FOOT;  Surgeon: Edrick Kins, DPM;  Location: [place] [facility];  Service: Podiatry;;     Social History:   reports that she has never smoked. She has never used smokeless tobacco. She reports current alcohol use. She reports that she does not use drugs.   Family History:  Her family history includes Diabetes in her sister; Heart disease in her mother.   Allergies Allergies  Allergen Reactions  . Latex Itching  . Penicillins Other  (See Comments)    UNSPECIFIED CHILDHOOD REACTION  Has patient had a PCN reaction causing immediate rash, facial/tongue/throat swelling, SOB or lightheadedness with hypotension: Unknown Has patient had a PCN reaction causing severe rash involving mucus membranes or skin necrosis: Unknown Has patient had a PCN reaction that required hospitalization: Unknown Has patient had a PCN reaction occurring within the last 10 years: Unknown If all of the above answers are "NO", then may proceed with Cephalosporin use.   . Adhesive [Tape] Rash     Home Medications  Prior to Admission medications   Medication Sig Start Date End Date Taking? Authorizing Provider  doxercalciferol (HECTOROL) 4 MCG/2ML injection Inject 1.5 mLs (3 mcg total) into the vein Every Tuesday,Thursday,and Saturday with dialysis. 09/07/20  Yes Lama, Marge Duncans, MD  insulin degludec (TRESIBA FLEXTOUCH) 100 UNIT/ML FlexTouch Pen Inject 20 Units into the skin daily. Hold if fasting blood sugars are <130. Patient taking differently: Inject 30 Units into the skin daily. Hold if fasting blood sugars are <130. 09/06/20  Yes Darrick Meigs, Marge Duncans, MD  midodrine (PROAMATINE) 5 MG tablet Take 1 tablet (5 mg total) by mouth Every Tuesday,Thursday,and Saturday with dialysis. 09/07/20  Yes Oswald Hillock, MD  multivitamin (RENA-VIT) TABS tablet Take 1 tablet by mouth daily.   Yes [provider]  rosuvastatin (CRESTOR) 20 MG tablet Take 1 tablet (20 mg total) by mouth daily. 11/23/19  Yes [name], MD  Blood Glucose Monitoring Suppl (ONE TOUCH ULTRA 2) w/Device KIT Use to check BS BID-QID Dx:E11.9 05/19/17   [name], MD  BREO ELLIPTA 100-25 MCG/INH AEPB Inhale 1 puff by mouth once daily Patient not taking: No sig reported 08/09/20   [name], MD  Carboxymethylcellulose Sodium (THERATEARS OP) Place 1 drop into both eyes daily as needed (dry eyes).    [provider]  gabapentin (NEURONTIN) 300 MG capsule TAKE 1 CAPSULE BY  MOUTH THREE TIMES DAILY. REQUIRES OFFICE VISIT BEFORE ANY FURTHER REFILLS CAN BE GIVEN Patient taking differently: Take 300 mg by mouth 3 (three) times daily as needed (nerve pain). 06/21/20   [name], MD  insulin aspart (NOVOLOG) 100 UNIT/ML injection Inject 0-6 Units into the skin 3 (three) times daily with meals. Sliding scale insulin Less than 70 initiate hypoglycemia protocol 70-120  0 units 120-150 0 unit 151-200 1 units 201-250 2  units 251-300 3 units 301-350 4 units 351-400 5 units Greater than 400 call MD and give 6 units Patient not taking: No sig reported 09/06/20   Oswald Hillock, MD  Insulin Pen Needle (LIVE BETTER PEN NEEDLES) 31G X 6 MM MISC To use with Tresiba pens daily 02/05/19   [name], MD  Lancets [facility] ULTRASOFT) lancets Use as instructed 05/19/17   [name], MD  lidocaine-prilocaine (EMLA) cream Apply 1 application topically as needed. Patient taking differently: Apply 1 application topically as needed (access port). 10/11/20   Lincoln Brigham, PA-C  ondansetron (ZOFRAN) 8 MG tablet Take 1 tablet (8 mg total) by mouth every 8 (eight) hours as needed for nausea or vomiting. 10/11/20   Lincoln Brigham, PA-C  ONETOUCH ULTRA test strip USE AS DIRECTED TO MONITOR  FSBS 3 TIMES DAILY 01/27/20   Annie Main, FNP  oxyCODONE (ROXICODONE) 5 MG immediate release tablet Take 1 tablet (5 mg total) by mouth every 8 (eight) hours as needed for up to 10 doses. Patient not taking: No sig reported 09/06/20   Oswald Hillock, MD  PROAIR HFA 108 [phone] Base) MCG/ACT inhaler INHALE 2 PUFFS BY MOUTH EVERY 6 HOURS AS NEEDED FOR WHEEZING FOR SHORTNESS OF BREATH Patient taking differently: Inhale 2 puffs into the lungs every 6 (six) hours as needed for wheezing or shortness of breath. 08/09/20   [name], MD  prochlorperazine (COMPAZINE) 10 MG tablet Take 1 tablet (10 mg total) by mouth every 6 (six) hours as needed for nausea or vomiting. 10/11/20   Lincoln Brigham, PA-C   SANTYL ointment Apply 1 application topically daily as needed (wound care). 03/29/20   [provider]  traZODone (DESYREL) 50 MG tablet Take 0.5 tablets (25 mg total) by mouth at bedtime as needed for sleep. Patient not taking: No sig reported 09/06/20   Oswald Hillock, MD     Critical care time: 59 min     Georgann Housekeeper, AGACNP-BC [place] for personal pager PCCM on call pager [phone] until 7pm. Please call Elink 7p-7a. [phone]  10/03/2020 10:50 PM

## 2020-10-13 NOTE — ED Notes (Signed)
Pt back from CT

## 2020-10-13 NOTE — Code Documentation (Signed)
Paged for code blue assisted with ACLS protocols and CPR until ROSC was obtained and then transported patient to ICU

## 2020-10-13 NOTE — ED Notes (Signed)
EEG at bedside.

## 2020-10-13 NOTE — ED Provider Notes (Signed)
[place] DEPT Provider Note   CSN: [id number] Arrival date & time: 10/08/2020  1322     History Chief Complaint  Patient presents with  . Neurologic Problem    [name] is a 70 y.o. female.  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  She was normal last night but when she woke up this morning she seemed confused according to her sister  The history is provided by the patient. No language interpreter was used.  Neurologic Problem This is a new problem. The current episode started 12 to 24 hours ago. The problem occurs constantly. The problem has not changed since onset.Pertinent negatives include no chest pain. Nothing aggravates the symptoms. Nothing relieves the symptoms.       Past Medical History:  Diagnosis Date  . Anemia   . Anemia associated with chronic renal failure   . Anemia in chronic kidney disease 09/29/2018  . Arthritis   . Asthma   . Blood transfusion without reported diagnosis    Phreesia 02/27/2020  . Cancer ([place])    Phreesia 02/27/2020  . Cataract    OD  . Chronic kidney disease    Phreesia 02/27/2020  . Coronary artery calcification seen on CAT scan 02/17/2018   Coronary calcification on CT  . Depression   . Diabetes mellitus without complication ([place])    Phreesia 02/27/2020  . Diabetic retinopathy of both eyes ([place])   . Diabetic ulcer of right foot associated with type 2 diabetes mellitus ([place])   . ESRD (end stage renal disease) on dialysis ([place])   . Essential hypertension 02/17/2018   Essential hypertension  . GERD (gastroesophageal reflux disease)    pt denies  . HLD (hyperlipidemia)   . Hypertension   . Hypertensive retinopathy    OU  . Left ventricular dysfunction 04/07/2018   Left ventricle dysfunction  . Microalbuminuria due to type 2 diabetes mellitus ([place])   . Neuromuscular disorder ([place])    diabetic neuropathy  . Nonproliferative retinopathy due to secondary diabetes ([place])   .  Nonsustained ventricular tachycardia ([place]) 08/28/2018   Nonsustained ventricular tachycardia  . Pneumonia    hx of   . Renal cell cancer ([place])   . Renal insufficiency   . Right renal mass 03/10/2017  . Type 2 diabetes, controlled, with neuropathy ([place]) 06/22/2013  . Ulcer of other part of foot 06/22/2013  . 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 [facility])     Patient Active Problem List   Diagnosis Date Noted  . Encounter for antineoplastic chemotherapy 10/12/2020  . Metastatic colon cancer in female [facility]) 09/24/2020  . Malignant neoplasm of ascending colon ([place])   . Benign neoplasm of colon   . Cellulitis in diabetic foot ([place]) 08/28/2020  . Chronic ulcer of toe of right foot ([place])   . Colonic mass   . ESRD on hemodialysis ([place])   . Bowel obstruction ([place]) 08/27/2020  . Dysuria 05/16/2020  . Allergy, unspecified, initial encounter 03/30/2020  . Anaphylactic shock, unspecified, initial encounter 03/30/2020  . Unspecified protein-calorie malnutrition ([place]) 07/02/2019  . Underimmunization status 06/08/2019  . Body mass index (BMI) 40.0-44.9, adult ([place]) 05/26/2019  . Coagulation defect, unspecified ([place]) 05/26/2019  . Dependence on renal dialysis ([place]) 05/26/2019  . Diarrhea, unspecified 05/26/2019  . End stage renal disease ([place]) 05/26/2019  . Heart failure, unspecified ([place]) 05/26/2019  . Hypertensive chronic kidney disease with stage 1 through stage 4 chronic kidney disease, or unspecified chronic kidney disease 05/26/2019  .  Iron deficiency anemia, unspecified 05/26/2019  . Malignant neoplasm of unspecified kidney, except renal pelvis ([place]) 05/26/2019  . Pain, unspecified 05/26/2019  . Peripheral vascular disease ([place]) 05/26/2019  . Polyneuropathy, unspecified 05/26/2019  . Pruritus, unspecified 05/26/2019  . Secondary hyperparathyroidism of renal origin ([place]) 05/26/2019  . Shortness of breath 05/26/2019  . Unspecified asthma, uncomplicated 51/70/0174  . Disorder  of kidney and ureter, unspecified 05/26/2019  . Anemia in chronic kidney disease 09/29/2018  . Nonsustained ventricular tachycardia ([place]) 08/28/2018  . Nonproliferative retinopathy due to secondary diabetes ([place])   . Left ventricular dysfunction 04/07/2018  . Essential hypertension 02/17/2018  . Coronary artery calcification seen on CAT scan 02/17/2018  . Diabetic retinopathy of both eyes ([place])   . Hyperlipidemia 10/01/2017  . Encounter for long-term (current) use of medications 10/01/2017  . Right renal mass 03/10/2017  . Diabetic ulcer of right foot associated with type 2 diabetes mellitus ([place])   . Type 2 diabetes, controlled, with neuropathy ([place]) 06/22/2013  . Ulcer of other part of foot 06/22/2013    Past Surgical History:  Procedure Laterality Date  . AMPUTATION TOE Right 10/29/2019   Procedure: RIGHT 2ND TOE AMPUTATION;  Surgeon: Edrick Kins, DPM;  Location: [facility];  Service: Podiatry;  Laterality: Right;  . AMPUTATION TOE Bilateral 09/04/2020   Procedure: RIGHT THIRD DIGIT AMPUTATION;  Surgeon: Edrick Kins, DPM;  Location: [place];  Service: Podiatry;  Laterality: Bilateral;  . BASCILIC VEIN TRANSPOSITION Left 08/08/2017   Procedure: BASILIC VEIN TRANSPOSITION FIRST STAGE LEFT ARM;  Surgeon: Serafina Mitchell, MD;  Location: [place];  Service: Vascular;  Laterality: Left;  . BASCILIC VEIN TRANSPOSITION Left 05/14/2018   Procedure: SECOND STAGE BASILIC VEIN TRANSPOSITION LEFT ARM;  Surgeon: Serafina Mitchell, MD;  Location: [place];  Service: Vascular;  Laterality: Left;  . BIOPSY  08/29/2020   Procedure: BIOPSY;  Surgeon: Yetta Flock, MD;  Location: [place];  Service: Gastroenterology;;  . CATARACT EXTRACTION Left   . COLONOSCOPY WITH PROPOFOL N/A 08/29/2020   Procedure: COLONOSCOPY WITH PROPOFOL;  Surgeon: Yetta Flock, MD;  Location: [place];  Service: Gastroenterology;  Laterality: N/A;  . EYE SURGERY    . ILEOSTOMY Right 08/30/2020   Procedure: ILEOSTOMY;   Surgeon: Jesusita Oka, MD;  Location: [place];  Service: General;  Laterality: Right;  . IR IMAGING GUIDED PORT INSERTION  10/04/2020  . PARTIAL COLECTOMY N/A 08/30/2020   Procedure: HEMI COLECTOMY;  Surgeon: Jesusita Oka, MD;  Location: [place];  Service: General;  Laterality: N/A;  . POLYPECTOMY  08/29/2020   Procedure: POLYPECTOMY;  Surgeon: Yetta Flock, MD;  Location: [facility]COPY;  Service: Gastroenterology;;  . ROBOTIC ADRENALECTOMY Left 03/10/2017   Procedure: XI ROBOTIC ADRENALECTOMY;  Surgeon: Nickie Retort, MD;  [facility];  Service: Urology;  Laterality: Left;  . ROBOTIC ASSITED PARTIAL NEPHRECTOMY Right 03/10/2017   Procedure: XI ROBOTIC ASSITED RADICAL NEPHRECTOMY;  Surgeon: Nickie Retort, MD;  [facility];  Service: Urology;  Laterality: Right;  . SUBMUCOSAL TATTOO INJECTION  08/29/2020   Procedure: SUBMUCOSAL TATTOO INJECTION;  Surgeon: Yetta Flock, MD;  Location: [facility];  Service: Gastroenterology;;  . TOE AMPUTATION Bilateral    both great toe ,, left foot 2nd toe 1/2  . TOE ARTHROPLASTY  09/04/2020   Procedure: ARTHROPLASTY OF THE THIRD DIGIT LEFT FOOT;  Surgeon: Edrick Kins, DPM;  Location: [place] [facility];  Service: Podiatry;;     OB History  No obstetric history on file.     Family History  Problem Relation Age of Onset  . Heart disease Mother   . Diabetes Sister     Social History   Tobacco Use  . Smoking status: Never Smoker  . Smokeless tobacco: Never Used  Vaping Use  . Vaping Use: Never used  Substance Use Topics  . Alcohol use: Yes    Alcohol/week: 0.0 standard drinks    Comment: occ  . Drug use: No    Home Medications Prior to Admission medications   Medication Sig Start Date End Date Taking? Authorizing Provider  Blood Glucose Monitoring Suppl (ONE TOUCH ULTRA 2) w/Device KIT Use to check BS BID-QID Dx:E11.9 05/19/17   [name], MD  BREO ELLIPTA 100-25 MCG/INH AEPB Inhale 1 puff by mouth once  daily Patient taking differently: Inhale 1 puff into the lungs daily. 08/09/20   [name], MD  Calcium Carbonate Antacid (TUMS PO) Take 1 tablet by mouth 3 (three) times daily with meals.    [provider]  Carboxymethylcellulose Sodium (THERATEARS OP) Place 1 drop into both eyes daily as needed (dry eyes).    [provider]  diphenhydrAMINE (BENADRYL) 25 MG tablet Take 25 mg by mouth daily as needed for allergies or itching. Patient not taking: No sig reported    [provider]  doxercalciferol (HECTOROL) 4 MCG/2ML injection Inject 1.5 mLs (3 mcg total) into the vein Every Tuesday,Thursday,and Saturday with dialysis. 09/07/20   Oswald Hillock, MD  gabapentin (NEURONTIN) 300 MG capsule TAKE 1 CAPSULE BY MOUTH THREE TIMES DAILY. REQUIRES OFFICE VISIT BEFORE ANY FURTHER REFILLS CAN BE GIVEN Patient taking differently: Take 300 mg by mouth 3 (three) times daily as needed (nerve pain). 06/21/20   [name], MD  insulin aspart (NOVOLOG) 100 UNIT/ML injection Inject 0-6 Units into the skin 3 (three) times daily with meals. Sliding scale insulin Less than 70 initiate hypoglycemia protocol 70-120  0 units 120-150 0 unit 151-200 1 units 201-250 2 units 251-300 3 units 301-350 4 units 351-400 5 units Greater than 400 call MD and give 6 units 09/06/20   Iraq, Marge Duncans, MD  insulin degludec (TRESIBA FLEXTOUCH) 100 UNIT/ML FlexTouch Pen Inject 20 Units into the skin daily. Hold if fasting blood sugars are <130. 09/06/20   Oswald Hillock, MD  Insulin Pen Needle (LIVE BETTER PEN NEEDLES) 31G X 6 MM MISC To use with Tresiba pens daily 02/05/19   [name], MD  Lancets [facility] ULTRASOFT) lancets Use as instructed 05/19/17   [name], MD  lidocaine-prilocaine (EMLA) cream Apply 1 application topically as needed. 10/11/20   Lincoln Brigham, PA-C  midodrine (PROAMATINE) 5 MG tablet Take 1 tablet (5 mg total) by mouth Every Tuesday,Thursday,and Saturday with dialysis.  09/07/20   Oswald Hillock, MD  multivitamin (RENA-VIT) TABS tablet Take 1 tablet by mouth daily.    [provider]  ondansetron (ZOFRAN) 8 MG tablet Take 1 tablet (8 mg total) by mouth every 8 (eight) hours as needed for nausea or vomiting. 10/11/20   Lincoln Brigham, PA-C  ONETOUCH ULTRA test strip USE AS DIRECTED TO MONITOR  FSBS 3 TIMES DAILY 01/27/20   Annie Main, FNP  oxyCODONE (ROXICODONE) 5 MG immediate release tablet Take 1 tablet (5 mg total) by mouth every 8 (eight) hours as needed for up to 10 doses. Patient not taking: No sig reported 09/06/20   Oswald Hillock, MD  PROAIR HFA 108 (90 Base) MCG/ACT inhaler INHALE 2 PUFFS BY MOUTH EVERY 6 HOURS AS NEEDED FOR WHEEZING FOR SHORTNESS OF BREATH Patient taking differently: Inhale 2 puffs into the lungs every 6 (six) hours as needed for wheezing or shortness of breath. 08/09/20   [name], MD  prochlorperazine (COMPAZINE) 10 MG tablet Take 1 tablet (10 mg total) by mouth every 6 (six) hours as needed for nausea or vomiting. 10/11/20   Dede Query T, PA-C  rosuvastatin (CRESTOR) 20 MG tablet Take 1 tablet (20 mg total) by mouth daily. 11/23/19   [name], MD  SANTYL ointment Apply 1 application topically daily as needed (wound care). 03/29/20   [provider]  traZODone (DESYREL) 50 MG tablet Take 0.5 tablets (25 mg total) by mouth at bedtime as needed for sleep. Patient not taking: No sig reported 09/06/20   Oswald Hillock, MD    Allergies    Latex, Penicillins, and Adhesive [tape]  Review of Systems   Review of Systems  Unable to perform ROS: Mental status change  Cardiovascular: Negative for chest pain.    Physical Exam Updated Vital Signs BP (!) 147/74   Pulse 73   Resp 19   Ht 5' 7"  (1.702 m)   Wt 87 kg   SpO2 100%   BMI 30.04 kg/m   Physical Exam Vitals and nursing note reviewed.  Constitutional:      Appearance: She is well-developed.  HENT:     Head: Normocephalic.     Nose: Nose  normal.  Eyes:     General: No scleral icterus.    Conjunctiva/sclera: Conjunctivae normal.  Neck:     Thyroid: No thyromegaly.  Cardiovascular:     Rate and Rhythm: Normal rate and regular rhythm.     Heart sounds: No murmur heard. No friction rub. No gallop.   Pulmonary:     Breath sounds: No stridor. No wheezing or rales.  Chest:     Chest wall: No tenderness.  Abdominal:     General: There is no distension.     Tenderness: There is no abdominal tenderness. There is no rebound.  Musculoskeletal:        General: Normal range of motion.     Cervical back: Neck supple.     Comments: Patient able to move all extremities no focal weakness  Lymphadenopathy:     Cervical: No cervical adenopathy.  Skin:    Findings: No erythema or rash.  Neurological:     Mental Status: She is alert.     Motor: No abnormal muscle tone.     Coordination: Coordination normal.     Comments: Patient will not answer questions appropriately.  She speaks and words that are put together without any sense.Marland Kitchen  She will follow some commands.     ED Results / Procedures / Treatments   Labs (all labs ordered are listed, but only abnormal results are displayed) Labs Reviewed  CBC - Abnormal; Notable for the following components:      Result Value   RBC 2.79 (*)    Hemoglobin 9.2 (*)    HCT 27.9 (*)    Platelets 148 (*)    All other components within normal limits  COMPREHENSIVE METABOLIC PANEL - Abnormal; Notable for the following components:   Glucose, Bld 118 (*)    Creatinine, Ser 4.84 (*)    Albumin 3.3 (*)    GFR, Estimated 9 (*)    All other components within normal  limits  CBG MONITORING, ED - Abnormal; Notable for the following components:   Glucose-Capillary 102 (*)    All other components within normal limits  I-STAT CHEM 8, ED - Abnormal; Notable for the following components:   Creatinine, Ser 5.00 (*)    Glucose, Bld 119 (*)    Calcium, Ion 1.11 (*)    Hemoglobin 8.2 (*)    HCT 24.0  (*)    All other components within normal limits  RESP PANEL BY RT-PCR (FLU A&B, COVID) ARPGX2  ETHANOL  PROTIME-INR  APTT  DIFFERENTIAL  RAPID URINE DRUG SCREEN, HOSP PERFORMED  URINALYSIS, ROUTINE W REFLEX MICROSCOPIC    EKG None  Radiology DG Chest Port 1 View  Result Date: 10/15/2020 CLINICAL DATA:  Weakness.  Code stroke. EXAM: PORTABLE CHEST 1 VIEW COMPARISON:  11/28/2012 FINDINGS: Aortic atherosclerosis. There is a right chest wall port a catheter with tip in the right atrium. No pleural effusion or edema. No airspace densities. The visualized osseous structures are unremarkable. IMPRESSION: No acute cardiopulmonary abnormalities. Electronically Signed   By: Kerby Moors M.D.   On: 10/16/2020 14:19   CT HEAD CODE STROKE WO CONTRAST  Result Date: 10/07/2020 CLINICAL DATA:  Code stroke. Neuro deficit, acute, stroke suspected. Additional history provided: Confusion, last known well 10 p.m. EXAM: CT HEAD WITHOUT CONTRAST TECHNIQUE: Contiguous axial images were obtained from the base of the skull through the vertex without intravenous contrast. COMPARISON:  No pertinent prior exams available for comparison. FINDINGS: Brain: Mild cerebral and cerebellar atrophy. Small chronic appearing cortical infarct within the medial left occipital lobe (for instance as seen on series 3, image 12). Mild patchy and ill-defined hypoattenuation within the cerebral white matter is nonspecific, but compatible chronic small vessel ischemic disease. There is no acute intracranial hemorrhage. No demarcated cortical infarct. No extra-axial fluid collection. No evidence of intracranial mass. No midline shift. Vascular: No hyperdense vessel.  Atherosclerotic calcifications. Skull: Normal. Negative for fracture or focal lesion. Sinuses/Orbits: Visualized orbits show no acute finding. No significant paranasal sinus disease. ASPECTS ([place] Stroke Program Early CT Score) - Ganglionic level infarction (caudate,  lentiform nuclei, internal capsule, insula, M1-M3 cortex): 7 - Supraganglionic infarction (M4-M6 cortex): 3 Total score (0-10 with 10 being normal): 10 These results were called by telephone at the time of interpretation on 09/19/2020 at 1:47 pm to provider Dr, Leonel Ramsay, who verbally acknowledged these results. IMPRESSION: No evidence of acute intracranial abnormality.  ASPECTS is 10. 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 Mild generalized parenchymal atrophy and cerebral white matter chronic small vessel ischemic disease. Electronically Signed   By: Kellie Simmering DO   On: 10/09/2020 13:51    Procedures Procedures   Medications Ordered in ED Medications  iohexol (OMNIPAQUE) 350 MG/ML injection 100 mL (100 mLs Intravenous Contrast Given 10/14/2020 1409)    ED Course  I have reviewed the triage vital signs and the nursing notes.  Pertinent labs & imaging results that were available during my care of the patient were reviewed by me and considered in my medical decision making (see chart for details).    CRITICAL CARE Performed by: Milton Ferguson Total critical care time:45 minutes Critical care time was exclusive of separately billable procedures and treating other patients. Critical care was necessary to treat or prevent imminent or life-threatening deterioration. Critical care was time spent personally by me on the following activities: development of treatment plan with patient and/or surrogate as well as nursing, discussions with consultants, evaluation of patient's response  to treatment, examination of patient, obtaining history from patient or surrogate, ordering and performing treatments and interventions, ordering and review of laboratory studies, ordering and review of radiographic studies, pulse oximetry and re-evaluation of patient's condition. Patient with altered mental status.  He was a code stroke.  Dr. Leonel Ramsay saw the patient.  He is requesting an MRI of the  brain and a call back with results.  He states the patient will need to be admitted to medicine with a neurology consult but he is not sure which hospital the patient will be admitted to MDM Rules/Calculators/A&P                           Final Clinical Impression(s) / ED Diagnoses Final diagnoses:  None    Rx / DC Orders ED Discharge Orders    None       Milton Ferguson, MD 11-02-2020 1011

## 2020-10-13 NOTE — Telephone Encounter (Signed)
Received call from pt's sister, [name].  She states that pt received her 1st chemo treatment yesterday - Folfox with 5FU pump.  She states pt did well yesterday but this morning she is very confused-pulling off colostomy bag, not knowing how to manage it all . She has been able to manage before today. [name] states she is just very confused and disoriented.  She is getting tangled up in her continuous 5FU pump tubing and is worried that she will pull it out. After discussing with Dede Query, PA, the decision was made for the sister tot take pt to the [facility] ED for evaluation.  [name] is agreeable to this.

## 2020-10-13 NOTE — ED Triage Notes (Signed)
Pt arrived via walk in, family states last known well at 10pm, pt woke this morning, at 0830 statements not making sense, appeared confused.

## 2020-10-13 NOTE — Consult Note (Addendum)
[facility] Telemedicine Consult   Requesting Provider: Sharmon Leyden Consult Participants: Patient's sister, nurse Location of the provider: [facility] Location of the patient: [facility]  This consult was provided via telemedicine with 2-way video and audio communication. The patient/family was informed that care would be provided in this way and agreed to receive care in this manner.    Chief Complaint: Difficulty speaking  HPI: 70 year old female with a history of renal cell carcinoma who was recently diagnosed with colonic adenocarcinoma and started chemotherapy yesterday with oxaliplatin, leucovorin, fluorouracil, and decadron who presents with acute confusion today.  She was slightly confused when she first woke up this morning, with progressive worsening after she laid down for a knapp she was last being completely normally around 10 PM.    LKW: 10 PM tpa given?: No, outside of window IR Thrombectomy? No, no LVO Modified Rankin Scale: 3-Moderate disability-requires help but walks WITHOUT assistance Time of teleneurologist evaluation: 1:30 pm  Exam: Vitals:   10/15/2020 1330 10/11/2020 1331  BP: 135/72   Pulse:  74  Resp: (!) 24     General: In bed, NAD  1A: Level of Consciousness - 0 1B: Ask Month and Age - 2 1C: 'Blink Eyes' & 'Squeeze Hands' - 1 2: Test Horizontal Extraocular Movements - 0 3: Test Visual Fields - 1 4: Test Facial Palsy - 1 5A: Test Left Arm Motor Drift - 0 5B: Test Right Arm Motor Drift - 0 6A: Test Left Leg Motor Drift - 0 6B: Test Right Leg Motor Drift - 0 7: Test Limb Ataxia - 0 8: Test Sensation - 0 9: Test Language/Aphasia- 2 10: Test Dysarthria - 0 11: Test Extinction/Inattention - 0 NIHSS score: 7  On exam, she appears to have flattening of the right nasolabial fold, she has perseveration repeating "yes" but is able to tell me her name and follow some simple commands.  She has no drift in response  to noxious stimulation equally bilaterally.  On testing her visual fields, she does fixate on the right, but later when checked with blink to threat, it is inconsistent. Possible partial cut?  Imaging Reviewed: CT head - negative, CTA - no LVO  Labs reviewed in epic and pertinent values follow: Na 139 Ionized calcium - 1.11 Cr 5 No leukocytosis, HgB 9.2   Assessment: 70 year old female with progressive confusion/aphasia today.  Possible etiologies include acute ischemic stroke, medication effect from recent chemotherapy, seizure.  Also possible with both Decadron and bevacizumab is acute posterior reversible encephalopathy syndrome(PRES) and this should be evident on MRI.  Recommendations:  1) MRI brain 2) EEG 3) ammonia, TSH 4) further recommendations pending the above imaging   This patient is receiving care for possible acute neurological changes. There was 55 minutes of care by this provider at the time of service, including time for direct evaluation via telemedicine, review of medical records, imaging studies and discussion of findings with providers, the patient and/or family.  Roland Rack, MD [facility] [phone]  If 7pm- 7am, please page neurology on call as listed in [place].

## 2020-10-13 NOTE — Procedures (Signed)
Arterial Catheter Insertion Procedure Note  [name]  [id number]  1950-08-06  Date:10/15/2020  Time:11:52 PM    Provider Performing: Ulice Dash    Procedure: Insertion of Arterial Line [phone]) with US guidance ([id number])   Indication(s) Blood pressure monitoring and/or need for frequent ABGs  Consent Unable to obtain consent due to emergent nature of procedure.  Anesthesia None   Time Out Verified patient identification, verified procedure, site/side was marked, verified correct patient position, special equipment/implants available, medications/allergies/relevant history reviewed, required imaging and test results available.   Sterile Technique Maximal sterile technique including full sterile barrier drape, hand hygiene, sterile gown, sterile gloves, mask, hair covering, sterile ultrasound probe cover (if used).   Procedure Description Area of catheter insertion was cleaned with chlorhexidine and draped in sterile fashion. With real-time ultrasound guidance an arterial catheter was placed into the right radial artery.  Appropriate arterial tracings confirmed on monitor.     Complications/Tolerance None; patient tolerated the procedure well.   EBL Minimal   Specimen(s) None

## 2020-10-13 NOTE — ED Notes (Addendum)
Report given to [facility], RN at [facility]. [facility] called for report.

## 2020-10-13 NOTE — Progress Notes (Signed)
[place]off[place] NP ordered 5FU to be discontinued from port. 10cc blood waste, 10cc NS flush. 5FU discarded in chemo bucket. Portable pump left at bedside. Homehealth notified that pump was at [facility].

## 2020-10-13 NOTE — ED Provider Notes (Signed)
Patient received in signout.  In brief history of adenocarcinoma.  Presents with altered mental status and generalized weakness.  Plan to get MRI and admit to the hospital.  I was called from MRI as the patient was actively vomiting.  On my evaluation, she continues to endorse nausea.  She was evaluated by Dr. Leonel Ramsay, neurology.  He feels that she is likely postictal.  PRESS would also be a consideration although her blood pressures have been fairly reassuring with last blood pressure of 140/71.  He states that the MRI is not urgent but would be helpful.  She is due for dialysis tomorrow.  We will plan for admission to the hospitalist for admission to [facility].  EEG tech is in the room per neurology orders   Physical Exam  BP 140/71   Pulse 75   Temp 97.8 F (36.6 C) (Oral)   Resp (!) 21   Ht 1.702 m (5\' 7" )   Wt 87 kg   SpO2 100%   BMI 30.04 kg/m       Merryl Hacker, MD 09/29/2020 1652

## 2020-10-13 NOTE — Progress Notes (Signed)
Attempted to get patient twice for MRI, once at 4pm and once @6pm . Pt too sick to attempt. Pt nausea and vomiting. Per MD pt will be transferred to [facility].

## 2020-10-13 NOTE — Progress Notes (Signed)
eLink Physician-Brief Progress Note Patient Name: [name] DOB: April 12, 1951 MRN: [id number]   Date of Service  09/23/2020  HPI/Events of Note  Hypotension.  eICU Interventions  Norepinephrine gtt ordered, arterial line  ordered, stat BNP to assess volume status.        [name] 10/14/2020, 10:35 PM

## 2020-10-13 NOTE — Progress Notes (Signed)
EEG Completed; Results Pending  

## 2020-10-13 NOTE — Progress Notes (Signed)
Declined more access at this time. Patient currently has 18g PIV and PORT accessed. Fran Lowes, RN VAST

## 2020-10-13 NOTE — Code Documentation (Signed)
  Patient Name: [name]   MRN: [id number]   Date of Birth/ Sex: 14-Apr-1951 , female      Admission Date: 10/02/2020  Attending Provider: Terrilee Croak, MD  Primary Diagnosis: TIA (transient ischemic attack)   Indication: Pt was in her usual state of health until this PM, when she was noted to be in Vfib subsequently became hypoxic and unresponsive. Code blue was subsequently called. At the time of arrival on scene, ACLS protocol was underway.   Technical Description:  - CPR performance duration:  20 minutes  - Was defibrillation or cardioversion used? Yes   - Was external pacer placed? Yes  - Was patient intubated pre/post CPR? Yes   Medications Administered: Y = Yes; Blank = No Amiodarone  y  Atropine    Calcium  y  Epinephrine  y  Lidocaine    Magnesium  y  Norepinephrine    Phenylephrine    Sodium bicarbonate  y  Vasopressin     Post CPR evaluation:  - Final Status - Was patient successfully resuscitated ? Yes - What is current rhythm? Wide complex tachycardia - What is current hemodynamic status? Improved  Miscellaneous Information:  - Labs sent, including: CBC, RFP, troponin, lactic acid, abg, mg, phos, CXR, EKG  - Primary team notified?  Yes  - Family Notified? Yes  - Additional notes/ transfer status: Patient with prolonged QT treated with zofran earlier today, subsequently found in v fib arrest, obtain ROSC after 3 shock and medications,  stable for transfer to ICU.     Iona Beard, MD  09/20/2020, 9:53 PM

## 2020-10-13 NOTE — ED Notes (Signed)
Last known well 10pm last night

## 2020-10-13 NOTE — Procedures (Signed)
EEG Report Indication: possible seizure activity  This study was recorded in the waiting/drowsy state.  The duration of the study was 24 minutes.  Electrodes were placed according to the International 10/20 system.  Video was reviewed/available for clinical correlation as needed.  In the waking state, discernible but diminished background organization is seen with a severely attenuated anterior - posterior voltage and frequency gradient.  In the occipital leads there was a symmetric and reactive although indistinct and poorly sustained posterior dominant rhythm of approximately 6 hertz, which is slower than expected for age  Anteriorly, is the expected pattern of faster frequency, lower voltage waveforms, although in a reduced amount as compared to normal. In addition, there was diffusely seen intermixed low - moderate amplitude Delta and theta slowing with overlying faster frequencies.  During the drowsy state, there is further attenuation of the gradient, a general shift to slower frequencies diffusely, a waxing and waning of the posterior dominant rhythm, and slow roving eye movements.  Hyperventilation: deferred Photic stimulation: deferred  There are no clear focal, paroxysmal or epileptiform abnormalities  or interhemispheric asymmetries.  Impression:  This is an abnormal waking and drowsy study due to markedly diminished background organization as described above, most clinically compatible with a mild-moderate diffuse encephalopathy.  There are no clear focal or epileptiform abnormalities.

## 2020-10-13 NOTE — Progress Notes (Signed)
   10/06/2020 2128  Clinical Encounter Type  Visited With  (Was not able to visit with patient or MD Staff.)  Visit Type Code  Referral From  (Code Blue Page)  Chaplain responded to code blue page, upon arrival medical team was working to regain a pulse for [name].  Pt was revived.  Family was called by MD and Chaplain was not needed at the present time.    Chaplain Amy Gothard Morgan-Simpson [phone]

## 2020-10-14 ENCOUNTER — Observation Stay (HOSPITAL_COMMUNITY): Payer: Medicare Other

## 2020-10-14 ENCOUNTER — Encounter (HOSPITAL_COMMUNITY): Payer: Self-pay | Admitting: Internal Medicine

## 2020-10-14 ENCOUNTER — Inpatient Hospital Stay (HOSPITAL_COMMUNITY): Payer: Medicare Other

## 2020-10-14 ENCOUNTER — Inpatient Hospital Stay: Payer: Medicare Other

## 2020-10-14 DIAGNOSIS — Z992 Dependence on renal dialysis: Secondary | ICD-10-CM | POA: Diagnosis not present

## 2020-10-14 DIAGNOSIS — G459 Transient cerebral ischemic attack, unspecified: Secondary | ICD-10-CM

## 2020-10-14 DIAGNOSIS — G931 Anoxic brain damage, not elsewhere classified: Secondary | ICD-10-CM | POA: Diagnosis not present

## 2020-10-14 DIAGNOSIS — E1165 Type 2 diabetes mellitus with hyperglycemia: Secondary | ICD-10-CM | POA: Diagnosis present

## 2020-10-14 DIAGNOSIS — D631 Anemia in chronic kidney disease: Secondary | ICD-10-CM | POA: Diagnosis present

## 2020-10-14 DIAGNOSIS — F05 Delirium due to known physiological condition: Secondary | ICD-10-CM | POA: Diagnosis present

## 2020-10-14 DIAGNOSIS — N186 End stage renal disease: Secondary | ICD-10-CM | POA: Diagnosis present

## 2020-10-14 DIAGNOSIS — G9389 Other specified disorders of brain: Secondary | ICD-10-CM | POA: Diagnosis not present

## 2020-10-14 DIAGNOSIS — R0902 Hypoxemia: Secondary | ICD-10-CM | POA: Diagnosis not present

## 2020-10-14 DIAGNOSIS — R579 Shock, unspecified: Secondary | ICD-10-CM | POA: Diagnosis not present

## 2020-10-14 DIAGNOSIS — Z66 Do not resuscitate: Secondary | ICD-10-CM | POA: Diagnosis present

## 2020-10-14 DIAGNOSIS — L89623 Pressure ulcer of left heel, stage 3: Secondary | ICD-10-CM | POA: Diagnosis present

## 2020-10-14 DIAGNOSIS — T797XXA Traumatic subcutaneous emphysema, initial encounter: Secondary | ICD-10-CM | POA: Diagnosis not present

## 2020-10-14 DIAGNOSIS — R4701 Aphasia: Secondary | ICD-10-CM | POA: Diagnosis not present

## 2020-10-14 DIAGNOSIS — I5043 Acute on chronic combined systolic (congestive) and diastolic (congestive) heart failure: Secondary | ICD-10-CM | POA: Diagnosis present

## 2020-10-14 DIAGNOSIS — J439 Emphysema, unspecified: Secondary | ICD-10-CM | POA: Diagnosis not present

## 2020-10-14 DIAGNOSIS — I472 Ventricular tachycardia: Secondary | ICD-10-CM | POA: Diagnosis not present

## 2020-10-14 DIAGNOSIS — S270XXA Traumatic pneumothorax, initial encounter: Secondary | ICD-10-CM | POA: Diagnosis not present

## 2020-10-14 DIAGNOSIS — E1129 Type 2 diabetes mellitus with other diabetic kidney complication: Secondary | ICD-10-CM | POA: Diagnosis not present

## 2020-10-14 DIAGNOSIS — I517 Cardiomegaly: Secondary | ICD-10-CM | POA: Diagnosis not present

## 2020-10-14 DIAGNOSIS — G9341 Metabolic encephalopathy: Secondary | ICD-10-CM | POA: Diagnosis present

## 2020-10-14 DIAGNOSIS — J3489 Other specified disorders of nose and nasal sinuses: Secondary | ICD-10-CM | POA: Diagnosis not present

## 2020-10-14 DIAGNOSIS — J9601 Acute respiratory failure with hypoxia: Secondary | ICD-10-CM | POA: Diagnosis not present

## 2020-10-14 DIAGNOSIS — N2581 Secondary hyperparathyroidism of renal origin: Secondary | ICD-10-CM | POA: Diagnosis present

## 2020-10-14 DIAGNOSIS — E1122 Type 2 diabetes mellitus with diabetic chronic kidney disease: Secondary | ICD-10-CM | POA: Diagnosis present

## 2020-10-14 DIAGNOSIS — J9690 Respiratory failure, unspecified, unspecified whether with hypoxia or hypercapnia: Secondary | ICD-10-CM | POA: Diagnosis not present

## 2020-10-14 DIAGNOSIS — R4182 Altered mental status, unspecified: Secondary | ICD-10-CM | POA: Diagnosis not present

## 2020-10-14 DIAGNOSIS — I132 Hypertensive heart and chronic kidney disease with heart failure and with stage 5 chronic kidney disease, or end stage renal disease: Secondary | ICD-10-CM | POA: Diagnosis present

## 2020-10-14 DIAGNOSIS — R9431 Abnormal electrocardiogram [ECG] [EKG]: Secondary | ICD-10-CM | POA: Diagnosis not present

## 2020-10-14 DIAGNOSIS — I2510 Atherosclerotic heart disease of native coronary artery without angina pectoris: Secondary | ICD-10-CM | POA: Diagnosis present

## 2020-10-14 DIAGNOSIS — Z20822 Contact with and (suspected) exposure to covid-19: Secondary | ICD-10-CM | POA: Diagnosis present

## 2020-10-14 DIAGNOSIS — Z515 Encounter for palliative care: Secondary | ICD-10-CM | POA: Diagnosis not present

## 2020-10-14 DIAGNOSIS — I4901 Ventricular fibrillation: Secondary | ICD-10-CM | POA: Diagnosis not present

## 2020-10-14 DIAGNOSIS — C189 Malignant neoplasm of colon, unspecified: Secondary | ICD-10-CM | POA: Diagnosis not present

## 2020-10-14 DIAGNOSIS — C786 Secondary malignant neoplasm of retroperitoneum and peritoneum: Secondary | ICD-10-CM | POA: Diagnosis present

## 2020-10-14 DIAGNOSIS — I1311 Hypertensive heart and chronic kidney disease without heart failure, with stage 5 chronic kidney disease, or end stage renal disease: Secondary | ICD-10-CM | POA: Diagnosis not present

## 2020-10-14 DIAGNOSIS — F32A Depression, unspecified: Secondary | ICD-10-CM | POA: Diagnosis present

## 2020-10-14 DIAGNOSIS — E113299 Type 2 diabetes mellitus with mild nonproliferative diabetic retinopathy without macular edema, unspecified eye: Secondary | ICD-10-CM | POA: Diagnosis present

## 2020-10-14 DIAGNOSIS — Z9911 Dependence on respirator [ventilator] status: Secondary | ICD-10-CM | POA: Diagnosis not present

## 2020-10-14 DIAGNOSIS — I639 Cerebral infarction, unspecified: Secondary | ICD-10-CM | POA: Diagnosis not present

## 2020-10-14 LAB — CBC
HCT: 29.6 % — ABNORMAL LOW (ref 36.0–46.0)
Hemoglobin: 9.9 g/dL — ABNORMAL LOW (ref 12.0–15.0)
MCH: 33.2 pg (ref 26.0–34.0)
MCHC: 33.4 g/dL (ref 30.0–36.0)
MCV: 99.3 fL (ref 80.0–100.0)
Platelets: 259 10*3/uL (ref 150–400)
RBC: 2.98 MIL/uL — ABNORMAL LOW (ref 3.87–5.11)
RDW: 15.6 % — ABNORMAL HIGH (ref 11.5–15.5)
WBC: 15.2 10*3/uL — ABNORMAL HIGH (ref 4.0–10.5)
nRBC: 0 % (ref 0.0–0.2)

## 2020-10-14 LAB — GLUCOSE, CAPILLARY
Glucose-Capillary: 257 mg/dL — ABNORMAL HIGH (ref 70–99)
Glucose-Capillary: 227 mg/dL — ABNORMAL HIGH (ref 70–99)
Glucose-Capillary: 242 mg/dL — ABNORMAL HIGH (ref 70–99)
Glucose-Capillary: 260 mg/dL — ABNORMAL HIGH (ref 70–99)
Glucose-Capillary: 320 mg/dL — ABNORMAL HIGH (ref 70–99)
Glucose-Capillary: 340 mg/dL — ABNORMAL HIGH (ref 70–99)

## 2020-10-14 LAB — LIPID PANEL
Cholesterol: 134 mg/dL (ref 0–200)
HDL: 39 mg/dL — ABNORMAL LOW (ref >40)
LDL Cholesterol: 71 mg/dL (ref 0–99)
Total CHOL/HDL Ratio: 3.4 RATIO
Triglycerides: 121 mg/dL (ref <150)
VLDL: 24 mg/dL (ref 0–40)

## 2020-10-14 LAB — LACTIC ACID, PLASMA: Lactic Acid, Venous: 3.1 mmol/L (ref 0.5–1.9)

## 2020-10-14 LAB — PHOSPHORUS
Phosphorus: 8.2 mg/dL — ABNORMAL HIGH (ref 2.5–4.6)
Phosphorus: 8.7 mg/dL — ABNORMAL HIGH (ref 2.5–4.6)

## 2020-10-14 LAB — TROPONIN I (HIGH SENSITIVITY)
Troponin I (High Sensitivity): 517 ng/L (ref <18)
Troponin I (High Sensitivity): 836 ng/L (ref <18)
Troponin I (High Sensitivity): 906 ng/L (ref <18)

## 2020-10-14 LAB — BASIC METABOLIC PANEL
Anion gap: 15 (ref 5–15)
BUN: 26 mg/dL — ABNORMAL HIGH (ref 8–23)
CO2: 24 mmol/L (ref 22–32)
Calcium: 8.9 mg/dL (ref 8.9–10.3)
Chloride: 98 mmol/L (ref 98–111)
Creatinine, Ser: 5.82 mg/dL — ABNORMAL HIGH (ref 0.44–1.00)
GFR, Estimated: 7 mL/min — ABNORMAL LOW (ref >60)
Glucose, Bld: 265 mg/dL — ABNORMAL HIGH (ref 70–99)
Potassium: 4.9 mmol/L (ref 3.5–5.1)
Sodium: 137 mmol/L (ref 135–145)

## 2020-10-14 LAB — BRAIN NATRIURETIC PEPTIDE: B Natriuretic Peptide: 271 pg/mL — ABNORMAL HIGH (ref 0.0–100.0)

## 2020-10-14 LAB — MAGNESIUM
Magnesium: 2 mg/dL (ref 1.7–2.4)
Magnesium: 2 mg/dL (ref 1.7–2.4)

## 2020-10-14 LAB — MRSA PCR SCREENING: MRSA by PCR: NEGATIVE

## 2020-10-14 MED ORDER — BUDESONIDE 0.5 MG/2ML IN SUSP
0.5000 mg | Freq: Two times a day (BID) | RESPIRATORY_TRACT | Status: DC
  Administered 2020-10-14 – 2020-10-16 (×5): 0.5 mg via RESPIRATORY_TRACT
  Filled 2020-10-14 (×6): qty 2

## 2020-10-14 MED ORDER — CHLORHEXIDINE GLUCONATE 0.12% ORAL RINSE (MEDLINE KIT)
15.0000 mL | Freq: Two times a day (BID) | OROMUCOSAL | Status: DC
  Administered 2020-10-14 – 2020-10-17 (×5): 15 mL via OROMUCOSAL

## 2020-10-14 MED ORDER — THIAMINE HCL 100 MG/ML IJ SOLN
500.0000 mg | Freq: Three times a day (TID) | INTRAVENOUS | Status: DC
  Administered 2020-10-14 – 2020-10-16 (×7): 500 mg via INTRAVENOUS
  Filled 2020-10-14 (×9): qty 5

## 2020-10-14 MED ORDER — NOREPINEPHRINE 16 MG/250ML-% IV SOLN
0.0000 ug/min | INTRAVENOUS | Status: DC
  Administered 2020-10-14: 2 ug/min via INTRAVENOUS
  Administered 2020-10-15: 35 ug/min via INTRAVENOUS
  Administered 2020-10-15: 34 ug/min via INTRAVENOUS
  Administered 2020-10-15 – 2020-10-16 (×2): 36 ug/min via INTRAVENOUS
  Administered 2020-10-17: 23 ug/min via INTRAVENOUS
  Filled 2020-10-14 (×7): qty 250

## 2020-10-14 MED ORDER — LIDOCAINE HCL (PF) 1 % IJ SOLN
INTRAMUSCULAR | Status: AC
  Administered 2020-10-14: 1 mL
  Filled 2020-10-14: qty 5

## 2020-10-14 MED ORDER — PROSOURCE TF PO LIQD
90.0000 mL | Freq: Three times a day (TID) | ORAL | Status: DC
  Administered 2020-10-14 – 2020-10-16 (×8): 90 mL
  Filled 2020-10-14 (×8): qty 90

## 2020-10-14 MED ORDER — INSULIN ASPART 100 UNIT/ML ~~LOC~~ SOLN
0.0000 [IU] | SUBCUTANEOUS | Status: DC
  Administered 2020-10-14: 8 [IU] via SUBCUTANEOUS
  Administered 2020-10-14 (×2): 11 [IU] via SUBCUTANEOUS
  Administered 2020-10-15 (×2): 8 [IU] via SUBCUTANEOUS

## 2020-10-14 MED ORDER — VITAL HIGH PROTEIN PO LIQD: 1000.0000 mL | ORAL | Status: DC

## 2020-10-14 MED ORDER — ROSUVASTATIN CALCIUM 20 MG PO TABS
20.0000 mg | ORAL_TABLET | Freq: Every day | ORAL | Status: DC
  Administered 2020-10-14 – 2020-10-16 (×3): 20 mg
  Filled 2020-10-14 (×3): qty 1

## 2020-10-14 MED ORDER — FENTANYL 2500MCG IN NS 250ML (10MCG/ML) PREMIX INFUSION
0.0000 ug/h | INTRAVENOUS | Status: DC
  Administered 2020-10-14: 25 ug/h via INTRAVENOUS
  Administered 2020-10-14: 100 ug/h via INTRAVENOUS
  Administered 2020-10-15: 125 ug/h via INTRAVENOUS
  Administered 2020-10-16: 75 ug/h via INTRAVENOUS
  Filled 2020-10-14 (×5): qty 250

## 2020-10-14 MED ORDER — ORAL CARE MOUTH RINSE
15.0000 mL | OROMUCOSAL | Status: DC
  Administered 2020-10-14 – 2020-10-17 (×24): 15 mL via OROMUCOSAL

## 2020-10-14 MED ORDER — SENNA 8.6 MG PO TABS
1.0000 | ORAL_TABLET | Freq: Two times a day (BID) | ORAL | Status: DC
  Administered 2020-10-15 – 2020-10-16 (×4): 8.6 mg
  Filled 2020-10-14 (×4): qty 1

## 2020-10-14 MED ORDER — INSULIN GLARGINE 100 UNIT/ML ~~LOC~~ SOLN
10.0000 [IU] | Freq: Once | SUBCUTANEOUS | Status: AC
  Administered 2020-10-14: 10 [IU] via SUBCUTANEOUS
  Filled 2020-10-14: qty 0.1

## 2020-10-14 MED ORDER — INSULIN GLARGINE 100 UNIT/ML ~~LOC~~ SOLN
20.0000 [IU] | Freq: Every day | SUBCUTANEOUS | Status: DC
  Administered 2020-10-14: 20 [IU] via SUBCUTANEOUS
  Filled 2020-10-14 (×2): qty 0.2

## 2020-10-14 MED ORDER — PROSOURCE TF PO LIQD: 45.0000 mL | Freq: Two times a day (BID) | ORAL | Status: DC

## 2020-10-14 MED ORDER — VITAL 1.5 CAL PO LIQD
1000.0000 mL | ORAL | Status: DC
  Administered 2020-10-14 – 2020-10-16 (×3): 1000 mL
  Filled 2020-10-14: qty 1000

## 2020-10-14 NOTE — Progress Notes (Signed)
eLink Physician-Brief Progress Note Patient Name: [name] DOB: November 02, 1950 MRN: [id number]   Date of Service  10/14/2020  HPI/Events of Note  CXR reveals moderate left sided pneumothorax.  eICU Interventions  Will request ground crew to place a chest tube.        [name] [name] 10/14/2020, 4:53 AM

## 2020-10-14 NOTE — Consult Note (Addendum)
NEURO HOSPITALIST CONSULT NOTE   Requestig physician: Dr. Pietro Cassis  Reason for Consult: Acute onset of confusion at home after first chemotherapy infusion for colonic adenocarcinoma  History obtained from:  Chart    HPI:                                                                                                                                          [name] is an 70 y.o. female with a PMHx of DM, diabetic retinopathy, diabetic right foot ulcer, HTN, ESRD on HD, renal cell carcinoma, recent diagnosis of colonic adenocarcinoma on chemotherapy which was started two days ago (3/24) with oxaliplatin, leucovorin, fluorouracil, and decadron, coronary artery calcification on CT and HLD who initially presented to [facility] on Friday for evaluation of acute onset of confusion after her first chemotherapy treatment. She was noted to be slightly confused when she first woke up Friday morning, with progressive worsening after she laid down for a nap; for example, she was pulling off her colostomy bag not knowing how to manage it at all which she was able to do before. On presentation to the [facility] ED, she was globally confused and disoriented. She was last seen being completely normally around 10 PM on Thursday.   In the ED, patient was actively throwing up.  She was having chills but no fever. She was confused. History Sister Ms. [name] was at bedside and helped with the history.  Teleneurology was consulted in the [facility] ED. NIHSS was 7. On exams by EDP and Teleneurology, there was no focal weakness, but she appeared to have flattening of the right nasolabial fold, was able to follow some commands but speaking words that did not make any sense and was perseverating, was able to tell her name and follow some simple commands, no drift in response to noxious stimulation equally bilaterally, inconsistent possible deficit on testing of visual fields with partial visual field cut suspected. CT head  was negative. CTA showed no LVO. DDx for her progressive confusion per Teleneurology was acute ischemic stroke, medication effect from recent chemotherapy, postictal confusion due to unwitnessed seizure and PRES secondary to Decadron and bevacizumab. An MRI brain, EEG, ammonia and TSH were recommended.   The patient was transferred to [facility] for higher level of care. W  Past Medical History:  Diagnosis Date  . Anemia   . Anemia associated with chronic renal failure   . Anemia in chronic kidney disease 09/29/2018  . Arthritis   . Asthma   . Blood transfusion without reported diagnosis    Phreesia 02/27/2020  . Cancer ([place])    Phreesia 02/27/2020  . Cataract    OD  . Chronic kidney disease    Phreesia 02/27/2020  . Coronary artery calcification seen on CAT scan  02/17/2018   Coronary calcification on CT  . Depression   . Diabetes mellitus without complication ([place])    Phreesia 02/27/2020  . Diabetic retinopathy of both eyes ([place])   . Diabetic ulcer of right foot associated with type 2 diabetes mellitus ([place])   . ESRD (end stage renal disease) on dialysis ([place])   . Essential hypertension 02/17/2018   Essential hypertension  . GERD (gastroesophageal reflux disease)    pt denies  . HLD (hyperlipidemia)   . Hypertension   . Hypertensive retinopathy    OU  . Left ventricular dysfunction 04/07/2018   Left ventricle dysfunction  . Microalbuminuria due to type 2 diabetes mellitus ([place])   . Neuromuscular disorder ([place])    diabetic neuropathy  . Nonproliferative retinopathy due to secondary diabetes ([place])   . Nonsustained ventricular tachycardia ([place]) 08/28/2018   Nonsustained ventricular tachycardia  . Pneumonia    hx of   . Renal cell cancer ([place])   . Renal insufficiency   . Right renal mass 03/10/2017  . Type 2 diabetes, controlled, with neuropathy ([place]) 06/22/2013  . Ulcer of other part of foot 06/22/2013  . 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 AMPUTATION TOE Right 10/29/2019   Procedure: RIGHT 2ND TOE AMPUTATION;  Surgeon: Edrick Kins, DPM;  Location: [facility];  Service: Podiatry;  Laterality: Right;  . AMPUTATION TOE Bilateral 09/04/2020   Procedure: RIGHT THIRD DIGIT AMPUTATION;  Surgeon: Edrick Kins, DPM;  Location: [place];  Service: Podiatry;  Laterality: Bilateral;  . BASCILIC VEIN TRANSPOSITION Left 08/08/2017   Procedure: BASILIC VEIN TRANSPOSITION FIRST STAGE LEFT ARM;  Surgeon: Serafina Mitchell, MD;  Location: [place];  Service: Vascular;  Laterality: Left;  . BASCILIC VEIN TRANSPOSITION Left 05/14/2018   Procedure: SECOND STAGE BASILIC VEIN TRANSPOSITION LEFT ARM;  Surgeon: Serafina Mitchell, MD;  Location: [place];  Service: Vascular;  Laterality: Left;  . BIOPSY  08/29/2020   Procedure: BIOPSY;  Surgeon: Yetta Flock, MD;  Location: [place];  Service: Gastroenterology;;  . CATARACT EXTRACTION Left   . COLONOSCOPY WITH PROPOFOL N/A 08/29/2020   Procedure: COLONOSCOPY WITH PROPOFOL;  Surgeon: Yetta Flock, MD;  Location: [place];  Service: Gastroenterology;  Laterality: N/A;  . EYE SURGERY    . ILEOSTOMY Right 08/30/2020   Procedure: ILEOSTOMY;  Surgeon: Jesusita Oka, MD;  Location: [place];  Service: General;  Laterality: Right;  . IR IMAGING GUIDED PORT INSERTION  10/04/2020  . PARTIAL COLECTOMY N/A 08/30/2020   Procedure: HEMI COLECTOMY;  Surgeon: Jesusita Oka, MD;  Location: [place];  Service: General;  Laterality: N/A;  . POLYPECTOMY  08/29/2020   Procedure: POLYPECTOMY;  Surgeon: Yetta Flock, MD;  Location: [facility];  Service: Gastroenterology;;  . ROBOTIC ADRENALECTOMY Left 03/10/2017   Procedure: XI ROBOTIC ADRENALECTOMY;  Surgeon: Nickie Retort, MD;  Location: [facility];  Service: Urology;  Laterality: Left;  . ROBOTIC ASSITED PARTIAL NEPHRECTOMY Right 03/10/2017   Procedure: XI ROBOTIC ASSITED RADICAL NEPHRECTOMY;  Surgeon: Nickie Retort, MD;  Location: [facility];  Service: Urology;  Laterality: Right;  . SUBMUCOSAL TATTOO INJECTION  08/29/2020   Procedure: SUBMUCOSAL TATTOO INJECTION;  Surgeon: Yetta Flock, MD;  Location: [facility]COPY;  Service: Gastroenterology;;  . TOE AMPUTATION Bilateral    both great toe ,, left foot 2nd toe 1/2  . TOE ARTHROPLASTY  09/04/2020   Procedure:  ARTHROPLASTY OF THE THIRD DIGIT LEFT FOOT;  Surgeon: Edrick Kins, DPM;  Location: [place] [facility];  Service: Podiatry;;    Family History  Problem Relation Age of Onset  . Heart disease Mother   . Diabetes Sister               Social History:  reports that she has never smoked. She has never used smokeless tobacco. She reports current alcohol use. She reports that she does not use drugs.  Allergies  Allergen Reactions  . Latex Itching  . Penicillins Other (See Comments)    UNSPECIFIED CHILDHOOD REACTION  Has patient had a PCN reaction causing immediate rash, facial/tongue/throat swelling, SOB or lightheadedness with hypotension: Unknown Has patient had a PCN reaction causing severe rash involving mucus membranes or skin necrosis: Unknown Has patient had a PCN reaction that required hospitalization: Unknown Has patient had a PCN reaction occurring within the last 10 years: Unknown If all of the above answers are "NO", then may proceed with Cephalosporin use.   . Adhesive [Tape] Rash    MEDICATIONS:                                                                                                                     Scheduled: . Chlorhexidine Gluconate Cloth  6 each Topical Daily  . docusate  100 mg Per Tube BID  . feeding supplement (PROSource TF)  45 mL Per Tube BID  . feeding supplement (VITAL HIGH PROTEIN)  1,000 mL Per Tube Q24H  . heparin  5,000 Units Subcutaneous Q8H  . insulin aspart  0-15 Units Subcutaneous Q4H  . insulin glargine  10 Units Subcutaneous Once  . insulin glargine  20 Units Subcutaneous QHS  . pantoprazole  sodium  40 mg Per Tube Q1200  . polyethylene glycol  17 g Per Tube Daily  . rosuvastatin  20 mg Per Tube Daily  . senna  1 tablet Per Tube BID  . sodium chloride flush  10-40 mL Intracatheter Q12H   Continuous: . fentaNYL infusion INTRAVENOUS 100 mcg/hr (10/14/20 0600)  . norepinephrine (LEVOPHED) Adult infusion 10 mcg/min (10/14/20 0600)     ROS:                                                                                                                                       Unable to obtain due to AMS.    Blood pressure  111/67, pulse 79, temperature 97.8 F (36.6 C), temperature source Axillary, resp. rate (!) 22, height 5' 7"  (1.702 m), weight 88.6 kg, SpO2 91 %.   General Examination:                                                                                                       Physical Exam  HEENT-  [place]/AT    Lungs- Intubated Extremities- Noncyanotic  Neurological Examination Mental Status: Intubated and sedated on fentanyl. Will open eyes and follow some simple commands, but does not appear able to comprehend complex commands.  Cranial Nerves: II: PERRL. Will gaze in the general direction of examiner and RN when asked, but does not appear to fixate directly on objects.  III,IV, VI: No ptosis. Saccadic visual pursuits noted; gazes to left and right. Eyes are conjugate. No forced gaze deviation.   VII: Unable to assess for smnormal bilateralile due to intubation.  VIII: hearing intact to voice IX,X: Intubated XI: Head is midline XII: Unable to assess Motor: Will grip examiner's hands but will not let go when asked. Attempts to lift BUE when asked; can lift right forearm antigravity but not left. Cannot lift arms at deltoids. Tone decreased bilaterally.  BLE: Will move to command bilaterally without asymmetry. Tone mildly decreased.  Sensory: Reacts to touch x 4 Deep Tendon Reflexes: Hypoactive x 4 Plantars: Equivocal bilaterally  Cerebellar: Unable to assess  due to weakness and AMS Gait: Unable to assess   Lab Results: Basic Metabolic Panel: Recent Labs  Lab 10/11/20 1506 10/12/2020 1347 09/28/2020 1403 10/08/2020 2153 09/19/2020 2218 10/14/20 0400  NA 144 139 139 140 138 137  K 4.6 4.4 4.7 4.7 4.5 4.9  CL 98 99 98 101  --  98  CO2 31 28  --  21*  --  24  GLUCOSE 208* 118* 119* 217*  --  265*  BUN 18 21 20 22   --  26*  CREATININE 5.82* 4.84* 5.00* 5.26*  --  5.82*  CALCIUM 9.2 8.9  --  9.2  --  8.9  MG  --   --   --  2.4  --   --   PHOS  --   --   --  6.7*  --   --     CBC: Recent Labs  Lab 10/11/20 1506 10/14/2020 1347 10/08/2020 1403 10/04/2020 2153 09/28/2020 2218 10/14/20 0400  WBC 6.6 7.7  --  12.1*  --  15.2*  NEUTROABS 4.6 5.8  --   --   --   --   HGB 9.9* 9.2* 8.2* 11.4* 9.2* 9.9*  HCT 29.7* 27.9* 24.0* 33.9* 27.0* 29.6*  MCV 99.3 100.0  --  100.3*  --  99.3  PLT 162 148*  --  169  --  259    Cardiac Enzymes: No results for input(s): CKTOTAL, CKMB, CKMBINDEX, TROPONINI in the last 168 hours.  Lipid Panel: Recent Labs  Lab 10/14/20 0400  CHOL 134  TRIG 121  HDL 39*  CHOLHDL 3.4  VLDL 24  LDLCALC 71    Imaging:  DG CHEST PORT 1 VIEW  Result Date: 10/14/2020 CLINICAL DATA:  Chest tube placement EXAM: PORTABLE CHEST 1 VIEW COMPARISON:  10/14/2020 at 0417 hours FINDINGS: Indwelling left chest tube. A persistent trace left pneumothorax may be obscured by the overlying defibrillator pad. Right lung is essentially clear.  No definite pleural effusion. Endotracheal tube terminates 10 mm above the carina. Enteric tube courses into the proximal stomach. Right chest power port terminates at the cavoatrial junction. Mild cardiomegaly. IMPRESSION: Indwelling left chest tube. A persistent trace left pneumothorax may be obscured by the overlying defibrillator pad. Endotracheal tube terminates 10 mm above the carina. Additional support apparatus as above. Electronically Signed   By: Julian Hy M.D.   On: 10/14/2020 06:18   DG  Chest Port 1 View  Result Date: 10/14/2020 CLINICAL DATA:  Acute respiratory failure EXAM: PORTABLE CHEST 1 VIEW COMPARISON:  Yesterday FINDINGS: Endotracheal tube with tip 7 mm above the carina. The enteric tube tip is at the distal stomach or proximal duodenum. Right port with tip at the upper cavoatrial junction. Moderate left pneumothorax which could be up to 50%. There is superimposed pulmonary and pleural density. New chest wall emphysema. Critical Value/emergent results were called by telephone at the time of interpretation on 10/14/2020 at 4:49 am to provider Trevor Mace , who verbally acknowledged these results. IMPRESSION: 1. New moderate left pneumothorax and chest wall emphysema. 2. Worsening left pulmonary aeration with probable pleural fluid. 3. Low endotracheal tube with tip 7 mm above the carina. The enteric tube tip at least reaches the pylorus. Electronically Signed   By: Monte Fantasia M.D.   On: 10/14/2020 04:50   DG CHEST PORT 1 VIEW  Result Date: 10/12/2020 CLINICAL DATA:  Respiratory failure, history of colon cancer EXAM: PORTABLE CHEST 1 VIEW COMPARISON:  10/12/2020 at 1:45 p.m. FINDINGS: Single frontal view of the chest demonstrates endotracheal tube overlying tracheal air column, tip approximately 1.5 cm above carina. External defibrillator pads overlie the cardiac silhouette. Right chest wall port via internal jugular approach unchanged. Cardiac silhouette is stable. No airspace disease, effusion, or pneumothorax. IMPRESSION: 1. No complication after intubation. No acute intrathoracic process. Electronically Signed   By: Randa Ngo M.D.   On: 09/30/2020 22:15   DG Chest Port 1 View  Result Date: 10/16/2020 CLINICAL DATA:  Weakness.  Code stroke. EXAM: PORTABLE CHEST 1 VIEW COMPARISON:  11/28/2012 FINDINGS: Aortic atherosclerosis. There is a right chest wall port a catheter with tip in the right atrium. No pleural effusion or edema. No airspace densities. The visualized  osseous structures are unremarkable. IMPRESSION: No acute cardiopulmonary abnormalities. Electronically Signed   By: Kerby Moors M.D.   On: 09/20/2020 14:19   DG Abd Portable 1V  Result Date: 09/24/2020 CLINICAL DATA:  Check gastric catheter placement EXAM: PORTABLE ABDOMEN - 1 VIEW COMPARISON:  None. FINDINGS: Gastric catheter is noted within the distal stomach. No free air is seen. No other focal abnormality is noted. IMPRESSION: Gastric catheter within the stomach. Electronically Signed   By: Inez Catalina M.D.   On: 10/01/2020 23:35   EEG adult  Result Date: 10/15/2020 Raenette Rover, MD     09/20/2020  7:28 PM EEG Report Indication: possible seizure activity This study was recorded in the waiting/drowsy state.  The duration of the study was 24 minutes.  Electrodes were placed according to the International 10/20 system.  Video was reviewed/available for clinical correlation as needed. In the waking state, discernible but diminished background organization is seen  with a severely attenuated anterior - posterior voltage and frequency gradient.  In the occipital leads there was a symmetric and reactive although indistinct and poorly sustained posterior dominant rhythm of approximately 6 hertz, which is slower than expected for age  Anteriorly, is the expected pattern of faster frequency, lower voltage waveforms, although in a reduced amount as compared to normal. In addition, there was diffusely seen intermixed low - moderate amplitude Delta and theta slowing with overlying faster frequencies. During the drowsy state, there is further attenuation of the gradient, a general shift to slower frequencies diffusely, a waxing and waning of the posterior dominant rhythm, and slow roving eye movements. Hyperventilation: deferred Photic stimulation: deferred There are no clear focal, paroxysmal or epileptiform abnormalities  or interhemispheric asymmetries. Impression: This is an abnormal waking and drowsy  study due to markedly diminished background organization as described above, most clinically compatible with a mild-moderate diffuse encephalopathy.  There are no clear focal or epileptiform abnormalities.   CT HEAD CODE STROKE WO CONTRAST  Result Date: 10/12/2020 CLINICAL DATA:  Code stroke. Neuro deficit, acute, stroke suspected. Additional history provided: Confusion, last known well 10 p.m. EXAM: CT HEAD WITHOUT CONTRAST TECHNIQUE: Contiguous axial images were obtained from the base of the skull through the vertex without intravenous contrast. COMPARISON:  No pertinent prior exams available for comparison. FINDINGS: Brain: Mild cerebral and cerebellar atrophy. Small chronic appearing cortical infarct within the medial left occipital lobe (for instance as seen on series 3, image 12). Mild patchy and ill-defined hypoattenuation within the cerebral white matter is nonspecific, but compatible chronic small vessel ischemic disease. There is no acute intracranial hemorrhage. No demarcated cortical infarct. No extra-axial fluid collection. No evidence of intracranial mass. No midline shift. Vascular: No hyperdense vessel.  Atherosclerotic calcifications. Skull: Normal. Negative for fracture or focal lesion. Sinuses/Orbits: Visualized orbits show no acute finding. No significant paranasal sinus disease. ASPECTS ([place] Stroke Program Early CT Score) - Ganglionic level infarction (caudate, lentiform nuclei, internal capsule, insula, M1-M3 cortex): 7 - Supraganglionic infarction (M4-M6 cortex): 3 Total score (0-10 with 10 being normal): 10 These results were called by telephone at the time of interpretation on 09/26/2020 at 1:47 pm to provider Dr, Leonel Ramsay, who verbally acknowledged these results. IMPRESSION: No evidence of acute intracranial abnormality.  ASPECTS is 10. 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 Mild generalized parenchymal atrophy and cerebral white matter chronic small vessel  ischemic disease. Electronically Signed   By: Kellie Simmering DO   On: 09/19/2020 13:51   CT ANGIO HEAD NECK W WO CM W PERFUSION  Addendum Date: 10/01/2020   ADDENDUM REPORT: 09/26/2020 16:01 ADDENDUM: The CT perfusion was performed. However, the CT perfusion was stopped due to patient motion and restarted. The quality of the data is suspect. Based on rapid analysis, no evidence of acute infarct or ischemia. Electronically Signed   By: Franchot Gallo M.D.   On: 10/16/2020 16:01   Result Date: 09/25/2020 CLINICAL DATA:  Focal neuro deficit greater than 6 hours. Suspect stroke. Confusion EXAM: CT ANGIOGRAPHY HEAD AND NECK CT PERFUSION HEAD TECHNIQUE: Multidetector CT imaging of the head and neck was performed using the standard protocol during bolus administration of intravenous contrast. Multiplanar CT image reconstructions and MIPs were obtained to evaluate the vascular anatomy. Carotid stenosis measurements (when applicable) are obtained utilizing NASCET criteria, using the distal internal carotid diameter as the denominator. CONTRAST:  135m OMNIPAQUE IOHEXOL 350 MG/ML SOLN COMPARISON:  CT head 09/27/2020 FINDINGS: CT HEAD FINDINGS Following  the CT angiogram: Delayed imaging of the brain obtained. No enhancing lesion identified. Patchy white matter hypodensity again noted. 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 CTA NECK FINDINGS Aortic arch: Standard branching. Imaged portion shows no evidence of aneurysm or dissection. No significant stenosis of the major arch vessel origins. Atherosclerotic calcification aortic arch and proximal great vessels. Right carotid system: Atherosclerotic calcification in the right common carotid artery without stenosis. Atherosclerotic calcification right carotid bifurcation without significant stenosis. Left carotid system: Atherosclerotic calcification left common carotid artery without stenosis. Atherosclerotic calcification left carotid  bifurcation. Approximately 25% diameter stenosis left internal carotid artery. Vertebral arteries: Moderate calcific stenosis origin of right vertebral artery without additional stenosis and patent to the basilar. Mild calcific stenosis origin of left vertebral artery without additional stenosis. Skeleton: Disc and facet degeneration throughout the cervical and thoracic spine. No acute skeletal abnormality. Other neck: Subcentimeter thyroid nodules bilaterally. No further imaging necessary. Upper chest: Lung apices clear bilaterally. 22 mm right lower paratracheal lymph node. Review of the MIP images confirms the above findings CTA HEAD FINDINGS Anterior circulation: Atherosclerotic calcification without stenosis in the cavernous carotid bilaterally. Anterior middle cerebral arteries widely patent without large vessel occlusion. Posterior circulation: Posterior circulation widely patent without stenosis or large vessel occlusion. Venous sinuses: Normal venous enhancement. Anatomic variants: None Review of the MIP images confirms the above findings IMPRESSION: 1. Postcontrast imaging of the brain reveals no enhancing mass lesion. No acute infarct. 2. Atherosclerotic calcification aortic arch and proximal great vessels and carotid artery bilaterally. 3. Atherosclerotic calcification right carotid bifurcation without significant stenosis. Less than 25% diameter stenosis proximal left internal carotid artery 4. Atherosclerotic calcification and stenosis at the origin of the vertebral artery bilaterally right greater than left. 5. No intracranial large vessel occlusion 6. 22 mm right lower paratracheal lymph node. 7. CT perfusion images pending at this time. Electronically Signed: By: Franchot Gallo M.D. On: 10/01/2020 15:18    Assessment: 70 year old female initially presenting from home with worsening confusion after her first dose of chemotherapy with oxaliplatin, leucovorin, fluorouracil, and decadron. Following  admission she coded and was intubated. Current exam reveals an arousable patient with confusion and diffuse weakness.  1. CT head at OSH: No evidence of acute intracranial abnormality.  ASPECTS is 10. 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 Mild generalized parenchymal atrophy and cerebral white matter chronic small vessel ischemic disease. Postcontrast imaging of the brain reveals no enhancing mass lesion. No acute infarct. 2. CTA of head: No intracranial large vessel occlusion 3. CTA of neck: Atherosclerotic calcification aortic arch and proximal great vessels and carotid artery bilaterally. Atherosclerotic calcification right carotid bifurcation without significant stenosis. Less than 25% diameter stenosis proximal left internal carotid artery. Atherosclerotic calcification and stenosis at the origin of the vertebral artery bilaterally right greater than left. 4. CTP compromised by artifact, but no gross evidence of acute infarct or ischemia per Radiology report.  5. Labs: Elevated BUN/Cr with eGFR of 7. Has ESRD and is on HD. Per RN, CCM most likely will start CRRT today. Elevated troponin and BNP. Elevated lactate. Leukocytosis. TSH normal.  5. No alcoholism listed in SHx, but there is a possibility that if she was a drinker, she could have stopped drinking abruptly in anticipation of chemorx or due to the nausea occurring with chemorx.  5. In patients with AMS and vomiting, acute thiamine deficiency should be considered.  6. EEG performed yesterday, prior to the [place]: This  is an abnormal waking and drowsy study due to markedly diminished background organization as described above, most clinically compatible with a mild-moderate diffuse encephalopathy.  There are no clear focal or epileptiform abnormalities. 7. AMS DDx: Acute thiamine deficiency, TIA with aphasia, PRES, EtOH withdrawal and side effects of chemotherapy.   Recommendations: 1. STAT thiamine level; following  blood draw, start high-dose IV thiamine (both ordered) 2. Consider CIWA or a scheduled benzodiazepine in the event that the patient may be withdrawing from EtOH.  3. MRI brain  Electronically signed: Dr. Kerney Elbe  10/14/2020, 7:45 AM

## 2020-10-14 NOTE — Progress Notes (Signed)
Carotid duplex has been completed.   Preliminary results in CV Proc.   Abram Sander 10/14/2020 9:06 AM

## 2020-10-14 NOTE — Progress Notes (Signed)
SLP Cancellation Note  Patient Details Name: [name] MRN: [id number] DOB: 27-May-1951   Cancelled treatment:       Reason Eval/Treat Not Completed: Medical issues which prohibited therapy. Orders received for SLP swallowing and speech/language evaluations; however, pt suffered a V. Fib cardiac arrest on previous date and is now intubated. SLP will f/u for readiness.     Osie Bond., M.A. [place] [facility] Pager [phone] Office [phone]  10/14/2020, 7:19 AM

## 2020-10-14 NOTE — Progress Notes (Signed)
eLink Physician-Brief Progress Note Patient Name: [name] DOB: [birth year] MRN: [id number]   Date of Service  10/14/2020  HPI/Events of Note  Patient ia agitated non-purposefully on the ventilator s/p cardiac arrest. Per bedside RN she has some right chest wall crepitus.  eICU Interventions  Will order Fentanyl gtt, stat portable CXR to r/o pneumothorax.        [name] Naureen Benton 10/14/2020, 3:51 AM

## 2020-10-14 NOTE — Progress Notes (Signed)
Initial Nutrition Assessment  DOCUMENTATION CODES:   Not applicable  INTERVENTION:   Initiate tube feeding via OG tube: Vital 1.5 at 45 ml/h (1080 ml per day) Prosource TF 90 ml TID  Provides 1860 kcal, 139 gm protein, 825 ml free water daily  NUTRITION DIAGNOSIS:   Increased nutrient needs related to chronic illness (ESRD on HD, cancer) as evidenced by estimated needs.  GOAL:   Patient will meet greater than or equal to 90% of their needs  MONITOR:   Vent status,TF tolerance,Labs,Skin  REASON FOR ASSESSMENT:   Ventilator,Consult Enteral/tube feeding initiation and management  ASSESSMENT:   70 yo female admitted with worsening confusion after first round of chemo on 3/25. Suffered VF cardiac arrest 3/25 and was intubated. 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   Received MD Consult for TF initiation and management. OG tube in place.  Patient is currently intubated on ventilator support MV: 10 L/min Temp (24hrs), Avg:97.8 F (36.6 C), Min:97.8 F (36.6 C), Max:97.9 F (36.6 C)   Labs reviewed. Phos 8.2 CBG: 227-242  Medications reviewed and include Levophed, Colace, Novolog, Lantus, Miralax, Senokot, IV thiamine.   EDW 87 kg No plans for CRRT today, but may start tomorrow if patient remains on pressors.   Thiamine level being checked for potential thiamine deficiency being the cause of confusion. Receiving IV thiamine.   NUTRITION - FOCUSED PHYSICAL EXAM:  unable to complete  Diet Order:   Diet Order            Diet NPO time specified  Diet effective now                 EDUCATION NEEDS:   Not appropriate for education at this time  Skin:  Skin Assessment: Reviewed RN Assessment  Last BM:  no BM documented  Height:   Ht Readings from Last 1 Encounters:  10/07/2020 5\' 7"  (1.702 m)     Weight:   Wt Readings from Last 1 Encounters:  10/14/2020 88.6 kg    Ideal Body Weight:  61.4 kg  BMI:  Body mass index is 30.59 kg/m.  Estimated Nutritional Needs:   Kcal:  1650  Protein:  125-150 gm (may be higher if requires CRRT)  Fluid:  1 L + UOP    Lucas Mallow, RD, LDN, CNSC Please refer to Amion for contact information.

## 2020-10-14 NOTE — Progress Notes (Signed)
RT NOTE: ETT pulled back 1cm per MD order.  Patient tolerated well.  Will continue to monitor.

## 2020-10-14 NOTE — Progress Notes (Signed)
NAME:  [name], MRN:  [id number], DOB:  16-Feb-1951, LOS: 0 ADMISSION DATE:  10/02/2020, CONSULTATION DATE:  09/24/2020 REFERRING MD:  Dr. Pietro Cassis TRH, CHIEF COMPLAINT:  Cardiac arrest   History of Present Illness:  70 year old female with PMH significant for ESRD on HD, CAD, DM, and HTN. 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 She is status post hemicolectomy and was just started on palliative chemotherapy 3/24 with FOLFOX and 5-FU. She woke up 3/25 with some confusion, which persisted and worsened throughout the day causing her to present to [facility] ED. In the ED, her speech did not make sense and there was concern for acute stroke. Neuroimaging was unremarkable. Neurology consultation was obtained and further studies were ordered. Etiology remained uncertain. She was admitted to the hospitalist service with neuro workup ongoing and with supportive care for her chronic medical issues. At approximately 2130 she suffered VF cardiac arrest and code blue was called. She underwent approximately 15 minutes of ACLS with eventual ROSC. See code sheet for detailed report, but she did receive defibrillation, amiodarone, epinephrine, magnesium, and sodium bicarb in the midst of resuscitation. Post-code she was transferred to ICU for further care.   Pertinent  Medical History  Renal Cell Carcinoma Adenocarcinoma of Bowel  Anemia  Chronic Renal Failure  Uncontrolled DM with diabetic retinopathy, nephropathy, right foot ulceration HTN Diastolic Dysfunction - grade I, mild LVH, LVEF 40% in 2019 HLD NSVT - 2020  Significant Hospital Events: Including procedures, antibiotic start and stop dates in addition to other pertinent events   . 3/25 Admit for AMS with neuro workup ongoing, suffered VF arrest 15 minutes. L CT placed.  . 3/26 On vent, following commands  Interim History / Subjective:  Remains on vent - 5/40% Levo  10 mcg, fentanyl 100 mcg Left chest tube in place Awake, following commands  Objective   Blood pressure 120/70, pulse 79, temperature 97.9 F (36.6 C), temperature source Oral, resp. rate (!) 22, height 5\' 7"  (1.702 m), weight 88.6 kg, SpO2 98 %.    Vent Mode: PRVC FiO2 (%):  [40 %-100 %] 40 % Set Rate:  [22 bmp] 22 bmp Vt Set:  [490 mL] 490 mL PEEP:  [5 cmH20] 5 cmH20 Plateau Pressure:  [14 cmH20] 14 cmH20   Intake/Output Summary (Last 24 hours) at 10/14/2020 1005 Last data filed at 10/14/2020 0800 Gross per 24 hour  Intake 352.55 ml  Output 90 ml  Net 262.55 ml   Filed Weights   10/16/2020 1355 09/19/2020 2200  Weight: 87 kg 88.6 kg    Examination: General: elderly female lying in bed on vent in NAD, critically ill appearing  HEENT: MM pink/moist, ETT, anicteric  Neuro: opens eyes to voice, nods yes / no to questions, follows commands CV: s1s2 RRR, SR 70's on monitor, no m/r/g PULM: non-labored on vent, faint wheeze on right, sub-Q air heard on left, left chest wall with sub-q air on palpation GI: soft, bsx4 active  Extremities: warm/dry, trace to 1+ edema  Skin: no rashes or lesions on exposed skin  PCXR 3/26 >> images personally reviewed, ETT in good position, right chest port noted, left chest tube in position & difficult to discern if completely resolved PTX due CPR pads, no significant airspace disease.       Resolved Hospital Problem list     Assessment & Plan:   VF cardiac arrest: etiology  not totally clear. VF possibly from prolonged QTC but cannot rule out other causes at this time. Downtime 15 mins. Woke post code / following commands.  -monitor in ICU  -follow cardiac enzymes, elevated but likely demand ischemia / post CPR. Follow to peak  -await ECHO  -hold on full dose anticoagulation for now   Acute hypoxemic respiratory failure secondary to cardiac arrest -PRVC 8cc/kg  -wean PEEP / fiO2 for sats >90% -retract ETT 1 cm  -follow intermittent CXR   -PAD protocol for RASS Goal 0 to -1  -continue fentanyl gtt for pain  -hold home breo  -pulmicort BID -hold LABA with QT concerns  Left Pneumothorax: likely post CPR in origin  -chest tube to 20 cm suction  -chest tube care per protocol  -follow daily CXR while CT in place   Acute encephalopathy: this is the original reason for admission and proceeded cardiac arrest. Neuroimaging unremarkable. Spot EEG without sz. CVA and medication effect considered. Neurology raises concers of PRES from either decadron or bevacizumab. Ammiona/TSH/ETOH wnl.  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 S/p hemicolectomy in Feb. Currently undergoing palliative chemo with FOLFOX and 5-FU.  -hold 5-FU home infusion > may cause arrhythmia & QT prolongation, this was first treatment   Prolonged QTC: -follow QTc  -tele monitoring   ESRD on HD: TTS -Nephrology consult confirmed with Dr. Jonnie Finner 3/26. No acute HD needs currently.  -Trend BMP  -Replace electrolytes as indicated  Anemia of Chronic Disease  -follow CBC   Leukocytosis  -suspect stress response, monitor   DM 2: -CBG monitoring  -change to Q4 SSI, moderate scale  -increase lantus to 20 units QD   Chronic hypotension -hold midodrine for now, uses on HD days -levophed as above    At Risk Malnutrition  -begin TF per protocol    Best practice (right click and "Reselect all SmartList Selections" daily)  Diet:  Tube Feed  Pain/Anxiety/Delirium protocol (if indicated): Yes (RASS goal -1) VAP protocol (if indicated): Yes DVT prophylaxis: Subcutaneous Heparin GI prophylaxis: PPI Glucose control:  SSI Yes and Basal insulin Yes Central venous access:  Yes, and it is still needed Arterial line:  N/A Foley:  N/A Mobility:  bed rest  PT consulted: N/A Last date of multidisciplinary goals of care  discussion [3/25] Code Status:  full code: Has been DNR in the past, and if she does not improve quickly I get the feeling family would not want prolonged life support.  Disposition: ICU  Labs   CBC: Recent Labs  Lab 10/11/20 1506 10/12/2020 1347 09/27/2020 1403 09/25/2020 2153 10/18/2020 2218 10/14/20 0400  WBC 6.6 7.7  --  12.1*  --  15.2*  NEUTROABS 4.6 5.8  --   --   --   --   HGB 9.9* 9.2* 8.2* 11.4* 9.2* 9.9*  HCT 29.7* 27.9* 24.0* 33.9* 27.0* 29.6*  MCV 99.3 100.0  --  100.3*  --  99.3  PLT 162 148*  --  169  --  462    Basic Metabolic Panel: Recent Labs  Lab 10/11/20 1506 09/27/2020 1347 10/12/2020 1403 10/07/2020 2153 10/16/2020 2218 10/14/20 0400  NA 144 139 139 140 138 137  K 4.6 4.4 4.7 4.7 4.5 4.9  CL 98 99 98 101  --  98  CO2 31 28  --  21*  --  24  GLUCOSE  208* 118* 119* 217*  --  265*  BUN 18 21 20 22   --  26*  CREATININE 5.82* 4.84* 5.00* 5.26*  --  5.82*  CALCIUM 9.2 8.9  --  9.2  --  8.9  MG  --   --   --  2.4  --   --   PHOS  --   --   --  6.7*  --   --    GFR: Estimated Creatinine Clearance: 10.3 mL/min (A) (by C-G formula based on SCr of 5.82 mg/dL (H)). Recent Labs  Lab 10/11/20 1506 10/03/2020 1347 09/23/2020 2153 10/14/20 0230 10/14/20 0400  WBC 6.6 7.7 12.1*  --  15.2*  LATICACIDVEN  --   --  9.5* 3.1*  --     Liver Function Tests: Recent Labs  Lab 10/11/20 1506 10/06/2020 1347 10/06/2020 2153  AST 15 16 91*  ALT 11 10 61*  ALKPHOS 66 52 53  BILITOT 0.4 0.3 0.8  PROT 7.2 6.5 5.9*  ALBUMIN 3.3* 3.3* 3.0*   No results for input(s): LIPASE, AMYLASE in the last 168 hours. Recent Labs  Lab 10/05/2020 1720  AMMONIA 31    ABG    Component Value Date/Time   PHART 7.397 09/30/2020 2218   PCO2ART 41.1 10/14/2020 2218   PO2ART 435 (H) 09/21/2020 2218   HCO3 25.3 10/11/2020 2218   TCO2 27 10/06/2020 2218   O2SAT 100.0 10/05/2020 2218     Coagulation Profile: Recent Labs  Lab 09/30/2020 1347  INR 1.0    Cardiac Enzymes: No results for  input(s): CKTOTAL, CKMB, CKMBINDEX, TROPONINI in the last 168 hours.  HbA1C: Hgb A1c MFr Bld  Date/Time Value Ref Range Status  10/05/2020 01:47 PM 6.2 (H) 4.8 - 5.6 % Final    Comment:    (NOTE) Pre diabetes:          5.7%-6.4%  Diabetes:              >6.4%  Glycemic control for   <7.0% adults with diabetes   08/28/2020 12:02 AM 7.8 (H) 4.8 - 5.6 % Final    Comment:    (NOTE) Pre diabetes:          5.7%-6.4%  Diabetes:              >6.4%  Glycemic control for   <7.0% adults with diabetes     CBG: Recent Labs  Lab 10/08/2020 2051 09/23/2020 2256 10/14/20 0105 10/14/20 0326 10/14/20 0739  GLUCAP 178* 222* 257* 227* 242*        Critical care time: 33 minutes    [name], MSN, APRN, NP-C, AGACNP-BC [facility] 10/14/2020, 10:31 AM   Please see [email or web address] for pager details.   From 7A-7P if no response, please call [phone] After hours, please call ELink [phone]

## 2020-10-14 NOTE — Procedures (Signed)
Insertion of Chest Tube Procedure Note  [name]  [id number]  04/04/1951  Date:10/14/20  Time:5:58 AM    Provider Performing: Cicero Duck A Anaira Seay   Procedure: Chest Tube Insertion (38184)  Indication(s) Pneumothorax  Consent Unable to obtain consent due to emergent nature of procedure.  Anesthesia Topical only with 1% lidocaine    Time Out Verified patient identification, verified procedure, site/side was marked, verified correct patient position, special equipment/implants available, medications/allergies/relevant history reviewed, required imaging and test results available.   Sterile Technique Maximal sterile technique including full sterile barrier drape, hand hygiene, sterile gown, sterile gloves, mask, hair covering, sterile ultrasound probe cover (if used).   Procedure Description Ultrasound not used to identify appropriate pleural anatomy for placement and overlying skin marked. Area of placement cleaned and draped in sterile fashion.  A 16 French chest tube was placed into the left pleural space using Seldinger technique. Appropriate return of air was obtained.  The tube was connected to atrium and placed on -20 cm H2O wall suction.   Complications/Tolerance None; patient tolerated the procedure well. Chest X-ray is ordered to verify placement.   EBL Minimal  Specimen(s) none

## 2020-10-14 NOTE — Progress Notes (Signed)
OT Cancellation Note  Patient Details Name: [name] MRN: [id number] DOB: April 02, 1951   Cancelled Treatment:    Reason Eval/Treat Not Completed: Patient not medically ready (Pt requiring CPR last night and now intubated and sedated. OT to continue to follow for OT eval.)   [name], OTR/L Acute Rehabilitation Services Pager: [phone] Office: [phone]   [name] 10/14/2020, 9:43 AM

## 2020-10-14 NOTE — Consult Note (Signed)
ESRD Consult Note  Requesting provider: Freddi Starr, MD  Outpatient dialysis unit: [facility] Outpatient dialysis schedule: TTS  Assessment/Recommendations:   ESRD: Outpatient orders: 3 hrs 45 min, 2K, 2Ca, Na 137, Bicarbonate 35, Dialyzer: F180. UF profile 2 -heparin 5k unit bolus with 1k bolus mid-run -no urgent indications for renal replacement therapy at the moment (not volume overloaded, K and bicarb are stable) but may very well require CRRT by tomorrow. If still on pressors by AM then would recommend a temp line placement and initiation of CRRT then  VF Cardiac Arrest 3/25 (in-hospital arrest) -likely related to qtc prolongation? mgmt per primary  Left Pneumothorax -s/p left chest tube placement, per ccm  Adenocarcinoma of the colon status post hemicolectomy on palliative chemo with FOLFOX and 5-FU (received first treatment on 3/24) -Supportive care at the moment. Has a rt chest port, 5-FU has been removed from port  Shock -pressor support per primary  Volume/ chronic hypotension: EDW 87kg. On midodrine on HD days (holding midodrine since she is on pressors)  Anemia of Chronic Kidney Disease: Hemoglobin 9.9. monitor for now  Secondary Hyperparathyroidism/Hyperphosphatemia: monitor for now (refuses binders as an outpatient, maintained on tums)  Vascular access: weak b/t on lue avf (likely related to shock), temp line plan as above  Uncontrolled DM 2 -Management per primary  # Additional recommendations: - Dose all meds for creatinine clearance < 10 ml/min  - Unless absolutely necessary, no MRIs with gadolinium.  - Implement save arm precautions.  Prefer needle sticks in the dorsum of the hands or wrists.  No blood pressure measurements in arm. - If blood transfusion is requested during hemodialysis sessions, please alert [place] prior to the session.  - If a hemodialysis catheter line culture is requested, please alert [place] as only hemodialysis nurses are able to collect  those specimens.   Recommendations were discussed with the primary team.  Gean Quint, MD [facility]   History of Present Illness: [name] is a 70 y.o. female with a past medical history of ESRD on IHD TTS, CAD, DM, hypertension, history of renal cell carcinoma, recently diagnosed adenocarcinoma of the bowel with mets to peritoneal surface status post hemicolectomy who presents with AMS.  She was originally at [facility] ER but transferred here for IHD needs. Was informed yesterday for routine consult to assist with her scheduled dialysis needs. She had her first round of chemo on 3/24 (FOLFOX, 5-FU).  At approximately 2130, she suffered VF cardiac arrest with 15 minutes of ACLS.  Defibrillation, amnio, epinephrine, magnesium, sodium bicarb were given.  She was also found to have a left pneumothorax post intubation therefore a left chest tube was placed.  She is currently requiring pressor support for shock.  The etiology of her VF arrest is unclear - suspected QTC prolongation.  The remaining 5-FU has been removed from port and discarded per staff. ROS unobtainable-sedated/intubated  Medications:  Current Facility-Administered Medications  Medication Dose Route Frequency Provider Last Rate Last Admin  . acetaminophen (TYLENOL) tablet 650 mg  650 mg Oral Q6H PRN Dahal, Marlowe Aschoff, MD       Or  . acetaminophen (TYLENOL) suppository 650 mg  650 mg Rectal Q6H PRN Dahal, Binaya, MD      . albuterol (PROVENTIL) (2.5 MG/3ML) 0.083% nebulizer solution 2.5 mg  2.5 mg Nebulization Q6H PRN Dahal, Binaya, MD      . budesonide (PULMICORT) nebulizer solution 0.5 mg  0.5 mg Nebulization BID Ollis, Brandi L, NP      .  Chlorhexidine Gluconate Cloth 2 % PADS 6 each  6 each Topical Daily Dahal, Marlowe Aschoff, MD   6 each at 10/14/20 0046  . docusate (COLACE) 50 MG/5ML liquid 100 mg  100 mg Per Tube BID Olalere, Adewale A, MD   100 mg at 10/14/20 0109  . feeding supplement (PROSource TF) liquid 45 mL  45 mL Per  Tube BID Ollis, Brandi L, NP      . feeding supplement (VITAL HIGH PROTEIN) liquid 1,000 mL  1,000 mL Per Tube Q24H Ollis, Brandi L, NP      . fentaNYL (SUBLIMAZE) injection 25-100 mcg  25-100 mcg Intravenous Q30 min PRN Olalere, Adewale A, MD   100 mcg at 10/14/20 0206  . fentaNYL 2561mcg in NS 21mL (40mcg/ml) infusion-PREMIX  0-200 mcg/hr Intravenous Continuous Frederik Pear, MD 10 mL/hr at 10/14/20 0600 100 mcg/hr at 10/14/20 0600  . heparin injection 5,000 Units  5,000 Units Subcutaneous Q8H Terrilee Croak, MD   5,000 Units at 10/14/20 0107  . hydrALAZINE (APRESOLINE) injection 10 mg  10 mg Intravenous Q6H PRN Dahal, Binaya, MD      . insulin aspart (novoLOG) injection 0-15 Units  0-15 Units Subcutaneous Q4H Ollis, Brandi L, NP      . insulin glargine (LANTUS) injection 10 Units  10 Units Subcutaneous Once Freda Jackson B, MD      . insulin glargine (LANTUS) injection 20 Units  20 Units Subcutaneous QHS Ollis, Brandi L, NP      . midazolam (VERSED) injection 1 mg  1 mg Intravenous Q15 min PRN Olalere, Adewale A, MD   1 mg at 10/14/20 0015  . midazolam (VERSED) injection 1 mg  1 mg Intravenous Q2H PRN Olalere, Adewale A, MD   1 mg at 10/15/2020 2344  . norepinephrine (LEVOPHED) 4mg  in 230mL premix infusion  0-40 mcg/min Intravenous Titrated Ogan, Okor[place]wo U, MD 37.5 mL/hr at 10/14/20 0600 10 mcg/min at 10/14/20 0600  . ondansetron (ZOFRAN) injection 4 mg  4 mg Intravenous Q6H PRN Terrilee Croak, MD   4 mg at 09/28/2020 2030  . pantoprazole sodium (PROTONIX) 40 mg/20 mL oral suspension 40 mg  40 mg Per Tube Q1200 Corey Harold, NP      . polyethylene glycol (MIRALAX / GLYCOLAX) packet 17 g  17 g Oral Daily PRN Dahal, Binaya, MD      . polyethylene glycol (MIRALAX / GLYCOLAX) packet 17 g  17 g Per Tube Daily Olalere, Adewale A, MD      . rosuvastatin (CRESTOR) tablet 20 mg  20 mg Per Tube Daily Dahal, Binaya, MD      . senna (SENOKOT) tablet 8.6 mg  1 tablet Per Tube BID Dahal, Binaya, MD       . sodium chloride flush (NS) 0.9 % injection 10-40 mL  10-40 mL Intracatheter Q12H Dahal, Binaya, MD   10 mL at 10/14/20 0030  . sodium chloride flush (NS) 0.9 % injection 10-40 mL  10-40 mL Intracatheter PRN Dahal, Binaya, MD      . thiamine 500mg  in normal saline (21ml) IVPB  500 mg Intravenous TID Kerney Elbe, MD         ALLERGIES Latex, Penicillins, and Adhesive [tape]  MEDICAL HISTORY Past Medical History:  Diagnosis Date  . Anemia   . Anemia in chronic kidney disease 09/29/2018  . Arthritis   . Asthma   . Blood transfusion without reported diagnosis    Phreesia 02/27/2020  . Cataract    OD  . Chronic kidney  disease    Phreesia 02/27/2020  . Coronary artery calcification seen on CAT scan 02/17/2018   Coronary calcification on CT  . Depression   . Diabetic retinopathy of both eyes ([place])   . Diabetic ulcer of right foot associated with type 2 diabetes mellitus ([place])   . ESRD (end stage renal disease) on dialysis ([place])   . Essential hypertension 02/17/2018   Essential hypertension  . HLD (hyperlipidemia)   . Hypertension   . Left ventricular dysfunction 04/07/2018   Left ventricle dysfunction  . Microalbuminuria due to type 2 diabetes mellitus ([place])   . Nonsustained ventricular tachycardia ([place]) 08/28/2018   Nonsustained ventricular tachycardia  . Pneumonia    hx of   . Renal cell cancer ([place])   . Right renal mass 03/10/2017  . Ulcer of other part of foot 06/22/2013  . 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 Marital status: Single    Spouse name: Not on file  . Number of children: 2  . Years of education: 86  . Highest education level: Not on file  Occupational History  . Not on file  Tobacco Use  . Smoking status: Never Smoker  . Smokeless tobacco: Never Used  Vaping Use  . Vaping Use: Never used  Substance and Sexual Activity  . Alcohol use: Yes    Alcohol/week: 0.0  standard drinks    Comment: occ  . Drug use: No  . Sexual activity: Not Currently    Partners: Male  Other Topics Concern  . Not on file  Social History Narrative  . Not on file   Social Determinants of Health   Financial Resource Strain: Low Risk   . Difficulty of Paying Living Expenses: Not very hard  Food Insecurity: Not on file  Transportation Needs: Not on file  Physical Activity: Not on file  Stress: Not on file  Social Connections: Not on file  Intimate Partner Violence: Not on file     FAMILY HISTORY Family History  Problem Relation Age of Onset  . Heart disease Mother   . Diabetes Sister      Review of Systems: Unobtainable-intubated/sedated  Physical Exam: Vitals:   10/14/20 0900 10/14/20 1118  BP: 120/70 (!) 104/59  Pulse:  89  Resp: (!) 22 (!) 22  Temp:    SpO2:  98%   Total I/O In: 94.9 [I.V.:94.9] Out: -   Intake/Output Summary (Last 24 hours) at 10/14/2020 1220 Last data filed at 10/14/2020 0800 Gross per 24 hour  Intake 352.55 ml  Output 90 ml  Net 262.55 ml   General: sedated, ill appearing HEENT: anicteric sclera, MMM CV: normal rate, no murmurs,  Lungs: bilateral chest rise, intubated, left chest tube Abd: soft, non-tender, non-distended Skin: no visible lesions or rashes Msk: rt chest port, lue avf with weak b/t/pulsatility Neuro: sedated  Test Results Reviewed Lab Results  Component Value Date   NA 137 10/14/2020   K 4.9 10/14/2020   CL 98 10/14/2020   CO2 24 10/14/2020   BUN 26 (H) 10/14/2020   CREATININE 5.82 (H) 10/14/2020   CALCIUM 8.9 10/14/2020   ALBUMIN 3.0 (L) 10/11/2020   PHOS 6.7 (H) 10/08/2020    I have reviewed relevant outside healthcare records \

## 2020-10-14 NOTE — Progress Notes (Signed)
  Echocardiogram 2D Echocardiogram has been performed.  [name] 10/14/2020, 10:58 AM

## 2020-10-14 NOTE — Progress Notes (Signed)
PT Cancellation Note  Patient Details Name: [name] MRN: [id number] DOB: 1951-04-16   Cancelled Treatment:    Reason Eval/Treat Not Completed: Medical issues which prohibited therapy. Pt initially admitted for TIA/stroke workup with PT eval ordered. Yesterday evening 3/25, pt suffered v fib cardiac arrest and underwent 20 minutes of CPR. She is now intubated and sedated. PT to follow and proceed with eval when medically ready.   Lorriane Shire 10/14/2020, 8:13 AM   Lorrin Goodell, PT  Office # [phone] Pager [phone]

## 2020-10-15 ENCOUNTER — Inpatient Hospital Stay (HOSPITAL_COMMUNITY): Payer: Medicare Other

## 2020-10-15 ENCOUNTER — Other Ambulatory Visit: Payer: Self-pay

## 2020-10-15 DIAGNOSIS — J9601 Acute respiratory failure with hypoxia: Secondary | ICD-10-CM | POA: Diagnosis not present

## 2020-10-15 DIAGNOSIS — R9431 Abnormal electrocardiogram [ECG] [EKG]: Secondary | ICD-10-CM | POA: Diagnosis not present

## 2020-10-15 DIAGNOSIS — I4901 Ventricular fibrillation: Secondary | ICD-10-CM | POA: Diagnosis not present

## 2020-10-15 DIAGNOSIS — C189 Malignant neoplasm of colon, unspecified: Secondary | ICD-10-CM | POA: Diagnosis not present

## 2020-10-15 DIAGNOSIS — N186 End stage renal disease: Secondary | ICD-10-CM | POA: Diagnosis not present

## 2020-10-15 DIAGNOSIS — Z9911 Dependence on respirator [ventilator] status: Secondary | ICD-10-CM | POA: Diagnosis not present

## 2020-10-15 DIAGNOSIS — R579 Shock, unspecified: Secondary | ICD-10-CM | POA: Diagnosis not present

## 2020-10-15 DIAGNOSIS — G459 Transient cerebral ischemic attack, unspecified: Secondary | ICD-10-CM | POA: Diagnosis not present

## 2020-10-15 LAB — GLUCOSE, CAPILLARY
Glucose-Capillary: 294 mg/dL — ABNORMAL HIGH (ref 70–99)
Glucose-Capillary: 276 mg/dL — ABNORMAL HIGH (ref 70–99)
Glucose-Capillary: 283 mg/dL — ABNORMAL HIGH (ref 70–99)
Glucose-Capillary: 331 mg/dL — ABNORMAL HIGH (ref 70–99)
Glucose-Capillary: 262 mg/dL — ABNORMAL HIGH (ref 70–99)
Glucose-Capillary: 168 mg/dL — ABNORMAL HIGH (ref 70–99)
Glucose-Capillary: 135 mg/dL — ABNORMAL HIGH (ref 70–99)

## 2020-10-15 LAB — POCT I-STAT 7, (LYTES, BLD GAS, ICA,H+H)
Acid-Base Excess: 4 mmol/L — ABNORMAL HIGH (ref 0.0–2.0)
Bicarbonate: 28.2 mmol/L — ABNORMAL HIGH (ref 20.0–28.0)
Calcium, Ion: 1.06 mmol/L — ABNORMAL LOW (ref 1.15–1.40)
HCT: 26 % — ABNORMAL LOW (ref 36.0–46.0)
Hemoglobin: 8.8 g/dL — ABNORMAL LOW (ref 12.0–15.0)
O2 Saturation: 94 %
Patient temperature: 98.3
Potassium: 5.6 mmol/L — ABNORMAL HIGH (ref 3.5–5.1)
Sodium: 135 mmol/L (ref 135–145)
TCO2: 29 mmol/L (ref 22–32)
pCO2 arterial: 41.1 mmHg (ref 32.0–48.0)
pH, Arterial: 7.444 (ref 7.350–7.450)
pO2, Arterial: 69 mmHg — ABNORMAL LOW (ref 83.0–108.0)

## 2020-10-15 LAB — CORTISOL: Cortisol, Plasma: 40.8 ug/dL

## 2020-10-15 LAB — CBC
HCT: 30 % — ABNORMAL LOW (ref 36.0–46.0)
Hemoglobin: 9.9 g/dL — ABNORMAL LOW (ref 12.0–15.0)
MCH: 32.7 pg (ref 26.0–34.0)
MCHC: 33 g/dL (ref 30.0–36.0)
MCV: 99 fL (ref 80.0–100.0)
Platelets: 173 10*3/uL (ref 150–400)
RBC: 3.03 MIL/uL — ABNORMAL LOW (ref 3.87–5.11)
RDW: 16 % — ABNORMAL HIGH (ref 11.5–15.5)
WBC: 12.6 10*3/uL — ABNORMAL HIGH (ref 4.0–10.5)
nRBC: 0 % (ref 0.0–0.2)

## 2020-10-15 LAB — BASIC METABOLIC PANEL
Anion gap: 16 — ABNORMAL HIGH (ref 5–15)
Anion gap: 14 (ref 5–15)
BUN: 37 mg/dL — ABNORMAL HIGH (ref 8–23)
BUN: 46 mg/dL — ABNORMAL HIGH (ref 8–23)
CO2: 22 mmol/L (ref 22–32)
CO2: 26 mmol/L (ref 22–32)
Calcium: 8.1 mg/dL — ABNORMAL LOW (ref 8.9–10.3)
Calcium: 8.2 mg/dL — ABNORMAL LOW (ref 8.9–10.3)
Chloride: 96 mmol/L — ABNORMAL LOW (ref 98–111)
Chloride: 96 mmol/L — ABNORMAL LOW (ref 98–111)
Creatinine, Ser: 6.62 mg/dL — ABNORMAL HIGH (ref 0.44–1.00)
Creatinine, Ser: 7.39 mg/dL — ABNORMAL HIGH (ref 0.44–1.00)
GFR, Estimated: 6 mL/min — ABNORMAL LOW (ref >60)
GFR, Estimated: 5 mL/min — ABNORMAL LOW (ref >60)
Glucose, Bld: 324 mg/dL — ABNORMAL HIGH (ref 70–99)
Glucose, Bld: 295 mg/dL — ABNORMAL HIGH (ref 70–99)
Potassium: 5.4 mmol/L — ABNORMAL HIGH (ref 3.5–5.1)
Potassium: 5.7 mmol/L — ABNORMAL HIGH (ref 3.5–5.1)
Sodium: 134 mmol/L — ABNORMAL LOW (ref 135–145)
Sodium: 136 mmol/L (ref 135–145)

## 2020-10-15 LAB — RENAL FUNCTION PANEL
Albumin: 2.5 g/dL — ABNORMAL LOW (ref 3.5–5.0)
Anion gap: 12 (ref 5–15)
BUN: 44 mg/dL — ABNORMAL HIGH (ref 8–23)
CO2: 27 mmol/L (ref 22–32)
Calcium: 8.1 mg/dL — ABNORMAL LOW (ref 8.9–10.3)
Chloride: 98 mmol/L (ref 98–111)
Creatinine, Ser: 6.8 mg/dL — ABNORMAL HIGH (ref 0.44–1.00)
GFR, Estimated: 6 mL/min — ABNORMAL LOW (ref >60)
Glucose, Bld: 299 mg/dL — ABNORMAL HIGH (ref 70–99)
Phosphorus: 7.6 mg/dL — ABNORMAL HIGH (ref 2.5–4.6)
Potassium: 5.2 mmol/L — ABNORMAL HIGH (ref 3.5–5.1)
Sodium: 137 mmol/L (ref 135–145)

## 2020-10-15 LAB — PHOSPHORUS
Phosphorus: 8.4 mg/dL — ABNORMAL HIGH (ref 2.5–4.6)
Phosphorus: 7.3 mg/dL — ABNORMAL HIGH (ref 2.5–4.6)

## 2020-10-15 LAB — TROPONIN I (HIGH SENSITIVITY): Troponin I (High Sensitivity): 430 ng/L (ref <18)

## 2020-10-15 LAB — LACTIC ACID, PLASMA: Lactic Acid, Venous: 2.4 mmol/L (ref 0.5–1.9)

## 2020-10-15 LAB — MAGNESIUM
Magnesium: 1.9 mg/dL (ref 1.7–2.4)
Magnesium: 2.5 mg/dL — ABNORMAL HIGH (ref 1.7–2.4)

## 2020-10-15 MED ORDER — ASPIRIN 300 MG RE SUPP
300.0000 mg | Freq: Every day | RECTAL | Status: DC
  Filled 2020-10-15: qty 1

## 2020-10-15 MED ORDER — CLOPIDOGREL BISULFATE 75 MG PO TABS
75.0000 mg | ORAL_TABLET | Freq: Every day | ORAL | Status: DC
  Administered 2020-10-16: 75 mg
  Filled 2020-10-15: qty 1

## 2020-10-15 MED ORDER — PRISMASOL BGK 0/2.5 32-2.5 MEQ/L EC SOLN
Status: DC
  Filled 2020-10-15 (×18): qty 5000

## 2020-10-15 MED ORDER — SODIUM CHLORIDE 0.9 % IV SOLN
2.0000 g | Freq: Two times a day (BID) | INTRAVENOUS | Status: DC
  Administered 2020-10-15 – 2020-10-16 (×4): 2 g via INTRAVENOUS
  Filled 2020-10-15 (×4): qty 2

## 2020-10-15 MED ORDER — POLYETHYLENE GLYCOL 3350 17 G PO PACK: 17.0000 g | PACK | Freq: Every day | ORAL | Status: DC | PRN

## 2020-10-15 MED ORDER — INSULIN ASPART 100 UNIT/ML ~~LOC~~ SOLN
0.0000 [IU] | SUBCUTANEOUS | Status: DC
  Administered 2020-10-15 (×2): 11 [IU] via SUBCUTANEOUS
  Administered 2020-10-15: 4 [IU] via SUBCUTANEOUS
  Administered 2020-10-16: 3 [IU] via SUBCUTANEOUS
  Administered 2020-10-16: 7 [IU] via SUBCUTANEOUS
  Administered 2020-10-16: 20 [IU] via SUBCUTANEOUS
  Administered 2020-10-16: 15 [IU] via SUBCUTANEOUS

## 2020-10-15 MED ORDER — ACETAMINOPHEN 650 MG RE SUPP: 650.0000 mg | Freq: Four times a day (QID) | RECTAL | Status: DC | PRN

## 2020-10-15 MED ORDER — VANCOMYCIN HCL 750 MG/150ML IV SOLN
750.0000 mg | INTRAVENOUS | Status: DC
  Administered 2020-10-15: 750 mg via INTRAVENOUS
  Filled 2020-10-15 (×2): qty 150

## 2020-10-15 MED ORDER — INSULIN GLARGINE 100 UNIT/ML ~~LOC~~ SOLN
10.0000 [IU] | Freq: Once | SUBCUTANEOUS | Status: AC
  Administered 2020-10-15: 10 [IU] via SUBCUTANEOUS
  Filled 2020-10-15: qty 0.1

## 2020-10-15 MED ORDER — ACETAMINOPHEN 325 MG PO TABS: 650.0000 mg | ORAL_TABLET | Freq: Four times a day (QID) | ORAL | Status: DC | PRN

## 2020-10-15 MED ORDER — ASPIRIN 81 MG PO CHEW
81.0000 mg | CHEWABLE_TABLET | Freq: Every day | ORAL | Status: DC
  Administered 2020-10-16: 81 mg
  Filled 2020-10-15: qty 1

## 2020-10-15 MED ORDER — AMIODARONE HCL IN DEXTROSE 360-4.14 MG/200ML-% IV SOLN
60.0000 mg/h | INTRAVENOUS | Status: AC
  Administered 2020-10-15 (×2): 60 mg/h via INTRAVENOUS
  Filled 2020-10-15: qty 200

## 2020-10-15 MED ORDER — MIDAZOLAM HCL 2 MG/2ML IJ SOLN
2.0000 mg | Freq: Once | INTRAMUSCULAR | Status: AC
  Administered 2020-10-15: 2 mg via INTRAVENOUS

## 2020-10-15 MED ORDER — DEXTROSE 50 % IV SOLN
INTRAVENOUS | Status: AC
  Filled 2020-10-15: qty 50

## 2020-10-15 MED ORDER — ASPIRIN 81 MG PO CHEW
81.0000 mg | CHEWABLE_TABLET | Freq: Every day | ORAL | Status: DC
  Administered 2020-10-15: 81 mg via ORAL
  Filled 2020-10-15: qty 1

## 2020-10-15 MED ORDER — AMIODARONE LOAD VIA INFUSION
150.0000 mg | Freq: Once | INTRAVENOUS | Status: AC
  Administered 2020-10-15: 150 mg via INTRAVENOUS
  Filled 2020-10-15: qty 83.34

## 2020-10-15 MED ORDER — VASOPRESSIN 20 UNITS/100 ML INFUSION FOR SHOCK
0.0000 [IU]/min | INTRAVENOUS | Status: DC
  Administered 2020-10-15 – 2020-10-17 (×5): 0.03 [IU]/min via INTRAVENOUS
  Filled 2020-10-15 (×5): qty 100

## 2020-10-15 MED ORDER — PRISMASOL BGK 0/2.5 32-2.5 MEQ/L EC SOLN
Status: DC
  Filled 2020-10-15 (×4): qty 5000

## 2020-10-15 MED ORDER — CLOPIDOGREL BISULFATE 75 MG PO TABS
75.0000 mg | ORAL_TABLET | Freq: Every day | ORAL | Status: DC
  Administered 2020-10-15: 75 mg via ORAL
  Filled 2020-10-15: qty 1

## 2020-10-15 MED ORDER — INSULIN ASPART 100 UNIT/ML ~~LOC~~ SOLN
4.0000 [IU] | SUBCUTANEOUS | Status: DC
  Administered 2020-10-15 – 2020-10-16 (×8): 4 [IU] via SUBCUTANEOUS

## 2020-10-15 MED ORDER — INSULIN GLARGINE 100 UNIT/ML ~~LOC~~ SOLN
30.0000 [IU] | Freq: Every day | SUBCUTANEOUS | Status: DC
  Administered 2020-10-15: 30 [IU] via SUBCUTANEOUS
  Filled 2020-10-15 (×2): qty 0.3

## 2020-10-15 MED ORDER — CHLORHEXIDINE GLUCONATE 0.12 % MT SOLN
OROMUCOSAL | Status: AC
  Administered 2020-10-15: 15 mL via OROMUCOSAL
  Filled 2020-10-15: qty 15

## 2020-10-15 MED ORDER — PRISMASOL BGK 0/2.5 32-2.5 MEQ/L EC SOLN
Status: DC
  Filled 2020-10-15 (×6): qty 5000

## 2020-10-15 MED ORDER — AMIODARONE HCL IN DEXTROSE 360-4.14 MG/200ML-% IV SOLN
30.0000 mg/h | INTRAVENOUS | Status: DC
  Administered 2020-10-15 – 2020-10-16 (×2): 30 mg/h via INTRAVENOUS
  Filled 2020-10-15 (×3): qty 200

## 2020-10-15 MED ORDER — LIDOCAINE IN D5W 4-5 MG/ML-% IV SOLN: 1.0000 mg/min | INTRAVENOUS | Status: DC

## 2020-10-15 MED ORDER — HEPARIN SODIUM (PORCINE) 1000 UNIT/ML DIALYSIS
1000.0000 [IU] | INTRAMUSCULAR | Status: DC | PRN
  Administered 2020-10-15 – 2020-10-16 (×2): 2800 [IU] via INTRAVENOUS_CENTRAL
  Filled 2020-10-15: qty 3
  Filled 2020-10-15 (×3): qty 6
  Filled 2020-10-15: qty 3

## 2020-10-15 NOTE — Progress Notes (Signed)
During emergency situation, Dr Erin Fulling verbally ordered 2 mg of midazolam before we planned to provide a defib shock to patient. I pulled out under the patient's current midazolam order which is currently 1 mg.  I gave midaz 2 mg to Annabell Sabal RN and he administered entire vial.  Pyxis asking for waste but there was none - a one time order of midaz 2 mg was entered after this occurrence to document entire dose  Barth Kirks, PharmD, [facility] Clinical Pharmacist [phone]  Please check AMION for all [place] numbers  10/15/2020 2:03 PM

## 2020-10-15 NOTE — Progress Notes (Signed)
[facility] Progress Note    Assessment/ Plan:   ESRD: Outpatient orders: [facility]. 3 hrs 45 min, 2K, 2Ca, Na 137, Bicarbonate 35, Dialyzer: F180. UF profile 2. heparin 5k unit bolus with 1k bolus mid-run -appreciate CCM's assistance with temp line placement 3/27. -Starting CVVHDF 3/27, discussed with staff and team. Keep net even for the first few hours and if hemodynamically stable can aim for a goal of net neg 50-100cc/hr.  VF Cardiac Arrest 3/25 (in-hospital arrest) -likely related to qtc prolongation? mgmt per primary  Left Pneumothorax, now with worsening subq emyphsema -s/p left chest tube placement, per ccm. Another chest tube insertion today  Adenocarcinoma of the colon status post hemicolectomy on palliative chemo with FOLFOX and 5-FU (received first treatment on 3/24) -Supportive care at the moment. Has a rt chest port, 5-FU has been removed from port  Shock -pressor support per primary  Volume/ chronic hypotension: EDW 87kg. On midodrine on HD days (holding midodrine since she is on pressors)  Anemia of Chronic Kidney Disease: Hemoglobin 9.9. monitor for now  Secondary Hyperparathyroidism/Hyperphosphatemia: monitor for now (refuses binders as an outpatient, maintained on tums). Phos should improve with the initiation of CRRT  Vascular access: weak b/t on lue avf (likely related to shock) on 3/26, temp line plan as above  Uncontrolled DM 2 -Management per primary  # Additional recommendations: - Dose all meds for creatinine clearance <10 ml/min  - Unless absolutely necessary, no MRIs with gadolinium.  - Implement save arm precautions. Prefer needle sticks in the dorsum of the hands or wrists. No blood pressure measurements in arm. - If blood transfusion is requested during hemodialysis sessions, please alert [place] prior to the session.  - If a hemodialysis catheter line culture is requested, please alert [place] as only hemodialysis nurses  are able to collect those specimens.   Recommendations were discussed with the primary team.  Gean Quint, MD [facility]   Subjective:   Increasing levo requirements. Worsening subq emphysema on cxr, another chest tube planned. Planning on starting cvvhdf as prev planned.    Objective:   BP (!) 102/27 (BP Location: Left Arm)   Pulse 96   Temp 98.2 F (36.8 C) (Oral)   Resp (!) 22   Ht 5' 7" (1.702 m)   Wt 91.1 kg   SpO2 93%   BMI 31.46 kg/m   Intake/Output Summary (Last 24 hours) at 10/15/2020 9702 Last data filed at 10/15/2020 0800 Gross per 24 hour  Intake 2595.04 ml  Output 80 ml  Net 2515.04 ml   Weight change: 4.1 kg  Physical Exam (limited exam given that she is undergoing procedures): Gen:ill appearing CVS:reg rate Resp: intubated, left chest wall swelling, +chest tube left OVZ:CHYIF, soft Ext:+edema bl le's Neuro: sedated Dialysis access: lue avf  Imaging: MR BRAIN WO CONTRAST  Result Date: 10/14/2020 CLINICAL DATA:  TIA EXAM: MRI HEAD WITHOUT CONTRAST TECHNIQUE: Multiplanar, multiecho pulse sequences of the brain and surrounding structures were obtained without intravenous contrast. COMPARISON:  None. FINDINGS: Brain: Cortical/subcortical reduced diffusion in the left parietal and occipital lobes. No evidence of hemorrhage. There is no intracranial mass or significant mass effect. Patchy foci of T2 hyperintensity in the supratentorial and pontine white matter are nonspecific may reflect mild to moderate chronic microvascular ischemic changes. Prominence of the ventricles and sulci reflects generalized parenchymal volume loss. Chronic parasagittal left occipital infarct. Small chronic right middle frontal gyrus infarct. There is no hydrocephalus or extra-axial fluid collection. Vascular: Major  vessel flow voids at the skull base are preserved. Skull and upper cervical spine: Normal marrow signal is preserved. Sinuses/Orbits: Minor mucosal  thickening.  Orbits are unremarkable. Other: Sella is unremarkable.  Mastoid air cells are clear. IMPRESSION: Acute cortical/subcortical infarcts of the left parietal and occipital lobes. No hemorrhage. Small chronic left occipital and right frontal infarcts. Chronic microvascular ischemic changes. Electronically Signed   By: Macy Mis M.D.   On: 10/14/2020 13:55   DG CHEST PORT 1 VIEW  Result Date: 10/15/2020 CLINICAL DATA:  Acute respiratory failure with hypoxia EXAM: PORTABLE CHEST 1 VIEW COMPARISON:  10/14/2020 FINDINGS: Indwelling left chest tube. Moderate left pneumothorax, progressive. Extensive subcutaneous emphysema predominantly over the left chest wall, progressive. Patchy bilateral lower lobe opacities, likely atelectasis. No pleural effusions. Endotracheal tube terminates 4.8 cm above the carina. Right chest power port terminates the cavoatrial junction. Enteric tube courses into the stomach. The heart is normal in size. IMPRESSION: Moderate left pneumothorax, progressive. Indwelling left chest tube. Extensive subcutaneous emphysema, progressive. These results will be called to the ordering clinician or representative by the Radiologist Assistant, and communication documented in the PACS or [employer]. Electronically Signed   By: Julian Hy M.D.   On: 10/15/2020 05:43   DG CHEST PORT 1 VIEW  Result Date: 10/14/2020 CLINICAL DATA:  Chest tube placement EXAM: PORTABLE CHEST 1 VIEW COMPARISON:  10/14/2020 at 0417 hours FINDINGS: Indwelling left chest tube. A persistent trace left pneumothorax may be obscured by the overlying defibrillator pad. Right lung is essentially clear.  No definite pleural effusion. Endotracheal tube terminates 10 mm above the carina. Enteric tube courses into the proximal stomach. Right chest power port terminates at the cavoatrial junction. Mild cardiomegaly. IMPRESSION: Indwelling left chest tube. A persistent trace left pneumothorax may be obscured by  the overlying defibrillator pad. Endotracheal tube terminates 10 mm above the carina. Additional support apparatus as above. Electronically Signed   By: Julian Hy M.D.   On: 10/14/2020 06:18   DG Chest Port 1 View  Result Date: 10/14/2020 CLINICAL DATA:  Acute respiratory failure EXAM: PORTABLE CHEST 1 VIEW COMPARISON:  Yesterday FINDINGS: Endotracheal tube with tip 7 mm above the carina. The enteric tube tip is at the distal stomach or proximal duodenum. Right port with tip at the upper cavoatrial junction. Moderate left pneumothorax which could be up to 50%. There is superimposed pulmonary and pleural density. New chest wall emphysema. Critical Value/emergent results were called by telephone at the time of interpretation on 10/14/2020 at 4:49 am to provider Trevor Mace , who verbally acknowledged these results. IMPRESSION: 1. New moderate left pneumothorax and chest wall emphysema. 2. Worsening left pulmonary aeration with probable pleural fluid. 3. Low endotracheal tube with tip 7 mm above the carina. The enteric tube tip at least reaches the pylorus. Electronically Signed   By: Monte Fantasia M.D.   On: 10/14/2020 04:50   DG CHEST PORT 1 VIEW  Result Date: 10/06/2020 CLINICAL DATA:  Respiratory failure, history of colon cancer EXAM: PORTABLE CHEST 1 VIEW COMPARISON:  10/05/2020 at 1:45 p.m. FINDINGS: Single frontal view of the chest demonstrates endotracheal tube overlying tracheal air column, tip approximately 1.5 cm above carina. External defibrillator pads overlie the cardiac silhouette. Right chest wall port via internal jugular approach unchanged. Cardiac silhouette is stable. No airspace disease, effusion, or pneumothorax. IMPRESSION: 1. No complication after intubation. No acute intrathoracic process. Electronically Signed   By: Randa Ngo M.D.   On: 09/29/2020 22:15   DG  Chest Port 1 View  Result Date: 10/08/2020 CLINICAL DATA:  Weakness.  Code stroke. EXAM: PORTABLE CHEST 1  VIEW COMPARISON:  11/28/2012 FINDINGS: Aortic atherosclerosis. There is a right chest wall port a catheter with tip in the right atrium. No pleural effusion or edema. No airspace densities. The visualized osseous structures are unremarkable. IMPRESSION: No acute cardiopulmonary abnormalities. Electronically Signed   By: Kerby Moors M.D.   On: 10/09/2020 14:19   DG Abd Portable 1V  Result Date: 09/20/2020 CLINICAL DATA:  Check gastric catheter placement EXAM: PORTABLE ABDOMEN - 1 VIEW COMPARISON:  None. FINDINGS: Gastric catheter is noted within the distal stomach. No free air is seen. No other focal abnormality is noted. IMPRESSION: Gastric catheter within the stomach. Electronically Signed   By: Inez Catalina M.D.   On: 09/24/2020 23:35   EEG adult  Result Date: 09/19/2020 Raenette Rover, MD     10/12/2020  7:28 PM EEG Report Indication: possible seizure activity This study was recorded in the waiting/drowsy state.  The duration of the study was 24 minutes.  Electrodes were placed according to the International 10/20 system.  Video was reviewed/available for clinical correlation as needed. In the waking state, discernible but diminished background organization is seen with a severely attenuated anterior - posterior voltage and frequency gradient.  In the occipital leads there was a symmetric and reactive although indistinct and poorly sustained posterior dominant rhythm of approximately 6 hertz, which is slower than expected for age  Anteriorly, is the expected pattern of faster frequency, lower voltage waveforms, although in a reduced amount as compared to normal. In addition, there was diffusely seen intermixed low - moderate amplitude Delta and theta slowing with overlying faster frequencies. During the drowsy state, there is further attenuation of the gradient, a general shift to slower frequencies diffusely, a waxing and waning of the posterior dominant rhythm, and slow roving eye movements.  Hyperventilation: deferred Photic stimulation: deferred There are no clear focal, paroxysmal or epileptiform abnormalities  or interhemispheric asymmetries. Impression: This is an abnormal waking and drowsy study due to markedly diminished background organization as described above, most clinically compatible with a mild-moderate diffuse encephalopathy.  There are no clear focal or epileptiform abnormalities.   CT HEAD CODE STROKE WO CONTRAST  Result Date: 09/24/2020 CLINICAL DATA:  Code stroke. Neuro deficit, acute, stroke suspected. Additional history provided: Confusion, last known well 10 p.m. EXAM: CT HEAD WITHOUT CONTRAST TECHNIQUE: Contiguous axial images were obtained from the base of the skull through the vertex without intravenous contrast. COMPARISON:  No pertinent prior exams available for comparison. FINDINGS: Brain: Mild cerebral and cerebellar atrophy. Small chronic appearing cortical infarct within the medial left occipital lobe (for instance as seen on series 3, image 12). Mild patchy and ill-defined hypoattenuation within the cerebral white matter is nonspecific, but compatible chronic small vessel ischemic disease. There is no acute intracranial hemorrhage. No demarcated cortical infarct. No extra-axial fluid collection. No evidence of intracranial mass. No midline shift. Vascular: No hyperdense vessel.  Atherosclerotic calcifications. Skull: Normal. Negative for fracture or focal lesion. Sinuses/Orbits: Visualized orbits show no acute finding. No significant paranasal sinus disease. ASPECTS ([place] Stroke Program Early CT Score) - Ganglionic level infarction (caudate, lentiform nuclei, internal capsule, insula, M1-M3 cortex): 7 - Supraganglionic infarction (M4-M6 cortex): 3 Total score (0-10 with 10 being normal): 10 These results were called by telephone at the time of interpretation on 10/01/2020 at 1:47 pm to provider Dr, Leonel Ramsay, who verbally acknowledged these results. IMPRESSION: No  evidence of acute intracranial abnormality.  ASPECTS is 10. 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 Mild generalized parenchymal atrophy and cerebral white matter chronic small vessel ischemic disease. Electronically Signed   By: Kellie Simmering DO   On: 09/26/2020 13:51   VAS US CAROTID  Result Date: 10/14/2020 Carotid Arterial Duplex Study Indications:       CVA. Risk Factors:      Hypertension, hyperlipidemia, Diabetes, coronary artery                    disease. Limitations        Today's exam was limited due to patient on a ventilator and                    patient positioning. Comparison Study:  07/13/18 previous Performing Technologist: Abram Sander RVS  Examination Guidelines: A complete evaluation includes B-mode imaging, spectral Doppler, color Doppler, and power Doppler as needed of all accessible portions of each vessel. Bilateral testing is considered an integral part of a complete examination. Limited examinations for reoccurring indications may be performed as noted.  Right Carotid Findings: +----------+--------+--------+--------+------------------+---------------------+           PSV cm/sEDV cm/sStenosisPlaque DescriptionComments              +----------+--------+--------+--------+------------------+---------------------+ CCA Prox  111     36              heterogenous                            +----------+--------+--------+--------+------------------+---------------------+ CCA Distal68      23              heterogenous                            +----------+--------+--------+--------+------------------+---------------------+ ICA Prox  67      27      1-39%   heterogenous      limited visualization                                                     due to patient                                                            positioning           +----------+--------+--------+--------+------------------+---------------------+ ICA Mid                                              Not visualized        +----------+--------+--------+--------+------------------+---------------------+ ECA       88      14                                                      +----------+--------+--------+--------+------------------+---------------------+ +----------+--------+-------+--------+-------------------+  PSV cm/sEDV cmsDescribeArm Pressure (mmHG) +----------+--------+-------+--------+-------------------+ [id number]                                         +----------+--------+-------+--------+-------------------+ +---------+--------+--------+--------------+ VertebralPSV cm/sEDV cm/sNot identified +---------+--------+--------+--------------+  Left Carotid Findings: +----------+--------+--------+--------+------------------+--------+           PSV cm/sEDV cm/sStenosisPlaque DescriptionComments +----------+--------+--------+--------+------------------+--------+ CCA Prox  103     33              heterogenous               +----------+--------+--------+--------+------------------+--------+ CCA Distal82      33              heterogenous               +----------+--------+--------+--------+------------------+--------+ ICA Prox  84      32      1-39%   heterogenous               +----------+--------+--------+--------+------------------+--------+ ICA Distal95      43                                         +----------+--------+--------+--------+------------------+--------+ ECA       71                                                 +----------+--------+--------+--------+------------------+--------+ +----------+--------+--------+--------+-------------------+           PSV cm/sEDV cm/sDescribeArm Pressure (mmHG) +----------+--------+--------+--------+-------------------+ [id number]                                          +----------+--------+--------+--------+-------------------+  +---------+--------+--+--------+--+---------+ VertebralPSV cm/s78EDV cm/s36Antegrade +---------+--------+--+--------+--+---------+   Summary: Right Carotid: Velocities in the right ICA are consistent with a 1-39% stenosis. Left Carotid: Velocities in the left ICA are consistent with a 1-39% stenosis. Vertebrals: Bilateral vertebral arteries demonstrate antegrade flow. *See table(s) above for measurements and observations.     Preliminary    ECHOCARDIOGRAM LIMITED  Result Date: 10/14/2020    ECHOCARDIOGRAM REPORT   Patient Name:   [name] Date of Exam: 10/14/2020 Medical Rec #:  [id number]     Height:       67.0 in Accession #:    [id number]    Weight:       195.3 lb Date of Birth:  Jan 13, 1951     BSA:          2.002 m Patient Age:    70 years      BP:           120/70 mmHg Patient Gender: F             HR:           81 bpm. Exam Location:  Inpatient Procedure: Limited Echo, Color Doppler and Cardiac Doppler Indications:    stroke  History:        Patient has prior history of Echocardiogram examinations, most                 recent 03/02/2018. Cancer. end stage renal disease.,  Arrythmias:Ventricular Fibrillation; Risk Factors:Hypertension.  Sonographer:    Johny Chess Referring Phys: [id number] [facility]  Sonographer Comments: Technically difficult study due to poor echo windows, Technically challenging study due to limited acoustic windows, suboptimal parasternal window and echo performed with patient supine and on artificial respirator. Image acquisition challenging due to patient body habitus and Image acquisition challenging due to respiratory motion. IMPRESSIONS  1. Extremely limited; LV function ? preserved; no pericardial effusion; no other information could be obtained with this study; suggest TEE if clinically indicated.  2. Left ventricular ejection fraction, by estimation, is Cannot be assessed%. The left ventricle has Cannot be assessed function. Left ventricular  endocardial border not optimally defined to evaluate regional wall motion. The left ventricular internal cavity size was Not assessed. There is Not assessed left ventricular hypertrophy. Left ventricular diastolic function could not be evaluated.  3. Right ventricular systolic function is normal. The right ventricular size is normal.  4. The mitral valve was not assessed. Not fully assessed mitral valve regurgitation.  5. Tricuspid valve regurgitation Not fully assessed.  6. The aortic valve was not assessed. Aortic valve regurgitation Not assessed.  7. Pulmonic valve regurgitation Not assessed. FINDINGS  Left Ventricle: Left ventricular ejection fraction, by estimation, is Cannot be assessed%. The left ventricle has Cannot be assessed function. Left ventricular endocardial border not optimally defined to evaluate regional wall motion. The left ventricular internal cavity size was Not assessed. There is Not assessed left ventricular hypertrophy. Left ventricular diastolic function could not be evaluated. Right Ventricle: The right ventricular size is normal. Right vetricular wall thickness was not assessed. Right ventricular systolic function is normal. Left Atrium: Left atrial size was not assessed. Right Atrium: Right atrial size was not assessed. Pericardium: There is no evidence of pericardial effusion. Mitral Valve: The mitral valve was not assessed. Not fully assessed mitral valve regurgitation. Tricuspid Valve: The tricuspid valve is not assessed. Tricuspid valve regurgitation Not fully assessed. No evidence of tricuspid stenosis. Aortic Valve: The aortic valve was not assessed. Aortic valve regurgitation Not assessed. Pulmonic Valve: The pulmonic valve was not assessed. Pulmonic valve regurgitation Not assessed. Aorta: Aortic root could not be assessed. Venous: The inferior vena cava was not well visualized.  Additional Comments: Extremely limited; LV function ? preserved; no pericardial effusion; no other  information could be obtained with this study; suggest TEE if clinically indicated.  MV E velocity: 54.40 cm/s MV A velocity: 83.10 cm/s MV E/A ratio:  0.65 Kirk Ruths MD Electronically signed by Kirk Ruths MD Signature Date/Time: 10/14/2020/2:08:03 PM    Final    CT ANGIO HEAD NECK W WO CM W PERFUSION  Addendum Date: 09/25/2020   ADDENDUM REPORT: 09/24/2020 16:01 ADDENDUM: The CT perfusion was performed. However, the CT perfusion was stopped due to patient motion and restarted. The quality of the data is suspect. Based on rapid analysis, no evidence of acute infarct or ischemia. Electronically Signed   By: Franchot Gallo M.D.   On: 10/04/2020 16:01   Result Date: 09/26/2020 CLINICAL DATA:  Focal neuro deficit greater than 6 hours. Suspect stroke. Confusion EXAM: CT ANGIOGRAPHY HEAD AND NECK CT PERFUSION HEAD TECHNIQUE: Multidetector CT imaging of the head and neck was performed using the standard protocol during bolus administration of intravenous contrast. Multiplanar CT image reconstructions and MIPs were obtained to evaluate the vascular anatomy. Carotid stenosis measurements (when applicable) are obtained utilizing NASCET criteria, using the distal internal carotid diameter as the denominator. CONTRAST:  117m OMNIPAQUE IOHEXOL 350 MG/ML  SOLN COMPARISON:  CT head 09/19/2020 FINDINGS: CT HEAD FINDINGS Following the CT angiogram: Delayed imaging of the brain obtained. No enhancing lesion identified. Patchy white matter hypodensity again noted. 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 CTA NECK FINDINGS Aortic arch: Standard branching. Imaged portion shows no evidence of aneurysm or dissection. No significant stenosis of the major arch vessel origins. Atherosclerotic calcification aortic arch and proximal great vessels. Right carotid system: Atherosclerotic calcification in the right common carotid artery without stenosis. Atherosclerotic calcification right carotid  bifurcation without significant stenosis. Left carotid system: Atherosclerotic calcification left common carotid artery without stenosis. Atherosclerotic calcification left carotid bifurcation. Approximately 25% diameter stenosis left internal carotid artery. Vertebral arteries: Moderate calcific stenosis origin of right vertebral artery without additional stenosis and patent to the basilar. Mild calcific stenosis origin of left vertebral artery without additional stenosis. Skeleton: Disc and facet degeneration throughout the cervical and thoracic spine. No acute skeletal abnormality. Other neck: Subcentimeter thyroid nodules bilaterally. No further imaging necessary. Upper chest: Lung apices clear bilaterally. 22 mm right lower paratracheal lymph node. Review of the MIP images confirms the above findings CTA HEAD FINDINGS Anterior circulation: Atherosclerotic calcification without stenosis in the cavernous carotid bilaterally. Anterior middle cerebral arteries widely patent without large vessel occlusion. Posterior circulation: Posterior circulation widely patent without stenosis or large vessel occlusion. Venous sinuses: Normal venous enhancement. Anatomic variants: None Review of the MIP images confirms the above findings IMPRESSION: 1. Postcontrast imaging of the brain reveals no enhancing mass lesion. No acute infarct. 2. Atherosclerotic calcification aortic arch and proximal great vessels and carotid artery bilaterally. 3. Atherosclerotic calcification right carotid bifurcation without significant stenosis. Less than 25% diameter stenosis proximal left internal carotid artery 4. Atherosclerotic calcification and stenosis at the origin of the vertebral artery bilaterally right greater than left. 5. No intracranial large vessel occlusion 6. 22 mm right lower paratracheal lymph node. 7. CT perfusion images pending at this time. Electronically Signed: By: Franchot Gallo M.D. On: 09/23/2020 15:18     Labs: BMET Recent Labs  Lab 10/11/20 1506 10/06/2020 1347 10/07/2020 1403 09/26/2020 2153 09/26/2020 2218 10/14/20 0400 10/14/20 1124 10/14/20 2045 10/15/20 0325  NA 144 139 139 140 138 137  --   --  134*  K 4.6 4.4 4.7 4.7 4.5 4.9  --   --  5.4*  CL 98 99 98 101  --  98  --   --  96*  CO2 31 28  --  21*  --  24  --   --  22  GLUCOSE 208* 118* 119* 217*  --  265*  --   --  324*  BUN _0 --  26*  --   --  37*  CREATININE 5.82* 4.84* 5.00* 5.26*  --  5.82*  --   --  6.62*  CALCIUM 9.2 8.9  --  9.2  --  8.9  --   --  8.1*  PHOS  --   --   --  6.7*  --   --  8.2* 8.7* 8.4*   CBC Recent Labs  Lab 10/11/20 1506 10/14/2020 1347 10/15/2020 1403 10/14/2020 2153 10/07/2020 2218 10/14/20 0400 10/15/20 0325  WBC 6.6 7.7  --  12.1*  --  15.2* 12.6*  NEUTROABS 4.6 5.8  --   --   --   --   --   HGB 9.9* 9.2*   < > 11.4* 9.2* 9.9* 9.9*  HCT 29.7* 27.9*   < >  33.9* 27.0* 29.6* 30.0*  MCV 99.3 100.0  --  100.3*  --  99.3 99.0  PLT 162 148*  --  169  --  259 173   < > = values in this interval not displayed.    Medications:    . budesonide (PULMICORT) nebulizer solution  0.5 mg Nebulization BID  . chlorhexidine gluconate (MEDLINE KIT)  15 mL Mouth Rinse BID  . Chlorhexidine Gluconate Cloth  6 each Topical Daily  . docusate  100 mg Per Tube BID  . feeding supplement (PROSource TF)  90 mL Per Tube TID  . heparin  5,000 Units Subcutaneous Q8H  . insulin aspart  0-20 Units Subcutaneous Q4H  . insulin aspart  4 Units Subcutaneous Q4H  . insulin glargine  10 Units Subcutaneous Once  . insulin glargine  30 Units Subcutaneous QHS  . mouth rinse  15 mL Mouth Rinse 10 times per day  . pantoprazole sodium  40 mg Per Tube Q1200  . polyethylene glycol  17 g Per Tube Daily  . rosuvastatin  20 mg Per Tube Daily  . senna  1 tablet Per Tube BID  . sodium chloride flush  10-40 mL Intracatheter Q12H      Gean Quint, MD [facility] 10/15/2020, 8:54 AM

## 2020-10-15 NOTE — Progress Notes (Signed)
PT Cancellation Note  Patient Details Name: [name] MRN: [id number] DOB: 28-Aug-1950   Cancelled Treatment:    Reason Eval/Treat Not Completed: Patient not medically ready. Pt remains intubated and sedated. 2nd chest tube placed this morning. Plan to begin CRRT today. PT signing off. Please re-consult when med status improves.   Lorriane Shire 10/15/2020, 10:31 AM  Lorrin Goodell, PT  Office # [phone] Pager [phone]

## 2020-10-15 NOTE — Progress Notes (Signed)
OT Cancellation Note  Patient Details Name: [name] MRN: [id number] DOB: [birth year]   Cancelled Treatment:    Reason Eval/Treat Not Completed: Patient not medically ready (Pt remains intubated and sedated. 2nd chest tube placed this morning. Plan to begin CRRT today. OT signing off. Please re-consult when medical status improves)   Jefferey Pica, OTR/L Acute Rehabilitation Services Pager: [phone] Office: [phone]   Zakariye Nee C 10/15/2020, 12:21 PM

## 2020-10-15 NOTE — Progress Notes (Signed)
NAME:  [name], MRN:  [id number], DOB:  [birth year], LOS: 1 ADMISSION DATE:  09/20/2020, CONSULTATION DATE:  10/12/2020 REFERRING MD:  Dr. Pietro Cassis TR[facility], CHIEF COMPLAINT:  Cardiac arrest   History of Present Illness:  70 year old female with PMH significant for ESRD on HD, CAD, DM, and HTN. 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 She is status post hemicolectomy and was just started on palliative chemotherapy 3/24 with FOLFOX and 5-FU. She woke up 3/25 with some confusion, which persisted and worsened throughout the day causing her to present to [facility] ED. In the ED, her speech did not make sense and there was concern for acute stroke. Neuroimaging was unremarkable. Neurology consultation was obtained and further studies were ordered. Etiology remained uncertain. She was admitted to the hospitalist service with neuro workup ongoing and with supportive care for her chronic medical issues. At approximately 2130 she suffered VF cardiac arrest and code blue was called. She underwent approximately 15 minutes of ACLS with eventual ROSC. See code sheet for detailed report, but she did receive defibrillation, amiodarone, epinephrine, magnesium, and sodium bicarb in the midst of resuscitation. Post-code she was transferred to ICU for further care.   Pertinent  Medical History  Renal Cell Carcinoma Adenocarcinoma of Bowel  Anemia  Chronic Renal Failure  Uncontrolled DM with diabetic retinopathy, nephropathy, right foot ulceration HTN Diastolic Dysfunction - grade I, mild LVH, LVEF 40% in 2019 HLD NSVT - 2020  Significant Hospital Events: Including procedures, antibiotic start and stop dates in addition to other pertinent events    3/25 Admit for AMS with neuro workup ongoing, suffered VF arrest 15 minutes. L CT placed.   3/26 On vent, following commands  3/27 Worsening of SQ air, ? PTX on CXR, increased levo needs /  vaso added, CVVHD   Interim History / Subjective:  Afebrile / Tmax 99.1, WBC 12.6  Vent - 40% / PEEP 5  Glucose range 276-340 I/O - no UOP/ESRD, +2.5L in last 24 hours  Objective   Blood pressure (!) 102/27, pulse 96, temperature 98.3 F (36.8 C), temperature source Axillary, resp. rate (!) 22, height 5\' 7"  (1.702 m), weight 91.1 kg, SpO2 95 %.    Vent Mode: PRVC FiO2 (%):  [40 %] 40 % Set Rate:  [22 bmp] 22 bmp Vt Set:  [490 mL] 490 mL PEEP:  [5 cmH20] 5 cmH20 Plateau Pressure:  [13 cmH20-18 cmH20] 15 cmH20   Intake/Output Summary (Last 24 hours) at 10/15/2020 0802 Last data filed at 10/15/2020 0600 Gross per 24 hour  Intake 2501.63 ml  Output 80 ml  Net 2421.63 ml   Filed Weights   10/12/2020 1355 10/12/2020 2200 10/15/20 0440  Weight: 87 kg 88.6 kg 91.1 kg    Examination: General: critically ill appearing adult female lying in bed on vent in NAD HEENT: MM pink/moist, ETT, sub-Q air noted into neck, anicteric Neuro: sedate, reaches for ETT CV: s1s2 RRR, SR 90's, no m/r/g, left CT dressing changed, CT flushed with sterile technique / no resistance PULM: non-labored on vent, lungs bilaterally coarse / distant on left, sub-Q air noted throughout left chest / arm GI: soft, bsx4 active  Extremities: warm/dry, dependent 1+ edema  Skin: no rashes or lesions  PCXR 3/27 >> images personally reviewed, left chest tube in place, extensive sub-q air      Resolved Hospital Problem list     Assessment &  Plan:   VF cardiac arrest: etiology not totally clear. VF possibly from prolonged QTC but cannot rule out other causes at this time. Downtime 15 mins. Woke post code / following commands.  -ICU monitoring  -repeat troponin, EKG now > suspect troponin leak is demand / CPR related at this point  -ECHO with limited windows, little information.  Will need repeat once SQ air improved -hold full dose anticoagulation for now  Shock  Baseline hypotension on midodrine, may also be seeing  hemodynamic effect of left PTX despite chest tube  -continue levophed for MAP >60-65 -add vasopressin  -assess cortisol -lactic acid 2.4 0600   Acute hypoxemic respiratory failure secondary to cardiac arrest -PRVC 8cc/kg -wean PEEP / FiO2 for sats >90% -follow intermittent CXR, RASS Goal 0 to -1  -fentanyl gtt for pain  -pulmicort BID  -hold home Breo  -hold LABA with QT concerns  Left Pneumothorax: likely post CPR in origin  -increase L lateral CT suction to 30 cm  -place apical chest tube for decompression  -chest tube site care per protocol  -follow daily CXR while CT in place  Acute Metabolic Encephalopathy: this is the original reason for admission and proceeded cardiac arrest. Neuroimaging unremarkable. Spot EEG without sz. CVA and medication effect considered. Neurology raises concers of PRES from either decadron or bevacizumab. Ammiona/TSH/ETOH wnl.  -appreciate Neuro evaluation  -MRI 3/26 with acute cortical / subcortical infarcts of the left parietal and occipital lobes, small chronic left occipital / right frontal infarcts, chronic microvascular changes  -will review above with Neurology > have reached out to Dr. 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 S/p hemicolectomy in Feb. Currently undergoing palliative chemo with FOLFOX and 5-FU.  -hold home 5-FU infusion, can cause arrhythmia & prolonged QT.  This was her first treatment.  Prolonged QTC: -follow QTc  -tele monitoring    ESRD on HD: TTS -appreciate Nephrology consultation  -Trend BMP / urinary output -Replace electrolytes as indicated -plan for CVVHD 3/27, will place temp HD cath.  Anemia of Chronic Disease  -follow CBC -transfuse for Hgb <7%  Leukocytosis  Suspect stress response -monitor WBC trend    DM 2: -change SSI to resistant scale  -increase lantus to 30 units  -add 4 units Q4 TF coverage  Chronic hypotension -levophed as  above  -hold home midodrine, uses on HD days  At Risk Malnutrition  -TF per Nutrition    Best practice (right click and "Reselect all SmartList Selections" daily)  Diet:  Tube Feed  Pain/Anxiety/Delirium protocol (if indicated): Yes (RASS goal -1) VAP protocol (if indicated): Yes DVT prophylaxis: Subcutaneous Heparin GI prophylaxis: PPI Glucose control:  SSI Yes and Basal insulin Yes Central venous access:  Yes, and it is still needed Arterial line:  N/A Foley:  N/A Mobility:  bed rest  PT consulted: N/A Last date of multidisciplinary goals of care discussion [3/25] Code Status:  full code: Has been DNR in the past, and if she does not improve quickly I get the feeling family would not want prolonged life support.  Disposition: ICU  Labs   CBC: Recent Labs  Lab 10/11/20 1506 10/11/20 1506 09/25/2020 1347 10/12/2020 1403 09/21/2020 2153 10/02/2020 2218 10/14/20 0400 10/15/20 0325  WBC 6.6  --  7.7  --  12.1*  --  15.2* 12.6*  NEUTROABS 4.6  --  5.8  --   --   --   --   --  HGB 9.9*   < > 9.2* 8.2* 11.4* 9.2* 9.9* 9.9*  HCT 29.7*  --  27.9* 24.0* 33.9* 27.0* 29.6* 30.0*  MCV 99.3  --  100.0  --  100.3*  --  99.3 99.0  PLT 162  --  148*  --  169  --  259 173   < > = values in this interval not displayed.    Basic Metabolic Panel: Recent Labs  Lab 10/11/20 1506 10/06/2020 1347 09/26/2020 1403 09/28/2020 2153 09/25/2020 2218 10/14/20 0400 10/14/20 1124 10/14/20 2045 10/15/20 0325  NA 144 139 139 140 138 137  --   --  134*  K 4.6 4.4 4.7 4.7 4.5 4.9  --   --  5.4*  CL 98 99 98 101  --  98  --   --  96*  CO2 31 28  --  21*  --  24  --   --  22  GLUCOSE 208* 118* 119* 217*  --  265*  --   --  324*  BUN 18 21 20 22   --  26*  --   --  37*  CREATININE 5.82* 4.84* 5.00* 5.26*  --  5.82*  --   --  6.62*  CALCIUM 9.2 8.9  --  9.2  --  8.9  --   --  8.1*  MG  --   --   --  2.4  --   --  2.0 2.0 1.9  PHOS  --   --   --  6.7*  --   --  8.2* 8.7* 8.4*   GFR: Estimated Creatinine  Clearance: 9.2 mL/min (A) (by C-G formula based on SCr of 6.62 mg/dL (H)). Recent Labs  Lab 10/09/2020 1347 10/12/2020 2153 10/14/20 0230 10/14/20 0400 10/15/20 0325 10/15/20 0559  WBC 7.7 12.1*  --  15.2* 12.6*  --   LATICACIDVEN  --  9.5* 3.1*  --   --  2.4*    Liver Function Tests: Recent Labs  Lab 10/11/20 1506 09/30/2020 1347 10/18/2020 2153  AST 15 16 91*  ALT 11 10 61*  ALKPHOS 66 52 53  BILITOT 0.4 0.3 0.8  PROT 7.2 6.5 5.9*  ALBUMIN 3.3* 3.3* 3.0*   No results for input(s): LIPASE, AMYLASE in the last 168 hours. Recent Labs  Lab 09/20/2020 1720  AMMONIA 31    ABG    Component Value Date/Time   PHART 7.397 10/02/2020 2218   PCO2ART 41.1 09/23/2020 2218   PO2ART 435 (H) 10/18/2020 2218   HCO3 25.3 10/04/2020 2218   TCO2 27 09/28/2020 2218   O2SAT 100.0 09/21/2020 2218     Coagulation Profile: Recent Labs  Lab 10/10/2020 1347  INR 1.0    Cardiac Enzymes: No results for input(s): CKTOTAL, CKMB, CKMBINDEX, TROPONINI in the last 168 hours.  HbA1C: Hgb A1c MFr Bld  Date/Time Value Ref Range Status  10/04/2020 01:47 PM 6.2 (H) 4.8 - 5.6 % Final    Comment:    (NOTE) Pre diabetes:          5.7%-6.4%  Diabetes:              >6.4%  Glycemic control for   <7.0% adults with diabetes   08/28/2020 12:02 AM 7.8 (H) 4.8 - 5.6 % Final    Comment:    (NOTE) Pre diabetes:          5.7%-6.4%  Diabetes:              >6.4%  Glycemic control for   <7.0% adults with diabetes     CBG: Recent Labs  Lab 10/14/20 1522 10/14/20 2010 10/14/20 2308 10/15/20 0333 10/15/20 0737  GLUCAP 260* 320* 340* 294* 276*        Critical care time: 35 minutes    [name], MSN, APRN, NP-C, AGACNP-BC [facility] 10/15/2020, 8:02 AM   Please see [email or web address] for pager details.   From 7A-7P if no response, please call [phone] After hours, please call ELink [phone]

## 2020-10-15 NOTE — Consult Note (Signed)
Cardiology Consultation:  Patient ID: [name] MRN: [id number]; DOB: 11-Nov-1950  Admit date: 10/10/2020 Date of Consult: 10/15/2020  Primary Care Provider: [name], MD Primary Cardiologist: Quay Burow, MD  Primary Electrophysiologist:  Constance Haw, MD  Patient Profile:  [name] is a 70 y.o. female with a hx of ESRD on hemodialysis, colon cancer on chemotherapy, hypertension, CAD (coronary calcification seen on CT chest, 2019 nuclear med stress test showed no evidence of ischemia or infarction), diabetes, hypertension who is being seen today for the evaluation of ventricular tachycardia at the request of Freda Jackson, MD.  History of Present Illness:  She was admitted on 09/25/2020.  Apparently the day prior she had her first chemotherapy treatment.  She apparently was quite confused on the morning of 10/12/2020 and this was the reason for presentation to the emergency room.  On arrival to the emergency room she apparently was able to speak but did not make any coherent sense.  Labs were notable for new anemia.  She was admitted to the hospitalist service overnight for further work-up.  Overnight on 10/06/2020 she did suffer from a VF arrest.  CPR was performed for 20 minutes.  ROSC was obtained after 3 shocks.  She did receive amiodarone, epinephrine magnesium.  She also received sodium bicarb.  Her VF arrest was attributed to QTC prolongation.  Her EKG on admission.  She was in sinus rhythm with a heart rate of 77 with a QTC of 550.  It appears after her arrest she now has a left bundle branch block with a much more prolonged QTC.  I suspect this is just related to bundle branch block.  She did receive several rounds of chest compressions.  Course is also been complicated by left chest pneumothorax.  She has a chest tube in place.  Brain MRI shows acute cortical and subcortical left parietal and occipital lobe strokes.  She also has chronic left occipital and right  frontal infarcts.  She has evaluated her and believe her strokes are due to chronic microvascular disease.  There is also mention of possible alcoholism driving this.  Ms. Ascencio developed what appears to be monomorphic ventricular tachycardia around 11:43 AM this morning.  She did not receive any chest compressions this morning.  It appears the rhythm self terminated per critical care medicine.  She now has what appears to be atrial fibrillation in sinus rhythm.  I have reviewed her EKGs.  QTC is prolonged at this is in the setting of left bundle branch block with a QRS of nearly 130 ms.  Presumably this was related to FOLFOX.  Her potassium magnesium are normal.  It would be odd to have persistent QTC prolongation with the offending agent stopped.  At the time my examination she is intubated.  She is hypotensive on pressors.  CODE STATUS was discussed with the son.  She remains partial code.  Cardiology was consulted for further recommendations.  Heart Pathway Score:       Past Medical History: Past Medical History:  Diagnosis Date  . Anemia   . Anemia in chronic kidney disease 09/29/2018  . Arthritis   . Asthma   . Blood transfusion without reported diagnosis    Phreesia 02/27/2020  . Cataract    OD  . Chronic kidney disease    Phreesia 02/27/2020  . Coronary artery calcification seen on CAT scan 02/17/2018   Coronary calcification on CT  . Depression   . Diabetic retinopathy of both  eyes ([place])   . Diabetic ulcer of right foot associated with type 2 diabetes mellitus ([place])   . ESRD (end stage renal disease) on dialysis ([place])   . Essential hypertension 02/17/2018   Essential hypertension  . HLD (hyperlipidemia)   . Hypertension   . Left ventricular dysfunction 04/07/2018   Left ventricle dysfunction  . Microalbuminuria due to type 2 diabetes mellitus ([place])   . Nonsustained ventricular tachycardia ([place]) 08/28/2018   Nonsustained ventricular tachycardia  . Pneumonia    hx of   . Renal  cell cancer ([place])   . Right renal mass 03/10/2017  . Ulcer of other part of foot 06/22/2013  . 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 AMPUTATION TOE Right 10/29/2019   Procedure: RIGHT 2ND TOE AMPUTATION;  Surgeon: Edrick Kins, DPM;  Location: [facility];  Service: Podiatry;  Laterality: Right;  . AMPUTATION TOE Bilateral 09/04/2020   Procedure: RIGHT THIRD DIGIT AMPUTATION;  Surgeon: Edrick Kins, DPM;  Location: [place];  Service: Podiatry;  Laterality: Bilateral;  . BASCILIC VEIN TRANSPOSITION Left 08/08/2017   Procedure: BASILIC VEIN TRANSPOSITION FIRST STAGE LEFT ARM;  Surgeon: Serafina Mitchell, MD;  Location: [place];  Service: Vascular;  Laterality: Left;  . BASCILIC VEIN TRANSPOSITION Left 05/14/2018   Procedure: SECOND STAGE BASILIC VEIN TRANSPOSITION LEFT ARM;  Surgeon: Serafina Mitchell, MD;  Location: [place];  Service: Vascular;  Laterality: Left;  . BIOPSY  08/29/2020   Procedure: BIOPSY;  Surgeon: Yetta Flock, MD;  Location: [place];  Service: Gastroenterology;;  . CATARACT EXTRACTION Left   . COLONOSCOPY WITH PROPOFOL N/A 08/29/2020   Procedure: COLONOSCOPY WITH PROPOFOL;  Surgeon: Yetta Flock, MD;  Location: [place];  Service: Gastroenterology;  Laterality: N/A;  . EYE SURGERY    . ILEOSTOMY Right 08/30/2020   Procedure: ILEOSTOMY;  Surgeon: Jesusita Oka, MD;  Location: [place];  Service: General;  Laterality: Right;  . IR IMAGING GUIDED PORT INSERTION  10/04/2020  . PARTIAL COLECTOMY N/A 08/30/2020   Procedure: HEMI COLECTOMY;  Surgeon: Jesusita Oka, MD;  Location: [place];  Service: General;  Laterality: N/A;  . POLYPECTOMY  08/29/2020   Procedure: POLYPECTOMY;  Surgeon: Yetta Flock, MD;  Location[facility] END[facility];  Service: Gastroenterology;;  . ROBOTIC ADRENALECTOMY Left 03/10/2017   Procedure: XI ROBOTIC ADRENALECTOMY;  Surgeon: Nickie Retort,  MD;  [facility];  Service: Urology;  Laterality: Left;  . ROBOTIC ASSITED PARTIAL NEPHRECTOMY Right 03/10/2017   Procedure: XI ROBOTIC ASSITED RADICAL NEPHRECTOMY;  Surgeon: Nickie Retort, MD;  [facility];  Service: Urology;  Laterality: Right;  . SUBMUCOSAL TATTOO INJECTION  08/29/2020   Procedure: SUBMUCOSAL TATTOO INJECTION;  Surgeon: Yetta Flock, MD;  Location: [facility] ENDOSCOPY;  Service: Gastroenterology;;  . TOE AMPUTATION Bilateral    both great toe ,, left foot 2nd toe 1/2  . TOE ARTHROPLASTY  09/04/2020   Procedure: ARTHROPLASTY OF THE THIRD DIGIT LEFT FOOT;  Surgeon: Edrick Kins, DPM;  Location: [place] [facility];  Service: Podiatry;;     Home Medications:  Prior to Admission medications   Medication Sig Start Date End Date Taking? Authorizing Provider  doxercalciferol (HECTOROL) 4 MCG/2ML injection Inject 1.5 mLs (3 mcg total) into the vein Every Tuesday,Thursday,and Saturday with dialysis. 09/07/20  Yes Lama, Marge Duncans, MD  insulin degludec (TRESIBA FLEXTOUCH) 100 UNIT/ML FlexTouch Pen Inject 20  Units into the skin daily. Hold if fasting blood sugars are <130. Patient taking differently: Inject 30 Units into the skin daily. Hold if fasting blood sugars are <130. 09/06/20  Yes Darrick Meigs, Marge Duncans, MD  midodrine (PROAMATINE) 5 MG tablet Take 1 tablet (5 mg total) by mouth Every Tuesday,Thursday,and Saturday with dialysis. 09/07/20  Yes Oswald Hillock, MD  multivitamin (RENA-VIT) TABS tablet Take 1 tablet by mouth daily.   Yes [provider]  rosuvastatin (CRESTOR) 20 MG tablet Take 1 tablet (20 mg total) by mouth daily. 11/23/19  Yes [name], MD  Blood Glucose Monitoring Suppl (ONE TOUCH ULTRA 2) w/Device KIT Use to check BS BID-QID Dx:E11.9 05/19/17   [name], MD  BREO ELLIPTA 100-25 MCG/INH AEPB Inhale 1 puff by mouth once daily Patient not taking: No sig reported 08/09/20   [name], MD  Carboxymethylcellulose Sodium (THERATEARS OP) Place 1 drop  into both eyes daily as needed (dry eyes).    [provider]  gabapentin (NEURONTIN) 300 MG capsule TAKE 1 CAPSULE BY MOUTH THREE TIMES DAILY. REQUIRES OFFICE VISIT BEFORE ANY FURTHER REFILLS CAN BE GIVEN Patient taking differently: Take 300 mg by mouth 3 (three) times daily as needed (nerve pain). 06/21/20   [name], MD  insulin aspart (NOVOLOG) 100 UNIT/ML injection Inject 0-6 Units into the skin 3 (three) times daily with meals. Sliding scale insulin Less than 70 initiate hypoglycemia protocol 70-120  0 units 120-150 0 unit 151-200 1 units 201-250 2 units 251-300 3 units 301-350 4 units 351-400 5 units Greater than 400 call MD and give 6 units Patient not taking: No sig reported 09/06/20   Oswald Hillock, MD  Insulin Pen Needle (LIVE BETTER PEN NEEDLES) 31G X 6 MM MISC To use with Tyler Aas pens daily 02/05/19   [name], MD  Lancets [facility] ULTRASOFT) lancets Use as instructed 05/19/17   [name], MD  lidocaine-prilocaine (EMLA) cream Apply 1 application topically as needed. Patient taking differently: Apply 1 application topically as needed (access port). 10/11/20   Lincoln Brigham, PA-C  ondansetron (ZOFRAN) 8 MG tablet Take 1 tablet (8 mg total) by mouth every 8 (eight) hours as needed for nausea or vomiting. 10/11/20   Lincoln Brigham, PA-C  ONETOUCH ULTRA test strip USE AS DIRECTED TO MONITOR  FSBS 3 TIMES DAILY 01/27/20   Annie Main, FNP  oxyCODONE (ROXICODONE) 5 MG immediate release tablet Take 1 tablet (5 mg total) by mouth every 8 (eight) hours as needed for up to 10 doses. Patient not taking: No sig reported 09/06/20   Oswald Hillock, MD  PROAIR HFA 108 [phone] Base) MCG/ACT inhaler INHALE 2 PUFFS BY MOUTH EVERY 6 HOURS AS NEEDED FOR WHEEZING FOR SHORTNESS OF BREATH Patient taking differently: Inhale 2 puffs into the lungs every 6 (six) hours as needed for wheezing or shortness of breath. 08/09/20   [name], MD  prochlorperazine (COMPAZINE) 10 MG  tablet Take 1 tablet (10 mg total) by mouth every 6 (six) hours as needed for nausea or vomiting. 10/11/20   Lincoln Brigham, PA-C  SANTYL ointment Apply 1 application topically daily as needed (wound care). 03/29/20   [provider]  traZODone (DESYREL) 50 MG tablet Take 0.5 tablets (25 mg total) by mouth at bedtime as needed for sleep. Patient not taking: No sig reported 09/06/20   Oswald Hillock, MD    Inpatient Medications: Scheduled Meds: . amiodarone  150 mg Intravenous Once  . [START ON 10/16/2020] aspirin  81 mg Per Tube Daily   Or  . [START ON 10/16/2020] aspirin  300 mg Rectal Daily  . budesonide (PULMICORT) nebulizer solution  0.5 mg Nebulization BID  . chlorhexidine gluconate (MEDLINE KIT)  15 mL Mouth Rinse BID  . Chlorhexidine Gluconate Cloth  6 each Topical Daily  . [START ON 10/16/2020] clopidogrel  75 mg Per Tube Daily  . docusate  100 mg Per Tube BID  . feeding supplement (PROSource TF)  90 mL Per Tube TID  . heparin  5,000 Units Subcutaneous Q8H  . insulin aspart  0-20 Units Subcutaneous Q4H  . insulin aspart  4 Units Subcutaneous Q4H  . insulin glargine  30 Units Subcutaneous QHS  . mouth rinse  15 mL Mouth Rinse 10 times per day  . pantoprazole sodium  40 mg Per Tube Q1200  . polyethylene glycol  17 g Per Tube Daily  . rosuvastatin  20 mg Per Tube Daily  . senna  1 tablet Per Tube BID  . sodium chloride flush  10-40 mL Intracatheter Q12H   Continuous Infusions: . amiodarone     Followed by  . amiodarone    . feeding supplement (VITAL 1.5 CAL) 1,000 mL (10/14/20 1429)  . fentaNYL infusion INTRAVENOUS 80 mcg/hr (10/15/20 0800)  . norepinephrine (LEVOPHED) Adult infusion 35 mcg/min (10/15/20 0800)  . prismasol BGK 2/2.5 dialysis solution    . prismasol BGK 2/2.5 replacement solution    . prismasol BGK 2/2.5 replacement solution    . thiamine injection 500 mg (10/15/20 1128)  . vasopressin 0.03 Units/min (10/15/20 0904)   PRN Meds: acetaminophen **OR**  acetaminophen, albuterol, fentaNYL (SUBLIMAZE) injection, heparin, hydrALAZINE, midazolam, ondansetron (ZOFRAN) IV, polyethylene glycol, sodium chloride flush  Allergies:    Allergies  Allergen Reactions  . Latex Itching  . Penicillins Other (See Comments)    UNSPECIFIED CHILDHOOD REACTION  Has patient had a PCN reaction causing immediate rash, facial/tongue/throat swelling, SOB or lightheadedness with hypotension: Unknown Has patient had a PCN reaction causing severe rash involving mucus membranes or skin necrosis: Unknown Has patient had a PCN reaction that required hospitalization: Unknown Has patient had a PCN reaction occurring within the last 10 years: Unknown If all of the above answers are "NO", then may proceed with Cephalosporin use.   . Adhesive [Tape] Rash    Social History:   Social History   Socioeconomic History  . Marital status: Single    Spouse name: Not on file  . Number of children: 2  . Years of education: 66  . Highest education level: Not on file  Occupational History  . Not on file  Tobacco Use  . Smoking status: Never Smoker  . Smokeless tobacco: Never Used  Vaping Use  . Vaping Use: Never used  Substance and Sexual Activity  . Alcohol use: Yes    Alcohol/week: 0.0 standard drinks    Comment: occ  . Drug use: No  . Sexual activity: Not Currently    Partners: Male  Other Topics Concern  . Not on file  Social History Narrative  . Not on file   Social Determinants of Health   Financial Resource Strain: Low Risk   . Difficulty of Paying Living Expenses: Not very hard  Food Insecurity: Not on file  Transportation Needs: Not on file  Physical Activity: Not on file  Stress: Not on file  Social Connections: Not on file  Intimate Partner Violence: Not on file  Family History:    Family History  Problem Relation Age of Onset  . Heart disease Mother   . Diabetes Sister      ROS:  All other ROS reviewed and negative. Pertinent  positives noted in the HPI.     Physical Exam/Data:   Vitals:   10/15/20 0800 10/15/20 0835 10/15/20 1113 10/15/20 1143  BP: (!) 102/27  (!) 99/58   Pulse: 96     Resp: (!) 22     Temp: 98.2 F (36.8 C)   98.3 F (36.8 C)  TempSrc: Oral   Axillary  SpO2: 95% 93% 94%   Weight:      Height:         Intake/Output Summary (Last 24 hours) at 10/15/2020 1210 Last data filed at 10/15/2020 0800 Gross per 24 hour  Intake 2595.04 ml  Output 80 ml  Net 2515.04 ml    Last 3 Weights 10/15/2020 09/29/2020 09/23/2020  Weight (lbs) 200 lb 13.4 oz 195 lb 5.2 oz 191 lb 12.8 oz  Weight (kg) 91.1 kg 88.6 kg 87 kg    Body mass index is 31.46 kg/m.  General: Intubated on vent, not following commands Head: Atraumatic, normal size  Eyes: PEERLA, EOMI  Neck: Supple, no JVD Endocrine: No thryomegaly Cardiac: Tachycardia noted, no murmurs rubs or gallops Lungs: Diminished breath sounds bilaterally Abd: Soft, nontender, no hepatomegaly  Ext: Warm extremities, no edema Musculoskeletal: No deformities, BUE and BLE strength normal and equal Skin: Warm and dry, no rashes   Neuro: Intubated and sedated on the vent  EKG:  The EKG was personally reviewed and demonstrates: Sinus tachycardia heart rate 107, left bundle branch block noted, QRS 138 ms Telemetry:  Telemetry was personally reviewed and demonstrates: Atrial fibrillation and normal sinus rhythm, brief ventricular tachycardia noted  Relevant CV Studies: TTE 03/02/2018 - Procedure narrative: Transthoracic echocardiography. Image  quality was poor. The study was technically difficult.  Intravenous contrast (Definity) was administered to opacify the  LV.  - Left ventricle: The cavity size was normal. Wall thickness was  increased in a pattern of mild LVH. The estimated ejection  fraction was 40%. Diffuse hypokinesis. Doppler parameters are  consistent with abnormal left ventricular relaxation (grade 1  diastolic dysfunction).  -  Aortic valve: There was no stenosis.  - Aorta: Ascending aortic diameter: 37 mm (S).  - Ascending aorta: The ascending aorta was borderline dilated.  - Mitral valve: Mildly calcified annulus. There was no significant  regurgitation.  - Left atrium: The atrium was mildly dilated.  - Right ventricle: The cavity size was normal. Systolic function  was normal.  - Right atrium: The atrium was mildly dilated.  - Pulmonary arteries: No complete TR doppler jet so unable to  estimate PA systolic pressure.  - Inferior vena cava: The vessel was normal in size. The  respirophasic diameter changes were in the normal range (>= 50%),  consistent with normal central venous pressure.  - Pericardium, extracardiac: A trivial pericardial effusion was  identified posterior to the heart.   Laboratory Data: High Sensitivity Troponin:   Recent Labs  Lab 10/03/2020 2153 10/14/20 0400 10/14/20 0745 10/14/20 1124  TROPONINIHS 59* 517* 836* 906*     Cardiac EnzymesNo results for input(s): TROPONINI in the last 168 hours. No results for input(s): TROPIPOC in the last 168 hours.  Chemistry Recent Labs  Lab 10/19/2020 2153 09/23/2020 2218 10/14/20 0400 10/15/20 0325  NA 140 138 137 134*  K 4.7 4.5 4.9 5.4*  CL  101  --  98 96*  CO2 21*  --  24 22  GLUCOSE 217*  --  265* 324*  BUN 22  --  26* 37*  CREATININE 5.26*  --  5.82* 6.62*  CALCIUM 9.2  --  8.9 8.1*  GFRNONAA 8*  --  7* 6*  ANIONGAP 18*  --  15 16*    Recent Labs  Lab 10/11/20 1506 10/07/2020 1347 10/04/2020 2153  PROT 7.2 6.5 5.9*  ALBUMIN 3.3* 3.3* 3.0*  AST 15 16 91*  ALT 11 10 61*  ALKPHOS 66 52 53  BILITOT 0.4 0.3 0.8   Hematology Recent Labs  Lab 10/04/2020 2153 09/29/2020 2218 10/14/20 0400 10/15/20 0325  WBC 12.1*  --  15.2* 12.6*  RBC 3.38*  --  2.98* 3.03*  HGB 11.4* 9.2* 9.9* 9.9*  HCT 33.9* 27.0* 29.6* 30.0*  MCV 100.3*  --  99.3 99.0  MCH 33.7  --  33.2 32.7  MCHC 33.6  --  33.4 33.0  RDW 15.4  --  15.6*  16.0*  PLT 169  --  259 173   BNP Recent Labs  Lab 10/09/2020 2330  BNP 271.0*    DDimer No results for input(s): DDIMER in the last 168 hours.  Radiology/Studies:  DG Chest 1 View  Result Date: 10/15/2020 CLINICAL DATA:  Central line placement EXAM: CHEST  1 VIEW COMPARISON:  Portable exam 1057 hours compared to 0516 hours FINDINGS: Tip of endotracheal tube projects 3.3 cm above carina. Nasogastric tube extends into abdomen. Two LEFT thoracostomy tubes present, pigtail new since prior study. RIGHT jugular Port-A-Cath with tip projecting over cavoatrial junction. New LEFT jugular central venous catheter with tip projecting over SVC. Tips of lung apices excluded. Normal heart size and mediastinal contours. Extensive chest wall pneumatosis projects over thorax bilaterally. Linear gas collection at medial inferior LEFT hemithorax likely medial LEFT pneumothorax. LEFT pneumothorax is significantly decreased following second chest tube insertion. Central peribronchial thickening and bibasilar atelectasis versus infiltrate unchanged. IMPRESSION: Decrease in LEFT pneumothorax following second thoracostomy tube insertion. New LEFT jugular line with tip projecting over SVC. Persistent bronchitic changes and bibasilar atelectasis versus consolidation. Extensive chest wall emphysema. Electronically Signed   By: Lavonia Dana M.D.   On: 10/15/2020 11:16   MR BRAIN WO CONTRAST  Result Date: 10/14/2020 CLINICAL DATA:  TIA EXAM: MRI HEAD WITHOUT CONTRAST TECHNIQUE: Multiplanar, multiecho pulse sequences of the brain and surrounding structures were obtained without intravenous contrast. COMPARISON:  None. FINDINGS: Brain: Cortical/subcortical reduced diffusion in the left parietal and occipital lobes. No evidence of hemorrhage. There is no intracranial mass or significant mass effect. Patchy foci of T2 hyperintensity in the supratentorial and pontine white matter are nonspecific may reflect mild to moderate chronic  microvascular ischemic changes. Prominence of the ventricles and sulci reflects generalized parenchymal volume loss. Chronic parasagittal left occipital infarct. Small chronic right middle frontal gyrus infarct. There is no hydrocephalus or extra-axial fluid collection. Vascular: Major vessel flow voids at the skull base are preserved. Skull and upper cervical spine: Normal marrow signal is preserved. Sinuses/Orbits: Minor mucosal thickening.  Orbits are unremarkable. Other: Sella is unremarkable.  Mastoid air cells are clear. IMPRESSION: Acute cortical/subcortical infarcts of the left parietal and occipital lobes. No hemorrhage. Small chronic left occipital and right frontal infarcts. Chronic microvascular ischemic changes. Electronically Signed   By: Macy Mis M.D.   On: 10/14/2020 13:55   DG CHEST PORT 1 VIEW  Result Date: 10/15/2020 CLINICAL DATA:  Acute respiratory failure  with hypoxia EXAM: PORTABLE CHEST 1 VIEW COMPARISON:  10/14/2020 FINDINGS: Indwelling left chest tube. Moderate left pneumothorax, progressive. Extensive subcutaneous emphysema predominantly over the left chest wall, progressive. Patchy bilateral lower lobe opacities, likely atelectasis. No pleural effusions. Endotracheal tube terminates 4.8 cm above the carina. Right chest power port terminates the cavoatrial junction. Enteric tube courses into the stomach. The heart is normal in size. IMPRESSION: Moderate left pneumothorax, progressive. Indwelling left chest tube. Extensive subcutaneous emphysema, progressive. These results will be called to the ordering clinician or representative by the Radiologist Assistant, and communication documented in the PACS or [employer]. Electronically Signed   By: Julian Hy M.D.   On: 10/15/2020 05:43   DG CHEST PORT 1 VIEW  Result Date: 10/14/2020 CLINICAL DATA:  Chest tube placement EXAM: PORTABLE CHEST 1 VIEW COMPARISON:  10/14/2020 at 0417 hours FINDINGS: Indwelling left chest  tube. A persistent trace left pneumothorax may be obscured by the overlying defibrillator pad. Right lung is essentially clear.  No definite pleural effusion. Endotracheal tube terminates 10 mm above the carina. Enteric tube courses into the proximal stomach. Right chest power port terminates at the cavoatrial junction. Mild cardiomegaly. IMPRESSION: Indwelling left chest tube. A persistent trace left pneumothorax may be obscured by the overlying defibrillator pad. Endotracheal tube terminates 10 mm above the carina. Additional support apparatus as above. Electronically Signed   By: Julian Hy M.D.   On: 10/14/2020 06:18   DG Chest Port 1 View  Result Date: 10/14/2020 CLINICAL DATA:  Acute respiratory failure EXAM: PORTABLE CHEST 1 VIEW COMPARISON:  Yesterday FINDINGS: Endotracheal tube with tip 7 mm above the carina. The enteric tube tip is at the distal stomach or proximal duodenum. Right port with tip at the upper cavoatrial junction. Moderate left pneumothorax which could be up to 50%. There is superimposed pulmonary and pleural density. New chest wall emphysema. Critical Value/emergent results were called by telephone at the time of interpretation on 10/14/2020 at 4:49 am to provider Trevor Mace , who verbally acknowledged these results. IMPRESSION: 1. New moderate left pneumothorax and chest wall emphysema. 2. Worsening left pulmonary aeration with probable pleural fluid. 3. Low endotracheal tube with tip 7 mm above the carina. The enteric tube tip at least reaches the pylorus. Electronically Signed   By: Monte Fantasia M.D.   On: 10/14/2020 04:50   DG CHEST PORT 1 VIEW  Result Date: 09/30/2020 CLINICAL DATA:  Respiratory failure, history of colon cancer EXAM: PORTABLE CHEST 1 VIEW COMPARISON:  10/12/2020 at 1:45 p.m. FINDINGS: Single frontal view of the chest demonstrates endotracheal tube overlying tracheal air column, tip approximately 1.5 cm above carina. External defibrillator pads  overlie the cardiac silhouette. Right chest wall port via internal jugular approach unchanged. Cardiac silhouette is stable. No airspace disease, effusion, or pneumothorax. IMPRESSION: 1. No complication after intubation. No acute intrathoracic process. Electronically Signed   By: Randa Ngo M.D.   On: 10/01/2020 22:15   DG Chest Port 1 View  Result Date: 10/04/2020 CLINICAL DATA:  Weakness.  Code stroke. EXAM: PORTABLE CHEST 1 VIEW COMPARISON:  11/28/2012 FINDINGS: Aortic atherosclerosis. There is a right chest wall port a catheter with tip in the right atrium. No pleural effusion or edema. No airspace densities. The visualized osseous structures are unremarkable. IMPRESSION: No acute cardiopulmonary abnormalities. Electronically Signed   By: Kerby Moors M.D.   On: 10/14/2020 14:19   DG Abd Portable 1V  Result Date: 10/10/2020 CLINICAL DATA:  Check gastric catheter placement EXAM: PORTABLE  ABDOMEN - 1 VIEW COMPARISON:  None. FINDINGS: Gastric catheter is noted within the distal stomach. No free air is seen. No other focal abnormality is noted. IMPRESSION: Gastric catheter within the stomach. Electronically Signed   By: Inez Catalina M.D.   On: 10/12/2020 23:35   EEG adult  Result Date: 09/28/2020 Raenette Rover, MD     09/23/2020  7:28 PM EEG Report Indication: possible seizure activity This study was recorded in the waiting/drowsy state.  The duration of the study was 24 minutes.  Electrodes were placed according to the International 10/20 system.  Video was reviewed/available for clinical correlation as needed. In the waking state, discernible but diminished background organization is seen with a severely attenuated anterior - posterior voltage and frequency gradient.  In the occipital leads there was a symmetric and reactive although indistinct and poorly sustained posterior dominant rhythm of approximately 6 hertz, which is slower than expected for age  Anteriorly, is the expected pattern  of faster frequency, lower voltage waveforms, although in a reduced amount as compared to normal. In addition, there was diffusely seen intermixed low - moderate amplitude Delta and theta slowing with overlying faster frequencies. During the drowsy state, there is further attenuation of the gradient, a general shift to slower frequencies diffusely, a waxing and waning of the posterior dominant rhythm, and slow roving eye movements. Hyperventilation: deferred Photic stimulation: deferred There are no clear focal, paroxysmal or epileptiform abnormalities  or interhemispheric asymmetries. Impression: This is an abnormal waking and drowsy study due to markedly diminished background organization as described above, most clinically compatible with a mild-moderate diffuse encephalopathy.  There are no clear focal or epileptiform abnormalities.   CT HEAD CODE STROKE WO CONTRAST  Result Date: 10/14/2020 CLINICAL DATA:  Code stroke. Neuro deficit, acute, stroke suspected. Additional history provided: Confusion, last known well 10 p.m. EXAM: CT HEAD WITHOUT CONTRAST TECHNIQUE: Contiguous axial images were obtained from the base of the skull through the vertex without intravenous contrast. COMPARISON:  No pertinent prior exams available for comparison. FINDINGS: Brain: Mild cerebral and cerebellar atrophy. Small chronic appearing cortical infarct within the medial left occipital lobe (for instance as seen on series 3, image 12). Mild patchy and ill-defined hypoattenuation within the cerebral white matter is nonspecific, but compatible chronic small vessel ischemic disease. There is no acute intracranial hemorrhage. No demarcated cortical infarct. No extra-axial fluid collection. No evidence of intracranial mass. No midline shift. Vascular: No hyperdense vessel.  Atherosclerotic calcifications. Skull: Normal. Negative for fracture or focal lesion. Sinuses/Orbits: Visualized orbits show no acute finding. No significant  paranasal sinus disease. ASPECTS ([place] Stroke Program Early CT Score) - Ganglionic level infarction (caudate, lentiform nuclei, internal capsule, insula, M1-M3 cortex): 7 - Supraganglionic infarction (M4-M6 cortex): 3 Total score (0-10 with 10 being normal): 10 These results were called by telephone at the time of interpretation on 10/18/2020 at 1:47 pm to provider Dr, Leonel Ramsay, who verbally acknowledged these results. IMPRESSION: No evidence of acute intracranial abnormality.  ASPECTS is 10. 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 Mild generalized parenchymal atrophy and cerebral white matter chronic small vessel ischemic disease. Electronically Signed   By: Kellie Simmering DO   On: 09/20/2020 13:51   VAS US CAROTID  Result Date: 10/14/2020 Carotid Arterial Duplex Study Indications:       CVA. Risk Factors:      Hypertension, hyperlipidemia, Diabetes, coronary artery  disease. Limitations        Today's exam was limited due to patient on a ventilator and                    patient positioning. Comparison Study:  07/13/18 previous Performing Technologist: Abram Sander RVS  Examination Guidelines: A complete evaluation includes B-mode imaging, spectral Doppler, color Doppler, and power Doppler as needed of all accessible portions of each vessel. Bilateral testing is considered an integral part of a complete examination. Limited examinations for reoccurring indications may be performed as noted.  Right Carotid Findings: +----------+--------+--------+--------+------------------+---------------------+           PSV cm/sEDV cm/sStenosisPlaque DescriptionComments              +----------+--------+--------+--------+------------------+---------------------+ CCA Prox  111     36              heterogenous                            +----------+--------+--------+--------+------------------+---------------------+ CCA Distal68      23              heterogenous                             +----------+--------+--------+--------+------------------+---------------------+ ICA Prox  67      27      1-39%   heterogenous      limited visualization                                                     due to patient                                                            positioning           +----------+--------+--------+--------+------------------+---------------------+ ICA Mid                                             Not visualized        +----------+--------+--------+--------+------------------+---------------------+ ECA       88      14                                                      +----------+--------+--------+--------+------------------+---------------------+ +----------+--------+-------+--------+-------------------+           PSV cm/sEDV cmsDescribeArm Pressure (mmHG) +----------+--------+-------+--------+-------------------+ [id number]                                         +----------+--------+-------+--------+-------------------+ +---------+--------+--------+--------------+ VertebralPSV cm/sEDV cm/sNot identified +---------+--------+--------+--------------+  Left Carotid Findings: +----------+--------+--------+--------+------------------+--------+           PSV cm/sEDV cm/sStenosisPlaque DescriptionComments +----------+--------+--------+--------+------------------+--------+ CCA Prox  103  33              heterogenous               +----------+--------+--------+--------+------------------+--------+ CCA Distal82      33              heterogenous               +----------+--------+--------+--------+------------------+--------+ ICA Prox  84      32      1-39%   heterogenous               +----------+--------+--------+--------+------------------+--------+ ICA Distal95      43                                          +----------+--------+--------+--------+------------------+--------+ ECA       71                                                 +----------+--------+--------+--------+------------------+--------+ +----------+--------+--------+--------+-------------------+           PSV cm/sEDV cm/sDescribeArm Pressure (mmHG) +----------+--------+--------+--------+-------------------+ [id number]                                          +----------+--------+--------+--------+-------------------+ +---------+--------+--+--------+--+---------+ VertebralPSV cm/s78EDV cm/s36Antegrade +---------+--------+--+--------+--+---------+   Summary: Right Carotid: Velocities in the right ICA are consistent with a 1-39% stenosis. Left Carotid: Velocities in the left ICA are consistent with a 1-39% stenosis. Vertebrals: Bilateral vertebral arteries demonstrate antegrade flow. *See table(s) above for measurements and observations.     Preliminary    ECHOCARDIOGRAM LIMITED  Result Date: 10/14/2020    ECHOCARDIOGRAM REPORT   Patient Name:   KAYLIE RITTER Date of Exam: 10/14/2020 Medical Rec #:  [id number]     Height:       67.0 in Accession #:    [id number]    Weight:       195.3 lb Date of Birth:  [birth year]     BSA:          2.002 m Patient Age:    51 years      BP:           120/70 mmHg Patient Gender: F             HR:           81 bpm. Exam Location:  Inpatient Procedure: Limited Echo, Color Doppler and Cardiac Doppler Indications:    stroke  History:        Patient has prior history of Echocardiogram examinations, most                 recent 03/02/2018. Cancer. end stage renal disease.,                 Arrythmias:Ventricular Fibrillation; Risk Factors:Hypertension.  Sonographer:    Johny Chess Referring Phys: [id number] [facility]  Sonographer Comments: Technically difficult study due to poor echo windows, Technically challenging study due to limited acoustic windows, suboptimal parasternal window and echo  performed with patient supine and on artificial respirator. Image acquisition challenging due to patient body habitus and Image acquisition challenging due to respiratory motion. IMPRESSIONS  1. Extremely  limited; LV function ? preserved; no pericardial effusion; no other information could be obtained with this study; suggest TEE if clinically indicated.  2. Left ventricular ejection fraction, by estimation, is Cannot be assessed%. The left ventricle has Cannot be assessed function. Left ventricular endocardial border not optimally defined to evaluate regional wall motion. The left ventricular internal cavity size was Not assessed. There is Not assessed left ventricular hypertrophy. Left ventricular diastolic function could not be evaluated.  3. Right ventricular systolic function is normal. The right ventricular size is normal.  4. The mitral valve was not assessed. Not fully assessed mitral valve regurgitation.  5. Tricuspid valve regurgitation Not fully assessed.  6. The aortic valve was not assessed. Aortic valve regurgitation Not assessed.  7. Pulmonic valve regurgitation Not assessed. FINDINGS  Left Ventricle: Left ventricular ejection fraction, by estimation, is Cannot be assessed%. The left ventricle has Cannot be assessed function. Left ventricular endocardial border not optimally defined to evaluate regional wall motion. The left ventricular internal cavity size was Not assessed. There is Not assessed left ventricular hypertrophy. Left ventricular diastolic function could not be evaluated. Right Ventricle: The right ventricular size is normal. Right vetricular wall thickness was not assessed. Right ventricular systolic function is normal. Left Atrium: Left atrial size was not assessed. Right Atrium: Right atrial size was not assessed. Pericardium: There is no evidence of pericardial effusion. Mitral Valve: The mitral valve was not assessed. Not fully assessed mitral valve regurgitation. Tricuspid Valve:  The tricuspid valve is not assessed. Tricuspid valve regurgitation Not fully assessed. No evidence of tricuspid stenosis. Aortic Valve: The aortic valve was not assessed. Aortic valve regurgitation Not assessed. Pulmonic Valve: The pulmonic valve was not assessed. Pulmonic valve regurgitation Not assessed. Aorta: Aortic root could not be assessed. Venous: The inferior vena cava was not well visualized.  Additional Comments: Extremely limited; LV function ? preserved; no pericardial effusion; no other information could be obtained with this study; suggest TEE if clinically indicated.  MV E velocity: 54.40 cm/s MV A velocity: 83.10 cm/s MV E/A ratio:  0.65 Kirk Ruths MD Electronically signed by Kirk Ruths MD Signature Date/Time: 10/14/2020/2:08:03 PM    Final    CT ANGIO HEAD NECK W WO CM W PERFUSION  Addendum Date: 10/18/2020   ADDENDUM REPORT: 10/08/2020 16:01 ADDENDUM: The CT perfusion was performed. However, the CT perfusion was stopped due to patient motion and restarted. The quality of the data is suspect. Based on rapid analysis, no evidence of acute infarct or ischemia. Electronically Signed   By: Franchot Gallo M.D.   On: 10/14/2020 16:01   Result Date: 10/03/2020 CLINICAL DATA:  Focal neuro deficit greater than 6 hours. Suspect stroke. Confusion EXAM: CT ANGIOGRAPHY HEAD AND NECK CT PERFUSION HEAD TECHNIQUE: Multidetector CT imaging of the head and neck was performed using the standard protocol during bolus administration of intravenous contrast. Multiplanar CT image reconstructions and MIPs were obtained to evaluate the vascular anatomy. Carotid stenosis measurements (when applicable) are obtained utilizing NASCET criteria, using the distal internal carotid diameter as the denominator. CONTRAST:  119m OMNIPAQUE IOHEXOL 350 MG/ML SOLN COMPARISON:  CT head 09/30/2020 FINDINGS: CT HEAD FINDINGS Following the CT angiogram: Delayed imaging of the brain obtained. No enhancing lesion identified.  Patchy white matter hypodensity again noted. 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 CTA NECK FINDINGS Aortic arch: Standard branching. Imaged portion shows no evidence of aneurysm or dissection. No significant stenosis of the major arch vessel origins. Atherosclerotic calcification  aortic arch and proximal great vessels. Right carotid system: Atherosclerotic calcification in the right common carotid artery without stenosis. Atherosclerotic calcification right carotid bifurcation without significant stenosis. Left carotid system: Atherosclerotic calcification left common carotid artery without stenosis. Atherosclerotic calcification left carotid bifurcation. Approximately 25% diameter stenosis left internal carotid artery. Vertebral arteries: Moderate calcific stenosis origin of right vertebral artery without additional stenosis and patent to the basilar. Mild calcific stenosis origin of left vertebral artery without additional stenosis. Skeleton: Disc and facet degeneration throughout the cervical and thoracic spine. No acute skeletal abnormality. Other neck: Subcentimeter thyroid nodules bilaterally. No further imaging necessary. Upper chest: Lung apices clear bilaterally. 22 mm right lower paratracheal lymph node. Review of the MIP images confirms the above findings CTA HEAD FINDINGS Anterior circulation: Atherosclerotic calcification without stenosis in the cavernous carotid bilaterally. Anterior middle cerebral arteries widely patent without large vessel occlusion. Posterior circulation: Posterior circulation widely patent without stenosis or large vessel occlusion. Venous sinuses: Normal venous enhancement. Anatomic variants: None Review of the MIP images confirms the above findings IMPRESSION: 1. Postcontrast imaging of the brain reveals no enhancing mass lesion. No acute infarct. 2. Atherosclerotic calcification aortic arch and proximal great vessels and carotid artery  bilaterally. 3. Atherosclerotic calcification right carotid bifurcation without significant stenosis. Less than 25% diameter stenosis proximal left internal carotid artery 4. Atherosclerotic calcification and stenosis at the origin of the vertebral artery bilaterally right greater than left. 5. No intracranial large vessel occlusion 6. 22 mm right lower paratracheal lymph node. 7. CT perfusion images pending at this time. Electronically Signed: By: Franchot Gallo M.D. On: 10/08/2020 15:18    Assessment and Plan:   1. VF Arrest/ventricular tachycardia -She had an initial VF arrest on 10/03/2020.  This was presumably related to QTC prolongation.  EKG postarrest showed A. fib with a very wide QRS complex. QTC~570 ms. -She did fairly well in ICU with no recurrent arrhythmias on 10/14/2020. On 10/14/2020 she basically remained in the ICU was found to have several small strokes which have been attributed to microvascular disease.  No mention of cardioembolic source per neurology's work-up. -On 10/15/2020 around 11:50 AM she developed ventricular tachycardia with rate up to 210 ms.  She was treated briefly with chest compressions but no shocks were delivered.  This self terminated.  Her EKG demonstrated sinus tachycardia with left bundle branch block.  She also developed Afib around the time of this VT episode.  Amiodarone was started and she has converted back to sinus rhythm. -Unclear what led to initial QTC prolongation.  This could have been attributed to FOLFOX.  Labs this morning show a potassium of 5.4 magnesium of 1.9.  This would not explain her QTC prolongation. -Troponin values are minimal and flat.  Repeat are pending. -Echocardiogram was unable to show any meaningful information due to her body habitus. -In 2019 she had an echocardiogram that showed normal LV function.  She also had a stress test in 2019 that was normal.  Her history of CAD appears to be related to a chest CT with coronary  calcifications.  She does not appear to have any obstructive CAD. -After being placed on amiodarone this morning her EKG demonstrates normal sinus rhythm with a left bundle branch block.  Heart rate is 89.  Her QTC is much shorter and corrects to 455 ms.  The episode today was clearly VT and not polymorphic ventricular tachycardia.  She then was in and out of atrial fibrillation.  A lot of her wide-complex  rhythm was A. fib with RVR. Overall I do not believe the episode today on 10/15/2020 was a torsade event or VF. It is not consistent with torsades or VF.  Her QTC has corrected and now is 455 ms.  She did have QTc prolongation in the initial VF could have been related to that.  However, at this time it does not appear to represent persistent QTC prolongation.  Again this fits with the clinical picture.  It would be rather odd for the inciting agent, presumably her chemotherapy, to have continued to resulting QTC prolongation once the offending agent has been stopped.  Her potassium and magnesium do not explain this. -A more likely explanation is that she does have underlying CAD and develop VT from metabolic derangements.  She could be critically ill due to sepsis and have profound acidosis that has resulted in a ventricular arrhythmia.  To me, this is all metabolically driven ventricular tachycardia.  Again, the initial event on 10/08/2020 may have been VF in the setting of QTC prolongation. -Given her critical illness and ICU requiring pressor support, end-stage renal disease, colon cancer on palliative chemo, recent strokes, I do not believe coronary angiography is indicated at this time.  -For now would recommend to continue amiodarone.  I see no dangerous QTC prolongation on a repeat EKG that would lead me to believe this is contraindicated.  Again with the offending agent removed she should have no further QTC prolongation.  We must also be careful to interpret her QTC as she does have a left bundle branch  block now.  I suspect this is metabolically driven as well.  We have ordered stat ABG, BMP and other labs. I think blood cultures are warranted.  I will trend troponins as well.  Her initial troponins were elevated but unremarkable.  Given her cardiac arrest this would be expected they are elevated.  -Overall, would recommend aggressive goals of care. She is not a great candidate for aggressive cardiovascular care.   2.  Atrial fibrillation with RVR -She has now been in and out of A. fib.  Would continue amiodarone drip for now.  Would hold anticoagulation in setting of recent strokes. -We will see how she does over the next 24 hours.  3.  Elevated troponin/demand ischemia in the setting of cardiac arrest -Troponin elevation on 10/14/2020 was minimal and flat.  I suspect this is all secondary to arrest and chest compressions.  Repeat troponins are pending.  For questions or updates, please contact [place] Please consult [email or web address] for contact info under   Signed, [place] Bells T. Audie Box, MD, [place]  10/15/2020 12:10 PM

## 2020-10-15 NOTE — Plan of Care (Signed)
  Interdisciplinary Goals of Care Family Meeting   Date carried out:: 10/15/2020  Location of the meeting: Conference room  Member's involved: Nurse Practitioner and Family Member or next of kin  Durable Power of Attorney or acting medical decision maker: Son -  [name]  Discussion: We discussed goals of care for [name] .  Discussed patients hospitalization from admission.  Son indicates his Mom had been a DNR and it was recently reversed.  He states she has always said she did not want extraordinary measures at end of life.  We reviewed her baseline illnesses to include HTN, ESRD on HD, renal cell carcinoma and recent diagnosis of adenocarcinoma of the bowel s/p hemicolectomy with recent initiation of palliative chemotherapy (FOLFOX & 5-FU).  She unfortunately suffered a cardiac arrest on admission which was complicated by pneumothorax requiring chest tube placement.  She has remained on mechanical ventilation.  Further work up of initial presenting symptoms of altered mental status revealed acute cortical / subcortical infarcts on MRI.  She also suffered episode of VT 3/27 requiring Mg+, lidocaine + amiodarone.  He indicates that quality of life is very important for her.  He believes that she would not want CPR in the event of arrest.  Given the arrhythmia this am, he initially thought he would be ok with cardioversion if necessary in the short term but later felt as if we should not and requested full DNR status.  He is concerned about knowing "when we are crossing a line of no longer helping her but making her suffer".  We discussed short term (24-48) hours of HD to see if she stabilizes but if has further set backs or declines, would transition her care. We will continue all supportive efforts at this time if her team believes the process to be a reversible one.  If she declines, would call family and they would want to stop aggressive support.   Code status: Full DNR  Disposition:  Continue current acute care  Time spent for the meeting: 6 minutes   [name], MSN, APRN, NP-C, AGACNP-BC [facility] 10/15/2020, 1:04 PM   Please see [email or web address] for pager details.   From 7A-7P if no response, please call [phone] After hours, please call [facility]Link [phone]

## 2020-10-15 NOTE — Progress Notes (Addendum)
Neurology Progress Note  Patient ID: [name] is a 71 y.o. with PMHx of  has a past medical history of Anemia, Anemia in chronic kidney disease (09/29/2018), Arthritis, Asthma, Blood transfusion without reported diagnosis, Cataract, Chronic kidney disease, Coronary artery calcification seen on CAT scan (02/17/2018), Depression, Diabetic retinopathy of both eyes ([place]), Diabetic ulcer of right foot associated with type 2 diabetes mellitus ([place]), ESRD (end stage renal disease) on dialysis [facility]), Essential hypertension (02/17/2018), HLD (hyperlipidemia), Hypertension, Left ventricular dysfunction (04/07/2018), Microalbuminuria due to type 2 diabetes mellitus ([place]), Nonsustained ventricular tachycardia ([place]) (08/28/2018), Pneumonia, Renal cell cancer ([place]), Right renal mass (03/10/2017), Ulcer of other part of foot (06/22/2013), and 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  Initially consulted for: general confusion and disorientation   [name] is an 70 y.o. 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 She was noted to be slightly confused when she first woke up Friday morning, with progressive worsening after she laid down for a nap; for example, she was pulling off her colostomy bag not knowing how to manage it at all which she was able to do before. On presentation to the [facility] ED, she was globally confused and disoriented. She was last seen being completely normally around 10 PM on Thursday prior to admission.  CT head was negative. CTA showed no LVO. DDx for her progressive confusion per  Teleneurology was acute ischemic stroke, medication effect from recent chemotherapy, postictal confusion due to unwitnessed seizure and PRES secondary to Decadron and bevacizumab. MRI  Brain showed acute cortical/subcorical infarcts at left parietal and occipital lobes without hemorrhage.   Major interval events:  VF cardiac arrest 3/25 in hospital. intubated.  Subjective: Intubated/sedated   Exam: Vitals:   10/15/20 0800 10/15/20 0835  BP: (!) 102/27   Pulse: 96   Resp: (!) 22   Temp: 98.2 F (36.8 C)   SpO2: 95% 93%   Gen: In bed, comfortable /intubated Resp: intubated, non-labored breathing, no grossly audible wheezing Cardiac: Perfusing extremities well  Abd: soft, nt  Neuro: PHYSICAL EXAM General: Awake, intubated, resting in bed in NAD HEENT-normocephalic, atraumatic Lungs: Clear to auscultation bilaterally, no wheezing, rhonchi or rales  Cardiovascular: Normal S1, S2; RRR; no murmurs, rubs, or gallops Abdomen: Nondistended    Neurological exam  Neurological Examination; done off fentanyl. Mental Status: Intubated and sedated on fentanyl, but discontinued temporarily for our exam. Will attempt to open eyes and follow  simple commands, but this inconsistent at this time.  Cranial Nerves: II: PERRL. Will gaze in the general direction of examiner   III,IV, VI: No ptosis. Saccadic visual pursuits noted; gazes to left and right. Eyes are conjugate. No forced gaze deviation.   VII: Unable to assess for smnormal bilateralile due to intubation.  VIII: hearing intact to voice IX,X: Intubated XI: Head is midline XII: Unable to assess  Motor:  Cannot lift arms at deltoids. Tone decreased bilaterally.  BLE: Will move spontaneously bilaterally without asymmetry. Tone mildly decreased.  Sensory: Reacts to touch x 4   Plantars: Equivocal bilaterally  Cerebellar: Unable to assess due to weakness and AMS Gait: Unable to assess    Pertinent  Labs:  Results for [name]" (MRN [id number]) as of 10/15/2020 09:54  Ref. Range 10/15/2020 03:25  Sodium Latest Ref Range: 135 - 145 mmol/L 134 (L)  Potassium Latest Ref Range: 3.5 - 5.1 mmol/L 5.4 (H)  Chloride Latest Ref Range: 98 - 111 mmol/L 96 (L)  CO2 Latest Ref Range: 22 - 32 mmol/L 22  Glucose Latest Ref Range: 70 - 99 mg/dL 324 (H)  BUN Latest Ref Range: 8 - 23 mg/dL 37 (H)  Creatinine Latest Ref Range: 0.44 - 1.00 mg/dL 6.62 (H)  Calcium Latest Ref Range: 8.9 - 10.3 mg/dL 8.1 (L)  Anion gap Latest Ref Range: 5 - 15  16 (H)  Phosphorus Latest Ref Range: 2.5 - 4.6 mg/dL 8.4 (H)  Magnesium Latest Ref Range: 1.7 - 2.4 mg/dL 1.9  GFR, Estimated Latest Ref Range: >60 mL/min 6 (L)  WBC Latest Ref Range: 4.0 - 10.5 K/uL 12.6 (H)  RBC Latest Ref Range: 3.87 - 5.11 MIL/uL 3.03 (L)  Hemoglobin Latest Ref Range: 12.0 - 15.0 g/dL 9.9 (L)  HCT Latest Ref Range: 36.0 - 46.0 % 30.0 (L)  MCV Latest Ref Range: 80.0 - 100.0 fL 99.0  MCH Latest Ref Range: 26.0 - 34.0 pg 32.7  MCHC Latest Ref Range: 30.0 - 36.0 g/dL 33.0  RDW Latest Ref Range: 11.5 - 15.5 % 16.0 (H)  Platelets Latest Ref Range: 150 - 400 K/uL 173   Results for [name]" (MRN [id number]) as of 10/15/2020 09:54  Ref. Range 10/16/2020 13:47  Hemoglobin A1C Latest Ref Range: 4.8 - 5.6 % 6.2 (H)  Results for [name]" (MRN [id number]) as of 10/15/2020 09:54  Ref. Range 10/14/2020 04:00  Total CHOL/HDL Ratio Latest Units: RATIO 3.4  Cholesterol Latest Ref Range: 0 - 200 mg/dL 134  HDL Cholesterol Latest Ref Range: >40 mg/dL 39 (L)  LDL (calc) Latest Ref Range: 0 - 99 mg/dL 71  Triglycerides Latest Ref Range: <150 mg/dL 121  VLDL Latest Ref Range: 0 - 40 mg/dL 24   MR brain without contrast 10/14/20 Acute cortical/subcortical infarcts of the left parietal and occipital lobes. No hemorrhage.  Small chronic left occipital and right frontal infarcts.  Chronic microvascular ischemic changes.   Impression: 70 year old white female  with history outlined above, admitted with acute onset of confusion and disorientation after newly starting chemotherapy in the setting of ESRD on HD. MRI shows acute infarcts at left parietal and occipital lobes.   Recommendations: - complete stroke workup to include repeat echocardiogram when pt extubated.  Start aspirin 81mg  and plavix 75mg  daily.  Consider statin therapy Maintain normotension. PT/OT/ST when appropriate Will continue to follow.    Discussed with Dr. Merlene Laughter.    This patient is critically ill due to stroke and at significant risk of neurological worsening, death form severe anemia, bleeding, recurrent stroke, intracranial stenosis. This patient's care requires constant monitoring of vital signs, hemodynamics, respiratory and cardiac monitoring, review of multiple databases, neurological assessment, other specialists and medical decision making of high complexity. I spent 35 minutes of neurocritical care time in the care of this patient the case is discussed at length with the son and critical care team.  Encephalopathy likely multifactorial including moderate size left temporal infarct, cardiac arrest, chemotherapy agents and acute medical illness.

## 2020-10-15 NOTE — Progress Notes (Signed)
Pharmacy Antibiotic Note  [name] is a 70 y.o. female admitted on 10/18/2020   Now concern for sepsis  To start crrt  Plan: Add cefepime 2 g q12h Add vanc 750 mg q24 Monitor cx LOT, lvls prn Adjust above when off CRRT  Height: 5\' 7"  (170.2 cm) Weight: 91.1 kg (200 lb 13.4 oz) IBW/kg (Calculated) : 61.6  Temp (24hrs), Avg:98.5 F (36.9 C), Min:98.2 F (36.8 C), Max:99.1 F (37.3 C)  Recent Labs  Lab 10/11/20 1506 09/25/2020 1347 10/15/2020 1403 09/25/2020 2153 10/14/20 0230 10/14/20 0400 10/15/20 0325 10/15/20 0559  WBC 6.6 7.7  --  12.1*  --  15.2* 12.6*  --   CREATININE 5.82* 4.84* 5.00* 5.26*  --  5.82* 6.62*  --   LATICACIDVEN  --   --   --  9.5* 3.1*  --   --  2.4*    Estimated Creatinine Clearance: 9.2 mL/min (A) (by C-G formula based on SCr of 6.62 mg/dL (H)).    Allergies  Allergen Reactions  . Latex Itching  . Penicillins Other (See Comments)    UNSPECIFIED CHILDHOOD REACTION  Has patient had a PCN reaction causing immediate rash, facial/tongue/throat swelling, SOB or lightheadedness with hypotension: Unknown Has patient had a PCN reaction causing severe rash involving mucus membranes or skin necrosis: Unknown Has patient had a PCN reaction that required hospitalization: Unknown Has patient had a PCN reaction occurring within the last 10 years: Unknown If all of the above answers are "NO", then may proceed with Cephalosporin use.   Dorma Russell [Tape] Rash   Barth Kirks, PharmD, BCCCP Clinical Pharmacist [phone]  Please check AMION for all [place] numbers  10/15/2020 1:07 PM

## 2020-10-15 NOTE — Procedures (Signed)
Central Venous Catheter Insertion Procedure Note  [name]  [id number]  [birth year]  Date:10/15/20  Time:11:02 AM   Provider Performing:Jasira Robinson Alfredo Martinez   Procedure: Insertion of Non-tunneled Central Venous Catheter(36556)with US guidance (01749)    Indication(s) Hemodialysis  Consent Risks of the procedure as well as the alternatives and risks of each were explained to the patient and/or caregiver.  Consent for the procedure was obtained and is signed in the bedside chart  Anesthesia Topical only with 1% lidocaine   Timeout Verified patient identification, verified procedure, site/side was marked, verified correct patient position, special equipment/implants available, medications/allergies/relevant history reviewed, required imaging and test results available.  Sterile Technique Maximal sterile technique including full sterile barrier drape, hand hygiene, sterile gown, sterile gloves, mask, hair covering, sterile ultrasound probe cover (if used).  Procedure Description Area of catheter insertion was cleaned with chlorhexidine and draped in sterile fashion.   With real-time ultrasound guidance a HD catheter was placed into the left internal jugular vein.  Nonpulsatile blood flow and easy flushing noted in all ports.  The catheter was sutured in place and sterile dressing applied.  Complications/Tolerance None; patient tolerated the procedure well. Chest X-ray is ordered to verify placement for internal jugular or subclavian cannulation.  Chest x-ray is not ordered for femoral cannulation.  EBL Minimal  Specimen(s) None   Procedure performed under direct supervision of Dr. Erin Fulling and with ultrasound guidance for real time vessel cannulation.       [name], MSN, APRN, NP-C, AGACNP-BC [facility] 10/15/2020, 11:02 AM   Please see [email or web address] for pager details.   From 7A-7P if no response, please call [phone] After hours, please call ELink  [phone]

## 2020-10-15 NOTE — Progress Notes (Addendum)
eLink Physician-Brief Progress Note Patient Name: [name] DOB: [birth year] MRN: [id number]   Date of Service  10/15/2020  HPI/Events of Note  please view  CXR this AM, Mag 1.9 and k+ 5.4 with GFR 6    on vent with chest tube, NG and Implanted port  CxR: reviewed.  Radiology also called about worsening pneumo on left as below.   ESRD gets HD.  Discussed with bed side RN.   eICU Interventions  s/p left chest tube for pneumo on 25 th. AM CxR showing re opening of left apical pneumo-moderate with worsening sub cut emphysema. on Vent. Low DBP 49, sbp 130. HR 88, sats good. 490/10/11/38%. can  you take a look , will nned second chest tube to left apex area and changing Vent to PCV.   To notify Nephrology team- going for CRRT today in AM   Notified bed side ground team to evaluate      Intervention Category Intermediate Interventions: Electrolyte abnormality - evaluation and management  Elmer Sow 10/15/2020, 5:40 AM

## 2020-10-15 NOTE — Procedures (Signed)
Insertion of Chest Tube Procedure Note  [name]  [id number]  10-Apr-1951  Date:10/15/20  Time:10:12 AM    Provider Performing: Freddi Starr   Procedure: Chest Tube Insertion [phone])  Indication(s) Pneumothorax  Consent Risks of the procedure as well as the alternatives and risks of each were explained to the patient and/or caregiver.  Consent for the procedure was obtained and is signed in the bedside chart  Anesthesia Topical only with 1% lidocaine    Time Out Verified patient identification, verified procedure, site/side was marked, verified correct patient position, special equipment/implants available, medications/allergies/relevant history reviewed, required imaging and test results available.   Sterile Technique Maximal sterile technique including full sterile barrier drape, hand hygiene, sterile gown, sterile gloves, mask, hair covering, sterile ultrasound probe cover (if used).   Procedure Description Ultrasound not used to identify appropriate pleural anatomy for placement and overlying skin marked. Area of placement cleaned and draped in sterile fashion.  A 14 French pigtail pleural catheter was placed into the left pleural space (anterior location) using Seldinger technique. Appropriate return of air was obtained.  The tube was connected to atrium and placed on -20 cm H2O wall suction.   Complications/Tolerance None; patient tolerated the procedure well. Chest X-ray is ordered to verify placement.   EBL Minimal  Specimen(s) none

## 2020-10-16 ENCOUNTER — Telehealth: Payer: Self-pay | Admitting: Podiatry

## 2020-10-16 ENCOUNTER — Inpatient Hospital Stay (HOSPITAL_COMMUNITY): Payer: Medicare Other

## 2020-10-16 ENCOUNTER — Encounter (HOSPITAL_COMMUNITY): Payer: Self-pay | Admitting: Pulmonary Disease

## 2020-10-16 DIAGNOSIS — Z9911 Dependence on respirator [ventilator] status: Secondary | ICD-10-CM

## 2020-10-16 DIAGNOSIS — R579 Shock, unspecified: Secondary | ICD-10-CM

## 2020-10-16 DIAGNOSIS — L8990 Pressure ulcer of unspecified site, unspecified stage: Secondary | ICD-10-CM | POA: Insufficient documentation

## 2020-10-16 DIAGNOSIS — G459 Transient cerebral ischemic attack, unspecified: Secondary | ICD-10-CM | POA: Diagnosis not present

## 2020-10-16 DIAGNOSIS — R9431 Abnormal electrocardiogram [ECG] [EKG]: Secondary | ICD-10-CM | POA: Diagnosis not present

## 2020-10-16 DIAGNOSIS — J9601 Acute respiratory failure with hypoxia: Secondary | ICD-10-CM | POA: Diagnosis not present

## 2020-10-16 DIAGNOSIS — C189 Malignant neoplasm of colon, unspecified: Secondary | ICD-10-CM | POA: Diagnosis not present

## 2020-10-16 LAB — RENAL FUNCTION PANEL
Albumin: 2.6 g/dL — ABNORMAL LOW (ref 3.5–5.0)
Anion gap: 12 (ref 5–15)
BUN: 33 mg/dL — ABNORMAL HIGH (ref 8–23)
CO2: 25 mmol/L (ref 22–32)
Calcium: 8.4 mg/dL — ABNORMAL LOW (ref 8.9–10.3)
Chloride: 99 mmol/L (ref 98–111)
Creatinine, Ser: 4.17 mg/dL — ABNORMAL HIGH (ref 0.44–1.00)
GFR, Estimated: 11 mL/min — ABNORMAL LOW (ref >60)
Glucose, Bld: 169 mg/dL — ABNORMAL HIGH (ref 70–99)
Phosphorus: 5 mg/dL — ABNORMAL HIGH (ref 2.5–4.6)
Potassium: 4.5 mmol/L (ref 3.5–5.1)
Sodium: 136 mmol/L (ref 135–145)

## 2020-10-16 LAB — CBC
HCT: 26.5 % — ABNORMAL LOW (ref 36.0–46.0)
Hemoglobin: 8.8 g/dL — ABNORMAL LOW (ref 12.0–15.0)
MCH: 33.3 pg (ref 26.0–34.0)
MCHC: 33.2 g/dL (ref 30.0–36.0)
MCV: 100.4 fL — ABNORMAL HIGH (ref 80.0–100.0)
Platelets: 100 10*3/uL — ABNORMAL LOW (ref 150–400)
RBC: 2.64 MIL/uL — ABNORMAL LOW (ref 3.87–5.11)
RDW: 15.9 % — ABNORMAL HIGH (ref 11.5–15.5)
WBC: 7.5 10*3/uL (ref 4.0–10.5)
nRBC: 0 % (ref 0.0–0.2)

## 2020-10-16 LAB — HEMOGLOBIN A1C
Hgb A1c MFr Bld: 6.4 % — ABNORMAL HIGH (ref 4.8–5.6)
Mean Plasma Glucose: 136.98 mg/dL

## 2020-10-16 LAB — GLUCOSE, CAPILLARY
Glucose-Capillary: 120 mg/dL — ABNORMAL HIGH (ref 70–99)
Glucose-Capillary: 231 mg/dL — ABNORMAL HIGH (ref 70–99)
Glucose-Capillary: 312 mg/dL — ABNORMAL HIGH (ref 70–99)
Glucose-Capillary: 415 mg/dL — ABNORMAL HIGH (ref 70–99)

## 2020-10-16 LAB — MAGNESIUM: Magnesium: 2.5 mg/dL — ABNORMAL HIGH (ref 1.7–2.4)

## 2020-10-16 MED ORDER — INSULIN GLARGINE 100 UNIT/ML ~~LOC~~ SOLN
20.0000 [IU] | Freq: Two times a day (BID) | SUBCUTANEOUS | Status: DC
  Administered 2020-10-16: 20 [IU] via SUBCUTANEOUS
  Filled 2020-10-16 (×2): qty 0.2

## 2020-10-16 MED ORDER — CHLORHEXIDINE GLUCONATE 0.12 % MT SOLN
OROMUCOSAL | Status: AC
  Filled 2020-10-16: qty 15

## 2020-10-16 MED ORDER — CHLORHEXIDINE GLUCONATE CLOTH 2 % EX PADS
6.0000 | MEDICATED_PAD | Freq: Every day | CUTANEOUS | Status: DC
  Administered 2020-10-16 – 2020-10-17 (×2): 6 via TOPICAL

## 2020-10-16 MED ORDER — INSULIN GLARGINE 100 UNIT/ML ~~LOC~~ SOLN
35.0000 [IU] | Freq: Every day | SUBCUTANEOUS | Status: DC
  Filled 2020-10-16: qty 0.35

## 2020-10-16 MED ORDER — VANCOMYCIN HCL 1000 MG/200ML IV SOLN
1000.0000 mg | INTRAVENOUS | Status: DC
  Administered 2020-10-16: 1000 mg via INTRAVENOUS
  Filled 2020-10-16 (×2): qty 200

## 2020-10-16 NOTE — Progress Notes (Signed)
[facility] Progress Note    Assessment/ Plan:   ESRD: Outpatient orders: [facility]TS. 3 hrs 45 min, 2K, 2Ca, Na 137, Bicarbonate 35, Dialyzer: F180. UF profile 2. heparin 5k unit bolus with 1k bolus mid-run -appreciate CCM's assistance with temp line placement 3/27. -Started CVVHDF 3/27. Goal of net neg 50-100cc/hr as tolerated; wouldn't increase pressor support to facilitate UF.  VF Cardiac Arrest 3/25 (in-hospital arrest) -likely related to qtc prolongation? mgmt per cardiology who have consulted  Left Pneumothorax, now with worsening subq emyphsema -s/p left chest tube placement x2, per ccm.  Adenocarcinoma of the colon status post hemicolectomy on palliative chemo with FOLFOX and 5-FU (received first treatment on 3/24) -Supportive care at the moment. Has a rt chest port, 5-FU has been removed from port  Shock -pressor support and antibiotics per primary -Cultures to date are negative  Volume/ chronic hypotension: EDW 87kg. On midodrine on HD days (holding midodrine since she is on pressors)  Anemia of Chronic Kidney Disease: Hemoglobin 8.8. monitor for now  Secondary Hyperparathyroidism/Hyperphosphatemia: monitor for now (refuses binders as an outpatient, maintained on tums). Phos should improve with the initiation of CRRT  Vascular access: lue avf clotted(likely related to shock) on 3/26, has L IJ temp HD catheter  Uncontrolled DM 2 -Management per primary   [name] Hick MD [facility] Pager [phone]     Subjective:   S/p 2nd chest tube placement.  Started CRRT yesterday - new filter within a few hrs due to positional catheter but then ran most of night.  RN to make me aware if ongoing filter issues today. I/Os yesterday 3.2 / 2.3. Son bedside - aware of gravity of situation and does not want to prolong suffering; PCCM to meet with him today.    Objective:   BP (!) 149/16   Pulse 68   Temp 97.8 F (36.6 C) (Oral)    Resp (!) 22   Ht 5' 7"  (1.702 m)   Wt 92.8 kg   SpO2 100%   BMI 32.04 kg/m   Intake/Output Summary (Last 24 hours) at 10/16/2020 4536 Last data filed at 10/16/2020 0900 Gross per 24 hour  Intake 3258.64 ml  Output 2722 ml  Net 536.64 ml   Weight change: 1.7 kg  Physical Exam Gen:ill appearing CVS:reg rate Resp: intubated, L chest tubes noted IWO:EHOZY, soft Ext:+edema bl le's Neuro: sedated Dialysis access: lue avf clotted  Imaging: DG Chest 1 View  Result Date: 10/15/2020 CLINICAL DATA:  Central line placement EXAM: CHEST  1 VIEW COMPARISON:  Portable exam 1057 hours compared to 0516 hours FINDINGS: Tip of endotracheal tube projects 3.3 cm above carina. Nasogastric tube extends into abdomen. Two LEFT thoracostomy tubes present, pigtail new since prior study. RIGHT jugular Port-A-Cath with tip projecting over cavoatrial junction. New LEFT jugular central venous catheter with tip projecting over SVC. Tips of lung apices excluded. Normal heart size and mediastinal contours. Extensive chest wall pneumatosis projects over thorax bilaterally. Linear gas collection at medial inferior LEFT hemithorax likely medial LEFT pneumothorax. LEFT pneumothorax is significantly decreased following second chest tube insertion. Central peribronchial thickening and bibasilar atelectasis versus infiltrate unchanged. IMPRESSION: Decrease in LEFT pneumothorax following second thoracostomy tube insertion. New LEFT jugular line with tip projecting over SVC. Persistent bronchitic changes and bibasilar atelectasis versus consolidation. Extensive chest wall emphysema. Electronically Signed   By: Lavonia Dana M.D.   On: 10/15/2020 11:16   MR BRAIN WO CONTRAST  Result Date: 10/14/2020 CLINICAL DATA:  TIA  EXAM: MRI HEAD WITHOUT CONTRAST TECHNIQUE: Multiplanar, multiecho pulse sequences of the brain and surrounding structures were obtained without intravenous contrast. COMPARISON:  None. FINDINGS: Brain:  Cortical/subcortical reduced diffusion in the left parietal and occipital lobes. No evidence of hemorrhage. There is no intracranial mass or significant mass effect. Patchy foci of T2 hyperintensity in the supratentorial and pontine white matter are nonspecific may reflect mild to moderate chronic microvascular ischemic changes. Prominence of the ventricles and sulci reflects generalized parenchymal volume loss. Chronic parasagittal left occipital infarct. Small chronic right middle frontal gyrus infarct. There is no hydrocephalus or extra-axial fluid collection. Vascular: Major vessel flow voids at the skull base are preserved. Skull and upper cervical spine: Normal marrow signal is preserved. Sinuses/Orbits: Minor mucosal thickening.  Orbits are unremarkable. Other: Sella is unremarkable.  Mastoid air cells are clear. IMPRESSION: Acute cortical/subcortical infarcts of the left parietal and occipital lobes. No hemorrhage. Small chronic left occipital and right frontal infarcts. Chronic microvascular ischemic changes. Electronically Signed   By: Macy Mis M.D.   On: 10/14/2020 13:55   DG CHEST PORT 1 VIEW  Result Date: 10/16/2020 CLINICAL DATA:  Acute respiratory failure, hypoxia EXAM: PORTABLE CHEST 1 VIEW COMPARISON:  10/15/2020 FINDINGS: Endotracheal tube is seen 2.4 cm above the carina. Nasogastric tube extends into the upper abdomen. Left internal jugular hemodialysis catheter tip is unchanged when accounting for changes in patient positioning with its tip overlying the brachiocephalic vein. Right internal jugular chest port tip noted within the right atrium. Left apical pigtail chest tube is unchanged. Additional left mid lung zone large bore chest tube is again noted. There is increasing lucency involving the left hemithorax which may relate to increasing left basilar collapse and compensatory hyperinflation of the left upper lobe. An anterior, loculated pneumothorax could result in a similar  appearance, but is considered less likely. No definite pneumothorax is identified. There is increasing bibasilar atelectasis or infiltrate. Cardiac size within normal limits. Extensive gas within the left chest wall again identified. IMPRESSION: Stable support lines and tubes. Increasing lucency of the left hemithorax likely related to hyperinflation of the left upper lobe in the setting of increasing left basilar atelectasis or infiltrate. While and anteriorly loculated pneumothorax could result in a similar appearance, this is considered less likely. Progressive bibasilar atelectasis or infiltrate. Electronically Signed   By: Fidela Salisbury MD   On: 10/16/2020 07:16   DG CHEST PORT 1 VIEW  Result Date: 10/15/2020 CLINICAL DATA:  Acute respiratory failure with hypoxia EXAM: PORTABLE CHEST 1 VIEW COMPARISON:  10/14/2020 FINDINGS: Indwelling left chest tube. Moderate left pneumothorax, progressive. Extensive subcutaneous emphysema predominantly over the left chest wall, progressive. Patchy bilateral lower lobe opacities, likely atelectasis. No pleural effusions. Endotracheal tube terminates 4.8 cm above the carina. Right chest power port terminates the cavoatrial junction. Enteric tube courses into the stomach. The heart is normal in size. IMPRESSION: Moderate left pneumothorax, progressive. Indwelling left chest tube. Extensive subcutaneous emphysema, progressive. These results will be called to the ordering clinician or representative by the Radiologist Assistant, and communication documented in the PACS or [employer]. Electronically Signed   By: Julian Hy M.D.   On: 10/15/2020 05:43   ECHOCARDIOGRAM LIMITED  Result Date: 10/14/2020    ECHOCARDIOGRAM REPORT   Patient Name:   [name] Date of Exam: 10/14/2020 Medical Rec #:  [id number]     Height:       67.0 in Accession #:    [id number]    Weight:  195.3 lb Date of Birth:  January 08, 1951     BSA:          2.002 m Patient Age:    27 years       BP:           120/70 mmHg Patient Gender: F             HR:           81 bpm. Exam Location:  Inpatient Procedure: Limited Echo, Color Doppler and Cardiac Doppler Indications:    stroke  History:        Patient has prior history of Echocardiogram examinations, most                 recent 03/02/2018. Cancer. end stage renal disease.,                 Arrythmias:Ventricular Fibrillation; Risk Factors:Hypertension.  Sonographer:    Johny Chess Referring Phys: [id number] [facility]  Sonographer Comments: Technically difficult study due to poor echo windows, Technically challenging study due to limited acoustic windows, suboptimal parasternal window and echo performed with patient supine and on artificial respirator. Image acquisition challenging due to patient body habitus and Image acquisition challenging due to respiratory motion. IMPRESSIONS  1. Extremely limited; LV function ? preserved; no pericardial effusion; no other information could be obtained with this study; suggest TEE if clinically indicated.  2. Left ventricular ejection fraction, by estimation, is Cannot be assessed%. The left ventricle has Cannot be assessed function. Left ventricular endocardial border not optimally defined to evaluate regional wall motion. The left ventricular internal cavity size was Not assessed. There is Not assessed left ventricular hypertrophy. Left ventricular diastolic function could not be evaluated.  3. Right ventricular systolic function is normal. The right ventricular size is normal.  4. The mitral valve was not assessed. Not fully assessed mitral valve regurgitation.  5. Tricuspid valve regurgitation Not fully assessed.  6. The aortic valve was not assessed. Aortic valve regurgitation Not assessed.  7. Pulmonic valve regurgitation Not assessed. FINDINGS  Left Ventricle: Left ventricular ejection fraction, by estimation, is Cannot be assessed%. The left ventricle has Cannot be assessed function. Left ventricular  endocardial border not optimally defined to evaluate regional wall motion. The left ventricular internal cavity size was Not assessed. There is Not assessed left ventricular hypertrophy. Left ventricular diastolic function could not be evaluated. Right Ventricle: The right ventricular size is normal. Right vetricular wall thickness was not assessed. Right ventricular systolic function is normal. Left Atrium: Left atrial size was not assessed. Right Atrium: Right atrial size was not assessed. Pericardium: There is no evidence of pericardial effusion. Mitral Valve: The mitral valve was not assessed. Not fully assessed mitral valve regurgitation. Tricuspid Valve: The tricuspid valve is not assessed. Tricuspid valve regurgitation Not fully assessed. No evidence of tricuspid stenosis. Aortic Valve: The aortic valve was not assessed. Aortic valve regurgitation Not assessed. Pulmonic Valve: The pulmonic valve was not assessed. Pulmonic valve regurgitation Not assessed. Aorta: Aortic root could not be assessed. Venous: The inferior vena cava was not well visualized.  Additional Comments: Extremely limited; LV function ? preserved; no pericardial effusion; no other information could be obtained with this study; suggest TEE if clinically indicated.  MV E velocity: 54.40 cm/s MV A velocity: 83.10 cm/s MV E/A ratio:  0.65 Kirk Ruths MD Electronically signed by Kirk Ruths MD Signature Date/Time: 10/14/2020/2:08:03 PM    Final     Labs: BMET Recent Labs  Lab  09/29/2020 1347 10/06/2020 1403 10/09/2020 2153 10/08/2020 2218 10/14/20 0400 10/14/20 1124 10/14/20 2045 10/15/20 0325 10/15/20 1151 10/15/20 1155 10/15/20 1415 10/16/20 0410  NA 139 139 140 138 137  --   --  134* 135 136 137 136  K 4.4 4.7 4.7 4.5 4.9  --   --  5.4* 5.6* 5.7* 5.2* 4.5  CL 99 98 101  --  98  --   --  96*  --  96* 98 99  CO2 28  --  21*  --  24  --   --  22  --  26 27 25   GLUCOSE 118* 119* 217*  --  265*  --   --  324*  --  295* 299*  169*  BUN 21 20 22   --  26*  --   --  37*  --  46* 44* 33*  CREATININE 4.84* 5.00* 5.26*  --  5.82*  --   --  6.62*  --  7.39* 6.80* 4.17*  CALCIUM 8.9  --  9.2  --  8.9  --   --  8.1*  --  8.2* 8.1* 8.4*  PHOS  --   --  6.7*  --   --  8.2* 8.7* 8.4*  --   --  7.3*  7.6* 5.0*   CBC Recent Labs  Lab 10/11/20 1506 10/11/20 1506 09/24/2020 1347 10/12/2020 1403 09/20/2020 2153 09/28/2020 2218 10/14/20 0400 10/15/20 0325 10/15/20 1151 10/16/20 0410  WBC 6.6   < > 7.7  --  12.1*  --  15.2* 12.6*  --  7.5  NEUTROABS 4.6  --  5.8  --   --   --   --   --   --   --   HGB 9.9*  --  9.2*   < > 11.4*   < > 9.9* 9.9* 8.8* 8.8*  HCT 29.7*  --  27.9*   < > 33.9*   < > 29.6* 30.0* 26.0* 26.5*  MCV 99.3  --  100.0  --  100.3*  --  99.3 99.0  --  100.4*  PLT 162   < > 148*  --  169  --  259 173  --  100*   < > = values in this interval not displayed.    Medications:    . aspirin  81 mg Per Tube Daily   Or  . aspirin  300 mg Rectal Daily  . budesonide (PULMICORT) nebulizer solution  0.5 mg Nebulization BID  . chlorhexidine gluconate (MEDLINE KIT)  15 mL Mouth Rinse BID  . Chlorhexidine Gluconate Cloth  6 each Topical Daily  . clopidogrel  75 mg Per Tube Daily  . docusate  100 mg Per Tube BID  . feeding supplement (PROSource TF)  90 mL Per Tube TID  . heparin  5,000 Units Subcutaneous Q8H  . insulin aspart  0-20 Units Subcutaneous Q4H  . insulin aspart  4 Units Subcutaneous Q4H  . insulin glargine  35 Units Subcutaneous QHS  . mouth rinse  15 mL Mouth Rinse 10 times per day  . pantoprazole sodium  40 mg Per Tube Q1200  . polyethylene glycol  17 g Per Tube Daily  . rosuvastatin  20 mg Per Tube Daily  . senna  1 tablet Per Tube BID  . sodium chloride flush  10-40 mL Intracatheter Q12H

## 2020-10-16 NOTE — Consult Note (Signed)
[place] Nurse ostomy follow up Patient receiving care in [facility] 2M13. Patient is known to the [place] service from her recent admission.  Please order and keep at the bedside the following supplies: 2pc.Pouch Kellie Simmering # [phone]) Skin barrier Kellie Simmering # [phone]) Barrier ring Kellie Simmering # [phone]).  Bedside nurse to perform. [place] nurse will not follow at this time.  Please re-consult the [place] team if needed.  Val Riles, RN, MSN, CWOCN, CNS-BC, pager [phone]

## 2020-10-16 NOTE — Consult Note (Signed)
[facility]  [phone] Fax:([phone] [place]  Reason for Consultation: Metastatic colon cancer  HPI: Ms. Calaway is a 70 year old female with a past medical history significant for recently diagnosed stage IV colon cancer, history of renal cell cancer, end-stage renal disease, diabetes mellitus type 2, hypertension, hyperlipidemia, chronic diastolic CHF, chronic anemia, depression.  The patient presented to the hospital with disorientation and confusion.  On admission labs were remarkable for hemoglobin of 9.2.  Neurology was consulted who felt symptoms could be related to his acute ischemic stroke versus medication side effect from chemotherapy versus seizure.  She had ventricular fibrillation cardiac arrest due to prolonged QTC and was transferred to the ICU, intubated, and left chest tube was placed for pneumothorax or resuscitative efforts.  HD catheter was placed for CRRT. MRI brain showed acute cortical/subcortical infarcts of the left parietal and occipital lobes, no hemorrhage, small chronic left occipital and right frontal infarcts likely sequelae of cardiac arrest.  The patient was seen in her ICU room.  No family was at the bedside.  Remains intubated, critically ill, mitts in place.  Intubated, sedated.  Medical oncology was asked see the patient make recommendations regarding her metastatic colon cancer.   Past Medical History:  Diagnosis Date  . Anemia   . Anemia in chronic kidney disease 09/29/2018  . Arthritis   . Asthma   . Blood transfusion without reported diagnosis    Phreesia 02/27/2020  . Cataract    OD  . Chronic kidney disease    Phreesia 02/27/2020  . Coronary artery calcification seen on CAT scan 02/17/2018   Coronary calcification on CT  . Depression   . Diabetic retinopathy of both eyes ([place])   . Diabetic ulcer of right foot associated with type 2 diabetes mellitus ([place])   . ESRD (end stage renal disease)  on dialysis ([place])   . Essential hypertension 02/17/2018   Essential hypertension  . HLD (hyperlipidemia)   . Hypertension   . Left ventricular dysfunction 04/07/2018   Left ventricle dysfunction  . Microalbuminuria due to type 2 diabetes mellitus ([place])   . Nonsustained ventricular tachycardia ([place]) 08/28/2018   Nonsustained ventricular tachycardia  . Pneumonia    hx of   . Renal cell cancer ([place])   . Right renal mass 03/10/2017  . Ulcer of other part of foot 06/22/2013  . 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 AMPUTATION TOE Right 10/29/2019   Procedure: RIGHT 2ND TOE AMPUTATION;  Surgeon: Edrick Kins, DPM;  Location: [facility];  Service: Podiatry;  Laterality: Right;  . AMPUTATION TOE Bilateral 09/04/2020   Procedure: RIGHT THIRD DIGIT AMPUTATION;  Surgeon: Edrick Kins, DPM;  Location: [facility];  Service: Podiatry;  Laterality: Bilateral;  . BASCILIC VEIN TRANSPOSITION Left 08/08/2017   Procedure: BASILIC VEIN TRANSPOSITION FIRST STAGE LEFT ARM;  Surgeon: Serafina Mitchell, MD;  Location: [facility];  Service: Vascular;  Laterality: Left;  . BASCILIC VEIN TRANSPOSITION Left 05/14/2018   Procedure: SECOND STAGE BASILIC VEIN TRANSPOSITION LEFT ARM;  Surgeon: Serafina Mitchell, MD;  Location: [facility];  Service: Vascular;  Laterality: Left;  . BIOPSY  08/29/2020   Procedure: BIOPSY;  Surgeon: Yetta Flock, MD;  Location: [facility];  Service: Gastroenterology;;  . CATARACT EXTRACTION Left   . COLONOSCOPY WITH PROPOFOL N/A 08/29/2020   Procedure: COLONOSCOPY WITH PROPOFOL;  Surgeon: Yetta Flock,  MD;  Location: [facility];  Service: Gastroenterology;  Laterality: N/A;  . EYE SURGERY    . ILEOSTOMY Right 08/30/2020   Procedure: ILEOSTOMY;  Surgeon: Jesusita Oka, MD;  Location: [facility];  Service: General;  Laterality: Right;  . IR IMAGING GUIDED PORT INSERTION  10/04/2020  . PARTIAL COLECTOMY N/A 08/30/2020    Procedure: HEMI COLECTOMY;  Surgeon: Jesusita Oka, MD;  Location: [facility];  Service: General;  Laterality: N/A;  . POLYPECTOMY  08/29/2020   Procedure: POLYPECTOMY;  Surgeon: Yetta Flock, MD;  Location[facility];  Service: Gastroenterology;;  . ROBOTIC ADRENALECTOMY Left 03/10/2017   Procedure: XI ROBOTIC ADRENALECTOMY;  Surgeon: Nickie Retort, MD;  Location: [facility];  Service: Urology;  Laterality: Left;  . ROBOTIC ASSITED PARTIAL NEPHRECTOMY Right 03/10/2017   Procedure: XI ROBOTIC ASSITED RADICAL NEPHRECTOMY;  Surgeon: Nickie Retort, MD;  Location: [facility];  Service: Urology;  Laterality: Right;  . SUBMUCOSAL TATTOO INJECTION  08/29/2020   Procedure: SUBMUCOSAL TATTOO INJECTION;  Surgeon: Yetta Flock, MD;  Location: [facility];  Service: Gastroenterology;;  . TOE AMPUTATION Bilateral    both great toe ,, left foot 2nd toe 1/2  . TOE ARTHROPLASTY  09/04/2020   Procedure: ARTHROPLASTY OF THE THIRD DIGIT LEFT FOOT;  Surgeon: Edrick Kins, DPM;  Location: [facility];  Service: Podiatry;;  :  Current Facility-Administered Medications  Medication Dose Route Frequency Provider Last Rate Last Admin  . acetaminophen (TYLENOL) tablet 650 mg  650 mg Per Tube Q6H PRN Steenwyk, Yujing Z, RPH       Or  . acetaminophen (TYLENOL) suppository 650 mg  650 mg Rectal Q6H PRN Steenwyk, Yujing Z, RPH      . albuterol (PROVENTIL) (2.5 MG/3ML) 0.083% nebulizer solution 2.5 mg  2.5 mg Nebulization Q6H PRN Dahal, Binaya, MD      . amiodarone (NEXTERONE PREMIX) 360-4.14 MG/200ML-% (1.8 mg/mL) IV infusion  30 mg/hr Intravenous Continuous Freda Jackson B, MD 16.67 mL/hr at 10/16/20 1500 30 mg/hr at 10/16/20 1500  . aspirin chewable tablet 81 mg  81 mg Per Tube Daily Steenwyk, Yujing Z, RPH   81 mg at 10/16/20 0913   Or  . aspirin suppository 300 mg  300 mg Rectal Daily Steenwyk, Yujing Z, RPH      . budesonide (PULMICORT) nebulizer solution 0.5 mg  0.5 mg Nebulization BID Alfredo Martinez, Brandi L,  NP   0.5 mg at 10/16/20 0853  . ceFEPIme (MAXIPIME) 2 g in sodium chloride 0.9 % 100 mL IVPB  2 g Intravenous Q12H Freddi Starr, MD   Stopped at 10/16/20 1000  . chlorhexidine gluconate (MEDLINE KIT) (PERIDEX) 0.12 % solution 15 mL  15 mL Mouth Rinse BID Freddi Starr, MD   15 mL at 10/16/20 0740  . Chlorhexidine Gluconate Cloth 2 % PADS 6 each  6 each Topical Daily Freda Jackson B, MD      . clopidogrel (PLAVIX) tablet 75 mg  75 mg Per Tube Daily Steenwyk, Yujing Z, RPH   75 mg at 10/16/20 0914  . docusate (COLACE) 50 MG/5ML liquid 100 mg  100 mg Per Tube BID Olalere, Adewale A, MD   100 mg at 10/16/20 0914  . feeding supplement (PROSource TF) liquid 90 mL  90 mL Per Tube TID Freddi Starr, MD   90 mL at 10/16/20 0913  . feeding supplement (VITAL 1.5 CAL) liquid 1,000 mL  1,000 mL Per Tube Continuous Freddi Starr, MD 45 mL/hr at  10/15/20 1923 1,000 mL at 10/15/20 1923  . fentaNYL (SUBLIMAZE) injection 25-100 mcg  25-100 mcg Intravenous Q30 min PRN Olalere, Adewale A, MD   25 mcg at 10/15/20 1631  . fentaNYL 2540mg in NS 2536m(1023mml) infusion-PREMIX  0-200 mcg/hr Intravenous Continuous OgaFrederik PearD   Stopped at 10/16/20 1144  . heparin injection 1,000-6,000 Units  1,000-6,000 Units CRRT PRN SinGean QuintD   2,800 Units at 10/16/20 1429  . heparin injection 5,000 Units  5,000 Units Subcutaneous Q8H Dahal, Binaya, MD   5,000 Units at 10/16/20 1412  . hydrALAZINE (APRESOLINE) injection 10 mg  10 mg Intravenous Q6H PRN Dahal, Binaya, MD      . insulin aspart (novoLOG) injection 0-20 Units  0-20 Units Subcutaneous Q4H Ollis, Brandi L, NP   20 Units at 10/16/20 1542  . insulin aspart (novoLOG) injection 4 Units  4 Units Subcutaneous Q4H Ollis, Brandi L, NP   4 Units at 10/16/20 1542  . insulin glargine (LANTUS) injection 20 Units  20 Units Subcutaneous BID ClaJulian HyO   20 Units at 10/16/20 1414  . MEDLINE mouth rinse  15 mL Mouth Rinse 10 times per day  DewFreda Jackson MD   15 mL at 10/16/20 1200  . midazolam (VERSED) injection 1 mg  1 mg Intravenous Q2H PRN Olalere, Adewale A, MD   1 mg at 10/15/20 2110  . norepinephrine (LEVOPHED) 16 mg in 250m23memix infusion  0-40 mcg/min Intravenous Titrated DewaFreda JacksonMD 23.4 mL/hr at 10/16/20 1500 25 mcg/min at 10/16/20 1500  . ondansetron (ZOFRAN) injection 4 mg  4 mg Intravenous Q6H PRN DahaTerrilee Croak   4 mg at 09/19/2020 2030  . pantoprazole sodium (PROTONIX) 40 mg/20 mL oral suspension 40 mg  40 mg Per Tube Q1200 HoffCorey Harold   40 mg at 10/16/20 1219  . polyethylene glycol (MIRALAX / GLYCOLAX) packet 17 g  17 g Per Tube Daily Olalere, Adewale A, MD   17 g at 10/15/20 0959  . polyethylene glycol (MIRALAX / GLYCOLAX) packet 17 g  17 g Per Tube Daily PRN Steenwyk, Yujing Z, RPH      . prismasol BGK 2/2.5 dialysis solution   CRRT Continuous SingGean Quint 1,500 mL/hr at 10/16/20 1058 New Bag at 10/16/20 1058  . prismasol BGK 2/2.5 replacement solution   CRRT Continuous SingGean Quint 500 mL/hr at 10/16/20 0933 New Bag at 10/16/20 0933  . prismasol BGK 2/2.5 replacement solution   CRRT Continuous SingGean Quint 300 mL/hr at 10/16/20 0332 New Bag at 10/16/20 0332  . rosuvastatin (CRESTOR) tablet 20 mg  20 mg Per Tube Daily Dahal, Binaya, MD   20 mg at 10/16/20 0913  . senna (SENOKOT) tablet 8.6 mg  1 tablet Per Tube BID DahaTerrilee Croak   8.6 mg at 10/16/20 0915  . sodium chloride flush (NS) 0.9 % injection 10-40 mL  10-40 mL Intracatheter Q12H Dahal, BinaMarlowe Aschoff   10 mL at 10/16/20 0931  . sodium chloride flush (NS) 0.9 % injection 10-40 mL  10-40 mL Intracatheter PRN Dahal, Binaya, MD      . thiamine 500mg74mnormal saline (50ml)22mB  500 mg Intravenous TID LindzeKerney Elbe00 mL/hr at 10/16/20 1544 500 mg at 10/16/20 1544  . vancomycin (VANCOREADY) IVPB 1000 mg/200 mL  1,000 mg Intravenous Q24H Barr, Henri Medal200 mL/hr at 10/16/20 1500 Infusion Verify at 10/16/20 1500  .  vasopressin (PITRESSIN) 20  Units in sodium chloride 0.9 % 100 mL infusion-*FOR SHOCK*  0-0.03 Units/min Intravenous Continuous Ollis, Brandi L, NP 9 mL/hr at 10/16/20 1500 0.03 Units/min at 10/16/20 1500     Allergies  Allergen Reactions  . Latex Itching  . Penicillins Other (See Comments)    UNSPECIFIED CHILDHOOD REACTION  Has patient had a PCN reaction causing immediate rash, facial/tongue/throat swelling, SOB or lightheadedness with hypotension: Unknown Has patient had a PCN reaction causing severe rash involving mucus membranes or skin necrosis: Unknown Has patient had a PCN reaction that required hospitalization: Unknown Has patient had a PCN reaction occurring within the last 10 years: Unknown If all of the above answers are "NO", then may proceed with Cephalosporin use.   . Adhesive [Tape] Rash  :  Family History  Problem Relation Age of Onset  . Heart disease Mother   . Diabetes Sister   :  Social History   Socioeconomic History  . Marital status: Single    Spouse name: Not on file  . Number of children: 2  . Years of education: 46  . Highest education level: Not on file  Occupational History  . Not on file  Tobacco Use  . Smoking status: Never Smoker  . Smokeless tobacco: Never Used  Vaping Use  . Vaping Use: Never used  Substance and Sexual Activity  . Alcohol use: Yes    Alcohol/week: 0.0 standard drinks    Comment: occ  . Drug use: No  . Sexual activity: Not Currently    Partners: Male  Other Topics Concern  . Not on file  Social History Narrative  . Not on file   Social Determinants of Health   Financial Resource Strain: Low Risk   . Difficulty of Paying Living Expenses: Not very hard  Food Insecurity: Not on file  Transportation Needs: Not on file  Physical Activity: Not on file  Stress: Not on file  Social Connections: Not on file  Intimate Partner Violence: Not on file  :  Review of Systems: Unable to obtain.  Exam: Patient Vitals for  the past 24 hrs:  BP Temp Temp src Pulse Resp SpO2 Weight  10/16/20 1515 -- -- -- (!) 107 (!) 22 99 % --  10/16/20 1500 -- -- -- -- (!) 22 -- --  10/16/20 1445 -- -- -- 85 (!) 22 98 % --  10/16/20 1430 -- -- -- 75 (!) 22 100 % --  10/16/20 1415 -- -- -- 77 (!) 22 98 % --  10/16/20 1400 -- -- -- 79 (!) 22 100 % --  10/16/20 1345 -- -- -- -- (!) 22 -- --  10/16/20 1330 -- -- -- 78 (!) 22 96 % --  10/16/20 1315 -- -- -- 75 (!) 22 100 % --  10/16/20 1300 -- -- -- -- (!) 22 -- --  10/16/20 1230 -- (!) 97 F (36.1 C) Axillary 71 (!) 22 100 % --  10/16/20 1215 -- -- -- -- 19 -- --  10/16/20 1200 -- -- -- -- (!) 22 -- --  10/16/20 1145 -- -- -- -- (!) 22 -- --  10/16/20 1130 112/61 -- -- 74 (!) 22 100 % --  10/16/20 1115 -- -- -- 73 (!) 22 100 % --  10/16/20 1100 -- -- -- -- (!) 22 -- --  10/16/20 1045 -- -- -- 77 (!) 21 100 % --  10/16/20 1030 -- -- -- 73 (!) 22 100 % --  10/16/20 1015 -- -- --  72 (!) 22 99 % --  10/16/20 1000 -- -- -- 73 (!) 21 99 % --  10/16/20 0945 -- -- -- -- (!) 22 -- --  10/16/20 0930 -- -- -- -- (!) 22 -- --  10/16/20 0915 -- -- -- 72 (!) 22 99 % --  10/16/20 0900 -- -- -- 68 (!) 22 100 % --  10/16/20 0854 -- -- -- -- -- 92 % --  10/16/20 0845 -- -- -- 70 (!) 22 100 % --  10/16/20 0830 -- -- -- -- 20 -- --  10/16/20 0815 -- -- -- 71 (!) 23 100 % --  10/16/20 0800 -- -- -- 74 (!) 22 100 % --  10/16/20 0745 -- -- -- -- (!) 22 -- --  10/16/20 0730 -- 97.8 F (36.6 C) Oral -- (!) 22 -- --  10/16/20 0715 -- -- -- 73 (!) 22 100 % --  10/16/20 0700 -- -- -- 72 (!) 22 99 % --  10/16/20 0645 -- -- -- -- (!) 22 -- --  10/16/20 0630 -- -- -- 76 (!) 22 100 % --  10/16/20 0615 -- -- -- 73 (!) 22 99 % --  10/16/20 0600 -- -- -- 73 (!) 22 100 % --  10/16/20 0545 -- -- -- 74 (!) 22 100 % --  10/16/20 0530 -- -- -- -- (!) 22 -- --  10/16/20 0515 -- -- -- 72 (!) 22 100 % --  10/16/20 0500 -- -- -- -- (!) 22 -- 92.8 kg  10/16/20 0445 -- -- -- 79 (!) 22 100 % --  10/16/20  0430 -- -- -- 77 (!) 22 100 % --  10/16/20 0415 -- -- -- 77 (!) 22 100 % --  10/16/20 0400 -- -- -- 76 (!) 22 100 % --  10/16/20 0345 -- -- -- 79 (!) 22 100 % --  10/16/20 0340 -- -- -- 79 (!) 22 100 % --  10/16/20 0331 -- 97.6 F (36.4 C) Axillary -- -- -- --  10/16/20 0330 -- -- -- 77 (!) 22 99 % --  10/16/20 0315 -- -- -- 76 (!) 22 98 % --  10/16/20 0300 -- -- -- 74 18 91 % --  10/16/20 0245 -- -- -- -- (!) 22 -- --  10/16/20 0230 -- -- -- -- (!) 22 -- --  10/16/20 0215 -- -- -- -- (!) 22 -- --  10/16/20 0200 -- -- -- 76 (!) 22 97 % --  10/16/20 0145 -- -- -- 76 (!) 22 99 % --  10/16/20 0130 -- -- -- 75 (!) 22 98 % --  10/16/20 0115 -- -- -- 75 (!) 22 95 % --  10/16/20 0100 -- -- -- 74 (!) 22 95 % --  10/16/20 0045 -- -- -- 74 (!) 22 97 % --  10/16/20 0030 -- -- -- 74 (!) 22 99 % --  10/16/20 0015 -- -- -- 74 (!) 22 98 % --  10/16/20 0000 -- -- -- 72 20 92 % --  10/15/20 2345 -- -- -- 74 (!) 22 99 % --  10/15/20 2335 -- (!) 97.3 F (36.3 C) Axillary -- -- -- --  10/15/20 2330 -- -- -- 73 18 100 % --  10/15/20 2320 -- -- -- 73 (!) 22 98 % --  10/15/20 2315 -- -- -- 71 (!) 22 98 % --  10/15/20 2300 (!) 149/16 -- -- -- (!) 22 -- --  10/15/20  2245 -- -- -- 71 (!) 24 100 % --  10/15/20 2230 -- -- -- 71 (!) 23 97 % --  10/15/20 2215 -- -- -- 71 (!) 22 97 % --  10/15/20 2200 (!) 154/18 -- -- 72 (!) 23 97 % --  10/15/20 2145 -- -- -- 71 (!) 22 94 % --  10/15/20 2130 -- -- -- -- (!) 24 -- --  10/15/20 2115 -- -- -- 75 (!) 25 99 % --  10/15/20 2100 (!) 69/37 -- -- 72 (!) 22 99 % --  10/15/20 2045 -- -- -- -- (!) 22 -- --  10/15/20 2030 -- -- -- 68 (!) 22 99 % --  10/15/20 2015 -- -- -- 67 (!) 22 95 % --  10/15/20 2000 (!) 72/40 -- -- 66 (!) 22 99 % --  10/15/20 1950 -- (!) 96.3 F (35.7 C) Axillary -- -- -- --  10/15/20 1945 -- -- -- -- (!) 22 -- --  10/15/20 1944 (!) 96/24 -- -- 67 (!) 22 100 % --  10/15/20 1930 -- -- -- 69 (!) 22 100 % --  10/15/20 1915 -- -- -- 68 (!) 22 100  % --  10/15/20 1900 -- -- -- 68 (!) 22 99 % --  10/15/20 1845 -- -- -- 68 (!) 22 99 % --  10/15/20 1830 -- -- -- 68 (!) 22 99 % --  10/15/20 1815 -- -- -- 67 (!) 22 100 % --  10/15/20 1800 -- -- -- -- (!) 22 -- --  10/15/20 1745 -- -- -- -- (!) 22 -- --  10/15/20 1730 -- -- -- -- (!) 22 -- --  10/15/20 1715 -- -- -- 68 (!) 22 99 % --  10/15/20 1700 92/70 -- -- 69 (!) 22 95 % --  10/15/20 1630 -- -- -- 76 19 100 % --  10/15/20 1620 -- (!) 97.5 F (36.4 C) Axillary -- -- -- --  10/15/20 1615 -- -- -- 71 13 95 % --    General: Chronically ill-appearing female, intubated Eyes:  no scleral icterus.   ENT: ETT in place.  Respiratory: lungs were clear bilaterally without wheezing or crackles.   Cardiovascular:  Regular rate and rhythm, S1/S2, without murmur, rub or gallop.  Trace lower extremity edema bilaterally GI:  abdomen was soft, flat, nontender, nondistended, without organomegaly.   Musculoskeletal:  no spinal tenderness of palpation of vertebral spine.   Skin exam was without echymosis, petichae.   Neuro: Sedated on vent, opens eyes periodically   Lab Results  Component Value Date   WBC 7.5 10/16/2020   HGB 8.8 (L) 10/16/2020   HCT 26.5 (L) 10/16/2020   PLT 100 (L) 10/16/2020   GLUCOSE 169 (H) 10/16/2020   CHOL 134 10/14/2020   TRIG 121 10/14/2020   HDL 39 (L) 10/14/2020   LDLCALC 71 10/14/2020   ALT 61 (H) 09/29/2020   AST 91 (H) 09/29/2020   NA 136 10/16/2020   K 4.5 10/16/2020   CL 99 10/16/2020   CREATININE 4.17 (H) 10/16/2020   BUN 33 (H) 10/16/2020   CO2 25 10/16/2020    DG Chest 1 View  Result Date: 10/15/2020 CLINICAL DATA:  Central line placement EXAM: CHEST  1 VIEW COMPARISON:  Portable exam 1057 hours compared to 0516 hours FINDINGS: Tip of endotracheal tube projects 3.3 cm above carina. Nasogastric tube extends into abdomen. Two LEFT thoracostomy tubes present, pigtail new since prior study. RIGHT jugular Port-A-Cath with tip projecting  over cavoatrial  junction. New LEFT jugular central venous catheter with tip projecting over SVC. Tips of lung apices excluded. Normal heart size and mediastinal contours. Extensive chest wall pneumatosis projects over thorax bilaterally. Linear gas collection at medial inferior LEFT hemithorax likely medial LEFT pneumothorax. LEFT pneumothorax is significantly decreased following second chest tube insertion. Central peribronchial thickening and bibasilar atelectasis versus infiltrate unchanged. IMPRESSION: Decrease in LEFT pneumothorax following second thoracostomy tube insertion. New LEFT jugular line with tip projecting over SVC. Persistent bronchitic changes and bibasilar atelectasis versus consolidation. Extensive chest wall emphysema. Electronically Signed   By: Lavonia Dana M.D.   On: 10/15/2020 11:16   MR BRAIN WO CONTRAST  Result Date: 10/14/2020 CLINICAL DATA:  TIA EXAM: MRI HEAD WITHOUT CONTRAST TECHNIQUE: Multiplanar, multiecho pulse sequences of the brain and surrounding structures were obtained without intravenous contrast. COMPARISON:  None. FINDINGS: Brain: Cortical/subcortical reduced diffusion in the left parietal and occipital lobes. No evidence of hemorrhage. There is no intracranial mass or significant mass effect. Patchy foci of T2 hyperintensity in the supratentorial and pontine white matter are nonspecific may reflect mild to moderate chronic microvascular ischemic changes. Prominence of the ventricles and sulci reflects generalized parenchymal volume loss. Chronic parasagittal left occipital infarct. Small chronic right middle frontal gyrus infarct. There is no hydrocephalus or extra-axial fluid collection. Vascular: Major vessel flow voids at the skull base are preserved. Skull and upper cervical spine: Normal marrow signal is preserved. Sinuses/Orbits: Minor mucosal thickening.  Orbits are unremarkable. Other: Sella is unremarkable.  Mastoid air cells are clear. IMPRESSION: Acute cortical/subcortical  infarcts of the left parietal and occipital lobes. No hemorrhage. Small chronic left occipital and right frontal infarcts. Chronic microvascular ischemic changes. Electronically Signed   By: Macy Mis M.D.   On: 10/14/2020 13:55   DG CHEST PORT 1 VIEW  Result Date: 10/16/2020 CLINICAL DATA:  Acute respiratory failure, hypoxia EXAM: PORTABLE CHEST 1 VIEW COMPARISON:  10/15/2020 FINDINGS: Endotracheal tube is seen 2.4 cm above the carina. Nasogastric tube extends into the upper abdomen. Left internal jugular hemodialysis catheter tip is unchanged when accounting for changes in patient positioning with its tip overlying the brachiocephalic vein. Right internal jugular chest port tip noted within the right atrium. Left apical pigtail chest tube is unchanged. Additional left mid lung zone large bore chest tube is again noted. There is increasing lucency involving the left hemithorax which may relate to increasing left basilar collapse and compensatory hyperinflation of the left upper lobe. An anterior, loculated pneumothorax could result in a similar appearance, but is considered less likely. No definite pneumothorax is identified. There is increasing bibasilar atelectasis or infiltrate. Cardiac size within normal limits. Extensive gas within the left chest wall again identified. IMPRESSION: Stable support lines and tubes. Increasing lucency of the left hemithorax likely related to hyperinflation of the left upper lobe in the setting of increasing left basilar atelectasis or infiltrate. While and anteriorly loculated pneumothorax could result in a similar appearance, this is considered less likely. Progressive bibasilar atelectasis or infiltrate. Electronically Signed   By: Fidela Salisbury MD   On: 10/16/2020 07:16   DG CHEST PORT 1 VIEW  Result Date: 10/15/2020 CLINICAL DATA:  Acute respiratory failure with hypoxia EXAM: PORTABLE CHEST 1 VIEW COMPARISON:  10/14/2020 FINDINGS: Indwelling left chest tube.  Moderate left pneumothorax, progressive. Extensive subcutaneous emphysema predominantly over the left chest wall, progressive. Patchy bilateral lower lobe opacities, likely atelectasis. No pleural effusions. Endotracheal tube terminates 4.8 cm above the carina. Right chest power  port terminates the cavoatrial junction. Enteric tube courses into the stomach. The heart is normal in size. IMPRESSION: Moderate left pneumothorax, progressive. Indwelling left chest tube. Extensive subcutaneous emphysema, progressive. These results will be called to the ordering clinician or representative by the Radiologist Assistant, and communication documented in the PACS or [employer]. Electronically Signed   By: Julian Hy M.D.   On: 10/15/2020 05:43   DG CHEST PORT 1 VIEW  Result Date: 10/14/2020 CLINICAL DATA:  Chest tube placement EXAM: PORTABLE CHEST 1 VIEW COMPARISON:  10/14/2020 at 0417 hours FINDINGS: Indwelling left chest tube. A persistent trace left pneumothorax may be obscured by the overlying defibrillator pad. Right lung is essentially clear.  No definite pleural effusion. Endotracheal tube terminates 10 mm above the carina. Enteric tube courses into the proximal stomach. Right chest power port terminates at the cavoatrial junction. Mild cardiomegaly. IMPRESSION: Indwelling left chest tube. A persistent trace left pneumothorax may be obscured by the overlying defibrillator pad. Endotracheal tube terminates 10 mm above the carina. Additional support apparatus as above. Electronically Signed   By: Julian Hy M.D.   On: 10/14/2020 06:18   DG Chest Port 1 View  Result Date: 10/14/2020 CLINICAL DATA:  Acute respiratory failure EXAM: PORTABLE CHEST 1 VIEW COMPARISON:  Yesterday FINDINGS: Endotracheal tube with tip 7 mm above the carina. The enteric tube tip is at the distal stomach or proximal duodenum. Right port with tip at the upper cavoatrial junction. Moderate left pneumothorax which could be  up to 50%. There is superimposed pulmonary and pleural density. New chest wall emphysema. Critical Value/emergent results were called by telephone at the time of interpretation on 10/14/2020 at 4:49 am to provider Trevor Mace , who verbally acknowledged these results. IMPRESSION: 1. New moderate left pneumothorax and chest wall emphysema. 2. Worsening left pulmonary aeration with probable pleural fluid. 3. Low endotracheal tube with tip 7 mm above the carina. The enteric tube tip at least reaches the pylorus. Electronically Signed   By: Monte Fantasia M.D.   On: 10/14/2020 04:50   DG CHEST PORT 1 VIEW  Result Date: 10/02/2020 CLINICAL DATA:  Respiratory failure, history of colon cancer EXAM: PORTABLE CHEST 1 VIEW COMPARISON:  09/23/2020 at 1:45 p.m. FINDINGS: Single frontal view of the chest demonstrates endotracheal tube overlying tracheal air column, tip approximately 1.5 cm above carina. External defibrillator pads overlie the cardiac silhouette. Right chest wall port via internal jugular approach unchanged. Cardiac silhouette is stable. No airspace disease, effusion, or pneumothorax. IMPRESSION: 1. No complication after intubation. No acute intrathoracic process. Electronically Signed   By: Randa Ngo M.D.   On: 09/30/2020 22:15   DG Chest Port 1 View  Result Date: 09/21/2020 CLINICAL DATA:  Weakness.  Code stroke. EXAM: PORTABLE CHEST 1 VIEW COMPARISON:  11/28/2012 FINDINGS: Aortic atherosclerosis. There is a right chest wall port a catheter with tip in the right atrium. No pleural effusion or edema. No airspace densities. The visualized osseous structures are unremarkable. IMPRESSION: No acute cardiopulmonary abnormalities. Electronically Signed   By: Kerby Moors M.D.   On: 09/29/2020 14:19   DG Abd Portable 1V  Result Date: 10/18/2020 CLINICAL DATA:  Check gastric catheter placement EXAM: PORTABLE ABDOMEN - 1 VIEW COMPARISON:  None. FINDINGS: Gastric catheter is noted within the distal  stomach. No free air is seen. No other focal abnormality is noted. IMPRESSION: Gastric catheter within the stomach. Electronically Signed   By: Inez Catalina M.D.   On: 10/19/2020 23:35   EEG  adult  Result Date: 10/07/2020 Raenette Rover, MD     10/12/2020  7:28 PM EEG Report Indication: possible seizure activity This study was recorded in the waiting/drowsy state.  The duration of the study was 24 minutes.  Electrodes were placed according to the International 10/20 system.  Video was reviewed/available for clinical correlation as needed. In the waking state, discernible but diminished background organization is seen with a severely attenuated anterior - posterior voltage and frequency gradient.  In the occipital leads there was a symmetric and reactive although indistinct and poorly sustained posterior dominant rhythm of approximately 6 hertz, which is slower than expected for age  Anteriorly, is the expected pattern of faster frequency, lower voltage waveforms, although in a reduced amount as compared to normal. In addition, there was diffusely seen intermixed low - moderate amplitude Delta and theta slowing with overlying faster frequencies. During the drowsy state, there is further attenuation of the gradient, a general shift to slower frequencies diffusely, a waxing and waning of the posterior dominant rhythm, and slow roving eye movements. Hyperventilation: deferred Photic stimulation: deferred There are no clear focal, paroxysmal or epileptiform abnormalities  or interhemispheric asymmetries. Impression: This is an abnormal waking and drowsy study due to markedly diminished background organization as described above, most clinically compatible with a mild-moderate diffuse encephalopathy.  There are no clear focal or epileptiform abnormalities.   CT HEAD CODE STROKE WO CONTRAST  Result Date: 09/29/2020 CLINICAL DATA:  Code stroke. Neuro deficit, acute, stroke suspected. Additional history provided:  Confusion, last known well 10 p.m. EXAM: CT HEAD WITHOUT CONTRAST TECHNIQUE: Contiguous axial images were obtained from the base of the skull through the vertex without intravenous contrast. COMPARISON:  No pertinent prior exams available for comparison. FINDINGS: Brain: Mild cerebral and cerebellar atrophy. Small chronic appearing cortical infarct within the medial left occipital lobe (for instance as seen on series 3, image 12). Mild patchy and ill-defined hypoattenuation within the cerebral white matter is nonspecific, but compatible chronic small vessel ischemic disease. There is no acute intracranial hemorrhage. No demarcated cortical infarct. No extra-axial fluid collection. No evidence of intracranial mass. No midline shift. Vascular: No hyperdense vessel.  Atherosclerotic calcifications. Skull: Normal. Negative for fracture or focal lesion. Sinuses/Orbits: Visualized orbits show no acute finding. No significant paranasal sinus disease. ASPECTS ([place] Stroke Program Early CT Score) - Ganglionic level infarction (caudate, lentiform nuclei, internal capsule, insula, M1-M3 cortex): 7 - Supraganglionic infarction (M4-M6 cortex): 3 Total score (0-10 with 10 being normal): 10 These results were called by telephone at the time of interpretation on 10/07/2020 at 1:47 pm to provider Dr, Leonel Ramsay, who verbally acknowledged these results. IMPRESSION: No evidence of acute intracranial abnormality.  ASPECTS is 10. 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 Mild generalized parenchymal atrophy and cerebral white matter chronic small vessel ischemic disease. Electronically Signed   By: Kellie Simmering DO   On: 09/26/2020 13:51   IR IMAGING GUIDED PORT INSERTION  Result Date: 10/04/2020 INDICATION: History of colon cancer. In need of durable intravenous access for chemotherapy administration. EXAM: IMPLANTED PORT A CATH PLACEMENT WITH ULTRASOUND AND FLUOROSCOPIC GUIDANCE COMPARISON:  None.  MEDICATIONS: None ANESTHESIA/SEDATION: Moderate (conscious) sedation was employed during this procedure. A total of Versed 1.5 mg and Fentanyl 50 mcg was administered intravenously. Moderate Sedation Time: 25 minutes. The patient's level of consciousness and vital signs were monitored continuously by radiology nursing throughout the procedure under my direct supervision. CONTRAST:  None FLUOROSCOPY TIME:  24 seconds (9 mGy)  COMPLICATIONS: None immediate. PROCEDURE: The procedure, risks, benefits, and alternatives were explained to the patient. Questions regarding the procedure were encouraged and answered. The patient understands and consents to the procedure. The right neck and chest were prepped with chlorhexidine in a sterile fashion, and a sterile drape was applied covering the operative field. Maximum barrier sterile technique with sterile gowns and gloves were used for the procedure. A timeout was performed prior to the initiation of the procedure. Local anesthesia was provided with 1% lidocaine with epinephrine. After creating a small venotomy incision, a micropuncture kit was utilized to access the internal jugular vein. Real-time ultrasound guidance was utilized for vascular access including the acquisition of a permanent ultrasound image documenting patency of the accessed vessel. The microwire was utilized to measure appropriate catheter length. A subcutaneous port pocket was then created along the upper chest wall utilizing a combination of sharp and blunt dissection. The pocket was irrigated with sterile saline. A single lumen "Slim" sized power injectable port was chosen for placement. The 8 Fr catheter was tunneled from the port pocket site to the venotomy incision. The port was placed in the pocket. The external catheter was trimmed to appropriate length. At the venotomy, an 8 Fr peel-away sheath was placed over a guidewire under fluoroscopic guidance. The catheter was then placed through the sheath  and the sheath was removed. Final catheter positioning was confirmed and documented with a fluoroscopic spot radiograph. The port was accessed with a Huber needle, aspirated and flushed with heparinized saline. The venotomy site was closed with an interrupted 4-0 Vicryl suture. The port pocket incision was closed with interrupted 2-0 Vicryl suture. The skin was opposed with a running subcuticular 4-0 Vicryl suture. Dermabond and Steri-strips were applied to both incisions. Dressings were applied. The patient tolerated the procedure well without immediate post procedural complication. FINDINGS: After catheter placement, the tip lies within the superior cavoatrial junction. The catheter aspirates and flushes normally and is ready for immediate use. IMPRESSION: Successful placement of a right internal jugular approach power injectable Port-A-Cath. The catheter is ready for immediate use. Electronically Signed   By: Sandi Mariscal M.D.   On: 10/04/2020 14:08   VAS US CAROTID  Result Date: 10/14/2020 Carotid Arterial Duplex Study Indications:       CVA. Risk Factors:      Hypertension, hyperlipidemia, Diabetes, coronary artery                    disease. Limitations        Today's exam was limited due to patient on a ventilator and                    patient positioning. Comparison Study:  07/13/18 previous Performing Technologist: Abram Sander RVS  Examination Guidelines: A complete evaluation includes B-mode imaging, spectral Doppler, color Doppler, and power Doppler as needed of all accessible portions of each vessel. Bilateral testing is considered an integral part of a complete examination. Limited examinations for reoccurring indications may be performed as noted.  Right Carotid Findings: +----------+--------+--------+--------+------------------+---------------------+           PSV cm/sEDV cm/sStenosisPlaque DescriptionComments               +----------+--------+--------+--------+------------------+---------------------+ CCA Prox  111     36              heterogenous                            +----------+--------+--------+--------+------------------+---------------------+  CCA Distal68      23              heterogenous                            +----------+--------+--------+--------+------------------+---------------------+ ICA Prox  67      27      1-39%   heterogenous      limited visualization                                                     due to patient                                                            positioning           +----------+--------+--------+--------+------------------+---------------------+ ICA Mid                                             Not visualized        +----------+--------+--------+--------+------------------+---------------------+ ECA       88      14                                                      +----------+--------+--------+--------+------------------+---------------------+ +----------+--------+-------+--------+-------------------+           PSV cm/sEDV cmsDescribeArm Pressure (mmHG) +----------+--------+-------+--------+-------------------+ [id number]                                         +----------+--------+-------+--------+-------------------+ +---------+--------+--------+--------------+ VertebralPSV cm/sEDV cm/sNot identified +---------+--------+--------+--------------+  Left Carotid Findings: +----------+--------+--------+--------+------------------+--------+           PSV cm/sEDV cm/sStenosisPlaque DescriptionComments +----------+--------+--------+--------+------------------+--------+ CCA Prox  103     33              heterogenous               +----------+--------+--------+--------+------------------+--------+ CCA Distal82      33              heterogenous                +----------+--------+--------+--------+------------------+--------+ ICA Prox  84      32      1-39%   heterogenous               +----------+--------+--------+--------+------------------+--------+ ICA Distal95      43                                         +----------+--------+--------+--------+------------------+--------+ ECA       71                                                 +----------+--------+--------+--------+------------------+--------+ +----------+--------+--------+--------+-------------------+  PSV cm/sEDV cm/sDescribeArm Pressure (mmHG) +----------+--------+--------+--------+-------------------+ [id number]                                          +----------+--------+--------+--------+-------------------+ +---------+--------+--+--------+--+---------+ VertebralPSV cm/s78EDV cm/s36Antegrade +---------+--------+--+--------+--+---------+   Summary: Right Carotid: Velocities in the right ICA are consistent with a 1-39% stenosis. Left Carotid: Velocities in the left ICA are consistent with a 1-39% stenosis. Vertebrals: Bilateral vertebral arteries demonstrate antegrade flow. *See table(s) above for measurements and observations.     Preliminary    ECHOCARDIOGRAM LIMITED  Result Date: 10/14/2020    ECHOCARDIOGRAM REPORT   Patient Name:   MABELINE VARAS Date of Exam: 10/14/2020 Medical Rec #:  [id number]     Height:       67.0 in Accession #:    [id number]    Weight:       195.3 lb Date of Birth:  1951/02/08     BSA:          2.002 m Patient Age:    42 years      BP:           120/70 mmHg Patient Gender: F             HR:           81 bpm. Exam Location:  Inpatient Procedure: Limited Echo, Color Doppler and Cardiac Doppler Indications:    stroke  History:        Patient has prior history of Echocardiogram examinations, most                 recent 03/02/2018. Cancer. end stage renal disease.,                 Arrythmias:Ventricular Fibrillation; Risk  Factors:Hypertension.  Sonographer:    Johny Chess Referring Phys: [id number] [facility]  Sonographer Comments: Technically difficult study due to poor echo windows, Technically challenging study due to limited acoustic windows, suboptimal parasternal window and echo performed with patient supine and on artificial respirator. Image acquisition challenging due to patient body habitus and Image acquisition challenging due to respiratory motion. IMPRESSIONS  1. Extremely limited; LV function ? preserved; no pericardial effusion; no other information could be obtained with this study; suggest TEE if clinically indicated.  2. Left ventricular ejection fraction, by estimation, is Cannot be assessed%. The left ventricle has Cannot be assessed function. Left ventricular endocardial border not optimally defined to evaluate regional wall motion. The left ventricular internal cavity size was Not assessed. There is Not assessed left ventricular hypertrophy. Left ventricular diastolic function could not be evaluated.  3. Right ventricular systolic function is normal. The right ventricular size is normal.  4. The mitral valve was not assessed. Not fully assessed mitral valve regurgitation.  5. Tricuspid valve regurgitation Not fully assessed.  6. The aortic valve was not assessed. Aortic valve regurgitation Not assessed.  7. Pulmonic valve regurgitation Not assessed. FINDINGS  Left Ventricle: Left ventricular ejection fraction, by estimation, is Cannot be assessed%. The left ventricle has Cannot be assessed function. Left ventricular endocardial border not optimally defined to evaluate regional wall motion. The left ventricular internal cavity size was Not assessed. There is Not assessed left ventricular hypertrophy. Left ventricular diastolic function could not be evaluated. Right Ventricle: The right ventricular size is normal. Right vetricular wall thickness was not assessed. Right ventricular systolic function is normal.  Left Atrium: Left atrial size  was not assessed. Right Atrium: Right atrial size was not assessed. Pericardium: There is no evidence of pericardial effusion. Mitral Valve: The mitral valve was not assessed. Not fully assessed mitral valve regurgitation. Tricuspid Valve: The tricuspid valve is not assessed. Tricuspid valve regurgitation Not fully assessed. No evidence of tricuspid stenosis. Aortic Valve: The aortic valve was not assessed. Aortic valve regurgitation Not assessed. Pulmonic Valve: The pulmonic valve was not assessed. Pulmonic valve regurgitation Not assessed. Aorta: Aortic root could not be assessed. Venous: The inferior vena cava was not well visualized.  Additional Comments: Extremely limited; LV function ? preserved; no pericardial effusion; no other information could be obtained with this study; suggest TEE if clinically indicated.  MV E velocity: 54.40 cm/s MV A velocity: 83.10 cm/s MV E/A ratio:  0.65 Kirk Ruths MD Electronically signed by Kirk Ruths MD Signature Date/Time: 10/14/2020/2:08:03 PM    Final    CT ANGIO HEAD NECK W WO CM W PERFUSION  Addendum Date: 10/06/2020   ADDENDUM REPORT: 09/23/2020 16:01 ADDENDUM: The CT perfusion was performed. However, the CT perfusion was stopped due to patient motion and restarted. The quality of the data is suspect. Based on rapid analysis, no evidence of acute infarct or ischemia. Electronically Signed   By: Franchot Gallo M.D.   On: 10/03/2020 16:01   Result Date: 09/22/2020 CLINICAL DATA:  Focal neuro deficit greater than 6 hours. Suspect stroke. Confusion EXAM: CT ANGIOGRAPHY HEAD AND NECK CT PERFUSION HEAD TECHNIQUE: Multidetector CT imaging of the head and neck was performed using the standard protocol during bolus administration of intravenous contrast. Multiplanar CT image reconstructions and MIPs were obtained to evaluate the vascular anatomy. Carotid stenosis measurements (when applicable) are obtained utilizing NASCET criteria, using  the distal internal carotid diameter as the denominator. CONTRAST:  125m OMNIPAQUE IOHEXOL 350 MG/ML SOLN COMPARISON:  CT head 10/08/2020 FINDINGS: CT HEAD FINDINGS Following the CT angiogram: Delayed imaging of the brain obtained. No enhancing lesion identified. Patchy white matter hypodensity again noted. 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 CTA NECK FINDINGS Aortic arch: Standard branching. Imaged portion shows no evidence of aneurysm or dissection. No significant stenosis of the major arch vessel origins. Atherosclerotic calcification aortic arch and proximal great vessels. Right carotid system: Atherosclerotic calcification in the right common carotid artery without stenosis. Atherosclerotic calcification right carotid bifurcation without significant stenosis. Left carotid system: Atherosclerotic calcification left common carotid artery without stenosis. Atherosclerotic calcification left carotid bifurcation. Approximately 25% diameter stenosis left internal carotid artery. Vertebral arteries: Moderate calcific stenosis origin of right vertebral artery without additional stenosis and patent to the basilar. Mild calcific stenosis origin of left vertebral artery without additional stenosis. Skeleton: Disc and facet degeneration throughout the cervical and thoracic spine. No acute skeletal abnormality. Other neck: Subcentimeter thyroid nodules bilaterally. No further imaging necessary. Upper chest: Lung apices clear bilaterally. 22 mm right lower paratracheal lymph node. Review of the MIP images confirms the above findings CTA HEAD FINDINGS Anterior circulation: Atherosclerotic calcification without stenosis in the cavernous carotid bilaterally. Anterior middle cerebral arteries widely patent without large vessel occlusion. Posterior circulation: Posterior circulation widely patent without stenosis or large vessel occlusion. Venous sinuses: Normal venous enhancement. Anatomic  variants: None Review of the MIP images confirms the above findings IMPRESSION: 1. Postcontrast imaging of the brain reveals no enhancing mass lesion. No acute infarct. 2. Atherosclerotic calcification aortic arch and proximal great vessels and carotid artery bilaterally. 3. Atherosclerotic calcification right carotid bifurcation without significant stenosis. Less  than 25% diameter stenosis proximal left internal carotid artery 4. Atherosclerotic calcification and stenosis at the origin of the vertebral artery bilaterally right greater than left. 5. No intracranial large vessel occlusion 6. 22 mm right lower paratracheal lymph node. 7. CT perfusion images pending at this time. Electronically Signed: By: Marlan Palau M.D. On: 09/22/2020 15:18     DG Chest 1 View  Result Date: 10/15/2020 CLINICAL DATA:  Central line placement EXAM: CHEST  1 VIEW COMPARISON:  Portable exam 1057 hours compared to 0516 hours FINDINGS: Tip of endotracheal tube projects 3.3 cm above carina. Nasogastric tube extends into abdomen. Two LEFT thoracostomy tubes present, pigtail new since prior study. RIGHT jugular Port-A-Cath with tip projecting over cavoatrial junction. New LEFT jugular central venous catheter with tip projecting over SVC. Tips of lung apices excluded. Normal heart size and mediastinal contours. Extensive chest wall pneumatosis projects over thorax bilaterally. Linear gas collection at medial inferior LEFT hemithorax likely medial LEFT pneumothorax. LEFT pneumothorax is significantly decreased following second chest tube insertion. Central peribronchial thickening and bibasilar atelectasis versus infiltrate unchanged. IMPRESSION: Decrease in LEFT pneumothorax following second thoracostomy tube insertion. New LEFT jugular line with tip projecting over SVC. Persistent bronchitic changes and bibasilar atelectasis versus consolidation. Extensive chest wall emphysema. Electronically Signed   By: Ulyses Southward M.D.   On:  10/15/2020 11:16   MR BRAIN WO CONTRAST  Result Date: 10/14/2020 CLINICAL DATA:  TIA EXAM: MRI HEAD WITHOUT CONTRAST TECHNIQUE: Multiplanar, multiecho pulse sequences of the brain and surrounding structures were obtained without intravenous contrast. COMPARISON:  None. FINDINGS: Brain: Cortical/subcortical reduced diffusion in the left parietal and occipital lobes. No evidence of hemorrhage. There is no intracranial mass or significant mass effect. Patchy foci of T2 hyperintensity in the supratentorial and pontine white matter are nonspecific may reflect mild to moderate chronic microvascular ischemic changes. Prominence of the ventricles and sulci reflects generalized parenchymal volume loss. Chronic parasagittal left occipital infarct. Small chronic right middle frontal gyrus infarct. There is no hydrocephalus or extra-axial fluid collection. Vascular: Major vessel flow voids at the skull base are preserved. Skull and upper cervical spine: Normal marrow signal is preserved. Sinuses/Orbits: Minor mucosal thickening.  Orbits are unremarkable. Other: Sella is unremarkable.  Mastoid air cells are clear. IMPRESSION: Acute cortical/subcortical infarcts of the left parietal and occipital lobes. No hemorrhage. Small chronic left occipital and right frontal infarcts. Chronic microvascular ischemic changes. Electronically Signed   By: Guadlupe Spanish M.D.   On: 10/14/2020 13:55   DG CHEST PORT 1 VIEW  Result Date: 10/16/2020 CLINICAL DATA:  Acute respiratory failure, hypoxia EXAM: PORTABLE CHEST 1 VIEW COMPARISON:  10/15/2020 FINDINGS: Endotracheal tube is seen 2.4 cm above the carina. Nasogastric tube extends into the upper abdomen. Left internal jugular hemodialysis catheter tip is unchanged when accounting for changes in patient positioning with its tip overlying the brachiocephalic vein. Right internal jugular chest port tip noted within the right atrium. Left apical pigtail chest tube is unchanged. Additional  left mid lung zone large bore chest tube is again noted. There is increasing lucency involving the left hemithorax which may relate to increasing left basilar collapse and compensatory hyperinflation of the left upper lobe. An anterior, loculated pneumothorax could result in a similar appearance, but is considered less likely. No definite pneumothorax is identified. There is increasing bibasilar atelectasis or infiltrate. Cardiac size within normal limits. Extensive gas within the left chest wall again identified. IMPRESSION: Stable support lines and tubes. Increasing lucency of the left hemithorax  likely related to hyperinflation of the left upper lobe in the setting of increasing left basilar atelectasis or infiltrate. While and anteriorly loculated pneumothorax could result in a similar appearance, this is considered less likely. Progressive bibasilar atelectasis or infiltrate. Electronically Signed   By: Fidela Salisbury MD   On: 10/16/2020 07:16   DG CHEST PORT 1 VIEW  Result Date: 10/15/2020 CLINICAL DATA:  Acute respiratory failure with hypoxia EXAM: PORTABLE CHEST 1 VIEW COMPARISON:  10/14/2020 FINDINGS: Indwelling left chest tube. Moderate left pneumothorax, progressive. Extensive subcutaneous emphysema predominantly over the left chest wall, progressive. Patchy bilateral lower lobe opacities, likely atelectasis. No pleural effusions. Endotracheal tube terminates 4.8 cm above the carina. Right chest power port terminates the cavoatrial junction. Enteric tube courses into the stomach. The heart is normal in size. IMPRESSION: Moderate left pneumothorax, progressive. Indwelling left chest tube. Extensive subcutaneous emphysema, progressive. These results will be called to the ordering clinician or representative by the Radiologist Assistant, and communication documented in the PACS or [employer] [employer]. Electronically Signed   By: Julian Hy M.D.   On: 10/15/2020 05:43   DG CHEST PORT 1  VIEW  Result Date: 10/14/2020 CLINICAL DATA:  Chest tube placement EXAM: PORTABLE CHEST 1 VIEW COMPARISON:  10/14/2020 at 0417 hours FINDINGS: Indwelling left chest tube. A persistent trace left pneumothorax may be obscured by the overlying defibrillator pad. Right lung is essentially clear.  No definite pleural effusion. Endotracheal tube terminates 10 mm above the carina. Enteric tube courses into the proximal stomach. Right chest power port terminates at the cavoatrial junction. Mild cardiomegaly. IMPRESSION: Indwelling left chest tube. A persistent trace left pneumothorax may be obscured by the overlying defibrillator pad. Endotracheal tube terminates 10 mm above the carina. Additional support apparatus as above. Electronically Signed   By: Julian Hy M.D.   On: 10/14/2020 06:18   DG Chest Port 1 View  Result Date: 10/14/2020 CLINICAL DATA:  Acute respiratory failure EXAM: PORTABLE CHEST 1 VIEW COMPARISON:  Yesterday FINDINGS: Endotracheal tube with tip 7 mm above the carina. The enteric tube tip is at the distal stomach or proximal duodenum. Right port with tip at the upper cavoatrial junction. Moderate left pneumothorax which could be up to 50%. There is superimposed pulmonary and pleural density. New chest wall emphysema. Critical Value/emergent results were called by telephone at the time of interpretation on 10/14/2020 at 4:49 am to provider Trevor Mace , who verbally acknowledged these results. IMPRESSION: 1. New moderate left pneumothorax and chest wall emphysema. 2. Worsening left pulmonary aeration with probable pleural fluid. 3. Low endotracheal tube with tip 7 mm above the carina. The enteric tube tip at least reaches the pylorus. Electronically Signed   By: Monte Fantasia M.D.   On: 10/14/2020 04:50   DG CHEST PORT 1 VIEW  Result Date: 10/05/2020 CLINICAL DATA:  Respiratory failure, history of colon cancer EXAM: PORTABLE CHEST 1 VIEW COMPARISON:  09/28/2020 at 1:45 p.m. FINDINGS:  Single frontal view of the chest demonstrates endotracheal tube overlying tracheal air column, tip approximately 1.5 cm above carina. External defibrillator pads overlie the cardiac silhouette. Right chest wall port via internal jugular approach unchanged. Cardiac silhouette is stable. No airspace disease, effusion, or pneumothorax. IMPRESSION: 1. No complication after intubation. No acute intrathoracic process. Electronically Signed   By: Randa Ngo M.D.   On: 10/05/2020 22:15   DG Chest Port 1 View  Result Date: 10/08/2020 CLINICAL DATA:  Weakness.  Code stroke. EXAM: PORTABLE CHEST 1 VIEW COMPARISON:  11/28/2012 FINDINGS: Aortic atherosclerosis. There is a right chest wall port a catheter with tip in the right atrium. No pleural effusion or edema. No airspace densities. The visualized osseous structures are unremarkable. IMPRESSION: No acute cardiopulmonary abnormalities. Electronically Signed   By: Kerby Moors M.D.   On: 09/21/2020 14:19   DG Abd Portable 1V  Result Date: 10/10/2020 CLINICAL DATA:  Check gastric catheter placement EXAM: PORTABLE ABDOMEN - 1 VIEW COMPARISON:  None. FINDINGS: Gastric catheter is noted within the distal stomach. No free air is seen. No other focal abnormality is noted. IMPRESSION: Gastric catheter within the stomach. Electronically Signed   By: Inez Catalina M.D.   On: 10/01/2020 23:35   EEG adult  Result Date: 09/20/2020 Raenette Rover, MD     10/10/2020  7:28 PM EEG Report Indication: possible seizure activity This study was recorded in the waiting/drowsy state.  The duration of the study was 24 minutes.  Electrodes were placed according to the International 10/20 system.  Video was reviewed/available for clinical correlation as needed. In the waking state, discernible but diminished background organization is seen with a severely attenuated anterior - posterior voltage and frequency gradient.  In the occipital leads there was a symmetric and reactive  although indistinct and poorly sustained posterior dominant rhythm of approximately 6 hertz, which is slower than expected for age  Anteriorly, is the expected pattern of faster frequency, lower voltage waveforms, although in a reduced amount as compared to normal. In addition, there was diffusely seen intermixed low - moderate amplitude Delta and theta slowing with overlying faster frequencies. During the drowsy state, there is further attenuation of the gradient, a general shift to slower frequencies diffusely, a waxing and waning of the posterior dominant rhythm, and slow roving eye movements. Hyperventilation: deferred Photic stimulation: deferred There are no clear focal, paroxysmal or epileptiform abnormalities  or interhemispheric asymmetries. Impression: This is an abnormal waking and drowsy study due to markedly diminished background organization as described above, most clinically compatible with a mild-moderate diffuse encephalopathy.  There are no clear focal or epileptiform abnormalities.   CT HEAD CODE STROKE WO CONTRAST  Result Date: 10/12/2020 CLINICAL DATA:  Code stroke. Neuro deficit, acute, stroke suspected. Additional history provided: Confusion, last known well 10 p.m. EXAM: CT HEAD WITHOUT CONTRAST TECHNIQUE: Contiguous axial images were obtained from the base of the skull through the vertex without intravenous contrast. COMPARISON:  No pertinent prior exams available for comparison. FINDINGS: Brain: Mild cerebral and cerebellar atrophy. Small chronic appearing cortical infarct within the medial left occipital lobe (for instance as seen on series 3, image 12). Mild patchy and ill-defined hypoattenuation within the cerebral white matter is nonspecific, but compatible chronic small vessel ischemic disease. There is no acute intracranial hemorrhage. No demarcated cortical infarct. No extra-axial fluid collection. No evidence of intracranial mass. No midline shift. Vascular: No hyperdense  vessel.  Atherosclerotic calcifications. Skull: Normal. Negative for fracture or focal lesion. Sinuses/Orbits: Visualized orbits show no acute finding. No significant paranasal sinus disease. ASPECTS ([place] Stroke Program Early CT Score) - Ganglionic level infarction (caudate, lentiform nuclei, internal capsule, insula, M1-M3 cortex): 7 - Supraganglionic infarction (M4-M6 cortex): 3 Total score (0-10 with 10 being normal): 10 These results were called by telephone at the time of interpretation on 09/30/2020 at 1:47 pm to provider Dr, Leonel Ramsay, who verbally acknowledged these results. IMPRESSION: No evidence of acute intracranial abnormality.  ASPECTS is 10. 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 Mild generalized parenchymal atrophy and  cerebral white matter chronic small vessel ischemic disease. Electronically Signed   By: Kellie Simmering DO   On: 10/10/2020 13:51   IR IMAGING GUIDED PORT INSERTION  Result Date: 10/04/2020 INDICATION: History of colon cancer. In need of durable intravenous access for chemotherapy administration. EXAM: IMPLANTED PORT A CATH PLACEMENT WITH ULTRASOUND AND FLUOROSCOPIC GUIDANCE COMPARISON:  None. MEDICATIONS: None ANESTHESIA/SEDATION: Moderate (conscious) sedation was employed during this procedure. A total of Versed 1.5 mg and Fentanyl 50 mcg was administered intravenously. Moderate Sedation Time: 25 minutes. The patient's level of consciousness and vital signs were monitored continuously by radiology nursing throughout the procedure under my direct supervision. CONTRAST:  None FLUOROSCOPY TIME:  24 seconds (9 mGy) COMPLICATIONS: None immediate. PROCEDURE: The procedure, risks, benefits, and alternatives were explained to the patient. Questions regarding the procedure were encouraged and answered. The patient understands and consents to the procedure. The right neck and chest were prepped with chlorhexidine in a sterile fashion, and a sterile drape was  applied covering the operative field. Maximum barrier sterile technique with sterile gowns and gloves were used for the procedure. A timeout was performed prior to the initiation of the procedure. Local anesthesia was provided with 1% lidocaine with epinephrine. After creating a small venotomy incision, a micropuncture kit was utilized to access the internal jugular vein. Real-time ultrasound guidance was utilized for vascular access including the acquisition of a permanent ultrasound image documenting patency of the accessed vessel. The microwire was utilized to measure appropriate catheter length. A subcutaneous port pocket was then created along the upper chest wall utilizing a combination of sharp and blunt dissection. The pocket was irrigated with sterile saline. A single lumen "Slim" sized power injectable port was chosen for placement. The 8 Fr catheter was tunneled from the port pocket site to the venotomy incision. The port was placed in the pocket. The external catheter was trimmed to appropriate length. At the venotomy, an 8 Fr peel-away sheath was placed over a guidewire under fluoroscopic guidance. The catheter was then placed through the sheath and the sheath was removed. Final catheter positioning was confirmed and documented with a fluoroscopic spot radiograph. The port was accessed with a Huber needle, aspirated and flushed with heparinized saline. The venotomy site was closed with an interrupted 4-0 Vicryl suture. The port pocket incision was closed with interrupted 2-0 Vicryl suture. The skin was opposed with a running subcuticular 4-0 Vicryl suture. Dermabond and Steri-strips were applied to both incisions. Dressings were applied. The patient tolerated the procedure well without immediate post procedural complication. FINDINGS: After catheter placement, the tip lies within the superior cavoatrial junction. The catheter aspirates and flushes normally and is ready for immediate use. IMPRESSION:  Successful placement of a right internal jugular approach power injectable Port-A-Cath. The catheter is ready for immediate use. Electronically Signed   By: Sandi Mariscal M.D.   On: 10/04/2020 14:08   VAS US CAROTID  Result Date: 10/14/2020 Carotid Arterial Duplex Study Indications:       CVA. Risk Factors:      Hypertension, hyperlipidemia, Diabetes, coronary artery                    disease. Limitations        Today's exam was limited due to patient on a ventilator and                    patient positioning. Comparison Study:  07/13/18 previous Performing Technologist: Abram Sander RVS  Examination  Guidelines: A complete evaluation includes B-mode imaging, spectral Doppler, color Doppler, and power Doppler as needed of all accessible portions of each vessel. Bilateral testing is considered an integral part of a complete examination. Limited examinations for reoccurring indications may be performed as noted.  Right Carotid Findings: +----------+--------+--------+--------+------------------+---------------------+           PSV cm/sEDV cm/sStenosisPlaque DescriptionComments              +----------+--------+--------+--------+------------------+---------------------+ CCA Prox  111     36              heterogenous                            +----------+--------+--------+--------+------------------+---------------------+ CCA Distal68      23              heterogenous                            +----------+--------+--------+--------+------------------+---------------------+ ICA Prox  67      27      1-39%   heterogenous      limited visualization                                                     due to patient                                                            positioning           +----------+--------+--------+--------+------------------+---------------------+ ICA Mid                                             Not visualized         +----------+--------+--------+--------+------------------+---------------------+ ECA       88      14                                                      +----------+--------+--------+--------+------------------+---------------------+ +----------+--------+-------+--------+-------------------+           PSV cm/sEDV cmsDescribeArm Pressure (mmHG) +----------+--------+-------+--------+-------------------+ [id number]                                         +----------+--------+-------+--------+-------------------+ +---------+--------+--------+--------------+ VertebralPSV cm/sEDV cm/sNot identified +---------+--------+--------+--------------+  Left Carotid Findings: +----------+--------+--------+--------+------------------+--------+           PSV cm/sEDV cm/sStenosisPlaque DescriptionComments +----------+--------+--------+--------+------------------+--------+ CCA Prox  103     33              heterogenous               +----------+--------+--------+--------+------------------+--------+ CCA Distal82      33              heterogenous               +----------+--------+--------+--------+------------------+--------+  ICA Prox  84      32      1-39%   heterogenous               +----------+--------+--------+--------+------------------+--------+ ICA Distal95      43                                         +----------+--------+--------+--------+------------------+--------+ ECA       71                                                 +----------+--------+--------+--------+------------------+--------+ +----------+--------+--------+--------+-------------------+           PSV cm/sEDV cm/sDescribeArm Pressure (mmHG) +----------+--------+--------+--------+-------------------+ [id number]                                          +----------+--------+--------+--------+-------------------+ +---------+--------+--+--------+--+---------+ VertebralPSV  cm/s78EDV cm/s36Antegrade +---------+--------+--+--------+--+---------+   Summary: Right Carotid: Velocities in the right ICA are consistent with a 1-39% stenosis. Left Carotid: Velocities in the left ICA are consistent with a 1-39% stenosis. Vertebrals: Bilateral vertebral arteries demonstrate antegrade flow. *See table(s) above for measurements and observations.     Preliminary    ECHOCARDIOGRAM LIMITED  Result Date: 10/14/2020    ECHOCARDIOGRAM REPORT   Patient Name:   GEANETTE BUONOCORE Date of Exam: 10/14/2020 Medical Rec #:  [id number]     Height:       67.0 in Accession #:    [id number]    Weight:       195.3 lb Date of Birth:  01-01-1951     BSA:          2.002 m Patient Age:    66 years      BP:           120/70 mmHg Patient Gender: F             HR:           81 bpm. Exam Location:  Inpatient Procedure: Limited Echo, Color Doppler and Cardiac Doppler Indications:    stroke  History:        Patient has prior history of Echocardiogram examinations, most                 recent 03/02/2018. Cancer. end stage renal disease.,                 Arrythmias:Ventricular Fibrillation; Risk Factors:Hypertension.  Sonographer:    Johny Chess Referring Phys: [id number] [facility]  Sonographer Comments: Technically difficult study due to poor echo windows, Technically challenging study due to limited acoustic windows, suboptimal parasternal window and echo performed with patient supine and on artificial respirator. Image acquisition challenging due to patient body habitus and Image acquisition challenging due to respiratory motion. IMPRESSIONS  1. Extremely limited; LV function ? preserved; no pericardial effusion; no other information could be obtained with this study; suggest TEE if clinically indicated.  2. Left ventricular ejection fraction, by estimation, is Cannot be assessed%. The left ventricle has Cannot be assessed function. Left ventricular endocardial border not optimally defined to evaluate regional wall  motion. The left ventricular internal cavity size was Not assessed. There is Not assessed  left ventricular hypertrophy. Left ventricular diastolic function could not be evaluated.  3. Right ventricular systolic function is normal. The right ventricular size is normal.  4. The mitral valve was not assessed. Not fully assessed mitral valve regurgitation.  5. Tricuspid valve regurgitation Not fully assessed.  6. The aortic valve was not assessed. Aortic valve regurgitation Not assessed.  7. Pulmonic valve regurgitation Not assessed. FINDINGS  Left Ventricle: Left ventricular ejection fraction, by estimation, is Cannot be assessed%. The left ventricle has Cannot be assessed function. Left ventricular endocardial border not optimally defined to evaluate regional wall motion. The left ventricular internal cavity size was Not assessed. There is Not assessed left ventricular hypertrophy. Left ventricular diastolic function could not be evaluated. Right Ventricle: The right ventricular size is normal. Right vetricular wall thickness was not assessed. Right ventricular systolic function is normal. Left Atrium: Left atrial size was not assessed. Right Atrium: Right atrial size was not assessed. Pericardium: There is no evidence of pericardial effusion. Mitral Valve: The mitral valve was not assessed. Not fully assessed mitral valve regurgitation. Tricuspid Valve: The tricuspid valve is not assessed. Tricuspid valve regurgitation Not fully assessed. No evidence of tricuspid stenosis. Aortic Valve: The aortic valve was not assessed. Aortic valve regurgitation Not assessed. Pulmonic Valve: The pulmonic valve was not assessed. Pulmonic valve regurgitation Not assessed. Aorta: Aortic root could not be assessed. Venous: The inferior vena cava was not well visualized.  Additional Comments: Extremely limited; LV function ? preserved; no pericardial effusion; no other information could be obtained with this study; suggest TEE if  clinically indicated.  MV E velocity: 54.40 cm/s MV A velocity: 83.10 cm/s MV E/A ratio:  0.65 Kirk Ruths MD Electronically signed by Kirk Ruths MD Signature Date/Time: 10/14/2020/2:08:03 PM    Final    CT ANGIO HEAD NECK W WO CM W PERFUSION  Addendum Date: 10/01/2020   ADDENDUM REPORT: 10/04/2020 16:01 ADDENDUM: The CT perfusion was performed. However, the CT perfusion was stopped due to patient motion and restarted. The quality of the data is suspect. Based on rapid analysis, no evidence of acute infarct or ischemia. Electronically Signed   By: Franchot Gallo M.D.   On: 10/16/2020 16:01   Result Date: 10/15/2020 CLINICAL DATA:  Focal neuro deficit greater than 6 hours. Suspect stroke. Confusion EXAM: CT ANGIOGRAPHY HEAD AND NECK CT PERFUSION HEAD TECHNIQUE: Multidetector CT imaging of the head and neck was performed using the standard protocol during bolus administration of intravenous contrast. Multiplanar CT image reconstructions and MIPs were obtained to evaluate the vascular anatomy. Carotid stenosis measurements (when applicable) are obtained utilizing NASCET criteria, using the distal internal carotid diameter as the denominator. CONTRAST:  126m OMNIPAQUE IOHEXOL 350 MG/ML SOLN COMPARISON:  CT head 10/18/2020 FINDINGS: CT HEAD FINDINGS Following the CT angiogram: Delayed imaging of the brain obtained. No enhancing lesion identified. Patchy white matter hypodensity again noted. 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 CTA NECK FINDINGS Aortic arch: Standard branching. Imaged portion shows no evidence of aneurysm or dissection. No significant stenosis of the major arch vessel origins. Atherosclerotic calcification aortic arch and proximal great vessels. Right carotid system: Atherosclerotic calcification in the right common carotid artery without stenosis. Atherosclerotic calcification right carotid bifurcation without significant stenosis. Left carotid system:  Atherosclerotic calcification left common carotid artery without stenosis. Atherosclerotic calcification left carotid bifurcation. Approximately 25% diameter stenosis left internal carotid artery. Vertebral arteries: Moderate calcific stenosis origin of right vertebral artery without additional stenosis and patent to  the basilar. Mild calcific stenosis origin of left vertebral artery without additional stenosis. Skeleton: Disc and facet degeneration throughout the cervical and thoracic spine. No acute skeletal abnormality. Other neck: Subcentimeter thyroid nodules bilaterally. No further imaging necessary. Upper chest: Lung apices clear bilaterally. 22 mm right lower paratracheal lymph node. Review of the MIP images confirms the above findings CTA HEAD FINDINGS Anterior circulation: Atherosclerotic calcification without stenosis in the cavernous carotid bilaterally. Anterior middle cerebral arteries widely patent without large vessel occlusion. Posterior circulation: Posterior circulation widely patent without stenosis or large vessel occlusion. Venous sinuses: Normal venous enhancement. Anatomic variants: None Review of the MIP images confirms the above findings IMPRESSION: 1. Postcontrast imaging of the brain reveals no enhancing mass lesion. No acute infarct. 2. Atherosclerotic calcification aortic arch and proximal great vessels and carotid artery bilaterally. 3. Atherosclerotic calcification right carotid bifurcation without significant stenosis. Less than 25% diameter stenosis proximal left internal carotid artery 4. Atherosclerotic calcification and stenosis at the origin of the vertebral artery bilaterally right greater than left. 5. No intracranial large vessel occlusion 6. 22 mm right lower paratracheal lymph node. 7. CT perfusion images pending at this time. Electronically Signed: By: Franchot Gallo M.D. On: 10/16/2020 15:18   Assessment and Plan:  JALIA ZUNIGA 70 y.o. female with medical history  significant for metastatic colon cancer who recently started systemic treatment with FOLFOX plus bevacizumab as palliative chemotherapy  on 10/12/2020.  She had a 30% dose reduction in the oxaliplatin and no dose reduction in the 5-FU.   The patient was admitted to the hospital secondary to altered mental status and subsequently experienced V. fib arrest and ventricular tachycardia thought to be due to prolonged QTC with subsequent respiratory failure requiring mechanical ventilation.  # Metastatic Colon Cancer (Pathology staging pT4a, pN2a,  PM1c)  --Findings are most consistent with a metastatic colon cancer with metastasis to the peritoneal surface.   --Unfortunately the nature of this disease is incurable and the goal of chemotherapy is palliative. --Patient started cycle #1 of FOLFOX with bevacizumab on 10/12/2020. --No additional chemotherapy is planned given acute events.  # V. fib cardiac arrest, possibly from prolonged QTC --Unclear if related to recent chemotherapy or underlying CAD with ventricular arrhythmia from metabolic derangements --Cardiology following and ischemic work-up not planned.  # Acute hypoxemic respiratory failure secondary to cardiac arrest --On mechanical ventilation --On fentanyl drip  # Goals of care --The patient's underlying malignancy is incurable the intent of her chemotherapy was palliative. --There are ongoing goals of care discussion per PCCM with family who appear to be leaning towards transition to comfort measures. --The patient is DNR/DNI.  Mikey Bussing, DNP, AGPCNP-BC, AOCNP

## 2020-10-16 NOTE — Progress Notes (Signed)
Pharmacy Antibiotic Note  [name] is a 70 y.o. female admitted on 10/02/2020 with VT, ischemic strokes. Pt is an ESRD pt and is currently on pressors, requiring CRRT. Concern for sepsis with patient afebrile and WBC trending down while on vanc/cefepime.   Plan: CONTINUE cefepime 2 g q12h STOP vanc 750 mg q24h  START vanc 1000 mg Q24H  Monitor cx, LOT, lvls prn Adjust above when off CRRT  Height: 5\' 7"  (170.2 cm) Weight: 92.8 kg (204 lb 9.4 oz) IBW/kg (Calculated) : 61.6  Temp (24hrs), Avg:97.3 F (36.3 C), Min:96.3 F (35.7 C), Max:97.8 F (36.6 C)  Recent Labs  Lab 09/24/2020 1347 09/23/2020 1403 10/08/2020 2153 10/14/20 0230 10/14/20 0400 10/15/20 0325 10/15/20 0559 10/15/20 1155 10/15/20 1415 10/16/20 0410  WBC 7.7  --  12.1*  --  15.2* 12.6*  --   --   --  7.5  CREATININE 4.84*   < > 5.26*  --  5.82* 6.62*  --  7.39* 6.80* 4.17*  LATICACIDVEN  --   --  9.5* 3.1*  --   --  2.4*  --   --   --    < > = values in this interval not displayed.    Estimated Creatinine Clearance: 14.7 mL/min (A) (by C-G formula based on SCr of 4.17 mg/dL (H)).    Allergies  Allergen Reactions  . Latex Itching  . Penicillins Other (See Comments)    UNSPECIFIED CHILDHOOD REACTION  Has patient had a PCN reaction causing immediate rash, facial/tongue/throat swelling, SOB or lightheadedness with hypotension: Unknown Has patient had a PCN reaction causing severe rash involving mucus membranes or skin necrosis: Unknown Has patient had a PCN reaction that required hospitalization: Unknown Has patient had a PCN reaction occurring within the last 10 years: Unknown If all of the above answers are "NO", then may proceed with Cephalosporin use.   . Adhesive [Tape] Rash   Adria Dill, PharMD-Candidate  10/16/2020 1:16 PM

## 2020-10-16 NOTE — Progress Notes (Addendum)
Neurology Progress Note  Patient ID: [name] is a 70 y.o. with PMHx of  has a past medical history of Anemia, Anemia in chronic kidney disease (09/29/2018), Arthritis, Asthma, Blood transfusion without reported diagnosis, Cataract, Chronic kidney disease, Coronary artery calcification seen on CAT scan (02/17/2018), Depression, Diabetic retinopathy of both eyes ([place]), Diabetic ulcer of right foot associated with type 2 diabetes mellitus ([place]), ESRD (end stage renal disease) on dialysis [facility]), Essential hypertension (02/17/2018), HLD (hyperlipidemia), Hypertension, Left ventricular dysfunction (04/07/2018), Microalbuminuria due to type 2 diabetes mellitus ([place]), Nonsustained ventricular tachycardia ([place]) (08/28/2018), Pneumonia, Renal cell cancer ([place]), Right renal mass (03/10/2017), Ulcer of other part of foot (06/22/2013), and 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  Initially consulted for: general confusion and disorientation   [name] is an 70 y.o. 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 She was noted to be slightly confused when she first woke up Friday morning, with progressive worsening after she laid down for a nap; for example, she was pulling off her colostomy bag not knowing how to manage it at all which she was able to do before. On presentation to the [facility] ED, she was globally confused and disoriented. She was last seen being completely normally around 10 PM on Thursday prior to admission.  CT head was negative. CTA showed no LVO. DDx for her progressive confusion per  Teleneurology was acute ischemic stroke, medication effect from recent chemotherapy, postictal confusion due to unwitnessed seizure and PRES secondary to Decadron and bevacizumab. MRI  Brain showed acute cortical/subcorical infarcts at left parietal and occipital lobes without hemorrhage.   Major interval events:  VF cardiac arrest 3/25 in hospital. Intubated for respiratory failure.  .  Subjective:  Patient remains ntubated/sedated for respiratory failure. She remains on vasopressin and phenylephrine drip for dynamic stability.  No family member at the bedside..    Exam: Vitals:   10/16/20 1415 10/16/20 1430  BP:    Pulse: 77 75  Resp: (!) 22 (!) 22  Temp:    SpO2: 98% 100%   Gen: In bed, comfortable /intubated Resp: intubated, non-labored breathing, no grossly audible wheezing Cardiac: Perfusing extremities well  Abd: soft, nt  Neuro: PHYSICAL EXAM General: Awake, intubated, resting in bed in NAD HEENT-normocephalic, atraumatic Lungs: Clear to auscultation bilaterally, no wheezing, rhonchi or rales  Cardiovascular: Normal S1, S2; RRR; no murmurs, rubs, or gallops Abdomen: Nondistended    Neurological exam  Neurological Examination; done off fentanyl. Mental Status: Intubated and sedated on fentanyl, eyes open but will not follow commands. Cranial Nerves: II: PERRL.  Left gaze preference blinks to threat on the left III,IV, VI: No ptosis.   Eyes are conjugate. No forced gaze deviation.   VII: Unable to assess for smnormal bilateralile due to intubation.  VIII: hearing intact to voice IX,X: Intubated XI: Head is midline XII: Unable to assess  Motor:  Cannot lift arms at deltoids. Tone decreased bilaterally.  BLE: Will move spontaneously bilaterally without asymmetry. Tone mildly decreased.  Sensory: Reacts to touch x 4   Plantars: Equivocal bilaterally  Cerebellar: Unable to assess due to weakness and AMS Gait: Unable to assess  Pertinent Labs:  Results for  [name]" (MRN [id number]) as of 10/15/2020 09:54  Ref. Range 10/15/2020 03:25  Sodium Latest Ref Range: 135 - 145 mmol/L 134 (L)  Potassium Latest Ref Range: 3.5 - 5.1 mmol/L 5.4 (H)  Chloride Latest Ref Range: 98 - 111 mmol/L 96 (L)  CO2 Latest Ref Range: 22 - 32 mmol/L 22  Glucose Latest Ref Range: 70 - 99 mg/dL 324 (H)  BUN Latest Ref Range: 8 - 23 mg/dL 37 (H)  Creatinine Latest Ref Range: 0.44 - 1.00 mg/dL 6.62 (H)  Calcium Latest Ref Range: 8.9 - 10.3 mg/dL 8.1 (L)  Anion gap Latest Ref Range: 5 - 15  16 (H)  Phosphorus Latest Ref Range: 2.5 - 4.6 mg/dL 8.4 (H)  Magnesium Latest Ref Range: 1.7 - 2.4 mg/dL 1.9  GFR, Estimated Latest Ref Range: >60 mL/min 6 (L)  WBC Latest Ref Range: 4.0 - 10.5 K/uL 12.6 (H)  RBC Latest Ref Range: 3.87 - 5.11 MIL/uL 3.03 (L)  Hemoglobin Latest Ref Range: 12.0 - 15.0 g/dL 9.9 (L)  HCT Latest Ref Range: 36.0 - 46.0 % 30.0 (L)  MCV Latest Ref Range: 80.0 - 100.0 fL 99.0  MCH Latest Ref Range: 26.0 - 34.0 pg 32.7  MCHC Latest Ref Range: 30.0 - 36.0 g/dL 33.0  RDW Latest Ref Range: 11.5 - 15.5 % 16.0 (H)  Platelets Latest Ref Range: 150 - 400 K/uL 173   Results for [name]" (MRN [id number]) as of 10/15/2020 09:54  Ref. Range 10/15/2020 13:47  Hemoglobin A1C Latest Ref Range: 4.8 - 5.6 % 6.2 (H)  Results for [name]" (MRN [id number]) as of 10/15/2020 09:54  Ref. Range 10/14/2020 04:00  Total CHOL/HDL Ratio Latest Units: RATIO 3.4  Cholesterol Latest Ref Range: 0 - 200 mg/dL 134  HDL Cholesterol Latest Ref Range: >40 mg/dL 39 (L)  LDL (calc) Latest Ref Range: 0 - 99 mg/dL 71  Triglycerides Latest Ref Range: <150 mg/dL 121  VLDL Latest Ref Range: 0 - 40 mg/dL 24   MR brain without contrast 10/14/20 Acute cortical/subcortical infarcts of the left parietal and occipital lobes. No hemorrhage.  Small chronic left occipital and right frontal infarcts.  Chronic microvascular ischemic changes.   Impression: 70 year  old white female with history outlined above, admitted with acute onset of confusion and disorientation after newly starting chemotherapy in the setting of ESRD on HD. MRI shows acute infarcts at left parietal and occipital lobes etiology likely cardioembolic versus hypercoagulability from cancer.  Patient did have cardiac arrest there may be a small component of anoxic injury superimposed however EEG showed only mild diffuse slowing consistent with only mild encephalopathy does not support diagnosis of severe hypoxic ischemic encephalopathy from patient's condition may improve after sedation is weaned off..   Recommendations: -Continue supportive care for the next few days to see if there is improvement patient can be extubated.  Neurological exam limited due to sedation the present time.  Recommend light sedation get a better neurological exam.   Repeat echocardiogram to get better idea about cardiac functioning long discussion with patient's son and daughter-in-law at the bedside and answered questions.  Discussed with Dr. Noemi Chapel.   This patient is critically ill and at significant risk of neurological worsening, death and care requires constant monitoring of vital signs, hemodynamics,respiratory and cardiac monitoring, extensive review of multiple databases, frequent neurological assessment, discussion with family, other specialists and medical decision making of high complexity.I have made  any additions or clarifications directly to the above note.This critical care time does not reflect procedure time, or teaching time or supervisory time of PA/NP/Med Resident etc but could involve care discussion time.  I spent 30 minutes of neurocritical care time  in the care of  this patient.    Antony Contras, MD

## 2020-10-16 NOTE — Telephone Encounter (Signed)
Pt sister called and stated she is in the hospital. She had a reaction to her chemo and they don't know what the end result will be. She is in [facility] ICU

## 2020-10-16 NOTE — Progress Notes (Signed)
SLP Cancellation Note  Patient Details Name: AL GAGEN MRN: [id number] DOB: [birth year]   Cancelled treatment:       Reason Eval/Treat Not Completed: Medical issues which prohibited therapy (Pt is currently on the vent. SLP will follow up.)  Franchon Ketterman I. Hardin Negus, [place], [place] Office number [phone] Pager [place] 10/16/2020, 8:03 AM

## 2020-10-16 NOTE — Progress Notes (Signed)
I had a discussion today during rounds with Ms. Vanvorst' 2 sons and daughter in law. They are concerned about her previous poor quality of life and her future quality of life. They indicated she would never wanted to have been on life support. They feel that withdrawing care once family has been able to see her would be what she would want at this stage in her life. They are waiting on a family member to come in from [place] to see her and agreed that tomorrow morning may be the best time to plan for withdrawing care so that family is not rushed. In the interim we will not be restarting CVVHD (had stopped due to catheter malfunction this afternoon) and we will not be escalating care (no addition of pressors, no cultures, no treatment of additional arrhythmias). If she is deteriorating, we will let family know but let her pass away naturally. She will be terminally extubated tomorrow when family is ready. Bedside RN updated on plan.  Julian Hy, DO 10/16/20 3:52 PM [facility]

## 2020-10-16 NOTE — Progress Notes (Signed)
NAME:  [name], MRN:  [id number], DOB:  05/18/1951, LOS: 2 ADMISSION DATE:  10/06/2020, CONSULTATION DATE:  10/04/2020 REFERRING MD:  Dr. Pietro Cassis TRH, CHIEF COMPLAINT:  Cardiac arrest   History of Present Illness:  70 year old female with PMH significant for ESRD on HD, CAD, DM, and HTN. 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 She is status post hemicolectomy and was just started on palliative chemotherapy 3/24 with FOLFOX and 5-FU. She woke up 3/25 with some confusion, which persisted and worsened throughout the day causing her to present to [facility] ED. In the ED, her speech did not make sense and there was concern for acute stroke. Neuroimaging was unremarkable. Neurology consultation was obtained and further studies were ordered. Etiology remained uncertain. She was admitted to the hospitalist service with neuro workup ongoing and with supportive care for her chronic medical issues. At approximately 2130 she suffered VF cardiac arrest and code blue was called. She underwent approximately 15 minutes of ACLS with eventual ROSC. See code sheet for detailed report, but she did receive defibrillation, amiodarone, epinephrine, magnesium, and sodium bicarb in the midst of resuscitation. Post-code she was transferred to ICU for further care.   Pertinent  Medical History  Renal Cell Carcinoma Adenocarcinoma of Bowel  Anemia  Chronic Renal Failure  Uncontrolled DM with diabetic retinopathy, nephropathy, right foot ulceration HTN Diastolic Dysfunction - grade I, mild LVH, LVEF 40% in 2019 HLD NSVT - 2020  Significant Hospital Events: Including procedures, antibiotic start and stop dates in addition to other pertinent events   . 3/25 Admit for AMS with neuro workup ongoing, suffered VF arrest 15 minutes. L CT placed.  . 3/26 On vent, following commands . 3/27 Worsening of SQ air, ? PTX on CXR, increased levo needs /  vaso added, CVVHD .    Interim History / Subjective:  Afebrile / Tmax 97.8, WBC 7.5 Vent - 40% / PEEP 5  Glucose range 120-262  Objective   Blood pressure (!) 149/16, pulse 76, temperature 97.6 F (36.4 C), temperature source Axillary, resp. rate (!) 22, height 5\' 7"  (1.702 m), weight 92.8 kg, SpO2 100 %.    Vent Mode: PRVC FiO2 (%):  [40 %-60 %] 50 % Set Rate:  [22 bmp] 22 bmp Vt Set:  [490 mL] 490 mL PEEP:  [5 cmH20] 5 cmH20 Plateau Pressure:  [16 cmH20-19 cmH20] 18 cmH20   Intake/Output Summary (Last 24 hours) at 10/16/2020 0658 Last data filed at 10/16/2020 0600 Gross per 24 hour  Intake 3186.11 ml  Output 2150 ml  Net 1036.11 ml   Filed Weights   10/12/2020 2200 10/15/20 0440 10/16/20 0500  Weight: 88.6 kg 91.1 kg 92.8 kg    Examination: General: Critically ill appearing adult female lying in bed covered w/ bair hugger HEENT: MMM. PERRLA. ETT, sub-Q air noted into neck CV: RRR, no m/r/g, 1+ BLE edema.  PULM: On full vent. Distant lung sounds. Bibasilar crackles. No wheezing. Sub-Q air noted throughout left chest / arm. Two chest tube in place on left side.  GI: soft. NT/ND. Active BS.  Extremities: Well-perfused. Feet cool to touch Skin: Dry and warm. no rashes or lesions Neuro: On sedation. Opens eyes. Withdraws to painful stimuli.       Resolved Hospital Problem list     Assessment & Plan:   Goals of Care Per oncology, The patient's underlying malignancy is incurable so  the intent of her chemotherapy was palliative. Patient is DNR/DNI. During family meeting, her two sons expressed that their mother did not want to be on life support. She only continued HD so she could have more time with her grandchildren. Family plan on respecting her wishes by transitioning her to comfort care tomorrow morning to allow other family members to come see patient.  --DNR/DNI --Transition to comfort care tomorrow AM --Do not escalate care --Call family if patient starts to expire  overnight. Miguel Dibble [phone])  VF cardiac arrest, VTach Acute hypoxemic respiratory failure 2/2 cardiac arrest Thought to be due to prolonged QTC. Likely combination of CAD plus recent chemotherapy but ECHO with limited window due to subcutaneous air. Downtime 15 mins, ROSC. Now intubated and on sedation. Rhythm now in and out of NSR w/ BBB. After discussing Martin with family, they decided to transition patient to comfort measures.  P:  --Continue ICU monitoring --Continue vent support for now, plan to transition to comfort care tomorrow AM so family have chance to see patient.  --Continue fentanyl drip   Shock  Baseline hypotension on midodrine, may also be seeing hemodynamic effect of left PTX despite chest tube. BP improved overnight. sBP in the 90s-120s. On Vasopressin and Levophed.  --Continue Levophed, Vasopressin for MAP >60, wean as tolerated by BP  Left Pneumothorax Likely secondary to CPR. Chest tube to suction.  --Chest tube site care per protocol --Daily CXR  Acute Metabolic Encephalopathy: this is the original reason for admission and proceeded cardiac arrest. Neuroimaging unremarkable. Spot EEG without sz. CVA and medication effect considered. Neurology raises concers of PRES from either decadron or bevacizumab.  --Appreciate Neuro evaluation  --MRI 3/26 with acute cortical / subcortical infarcts of the left parietal and occipital lobes, small chronic left occipital / right frontal infarcts, chronic microvascular changes  --Neuro recommending repeat ECHO --Decrease sedation  Adenocarcinoma of the colon with mets  Metastasize to the peritoneal surface. S/p hemicolectomy in Feb. Started palliative chemo with FOLFOX and 5-FU on 3/24.  --Continue to hold home 5-FU infusion  ESRD TTS HD Anemia of chronic disease Currently getting CVVHD. Renal function improved. Hyperkalemia resolved (K+ 5.2 --> 4.5). Hgb stable at 8.8. Family transitioning to comfort care.  --Nephro  following, appreciate recs --Discontinue HD  Prolonged QTc --Tele  Leukocytosis, resolved  T2DM A1C of 6.4. Blood sugars not well-controlled.  --SSI, resistant scale  --Lantus 20 u BID -Add 4 units Q4 TF coverage  At Risk Malnutrition  -TF per Nutrition    Best practice (right click and "Reselect all SmartList Selections" daily)  Diet:  Tube Feed  Pain/Anxiety/Delirium protocol (if indicated): Yes (RASS goal -1) VAP protocol (if indicated): Yes DVT prophylaxis: Subcutaneous Heparin GI prophylaxis: PPI Glucose control:  SSI Yes and Basal insulin Yes Central venous access:  Yes, and it is still needed Arterial line:  N/A Foley:  N/A Mobility:  bed rest  PT consulted: N/A Last date of multidisciplinary goals of care discussion [3/25] Code Status:  full code: DNR, Family wants to transition to comfort care Disposition: ICU  Labs   CBC: Recent Labs  Lab 10/11/20 1506 10/09/2020 1347 10/09/2020 1403 10/08/2020 2153 10/03/2020 2218 10/14/20 0400 10/15/20 0325 10/15/20 1151 10/16/20 0410  WBC 6.6 7.7  --  12.1*  --  15.2* 12.6*  --  7.5  NEUTROABS 4.6 5.8  --   --   --   --   --   --   --   HGB 9.9* 9.2*   < >  11.4* 9.2* 9.9* 9.9* 8.8* 8.8*  HCT 29.7* 27.9*   < > 33.9* 27.0* 29.6* 30.0* 26.0* 26.5*  MCV 99.3 100.0  --  100.3*  --  99.3 99.0  --  100.4*  PLT 162 148*  --  169  --  259 173  --  100*   < > = values in this interval not displayed.    Basic Metabolic Panel: Recent Labs  Lab 10/14/20 0400 10/14/20 1124 10/14/20 2045 10/15/20 0325 10/15/20 1151 10/15/20 1155 10/15/20 1415 10/16/20 0410  NA 137  --   --  134* 135 136 137 136  K 4.9  --   --  5.4* 5.6* 5.7* 5.2* 4.5  CL 98  --   --  96*  --  96* 98 99  CO2 24  --   --  22  --  26 27 25   GLUCOSE 265*  --   --  324*  --  295* 299* 169*  BUN 26*  --   --  37*  --  46* 44* 33*  CREATININE 5.82*  --   --  6.62*  --  7.39* 6.80* 4.17*  CALCIUM 8.9  --   --  8.1*  --  8.2* 8.1* 8.4*  MG  --  2.0 2.0 1.9  --    --  2.5* 2.5*  PHOS  --  8.2* 8.7* 8.4*  --   --  7.3*  7.6* 5.0*   GFR: Estimated Creatinine Clearance: 14.7 mL/min (A) (by C-G formula based on SCr of 4.17 mg/dL (H)). Recent Labs  Lab 10/04/2020 2153 10/14/20 0230 10/14/20 0400 10/15/20 0325 10/15/20 0559 10/16/20 0410  WBC 12.1*  --  15.2* 12.6*  --  7.5  LATICACIDVEN 9.5* 3.1*  --   --  2.4*  --     Liver Function Tests: Recent Labs  Lab 10/11/20 1506 10/14/2020 1347 09/28/2020 2153 10/15/20 1415 10/16/20 0410  AST 15 16 91*  --   --   ALT 11 10 61*  --   --   ALKPHOS 66 52 53  --   --   BILITOT 0.4 0.3 0.8  --   --   PROT 7.2 6.5 5.9*  --   --   ALBUMIN 3.3* 3.3* 3.0* 2.5* 2.6*   No results for input(s): LIPASE, AMYLASE in the last 168 hours. Recent Labs  Lab 09/25/2020 1720  AMMONIA 31    ABG    Component Value Date/Time   PHART 7.444 10/15/2020 1151   PCO2ART 41.1 10/15/2020 1151   PO2ART 69 (L) 10/15/2020 1151   HCO3 28.2 (H) 10/15/2020 1151   TCO2 29 10/15/2020 1151   O2SAT 94.0 10/15/2020 1151     Coagulation Profile: Recent Labs  Lab 09/22/2020 1347  INR 1.0    Cardiac Enzymes: No results for input(s): CKTOTAL, CKMB, CKMBINDEX, TROPONINI in the last 168 hours.  HbA1C: Hgb A1c MFr Bld  Date/Time Value Ref Range Status  10/11/2020 01:47 PM 6.2 (H) 4.8 - 5.6 % Final    Comment:    (NOTE) Pre diabetes:          5.7%-6.4%  Diabetes:              >6.4%  Glycemic control for   <7.0% adults with diabetes   08/28/2020 12:02 AM 7.8 (H) 4.8 - 5.6 % Final    Comment:    (NOTE) Pre diabetes:          5.7%-6.4%  Diabetes:              >  6.4%  Glycemic control for   <7.0% adults with diabetes     CBG: Recent Labs  Lab 10/15/20 1329 10/15/20 1547 10/15/20 2054 10/15/20 2327 10/16/20 0323  GLUCAP 331* 262* 168* 135* 120*        Critical care time: 25 minutes

## 2020-10-16 NOTE — Progress Notes (Addendum)
Progress Note  Patient Name: [name] Date of Encounter: 10/16/2020  Primary Cardiologist: Quay Burow, MD  Subjective   Remains intubated, critically ill, mits in place. Son at bedside.  Inpatient Medications    Scheduled Meds: . aspirin  81 mg Per Tube Daily   Or  . aspirin  300 mg Rectal Daily  . budesonide (PULMICORT) nebulizer solution  0.5 mg Nebulization BID  . chlorhexidine gluconate (MEDLINE KIT)  15 mL Mouth Rinse BID  . Chlorhexidine Gluconate Cloth  6 each Topical Daily  . clopidogrel  75 mg Per Tube Daily  . docusate  100 mg Per Tube BID  . feeding supplement (PROSource TF)  90 mL Per Tube TID  . heparin  5,000 Units Subcutaneous Q8H  . insulin aspart  0-20 Units Subcutaneous Q4H  . insulin aspart  4 Units Subcutaneous Q4H  . insulin glargine  35 Units Subcutaneous QHS  . mouth rinse  15 mL Mouth Rinse 10 times per day  . pantoprazole sodium  40 mg Per Tube Q1200  . polyethylene glycol  17 g Per Tube Daily  . rosuvastatin  20 mg Per Tube Daily  . senna  1 tablet Per Tube BID  . sodium chloride flush  10-40 mL Intracatheter Q12H   Continuous Infusions: . amiodarone 30 mg/hr (10/16/20 1000)  . ceFEPime (MAXIPIME) IV 200 mL/hr at 10/16/20 1000  . feeding supplement (VITAL 1.5 CAL) 1,000 mL (10/15/20 1923)  . fentaNYL infusion INTRAVENOUS 75 mcg/hr (10/16/20 1000)  . norepinephrine (LEVOPHED) Adult infusion 30 mcg/min (10/16/20 1000)  . prismasol BGK 2/2.5 dialysis solution 1,500 mL/hr at 10/16/20 0732  . prismasol BGK 2/2.5 replacement solution 500 mL/hr at 10/16/20 0933  . prismasol BGK 2/2.5 replacement solution 300 mL/hr at 10/16/20 0332  . thiamine injection 500 mg (10/16/20 1006)  . vancomycin Stopped (10/15/20 1526)  . vasopressin 0.03 Units/min (10/16/20 1000)   PRN Meds: acetaminophen **OR** acetaminophen, albuterol, fentaNYL (SUBLIMAZE) injection, heparin, hydrALAZINE, midazolam, ondansetron (ZOFRAN) IV, polyethylene glycol, sodium  chloride flush   Vital Signs    Vitals:   10/16/20 0915 10/16/20 0930 10/16/20 0945 10/16/20 1000  BP:      Pulse: 72   73  Resp: (!) 22 (!) 22 (!) 22 (!) 21  Temp:      TempSrc:      SpO2: 99%   99%  Weight:      Height:        Intake/Output Summary (Last 24 hours) at 10/16/2020 1027 Last data filed at 10/16/2020 1000 Gross per 24 hour  Intake 3369.86 ml  Output 3220 ml  Net 149.86 ml   Last 3 Weights 10/16/2020 10/15/2020 09/22/2020  Weight (lbs) 204 lb 9.4 oz 200 lb 13.4 oz 195 lb 5.2 oz  Weight (kg) 92.8 kg 91.1 kg 88.6 kg     Telemetry    NSR overnight into this AM with occasional PVCs - Personally Reviewed  ECG    Reviewed yesterday's tracing - NSR LBBB, LAD - Personally Reviewed  Physical Exam   GEN: No acute distress. Intubated with warmer in place. Obese HEENT: Normocephalic, atraumatic, sclera non-icteric. Neck: No JVD or bruits. Cardiac: RRR no murmurs, rubs, or gallops.  Respiratory: Clear to auscultation bilaterally. Breathing is unlabored. GI: Soft, nontender, non-distended, BS +x 4. MS: no deformity. Extremities: No clubbing or cyanosis. Trace  edema.  Neuro:  Sedated on vent, opens and closes eyes periodically Psych:  Unable to assess  Labs    High Sensitivity  Troponin:   Recent Labs  Lab 09/22/2020 2153 10/14/20 0400 10/14/20 0745 10/14/20 1124 10/15/20 1155  TROPONINIHS 59* 517* 836* 906* 430*      Cardiac EnzymesNo results for input(s): TROPONINI in the last 168 hours. No results for input(s): TROPIPOC in the last 168 hours.   Chemistry Recent Labs  Lab 10/11/20 1506 10/11/20 1506 09/23/2020 1347 10/10/2020 1403 10/07/2020 2153 10/16/2020 2218 10/15/20 1155 10/15/20 1415 10/16/20 0410  NA 144  --  139   < > 140   < > 136 137 136  K 4.6  --  4.4   < > 4.7   < > 5.7* 5.2* 4.5  CL 98  --  99   < > 101   < > 96* 98 99  CO2 31  --  28  --  21*   < > 26 27 25   GLUCOSE 208*  --  118*   < > 217*   < > 295* 299* 169*  BUN 18  --  21   < > 22    < > 46* 44* 33*  CREATININE 5.82*  --  4.84*   < > 5.26*   < > 7.39* 6.80* 4.17*  CALCIUM 9.2  --  8.9  --  9.2   < > 8.2* 8.1* 8.4*  PROT 7.2  --  6.5  --  5.9*  --   --   --   --   ALBUMIN 3.3*  --  3.3*  --  3.0*  --   --  2.5* 2.6*  AST 15  --  16  --  91*  --   --   --   --   ALT 11  --  10  --  61*  --   --   --   --   ALKPHOS 66  --  52  --  53  --   --   --   --   BILITOT 0.4  --  0.3  --  0.8  --   --   --   --   GFRNONAA 7*   < > 9*  --  8*   < > 5* 6* 11*  ANIONGAP 15  --  12  --  18*   < > 14 12 12    < > = values in this interval not displayed.     Hematology Recent Labs  Lab 10/14/20 0400 10/15/20 0325 10/15/20 1151 10/16/20 0410  WBC 15.2* 12.6*  --  7.5  RBC 2.98* 3.03*  --  2.64*  HGB 9.9* 9.9* 8.8* 8.8*  HCT 29.6* 30.0* 26.0* 26.5*  MCV 99.3 99.0  --  100.4*  MCH 33.2 32.7  --  33.3  MCHC 33.4 33.0  --  33.2  RDW 15.6* 16.0*  --  15.9*  PLT 259 173  --  100*    BNP Recent Labs  Lab 10/12/2020 2330  BNP 271.0*     DDimer No results for input(s): DDIMER in the last 168 hours.   Radiology    DG Chest 1 View  Result Date: 10/15/2020 CLINICAL DATA:  Central line placement EXAM: CHEST  1 VIEW COMPARISON:  Portable exam 1057 hours compared to 0516 hours FINDINGS: Tip of endotracheal tube projects 3.3 cm above carina. Nasogastric tube extends into abdomen. Two LEFT thoracostomy tubes present, pigtail new since prior study. RIGHT jugular Port-A-Cath with tip projecting over cavoatrial junction. New LEFT jugular central venous catheter with tip projecting over  SVC. Tips of lung apices excluded. Normal heart size and mediastinal contours. Extensive chest wall pneumatosis projects over thorax bilaterally. Linear gas collection at medial inferior LEFT hemithorax likely medial LEFT pneumothorax. LEFT pneumothorax is significantly decreased following second chest tube insertion. Central peribronchial thickening and bibasilar atelectasis versus infiltrate unchanged.  IMPRESSION: Decrease in LEFT pneumothorax following second thoracostomy tube insertion. New LEFT jugular line with tip projecting over SVC. Persistent bronchitic changes and bibasilar atelectasis versus consolidation. Extensive chest wall emphysema. Electronically Signed   By: Lavonia Dana M.D.   On: 10/15/2020 11:16   MR BRAIN WO CONTRAST  Result Date: 10/14/2020 CLINICAL DATA:  TIA EXAM: MRI HEAD WITHOUT CONTRAST TECHNIQUE: Multiplanar, multiecho pulse sequences of the brain and surrounding structures were obtained without intravenous contrast. COMPARISON:  None. FINDINGS: Brain: Cortical/subcortical reduced diffusion in the left parietal and occipital lobes. No evidence of hemorrhage. There is no intracranial mass or significant mass effect. Patchy foci of T2 hyperintensity in the supratentorial and pontine white matter are nonspecific may reflect mild to moderate chronic microvascular ischemic changes. Prominence of the ventricles and sulci reflects generalized parenchymal volume loss. Chronic parasagittal left occipital infarct. Small chronic right middle frontal gyrus infarct. There is no hydrocephalus or extra-axial fluid collection. Vascular: Major vessel flow voids at the skull base are preserved. Skull and upper cervical spine: Normal marrow signal is preserved. Sinuses/Orbits: Minor mucosal thickening.  Orbits are unremarkable. Other: Sella is unremarkable.  Mastoid air cells are clear. IMPRESSION: Acute cortical/subcortical infarcts of the left parietal and occipital lobes. No hemorrhage. Small chronic left occipital and right frontal infarcts. Chronic microvascular ischemic changes. Electronically Signed   By: Macy Mis M.D.   On: 10/14/2020 13:55   DG CHEST PORT 1 VIEW  Result Date: 10/16/2020 CLINICAL DATA:  Acute respiratory failure, hypoxia EXAM: PORTABLE CHEST 1 VIEW COMPARISON:  10/15/2020 FINDINGS: Endotracheal tube is seen 2.4 cm above the carina. Nasogastric tube extends into the  upper abdomen. Left internal jugular hemodialysis catheter tip is unchanged when accounting for changes in patient positioning with its tip overlying the brachiocephalic vein. Right internal jugular chest port tip noted within the right atrium. Left apical pigtail chest tube is unchanged. Additional left mid lung zone large bore chest tube is again noted. There is increasing lucency involving the left hemithorax which may relate to increasing left basilar collapse and compensatory hyperinflation of the left upper lobe. An anterior, loculated pneumothorax could result in a similar appearance, but is considered less likely. No definite pneumothorax is identified. There is increasing bibasilar atelectasis or infiltrate. Cardiac size within normal limits. Extensive gas within the left chest wall again identified. IMPRESSION: Stable support lines and tubes. Increasing lucency of the left hemithorax likely related to hyperinflation of the left upper lobe in the setting of increasing left basilar atelectasis or infiltrate. While and anteriorly loculated pneumothorax could result in a similar appearance, this is considered less likely. Progressive bibasilar atelectasis or infiltrate. Electronically Signed   By: Fidela Salisbury MD   On: 10/16/2020 07:16   DG CHEST PORT 1 VIEW  Result Date: 10/15/2020 CLINICAL DATA:  Acute respiratory failure with hypoxia EXAM: PORTABLE CHEST 1 VIEW COMPARISON:  10/14/2020 FINDINGS: Indwelling left chest tube. Moderate left pneumothorax, progressive. Extensive subcutaneous emphysema predominantly over the left chest wall, progressive. Patchy bilateral lower lobe opacities, likely atelectasis. No pleural effusions. Endotracheal tube terminates 4.8 cm above the carina. Right chest power port terminates the cavoatrial junction. Enteric tube courses into the stomach. The heart is  normal in size. IMPRESSION: Moderate left pneumothorax, progressive. Indwelling left chest tube. Extensive  subcutaneous emphysema, progressive. These results will be called to the ordering clinician or representative by the Radiologist Assistant, and communication documented in the PACS or [employer]. Electronically Signed   By: Julian Hy M.D.   On: 10/15/2020 05:43   ECHOCARDIOGRAM LIMITED  Result Date: 10/14/2020    ECHOCARDIOGRAM REPORT   Patient Name:   [name] Date of Exam: 10/14/2020 Medical Rec #:  [id number]     Height:       67.0 in Accession #:    [id number]    Weight:       195.3 lb Date of Birth:  10-18-1950     BSA:          2.002 m Patient Age:    18 years      BP:           120/70 mmHg Patient Gender: F             HR:           81 bpm. Exam Location:  Inpatient Procedure: Limited Echo, Color Doppler and Cardiac Doppler Indications:    stroke  History:        Patient has prior history of Echocardiogram examinations, most                 recent 03/02/2018. Cancer. end stage renal disease.,                 Arrythmias:Ventricular Fibrillation; Risk Factors:Hypertension.  Sonographer:    Johny Chess Referring Phys: [id number] [facility]  Sonographer Comments: Technically difficult study due to poor echo windows, Technically challenging study due to limited acoustic windows, suboptimal parasternal window and echo performed with patient supine and on artificial respirator. Image acquisition challenging due to patient body habitus and Image acquisition challenging due to respiratory motion. IMPRESSIONS  1. Extremely limited; LV function ? preserved; no pericardial effusion; no other information could be obtained with this study; suggest TEE if clinically indicated.  2. Left ventricular ejection fraction, by estimation, is Cannot be assessed%. The left ventricle has Cannot be assessed function. Left ventricular endocardial border not optimally defined to evaluate regional wall motion. The left ventricular internal cavity size was Not assessed. There is Not assessed left ventricular  hypertrophy. Left ventricular diastolic function could not be evaluated.  3. Right ventricular systolic function is normal. The right ventricular size is normal.  4. The mitral valve was not assessed. Not fully assessed mitral valve regurgitation.  5. Tricuspid valve regurgitation Not fully assessed.  6. The aortic valve was not assessed. Aortic valve regurgitation Not assessed.  7. Pulmonic valve regurgitation Not assessed. FINDINGS  Left Ventricle: Left ventricular ejection fraction, by estimation, is Cannot be assessed%. The left ventricle has Cannot be assessed function. Left ventricular endocardial border not optimally defined to evaluate regional wall motion. The left ventricular internal cavity size was Not assessed. There is Not assessed left ventricular hypertrophy. Left ventricular diastolic function could not be evaluated. Right Ventricle: The right ventricular size is normal. Right vetricular wall thickness was not assessed. Right ventricular systolic function is normal. Left Atrium: Left atrial size was not assessed. Right Atrium: Right atrial size was not assessed. Pericardium: There is no evidence of pericardial effusion. Mitral Valve: The mitral valve was not assessed. Not fully assessed mitral valve regurgitation. Tricuspid Valve: The tricuspid valve is not assessed. Tricuspid valve regurgitation Not fully assessed. No evidence  of tricuspid stenosis. Aortic Valve: The aortic valve was not assessed. Aortic valve regurgitation Not assessed. Pulmonic Valve: The pulmonic valve was not assessed. Pulmonic valve regurgitation Not assessed. Aorta: Aortic root could not be assessed. Venous: The inferior vena cava was not well visualized.  Additional Comments: Extremely limited; LV function ? preserved; no pericardial effusion; no other information could be obtained with this study; suggest TEE if clinically indicated.  MV E velocity: 54.40 cm/s MV A velocity: 83.10 cm/s MV E/A ratio:  0.65 Kirk Ruths MD  Electronically signed by Kirk Ruths MD Signature Date/Time: 10/14/2020/2:08:03 PM    Final     Cardiac Studies   Limited echo 10/14/20 1. Extremely limited; LV function ? preserved; no pericardial effusion;  no other information could be obtained with this study; suggest TEE if  clinically indicated.  2. Left ventricular ejection fraction, by estimation, is Cannot be  assessed%. The left ventricle has Cannot be assessed function. Left  ventricular endocardial border not optimally defined to evaluate regional  wall motion. The left ventricular internal  cavity size was Not assessed. There is Not assessed left ventricular  hypertrophy. Left ventricular diastolic function could not be evaluated.  3. Right ventricular systolic function is normal. The right ventricular  size is normal.  4. The mitral valve was not assessed. Not fully assessed mitral valve  regurgitation.  5. Tricuspid valve regurgitation Not fully assessed.  6. The aortic valve was not assessed. Aortic valve regurgitation Not  assessed.  7. Pulmonic valve regurgitation Not assessed.   Patient Profile     70 y.o. female with ESRD on hemodialysis, metastatic colon cancer s/p RHC on palliative chemotherapy, prior HTN with chronic hypotension, CAD (coronary calcification seen on CT chest, 2019 nuclear med stress test showed no evidence of ischemia or infarction), diabetes with retinopathy, neuropathy, renal cell cancer, hypertension, anemia, remote strokes on MRI, question of prior alcoholism admitted with confusion the day after her first chemotherapy treatment. Labs were notable for new anemia and she was admitted for further workup. Overnight on 10/12/2020 she suffered a VF arrest requiring resuscitation. This was felt due to QT prolongation with presumptive relationship to FOLFOX. Also noted to have LBBB. She then had ventricular tachycardia on 10/15/20 which self-terminated. She also developed afib around the time of this  episode, started on amiodarone. She was found to have acute strokes seen on MRI (acute cortical/subcortical infarcts of the left parietal and occipital lobe). Other hospital issues include left pneumothorax s/p chest tube placement and shock requiring pressor support.  Assessment & Plan    1. VF arrest/ventricular tachycardia felt due to prolonged QTc - inciting event not totally clear, mild hyperkalemia noted on labs earlier this admission - question related to chemotherapy or underlying CAD with ventricular arrhythmia from metabolic derangements - per consult note yesterday, MD felt that given her critical illness requiring pressor support, ESRD, incurable metastatic colon CA on palliative chemo and recent strokes, coronary angiography is not indicated at this time - last K 4.5, Mg 2.5 - QTC still appears prolonged on tele but challenging to tell, QRS appears slightly more narrow - will order follow up EKG this AM and review plan with MD (remains on IV amiodarone - in NSR with occasional PVCs, no further WCT/AF this AM)  2. New LBBB - per notes, some of her wide complex rhythms were AF RVR but also with ventricular arrhythmias this admission - see above regarding ischemic eval - repeat EKG today  3. Paroxysmal atrial fibrillation -  TSH wnl - per consultation note, hold anticoagulation in setting of recent strokes (notably she was started on DAPT [place] by neuro) - in NSR this AM  4. Coronary artery calcification - hsTroponin elevation to 906 felt expected with cardiac arrest - conservative management planned given comorbidites - on ASA/Plavix/statin given strokes, continue to follow goals of care  5. Remainder of medical issues per primary team - shock on pressor therapy with norepinephrine/vasopressin - ESRD - chronic anemia - strokes on MRI -> started on ASA / Plavix per neurology teleconsultation - PTX - metastatic colon CA, on palliative chemotherapy prior to admission, now DNR  status per goals of care note  For questions or updates, please contact [facility] Please consult [email or web address] for contact info under Cardiology/STEMI.  Signed, Charlie Pitter, PA-C 10/16/2020, 10:27 AM     The patient was seen, examined and discussed with Melina Copa, PA-C and I agree with the above.   The patient remains critically ill, intubated and sedated, no new events on telemetry overnight, she is in sinus rhythm, her QTC today is 360 ms, QTC 408 ms, she has normal sodium and potassium, as well as magnesium today, I will continue her on IV amiodarone, will continue to follow.  It appears that her long QT and V. fib arrest was related to metabolic disarray on admission, her troponin elevation most probably demand ischemia associated with cardiac arrest, I would not proceed with ischemic work-up.  Goals of care to be addressed by primary team.  Ena Dawley, MD 10/16/2020

## 2020-10-17 DIAGNOSIS — G459 Transient cerebral ischemic attack, unspecified: Secondary | ICD-10-CM | POA: Diagnosis not present

## 2020-10-17 MED ORDER — MIDAZOLAM BOLUS VIA INFUSION (WITHDRAWAL LIFE SUSTAINING TX)
2.0000 mg | INTRAVENOUS | Status: DC | PRN
  Filled 2020-10-17: qty 2

## 2020-10-17 MED ORDER — GLYCOPYRROLATE 0.2 MG/ML IJ SOLN: 0.2000 mg | INTRAMUSCULAR | Status: DC | PRN

## 2020-10-17 MED ORDER — VANCOMYCIN VARIABLE DOSE PER UNSTABLE RENAL FUNCTION (PHARMACIST DOSING): Status: DC

## 2020-10-17 MED ORDER — SODIUM CHLORIDE 0.9 % IV SOLN
1.0000 g | INTRAVENOUS | Status: DC
  Filled 2020-10-17: qty 1

## 2020-10-17 MED ORDER — DIPHENHYDRAMINE HCL 50 MG/ML IJ SOLN: 25.0000 mg | INTRAMUSCULAR | Status: DC | PRN

## 2020-10-17 MED ORDER — MIDAZOLAM HCL 2 MG/2ML IJ SOLN: 1.0000 mg | INTRAMUSCULAR | Status: DC | PRN

## 2020-10-17 MED ORDER — MIDAZOLAM 50MG/50ML (1MG/ML) PREMIX INFUSION
0.0000 mg/h | INTRAVENOUS | Status: DC
  Administered 2020-10-17: 1 mg/h via INTRAVENOUS
  Filled 2020-10-17: qty 50

## 2020-10-17 MED ORDER — POLYVINYL ALCOHOL 1.4 % OP SOLN
1.0000 [drp] | Freq: Four times a day (QID) | OPHTHALMIC | Status: DC | PRN
Start: 2020-10-17 — End: 2020-10-17

## 2020-10-17 MED ORDER — GLYCOPYRROLATE 1 MG PO TABS: 1.0000 mg | ORAL_TABLET | ORAL | Status: DC | PRN

## 2020-10-18 ENCOUNTER — Encounter: Payer: Medicare Other | Admitting: Podiatry

## 2020-10-18 ENCOUNTER — Ambulatory Visit: Payer: Medicare Other | Admitting: Physician Assistant

## 2020-10-18 ENCOUNTER — Other Ambulatory Visit: Payer: Medicare Other

## 2020-10-20 LAB — CULTURE, BLOOD (ROUTINE X 2)
Culture: NO GROWTH
Culture: NO GROWTH

## 2020-10-20 NOTE — Progress Notes (Signed)
NAME:  [name], MRN:  [id number], DOB:  24-Nov-1950, LOS: 3 ADMISSION DATE:  10/15/2020, CONSULTATION DATE:  09/22/2020 REFERRING MD:  Dr. Pietro Cassis TR[facility], CHIEF COMPLAINT:  Cardiac arrest   History of Present Illness:  70 year old female with PMH significant for ESRD on HD, CAD, DM, and HTN. 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 She is status post hemicolectomy and was just started on palliative chemotherapy 3/24 with FOLFOX and 5-FU. She woke up 3/25 with some confusion, which persisted and worsened throughout the day causing her to present to [facility] ED. In the ED, her speech did not make sense and there was concern for acute stroke. Neuroimaging was unremarkable. Neurology consultation was obtained and further studies were ordered. Etiology remained uncertain. She was admitted to the hospitalist service with neuro workup ongoing and with supportive care for her chronic medical issues. At approximately 2130 she suffered VF cardiac arrest and code blue was called. She underwent approximately 15 minutes of ACLS with eventual ROSC. See code sheet for detailed report, but she did receive defibrillation, amiodarone, epinephrine, magnesium, and sodium bicarb in the midst of resuscitation. Post-code she was transferred to ICU for further care.   Pertinent  Medical History  Renal Cell Carcinoma Adenocarcinoma of Bowel  Anemia  Chronic Renal Failure  Uncontrolled DM with diabetic retinopathy, nephropathy, right foot ulceration HTN Diastolic Dysfunction - grade I, mild LVH, LVEF 40% in 2019 HLD NSVT - 2020  Significant Hospital Events: Including procedures, antibiotic start and stop dates in addition to other pertinent events   . 3/25 Admit for AMS with neuro workup ongoing, suffered VF arrest 15 minutes. L CT placed.  . 3/26 On vent, following commands . 3/27 Worsening of SQ air, ? PTX on CXR, increased levo needs /  vaso added, CVVHD .    Interim History / Subjective:  No events overnight Multiple family members at bedside to see patient Plan for terminal extubation today.  Objective   Blood pressure (!) 69/37, pulse 70, temperature 97.9 F (36.6 C), temperature source Axillary, resp. rate 12, height 5\' 7"  (1.702 m), weight 93.9 kg, SpO2 (!) 57 %.    Vent Mode: PRVC FiO2 (%):  [30 %-40 %] 30 % Set Rate:  [22 bmp] 22 bmp Vt Set:  [490 mL] 490 mL PEEP:  [5 cmH20] 5 cmH20 Plateau Pressure:  [15 cmH20-18 cmH20] 17 cmH20   Intake/Output Summary (Last 24 hours) at 2020/10/19 1312 Last data filed at 10/19/2020 1030 Gross per 24 hour  Intake 1795.86 ml  Output 430 ml  Net 1365.86 ml   Filed Weights   10/15/20 0440 10/16/20 0500 19-Oct-2020 0341  Weight: 91.1 kg 92.8 kg 93.9 kg    Examination: General: Critically ill appearing adult female lying in bed covered w/ bair hugger HEENT: MMM. PERRLA. ETT, sub-Q air noted into neck CV: RRR, no m/r/g, 1+ BLE edema.  PULM: On full vent. Distant lung sounds. Bibasilar crackles. No wheezing. Sub-Q air noted throughout left chest / arm. Two chest tube in place on left side.  GI: soft. NT/ND. Active BS.  Extremities: Well-perfused. Feet cool to touch Skin: Dry and warm. no rashes or lesions Neuro: On sedation. Opens eyes. Withdraws to painful stimuli.       Resolved Hospital Problem list     Assessment & Plan:  VT cardiac Arrest Shock Acute hypoxic Respiratory Failure Acute Metabolic Encephalopathy Left pneumothorax  Acute CVA ESRD on HD Anemia of Chronic Disease Malnutrition  Goals of Care Per oncology, The patient's underlying malignancy is incurable so the intent of her chemotherapy was palliative. Patient is DNR/DNI. During family meeting, her two sons expressed that their mother did not want to be on life support. She only continued HD so she could have more time with her grandchildren. Family plan on respecting her wishes by transitioning  her to comfort care. Care was withdrawn this morning at the request of family so other family members couls spend time with patient before she expired.   Patient was extubated at 59 and EXPIRED at 1015.    Best practice (right click and "Reselect all SmartList Selections" daily)  Diet:  Tube Feed  Pain/Anxiety/Delirium protocol (if indicated): Yes (RASS goal -1) VAP protocol (if indicated): Yes DVT prophylaxis: Subcutaneous Heparin GI prophylaxis: PPI Glucose control:  SSI Yes and Basal insulin Yes Central venous access:  Yes, and it is still needed Arterial line:  N/A Foley:  N/A Mobility:  bed rest  PT consulted: N/A Last date of multidisciplinary goals of care discussion [3/25] Code Status:  full code: DNR, Family wants to transition to comfort care Disposition: ICU  Labs   CBC: Recent Labs  Lab 10/11/20 1506 10/12/2020 1347 10/14/2020 1403 09/22/2020 2153 10/03/2020 2218 10/14/20 0400 10/15/20 0325 10/15/20 1151 10/16/20 0410  WBC 6.6 7.7  --  12.1*  --  15.2* 12.6*  --  7.5  NEUTROABS 4.6 5.8  --   --   --   --   --   --   --   HGB 9.9* 9.2*   < > 11.4* 9.2* 9.9* 9.9* 8.8* 8.8*  HCT 29.7* 27.9*   < > 33.9* 27.0* 29.6* 30.0* 26.0* 26.5*  MCV 99.3 100.0  --  100.3*  --  99.3 99.0  --  100.4*  PLT 162 148*  --  169  --  259 173  --  100*   < > = values in this interval not displayed.    Basic Metabolic Panel: Recent Labs  Lab 10/14/20 0400 10/14/20 1124 10/14/20 2045 10/15/20 0325 10/15/20 1151 10/15/20 1155 10/15/20 1415 10/16/20 0410  NA 137  --   --  134* 135 136 137 136  K 4.9  --   --  5.4* 5.6* 5.7* 5.2* 4.5  CL 98  --   --  96*  --  96* 98 99  CO2 24  --   --  22  --  26 27 25   GLUCOSE 265*  --   --  324*  --  295* 299* 169*  BUN 26*  --   --  37*  --  46* 44* 33*  CREATININE 5.82*  --   --  6.62*  --  7.39* 6.80* 4.17*  CALCIUM 8.9  --   --  8.1*  --  8.2* 8.1* 8.4*  MG  --  2.0 2.0 1.9  --   --  2.5* 2.5*  PHOS  --  8.2* 8.7* 8.4*  --   --  7.3*   7.6* 5.0*   GFR: Estimated Creatinine Clearance: 14.8 mL/min (A) (by C-G formula based on SCr of 4.17 mg/dL (H)). Recent Labs  Lab 09/30/2020 2153 10/14/20 0230 10/14/20 0400 10/15/20 0325 10/15/20 0559 10/16/20 0410  WBC 12.1*  --  15.2* 12.6*  --  7.5  LATICACIDVEN 9.5* 3.1*  --   --  2.4*  --     Liver Function  Tests: Recent Labs  Lab 10/11/20 1506 10/05/2020 1347 10/03/2020 2153 10/15/20 1415 10/16/20 0410  AST 15 16 91*  --   --   ALT 11 10 61*  --   --   ALKPHOS 66 52 53  --   --   BILITOT 0.4 0.3 0.8  --   --   PROT 7.2 6.5 5.9*  --   --   ALBUMIN 3.3* 3.3* 3.0* 2.5* 2.6*   No results for input(s): LIPASE, AMYLASE in the last 168 hours. Recent Labs  Lab 09/29/2020 1720  AMMONIA 31    ABG    Component Value Date/Time   PHART 7.444 10/15/2020 1151   PCO2ART 41.1 10/15/2020 1151   PO2ART 69 (L) 10/15/2020 1151   HCO3 28.2 (H) 10/15/2020 1151   TCO2 29 10/15/2020 1151   O2SAT 94.0 10/15/2020 1151     Coagulation Profile: Recent Labs  Lab 09/22/2020 1347  INR 1.0    Cardiac Enzymes: No results for input(s): CKTOTAL, CKMB, CKMBINDEX, TROPONINI in the last 168 hours.  HbA1C: Hgb A1c MFr Bld  Date/Time Value Ref Range Status  10/16/2020 08:00 AM 6.4 (H) 4.8 - 5.6 % Final    Comment:    (NOTE) Pre diabetes:          5.7%-6.4%  Diabetes:              >6.4%  Glycemic control for   <7.0% adults with diabetes   10/14/2020 01:47 PM 6.2 (H) 4.8 - 5.6 % Final    Comment:    (NOTE) Pre diabetes:          5.7%-6.4%  Diabetes:              >6.4%  Glycemic control for   <7.0% adults with diabetes     CBG: Recent Labs  Lab 10/15/20 2327 10/16/20 0323 10/16/20 0721 10/16/20 1113 10/16/20 1535  GLUCAP 135* 120* 231* 312* 415*        Critical care time: 25 minutes

## 2020-10-20 NOTE — Progress Notes (Signed)
PHARMACY NOTE:  ANTIMICROBIAL RENAL DOSAGE ADJUSTMENT  Current antimicrobial regimen includes a mismatch between antimicrobial dosage and estimated renal function.  As per policy approved by the Pharmacy & Therapeutics and Medical Executive Committees, the antimicrobial dosage will be adjusted accordingly.  Current antimicrobial dosage:  Cefepime 2g IV q12h and vancomycin 1000mg  q24h - CRRT dosing   Indication: Sepsis  Renal Function: CRRT stopped yesterday due to catheter malfunction and no plans to resume CRRT    Antimicrobial dosage has been changed to:  Cefepime 1g q24h and vancomycin variable dosing (last dose given after CRRT off yesterday; would redose pending resuming renal replacement therapy)  Additional comments:   Thank you for allowing pharmacy to be a part of this patient's care.  Cristela Felt, PharmD Clinical Pharmacist  10/29/2020 7:30 AM

## 2020-10-20 NOTE — Progress Notes (Signed)
Pt extubated to room air per comfort care orders.

## 2020-10-20 NOTE — Plan of Care (Signed)
Son and D-I-L at bedside, planning on withdrawing care this morning. Withdraw of care order set placed, will place extubation order when family is ready.  Julian Hy, DO 11-01-2020 8:17 AM [facility]

## 2020-10-20 NOTE — Death Summary Note (Signed)
DEATH SUMMARY   Patient Details  Name: [name] MRN: [id number] DOB: 1950/09/15  Admission/Discharge Information   Admit Date:  10/31/2020  Date of Death: Date of Death: November 04, 2020  Time of Death: Time of Death: 05-Oct-1013  Length of Stay: 3  Referring Physician: [name], MD   Reason(s) for Hospitalization  Altered Mental Status  Diagnoses  Preliminary cause of death:  Secondary Diagnoses (including complications and co-morbidities):  Principal Problem:   TIA (transient ischemic attack) Active Problems:   Type 2 diabetes, controlled, with neuropathy ([place])   ESRD (end stage renal disease) ([place])   Adenocarcinoma of colon ([place])   Acute respiratory failure with hypoxia ([place])   VF (ventricular fibrillation) ([place])   QT prolongation   AMS (altered mental status)   Pressure injury of skin   Brief Hospital Course (including significant findings, care, treatment, and services provided and events leading to death)  [name] is a 70 y.o. year old female with significant PMH of ESRD on HD, CHF, HLD, CAD, DM, and HTN. She has a history of renal cell carcinoma and was recently diagnosed with adenocarcinoma of the bowel in 05-Oct-2022 of this year. She is status post hemicolectomy and was just started on palliative chemotherapy 3/24 with FOLFOX and 5-FU. She woke up 2022/11/01 with some confusion, which persisted and worsened throughout the day causing her to present to [facility] ED. In the ED, she had nonsensical speech and there was concern for acute stroke. CT head was unremarkable. Neurology consultation was obtained and further studies were ordered. Etiology remained uncertain. She was admitted to the hospitalist service with neuro workup ongoing and with supportive care for her chronic medical issues. At approximately 2128/10/05 she suffered VF cardiac arrest and code blue was called. She underwent approximately 15 minutes of ACLS with eventual ROSC. She received defibrillation, amiodarone,  epinephrine, magnesium, and sodium bicarb in the midst of resuscitation. Post-code, she was transferred to ICU for further care.  Transient Ischemic Attack  Acute Metabolic Encephalopathy Patient presented to the ED with confusion. CT head did not show any intracranial abnormalities. Spot EEG did not show any seizure activity.  Ammiona/TSH/ETOH wnl.  Neurology was consulted for further recommendations. Further imaging with MRI brain showed acute cortical/subcortical infarcts of the left parietal and occipital lobes and small chronic left occipital and right frontal infarcts.  An echocardiogram also limited due to subcutaneous air leading to poor acoustic window.  Patient mental status did not improve during ICU stay.   V. fib cardiac arrest V. tach arrest Prolonged QTC At approximately 10/05/28 on 11/01/2022, patient suffered VF cardiac arrest and code blue was called. She underwent approximately 15 minutes of ACLS with eventual ROSC. She received defibrillation, amiodarone, epinephrine, magnesium, and sodium bicarb in the midst of resuscitation.patient was subsequently intubated and transferred to the ICU for further monitoring.  Patient's VF was thought to be caused by prolonged QTC.  On 3/27, patient had several minutes of V. tach with a pulse that resolved without CPR.  Cardiology was consulted they recommended continuing amiodarone and as patient was not a candidate for heart cath due to her acute encephalopathy and respiratory distress on vent.  Acute hypoxemic respiratory failure secondary to cardiac arrest After resuscitation from cardiac arrest, patient required intubation for mechanical ventilation.  She continued to require full vent support with inability to tolerate weaning due to decreased respiratory drive in the setting of her acute encephalopathy.  Patient was put on fentanyl drip for pain control.  During the family meeting, sons informed team that patient never wanted to have been on life  support.  She also did not want to do dialysis but decided to do with so she can spend more time with her grandchildren. Family were already concerned about her previous poor quality of life before hospitalization. Due to her multiorgan failure, inability to return to baseline functional status and patient's previous wishes, family decided to withdraw care and allow patient to pass peacefully.  On 3/29, other family members arrived to see patient for the last time. Patient was terminally extubated at 10:03 AM and expired at 10:15 AM.  Left Pneumothorax Chest x-ray on 3/26 showed new moderate left pneumothorax and chest wall erythema with worsening left pulmonary aeration with probable pleural fluid. Chest tube was placed the morning of 3/27.  Patient received chest tube care and daily x-ray to monitor changes in pneumothorax and ensure chest tube remains in place.  Shock  Chronic hypotension Patient has baseline hypotension on midodrine at home which she takes during HD days.  Patient became more hypotensive after she was resuscitated.  While in the ICU, she required Levophed and vasopressin to maintain appropriate blood pressure and MAP >60-65.   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 S/p hemicolectomy in Feb. Patient started palliative chemotherapy with cycle #1 of FOLFOX with bevacizumab on 3/24. We held her home 5-FU infusion. Medical oncology saw patient on 3/28 and informed team that the nature of her cancer is incurable and the goal of chemotherapy is palliative.  Family informed team that patient was aware of this.     ESRD on HD: TTS Anemia of Chronic Disease Nephrology was consulted for HD. On 3/27, a temporary non-tunneled central venous catheter was placed and patient was started on CVVHD.  Patient's hemoglobin remained stable between 8.2-11.4 throughout hospitalization.   Type 2 diabetes  mellitus A1c during admission was 6.4. Patient's blood sugar was controlled with Lantus 20 units twice daily, sliding scale insulin and additional 4 units of Lantus every 4 hours for tube feed coverage.  At Risk Malnutrition  Registered dietitian was consulted and patient was started on tube feeds.  Electrolytes were monitored and her diet was supplemented with multivitamins.   Pertinent Labs and Studies  Significant Diagnostic Studies DG Chest 1 View  Result Date: 10/15/2020 CLINICAL DATA:  Central line placement EXAM: CHEST  1 VIEW COMPARISON:  Portable exam 1057 hours compared to 0516 hours FINDINGS: Tip of endotracheal tube projects 3.3 cm above carina. Nasogastric tube extends into abdomen. Two LEFT thoracostomy tubes present, pigtail new since prior study. RIGHT jugular Port-A-Cath with tip projecting over cavoatrial junction. New LEFT jugular central venous catheter with tip projecting over SVC. Tips of lung apices excluded. Normal heart size and mediastinal contours. Extensive chest wall pneumatosis projects over thorax bilaterally. Linear gas collection at medial inferior LEFT hemithorax likely medial LEFT pneumothorax. LEFT pneumothorax is significantly decreased following second chest tube insertion. Central peribronchial thickening and bibasilar atelectasis versus infiltrate unchanged. IMPRESSION: Decrease in LEFT pneumothorax following second thoracostomy tube insertion. New LEFT jugular line with tip projecting over SVC. Persistent bronchitic changes and bibasilar atelectasis versus consolidation. Extensive chest wall emphysema. Electronically Signed   By: Lavonia Dana M.D.   On: 10/15/2020 11:16   MR BRAIN WO CONTRAST  Result Date: 10/14/2020 CLINICAL DATA:  TIA EXAM: MRI HEAD WITHOUT CONTRAST TECHNIQUE: Multiplanar, multiecho pulse sequences of the brain and  surrounding structures were obtained without intravenous contrast. COMPARISON:  None. FINDINGS: Brain: Cortical/subcortical  reduced diffusion in the left parietal and occipital lobes. No evidence of hemorrhage. There is no intracranial mass or significant mass effect. Patchy foci of T2 hyperintensity in the supratentorial and pontine white matter are nonspecific may reflect mild to moderate chronic microvascular ischemic changes. Prominence of the ventricles and sulci reflects generalized parenchymal volume loss. Chronic parasagittal left occipital infarct. Small chronic right middle frontal gyrus infarct. There is no hydrocephalus or extra-axial fluid collection. Vascular: Major vessel flow voids at the skull base are preserved. Skull and upper cervical spine: Normal marrow signal is preserved. Sinuses/Orbits: Minor mucosal thickening.  Orbits are unremarkable. Other: Sella is unremarkable.  Mastoid air cells are clear. IMPRESSION: Acute cortical/subcortical infarcts of the left parietal and occipital lobes. No hemorrhage. Small chronic left occipital and right frontal infarcts. Chronic microvascular ischemic changes. Electronically Signed   By: Macy Mis M.D.   On: 10/14/2020 13:55   DG CHEST PORT 1 VIEW  Result Date: 10/16/2020 CLINICAL DATA:  Acute respiratory failure, hypoxia EXAM: PORTABLE CHEST 1 VIEW COMPARISON:  10/15/2020 FINDINGS: Endotracheal tube is seen 2.4 cm above the carina. Nasogastric tube extends into the upper abdomen. Left internal jugular hemodialysis catheter tip is unchanged when accounting for changes in patient positioning with its tip overlying the brachiocephalic vein. Right internal jugular chest port tip noted within the right atrium. Left apical pigtail chest tube is unchanged. Additional left mid lung zone large bore chest tube is again noted. There is increasing lucency involving the left hemithorax which may relate to increasing left basilar collapse and compensatory hyperinflation of the left upper lobe. An anterior, loculated pneumothorax could result in a similar appearance, but is  considered less likely. No definite pneumothorax is identified. There is increasing bibasilar atelectasis or infiltrate. Cardiac size within normal limits. Extensive gas within the left chest wall again identified. IMPRESSION: Stable support lines and tubes. Increasing lucency of the left hemithorax likely related to hyperinflation of the left upper lobe in the setting of increasing left basilar atelectasis or infiltrate. While and anteriorly loculated pneumothorax could result in a similar appearance, this is considered less likely. Progressive bibasilar atelectasis or infiltrate. Electronically Signed   By: Fidela Salisbury MD   On: 10/16/2020 07:16   DG CHEST PORT 1 VIEW  Result Date: 10/15/2020 CLINICAL DATA:  Acute respiratory failure with hypoxia EXAM: PORTABLE CHEST 1 VIEW COMPARISON:  10/14/2020 FINDINGS: Indwelling left chest tube. Moderate left pneumothorax, progressive. Extensive subcutaneous emphysema predominantly over the left chest wall, progressive. Patchy bilateral lower lobe opacities, likely atelectasis. No pleural effusions. Endotracheal tube terminates 4.8 cm above the carina. Right chest power port terminates the cavoatrial junction. Enteric tube courses into the stomach. The heart is normal in size. IMPRESSION: Moderate left pneumothorax, progressive. Indwelling left chest tube. Extensive subcutaneous emphysema, progressive. These results will be called to the ordering clinician or representative by the Radiologist Assistant, and communication documented in the PACS or [employer]. Electronically Signed   By: Julian Hy M.D.   On: 10/15/2020 05:43   DG CHEST PORT 1 VIEW  Result Date: 10/14/2020 CLINICAL DATA:  Chest tube placement EXAM: PORTABLE CHEST 1 VIEW COMPARISON:  10/14/2020 at 0417 hours FINDINGS: Indwelling left chest tube. A persistent trace left pneumothorax may be obscured by the overlying defibrillator pad. Right lung is essentially clear.  No definite pleural  effusion. Endotracheal tube terminates 10 mm above the carina. Enteric tube courses into the  proximal stomach. Right chest power port terminates at the cavoatrial junction. Mild cardiomegaly. IMPRESSION: Indwelling left chest tube. A persistent trace left pneumothorax may be obscured by the overlying defibrillator pad. Endotracheal tube terminates 10 mm above the carina. Additional support apparatus as above. Electronically Signed   By: Julian Hy M.D.   On: 10/14/2020 06:18   DG Chest Port 1 View  Result Date: 10/14/2020 CLINICAL DATA:  Acute respiratory failure EXAM: PORTABLE CHEST 1 VIEW COMPARISON:  Yesterday FINDINGS: Endotracheal tube with tip 7 mm above the carina. The enteric tube tip is at the distal stomach or proximal duodenum. Right port with tip at the upper cavoatrial junction. Moderate left pneumothorax which could be up to 50%. There is superimposed pulmonary and pleural density. New chest wall emphysema. Critical Value/emergent results were called by telephone at the time of interpretation on 10/14/2020 at 4:49 am to provider Trevor Mace , who verbally acknowledged these results. IMPRESSION: 1. New moderate left pneumothorax and chest wall emphysema. 2. Worsening left pulmonary aeration with probable pleural fluid. 3. Low endotracheal tube with tip 7 mm above the carina. The enteric tube tip at least reaches the pylorus. Electronically Signed   By: Monte Fantasia M.D.   On: 10/14/2020 04:50   DG CHEST PORT 1 VIEW  Result Date: 10/15/2020 CLINICAL DATA:  Respiratory failure, history of colon cancer EXAM: PORTABLE CHEST 1 VIEW COMPARISON:  10/11/2020 at 1:45 p.m. FINDINGS: Single frontal view of the chest demonstrates endotracheal tube overlying tracheal air column, tip approximately 1.5 cm above carina. External defibrillator pads overlie the cardiac silhouette. Right chest wall port via internal jugular approach unchanged. Cardiac silhouette is stable. No airspace disease,  effusion, or pneumothorax. IMPRESSION: 1. No complication after intubation. No acute intrathoracic process. Electronically Signed   By: Randa Ngo M.D.   On: 09/25/2020 22:15   DG Chest Port 1 View  Result Date: 10/14/2020 CLINICAL DATA:  Weakness.  Code stroke. EXAM: PORTABLE CHEST 1 VIEW COMPARISON:  11/28/2012 FINDINGS: Aortic atherosclerosis. There is a right chest wall port a catheter with tip in the right atrium. No pleural effusion or edema. No airspace densities. The visualized osseous structures are unremarkable. IMPRESSION: No acute cardiopulmonary abnormalities. Electronically Signed   By: Kerby Moors M.D.   On: 09/30/2020 14:19   DG Abd Portable 1V  Result Date: 10/19/2020 CLINICAL DATA:  Check gastric catheter placement EXAM: PORTABLE ABDOMEN - 1 VIEW COMPARISON:  None. FINDINGS: Gastric catheter is noted within the distal stomach. No free air is seen. No other focal abnormality is noted. IMPRESSION: Gastric catheter within the stomach. Electronically Signed   By: Inez Catalina M.D.   On: 10/03/2020 23:35   EEG adult  Result Date: 10/15/2020 Raenette Rover, MD     10/18/2020  7:28 PM EEG Report Indication: possible seizure activity This study was recorded in the waiting/drowsy state.  The duration of the study was 24 minutes.  Electrodes were placed according to the International 10/20 system.  Video was reviewed/available for clinical correlation as needed. In the waking state, discernible but diminished background organization is seen with a severely attenuated anterior - posterior voltage and frequency gradient.  In the occipital leads there was a symmetric and reactive although indistinct and poorly sustained posterior dominant rhythm of approximately 6 hertz, which is slower than expected for age  Anteriorly, is the expected pattern of faster frequency, lower voltage waveforms, although in a reduced amount as compared to normal. In addition, there was diffusely seen  intermixed  low - moderate amplitude Delta and theta slowing with overlying faster frequencies. During the drowsy state, there is further attenuation of the gradient, a general shift to slower frequencies diffusely, a waxing and waning of the posterior dominant rhythm, and slow roving eye movements. Hyperventilation: deferred Photic stimulation: deferred There are no clear focal, paroxysmal or epileptiform abnormalities  or interhemispheric asymmetries. Impression: This is an abnormal waking and drowsy study due to markedly diminished background organization as described above, most clinically compatible with a mild-moderate diffuse encephalopathy.  There are no clear focal or epileptiform abnormalities.   CT HEAD CODE STROKE WO CONTRAST  Result Date: 09/29/2020 CLINICAL DATA:  Code stroke. Neuro deficit, acute, stroke suspected. Additional history provided: Confusion, last known well 10 p.m. EXAM: CT HEAD WITHOUT CONTRAST TECHNIQUE: Contiguous axial images were obtained from the base of the skull through the vertex without intravenous contrast. COMPARISON:  No pertinent prior exams available for comparison. FINDINGS: Brain: Mild cerebral and cerebellar atrophy. Small chronic appearing cortical infarct within the medial left occipital lobe (for instance as seen on series 3, image 12). Mild patchy and ill-defined hypoattenuation within the cerebral white matter is nonspecific, but compatible chronic small vessel ischemic disease. There is no acute intracranial hemorrhage. No demarcated cortical infarct. No extra-axial fluid collection. No evidence of intracranial mass. No midline shift. Vascular: No hyperdense vessel.  Atherosclerotic calcifications. Skull: Normal. Negative for fracture or focal lesion. Sinuses/Orbits: Visualized orbits show no acute finding. No significant paranasal sinus disease. ASPECTS ([place] Stroke Program Early CT Score) - Ganglionic level infarction (caudate, lentiform nuclei, internal capsule,  insula, M1-M3 cortex): 7 - Supraganglionic infarction (M4-M6 cortex): 3 Total score (0-10 with 10 being normal): 10 These results were called by telephone at the time of interpretation on 10/10/2020 at 1:47 pm to provider Dr, Leonel Ramsay, who verbally acknowledged these results. IMPRESSION: No evidence of acute intracranial abnormality.  ASPECTS is 10. 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 Mild generalized parenchymal atrophy and cerebral white matter chronic small vessel ischemic disease. Electronically Signed   By: Kellie Simmering DO   On: 10/09/2020 13:51   IR IMAGING GUIDED PORT INSERTION  Result Date: 10/04/2020 INDICATION: History of colon cancer. In need of durable intravenous access for chemotherapy administration. EXAM: IMPLANTED PORT A CATH PLACEMENT WITH ULTRASOUND AND FLUOROSCOPIC GUIDANCE COMPARISON:  None. MEDICATIONS: None ANESTHESIA/SEDATION: Moderate (conscious) sedation was employed during this procedure. A total of Versed 1.5 mg and Fentanyl 50 mcg was administered intravenously. Moderate Sedation Time: 25 minutes. The patient's level of consciousness and vital signs were monitored continuously by radiology nursing throughout the procedure under my direct supervision. CONTRAST:  None FLUOROSCOPY TIME:  24 seconds (9 mGy) COMPLICATIONS: None immediate. PROCEDURE: The procedure, risks, benefits, and alternatives were explained to the patient. Questions regarding the procedure were encouraged and answered. The patient understands and consents to the procedure. The right neck and chest were prepped with chlorhexidine in a sterile fashion, and a sterile drape was applied covering the operative field. Maximum barrier sterile technique with sterile gowns and gloves were used for the procedure. A timeout was performed prior to the initiation of the procedure. Local anesthesia was provided with 1% lidocaine with epinephrine. After creating a small venotomy incision, a micropuncture  kit was utilized to access the internal jugular vein. Real-time ultrasound guidance was utilized for vascular access including the acquisition of a permanent ultrasound image documenting patency of the accessed vessel. The microwire was utilized to measure appropriate catheter length. A  subcutaneous port pocket was then created along the upper chest wall utilizing a combination of sharp and blunt dissection. The pocket was irrigated with sterile saline. A single lumen "Slim" sized power injectable port was chosen for placement. The 8 Fr catheter was tunneled from the port pocket site to the venotomy incision. The port was placed in the pocket. The external catheter was trimmed to appropriate length. At the venotomy, an 8 Fr peel-away sheath was placed over a guidewire under fluoroscopic guidance. The catheter was then placed through the sheath and the sheath was removed. Final catheter positioning was confirmed and documented with a fluoroscopic spot radiograph. The port was accessed with a Huber needle, aspirated and flushed with heparinized saline. The venotomy site was closed with an interrupted 4-0 Vicryl suture. The port pocket incision was closed with interrupted 2-0 Vicryl suture. The skin was opposed with a running subcuticular 4-0 Vicryl suture. Dermabond and Steri-strips were applied to both incisions. Dressings were applied. The patient tolerated the procedure well without immediate post procedural complication. FINDINGS: After catheter placement, the tip lies within the superior cavoatrial junction. The catheter aspirates and flushes normally and is ready for immediate use. IMPRESSION: Successful placement of a right internal jugular approach power injectable Port-A-Cath. The catheter is ready for immediate use. Electronically Signed   By: Sandi Mariscal M.D.   On: 10/04/2020 14:08   VAS US CAROTID  Result Date: 10/16/2020 Carotid Arterial Duplex Study Indications:       CVA. Risk Factors:       Hypertension, hyperlipidemia, Diabetes, coronary artery                    disease. Limitations        Today's exam was limited due to patient on a ventilator and                    patient positioning. Comparison Study:  07/13/18 previous Performing Technologist: Abram Sander RVS  Examination Guidelines: A complete evaluation includes B-mode imaging, spectral Doppler, color Doppler, and power Doppler as needed of all accessible portions of each vessel. Bilateral testing is considered an integral part of a complete examination. Limited examinations for reoccurring indications may be performed as noted.  Right Carotid Findings: +----------+--------+--------+--------+------------------+---------------------+           PSV cm/sEDV cm/sStenosisPlaque DescriptionComments              +----------+--------+--------+--------+------------------+---------------------+ CCA Prox  111     36              heterogenous                            +----------+--------+--------+--------+------------------+---------------------+ CCA Distal68      23              heterogenous                            +----------+--------+--------+--------+------------------+---------------------+ ICA Prox  67      27      1-39%   heterogenous      limited visualization                                                     due to patient  positioning           +----------+--------+--------+--------+------------------+---------------------+ ICA Mid                                             Not visualized        +----------+--------+--------+--------+------------------+---------------------+ ECA       88      14                                                      +----------+--------+--------+--------+------------------+---------------------+ +----------+--------+-------+--------+-------------------+           PSV cm/sEDV  cmsDescribeArm Pressure (mmHG) +----------+--------+-------+--------+-------------------+ [id number]                                         +----------+--------+-------+--------+-------------------+ +---------+--------+--------+--------------+ VertebralPSV cm/sEDV cm/sNot identified +---------+--------+--------+--------------+  Left Carotid Findings: +----------+--------+--------+--------+------------------+--------+           PSV cm/sEDV cm/sStenosisPlaque DescriptionComments +----------+--------+--------+--------+------------------+--------+ CCA Prox  103     33              heterogenous               +----------+--------+--------+--------+------------------+--------+ CCA Distal82      33              heterogenous               +----------+--------+--------+--------+------------------+--------+ ICA Prox  84      32      1-39%   heterogenous               +----------+--------+--------+--------+------------------+--------+ ICA Distal95      43                                         +----------+--------+--------+--------+------------------+--------+ ECA       71                                                 +----------+--------+--------+--------+------------------+--------+ +----------+--------+--------+--------+-------------------+           PSV cm/sEDV cm/sDescribeArm Pressure (mmHG) +----------+--------+--------+--------+-------------------+ [id number]                                          +----------+--------+--------+--------+-------------------+ +---------+--------+--+--------+--+---------+ VertebralPSV cm/s78EDV cm/s36Antegrade +---------+--------+--+--------+--+---------+   Summary: Right Carotid: Velocities in the right ICA are consistent with a 1-39% stenosis. Left Carotid: Velocities in the left ICA are consistent with a 1-39% stenosis. Vertebrals: Bilateral vertebral arteries demonstrate antegrade flow. *See table(s)  above for measurements and observations.  Electronically signed by Antony Contras MD on 10/16/2020 at 6:12:25 PM.    Final    ECHOCARDIOGRAM LIMITED  Result Date: 10/14/2020    ECHOCARDIOGRAM REPORT   Patient Name:   [name] Date of Exam: 10/14/2020 Medical Rec #:  [id number]     Height:  67.0 in Accession #:    [id number]    Weight:       195.3 lb Date of Birth:  02-07-1951     BSA:          2.002 m Patient Age:    56 years      BP:           120/70 mmHg Patient Gender: F             HR:           81 bpm. Exam Location:  Inpatient Procedure: Limited Echo, Color Doppler and Cardiac Doppler Indications:    stroke  History:        Patient has prior history of Echocardiogram examinations, most                 recent 03/02/2018. Cancer. end stage renal disease.,                 Arrythmias:Ventricular Fibrillation; Risk Factors:Hypertension.  Sonographer:    Johny Chess Referring Phys: [id number] [facility]  Sonographer Comments: Technically difficult study due to poor echo windows, Technically challenging study due to limited acoustic windows, suboptimal parasternal window and echo performed with patient supine and on artificial respirator. Image acquisition challenging due to patient body habitus and Image acquisition challenging due to respiratory motion. IMPRESSIONS  1. Extremely limited; LV function ? preserved; no pericardial effusion; no other information could be obtained with this study; suggest TEE if clinically indicated.  2. Left ventricular ejection fraction, by estimation, is Cannot be assessed%. The left ventricle has Cannot be assessed function. Left ventricular endocardial border not optimally defined to evaluate regional wall motion. The left ventricular internal cavity size was Not assessed. There is Not assessed left ventricular hypertrophy. Left ventricular diastolic function could not be evaluated.  3. Right ventricular systolic function is normal. The right ventricular size is normal.   4. The mitral valve was not assessed. Not fully assessed mitral valve regurgitation.  5. Tricuspid valve regurgitation Not fully assessed.  6. The aortic valve was not assessed. Aortic valve regurgitation Not assessed.  7. Pulmonic valve regurgitation Not assessed. FINDINGS  Left Ventricle: Left ventricular ejection fraction, by estimation, is Cannot be assessed%. The left ventricle has Cannot be assessed function. Left ventricular endocardial border not optimally defined to evaluate regional wall motion. The left ventricular internal cavity size was Not assessed. There is Not assessed left ventricular hypertrophy. Left ventricular diastolic function could not be evaluated. Right Ventricle: The right ventricular size is normal. Right vetricular wall thickness was not assessed. Right ventricular systolic function is normal. Left Atrium: Left atrial size was not assessed. Right Atrium: Right atrial size was not assessed. Pericardium: There is no evidence of pericardial effusion. Mitral Valve: The mitral valve was not assessed. Not fully assessed mitral valve regurgitation. Tricuspid Valve: The tricuspid valve is not assessed. Tricuspid valve regurgitation Not fully assessed. No evidence of tricuspid stenosis. Aortic Valve: The aortic valve was not assessed. Aortic valve regurgitation Not assessed. Pulmonic Valve: The pulmonic valve was not assessed. Pulmonic valve regurgitation Not assessed. Aorta: Aortic root could not be assessed. Venous: The inferior vena cava was not well visualized.  Additional Comments: Extremely limited; LV function ? preserved; no pericardial effusion; no other information could be obtained with this study; suggest TEE if clinically indicated.  MV E velocity: 54.40 cm/s MV A velocity: 83.10 cm/s MV E/A ratio:  0.65 Kirk Ruths MD Electronically signed by Kirk Ruths MD Signature  Date/Time: 10/14/2020/2:08:03 PM    Final    CT ANGIO HEAD NECK W WO CM W PERFUSION  Addendum Date:  10/11/2020   ADDENDUM REPORT: 10/07/2020 16:01 ADDENDUM: The CT perfusion was performed. However, the CT perfusion was stopped due to patient motion and restarted. The quality of the data is suspect. Based on rapid analysis, no evidence of acute infarct or ischemia. Electronically Signed   By: Franchot Gallo M.D.   On: 10/10/2020 16:01   Result Date: 09/28/2020 CLINICAL DATA:  Focal neuro deficit greater than 6 hours. Suspect stroke. Confusion EXAM: CT ANGIOGRAPHY HEAD AND NECK CT PERFUSION HEAD TECHNIQUE: Multidetector CT imaging of the head and neck was performed using the standard protocol during bolus administration of intravenous contrast. Multiplanar CT image reconstructions and MIPs were obtained to evaluate the vascular anatomy. Carotid stenosis measurements (when applicable) are obtained utilizing NASCET criteria, using the distal internal carotid diameter as the denominator. CONTRAST:  113mL OMNIPAQUE IOHEXOL 350 MG/ML SOLN COMPARISON:  CT head 10/10/2020 FINDINGS: CT HEAD FINDINGS Following the CT angiogram: Delayed imaging of the brain obtained. No enhancing lesion identified. Patchy white matter hypodensity again noted. 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 CTA NECK FINDINGS Aortic arch: Standard branching. Imaged portion shows no evidence of aneurysm or dissection. No significant stenosis of the major arch vessel origins. Atherosclerotic calcification aortic arch and proximal great vessels. Right carotid system: Atherosclerotic calcification in the right common carotid artery without stenosis. Atherosclerotic calcification right carotid bifurcation without significant stenosis. Left carotid system: Atherosclerotic calcification left common carotid artery without stenosis. Atherosclerotic calcification left carotid bifurcation. Approximately 25% diameter stenosis left internal carotid artery. Vertebral arteries: Moderate calcific stenosis origin of right vertebral  artery without additional stenosis and patent to the basilar. Mild calcific stenosis origin of left vertebral artery without additional stenosis. Skeleton: Disc and facet degeneration throughout the cervical and thoracic spine. No acute skeletal abnormality. Other neck: Subcentimeter thyroid nodules bilaterally. No further imaging necessary. Upper chest: Lung apices clear bilaterally. 22 mm right lower paratracheal lymph node. Review of the MIP images confirms the above findings CTA HEAD FINDINGS Anterior circulation: Atherosclerotic calcification without stenosis in the cavernous carotid bilaterally. Anterior middle cerebral arteries widely patent without large vessel occlusion. Posterior circulation: Posterior circulation widely patent without stenosis or large vessel occlusion. Venous sinuses: Normal venous enhancement. Anatomic variants: None Review of the MIP images confirms the above findings IMPRESSION: 1. Postcontrast imaging of the brain reveals no enhancing mass lesion. No acute infarct. 2. Atherosclerotic calcification aortic arch and proximal great vessels and carotid artery bilaterally. 3. Atherosclerotic calcification right carotid bifurcation without significant stenosis. Less than 25% diameter stenosis proximal left internal carotid artery 4. Atherosclerotic calcification and stenosis at the origin of the vertebral artery bilaterally right greater than left. 5. No intracranial large vessel occlusion 6. 22 mm right lower paratracheal lymph node. 7. CT perfusion images pending at this time. Electronically Signed: By: Franchot Gallo M.D. On: 09/21/2020 15:18    Microbiology Recent Results (from the past 240 hour(s))  Resp Panel by RT-PCR (Flu A&B, Covid) Nasopharyngeal Swab     Status: None   Collection Time: 10/16/2020  1:37 PM   Specimen: Nasopharyngeal Swab; Nasopharyngeal(NP) swabs in vial transport medium  Result Value Ref Range Status   SARS Coronavirus 2 by RT PCR NEGATIVE NEGATIVE Final     Comment: (NOTE) SARS-CoV-2 target nucleic acids are NOT DETECTED.  The SARS-CoV-2 RNA is generally detectable in upper respiratory specimens during  the acute phase of infection. The lowest concentration of SARS-CoV-2 viral copies this assay can detect is 138 copies/mL. A negative result does not preclude SARS-Cov-2 infection and should not be used as the sole basis for treatment or other patient management decisions. A negative result may occur with  improper specimen collection/handling, submission of specimen other than nasopharyngeal swab, presence of viral mutation(s) within the areas targeted by this assay, and inadequate number of viral copies(<138 copies/mL). A negative result must be combined with clinical observations, patient history, and epidemiological information. The expected result is Negative.  Fact Sheet for Patients:  EntrepreneurPulse.com.au  Fact Sheet for Healthcare Providers:  IncredibleEmployment.be  This test is no t yet approved or cleared by the [place] FDA and  has been authorized for detection and/or diagnosis of SARS-CoV-2 by FDA under an Emergency Use Authorization (EUA). This EUA will remain  in effect (meaning this test can be used) for the duration of the COVID-19 declaration under Section 564(b)(1) of the Act, 21 U.S.C.section 360bbb-3(b)(1), unless the authorization is terminated  or revoked sooner.       Influenza A by PCR NEGATIVE NEGATIVE Final   Influenza B by PCR NEGATIVE NEGATIVE Final    Comment: (NOTE) The Xpert Xpress SARS-CoV-2/FLU/RSV plus assay is intended as an aid in the diagnosis of influenza from Nasopharyngeal swab specimens and should not be used as a sole basis for treatment. Nasal washings and aspirates are unacceptable for Xpert Xpress SARS-CoV-2/FLU/RSV testing.  Fact Sheet for Patients: EntrepreneurPulse.com.au  Fact Sheet for Healthcare  Providers: IncredibleEmployment.be  This test is not yet approved or cleared by the [place] FDA and has been authorized for detection and/or diagnosis of SARS-CoV-2 by FDA under an Emergency Use Authorization (EUA). This EUA will remain in effect (meaning this test can be used) for the duration of the COVID-19 declaration under Section 564(b)(1) of the Act, 21 U.S.C. section 360bbb-3(b)(1), unless the authorization is terminated or revoked.  Performed at [facility], [place] [address], [place], [place] [address]   MRSA PCR Screening     Status: None   Collection Time: 09/24/2020 11:08 PM   Specimen: Nasopharyngeal  Result Value Ref Range Status   MRSA by PCR NEGATIVE NEGATIVE Final    Comment:        The GeneXpert MRSA Assay (FDA approved for NASAL specimens only), is one component of a comprehensive MRSA colonization surveillance program. It is not intended to diagnose MRSA infection nor to guide or monitor treatment for MRSA infections. Performed at [facility], [place] [address], [place], [place] [address]   Culture, blood (Routine X 2) w Reflex to ID Panel     Status: None (Preliminary result)   Collection Time: 10/15/20 11:36 PM   Specimen: BLOOD RIGHT HAND  Result Value Ref Range Status   Specimen Description BLOOD RIGHT HAND  Final   Special Requests   Final    BOTTLES DRAWN AEROBIC ONLY Blood Culture results may not be optimal due to an inadequate volume of blood received in culture bottles   Culture   Final    NO GROWTH 2 DAYS Performed at [facility], [place] [address] [place], [place] [address]    Report Status PENDING  Incomplete  Culture, blood (Routine X 2) w Reflex to ID Panel     Status: None (Preliminary result)   Collection Time: 10/15/20 11:36 PM   Specimen: BLOOD  Result Value Ref Range Status   Specimen Description  BLOOD RIGHT ANTECUBITAL  Final   Special Requests   Final    BOTTLES DRAWN AEROBIC  ONLY Blood Culture results may not be optimal due to an inadequate volume of blood received in culture bottles   Culture   Final    NO GROWTH 2 DAYS Performed at [facility], [place] [address], [place], [place] [address]    Report Status PENDING  Incomplete    Lab Basic Metabolic Panel: Recent Labs  Lab 10/14/20 0400 10/14/20 1124 10/14/20 2045 10/15/20 0325 10/15/20 1151 10/15/20 1155 10/15/20 1415 10/16/20 0410  NA 137  --   --  134* 135 136 137 136  K 4.9  --   --  5.4* 5.6* 5.7* 5.2* 4.5  CL 98  --   --  96*  --  96* 98 99  CO2 24  --   --  22  --  26 27 25   GLUCOSE 265*  --   --  324*  --  295* 299* 169*  BUN 26*  --   --  37*  --  46* 44* 33*  CREATININE 5.82*  --   --  6.62*  --  7.39* 6.80* 4.17*  CALCIUM 8.9  --   --  8.1*  --  8.2* 8.1* 8.4*  MG  --  2.0 2.0 1.9  --   --  2.5* 2.5*  PHOS  --  8.2* 8.7* 8.4*  --   --  7.3*  7.6* 5.0*   Liver Function Tests: Recent Labs  Lab 10/11/20 1506 10/04/2020 1347 09/29/2020 2153 10/15/20 1415 10/16/20 0410  AST 15 16 91*  --   --   ALT 11 10 61*  --   --   ALKPHOS 66 52 53  --   --   BILITOT 0.4 0.3 0.8  --   --   PROT 7.2 6.5 5.9*  --   --   ALBUMIN 3.3* 3.3* 3.0* 2.5* 2.6*   No results for input(s): LIPASE, AMYLASE in the last 168 hours. Recent Labs  Lab 09/22/2020 1720  AMMONIA 31   CBC: Recent Labs  Lab 10/11/20 1506 10/15/2020 1347 09/20/2020 1403 10/08/2020 2153 10/02/2020 2218 10/14/20 0400 10/15/20 0325 10/15/20 1151 10/16/20 0410  WBC 6.6 7.7  --  12.1*  --  15.2* 12.6*  --  7.5  NEUTROABS 4.6 5.8  --   --   --   --   --   --   --   HGB 9.9* 9.2*   < > 11.4* 9.2* 9.9* 9.9* 8.8* 8.8*  HCT 29.7* 27.9*   < > 33.9* 27.0* 29.6* 30.0* 26.0* 26.5*  MCV 99.3 100.0  --  100.3*  --  99.3 99.0  --  100.4*  PLT 162 148*  --  169  --  259 173  --  100*   < > = values in this interval not displayed.   Cardiac Enzymes: No results for input(s): CKTOTAL, CKMB, CKMBINDEX, TROPONINI in the last 168 hours. Sepsis  Labs: Recent Labs  Lab 09/20/2020 2153 10/14/20 0230 10/14/20 0400 10/15/20 0325 10/15/20 0559 10/16/20 0410  WBC 12.1*  --  15.2* 12.6*  --  7.5  LATICACIDVEN 9.5* 3.1*  --   --  2.4*  --     Procedures/Operations  Intubated after CPR on 2/25 Chest tube placed on 3/27    Lacinda Axon 11/06/2020, 1:26 PM

## 2020-10-20 NOTE — Progress Notes (Signed)
Patient extubated at 58 and expired at 17, MD aware and [place] Donnor called. Eye prep done

## 2020-10-20 DEATH — deceased

## 2020-10-21 MED FILL — Medication: Qty: 1 | Status: AC

## 2020-10-25 ENCOUNTER — Other Ambulatory Visit: Payer: Medicare Other

## 2020-10-25 ENCOUNTER — Ambulatory Visit: Payer: Medicare Other | Admitting: Physician Assistant

## 2020-10-25 LAB — VITAMIN B1: Vitamin B1 (Thiamine): 183.2 nmol/L (ref 66.5–200.0)

## 2020-10-26 ENCOUNTER — Ambulatory Visit: Payer: Medicare Other

## 2021-02-12 ENCOUNTER — Ambulatory Visit: Payer: Medicare Other | Admitting: Family Medicine

## 2022-06-10 IMAGING — DX DG FOOT COMPLETE 3+V*L*
3 series · 3 of 3 positions shown · non-contrast
Comparison: 04/05/2020

CLINICAL DATA: Nonhealing foot wounds

EXAM:
LEFT FOOT - COMPLETE 3+ VIEW

[foot ap]
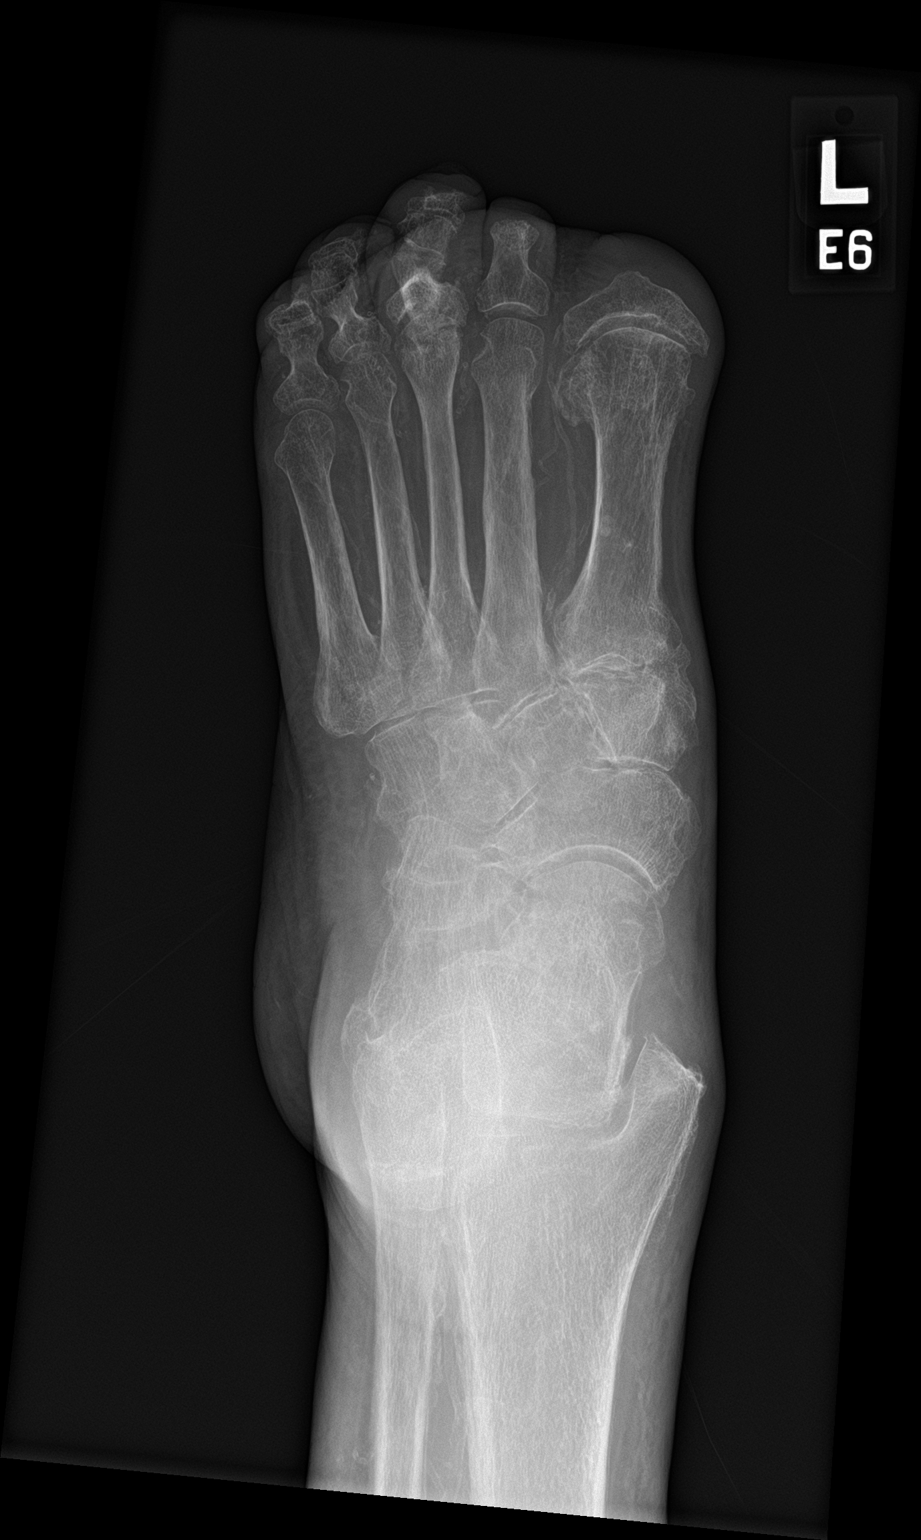

[foot obl]
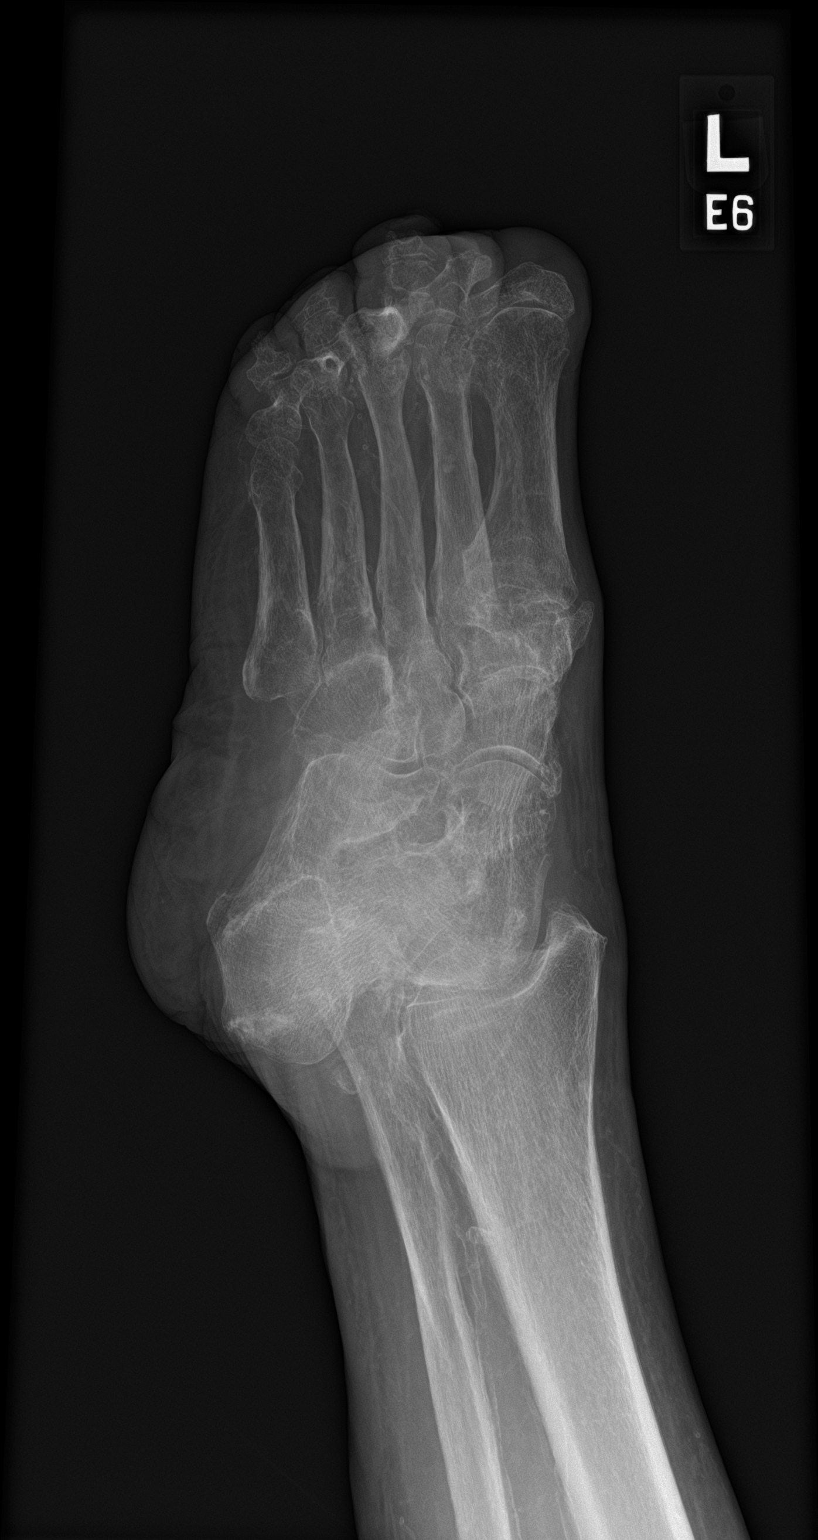

[foot lat]
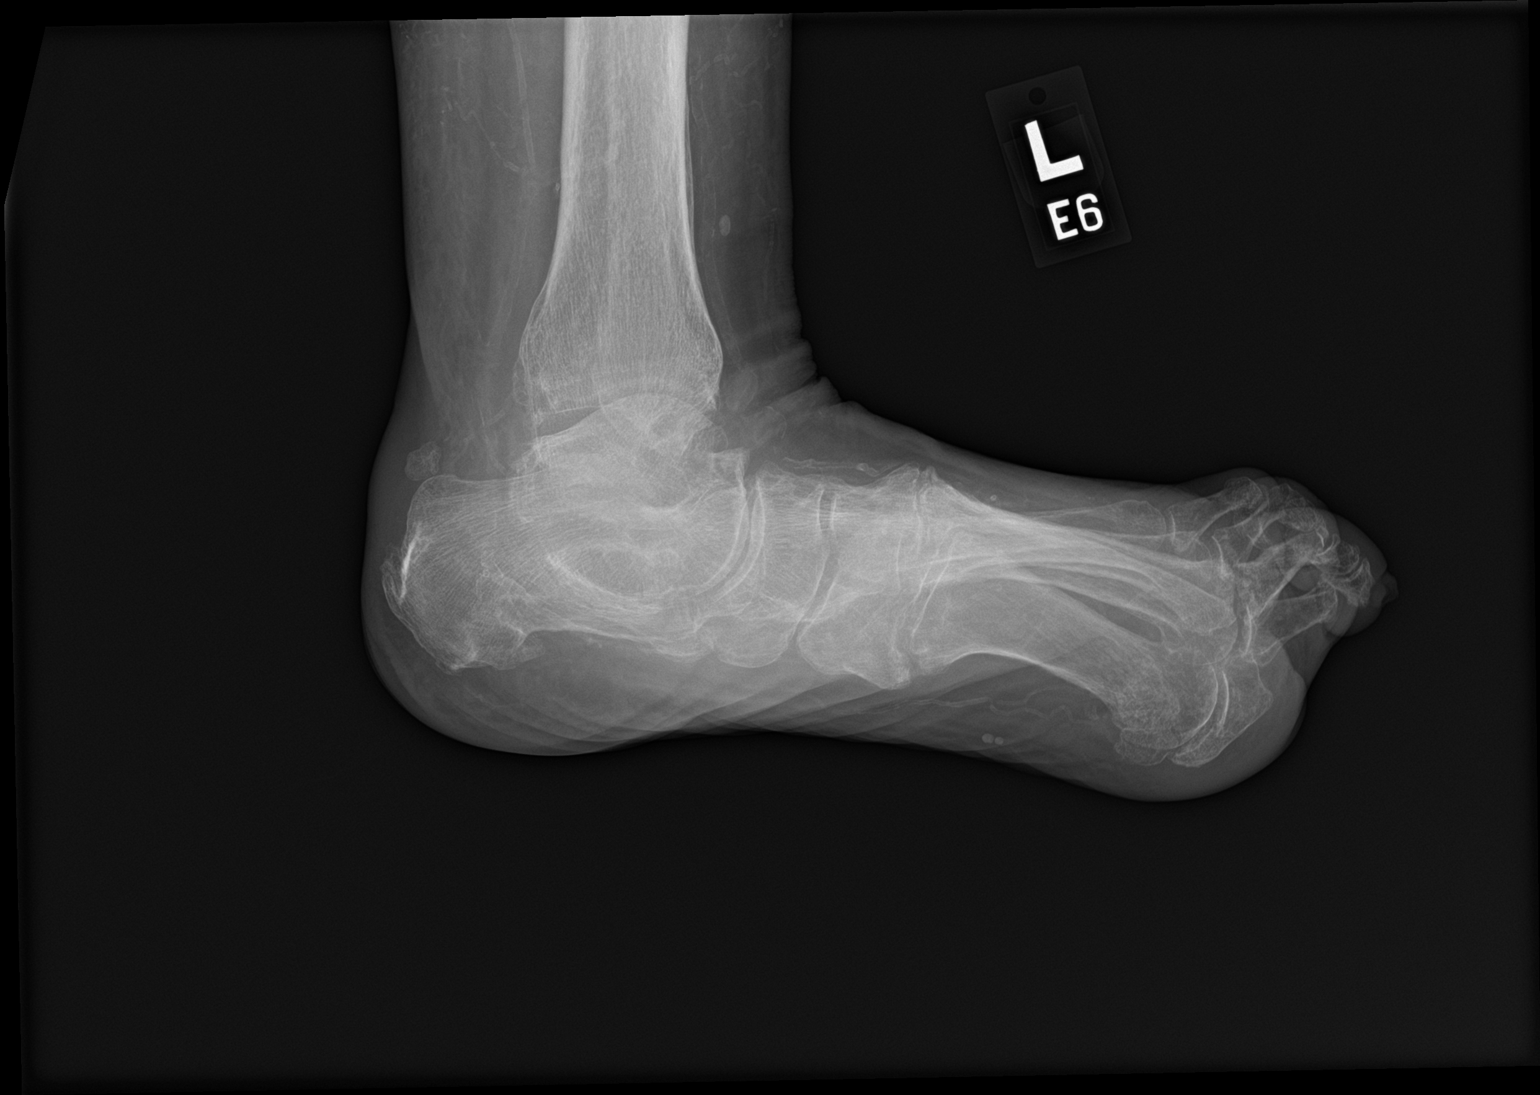

[3 of 3 positions shown; findings below may reference images not displayed]

FINDINGS: Changes consistent with prior amputation of the first and second toe
stable in appearance from the prior exam. Flattening of the plantar
arch is noted. Tarsal degenerative changes are seen. Calcaneal
spurring is noted. Chronic regularity at the third MTP joint is
noted stable from the prior exam. No bony erosive changes are
identified to suggest osteomyelitis.
IMPRESSION: Postsurgical changes. No erosive changes to suggest osteomyelitis
are seen.

## 2022-06-10 IMAGING — DX DG FOOT COMPLETE 3+V*R*
3 series · 3 of 3 positions shown · non-contrast
Comparison: 11/15/2019

CLINICAL DATA: Foot pain and nonhealing wounds

EXAM:
RIGHT FOOT COMPLETE - 3+ VIEW

[foot ap]
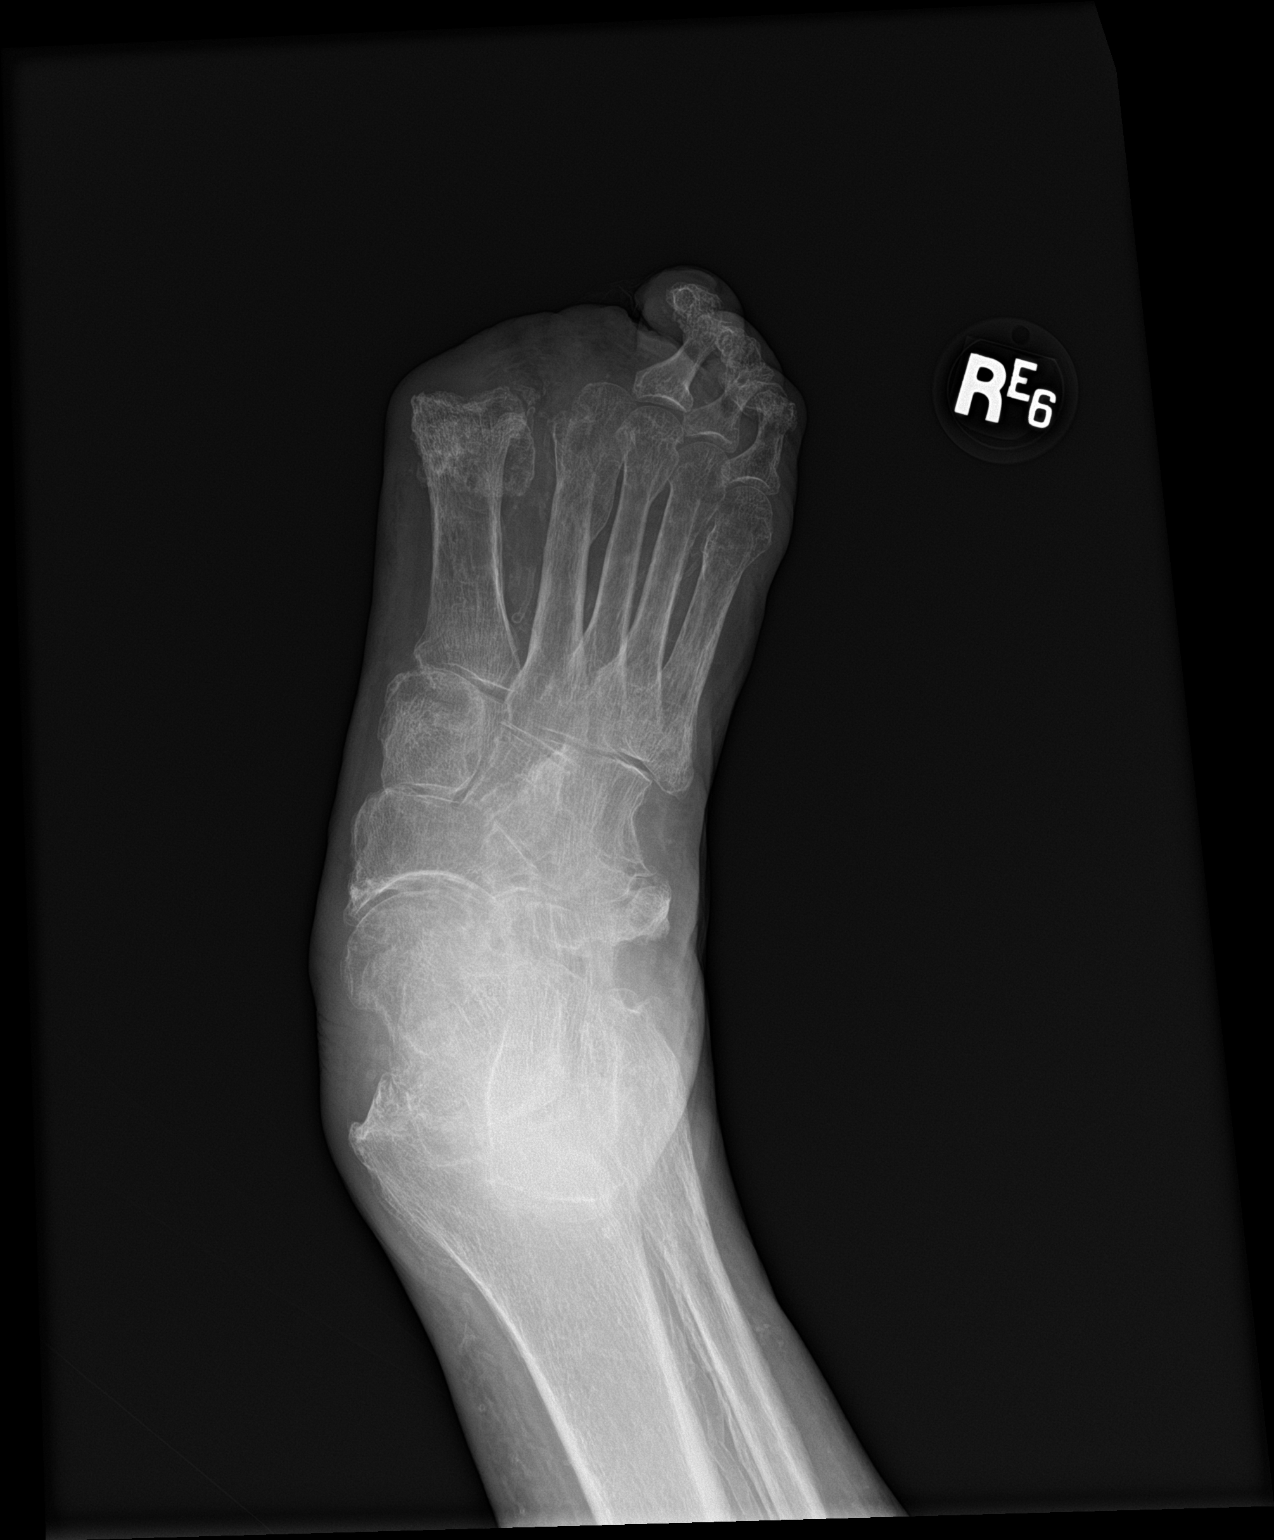

[foot obl]
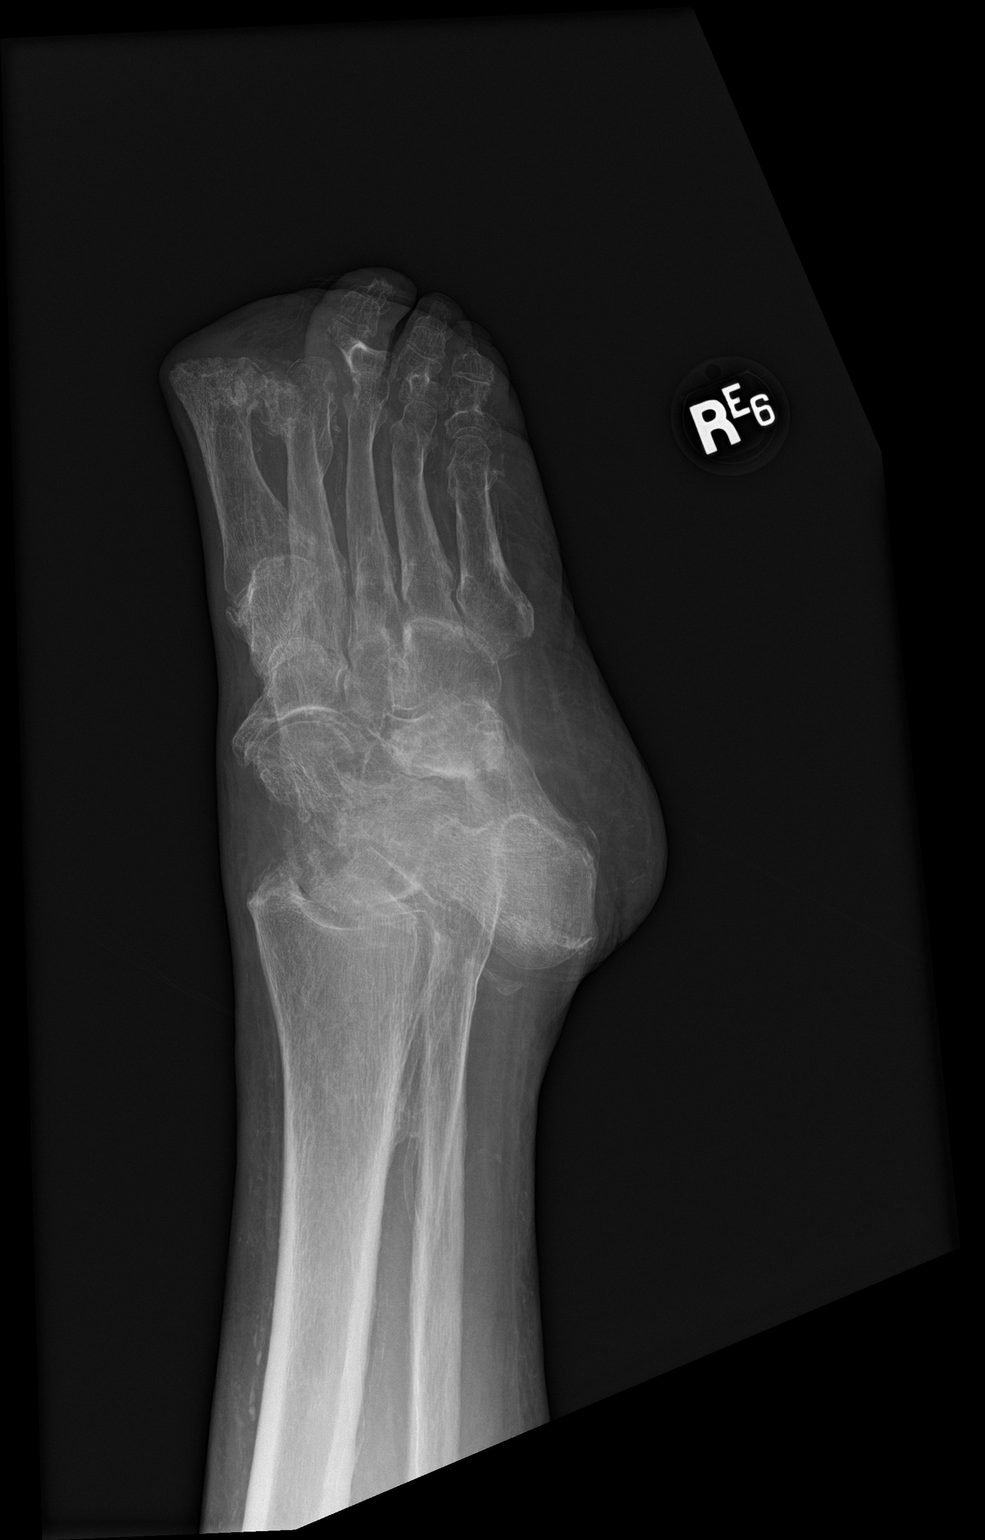

[foot lat]
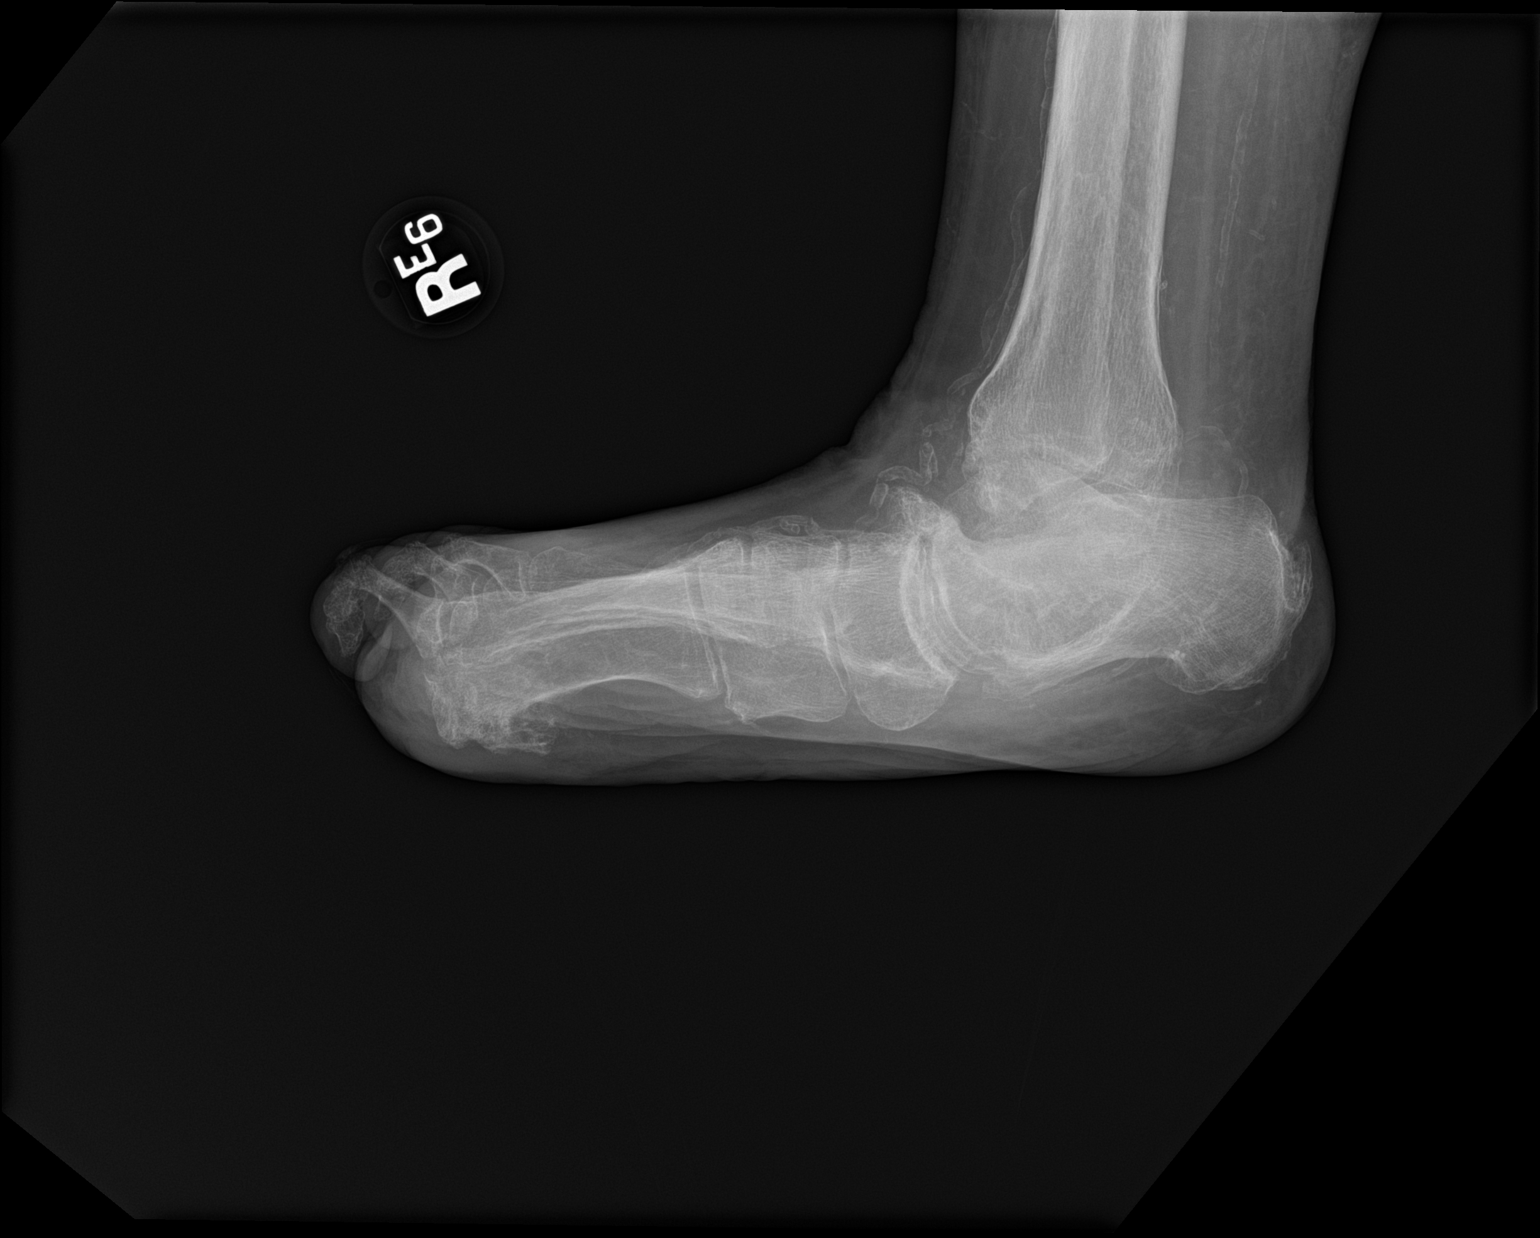

[3 of 3 positions shown; findings below may reference images not displayed]

FINDINGS: Changes of amputation of the first and second digits are again seen
and stable. Healed fracture of the second metatarsal distally is
seen. Generalized osteopenia is noted. Flattening of the plantar
arch and tarsal degenerative changes are seen. Air is noted within
the soft tissues in the previous amputation site consistent with the
given clinical history of nonhealing wound. No definitive erosive
changes to suggest osteomyelitis are noted.
IMPRESSION: Chronic bony changes without acute abnormality.

Soft tissue gas is noted consistent with the nonhealing wound. No
definitive osteomyelitis is seen.
